# Patient Record
Sex: Male | Born: 1935 | Race: White | Hispanic: No | Smoking: Former smoker
Health system: Southern US, Community
[De-identification: ages and names within clinical notes are randomized; demographics above are authoritative.]

## PROBLEM LIST (undated history)

## (undated) DIAGNOSIS — N189 Chronic kidney disease, unspecified: Secondary | ICD-10-CM

## (undated) DIAGNOSIS — R001 Bradycardia, unspecified: Secondary | ICD-10-CM

## (undated) DIAGNOSIS — N200 Calculus of kidney: Secondary | ICD-10-CM

## (undated) DIAGNOSIS — F329 Major depressive disorder, single episode, unspecified: Secondary | ICD-10-CM

## (undated) DIAGNOSIS — N289 Disorder of kidney and ureter, unspecified: Secondary | ICD-10-CM

## (undated) DIAGNOSIS — C801 Malignant (primary) neoplasm, unspecified: Secondary | ICD-10-CM

## (undated) DIAGNOSIS — Z936 Other artificial openings of urinary tract status: Secondary | ICD-10-CM

## (undated) DIAGNOSIS — F32A Depression, unspecified: Secondary | ICD-10-CM

## (undated) DIAGNOSIS — C61 Malignant neoplasm of prostate: Secondary | ICD-10-CM

## (undated) DIAGNOSIS — I82409 Acute embolism and thrombosis of unspecified deep veins of unspecified lower extremity: Secondary | ICD-10-CM

## (undated) DIAGNOSIS — I10 Essential (primary) hypertension: Secondary | ICD-10-CM

## (undated) DIAGNOSIS — I350 Nonrheumatic aortic (valve) stenosis: Secondary | ICD-10-CM

## (undated) DIAGNOSIS — H3530 Unspecified macular degeneration: Secondary | ICD-10-CM

## (undated) HISTORY — DX: Acute embolism and thrombosis of unspecified deep veins of unspecified lower extremity: I82.409

## (undated) HISTORY — PX: CATARACT EXTRACTION, BILATERAL: SHX1313

## (undated) HISTORY — DX: Essential (primary) hypertension: I10

## (undated) HISTORY — DX: Chronic kidney disease, unspecified: N18.9

## (undated) HISTORY — PX: PROSTATE SURGERY: SHX751

## (undated) HISTORY — DX: Malignant neoplasm of prostate: C61

## (undated) HISTORY — PX: LYMPHADENECTOMY: SHX15

## (undated) HISTORY — DX: Major depressive disorder, single episode, unspecified: F32.9

## (undated) HISTORY — DX: Depression, unspecified: F32.A

## (undated) HISTORY — PX: OTHER SURGICAL HISTORY: SHX169

## (undated) HISTORY — PX: KIDNEY STONE SURGERY: SHX686

## (undated) HISTORY — DX: Nonrheumatic aortic (valve) stenosis: I35.0

## (undated) HISTORY — PX: CYSTECTOMY: SHX5119

## (undated) HISTORY — PX: APPENDECTOMY: SHX54

---

## 2004-12-25 ENCOUNTER — Encounter (INDEPENDENT_AMBULATORY_CARE_PROVIDER_SITE_OTHER): Payer: Self-pay | Admitting: Specialist

## 2004-12-27 ENCOUNTER — Inpatient Hospital Stay (HOSPITAL_COMMUNITY): Admission: RE | Admit: 2004-12-27 | Discharge: 2004-12-28 | Payer: Self-pay | Admitting: Urology

## 2005-01-30 ENCOUNTER — Inpatient Hospital Stay (HOSPITAL_COMMUNITY): Admission: RE | Admit: 2005-01-30 | Discharge: 2005-02-11 | Payer: Self-pay | Admitting: Urology

## 2005-01-30 ENCOUNTER — Encounter (INDEPENDENT_AMBULATORY_CARE_PROVIDER_SITE_OTHER): Payer: Self-pay | Admitting: Specialist

## 2005-06-04 ENCOUNTER — Ambulatory Visit (HOSPITAL_COMMUNITY): Admission: RE | Admit: 2005-06-04 | Discharge: 2005-06-04 | Payer: Self-pay | Admitting: Urology

## 2005-12-18 ENCOUNTER — Ambulatory Visit (HOSPITAL_COMMUNITY): Admission: RE | Admit: 2005-12-18 | Discharge: 2005-12-18 | Payer: Self-pay | Admitting: Urology

## 2006-06-25 ENCOUNTER — Ambulatory Visit (HOSPITAL_COMMUNITY): Admission: RE | Admit: 2006-06-25 | Discharge: 2006-06-25 | Payer: Self-pay | Admitting: Urology

## 2006-10-12 IMAGING — CR DG CHEST 2V
2 series · 2 of 2 positions shown · non-contrast
Comparison: None.

CLINICAL DATA: 83-year-old, bladder tumor, preoperative respiratory exam. 
 CHEST ? 2 VIEW:

[view not recorded (1 of 2)]
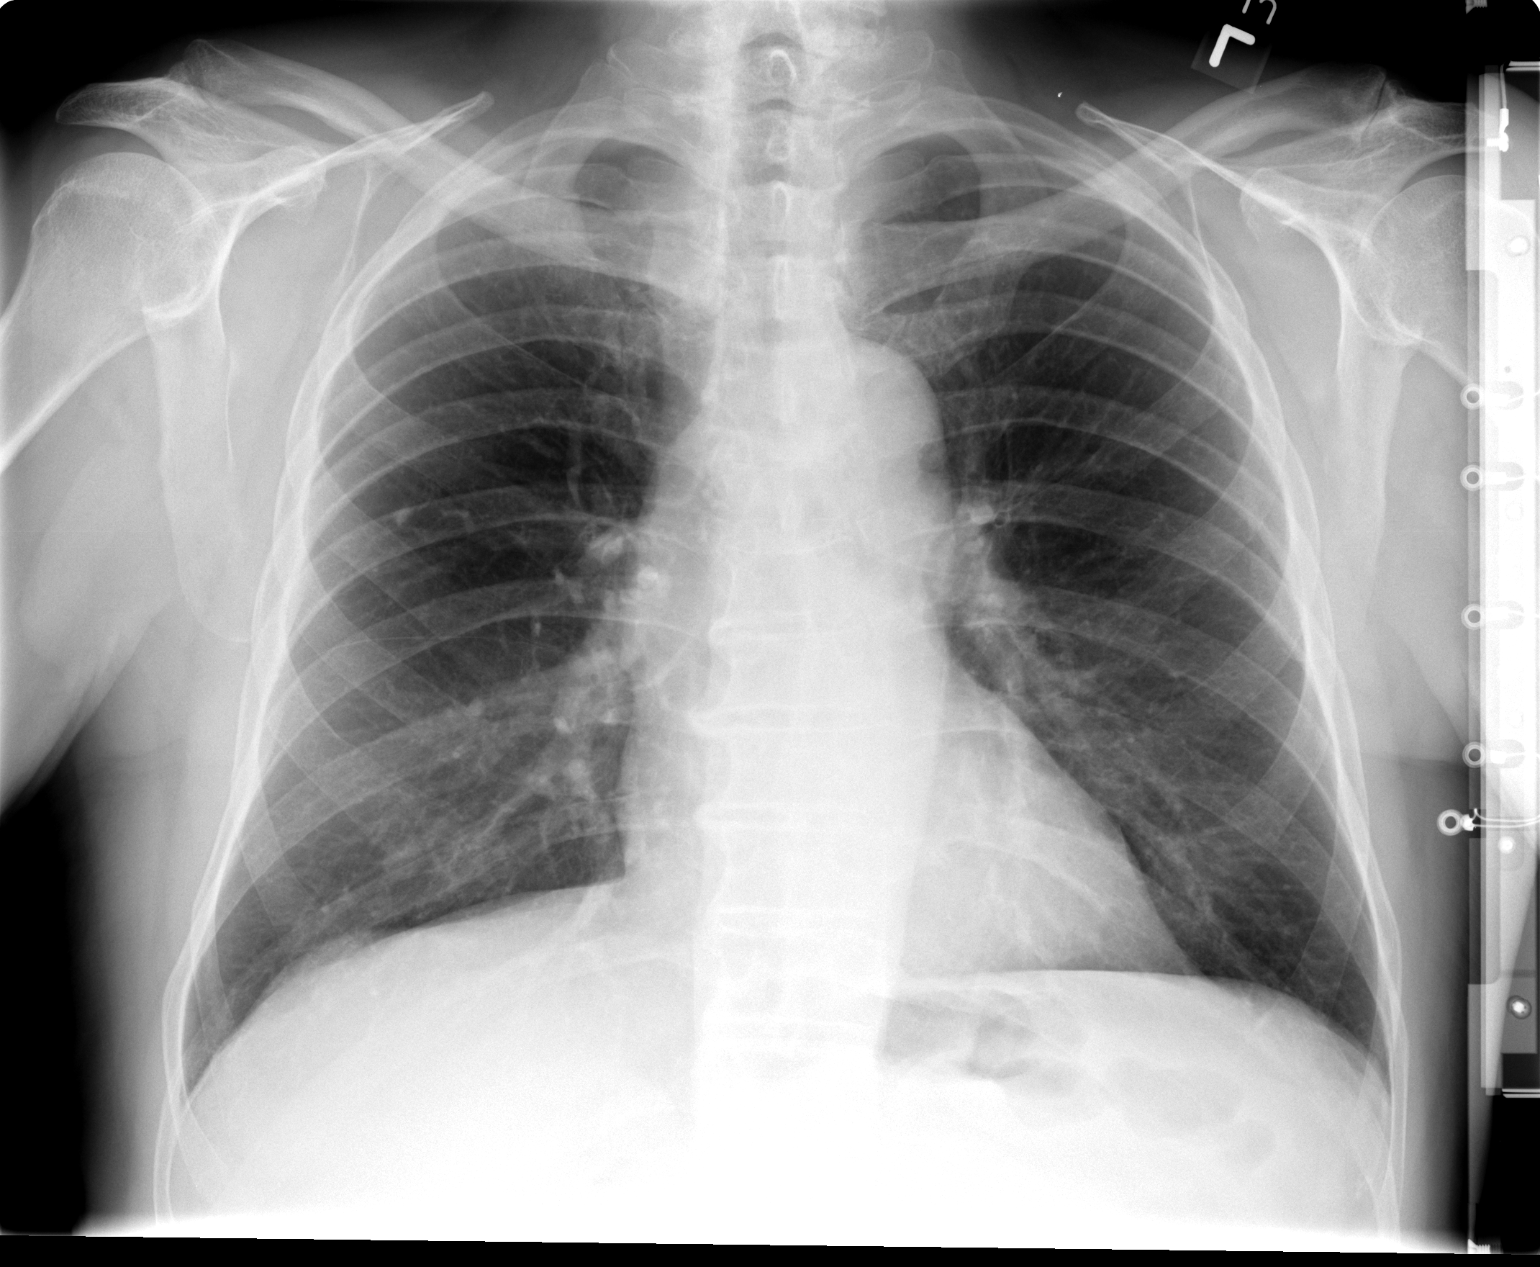

[view not recorded (2 of 2)]
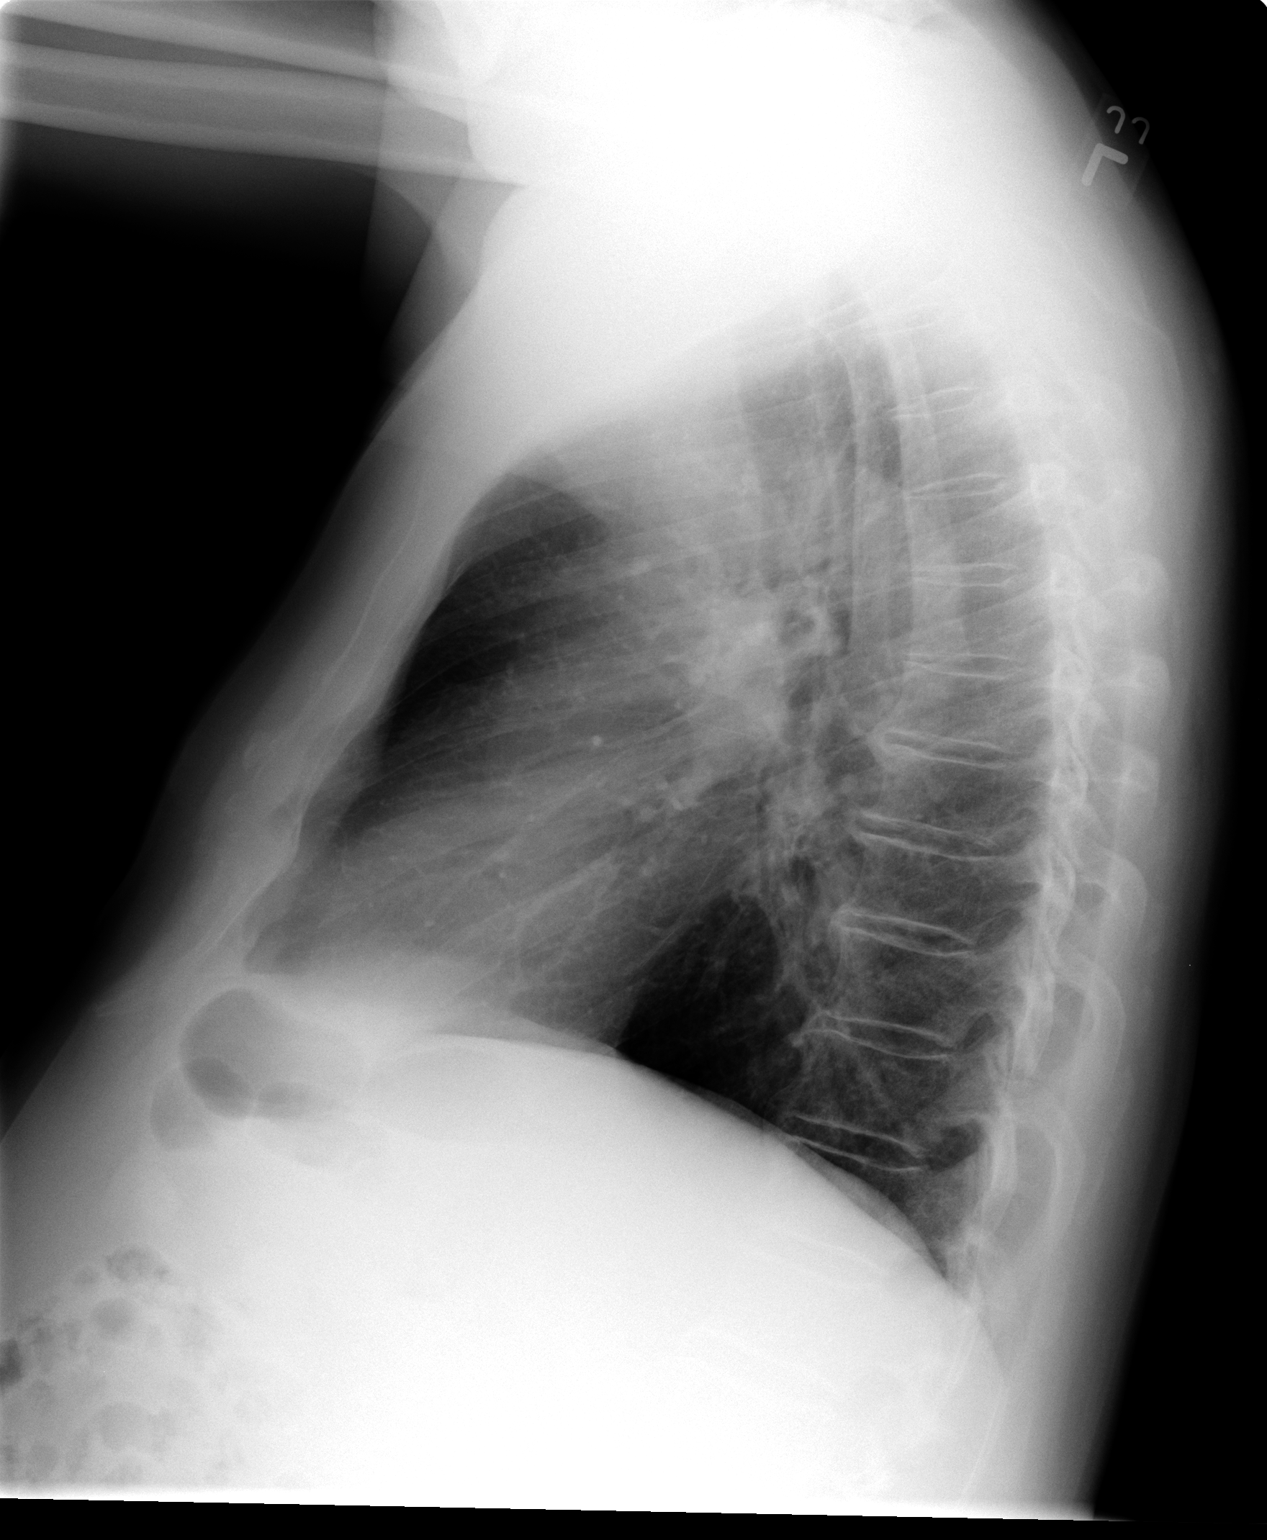

[2 of 2 positions shown; findings below may reference images not displayed]

FINDINGS: The cardiac silhouette, mediastinal contours are within normal limits.  There is mild tortuosity and ectasia of the thoracic aorta.  There are linear scarring type changes in the right mid lung along with mild right basilar scarring changes.  No infiltrates, edema or effusions.  No worrisome pulmonary masses.  Mild degenerative changes in the thoracic spine and involving the AC joints.  Rounded nodular densities in both lower lung zones are symmetric in appearance and position and are consistent with nipple shadows.
IMPRESSION: No acute cardiopulmonary findings.  No worrisome pulmonary lesions.  Right mid lung and right basilar scarring type changes.

## 2006-11-18 IMAGING — CR DG CHEST 1V PORT
1 series · 1 of 1 positions shown · non-contrast
Comparison: Chest radiograph 12/24/04.

CLINICAL DATA: Central line placement, bladder cancer.
 PORTABLE [id number] VIEW:

[view not recorded]
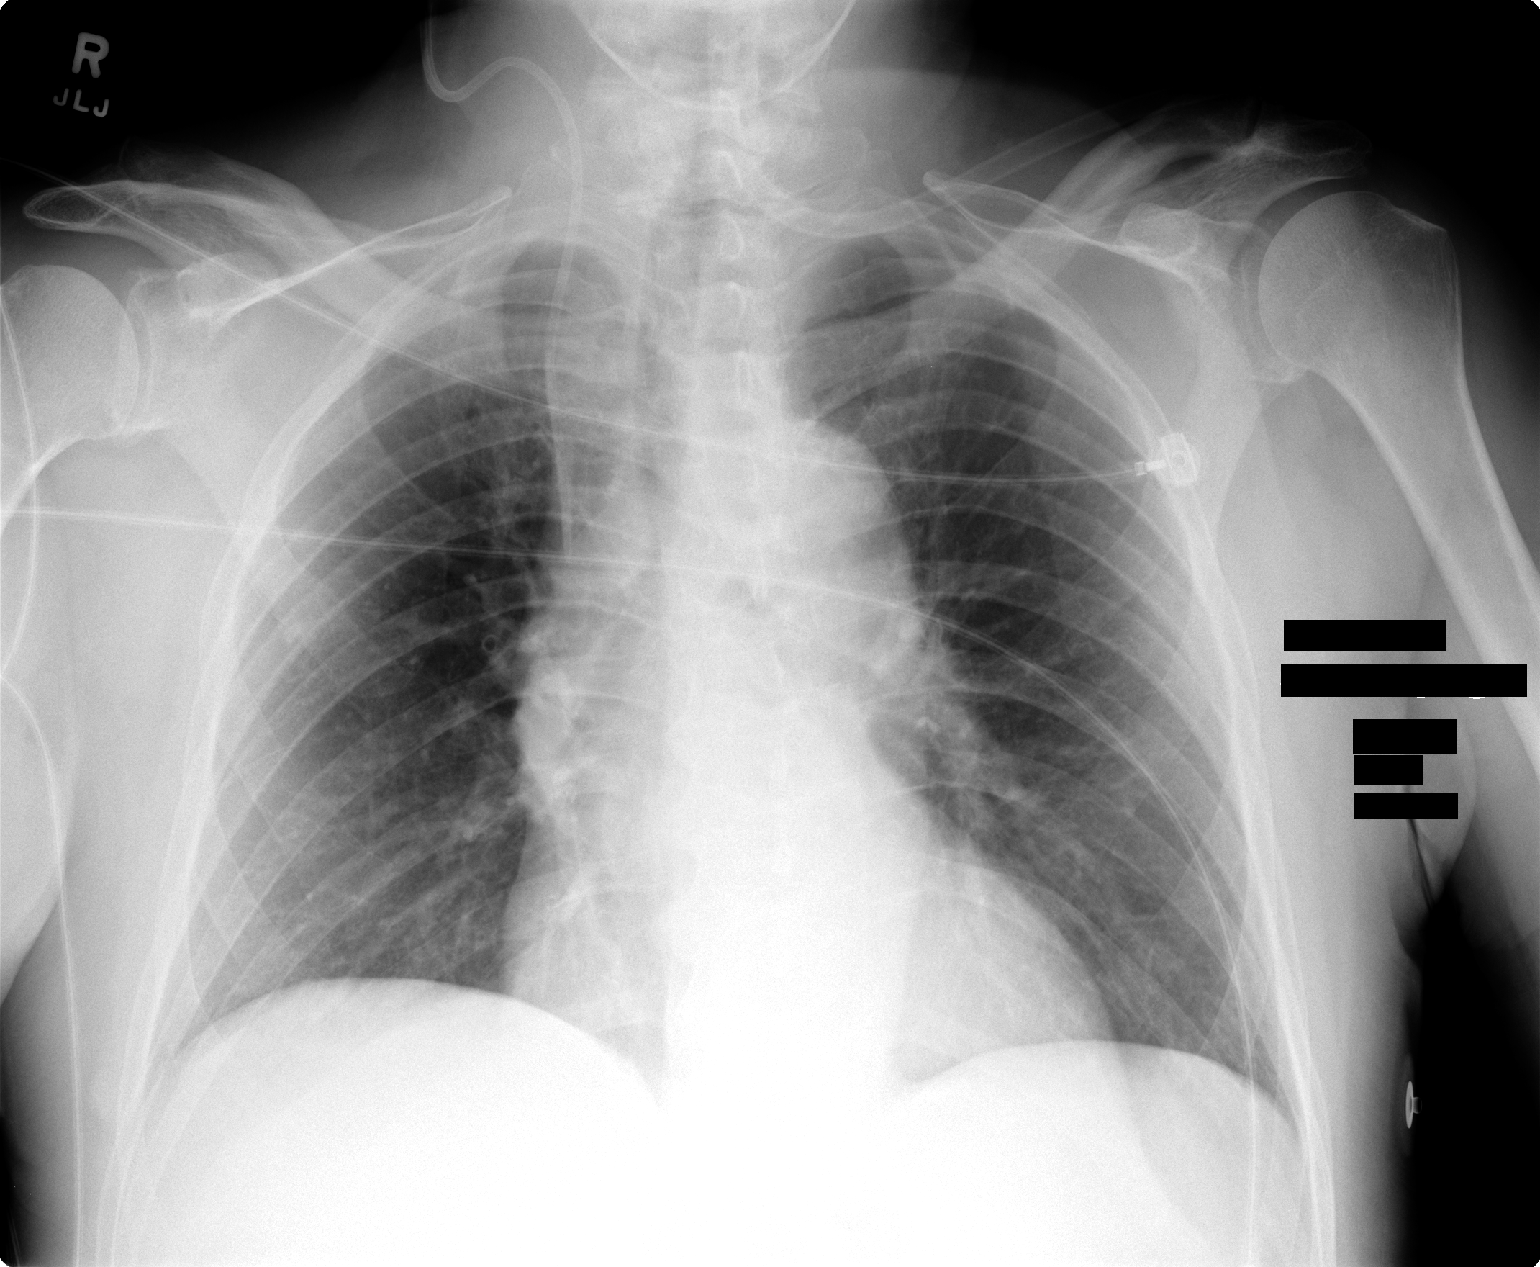

[1 of 1 positions shown; findings below may reference images not displayed]

FINDINGS: There has been interval placement of a right IJ central venous catheter terminating over the SVC.  No acute finding is otherwise identified.  No change.
IMPRESSION: No pneumothorax status post right IJ central line placement with tip over SVC.

## 2006-11-26 IMAGING — CR DG CHEST 1V PORT
1 series · 1 of 1 positions shown · non-contrast
Comparison: Portable chest x-ray 01/30/2005.

CLINICAL DATA: Bedside PICC placement. History of bladder cancer.

PORTABLE CHEST - 1 VIEW  [DATE]/[id number] hours:

[view not recorded]
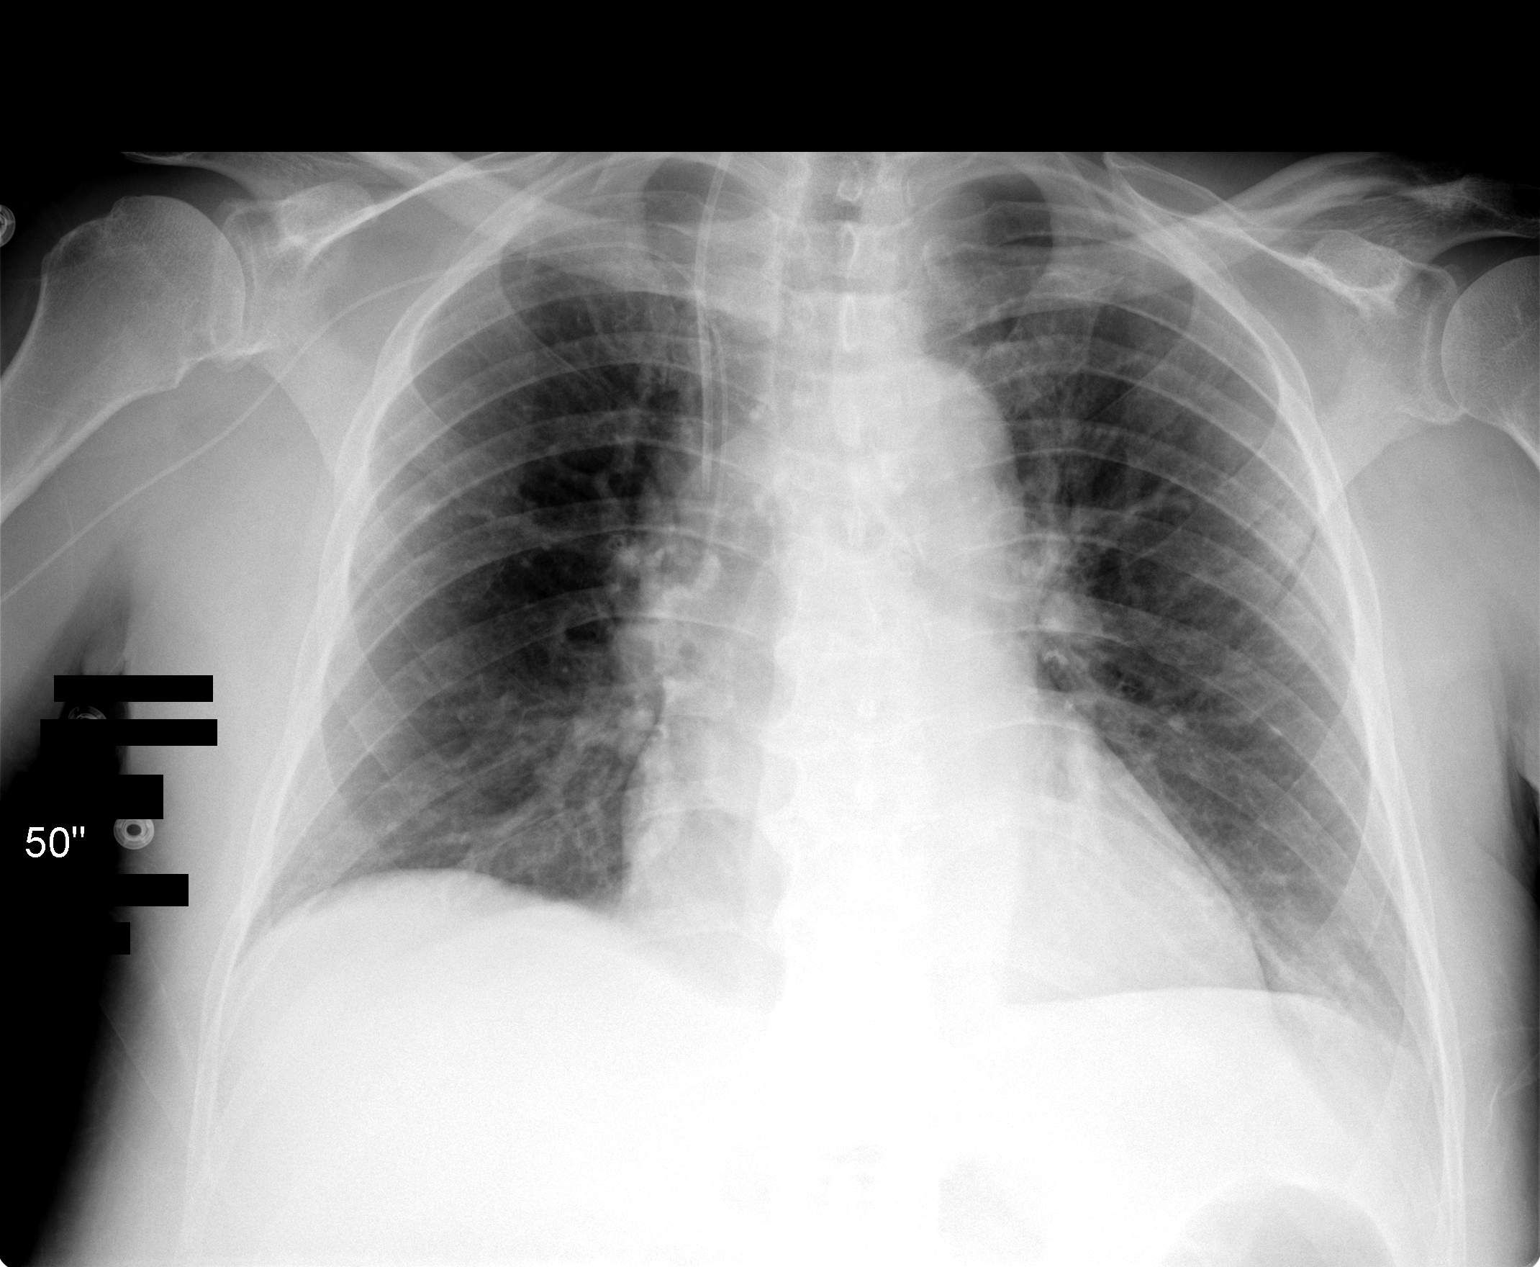

[1 of 1 positions shown; findings below may reference images not displayed]

FINDINGS: The right arm PICC has its tip in the lower SVC. The right jugular
central venous catheter tip remains in the upper SVC. Mild atelectasis is
present in the lung bases. The lungs are otherwise clear. The heart size is
upper normal to perhaps slightly enlarged but stable.
IMPRESSION: 1. Right arm PICC tip in the lower SVC.
2. Mild bibasilar atelectasis.

## 2006-12-30 ENCOUNTER — Ambulatory Visit (HOSPITAL_COMMUNITY): Admission: RE | Admit: 2006-12-30 | Discharge: 2006-12-30 | Payer: Self-pay | Admitting: Urology

## 2007-03-26 HISTORY — PX: PACEMAKER INSERTION: SHX728

## 2007-08-29 ENCOUNTER — Inpatient Hospital Stay (HOSPITAL_COMMUNITY): Admission: EM | Admit: 2007-08-29 | Discharge: 2007-09-06 | Payer: Self-pay | Admitting: Emergency Medicine

## 2007-10-06 IMAGING — CR DG CHEST 2V
2 series · 2 of 2 positions shown · non-contrast
Comparison: 06/04/05

CLINICAL DATA: History of bladder cancer.  
 NBEHG-P VIEWS:

[view not recorded (1 of 2)]
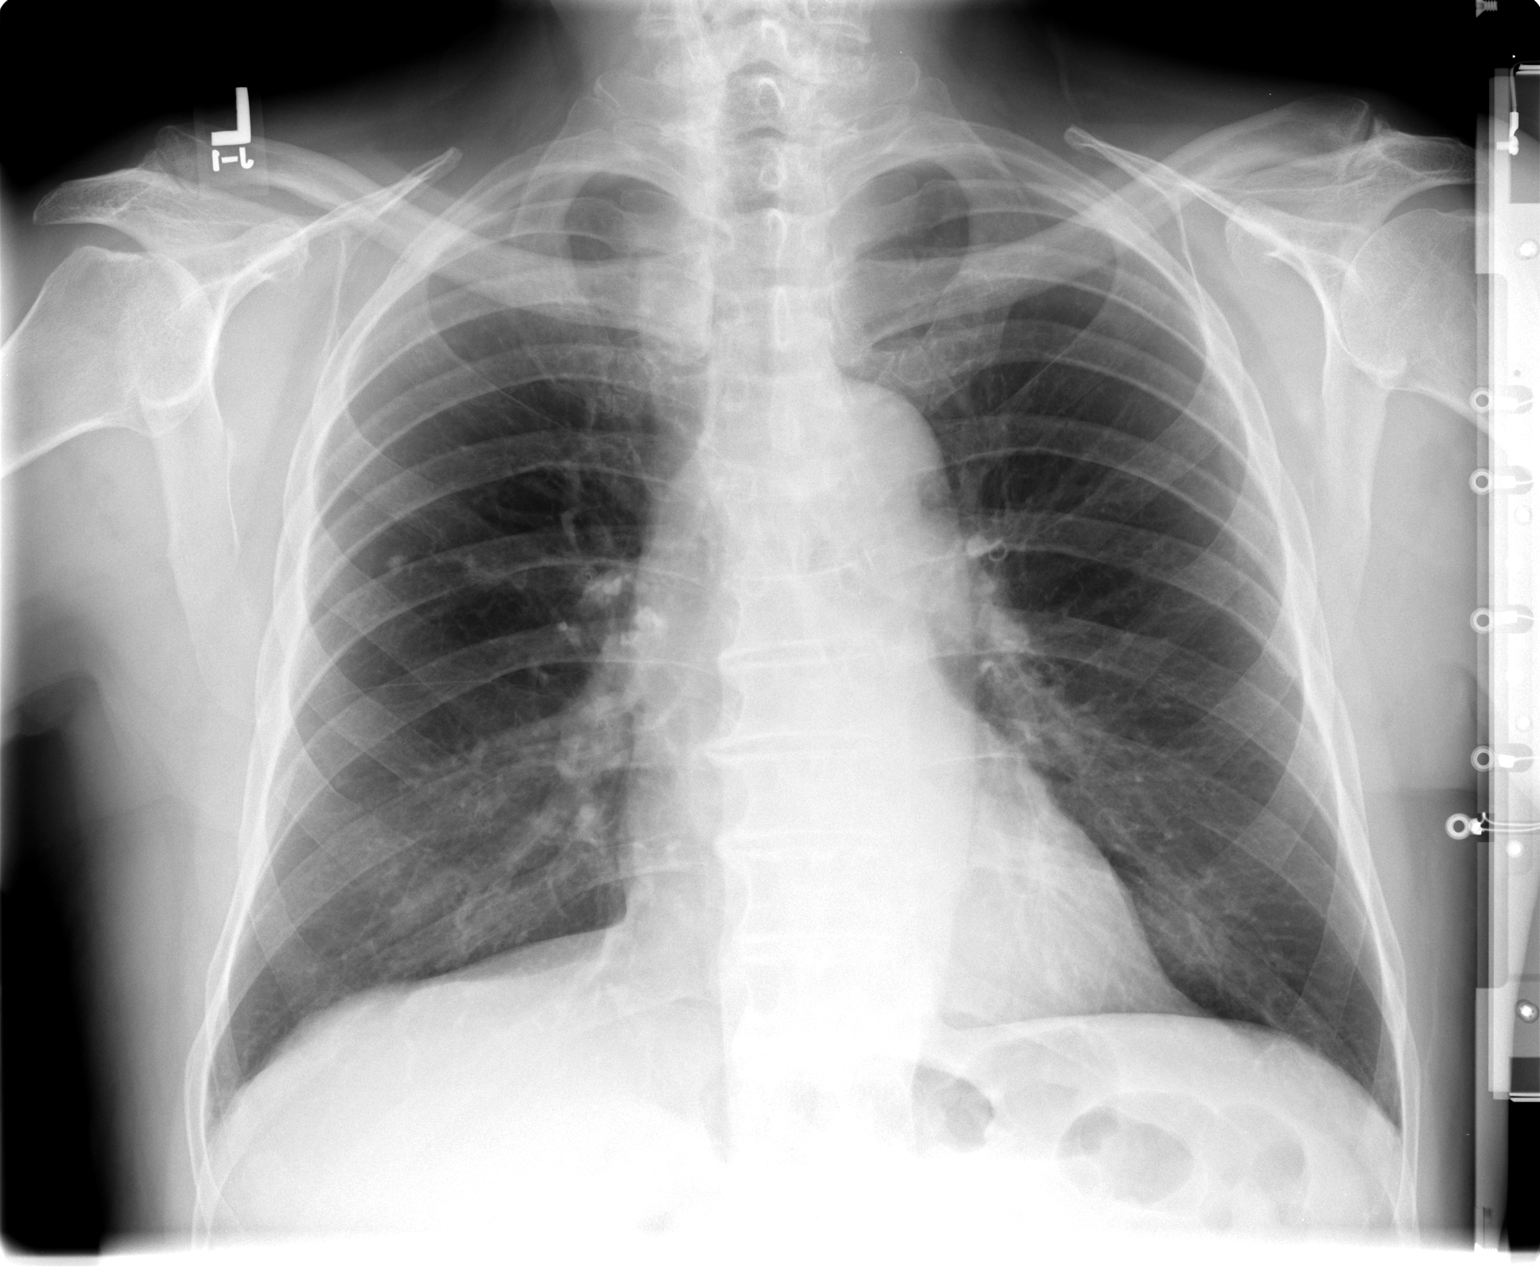

[view not recorded (2 of 2)]
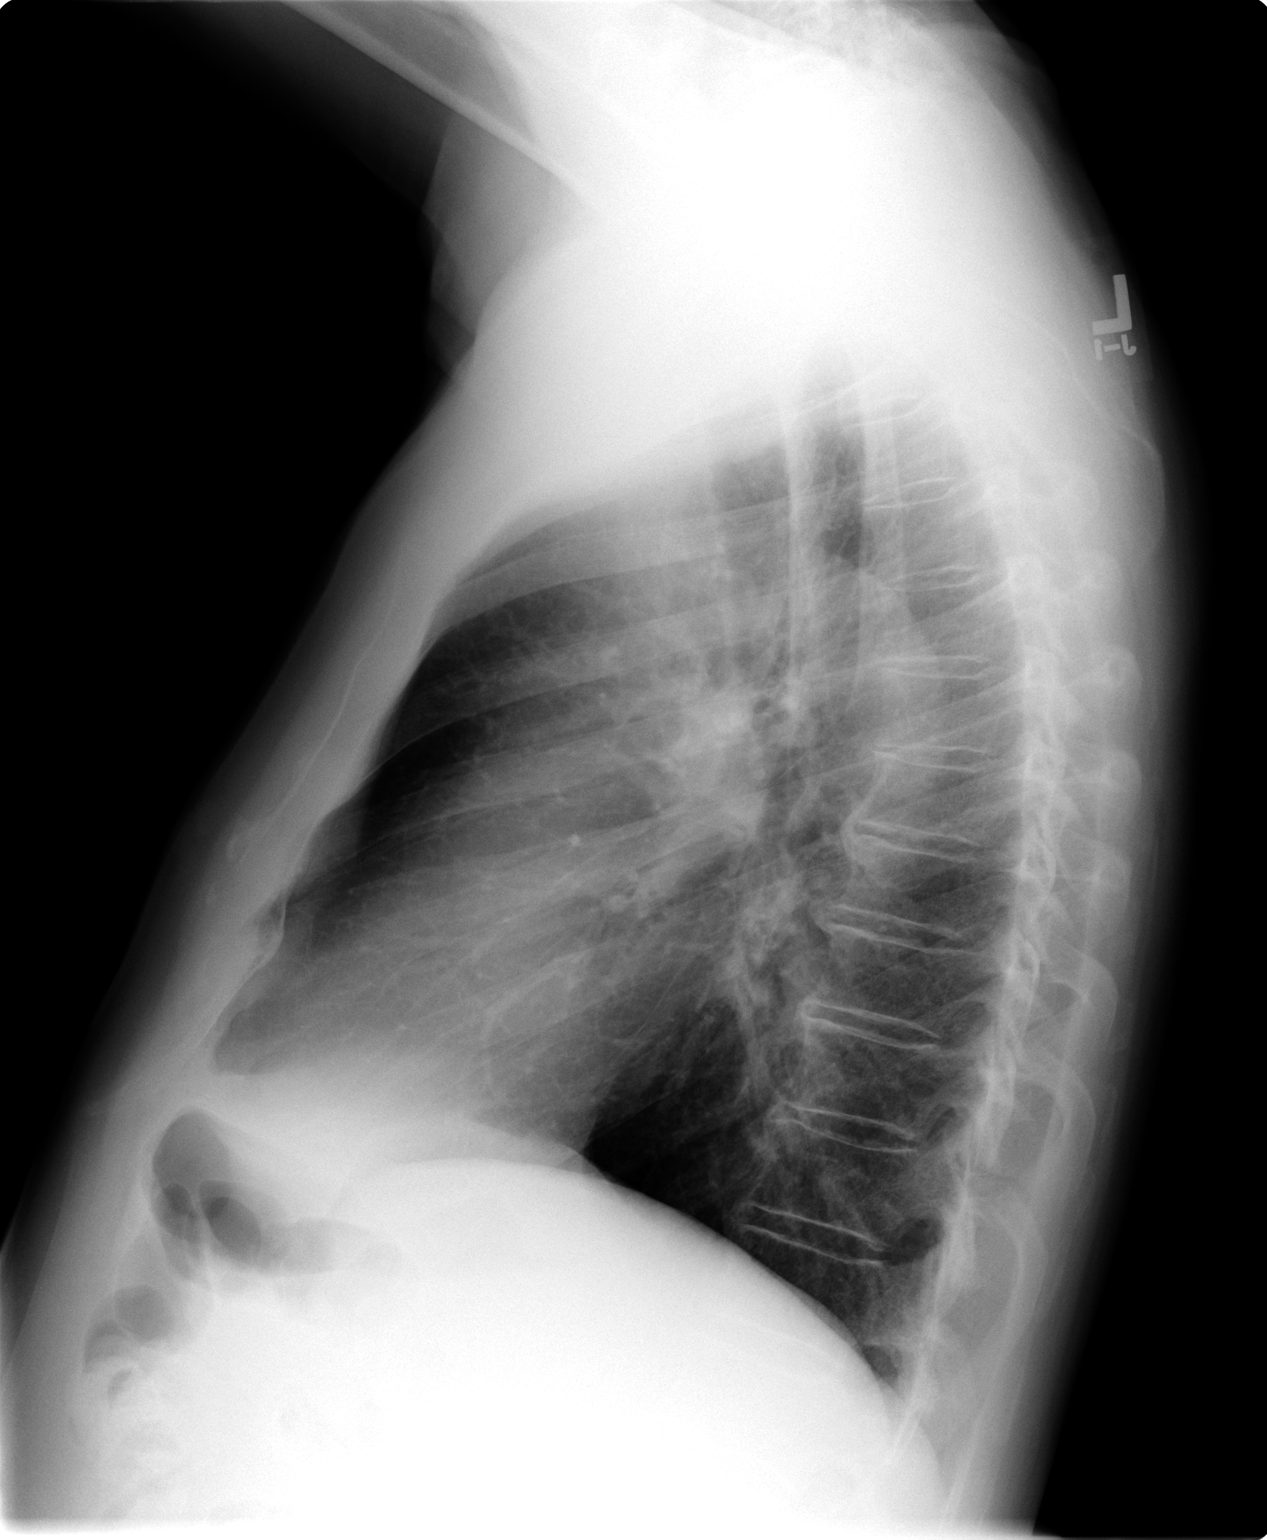

[2 of 2 positions shown; findings below may reference images not displayed]

FINDINGS: Calcified granuloma is again seen in the right midlung with calcified right hilar nodes also noted.  Somewhat prominent nipple shadows are also again noted as seen on study 12/24/04.  Lungs are otherwise unremarkable.  There is no pleural effusion.  Heart size is normal.
IMPRESSION: No acute disease with evidence of old granulomatous disease noted.

## 2007-12-10 ENCOUNTER — Ambulatory Visit (HOSPITAL_COMMUNITY): Admission: RE | Admit: 2007-12-10 | Discharge: 2007-12-11 | Payer: Self-pay | Admitting: *Deleted

## 2007-12-22 ENCOUNTER — Ambulatory Visit: Payer: Self-pay | Admitting: *Deleted

## 2008-02-24 ENCOUNTER — Ambulatory Visit: Payer: Self-pay | Admitting: Vascular Surgery

## 2008-04-04 ENCOUNTER — Ambulatory Visit (HOSPITAL_COMMUNITY): Admission: RE | Admit: 2008-04-04 | Discharge: 2008-04-04 | Payer: Self-pay | Admitting: Urology

## 2008-04-12 IMAGING — CR DG CHEST 2V
2 series · 2 of 2 positions shown · non-contrast
Comparison: 12/18/05.

CLINICAL DATA: Bladder CA. 
 CHEST ? 2 VIEW:

[w chest pa]
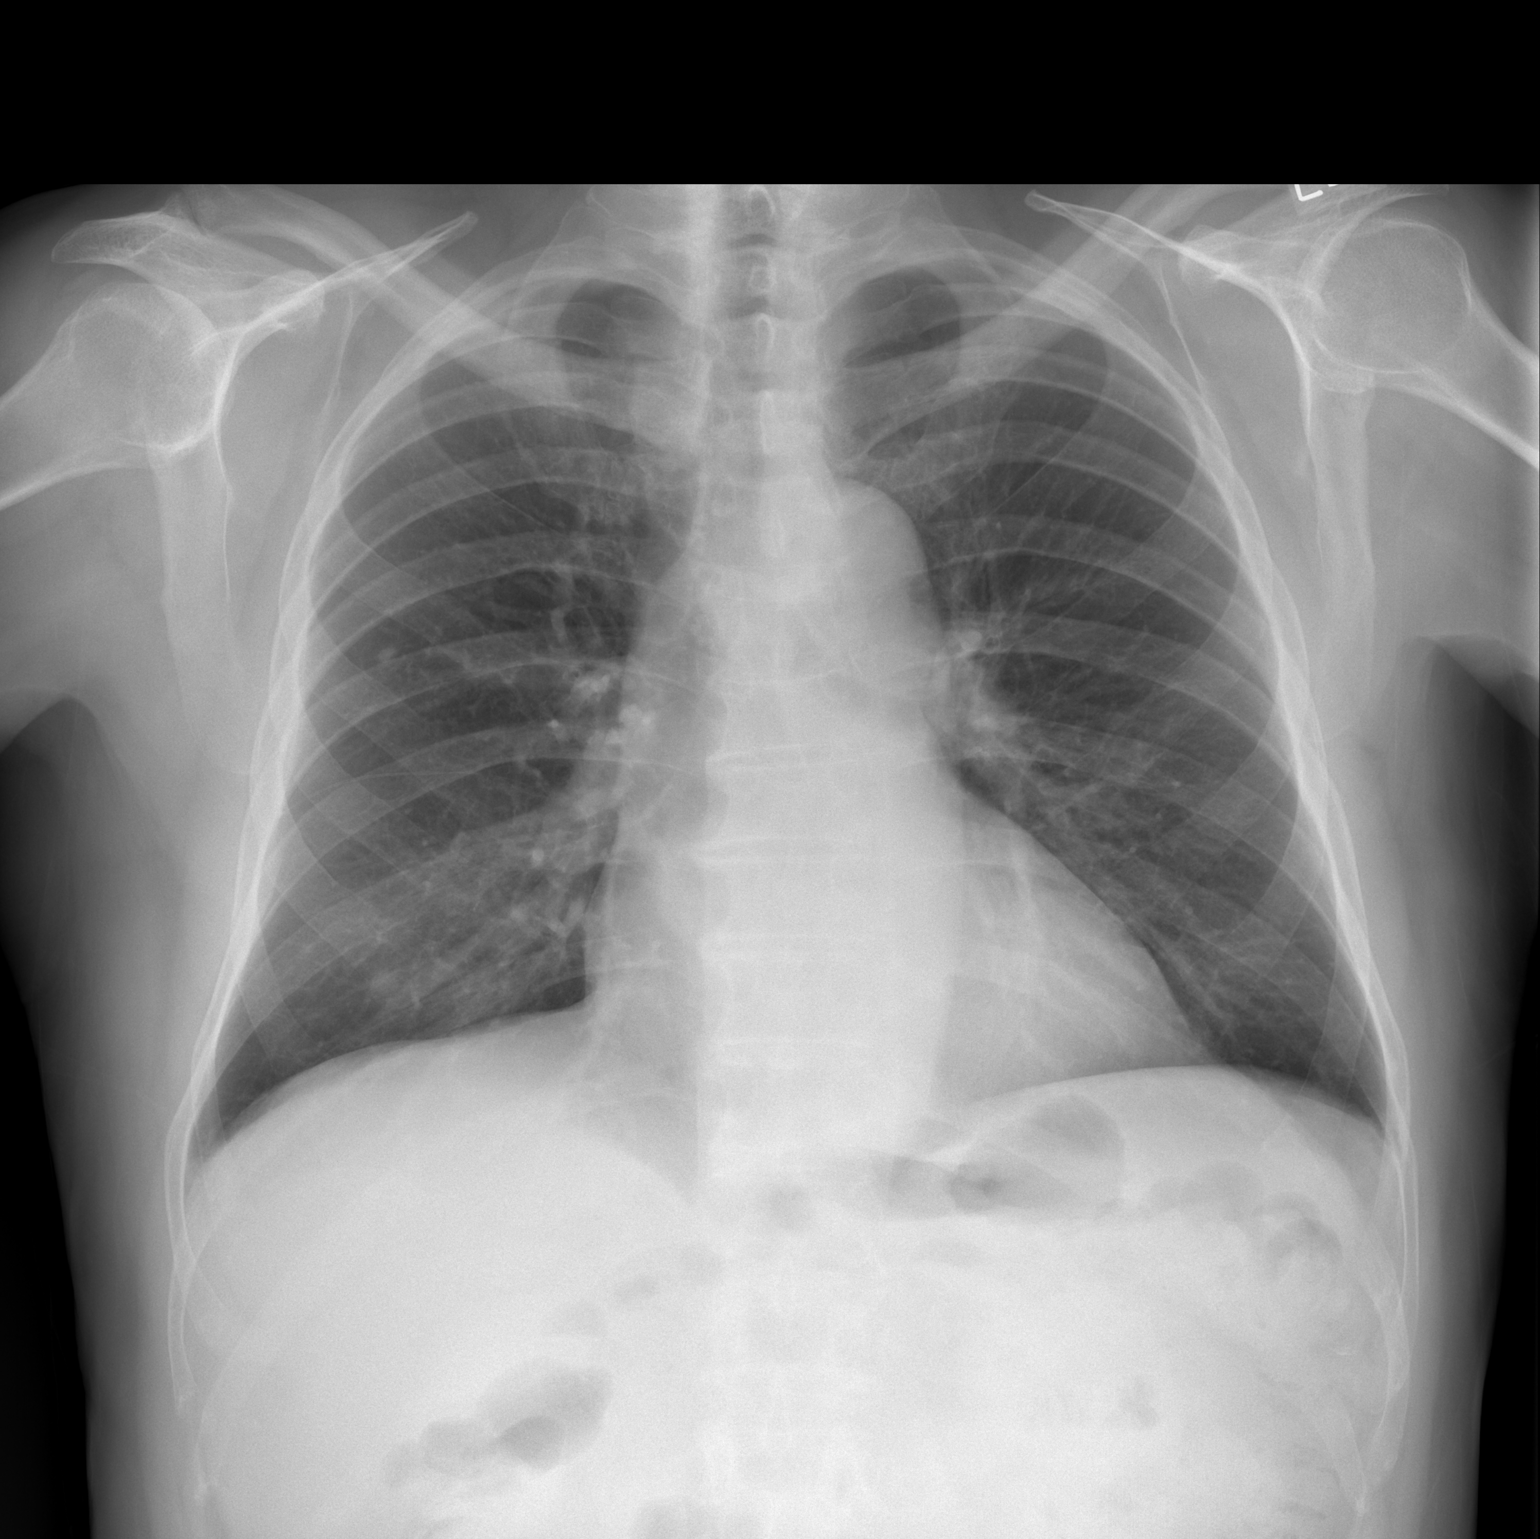

[w chest lat]
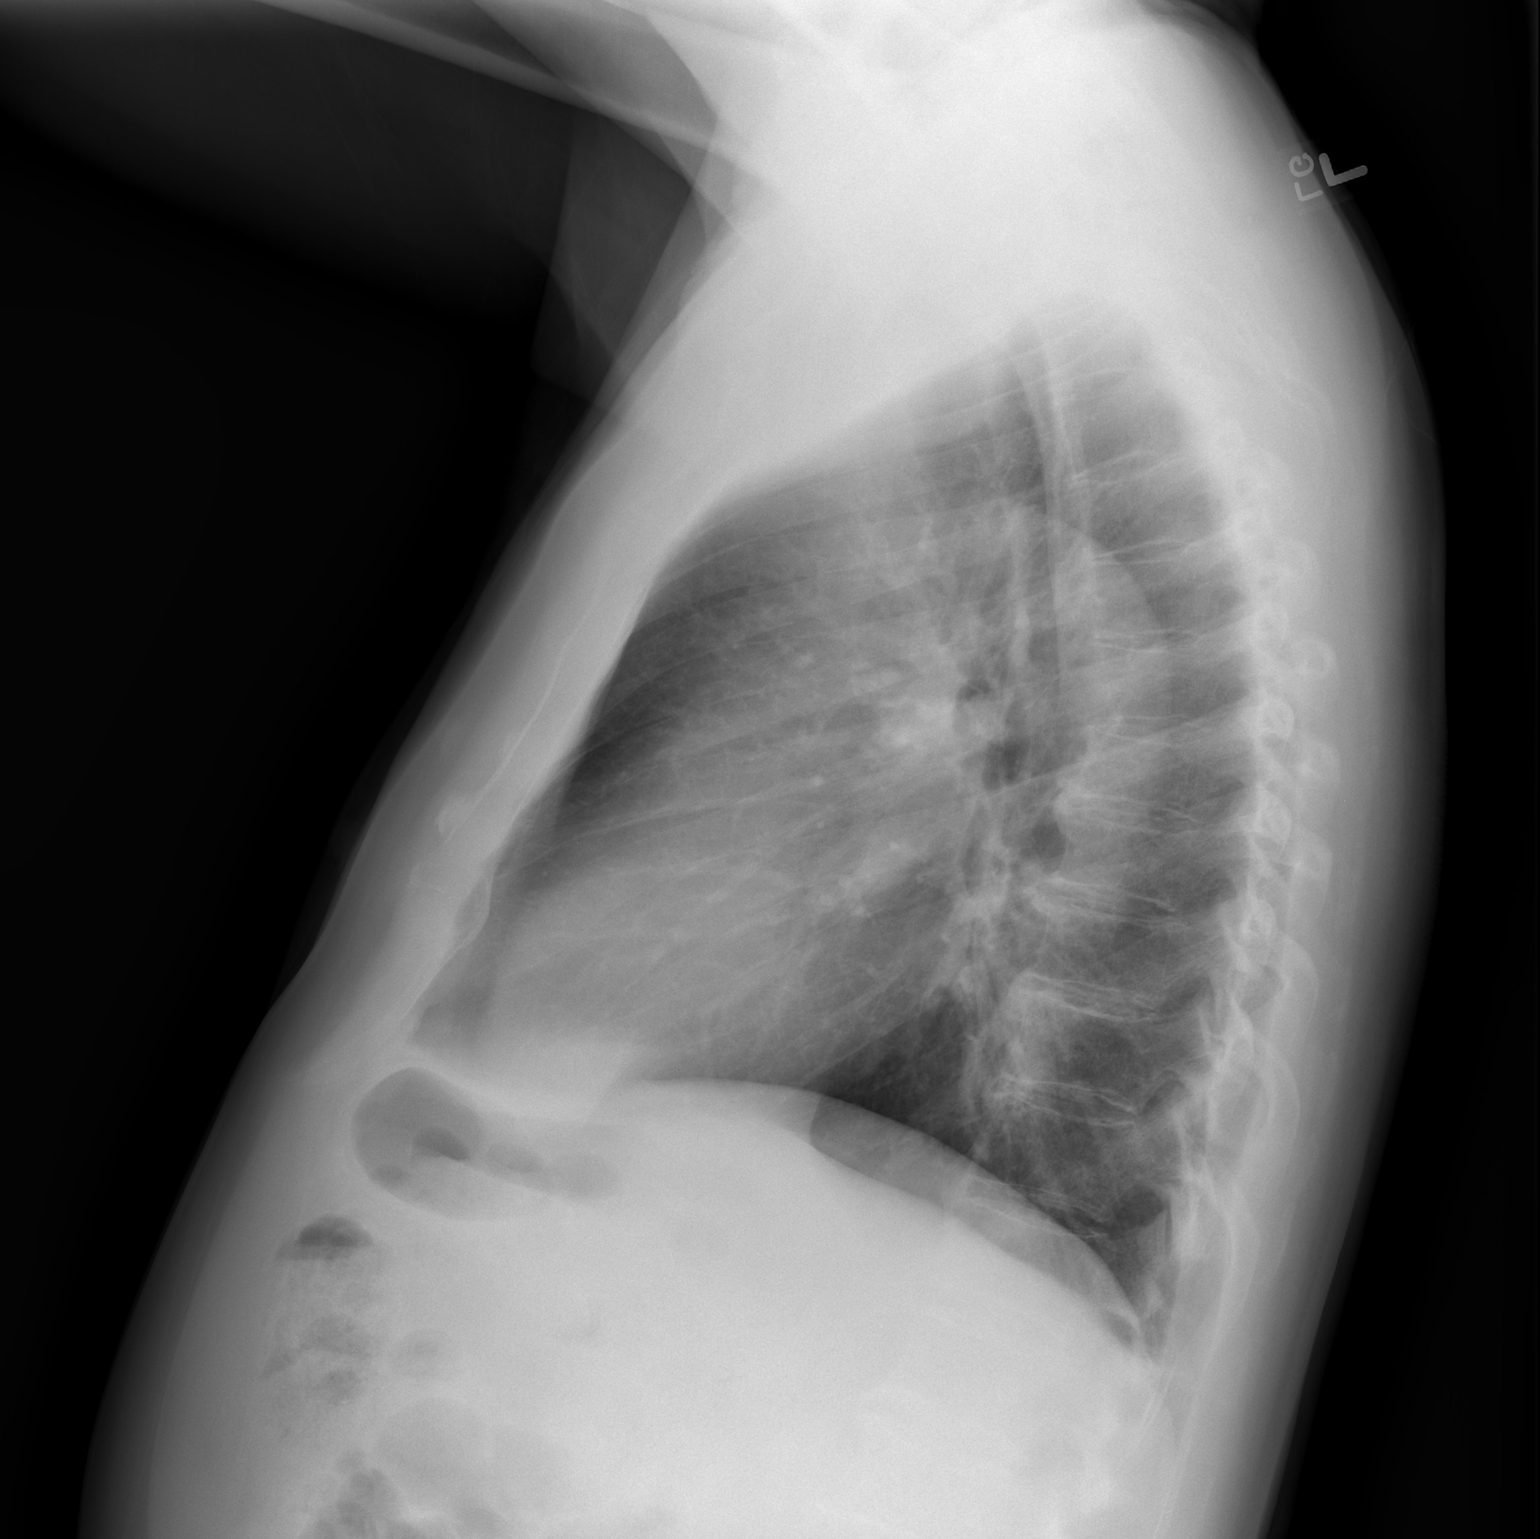

[2 of 2 positions shown; findings below may reference images not displayed]

FINDINGS: The heart size and mediastinal contours are within normal limits.  Both lungs are clear.  The visualized skeletal structures are unremarkable.  There are calcifications in the right perihilar region and right midlung zones secondary to old granulomatous disease.
IMPRESSION: No active chest disease.  Old granulomatous disease.

## 2008-06-03 ENCOUNTER — Ambulatory Visit: Payer: Self-pay | Admitting: Vascular Surgery

## 2008-10-03 ENCOUNTER — Ambulatory Visit: Payer: Self-pay | Admitting: Vascular Surgery

## 2008-10-17 IMAGING — CR DG CHEST 2V
2 series · 2 of 2 positions shown · non-contrast
Comparison: none

CLINICAL DATA: 85-year-old male with bladder cancer. Preop for surgery. 
 CHEST - 2 VIEW:

[w chest pa]
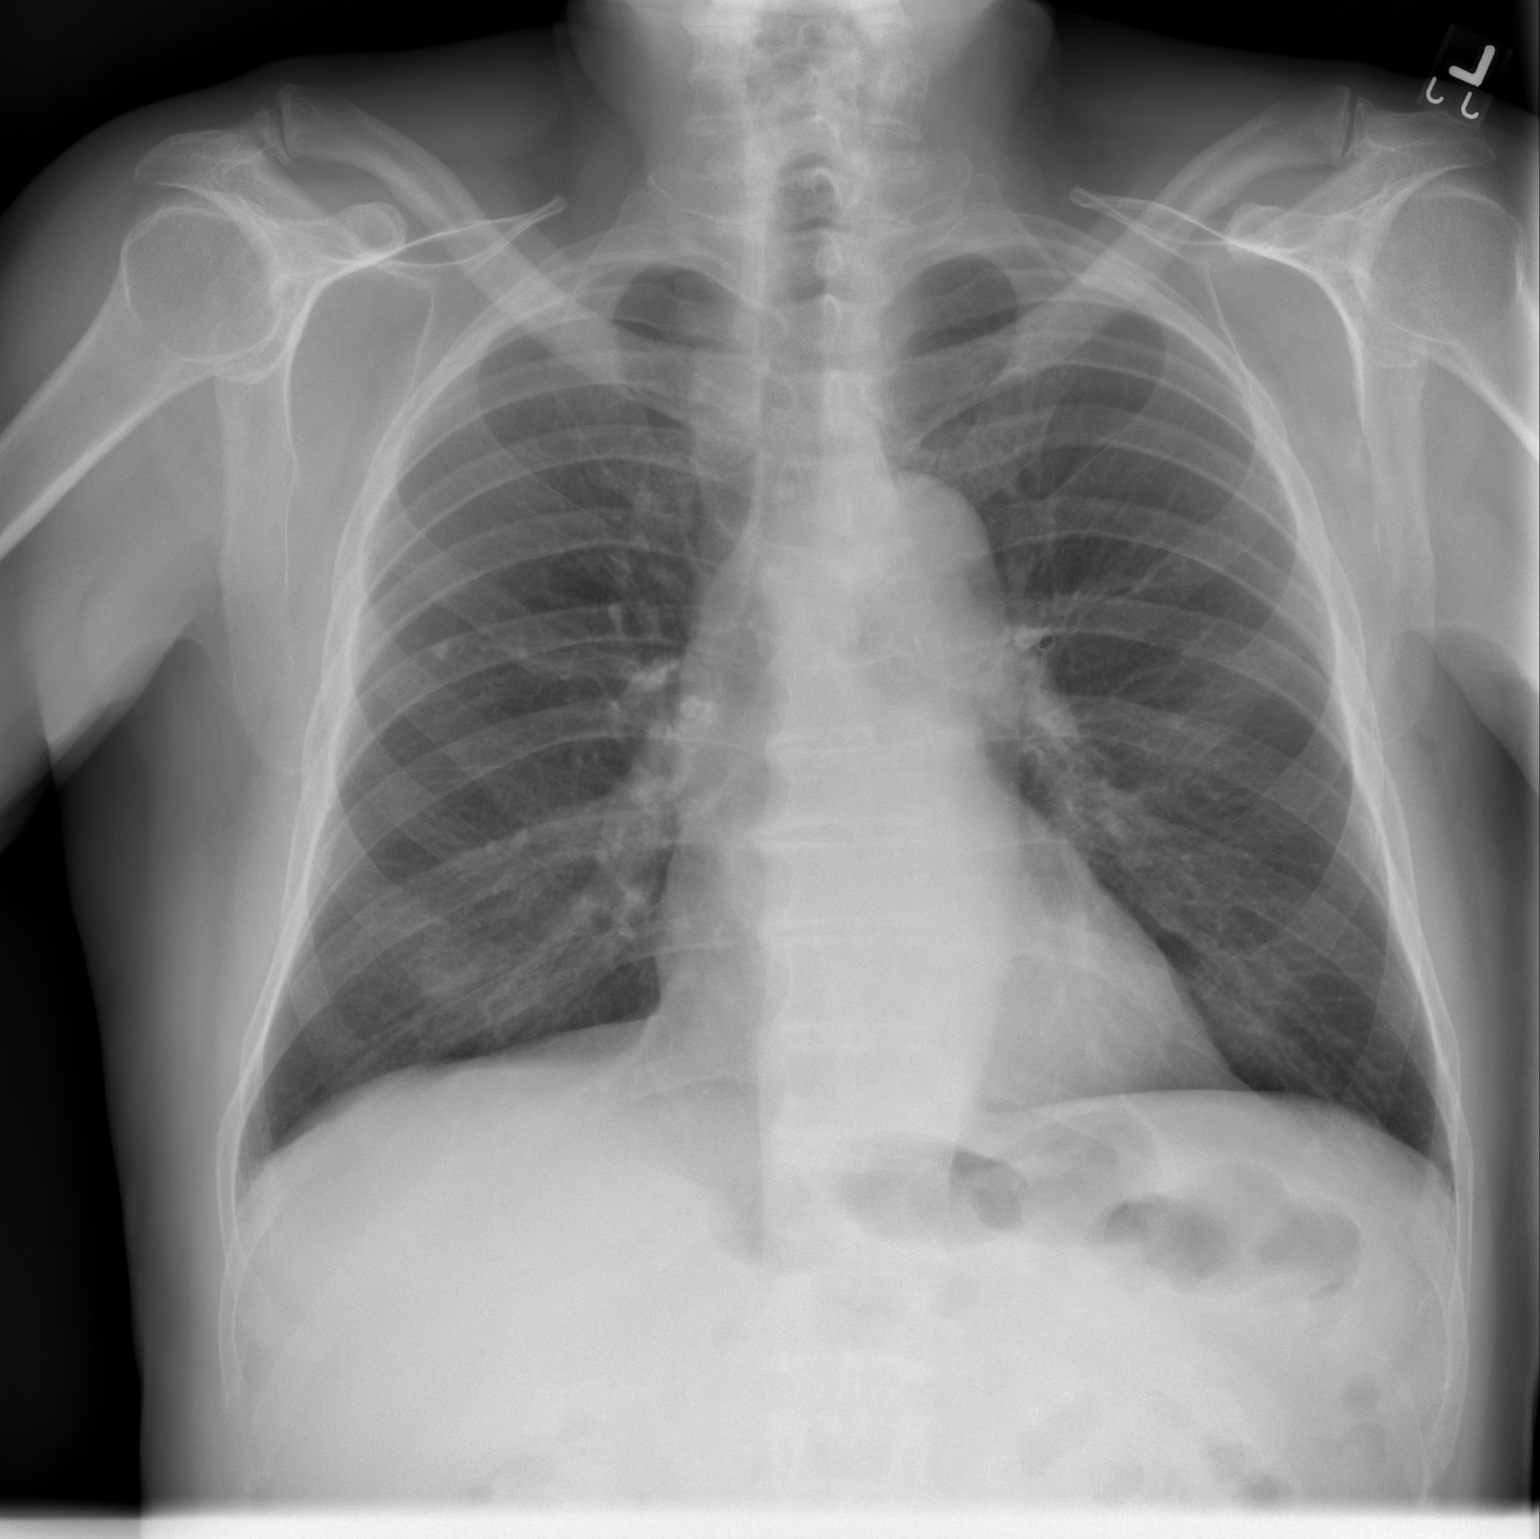

[w chest lat]
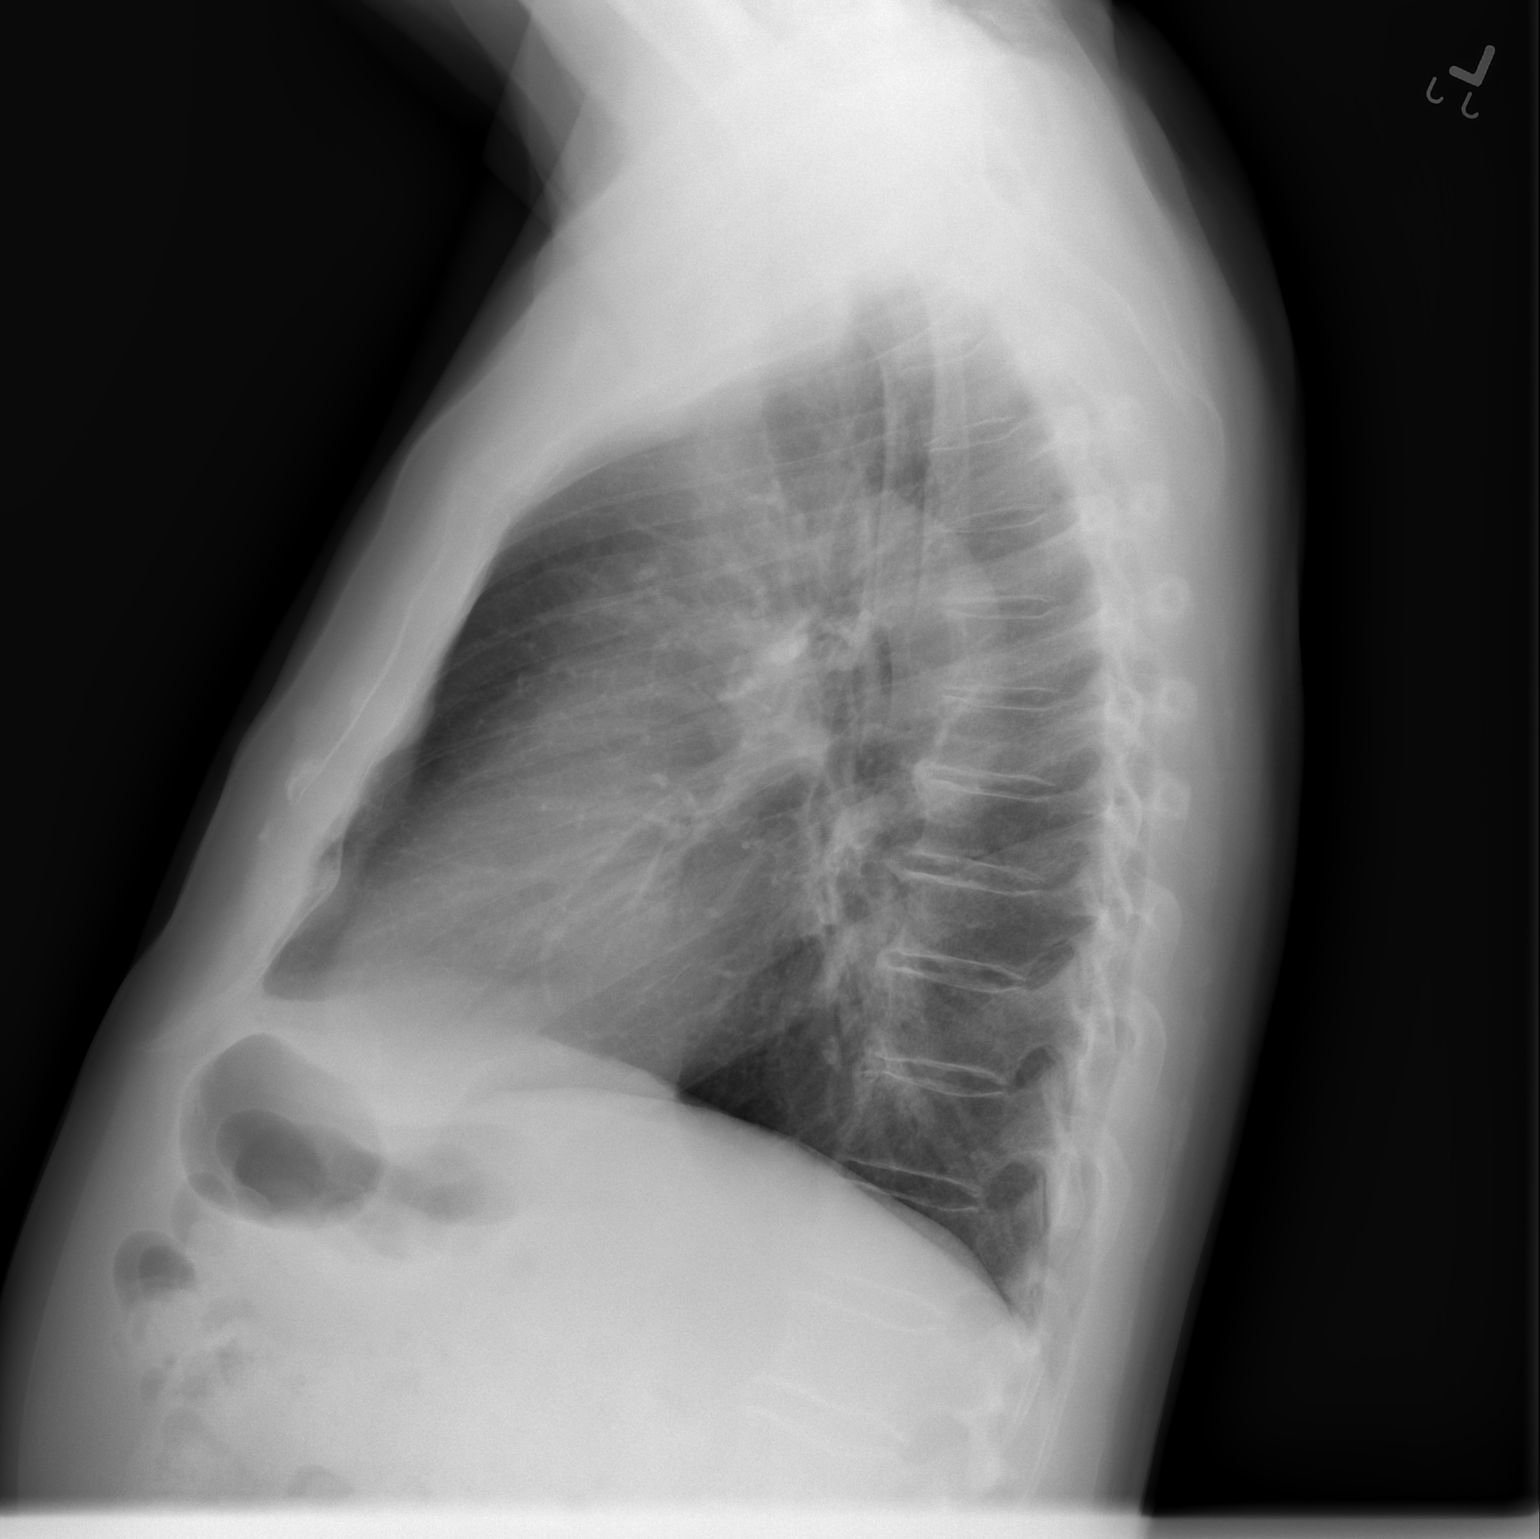

[2 of 2 positions shown; findings below may reference images not displayed]

FINDINGS: The cardiopericardial silhouette is within normal limits for size. The lungs are clear.  Granuloma of the right lung is stable. Mild degenerative changes of the AC joints are evident.
IMPRESSION: 1.   No acute cardiopulmonary disease. 
 2.  Stable granuloma right lung.

## 2009-05-15 ENCOUNTER — Ambulatory Visit (HOSPITAL_COMMUNITY): Admission: RE | Admit: 2009-05-15 | Discharge: 2009-05-15 | Payer: Self-pay | Admitting: Urology

## 2009-06-16 IMAGING — US US RENAL
1 series · 14 of 25 positions shown · non-contrast
Comparison: None.

CLINICAL DATA: Acute renal failure.  Dehydration.  Nausea and
vomiting for the past 3 weeks.  History of bladder and prostate
cancer.

RENAL/URINARY TRACT ULTRASOUND
TECHNIQUE: Complete ultrasound examination of the urinary tract
was performed including evaluation of the kidneys renal collecting
systems and urinary bladder.

[Series 1: unknown · 0.34mm/px · 14 of 51 slices shown]
[im 1/51]
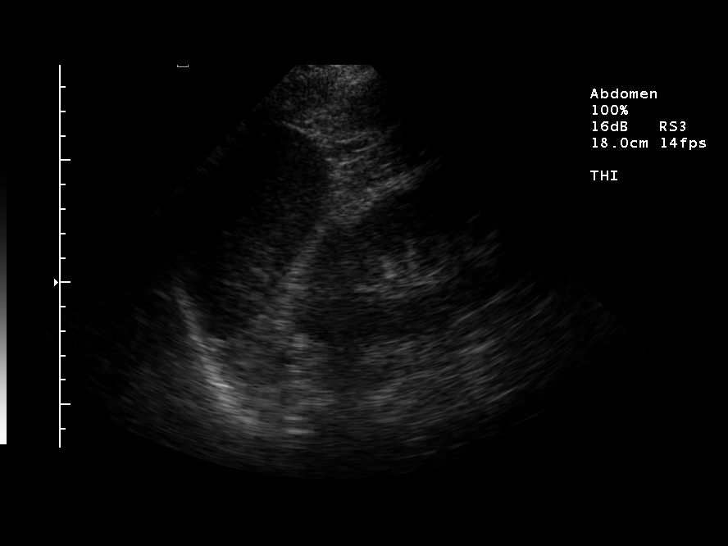
[im 5/51]
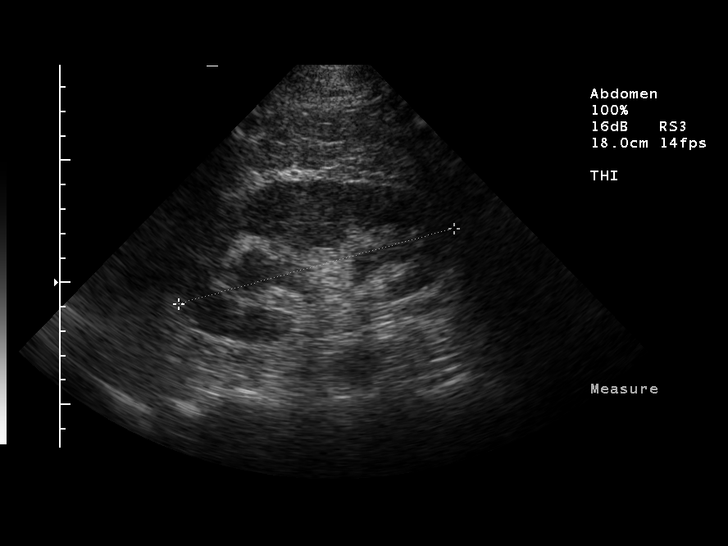
[im 9/51]
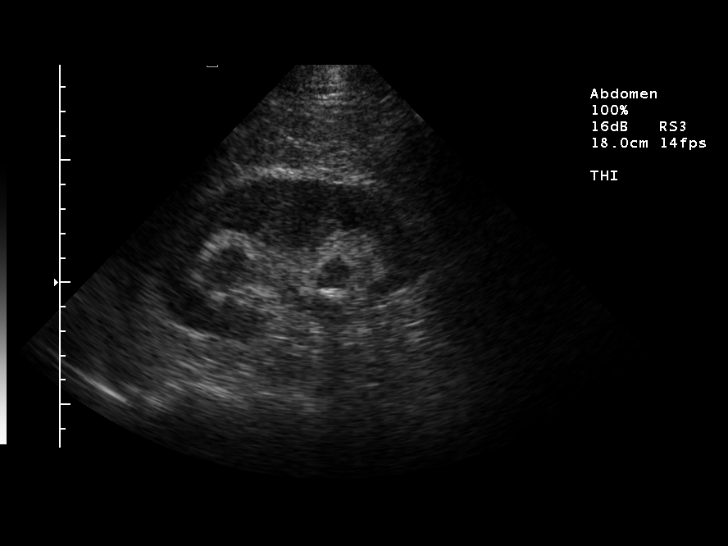
[im 13/51]
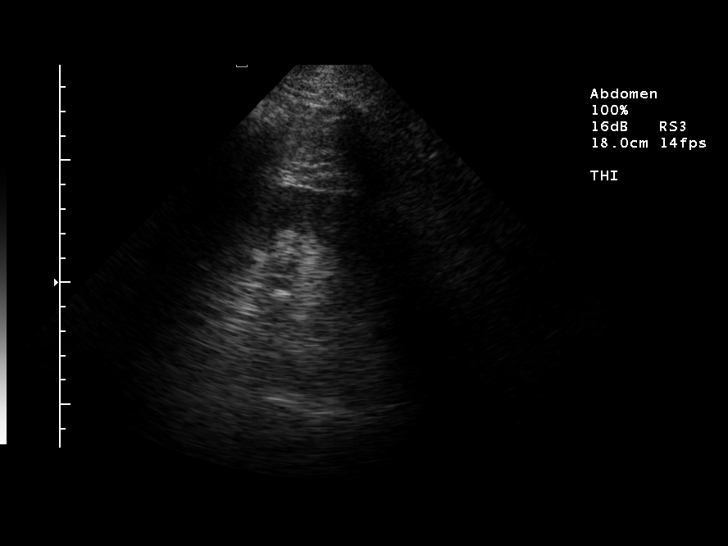
[im 17/51]
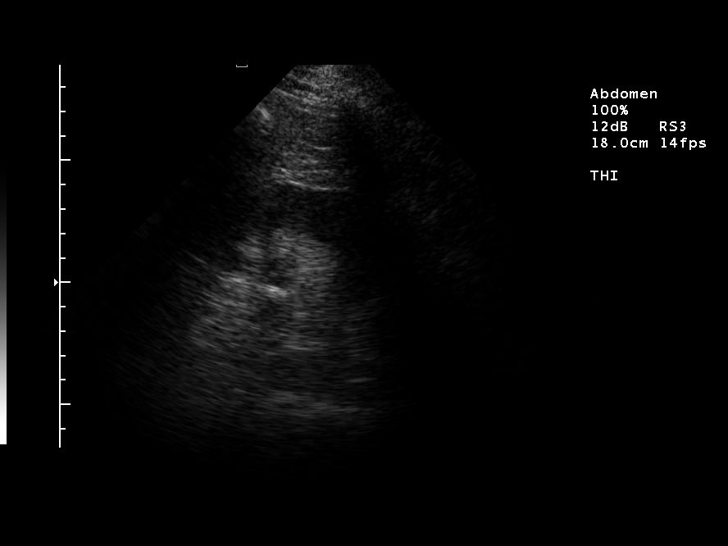
[im 19/51]
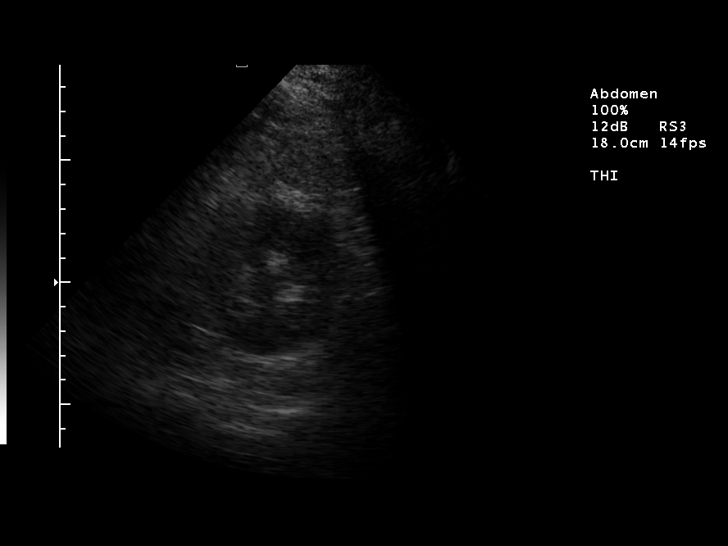
[im 23/51]
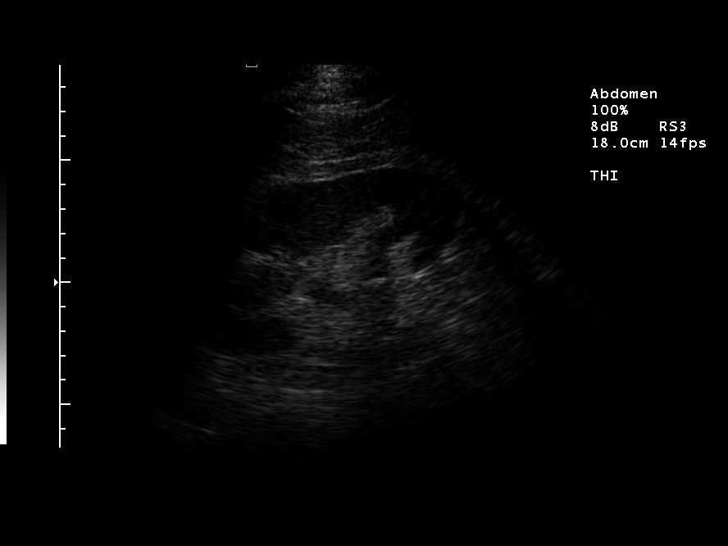
[im 28/51]
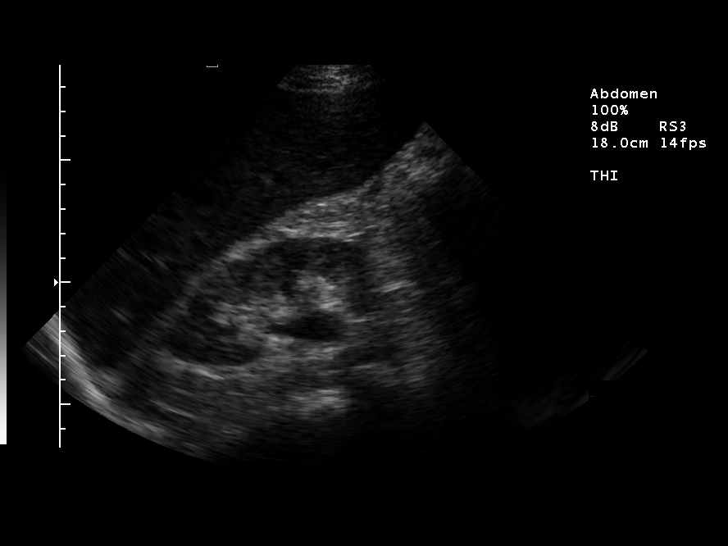
[im 32/51]
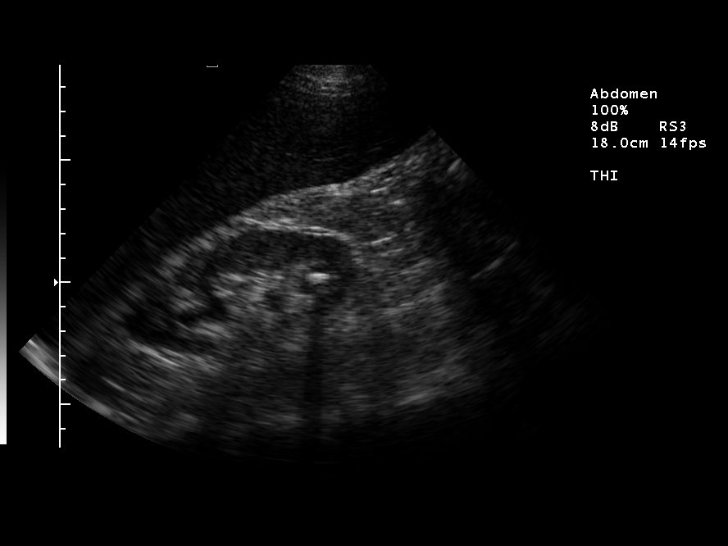
[im 34/51]
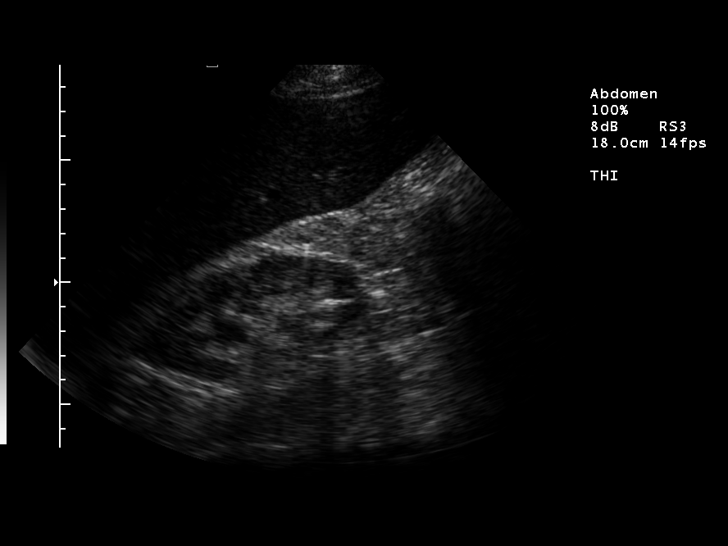
[im 38/51]
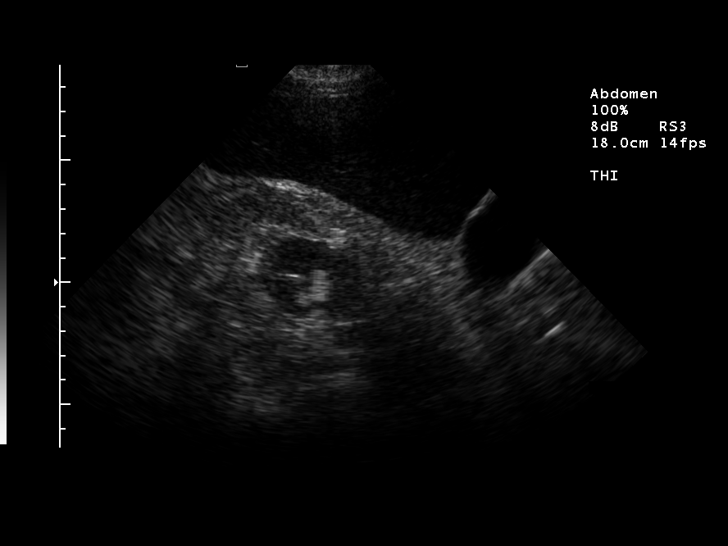
[im 42/51]
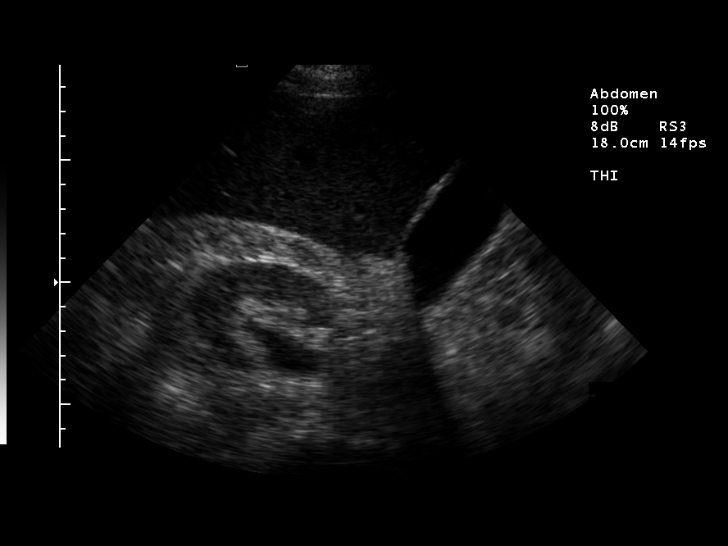
[im 46/51]
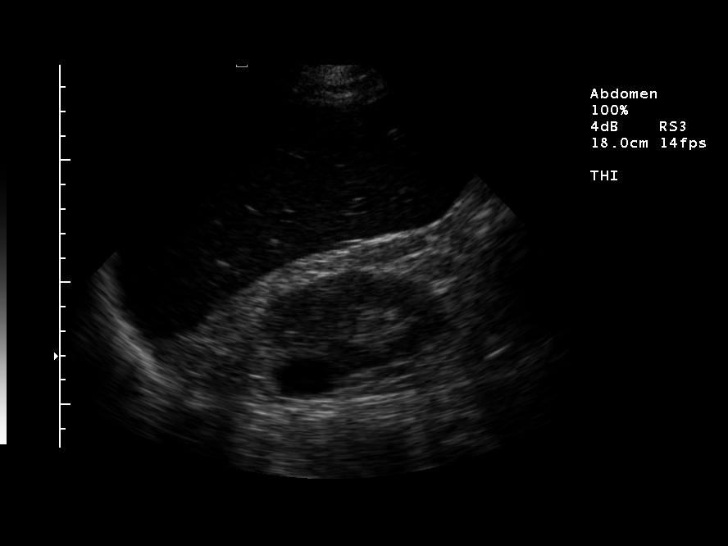
[im 51/51]
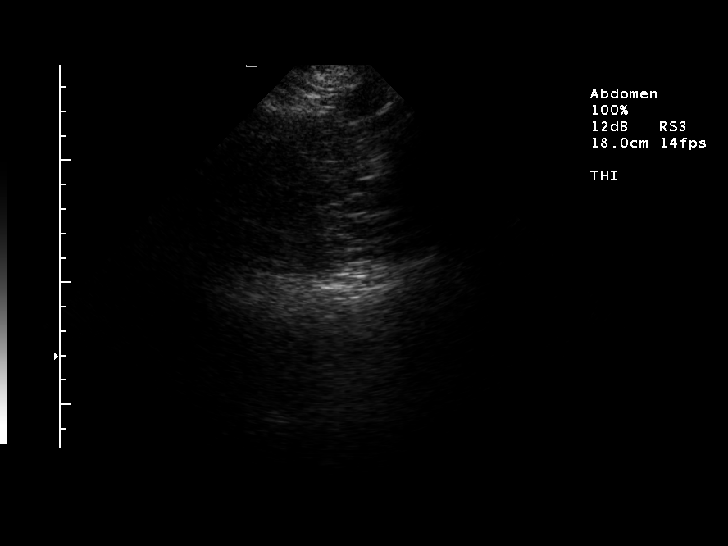

[14 of 25 positions shown; findings below may reference images not displayed]

FINDINGS: The kidneys are normal in size, shape and echotexture.
The right kidney measures 10.0 cm in length and the left kidney
measures 11.7 cm in length.  Mild dilatation of both renal
collecting systems.  Bilateral renal calculi.  3.2 cm simple cyst
arising from the upper pole of the right kidney.  Surgically absent
urinary bladder.
IMPRESSION: 1.  Mild bilateral hydronephrosis.
2.  Bilateral renal calculi.
3.  3.2 cm simple upper pole right renal cyst.

## 2009-06-16 IMAGING — CR DG CHEST 2V
2 series · 2 of 2 positions shown · non-contrast
Comparison: 12/30/2006.

CLINICAL DATA: Fever, nausea and vomiting.  History bladder and
prostate cancer.

CHEST - 2 VIEW

[w chest pa *]
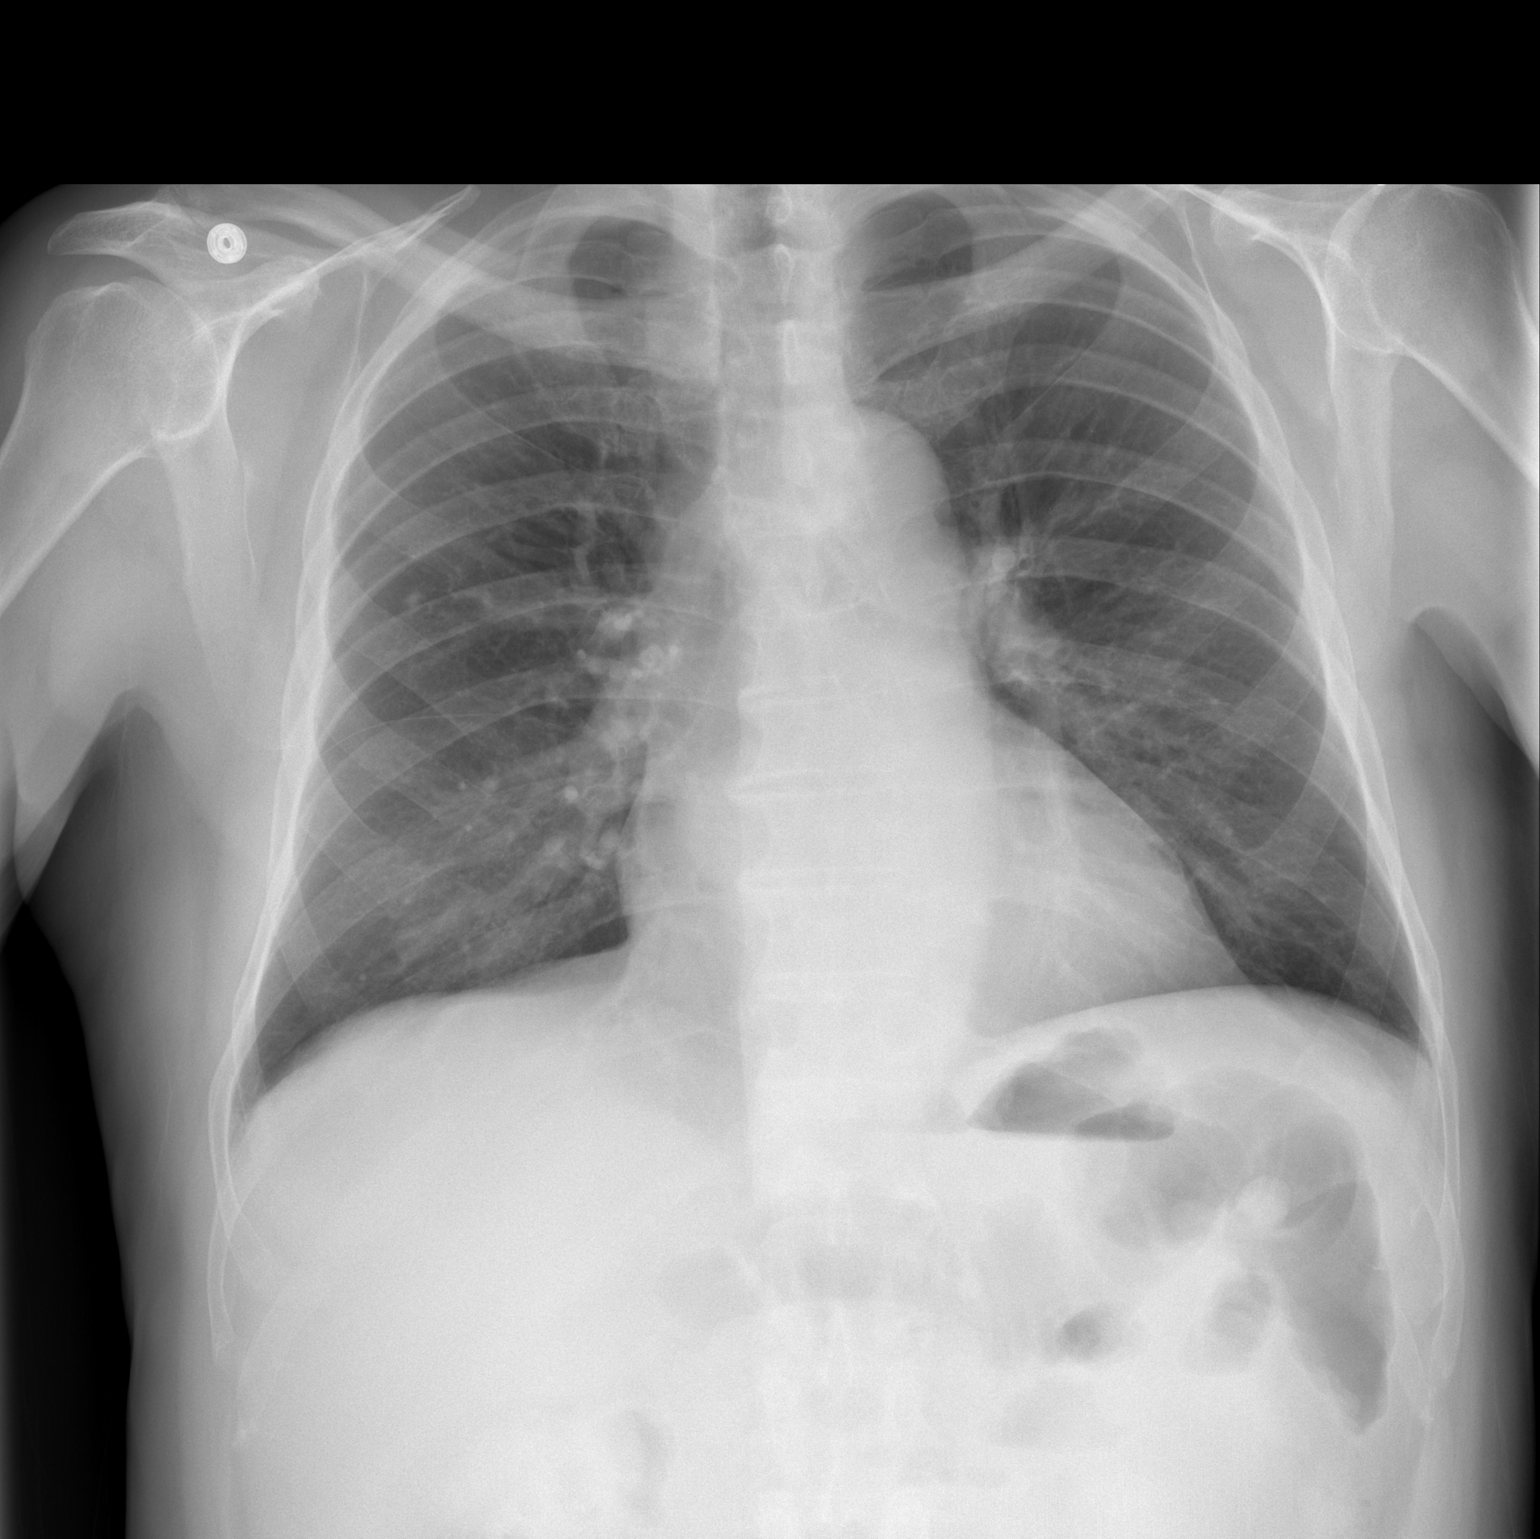

[w chest lat]
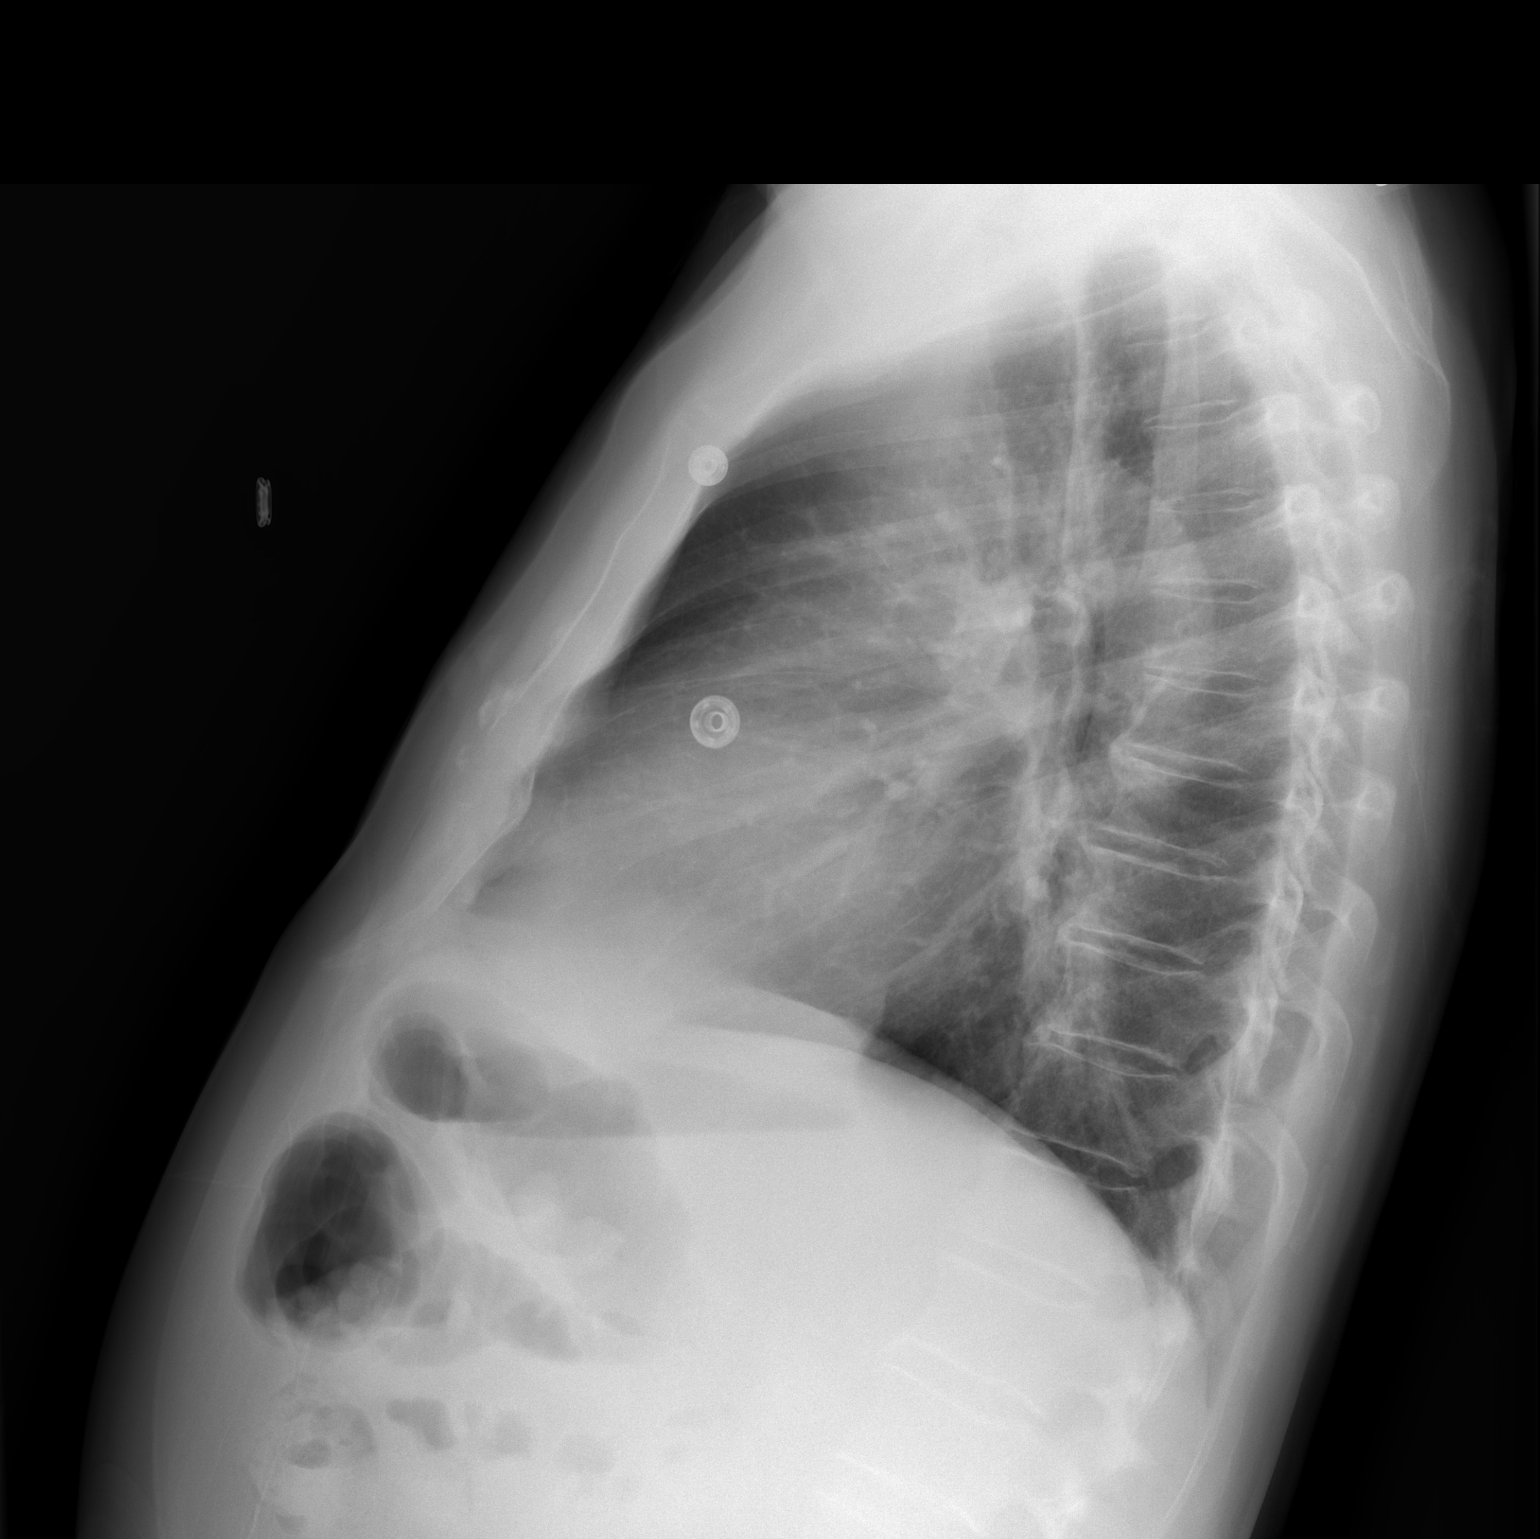

[2 of 2 positions shown; findings below may reference images not displayed]

FINDINGS: Normal sized heart.  Mildly tortuous aorta.  Clear lungs.
Stable minimal diffuse peribronchial thickening.  Mild thoracic
spine degenerative changes.  Stable small calcified granuloma in
the right mid lung zone.
IMPRESSION: Stable minimal chronic bronchitic changes and small right lung
calcified granuloma.  No acute abnormality.

## 2009-06-17 IMAGING — CT CT PELVIS W/O CM
2 of 4 series · 16 of 46 positions shown, 18 images · non-contrast
Comparison: None

CT ABDOMEN

CLINICAL DATA: Abdominal pain and distension, possible sepsis,
renal failure patient.

CT ABDOMEN AND PELVIS WITHOUT CONTRAST
TECHNIQUE: Multidetector CT imaging of the abdomen and pelvis was
performed following the standard
protocol without intravenous contrast.

[Series 2: stone_wo 5.0 b40f st · axial · 0.72mm/px · z∈[+810,+1226]mm · 13 of 114 slices shown, 15 images]
[im 5/114  soft-tissue]
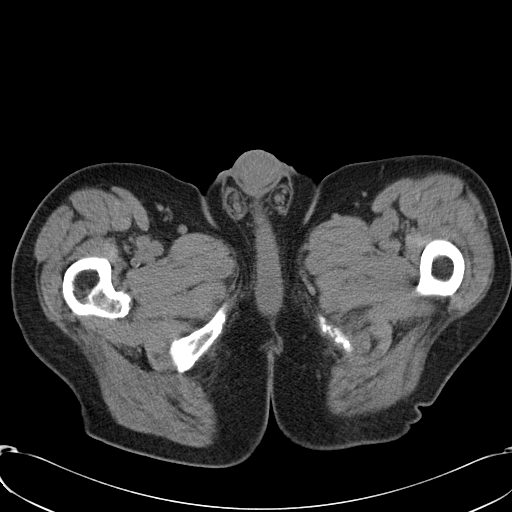
[im 5/114  bone]
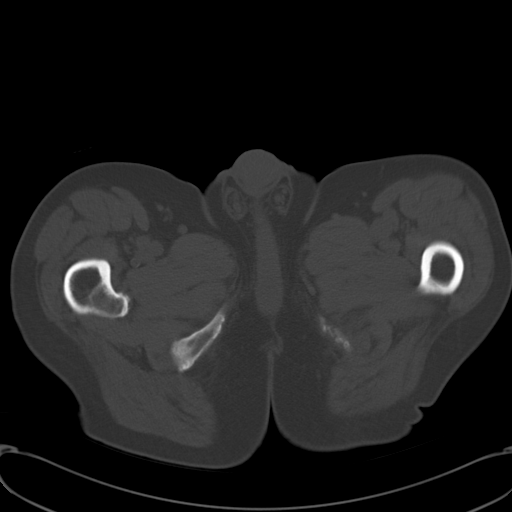
[im 14/114  soft-tissue]
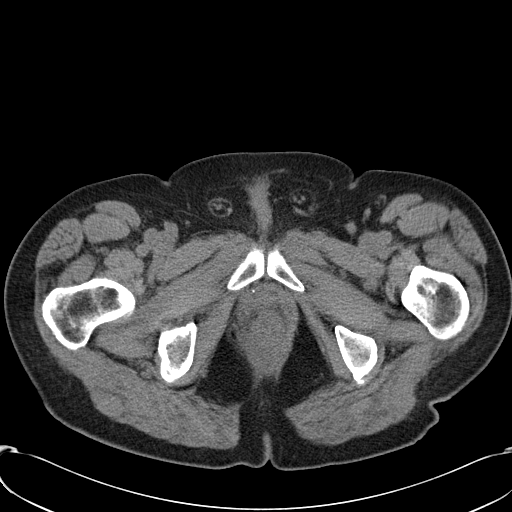
[im 23/114  soft-tissue]
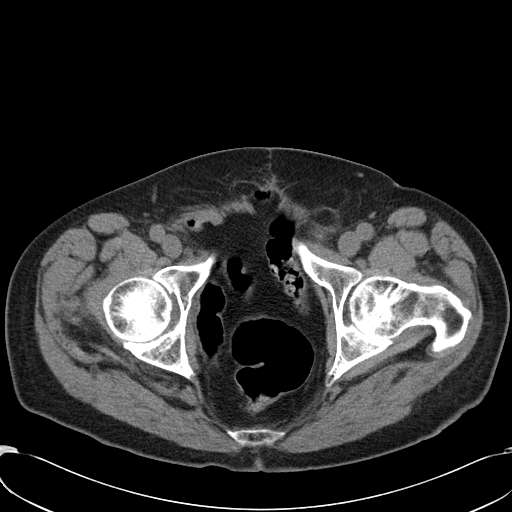
[im 32/114  soft-tissue]
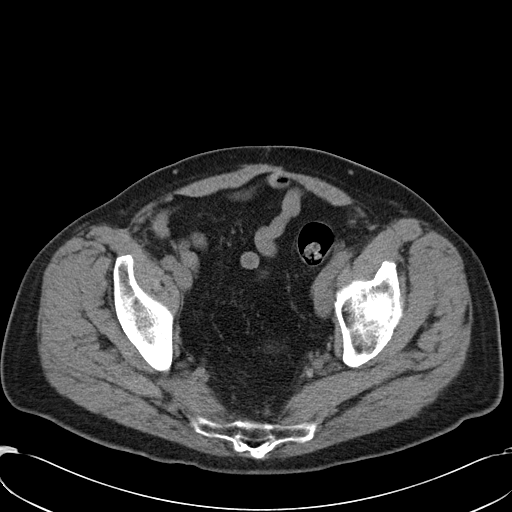
[im 41/114  soft-tissue]
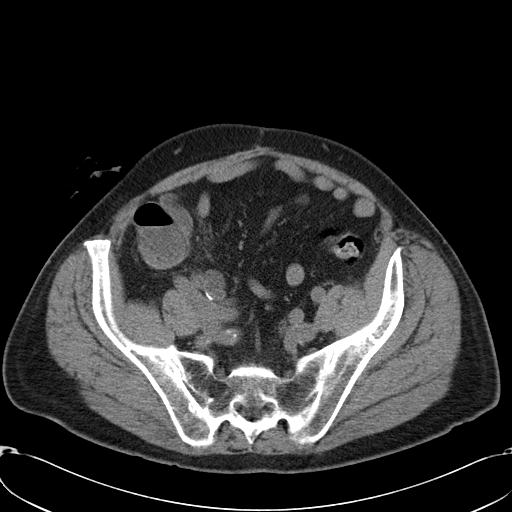
[im 50/114  soft-tissue]
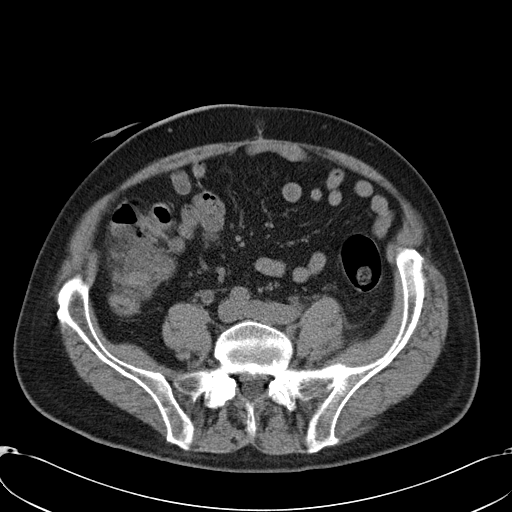
[im 59/114  soft-tissue]
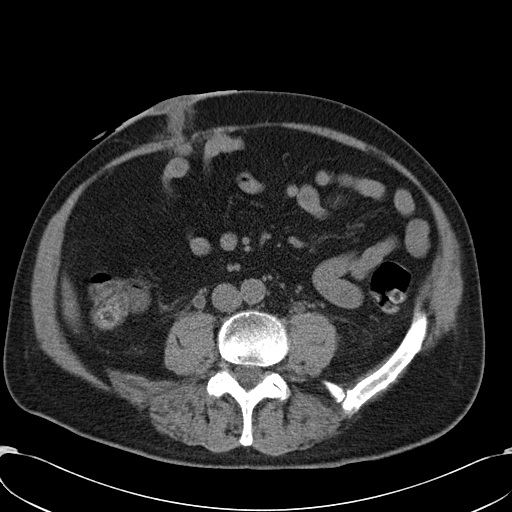
[im 64/114  soft-tissue]
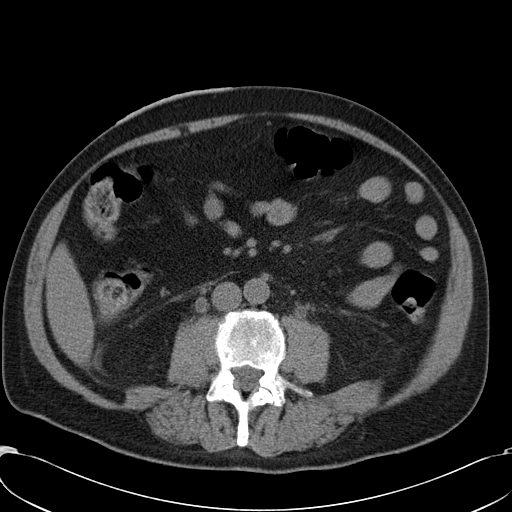
[im 73/114  soft-tissue]
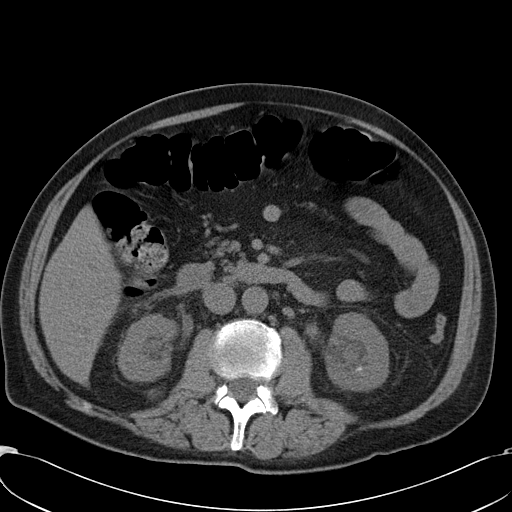
[im 73/114  bone]
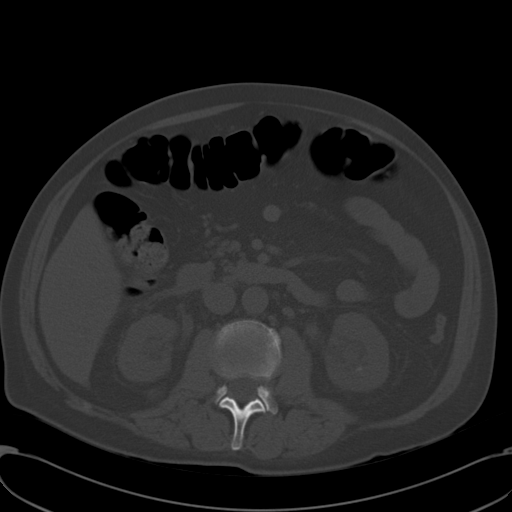
[im 82/114  soft-tissue]
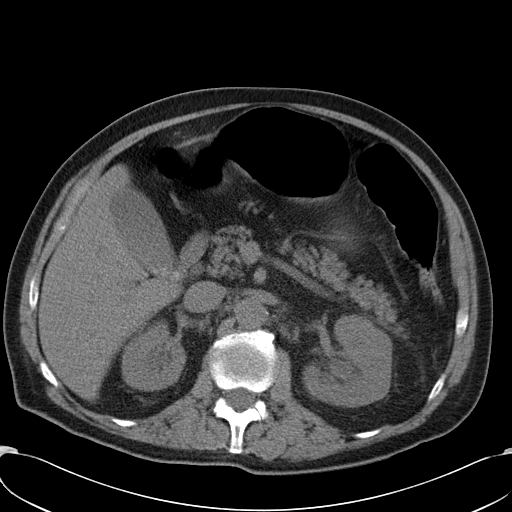
[im 91/114  soft-tissue]
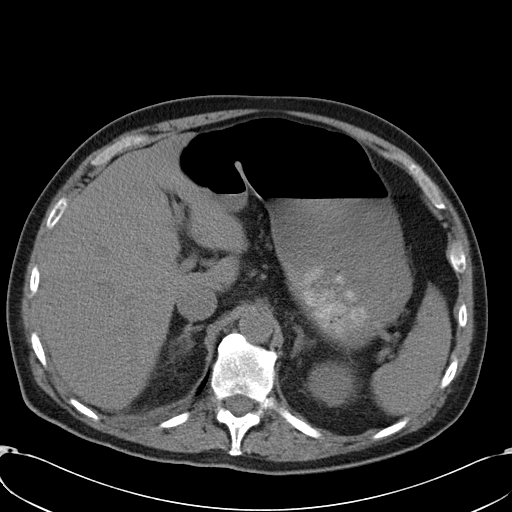
[im 100/114  soft-tissue]
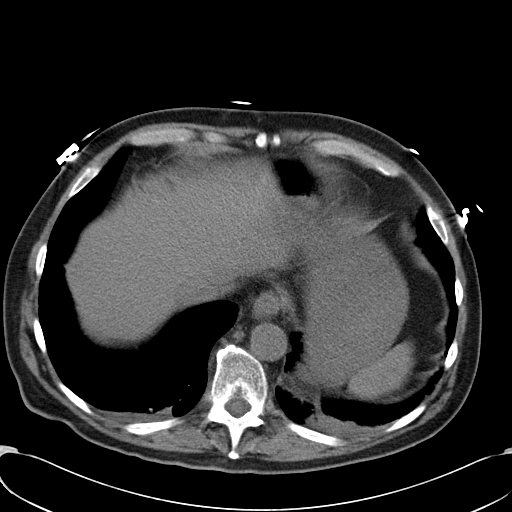
[im 109/114  soft-tissue]
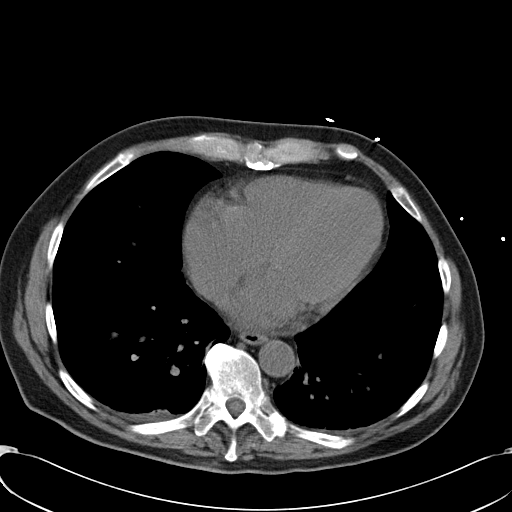

[Series 602: coronal · coronal · 0.92mm/px · 3 of 82 slices shown]
[im 28/82  soft-tissue]
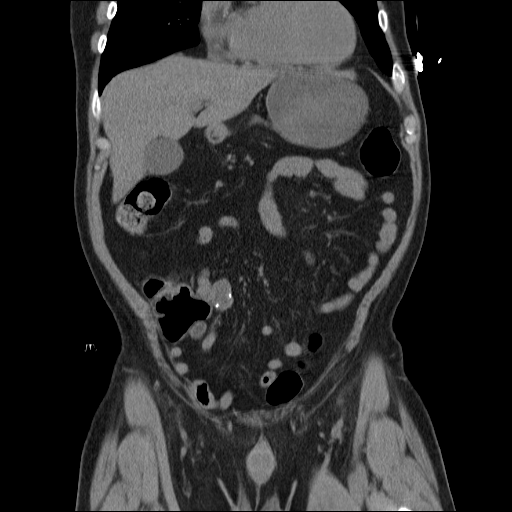
[im 37/82  soft-tissue]
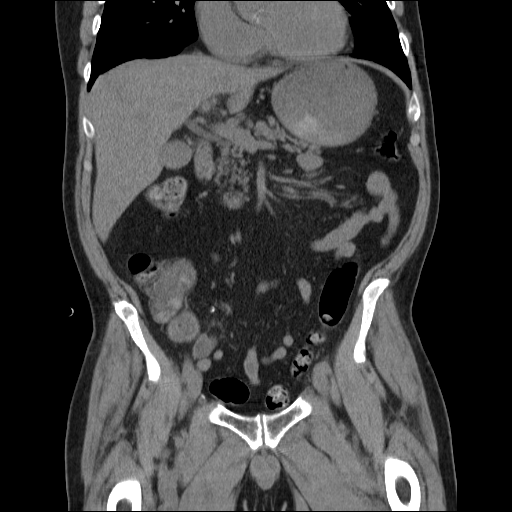
[im 46/82  soft-tissue]
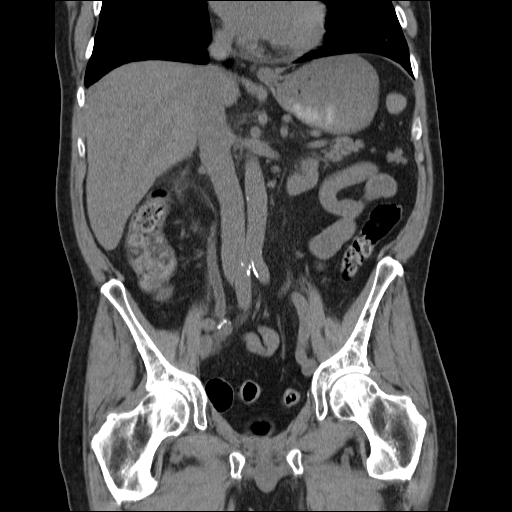

[16 of 46 positions shown; findings below may reference images not displayed]

FINDINGS: There is mild cardiomegaly present.  There are small
pleural effusions present with mild bibasilar atelectasis.  The
liver appears normal in the unenhanced state.  There do appear to
be small calcified gallstones layering in the gallbladder but no
gallbladder wall thickening is seen.  The pancreas appears
relatively atrophic.  No pancreatic ductal dilatation is noted.
The adrenal glands and spleen appear normal.  There is mild
hydronephrosis bilaterally in this patient who has undergone prior
cystectomy with urinary tract diversion.  There are bilateral renal
calculi present.  Ureters are prominent into the pelvis and CT the
pelvis is to be performed.  The abdominal aorta is normal in
caliber.  Probable cyst emanates from the upper pole of the right
kidney posteriorly.
IMPRESSION: 1.  Mild hydronephrosis bilaterally with renal calculi in this
patient who has undergone prior urinary tract diversion.
2. Small gallstones.
3.  Small pleural effusions and bibasilar atelectasis.

CT PELVIS
FINDINGS: The ureters remain prominent to a point of anastomoses
with small bowel within the right lower quadrant where surgical
sutures are present.  No ureteral calculi are seen.  Ostomy is
noted within the right abdomen and there is no evidence of
distension of bowel at that site.  There are a few sigmoid colon
diverticula present.  No fluid or mass is seen within the pelvis.
IMPRESSION: 1.  The ureters remain prominent to  point of insertion in small
bowel in this patient with urinary tract diversion.  No ureteral
calculi are seen, and the ostomy is noted in the right abdomen with
no evidence of bowel distension or edema.

## 2009-09-28 IMAGING — CR DG CHEST 2V
2 series · 2 of 2 positions shown · non-contrast
Comparison: 08/29/2007

CLINICAL DATA: Bradycardia, pacer placement

CHEST - 2 VIEW

[w chest pa]
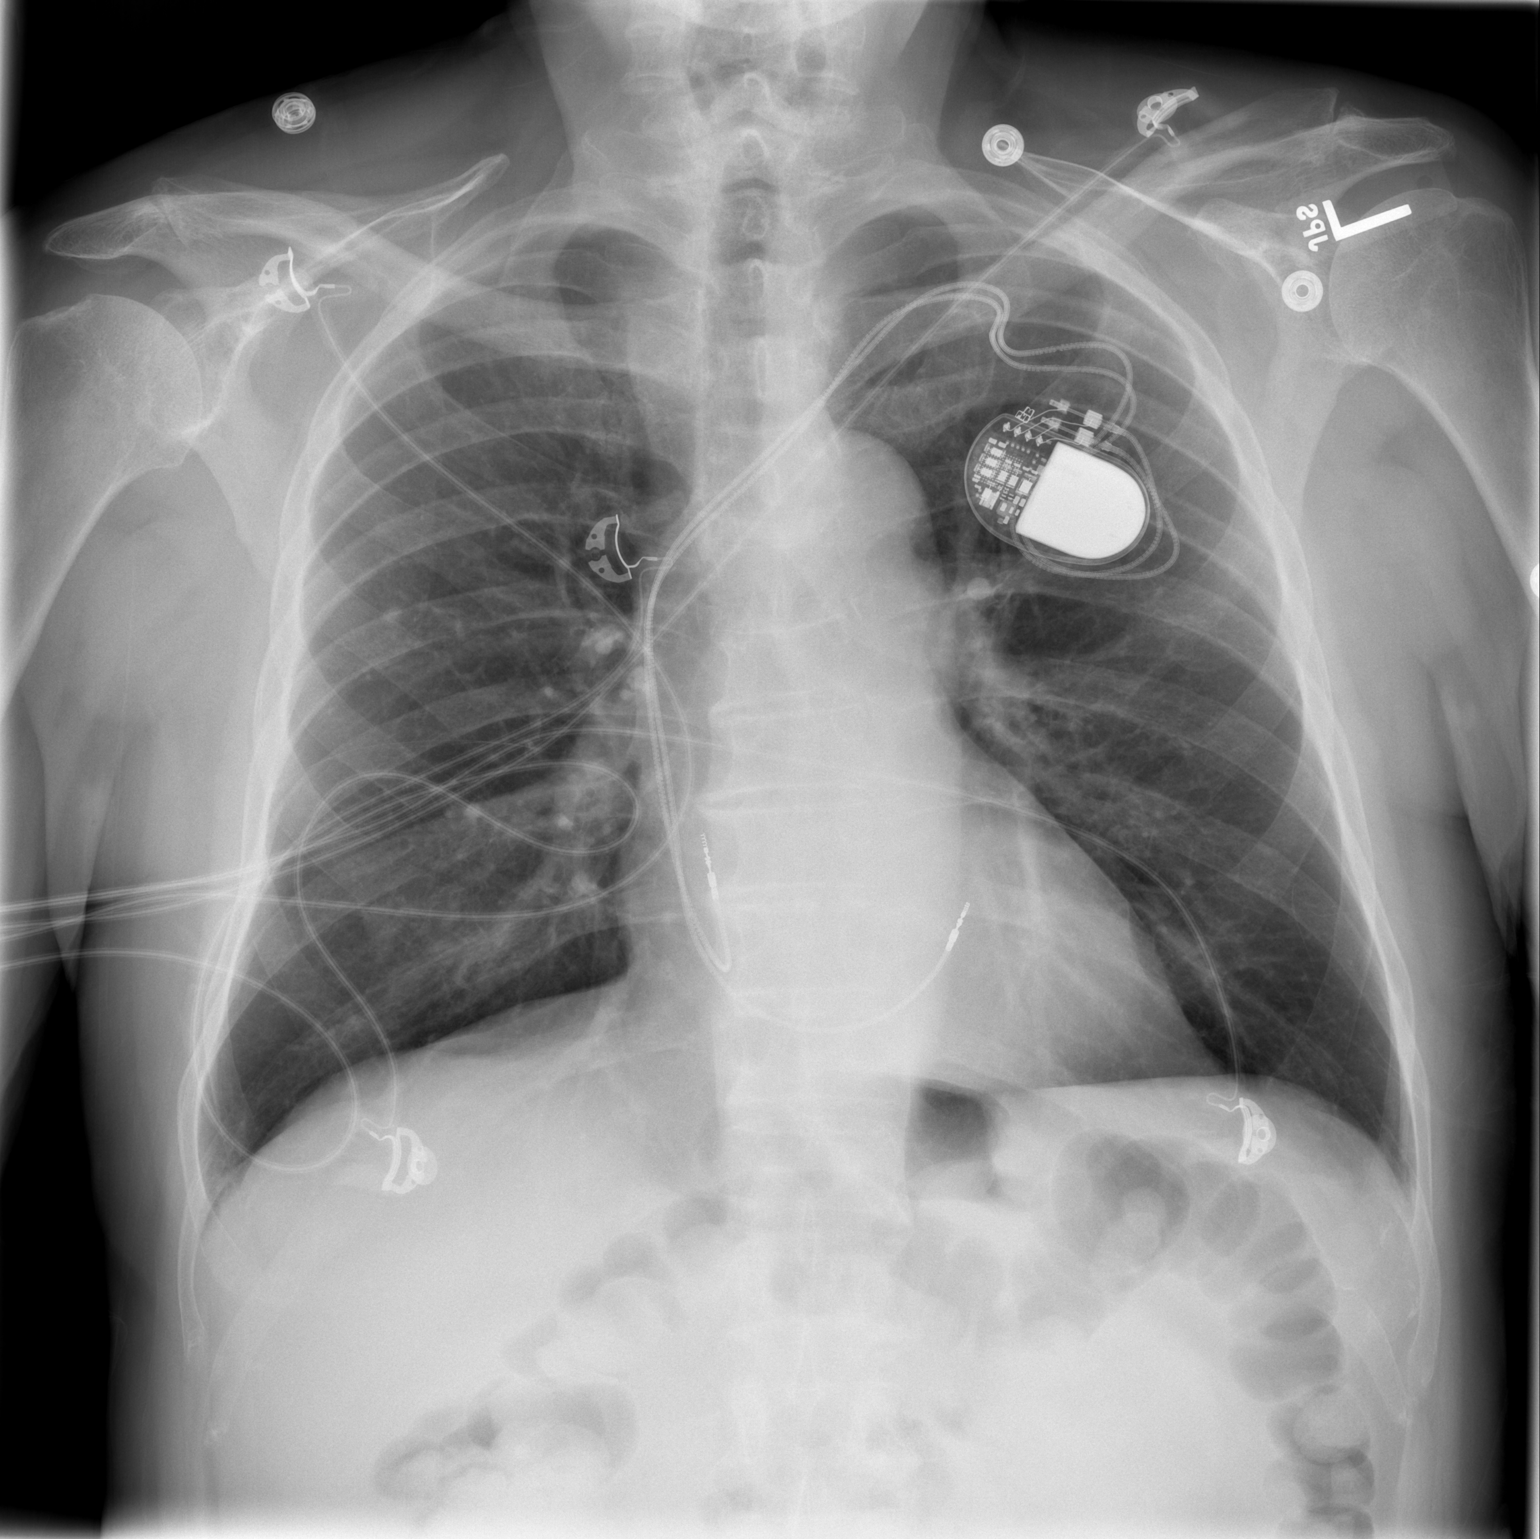

[w chest lat]
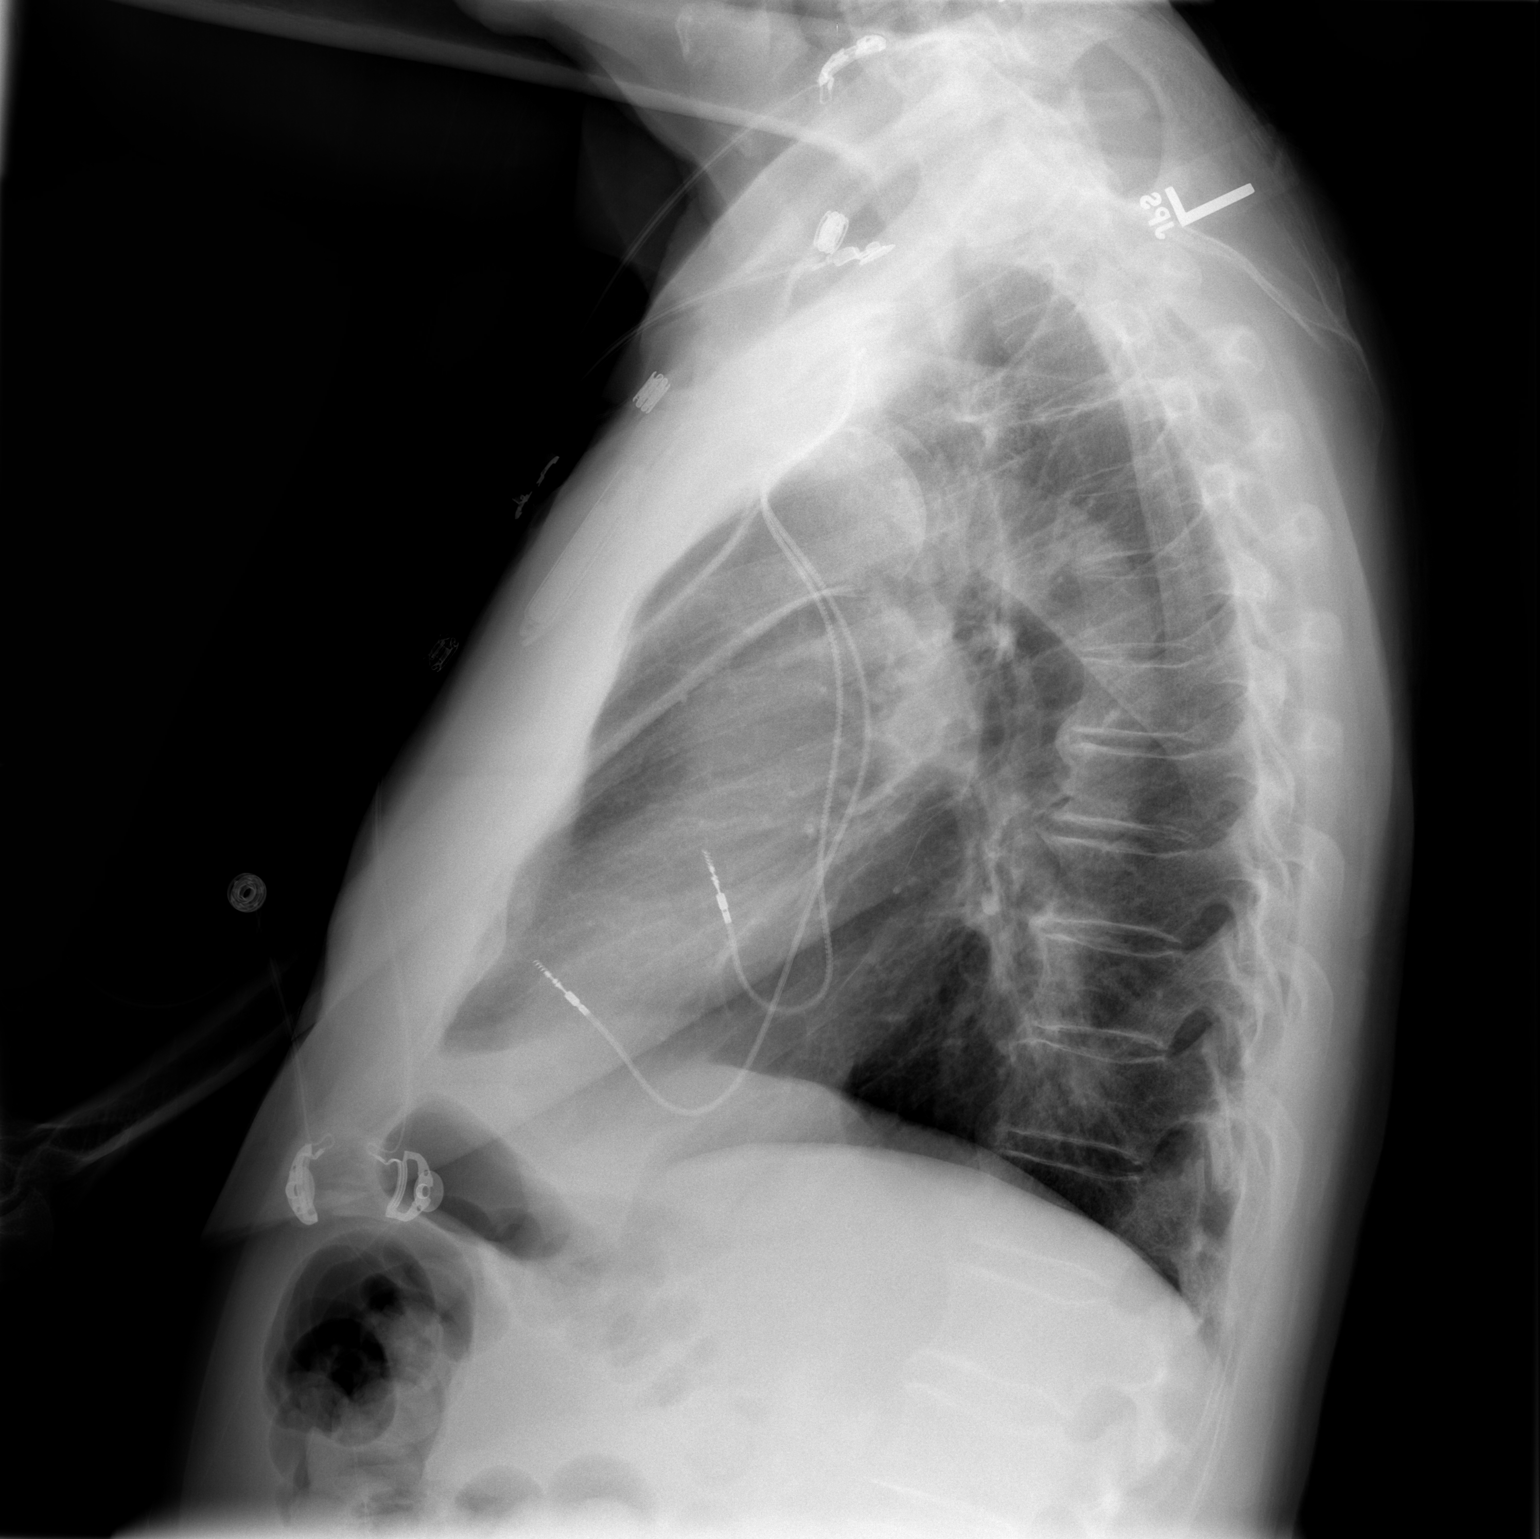

[2 of 2 positions shown; findings below may reference images not displayed]

FINDINGS: New left-sided dual lead pacemaker noted with leads over
the right atrium and ventricle, respectively.  No pneumothorax.
Heart size normal.  Right upper lobe granuloma stable.  No new
focal pulmonary opacity.  No pleural effusion.
IMPRESSION: No pneumothorax after pacemaker placement as above.

## 2010-01-01 ENCOUNTER — Ambulatory Visit: Payer: Self-pay | Admitting: Internal Medicine

## 2010-01-21 IMAGING — CR DG CHEST 2V
2 series · 2 of 2 positions shown · non-contrast
Comparison: 12/11/2007

CLINICAL DATA: Bladder cancer/history of pacer placement

DG CHEST 2 VIEW

[w chest pa]
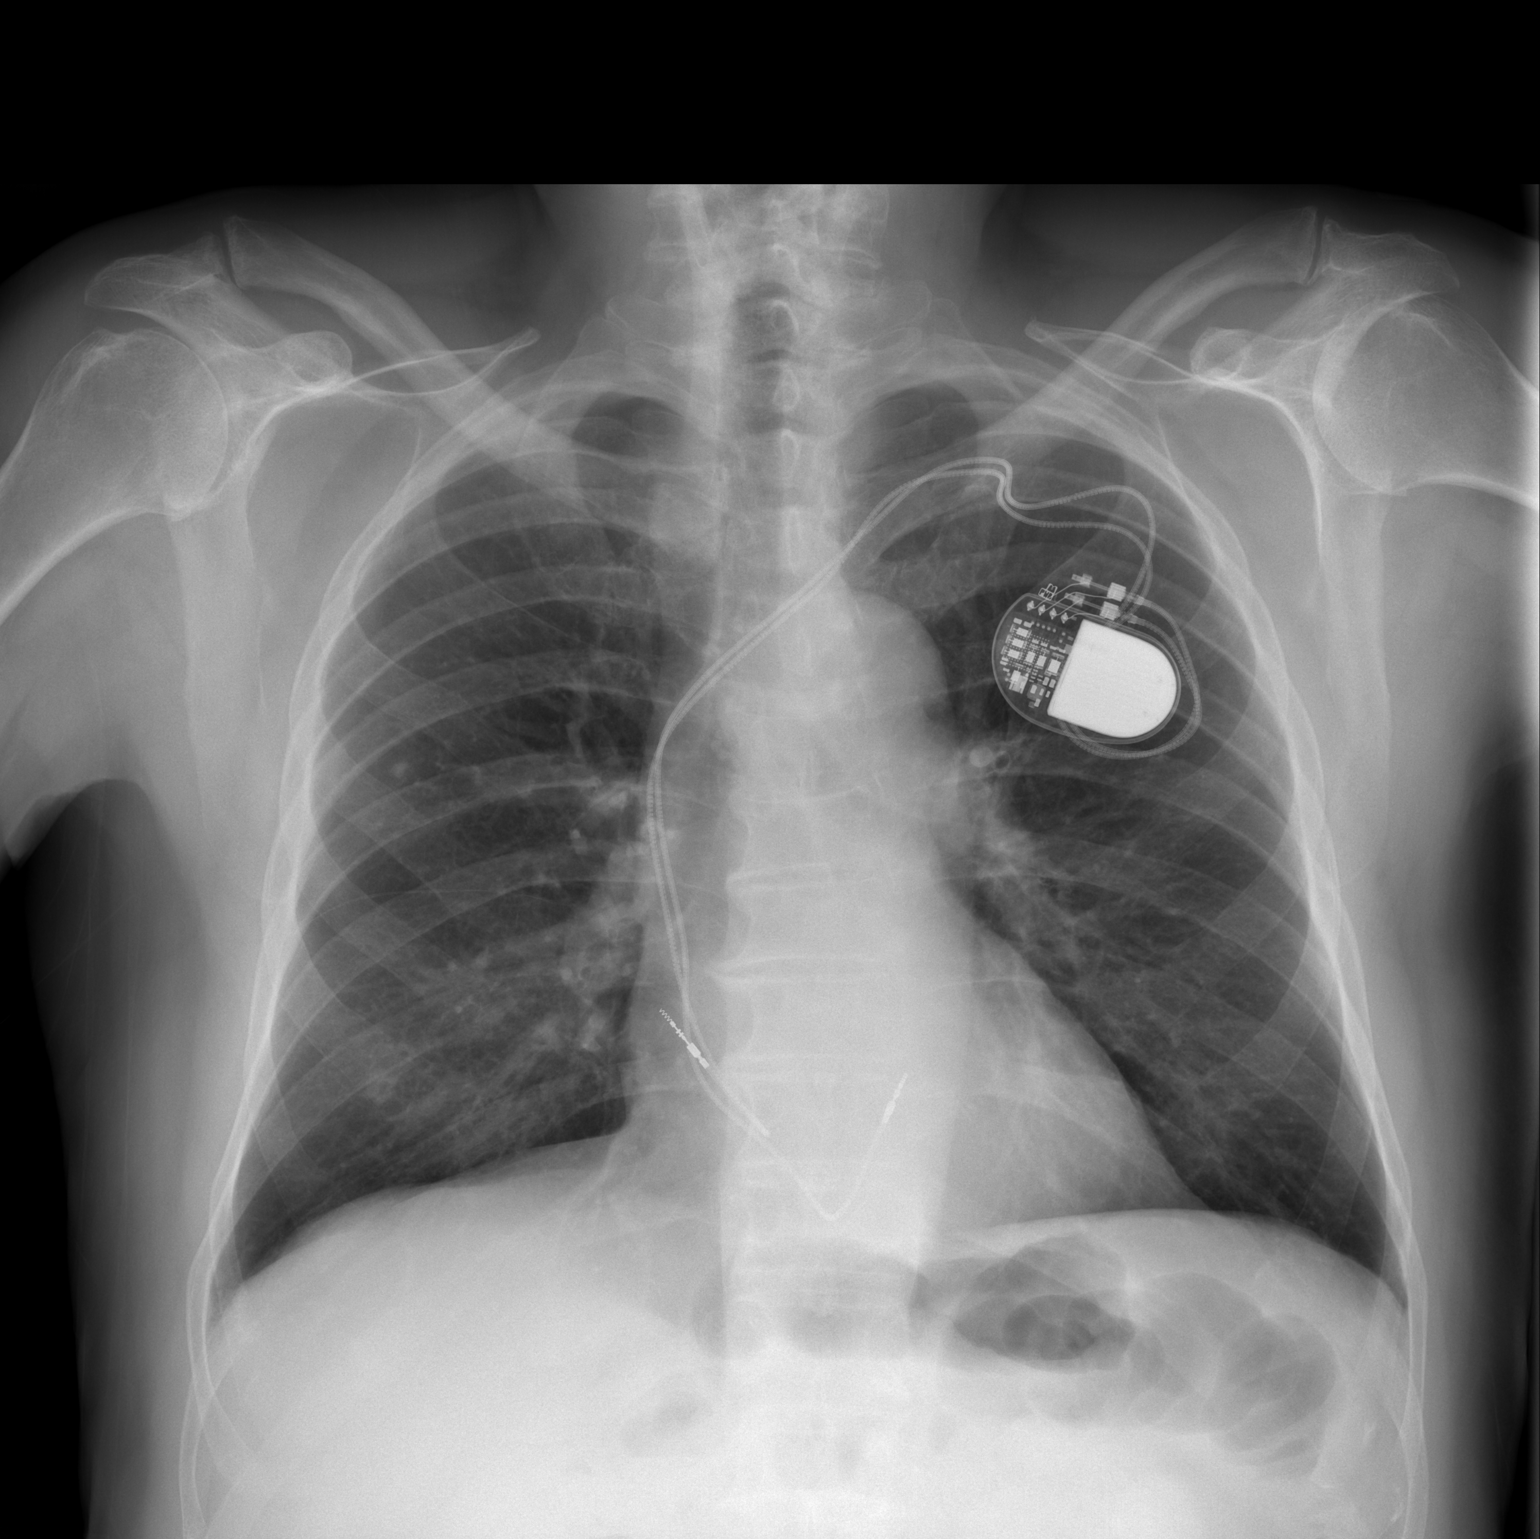

[w chest lat]
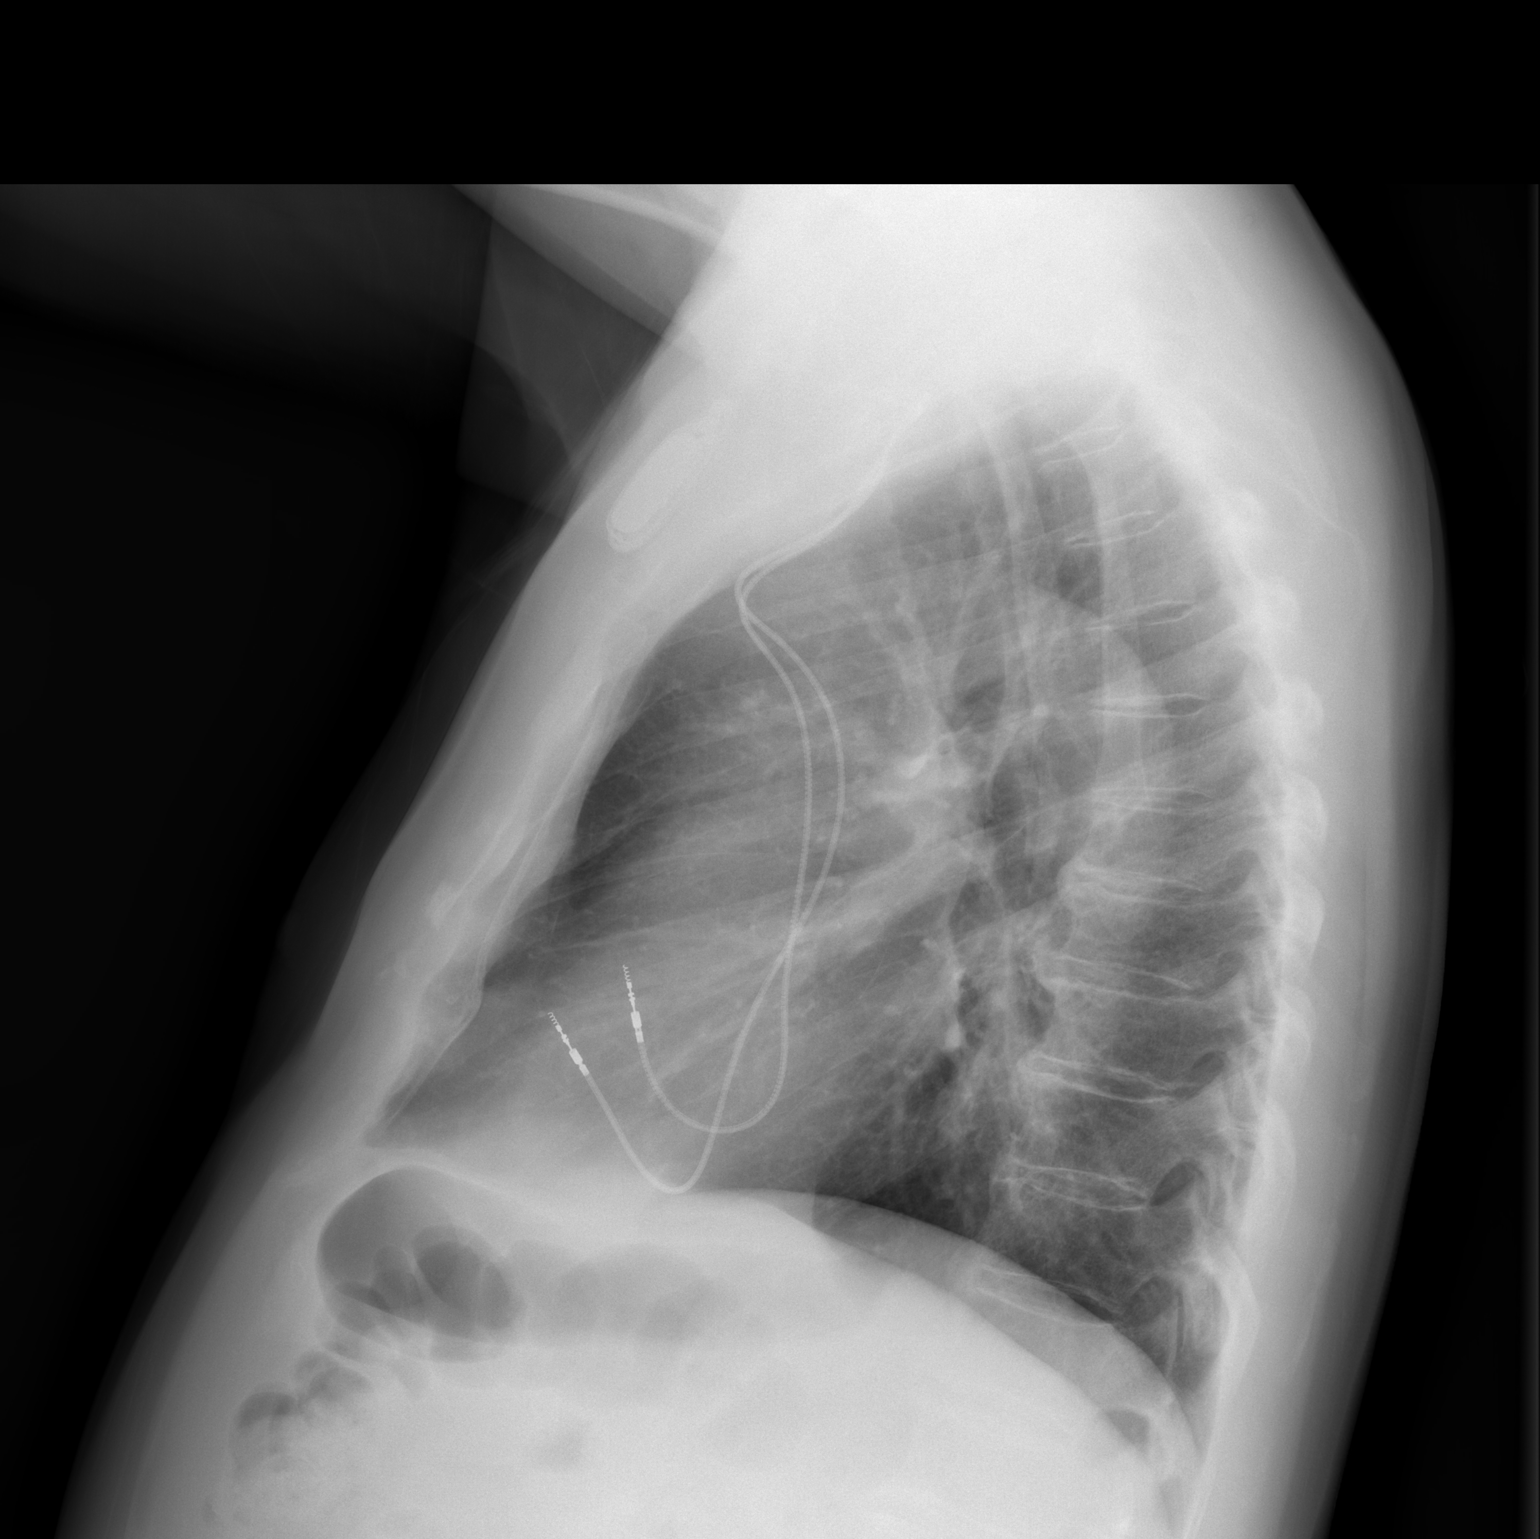

[2 of 2 positions shown; findings below may reference images not displayed]

FINDINGS: Heart and mediastinal contours normal.  Lungs clear with
several calcified granulomata noted as before.  No acute process.

Dual lead pacer unchanged.  No pneumothorax or pleural fluid.
Osseous structures intact.
IMPRESSION: Old granulomatous lung changes and stable dual lead pacer - no
active disease.

## 2010-01-25 ENCOUNTER — Encounter (INDEPENDENT_AMBULATORY_CARE_PROVIDER_SITE_OTHER): Payer: Self-pay | Admitting: *Deleted

## 2010-03-12 ENCOUNTER — Encounter: Payer: Self-pay | Admitting: Internal Medicine

## 2010-04-24 NOTE — Cardiovascular Report (Signed)
Summary: Office Visit Remote   Office Visit Remote   Imported By: Sallee Provencal 01/30/2010 15:27:05  _____________________________________________________________________  External Attachment:    Type:   Image     Comment:   External Document

## 2010-04-24 NOTE — Letter (Signed)
Summary: Remote Device Check  [facility] [place]  [id number] [place]   [place], [place] [address]   Phone: [phone]  Fax: [phone]     January 25, 2010 MRN: [id number]   [name] [name], [place]  [id number]   Dear [name],   Your remote transmission was recieved and reviewed by your physician.  All diagnostics were within normal limits for you.  _____Your next transmission is scheduled for:                       .  Please transmit at any time this day.  If you have a wireless device your transmission will be sent automatically.  ___X___Your next office visit is scheduled for:   January 2012                           . Please call our office to schedule an appointment.    Sincerely,  Alma Friendly, LPN

## 2010-04-26 NOTE — Miscellaneous (Signed)
Summary: Device preload  Clinical Lists Changes  Observations: Added new observation of PPM INDICATN: Brady (03/12/2010 11:31) Added new observation of MAGNET RTE: BOL 85 ERI 65 (03/12/2010 11:31) Added new observation of PPMLEADSTAT2: active (03/12/2010 11:31) Added new observation of PPMLEADSER2: [id number] (03/12/2010 11:31) Added new observation of PPMLEADMOD2: 5076  (03/12/2010 11:31) Added new observation of PPMLEADDOI2: 12/10/2007  (03/12/2010 11:31) Added new observation of PPMLEADLOC2: RV  (03/12/2010 11:31) Added new observation of PPMLEADSTAT1: active  (03/12/2010 11:31) Added new observation of PPMLEADSER1: [id number]  (03/12/2010 11:31) Added new observation of PPMLEADMOD1: 5076  (03/12/2010 11:31) Added new observation of PPMLEADDOI1: 12/10/2007  (03/12/2010 11:31) Added new observation of PPMLEADLOC1: RA  (03/12/2010 11:31) Added new observation of PPM DOI: 12/10/2007  (03/12/2010 11:31) Added new observation of PPM SERL#: [id number]  (03/12/2010 11:31) Added new observation of PPM MODL#: [id number]  (03/12/2010 11:31) Added new observation of PACEMAKERMFG: [employer]  (03/12/2010 11:31) Added new observation of PPM IMP MD: Sinda Du  (03/12/2010 11:31) Added new observation of PPM REFER MD: Servando Salina  (03/12/2010 11:31) Added new observation of PACEMAKER MD: Cristopher Peru, MD  (03/12/2010 11:31)      PPM Specifications Following MD:  Cristopher Peru, MD     Referring MD:  Servando Salina PPM Vendor:  [employer]onic     PPM Model Number:  [id number]     PPM Serial Number:  [id number] PPM DOI:  12/10/2007     PPM Implanting MD:  Sinda Du  Lead 1    Location: RA     DOI: 12/10/2007     Model #: [id number]     Serial #: [id number]     Status: active Lead 2    Location: RV     DOI: 12/10/2007     Model #: [id number]     Serial #: [id number]     Status: active  Magnet Response Rate:  BOL 85 ERI 65  Indications:  [name] [name]

## 2010-05-06 ENCOUNTER — Emergency Department (HOSPITAL_COMMUNITY)
Admission: EM | Admit: 2010-05-06 | Discharge: 2010-05-06 | Disposition: A | Discharge status: Home / Self Care | Payer: Medicare Other | Attending: Emergency Medicine | Admitting: Emergency Medicine

## 2010-05-06 DIAGNOSIS — I10 Essential (primary) hypertension: Secondary | ICD-10-CM | POA: Insufficient documentation

## 2010-05-06 DIAGNOSIS — Z8551 Personal history of malignant neoplasm of bladder: Secondary | ICD-10-CM | POA: Insufficient documentation

## 2010-05-06 DIAGNOSIS — R109 Unspecified abdominal pain: Secondary | ICD-10-CM | POA: Insufficient documentation

## 2010-05-06 DIAGNOSIS — N390 Urinary tract infection, site not specified: Secondary | ICD-10-CM | POA: Insufficient documentation

## 2010-05-06 LAB — URINALYSIS, ROUTINE W REFLEX MICROSCOPIC
Bilirubin Urine: NEGATIVE
Ketones, ur: NEGATIVE mg/dL
Nitrite: NEGATIVE
Protein, ur: 100 mg/dL — AB
Specific Gravity, Urine: 1.014 (ref 1.005–1.030)
Urine Glucose, Fasting: NEGATIVE mg/dL
Urobilinogen, UA: 0.2 mg/dL (ref 0.0–1.0)
pH: 7 (ref 5.0–8.0)

## 2010-05-06 LAB — CBC
HCT: 34.6 % — ABNORMAL LOW (ref 39.0–52.0)
Hemoglobin: 11.9 g/dL — ABNORMAL LOW (ref 13.0–17.0)
MCH: 31.7 pg (ref 26.0–34.0)
MCHC: 34.4 g/dL (ref 30.0–36.0)
MCV: 92.3 fL (ref 78.0–100.0)
Platelets: 225 10*3/uL (ref 150–400)
RBC: 3.75 MIL/uL — ABNORMAL LOW (ref 4.22–5.81)
RDW: 14 % (ref 11.5–15.5)
WBC: 18.6 10*3/uL — ABNORMAL HIGH (ref 4.0–10.5)

## 2010-05-06 LAB — BASIC METABOLIC PANEL
BUN: 109 mg/dL — ABNORMAL HIGH (ref 6–23)
CO2: 24 mEq/L (ref 19–32)
Calcium: 8.6 mg/dL (ref 8.4–10.5)
Chloride: 101 mEq/L (ref 96–112)
Creatinine, Ser: 6.64 mg/dL — ABNORMAL HIGH (ref 0.4–1.5)
GFR calc Af Amer: 10 mL/min — ABNORMAL LOW (ref >60)
GFR calc non Af Amer: 8 mL/min — ABNORMAL LOW (ref >60)
Glucose, Bld: 92 mg/dL (ref 70–99)
Potassium: 4.5 mEq/L (ref 3.5–5.1)
Sodium: 135 mEq/L (ref 135–145)

## 2010-05-06 LAB — DIFFERENTIAL
Basophils Absolute: 0 10*3/uL (ref 0.0–0.1)
Basophils Relative: 0 % (ref 0–1)
Eosinophils Absolute: 0.4 10*3/uL (ref 0.0–0.7)
Eosinophils Relative: 2 % (ref 0–5)
Lymphocytes Relative: 5 % — ABNORMAL LOW (ref 12–46)
Lymphs Abs: 0.9 10*3/uL (ref 0.7–4.0)
Monocytes Absolute: 1.5 10*3/uL — ABNORMAL HIGH (ref 0.1–1.0)
Monocytes Relative: 8 % (ref 3–12)
Neutro Abs: 15.8 10*3/uL — ABNORMAL HIGH (ref 1.7–7.7)
Neutrophils Relative %: 85 % — ABNORMAL HIGH (ref 43–77)

## 2010-05-06 LAB — URINE MICROSCOPIC-ADD ON

## 2010-05-09 ENCOUNTER — Inpatient Hospital Stay (HOSPITAL_COMMUNITY)
Admission: AD | Admit: 2010-05-09 | Discharge: 2010-05-12 | DRG: 683 | Disposition: A | Discharge status: Home / Self Care | Payer: Medicare Other | Source: Ambulatory Visit | Attending: Internal Medicine | Admitting: Internal Medicine

## 2010-05-09 DIAGNOSIS — N179 Acute kidney failure, unspecified: Principal | ICD-10-CM | POA: Diagnosis present

## 2010-05-09 DIAGNOSIS — I129 Hypertensive chronic kidney disease with stage 1 through stage 4 chronic kidney disease, or unspecified chronic kidney disease: Secondary | ICD-10-CM | POA: Diagnosis present

## 2010-05-09 DIAGNOSIS — F329 Major depressive disorder, single episode, unspecified: Secondary | ICD-10-CM | POA: Diagnosis present

## 2010-05-09 DIAGNOSIS — M810 Age-related osteoporosis without current pathological fracture: Secondary | ICD-10-CM | POA: Diagnosis present

## 2010-05-09 DIAGNOSIS — Z906 Acquired absence of other parts of urinary tract: Secondary | ICD-10-CM

## 2010-05-09 DIAGNOSIS — Z950 Presence of cardiac pacemaker: Secondary | ICD-10-CM

## 2010-05-09 DIAGNOSIS — M109 Gout, unspecified: Secondary | ICD-10-CM | POA: Diagnosis present

## 2010-05-09 DIAGNOSIS — Z7982 Long term (current) use of aspirin: Secondary | ICD-10-CM

## 2010-05-09 DIAGNOSIS — Z66 Do not resuscitate: Secondary | ICD-10-CM | POA: Diagnosis present

## 2010-05-09 DIAGNOSIS — F3289 Other specified depressive episodes: Secondary | ICD-10-CM | POA: Diagnosis present

## 2010-05-09 DIAGNOSIS — Z8546 Personal history of malignant neoplasm of prostate: Secondary | ICD-10-CM

## 2010-05-09 DIAGNOSIS — Z8551 Personal history of malignant neoplasm of bladder: Secondary | ICD-10-CM

## 2010-05-09 DIAGNOSIS — N189 Chronic kidney disease, unspecified: Secondary | ICD-10-CM | POA: Diagnosis present

## 2010-05-09 DIAGNOSIS — N390 Urinary tract infection, site not specified: Secondary | ICD-10-CM | POA: Diagnosis present

## 2010-05-09 DIAGNOSIS — Z79899 Other long term (current) drug therapy: Secondary | ICD-10-CM

## 2010-05-09 DIAGNOSIS — E875 Hyperkalemia: Secondary | ICD-10-CM | POA: Diagnosis present

## 2010-05-09 LAB — DIFFERENTIAL
Basophils Absolute: 0.1 10*3/uL (ref 0.0–0.1)
Basophils Relative: 1 % (ref 0–1)
Eosinophils Absolute: 0.3 10*3/uL (ref 0.0–0.7)
Eosinophils Relative: 3 % (ref 0–5)
Lymphocytes Relative: 19 % (ref 12–46)
Lymphs Abs: 1.9 10*3/uL (ref 0.7–4.0)
Monocytes Absolute: 0.8 10*3/uL (ref 0.1–1.0)
Monocytes Relative: 8 % (ref 3–12)
Neutro Abs: 7.1 10*3/uL (ref 1.7–7.7)
Neutrophils Relative %: 69 % (ref 43–77)

## 2010-05-09 LAB — CBC
HCT: 34.3 % — ABNORMAL LOW (ref 39.0–52.0)
Hemoglobin: 11.6 g/dL — ABNORMAL LOW (ref 13.0–17.0)
MCH: 31.7 pg (ref 26.0–34.0)
MCHC: 33.8 g/dL (ref 30.0–36.0)
MCV: 93.7 fL (ref 78.0–100.0)
Platelets: 316 10*3/uL (ref 150–400)
RBC: 3.66 MIL/uL — ABNORMAL LOW (ref 4.22–5.81)
RDW: 14.3 % (ref 11.5–15.5)
WBC: 10.2 10*3/uL (ref 4.0–10.5)

## 2010-05-09 LAB — URINALYSIS, ROUTINE W REFLEX MICROSCOPIC
Ketones, ur: NEGATIVE mg/dL
Nitrite: NEGATIVE
Protein, ur: 30 mg/dL — AB
Urine Glucose, Fasting: NEGATIVE mg/dL
pH: 6.5 (ref 5.0–8.0)

## 2010-05-09 LAB — URINE MICROSCOPIC-ADD ON

## 2010-05-09 LAB — PHOSPHORUS: Phosphorus: 5.3 mg/dL — ABNORMAL HIGH (ref 2.3–4.6)

## 2010-05-09 LAB — MAGNESIUM: Magnesium: 2.8 mg/dL — ABNORMAL HIGH (ref 1.5–2.5)

## 2010-05-10 LAB — BASIC METABOLIC PANEL
CO2: 26 mEq/L (ref 19–32)
CO2: 26 mEq/L (ref 19–32)
Calcium: 8.8 mg/dL (ref 8.4–10.5)
Calcium: 8.6 mg/dL (ref 8.4–10.5)
GFR calc Af Amer: 18 mL/min — ABNORMAL LOW (ref >60)
GFR calc Af Amer: 21 mL/min — ABNORMAL LOW (ref >60)
GFR calc non Af Amer: 17 mL/min — ABNORMAL LOW (ref >60)
Potassium: 6.5 mEq/L (ref 3.5–5.1)
Potassium: 5.1 mEq/L (ref 3.5–5.1)
Sodium: 141 mEq/L (ref 135–145)
Sodium: 140 mEq/L (ref 135–145)

## 2010-05-10 LAB — CBC
HCT: 34 % — ABNORMAL LOW (ref 39.0–52.0)
MCH: 31.3 pg (ref 26.0–34.0)
MCHC: 33.5 g/dL (ref 30.0–36.0)
MCV: 93.4 fL (ref 78.0–100.0)
Platelets: 332 10*3/uL (ref 150–400)
RDW: 14.3 % (ref 11.5–15.5)

## 2010-05-10 LAB — COMPREHENSIVE METABOLIC PANEL
Alkaline Phosphatase: 55 U/L (ref 39–117)
BUN: 64 mg/dL — ABNORMAL HIGH (ref 6–23)
Calcium: 8.7 mg/dL (ref 8.4–10.5)
Creatinine, Ser: 4 mg/dL — ABNORMAL HIGH (ref 0.4–1.5)
Glucose, Bld: 84 mg/dL (ref 70–99)
Potassium: 6.5 mEq/L (ref 3.5–5.1)
Total Protein: 6.9 g/dL (ref 6.0–8.3)

## 2010-05-10 LAB — DIFFERENTIAL
Basophils Relative: 1 % (ref 0–1)
Eosinophils Absolute: 0.5 10*3/uL (ref 0.0–0.7)
Eosinophils Relative: 5 % (ref 0–5)
Lymphs Abs: 1.8 10*3/uL (ref 0.7–4.0)
Monocytes Absolute: 0.7 10*3/uL (ref 0.1–1.0)

## 2010-05-10 LAB — URINE CULTURE: Special Requests: NEGATIVE

## 2010-05-10 LAB — GLUCOSE, CAPILLARY
Glucose-Capillary: 81 mg/dL (ref 70–99)
Glucose-Capillary: 149 mg/dL — ABNORMAL HIGH (ref 70–99)

## 2010-05-11 ENCOUNTER — Inpatient Hospital Stay (HOSPITAL_COMMUNITY): Payer: Medicare Other

## 2010-05-11 LAB — DIFFERENTIAL
Lymphocytes Relative: 17 % (ref 12–46)
Lymphs Abs: 1.8 10*3/uL (ref 0.7–4.0)
Neutrophils Relative %: 69 % (ref 43–77)

## 2010-05-11 LAB — CBC
HCT: 34.4 % — ABNORMAL LOW (ref 39.0–52.0)
MCV: 97.2 fL (ref 78.0–100.0)
RBC: 3.54 MIL/uL — ABNORMAL LOW (ref 4.22–5.81)
WBC: 10.9 10*3/uL — ABNORMAL HIGH (ref 4.0–10.5)

## 2010-05-11 LAB — COMPREHENSIVE METABOLIC PANEL
Alkaline Phosphatase: 52 U/L (ref 39–117)
BUN: 50 mg/dL — ABNORMAL HIGH (ref 6–23)
Chloride: 109 mEq/L (ref 96–112)
Glucose, Bld: 89 mg/dL (ref 70–99)
Potassium: 5.2 mEq/L — ABNORMAL HIGH (ref 3.5–5.1)
Total Bilirubin: 0.3 mg/dL (ref 0.3–1.2)

## 2010-05-11 LAB — GLUCOSE, CAPILLARY
Glucose-Capillary: 80 mg/dL (ref 70–99)
Glucose-Capillary: 138 mg/dL — ABNORMAL HIGH (ref 70–99)

## 2010-05-11 MED ORDER — DIATRIZOATE MEGLUMINE 18 % UR SOLN: 20.0000 mL | Freq: Once | URETHRAL | Status: AC | PRN

## 2010-05-11 NOTE — H&P (Signed)
NAME:  [name] [name], [name] [name]            ACCOUNT NO.:  [id number]  MEDICAL RECORD NO.:  [id number]           PATIENT TYPE:  I  LOCATION:  [id number]                         FACILITY:  [place]:  [facility] A. Rage Beever, M.D.   DATE OF BIRTH:  1921/08/09  DATE OF ADMISSION:  05/09/2010 DATE OF DISCHARGE:                             HISTORY & PHYSICAL   CHIEF COMPLAINT:  Acute renal failure.  HISTORY OF PRESENT ILLNESS:  The patient is a very pleasant 77 year old gentleman who was in his usual state of health until he started feeling poorly 3 to 4 days ago.  He was feeling very weak and dizzy and actually on Saturday, May 05, 2010, his son checked his blood pressure and says it was 70/40.  His Norvasc and Coreg were placed on hold.  He had a little bit of nausea feeling and a slight amount of vomiting.  Over the ensuing 24 hours, it was noted that his urine turned bloody and therefore he presented to the emergency room on May 06, 2010. Urine appeared positive for urinary tract infection with too numerous to count white cells and too numerous to count red cells, large blood on the dipstick with negative nitrite and large leukocyte on the urinalysis.  He was given Bactrim and discharged home.  His blood pressure was normal on that day and on successive days.  He followed up in the urologist's office today and it was noted looking back that on May 06, 2010, his creatinine was actually 6.6 and is compared to a baseline creatinine of 1.9 from November 2011.  In the office today, his creatinine was rechecked and found to actually be improved to a level of approximately 4.5.  However, given the significant acute renal failure, we have elected to admit him to give him hydration and IV antibiotics and follow his creatinine closely.  He denies any shortness of breath. He had no other blood noted from any other place.  He says that he has had normal oral intake the last few days.  He states  that his urine output had dropped to about one-half of normal for 3 to 4 days. However, yesterday he felt that it picked up to about normal levels.  He has had no cough or cold symptoms.  He may have had some slight chest tightness.  At that time, he presented to the emergency room, but has not had any further chest pains.  PAST MEDICAL HISTORY: 1. Prostate cancer. 2. Bladder cancer, and he had a complete cystectomy and ureteroileal     conduit. 3. Lymphadenectomy in 2006. 4. He has had previous prostate resection as well. 5. He has a history of gout. 6. Hypertension. 7. Heart murmur. 8. Pacemaker. 9. Osteoporosis. 10.Depression. 11.Chronic kidney disease. 12.Hyperkalemia. 13.Slightly abnormal TSH.  PAST SURGICAL HISTORY: 1. Appendectomy. 2. Pacemaker. 3. Bilateral cataracts, removed in 2011.  ALLERGIES: 1. PRAVACHOL, ASPIRIN, LISINOPRIL, HYDRALAZINE, METOPROLOL,     ALENDRONATE, SIMVASTATIN.  MEDICATIONS: 1. Aspirin 81 mg daily. 2. Allopurinol 100 mg daily. 3. Amlodipine 5 mg daily, was stopped 4 to 5 days ago. 4. Colchicine 0.6 mg once daily as needed. 5.  Celexa 20 mg daily. 6. ICaps 1 daily. 7. Tylenol 500 mg as needed. 8. Vitamin D 1000 units daily. 9. He stopped Pravachol a some time ago as he felt it was making him     feel sick. 10.He takes fenofibrate 54 mg daily. 11.Coreg 6.25 mg twice a day, but this was stopped in the last 3 to 4     days as well.  SOCIAL HISTORY:  The patient is married and has been married since 1946. He has 3 children and 6 grandchildren.  He has 6th grade education.  He has worked on a farm.  He quit tobacco in 1954.  His wife Apolonio Schneiders has significant dementia.  He has a very supportive family.  He has no alcohol or drug use history.  FAMILY HISTORY:  Father died at age 65 of prostate cancer.  Mother died at age 66 of pancreatic cancer.  One sibling died of suicide.  REVIEW OF SYSTEMS:  As per the history of present  illness.  PHYSICAL EXAMINATION:  VITAL SIGNS:  Temperature is 97.4, pulse 81, respiratory rate 18, blood pressure 111/61, 98% saturation on room air. GENERAL:  Height is 172 cm, weight is 80.7 kg.  He is semi-supine in no acute distress in bed.  He is alert and oriented x4 and responds to questions appropriately.  Speech is fluent. NECK:  There is no JVD. LUNGS:  Clear to auscultation bilaterally with no wheezes, rales, or rhonchi. HEART:  Regular rate and rhythm with noted 2/6 systolic ejection murmur heard at the left and right sternal border in systole.  ABDOMEN:  Soft, nontender, nondistended with no mass or hepatosplenomegaly.  His ureteroileal conduit bag has clear yellow urine in the bag. EXTREMITIES:  There is trace lower extremity edema to 1+ bilateral lower extremity edema.  He moves extremities x5 and is grossly neurologically intact.  LABORATORY DATA:  Pending at this time.  However, from May 06, 2010, white count was 18.6, hemoglobin 11.9, platelet count 225,000. There are 85% segs, 5% lymphocytes, 8% monocytes.  Sodium is 135, potassium 4.5, chloride 101, CO2 24, BUN 92, creatinine was 6.64, BUN 109, GFR with a calcium of 8.6.  Urine microscopic at that time showed too numerous to count white cells, too numerous to count red cells and a dipstick showed large blood, large leukocytes, and 100 mg/dL protein but was otherwise negative.  ASSESSMENT AND PLAN:  An 77 year old gentleman with acute on chronic renal failure.  His creatinine actually seemed somewhat improved than it was a few days ago and according to his history, his urine output is actually improved somewhat in the last 24 hours, he most likely had some prerenal azotemia in association with a urinary tract infection.  We will admit him and hydrate him gently with normal saline at 50 mL an hour overnight and then we will reduce it.  We will check serial creatinines.  We will place him on Rocephin dose per  pharmacy for the urinary tract infection.  However, there is no clear urine culture that is obtained from his May 06, 2010 labs.  We will send a urine culture now, although he has been on Bactrim up until today.  He desires no code blue status and this will be honored and we have placed this on the chart.  He is undecided if he would desire hemodialysis if his kidneys were to fail further.  We will keep his blood pressure medications on hold.  We will place him on DVT  prophylactic dose and Lovenox.  We will keep him on an aspirin daily.  We will give him as- needed Tylenol.  We will continue his Celexa.  His current status is fair.     Kathrine Rieves A. Joylene Draft, M.D.     MAP/MEDQ  D:  05/09/2010  T:  05/09/2010  Job:  [id number]  Electronically Signed by Crist Infante M.D. on 05/11/2010 08:14:01 AM

## 2010-05-12 LAB — COMPREHENSIVE METABOLIC PANEL
ALT: 10 U/L (ref 0–53)
AST: 13 U/L (ref 0–37)
Alkaline Phosphatase: 47 U/L (ref 39–117)
CO2: 24 mEq/L (ref 19–32)
Chloride: 111 mEq/L (ref 96–112)
GFR calc non Af Amer: 25 mL/min — ABNORMAL LOW (ref >60)
Glucose, Bld: 101 mg/dL — ABNORMAL HIGH (ref 70–99)
Potassium: 4.6 mEq/L (ref 3.5–5.1)
Sodium: 142 mEq/L (ref 135–145)
Total Bilirubin: 0.3 mg/dL (ref 0.3–1.2)

## 2010-05-12 LAB — CBC
HCT: 33.4 % — ABNORMAL LOW (ref 39.0–52.0)
Hemoglobin: 10.6 g/dL — ABNORMAL LOW (ref 13.0–17.0)
MCHC: 31.7 g/dL (ref 30.0–36.0)
RBC: 3.44 MIL/uL — ABNORMAL LOW (ref 4.22–5.81)
WBC: 11 10*3/uL — ABNORMAL HIGH (ref 4.0–10.5)

## 2010-05-12 LAB — DIFFERENTIAL
Basophils Absolute: 0.1 10*3/uL (ref 0.0–0.1)
Lymphocytes Relative: 17 % (ref 12–46)
Monocytes Absolute: 0.6 10*3/uL (ref 0.1–1.0)
Neutro Abs: 7.8 10*3/uL — ABNORMAL HIGH (ref 1.7–7.7)
Neutrophils Relative %: 71 % (ref 43–77)

## 2010-05-12 LAB — GLUCOSE, CAPILLARY: Glucose-Capillary: 106 mg/dL — ABNORMAL HIGH (ref 70–99)

## 2010-05-14 LAB — IGG, IGA, IGM
IgG (Immunoglobin G), Serum: 1280 mg/dL (ref 694–1618)
IgM, Serum: 138 mg/dL (ref 60–263)

## 2010-05-14 LAB — IMMUNOFIXATION ADD-ON

## 2010-05-14 LAB — PROTEIN ELECTROPH W RFLX QUANT IMMUNOGLOBULINS
Alpha-1-Globulin: 6.9 % — ABNORMAL HIGH (ref 2.9–4.9)
Alpha-2-Globulin: 14.6 % — ABNORMAL HIGH (ref 7.1–11.8)
Gamma Globulin: 19 % — ABNORMAL HIGH (ref 11.1–18.8)

## 2010-05-22 ENCOUNTER — Other Ambulatory Visit: Payer: Self-pay | Admitting: Urology

## 2010-05-22 DIAGNOSIS — N200 Calculus of kidney: Secondary | ICD-10-CM

## 2010-05-28 NOTE — Discharge Summary (Signed)
NAME:  DEVLIN, ALKIRE            ACCOUNT NO.:  [id number]  MEDICAL RECORD NO.:  [id number]           PATIENT TYPE:  I  LOCATION:  [id number]                         FACILITY:  [facility]:  Jacia Sickman A. Kody Vigil, M.D.   DATE OF BIRTH:  19-Jul-1921  DATE OF ADMISSION:  05/09/2010 DATE OF DISCHARGE:  05/12/2010                              DISCHARGE SUMMARY   DISCHARGE DIAGNOSES: 1. Urinary tract infection. 2. Sepsis syndrome due to urinary tract infection. 3. Acute renal failure in the setting of chronic kidney disease with     the peak creatinine of 6.6 with creatinine trending down towards     baseline at the time of discharge. 4. Hypotension, improved at the time of discharge. 5. Ileal conduit urostomy with no evidence of significant stricture by     loop renogram. 6. Hypertension; however, he will remain off hypertension medication     at the current time. 7. Permanent pacemaker. 8. Past history of deep venous thrombosis associated with his     pacemaker, but no recurrence and he simply receive deep venous     thrombosis prophylaxis during this admission. 9. History of prostate cancer and history of bladder cancer. 10.Osteoporosis. 11.Hyperkalemia, improved at the time of discharge.  DISCHARGE MEDICATIONS: 1. Acetaminophen 650 mg p.o. q.6h. as needed. 2. Citalopram 20 mg daily. 3. Uloric 40 mg once daily. 4. Vitamin D 1000 units daily. 5. Aspirin 81 mg daily. 6. ICAP (I vitamin) one daily.  MEDICATIONS DISCONTINUED:  He is to stop taking: 1. Allopurinol. 2. Fenofibrate. 3. Carvedilol. 4. Amlodipine. 5. Percocet. 6. Septra.  PROCEDURES:  Loop renogram showing bilateral antral renal calculi, the largest on the right measuring 2.4 cm, normal-appearing ileal conduit, bilateral ureteral reflux as expected.  HISTORY OF PRESENT ILLNESS:  Jorja Loa is a pleasant 77 year old gentleman, who is in his usual state of health until he started feeling poorly 3-4 days prior to  admission.  He was weak and dizzy and was noted to have hypotension at home on February 11, 0000000 with a systolic pressure of 70.  His Norvasc and Coreg were placed on hold and he presented to the emergency room.  At that time, he was given Bactrim for urinary tract infection and discharged home; however, he did actually have a creatinine of 6.6 at that time, was seen in the office 2 days later by the urologist and a recheck creatinine was actually improved to 4.5; however, given this finding, he was electively admitted for further care for suspected urosepsis and acute renal failure on chronic kidney disease.  The baseline creatinine is approximately 1.9.  HOSPITAL COURSE:  Jorja Loa was admitted to a telemetry bed.  He remained stable from the cardiovascular and pulmonary standpoint during his stay.  He remained normotensive while in the hospital.  His blood pressure medicines were discontinued and he was hydrated gently with normal saline.  He did have an elevated potassium of 6.5 and was treated with insulin and Kayexalate and albuterol nebs for this.  He had a normal appetite and he was able to ambulate without difficulty.  His creatinine gradually improved to 2.4 on May 12, 2010.  He also had the loop renogram as noted above.  Neurology saw the patient on May 10, 2010 as well.  On May 12, 2010, he was deemed stable for discharge home.  DISCHARGE PHYSICAL EXAMINATION:  VITAL SIGNS:  Temperature 97.8, afebrile; pulse 75, blood pressure ranged from 130-147 over 77-82; 99% oxygen saturation on room air. GENERAL:  He is in no acute distress. HEART:  Regular rate and rhythm with a 3/6 systolic ejection murmur heard at the left and right sternal border. LUNGS:  Clear to auscultation bilaterally with no wheezes, rales or rhonchi. ABDOMEN:  Soft and nontender, nontender with stoma intact with no tenderness. EXTREMITIES:  There was trace lower extremity edema.  DISCHARGE  LABORATORY DATA:  White count 11.0, hemoglobin 10.6, platelet count 331.  Sodium 142, potassium 4.6, chloride 111, CO2 of 24, BUN 37, creatinine 2.4.  S-tap showed the possibility of a faint restricted band, which cannot be completely excluded in the beta-2 region, but there was no M-spike.  GFR was estimated at 25 mL/minute.  On May 12, 2010, total protein 6.4, albumin 2.6, AST 13, ALT 10, alk phos 47, total bili 0.3.  DISCHARGE INSTRUCTIONS:  Jorja Loa is to increase his activity slowly. He is to follow a low salt, low potassium, heart-healthy diet.  He is to call for followup visit in 1 week with Dr. Joylene Draft.  He is to keep his next visit with Dr. Idamae Schuller.  He is to call or return to emergency room with any significant problems.  He is to return to the lab on Wednesday, May 17, 2010 for lab work recheck.  BUN creatinine and potassium level.  Jorja Loa desired a no code blue status while he was in the hospital.     Elta Guadeloupe A. Joylene Draft, M.D.     MAP/MEDQ  D:  05/23/2010  T:  05/23/2010  Job:  [id number]  Electronically Signed by Crist Infante M.D. on 05/28/2010 07:14:11 PM

## 2010-06-21 ENCOUNTER — Encounter (INDEPENDENT_AMBULATORY_CARE_PROVIDER_SITE_OTHER): Payer: Self-pay | Admitting: *Deleted

## 2010-06-26 NOTE — Letter (Signed)
Summary: Appointment - [facility], [place]  [address] [place]   [place], [place] [address]   Phone: [phone]  Fax: [phone]     June 21, 2010 MRN: [id number]   [place], [place]  [address]   Dear Mr. HATCHER,  [facility] records indicate that it is past time to schedule a pacemaker check.Dr. Lovena Le recommended that you follow up with [place] in January. It is very important that we reach you to schedule this appointment. We look forward to participating in your health care needs. Please contact us at the number listed above at your earliest convenience to schedule your appointment.  If you are unable to make an appointment at this time, give [place] a call so we can update our records.     Sincerely,   Public relations account executive

## 2010-06-27 ENCOUNTER — Encounter: Payer: Self-pay | Admitting: Internal Medicine

## 2010-07-03 ENCOUNTER — Telehealth: Payer: Self-pay | Admitting: Cardiovascular Disease

## 2010-07-03 NOTE — Telephone Encounter (Signed)
ASKING FOR RECORDS-LAST OV NOTE,EKG,STRESS,ECHO,CC

## 2010-07-04 ENCOUNTER — Telehealth: Payer: Self-pay | Admitting: Cardiology

## 2010-07-04 ENCOUNTER — Other Ambulatory Visit: Payer: Self-pay | Admitting: Urology

## 2010-07-04 ENCOUNTER — Ambulatory Visit (HOSPITAL_COMMUNITY)
Admission: RE | Admit: 2010-07-04 | Discharge: 2010-07-04 | Disposition: A | Discharge status: Home / Self Care | Payer: Medicare Other | Source: Ambulatory Visit | Attending: Urology | Admitting: Urology

## 2010-07-04 ENCOUNTER — Encounter (HOSPITAL_COMMUNITY): Payer: Medicare Other

## 2010-07-04 DIAGNOSIS — Z01812 Encounter for preprocedural laboratory examination: Secondary | ICD-10-CM | POA: Insufficient documentation

## 2010-07-04 DIAGNOSIS — N200 Calculus of kidney: Secondary | ICD-10-CM | POA: Insufficient documentation

## 2010-07-04 DIAGNOSIS — Z01818 Encounter for other preprocedural examination: Secondary | ICD-10-CM | POA: Insufficient documentation

## 2010-07-04 DIAGNOSIS — Z01810 Encounter for preprocedural cardiovascular examination: Secondary | ICD-10-CM | POA: Insufficient documentation

## 2010-07-04 DIAGNOSIS — I519 Heart disease, unspecified: Secondary | ICD-10-CM

## 2010-07-04 DIAGNOSIS — Z950 Presence of cardiac pacemaker: Secondary | ICD-10-CM | POA: Insufficient documentation

## 2010-07-04 LAB — CBC
HCT: 39.5 % (ref 39.0–52.0)
MCHC: 32.2 g/dL (ref 30.0–36.0)
MCV: 96.1 fL (ref 78.0–100.0)
Platelets: 291 10*3/uL (ref 150–400)
RDW: 14.2 % (ref 11.5–15.5)
WBC: 8.8 10*3/uL (ref 4.0–10.5)

## 2010-07-04 LAB — BASIC METABOLIC PANEL
BUN: 34 mg/dL — ABNORMAL HIGH (ref 6–23)
Calcium: 9.5 mg/dL (ref 8.4–10.5)
GFR calc non Af Amer: 31 mL/min — ABNORMAL LOW (ref >60)
Glucose, Bld: 107 mg/dL — ABNORMAL HIGH (ref 70–99)
Sodium: 141 mEq/L (ref 135–145)

## 2010-07-04 LAB — SURGICAL PCR SCREEN: Staphylococcus aureus: NEGATIVE

## 2010-07-04 NOTE — Telephone Encounter (Signed)
[facility] LAST OFFICE NOTE FAX [phone]

## 2010-07-10 ENCOUNTER — Ambulatory Visit (HOSPITAL_COMMUNITY): Payer: Medicare Other

## 2010-07-10 ENCOUNTER — Inpatient Hospital Stay (HOSPITAL_COMMUNITY)
Admission: RE | Admit: 2010-07-10 | Discharge: 2010-07-13 | DRG: 661 | Disposition: A | Discharge status: Home / Self Care | Payer: Medicare Other | Source: Ambulatory Visit | Attending: Urology | Admitting: Urology

## 2010-07-10 ENCOUNTER — Other Ambulatory Visit: Payer: Self-pay | Admitting: Urology

## 2010-07-10 ENCOUNTER — Ambulatory Visit (HOSPITAL_COMMUNITY)
Admission: RE | Admit: 2010-07-10 | Discharge: 2010-07-10 | Disposition: A | Discharge status: Home / Self Care | Payer: Medicare Other | Source: Ambulatory Visit | Attending: Urology | Admitting: Urology

## 2010-07-10 DIAGNOSIS — N200 Calculus of kidney: Principal | ICD-10-CM | POA: Diagnosis present

## 2010-07-10 DIAGNOSIS — Z8551 Personal history of malignant neoplasm of bladder: Secondary | ICD-10-CM

## 2010-07-10 DIAGNOSIS — Z950 Presence of cardiac pacemaker: Secondary | ICD-10-CM

## 2010-07-10 DIAGNOSIS — R5082 Postprocedural fever: Secondary | ICD-10-CM | POA: Diagnosis not present

## 2010-07-10 DIAGNOSIS — Z01812 Encounter for preprocedural laboratory examination: Secondary | ICD-10-CM

## 2010-07-10 DIAGNOSIS — I10 Essential (primary) hypertension: Secondary | ICD-10-CM | POA: Diagnosis present

## 2010-07-10 MED ORDER — IOHEXOL 300 MG/ML  SOLN
50.0000 mL | Freq: Once | INTRAMUSCULAR | Status: AC | PRN
  Administered 2010-07-10: 5 mL

## 2010-07-11 ENCOUNTER — Ambulatory Visit (HOSPITAL_COMMUNITY): Payer: Medicare Other

## 2010-07-11 LAB — CBC
HCT: 32.7 % — ABNORMAL LOW (ref 39.0–52.0)
Hemoglobin: 10.4 g/dL — ABNORMAL LOW (ref 13.0–17.0)
MCH: 30.4 pg (ref 26.0–34.0)
MCV: 95.6 fL (ref 78.0–100.0)
RBC: 3.42 MIL/uL — ABNORMAL LOW (ref 4.22–5.81)
WBC: 8.2 10*3/uL (ref 4.0–10.5)

## 2010-07-11 LAB — BASIC METABOLIC PANEL WITH GFR
BUN: 21 mg/dL (ref 6–23)
CO2: 26 meq/L (ref 19–32)
Calcium: 7.8 mg/dL — ABNORMAL LOW (ref 8.4–10.5)
Chloride: 107 meq/L (ref 96–112)
Creatinine, Ser: 1.53 mg/dL — ABNORMAL HIGH (ref 0.4–1.5)
GFR calc non Af Amer: 43 mL/min — ABNORMAL LOW
Glucose, Bld: 101 mg/dL — ABNORMAL HIGH (ref 70–99)
Potassium: 4.2 meq/L (ref 3.5–5.1)
Sodium: 136 meq/L (ref 135–145)

## 2010-07-12 NOTE — Op Note (Signed)
NAME:  [name] [name], [name] [name] NO.:  [id number]  MEDICAL RECORD NO.:  [id number]           PATIENT TYPE:  O  LOCATION:  DAYL                         FACILITY:  [facility]  PHYSICIAN:  Marshall Cork. Jeffie Pollock, M.D.    DATE OF BIRTH:  Apr 27, 1921  DATE OF PROCEDURE:  07/10/2010 DATE OF DISCHARGE:                              OPERATIVE REPORT   PROCEDURE:  Right percutaneous nephrolithotomy, first stage, for 2.3-cm stone.  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  SURGEON:  Marshall Cork. Jeffie Pollock, M.D.  ANESTHESIA:  General.  SPECIMENS:  Stone fragments.  DRAIN:  22-French nephrostomy tube and 6-French safety catheter.  COMPLICATIONS:  None.  INDICATIONS:  [name] is an 77 year old white male with a history of bladder cancer with a prior cystectomy and ileal conduit urinary diversion.  He was hospitalized last year with a septic episode probably related to passage of left renal stone.  On followup studies, he was found to have a 2.3-cm partial staghorn stone in the right renal pelvis, and it was felt that removal was indicated to help prevent future infectious complications.  His preoperative culture grew 2 organisms both sensitive to Cipro, and he was started on Cipro yesterday  FINDINGS/PROCEDURE:  He was taken to the Radiology suite this morning where he was given Cipro and a right percutaneous nephrostomy tube was placed through lower pole calix by Dr. Diona Fanti.  He was then taken to the operating room where he was given Unasyn.  He was fitted with PAS hose.  A general anesthetic was induced in the supine position while he was on the holding room stretcher.  He was then rolled prone onto chest rolls with great care being taken to pad all pressure points, and the nephrostomy tube site was prepped and draped in the usual sterile fashion.  Time-out was performed at this point.  He was also  given 3 grams of Unasyn to extend his antibiotic coverage for the procedure.  A guidewire was then placed through the nephrostomy catheter under fluoroscopic guidance.  Once the wire was in place, the nephrostomy catheter was removed, and a 10-French peel-away sheath was placed over the wire and into the proximal ureter.  The inner core of the peel-away sheath was removed and a second wire was placed to the distal ureter. The peel-away sheath was removed, one of the wires was secured and a Trackmaster balloon was then placed over the wire until the tip was in the renal pelvis and the balloon was inflated to 16 atmospheres without difficulty.  The nephrostomy sheath was then inserted over the balloon to the renal pelvis and the balloon was removed.  The wire was left through the sheath.  A 24-French rigid nephroscope was then passed.  Some clots were removed with grasping forceps.  The stone was identified in the renal pelvis and was engaged with the lithoclast.  The bulk of the stone was removed using the ultrasonic feature; however, it was broken into fragments with a pneumatic component and those fragments were removed using 3- and 2- prong grasping  forceps.  Once the bulk of the stone fragments were removed, a flexible nephroscope was used to inspect the remaining calyces.  This inspection was aided by the instillation of contrast during the procedure.  There were some small fragments in mid pole calix that were flushed back into the renal pelvis, but the upper pole calyces were clear.  At this point, the rigid scope was reinserted.  A couple of 4-5 mm fragments were removed.  The final inspection revealed no residual stone material.  A 22-French Foley catheter was placed through the nephrostomy tube sheath over the wire.  The wire was removed.  The balloon was filled with 3 cc of sterile fluid.  The catheter was then instilled with contrast confirming good position and no  evidence of residual filling defects.  Once the Foley catheter was in place and the nephrostomy tube sheath had been removed, a Compy 6-French safety catheter was placed over the safety wire and positioned in the distal ureter.  The wire was removed.  At this point, the tubes were secure to the skin with 2-0 silk suture. The nephrostomy tube was placed to straight drainage.  The safety catheter was capped.  The drapes were removed.  A dressing of 4x4s, ABDs and OpSite was placed.  The patient was then rolled back supine on the recovery room stretcher and his anesthetic was reversed.  He was moved to the recovery room in stable condition.  There were no complications. Blood loss was approximately 25 mL.     Marshall Cork. Jeffie Pollock, M.D.     JJW/MEDQ  D:  07/10/2010  T:  07/10/2010  Job:  [id number]  Electronically Signed by Irine Seal M.D. on 07/12/2010 02:16:58 PM

## 2010-07-14 LAB — STONE ANALYSIS

## 2010-08-07 NOTE — Discharge Summary (Signed)
NAME:  JAY, RADACH NO.:  [id number]   MEDICAL RECORD NO.:  [id number]          PATIENT TYPE:  INP   LOCATION:  1411                         FACILITY:  [facility]   PHYSICIAN:  Ermalene Searing. Philip Aspen, M.D.DATE OF BIRTH:  1922-02-20   DATE OF ADMISSION:  08/29/2007  DATE OF DISCHARGE:  09/06/2007                               DISCHARGE SUMMARY   PERTINENT FINDINGS:  The patient is an 77 year old white man with a  history of bladder cancer, treated approximately 3 years ago with  cystectomy and ileal conduit.  He also has a history of hypertension,  osteoporosis, and depression.  For the past 2-3 weeks, he has had  intermittent nausea and vomiting and diarrhea.  His diarrhea was felt  possibly due to vitamin D 50,000 units capsules, so this was  discontinued.  However, he developed a fever to 103 and was seen in an  urgent care center and found to be hypotensive, so he was transported to  the [facility] Emergency Room for evaluation.  In the emergency room,  his blood pressure was 90/43.  He was given 2 liters of IV fluids and  started on dopamine.  The patient noted that he had had a recent flare  of gout and had been taking Indocin.  He had also noted a decrease in  his urostomy output over the past 1-2 days and decreased food and fluid  intake.   MEDICATIONS PRIOR TO ADMISSION:  1. Lexapro 10 mg daily.  2. Fosamax 70 mg once weekly.  3. Norvasc 5 mg daily.  4. Indocin and colchicine p.r.n.   PAST MEDICAL HISTORY:  1. In 2006, bladder carcinoma with resection of the bladder.  2. Hypertension.  3. Osteoporosis.  4. Depression.  5. Gout.   INITIAL PHYSICAL EXAMINATION:  VITAL SIGNS:  Temperature 98.6 following  Tylenol, pulse 93, respirations 18, blood pressure 93/40, increased to  147/63 on 5 mcg/minute of dopamine following 2 liters of fluid boluses,  pulse oxygen saturation 97% on room air.  GENERAL:  He is an elderly white male, who is in no acute  distress.  HEENT:  Within normal limits.  NECK:  Supple, without jugular venous distention or carotid bruit.  CHEST:  Clear to auscultation.  HEART:  Had a regular rate and rhythm, with a systolic ejection murmur  of grade 2/6 at the left sternal border.  ABDOMEN:  Had normal bowel sounds and no tenderness.  The external  bladder ileostomy site had dark urine within it.  EXTREMITIES:  Without cyanosis, clubbing, or edema.  NEUROLOGIC:  Nonfocal.   INITIAL LABORATORY STUDIES:  White blood cell count 21,000, hemoglobin  11, hematocrit 32, platelets 305, BUN 94, creatinine 7.  Serum potassium  5, serum bicarbonate 15, glucose 108.  Urinalysis had triple-phosphate  crystals, with 2-6 white blood cells and many bacteria.  Chest x-ray  showed old granulomatous disease, with no acute findings.  Abdomen  ultrasound showed bilateral mild hydronephrosis, with a stone at the  left upper pole.   HOSPITAL COURSE:  The patient was admitted to an ICU bed and started on  Zosyn  and dopamine to maintain his blood pressure with IV fluids.  He  had a persistently slow heart rate when off of dopamine, ranging in the  high 30s-40s, and was seen by a cardiology consultant, who noted that  his EKG had marked sinus bradycardia, and he was completely  asymptomatic.  It was unclear what benefit a pacemaker would have for  him, other than perhaps increasing his endurance, so pacemaker placement  was not recommended at this time.  He was also seen by a nephrology  consultant, who felt that the acute renal insufficiency was likely  secondary to volume depletion from sepsis, and that the bilateral mild  hydronephrosis could be a normal finding after cystectomy with the ileal  conduit procedure.  A urology consultant also evaluated him and  recommended continued IV fluids and IV antibiotics.  He did not  recommend percutaneous nephrostomy tube placement, given that a CT scan  of the abdomen and pelvis only showed  bilateral mild hydronephrosis  without an obstructing stone.  The patient continued to improve on IV  fluids and antibiotics.  His urine culture had multiple organisms within  it, but two blood cultures grew out Morganella morganii that was  resistant to Zosyn and sensitive to ceftazidime, ciprofloxacin,  gentamicin, and Septra, so he was switched from Zosyn to ciprofloxacin  IV.  His condition continued to improve on IV ciprofloxacin.   PROCEDURES:  1. Renal ultrasound exam.  2. CT scan of the abdomen and pelvis without IV contrast.   COMPLICATIONS:  None.   CONDITION ON DISCHARGE:  He feels fine.  He has been eating nearly all  of the food presented to him.  He has no abdominal pain or diarrhea and  has not felt lightheaded with ambulating on the ward with only standby  assistance.  He also denied shortness of breath or chest discomfort.   MOST RECENT PHYSICAL EXAMINATION:  VITAL SIGNS:  Blood pressure 155/70,  pulse 40-44, respirations 20, temperature 97.7, pulse oxygen saturation  97% on room air.  GENERAL:  He is an elderly white male who is in no apparent distress.  CHEST:  Clear to auscultation.  HEART:  Had a regular rate and rhythm, with a systolic ejection murmur  grade 1/6 at the left sternal border.  ABDOMEN:  Had normal bowel sounds and no tenderness.  EXTREMITIES:  Had bilateral trace leg edema.  NEUROLOGIC:  He was alert and well oriented.  He could move all  extremities quite well and was able to change from a sitting position to  a standing position and walk in his room with only standby assistance.   MOST RECENT LABORATORY TESTS:  Serum sodium 141, potassium 4.7, chloride  110, carbon dioxide 26, BUN 40, creatinine 2.17, glucose 83, total  protein 6.6, albumin 2.4.  White blood cell count 12.5, hemoglobin 11,  hematocrit 32.7, platelets 255.   DISCHARGE DIAGNOSES:  1. Urosepsis with Morganella morganii.  2. Acute renal insufficiency, much improved.  3.  Depression.  4. Osteoporosis.  5. Gout.  6. Macular degeneration.  7. Hypertension.  8. Osteoarthritis.   DISCHARGE MEDICATIONS:  1. Lexapro 10 mg p.o. daily.  2. Fosamax 70 mg p.o. q.2 weeks.  3. Colchicine 0.6 mg p.o. daily.  4. Ocuvite 1 tablet p.o. daily.  5. Norvasc 5 mg p.o. daily.  6. Tylenol 500 mg, take 1 tablet p.o. q.i.d. p.r.n. pain.  7. Ciprofloxacin 500 mg p.o. daily for 10 days.   He was advised not to  take nonsteroidal antiinflammatory drugs such as  Indocin, Advil, or Motrin, as this could harm his kidneys.   DISPOSITION AND FOLLOWUP:  The patient was advised to have a follow-up  appointment with Dr. Crist Infante in approximately 8-10 days following  discharge.  He should call our office sooner if he has recurrent fever,  lightheadedness, nausea, vomiting, or diarrhea.           ______________________________  Ermalene Searing. Philip Aspen, M.D.     DGP/MEDQ  D:  09/06/2007  T:  09/07/2007  Job:  [id number]   cc:   Elta Guadeloupe A. Perini, M.D.  [phone]   Thayer Headings, M.D.  Fax: [phone]   Sol Blazing, M.D.  Fax: [phone]   Thana Farr. Karsten Ro, M.D.  Fax: [phone]

## 2010-08-07 NOTE — Discharge Summary (Signed)
NAME:  [name] [name], [name] [name] NO.:  [id number]   MEDICAL RECORD NO.:  [id number]          PATIENT TYPE:  OIB   LOCATION:  2038                         FACILITY:  [facility]   PHYSICIAN:  Kaylyn Lim., M.D.DATE OF BIRTH:  30-Dec-1921   DATE OF ADMISSION:  12/10/2007  DATE OF DISCHARGE:  12/11/2007                               DISCHARGE SUMMARY   DISCHARGE DIAGNOSES:  1. Sick sinus syndrome status post dual-chamber permanent pacemaker      implant.  2. Hypertension.  3. History of gouty arthritis.  4. Anxiety/depression.   HISTORY OF PRESENT ILLNESS:  Mr. Theiss is a very pleasant 77-year-  old white male who presented with fatigue and tiredness.  He was noted  to be bradycardic with heart rates in the upper 30s and low 40s.  He was  referred for pacemaker implant.   HOSPITAL COURSE:  The patient was admitted and underwent dual-chamber  permanent pacemaker implant on December 10, 2007.  He tolerated his  procedure well.  His postop course was uncomplicated.  On his  postprocedure day #1, his device checked out normal with good threshold  in the atrial and ventricular chambers of 0.5 V at 0.4 ms in both  chambers.  His sensing also was normal as well as his impedances.  He  was primarily A pacing and B sensing.  His blood pressure was stable at  135/70 with heart rate being in 60 in an A paced B sensed rhythm.  His  O2 sats were 95%.  His postprocedure chest x-ray showed appropriate  position of his atrial and ventricular leads with no pneumothorax.  He  will be discharged home today to continue the following medications.  1. Lexapro 10 mg half tablet daily.  2. Allopurinol 100 mg daily.  3. Amlodipine 5 mg daily.  4. Colchicine 0.6 mg daily.  5. Multivitamin once daily.  He is also to use Tylenol Extra Strength      as needed for pain.  His postprocedure restrictions should be      routine post pacemaker implant restrictions.  We specifically      discussed  not getting his wound wet for the next week, not lifting      anything more than 5 pounds with his left arm or lifting his left      arm above shoulder height for the next 2 weeks.  A restriction      sheet was given to the patient.  He is to increase his activity      slowly.  He is to call the office, if he has any questions or      concerns.  His followup appointment will be with Dr. Verlon Setting at      [facility] in 1 week for wound check.      Kaylyn Lim., M.D.  Electronically Signed     TWK/MEDQ  D:  12/11/2007  T:  12/11/2007  Job:  [id number]   cc:   Thayer Headings, M.D.

## 2010-08-07 NOTE — Consult Note (Signed)
NAME:  [name] [name], [name] [name] NO.:  [id number]   MEDICAL RECORD NO.:  [id number]          PATIENT TYPE:  INP   LOCATION:  1225                         FACILITY:  [place]:  Sol Blazing, M.D.DATE OF BIRTH:  14-Jan-1922   DATE OF CONSULTATION:  DATE OF DISCHARGE:                                 CONSULTATION   REASON FOR CONSULTATION:  Elevated creatinine.   HISTORY:  This is an 77 year old white male with a history of  hypertension, depression, osteoporosis and bladder cancer with bladder  resection in 2006.  He says his prostate was removed at that time also  and an ileal conduit was placed and he has had this urostomy ever since  in the right lower quadrant.  He has not any problems with functioning,  kidney stones, or kidney failure in the past.  The patient states that  for the last 2 weeks he has been troubled with nausea, vomiting, and  diarrhea.  The emergency room physician intake report says that the  vomiting was only going on for a day.  The patient was found to have a  creatinine of  7 and borderline hypotension with a blood pressure in the  90s.  He was admitted for further evaluation and treatment.  He also had  a white blood count of 20,000 and a high temperature by history that day  of 103,  associated with chills.   Overnight, the patient was hydrated with lots of crystalloid fluids and  creatinine is down to 6.0 today.  He is making good urine, at least 100  mL an hour currently.  Blood pressure is a little bit better, 100-110,  and his Norvasc has been held.   Particularly, the patient denies any recent use of over-the-counter pain  medicines, NSAIDs, Indocin or  anything other than Tylenol for pain.  He  has been taking gout medicine which is likely colchicine.   PAST MEDICAL HISTORY:  1. Radical cystectomy in 2006 with right lower quadrant ileal conduit.  2. Hypertension.  3. Osteoporosis.  4. Depression.   PAST SURGICAL HISTORY:   As above.   ALLERGIES:  None known.   HOME MEDICATIONS:  1. Lexapro.  2. Fosamax.  3. Norvasc 5 mg daily.   CURRENT MEDICATIONS:  1. IV Zosyn.  2. Lexapro.  3. Protonix.  4. Vancomycin.  5. P.r.n.  medicines.   SOCIAL HISTORY:  The patient lives in [place] with his wife.  He  has 3 children.  He worked at [employer] and has been retired. He  has not smoked since the 1950s.   FAMILY HISTORY:  Both parents died of cancer in their 68s and 80s.  No  renal failure in the family.   REVIEW OF SYSTEMS:  Denies any current fever, chills, otherwise as  above.  HEENT:  Unremarkable.  ENT:  Unremarkable.  CARDIORESPIRATORY:  No cough, shortness of breath, purulent sputum production or chest pain.  GI:  As above.  He is not having any diarrhea today and he is not  currently nauseated GU:  Denies any recent change in urine  color, any  foul odor or cloudiness in the bag.  MUSCULOSKELETAL:  Denies myalgia,  arthralgia or ankle swelling.  NEUROLOGIC:  Denies focal numbness or  weakness.   PHYSICAL EXAM:  Blood pressure currently is 120/80, O2 sat 97% on room  air, pulse 80, respirations 18.  He had a temperature of 103 on  presentation, currently 99.0.  The patient is alert and oriented, in no acute distress, elderly white  male lying in bed at 45 degrees.  SKIN:  Without rash.  HEENT:  Throat is slightly dry.  NECK:  Supple with distended neck veins.  CHEST:  Clear throughout.  No rales, rhonchi, or wheezing.  CARDIAC:  Regular rate and rhythm.  There is a loud 3/6 systolic  ejection murmur loudest at the right upper sternal border.  No S3, no S4  or rub.  ABDOMEN:  Soft, nontender, slightly distended, tympanitic,  hyperactive bowel sounds.  No CVA tenderness.  No abdominal bruits.  EXTREMITIES:  No femoral bruits, 1+ pulses in both feet.  No peripheral  edema.  NEUROLOGICAL:  Alert and oriented x3.  No asterixis.   LABS:  Random cortisol 26.  TSH 1.45 micro international  units per  milliliter, phosphorus 5.5 mg/dL, sodium 136, potassium 5.6, BUN 85,  creatinine 6.07.  Estimated GFR 9 mL per minute.  AST and ALT are  normal.  Albumin 2.3, calcium 7.7.  Bilirubin is normal.  Coags are  normal.  White blood count 33,000, hemoglobin 10, platelets 283.  Urinalysis:  100 protein, large leukocytes on dipstick.  On microscopic,  3 to 6 white blood cells, 0 to 2 red blood cells, many bacteria and  triple phosphate crystals, pH on the urine was high at 8.5   IMPRESSION:  1. Renal failure, most likely acute:  We do not have any old      creatinines in the system.  Suspect this is mostly due to volume      depletion.  He was volume depleted, febrile and hypotensive on      admission, and the hypotension may have contributed to the renal      problems.  So far, he is making good urine and creatinine has come      down with fluids.  He does have mild hydronephrosis on his      ultrasound,  however,  urology has already evaluated him and on      initial evaluation they do not necessarily think that this reflects      an obstructive situation as it can be seen with ileal conduit in      the absence of obstruction.  They have ordered further testing, in      particular, a noncontrast CT scan of the abdomen and pelvis to look      at the ureters.  2. Volume depletion:  Getting appropriate fluids.  3. Hypotension -  resolved.  4. History of radical cystectomy with ileal conduit (2006) for bladder      cancer.  5. Leukocytosis:  On empiric antibiotics.  Chest x-ray negative.      Urinalysis slightly positive.  Culture is pending, although the      Urology note also says that these cultures from ileal conduits are      not going to be very reliable in terms of acute infection.  6. Hypertension:  On Norvasc at home, currently being held.   RECOMMENDATIONS:  For the time being, I would not do anything else other  than continue volume repletion.  He has already had an  ultrasound and  urinalysis.  Would continue to hold and avoid any NSAIDs , ACE  inhibitors,  ARBs or IV contrast.  He has good urine output and I do not think he  needs dialysis at this time; hopefully,  his renal function will  continue to improve.  If it  does not, we may need to do further  testings for any underlying intrinsic renal disease, which I currently  do not think he has.      Sol Blazing, M.D.  Electronically Signed     RDS/MEDQ  D:  08/30/2007  T:  08/30/2007  Job:  [id number]

## 2010-08-07 NOTE — Procedures (Signed)
DUPLEX DEEP VENOUS EXAM - UPPER EXTREMITY   INDICATION:  Follow up left upper extremity DVT.   HISTORY:  Edema:  No.  Trauma/Surgery:  Status post pacemaker.  Pain:  No.  PE:  No.  Previous DVT:  Left subclavian and basilic vein thrombus, which was  first documented 12/22/07.  Anticoagulants:  Yes.  Other:   DUPLEX EXAM:                                             Bas/                IJV   SCV     AXV    BrachV  Ceph V                R  L  R   L   R  L   R   L   R  L  Thrombosis    o  o  o   +      o       o      +  Spontaneous   +  +  +   P      +       +      o  Phasic        +  +  +   +      +       +      o  Augmentation  +  +  +   +      +       +      o  Compressible  +  +  +   P      +       +      P  Competent  Legend:  + - yes  o - no  p - partial  D - decreased   IMPRESSION:  1. 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  2. Partially occlusive chronic thrombus noted in the left basilic vein      extending from the mid brachium to the antecubital fossa level.  3. No evidence of progression from previous study.      Preliminary report faxed to Dr. Janyth Pupa office.    ___________________________________________  Rosetta Posner, M.D.   AS/MEDQ  D:  06/03/2008  T:  06/03/2008  Job:  [id number]

## 2010-08-07 NOTE — Procedures (Signed)
DUPLEX DEEP VENOUS EXAM - UPPER EXTREMITY   INDICATION:  Left upper arm secondary to known left upper extremity DVT.   HISTORY:  Edema:  Resolved since previous study.  Trauma/Surgery:  Pacemaker, left arm.  Pain:  No.  PE:  On 06/03/08, duplex revealed minimal left subclavian vein thrombus  near the clavicle and a chronic thrombus from the left basilic vein from  the mid brachium to the antecubital fossa.  Previous DVT:  No.  Anticoagulants:  Patient is on Coumadin.  Other:   DUPLEX EXAM:                                             Bas/                IJV   SCV     AXV    BrachV  Ceph V                R  L  R   L   R  L   R   L   R  L  Thrombosis    o  o  o   o      o       o      p/o  Spontaneous      +  +   +      +       +      p/+  Phasic           +  +   +      +       +      o/+  Augmentation     +  +   +      +       +      o/+  Compressible  +     +   +      +       +      o/+  Competent  Legend:  + - yes  o - no  p - partial  D - decreased     IMPRESSION:  1. No evidence of acute left arm deep venous thrombosis or superficial      venous thrombosis.  2. Previously documented nonocclusive chronic thrombus is seen in the      left subclavian vein distal to the cephalic vein origin, extending      through the basilic veins to the antecubital fossa.  3. No significant change from previous study performed on 06/03/08.       ___________________________________________  Jessy Oto. Fields, MD   MC/MEDQ  D:  10/03/2008  T:  10/03/2008  Job:  [id number]

## 2010-08-07 NOTE — Consult Note (Signed)
NAME:  [name] [name] NO.:  [id number]   MEDICAL RECORD NO.:  [id number]          PATIENT TYPE:  INP   LOCATION:  1225                         FACILITY:  [facility]   PHYSICIAN:  Thayer Headings, M.D. DATE OF BIRTH:  [birth year]   DATE OF CONSULTATION:  09/01/2007  DATE OF DISCHARGE:                                 CONSULTATION   [name] [name] is an 77 year old gentleman who is admitted with fever,  diarrhea, hypotension and renal failure.  He was found to have  bradycardia and we are asked to see him for further evaluation.   The patient has a history of bladder cancer.  He is status post ileal  conduit.  He has been doing quite poorly and developed urosepsis.  He  required pressors and multiple antibiotics and is slowly improving.  He  has had problems with bradycardia recently.  He is basically  asymptomatic.  He denies any chest pain, shortness breath, syncope or  presyncope.  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  He has not had any episodes of chest pain, shortness of breath, syncope  or presyncope.   CURRENT MEDICATIONS:  Lexapro, Protonix, Zosyn, vancomycin.   ALLERGIES:  He has no known drug allergies.   PAST MEDICAL HISTORY:  1. Bladder cancer.  He is status post ileal conduit.  2. Hypertension.  3. Gout.  4. History of depression.   SOCIAL HISTORY:  The patient used to work at [employer].  He also used  to farm.   FAMILY HISTORY:  Noncontributory.   REVIEW OF SYSTEMS:  Negative.   On exam he is an elderly gentleman in no acute distress.  He is alert  and oriented x3 and his mood and affect are normal.  His heart rate is 50 and is sinus.  He has no JVD, no thyromegaly.  LUNGS:  Clear.  HEART:  Regular rate, S1 and S2.  ABDOMINAL EXAM:  Reveals good bowel sounds and is nontender.  EXTREMITIES:  No clubbing, cyanosis or edema.  NEURO EXAM:  Nonfocal.   His white blood cell count is 15.1.  His hematocrit is 27.3.  His  sodium  is 144, potassium is 4.5, chloride 114, CO2 is 18, BUN 54, creatinine is  3.6.   His telemetry monitor reveals sinus bradycardia.   IMPRESSION AND PLAN:  Bradycardia.  The patient is completely  asymptomatic at this point.  I would continue to follow him.  He may  ultimately benefit from having a pacemaker placed but let us get him  over this acute illness first.  I do not think that this degree of  bradycardia is slowing down his progress or keeping him from improving.  As he gets more active, he may find that he needs a pacemaker for better  endurance, but I think that he will be fine with a pacemaker for now.  He does not appear to have any high-degree block.  He also does not  appear to have any symptoms of ischemia that would be contributing to  this.  We will continue to follow him with you.  ______________________________  Thayer Headings, M.D.     PJN/MEDQ  D:  09/01/2007  T:  09/01/2007  Job:  [id number]   cc:   Elta Guadeloupe A. Perini, M.D.  Fax: [phone]

## 2010-08-07 NOTE — Procedures (Signed)
DUPLEX DEEP VENOUS EXAM - UPPER EXTREMITY   INDICATION:  Left upper extremity DVT.   HISTORY:  Edema:  No.  Trauma/Surgery:  Status post pacemaker.  Pain:  No.  PE:  No.  Previous DVT:  Left subclavian and basilic vein on Q000111Q.  Anticoagulants:  Yes.  Other:   DUPLEX EXAM:                                             Bas/                IJV   SCV     AXV    BrachV  Ceph V                R  L  R   L   R  L   R   L   R  L  Thrombosis    0  0  0   +      0       0      +  Spontaneous   +  +  +   0      0       +      0  Phasic        +  +  +   +      +       +      0  Augmentation  +  +  +   +      +       +      0  Compressible  +  +  +   P      +       +      P  Competent  Legend:  + - yes  o - no  p - partial  D - decreased    IMPRESSION:  1. Partially occlusive thrombus noted in the left subclavian vein just      proximal to the cephaloclavicular junction.  2. Partially occlusive chronic thrombus is noted in the left basilic      vein extending from the mid brachium to the antecubital fossa      level.  3. Improvement of the left upper extremity thrombus noted when      compared to the previous examination on 12/22/2007.   A preliminary report was called to Dr. Janyth Pupa office on 02/24/2008.       ___________________________________________  Rosetta Posner, M.D.   CH/MEDQ  D:  02/24/2008  T:  02/24/2008  Job:  [id number]

## 2010-08-07 NOTE — H&P (Signed)
NAME:  JAZIR, LICHTENWALNER NO.:  [id number]   MEDICAL RECORD NO.:  [id number]          PATIENT TYPE:  INP   LOCATION:  0102                         [facility]ACILITY:  [facility]   PHYSICIAN:  Haywood Pao, M.D.DATE OF BIRTH:  1921-10-06   DATE OF ADMISSION:  08/29/2007  DATE OF DISCHARGE:                              HISTORY & PHYSICAL   CHIEF COMPLAINT:  Fevers, hypotension.   HISTORY OF PRESENT ILLNESS:  The patient is an 77 year old white male  with a history of bladder cancer approximately 3 years ago treated with  surgery requiring ileal conduit, hypertension, and osteoporosis as well  as depression.  He has had a 2-3-week course of episodic nausea,  vomiting, and diarrhea.  He last saw Dr. Joylene Draft about a week ago, and it  was determined that the vitamin D 50,000 units that he was taking may be  causing some of his GI distress.  This was discontinued; however, he had  more episodes yesterday evening and had some pain for which he took  Tylenol.  The pain did not seemingly get all the way better, and he was  still feeling weak.  His family took him to Urgent Care.  He was seen,  examined, and found to have a fever of 103 and to be somewhat  hypotensive.  He was, therefore, transferred to the [facility]  Emergency Room where his blood pressure was 90/43 on admission.  He was  given 2 liters of fluid and started on dopamine.  Laboratory tests were  done, and I was called to admit the patient.   On arrival, the patient appeared to be relatively comfortable.  He was  in the process of getting an abdominal ultrasound at that time.  He  indicated that he had been having some flairs of gout as well for which  he had been taking what was thought to be Indocin, and he did not have  the bottle with him.  It is absent from his medication list which his  family carries.  He indicates that the pain that he was having on his  right side seems to be somewhat dull but deep.  He did  not have this  sort of pain before.  He has also had some decrease in his urostomy  output over the past day or so; indicates that he felt some chills and  fever earlier today.   ALLERGIES:  No known drug allergies.   MEDICATIONS:  1. Lexapro 10 mg daily.  2. Fosamax weekly.  3. Norvasc 5 mg daily.  4. Possible Indocin or other NSAID or colchicine, not currently on      list.   PAST MEDICAL HISTORY:  1. History of bladder cancer with resection in 2006.  The patient      denies any chemotherapy or radiation; indicates that he has been      cancer free since that time and has not had any renal issues      related to it.  2. Hypertension.  3. Osteoporosis.  4. History of depression.  5. Gout   PAST SURGICAL HISTORY:  Ileostomy  the above.   SOCIAL HISTORY:  The patient lives in [place] with his wife.  He  is married with three children.  He formerly worked at [employer]  and other [place] work.  He has been retired for a long time.  Nonsmoker  since the 1950s and does not drink.   FAMILY HISTORY:  Both of his parents died of cancer; his father in his  late 17s and his mother in the mid 64s.  He is not certain of their  cancer type other than it was most likely abdominal.  He has three  sisters, one who is alive and 2 who are deceased.  His older sister died  of cancer, and his younger sister of suicide.   REVIEW OF SYSTEMS:  The patient notes some fevers and chills earlier  today but is not currently having them.  Denies any headache.  He  indicates some nausea earlier but has not had any vomiting or diarrhea  today.  Indicates that he has had some gout flairs within his ankles,  but these are currently feeling better.  Denies any chest pain,  shortness of breath, dyspnea on exertion, although he feels overall weak  and he has noted his urostomy output is considerably less than usual.   PHYSICAL EXAMINATION:  VITAL SIGNS:  Temperature 98.6 following Tylenol.  He  had a temperature of 103 over at Urgent Care.  Pulse 93, respiratory  rate 18, blood pressure on arrival was 93/40.  Blood pressure currently  147/63 on 5 mics of dopamine and 2 liters of fluid boluses.  Satting 97%  on room air.  GENERAL:  No acute distress.  HEENT: Normocephalic, atraumatic.  PERRLA.  EOMI.  ENT is within normal  limits.  NECK:  Supple with no lymphadenopathy, JVD or bruit.  HEART:  Regular rate and rhythm.  The patient has a grade 2/6 systolic  ejection murmur heard best at the right upper sternal border.  The  patient indicates that he has had this murmur a long time.  LUNGS:  Clear to auscultation bilaterally.  ABDOMEN: Soft, nontender.  The patient has ileostomy site on the right  which does not appear to be erythematous.  He has a scant amount of  urine which is fairly dark and concentrated remaining within his  urostomy bag.  EXTREMITIES: No clubbing, cyanosis or edema.  SKIN: No gross lesions noted.  No petechiae or hemorrhages.   LABS:  White count 21,000, hemoglobin 11.1, hematocrit 32.3, platelets  305, BUN and creatinine are 94 and 7 respectively.  Potassium normal at  5.  Bicarb normal at 15.  Glucose is 108.  Urinalysis shows triple  phosphate crystals and 2-6 white blood cells, many bacteria; however,  this was taken from the bag and not directly obtained using a catheter  through his ileostomy.  Chest x-ray shows old granulomatous disease with  no acute findings.  Ultrasound early read performed by myself at present  showed a stone in the right mid ureter with mild hydronephrosis and a  stone at the left upper pole with mild hydronephrosis as well.   ASSESSMENT/PLAN:  1. Hypotension with renal failure.  May possibly be related to      obstructive uropathy.  It is difficult to tell at this level of      hydronephrosis as he has had an ileal conduit performed.  In fact      may not perceive the pain of a kidney stone  quite the same as      somebody  who has not had extensive resection of this area.  In fact      the nausea, vomiting, and abdominal pain may have been more kidney      stone related.  I do not know how this would necessarily account      for loose bowel movements, unless in fact the medicine he was      taking for gout was colchicine which may be causing these as well.      This could also further worsen his kidney disease.  I have spoken      with Dr. Karsten Ro with urology to see the patient and to review the      ultrasound.  He has indicated that in cases where there is a      history of resection that any sort of stenting may need to be      performed by interventional radiology.  In addition, the patient      possibly has had sepsis given his low blood pressure and high white      count as well as fever.  The patient was given Rocephin in the      emergency room.  I will cover him with Zosyn 2.25 grams q. 8, which      I believe is appropriate for his renal dysfunction. I will have the      pharmacy review this.  If he does not appear to be getting better,      I will add vancomycin to this, but I am hesitant to do it given his      acute renal failure. I do not wish to make his issues worse.  Will      aggressively hydrate him.  He has been given 2 liters of fluid      already and is on dopamine.  He will require an intensive care unit      stay because of this and will remain in the intensive care unit      until his blood pressures are stable and he is off  pressors.  2. Acute renal failure.  Renal ultrasound has been performed.  He does      not have a post renal obstruction from prostate as the prostate is      not present, and having an ileostomy he does not require a Foley      catheter.  He will most likely respond reasonably well with      hydration as well.  Urology consult pending as noted above.  3. Hypotension.  Will take him off his Norvasc as this is clearly      unnecessary at this point.  4.  Osteoporosis.  Fosamax is not indicated in somebody with his level      of renal dysfunction.  We will hold this for now.  5. Will continue the patient on Lexapro for depression.  6. Will use sequential compression devices for prevention of deep      venous thrombosis as the patient may be undergoing a percutaneous      procedure in the next 24 hours, at neurology and interventional      radiology discretion and do not wish him to be on blood thinners      for this.  In addition, I have added Protonix for gastrointestinal      prophylaxis.      Haywood Pao, M.D.  Electronically  Signed     RWT/MEDQ  D:  08/29/2007  T:  08/29/2007  Job:  [id number]   cc:   Elta Guadeloupe A. Perini, M.D.  Fax: [phone]   Thana Farr. Karsten Ro, M.D.  Fax: [phone]

## 2010-08-07 NOTE — Procedures (Signed)
DUPLEX DEEP VENOUS EXAM - UPPER EXTREMITY   INDICATION:  Left arm pain and swelling, status post pacemaker  placement.   HISTORY:  Edema:  Yes.  Trauma/Surgery:  Yes.  Pain:  Yes.  PE:  No.  Previous DVT:  No.  Anticoagulants:  Other:   DUPLEX EXAM:                                             Bas/                IJV   SCV     AXV    BrachV  Ceph V                R  L  R   L   R  L   R   L   R  L  Thrombosis    o  o      +      0       o      +  Spontaneous   +  +  +   0      +       +      0  Phasic        +  +  +   0      +       +      0  Augmentation  +  +  +   0      +       +      0  Compressible  +  +  +   0      +       +  Competent     +  +  +   0      +       +      0  Legend:  + - yes  o - no  p - partial  D - decreased   COMPRESSIBLE BAS/CEPH LEFT:  0   IMPRESSION:  1. Acute deep venous thrombosis noted in the left subclavian vein.  2. Thrombosis noted in the left basilic vein.   ___________________________________________  P. Drucie Opitz, M.D.   MG/MEDQ  D:  12/22/2007  T:  12/22/2007  Job:  [id number]

## 2010-08-07 NOTE — Consult Note (Signed)
NAME:  [name] [name], [name] [name] NO.:  [id number]   MEDICAL RECORD NO.:  [id number]          PATIENT TYPE:  INP   LOCATION:  0102                         FACILITY:  [facility]   PHYSICIAN:  Mark C. Karsten Ro, M.D.  DATE OF BIRTH:  [birth year]   DATE OF CONSULTATION:  08/30/2007  DATE OF DISCHARGE:                                 CONSULTATION   HISTORY OF PRESENT ILLNESS:  Mr. Dice is a very pleasant 77-year-  old white male patient of Dr. Jeffie Pollock seen in the hospital consultation  for further evaluation of an elevated creatinine and mild bilateral  hydronephrosis seen on ultrasound.  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  He was last  seen by Dr. Jeffie Pollock on Jul 28, 2007.  At that time, was doing well with no  problem with no specific complaints.  He had had recent blood work done  in [place] that revealed normal creatinine at that time.  He also has  history of renal cyst seen on CT scan.   He is seen at this time having come to the emergency room with elevated  temperature and not feeling well.  He reports he had an episode of  bleeding pain in his left flank region yesterday that he took Tylenol  for a and it resolved.  He has no history of kidney stones and a CT scan  done in October 2008 revealed no evidence of renal calculi at that time.  He was currently seen without flank pain or complaint.  He has no  voiding symptoms since he has no bladder, but he does have an ileal  conduit, and he has been managing this without difficulty.  He had a  creatinine done when he presented to the emergency room and it was found  to be markedly elevated to 7.02.  A renal ultrasound was performed which  revealed bilateral hydronephrosis and bilateral renal calculi, and I was  consulted.  He has no modifying factors.  This is located in both  kidneys.  The severity is mild and  was just discovered on ultrasound  today.   PAST MEDICAL HISTORY:  1. History of bladder cancer.  2. Gout.  3. Hypertension.  4. History of depression.  5. Osteoporosis.   SURGICAL HISTORY:  1. Appendectomy.  2. Radical cystectomy.  3. Ileal conduit.   CURRENT MEDICATIONS:  Zosyn, Ambien, Lexapro, Zofran, Protonix.   ALLERGIES:  NKDA.   FAMILY HISTORY:  Positive for family history of pancreatic disorder.  Otherwise, there is no family history of GU malignancy or renal disease.   SOCIAL HISTORY:  He does report caffeine use, but no alcohol use and has  smoked and chewed tobacco but quit 50 years ago.   REVIEW OF SYSTEMS:  Positive for fever and chills as well as weakness  and as noted above, otherwise, his entire review of systems his  negative.   PHYSICAL EXAMINATION:  VITAL SIGNS:  Temperature is 98.6, pulse 93,  respiration 18, blood pressure is 147/63.  HEENT:  Atraumatic, normocephalic.  Oropharynx clear.  NECK:  Supple with midline trachea.  CHEST:  Reveals normal respiratory effort.  CARDIOVASCULAR:  Regular rate and rhythm.  ABDOMEN:  Soft and nontender without mass or HSM.  He has a stoma in his  right lower quadrant and a healed scar in the midline of the lower  abdomen.  BACK:  Reveals no CVAT to percussion on either side.  GU:  Reveals normal male genitalia with a normal phallus that is  uncircumcised and reveals phimosis but no discharge or lesion.  Testicles were descended bilaterally without mass or lesion.  Epididymis  were normal bilaterally.  RECTAL:  Reveals normal sphincter tone.  No prostate is palpable.  No  masses were noted.   LABORATORY RESULTS:  White count is 21.0, hemoglobin 11.1, hematocrit  32.5, platelets 305,000, creatinine is 7.02, BUN 94 and potassium 5.0.  His urinalysis had 0-2 red blood cells and 3-6 white blood cells with  many bacteria (urine obtained from his stomal bag) the urine was nitrite  negative.   Renal ultrasound  reveals bilateral mild hydronephrosis with  nonobstructive bilateral renal calculi and a right renal cyst.   IMPRESSION:  1. History of transitional cell carcinoma of the bladder status post      radical cystoprostatectomy and ileal conduit with nothing to      suggest recurrence at this time.  2.Markedly elevated creatinine over the past month.  This appears to be  in part prerenal with a markedly elevated BUN.  His creatinine, however,  was normal one month ago.  He does have bilateral mild hydronephrosis  that could be seen without obstruction in a patient with an ileal  conduit as a chronic finding.  With bilateral renal calculi, there is  however a very remote possibility of bilateral distal ureteral calculi  in a patient who is dehydrated and not demonstrating significant  hydronephrosis.  1. Elevated white count.  His urine from his bag looks remarkably good      and culture will always be positive if it is performed on urine      obtained from his conduit collection device.   RECOMMENDATIONS:  1. I would not recommend percutaneous nephrostomy tube placement at      this time with only mild hydronephrosis being seen.  With the      conduit, stents cannot be placed in a retrograde fashion      cystoscopically, and therefore percutaneous nephrostomy tubes would      need to be placed if there was documented evidence of obstruction.      I would hold off on that for right now.  2. Hydrate overnight and recheck his creatinine in the morning.  3. I need to check his ureters for the presence of stones with a      noncontrasted CT scan of the abdomen and pelvis.  4. Broad-spectrum antibiotics as you are doing and evaluate for source      of elevated white count other than the urinary tract for      completeness sake.      Mark C. Karsten Ro, M.D.  Electronically Signed     MCO/MEDQ  D:  08/29/2007  T:  08/30/2007  Job:  [id number]   cc:   Elta Guadeloupe A. Perini, M.D.  Fax: [phone]

## 2010-08-09 ENCOUNTER — Ambulatory Visit (INDEPENDENT_AMBULATORY_CARE_PROVIDER_SITE_OTHER): Payer: Medicare Other | Admitting: *Deleted

## 2010-08-09 DIAGNOSIS — I498 Other specified cardiac arrhythmias: Secondary | ICD-10-CM

## 2010-08-10 NOTE — Consult Note (Signed)
NAME:  [name] [name]            ACCOUNT NO.:  [id number]   MEDICAL RECORD NO.:  [id number]          PATIENT TYPE:  OIB   LOCATION:  1410                         FACILITY:  [facility]   PHYSICIAN:  Mark A. Perini, M.D.   DATE OF BIRTH:  [birth year]   DATE OF CONSULTATION:  12/26/2004  DATE OF DISCHARGE:                                   CONSULTATION   We have been asked to see this patient for gout attack.   HISTORY OF PRESENT ILLNESS:  [name] [name] is an 77 year old male who is  postoperative day #1 status post transurethral resection of a bladder tumor.  He has gout in his ankles and feet.  There is a desire to avoid antiplatelet  therapy for the next 2 weeks.  We were asked to see him for this.   PAST MEDICAL HISTORY:  1.  Appendectomy.  2.  Hypertension.  3.  Gout.  4.  Decreased hearing.  5.  Chronic renal insufficiency.  6.  Moderate aortic stenosis and left ventricular hypertrophy.  7.  Hyperlipidemia.   ALLERGIES:  ASPIRIN upsets his stomach but he has been able to tolerate  three-times-a-week aspirin recently.   MEDICATIONS:  1.  Lisinopril/HCTZ 20/12.5 1 daily.  2.  Fiber daily.  3.  Aspirin three times a week.  4.  __________ daily.  5.  Indomethacin as needed.  6.  Zocor 40 mg each evening.  7.  In the hospital, he is not on indomethacin or aspirin but he is on Cipro      250 mg b.i.d. and as-needed Percocet in addition to his other home      medicines.   SOCIAL HISTORY:  He has been married since 1946.  His wife's name is Apolonio Schneiders.  He is retired.  He had a remote tobacco use history but none currently.   FAMILY HISTORY:  Mother died at age 2 of pancreatic cancer.  Father died at  age 85 of pancreatic cancer.   REVIEW OF SYSTEMS:  The patient is eating well.  He was going to possibly be  discharged home today but he was unable to pass urine; so his Foley has been  replaced.  He is in no significant pain except for in his ankle and feet,  where his gout is  present.   PHYSICAL EXAMINATION:  VITAL SIGNS:  Maximum temperature 101.1, current  temperature 99.1, pulse 66, respiratory rate 18, blood pressure 131/69, 96%  saturation on 2 L of oxygen.  GENERAL:  He is in no acute distress.  He is alert and oriented x4.  LUNGS:  Clear to auscultation bilaterally.  HEART:  Regular rate and rhythm with a 2-3/6 murmur at the right sternal  border in systole.  ABDOMEN:  Soft and nontender.  EXTREMITIES:  He does have some warmth and redness of his right first MTP  joint.  This is tender.  He also has some swelling of his right ankle joint,  which is also somewhat tender.  His left ankle joint is slightly tender as  well.   LABORATORY DATA:  No new labs noted.   ASSESSMENT  AND PLAN:  1.  Gout of right ankle, right first metatarsophalangeal joint, and possibly      the left ankle as well.  We will treat him with b.i.d. colchicine 0.6      mg.  We will give a one-time dose of 50 mg of prednisone.  We will      follow with you.  He should continue his colchicine as an outpatient.  2.  Aortic stenosis.  No signs of CHF currently.  We will saline lock his      IV, however.  3.  Bladder tumor, status post resection.  4.  Hypertension, good control currently.  We may need to consider stopping      his hydrochlorothiazide in the future; however, we will leave this on      board for now.   Thank you for the consultation and for the urologic care.           ______________________________  Jeannette How. Joylene Draft, M.D.     MAP/MEDQ  D:  12/26/2004  T:  12/26/2004  Job:  [id number]

## 2010-08-10 NOTE — Discharge Summary (Signed)
NAME:  EUAL, GUIDICE NO.:  [id number]   MEDICAL RECORD NO.:  [id number]          PATIENT TYPE:  INP   LOCATION:  1615                         FACILITY:  [facility]   PHYSICIAN:  Marshall Cork. Jeffie Pollock, M.D.    DATE OF BIRTH:  March 21, 1922   DATE OF ADMISSION:  01/30/2005  DATE OF DISCHARGE:  02/11/2005                                 DISCHARGE SUMMARY   DISCHARGE DIAGNOSIS:  Invasive carcinoma.   PROCEDURE:  Cystoprostatectomy with creation of ileal conduit and bilateral  pelvic lymph node dissection.  Surgeon Diona Foley, M.D.   CONSULTATIONS:  Internal medicine.   HOSPITAL COURSE:  The patient was admitted to [facility] and taken to the  operating room on January 30, 2005, where he underwent the above-named  procedure which he tolerated without complication.  He was taken to the PACU  and then to the ICU in stable condition where he remained for his first  three postoperative days and then transferred to the floor for the remainder  of his hospital stay.  His hospital stay was complicated by a prolonged  ileus that required prolonged nasogastric tube drainage.  Multiple acute  abdominal series were checked which showed findings consistent with an ileus  and not a small bowel obstruction.  This also necessitated the placement of  a PICC line and the administration of TPN to supplement his nutrition.  With  time, the patient did regain normal bowel function.  His nasogastric tube  was given trials of clamping and eventually removed which he tolerated.  When he began to tolerate normal diet and have normal bowel function,  his  TPN was discontinued.  His hospital course otherwise was uncomplicated  consistent with progressive toleration of pain and ambulation.  On February 11, 2005, it was determined the patient was in stable condition to be  discharged home.   DISCHARGE PHYSICAL EXAMINATION:  VITAL SIGNS:  Afebrile with stable vital  signs.  GENERAL:  This is an  77 year old gentleman in no acute distress.  HEENT:  Atraumatic, normocephalic, pupils equal round and reactive to light  and accommodation.  Extraocular movements intact. Mucous membranes are moist  and intact.  NECK:  Supple with no cervical lymphadenopathy.  No JVD.  CARDIAC:  Regular rate and rhythm without murmurs, rubs or gallops.  LUNGS:  Clear to auscultation.  ABDOMEN:  Nondistended, nontender, soft to palpation.  Incision is clean,  dry and intact.  Steri-Strips are in place.  There is no surrounding  erythema or exudate.  Ileostomy is pink, patent and productive with clear  urine.  Stents are seen exiting the stoma.   DISPOSITION:  To home.   WOUND CARE:  The patient is given routine wound care instructions.   SPECIAL INSTRUCTIONS:  The patient is instructed to call or return if he  experiences any fevers, chills, nausea, vomiting, redness or drainage from  his wound.  He is instructed to call if he has any decreased urine output  from his ileostomy.   ACTIVITY:  He is instructed not to drive while taking narcotics and not to  lift more  than 10 pounds for the next 6 weeks.  He understands and agrees  with the instructions.   DISCHARGE MEDICATIONS:  1.  Vicodin.  2.  Ferrous sulfate 325 mg p.o. b.i.d.  3.  Colace 100 mg p.o. b.i.d.  4.  Lisinopril 20 mg b.i.d.  5.  Lopressor 12.5 mg p.o. b.i.d.  6.  Colchicine.  7.  Allopurinol.  8.  Protonix.     ______________________________  Guillermina City, MD      Marshall Cork. Jeffie Pollock, M.D.  Electronically Signed    MT/MEDQ  D:  02/11/2005  T:  02/11/2005  Job:  [id number]

## 2010-08-10 NOTE — Op Note (Signed)
NAME:  [name] [name], [name] [name] NO.:  [id number]   MEDICAL RECORD NO.:  [id number]          PATIENT TYPE:  AMB   LOCATION:  DAY                          FACILITY:  [facility]   PHYSICIAN:  Marshall Cork. Jeffie Pollock, M.D.    DATE OF BIRTH:  10-02-1921   DATE OF PROCEDURE:  12/25/2004  DATE OF DISCHARGE:                                 OPERATIVE REPORT   PROCEDURE:  Cystoscopy, bilateral retrograde pyelograms with interpretation  and transurethral resection of a large bladder tumor from the right lateral  wall and dome.   PREOPERATIVE DIAGNOSIS:  Right bladder tumor.   POSTOPERATIVE DIAGNOSIS:  Right bladder tumor.   SURGEON:  Marshall Cork. Jeffie Pollock, M.D.   ANESTHESIA:  General.   SPECIMEN:  Bladder tumor and base of tumor.   DRAINS:  22-French Foley catheter.   COMPLICATIONS:  None.   INDICATIONS:  Mr. Garvis is an 77 year old white male who was found on  evaluation for hematuria to have a large bladder tumor on the right lateral  wall of the bladder.  He has mild renal insufficiency so it was felt that  system retrogrades and TURBT was indicated.   DESCRIPTION OF PROCEDURE:  The patient was given p.o. Cipro and was taken to  the operating room where general anesthetic was induced. He was placed in  lithotomy position.  His perineum and genitalia were prepped with Betadine  solution.  He was draped in the usual sterile fashion.   Cystoscopy was performed usual fashion with a 22-French scope and 12 and 70  degree lenses.  Examination revealed a normal urethra.  The external  sphincter was intact.  The prostatic urethra had bilobar hyperplasia with  moderate obstruction.  Examination of the bladder revealed mild  trabeculation.  The ureteral orifices were unremarkable.  On the right  lateral wall of the bladder near the dome was a large bladder tumor which  was papillary in appearance with some dystrophic changes more proximally  toward the bladder neck.  It appeared to measure 5+  centimeters incised with  some satellite tumors around the base.  No other tumors were noted on other  areas of the bladder wall.   Retrograde pyelography was performed bilaterally with a 5-French opening  catheter and contrast the studies revealed normal ureters and internal  collecting systems on both sides.  No films were taken.   The urethra was then calibrated to 30-French with male sounds, and a 41-  [place] continuous flow resectoscope sheath was inserted.  This was fitted  with an [place] handle, 12 degree lens and appropriate cutting loop.  The  tumor was then resected.  Once once the bulk of the tumor had been removed  some additional papillary fronds were noted around the base; these were  resected.  Once the entire tumor bulk had been resected, the bladder was  evacuated free of chips.  Some tissue was then resected from the base of the  tumor to help determine the extent of invasion.   At this point, hemostasis was secured.  Inspection of the area revealed an  area of resection in excess  of 5 cm with no active bleeding and no retained  tissue.   At this point, the resectoscope was removed.  A 22-French Foley catheter was  inserted.  The balloon was filled with 10 cc of sterile fluid.  The catheter  was irrigated with an [place] syringe, and the irrigant returned clear.  The  catheter was placed to straight drainage.   The patient was taken down from lithotomy position.  His anesthetic was  reversed easily.  He went to the recovery room in stable condition.  There  were no complications.      Marshall Cork. Jeffie Pollock, M.D.  Electronically Signed     JJW/MEDQ  D:  12/25/2004  T:  12/25/2004  Job:  [id number]

## 2010-08-10 NOTE — Op Note (Signed)
NAME:  [name] [name], [name] [name] NO.:  [id number]   MEDICAL RECORD NO.:  [id number]          PATIENT TYPE:  INP   LOCATION:  [id number]                         FACILITY:  [facility]   PHYSICIAN:  Marshall Cork. Jeffie Pollock, M.D.    DATE OF BIRTH:  10-12-1921   DATE OF PROCEDURE:  01/30/2005  DATE OF DISCHARGE:                                 OPERATIVE REPORT   PREOPERATIVE DIAGNOSIS:  Invasive transitional cell carcinoma of bladder.   POSTOPERATIVE DIAGNOSIS:  Invasive transitional cell carcinoma of bladder.   PROCEDURE:  Radical cystoprostatectomy with creation of ileal conduit and  bilateral pelvic lymph node dissection.   SURGEON:  Irine Seal, M.D.   ASSISTANT:  Guillermina City, M.D.   ANESTHESIA:  General endotracheal.   SPECIMENS:  1.  Bilateral distal ureteral margins.  2.  Bilateral obturator pelvic lymph node packets.  3.  Bladder, prostate and urethra.   PROCEDURE:  The patient was identified by his wrist bracelet and brought to  Room 10 where he received preprocedure antibiotics. He was prepped and  draped in usual sterile fashion, taking care to minimize compartment  syndrome and peripheral neuropathy. Next, we inserted an 18-French catheter  into the level of urinary bladder. Clear output was obtained, and bladder  was drained. Foley catheter was capped. Next, we made a low midline incision  from his pubic symphysis to 2 cm inferior to his umbilicus. We carried down  through the dermis, subcutaneous fat, Scarpa fascia with Bovie cautery until  the anterior fascial sheath was identified. We identified the midline by the  decussating fibers and divided this with Bovie cautery. Next, we undermined  the anterior sheath with the surgeon's first finger and opened the remainder  of the fascia on surgeon's first finger with the Bovie cautery. Next, the  rectus abdominis bellies were identified and bluntly opened along their  midline. Next, we identified the posterior sheath which was  grasped with  opposing pickups, divided sharply. We then visualized the preperitoneal fat  which was again lifted up with opposing forceps and divided. We then  visualized loops of small bowel. Because of the patient's appendectomy, we  were careful to palpate the peritoneal cavity. There were no adhesions  underlying the length of our incision, so we opened the remainder of the  posterior sheath with Bovie cautery along the length of the incision. Next,  our retractors were set. We did identify a small area of adhesion in the  right lower quadrant. There were adhesions between loops of bowel. These  were taken down sharply. The loops of bowel were examined and found to be  viable. We were able to easily identify the ileocecal junction. Next, we  identified the obliterated median umbilical ligament. There were grasped  with Kelly clamps, and we then dissected down from this level at the  umbilicus to the lateral pelvic sidewall on each side to create an inverted  V-shaped flap. This was done with Bovie cautery. At the level of the pelvic  sidewall, we were also at the level of the dome of bladder. We then scored  the posterior peritoneal  lining with Bovie cautery in a U-type shape just  proximal to the posterior aspect of the bladder. We did palpate the pelvic  brim in the overlying iliac vessels. Just medial to this, with a combination  of blunt and sharp dissection, we localized each ureter. Each ureter was  isolated with a vessel loop and with a combination of blunt and sharp  dissection, we dissected out the ureter in both proximal and distal  direction to the level of the bladder. Next, each ureter was divided just  proximal to the level of the insertion of the bladder with Weck clips and  divided sharply. Next, we sharply removed distal ureteral segment,  passed  it off the field for pathologic analysis. Both of these pathologic samples  returned negative for transitional cell  carcinoma or CIS. Following this, we  bluntly developed a plane starting at our U-shaped scored incision between  the bladder and the rectum. This was done with the surgeon's hand. We were  able to bluntly dissect to the level of prostate. Next, we addressed each  pedicle of the bladder. We dissected down through these pedicles with the  LigaSure device at the level of the vas deferens. The vas was also divided  with the LigaSure device. We continued down on each side into the lateral  prostatic fascia. Following this, we divided the puboprostatic fascia  sharply from the apex of the prostate to its base. We then isolated the  puboprostatic ligaments with Kidner sponge stick and divided them sharply.  Next, we palpated groove between the dorsal venous complex and the anterior  urethra with the surgeon's first finger. We then passed a Hohenfellner clamp  between the dorsal venous complex and the anterior urethra and brought a 0  Vicryl through on a pass which was secured. This was repeated a second time.  We then divided proximal to these sutures, dividing the dorsal venous  complex. There was a slight amount of venous ooze in the dorsal venous  complex. This was over sewed with a figure-of-eight 2-0 Vicryl. Following  this, we retracted the bladder and prostate cranial direction, exposing the  anterior urethra. The anterior two-thirds was divided with Bovie cautery  exposing the Foley catheter. The valve stem was divided, and it was  delivered into the field. It was then clamped with Kelly clamp. A  Hohenfellner retractor was then passed posterior to the urethra, and the  remaining posterior one-third was divided with Bovie cautery. This left the  attachments at the lateral prostatic fascia which were divided and ligated  with the LigaSure device and Metzenbaum scissors. At this point, the  specimen was completely freed from the pelvis. We passed it off the field for pathologic analysis. We  then inspected the entire pelvis. There was a  small amount of venous oozing just inferior to the urethra. This was over  sewn with interrupted figure-of-eight sutures using 2-0 Vicryl. The rectum  was inspected. It was noted to be without injury. Next, we inserted a second  18-French Foley catheter into the anterior urethra. Thirty cc of sterile  water were inserted into the valve stem, and a small amount of traction was  provided to aid in hemostasis at the dorsal venous complex. Next, we turned  attention to the pelvic sidewalls. We identified the obturator fossa and the  superior lying external iliac artery and vein. Using the split and roll  technique, we divided the tissue over the external iliac artery and  dissected  down, rolling the tissue as we dissected inferiorly. Once over the  external iliac vein, we dissected bluntly laterally to the pelvic sidewall.  We then bluntly dissected out the obturator nerve which was kept in plain  site throughout her dissection. The lymph tissue in the fossa was ligated  and divided with Weck clips and Metzenbaum scissors respectively. Nodes  structures were passed off the field for pathologic analysis. This process  was then repeated in the right obturator fossa using the aforementioned  technique again. The obturator nerve was kept in plain site during the  entire dissection, ensuring that it was not injured. Next, we reset our  retractors as to allow the small bowel to fall into the anatomic pelvis. We  identified the ileocecal junction and followed the ileum back proximally 10  to 15 cm. We identified a suitable 10-cm segment with arcade that would  provide excellent blood supply through a conduit. We placed a 3-0 silk pop-  off on the distal aspect of our proposed resection site. Next, we scored a  small hole in the mesentery with Bovie cautery distal to the arcade that  would supply the ileal conduit. We also dissected sharply the mesentery  off  the small bowel at the level of small bowel. These two defects were  connected, and the mesentery was ligated and divided with LigaSure device  and Metzenbaum scissors. This process was repeated at the level of the  mesentery at the proximal aspect of our conduit, again making sure our  mesentery division was proximal to our vascular arcade. Next, we divided the  small bowel at each site with a GIA stapling device. Next, we placed the  ileal conduit inferior to our bowel anastomosis. We removed the  antimesenteric corners with Mayo scissors on each end of our anastomosis. We  placed a holding suture at the base of our anastomosis and then slid each  half of the third GIA stapling device down the antimesenteric side. This was  fired across the opposing antimesenteric sides. The GIA was removed. We  inspected the interior of our anastomosis which was found to be hemostatic.  We placed three Allis clamps across the open end of our anastomosis. GIA stapling device was fired across it. The excess mucosa was removed. We then  over sewed our anastomosis with interrupted 3-0 silks. The mesentery traps  were closed with interrupted 3-0 silks, taking care not to interrupt the  mesenteric blood supply. Following this, we turned our attention back to our  free ureters. We noted we had excellent length on both ureters. We created a  subsigmoid tunnel for the left ureter which was brought through the tunnel  on a Hohenfellner pass. Next, we created small ileostomies at the proposed  sites the ureters would be reimplanted with Metzenbaum scissors. We then  divided the ureters distally to remove the Weck clips. Each ureter was then  spatulated. We started with our left ureter and placed our holding stitch in  an inside-out fashion at the apex of the spatulation. This was then brought  in an inside-out fashion to the 6 o'clock position in our ileostomy. We then  completed the back wall of the  anastomosis with interrupted 4-0 chromic.  Next, we divided the distal aspect over our ileal conduit sharply. The  conduit was copiously irrigated. Next, we placed a single J stent down  through the distal aspect of our conduit out our ileostomy, up the left  ureter. Next, a wire  was removed. We secured the single J ureteral stent to  the level of the conduit with interrupted 4-0 chromic suture. Next, finally  we completed the anterior wall of the ureteral ileal anastomosis with  interrupted 4-0 chromic. Next, this process was repeated on the right  ureter. The Weck clip was removed sharply. The ureter was spatulated.  Holding stitch at the apex of the spatulation was placed with interrupted 4-  0 chromic to the 6 o'clock position in the ileostomy. The posterior wall of  the ileal ureteral anastomosis was completed with interrupted 4-0 chromic.  Next, we placed a second stent down through the distal aspect of the ileal  conduit out the ileostomy into the ureter, up to the level of the right  kidney. The wire was removed. It was secured. The stent was secured to the  ileal conduit with interrupted 4-0 chromic, and the anterior wall of the  anastomosis was completed with interrupted 4-0 chromic sutures. Next, we  reinspected our bowel anastomosis. The tissue was noted be pink and very  viable looking. We turned attention back to our ileal conduit. The distal  proximally 1 cm was noted to be somewhat dusky in nature. Next, we turned  attention to the anterior abdominal wall at our spot that had been marked by  the stoma service. We removed a circular piece of tissue approximately 1 cm  in diameter. We dissected down to the anterior sheath. Cruciate incision was  made with Bovie cautery. We then dissected through the rectus muscle  bluntly. Surgeon's two fingers were placed through the fascial wall and to  ensure adequate patency. Next, the conduit was brought through the anterior  abdominal  wall. We noted the continued duskiness in the distal 1 cm of it. Because of this, the distal 1 cm was removed sharply back to a level of well  perfused-looking tissue. Next, we secured our conduit to the level of the  fascia with interrupted 3-0 Vicryls between connecting the fascia to the mid  conduit to this serosa, taking superficial bites of the serosa. Next, we  matured the conduit circumferentially by placing interrupted 4-0 chromics  between the level of the skin and the distal conduit serosa and the mucosa  provided an adequate rosebud appearance. The stents were trimmed to length.  Excellent urine output was noted. Next, we turned attention back to the  abdomen. All packing was removed. Sponge count and instrument counts were  noted to be correct. The surgical beds were noted be hemostatic. Next, a  0.25-cm transverse incision was placed in left lower quadrant. We pierced  through the fascia under direct visualization with a Vanderbilt clamp. Next,  a Foley fluted Blake drain was brought through on a pass. It was trimmed to  length and placed in the anatomic pelvis. We then secured to the level of  skin with a 4-0 Ethilon suture. It was attached to a drainage bulb. Next, we  closed the incision with a running #1 running PDS. Skin staples were then  placed. Next, dressing as well as ostomy bags were placed. The patient was  reversed from his anesthesia which he tolerated without complication.   Dr. Irine Seal was present and participated in all aspects of this case.     ______________________________  Guillermina City, MD      Marshall Cork. Jeffie Pollock, M.D.  Electronically Signed    MT/MEDQ  D:  01/31/2005  T:  01/31/2005  Job:  [id number]

## 2010-10-01 ENCOUNTER — Encounter: Payer: Self-pay | Admitting: Internal Medicine

## 2010-10-02 ENCOUNTER — Ambulatory Visit (INDEPENDENT_AMBULATORY_CARE_PROVIDER_SITE_OTHER): Payer: Medicare Other | Admitting: Internal Medicine

## 2010-10-02 ENCOUNTER — Encounter: Payer: Self-pay | Admitting: Internal Medicine

## 2010-10-02 DIAGNOSIS — I498 Other specified cardiac arrhythmias: Secondary | ICD-10-CM

## 2010-10-02 DIAGNOSIS — Z950 Presence of cardiac pacemaker: Secondary | ICD-10-CM | POA: Insufficient documentation

## 2010-10-02 DIAGNOSIS — R001 Bradycardia, unspecified: Secondary | ICD-10-CM

## 2010-10-02 DIAGNOSIS — I10 Essential (primary) hypertension: Secondary | ICD-10-CM | POA: Insufficient documentation

## 2010-10-02 LAB — PACEMAKER DEVICE OBSERVATION
AL IMPEDENCE PM: 507 Ohm
AL THRESHOLD: 0.5 V
RV LEAD IMPEDENCE PM: 698 Ohm
RV LEAD THRESHOLD: 0.5 V

## 2010-10-02 NOTE — Assessment & Plan Note (Signed)
His device is working normally. We'll recheck in several months.

## 2010-10-02 NOTE — Assessment & Plan Note (Signed)
His blood pressure is slightly elevated today. I've asked that he maintain a low-sodium diet.

## 2010-10-02 NOTE — Progress Notes (Signed)
HPI Mr. Rodarte returns today for followup. He is a pleasant 77 yo man with a h/o symptomatic bradycardia s/p PPM. He has has gout and borderline HTN. He denies c/p, sob, or syncope. He admits to some peripheral edema.  No Known Allergies   Current Outpatient Prescriptions  Medication Sig Dispense Refill  . acetaminophen (TYLENOL) 325 MG tablet Take 650 mg by mouth every 6 (six) hours as needed.        Marland Kitchen aspirin 81 MG tablet Take 81 mg by mouth daily.        . Cholecalciferol (VITAMIN D-3 PO) Take by mouth. 1000 iu  daily       . citalopram (CELEXA) 20 MG tablet Take 20 mg by mouth daily.        . colchicine 0.6 MG tablet Take 0.6 mg by mouth as needed.        . Multiple Vitamin (MULTIVITAMIN) tablet Take 1 tablet by mouth daily.        . Omega-3 Fatty Acids (FISH OIL) 1000 MG CAPS Take 1,000 mg by mouth daily.        Marland Kitchen ULORIC 40 MG tablet          Past Medical History  Diagnosis Date  . Hypertension   . Aortic valve stenosis   . Gout   . Hx of bladder cancer   . Deep venous thrombosis     hx of  . Prostate cancer   . Osteoporosis   . Renal failure   . Heart murmur   . Depression   . Chronic kidney disease     ROS:   All systems reviewed and negative except as noted in the HPI.   Past Surgical History  Procedure Date  . Insert / replace / remove pacemaker   . Cystectomy   . Lymphadenectomy   . Prostate surgery   . Appendectomy   . Cataract extraction, bilateral      Family History  Problem Relation Age of Onset  . Prostate cancer Father 71    died  . Pancreatic cancer Mother 42    died     History   Social History  . Marital Status: Married    Spouse Name: N/A    Number of Children: 3  . Years of Education: N/A   Occupational History  .     Social History Main Topics  . Smoking status: Former Smoker    Types: Cigarettes    Quit date: 03/25/1952  . Smokeless tobacco: Not on file  . Alcohol Use: No  . Drug Use: No  . Sexually Active: Not  on file   Other Topics Concern  . Not on file   Social History Narrative  . No narrative on file     BP 152/95  Pulse 85  Ht 5\' 8"  (1.727 m)  Wt 163 lb (73.936 kg)  BMI 24.78 kg/m2  Physical Exam:  Well appearing elderly man, NAD HEENT: Unremarkable Neck:  No JVD, no thyromegally Lymphatics:  No adenopathy Back:  No CVA tenderness Lungs:  Clear. Well-healed pacemaker incision. HEART:  Regular rate rhythm, no murmurs, no rubs, no clicks Abd:  soft, positive bowel sounds, no organomegally, no rebound, no guarding Ext:  2 plus pulses, no edema, no cyanosis, no clubbing Skin:  No rashes no nodules Neuro:  CN II through XII intact, motor grossly intact  DEVICE  Normal device function.  See PaceArt for details.   Assess/Plan:

## 2010-10-02 NOTE — Patient Instructions (Signed)
Your physician wants you to follow-up in: 12 months with Dr Knox Saliva will receive a reminder letter in the mail two months in advance. If you don't receive a letter, please call our office to schedule the follow-up appointment.  Remote monitoring is used to monitor your Pacemaker of ICD from home. This monitoring reduces the number of office visits required to check your device to one time per year. It allows [place] to keep an eye on the functioning of your device to ensure it is working properly. You are scheduled for a device check from home on 01/03/2011. You may send your transmission at any time that day. If you have a wireless device, the transmission will be sent automatically. After your physician reviews your transmission, you will receive a postcard with your next transmission date.

## 2010-12-20 LAB — DIFFERENTIAL
Basophils Absolute: 0
Basophils Absolute: 0
Basophils Absolute: 0
Basophils Absolute: 0
Basophils Relative: 0
Basophils Relative: 0
Basophils Relative: 0
Basophils Relative: 0
Eosinophils Absolute: 0
Eosinophils Absolute: 0.4
Eosinophils Absolute: 0.4
Eosinophils Relative: 0
Eosinophils Relative: 3
Eosinophils Relative: 3
Eosinophils Relative: 5
Eosinophils Relative: 6 — ABNORMAL HIGH
Lymphocytes Relative: 10 — ABNORMAL LOW
Lymphocytes Relative: 16
Lymphocytes Relative: 19
Lymphocytes Relative: 20
Lymphs Abs: 0.2 — ABNORMAL LOW
Monocytes Absolute: 0.4
Monocytes Absolute: 0.9
Monocytes Absolute: 0.9
Monocytes Absolute: 0.9
Monocytes Absolute: 1
Monocytes Absolute: 1
Monocytes Relative: 2 — ABNORMAL LOW
Monocytes Relative: 7
Monocytes Relative: 7
Monocytes Relative: 8
Neutro Abs: 10.7 — ABNORMAL HIGH
Neutro Abs: 20.4 — ABNORMAL HIGH
Neutro Abs: 7.8 — ABNORMAL HIGH
Neutro Abs: 8.4 — ABNORMAL HIGH
Neutro Abs: 8.6 — ABNORMAL HIGH
Neutrophils Relative %: 79 — ABNORMAL HIGH
WBC Morphology: INCREASED

## 2010-12-20 LAB — COMPREHENSIVE METABOLIC PANEL
ALT: 27
AST: 19
AST: 29
Albumin: 2.1 — ABNORMAL LOW
Albumin: 2.3 — ABNORMAL LOW
Albumin: 2.3 — ABNORMAL LOW
Alkaline Phosphatase: 110
Alkaline Phosphatase: 173 — ABNORMAL HIGH
BUN: 31 — ABNORMAL HIGH
BUN: 35 — ABNORMAL HIGH
BUN: 85 — ABNORMAL HIGH
Calcium: 7.7 — ABNORMAL LOW
Calcium: 8.5
Chloride: 105
Chloride: 114 — ABNORMAL HIGH
Creatinine, Ser: 2.07 — ABNORMAL HIGH
Creatinine, Ser: 6.07 — ABNORMAL HIGH
GFR calc Af Amer: 11 — ABNORMAL LOW
GFR calc Af Amer: 37 — ABNORMAL LOW
GFR calc non Af Amer: 9 — ABNORMAL LOW
Glucose, Bld: 92
Potassium: 3.4 — ABNORMAL LOW
Sodium: 144
Total Bilirubin: 0.5
Total Bilirubin: 0.8
Total Protein: 6.4

## 2010-12-20 LAB — CBC
HCT: 27.3 — ABNORMAL LOW
HCT: 27.9 — ABNORMAL LOW
HCT: 30.3 — ABNORMAL LOW
HCT: 30.5 — ABNORMAL LOW
HCT: 32.5 — ABNORMAL LOW
HCT: 32.7 — ABNORMAL LOW
Hemoglobin: 10.2 — ABNORMAL LOW
Hemoglobin: 11.1 — ABNORMAL LOW
Hemoglobin: 11.1 — ABNORMAL LOW
Hemoglobin: 9.2 — ABNORMAL LOW
Hemoglobin: 9.8 — ABNORMAL LOW
Hemoglobin: 9.9 — ABNORMAL LOW
MCHC: 33.4
MCHC: 33.6
MCHC: 33.7
MCHC: 33.7
MCHC: 33.9
MCHC: 34.1
MCV: 92.1
MCV: 92.7
MCV: 92.7
MCV: 92.8
MCV: 92.8
MCV: 93.1
Platelets: 256
Platelets: 283
Platelets: 305
RBC: 3.13 — ABNORMAL LOW
RBC: 3.2 — ABNORMAL LOW
RBC: 3.3 — ABNORMAL LOW
RBC: 3.54 — ABNORMAL LOW
RDW: 14.3
RDW: 14.4
RDW: 14.4
RDW: 14.6
RDW: 14.6
RDW: 14.9
WBC: 21 — ABNORMAL HIGH

## 2010-12-20 LAB — BASIC METABOLIC PANEL
BUN: 32 — ABNORMAL HIGH
BUN: 71 — ABNORMAL HIGH
BUN: 81 — ABNORMAL HIGH
BUN: 94 — ABNORMAL HIGH
CO2: 16 — ABNORMAL LOW
CO2: 18 — ABNORMAL LOW
CO2: 22
CO2: 25
CO2: 26
CO2: 26
Calcium: 6.9 — ABNORMAL LOW
Calcium: 8.5
Calcium: 8.7
Calcium: 8.8
Chloride: 108
Chloride: 109
Chloride: 114 — ABNORMAL HIGH
Chloride: 114 — ABNORMAL HIGH
Chloride: 114 — ABNORMAL HIGH
Creatinine, Ser: 2.22 — ABNORMAL HIGH
Creatinine, Ser: 4.54 — ABNORMAL HIGH
Creatinine, Ser: 5.3 — ABNORMAL HIGH
GFR calc Af Amer: 13 — ABNORMAL LOW
GFR calc Af Amer: 28 — ABNORMAL LOW
GFR calc Af Amer: 35 — ABNORMAL LOW
GFR calc Af Amer: 37 — ABNORMAL LOW
GFR calc non Af Amer: 16 — ABNORMAL LOW
GFR calc non Af Amer: 31 — ABNORMAL LOW
Glucose, Bld: 120 — ABNORMAL HIGH
Glucose, Bld: 83
Glucose, Bld: 91
Glucose, Bld: 94
Glucose, Bld: 97
Potassium: 3.8
Potassium: 4.5
Potassium: 4.5
Potassium: 4.7
Potassium: 5
Sodium: 137
Sodium: 141
Sodium: 144
Sodium: 144
Sodium: 146 — ABNORMAL HIGH

## 2010-12-20 LAB — APTT: aPTT: 41 — ABNORMAL HIGH

## 2010-12-20 LAB — HEPATIC FUNCTION PANEL
ALT: 18
ALT: 19
ALT: 36
ALT: 41
AST: 20
AST: 28
AST: 32
AST: 70 — ABNORMAL HIGH
Albumin: 2.4 — ABNORMAL LOW
Alkaline Phosphatase: 168 — ABNORMAL HIGH
Alkaline Phosphatase: 94
Bilirubin, Direct: 0.1
Bilirubin, Direct: 0.1
Bilirubin, Direct: 0.1
Bilirubin, Direct: <0.1
Indirect Bilirubin: 0.4
Indirect Bilirubin: 0.5
Total Bilirubin: 0.5
Total Bilirubin: 0.6
Total Protein: 6.5

## 2010-12-20 LAB — TRANSFERRIN: Transferrin: 129 — ABNORMAL LOW

## 2010-12-20 LAB — CULTURE, BLOOD (ROUTINE X 2)

## 2010-12-20 LAB — URINALYSIS, ROUTINE W REFLEX MICROSCOPIC
Bilirubin Urine: NEGATIVE
Glucose, UA: 100 — AB
Hgb urine dipstick: NEGATIVE
Ketones, ur: NEGATIVE
Protein, ur: 100 — AB

## 2010-12-20 LAB — FOLATE: Folate: 7.8

## 2010-12-20 LAB — LEGIONELLA ANTIGEN, URINE

## 2010-12-20 LAB — URINE CULTURE: Colony Count: >=100000

## 2010-12-20 LAB — URINE MICROSCOPIC-ADD ON

## 2010-12-20 LAB — RETICULOCYTES: Retic Count, Absolute: 28.6

## 2010-12-20 LAB — PROTIME-INR
INR: 1.3
Prothrombin Time: 16.8 — ABNORMAL HIGH

## 2010-12-20 LAB — MAGNESIUM: Magnesium: 1.5

## 2010-12-20 LAB — CORTISOL: Cortisol, Plasma: 26.2

## 2010-12-20 LAB — VANCOMYCIN, RANDOM: Vancomycin Rm: 7.9

## 2010-12-24 LAB — BASIC METABOLIC PANEL
BUN: 31 — ABNORMAL HIGH
Calcium: 8.8
GFR calc non Af Amer: 45 — ABNORMAL LOW
Glucose, Bld: 95

## 2011-01-03 ENCOUNTER — Encounter: Payer: Medicare Other | Admitting: *Deleted

## 2011-01-07 ENCOUNTER — Encounter: Payer: Self-pay | Admitting: *Deleted

## 2011-03-03 IMAGING — CR DG CHEST 2V
2 series · 2 of 2 positions shown · non-contrast
Comparison: 04/04/2008

CLINICAL DATA: The bladder cancer - routine follow-up

CHEST - 2 VIEW

[w chest pa]
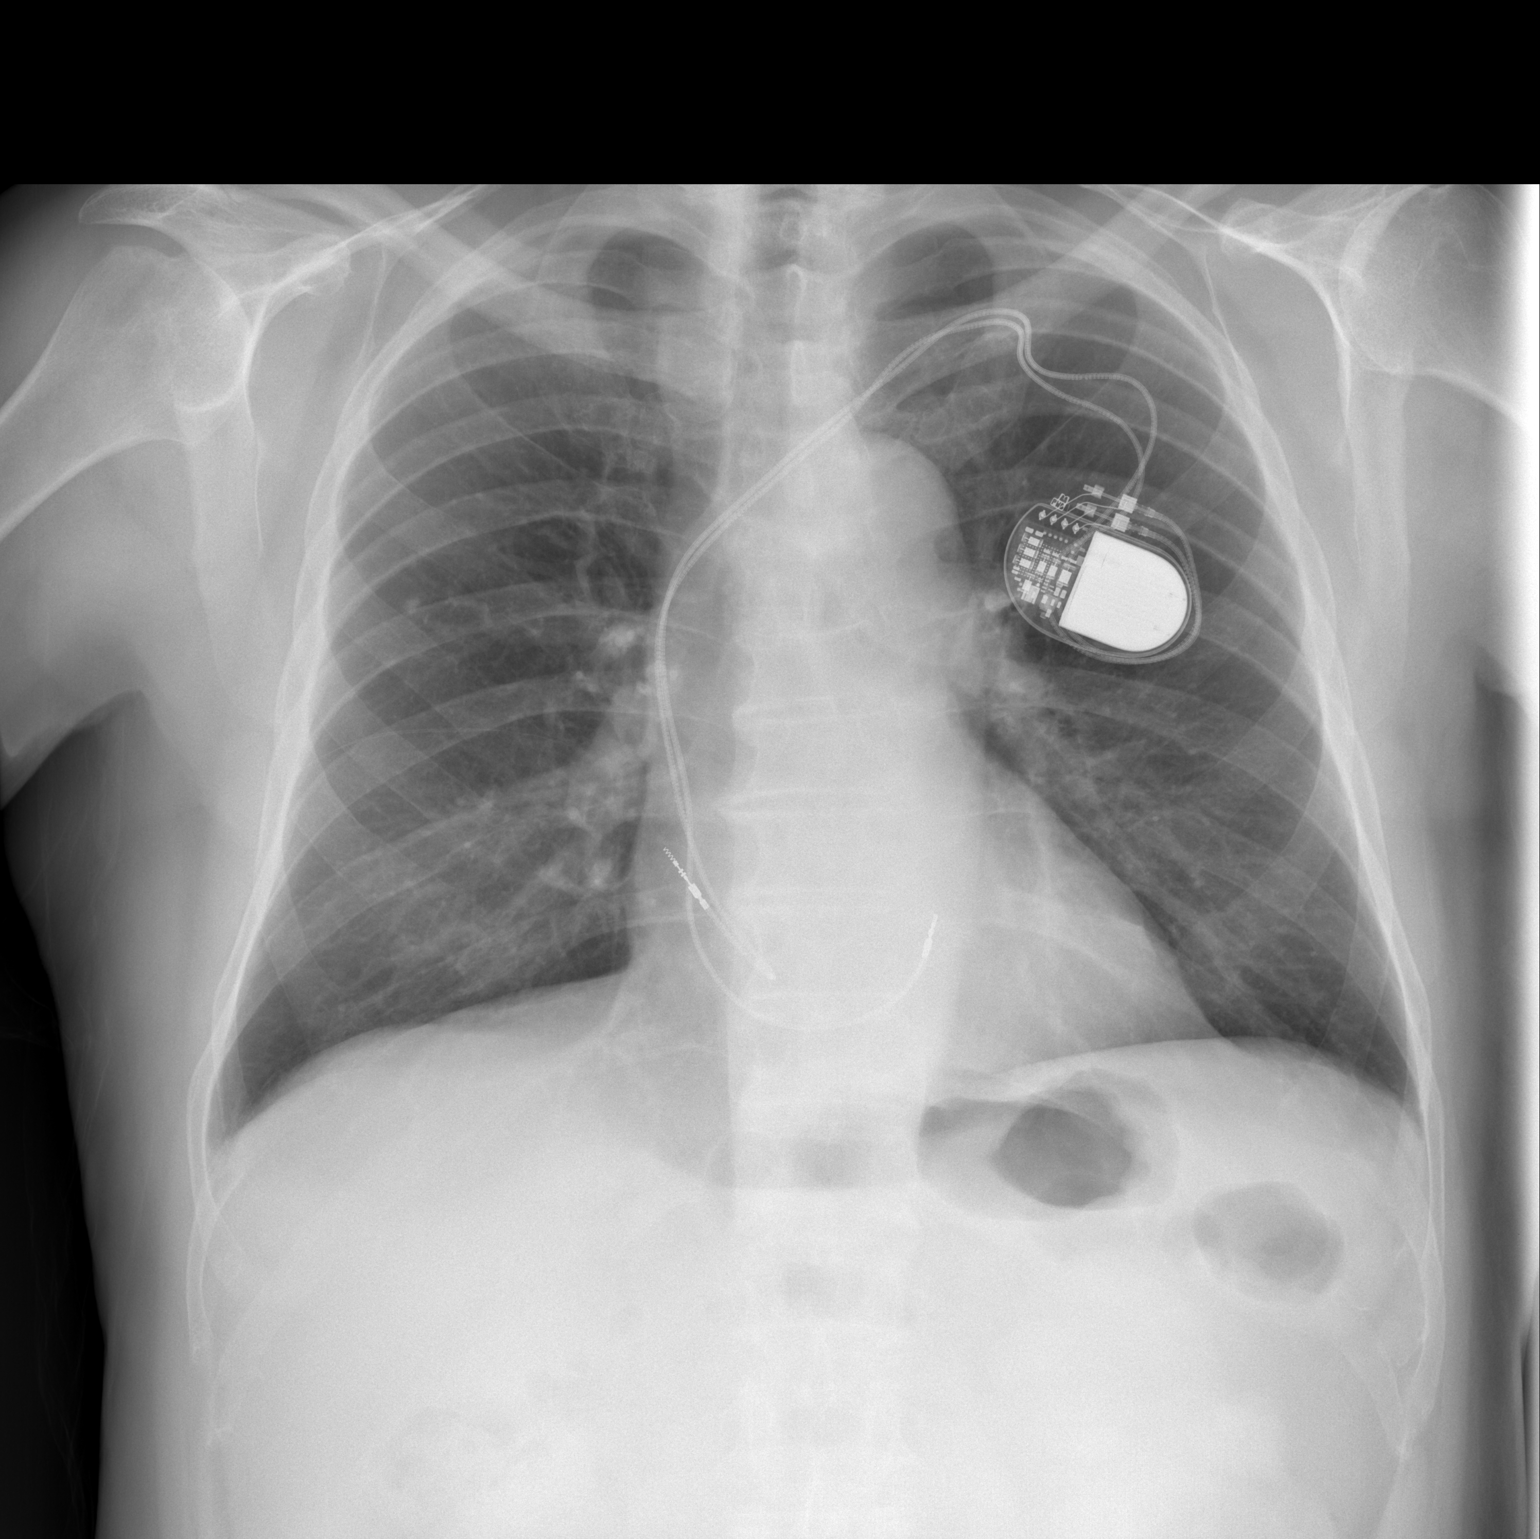

[w chest lat]
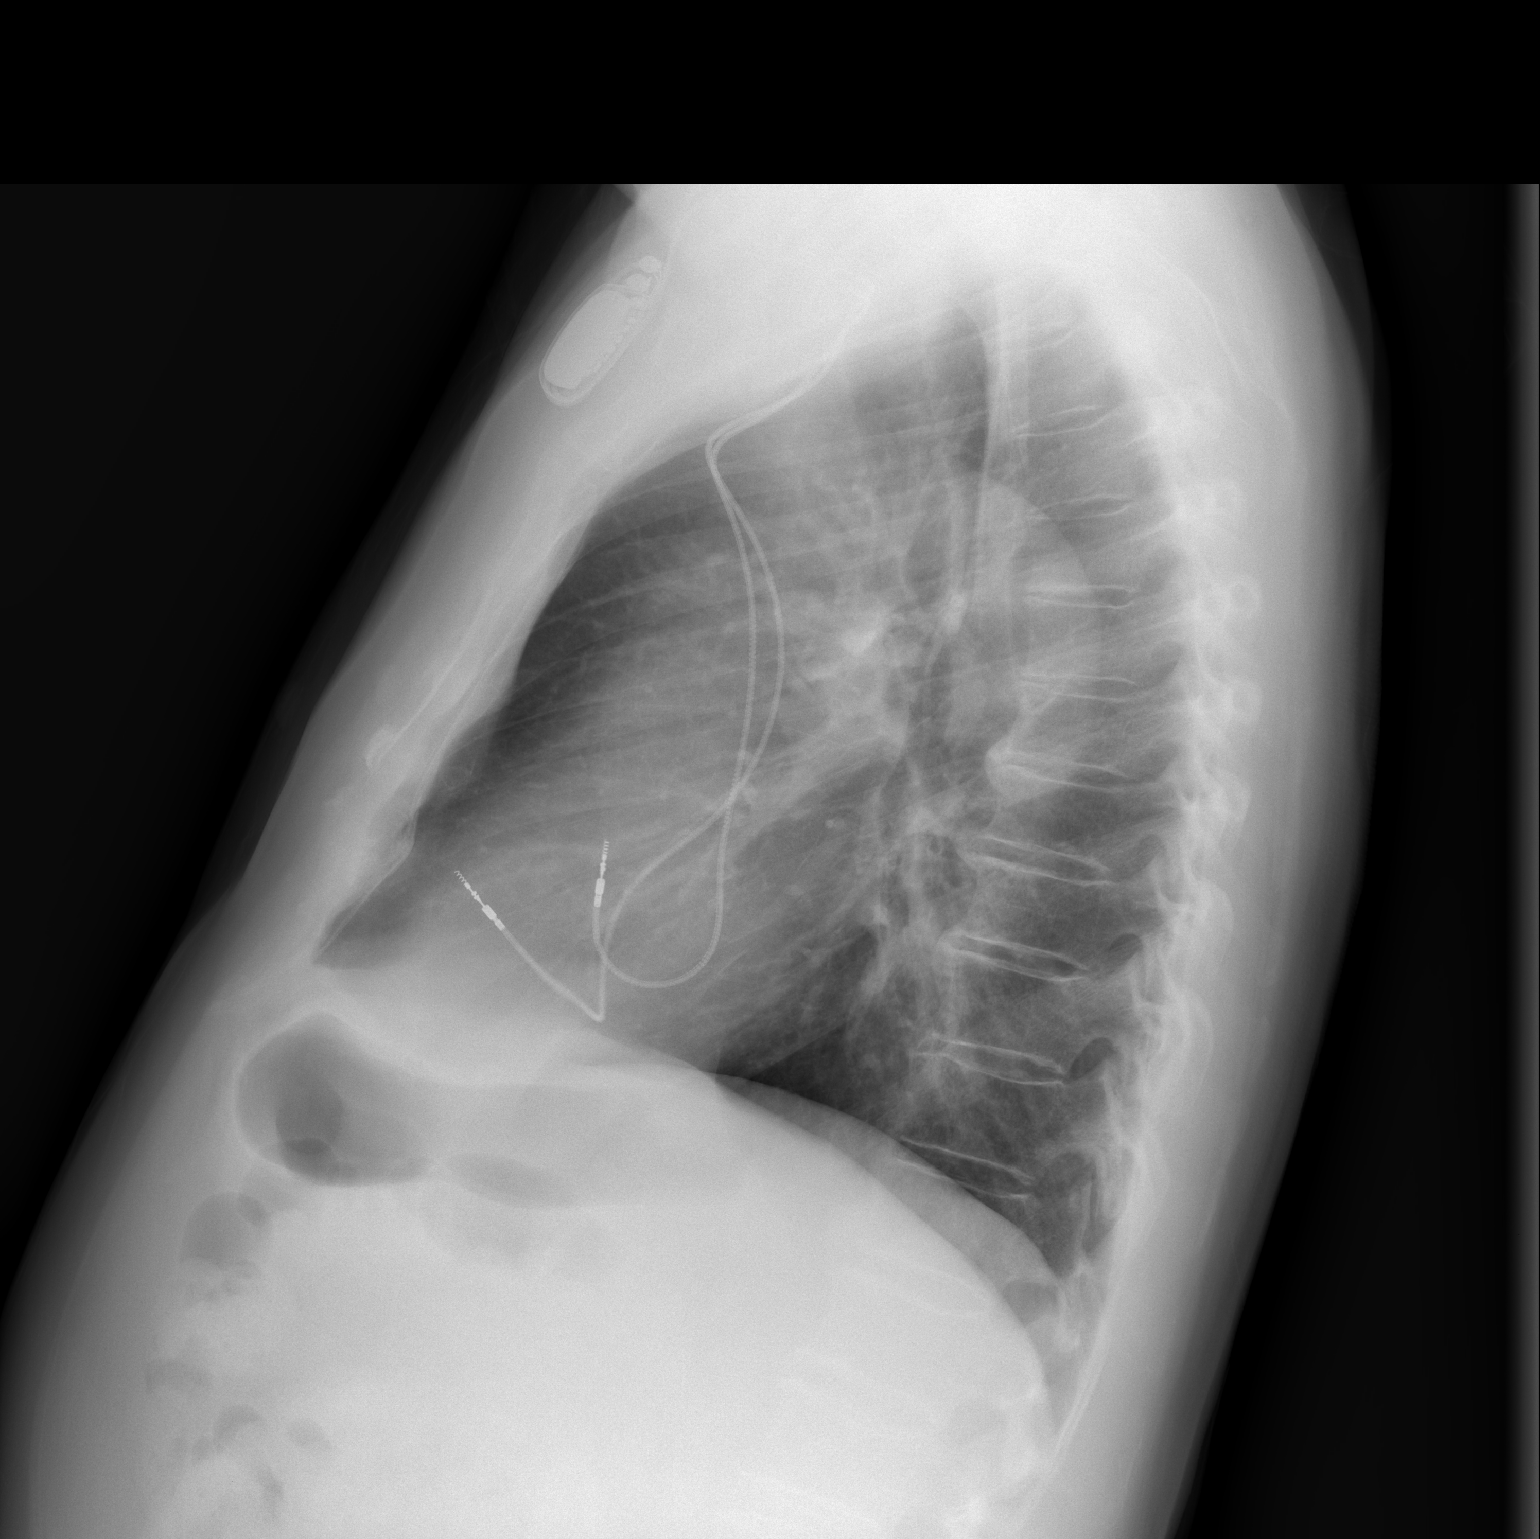

[2 of 2 positions shown; findings below may reference images not displayed]

FINDINGS: Heart and mediastinal contours remain normal.  Dual lead
pacer unchanged.  Several calcified granulomata again noted.  No
active disease.
IMPRESSION: Permanent cardiac pacer and scattered calcified granulomata - no
active disease or interval change.

## 2011-06-03 ENCOUNTER — Encounter: Payer: Self-pay | Admitting: *Deleted

## 2011-06-13 ENCOUNTER — Ambulatory Visit (INDEPENDENT_AMBULATORY_CARE_PROVIDER_SITE_OTHER): Payer: Medicare Other | Admitting: *Deleted

## 2011-06-13 ENCOUNTER — Encounter: Payer: Self-pay | Admitting: Internal Medicine

## 2011-06-13 DIAGNOSIS — R001 Bradycardia, unspecified: Secondary | ICD-10-CM

## 2011-06-13 DIAGNOSIS — I498 Other specified cardiac arrhythmias: Secondary | ICD-10-CM

## 2011-06-13 DIAGNOSIS — Z950 Presence of cardiac pacemaker: Secondary | ICD-10-CM

## 2011-06-13 LAB — PACEMAKER DEVICE OBSERVATION
AL IMPEDENCE PM: 507 Ohm
AL THRESHOLD: 0.375 V
BATTERY VOLTAGE: 2.79 V
RV LEAD AMPLITUDE: 8 mv
RV LEAD IMPEDENCE PM: 647 Ohm

## 2011-06-13 NOTE — Progress Notes (Signed)
Pacer check in clinic  

## 2011-08-16 ENCOUNTER — Inpatient Hospital Stay (HOSPITAL_COMMUNITY)
Admission: EM | Admit: 2011-08-16 | Discharge: 2011-08-20 | DRG: 659 | Disposition: A | Discharge status: Home Health | Payer: Medicare Other | Attending: Internal Medicine | Admitting: Internal Medicine

## 2011-08-16 ENCOUNTER — Emergency Department (HOSPITAL_COMMUNITY): Payer: Medicare Other

## 2011-08-16 ENCOUNTER — Inpatient Hospital Stay (HOSPITAL_COMMUNITY): Payer: Medicare Other

## 2011-08-16 ENCOUNTER — Encounter (HOSPITAL_COMMUNITY): Payer: Self-pay | Admitting: *Deleted

## 2011-08-16 DIAGNOSIS — Z8551 Personal history of malignant neoplasm of bladder: Secondary | ICD-10-CM

## 2011-08-16 DIAGNOSIS — J9600 Acute respiratory failure, unspecified whether with hypoxia or hypercapnia: Secondary | ICD-10-CM | POA: Diagnosis not present

## 2011-08-16 DIAGNOSIS — N201 Calculus of ureter: Secondary | ICD-10-CM

## 2011-08-16 DIAGNOSIS — Z906 Acquired absence of other parts of urinary tract: Secondary | ICD-10-CM

## 2011-08-16 DIAGNOSIS — N200 Calculus of kidney: Principal | ICD-10-CM | POA: Diagnosis present

## 2011-08-16 DIAGNOSIS — N3000 Acute cystitis without hematuria: Secondary | ICD-10-CM

## 2011-08-16 DIAGNOSIS — D7289 Other specified disorders of white blood cells: Secondary | ICD-10-CM

## 2011-08-16 DIAGNOSIS — R001 Bradycardia, unspecified: Secondary | ICD-10-CM

## 2011-08-16 DIAGNOSIS — D72829 Elevated white blood cell count, unspecified: Secondary | ICD-10-CM | POA: Diagnosis present

## 2011-08-16 DIAGNOSIS — I350 Nonrheumatic aortic (valve) stenosis: Secondary | ICD-10-CM | POA: Diagnosis present

## 2011-08-16 DIAGNOSIS — I359 Nonrheumatic aortic valve disorder, unspecified: Secondary | ICD-10-CM | POA: Diagnosis present

## 2011-08-16 DIAGNOSIS — I10 Essential (primary) hypertension: Secondary | ICD-10-CM | POA: Insufficient documentation

## 2011-08-16 DIAGNOSIS — N179 Acute kidney failure, unspecified: Secondary | ICD-10-CM

## 2011-08-16 DIAGNOSIS — Z938 Other artificial opening status: Secondary | ICD-10-CM

## 2011-08-16 DIAGNOSIS — F809 Developmental disorder of speech and language, unspecified: Secondary | ICD-10-CM | POA: Diagnosis present

## 2011-08-16 DIAGNOSIS — N19 Unspecified kidney failure: Secondary | ICD-10-CM

## 2011-08-16 DIAGNOSIS — N139 Obstructive and reflux uropathy, unspecified: Secondary | ICD-10-CM | POA: Diagnosis present

## 2011-08-16 DIAGNOSIS — Z66 Do not resuscitate: Secondary | ICD-10-CM | POA: Diagnosis present

## 2011-08-16 DIAGNOSIS — R112 Nausea with vomiting, unspecified: Secondary | ICD-10-CM | POA: Diagnosis present

## 2011-08-16 DIAGNOSIS — Z8546 Personal history of malignant neoplasm of prostate: Secondary | ICD-10-CM

## 2011-08-16 DIAGNOSIS — Z950 Presence of cardiac pacemaker: Secondary | ICD-10-CM | POA: Insufficient documentation

## 2011-08-16 DIAGNOSIS — M109 Gout, unspecified: Secondary | ICD-10-CM | POA: Diagnosis present

## 2011-08-16 DIAGNOSIS — N1330 Unspecified hydronephrosis: Secondary | ICD-10-CM | POA: Diagnosis present

## 2011-08-16 HISTORY — DX: Calculus of kidney: N20.0

## 2011-08-16 LAB — CBC
HCT: 41.1 % (ref 39.0–52.0)
Hemoglobin: 13.4 g/dL (ref 13.0–17.0)
MCH: 31.2 pg (ref 26.0–34.0)
MCH: 32.4 pg (ref 26.0–34.0)
MCHC: 32.6 g/dL (ref 30.0–36.0)
MCV: 95.6 fL (ref 78.0–100.0)
Platelets: 176 10*3/uL (ref 150–400)
Platelets: 145 10*3/uL — ABNORMAL LOW (ref 150–400)
RBC: 4.3 MIL/uL (ref 4.22–5.81)
RBC: 4.04 MIL/uL — ABNORMAL LOW (ref 4.22–5.81)
RDW: 14 % (ref 11.5–15.5)
RDW: 14 % (ref 11.5–15.5)
WBC: 22.9 10*3/uL — ABNORMAL HIGH (ref 4.0–10.5)
WBC: 2.7 10*3/uL — ABNORMAL LOW (ref 4.0–10.5)

## 2011-08-16 LAB — BLOOD GAS, ARTERIAL
Bicarbonate: 19.4 mEq/L — ABNORMAL LOW (ref 20.0–24.0)
Patient temperature: 98.6
TCO2: 17.3 mmol/L (ref 0–100)
pCO2 arterial: 27.8 mmHg — ABNORMAL LOW (ref 35.0–45.0)
pH, Arterial: 7.458 — ABNORMAL HIGH (ref 7.350–7.450)
pO2, Arterial: 82.8 mmHg (ref 80.0–100.0)

## 2011-08-16 LAB — PROCALCITONIN: Procalcitonin: 135.32 ng/mL

## 2011-08-16 LAB — URINALYSIS, ROUTINE W REFLEX MICROSCOPIC
Bilirubin Urine: NEGATIVE
Glucose, UA: NEGATIVE mg/dL
Ketones, ur: NEGATIVE mg/dL
Nitrite: NEGATIVE
Protein, ur: 30 mg/dL — AB
Specific Gravity, Urine: 1.015 (ref 1.005–1.030)
Urobilinogen, UA: 0.2 mg/dL (ref 0.0–1.0)
pH: 6.5 (ref 5.0–8.0)

## 2011-08-16 LAB — URINE MICROSCOPIC-ADD ON

## 2011-08-16 LAB — BASIC METABOLIC PANEL
BUN: 52 mg/dL — ABNORMAL HIGH (ref 6–23)
CO2: 25 mEq/L (ref 19–32)
Calcium: 8.8 mg/dL (ref 8.4–10.5)
Chloride: 100 mEq/L (ref 96–112)
Creatinine, Ser: 4.18 mg/dL — ABNORMAL HIGH (ref 0.50–1.35)
GFR calc Af Amer: 13 mL/min — ABNORMAL LOW (ref >90)
GFR calc non Af Amer: 12 mL/min — ABNORMAL LOW (ref >90)
Glucose, Bld: 118 mg/dL — ABNORMAL HIGH (ref 70–99)
Potassium: 4.2 mEq/L (ref 3.5–5.1)
Sodium: 140 mEq/L (ref 135–145)

## 2011-08-16 LAB — LACTIC ACID, PLASMA
Lactic Acid, Venous: 4.3 mmol/L — ABNORMAL HIGH (ref 0.5–2.2)
Lactic Acid, Venous: 1.6 mmol/L (ref 0.5–2.2)

## 2011-08-16 MED ORDER — FENTANYL CITRATE 0.05 MG/ML IJ SOLN
INTRAMUSCULAR | Status: AC | PRN
  Administered 2011-08-16: 25 ug via INTRAVENOUS

## 2011-08-16 MED ORDER — SODIUM CHLORIDE 0.9 % IV SOLN
INTRAVENOUS | Status: DC
  Administered 2011-08-16: 12:00:00 via INTRAVENOUS

## 2011-08-16 MED ORDER — IBUPROFEN 200 MG PO TABS
ORAL_TABLET | ORAL | Status: AC
  Filled 2011-08-16: qty 3

## 2011-08-16 MED ORDER — CEFTRIAXONE SODIUM 1 G IJ SOLR
1.0000 g | Freq: Once | INTRAMUSCULAR | Status: AC
  Administered 2011-08-16: 1 g via INTRAVENOUS
  Filled 2011-08-16: qty 10

## 2011-08-16 MED ORDER — HYDROMORPHONE HCL PF 1 MG/ML IJ SOLN: 0.5000 mg | INTRAMUSCULAR | Status: DC | PRN

## 2011-08-16 MED ORDER — MIDAZOLAM HCL 5 MG/5ML IJ SOLN
INTRAMUSCULAR | Status: AC | PRN
  Administered 2011-08-16: 1 mg via INTRAVENOUS

## 2011-08-16 MED ORDER — HYDROCODONE-ACETAMINOPHEN 5-325 MG PO TABS: 1.0000 | ORAL_TABLET | ORAL | Status: DC | PRN

## 2011-08-16 MED ORDER — ALBUTEROL SULFATE (5 MG/ML) 0.5% IN NEBU: 2.5000 mg | INHALATION_SOLUTION | RESPIRATORY_TRACT | Status: DC | PRN

## 2011-08-16 MED ORDER — ACETAMINOPHEN 500 MG PO TABS
1000.0000 mg | ORAL_TABLET | Freq: Once | ORAL | Status: AC
  Administered 2011-08-16: 1000 mg via ORAL

## 2011-08-16 MED ORDER — SODIUM CHLORIDE 0.9 % IV BOLUS (SEPSIS)
500.0000 mL | INTRAVENOUS | Status: AC
  Administered 2011-08-16: 500 mL via INTRAVENOUS

## 2011-08-16 MED ORDER — FEBUXOSTAT 40 MG PO TABS
40.0000 mg | ORAL_TABLET | Freq: Every day | ORAL | Status: DC
  Administered 2011-08-16 – 2011-08-20 (×5): 40 mg via ORAL
  Filled 2011-08-16 (×5): qty 1

## 2011-08-16 MED ORDER — ASPIRIN 81 MG PO CHEW
81.0000 mg | CHEWABLE_TABLET | Freq: Every day | ORAL | Status: DC
  Administered 2011-08-16 – 2011-08-20 (×5): 81 mg via ORAL
  Filled 2011-08-16 (×5): qty 1

## 2011-08-16 MED ORDER — SODIUM CHLORIDE 0.9 % IV SOLN
INTRAVENOUS | Status: DC
  Administered 2011-08-16 – 2011-08-17 (×2): via INTRAVENOUS
  Administered 2011-08-17: 50 mL/h via INTRAVENOUS

## 2011-08-16 MED ORDER — ACETAMINOPHEN 500 MG PO TABS
ORAL_TABLET | ORAL | Status: AC
  Administered 2011-08-16: 1000 mg via ORAL
  Filled 2011-08-16: qty 2

## 2011-08-16 MED ORDER — ENOXAPARIN SODIUM 30 MG/0.3ML ~~LOC~~ SOLN
30.0000 mg | SUBCUTANEOUS | Status: DC
  Administered 2011-08-16 – 2011-08-19 (×4): 30 mg via SUBCUTANEOUS
  Filled 2011-08-16 (×6): qty 0.3

## 2011-08-16 MED ORDER — COLCHICINE 0.6 MG PO TABS
0.6000 mg | ORAL_TABLET | ORAL | Status: DC | PRN
  Filled 2011-08-16: qty 1

## 2011-08-16 MED ORDER — VANCOMYCIN HCL IN DEXTROSE 1-5 GM/200ML-% IV SOLN
1000.0000 mg | INTRAVENOUS | Status: DC
  Filled 2011-08-16: qty 200

## 2011-08-16 MED ORDER — ACETAMINOPHEN 325 MG PO TABS
650.0000 mg | ORAL_TABLET | Freq: Four times a day (QID) | ORAL | Status: DC | PRN
  Administered 2011-08-18: 650 mg via ORAL
  Filled 2011-08-16: qty 2

## 2011-08-16 MED ORDER — FUROSEMIDE 10 MG/ML IJ SOLN
20.0000 mg | Freq: Once | INTRAMUSCULAR | Status: AC
  Administered 2011-08-16: 17:00:00 via INTRAVENOUS
  Filled 2011-08-16: qty 4

## 2011-08-16 MED ORDER — SODIUM CHLORIDE 0.9 % IV SOLN
INTRAVENOUS | Status: DC
  Administered 2011-08-16: 300 mL via INTRAVENOUS

## 2011-08-16 MED ORDER — CEFTRIAXONE SODIUM 1 G IJ SOLR
1.0000 g | INTRAMUSCULAR | Status: DC
  Filled 2011-08-16: qty 10

## 2011-08-16 MED ORDER — IBUPROFEN 200 MG PO TABS
ORAL_TABLET | ORAL | Status: AC
  Administered 2011-08-16: 200 mg via ORAL
  Filled 2011-08-16: qty 1

## 2011-08-16 MED ORDER — ASPIRIN 81 MG PO TABS: 81.0000 mg | ORAL_TABLET | Freq: Every day | ORAL | Status: DC

## 2011-08-16 MED ORDER — CITALOPRAM HYDROBROMIDE 20 MG PO TABS
20.0000 mg | ORAL_TABLET | Freq: Every day | ORAL | Status: DC
  Administered 2011-08-16 – 2011-08-20 (×5): 20 mg via ORAL
  Filled 2011-08-16 (×6): qty 1

## 2011-08-16 MED ORDER — VANCOMYCIN HCL IN DEXTROSE 1-5 GM/200ML-% IV SOLN
1000.0000 mg | Freq: Once | INTRAVENOUS | Status: AC
  Administered 2011-08-16: 1000 mg via INTRAVENOUS
  Filled 2011-08-16: qty 200

## 2011-08-16 MED ORDER — PIPERACILLIN-TAZOBACTAM 3.375 G IVPB
3.3750 g | Freq: Once | INTRAVENOUS | Status: AC
  Administered 2011-08-16: 3.375 g via INTRAVENOUS
  Filled 2011-08-16: qty 50

## 2011-08-16 MED ORDER — IBUPROFEN 200 MG PO TABS
600.0000 mg | ORAL_TABLET | Freq: Once | ORAL | Status: AC
  Administered 2011-08-16: 200 mg via ORAL

## 2011-08-16 MED ORDER — PIPERACILLIN-TAZOBACTAM IN DEX 2-0.25 GM/50ML IV SOLN
2.2500 g | Freq: Three times a day (TID) | INTRAVENOUS | Status: DC
  Administered 2011-08-17 – 2011-08-19 (×7): 2.25 g via INTRAVENOUS
  Filled 2011-08-16 (×10): qty 50

## 2011-08-16 NOTE — ED Notes (Signed)
MD at bedside. 

## 2011-08-16 NOTE — ED Provider Notes (Signed)
History     CSN: [id number]  Arrival date & time 08/16/11  0714   First MD Initiated Contact with Patient 08/16/11 [phone]      Chief Complaint  Patient presents with  . Flank Pain    nausea    (Consider location/radiation/quality/duration/timing/severity/associated sxs/prior treatment) HPI Patient presents emergency Dept. with left flank pain that began Wednesday night.  Patient states that he started vomiting at that time.  Patient states he has not vomited since that time.  He states that he did fall last night at midnight.  He states he developed some groin pain yesterday evening, associated with this pain.  Patient denies chest pain, shortness of breath, fever, weakness, blood in his urine, diarrhea, or nausea.  Patient, states he has had kidney and bladder problems.  More specifically for bladder cancer and dysfunction of his right kidney. Past Medical History  Diagnosis Date  . Hypertension   . Aortic valve stenosis   . Gout   . Hx of bladder cancer   . Deep venous thrombosis     hx of  . Prostate cancer   . Osteoporosis   . Renal failure   . Heart murmur   . Depression   . Chronic kidney disease     Past Surgical History  Procedure Date  . Insert / replace / remove pacemaker   . Cystectomy   . Lymphadenectomy   . Prostate surgery   . Appendectomy   . Cataract extraction, bilateral     Family History  Problem Relation Age of Onset  . Prostate cancer Father 66    died  . Pancreatic cancer Mother 75    died    History  Substance Use Topics  . Smoking status: Former Smoker    Types: Cigarettes    Quit date: 03/25/1952  . Smokeless tobacco: Not on file  . Alcohol Use: No      Review of Systems All other systems negative except as documented in the HPI. All pertinent positives and negatives as reviewed in the HPI.  Allergies  Review of patient's allergies indicates no known allergies.  Home Medications   Current Outpatient Rx  Name Route Sig  Dispense Refill  . ACETAMINOPHEN 325 MG PO TABS Oral Take 650 mg by mouth every 6 (six) hours as needed.      . ASPIRIN 81 MG PO TABS Oral Take 81 mg by mouth daily.      Marland Kitchen VITAMIN D-3 PO Oral Take by mouth. 1000 iu  daily     . CITALOPRAM HYDROBROMIDE 20 MG PO TABS Oral Take 20 mg by mouth daily.      . COLCHICINE 0.6 MG PO TABS Oral Take 0.6 mg by mouth as needed.      Marland Kitchen ONE-DAILY MULTI VITAMINS PO TABS Oral Take 1 tablet by mouth daily.      Marland Kitchen FISH OIL 1000 MG PO CAPS Oral Take 1,000 mg by mouth daily.      Marland Kitchen ULORIC 40 MG PO TABS        BP 116/58  Pulse 85  Temp(Src) 97.9 F (36.6 C) (Oral)  Resp 26  SpO2 98%  Physical Exam  Nursing note and vitals reviewed. Constitutional: He is oriented to person, place, and time. He appears well-developed and well-nourished. He is cooperative.  Non-toxic appearance. He does not have a sickly appearance. He does not appear ill. No distress.  HENT:  Head: Normocephalic and atraumatic.  Neck: Normal range of motion. Neck supple.  Cardiovascular: Normal rate and regular rhythm.  Exam reveals no gallop and no friction rub.   Murmur heard. Pulmonary/Chest: Effort normal and breath sounds normal.  Abdominal: Soft. Bowel sounds are normal. He exhibits no distension. There is no tenderness. There is no rebound and no guarding.  Neurological: He is alert and oriented to person, place, and time.  Skin: Skin is warm and dry.    ED Course  Procedures (including critical care time)  The patient will need admission to the hospital as he has a 12 mm UVJ stone.  He has got  renal impairment that is worsened.   [place], PA-C 08/16/11 1125

## 2011-08-16 NOTE — ED Notes (Addendum)
Pt changed over to 4lnc and maintaining o2sat at 100%. RR nonlabored and lungs clear. Pt denies c/o pain however pt is confused and fumbling with lead wires and o2sat monitor. Family at bedside and assisting to reorient pt. Pt now without tremors and resting without s/s distress.

## 2011-08-16 NOTE — ED Notes (Signed)
Awaiting room assignment  

## 2011-08-16 NOTE — ED Notes (Signed)
Pt reports left sided groin and back pain since last night, nausea, and vomited Wednesday. Reports falling last night at 12a and denies pain or injury.  Hx of kidney stones x1 year ago which was removed.  Took tylenol last night for flank pain.  Pt has a pacemaker and hx of bladder cancer.

## 2011-08-16 NOTE — ED Notes (Signed)
Patient transported to CT 

## 2011-08-16 NOTE — Consult Note (Signed)
Reason for Consult:Obstructing left renal stone with sepsis. Referring Physician: Dr. Jola Schmidt  [name] [name] is an 78 y.o. male.  HPI: [name] [name] is well known to me.  He has a history of bladder cancer with prior cystoprostatectomy with urethrectomy and ileal conduit several years ago.  He has a history of stones and had a previous right PCNL for an obstructing stone.   He last saw me about 4 months ago and he had a 30mm LLQ renal stone at that time.   He presents to the ER now with the onset Weds evening of nausea and LUQ pain.  He was found on CT today to have a 39mm obstructing left proximal ureteral stone with smaller renal stones.  He had an elevated WBC as well as a Cr of 4.  I spoke with Dr. Venora Maples over the phone this morning and recommended a Left perc tube which was placed without difficulty.   He now has a fever to 102 rectally with rigors post tube placement.  He is has received Rocephin and will be admitted to the hospitalist service.    Past Medical History  Diagnosis Date  . Hypertension   . Aortic valve stenosis   . Gout   . Hx of bladder cancer   . Deep venous thrombosis     hx of  . Prostate cancer   . Osteoporosis   . Renal failure   . Heart murmur   . Depression   . Chronic kidney disease   . Kidney stone     Past Surgical History  Procedure Date  . Insert / replace / remove pacemaker   . Cystectomy   . Lymphadenectomy   . Prostate surgery   . Appendectomy   . Cataract extraction, bilateral   . Kidney stone surgery   . Ilial conduit     for bladder cancer    Family History  Problem Relation Age of Onset  . Prostate cancer Father 56    died  . Pancreatic cancer Mother 69    died    Social History:  reports that he quit smoking about 59 years ago. His smoking use included Cigarettes. He has never used smokeless tobacco. He reports that he does not drink alcohol or use illicit drugs.  Allergies: No Known Allergies  Medications: I have  reviewed the patient's current medications. Prior to Admission:  (Not in a hospital admission)  Results for orders placed during the hospital encounter of 08/16/11 (from the past 48 hour(s))  CBC     Status: Abnormal   Collection Time   08/16/11  8:00 AM      Component Value Range Comment   WBC 22.9 (*) 4.0 - 10.5 (K/uL)    RBC 4.30  4.22 - 5.81 (MIL/uL)    Hemoglobin 13.4  13.0 - 17.0 (g/dL)    HCT 41.1  39.0 - 52.0 (%)    MCV 95.6  78.0 - 100.0 (fL)    MCH 31.2  26.0 - 34.0 (pg)    MCHC 32.6  30.0 - 36.0 (g/dL)    RDW 14.0  11.5 - 15.5 (%)    Platelets 176  150 - 400 (K/uL)   BASIC METABOLIC PANEL     Status: Abnormal   Collection Time   08/16/11  8:00 AM      Component Value Range Comment   Sodium 140  135 - 145 (mEq/L)    Potassium 4.2  3.5 - 5.1 (mEq/L)  Chloride 100  96 - 112 (mEq/L)    CO2 25  19 - 32 (mEq/L)    Glucose, Bld 118 (*) 70 - 99 (mg/dL)    BUN 52 (*) 6 - 23 (mg/dL)    Creatinine, Ser 4.18 (*) 0.50 - 1.35 (mg/dL)    Calcium 8.8  8.4 - 10.5 (mg/dL)    GFR calc non Af Amer 12 (*) >90 (mL/min)    GFR calc Af Amer 13 (*) >90 (mL/min)   PROTIME-INR     Status: Abnormal   Collection Time   08/16/11 10:15 AM      Component Value Range Comment   Prothrombin Time 15.7 (*) 11.6 - 15.2 (seconds)    INR 1.22  0.00 - 1.49    URINALYSIS, ROUTINE W REFLEX MICROSCOPIC     Status: Abnormal   Collection Time   08/16/11 10:20 AM      Component Value Range Comment   Color, Urine YELLOW  YELLOW     APPearance CLEAR  CLEAR     Specific Gravity, Urine 1.015  1.005 - 1.030     pH 6.5  5.0 - 8.0     Glucose, UA NEGATIVE  NEGATIVE (mg/dL)    Hgb urine dipstick LARGE (*) NEGATIVE     Bilirubin Urine NEGATIVE  NEGATIVE     Ketones, ur NEGATIVE  NEGATIVE (mg/dL)    Protein, ur 30 (*) NEGATIVE (mg/dL)    Urobilinogen, UA 0.2  0.0 - 1.0 (mg/dL)    Nitrite NEGATIVE  NEGATIVE     Leukocytes, UA MODERATE (*) NEGATIVE    URINE MICROSCOPIC-ADD ON     Status: Abnormal   Collection  Time   08/16/11 10:20 AM      Component Value Range Comment   Squamous Epithelial / LPF RARE  RARE     WBC, UA 11-20  <3 (WBC/hpf)    RBC / HPF 7-10  <3 (RBC/hpf)    Bacteria, UA FEW (*) RARE      Ct Abdomen Pelvis Wo Contrast  08/16/2011  *RADIOLOGY REPORT*  Clinical Data: Left flank pain and history of bilateral renal calculi including prior right nephrolithotomy on 07/10/2010. History of bladder carcinoma with prior cystectomy and ileal conduit formation.  CT ABDOMEN AND PELVIS WITHOUT CONTRAST  Technique:  Multidetector CT imaging of the abdomen and pelvis was performed following the standard protocol without intravenous contrast.  Comparison: 05/09/2010  Findings: There is evidence of moderately severe left-sided hydronephrosis secondary to an obstructing calculus at the ureteropelvic junction measuring approximately 5 x 9 x 12 mm. Nonobstructing lower pole calculi are present measuring approximately 3 mm and 5 mm in respective greatest diameter.  No other left-sided calculi or calcifications in the ileal conduit are identified.  The ileal conduit is nondistended and shows no gross abnormalities at the level of a right lower quadrant ileostomy. There is a tiny nonobstructing calculus in the lower pole of the right kidney.  The right kidney is relatively atrophic compared to the left.  Stable renal cyst in the posterior right kidney.  Stable gallstones.  No abnormal fluid collections.  Other unenhanced solid organs and bowel are unremarkable.  Stable left inguinal hernia containing fat.  IMPRESSION: Moderately severe left hydronephrosis secondary to an obstructing calculus at the UPJ measuring 12 mm in greatest dimensions.  Other nonobstructing calculi are present bilaterally.  Original Report Authenticated By: Azzie Roup, M.D.    Review of Systems  Constitutional: Positive for fever and chills.  Gastrointestinal: Positive  for nausea and abdominal pain.  All other systems reviewed and are  negative.   Blood pressure 155/70, pulse 75, temperature 97.9 F (36.6 C), temperature source Oral, resp. rate 24, SpO2 100.00%. Physical Exam  Constitutional: He is oriented to person, place, and time. He appears well-developed and well-nourished.  Cardiovascular:       He is tachycardic  Respiratory: Effort normal and breath sounds normal.       With tachypnea.  GI:       Soft with left upper quadrant and CVA tenderness with some guarding.  He has a stoma in the RLQ.  Musculoskeletal: He exhibits no edema.  Neurological: He is alert and oriented to person, place, and time.  Skin: Skin is warm and dry.  Psychiatric: He has a normal mood and affect.    Assessment/Plan: He has an obstructing left proximal ureteral stone with acute on chronic renal insufficiency and probable sepsis.  He will need to be treated for his infection and once he has recovered from that, he will need a left percutaneous nephrolithotomy.   I will follow while in the hospital.  Malka So 08/16/2011, 4:46 PM

## 2011-08-16 NOTE — Progress Notes (Signed)
eLink Physician-Brief Progress Note Patient Name: [name] [name] DOB: 1921-06-18 MRN: [id number]  Date of Service  08/16/2011   HPI/Events of Note   EICU sepsis alert fired. Pt with lactic acidosis and sepsis d/t renal stone dz s/p nephrostomy tube placement  eICU Interventions  Recheck lactic acid to see if needs septic shock protocol   Intervention Category Major Interventions: Shock - evaluation and management  Asencion Noble 08/16/2011, 7:28 PM

## 2011-08-16 NOTE — ED Notes (Addendum)
Per Marzetta Board from  Interventional radiology.  Nephrostomy tube was placed and 2 mcg of fentanyl and 1 mg versed given.  Pt alert and oriented and urine sample sent to lab.

## 2011-08-16 NOTE — ED Provider Notes (Addendum)
Medical screening examination/treatment/procedure(s) were conducted as a shared visit with non-physician practitioner(s) and myself.  I personally evaluated the patient during the encounter  The patient has evidence of a left ureteral stone with obstruction and new renal failure.  Given his atrophy of his right kidney on CT scan his left kidney is probably is only functional kidney.  I suspect his stones leading to obstructive uropathy.  I discussed this case with urology who agreed with my assessment and recommends left-sided nephrostomy placement by interventional radiology.  I discussed the case with interventional radiology who will schedule the patient for nephrostomy tube later today.  The patient will be admitted to the medical service overnight for ongoing care.  Given his white count 20,000 and a urine sample back I be concerned about a associated urinary tract infection/infected stone and thus the patient be dosed with Rocephin at this time.   1. Ureteral stone   2. Renal failure    Dr Jeffie Pollock- Urology Dr Kathlene Cote- IR  Hoy Morn, MD 08/16/11 1134  4:48 PM Patient just returned from interventional radiology.  He developed right ureters and has a rectal temperature 102.8.  Tylenol acetaminophen now.  He may be developing urosepsis.  Lactate for calcitonin added.  The patient will be broadened to vancomycin and Zosyn.  Repeat CBC.  The hospitalist is aware of this and is now currently at the bedside to help evaluate the patient and his change in condition here in the emergency department.  He'll be changed to a step down bed.   CRITICAL CARE Performed by: Hoy Morn Total critical care time: 32 Critical care time was exclusive of separately billable procedures and treating other patients. Critical care was necessary to treat or prevent imminent or life-threatening deterioration. Critical care was time spent personally by me on the following activities: development of treatment plan  with patient and/or surrogate as well as nursing, discussions with consultants, evaluation of patient's response to treatment, examination of patient, obtaining history from patient or surrogate, ordering and performing treatments and interventions, ordering and review of laboratory studies, ordering and review of radiographic studies, pulse oximetry and re-evaluation of patient's condition.   Hoy Morn, MD 08/16/11 [place], MD 08/16/11 [phone]

## 2011-08-16 NOTE — ED Notes (Signed)
Pt transported to IR 

## 2011-08-16 NOTE — ED Notes (Signed)
Pt returned from I.R. With profuse bleeding from L AC IV site, hub was disengaged from tubing. While IV being secured, pt began having tremors (whole body) and o2 sats dropped from 97 to 86 on room air. O24lnc placed and no increase in sat, pt began having tachypnic RR and Sats driopped to 77 on 4lnc. NRB placed at 15lnc and sats slowly up to 90's. Dr Rosana Hoes was paged and Dr. Venora Maples EDP at bedside with charge nurse. Pt temp rectally 102.8. New orders given per Dr. Venora Maples for ibuprofen 600mg  and tylenol. Dr. Rosana Hoes called back and on her way, ordered decrease in IV fluids to [facility] and lasix 20mg  IV. Pts tremors continued until 16;50. New order then given for ABG. O2sats up to 98-100% on NRB.

## 2011-08-16 NOTE — ED Notes (Signed)
155ml drainage from nephrostomy bag.  Drainage is bloody.

## 2011-08-16 NOTE — H&P (Signed)
Agree 

## 2011-08-16 NOTE — ED Notes (Signed)
Family at bedside. Son 

## 2011-08-16 NOTE — H&P (Signed)
CC: obstruction renal calculi with leukocytosis, hydronephrosis and worsening renal insufficiency.  Urology service has requested PCN placement.   HPI:  See note from urology evaluation in ED :  Brent General, PA-C [place] Signed  ED Provider Notes 08/16/2011 7:44 AM  History     CSN: [id number]  Arrival date & time 08/16/11  0714   First MD Initiated Contact with Patient 08/16/11 [phone]         Chief Complaint   Patient presents with   .  Flank Pain       nausea     (Consider location/radiation/quality/duration/timing/severity/associated sxs/prior treatment) HPI Patient presents emergency Dept. with left flank pain that began Wednesday night.  Patient states that he started vomiting at that time.  Patient states he has not vomited since that time.  He states that he did fall last night at midnight.  He states he developed some groin pain yesterday evening, associated with this pain.  Patient denies chest pain, shortness of breath, fever, weakness, blood in his urine, diarrhea, or nausea.  Patient, states he has had kidney and bladder problems.  More specifically for bladder cancer and dysfunction of his right kidney. Past Medical History   Diagnosis  Date   .  Hypertension     .  Aortic valve stenosis     .  Gout     .  Hx of bladder cancer     .  Deep venous thrombosis         hx of   .  Prostate cancer     .  Osteoporosis     .  Renal failure     .  Heart murmur     .  Depression     .  Chronic kidney disease         Past Surgical History   Procedure  Date   .  Insert / replace / remove pacemaker     .  Cystectomy     .  Lymphadenectomy     .  Prostate surgery     .  Appendectomy     .  Cataract extraction, bilateral         Family History   Problem  Relation  Age of Onset   .  Prostate cancer  Father  37       died   .  Pancreatic cancer  Mother  38       died       History   Substance Use Topics   .  Smoking status:  Former  Smoker       Types:  Cigarettes       Quit date:  03/25/1952   .  Smokeless tobacco:  Not on file   .  Alcohol Use:  No     Review of Systems All other systems negative except as documented in the HPI. All pertinent positives and negatives as reviewed in the HPI.    Allergies   Review of patient's allergies indicates no known allergies.    Home Medications      Current Outpatient Rx   Name  Route  Sig  Dispense  Refill   .  ACETAMINOPHEN 325 MG PO TABS  Oral  Take 650 mg by mouth every 6 (six) hours as needed.         .  ASPIRIN 81 MG PO TABS  Oral  Take 81 mg by mouth daily.         Marland Kitchen  VITAMIN D-3 PO  Oral  Take by mouth. 1000 iu  daily        .  CITALOPRAM HYDROBROMIDE 20 MG PO TABS  Oral  Take 20 mg by mouth daily.         .  COLCHICINE 0.6 MG PO TABS  Oral  Take 0.6 mg by mouth as needed.         Marland Kitchen  ONE-DAILY MULTI VITAMINS PO TABS  Oral  Take 1 tablet by mouth daily.         Marland Kitchen  FISH OIL 1000 MG PO CAPS  Oral  Take 1,000 mg by mouth daily.         Marland Kitchen  ULORIC 40 MG PO TABS             BP 116/58  Pulse 85  Temp(Src) 97.9 F (36.6 C) (Oral)  Resp 26  SpO2 98%  Physical Exam  Nursing note and vitals reviewed. Constitutional: He is oriented to person, place, and time. He appears well-developed and well-nourished. He is cooperative.  Non-toxic appearance. He does not have a sickly appearance. He does not appear ill. No distress.  HENT:   Head: Normocephalic and atraumatic.  Neck: Normal range of motion. Neck supple.  Cardiovascular: Normal rate and regular rhythm.  Exam reveals no gallop and no friction rub.    Murmur heard. Pulmonary/Chest: Effort normal and breath sounds normal.  Abdominal: Soft. Bowel sounds are normal. He exhibits no distension. There is no tenderness. There is no rebound and no guarding.  Neurological: He is alert and oriented to person, place, and time.  Skin: Skin is warm and dry.      ED Course   Procedures (including critical care time)  The  patient will need admission to the hospital as he has a 12 mm UVJ stone.  He has got  renal impairment that is worsened.     [place], PA-C 08/16/11 1125  Cosigned by: Hoy Morn, MD    [08/16/2011 11:34 AM]     LABS :Results for HERSHY, SMOLIK (MRN [id number]) as of 08/16/2011 11:55  Ref. Range 08/16/2011 08:00  Sodium Latest Range: 135-145 mEq/L 140  Potassium Latest Range: 3.5-5.1 mEq/L 4.2  Chloride Latest Range: 96-112 mEq/L 100  CO2 Latest Range: 19-32 mEq/L 25  BUN Latest Range: 6-23 mg/dL 52 (H)  Creat Latest Range: 0.50-1.35 mg/dL 4.18 (H)  Calcium Latest Range: 8.4-10.5 mg/dL 8.8  GFR calc non Af Amer Latest Range: >90 mL/min 12 (L)  GFR calc Af Amer Latest Range: >90 mL/min 13 (L)  Glucose Latest Range: 70-99 mg/dL 118 (H)  WBC Latest Range: 4.0-10.5 K/uL 22.9 (H)  RBC Latest Range: 4.22-5.81 MIL/uL 4.30  Hemoglobin Latest Range: 13.0-17.0 g/dL 13.4  HCT Latest Range: 39.0-52.0 % 41.1  MCV Latest Range: 78.0-100.0 fL 95.6  MCH Latest Range: 26.0-34.0 pg 31.2  MCHC Latest Range: 30.0-36.0 g/dL 32.6  RDW Latest Range: 11.5-15.5 % 14.0  Platelets Latest Range: 150-400 K/uL 176     A/P :  CT imaging evaluates stones and hydro - amendable to percutaneous nephrostomy to relieve hydronephrosis and improve renal function.  Procedure details, risks and benefits reviewed with patient and children with all their questions answered to their satisfaction. Written consent obtained.  Labs WNL to proceed.

## 2011-08-16 NOTE — H&P (Signed)
PCP:   Jerlyn Ly, MD, MD   Chief Complaint:  Nausea vomiting and right flank pain  HPI: Patient is an 78 year old white male with past medical history significant for multiple medical problems including prostate cancer and bladder cancer status post complete cystectomy with ureteroileal conduit. Patient stated that for the past 2 days he's been having nausea vomiting and flank pain. Pain radiates into the right groin region. He's been having some chills but no fevers. He took Tylenol for the pain with some relief. His symptoms were progressing so he presented to the emergency room. He had a CT of the abdomen and pelvis that shows a right  UPJ obstructing stone with moderate to severe hydronephrosis. We were asked to admit patient for the above.  Review of Systems:  The patient denies anorexia, fever, weight loss,, vision loss, decreased hearing, hoarseness, chest pain, syncope, dyspnea on exertion, peripheral edema, balance deficits, hemoptysis, abdominal pain, melena, hematochezia, severe indigestion/heartburn, hematuria, incontinence, genital sores, muscle weakness, suspicious skin lesions, transient blindness, difficulty walking, depression, unusual weight change, abnormal bleeding, enlarged lymph nodes, angioedema, and breast masses.  Past Medical History: Past Medical History  Diagnosis Date  . Hypertension   . Aortic valve stenosis   . Gout   . Hx of bladder cancer   . Deep venous thrombosis     hx of  . Prostate cancer   . Osteoporosis   . Renal failure   . Heart murmur   . Depression   . Chronic kidney disease   . Kidney stone    Past Surgical History  Procedure Date  . Insert / replace / remove pacemaker   . Cystectomy   . Lymphadenectomy   . Prostate surgery   . Appendectomy   . Cataract extraction, bilateral   . Kidney stone surgery   . Ilial conduit     for bladder cancer    Medications: Prior to Admission medications   Medication Sig Start Date End Date  Taking? Authorizing Provider  acetaminophen (TYLENOL) 325 MG tablet Take 650 mg by mouth every 6 (six) hours as needed. For pain   Yes Historical Provider, MD  aspirin 81 MG tablet Take 81 mg by mouth daily.     Yes Historical Provider, MD  Cholecalciferol (VITAMIN D-3 PO) Take by mouth. 1000 iu  daily    Yes Historical Provider, MD  citalopram (CELEXA) 20 MG tablet Take 20 mg by mouth daily.     Yes Historical Provider, MD  colchicine 0.6 MG tablet Take 0.6 mg by mouth as needed.     Yes Historical Provider, MD  ibandronate (BONIVA) 150 MG tablet Take 150 mg by mouth every 30 (thirty) days. Take in the morning with a full glass of water, on an empty stomach, and do not take anything else by mouth or lie down for the next 30 min.   Yes Historical Provider, MD  ULORIC 40 MG tablet  08/29/10  Yes Historical Provider, MD    Allergies:  No Known Allergies  Social History: Reports that he quit smoking about 59 years ago. His smoking use included Cigarettes.Marland Kitchen He reports that he does not drink alcohol or use illicit drugs.  He was married for many years got married in June 30, 1944 his wife died in 2023-01-01 of last year. He has 3 children one deceased he has 6 grandchildren. He worked on a farm prior to retiring. He quit tobacco in 06-30-48.   Family History: Family History  Problem Relation Age of Onset  .  Prostate cancer Father 33    died  . Pancreatic cancer Mother 24    died    Physical Exam: Filed Vitals:   08/16/11 0830 08/16/11 0930 08/16/11 1030 08/16/11 1130  BP:      Pulse: 76 76 74 75  Temp:      TempSrc:      Resp:      SpO2: 93% 96% 96% 97%   General: Patient appears younger than his stated age. HEENT: Head normocephalic atraumatic pupils reactive to light. Cardiovascular: Regular rate rhythm Lungs: Clear to auscultations bilaterally Abdomen: Ureteroileal conduit in place. Abdomen is soft nontender positive bowel sounds. Extremities: 1+ edema bilaterally.   Labs on Admission:    Basename 08/16/11 0800  NA 140  K 4.2  CL 100  CO2 25  GLUCOSE 118*  BUN 52*  CREATININE 4.18*  CALCIUM 8.8  MG --  PHOS --    Basename 08/16/11 0800  WBC 22.9*  NEUTROABS --  HGB 13.4  HCT 41.1  MCV 95.6  PLT 176     Radiological Exams on Admission: Ct Abdomen Pelvis Wo Contrast  08/16/2011  *RADIOLOGY REPORT*  Clinical Data: Left flank pain and history of bilateral renal calculi including prior right nephrolithotomy on 07/10/2010. History of bladder carcinoma with prior cystectomy and ileal conduit formation.  CT ABDOMEN AND PELVIS WITHOUT CONTRAST  Technique:  Multidetector CT imaging of the abdomen and pelvis was performed following the standard protocol without intravenous contrast.  Comparison: 05/09/2010  Findings: There is evidence of moderately severe left-sided hydronephrosis secondary to an obstructing calculus at the ureteropelvic junction measuring approximately 5 x 9 x 12 mm. Nonobstructing lower pole calculi are present measuring approximately 3 mm and 5 mm in respective greatest diameter.  No other left-sided calculi or calcifications in the ileal conduit are identified.  The ileal conduit is nondistended and shows no gross abnormalities at the level of a right lower quadrant ileostomy. There is a tiny nonobstructing calculus in the lower pole of the right kidney.  The right kidney is relatively atrophic compared to the left.  Stable renal cyst in the posterior right kidney.  Stable gallstones.  No abnormal fluid collections.  Other unenhanced solid organs and bowel are unremarkable.  Stable left inguinal hernia containing fat.  IMPRESSION: Moderately severe left hydronephrosis secondary to an obstructing calculus at the UPJ measuring 12 mm in greatest dimensions.  Other nonobstructing calculi are present bilaterally.  Original Report Authenticated By: Azzie Roup, M.D.    Assessment/Plan .Obstructive uropathy Patient presented with obstructive stone with  hydronephrosis and leukocytosis. Urology has been consulted as well as  interventional radiology. Patient was started on IV Rocephin. Will continue IV Rocephin. Patient will be made n.p.o. for procedure by interventional radiology. Will monitor kidney function hopefully kidney function will improve after the obstruction is removed. IV fluids.   .Acute renal failure As mentioned above acute renal failure most likely secondary to the obstructing stone will monitor kidney function.   .Leukocytosis Leukocytosis could be secondary to infection sole empirically cover with Rocephin IV.   Marland KitchenGout Patient with history of gout but no active flare at this time. Will monitor for symptoms.   .Aortic stenosis Based on records patient has a history of aortic stenosis. Will gently hydrate. No echo report available on file.   Hypertension Diet-controlled. Will monitor.  After discussion with the patient, he wishes to BE DO NOT RESUSCITATE.  We will respect these wishes.  Time spent on this patient including  examination and decision-making process: 60 minutes.  Noel Christmas [id number] 08/16/2011, 1:20 PM

## 2011-08-16 NOTE — ED Notes (Signed)
Pt returned from CT °

## 2011-08-16 NOTE — Progress Notes (Signed)
ANTIBIOTIC CONSULT NOTE - INITIAL  Pharmacy Consult for Vanco and Zosyn Indication: Sepsis (possible source: kidney stone)  No Known Allergies  Patient Measurements: Height: 5\' 8"  (172.7 cm) Weight: 163 lb 5.8 oz (74.1 kg) IBW/kg (Calculated) : 68.4   Vital Signs: Temp: 99.7 F (37.6 C) (05/24 1900) Temp src: Oral (05/24 1900) BP: 99/53 mmHg (05/24 1900) Pulse Rate: 75  (05/24 1900) Intake/Output from previous day:   Intake/Output from this shift:    Labs:  Basename 08/16/11 1613 08/16/11 0800  WBC 2.7* 22.9*  HGB 13.1 13.4  PLT 145* 176  LABCREA -- --  CREATININE -- 4.18*   Estimated Creatinine Clearance: 12 ml/min (by C-G formula based on Cr of 4.18). No results found for this basename: VANCOTROUGH:2,VANCOPEAK:2,VANCORANDOM:2,GENTTROUGH:2,GENTPEAK:2,GENTRANDOM:2,TOBRATROUGH:2,TOBRAPEAK:2,TOBRARND:2,AMIKACINPEAK:2,AMIKACINTROU:2,AMIKACIN:2, in the last 72 hours   Microbiology: No results found for this or any previous visit (from the past 720 hour(s)).  Medical History: Past Medical History  Diagnosis Date  . Hypertension   . Aortic valve stenosis   . Gout   . Hx of bladder cancer   . Deep venous thrombosis     hx of  . Prostate cancer   . Osteoporosis   . Renal failure   . Heart murmur   . Depression   . Chronic kidney disease   . Kidney stone     Medications:  Anti-infectives     Start     Dose/Rate Route Frequency Ordered Stop   08/17/11 1000   cefTRIAXone (ROCEPHIN) 1 g in dextrose 5 % 50 mL IVPB        1 g 100 mL/hr over 30 Minutes Intravenous Every 24 hours 08/16/11 1916     08/16/11 1700   vancomycin (VANCOCIN) IVPB 1000 mg/200 mL premix        1,000 mg 200 mL/hr over 60 Minutes Intravenous  Once 08/16/11 1644 08/16/11 1911   08/16/11 1700   piperacillin-tazobactam (ZOSYN) IVPB 3.375 g        3.375 g 12.5 mL/hr over 240 Minutes Intravenous  Once 08/16/11 1648     08/16/11 0945   cefTRIAXone (ROCEPHIN) 1 g in dextrose 5 % 50 mL IVPB       1 g 100 mL/hr over 30 Minutes Intravenous  Once 08/16/11 0930 08/16/11 1045         Assessment: 78 yo M admitted 08/16/11 with left UPJ obstructing stone with moderate to severe hydronephrosis, now s/p L perc tube. Pertinent PMH includes renal failure/CKD, bladder cancer, and kidney stones. Transferred to ICU with sepsis. SCr = 4.18, CrCl(n) ~ 12. Has received Vanco x1 at 18:00 and Zosyn 3.375g at 18:00 already tonight. Of note, pt does have a hx of bacteremia with Morganella morganii and Virdans strep which was resistant to Zosyn at that time (see culture results from 08/29/07).  Goal of Therapy:  Vancomycin trough level 15-20 mcg/ml  Plan:  1) D/C ceftriaxone 2) Zosyn 2.25g IV q8h - next due 5/25 at 02:00 3) Vanco 1g IV q48h - next due 5/26 at 18:00 4) Monitor renal function closely 5) F/U new cultures  Verdia Kuba, PharmD Pager: [phone] 08/16/2011,8:09 PM

## 2011-08-16 NOTE — Procedures (Signed)
Procedure:  Left percutaneous nephrostomy Findings:  Urine grossly infected, foul-smelling and bloody from left kidney.  Sample sent for culture.  10 Fr perc nephrostomy tube placed.  Draining to gravity.

## 2011-08-16 NOTE — ED Notes (Signed)
Called to give report, Delia Chimes will call back.

## 2011-08-16 NOTE — ED Notes (Signed)
Pt states "they tell me I have a cyst on my left kidney, have had kidney stones before, it hurts in my back"; pt indicates pain is left flank and into left lower abd.

## 2011-08-17 DIAGNOSIS — I359 Nonrheumatic aortic valve disorder, unspecified: Secondary | ICD-10-CM

## 2011-08-17 DIAGNOSIS — N179 Acute kidney failure, unspecified: Secondary | ICD-10-CM

## 2011-08-17 DIAGNOSIS — D7289 Other specified disorders of white blood cells: Secondary | ICD-10-CM

## 2011-08-17 DIAGNOSIS — N201 Calculus of ureter: Secondary | ICD-10-CM

## 2011-08-17 DIAGNOSIS — N3000 Acute cystitis without hematuria: Secondary | ICD-10-CM

## 2011-08-17 LAB — CBC
Hemoglobin: 11.7 g/dL — ABNORMAL LOW (ref 13.0–17.0)
MCH: 31.5 pg (ref 26.0–34.0)
MCHC: 33 g/dL (ref 30.0–36.0)
MCV: 95.7 fL (ref 78.0–100.0)

## 2011-08-17 LAB — OSMOLALITY, URINE: Osmolality, Ur: 375 mOsm/kg — ABNORMAL LOW (ref 390–1090)

## 2011-08-17 LAB — BASIC METABOLIC PANEL
BUN: 64 mg/dL — ABNORMAL HIGH (ref 6–23)
CO2: 23 mEq/L (ref 19–32)
GFR calc non Af Amer: 11 mL/min — ABNORMAL LOW (ref >90)
Glucose, Bld: 104 mg/dL — ABNORMAL HIGH (ref 70–99)
Potassium: 4.6 mEq/L (ref 3.5–5.1)

## 2011-08-17 NOTE — Progress Notes (Signed)
Patient ID: [name] [name], male   DOB: [birth year], 78 y.o.   MRN: [id number]    Subjective: [name] [name] has an obstructing left proximal ureteral stone with urosepsis and ARI.  He had a left perc tube placed yesterday and is feeling much better today.   He is afebrile and has good urine output.  His Cr remains elevated at 4.47 and his WBC jumped to 18.3, but he is afebrile and normotensive. ROS: Negative except as above.  He has minimal flank pain.  Objective: Vital signs in last 24 hours: Temp:  [97.4 F (36.3 C)-102.6 F (39.2 C)] 97.8 F (36.6 C) (05/25 0800) Pulse Rate:  [56-83] 75  (05/25 0400) Resp:  [18-32] 18  (05/25 0400) BP: (92-165)/(46-79) 106/53 mmHg (05/25 0400) SpO2:  [84 %-100 %] 98 % (05/25 0400) Weight:  [73.5 kg (162 lb 0.6 oz)-74.1 kg (163 lb 5.8 oz)] 73.5 kg (162 lb 0.6 oz) (05/25 0000)  Intake/Output from previous day: 05/24 0701 - 05/25 0700 In: 550 [I.V.:500; IV Piggyback:50] Out: [id number] [Urine:675] Intake/Output this shift:   Physical exam: Gen: WD, WN in NAD.  A/O x 3. Lungs: CTA. CV: RRR with systolic murmur. GI: Soft, flat, NT with + BS.    Lab Results:   [facility] 08/17/11 0341 08/16/11 1613  WBC 18.3* 2.7*  HGB 11.7* 13.1  HCT 35.5* 38.6*  PLT 136* 145*   BMET  Basename 08/17/11 0341 08/16/11 0800  NA 139 140  K 4.6 4.2  CL 103 100  CO2 23 25  GLUCOSE 104* 118*  BUN 64* 52*  CREATININE 4.47* 4.18*  CALCIUM 7.8* 8.8   PT/INR  Basename 08/16/11 1015  LABPROT 15.7*  INR 1.22   ABG  Basename 08/16/11 1643  PHART 7.458*  HCO3 19.4*    Studies/Results: Ct Abdomen Pelvis Wo Contrast  08/16/2011  *RADIOLOGY REPORT*  Clinical Data: Left flank pain and history of bilateral renal calculi including prior right nephrolithotomy on 07/10/2010. History of bladder carcinoma with prior cystectomy and ileal conduit formation.  CT ABDOMEN AND PELVIS WITHOUT CONTRAST  Technique:  Multidetector CT imaging of the abdomen and pelvis was  performed following the standard protocol without intravenous contrast.  Comparison: 05/09/2010  Findings: There is evidence of moderately severe left-sided hydronephrosis secondary to an obstructing calculus at the ureteropelvic junction measuring approximately 5 x 9 x 12 mm. Nonobstructing lower pole calculi are present measuring approximately 3 mm and 5 mm in respective greatest diameter.  No other left-sided calculi or calcifications in the ileal conduit are identified.  The ileal conduit is nondistended and shows no gross abnormalities at the level of a right lower quadrant ileostomy. There is a tiny nonobstructing calculus in the lower pole of the right kidney.  The right kidney is relatively atrophic compared to the left.  Stable renal cyst in the posterior right kidney.  Stable gallstones.  No abnormal fluid collections.  Other unenhanced solid organs and bowel are unremarkable.  Stable left inguinal hernia containing fat.  IMPRESSION: Moderately severe left hydronephrosis secondary to an obstructing calculus at the UPJ measuring 12 mm in greatest dimensions.  Other nonobstructing calculi are present bilaterally.  Original Report Authenticated By: Azzie Roup, M.D.   Dg Chest 1 View  08/16/2011  *RADIOLOGY REPORT*  Clinical Data: Dyspnea, shortness of breath, weakness, nephrostomy tube placed  CHEST - 1 VIEW  Comparison: 07/04/2010  Findings: Lungs are essentially clear.  Stable calcified granuloma in the lateral right upper lobe.  No pleural effusion  or pneumothorax.  The heart is top normal in size for inspiration.  Left subclavian pacemaker.  IMPRESSION: No evidence of acute cardiopulmonary disease.  Original Report Authenticated By: [name] Hy, M.D.   Ir Perc Nephrostomy Left  08/16/2011  *RADIOLOGY REPORT*  Clinical Data: Left renal obstruction due to UPJ calculus.  Status post prior cystectomy and ileal conduit formation.  Elevated white blood cell count.  1.  ULTRASOUND GUIDANCE FOR  PUNCTURE OF THE LEFT RENAL COLLECTING SYSTEM. 2.  LEFT PERCUTANEOUS NEPHROSTOMY TUBE PLACEMENT  Comparison:  CT of the abdomen pelvis performed earlier today.  Sedation: 1.0 mg IV Versed; 25 mcg IV Fentanyl.  Total Moderate Sedation Time: 81minutes.  Contrast:  10 ml Omnipaque 300  Fluoroscopy Time: 1.5 minutes.  Procedure:  The procedure, risks, benefits, and alternatives were explained to the patient.  Questions regarding the procedure were encouraged and answered.  The patient understands and consents to the procedure.  The left flank region was prepped with Betadine in a sterile fashion, and a sterile drape was applied covering the operative field.  A sterile gown and sterile gloves were used for the procedure. Local anesthesia was provided with 1% Lidocaine.  Ultrasound was used to localize the left kidney.  Under direct ultrasound guidance, a 21 gauge needle was advanced into the renal collecting system.  Ultrasound image documentation was performed. Aspiration of urine sample was performed followed by contrast injection.  A urine sample was sent for culture analysis.  A transitional dilator was advanced over a guidewire.  Percutaneous tract dilatation was then performed over the guidewire.  A 10- French percutaneous nephrostomy tube was then advanced and formed in the collecting system.  Catheter position was confirmed by fluoroscopy after contrast injection.  The catheter was secured at the skin with a Prolene retention suture and Stat-Lock device.  A gravity bag was placed.  Complications: None  Findings:  Ultrasound confirms moderately severe left hydronephrosis.  Aspiration yielded grossly purulent and bloody urine from the renal collecting system.  The 10-French nephrostomy tube was formed at the level of the renal pelvis.  Contrast injection confirms the presence of high-grade obstruction related to a calculus lodged at the UPJ and proximal ureter.  IMPRESSION:  Left percutaneous nephrostomy tube  placement as above.  Aspiration yielded grossly purulent and bloody fluid consistent with pyonephrosis.  A 10-French catheter was formed at the level of the renal pelvis.  Contrast injection confirms the presence of high- grade obstruction due to a UPJ calculus.  Original Report Authenticated By: Azzie Roup, M.D.   Ir US Guide Bx Asp/drain  08/16/2011  *RADIOLOGY REPORT*  Clinical Data: Left renal obstruction due to UPJ calculus.  Status post prior cystectomy and ileal conduit formation.  Elevated white blood cell count.  1.  ULTRASOUND GUIDANCE FOR PUNCTURE OF THE LEFT RENAL COLLECTING SYSTEM. 2.  LEFT PERCUTANEOUS NEPHROSTOMY TUBE PLACEMENT  Comparison:  CT of the abdomen pelvis performed earlier today.  Sedation: 1.0 mg IV Versed; 25 mcg IV Fentanyl.  Total Moderate Sedation Time: 48minutes.  Contrast:  10 ml Omnipaque 300  Fluoroscopy Time: 1.5 minutes.  Procedure:  The procedure, risks, benefits, and alternatives were explained to the patient.  Questions regarding the procedure were encouraged and answered.  The patient understands and consents to the procedure.  The left flank region was prepped with Betadine in a sterile fashion, and a sterile drape was applied covering the operative field.  A sterile gown and sterile gloves were used for the procedure.  Local anesthesia was provided with 1% Lidocaine.  Ultrasound was used to localize the left kidney.  Under direct ultrasound guidance, a 21 gauge needle was advanced into the renal collecting system.  Ultrasound image documentation was performed. Aspiration of urine sample was performed followed by contrast injection.  A urine sample was sent for culture analysis.  A transitional dilator was advanced over a guidewire.  Percutaneous tract dilatation was then performed over the guidewire.  A 10- French percutaneous nephrostomy tube was then advanced and formed in the collecting system.  Catheter position was confirmed by fluoroscopy after contrast  injection.  The catheter was secured at the skin with a Prolene retention suture and Stat-Lock device.  A gravity bag was placed.  Complications: None  Findings:  Ultrasound confirms moderately severe left hydronephrosis.  Aspiration yielded grossly purulent and bloody urine from the renal collecting system.  The 10-French nephrostomy tube was formed at the level of the renal pelvis.  Contrast injection confirms the presence of high-grade obstruction related to a calculus lodged at the UPJ and proximal ureter.  IMPRESSION:  Left percutaneous nephrostomy tube placement as above.  Aspiration yielded grossly purulent and bloody fluid consistent with pyonephrosis.  A 10-French catheter was formed at the level of the renal pelvis.  Contrast injection confirms the presence of high- grade obstruction due to a UPJ calculus.  Original Report Authenticated By: Azzie Roup, M.D.    Anti-infectives: Anti-infectives     Start     Dose/Rate Route Frequency Ordered Stop   08/18/11 1800   vancomycin (VANCOCIN) IVPB 1000 mg/200 mL premix        1,000 mg 200 mL/hr over 60 Minutes Intravenous Every 48 hours 08/16/11 2036     08/17/11 1000   cefTRIAXone (ROCEPHIN) 1 g in dextrose 5 % 50 mL IVPB  Status:  Discontinued        1 g 100 mL/hr over 30 Minutes Intravenous Every 24 hours 08/16/11 1916 08/16/11 2034   08/17/11 0200  piperacillin-tazobactam (ZOSYN) IVPB 2.25 g       2.25 g 100 mL/hr over 30 Minutes Intravenous Every 8 hours 08/16/11 2036     08/16/11 1700   vancomycin (VANCOCIN) IVPB 1000 mg/200 mL premix        1,000 mg 200 mL/hr over 60 Minutes Intravenous  Once 08/16/11 1644 08/16/11 1911   08/16/11 1700  piperacillin-tazobactam (ZOSYN) IVPB 3.375 g       3.375 g 12.5 mL/hr over 240 Minutes Intravenous  Once 08/16/11 1648 08/16/11 2211   08/16/11 0945   cefTRIAXone (ROCEPHIN) 1 g in dextrose 5 % 50 mL IVPB        1 g 100 mL/hr over 30 Minutes Intravenous  Once 08/16/11 0930 08/16/11 1045            Current Facility-Administered Medications  Medication Dose Route Frequency Provider Last Rate Last Dose  . 0.9 %  sodium chloride infusion   Intravenous Continuous Ernest Haber, MD 50 mL/hr at 08/17/11 0445    . 0.9 %  sodium chloride infusion   Intravenous Continuous Ernest Haber, MD 10 mL/hr at 08/16/11 1701 300 mL at 08/16/11 1701  . acetaminophen (TYLENOL) tablet 1,000 mg  1,000 mg Oral Once Hoy Morn, MD   1,000 mg at 08/16/11 1649  . acetaminophen (TYLENOL) tablet 650 mg  650 mg Oral Q6H PRN Ernest Haber, MD      . albuterol (PROVENTIL) (5 MG/ML) 0.5% nebulizer solution 2.5 mg  2.5 mg Nebulization Q2H PRN Ernest Haber, MD      . aspirin chewable tablet 81 mg  81 mg Oral Daily Ernest Haber, MD   81 mg at 08/16/11 2040  . cefTRIAXone (ROCEPHIN) 1 g in dextrose 5 % 50 mL IVPB  1 g Intravenous Once Hoy Morn, MD   1 g at 08/16/11 1015  . citalopram (CELEXA) tablet 20 mg  20 mg Oral Daily Ernest Haber, MD   20 mg at 08/16/11 2041  . colchicine tablet 0.6 mg  0.6 mg Oral PRN Ernest Haber, MD      . enoxaparin (LOVENOX) injection 30 mg  30 mg Subcutaneous Q24H Ernest Haber, MD   30 mg at 08/16/11 2043  . febuxostat (ULORIC) tablet 40 mg  40 mg Oral Daily Ernest Haber, MD   40 mg at 08/16/11 2042  . fentaNYL (SUBLIMAZE) injection   Intravenous PRN Azzie Roup, MD   25 mcg at 08/16/11 1532  . furosemide (LASIX) injection 20 mg  20 mg Intravenous Once Novlet Thea Alken, MD      . HYDROcodone-acetaminophen (NORCO) 5-325 MG per tablet 1-2 tablet  1-2 tablet Oral Q4H PRN Ernest Haber, MD      . HYDROmorphone (DILAUDID) injection 0.5 mg  0.5 mg Intravenous Q4H PRN Ernest Haber, MD      . ibuprofen (ADVIL,MOTRIN) tablet 600 mg  600 mg Oral Once Hoy Morn, MD   200 mg at 08/16/11 1648  . midazolam (VERSED) 5 MG/5ML injection   Intravenous PRN Azzie Roup, MD   1 mg at 08/16/11 1532  . piperacillin-tazobactam (ZOSYN) IVPB 2.25 g  2.25 g Intravenous Q8H  Samul Dada, PHARMD   2.25 g at 08/17/11 0130  . piperacillin-tazobactam (ZOSYN) IVPB 3.375 g  3.375 g Intravenous Once Hoy Morn, MD   3.375 g at 08/16/11 1811  . vancomycin (VANCOCIN) IVPB 1000 mg/200 mL premix  1,000 mg Intravenous Once Hoy Morn, MD   1,000 mg at 08/16/11 1811  . vancomycin (VANCOCIN) IVPB 1000 mg/200 mL premix  1,000 mg Intravenous Q48H Samul Dada, PHARMD      . DISCONTD: 0.9 %  sodium chloride infusion   Intravenous STAT Hoy Morn, MD      . DISCONTD: aspirin tablet 81 mg  81 mg Oral Daily Novlet Thea Alken, MD      . DISCONTD: cefTRIAXone (ROCEPHIN) 1 g in dextrose 5 % 50 mL IVPB  1 g Intravenous Q24H Novlet Thea Alken, MD        Assessment: Left proximal stone with urosepsis and ARI improving post percutaneous drainage.   Plan: Continue medical management. He will be scheduled for a left percutaneous nephrolithotomy when recovered from the sepsis.   LOS: 1 day    Kennice Finnie J 08/17/2011

## 2011-08-17 NOTE — Progress Notes (Signed)
Subjective: Patient is sitting up in bed with his son at the bedside. He feels much better today.  Objective: Weight change:   Intake/Output Summary (Last 24 hours) at 08/17/11 1247 Last data filed at 08/17/11 0900  Gross per 24 hour  Intake    900 ml  Output    750 ml  Net    150 ml    Filed Vitals:   08/17/11 0800  BP: 120/60  Pulse: 75  Temp: 97.8 F (36.6 C)  Resp: 13   General: Patient does not seem to be in any acute distress. Cardiovascular: Regular rate rhythm Lungs: Decreased breath sounds in the bases Abdomen: Soft nontender none the tended positive bowel sounds mild CVA tenderness on the left.  Extremities trace edema.*  Lab Results: Reviewed  Micro Results: Recent Results (from the past 240 hour(s))  MRSA PCR SCREENING     Status: Normal   Collection Time   08/16/11  7:54 PM      Component Value Range Status Comment   MRSA by PCR NEGATIVE  NEGATIVE  Final     Studies/Results: Ct Abdomen Pelvis Wo Contrast  08/16/2011  *RADIOLOGY REPORT*  Clinical Data: Left flank pain and history of bilateral renal calculi including prior right nephrolithotomy on 07/10/2010. History of bladder carcinoma with prior cystectomy and ileal conduit formation.  CT ABDOMEN AND PELVIS WITHOUT CONTRAST  Technique:  Multidetector CT imaging of the abdomen and pelvis was performed following the standard protocol without intravenous contrast.  Comparison: 05/09/2010  Findings: There is evidence of moderately severe left-sided hydronephrosis secondary to an obstructing calculus at the ureteropelvic junction measuring approximately 5 x 9 x 12 mm. Nonobstructing lower pole calculi are present measuring approximately 3 mm and 5 mm in respective greatest diameter.  No other left-sided calculi or calcifications in the ileal conduit are identified.  The ileal conduit is nondistended and shows no gross abnormalities at the level of a right lower quadrant ileostomy. There is a tiny nonobstructing  calculus in the lower pole of the right kidney.  The right kidney is relatively atrophic compared to the left.  Stable renal cyst in the posterior right kidney.  Stable gallstones.  No abnormal fluid collections.  Other unenhanced solid organs and bowel are unremarkable.  Stable left inguinal hernia containing fat.  IMPRESSION: Moderately severe left hydronephrosis secondary to an obstructing calculus at the UPJ measuring 12 mm in greatest dimensions.  Other nonobstructing calculi are present bilaterally.  Original Report Authenticated By: Azzie Roup, M.D.   Dg Chest 1 View  08/16/2011  *RADIOLOGY REPORT*  Clinical Data: Dyspnea, shortness of breath, weakness, nephrostomy tube placed  CHEST - 1 VIEW  Comparison: 07/04/2010  Findings: Lungs are essentially clear.  Stable calcified granuloma in the lateral right upper lobe.  No pleural effusion or pneumothorax.  The heart is top normal in size for inspiration.  Left subclavian pacemaker.  IMPRESSION: No evidence of acute cardiopulmonary disease.  Original Report Authenticated By: [name] Hy, M.D.   Ir Perc Nephrostomy Left  08/16/2011  *RADIOLOGY REPORT*  Clinical Data: Left renal obstruction due to UPJ calculus.  Status post prior cystectomy and ileal conduit formation.  Elevated white blood cell count.  1.  ULTRASOUND GUIDANCE FOR PUNCTURE OF THE LEFT RENAL COLLECTING SYSTEM. 2.  LEFT PERCUTANEOUS NEPHROSTOMY TUBE PLACEMENT  Comparison:  CT of the abdomen pelvis performed earlier today.  Sedation: 1.0 mg IV Versed; 25 mcg IV Fentanyl.  Total Moderate Sedation Time: 35minutes.  Contrast:  10  ml Omnipaque 300  Fluoroscopy Time: 1.5 minutes.  Procedure:  The procedure, risks, benefits, and alternatives were explained to the patient.  Questions regarding the procedure were encouraged and answered.  The patient understands and consents to the procedure.  The left flank region was prepped with Betadine in a sterile fashion, and a sterile drape was  applied covering the operative field.  A sterile gown and sterile gloves were used for the procedure. Local anesthesia was provided with 1% Lidocaine.  Ultrasound was used to localize the left kidney.  Under direct ultrasound guidance, a 21 gauge needle was advanced into the renal collecting system.  Ultrasound image documentation was performed. Aspiration of urine sample was performed followed by contrast injection.  A urine sample was sent for culture analysis.  A transitional dilator was advanced over a guidewire.  Percutaneous tract dilatation was then performed over the guidewire.  A 10- French percutaneous nephrostomy tube was then advanced and formed in the collecting system.  Catheter position was confirmed by fluoroscopy after contrast injection.  The catheter was secured at the skin with a Prolene retention suture and Stat-Lock device.  A gravity bag was placed.  Complications: None  Findings:  Ultrasound confirms moderately severe left hydronephrosis.  Aspiration yielded grossly purulent and bloody urine from the renal collecting system.  The 10-French nephrostomy tube was formed at the level of the renal pelvis.  Contrast injection confirms the presence of high-grade obstruction related to a calculus lodged at the UPJ and proximal ureter.  IMPRESSION:  Left percutaneous nephrostomy tube placement as above.  Aspiration yielded grossly purulent and bloody fluid consistent with pyonephrosis.  A 10-French catheter was formed at the level of the renal pelvis.  Contrast injection confirms the presence of high- grade obstruction due to a UPJ calculus.  Original Report Authenticated By: Azzie Roup, M.D.   Ir US Guide Bx Asp/drain  08/16/2011  *RADIOLOGY REPORT*  Clinical Data: Left renal obstruction due to UPJ calculus.  Status post prior cystectomy and ileal conduit formation.  Elevated white blood cell count.  1.  ULTRASOUND GUIDANCE FOR PUNCTURE OF THE LEFT RENAL COLLECTING SYSTEM. 2.  LEFT  PERCUTANEOUS NEPHROSTOMY TUBE PLACEMENT  Comparison:  CT of the abdomen pelvis performed earlier today.  Sedation: 1.0 mg IV Versed; 25 mcg IV Fentanyl.  Total Moderate Sedation Time: 81minutes.  Contrast:  10 ml Omnipaque 300  Fluoroscopy Time: 1.5 minutes.  Procedure:  The procedure, risks, benefits, and alternatives were explained to the patient.  Questions regarding the procedure were encouraged and answered.  The patient understands and consents to the procedure.  The left flank region was prepped with Betadine in a sterile fashion, and a sterile drape was applied covering the operative field.  A sterile gown and sterile gloves were used for the procedure. Local anesthesia was provided with 1% Lidocaine.  Ultrasound was used to localize the left kidney.  Under direct ultrasound guidance, a 21 gauge needle was advanced into the renal collecting system.  Ultrasound image documentation was performed. Aspiration of urine sample was performed followed by contrast injection.  A urine sample was sent for culture analysis.  A transitional dilator was advanced over a guidewire.  Percutaneous tract dilatation was then performed over the guidewire.  A 10- French percutaneous nephrostomy tube was then advanced and formed in the collecting system.  Catheter position was confirmed by fluoroscopy after contrast injection.  The catheter was secured at the skin with a Prolene retention suture and Stat-Lock device.  A gravity bag was placed.  Complications: None  Findings:  Ultrasound confirms moderately severe left hydronephrosis.  Aspiration yielded grossly purulent and bloody urine from the renal collecting system.  The 10-French nephrostomy tube was formed at the level of the renal pelvis.  Contrast injection confirms the presence of high-grade obstruction related to a calculus lodged at the UPJ and proximal ureter.  IMPRESSION:  Left percutaneous nephrostomy tube placement as above.  Aspiration yielded grossly purulent and  bloody fluid consistent with pyonephrosis.  A 10-French catheter was formed at the level of the renal pelvis.  Contrast injection confirms the presence of high- grade obstruction due to a UPJ calculus.  Original Report Authenticated By: Azzie Roup, M.D.   Medications: Scheduled Meds:   . acetaminophen  1,000 mg Oral Once  . aspirin  81 mg Oral Daily  . citalopram  20 mg Oral Daily  . enoxaparin  30 mg Subcutaneous Q24H  . febuxostat  40 mg Oral Daily  . furosemide  20 mg Intravenous Once  . ibuprofen  600 mg Oral Once  . piperacillin-tazobactam (ZOSYN)  IV  2.25 g Intravenous Q8H  . piperacillin-tazobactam (ZOSYN)  IV  3.375 g Intravenous Once  . vancomycin  1,000 mg Intravenous Once  . vancomycin  1,000 mg Intravenous Q48H  . DISCONTD: sodium chloride   Intravenous STAT  . DISCONTD: aspirin  81 mg Oral Daily  . DISCONTD: cefTRIAXone (ROCEPHIN)  IV  1 g Intravenous Q24H   Continuous Infusions:   . sodium chloride 50 mL/hr at 08/17/11 0445  . sodium chloride 300 mL (08/16/11 1701)   PRN Meds:.acetaminophen, albuterol, colchicine, fentaNYL, HYDROcodone-acetaminophen, HYDROmorphone (DILAUDID) injection, midazolam  Assessment/Plan: Hypertension (10/02/2010) Patient's blood pressure is stable. Will continue to monitor. Normally diet controlled.    Pacemaker (10/02/2010) Stable no events.    Obstructive uropathy (08/16/2011) Patient had nephrostomy tube placed secondary to obstructive stone per interventional radiology.  Acute renal failure (08/16/2011)/Cystitis Continue broad-spectrum antibiotics for the next 24 hours if cultures are all negative then can narrow spectrum back to Rocephin.   Leukocytosis (08/16/2011) Secondary to infection.     Gout (08/16/2011) When necessary meds ordered    ? Aortic stenosis (08/16/2011)    Discussed this with [facility] cardiology. Ordered  2-D echo.  DO NOT RESUSCITATE  LOS: 1 day   [name] [name] 08/17/2011, 12:47 PM Pager  [phone]

## 2011-08-17 NOTE — Progress Notes (Signed)
  Echocardiogram 2D Echocardiogram has been performed.  [name] [name] A 08/17/2011, 8:26 AM

## 2011-08-17 NOTE — Progress Notes (Signed)
Subjective: Pt ok. Feeling a little better today. Some soreness at (L)PCN site, but tolerable. Urology note reviewed  Objective: Physical Exam: BP 120/60  Pulse 75  Temp(Src) 97.8 F (36.6 C) (Oral)  Resp 13  Ht 5\' 8"  (1.727 m)  Wt 162 lb 0.6 oz (73.5 kg)  BMI 24.64 kg/m2  SpO2 99% (L)PCN site clean, mildly tender but no erythema.  400cc UOP yesterday. Has been emptied but not yet recorded this am.    Labs: CBC  Basename 08/17/11 0341 08/16/11 1613  WBC 18.3* 2.7*  HGB 11.7* 13.1  HCT 35.5* 38.6*  PLT 136* 145*   BMET  Basename 08/17/11 0341 08/16/11 0800  NA 139 140  K 4.6 4.2  CL 103 100  CO2 23 25  GLUCOSE 104* 118*  BUN 64* 52*  CREATININE 4.47* 4.18*  CALCIUM 7.8* 8.8   LFT No results found for this basename: PROT,ALBUMIN,AST,ALT,ALKPHOS,BILITOT,BILIDIR,IBILI,LIPASE in the last 72 hours PT/INR  Basename 08/16/11 1015  LABPROT 15.7*  INR 1.22     Studies/Results: Ct Abdomen Pelvis Wo Contrast  08/16/2011  *RADIOLOGY REPORT*  Clinical Data: Left flank pain and history of bilateral renal calculi including prior right nephrolithotomy on 07/10/2010. History of bladder carcinoma with prior cystectomy and ileal conduit formation.  CT ABDOMEN AND PELVIS WITHOUT CONTRAST  Technique:  Multidetector CT imaging of the abdomen and pelvis was performed following the standard protocol without intravenous contrast.  Comparison: 05/09/2010  Findings: There is evidence of moderately severe left-sided hydronephrosis secondary to an obstructing calculus at the ureteropelvic junction measuring approximately 5 x 9 x 12 mm. Nonobstructing lower pole calculi are present measuring approximately 3 mm and 5 mm in respective greatest diameter.  No other left-sided calculi or calcifications in the ileal conduit are identified.  The ileal conduit is nondistended and shows no gross abnormalities at the level of a right lower quadrant ileostomy. There is a tiny nonobstructing calculus in  the lower pole of the right kidney.  The right kidney is relatively atrophic compared to the left.  Stable renal cyst in the posterior right kidney.  Stable gallstones.  No abnormal fluid collections.  Other unenhanced solid organs and bowel are unremarkable.  Stable left inguinal hernia containing fat.  IMPRESSION: Moderately severe left hydronephrosis secondary to an obstructing calculus at the UPJ measuring 12 mm in greatest dimensions.  Other nonobstructing calculi are present bilaterally.  Original Report Authenticated By: Azzie Roup, M.D.   Dg Chest 1 View  08/16/2011  *RADIOLOGY REPORT*  Clinical Data: Dyspnea, shortness of breath, weakness, nephrostomy tube placed  CHEST - 1 VIEW  Comparison: 07/04/2010  Findings: Lungs are essentially clear.  Stable calcified granuloma in the lateral right upper lobe.  No pleural effusion or pneumothorax.  The heart is top normal in size for inspiration.  Left subclavian pacemaker.  IMPRESSION: No evidence of acute cardiopulmonary disease.  Original Report Authenticated By: [name] Hy, M.D.   Ir Perc Nephrostomy Left  08/16/2011  *RADIOLOGY REPORT*  Clinical Data: Left renal obstruction due to UPJ calculus.  Status post prior cystectomy and ileal conduit formation.  Elevated white blood cell count.  1.  ULTRASOUND GUIDANCE FOR PUNCTURE OF THE LEFT RENAL COLLECTING SYSTEM. 2.  LEFT PERCUTANEOUS NEPHROSTOMY TUBE PLACEMENT  Comparison:  CT of the abdomen pelvis performed earlier today.  Sedation: 1.0 mg IV Versed; 25 mcg IV Fentanyl.  Total Moderate Sedation Time: 70minutes.  Contrast:  10 ml Omnipaque 300  Fluoroscopy Time: 1.5 minutes.  Procedure:  The  procedure, risks, benefits, and alternatives were explained to the patient.  Questions regarding the procedure were encouraged and answered.  The patient understands and consents to the procedure.  The left flank region was prepped with Betadine in a sterile fashion, and a sterile drape was applied covering  the operative field.  A sterile gown and sterile gloves were used for the procedure. Local anesthesia was provided with 1% Lidocaine.  Ultrasound was used to localize the left kidney.  Under direct ultrasound guidance, a 21 gauge needle was advanced into the renal collecting system.  Ultrasound image documentation was performed. Aspiration of urine sample was performed followed by contrast injection.  A urine sample was sent for culture analysis.  A transitional dilator was advanced over a guidewire.  Percutaneous tract dilatation was then performed over the guidewire.  A 10- French percutaneous nephrostomy tube was then advanced and formed in the collecting system.  Catheter position was confirmed by fluoroscopy after contrast injection.  The catheter was secured at the skin with a Prolene retention suture and Stat-Lock device.  A gravity bag was placed.  Complications: None  Findings:  Ultrasound confirms moderately severe left hydronephrosis.  Aspiration yielded grossly purulent and bloody urine from the renal collecting system.  The 10-French nephrostomy tube was formed at the level of the renal pelvis.  Contrast injection confirms the presence of high-grade obstruction related to a calculus lodged at the UPJ and proximal ureter.  IMPRESSION:  Left percutaneous nephrostomy tube placement as above.  Aspiration yielded grossly purulent and bloody fluid consistent with pyonephrosis.  A 10-French catheter was formed at the level of the renal pelvis.  Contrast injection confirms the presence of high- grade obstruction due to a UPJ calculus.  Original Report Authenticated By: Azzie Roup, M.D.   Ir US Guide Bx Asp/drain  08/16/2011  *RADIOLOGY REPORT*  Clinical Data: Left renal obstruction due to UPJ calculus.  Status post prior cystectomy and ileal conduit formation.  Elevated white blood cell count.  1.  ULTRASOUND GUIDANCE FOR PUNCTURE OF THE LEFT RENAL COLLECTING SYSTEM. 2.  LEFT PERCUTANEOUS NEPHROSTOMY  TUBE PLACEMENT  Comparison:  CT of the abdomen pelvis performed earlier today.  Sedation: 1.0 mg IV Versed; 25 mcg IV Fentanyl.  Total Moderate Sedation Time: 102minutes.  Contrast:  10 ml Omnipaque 300  Fluoroscopy Time: 1.5 minutes.  Procedure:  The procedure, risks, benefits, and alternatives were explained to the patient.  Questions regarding the procedure were encouraged and answered.  The patient understands and consents to the procedure.  The left flank region was prepped with Betadine in a sterile fashion, and a sterile drape was applied covering the operative field.  A sterile gown and sterile gloves were used for the procedure. Local anesthesia was provided with 1% Lidocaine.  Ultrasound was used to localize the left kidney.  Under direct ultrasound guidance, a 21 gauge needle was advanced into the renal collecting system.  Ultrasound image documentation was performed. Aspiration of urine sample was performed followed by contrast injection.  A urine sample was sent for culture analysis.  A transitional dilator was advanced over a guidewire.  Percutaneous tract dilatation was then performed over the guidewire.  A 10- French percutaneous nephrostomy tube was then advanced and formed in the collecting system.  Catheter position was confirmed by fluoroscopy after contrast injection.  The catheter was secured at the skin with a Prolene retention suture and Stat-Lock device.  A gravity bag was placed.  Complications: None  Findings:  Ultrasound  confirms moderately severe left hydronephrosis.  Aspiration yielded grossly purulent and bloody urine from the renal collecting system.  The 10-French nephrostomy tube was formed at the level of the renal pelvis.  Contrast injection confirms the presence of high-grade obstruction related to a calculus lodged at the UPJ and proximal ureter.  IMPRESSION:  Left percutaneous nephrostomy tube placement as above.  Aspiration yielded grossly purulent and bloody fluid consistent  with pyonephrosis.  A 10-French catheter was formed at the level of the renal pelvis.  Contrast injection confirms the presence of high- grade obstruction due to a UPJ calculus.  Original Report Authenticated By: Azzie Roup, M.D.    Assessment/Plan: Obstructing prox stone with hydronephrosis. S/p (L)PCN 5/24 Per urology note, planning for perc nephrolithotomy when sepsis resolved. Will cont to follow.    LOS: 1 day    Ascencion Dike PA-C 08/17/2011 12:23 PM

## 2011-08-18 LAB — URINE CULTURE
Colony Count: NO GROWTH
Culture  Setup Time: 201305251733
Special Requests: NORMAL

## 2011-08-18 LAB — COMPREHENSIVE METABOLIC PANEL
Albumin: 2.4 g/dL — ABNORMAL LOW (ref 3.5–5.2)
Alkaline Phosphatase: 78 U/L (ref 39–117)
BUN: 60 mg/dL — ABNORMAL HIGH (ref 6–23)
CO2: 22 mEq/L (ref 19–32)
Chloride: 107 mEq/L (ref 96–112)
Creatinine, Ser: 3.25 mg/dL — ABNORMAL HIGH (ref 0.50–1.35)
GFR calc non Af Amer: 16 mL/min — ABNORMAL LOW (ref >90)
Glucose, Bld: 93 mg/dL (ref 70–99)
Potassium: 3.8 mEq/L (ref 3.5–5.1)
Total Bilirubin: 0.3 mg/dL (ref 0.3–1.2)

## 2011-08-18 LAB — CBC
HCT: 34.7 % — ABNORMAL LOW (ref 39.0–52.0)
Hemoglobin: 11.7 g/dL — ABNORMAL LOW (ref 13.0–17.0)
MCV: 93.8 fL (ref 78.0–100.0)
RBC: 3.7 MIL/uL — ABNORMAL LOW (ref 4.22–5.81)
WBC: 11.5 10*3/uL — ABNORMAL HIGH (ref 4.0–10.5)

## 2011-08-18 MED ORDER — HYDRALAZINE HCL 20 MG/ML IJ SOLN
10.0000 mg | INTRAMUSCULAR | Status: DC | PRN
  Administered 2011-08-18 – 2011-08-20 (×4): 10 mg via INTRAVENOUS
  Filled 2011-08-18 (×5): qty 1

## 2011-08-18 NOTE — Progress Notes (Signed)
Patient ID: [name] [name], male   DOB: March 14, 1922, 78 y.o.   MRN: [id number]   Subjective: Patient reports no significant problems overnight. He denies any pain. Nephrostomy tube is draining clear urine at this time. He has remained afebrile. Renal function is improving.  Objective: Vital signs in last 24 hours: Temp:  [97.4 F (36.3 C)-98.8 F (37.1 C)] 98.4 F (36.9 C) (05/26 0527) Pulse Rate:  [74-76] 76  (05/26 0527) Resp:  [16-20] 20  (05/26 0527) BP: (110-153)/(54-81) 153/81 mmHg (05/26 0527) SpO2:  [94 %-99 %] 94 % (05/26 0527)  Intake/Output from previous day: 05/25 0701 - 05/26 0700 In: 570 [I.V.:350] Out: 1965 [Urine:1965] Intake/Output this shift: Total I/O In: 360 [P.O.:360] Out: 200 [Urine:200]  Physical Exam:  Constitutional: Vital signs reviewed. WD WN in NAD   Pulmonary/Chest: Normal effort Abdominal: Soft. Non-tender, non-distended, bowel sounds are normal, no masses, organomegaly, or guarding present.  Genitourinary: Not examined Extremities: No cyanosis or edema   Lab Results:  Basename 08/18/11 0506 08/17/11 0341 08/16/11 1613  HGB 11.7* 11.7* 13.1  HCT 34.7* 35.5* 38.6*   BMET  Basename 08/18/11 0506 08/17/11 0341  NA 139 139  K 3.8 4.6  CL 107 103  CO2 22 23  GLUCOSE 93 104*  BUN 60* 64*  CREATININE 3.25* 4.47*  CALCIUM 7.5* 7.8*    Basename 08/16/11 1015  LABPT --  INR 1.22   No results found for this basename: LABURIN:1 in the last 72 hours Results for orders placed during the hospital encounter of 08/16/11  MRSA PCR SCREENING     Status: Normal   Collection Time   08/16/11  7:54 PM      Component Value Range Status Comment   MRSA by PCR NEGATIVE  NEGATIVE  Final     Studies/Results: Ct Abdomen Pelvis Wo Contrast  08/16/2011  *RADIOLOGY REPORT*  Clinical Data: Left flank pain and history of bilateral renal calculi including prior right nephrolithotomy on 07/10/2010. History of bladder carcinoma with prior cystectomy and ileal  conduit formation.  CT ABDOMEN AND PELVIS WITHOUT CONTRAST  Technique:  Multidetector CT imaging of the abdomen and pelvis was performed following the standard protocol without intravenous contrast.  Comparison: 05/09/2010  Findings: There is evidence of moderately severe left-sided hydronephrosis secondary to an obstructing calculus at the ureteropelvic junction measuring approximately 5 x 9 x 12 mm. Nonobstructing lower pole calculi are present measuring approximately 3 mm and 5 mm in respective greatest diameter.  No other left-sided calculi or calcifications in the ileal conduit are identified.  The ileal conduit is nondistended and shows no gross abnormalities at the level of a right lower quadrant ileostomy. There is a tiny nonobstructing calculus in the lower pole of the right kidney.  The right kidney is relatively atrophic compared to the left.  Stable renal cyst in the posterior right kidney.  Stable gallstones.  No abnormal fluid collections.  Other unenhanced solid organs and bowel are unremarkable.  Stable left inguinal hernia containing fat.  IMPRESSION: Moderately severe left hydronephrosis secondary to an obstructing calculus at the UPJ measuring 12 mm in greatest dimensions.  Other nonobstructing calculi are present bilaterally.  Original Report Authenticated By: Azzie Roup, M.D.   Dg Chest 1 View  08/16/2011  *RADIOLOGY REPORT*  Clinical Data: Dyspnea, shortness of breath, weakness, nephrostomy tube placed  CHEST - 1 VIEW  Comparison: 07/04/2010  Findings: Lungs are essentially clear.  Stable calcified granuloma in the lateral right upper lobe.  No pleural  effusion or pneumothorax.  The heart is top normal in size for inspiration.  Left subclavian pacemaker.  IMPRESSION: No evidence of acute cardiopulmonary disease.  Original Report Authenticated By: [name] Hy, M.D.   Ir Perc Nephrostomy Left  08/16/2011  *RADIOLOGY REPORT*  Clinical Data: Left renal obstruction due to UPJ  calculus.  Status post prior cystectomy and ileal conduit formation.  Elevated white blood cell count.  1.  ULTRASOUND GUIDANCE FOR PUNCTURE OF THE LEFT RENAL COLLECTING SYSTEM. 2.  LEFT PERCUTANEOUS NEPHROSTOMY TUBE PLACEMENT  Comparison:  CT of the abdomen pelvis performed earlier today.  Sedation: 1.0 mg IV Versed; 25 mcg IV Fentanyl.  Total Moderate Sedation Time: 78minutes.  Contrast:  10 ml Omnipaque 300  Fluoroscopy Time: 1.5 minutes.  Procedure:  The procedure, risks, benefits, and alternatives were explained to the patient.  Questions regarding the procedure were encouraged and answered.  The patient understands and consents to the procedure.  The left flank region was prepped with Betadine in a sterile fashion, and a sterile drape was applied covering the operative field.  A sterile gown and sterile gloves were used for the procedure. Local anesthesia was provided with 1% Lidocaine.  Ultrasound was used to localize the left kidney.  Under direct ultrasound guidance, a 21 gauge needle was advanced into the renal collecting system.  Ultrasound image documentation was performed. Aspiration of urine sample was performed followed by contrast injection.  A urine sample was sent for culture analysis.  A transitional dilator was advanced over a guidewire.  Percutaneous tract dilatation was then performed over the guidewire.  A 10- French percutaneous nephrostomy tube was then advanced and formed in the collecting system.  Catheter position was confirmed by fluoroscopy after contrast injection.  The catheter was secured at the skin with a Prolene retention suture and Stat-Lock device.  A gravity bag was placed.  Complications: None  Findings:  Ultrasound confirms moderately severe left hydronephrosis.  Aspiration yielded grossly purulent and bloody urine from the renal collecting system.  The 10-French nephrostomy tube was formed at the level of the renal pelvis.  Contrast injection confirms the presence of  high-grade obstruction related to a calculus lodged at the UPJ and proximal ureter.  IMPRESSION:  Left percutaneous nephrostomy tube placement as above.  Aspiration yielded grossly purulent and bloody fluid consistent with pyonephrosis.  A 10-French catheter was formed at the level of the renal pelvis.  Contrast injection confirms the presence of high- grade obstruction due to a UPJ calculus.  Original Report Authenticated By: Azzie Roup, M.D.   Ir US Guide Bx Asp/drain  08/16/2011  *RADIOLOGY REPORT*  Clinical Data: Left renal obstruction due to UPJ calculus.  Status post prior cystectomy and ileal conduit formation.  Elevated white blood cell count.  1.  ULTRASOUND GUIDANCE FOR PUNCTURE OF THE LEFT RENAL COLLECTING SYSTEM. 2.  LEFT PERCUTANEOUS NEPHROSTOMY TUBE PLACEMENT  Comparison:  CT of the abdomen pelvis performed earlier today.  Sedation: 1.0 mg IV Versed; 25 mcg IV Fentanyl.  Total Moderate Sedation Time: 52minutes.  Contrast:  10 ml Omnipaque 300  Fluoroscopy Time: 1.5 minutes.  Procedure:  The procedure, risks, benefits, and alternatives were explained to the patient.  Questions regarding the procedure were encouraged and answered.  The patient understands and consents to the procedure.  The left flank region was prepped with Betadine in a sterile fashion, and a sterile drape was applied covering the operative field.  A sterile gown and sterile gloves were used for the  procedure. Local anesthesia was provided with 1% Lidocaine.  Ultrasound was used to localize the left kidney.  Under direct ultrasound guidance, a 21 gauge needle was advanced into the renal collecting system.  Ultrasound image documentation was performed. Aspiration of urine sample was performed followed by contrast injection.  A urine sample was sent for culture analysis.  A transitional dilator was advanced over a guidewire.  Percutaneous tract dilatation was then performed over the guidewire.  A 10- French percutaneous  nephrostomy tube was then advanced and formed in the collecting system.  Catheter position was confirmed by fluoroscopy after contrast injection.  The catheter was secured at the skin with a Prolene retention suture and Stat-Lock device.  A gravity bag was placed.  Complications: None  Findings:  Ultrasound confirms moderately severe left hydronephrosis.  Aspiration yielded grossly purulent and bloody urine from the renal collecting system.  The 10-French nephrostomy tube was formed at the level of the renal pelvis.  Contrast injection confirms the presence of high-grade obstruction related to a calculus lodged at the UPJ and proximal ureter.  IMPRESSION:  Left percutaneous nephrostomy tube placement as above.  Aspiration yielded grossly purulent and bloody fluid consistent with pyonephrosis.  A 10-French catheter was formed at the level of the renal pelvis.  Contrast injection confirms the presence of high- grade obstruction due to a UPJ calculus.  Original Report Authenticated By: Azzie Roup, M.D.    Assessment/Plan:   Clinically improved. Patient will subsequently need a percutaneous definitive stone procedure versus internalization of the nephrostomy tube to a JJ stent and lithotripsy. That decision will be made by Dr. Jeffie Pollock.     LOS: 2 days   Vyncent Overby S 08/18/2011, 8:20 AM

## 2011-08-18 NOTE — Progress Notes (Signed)
Patient states he didn't sleep very well last night, states he wasn't sleepy.  Requesting something for dry nose.

## 2011-08-18 NOTE — Progress Notes (Signed)
Subjective: Pt ok. Urology note reviewed  Objective: Physical Exam: BP 153/81  Pulse 76  Temp(Src) 98.4 F (36.9 C) (Oral)  Resp 20  Ht 5\' 8"  (1.727 m)  Wt 162 lb 0.6 oz (73.5 kg)  BMI 24.64 kg/m2  SpO2 94% (L)PCN site clean, mildly tender but no erythema.  1100cc UOP yesterday. Clear urine in bag.    Labs: CBC  Basename 08/18/11 0506 08/17/11 0341  WBC 11.5* 18.3*  HGB 11.7* 11.7*  HCT 34.7* 35.5*  PLT 153 136*   BMET  Basename 08/18/11 0506 08/17/11 0341  NA 139 139  K 3.8 4.6  CL 107 103  CO2 22 23  GLUCOSE 93 104*  BUN 60* 64*  CREATININE 3.25* 4.47*  CALCIUM 7.5* 7.8*   LFT  Basename 08/18/11 0506  PROT 6.2  ALBUMIN 2.4*  AST 61*  ALT 33  ALKPHOS 78  BILITOT 0.3  BILIDIR --  IBILI --  LIPASE --   PT/INR  Basename 08/16/11 1015  LABPROT 15.7*  INR 1.22     Studies/Results: Dg Chest 1 View  08/16/2011  *RADIOLOGY REPORT*  Clinical Data: Dyspnea, shortness of breath, weakness, nephrostomy tube placed  CHEST - 1 VIEW  Comparison: 07/04/2010  Findings: Lungs are essentially clear.  Stable calcified granuloma in the lateral right upper lobe.  No pleural effusion or pneumothorax.  The heart is top normal in size for inspiration.  Left subclavian pacemaker.  IMPRESSION: No evidence of acute cardiopulmonary disease.  Original Report Authenticated By: [name] Hy, M.D.   Ir Perc Nephrostomy Left  08/16/2011  *RADIOLOGY REPORT*  Clinical Data: Left renal obstruction due to UPJ calculus.  Status post prior cystectomy and ileal conduit formation.  Elevated white blood cell count.  1.  ULTRASOUND GUIDANCE FOR PUNCTURE OF THE LEFT RENAL COLLECTING SYSTEM. 2.  LEFT PERCUTANEOUS NEPHROSTOMY TUBE PLACEMENT  Comparison:  CT of the abdomen pelvis performed earlier today.  Sedation: 1.0 mg IV Versed; 25 mcg IV Fentanyl.  Total Moderate Sedation Time: 30minutes.  Contrast:  10 ml Omnipaque 300  Fluoroscopy Time: 1.5 minutes.  Procedure:  The procedure, risks,  benefits, and alternatives were explained to the patient.  Questions regarding the procedure were encouraged and answered.  The patient understands and consents to the procedure.  The left flank region was prepped with Betadine in a sterile fashion, and a sterile drape was applied covering the operative field.  A sterile gown and sterile gloves were used for the procedure. Local anesthesia was provided with 1% Lidocaine.  Ultrasound was used to localize the left kidney.  Under direct ultrasound guidance, a 21 gauge needle was advanced into the renal collecting system.  Ultrasound image documentation was performed. Aspiration of urine sample was performed followed by contrast injection.  A urine sample was sent for culture analysis.  A transitional dilator was advanced over a guidewire.  Percutaneous tract dilatation was then performed over the guidewire.  A 10- French percutaneous nephrostomy tube was then advanced and formed in the collecting system.  Catheter position was confirmed by fluoroscopy after contrast injection.  The catheter was secured at the skin with a Prolene retention suture and Stat-Lock device.  A gravity bag was placed.  Complications: None  Findings:  Ultrasound confirms moderately severe left hydronephrosis.  Aspiration yielded grossly purulent and bloody urine from the renal collecting system.  The 10-French nephrostomy tube was formed at the level of the renal pelvis.  Contrast injection confirms the presence of high-grade obstruction related to a  calculus lodged at the UPJ and proximal ureter.  IMPRESSION:  Left percutaneous nephrostomy tube placement as above.  Aspiration yielded grossly purulent and bloody fluid consistent with pyonephrosis.  A 10-French catheter was formed at the level of the renal pelvis.  Contrast injection confirms the presence of high- grade obstruction due to a UPJ calculus.  Original Report Authenticated By: Azzie Roup, M.D.   Ir US Guide Bx  Asp/drain  08/16/2011  *RADIOLOGY REPORT*  Clinical Data: Left renal obstruction due to UPJ calculus.  Status post prior cystectomy and ileal conduit formation.  Elevated white blood cell count.  1.  ULTRASOUND GUIDANCE FOR PUNCTURE OF THE LEFT RENAL COLLECTING SYSTEM. 2.  LEFT PERCUTANEOUS NEPHROSTOMY TUBE PLACEMENT  Comparison:  CT of the abdomen pelvis performed earlier today.  Sedation: 1.0 mg IV Versed; 25 mcg IV Fentanyl.  Total Moderate Sedation Time: 79minutes.  Contrast:  10 ml Omnipaque 300  Fluoroscopy Time: 1.5 minutes.  Procedure:  The procedure, risks, benefits, and alternatives were explained to the patient.  Questions regarding the procedure were encouraged and answered.  The patient understands and consents to the procedure.  The left flank region was prepped with Betadine in a sterile fashion, and a sterile drape was applied covering the operative field.  A sterile gown and sterile gloves were used for the procedure. Local anesthesia was provided with 1% Lidocaine.  Ultrasound was used to localize the left kidney.  Under direct ultrasound guidance, a 21 gauge needle was advanced into the renal collecting system.  Ultrasound image documentation was performed. Aspiration of urine sample was performed followed by contrast injection.  A urine sample was sent for culture analysis.  A transitional dilator was advanced over a guidewire.  Percutaneous tract dilatation was then performed over the guidewire.  A 10- French percutaneous nephrostomy tube was then advanced and formed in the collecting system.  Catheter position was confirmed by fluoroscopy after contrast injection.  The catheter was secured at the skin with a Prolene retention suture and Stat-Lock device.  A gravity bag was placed.  Complications: None  Findings:  Ultrasound confirms moderately severe left hydronephrosis.  Aspiration yielded grossly purulent and bloody urine from the renal collecting system.  The 10-French nephrostomy tube was  formed at the level of the renal pelvis.  Contrast injection confirms the presence of high-grade obstruction related to a calculus lodged at the UPJ and proximal ureter.  IMPRESSION:  Left percutaneous nephrostomy tube placement as above.  Aspiration yielded grossly purulent and bloody fluid consistent with pyonephrosis.  A 10-French catheter was formed at the level of the renal pelvis.  Contrast injection confirms the presence of high- grade obstruction due to a UPJ calculus.  Original Report Authenticated By: Azzie Roup, M.D.    Assessment/Plan: Obstructing prox stone with hydronephrosis. S/p (L)PCN 5/24 WBC down, Cr down Will cont to follow.    LOS: 2 days    Ascencion Dike PA-C 08/18/2011 10:28 AM

## 2011-08-18 NOTE — Progress Notes (Signed)
Patient complaint of headache.  Administered tylenol.  Administered hydralazine per order parameters.

## 2011-08-18 NOTE — Progress Notes (Signed)
Subjective: Patient is sitting up in bed with his son at the bedside. He feels great today.  Objective: Weight change:   Intake/Output Summary (Last 24 hours) at 08/17/11 1247 Last data filed at 08/17/11 0900  Gross per 24 hour  Intake    900 ml  Output    750 ml  Net    150 ml    Filed Vitals:   08/17/11 0800  BP: 120/60  Pulse: 75  Temp: 97.8 F (36.6 C)  Resp: 13   General: Patient does not seem to be in any acute distress. Cardiovascular: Regular rate rhythm Lungs: Decreased breath sounds in the bases Abdomen: Soft nontender none the tended positive bowel sounds mild CVA tenderness on the left.  Extremities trace edema.  Lab Results: Reviewed  Micro Results: Recent Results (from the past 240 hour(s))  MRSA PCR SCREENING     Status: Normal   Collection Time   08/16/11  7:54 PM      Component Value Range Status Comment   MRSA by PCR NEGATIVE  NEGATIVE  Final     Studies/Results: Ct Abdomen Pelvis Wo Contrast  08/16/2011  *RADIOLOGY REPORT*  Clinical Data: Left flank pain and history of bilateral renal calculi including prior right nephrolithotomy on 07/10/2010. History of bladder carcinoma with prior cystectomy and ileal conduit formation.  CT ABDOMEN AND PELVIS WITHOUT CONTRAST  Technique:  Multidetector CT imaging of the abdomen and pelvis was performed following the standard protocol without intravenous contrast.  Comparison: 05/09/2010  Findings: There is evidence of moderately severe left-sided hydronephrosis secondary to an obstructing calculus at the ureteropelvic junction measuring approximately 5 x 9 x 12 mm. Nonobstructing lower pole calculi are present measuring approximately 3 mm and 5 mm in respective greatest diameter.  No other left-sided calculi or calcifications in the ileal conduit are identified.  The ileal conduit is nondistended and shows no gross abnormalities at the level of a right lower quadrant ileostomy. There is a tiny nonobstructing calculus in  the lower pole of the right kidney.  The right kidney is relatively atrophic compared to the left.  Stable renal cyst in the posterior right kidney.  Stable gallstones.  No abnormal fluid collections.  Other unenhanced solid organs and bowel are unremarkable.  Stable left inguinal hernia containing fat.  IMPRESSION: Moderately severe left hydronephrosis secondary to an obstructing calculus at the UPJ measuring 12 mm in greatest dimensions.  Other nonobstructing calculi are present bilaterally.  Original Report Authenticated By: Azzie Roup, M.D.   Dg Chest 1 View  08/16/2011  *RADIOLOGY REPORT*  Clinical Data: Dyspnea, shortness of breath, weakness, nephrostomy tube placed  CHEST - 1 VIEW  Comparison: 07/04/2010  Findings: Lungs are essentially clear.  Stable calcified granuloma in the lateral right upper lobe.  No pleural effusion or pneumothorax.  The heart is top normal in size for inspiration.  Left subclavian pacemaker.  IMPRESSION: No evidence of acute cardiopulmonary disease.  Original Report Authenticated By: [name] Hy, M.D.   Ir Perc Nephrostomy Left  08/16/2011  *RADIOLOGY REPORT*  Clinical Data: Left renal obstruction due to UPJ calculus.  Status post prior cystectomy and ileal conduit formation.  Elevated white blood cell count.  1.  ULTRASOUND GUIDANCE FOR PUNCTURE OF THE LEFT RENAL COLLECTING SYSTEM. 2.  LEFT PERCUTANEOUS NEPHROSTOMY TUBE PLACEMENT  Comparison:  CT of the abdomen pelvis performed earlier today.  Sedation: 1.0 mg IV Versed; 25 mcg IV Fentanyl.  Total Moderate Sedation Time: 47minutes.  Contrast:  10 ml  Omnipaque 300  Fluoroscopy Time: 1.5 minutes.  Procedure:  The procedure, risks, benefits, and alternatives were explained to the patient.  Questions regarding the procedure were encouraged and answered.  The patient understands and consents to the procedure.  The left flank region was prepped with Betadine in a sterile fashion, and a sterile drape was applied covering  the operative field.  A sterile gown and sterile gloves were used for the procedure. Local anesthesia was provided with 1% Lidocaine.  Ultrasound was used to localize the left kidney.  Under direct ultrasound guidance, a 21 gauge needle was advanced into the renal collecting system.  Ultrasound image documentation was performed. Aspiration of urine sample was performed followed by contrast injection.  A urine sample was sent for culture analysis.  A transitional dilator was advanced over a guidewire.  Percutaneous tract dilatation was then performed over the guidewire.  A 10- French percutaneous nephrostomy tube was then advanced and formed in the collecting system.  Catheter position was confirmed by fluoroscopy after contrast injection.  The catheter was secured at the skin with a Prolene retention suture and Stat-Lock device.  A gravity bag was placed.  Complications: None  Findings:  Ultrasound confirms moderately severe left hydronephrosis.  Aspiration yielded grossly purulent and bloody urine from the renal collecting system.  The 10-French nephrostomy tube was formed at the level of the renal pelvis.  Contrast injection confirms the presence of high-grade obstruction related to a calculus lodged at the UPJ and proximal ureter.  IMPRESSION:  Left percutaneous nephrostomy tube placement as above.  Aspiration yielded grossly purulent and bloody fluid consistent with pyonephrosis.  A 10-French catheter was formed at the level of the renal pelvis.  Contrast injection confirms the presence of high- grade obstruction due to a UPJ calculus.  Original Report Authenticated By: Azzie Roup, M.D.   Ir US Guide Bx Asp/drain  08/16/2011  *RADIOLOGY REPORT*  Clinical Data: Left renal obstruction due to UPJ calculus.  Status post prior cystectomy and ileal conduit formation.  Elevated white blood cell count.  1.  ULTRASOUND GUIDANCE FOR PUNCTURE OF THE LEFT RENAL COLLECTING SYSTEM. 2.  LEFT PERCUTANEOUS NEPHROSTOMY  TUBE PLACEMENT  Comparison:  CT of the abdomen pelvis performed earlier today.  Sedation: 1.0 mg IV Versed; 25 mcg IV Fentanyl.  Total Moderate Sedation Time: 24minutes.  Contrast:  10 ml Omnipaque 300  Fluoroscopy Time: 1.5 minutes.  Procedure:  The procedure, risks, benefits, and alternatives were explained to the patient.  Questions regarding the procedure were encouraged and answered.  The patient understands and consents to the procedure.  The left flank region was prepped with Betadine in a sterile fashion, and a sterile drape was applied covering the operative field.  A sterile gown and sterile gloves were used for the procedure. Local anesthesia was provided with 1% Lidocaine.  Ultrasound was used to localize the left kidney.  Under direct ultrasound guidance, a 21 gauge needle was advanced into the renal collecting system.  Ultrasound image documentation was performed. Aspiration of urine sample was performed followed by contrast injection.  A urine sample was sent for culture analysis.  A transitional dilator was advanced over a guidewire.  Percutaneous tract dilatation was then performed over the guidewire.  A 10- French percutaneous nephrostomy tube was then advanced and formed in the collecting system.  Catheter position was confirmed by fluoroscopy after contrast injection.  The catheter was secured at the skin with a Prolene retention suture and Stat-Lock device.  A  gravity bag was placed.  Complications: None  Findings:  Ultrasound confirms moderately severe left hydronephrosis.  Aspiration yielded grossly purulent and bloody urine from the renal collecting system.  The 10-French nephrostomy tube was formed at the level of the renal pelvis.  Contrast injection confirms the presence of high-grade obstruction related to a calculus lodged at the UPJ and proximal ureter.  IMPRESSION:  Left percutaneous nephrostomy tube placement as above.  Aspiration yielded grossly purulent and bloody fluid consistent  with pyonephrosis.  A 10-French catheter was formed at the level of the renal pelvis.  Contrast injection confirms the presence of high- grade obstruction due to a UPJ calculus.  Original Report Authenticated By: Azzie Roup, M.D.   Medications: Scheduled Meds:   . acetaminophen  1,000 mg Oral Once  . aspirin  81 mg Oral Daily  . citalopram  20 mg Oral Daily  . enoxaparin  30 mg Subcutaneous Q24H  . febuxostat  40 mg Oral Daily  . furosemide  20 mg Intravenous Once  . ibuprofen  600 mg Oral Once  . piperacillin-tazobactam (ZOSYN)  IV  2.25 g Intravenous Q8H  . piperacillin-tazobactam (ZOSYN)  IV  3.375 g Intravenous Once  . vancomycin  1,000 mg Intravenous Once  . vancomycin  1,000 mg Intravenous Q48H  . DISCONTD: sodium chloride   Intravenous STAT  . DISCONTD: aspirin  81 mg Oral Daily  . DISCONTD: cefTRIAXone (ROCEPHIN)  IV  1 g Intravenous Q24H   Continuous Infusions:   . sodium chloride 50 mL/hr at 08/17/11 0445  . sodium chloride 300 mL (08/16/11 1701)   PRN Meds:.acetaminophen, albuterol, colchicine, fentaNYL, HYDROcodone-acetaminophen, HYDROmorphone (DILAUDID) injection, midazolam  Assessment/Plan: Hypertension (10/02/2010) Patient's blood pressure is elevated. Start hydralazine when necessary. Decreased IV fluids.  Pacemaker (10/02/2010) Stable no events.    Obstructive uropathy (08/16/2011) Patient had nephrostomy tube placed secondary to obstructive stone per interventional radiology.  Acute renal failure (08/16/2011)/Cystitis Continue broad-spectrum antibiotics, if cultures are all negative then can narrow spectrum back to Rocephin.   Leukocytosis (08/16/2011) Secondary to infection. Improved     Gout (08/16/2011) When necessary meds ordered    Aortic stenosis (08/16/2011) Echo report reviewed patient has a mild to moderate aortic stenosis that is stable.      DO NOT RESUSCITATE  LOS: 1 day   Wynter Isaacs [name] 08/17/2011, 12:47 PM Pager [phone]

## 2011-08-19 DIAGNOSIS — D7289 Other specified disorders of white blood cells: Secondary | ICD-10-CM

## 2011-08-19 DIAGNOSIS — N179 Acute kidney failure, unspecified: Secondary | ICD-10-CM

## 2011-08-19 DIAGNOSIS — N201 Calculus of ureter: Secondary | ICD-10-CM

## 2011-08-19 DIAGNOSIS — N3000 Acute cystitis without hematuria: Secondary | ICD-10-CM

## 2011-08-19 LAB — CBC
Hemoglobin: 12.3 g/dL — ABNORMAL LOW (ref 13.0–17.0)
MCH: 31.2 pg (ref 26.0–34.0)
RBC: 3.94 MIL/uL — ABNORMAL LOW (ref 4.22–5.81)
WBC: 9.9 10*3/uL (ref 4.0–10.5)

## 2011-08-19 LAB — BASIC METABOLIC PANEL
CO2: 23 mEq/L (ref 19–32)
Chloride: 108 mEq/L (ref 96–112)
Glucose, Bld: 94 mg/dL (ref 70–99)
Potassium: 3.7 mEq/L (ref 3.5–5.1)
Sodium: 142 mEq/L (ref 135–145)

## 2011-08-19 MED ORDER — PIPERACILLIN-TAZOBACTAM 3.375 G IVPB
3.3750 g | Freq: Three times a day (TID) | INTRAVENOUS | Status: DC
  Administered 2011-08-19 – 2011-08-20 (×4): 3.375 g via INTRAVENOUS
  Filled 2011-08-19 (×5): qty 50

## 2011-08-19 NOTE — Progress Notes (Signed)
Patient ID: [name] [name], male   DOB: 1921/10/21, 78 y.o.   MRN: [id number]   Subjective: Patient reports doing quite well this morning. He has no new complaints or concerns. He has no pain. He is taking by mouth well.  Objective: Vital signs in last 24 hours: Temp:  [97.5 F (36.4 C)-98.8 F (37.1 C)] 97.5 F (36.4 C) (05/27 0709) Pulse Rate:  [74-75] 75  (05/27 0709) Resp:  [16-20] 18  (05/27 0709) BP: (158-184)/(78-91) 166/79 mmHg (05/27 0709) SpO2:  [95 %-97 %] 96 % (05/27 0709)  Intake/Output from previous day: 05/26 0701 - 05/27 0700 In: 2288 [P.O.:720; I.V.:1298; IV Piggyback:250] Out: 2802 [Urine:2800; Stool:2] Intake/Output this shift: Total I/O In: 360 [P.O.:360] Out: 275 [Urine:275]  Physical Exam:  Constitutional: Vital signs reviewed. WD WN in NAD   Pulmonary/Chest: Normal effort Abdominal: Soft. Non-tender, non-distended, bowel sounds are normal, no masses, organomegaly, or guarding present.  Genitourinary: Ileal loop draining well. Nephrostomy tube draining clear urine on the left side. Extremities: No cyanosis or edema   Lab Results:  Basename 08/19/11 0442 08/18/11 0506 08/17/11 0341  HGB 12.3* 11.7* 11.7*  HCT 36.7* 34.7* 35.5*   BMET  Basename 08/19/11 0442 08/18/11 0506  NA 142 139  K 3.7 3.8  CL 108 107  CO2 23 22  GLUCOSE 94 93  BUN 46* 60*  CREATININE 2.21* 3.25*  CALCIUM 8.1* 7.5*    Basename 08/16/11 1015  LABPT --  INR 1.22   No results found for this basename: LABURIN:1 in the last 72 hours Results for orders placed during the hospital encounter of 08/16/11  URINE CULTURE     Status: Normal   Collection Time   08/16/11  4:11 PM      Component Value Range Status Comment   Specimen Description URINE, RANDOM   Final    Special Requests rochephin Normal   Final    Culture  Setup Time [id number]   Final    Colony Count >=100,000 COLONIES/ML   Final    Culture     Final    Value: Multiple bacterial morphotypes present, none  predominant. Suggest appropriate recollection if clinically indicated.   Report Status 08/18/2011 FINAL   Final   CULTURE, BLOOD (ROUTINE X 2)     Status: Normal (Preliminary result)   Collection Time   08/16/11  7:40 PM      Component Value Range Status Comment   Specimen Description BLOOD RIGHT HAND   Final    Special Requests BOTTLES DRAWN AEROBIC AND ANAEROBIC 4CC   Final    Culture  Setup Time [id number]   Final    Culture     Final    Value:        BLOOD CULTURE RECEIVED NO GROWTH TO DATE CULTURE WILL BE HELD FOR 5 DAYS BEFORE ISSUING A FINAL NEGATIVE REPORT   Report Status PENDING   Incomplete   CULTURE, BLOOD (ROUTINE X 2)     Status: Normal (Preliminary result)   Collection Time   08/16/11  7:46 PM      Component Value Range Status Comment   Specimen Description BLOOD LEFT HAND   Final    Special Requests BOTTLES DRAWN AEROBIC AND ANAEROBIC 3CC   Final    Culture  Setup Time [id number]   Final    Culture     Final    Value:        BLOOD CULTURE RECEIVED NO GROWTH TO DATE CULTURE WILL BE HELD  FOR 5 DAYS BEFORE ISSUING A FINAL NEGATIVE REPORT   Report Status PENDING   Incomplete   MRSA PCR SCREENING     Status: Normal   Collection Time   08/16/11  7:54 PM      Component Value Range Status Comment   MRSA by PCR NEGATIVE  NEGATIVE  Final   URINE CULTURE     Status: Normal   Collection Time   08/17/11  2:50 PM      Component Value Range Status Comment   Specimen Description URINE, RANDOM   Final    Special Requests NONE   Final    Culture  Setup Time [id number]   Final    Colony Count NO GROWTH   Final    Culture NO GROWTH   Final    Report Status 08/18/2011 FINAL   Final     Studies/Results: No results found.  Assessment/Plan:  Clinically doing well. Dr. Jeffie Pollock will need to schedule him for definitive stone management in the near future.   LOS: 3 days   Twylia Oka S 08/19/2011, 9:47 AM

## 2011-08-19 NOTE — Progress Notes (Signed)
Subjective: Patient is sitting up in the chair. He has no complaints today to   Objective: Weight change:   Intake/Output Summary (Last 24 hours) at 08/17/11 1247 Last data filed at 08/17/11 0900  Gross per 24 hour  Intake    900 ml  Output    750 ml  Net    150 ml    Filed Vitals:   08/17/11 0800  BP: 120/60  Pulse: 75  Temp: 97.8 F (36.6 C)  Resp: 13   General: Patient does not seem to be in any acute distress. Cardiovascular: Regular rate rhythm Lungs: Decreased breath sounds in the bases Abdomen: Soft nontender none the tended positive bowel sounds mild CVA tenderness on the left.  Extremities trace edema.  Lab Results: Reviewed  Micro Results: Recent Results (from the past 240 hour(s))  MRSA PCR SCREENING     Status: Normal   Collection Time   08/16/11  7:54 PM      Component Value Range Status Comment   MRSA by PCR NEGATIVE  NEGATIVE  Final     Studies/Results: Ct Abdomen Pelvis Wo Contrast  08/16/2011  *RADIOLOGY REPORT*  Clinical Data: Left flank pain and history of bilateral renal calculi including prior right nephrolithotomy on 07/10/2010. History of bladder carcinoma with prior cystectomy and ileal conduit formation.  CT ABDOMEN AND PELVIS WITHOUT CONTRAST  Technique:  Multidetector CT imaging of the abdomen and pelvis was performed following the standard protocol without intravenous contrast.  Comparison: 05/09/2010  Findings: There is evidence of moderately severe left-sided hydronephrosis secondary to an obstructing calculus at the ureteropelvic junction measuring approximately 5 x 9 x 12 mm. Nonobstructing lower pole calculi are present measuring approximately 3 mm and 5 mm in respective greatest diameter.  No other left-sided calculi or calcifications in the ileal conduit are identified.  The ileal conduit is nondistended and shows no gross abnormalities at the level of a right lower quadrant ileostomy. There is a tiny nonobstructing calculus in the lower  pole of the right kidney.  The right kidney is relatively atrophic compared to the left.  Stable renal cyst in the posterior right kidney.  Stable gallstones.  No abnormal fluid collections.  Other unenhanced solid organs and bowel are unremarkable.  Stable left inguinal hernia containing fat.  IMPRESSION: Moderately severe left hydronephrosis secondary to an obstructing calculus at the UPJ measuring 12 mm in greatest dimensions.  Other nonobstructing calculi are present bilaterally.  Original Report Authenticated By: Azzie Roup, M.D.   Dg Chest 1 View  08/16/2011  *RADIOLOGY REPORT*  Clinical Data: Dyspnea, shortness of breath, weakness, nephrostomy tube placed  CHEST - 1 VIEW  Comparison: 07/04/2010  Findings: Lungs are essentially clear.  Stable calcified granuloma in the lateral right upper lobe.  No pleural effusion or pneumothorax.  The heart is top normal in size for inspiration.  Left subclavian pacemaker.  IMPRESSION: No evidence of acute cardiopulmonary disease.  Original Report Authenticated By: [name] Hy, M.D.   Ir Perc Nephrostomy Left  08/16/2011  *RADIOLOGY REPORT*  Clinical Data: Left renal obstruction due to UPJ calculus.  Status post prior cystectomy and ileal conduit formation.  Elevated white blood cell count.  1.  ULTRASOUND GUIDANCE FOR PUNCTURE OF THE LEFT RENAL COLLECTING SYSTEM. 2.  LEFT PERCUTANEOUS NEPHROSTOMY TUBE PLACEMENT  Comparison:  CT of the abdomen pelvis performed earlier today.  Sedation: 1.0 mg IV Versed; 25 mcg IV Fentanyl.  Total Moderate Sedation Time: 60minutes.  Contrast:  10 ml Omnipaque 300  Fluoroscopy Time: 1.5 minutes.  Procedure:  The procedure, risks, benefits, and alternatives were explained to the patient.  Questions regarding the procedure were encouraged and answered.  The patient understands and consents to the procedure.  The left flank region was prepped with Betadine in a sterile fashion, and a sterile drape was applied covering the  operative field.  A sterile gown and sterile gloves were used for the procedure. Local anesthesia was provided with 1% Lidocaine.  Ultrasound was used to localize the left kidney.  Under direct ultrasound guidance, a 21 gauge needle was advanced into the renal collecting system.  Ultrasound image documentation was performed. Aspiration of urine sample was performed followed by contrast injection.  A urine sample was sent for culture analysis.  A transitional dilator was advanced over a guidewire.  Percutaneous tract dilatation was then performed over the guidewire.  A 10- French percutaneous nephrostomy tube was then advanced and formed in the collecting system.  Catheter position was confirmed by fluoroscopy after contrast injection.  The catheter was secured at the skin with a Prolene retention suture and Stat-Lock device.  A gravity bag was placed.  Complications: None  Findings:  Ultrasound confirms moderately severe left hydronephrosis.  Aspiration yielded grossly purulent and bloody urine from the renal collecting system.  The 10-French nephrostomy tube was formed at the level of the renal pelvis.  Contrast injection confirms the presence of high-grade obstruction related to a calculus lodged at the UPJ and proximal ureter.  IMPRESSION:  Left percutaneous nephrostomy tube placement as above.  Aspiration yielded grossly purulent and bloody fluid consistent with pyonephrosis.  A 10-French catheter was formed at the level of the renal pelvis.  Contrast injection confirms the presence of high- grade obstruction due to a UPJ calculus.  Original Report Authenticated By: Azzie Roup, M.D.   Ir US Guide Bx Asp/drain  08/16/2011  *RADIOLOGY REPORT*  Clinical Data: Left renal obstruction due to UPJ calculus.  Status post prior cystectomy and ileal conduit formation.  Elevated white blood cell count.  1.  ULTRASOUND GUIDANCE FOR PUNCTURE OF THE LEFT RENAL COLLECTING SYSTEM. 2.  LEFT PERCUTANEOUS NEPHROSTOMY TUBE  PLACEMENT  Comparison:  CT of the abdomen pelvis performed earlier today.  Sedation: 1.0 mg IV Versed; 25 mcg IV Fentanyl.  Total Moderate Sedation Time: 2minutes.  Contrast:  10 ml Omnipaque 300  Fluoroscopy Time: 1.5 minutes.  Procedure:  The procedure, risks, benefits, and alternatives were explained to the patient.  Questions regarding the procedure were encouraged and answered.  The patient understands and consents to the procedure.  The left flank region was prepped with Betadine in a sterile fashion, and a sterile drape was applied covering the operative field.  A sterile gown and sterile gloves were used for the procedure. Local anesthesia was provided with 1% Lidocaine.  Ultrasound was used to localize the left kidney.  Under direct ultrasound guidance, a 21 gauge needle was advanced into the renal collecting system.  Ultrasound image documentation was performed. Aspiration of urine sample was performed followed by contrast injection.  A urine sample was sent for culture analysis.  A transitional dilator was advanced over a guidewire.  Percutaneous tract dilatation was then performed over the guidewire.  A 10- French percutaneous nephrostomy tube was then advanced and formed in the collecting system.  Catheter position was confirmed by fluoroscopy after contrast injection.  The catheter was secured at the skin with a Prolene retention suture and Stat-Lock device.  A gravity bag was  placed.  Complications: None  Findings:  Ultrasound confirms moderately severe left hydronephrosis.  Aspiration yielded grossly purulent and bloody urine from the renal collecting system.  The 10-French nephrostomy tube was formed at the level of the renal pelvis.  Contrast injection confirms the presence of high-grade obstruction related to a calculus lodged at the UPJ and proximal ureter.  IMPRESSION:  Left percutaneous nephrostomy tube placement as above.  Aspiration yielded grossly purulent and bloody fluid consistent with  pyonephrosis.  A 10-French catheter was formed at the level of the renal pelvis.  Contrast injection confirms the presence of high- grade obstruction due to a UPJ calculus.  Original Report Authenticated By: Azzie Roup, M.D.   Medications: Scheduled Meds:   . acetaminophen  1,000 mg Oral Once  . aspirin  81 mg Oral Daily  . citalopram  20 mg Oral Daily  . enoxaparin  30 mg Subcutaneous Q24H  . febuxostat  40 mg Oral Daily  . furosemide  20 mg Intravenous Once  . ibuprofen  600 mg Oral Once  . piperacillin-tazobactam (ZOSYN)  IV  2.25 g Intravenous Q8H  . piperacillin-tazobactam (ZOSYN)  IV  3.375 g Intravenous Once  . vancomycin  1,000 mg Intravenous Once  . vancomycin  1,000 mg Intravenous Q48H  . DISCONTD: sodium chloride   Intravenous STAT  . DISCONTD: aspirin  81 mg Oral Daily  . DISCONTD: cefTRIAXone (ROCEPHIN)  IV  1 g Intravenous Q24H   Continuous Infusions:   . sodium chloride 50 mL/hr at 08/17/11 0445  . sodium chloride 300 mL (08/16/11 1701)   PRN Meds:.acetaminophen, albuterol, colchicine, fentaNYL, HYDROcodone-acetaminophen, HYDROmorphone (DILAUDID) injection, midazolam  Assessment/Plan: Hypertension (10/02/2010) Patient's blood pressure is elevated. Start hydralazine when necessary. Patient may need to go on scheduled medications if his blood pressure continues to be elevated.  Pacemaker (10/02/2010) Stable no events.    Obstructive uropathy (08/16/2011) Patient had nephrostomy tube placed secondary to obstructive stone per interventional radiology.  Acute renal failure (08/16/2011)/Cystitis Continue broad-spectrum antibiotics, if cultures are all negative then can narrow spectrum back to Rocephin. Creatinine is improving .  Leukocytosis (08/16/2011) Secondary to infection. Improved     Gout (08/16/2011) When necessary meds ordered    Aortic stenosis (08/16/2011) Echo report reviewed patient has a mild to moderate aortic stenosis that is stable.      DO  NOT RESUSCITATE Disposition: Will await recommendation from urology.  LOS: 1 day   Ketina Mars [name] 08/17/2011, 12:47 PM Pager [phone]

## 2011-08-19 NOTE — Evaluation (Signed)
Physical Therapy Evaluation Patient Details Name: [name] [name] MRN: [id number] DOB: 1921-12-13 Today's Date: 08/19/2011 Time: [id number] PT Time Calculation (min): 11 min  PT Assessment / Plan / Recommendation Clinical Impression  Pt diagnosed with obstructive uropathy and had nephrostomy tube placed.  Pt would benefit from acute PT services in order to improve safety and independence with transfers and ambulation and increase activity tolerance to prepare pt for d/c home alone.  Pt would like to return to PLOF: walking 2 laps a day and gardening.    PT Assessment  Patient needs continued PT services    Follow Up Recommendations  Home health PT    Barriers to Discharge        lEquipment Recommendations  None recommended by PT    Recommendations for Other Services     Frequency Min 3X/week    Precautions / Restrictions Precautions Precautions: Fall   Pertinent Vitals/Pain       Mobility  Bed Mobility Bed Mobility: Supine to Sit Supine to Sit: 6: Modified independent (Device/Increase time) Transfers Transfers: Sit to Stand;Stand to Sit Sit to Stand: 4: Min guard;From bed Stand to Sit: 4: Min guard;To chair/3-in-1 Details for Transfer Assistance: verbal cues for hand placement Ambulation/Gait Ambulation/Gait Assistance: 4: Min guard Ambulation Distance (Feet): 140 Feet Assistive device: 1 person hand held assist Ambulation/Gait Assistance Details: pt with occasional unsteadiness but reports he hasn't been ambulating since admission Gait Pattern: Decreased stride length;Wide base of support;Step-through pattern Gait velocity: decreased    Exercises     PT Diagnosis: Difficulty walking  PT Problem List: Decreased activity tolerance;Decreased mobility;Decreased safety awareness PT Treatment Interventions: DME instruction;Gait training;Stair training;Functional mobility training;Therapeutic activities;Therapeutic exercise;Patient/family education;Balance training    PT Goals Acute Rehab PT Goals PT Goal Formulation: With patient Time For Goal Achievement: 08/26/11 Potential to Achieve Goals: Good Pt will go Sit to Stand: with modified independence PT Goal: Sit to Stand - Progress: Goal set today Pt will go Stand to Sit: with modified independence PT Goal: Stand to Sit - Progress: Goal set today Pt will Ambulate: >150 feet;with modified independence;with least restrictive assistive device PT Goal: Ambulate - Progress: Goal set today  Visit Information  Last PT Received On: 08/19/11 Assistance Needed: +1    Subjective Data  Subjective: "I haven't been walking since I got here."   Prior [place] Lives With: Alone Type of Home: [place]: Laundry or work area in [place]: Straight cane;Walker - rolling Prior Function Level of Independence: Independent Comments: Pt reports walking daily and gardening. Communication Communication: No difficulties    Cognition  Overall Cognitive Status: Appears within functional limits for tasks assessed/performed Arousal/Alertness: Awake/alert Orientation Level: Appears intact for tasks assessed Behavior During Session: [facility] for tasks performed    Extremity/Trunk Assessment Right Upper Extremity Assessment RUE ROM/Strength/Tone: [facility] for tasks assessed Left Upper Extremity Assessment LUE ROM/Strength/Tone: [facility] for tasks assessed Right Lower Extremity Assessment RLE ROM/Strength/Tone: [facility] for tasks assessed Left Lower Extremity Assessment LLE ROM/Strength/Tone: [facility] for tasks assessed   Balance    End of Session PT - End of Session Activity Tolerance: Patient limited by fatigue Patient left: in chair;with call bell/phone within reach   [facility] E 08/19/2011, 9:32 AM Pager: [phone]

## 2011-08-19 NOTE — Progress Notes (Signed)
ANTIBIOTIC CONSULT NOTE - FOLLOW UP  Pharmacy Consult for Zosyn Indication: Sepsis (possible source: kidney stone)  No Known Allergies  Patient Measurements: Height: 5\' 8"  (172.7 cm) Weight: 162 lb 0.6 oz (73.5 kg) IBW/kg (Calculated) : 68.4   Vital Signs: Temp: 97.5 F (36.4 C) (05/27 0709) Temp src: Axillary (05/27 0709) BP: 166/79 mmHg (05/27 0709) Pulse Rate: 75  (05/27 0709) Intake/Output from previous day: 05/26 0701 - 05/27 0700 In: 2288 [P.O.:720; I.V.:1298; IV Piggyback:250] Out: 2802 [Urine:2800; Stool:2] Intake/Output from this shift:    Labs:  Basename 08/19/11 0442 08/18/11 0506 08/17/11 0341  WBC 9.9 11.5* 18.3*  HGB 12.3* 11.7* 11.7*  PLT 172 153 136*  LABCREA -- -- --  CREATININE 2.21* 3.25* 4.47*   Estimated Creatinine Clearance: 22.8 ml/min (by C-G formula based on Cr of 2.21).  Microbiology: Recent Results (from the past 720 hour(s))  URINE CULTURE     Status: Normal   Collection Time   08/16/11  4:11 PM      Component Value Range Status Comment   Specimen Description URINE, RANDOM   Final    Special Requests rochephin Normal   Final    Culture  Setup Time [id number]   Final    Colony Count >=100,000 COLONIES/ML   Final    Culture     Final    Value: Multiple bacterial morphotypes present, none predominant. Suggest appropriate recollection if clinically indicated.   Report Status 08/18/2011 FINAL   Final   CULTURE, BLOOD (ROUTINE X 2)     Status: Normal (Preliminary result)   Collection Time   08/16/11  7:40 PM      Component Value Range Status Comment   Specimen Description BLOOD RIGHT HAND   Final    Special Requests BOTTLES DRAWN AEROBIC AND ANAEROBIC 4CC   Final    Culture  Setup Time [id number]   Final    Culture     Final    Value:        BLOOD CULTURE RECEIVED NO GROWTH TO DATE CULTURE WILL BE HELD FOR 5 DAYS BEFORE ISSUING A FINAL NEGATIVE REPORT   Report Status PENDING   Incomplete   CULTURE, BLOOD (ROUTINE X 2)     Status:  Normal (Preliminary result)   Collection Time   08/16/11  7:46 PM      Component Value Range Status Comment   Specimen Description BLOOD LEFT HAND   Final    Special Requests BOTTLES DRAWN AEROBIC AND ANAEROBIC 3CC   Final    Culture  Setup Time [id number]   Final    Culture     Final    Value:        BLOOD CULTURE RECEIVED NO GROWTH TO DATE CULTURE WILL BE HELD FOR 5 DAYS BEFORE ISSUING A FINAL NEGATIVE REPORT   Report Status PENDING   Incomplete   MRSA PCR SCREENING     Status: Normal   Collection Time   08/16/11  7:54 PM      Component Value Range Status Comment   MRSA by PCR NEGATIVE  NEGATIVE  Final   URINE CULTURE     Status: Normal   Collection Time   08/17/11  2:50 PM      Component Value Range Status Comment   Specimen Description URINE, RANDOM   Final    Special Requests NONE   Final    Culture  Setup Time [id number]   Final    Colony Count NO GROWTH  Final    Culture NO GROWTH   Final    Report Status 08/18/2011 FINAL   Final     Assessment:  78 yo M admitted 08/16/11 with left UPJ obstructing stone with moderate to severe hydronephrosis, now s/p L perc tube. Pertinent PMH includes renal failure/CKD, bladder cancer, and kidney stones.   This is day #4 antibiotics with Zosyn.  Vancomycin d/c'd yesterday.  Renal function slowly improving, now CrCl > 20 ml/min, warranting a dose adjustment to Zosyn dosing.  Cultures no growth to date.  Goal of Therapy:  dose adjusted per renal clearance  Plan:  Adjust Zosyn to 3.375g IV q8h (4 hour infusion time). F/u SCr and culture results.  Hershal Coria 08/19/2011,7:16 AM

## 2011-08-20 DIAGNOSIS — N200 Calculus of kidney: Secondary | ICD-10-CM | POA: Diagnosis present

## 2011-08-20 LAB — CBC
HCT: 36.6 % — ABNORMAL LOW (ref 39.0–52.0)
Hemoglobin: 12.1 g/dL — ABNORMAL LOW (ref 13.0–17.0)
MCH: 30.9 pg (ref 26.0–34.0)
MCV: 93.4 fL (ref 78.0–100.0)
RBC: 3.92 MIL/uL — ABNORMAL LOW (ref 4.22–5.81)
WBC: 10.3 10*3/uL (ref 4.0–10.5)

## 2011-08-20 LAB — BASIC METABOLIC PANEL
CO2: 24 mEq/L (ref 19–32)
Calcium: 8.7 mg/dL (ref 8.4–10.5)
Chloride: 108 mEq/L (ref 96–112)
Creatinine, Ser: 1.91 mg/dL — ABNORMAL HIGH (ref 0.50–1.35)
Glucose, Bld: 94 mg/dL (ref 70–99)

## 2011-08-20 MED ORDER — HYDROCODONE-ACETAMINOPHEN 5-325 MG PO TABS: 1.0000 | ORAL_TABLET | ORAL | Status: AC | PRN

## 2011-08-20 MED ORDER — CIPROFLOXACIN HCL 250 MG PO TABS: 250.0000 mg | ORAL_TABLET | Freq: Two times a day (BID) | ORAL | Status: AC

## 2011-08-20 MED ORDER — AMLODIPINE BESYLATE 5 MG PO TABS: 5.0000 mg | ORAL_TABLET | Freq: Every day | ORAL | Status: DC

## 2011-08-20 NOTE — Care Management Note (Signed)
    Page 1 of 1   08/20/2011     1:59:56 PM   CARE MANAGEMENT NOTE 08/20/2011  Patient:  [name] [name], [name] [name]   Account Number:  [id number]  Date Initiated:  08/20/2011  Documentation initiated by:  Dessa Phi  Subjective/Objective Assessment:   ADMITTED W/OBSTRUCTIVE UROPATHY     Action/Plan:   FROM HOME ALONE   Anticipated DC Date:  08/20/2011   Anticipated DC Plan:  [place]  CM consult      Choice offered to / List presented to:  C-1 Patient        [place] arranged  HH-1 RN  [place].   Status of service:  Completed, signed off Medicare Important Message given?   (If response is "NO", the following Medicare IM given date fields will be blank) Date Medicare IM given:   Date Additional Medicare IM given:    Discharge Disposition:  [place]  Per UR Regulation:  Reviewed for med. necessity/level of care/duration of stay  If discussed at Long Length of Stay Meetings, dates discussed:    Comments:  08/20/11 Dayven Linsley RN,BSN NCM [phone] [facility]AN([place])CONTACTED FOR HHRN/PT.

## 2011-08-20 NOTE — Progress Notes (Signed)
Patient ID: [name] [name], male   DOB: 27-Feb-1922, 78 y.o.   MRN: [id number]    Subjective: [name] is doing well.  He is afebrile and without complaints.  His Cr is down to 1.91.  He has a left perc and will need a PCNL. ROS: Negative except as above  Objective: Vital signs in last 24 hours: Temp:  [97.7 F (36.5 C)-98.2 F (36.8 C)] 97.8 F (36.6 C) (05/28 0534) Pulse Rate:  [73-75] 74  (05/28 0534) Resp:  [14-18] 18  (05/28 0534) BP: (136-191)/(64-89) 188/89 mmHg (05/28 0534) SpO2:  [96 %] 96 % (05/28 0534)  Intake/Output from previous day: 05/27 0701 - 05/28 0700 In: 1430 [P.O.:1320; IV Piggyback:100] Out: 1901 [Urine:1900; Stool:1] Intake/Output this shift:    General appearance: alert and no distress  Lab Results:   Basename 08/20/11 0440 08/19/11 0442  WBC 10.3 9.9  HGB 12.1* 12.3*  HCT 36.6* 36.7*  PLT 184 172   BMET  Basename 08/20/11 0440 08/19/11 0442  NA 142 142  K 3.8 3.7  CL 108 108  CO2 24 23  GLUCOSE 94 94  BUN 35* 46*  CREATININE 1.91* 2.21*  CALCIUM 8.7 8.1*   PT/INR No results found for this basename: LABPROT:2,INR:2 in the last 72 hours ABG No results found for this basename: PHART:2,PCO2:2,PO2:2,HCO3:2 in the last 72 hours  Studies/Results: No results found.  Anti-infectives: Anti-infectives     Start     Dose/Rate Route Frequency Ordered Stop   08/19/11 1000  piperacillin-tazobactam (ZOSYN) IVPB 3.375 g       3.375 g 12.5 mL/hr over 240 Minutes Intravenous Every 8 hours 08/19/11 0716     08/18/11 1800   vancomycin (VANCOCIN) IVPB 1000 mg/200 mL premix  Status:  Discontinued        1,000 mg 200 mL/hr over 60 Minutes Intravenous Every 48 hours 08/16/11 2036 08/18/11 1455   08/17/11 1000   cefTRIAXone (ROCEPHIN) 1 g in dextrose 5 % 50 mL IVPB  Status:  Discontinued        1 g 100 mL/hr over 30 Minutes Intravenous Every 24 hours 08/16/11 1916 08/16/11 2034   08/17/11 0200   piperacillin-tazobactam (ZOSYN) IVPB 2.25 g   Status:  Discontinued        2.25 g 100 mL/hr over 30 Minutes Intravenous Every 8 hours 08/16/11 2036 08/19/11 0716   08/16/11 1700   vancomycin (VANCOCIN) IVPB 1000 mg/200 mL premix        1,000 mg 200 mL/hr over 60 Minutes Intravenous  Once 08/16/11 1644 08/16/11 1911   08/16/11 1700  piperacillin-tazobactam (ZOSYN) IVPB 3.375 g       3.375 g 12.5 mL/hr over 240 Minutes Intravenous  Once 08/16/11 1648 08/16/11 2211   08/16/11 0945   cefTRIAXone (ROCEPHIN) 1 g in dextrose 5 % 50 mL IVPB        1 g 100 mL/hr over 30 Minutes Intravenous  Once 08/16/11 0930 08/16/11 1045          Current Facility-Administered Medications  Medication Dose Route Frequency Provider Last Rate Last Dose  . 0.9 %  sodium chloride infusion   Intravenous Continuous Ernest Haber, MD 10 mL/hr at 08/18/11 1820    . 0.9 %  sodium chloride infusion   Intravenous Continuous Ernest Haber, MD 10 mL/hr at 08/16/11 1701 300 mL at 08/16/11 1701  . acetaminophen (TYLENOL) tablet 650 mg  650 mg Oral Q6H PRN Ernest Haber, MD   650 mg  at 08/18/11 1518  . albuterol (PROVENTIL) (5 MG/ML) 0.5% nebulizer solution 2.5 mg  2.5 mg Nebulization Q2H PRN Ernest Haber, MD      . aspirin chewable tablet 81 mg  81 mg Oral Daily Ernest Haber, MD   81 mg at 08/19/11 1012  . citalopram (CELEXA) tablet 20 mg  20 mg Oral Daily Ernest Haber, MD   20 mg at 08/19/11 1012  . colchicine tablet 0.6 mg  0.6 mg Oral PRN Ernest Haber, MD      . enoxaparin (LOVENOX) injection 30 mg  30 mg Subcutaneous Q24H Ernest Haber, MD   30 mg at 08/19/11 2126  . febuxostat (ULORIC) tablet 40 mg  40 mg Oral Daily Ernest Haber, MD   40 mg at 08/19/11 1012  . hydrALAZINE (APRESOLINE) injection 10 mg  10 mg Intravenous Q4H PRN Ernest Haber, MD   10 mg at 08/20/11 0640  . HYDROcodone-acetaminophen (NORCO) 5-325 MG per tablet 1-2 tablet  1-2 tablet Oral Q4H PRN Ernest Haber, MD      . HYDROmorphone (DILAUDID) injection 0.5 mg  0.5 mg Intravenous  Q4H PRN Ernest Haber, MD      . piperacillin-tazobactam (ZOSYN) IVPB 3.375 g  3.375 g Intravenous Q8H Donald Prose Runyon, PHARMD   3.375 g at 08/20/11 0200    Assessment: He continues to improve following placement of the left perc tube. His culture on admission grew mx species but a f/u culture is negative and blood cultures were negative.  Plan: He will need a left percutaneous nephrolithotomy, but it will not be during this admission.  I will work on getting the procedure scheduled. We don't have a culture to guide further antibiotic therapy but could probably be discharged on cipro to complete a course of antibiotics.   LOS: 4 days    Malka So 08/20/2011

## 2011-08-20 NOTE — Discharge Summary (Signed)
DISCHARGE SUMMARY  [name] [name]  MR#: [id number]  DOB:Sep 21, 1921  Date of Admission: 08/16/2011 Date of Discharge: 08/20/2011  Attending Physician:[name] [name]  Patient's [id number] A, MD, MD  Consults: Treatment Team: [name] So, MD Interventional radiology   Discharge Diagnoses: .Obstructive uropathy .Acute renal failure .Leukocytosis .Gout .Aortic stenosis .Kidney stone Hypertension   Initial presentation: Patient is an 78 year old white male with past medical history significant for multiple medical problems including prostate cancer and bladder cancer status post complete cystectomy with ureteroileal conduit. Patient stated that for the past 2 days he's been having nausea vomiting and flank pain. Pain radiates into the right groin region. He's been having some chills but no fevers. He took Tylenol for the pain with some relief. His symptoms were progressing [name] he presented to the emergency room. He had a CT of the abdomen and pelvis that shows a right UPJ obstructing stone with moderate to severe hydronephrosis. We were asked to admit patient for the above.      Medication List  As of 08/20/2011  1:18 PM   TAKE these medications         acetaminophen 325 MG tablet   Commonly known as: TYLENOL   Take 650 mg by mouth every 6 (six) hours as needed. For pain      amLODipine 5 MG tablet   Commonly known as: NORVASC   Take 1 tablet (5 mg total) by mouth daily.      aspirin 81 MG tablet   Take 81 mg by mouth daily.      ciprofloxacin 250 MG tablet   Commonly known as: CIPRO   Take 1 tablet (250 mg total) by mouth 2 (two) times daily.      citalopram 20 MG tablet   Commonly known as: CELEXA   Take 20 mg by mouth daily.      colchicine 0.6 MG tablet   Take 0.6 mg by mouth as needed.      HYDROcodone-acetaminophen 5-325 MG per tablet   Commonly known as: NORCO   Take 1-2 tablets by mouth every 4 (four) hours as needed.      ibandronate 150  MG tablet   Commonly known as: BONIVA   Take 150 mg by mouth every 30 (thirty) days. Take in the morning with a full glass of water, on an empty stomach, and do not take anything else by mouth or lie down for the next 30 min.      ULORIC 40 MG tablet   Generic drug: febuxostat      VITAMIN D-3 PO   Take by mouth. 1000 iu  daily           Hospital Course: .Obstructive uropathy/.Kidney stone Patient came into the hospital secondary to  Kidney stones obstructing the ureter. He had bilateral nephrostomy percutaneous nephrostomy tube placed per interventional radiology. He will followup outpatient with urology Dr. and at that time it will be determined when to do stooling root removal. This was discussed with patient. His urine cultures came back negative. Will discharge him on Cipro 250 mg twice daily for 10 days. Patient feels fine. Blood cultures negative as well.   .Acute renal failure Patient was admitted with acute renal failure secondary to obstructive uropathy. Creatinine has improved, it is down to 1.91 from over 4. Patient is almost back to his baseline kidney function. Kidney function can be followed up outpatient.   .Leukocytosis Leukocytosis has resolved with treatment with antibiotic.   Marland KitchenGout Patient  can followup outpatient with his when necessary medications for gout. No events while he was inpatient.   .Aortic stenosis Patient had 2-D echo done that showed mild to moderate aortic stenosis. He had an event where he was receiving IV fluids and developed respiratory failure. His IV fluid was discontinued and patient was given Lasix. Respiratory problems has resolved and his breathing normal with good oxygenation.    Hypertension Patient has a history of hypertension but he was not on antihypertensive medications at the time of admission. He will be discharged home and Norvasc as his blood pressure is elevated. That can be followed up outpatient with his primary care  physician.     Day of Discharge BP 188/84  Pulse 80  Temp(Src) 97.9 F (36.6 C) (Oral)  Resp 16  Ht 5\' 8"  (1.727 m)  Wt 73.5 kg (162 lb 0.6 oz)  BMI 24.64 kg/m2  SpO2 98%  Physical Exam: General: Patient does not seem to be in any acute distress.  Cardiovascular: Regular rate rhythm  Lungs: Decreased breath sounds in the bases  Abdomen: Soft nontender none the tended positive bowel sounds mild CVA tenderness on the left.  Extremities trace edema.   Results for orders placed during the hospital encounter of 08/16/11 (from the past 24 hour(s))  CBC     Status: Abnormal   Collection Time   08/20/11  4:40 AM      Component Value Range   WBC 10.3  4.0 - 10.5 (K/uL)   RBC 3.92 (*) 4.22 - 5.81 (MIL/uL)   Hemoglobin 12.1 (*) 13.0 - 17.0 (g/dL)   HCT 36.6 (*) 39.0 - 52.0 (%)   MCV 93.4  78.0 - 100.0 (fL)   MCH 30.9  26.0 - 34.0 (pg)   MCHC 33.1  30.0 - 36.0 (g/dL)   RDW 14.0  11.5 - 15.5 (%)   Platelets 184  150 - 400 (K/uL)  BASIC METABOLIC PANEL     Status: Abnormal   Collection Time   08/20/11  4:40 AM      Component Value Range   Sodium 142  135 - 145 (mEq/L)   Potassium 3.8  3.5 - 5.1 (mEq/L)   Chloride 108  96 - 112 (mEq/L)   CO2 24  19 - 32 (mEq/L)   Glucose, Bld 94  70 - 99 (mg/dL)   BUN 35 (*) 6 - 23 (mg/dL)   Creatinine, Ser 1.91 (*) 0.50 - 1.35 (mg/dL)   Calcium 8.7  8.4 - 10.5 (mg/dL)   GFR calc non Af Amer 30 (*) >90 (mL/min)   GFR calc Af Amer 35 (*) >90 (mL/min)    Disposition: Home with physical therapy   Follow-up Appts: Discharge Orders    Future Appointments: Provider: Department: Dept Phone: Center:   10/03/2011 10:15 AM [name] [name], [place] [phone] [id number]     Future Orders Please Complete By Expires   Ambulatory referral to Home Health      Comments:   Please evaluate [name] [name] for admission to [facility].  Disciplines requested: RN and PT Services to provide: Physician to follow patient's care Dr  [name] [name] Requested Start of Care Date: 08/20/11 Special Instructions:  Dressing change daily around percutaneous nephrostomy tube. RN to record output. Call interventional radiology at [facility] long if problems should arise with excess bleeding.   Diet - low sodium heart healthy      Increase activity slowly         Follow-up Information  Schedule an appointment as soon as possible for a visit with [name] So, MD.   Contact information:   [place] [address] [place] [phone]       Follow up with Crist Infante A, MD in 1 week.   Contact information:   [facility] [place] [phone]          Tests Needing Follow-up: bmp  Time spent in discharge (includes decision making & examination of pt): 60 minutes  Signed: Grizelda Piscopo [name] 08/20/2011, 1:18 PM

## 2011-08-20 NOTE — Progress Notes (Signed)
Subjective: Pt feeling better; has some mild left flank soreness  Objective: Vital signs in last 24 hours: Temp:  [97.7 F (36.5 C)-98.2 F (36.8 C)] 97.8 F (36.6 C) (05/28 0534) Pulse Rate:  [73-75] 74  (05/28 0534) Resp:  [14-18] 18  (05/28 0534) BP: (136-191)/(64-89) 188/89 mmHg (05/28 0534) SpO2:  [96 %] 96 % (05/28 0534) Last BM Date: 08/18/11  Intake/Output from previous day: 05/27 0701 - 05/28 0700 In: 1430 [P.O.:1320; IV Piggyback:100] Out: 1901 [Urine:1900; Stool:1] Intake/Output this shift: Total I/O In: 120 [P.O.:120] Out: 225 [Urine:225]  Left PCN intact, insertion site ok, mildly tender, urine yellow, output last 24 hrs- 900 cc's  Lab Results:   [facility] 08/20/11 0440 08/19/11 0442  WBC 10.3 9.9  HGB 12.1* 12.3*  HCT 36.6* 36.7*  PLT 184 172   BMET  Basename 08/20/11 0440 08/19/11 0442  NA 142 142  K 3.8 3.7  CL 108 108  CO2 24 23  GLUCOSE 94 94  BUN 35* 46*  CREATININE 1.91* 2.21*  CALCIUM 8.7 8.1*   PT/INR No results found for this basename: LABPROT:2,INR:2 in the last 72 hours ABG No results found for this basename: PHART:2,PCO2:2,PO2:2,HCO3:2 in the last 72 hours  Studies/Results: No results found. Results for orders placed during the hospital encounter of 08/16/11  URINE CULTURE     Status: Normal   Collection Time   08/16/11  4:11 PM      Component Value Range Status Comment   Specimen Description URINE, RANDOM   Final    Special Requests rochephin Normal   Final    Culture  Setup Time [id number]   Final    Colony Count >=100,000 COLONIES/ML   Final    Culture     Final    Value: Multiple bacterial morphotypes present, none predominant. Suggest appropriate recollection if clinically indicated.   Report Status 08/18/2011 FINAL   Final   CULTURE, BLOOD (ROUTINE X 2)     Status: Normal (Preliminary result)   Collection Time   08/16/11  7:40 PM      Component Value Range Status Comment   Specimen Description BLOOD RIGHT HAND    Final    Special Requests BOTTLES DRAWN AEROBIC AND ANAEROBIC 4CC   Final    Culture  Setup Time [id number]   Final    Culture     Final    Value:        BLOOD CULTURE RECEIVED NO GROWTH TO DATE CULTURE WILL BE HELD FOR 5 DAYS BEFORE ISSUING A FINAL NEGATIVE REPORT   Report Status PENDING   Incomplete   CULTURE, BLOOD (ROUTINE X 2)     Status: Normal (Preliminary result)   Collection Time   08/16/11  7:46 PM      Component Value Range Status Comment   Specimen Description BLOOD LEFT HAND   Final    Special Requests BOTTLES DRAWN AEROBIC AND ANAEROBIC 3CC   Final    Culture  Setup Time [id number]   Final    Culture     Final    Value:        BLOOD CULTURE RECEIVED NO GROWTH TO DATE CULTURE WILL BE HELD FOR 5 DAYS BEFORE ISSUING A FINAL NEGATIVE REPORT   Report Status PENDING   Incomplete   MRSA PCR SCREENING     Status: Normal   Collection Time   08/16/11  7:54 PM      Component Value Range Status Comment   MRSA by PCR NEGATIVE  NEGATIVE  Final   URINE CULTURE     Status: Normal   Collection Time   08/17/11  2:50 PM      Component Value Range Status Comment   Specimen Description URINE, RANDOM   Final    Special Requests NONE   Final    Culture  Setup Time [id number]   Final    Colony Count NO GROWTH   Final    Culture NO GROWTH   Final    Report Status 08/18/2011 FINAL   Final     Anti-infectives: Anti-infectives     Start     Dose/Rate Route Frequency Ordered Stop   08/19/11 1000  piperacillin-tazobactam (ZOSYN) IVPB 3.375 g       3.375 g 12.5 mL/hr over 240 Minutes Intravenous Every 8 hours 08/19/11 0716     08/18/11 1800   vancomycin (VANCOCIN) IVPB 1000 mg/200 mL premix  Status:  Discontinued        1,000 mg 200 mL/hr over 60 Minutes Intravenous Every 48 hours 08/16/11 2036 08/18/11 1455   08/17/11 1000   cefTRIAXone (ROCEPHIN) 1 g in dextrose 5 % 50 mL IVPB  Status:  Discontinued        1 g 100 mL/hr over 30 Minutes Intravenous Every 24 hours 08/16/11 1916  08/16/11 2034   08/17/11 0200   piperacillin-tazobactam (ZOSYN) IVPB 2.25 g  Status:  Discontinued        2.25 g 100 mL/hr over 30 Minutes Intravenous Every 8 hours 08/16/11 2036 08/19/11 0716   08/16/11 1700   vancomycin (VANCOCIN) IVPB 1000 mg/200 mL premix        1,000 mg 200 mL/hr over 60 Minutes Intravenous  Once 08/16/11 1644 08/16/11 1911   08/16/11 1700  piperacillin-tazobactam (ZOSYN) IVPB 3.375 g       3.375 g 12.5 mL/hr over 240 Minutes Intravenous  Once 08/16/11 1648 08/16/11 2211   08/16/11 0945   cefTRIAXone (ROCEPHIN) 1 g in dextrose 5 % 50 mL IVPB        1 g 100 mL/hr over 30 Minutes Intravenous  Once 08/16/11 0930 08/16/11 1045          Assessment/Plan: s/p left perc nephrostomy 5/24 secondary to UPJ stone/hydro; plans as outlined by urology.   [name] [name],D [facility] 08/20/2011

## 2011-08-20 NOTE — Clinical Documentation Improvement (Signed)
GENERIC DOCUMENTATION CLARIFICATION QUERY  THIS DOCUMENT IS NOT A PERMANENT PART OF THE MEDICAL RECORD  TO RESPOND TO THE THIS QUERY, FOLLOW THE INSTRUCTIONS BELOW:  1. If needed, update documentation for the patient's encounter via the notes activity.  2. Access this query again and click edit on the In [employer].  3. After updating, or not, click F2 to complete all highlighted (required) fields concerning your review. Select "additional documentation in the medical record" OR "no additional documentation provided".  4. Click Sign note button.  5. The deficiency will fall out of your In Basket *Please let us know if you are not able to complete this workflow by phone or e-mail (listed below).  Please update your documentation within the medical record to reflect your response to this query.                                                                                        08/20/11   Dear Dr.Gl[facility],  In a better effort to capture your patient's severity of illness, reflect appropriate length of stay and utilization of resources, a review of the patient medical record has revealed the following indicators.    Based on your clinical judgment, please clarify and document in a progress note and/or discharge summary the clinical condition associated with the following supporting information:  In responding to this query please exercise your independent judgment.  The fact that a query is asked, does not imply that any particular answer is desired or expected.  Pt admitted with obstructing renal calculi with hydronephrosis.  According to H/P pt with h/o bladder cancer.   Per procedure note  On 08/16/11 pt had a left percutaneous nephrostomy for treatment of hydronephrosis     Please clarify if procedure can be linked also to pt's history of bladder cancer and document in pn or d/c summary.     Possible Clinical Conditions?   _______Other  Condition__________________ _______Cannot Clinically Determine   Supporting Information:  Risk Factors: Obstruction renal calculi, hydronephrosis, ARI, HTN, Gout, sepsis,  Signs & Symptoms:  Diagnostics: Component     Latest Ref Rng 08/17/2011 08/18/2011 08/19/2011 08/20/2011  BUN     6 - 23 mg/dL 64 (H) 60 (H) 46 (H) 35 (H)  Creat     0.50 - 1.35 mg/dL 4.47 (H) 3.25 (H) 2.21 (H) 1.91 (H)  Calcium     8.4 - 10.5 mg/dL 7.8 (L) 7.5 (L) 8.1 (L) 8.7   Component     Latest Ref Rng 08/17/2011 08/18/2011 08/19/2011 08/20/2011  GFR calc non Af Amer     >90 mL/min 11 (L) 16 (L) 25 (L) 30 (L)  GFR calc Af Amer     >90 mL/min 12 (L) 18 (L) 29 (L) 35 (L)   Component     Latest Ref Rng 08/17/2011 08/18/2011  WBC     4.0 - 10.5 K/uL 18.3 (H) 11.5 (H)    Treatment Left percutaneous nephrostomy   You may use possible, probable, or suspect with inpatient documentation. possible, probable, suspected diagnoses MUST be documented at the time of discharge  Reviewed:  no additional documentation  provided ljh  Thank You,  Heloise Beecham  RN, BSN, CCDS Clinical Documentation Specialist [facility] Dept Pager: [phone]

## 2011-08-20 NOTE — Progress Notes (Signed)
Physical Therapy Treatment Patient Details Name: [name] [name] MRN: [id number] DOB: 1921/05/13 Today's Date: 08/20/2011 Time: [id number] PT Time Calculation (min): 8 min  PT Assessment / Plan / Recommendation Comments on Treatment Session  Pt ambulated in hallway holding onto PTs arm as pt did not wish to use assistive device today.      Follow Up Recommendations  Home health PT;Supervision - Intermittent    Barriers to Discharge        Equipment Recommendations  None recommended by PT    Recommendations for Other Services    Frequency     Plan Discharge plan needs to be updated;Frequency remains appropriate    Precautions / Restrictions Precautions Precautions: Fall   Pertinent Vitals/Pain No pain    Mobility  Bed Mobility Bed Mobility: Supine to Sit Supine to Sit: 6: Modified independent (Device/Increase time) Transfers Transfers: Sit to Stand;Stand to Sit Sit to Stand: 4: Min guard;With upper extremity assist;From bed Stand to Sit: To chair/3-in-1;With upper extremity assist;4: Min guard Details for Transfer Assistance: verbal cues for hand placement and to control descent Ambulation/Gait Ambulation/Gait Assistance: 4: Min guard Ambulation Distance (Feet): 200 Feet Assistive device: Other (Comment) (held PT's arm) Ambulation/Gait Assistance Details: pt held PT's arm for steadying, encouraged pt to use cane or RW however he did not wish to use today but reports he has them at home if he needs them, encouraged pt to use RW or cane upon d/c for safety Gait Pattern: Decreased stride length;Wide base of support;Step-through pattern    Exercises     PT Diagnosis:    PT Problem List:   PT Treatment Interventions:     PT Goals Acute Rehab PT Goals PT Goal: Sit to Stand - Progress: Progressing toward goal PT Goal: Stand to Sit - Progress: Progressing toward goal PT Goal: Ambulate - Progress: Progressing toward goal  Visit Information  Last PT Received On:  08/20/11 Assistance Needed: +1    Subjective Data  Subjective: "I've got a RW and cane at home."   Cognition  Overall Cognitive Status: Appears within functional limits for tasks assessed/performed    Balance     End of Session PT - End of Session Activity Tolerance: Patient tolerated treatment well Patient left: in chair;with call bell/phone within reach    [facility] E 08/20/2011, 12:23 PM Pager: [phone]

## 2011-08-21 ENCOUNTER — Encounter (HOSPITAL_COMMUNITY): Payer: Self-pay | Admitting: *Deleted

## 2011-08-21 ENCOUNTER — Other Ambulatory Visit: Payer: Self-pay | Admitting: Urology

## 2011-08-21 NOTE — OR Nursing (Signed)
PREOP INSTRUCTIONS WERE GIVEN TO PT'S DAUGHTER SYBIL CELL # [id number]--PT JUST DISCHARGED FROM [facility] YESTERDAY 08/20/11 --HE WILL BE SAME DAY SURGERY ON 09/02/11.  PT TO [place] WITH BETASEPT SOAP THE NIGHT BEFORE SURGERY AND THE AM OF HIS SURGERY--BUT NOT TO USE IT ON HIS HEAD, FACE, PRIVATE AREAS.

## 2011-08-23 LAB — CULTURE, BLOOD (ROUTINE X 2)
Culture  Setup Time: 201305250214
Culture: NO GROWTH
Culture: NO GROWTH

## 2011-09-02 ENCOUNTER — Ambulatory Visit (HOSPITAL_COMMUNITY): Payer: Medicare Other

## 2011-09-02 ENCOUNTER — Encounter (HOSPITAL_COMMUNITY): Payer: Self-pay | Admitting: Anesthesiology

## 2011-09-02 ENCOUNTER — Encounter (HOSPITAL_COMMUNITY)
Admission: RE | Disposition: A | Discharge status: Home Health | Payer: Self-pay | Source: Ambulatory Visit | Attending: Urology

## 2011-09-02 ENCOUNTER — Observation Stay (HOSPITAL_COMMUNITY): Payer: Medicare Other

## 2011-09-02 ENCOUNTER — Inpatient Hospital Stay (HOSPITAL_COMMUNITY)
Admission: RE | Admit: 2011-09-02 | Discharge: 2011-09-05 | DRG: 660 | Disposition: A | Discharge status: Home Health | Payer: Medicare Other | Source: Ambulatory Visit | Attending: Urology | Admitting: Urology

## 2011-09-02 ENCOUNTER — Encounter (HOSPITAL_COMMUNITY): Payer: Self-pay | Admitting: *Deleted

## 2011-09-02 ENCOUNTER — Ambulatory Visit (HOSPITAL_COMMUNITY): Payer: Medicare Other | Admitting: Anesthesiology

## 2011-09-02 DIAGNOSIS — Z86718 Personal history of other venous thrombosis and embolism: Secondary | ICD-10-CM

## 2011-09-02 DIAGNOSIS — I359 Nonrheumatic aortic valve disorder, unspecified: Secondary | ICD-10-CM | POA: Diagnosis present

## 2011-09-02 DIAGNOSIS — N179 Acute kidney failure, unspecified: Secondary | ICD-10-CM | POA: Diagnosis not present

## 2011-09-02 DIAGNOSIS — R000 Tachycardia, unspecified: Secondary | ICD-10-CM | POA: Diagnosis present

## 2011-09-02 DIAGNOSIS — Z8551 Personal history of malignant neoplasm of bladder: Secondary | ICD-10-CM

## 2011-09-02 DIAGNOSIS — E875 Hyperkalemia: Secondary | ICD-10-CM | POA: Diagnosis not present

## 2011-09-02 DIAGNOSIS — N201 Calculus of ureter: Principal | ICD-10-CM | POA: Diagnosis present

## 2011-09-02 DIAGNOSIS — M109 Gout, unspecified: Secondary | ICD-10-CM | POA: Diagnosis present

## 2011-09-02 DIAGNOSIS — Z950 Presence of cardiac pacemaker: Secondary | ICD-10-CM

## 2011-09-02 DIAGNOSIS — N1330 Unspecified hydronephrosis: Secondary | ICD-10-CM | POA: Diagnosis present

## 2011-09-02 DIAGNOSIS — N200 Calculus of kidney: Secondary | ICD-10-CM | POA: Diagnosis present

## 2011-09-02 DIAGNOSIS — I129 Hypertensive chronic kidney disease with stage 1 through stage 4 chronic kidney disease, or unspecified chronic kidney disease: Secondary | ICD-10-CM | POA: Diagnosis present

## 2011-09-02 DIAGNOSIS — D72829 Elevated white blood cell count, unspecified: Secondary | ICD-10-CM | POA: Diagnosis present

## 2011-09-02 DIAGNOSIS — N183 Chronic kidney disease, stage 3 unspecified: Secondary | ICD-10-CM | POA: Diagnosis present

## 2011-09-02 DIAGNOSIS — Z79899 Other long term (current) drug therapy: Secondary | ICD-10-CM

## 2011-09-02 HISTORY — PX: NEPHROLITHOTOMY: SHX5134

## 2011-09-02 LAB — CREATININE, SERUM
Creatinine, Ser: 1.71 mg/dL — ABNORMAL HIGH (ref 0.50–1.35)
GFR calc non Af Amer: 34 mL/min — ABNORMAL LOW (ref >90)

## 2011-09-02 LAB — HEMOGLOBIN AND HEMATOCRIT, BLOOD
HCT: 35.9 % — ABNORMAL LOW (ref 39.0–52.0)
Hemoglobin: 12.1 g/dL — ABNORMAL LOW (ref 13.0–17.0)

## 2011-09-02 SURGERY — NEPHROLITHOTOMY PERCUTANEOUS
Anesthesia: General | Laterality: Left | Wound class: Clean Contaminated

## 2011-09-02 MED ORDER — CEFAZOLIN SODIUM 1-5 GM-% IV SOLN
1.0000 g | Freq: Three times a day (TID) | INTRAVENOUS | Status: DC
  Filled 2011-09-02 (×6): qty 50

## 2011-09-02 MED ORDER — ONDANSETRON HCL 4 MG/2ML IJ SOLN: 4.0000 mg | INTRAMUSCULAR | Status: DC | PRN

## 2011-09-02 MED ORDER — ACETAMINOPHEN 325 MG PO TABS: 650.0000 mg | ORAL_TABLET | ORAL | Status: DC | PRN

## 2011-09-02 MED ORDER — COLCHICINE 0.6 MG PO TABS
0.6000 mg | ORAL_TABLET | ORAL | Status: DC | PRN
  Filled 2011-09-02: qty 1

## 2011-09-02 MED ORDER — CEFAZOLIN SODIUM 1-5 GM-% IV SOLN
1.0000 g | Freq: Two times a day (BID) | INTRAVENOUS | Status: DC
  Filled 2011-09-02: qty 50

## 2011-09-02 MED ORDER — CIPROFLOXACIN IN D5W 400 MG/200ML IV SOLN
400.0000 mg | INTRAVENOUS | Status: AC
  Administered 2011-09-02: 400 mg via INTRAVENOUS

## 2011-09-02 MED ORDER — LACTATED RINGERS IV SOLN
INTRAVENOUS | Status: DC | PRN
  Administered 2011-09-02: 08:00:00 via INTRAVENOUS

## 2011-09-02 MED ORDER — AMLODIPINE BESYLATE 5 MG PO TABS
5.0000 mg | ORAL_TABLET | Freq: Every day | ORAL | Status: DC
  Administered 2011-09-02 – 2011-09-04 (×3): 5 mg via ORAL
  Filled 2011-09-02 (×5): qty 1

## 2011-09-02 MED ORDER — IOHEXOL 300 MG/ML  SOLN
INTRAMUSCULAR | Status: AC
  Filled 2011-09-02: qty 1

## 2011-09-02 MED ORDER — SUCCINYLCHOLINE CHLORIDE 20 MG/ML IJ SOLN
INTRAMUSCULAR | Status: DC | PRN
  Administered 2011-09-02: 100 mg via INTRAVENOUS

## 2011-09-02 MED ORDER — ONDANSETRON HCL 4 MG/2ML IJ SOLN
INTRAMUSCULAR | Status: DC | PRN
  Administered 2011-09-02: 4 mg via INTRAVENOUS

## 2011-09-02 MED ORDER — OCUVITE-LUTEIN PO CAPS
1.0000 | ORAL_CAPSULE | Freq: Every day | ORAL | Status: DC
  Filled 2011-09-02 (×2): qty 1

## 2011-09-02 MED ORDER — HYDROCODONE-ACETAMINOPHEN 5-325 MG PO TABS: 1.0000 | ORAL_TABLET | ORAL | Status: DC | PRN

## 2011-09-02 MED ORDER — PROPOFOL 10 MG/ML IV BOLUS
INTRAVENOUS | Status: DC | PRN
  Administered 2011-09-02: 100 mg via INTRAVENOUS

## 2011-09-02 MED ORDER — KCL IN DEXTROSE-NACL 20-5-0.45 MEQ/L-%-% IV SOLN
INTRAVENOUS | Status: DC
  Administered 2011-09-02: 75 mL/h via INTRAVENOUS
  Administered 2011-09-03: 10:00:00 via INTRAVENOUS
  Filled 2011-09-02 (×4): qty 1000

## 2011-09-02 MED ORDER — CISATRACURIUM BESYLATE (PF) 10 MG/5ML IV SOLN
INTRAVENOUS | Status: DC | PRN
  Administered 2011-09-02: 4 mg via INTRAVENOUS

## 2011-09-02 MED ORDER — CEFAZOLIN SODIUM 1-5 GM-% IV SOLN
1.0000 g | Freq: Three times a day (TID) | INTRAVENOUS | Status: DC
  Administered 2011-09-02 – 2011-09-03 (×3): 1 g via INTRAVENOUS
  Filled 2011-09-02 (×4): qty 50

## 2011-09-02 MED ORDER — FENTANYL CITRATE 0.05 MG/ML IJ SOLN
INTRAMUSCULAR | Status: DC | PRN
  Administered 2011-09-02 (×2): 50 ug via INTRAVENOUS

## 2011-09-02 MED ORDER — PROSIGHT PO TABS
1.0000 | ORAL_TABLET | Freq: Every day | ORAL | Status: DC
  Administered 2011-09-02 – 2011-09-04 (×3): 1 via ORAL
  Filled 2011-09-02 (×4): qty 1

## 2011-09-02 MED ORDER — PHENYLEPHRINE HCL 10 MG/ML IJ SOLN
INTRAMUSCULAR | Status: DC | PRN
  Administered 2011-09-02: 40 ug via INTRAVENOUS

## 2011-09-02 MED ORDER — HYDROMORPHONE HCL PF 1 MG/ML IJ SOLN
INTRAMUSCULAR | Status: AC
  Filled 2011-09-02: qty 1

## 2011-09-02 MED ORDER — HYDROMORPHONE HCL PF 1 MG/ML IJ SOLN
0.2500 mg | INTRAMUSCULAR | Status: DC | PRN
  Administered 2011-09-02 (×2): 0.25 mg via INTRAVENOUS

## 2011-09-02 MED ORDER — HYDROMORPHONE HCL PF 1 MG/ML IJ SOLN
0.5000 mg | INTRAMUSCULAR | Status: DC | PRN
  Administered 2011-09-02 – 2011-09-04 (×5): 1 mg via INTRAVENOUS
  Filled 2011-09-02 (×5): qty 1

## 2011-09-02 MED ORDER — PROMETHAZINE HCL 25 MG/ML IJ SOLN: 6.2500 mg | INTRAMUSCULAR | Status: DC | PRN

## 2011-09-02 MED ORDER — BISACODYL 10 MG RE SUPP
10.0000 mg | Freq: Every day | RECTAL | Status: DC | PRN
  Filled 2011-09-02: qty 1

## 2011-09-02 MED ORDER — IOHEXOL 300 MG/ML  SOLN
INTRAMUSCULAR | Status: DC | PRN
  Administered 2011-09-02: 50 mL

## 2011-09-02 MED ORDER — FEBUXOSTAT 40 MG PO TABS
40.0000 mg | ORAL_TABLET | Freq: Every day | ORAL | Status: DC
  Administered 2011-09-02 – 2011-09-04 (×3): 40 mg via ORAL
  Filled 2011-09-02 (×4): qty 1

## 2011-09-02 MED ORDER — ZOLPIDEM TARTRATE 5 MG PO TABS: 5.0000 mg | ORAL_TABLET | Freq: Every evening | ORAL | Status: DC | PRN

## 2011-09-02 MED ORDER — PHENYLEPHRINE HCL 10 MG/ML IJ SOLN: INTRAMUSCULAR | Status: DC | PRN

## 2011-09-02 MED ORDER — CIPROFLOXACIN IN D5W 400 MG/200ML IV SOLN
INTRAVENOUS | Status: AC
  Filled 2011-09-02: qty 200

## 2011-09-02 MED ORDER — SODIUM CHLORIDE 0.9 % IR SOLN
Status: DC | PRN
  Administered 2011-09-02: 6000 mL

## 2011-09-02 MED ORDER — CEFAZOLIN SODIUM 1-5 GM-% IV SOLN
1.0000 g | INTRAVENOUS | Status: AC
  Administered 2011-09-02: 1 g via INTRAVENOUS

## 2011-09-02 SURGICAL SUPPLY — 44 items
APL SKNCLS STERI-STRIP NONHPOA (GAUZE/BANDAGES/DRESSINGS) ×1
BAG URINE DRAINAGE (UROLOGICAL SUPPLIES) ×2 IMPLANT
BASKET ZERO TIP NITINOL 2.4FR (BASKET) ×1 IMPLANT
BENZOIN TINCTURE PRP APPL 2/3 (GAUZE/BANDAGES/DRESSINGS) ×5 IMPLANT
BLADE SURG 15 STRL LF DISP TIS (BLADE) ×1 IMPLANT
BLADE SURG 15 STRL SS (BLADE) ×2
BSKT STON RTRVL ZERO TP 2.4FR (BASKET)
CATCHER STONE W/TUBE ADAPTER (UROLOGICAL SUPPLIES) ×1 IMPLANT
CATH COUNCIL 22FR (CATHETERS) ×1 IMPLANT
CATH FOLEY 2W COUNCIL 20FR 5CC (CATHETERS) IMPLANT
CATH ROBINSON RED A/P 20FR (CATHETERS) IMPLANT
CATH X-FORCE N30 NEPHROSTOMY (TUBING) ×2 IMPLANT
CLOTH BEACON ORANGE TIMEOUT ST (SAFETY) ×2 IMPLANT
COVER SURGICAL LIGHT HANDLE (MISCELLANEOUS) ×2 IMPLANT
DRAPE C-ARM 42X72 X-RAY (DRAPES) ×2 IMPLANT
DRAPE CAMERA CLOSED 9X96 (DRAPES) ×2 IMPLANT
DRAPE LINGEMAN PERC (DRAPES) ×2 IMPLANT
DRAPE SURG IRRIG POUCH 19X23 (DRAPES) ×2 IMPLANT
DRSG TEGADERM 8X12 (GAUZE/BANDAGES/DRESSINGS) ×4 IMPLANT
GLOVE SURG SS PI 8.0 STRL IVOR (GLOVE) ×5 IMPLANT
GOWN STRL REIN XL XLG (GOWN DISPOSABLE) ×3 IMPLANT
GUIDEWIRE STR DUAL SENSOR (WIRE) ×1 IMPLANT
KIT BASIN OR (CUSTOM PROCEDURE TRAY) ×2 IMPLANT
LASER FIBER DISP (UROLOGICAL SUPPLIES) IMPLANT
LASER FIBER DISP 1000U (UROLOGICAL SUPPLIES) IMPLANT
MANIFOLD NEPTUNE II (INSTRUMENTS) ×2 IMPLANT
NS IRRIG 1000ML POUR BTL (IV SOLUTION) ×2 IMPLANT
PACK BASIC VI WITH GOWN DISP (CUSTOM PROCEDURE TRAY) ×2 IMPLANT
PAD ABD 7.5X8 STRL (GAUZE/BANDAGES/DRESSINGS) ×4 IMPLANT
PROBE LITHOCLAST ULTRA 3.8X403 (UROLOGICAL SUPPLIES) ×1 IMPLANT
PROBE PNEUMATIC 1.0MMX570MM (UROLOGICAL SUPPLIES) ×1 IMPLANT
SET IRRIG Y TYPE TUR BLADDER L (SET/KITS/TRAYS/PACK) ×1 IMPLANT
SET WARMING FLUID IRRIGATION (MISCELLANEOUS) ×2 IMPLANT
SHEATH ACCESS URETERAL 24CM (SHEATH) ×1 IMPLANT
SPONGE GAUZE 4X4 12PLY (GAUZE/BANDAGES/DRESSINGS) ×2 IMPLANT
SPONGE LAP 4X18 X RAY DECT (DISPOSABLE) ×2 IMPLANT
STONE CATCHER W/TUBE ADAPTER (UROLOGICAL SUPPLIES) IMPLANT
SUT SILK 2 0 30  PSL (SUTURE) ×1
SUT SILK 2 0 30 PSL (SUTURE) ×1 IMPLANT
SYR 20CC LL (SYRINGE) ×4 IMPLANT
SYRINGE 10CC LL (SYRINGE) ×2 IMPLANT
TOWEL OR NON WOVEN STRL DISP B (DISPOSABLE) ×2 IMPLANT
TRAY FOLEY CATH 14FRSI W/METER (CATHETERS) ×1 IMPLANT
TUBING CONNECTING 10 (TUBING) ×4 IMPLANT

## 2011-09-02 NOTE — Op Note (Signed)
NAMEMarland Kitchen  [name], [name] NO.:  [id number]  MEDICAL RECORD NO.:  [id number]  LOCATION:  [id number]                         FACILITY:  [facility]  PHYSICIAN:  [name] [name], M.D.    DATE OF BIRTH:  04/03/1921  DATE OF PROCEDURE:  09/02/2011 DATE OF DISCHARGE:                              OPERATIVE REPORT   PROCEDURE:  Left percutaneous nephroscopy with removal of clots and left renal pelvic tissue, left renal stone not found.  PREOPERATIVE DIAGNOSIS:  Less than 2 cm left UPJ stone and lower pole stone.  POSTOPERATIVE DIAGNOSIS:  Less than 2 cm left UPJ stone and lower pole stone, but the stone is no longer apparent.  SURGEON:  [name] [name], M.D.  ANESTHESIA:  General.  SPECIMEN:  Small tissue from the renal pelvis.  DRAINS:  Twenty-two-French nephrostomy tube and 6-French Kumpe catheter.  BLOOD LOSS:  100 mL.  COMPLICATIONS:  None.  INDICATIONS:  [name] [name] is an 78 year old white male with a history of bladder cancer and  prior cystectomy.  He was recently admitted with an obstructing approximately 1.3 cm left UPJ stone with sepsis and a percutaneous nephrostomy tube was placed.  On CT, he also had a stone in the lower pole of the left kidney.  Access was placed to the lower pole. The stones were not apparent on plain film.  It was felt that left percutaneous nephrolithotomy was indicated.  FINDINGS OF PROCEDURE:  The patient was taken to the operating room where he received Ancef.  A general anesthetic was induced on the recovery room stretcher.  He was then rolled prone on the operating table on chest rolls with care being taken to pad all pressure points. He was fitted with PAS hose.  His left nephrostomy tube site was prepped and draped in the usual sterile fashion.  The nephrostomy tube was removed over a wire.  The wire was left coiled in the kidney and a 6-French, Kumpe wire catheter was then passed over the wire and used to direct the wire into the  ureter where was it advanced to the ureterovesical anastomosis.  At this point, a 10-French peel-away sheath was placed over the wire and placed into the proximal ureter.  The inner sheath was removed and a 2nd wire was placed into the ureter.  At this point, the incision was extended and the Trackmaster balloon was placed over the working wire and inflated to 18 atmospheres.  The nephrostomy sheath was then placed over the balloon into the renal pelvis, and the balloon was removed.  A 24-French rigid nephroscope was then passed through the sheath and the collecting system was inspected.  There was a moderate amount of fresh clot, which was removed and there was some old clot, but I saw no evidence of stones in the renal pelvis or in the lower pole calyx where there had been a filling defect on the initial nephrostogram.  At this point, the flexible nephroscope was then passed once again without evidence of stones in the renal pelvis or lower pole collecting system.  I was unable to negotiate the flexible scope into the proximal ureter.  So at this point I placed a 2nd wire  down the ureter again with the aid of a Kumpe catheter, and then inserted a 28 cm digital access sheath into the proximal ureter.  The inner core and wire were removed, and the digital ureteroscope was then passed through the sheath into the ureter. The ureter was inspected from the ureteroileal anastomosis all the way to the renal pelvis and no evidence of stone was seen.  At this point, the ureteroscope was used to inspect the lower pole and renal pelvis once again without evidence of stone being identified.  A nephrostogram was performed.  The upper pole filled, no evidence of filling defects were noted in the upper pole, which was slightly dilated.  At this point, the rigid nephroscope was reinserted and further inspection with great effort being obtained to visualize the entire lower pole calyceal  complex by backing the sheath out to the level of the parenchyma revealed no evidence of stones.  More clots were removed, with the clots there was a small amount of renal pelvic tissue, I sent this for pathologic analysis just to make sure we are not dealing with a neoplasm as the cause of the filling defect, however, none of the nephrostograms during the procedure showed anything that suggested a filling defect consistent with the stone.  At this point, a 22-French Councill catheter was placed through the access sheath into the renal pelvis.  The access sheath was backed out and the Councill balloon was filled with 2-3 mL of fluid.  A nephrostogram was performed once again, which revealed no extravasation except along the tract, and no obvious filling defects worrisome for residual stone material.  At this point, the access sheath was backed out and the 6-French Kumpe catheter was reinserted over the safety wire to the distal ureter and the wire was removed.  Both the nephrostomy catheter and the safety catheter were then secured to the skin with a 2-0 silk suture.  The nephrostomy catheter was placed to straight drainage.  The Kumpe catheter was capped.  The drapes were removed.  A dressing was applied. The patient was rolled back prone on the recovery room stretcher and his anesthetic was reversed.  He was moved to recovery room in stable condition.  There were no complications.    [name] [name], M.D.    JJW/MEDQ  D:  09/02/2011  T:  09/02/2011  Job:  [id number]

## 2011-09-02 NOTE — Brief Op Note (Signed)
09/02/2011  10:30 AM  PATIENT:  [name]  78 y.o. male  PRE-OPERATIVE DIAGNOSIS:  left pelvic junction stone  POST-OPERATIVE DIAGNOSIS:  left ureteral pelvic junction stone no longer apparent.  PROCEDURE:  Procedure(s) (LRB): NEPHROLITHOTOMY PERCUTANEOUS (Left)  SURGEON:  Surgeon(s) and Role:    * Malka So, MD - Primary  PHYSICIAN ASSISTANT:   ASSISTANTS: none   ANESTHESIA:   general  EBL:  Total I/O In: 600 [I.V.:600] Out: 700 [Urine:600; Blood:100]  BLOOD ADMINISTERED:none  DRAINS: 25fr left nephrostomy tube and 6 fr Kompy catheter   LOCAL MEDICATIONS USED:  NONE  SPECIMEN:  Source of Specimen:  left renal pelvic mucosa.  DISPOSITION OF SPECIMEN:  PATHOLOGY  COUNTS:  YES  TOURNIQUET:  * No tourniquets in log *  DICTATION: .Other Dictation: Dictation Number [id number]  PLAN OF CARE: Admit for overnight observation  PATIENT DISPOSITION:  PACU - hemodynamically stable.   Delay start of Pharmacological VTE agent (>24hrs) due to surgical blood loss or risk of bleeding: yes

## 2011-09-02 NOTE — Anesthesia Postprocedure Evaluation (Signed)
  Anesthesia Post-op Note  Patient: [name] [name]  Procedure(s) Performed: Procedure(s) (LRB): NEPHROLITHOTOMY PERCUTANEOUS (Left)  Patient Location: PACU  Anesthesia Type: General  Level of Consciousness: awake and alert   Airway and Oxygen Therapy: Patient Spontanous Breathing  Post-op Pain: mild  Post-op Assessment: Post-op Vital signs reviewed, Patient's Cardiovascular Status Stable, Respiratory Function Stable, Patent Airway and No signs of Nausea or vomiting  Post-op Vital Signs: stable  Complications: No apparent anesthesia complications

## 2011-09-02 NOTE — Progress Notes (Signed)
Patient ID: [name] [name], male   DOB: April 17, 1921, 78 y.o.   MRN: [id number]    He is doing well post op.  There is minimal bloody urine in the nephrostomy bag.   He is to have a CT in the AM to assess for residual stones since I was unable to locate any today.

## 2011-09-02 NOTE — Transfer of Care (Signed)
Immediate Anesthesia Transfer of Care Note  Patient: [name] [name]  Procedure(s) Performed: Procedure(s) (LRB): NEPHROLITHOTOMY PERCUTANEOUS (Left)  Patient Location: PACU  Anesthesia Type: General  Level of Consciousness: awake, alert , oriented and patient cooperative  Airway & Oxygen Therapy: Patient Spontanous Breathing and Patient connected to face mask oxygen  Post-op Assessment: Report given to PACU RN and Post -op Vital signs reviewed and stable  Post vital signs: Reviewed and stable  Complications: No apparent anesthesia complications

## 2011-09-02 NOTE — Progress Notes (Signed)
PHARMACIST - PHYSICIAN COMMUNICATION REGARDING:  Pharmacy consult for renal adjustment of antibiotics  Patient with obstructing left proximal ureteral stone with CKD and probable sepsis, started on Ancef 1g IV q8h.  Pt is now s/p percutaneous nephrolithotomy (6/10) and plan is to continue Ancef for treatment of infection.  STAT SCr 1.71 (CrCl~ 31 ml/min).    Plan: CrCl is borderline for Ancef adjustment to q12h dosing.  Continue Ancef 1g IV q8h today and will continue to f/u SCr in AM for possible dose adjustment.  Hershal Coria, PharmD, BCPS Pager: [phone] 09/02/2011 2:16 PM

## 2011-09-02 NOTE — Interval H&P Note (Signed)
History and Physical Interval Note:  09/02/2011 11:03 AM  [name] [name]  has presented today for surgery, with the diagnosis of left pelvic junction stone  The various methods of treatment have been discussed with the patient and family. After consideration of risks, benefits and other options for treatment, the patient has consented to  Procedure(s) (LRB): NEPHROLITHOTOMY PERCUTANEOUS (Left) as a surgical intervention .  The patients' history has been reviewed, patient examined, no change in status, stable for surgery.  I have reviewed the patients' chart and labs.  Questions were answered to the patient's satisfaction.     Danh Bayus J

## 2011-09-02 NOTE — H&P (Signed)
Reason for Consult:Obstructing left renal stone with sepsis.  Referring Physician: Dr. Jola Schmidt  [name] [name] is an 78 y.o. male.  HPI: [name] [name] is well known to me. He has a history of bladder cancer with prior cystoprostatectomy with urethrectomy and ileal conduit several years ago. He has a history of stones and had a previous right PCNL for an obstructing stone. He last saw me about 4 months ago and he had a 55mm LLQ renal stone at that time. He presents to the ER now with the onset Weds evening of nausea and LUQ pain. He was found on CT today to have a 21mm obstructing left proximal ureteral stone with smaller renal stones. He had an elevated WBC as well as a Cr of 4. I spoke with Dr. Venora Maples over the phone this morning and recommended a Left perc tube which was placed without difficulty. He now has a fever to 102 rectally with rigors post tube placement. He is has received Rocephin and will be admitted to the hospitalist service.  Past Medical History   Diagnosis  Date   .  Hypertension    .  Aortic valve stenosis    .  Gout    .  Hx of bladder cancer    .  Deep venous thrombosis      hx of   .  Prostate cancer    .  Osteoporosis    .  Renal failure    .  Heart murmur    .  Depression    .  Chronic kidney disease    .  Kidney stone     Past Surgical History   Procedure  Date   .  Insert / replace / remove pacemaker    .  Cystectomy    .  Lymphadenectomy    .  Prostate surgery    .  Appendectomy    .  Cataract extraction, bilateral    .  Kidney stone surgery    .  Ilial conduit      for bladder cancer    Family History   Problem  Relation  Age of Onset   .  Prostate cancer  Father  52      died    .  Pancreatic cancer  Mother  37      died    Social History: reports that he quit smoking about 59 years ago. His smoking use included Cigarettes. He has never used smokeless tobacco. He reports that he does not drink alcohol or use illicit drugs.    Allergies: No Known Allergies  Medications: I have reviewed the patient's current medications.  Prior to Admission:  (Not in a hospital admission)  Results for orders placed during the hospital encounter of 08/16/11 (from the past 48 hour(s))   CBC Status: Abnormal    Collection Time    08/16/11 8:00 AM   Component  Value  Range  Comment    WBC  22.9 (*)  4.0 - 10.5 (K/uL)     RBC  4.30  4.22 - 5.81 (MIL/uL)     Hemoglobin  13.4  13.0 - 17.0 (g/dL)     HCT  41.1  39.0 - 52.0 (%)     MCV  95.6  78.0 - 100.0 (fL)     MCH  31.2  26.0 - 34.0 (pg)     MCHC  32.6  30.0 - 36.0 (g/dL)     RDW  14.0  11.5 -  15.5 (%)     Platelets  176  150 - 400 (K/uL)    BASIC METABOLIC PANEL Status: Abnormal    Collection Time    08/16/11 8:00 AM   Component  Value  Range  Comment    Sodium  140  135 - 145 (mEq/L)     Potassium  4.2  3.5 - 5.1 (mEq/L)     Chloride  100  96 - 112 (mEq/L)     CO2  25  19 - 32 (mEq/L)     Glucose, Bld  118 (*)  70 - 99 (mg/dL)     BUN  52 (*)  6 - 23 (mg/dL)     Creatinine, Ser  4.18 (*)  0.50 - 1.35 (mg/dL)     Calcium  8.8  8.4 - 10.5 (mg/dL)     GFR calc non Af Amer  12 (*)  >90 (mL/min)     GFR calc Af Amer  13 (*)  >90 (mL/min)    PROTIME-INR Status: Abnormal    Collection Time    08/16/11 10:15 AM   Component  Value  Range  Comment    Prothrombin Time  15.7 (*)  11.6 - 15.2 (seconds)     INR  1.22  0.00 - 1.49    URINALYSIS, ROUTINE W REFLEX MICROSCOPIC Status: Abnormal    Collection Time    08/16/11 10:20 AM   Component  Value  Range  Comment    Color, Urine  YELLOW  YELLOW     APPearance  CLEAR  CLEAR     Specific Gravity, Urine  1.015  1.005 - 1.030     pH  6.5  5.0 - 8.0     Glucose, UA  NEGATIVE  NEGATIVE (mg/dL)     Hgb urine dipstick  LARGE (*)  NEGATIVE     Bilirubin Urine  NEGATIVE  NEGATIVE     Ketones, ur  NEGATIVE  NEGATIVE (mg/dL)     Protein, ur  30 (*)  NEGATIVE (mg/dL)     Urobilinogen, UA  0.2  0.0 - 1.0 (mg/dL)     Nitrite  NEGATIVE   NEGATIVE     Leukocytes, UA  MODERATE (*)  NEGATIVE    URINE MICROSCOPIC-ADD ON Status: Abnormal    Collection Time    08/16/11 10:20 AM   Component  Value  Range  Comment    Squamous Epithelial / LPF  RARE  RARE     WBC, UA  11-20  <3 (WBC/hpf)     RBC / HPF  7-10  <3 (RBC/hpf)     Bacteria, UA  FEW (*)  RARE     Ct Abdomen Pelvis Wo Contrast  08/16/2011 *RADIOLOGY REPORT* Clinical Data: Left flank pain and history of bilateral renal calculi including prior right nephrolithotomy on 07/10/2010. History of bladder carcinoma with prior cystectomy and ileal conduit formation. CT ABDOMEN AND PELVIS WITHOUT CONTRAST Technique: Multidetector CT imaging of the abdomen and pelvis was performed following the standard protocol without intravenous contrast. Comparison: 05/09/2010 Findings: There is evidence of moderately severe left-sided hydronephrosis secondary to an obstructing calculus at the ureteropelvic junction measuring approximately 5 x 9 x 12 mm. Nonobstructing lower pole calculi are present measuring approximately 3 mm and 5 mm in respective greatest diameter. No other left-sided calculi or calcifications in the ileal conduit are identified. The ileal conduit is nondistended and shows no gross abnormalities at the level of a right lower quadrant ileostomy. There is a  tiny nonobstructing calculus in the lower pole of the right kidney. The right kidney is relatively atrophic compared to the left. Stable renal cyst in the posterior right kidney. Stable gallstones. No abnormal fluid collections. Other unenhanced solid organs and bowel are unremarkable. Stable left inguinal hernia containing fat. IMPRESSION: Moderately severe left hydronephrosis secondary to an obstructing calculus at the UPJ measuring 12 mm in greatest dimensions. Other nonobstructing calculi are present bilaterally. Original Report Authenticated By: Azzie Roup, M.D.   Review of Systems  Constitutional: Positive for fever and  chills.  Gastrointestinal: Positive for nausea and abdominal pain.  All other systems reviewed and are negative.   Blood pressure 155/70, pulse 75, temperature 97.9 F (36.6 C), temperature source Oral, resp. rate 24, SpO2 100.00%.  Physical Exam  Constitutional: He is oriented to person, place, and time. He appears well-developed and well-nourished.  Cardiovascular:  He is tachycardic  Respiratory: Effort normal and breath sounds normal.  With tachypnea.  GI:  Soft with left upper quadrant and CVA tenderness with some guarding. He has a stoma in the RLQ.  Musculoskeletal: He exhibits no edema.  Neurological: He is alert and oriented to person, place, and time.  Skin: Skin is warm and dry.  Psychiatric: He has a normal mood and affect.   Assessment/Plan:  He has an obstructing left proximal ureteral stone with acute on chronic renal insufficiency and probable sepsis.  He will need to be treated for his infection and once he has recovered from that, he will need a left percutaneous nephrolithotomy. I will follow while in the hospital.

## 2011-09-02 NOTE — Discharge Instructions (Signed)
Percutaneous Nephrolithotomy Kidney stones can cause a great deal of pain. They can block urine from leaving the body. And they can lead to infection. Kidney stones might pass on their own, or they can be broken up by shock waves from a special machine. But sometimes surgery is needed to get rid of kidney stones. One type of surgery is called percutaneous nephrolithotomy. "Nephro" means kidney, "litho" means stone and "tomy" means removal by surgery. "Percutaneous" means through the skin. This type of surgery needs only a small cut (incision) in the skin. This surgery may be suggested for various reasons. They include:  The kidney stones are 2 centimeters wide (about 3/4 inch) or bigger. They also might be oddly shaped.   Other treatments were tried, but all or some of the kidney stones remain.   Infection has developed.   No other treatment can be used.  LET YOUR CAREGIVER KNOW ABOUT:   Any allergies.   All medications you are taking, including:   Herbs, eyedrops, over-the-counter medications and creams.   Blood thinners (anticoagulants), aspirin or other drugs that could affect blood clotting.   Use of steroids (by mouth or as creams).   Previous problems with anesthetics, including local anesthetics.   Possibility of pregnancy, if this applies.   Any history of blood clots.   Any history of bleeding or other blood problems.   Previous surgery.   Smoking history.   Other health problems.  RISKS AND COMPLICATIONS   During surgery:   Sometimes all the kidney stones cannot be removed through the tube. Then, the percutaneous nephrolithotomy procedure would be stopped. Open surgery would be used to remove the remaining stones.   Short-term risks from the surgery could include:   Excessive bleeding.   Blood in your urine.   Holes in the kidney. These usually heal on their own.   Pain.   Redness or tenderness at the incision site.   Numbness (loss of feeling) in the  area treated. Tingling also is possible.   A pooling of blood in the wound (hematoma).   Infection.   Slow healing.   Longer-term possibilities include:   Kidney damage.   Damage to organs near the kidney.   Need for a repeat surgery.  BEFORE THE PROCEDURE  You may need to take some tests before your surgery. These might include:   Blood tests.   Urine tests.   Tests to make sure your heart is working properly.   Let your healthcare provider know if you think you might have a urinary tract infection. You will probably need to take an antibiotic to treat this before the surgery.   Two weeks before your surgery, stop using aspirin and non-steroidal anti-inflammatory drugs (NSAIDs) for pain relief. This includes prescription drugs and over-the-counter drugs such as ibuprofen and naproxen. Also stop taking vitamin E.   If you take blood-thinners, ask your healthcare provider when you should stop taking them.   Do not eat or drink for about 8 hours before your surgery.   You might be asked to shower or wash with a special antibacterial soap before the procedure.   Arrive at least an hour before the surgery, or whenever your surgeon recommends. This will give you time to check in and fill out any needed paperwork.  PROCEDURE  The preparation:   You will change into a hospital gown.   You will be given an IV. A needle will be inserted in your arm. Medication will be able  to flow directly into your body through this needle.   You might be given a sedative. This medication will help you relax.   You may be given a general anesthetic (a drug that will put you to sleep during the surgery). Or, you may get a local anesthetic (part of your body will be numb, but you will remain awake).   A catheter (tube) will be put in your bladder to drain urine during and after surgery.   The procedure:   The surgeon will make a small incision in your lower back.   A tube will be inserted  through the incision into your kidney.   Each kidney stone is removed through this tube. Larger stones may first be broken up with a laser (a high-intensity light beam) or other tools.   If a kidney stone has already left the kidney, the surgeon would use a special tool to bring it back in. Then it would be removed through the tube.   After all the stones are taken out, a catheter will be put in. Fluid can build up around the kidney as it heals. The catheter lets this fluid drain out of the body.   A dressing (medicine and bandage) will be put on the incision area.   The surgery usually takes three to four hours.  AFTER THE PROCEDURE  You will stay in a recovery area until the anesthesia has worn off. Your blood pressure and pulse will be checked often. You might be given more pain medication.   You will be moved to a hospital room for the rest of your stay.   The catheter that is taking urine out of your body will be taken out within 24 hours.   The day after your surgery, you should be able to walk around. Walking helps prevent blood clots (thick clumps that can block the flow of blood).   You may be asked to do some breathing exercises.   You will be able to have only liquids for a day or two.   Before you go home:   The catheter that is draining fluid from the kidney area is usually taken out.   You will be taught how to care for the incision. Be sure to ask how often the dressing should be changed and when it can get wet.   You also will be told what you should and should not do while your kidney and incisions heal. For instance, you may be urged to walk to prevent blood clots.  Document Released: 01/06/2009 Document Revised: 02/28/2011 Document Reviewed: 01/06/2009 [facility] Patient Information 2012 [place].

## 2011-09-02 NOTE — Anesthesia Preprocedure Evaluation (Addendum)
Anesthesia Evaluation  Patient identified by MRN, date of birth, ID band Patient awake    Reviewed: Allergy & Precautions, H&P , NPO status , Patient's Chart, lab work & pertinent test results  Airway Mallampati: II TM Distance: >3 FB Neck ROM: Full    Dental No notable dental hx.    Pulmonary neg pulmonary ROS,  CXR: NAD breath sounds clear to auscultation  Pulmonary exam normal       Cardiovascular hypertension, Pt. on medications + dysrhythmias + pacemaker + Valvular Problems/Murmurs AS Rhythm:Regular Rate:Normal  ECG: Atrial paced rhythm with prolonged AV conduction. Pacemaker for Sinus Bradycardia. Pacemaker checked 06/13/11. Reviewed Dr. Tanna Furry pacemaker prescription. Magnet will be applied.   Neuro/Psych PSYCHIATRIC DISORDERS Depression negative neurological ROS     GI/Hepatic negative GI ROS, Neg liver ROS,   Endo/Other  negative endocrine ROS  Renal/GU Renal InsufficiencyRenal diseaseCr 1.91  negative genitourinary   Musculoskeletal negative musculoskeletal ROS (+)   Abdominal   Peds negative pediatric ROS (+)  Hematology negative hematology ROS (+)   Anesthesia Other Findings   Reproductive/Obstetrics negative OB ROS                        Anesthesia Physical Anesthesia Plan  ASA: II  Anesthesia Plan: General   Post-op Pain Management:    Induction: Intravenous  Airway Management Planned: Oral ETT  Additional Equipment:   Intra-op Plan:   Post-operative Plan: Extubation in OR  Informed Consent: I have reviewed the patients History and Physical, chart, labs and discussed the procedure including the risks, benefits and alternatives for the proposed anesthesia with the patient or authorized representative who has indicated his/her understanding and acceptance.   Dental advisory given  Plan Discussed with: CRNA  Anesthesia Plan Comments:         Anesthesia Quick  Evaluation

## 2011-09-02 NOTE — Preoperative (Signed)
Beta Blockers   Reason not to administer Beta Blockers:Not Applicable 

## 2011-09-03 ENCOUNTER — Ambulatory Visit (HOSPITAL_COMMUNITY): Payer: Medicare Other

## 2011-09-03 LAB — BASIC METABOLIC PANEL
BUN: 36 mg/dL — ABNORMAL HIGH (ref 6–23)
CO2: 24 mEq/L (ref 19–32)
Chloride: 101 mEq/L (ref 96–112)
Creatinine, Ser: 2.38 mg/dL — ABNORMAL HIGH (ref 0.50–1.35)
Glucose, Bld: 113 mg/dL — ABNORMAL HIGH (ref 70–99)

## 2011-09-03 MED ORDER — CEFAZOLIN SODIUM 1-5 GM-% IV SOLN
1.0000 g | Freq: Two times a day (BID) | INTRAVENOUS | Status: DC
  Administered 2011-09-03 – 2011-09-04 (×3): 1 g via INTRAVENOUS
  Filled 2011-09-03 (×4): qty 50

## 2011-09-03 MED ORDER — SODIUM CHLORIDE 0.9 % IV SOLN
INTRAVENOUS | Status: DC
  Administered 2011-09-03: 50 mL/h via INTRAVENOUS

## 2011-09-03 MED ORDER — SODIUM POLYSTYRENE SULFONATE 15 GM/60ML PO SUSP
15.0000 g | Freq: Once | ORAL | Status: AC
  Administered 2011-09-03: 15 g via ORAL
  Filled 2011-09-03: qty 60

## 2011-09-03 NOTE — Care Management Note (Signed)
    Page 1 of 1   09/05/2011     12:50:57 PM   CARE MANAGEMENT NOTE 09/05/2011  Patient:  [name] [name], [name] [name]   Account Number:  [id number]  Date Initiated:  09/03/2011  Documentation initiated by:  Dessa Phi  Subjective/Objective Assessment:   ADMITTED W/L RENAL STONE.     Action/Plan:   FROM HOME   Anticipated DC Date:  09/05/2011   Anticipated DC Plan:  [place]  CM consult      Choice offered to / List presented to:             Status of service:  Completed, signed off Medicare Important Message given?   (If response is "NO", the following Medicare IM given date fields will be blank) Date Medicare IM given:   Date Additional Medicare IM given:    Discharge Disposition:  HOME/SELF CARE  Per UR Regulation:  Reviewed for med. necessity/level of care/duration of stay  If discussed at [place] of Stay Meetings, dates discussed:    Comments:  09/03/11 Aristotelis Vilardi RN,BSN NCM [place].

## 2011-09-03 NOTE — Progress Notes (Signed)
PHARMACIST - PHYSICIAN COMMUNICATION REGARDING:  Pharmacy consult for renal adjustment of antibiotics  Patient with obstructing left proximal ureteral stone (now s/p percutaneous nephrolithotomy) with probable sepsis, on day #2 Ancef 1g IV q8h.  SCr bumped overnight to 2.56 (6/11) < 1.71 (6/10), warranting a dose adjustment to Ancef.  Plan: Reduce Ancef to 1g q12h.  Hershal Coria, PharmD, BCPS Pager: [phone] 09/03/2011 9:57 AM

## 2011-09-03 NOTE — Progress Notes (Signed)
1 Day Post-Op  Subjective: [name] [name] is doing well with minimal pain.  He is tolerating a regular diet.  The urine in his bag and ostomy bag are clear red without clots.   A CT Abdomen without contrast this am shows no stone material in the kidney or proximal ureter.  The more distal ureter and conduit are not visualized.   I didn't see stone material in the ureter down to what I thought was the ureteroileal anastomosis on antegrade ureteroscopy yesterday.   There is perinephric fluid around the kidney which could be blood or urine and there is hypoechogenicity of the central kidney.   His Cr is up to 2.56 today and his Hgb has fallen to 9.9 from 12.1 preop.  Objective: Vital signs in last 24 hours: Temp:  [96.9 F (36.1 C)-98.6 F (37 C)] 98.6 F (37 C) (06/11 0439) Pulse Rate:  [75-76] 75  (06/11 0439) Resp:  [13-19] 16  (06/11 0439) BP: (116-167)/(57-82) 124/58 mmHg (06/11 0439) SpO2:  [92 %-100 %] 92 % (06/11 0439) Last BM Date: 09/01/11  Intake/Output from previous day: 06/10 0701 - 06/11 0700 In: 1852.5 [P.O.:480; I.V.:1272.5; IV Piggyback:100] Out: GJ:[id number]; Blood:100] Intake/Output this shift:    General appearance: alert and no distress Resp: clear to auscultation bilaterally Cardio: regular rate and rhythm and with SEM GI: Soft flat with mild left tenderness.    Lab Results:   Basename 09/03/11 0453 09/02/11 1105  WBC -- --  HGB 9.9* 12.1*  HCT 30.4* 35.9*  PLT -- --   BMET  Basename 09/03/11 0453 09/02/11 1225  NA -- --  K -- --  CL -- --  CO2 -- --  GLUCOSE -- --  BUN -- --  CREATININE 2.56* 1.71*  CALCIUM -- --   PT/INR No results found for this basename: LABPROT:2,INR:2 in the last 72 hours ABG No results found for this basename: PHART:2,PCO2:2,PO2:2,HCO3:2 in the last 72 hours  Studies/Results: Dg Abd 1 View  09/02/2011  *RADIOLOGY REPORT*  Clinical Data: Left ureteral pelvic junction stone.  ABDOMEN - 1 VIEW  Comparison: None.   Findings: Single C-arm image demonstrates a guide wire in the left renal collecting system as well as a drainage catheter.  There is no visible stone on the available image.  IMPRESSION: Left ureteral pelvic junction stone no longer apparent.  Original Report Authenticated By: Larey Seat, M.D.   Dg C-arm 1-60 Min-no Report  09/02/2011  CLINICAL DATA: kidney stone   C-ARM 1-60 MINUTES  Fluoroscopy was utilized by the requesting physician.  No radiographic  interpretation.      Anti-infectives: Anti-infectives     Start     Dose/Rate Route Frequency Ordered Stop   09/02/11 2200   ceFAZolin (ANCEF) IVPB 1 g/50 mL premix  Status:  Discontinued        1 g 100 mL/hr over 30 Minutes Intravenous Every 12 hours 09/02/11 1211 09/02/11 1417   09/02/11 1430   ceFAZolin (ANCEF) IVPB 1 g/50 mL premix        1 g 100 mL/hr over 30 Minutes Intravenous 3 times per day 09/02/11 1417     09/02/11 1400   ceFAZolin (ANCEF) IVPB 1 g/50 mL premix  Status:  Discontinued        1 g 100 mL/hr over 30 Minutes Intravenous 3 times per day 09/02/11 1144 09/02/11 1211   09/02/11 0700   ceFAZolin (ANCEF) IVPB 1 g/50 mL premix  1 g 100 mL/hr over 30 Minutes Intravenous 30 min pre-op 09/02/11 [id number] 09/02/11 0928   09/02/11 0700   ciprofloxacin (CIPRO) IVPB 400 mg        400 mg 200 mL/hr over 60 Minutes Intravenous 60 min pre-op 09/02/11 [id number] 09/02/11 0800          Assessment/Plan: s/p Procedure(s) (LRB): NEPHROLITHOTOMY PERCUTANEOUS (Left) He has no stones seen on CT scan today but the distal ureter and loop weren't visualized. He has a probable perinephric hematoma and ARI.  I am going to get a pelvic scan just to make sure there are no distal stones. I will keep him at least one more day to make sure his Hbg is stable and his ARI isn't worsening.  LOS: 1 day    Renleigh Ouellet J 09/03/2011

## 2011-09-04 ENCOUNTER — Encounter (HOSPITAL_COMMUNITY): Payer: Self-pay | Admitting: Urology

## 2011-09-04 LAB — BASIC METABOLIC PANEL
BUN: 33 mg/dL — ABNORMAL HIGH (ref 6–23)
CO2: 24 mEq/L (ref 19–32)
Calcium: 7.9 mg/dL — ABNORMAL LOW (ref 8.4–10.5)
Chloride: 105 mEq/L (ref 96–112)
Creatinine, Ser: 2.3 mg/dL — ABNORMAL HIGH (ref 0.50–1.35)
GFR calc Af Amer: 28 mL/min — ABNORMAL LOW (ref >90)
GFR calc non Af Amer: 24 mL/min — ABNORMAL LOW (ref >90)
Glucose, Bld: 101 mg/dL — ABNORMAL HIGH (ref 70–99)
Potassium: 5.4 mEq/L — ABNORMAL HIGH (ref 3.5–5.1)
Sodium: 136 mEq/L (ref 135–145)

## 2011-09-04 LAB — CBC
HCT: 28.7 % — ABNORMAL LOW (ref 39.0–52.0)
Hemoglobin: 9.2 g/dL — ABNORMAL LOW (ref 13.0–17.0)
MCH: 31 pg (ref 26.0–34.0)
MCHC: 32.1 g/dL (ref 30.0–36.0)
MCV: 96.6 fL (ref 78.0–100.0)
Platelets: 241 10*3/uL (ref 150–400)
RBC: 2.97 MIL/uL — ABNORMAL LOW (ref 4.22–5.81)
RDW: 13.9 % (ref 11.5–15.5)
WBC: 11.2 10*3/uL — ABNORMAL HIGH (ref 4.0–10.5)

## 2011-09-04 NOTE — Progress Notes (Signed)
Pt in bed with eyes closed resting quietly.No change in initial shift asses. Cont with Plan of care.

## 2011-09-04 NOTE — Progress Notes (Signed)
2 Days Post-Op Subjective: Patient without complaint. No pain, CP or SOB.   Objective: Vital signs in last 24 hours: Temp:  [98.6 F (37 C)-98.8 F (37.1 C)] 98.6 F (37 C) (06/12 0447) Pulse Rate:  [73-77] 75  (06/12 0447) Resp:  [16] 16  (06/12 0447) BP: (116-136)/(50-66) 116/50 mmHg (06/12 0447) SpO2:  [92 %-94 %] 92 % (06/12 0447)  Intake/Output from previous day: 06/11 0701 - 06/12 0700 In: 1113.3 [P.O.:480; I.V.:633.3] Out: 1900 [Urine:1900] Intake/Output this shift:    Physical Exam:  A and O x 3 Eating breakfast, NAD SCD's in place Good UOP in urostomy and left PNx. Urine in left PNx clear with old clot/sediment in tube  Lab Results:  Basename 09/04/11 0517 09/03/11 0453 09/02/11 1105  HGB 9.2* 9.9* 12.1*  HCT 28.7* 30.4* 35.9*   BMET  Basename 09/04/11 0517 09/03/11 1316  NA 136 133*  K 5.4* 5.2*  CL 105 101  CO2 24 24  GLUCOSE 101* 113*  BUN 33* 36*  CREATININE 2.30* 2.38*  CALCIUM 7.9* 8.2*    Studies/Results: Ct Abdomen Wo Contrast  09/03/2011  *RADIOLOGY REPORT*  Clinical Data:  Status post left percutaneous nephrolithotomy, evaluate for residual stones, status post cystoprostatectomy for bladder and prostate cancer  CT ABDOMEN WITHOUT CONTRAST  Technique:  Multidetector CT imaging of the abdomen was performed following the standard protocol without IV contrast.  Comparison:  08/16/2011  Findings:  Small left pleural effusion with associated left lower lobe atelectasis.  Pacemaker leads, incompletely visualized.  Unenhanced liver, pancreas, adrenal glands are within normal limits.  Spleen is notable for a calcified splenic granuloma.  Cholelithiasis, without associated inflammatory changes.  No intrahepatic or extrahepatic ductal dilatation.  Status post percutaneous left nephrostomy.  Large bore nephrostomy catheter and additional small bore nephroureteral catheter. Small amount of gas within the nondilated collecting system (series 2/image 21).  No  residual calculi are present.  Mild periureteral stranding, nonspecific.  Moderate subcapsular hematoma, measuring up to 2.4 cm in thickness (series 2/image 19), with mild compression of the underlying renal cortex.  Small amount of associated perinephric hemorrhage layering posteriorly along the left posterior pararenal space (series 2/image 23).  Small right kidney.  2.4 x 3.8 cm posterior right upper pole cyst. 1.6 x 1.6 cm anterior right lower pole cyst.  3 mm nonobstructing right lower pole calculus (series 2/image 30).  No hydronephrosis.  Right mid abdominal ostomy.  Atherosclerotic calcifications of the abdominal aorta and branch vessels.  No abdominal ascites.  Small retroperitoneal lymph nodes which do not meet pathologic CT size criteria.  Degenerative changes of the visualized thoracolumbar spine.  IMPRESSION:  Status post percutaneous left nephrostomy.  Small focus of gas within the collecting system.  No residual calculi are present.  Associated moderate subcapsular hematoma with mild compression of the underlying renal parenchyma, as described above.  These results will be called to the ordering clinician or representative by the Radiologist Assistant, and communication documented in the PACS Dashboard.  Original Report Authenticated By: [name] Hy, M.D.   Dg Abd 1 View  09/02/2011  *RADIOLOGY REPORT*  Clinical Data: Left ureteral pelvic junction stone.  ABDOMEN - 1 VIEW  Comparison: None.  Findings: Single C-arm image demonstrates a guide wire in the left renal collecting system as well as a drainage catheter.  There is no visible stone on the available image.  IMPRESSION: Left ureteral pelvic junction stone no longer apparent.  Original Report Authenticated By: Larey Seat, M.D.  Ct Pelvis Wo Contrast  09/03/2011  *RADIOLOGY REPORT*  Clinical Data:  Status post nephrolithotomy.  Evaluate for residual calculus.  History of bladder and prostate cancer.  History of renal failure.  CT  PELVIS WITHOUT CONTRAST  Technique:  Multidetector CT imaging of the pelvis was performed following the standard protocol without intravenous contrast.  Comparison:  Abdominal pelvic CT 08/16/2011.  Images from left percutaneous nephrostomy study.  Findings:  Left ureteral stent extends into the distal left ureter, just proximal to the right lower quadrant ileal conduit.  There is no residual left ureteral dilatation.  No calculi are seen within the distal left ureter.  There is stable calcification or postsurgical change along the posterior wall of the proximal ileal conduit on image 18.  No definite calculi are seen within the ileoconduit. The distal right ureter appears stable.  The kidneys and proximal ureters are not imaged on this examination.  Mild residual soft tissue stranding is present in the inferior left perinephric fat.  There is no focal fluid collection.  There are stable vascular calcifications.  There is a stable left inguinal hernia containing fat.  There is also a small right inguinal hernia containing a knuckle of bowel.  There is no evidence of bowel incarceration or obstruction.  There are no suspicious osseous findings.  Sclerotic lesion in the proximal right femur is stable from 2007 consistent with a bone island.  IMPRESSION:  1.  Interval left ureteral stent placement.  The distal aspect of the left ureter is decompressed.  No calculi are seen within the distal ureters or ileoconduit.  The kidneys and proximal ureters are not imaged on this examination of the pelvis. 2.  Probable calcifications or postsurgical changes within the wall of the ileoconduit are unchanged. 3.  Stable inguinal hernias.  Original Report Authenticated By: Vivia Ewing, M.D.   Dg C-arm 1-60 Min-no Report  09/02/2011  CLINICAL DATA: kidney stone   C-ARM 1-60 MINUTES  Fluoroscopy was utilized by the requesting physician.  No radiographic  interpretation.      Assessment/Plan: S/p LEFT PCNL Acute on  CRF Hyperkalemia  -SLIV, change to renal diet -Spoke with Dr. Justin Mend, nephrology who will see    LOS: 2 days   [name] [name] 09/04/2011, 7:58 AM

## 2011-09-04 NOTE — Consult Note (Signed)
Referring Provider: No ref. provider found Primary Care Physician:  Jerlyn Ly, MD Primary Nephrologist:  none  Reason for Consultation:  Acute renal failure  HPI: He has a history of bladder cancer with prior cystoprostatectomy with urethrectomy and ileal conduit several years ago. He has a history of stones and had a previous right PCNL for an obstructing stone.There is evidence of moderately severe left-sided hydronephrosis secondary to an obstructing calculus at the ureteropelvic junction measuring approximately 5 x 9 x 12 mm.on CT scan. Baseline CKD stage 3 Creatinine 1.9 5/13    Past Medical History  Diagnosis Date  . Hypertension   . Aortic valve stenosis   . Gout   . Hx of bladder cancer   . Deep venous thrombosis     hx of  . Osteoporosis   . Renal failure   . Heart murmur   . Depression   . Chronic kidney disease   . Kidney stone   . Prostate cancer     AND BLADDER CANCER - S/P URETEROILEAL CONDUIT  . 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 Cristopher Peru -ELECTROPHYSIOLOGIST    Past Surgical History  Procedure Date  . Insert / replace / remove pacemaker   . Cystectomy   . Lymphadenectomy   . Prostate surgery   . Appendectomy   . Cataract extraction, bilateral   . Kidney stone surgery   . Ilial conduit     for bladder cancer  . Nephrolithotomy 09/02/2011    Procedure: NEPHROLITHOTOMY PERCUTANEOUS;  Surgeon: Malka So, MD;  Location: [facility];  Service: Urology;  Laterality: Left;    Prior to Admission medications   Medication Sig Start Date End Date Taking? Authorizing Provider  acetaminophen (TYLENOL) 325 MG tablet Take 650 mg by mouth every 6 (six) hours as needed. For pain   Yes Historical Provider, MD  amLODipine (NORVASC) 5 MG tablet Take 1 tablet (5 mg total) by mouth daily. 08/20/11 08/19/12 Yes Novlet Thea Alken, MD  aspirin 81 MG tablet Take 81 mg by mouth daily.    Yes Historical Provider, MD  Cholecalciferol (VITAMIN D-3 PO) Take by  mouth. 1000 iu  daily   Yes Historical Provider, MD  colchicine 0.6 MG tablet Take 0.6 mg by mouth as needed.    Yes Historical Provider, MD  HYDROcodone-acetaminophen (NORCO) 5-325 MG per tablet Take 2 tablets by mouth every 4 (four) hours as needed. For pain   Yes Historical Provider, MD  ibandronate (BONIVA) 150 MG tablet Take 150 mg by mouth every 30 (thirty) days. Take in the morning with a full glass of water, on an empty stomach, and do not take anything else by mouth or lie down for the next 30 min.   Yes Historical Provider, MD  multivitamin-lutein (OCUVITE-LUTEIN) CAPS Take 1 capsule by mouth daily.   Yes Historical Provider, MD  ULORIC 40 MG tablet daily.  08/29/10  Yes Historical Provider, MD    Current Facility-Administered Medications  Medication Dose Route Frequency Provider Last Rate Last Dose  . acetaminophen (TYLENOL) tablet 650 mg  650 mg Oral Q4H PRN Malka So, MD      . amLODipine (NORVASC) tablet 5 mg  5 mg Oral Daily Malka So, MD   5 mg at 09/04/11 0933  . bisacodyl (DULCOLAX) suppository 10 mg  10 mg Rectal Daily PRN Malka So, MD      . ceFAZolin (ANCEF) IVPB 1 g/50 mL premix  1 g Intravenous Q12H Estill Bamberg  Renaye Rakers, PHARMD   1 g at 09/04/11 0933  . colchicine tablet 0.6 mg  0.6 mg Oral PRN Malka So, MD      . febuxostat Melburn Popper) tablet 40 mg  40 mg Oral Daily Malka So, MD   40 mg at 09/04/11 0933  . HYDROcodone-acetaminophen (NORCO) 5-325 MG per tablet 1-2 tablet  1-2 tablet Oral Q4H PRN Malka So, MD      . HYDROmorphone (DILAUDID) injection 0.5-1 mg  0.5-1 mg Intravenous Q2H PRN Malka So, MD   1 mg at 09/04/11 0330  . multivitamin (PROSIGHT) tablet 1 tablet  1 tablet Oral Daily Malka So, MD   1 tablet at 09/04/11 0933  . ondansetron (ZOFRAN) injection 4 mg  4 mg Intravenous Q4H PRN Malka So, MD      . sodium polystyrene (KAYEXALATE) 15 GM/60ML suspension 15 g  15 g Oral Once Fredricka Bonine, MD   15 g at 09/03/11 1643  . zolpidem  (AMBIEN) tablet 5 mg  5 mg Oral QHS PRN Malka So, MD      . DISCONTD: 0.9 %  sodium chloride infusion   Intravenous Continuous Fredricka Bonine, MD 50 mL/hr at 09/03/11 1320 50 mL/hr at 09/03/11 1320  . DISCONTD: dextrose 5 % and 0.45 % NaCl with KCl 20 mEq/L infusion   Intravenous Continuous Malka So, MD 75 mL/hr at 09/03/11 0942      Allergies as of 08/20/2011  . (No Known Allergies)    Family History  Problem Relation Age of Onset  . Prostate cancer Father 94    died  . Pancreatic cancer Mother 20    died    History   Social History  . Marital Status: Married    Spouse Name: N/A    Number of Children: 3  . Years of Education: N/A   Occupational History  .     Social History Main Topics  . Smoking status: Former Smoker    Types: Cigarettes    Quit date: 03/25/1952  . Smokeless tobacco: Never Used  . Alcohol Use: No  . Drug Use: No  . Sexually Active: No   Other Topics Concern  . Not on file   Social History Narrative  . No narrative on file    Review of Systems: Gen: Denies any fever, chills, sweats, anorexia, fatigue, weakness, malaise, weight loss, and sleep disorder HEENT: No visual complaints, No history of Retinopathy. Normal external appearance No Epistaxis or Sore throat. No sinusitis.   CV: Denies chest pain, angina, palpitations, syncope, orthopnea, PND, peripheral edema, and claudication. Resp: Denies dyspnea at rest, dyspnea with exercise, cough, sputum, wheezing, coughing up blood, and pleurisy. GI: Denies vomiting blood, jaundice, and fecal incontinence.   Denies dysphagia or odynophagia. GU : Denies urinary burning, blood in urine, urinary frequency, urinary hesitancy, nocturnal urination, and urinary incontinence.  No renal calculi. MS: Denies joint pain, limitation of movement, and swelling, stiffness, low back pain, extremity pain. Denies muscle weakness, cramps, atrophy.  No use of non steroidal antiinflammatory drugs. Derm:  Denies rash, itching, dry skin, hives, moles, warts, or unhealing ulcers.  Psych: Denies depression, anxiety, memory loss, suicidal ideation, hallucinations, paranoia, and confusion. Heme: Denies bruising, bleeding, and enlarged lymph nodes. Neuro: No headache.  No diplopia. No dysarthria.  No dysphasia.  No history of CVA.  No Seizures. No paresthesias.  No weakness. Endocrine No DM.  No Thyroid disease.  No Adrenal disease.  Physical Exam: Vital signs in last 24 hours: Temp:  [98.6 F (37 C)-98.8 F (37.1 C)] 98.6 F (37 C) (06/12 0447) Pulse Rate:  [73-77] 75  (06/12 0447) Resp:  [16] 16  (06/12 0447) BP: (116-136)/(50-66) 116/50 mmHg (06/12 0447) SpO2:  [92 %-94 %] 92 % (06/12 0447) Last BM Date: 09/04/11 General:   Elderly non distressed Head:  Normocephalic and atraumatic. Eyes:  Sclera clear, no icterus.   Conjunctiva pink. Ears:  Normal auditory acuity. Nose:  No deformity, discharge,  or lesions. Mouth:  No deformity or lesions, dentition normal. Neck:  Supple; no masses or thyromegaly. JVP not elevated Lungs:  Clear throughout to auscultation.   No wheezes, crackles, or rhonchi. No acute distress. Heart:  Regular rate and rhythm; no murmurs, clicks, rubs,  or gallops. Abdomen:  Soft, nontender and nondistended. No masses, hepatosplenomegaly or hernias noted. Normal bowel sounds, without guarding, and without rebound.   Msk:  Symmetrical without gross deformities. Normal posture. Pulses:  No carotid, renal, femoral bruits. DP and PT symmetrical and equal Extremities:  Without clubbing or edema. Neurologic:  Alert and  oriented x4;  grossly normal neurologically. Skin:  Intact without significant lesions or rashes. Cervical Nodes:  No significant cervical adenopathy. Psych:  Alert and cooperative. Normal mood and affect.  Intake/Output from previous day: 06/11 0701 - 06/12 0700 In: 1113.3 [P.O.:480; I.V.:633.3] Out: 1900 [Urine:1900] Intake/Output this shift: Total  I/O In: 120 [P.O.:120] Out: 501 [Urine:500; Stool:1]  Lab Results:  Basename 09/04/11 0517 09/03/11 0453 09/02/11 1105  WBC 11.2* -- --  HGB 9.2* 9.9* 12.1*  HCT 28.7* 30.4* 35.9*  PLT 241 -- --   BMET  Basename 09/04/11 0517 09/03/11 1316 09/03/11 0453  NA 136 133* --  K 5.4* 5.2* --  CL 105 101 --  CO2 24 24 --  GLUCOSE 101* 113* --  BUN 33* 36* --  CREATININE 2.30* 2.38* 2.56*  CALCIUM 7.9* 8.2* --  PHOS -- -- --   LFT No results found for this basename: PROT,ALBUMIN,AST,ALT,ALKPHOS,BILITOT,BILIDIR,IBILI in the last 72 hours PT/INR No results found for this basename: LABPROT:2,INR:2 in the last 72 hours Hepatitis Panel No results found for this basename: HEPBSAG,HCVAB,HEPAIGM,HEPBIGM in the last 72 hours  Studies/Results: Ct Abdomen Wo Contrast  09/03/2011  *RADIOLOGY REPORT*  Clinical Data:  Status post left percutaneous nephrolithotomy, evaluate for residual stones, status post cystoprostatectomy for bladder and prostate cancer  CT ABDOMEN WITHOUT CONTRAST  Technique:  Multidetector CT imaging of the abdomen was performed following the standard protocol without IV contrast.  Comparison:  08/16/2011  Findings:  Small left pleural effusion with associated left lower lobe atelectasis.  Pacemaker leads, incompletely visualized.  Unenhanced liver, pancreas, adrenal glands are within normal limits.  Spleen is notable for a calcified splenic granuloma.  Cholelithiasis, without associated inflammatory changes.  No intrahepatic or extrahepatic ductal dilatation.  Status post percutaneous left nephrostomy.  Large bore nephrostomy catheter and additional small bore nephroureteral catheter. Small amount of gas within the nondilated collecting system (series 2/image 21).  No residual calculi are present.  Mild periureteral stranding, nonspecific.  Moderate subcapsular hematoma, measuring up to 2.4 cm in thickness (series 2/image 19), with mild compression of the underlying renal cortex.   Small amount of associated perinephric hemorrhage layering posteriorly along the left posterior pararenal space (series 2/image 23).  Small right kidney.  2.4 x 3.8 cm posterior right upper pole cyst. 1.6 x 1.6 cm anterior right lower pole cyst.  3 mm nonobstructing right lower  pole calculus (series 2/image 30).  No hydronephrosis.  Right mid abdominal ostomy.  Atherosclerotic calcifications of the abdominal aorta and branch vessels.  No abdominal ascites.  Small retroperitoneal lymph nodes which do not meet pathologic CT size criteria.  Degenerative changes of the visualized thoracolumbar spine.  IMPRESSION:  Status post percutaneous left nephrostomy.  Small focus of gas within the collecting system.  No residual calculi are present.  Associated moderate subcapsular hematoma with mild compression of the underlying renal parenchyma, as described above.  These results will be called to the ordering clinician or representative by the Radiologist Assistant, and communication documented in the PACS Dashboard.  Original Report Authenticated By: [name] Hy, M.D.   Ct Pelvis Wo Contrast  09/03/2011  *RADIOLOGY REPORT*  Clinical Data:  Status post nephrolithotomy.  Evaluate for residual calculus.  History of bladder and prostate cancer.  History of renal failure.  CT PELVIS WITHOUT CONTRAST  Technique:  Multidetector CT imaging of the pelvis was performed following the standard protocol without intravenous contrast.  Comparison:  Abdominal pelvic CT 08/16/2011.  Images from left percutaneous nephrostomy study.  Findings:  Left ureteral stent extends into the distal left ureter, just proximal to the right lower quadrant ileal conduit.  There is no residual left ureteral dilatation.  No calculi are seen within the distal left ureter.  There is stable calcification or postsurgical change along the posterior wall of the proximal ileal conduit on image 18.  No definite calculi are seen within the ileoconduit. The distal  right ureter appears stable.  The kidneys and proximal ureters are not imaged on this examination.  Mild residual soft tissue stranding is present in the inferior left perinephric fat.  There is no focal fluid collection.  There are stable vascular calcifications.  There is a stable left inguinal hernia containing fat.  There is also a small right inguinal hernia containing a knuckle of bowel.  There is no evidence of bowel incarceration or obstruction.  There are no suspicious osseous findings.  Sclerotic lesion in the proximal right femur is stable from 2007 consistent with a bone island.  IMPRESSION:  1.  Interval left ureteral stent placement.  The distal aspect of the left ureter is decompressed.  No calculi are seen within the distal ureters or ileoconduit.  The kidneys and proximal ureters are not imaged on this examination of the pelvis. 2.  Probable calcifications or postsurgical changes within the wall of the ileoconduit are unchanged. 3.  Stable inguinal hernias.  Original Report Authenticated By: Vivia Ewing, M.D.    Assessment/Plan:  Acute on Chronic renal failure secondary to PUJ obstructing calculus. Now with great urine output  HTN controlled  Hyperkalemia low potassium diet will follow  Creatinine now falling to baseline will follow   LOS: 2 [name] [name] W @TODAY @12 :21 PM

## 2011-09-05 LAB — BASIC METABOLIC PANEL
BUN: 35 mg/dL — ABNORMAL HIGH (ref 6–23)
Chloride: 105 mEq/L (ref 96–112)
GFR calc Af Amer: 31 mL/min — ABNORMAL LOW (ref >90)
GFR calc non Af Amer: 27 mL/min — ABNORMAL LOW (ref >90)
Potassium: 4.7 mEq/L (ref 3.5–5.1)
Sodium: 138 mEq/L (ref 135–145)

## 2011-09-05 MED ORDER — OXYCODONE-ACETAMINOPHEN 5-325 MG PO TABS: 1.0000 | ORAL_TABLET | Freq: Four times a day (QID) | ORAL | Status: AC | PRN

## 2011-09-05 MED ORDER — ASPIRIN 81 MG PO TABS: 81.0000 mg | ORAL_TABLET | Freq: Every day | ORAL | Status: DC

## 2011-09-05 MED ORDER — SULFAMETHOXAZOLE-TMP DS 800-160 MG PO TABS: 1.0000 | ORAL_TABLET | Freq: Every day | ORAL | Status: AC

## 2011-09-05 MED ORDER — DOCUSATE SODIUM 100 MG PO CAPS: 100.0000 mg | ORAL_CAPSULE | Freq: Two times a day (BID) | ORAL | Status: AC

## 2011-09-05 NOTE — Progress Notes (Signed)
Pt given discharge instructions and reviewed. All questions answered. PT sent home with L nephrostomy per MD order. Callie Fielding RN

## 2011-09-05 NOTE — Discharge Summary (Signed)
Physician Discharge Summary  Patient ID: [name] [name] MRN: [id number] DOB/AGE: 1921-06-14 78 y.o.  Admit date: 09/02/2011 Discharge date: 09/05/2011  Admission Diagnoses: left nephrolithiasis, chronic renal failure  Discharge Diagnoses:  Left nephrolithiasis Acute renal failure Chronic renal failure  Discharged Condition: good  Hospital Course: Admit s/p PCNL, left. CT A/P revealed no obvious residual stone and fluid/hematoma around kidney after procedure. Pt H/H declined slightly but he remained stable. Cr increased slightly but has returned to baseline without intervention. Pt pain controlled and tolerating regular diet.   Consults: nephrology   Significant Diagnostic Studies: radiology: CT scan - as above  Treatments: surgery: Left PCNL  Discharge Exam: Blood pressure 135/68, pulse 75, temperature 97.6 F (36.4 C), temperature source Oral, resp. rate 18, height 5\' 7"  (1.702 m), weight 74.39 kg (164 lb), SpO2 94.00%. Well appearing NAd A&Ox3 Abd - soft, NT, ND Left PCNx - urine clear  Disposition: 06-Home-Health Care Svc  Discharge Orders    Future Appointments: Provider: Department: Dept Phone: Center:   10/03/2011 10:15 AM Evans Lance, [place] [phone] [id number]     Medication List  As of 09/05/2011  7:09 AM   TAKE these medications         acetaminophen 325 MG tablet   Commonly known as: TYLENOL   Take 650 mg by mouth every 6 (six) hours as needed. For pain      amLODipine 5 MG tablet   Commonly known as: NORVASC   Take 1 tablet (5 mg total) by mouth daily.      aspirin 81 MG tablet   Take 1 tablet (81 mg total) by mouth daily.   Start taking on: 09/08/2011      colchicine 0.6 MG tablet   Take 0.6 mg by mouth as needed.      docusate sodium 100 MG capsule   Commonly known as: COLACE   Take 1 capsule (100 mg total) by mouth 2 (two) times daily.      HYDROcodone-acetaminophen 5-325 MG per tablet   Commonly known as: NORCO   Take 2 tablets by mouth every 4 (four) hours as needed. For pain      ibandronate 150 MG tablet   Commonly known as: BONIVA   Take 150 mg by mouth every 30 (thirty) days. Take in the morning with a full glass of water, on an empty stomach, and do not take anything else by mouth or lie down for the next 30 min.      multivitamin-lutein Caps   Take 1 capsule by mouth daily.      oxyCODONE-acetaminophen 5-325 MG per tablet   Commonly known as: PERCOCET   Take 1 tablet by mouth every 6 (six) hours as needed for pain.      sulfamethoxazole-trimethoprim 800-160 MG per tablet   Commonly known as: BACTRIM DS   Take 1 tablet by mouth daily after breakfast.      ULORIC 40 MG tablet   Generic drug: febuxostat   daily.      VITAMIN D-3 PO   Take by mouth. 1000 iu  daily           Follow-up Information    Follow up with Malka So, MD. (As scheduled.)    Contact information:   [place] [address] [place] [phone]          Signed: Fredricka Bonine 09/05/2011, 7:09 AM

## 2011-09-05 NOTE — Progress Notes (Signed)
[facility] ROUNDING NOTE   Subjective:   Interval History: none.  Objective:  Vital signs in last 24 hours:  Temp:  [97.6 F (36.4 C)-99.4 F (37.4 C)] 97.6 F (36.4 C) (06/13 0448) Pulse Rate:  [75-76] 75  (06/13 0448) Resp:  [16-18] 18  (06/13 0448) BP: (115-135)/(61-68) 135/68 mmHg (06/13 0448) SpO2:  [94 %-100 %] 94 % (06/13 0448)  Weight change:  Filed Weights   09/02/11 0624  Weight: 74.39 kg (164 lb)    Intake/Output: I/O last 3 completed shifts: In: 1623.3 [P.O.:840; I.V.:633.3; IV Piggyback:150] Out: 2526 [Urine:2525; Stool:1]   Intake/Output this shift:  Total I/O In: 240 [P.O.:240] Out: 150 [Urine:150]  CVS- RRR RS- CTA ABD- BS present soft non-distended EXT- no edema   Basic Metabolic Panel:  Lab [id number] 09/04/11 0517 09/03/11 1316 09/03/11 0453 09/02/11 1225  NA 138 136 133* -- --  K 4.7 5.4* 5.2* -- --  CL 105 105 101 -- --  CO2 25 24 24  -- --  GLUCOSE 96 101* 113* -- --  BUN 35* 33* 36* -- --  CREATININE 2.06* 2.30* 2.38* 2.56* 1.71*  CALCIUM 8.4 7.9* 8.2* -- --  MG -- -- -- -- --  PHOS -- -- -- -- --    Liver Function Tests: No results found for this basename: AST:5,ALT:5,ALKPHOS:5,BILITOT:5,PROT:5,ALBUMIN:5 in the last 168 hours No results found for this basename: LIPASE:5,AMYLASE:5 in the last 168 hours No results found for this basename: AMMONIA:3 in the last 168 hours  CBC:  Lab 09/04/11 0517 09/03/11 0453 09/02/11 1105  WBC 11.2* -- --  NEUTROABS -- -- --  HGB 9.2* 9.9* 12.1*  HCT 28.7* 30.4* 35.9*  MCV 96.6 -- --  PLT 241 -- --    Cardiac Enzymes: No results found for this basename: CKTOTAL:5,CKMB:5,CKMBINDEX:5,TROPONINI:5 in the last 168 hours  BNP: No components found with this basename: POCBNP:5  CBG: No results found for this basename: GLUCAP:5 in the last 168 hours  Microbiology: Results for orders placed during the hospital encounter of 08/16/11  URINE CULTURE     Status: Normal   Collection Time   08/16/11  4:11 PM      Component Value Range Status Comment   Specimen Description URINE, RANDOM   Final    Special Requests rochephin Normal   Final    Culture  Setup Time [id number]   Final    Colony Count >=100,000 COLONIES/ML   Final    Culture     Final    Value: Multiple bacterial morphotypes present, none predominant. Suggest appropriate recollection if clinically indicated.   Report Status 08/18/2011 FINAL   Final   CULTURE, BLOOD (ROUTINE X 2)     Status: Normal   Collection Time   08/16/11  7:40 PM      Component Value Range Status Comment   Specimen Description BLOOD RIGHT HAND   Final    Special Requests BOTTLES DRAWN AEROBIC AND ANAEROBIC 4CC   Final    Culture  Setup Time [id number]   Final    Culture NO GROWTH 5 DAYS   Final    Report Status 08/23/2011 FINAL   Final   CULTURE, BLOOD (ROUTINE X 2)     Status: Normal   Collection Time   08/16/11  7:46 PM      Component Value Range Status Comment   Specimen Description BLOOD LEFT HAND   Final    Special Requests BOTTLES DRAWN AEROBIC AND ANAEROBIC 3CC   Final  Culture  Setup Time [id number]   Final    Culture NO GROWTH 5 DAYS   Final    Report Status 08/23/2011 FINAL   Final   MRSA PCR SCREENING     Status: Normal   Collection Time   08/16/11  7:54 PM      Component Value Range Status Comment   MRSA by PCR NEGATIVE  NEGATIVE Final   URINE CULTURE     Status: Normal   Collection Time   08/17/11  2:50 PM      Component Value Range Status Comment   Specimen Description URINE, RANDOM   Final    Special Requests NONE   Final    Culture  Setup Time [id number]   Final    Colony Count NO GROWTH   Final    Culture NO GROWTH   Final    Report Status 08/18/2011 FINAL   Final     Coagulation Studies: No results found for this basename: LABPROT:5,INR:5 in the last 72 hours  Urinalysis: No results found for this basename:  COLORURINE:2,APPERANCEUR:2,LABSPEC:2,PHURINE:2,GLUCOSEU:2,HGBUR:2,BILIRUBINUR:2,KETONESUR:2,PROTEINUR:2,UROBILINOGEN:2,NITRITE:2,LEUKOCYTESUR:2 in the last 72 hours    Imaging: No results found.   Medications:        . amLODipine  5 mg Oral Daily  .  ceFAZolin (ANCEF) IV  1 g Intravenous Q12H  . febuxostat  40 mg Oral Daily  . multivitamin  1 tablet Oral Daily   acetaminophen, bisacodyl, colchicine, HYDROcodone-acetaminophen, HYDROmorphone (DILAUDID) injection, ondansetron, zolpidem  Assessment/ Plan:   Acute renal failure. Patient now back to baseline 1.9. No need for nephrology followup unless felt important by PCP. Discussed with son, important to maintain flow through drainage tubes and avoid nephrotoxins and NSAIDS.   LOS: 3 [name] [name] W @TODAY @9 :49 AM

## 2011-09-09 ENCOUNTER — Other Ambulatory Visit: Payer: Self-pay | Admitting: Urology

## 2011-09-09 ENCOUNTER — Ambulatory Visit (HOSPITAL_COMMUNITY)
Admission: RE | Admit: 2011-09-09 | Discharge: 2011-09-09 | Disposition: A | Discharge status: Home / Self Care | Payer: Medicare Other | Source: Ambulatory Visit | Attending: Urology | Admitting: Urology

## 2011-09-09 DIAGNOSIS — N200 Calculus of kidney: Secondary | ICD-10-CM | POA: Insufficient documentation

## 2011-09-09 DIAGNOSIS — Z436 Encounter for attention to other artificial openings of urinary tract: Secondary | ICD-10-CM | POA: Insufficient documentation

## 2011-09-09 MED ORDER — IOHEXOL 300 MG/ML  SOLN
50.0000 mL | Freq: Once | INTRAMUSCULAR | Status: AC | PRN
  Administered 2011-09-09: 50 mL

## 2011-10-03 ENCOUNTER — Encounter: Payer: Self-pay | Admitting: Internal Medicine

## 2011-10-03 ENCOUNTER — Ambulatory Visit (INDEPENDENT_AMBULATORY_CARE_PROVIDER_SITE_OTHER): Payer: Medicare Other | Admitting: Internal Medicine

## 2011-10-03 VITALS — BP 142/80 | HR 83 | Ht 68.0 in | Wt 158.0 lb

## 2011-10-03 DIAGNOSIS — Z950 Presence of cardiac pacemaker: Secondary | ICD-10-CM

## 2011-10-03 DIAGNOSIS — I498 Other specified cardiac arrhythmias: Secondary | ICD-10-CM

## 2011-10-03 DIAGNOSIS — R001 Bradycardia, unspecified: Secondary | ICD-10-CM

## 2011-10-03 DIAGNOSIS — I10 Essential (primary) hypertension: Secondary | ICD-10-CM

## 2011-10-03 LAB — PACEMAKER DEVICE OBSERVATION
AL AMPLITUDE: 2.8 mv
AL THRESHOLD: 0.375 V
BAMS-0001: 175 {beats}/min
BATTERY VOLTAGE: 2.79 V
RV LEAD AMPLITUDE: 11.2 mv
RV LEAD THRESHOLD: 0.5 V

## 2011-10-03 NOTE — Assessment & Plan Note (Signed)
His device is working normally. We'll plan to recheck in several months. 

## 2011-10-03 NOTE — Assessment & Plan Note (Signed)
His blood pressure is reasonably well controlled. We discussed the possibility of up titration of his medications. However I have recommended he continue to keep track of his blood pressure and let us know if it remains high. Otherwise a low-sodium diet is requested.

## 2011-10-03 NOTE — Patient Instructions (Addendum)
Your physician wants you to follow-up in: 12 months with Dr Knox Saliva will receive a reminder letter in the mail two months in advance. If you don't receive a letter, please call our office to schedule the follow-up appointment.   Remote monitoring is used to monitor your Pacemaker of ICD from home. This monitoring reduces the number of office visits required to check your device to one time per year. It allows [place] to keep an eye on the functioning of your device to ensure it is working properly. You are scheduled for a device check from home on 01/06/2012. You may send your transmission at any time that day. If you have a wireless device, the transmission will be sent automatically. After your physician reviews your transmission, you will receive a postcard with your next transmission date.

## 2011-10-03 NOTE — Progress Notes (Signed)
HPI [name] returns today for followup. He is a very pleasant 78 year old man with symptomatic bradycardia, status post permanent pacemaker insertion. The patient denies chest pain or shortness of breath. In the interim, he has been stable. She remains active, working in his garden. He denies syncope or near-syncope. No Known Allergies   Current Outpatient Prescriptions  Medication Sig Dispense Refill  . acetaminophen (TYLENOL) 325 MG tablet Take 650 mg by mouth every 6 (six) hours as needed. For pain      . aspirin 81 MG tablet Take 1 tablet (81 mg total) by mouth daily.  30 tablet    . Cholecalciferol (VITAMIN D-3 PO) Take by mouth. 1000 iu  daily      . colchicine 0.6 MG tablet Take 0.6 mg by mouth as needed.       . docusate sodium (COLACE) 100 MG capsule Take 100 mg by mouth 2 (two) times daily.      Marland Kitchen ibandronate (BONIVA) 150 MG tablet Take 150 mg by mouth every 30 (thirty) days. Take in the morning with a full glass of water, on an empty stomach, and do not take anything else by mouth or lie down for the next 30 min.      . multivitamin-lutein (OCUVITE-LUTEIN) CAPS Take 1 capsule by mouth daily.      Marland Kitchen ULORIC 40 MG tablet daily.          Past Medical History  Diagnosis Date  . Hypertension   . Aortic valve stenosis   . Gout   . Hx of bladder cancer   . Deep venous thrombosis     hx of  . Osteoporosis   . Renal failure   . Heart murmur   . Depression   . Chronic kidney disease   . Kidney stone   . Prostate cancer     AND BLADDER CANCER - S/P URETEROILEAL CONDUIT  . 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 GREGG TAYLOR -ELECTROPHYSIOLOGIST    ROS:   All systems reviewed and negative except as noted in the HPI.   Past Surgical History  Procedure Date  . Insert / replace / remove pacemaker   . Cystectomy   . Lymphadenectomy   . Prostate surgery   . Appendectomy   . Cataract extraction, bilateral   . Kidney stone surgery   . Ilial conduit    for bladder cancer  . Nephrolithotomy 09/02/2011    Procedure: NEPHROLITHOTOMY PERCUTANEOUS;  Surgeon: Malka So, MD;  Location: [facility];  Service: Urology;  Laterality: Left;     Family History  Problem Relation Age of Onset  . Prostate cancer Father 103    died  . Pancreatic cancer Mother 96    died     History   Social History  . Marital Status: Married    Spouse Name: N/A    Number of Children: 3  . Years of Education: N/A   Occupational History  .     Social History Main Topics  . Smoking status: Former Smoker    Types: Cigarettes    Quit date: 03/25/1952  . Smokeless tobacco: Never Used  . Alcohol Use: No  . Drug Use: No  . Sexually Active: No   Other Topics Concern  . Not on file   Social History Narrative  . No narrative on file     BP 142/80  Pulse 83  Ht 5\' 8"  (1.727 m)  Wt 158 lb (71.668 kg)  BMI  24.02 kg/m2  Physical Exam:  Well appearing elderly man, NAD HEENT: Unremarkable Neck:  No JVD, no thyromegally Lungs:  Clear with no wheezes, rales, or rhonchi. Well-healed pacemaker incision. HEART:  Regular rate rhythm, 2/6 systolic murmur, no rubs, no clicks Abd:  soft, positive bowel sounds, no organomegally, no rebound, no guarding Ext:  2 plus pulses, no edema, no cyanosis, no clubbing Skin:  No rashes no nodules Neuro:  CN II through XII intact, motor grossly intact  DEVICE  Normal device function.  See PaceArt for details.   Assess/Plan:

## 2011-11-20 ENCOUNTER — Encounter (INDEPENDENT_AMBULATORY_CARE_PROVIDER_SITE_OTHER): Payer: Medicare Other | Admitting: Ophthalmology

## 2011-11-20 DIAGNOSIS — H3530 Unspecified macular degeneration: Secondary | ICD-10-CM

## 2011-11-20 DIAGNOSIS — H43819 Vitreous degeneration, unspecified eye: Secondary | ICD-10-CM

## 2012-01-06 ENCOUNTER — Encounter: Payer: Medicare Other | Admitting: *Deleted

## 2012-01-09 ENCOUNTER — Encounter: Payer: Self-pay | Admitting: *Deleted

## 2012-02-10 ENCOUNTER — Encounter: Payer: Self-pay | Admitting: Cardiology

## 2012-02-27 IMAGING — CR DG LOOPOGRAM
1 series · 1 of 1 positions shown · non-contrast
Comparison: CT 08/30/2007

CLINICAL DATA: Left flank pain.  Nausea.  Blood in urine.  Ileal
conduit for approximately 6 years.

LOOPOGRAM

[view not recorded]
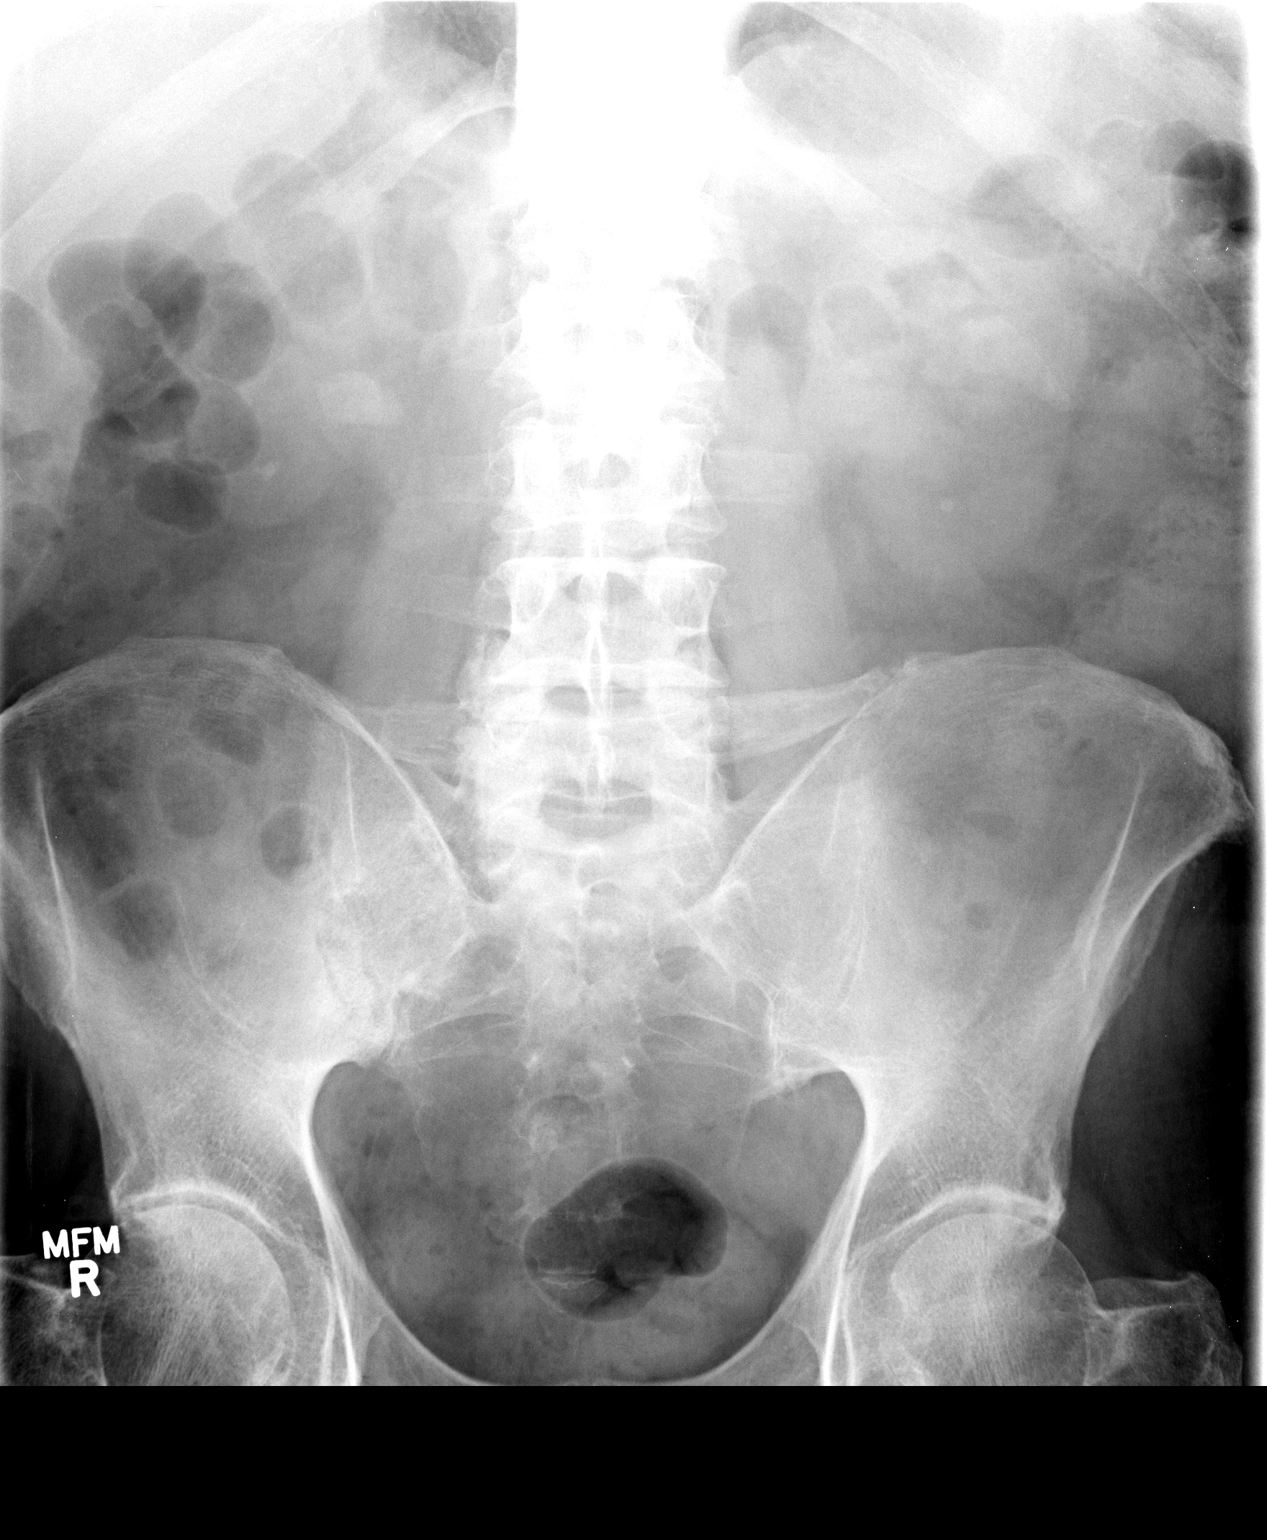

[1 of 1 positions shown; findings below may reference images not displayed]

FINDINGS: Scout:  Overlying the region of the right central renal pelvis,
there is a calcification measuring 2.4 x 1.7 cm.  Smaller right
lower pole calculus measures 6.2 mm.  Left lower pole calculus is 5
mm.

Following scout film, approximately 20 ml of cystogram was injected
during fluoroscopic evaluation via Foley catheter with 2 ml balloon
inflated in the ileal conduit.  The ileal conduit has a normal
appearance.  Both ureters reflux normally.  The collecting systems
have a normal appearance.

Fluoroscopy time:  1.2 minutes.
IMPRESSION: 1.  Bilateral intrarenal calculi, largest on the right measuring
2.4 cm.
2.  Normal-appearing ileal conduit.
3.  Bilateral ureteral reflux, as expected.

## 2012-03-13 ENCOUNTER — Encounter: Payer: Self-pay | Admitting: Internal Medicine

## 2012-03-13 ENCOUNTER — Telehealth: Payer: Self-pay | Admitting: Internal Medicine

## 2012-03-13 NOTE — Telephone Encounter (Signed)
03-13-12 called pt re missed remote for October , he said he did it and got a bill for it, so I ask him to do another in January, and I sent a reminder letter/mt

## 2012-04-07 ENCOUNTER — Encounter: Payer: Self-pay | Admitting: Cardiology

## 2012-04-07 ENCOUNTER — Ambulatory Visit (INDEPENDENT_AMBULATORY_CARE_PROVIDER_SITE_OTHER): Payer: Medicare Other | Admitting: Cardiology

## 2012-04-07 VITALS — BP 178/90 | HR 78 | Ht 68.0 in | Wt 174.0 lb

## 2012-04-07 DIAGNOSIS — I495 Sick sinus syndrome: Secondary | ICD-10-CM

## 2012-04-07 DIAGNOSIS — Z950 Presence of cardiac pacemaker: Secondary | ICD-10-CM

## 2012-04-07 DIAGNOSIS — I498 Other specified cardiac arrhythmias: Secondary | ICD-10-CM

## 2012-04-07 DIAGNOSIS — I10 Essential (primary) hypertension: Secondary | ICD-10-CM

## 2012-04-07 DIAGNOSIS — R001 Bradycardia, unspecified: Secondary | ICD-10-CM

## 2012-04-07 LAB — PACEMAKER DEVICE OBSERVATION
AL IMPEDENCE PM: 466 Ohm
AL THRESHOLD: 0.5 V
BATTERY VOLTAGE: 2.79 V
RV LEAD AMPLITUDE: 8 mv
RV LEAD IMPEDENCE PM: 619 Ohm
VENTRICULAR PACING PM: 0.8

## 2012-04-07 NOTE — Progress Notes (Signed)
ELECTROPHYSIOLOGY OFFICE NOTE  Patient ID: [name] [name] MRN: [id number], DOB/AGE: 1921-08-04   Date of Visit: 04/07/2012  Primary Physician: [name] Infante, MD Primary Cardiologist: [name] Peru, MD Reason for Visit: EP/device follow-up  History of Present Illness  [name] [name] is a pleasant 79 year old gentleman with sinus node dysfunction s/p PPM, normal LV function, mild to moderate AS by echo May 2013, HTN and CKD who presents today for routine EP follow-up. He is accompanied by his son. He denies any complaints other than intermittent postural dizziness. He describes brief dizziness when he stands too quickly from a seated position. He denies any other complaints today and feels he has been doing well. He specifically denies CP, SOB, palpitations, [name] swelling, orthopnea or PND. He denies near syncope or syncope. He walks 1-2 miles daily (weather permitting) without difficulty. Of note, he has history of HTN and was previously on amlodipine but this was discontinued ~1 year ago as he had improved BP readings consistently from home without an antihypertensive medication on board. He is followed routinely by [name] [name]. Today, he admits to salt indiscretion stating he has eaten sausage, saltine crackers and drank tomato juice before coming to his appointment. He normally does not eat this much salt/sodium rich food but has been eating more than usual due to the holidays.   Past Medical History Past Medical History  Diagnosis Date  . Hypertension   . Aortic valve stenosis   . Gout   . Hx of bladder cancer   . Deep venous thrombosis     hx of  . Osteoporosis   . Renal failure   . Heart murmur   . Depression   . Chronic kidney disease   . Kidney stone   . Prostate cancer     AND BLADDER CANCER - S/P URETEROILEAL CONDUIT  . Pacemaker     BECAUSE OF SYMPTOMATIC BRADYCARDIA-[name] [name] -ELECTROPHYSIOLOGIST    Past Surgical History Past Surgical History  Procedure Date  .  Insert / replace / remove pacemaker   . Cystectomy   . Lymphadenectomy   . Prostate surgery   . Appendectomy   . Cataract extraction, bilateral   . Kidney stone surgery   . Ilial conduit     for bladder cancer  . Nephrolithotomy 09/02/2011    Procedure: NEPHROLITHOTOMY PERCUTANEOUS;  Surgeon: [name] So, MD;  Location: [facility];  Service: Urology;  Laterality: Left;     Allergies/Intolerances No Known Allergies  Current Home Medications Current Outpatient Prescriptions  Medication Sig Dispense Refill  . acetaminophen (TYLENOL) 325 MG tablet Take 650 mg by mouth every 6 (six) hours as needed. For pain      . aspirin 81 MG tablet Take 1 tablet (81 mg total) by mouth daily.  30 tablet    . Cholecalciferol (VITAMIN D-3 PO) Take by mouth. 1000 iu  daily      . colchicine 0.6 MG tablet Take 0.6 mg by mouth as needed.       . docusate sodium (COLACE) 100 MG capsule Take 100 mg by mouth 2 (two) times daily.      Marland Kitchen ibandronate (BONIVA) 150 MG tablet Take 150 mg by mouth every 30 (thirty) days. Take in the morning with a full glass of water, on an empty stomach, and do not take anything else by mouth or lie down for the next 30 min.      . multivitamin-lutein (OCUVITE-LUTEIN) CAPS Take 1 capsule by mouth daily.      Marland Kitchen  ULORIC 40 MG tablet daily.         Social History Social History  . Marital Status: Married   Social History Main Topics  . Smoking status: Former Smoker    Types: Cigarettes    Quit date: 03/25/1952  . Smokeless tobacco: Never Used  . Alcohol Use: No  . Drug Use: No   Review of Systems General: No chills, fever, night sweats or weight changes Cardiovascular: No chest pain, dyspnea on exertion, edema, orthopnea, palpitations, paroxysmal nocturnal dyspnea Dermatological: No rash, lesions or masses Respiratory: No cough, dyspnea Urologic: No hematuria, dysuria Abdominal: No nausea, vomiting, diarrhea, bright red blood per rectum, melena, or hematemesis Neurologic: No  visual changes, weakness, changes in mental status All other systems reviewed and are otherwise negative except as noted above.  Physical Exam Blood pressure 178/90, pulse 78, height 5\' 8"  (1.727 m), weight 174 lb (78.926 kg), SpO2 98.00%.  General: Well developed, well appearing 79 year old male in no acute distress. HEENT: Normocephalic, atraumatic. EOMs intact. Sclera nonicteric. Oropharynx clear.  Neck: Supple without bruits. No JVD. Lungs: Respirations regular and unlabored, CTA bilaterally. No wheezes, rales or rhonchi. Heart: RRR. S1, S2 present. II/VI systolic murmur. No rub, S3 or S4. Abdomen: Soft, non-distended.  Extremities: No clubbing, cyanosis or edema.  Psych: Normal affect. Neuro: Alert and oriented X 3. Moves all extremities spontaneously.   Diagnostics Device interrogation performed by me in clinic shows normal PPM function with good battery status and stable lead measurements/parameters; no episodes; histograms appropriate; no programming changes made; see PaceArt report  Assessment and Plan 1. Sinus node dysfunction s/p PPM Normal device function Return to clinic for follow-up with Dr. Lovena [name] in 6 months 2. Hypertension BP elevated today As outlined in HPI, [name] [name] admits to increased salt intake Not a candidate for ACEI/ARB due to CKD Has taken amlodipine previously but BP was stable off of this medication 1 year ago [name] discontinued Also with history of orthostatic hypotension Spoke with [name] [name] via phone No changes made to medications today but will have [name] [name] keep a BP log at home x 2-3 days, then call [name] [name] with results  Signed, [name] Hutchinson, PA-C 04/07/2012, 3:03 PM

## 2012-04-07 NOTE — Patient Instructions (Addendum)
Your physician wants you to follow-up in: 6 months with Dr Knox Saliva will receive a reminder letter in the mail two months in advance. If you don't receive a letter, please call our office to schedule the follow-up appointment.   Your physician recommends that you schedule a follow-up appointment with Dr Joylene Draft

## 2012-04-21 IMAGING — CR DG CHEST 2V
2 series · 2 of 2 positions shown · non-contrast
Comparison: 05/15/2009.  04/04/2008.  12/11/2007.

CLINICAL DATA: Preoperative respiratory evaluation.

CHEST - 2 VIEW

[w chest pa]
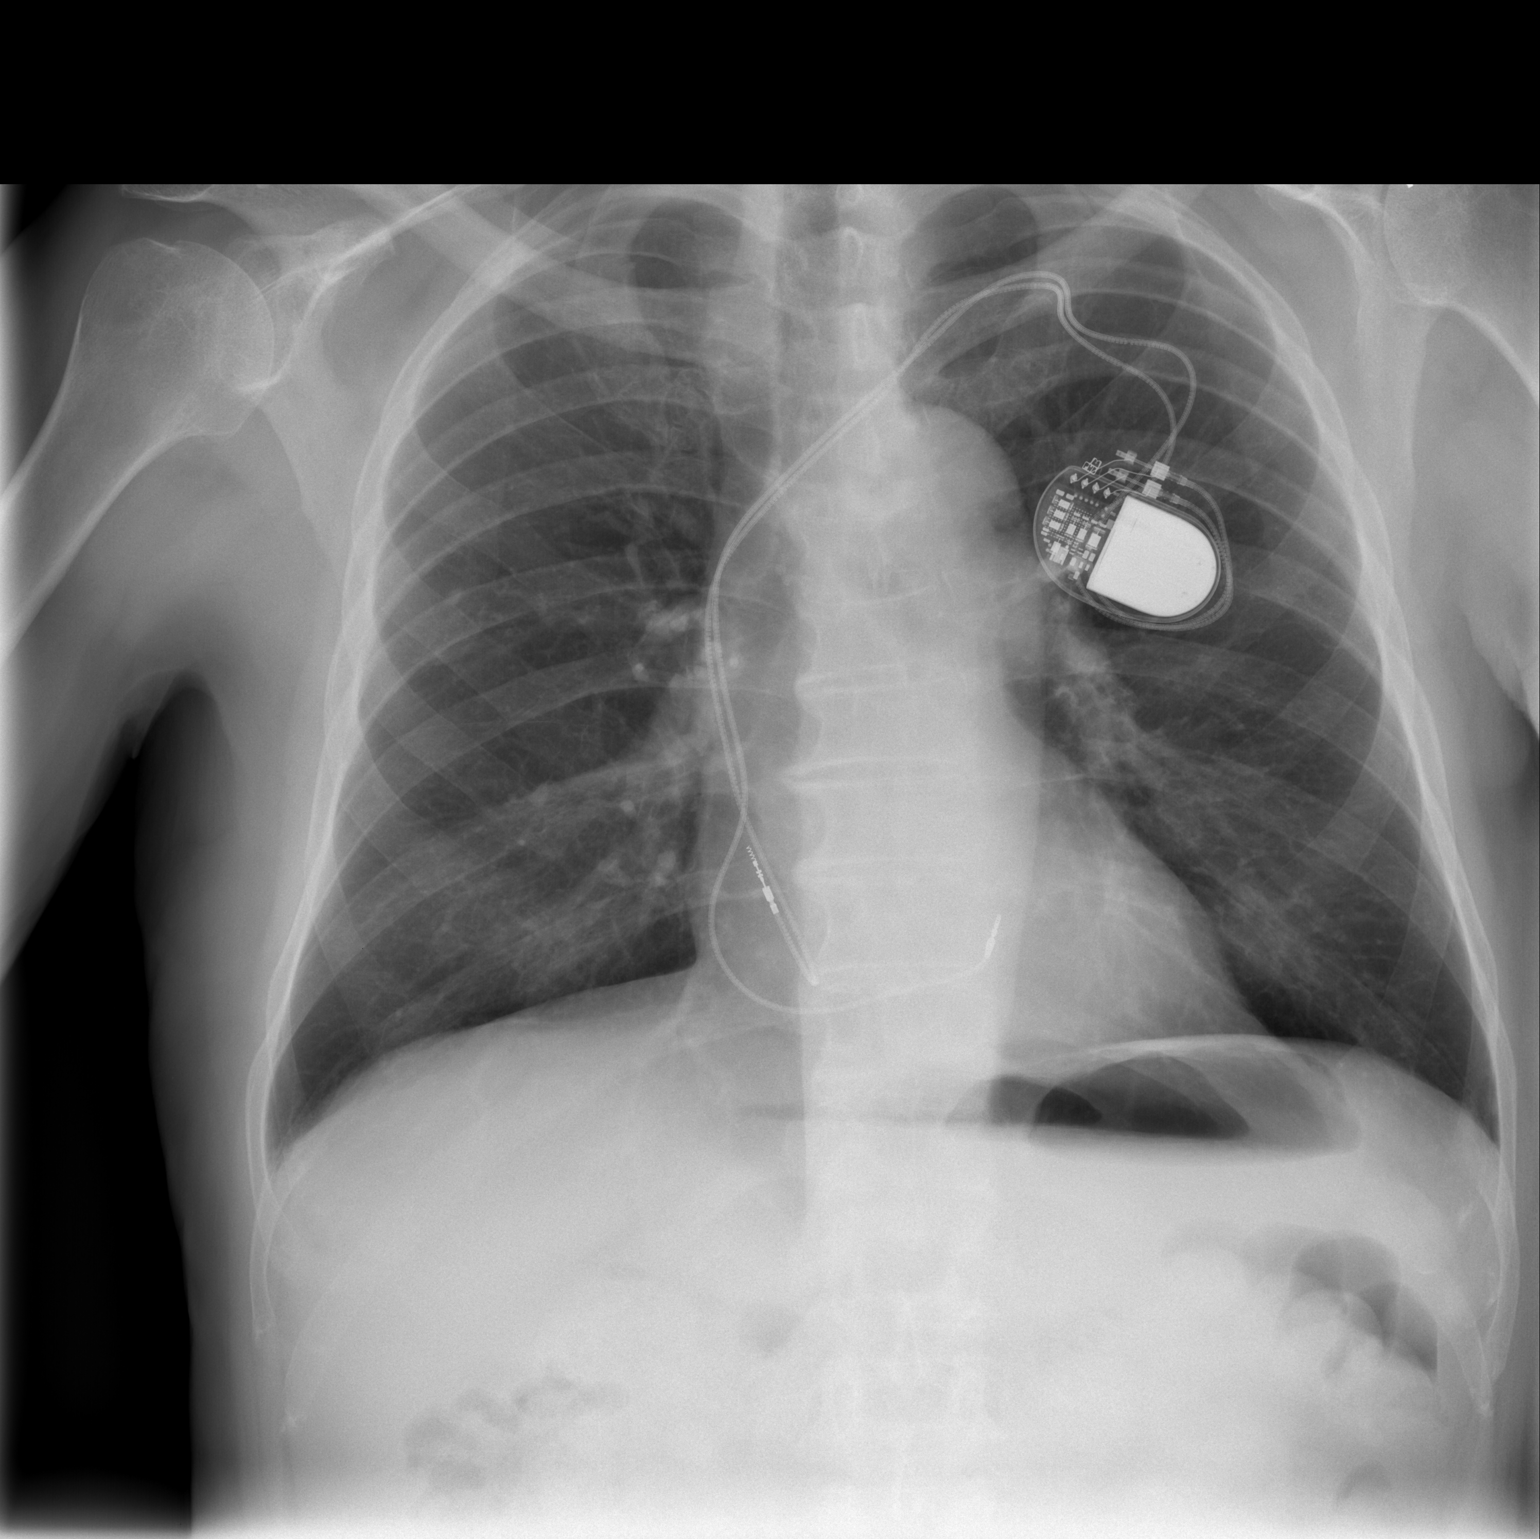

[w chest lat]
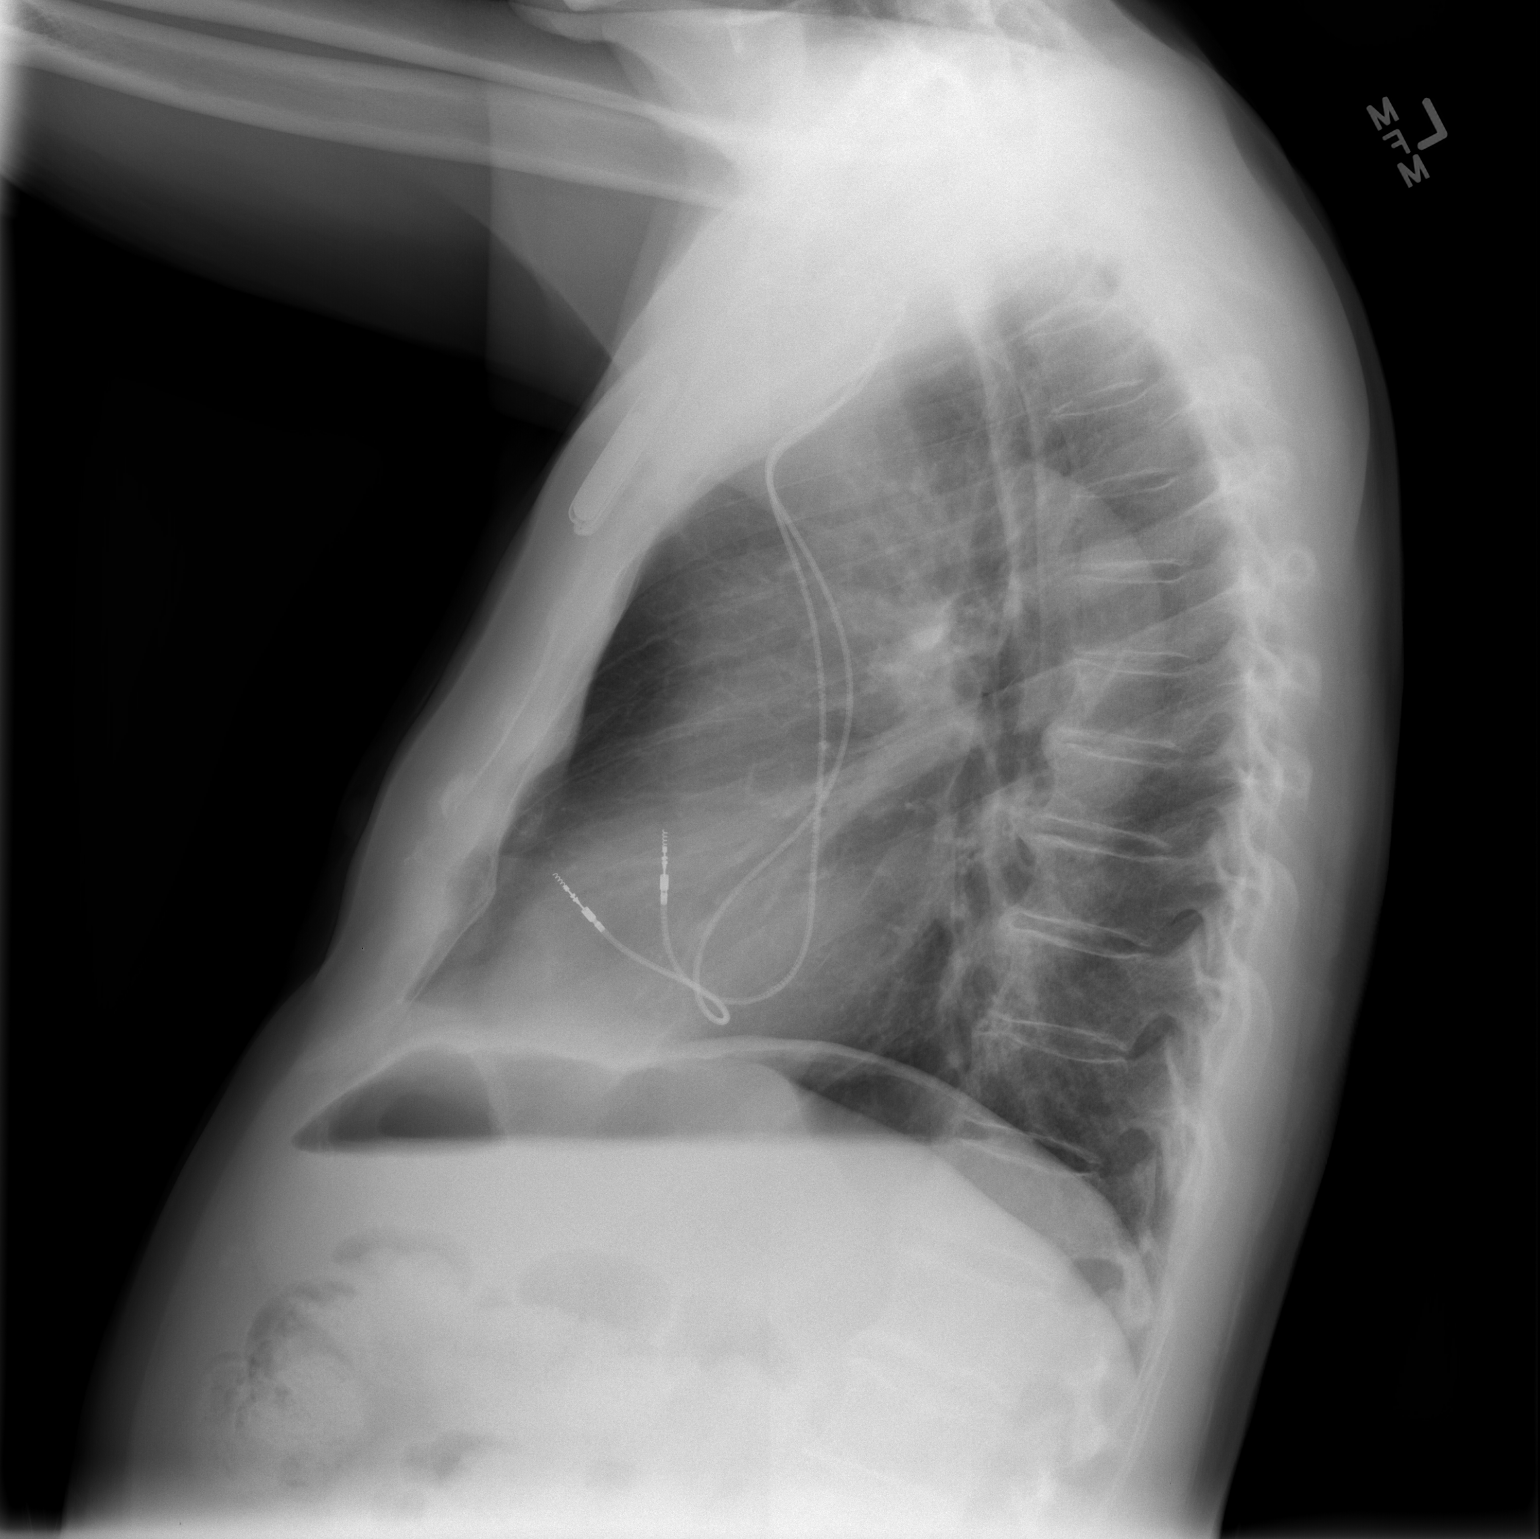

[2 of 2 positions shown; findings below may reference images not displayed]

FINDINGS: Lungs are clear.  Tiny nodule in the peripheral right
midlung is stable consistent with granuloma.  The cardiopericardial
silhouette is within normal limits for size. Left dual lead
permanent pacemaker remains in place.
IMPRESSION: Stable.  No acute cardiopulmonary process.

## 2012-04-23 ENCOUNTER — Encounter: Payer: Self-pay | Admitting: Internal Medicine

## 2012-04-28 IMAGING — CR DG ABDOMEN 1V
2 series · 2 of 2 positions shown · non-contrast
Comparison: 07/10/2010

***ADDENDUM*** CREATED: 07/11/2010 [DATE]

After further review of the images with Dr. Habibullina, density  in the
right abdomen is most likely  stool rather than  retained renal
calculi.
***END ADDENDUM*** SIGNED BY: Sudar Zeno, M.D.
CLINICAL DATA: Follow-up right partial staghorn.
ABDOMEN - 1 VIEW

[view not recorded (1 of 2)]
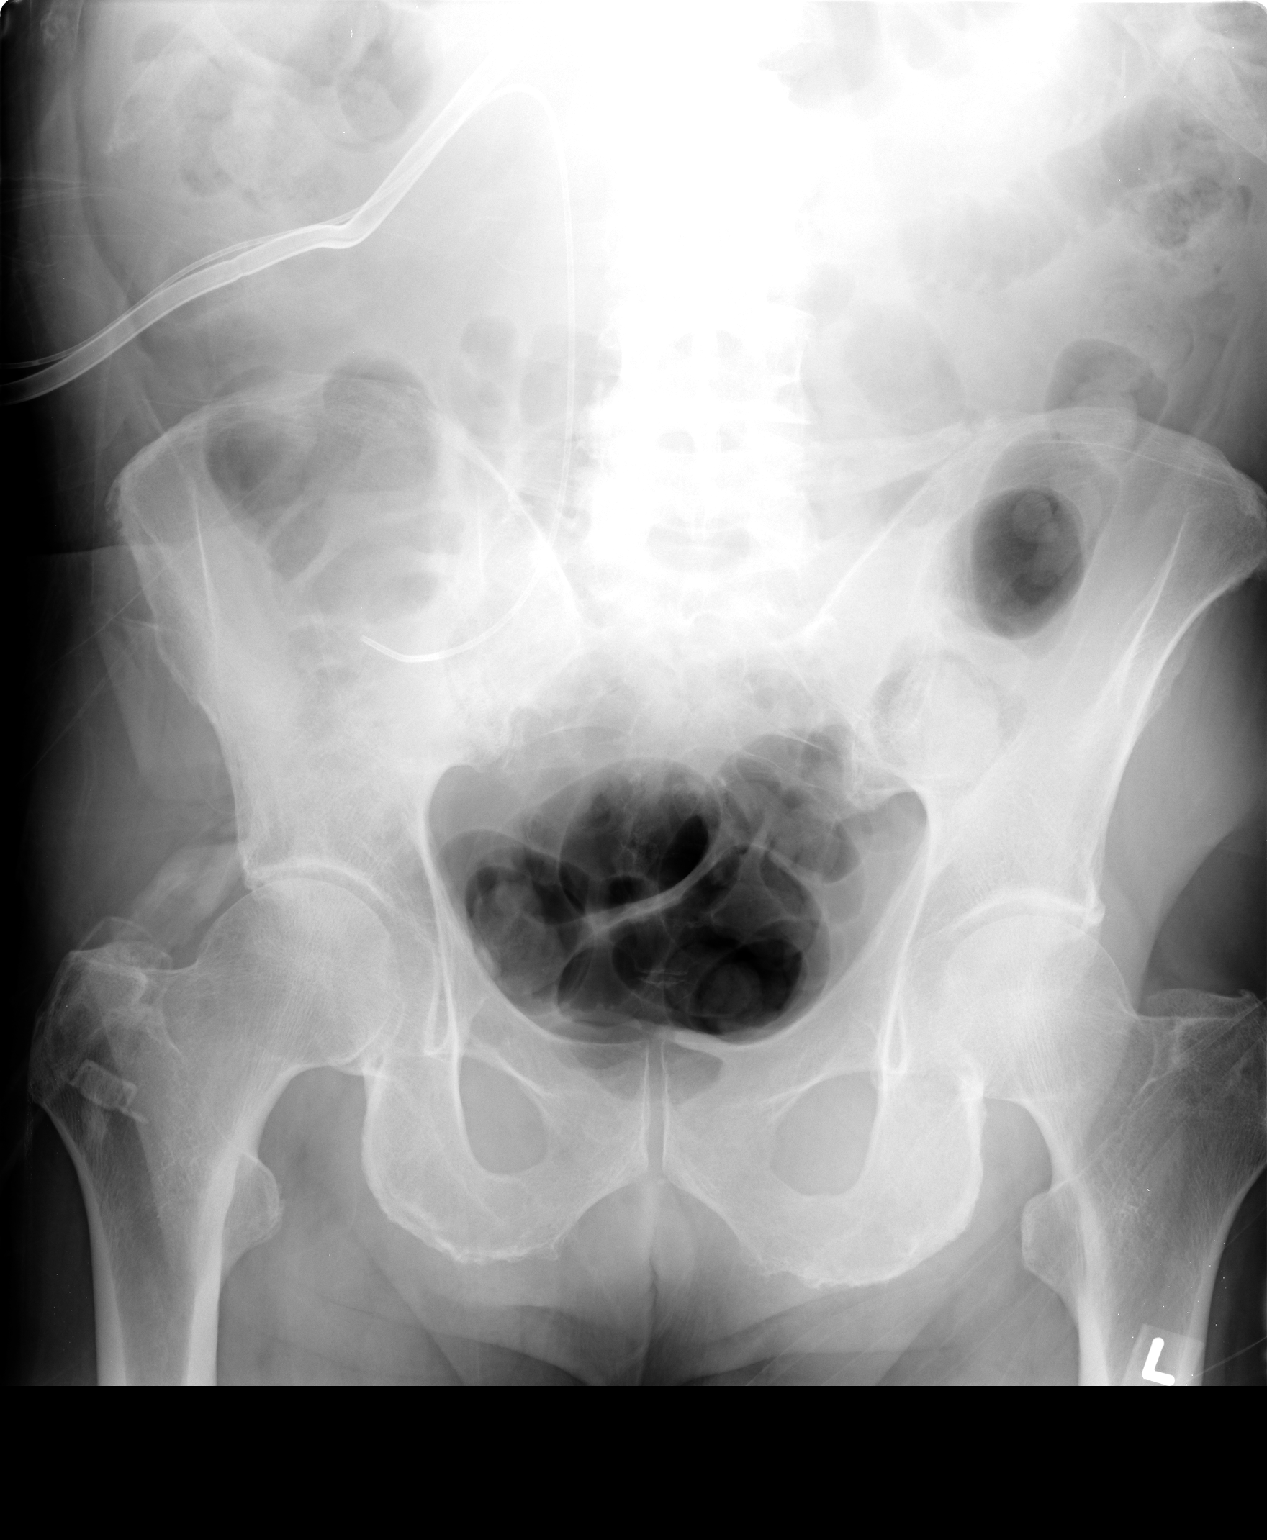

[view not recorded (2 of 2)]
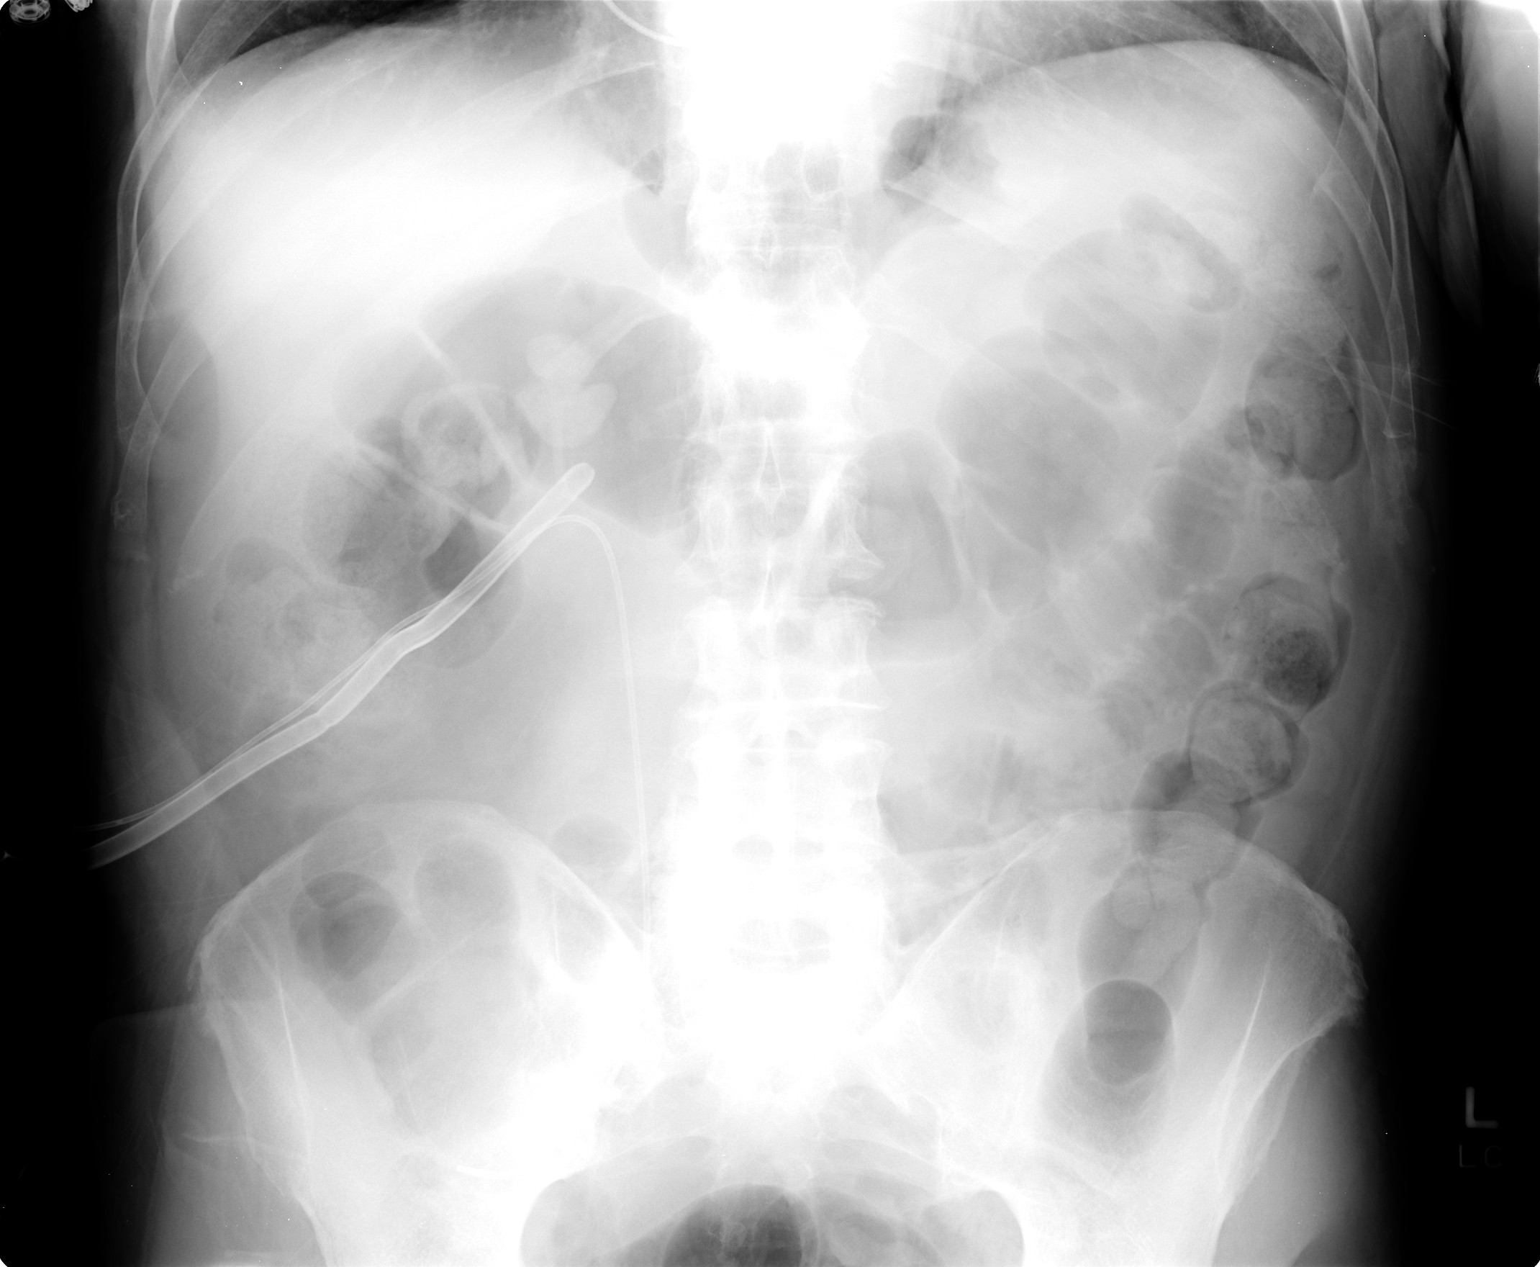

[2 of 2 positions shown; findings below may reference images not displayed]

FINDINGS: Large calcification projected over the upper pole of the
right kidney consistent with history of staghorn calculus.  The
colon overlies this and colonic stool could have a similar
appearance.  Catheters positioned on the right consistent with
large or percutaneous nephrostomy tube and right ureteral stent.
The distal portion of the stent extends over the more lateral iliac
bone. This is consistent with location and side urinary conduit.
IMPRESSION: Large right renal stones demonstrated.  Percutaneous nephrostomy
and ureteral catheters.

## 2012-10-01 ENCOUNTER — Encounter: Payer: Self-pay | Admitting: Internal Medicine

## 2012-10-01 ENCOUNTER — Ambulatory Visit (INDEPENDENT_AMBULATORY_CARE_PROVIDER_SITE_OTHER): Payer: Medicare Other | Admitting: Internal Medicine

## 2012-10-01 VITALS — BP 148/85 | HR 78 | Ht 68.0 in | Wt 179.2 lb

## 2012-10-01 DIAGNOSIS — I498 Other specified cardiac arrhythmias: Secondary | ICD-10-CM

## 2012-10-01 DIAGNOSIS — Z950 Presence of cardiac pacemaker: Secondary | ICD-10-CM

## 2012-10-01 DIAGNOSIS — I10 Essential (primary) hypertension: Secondary | ICD-10-CM

## 2012-10-01 DIAGNOSIS — R001 Bradycardia, unspecified: Secondary | ICD-10-CM

## 2012-10-01 LAB — PACEMAKER DEVICE OBSERVATION
AL IMPEDENCE PM: 486 Ohm
AL THRESHOLD: 0.5 V
ATRIAL PACING PM: 98
BATTERY VOLTAGE: 2.79 V

## 2012-10-01 NOTE — Assessment & Plan Note (Signed)
His Medtronic dual-chamber pacemaker is working normally. We'll plan to recheck in several months.

## 2012-10-01 NOTE — Progress Notes (Signed)
HPI [name] returns today for followup. He is a very pleasant 79 year old man with a history of symptomatic bradycardia, status post permanent pacemaker insertion  ,hypertension,who returns today for followup. In the interim, he has done well. He raises his garden and remains active despite his advanced age. He denies chest pain, shortness of breath, or syncope. No peripheral edema. No Known Allergies   Current Outpatient Prescriptions  Medication Sig Dispense Refill  . aspirin 81 MG tablet Take 1 tablet (81 mg total) by mouth daily.  30 tablet    . Cholecalciferol (VITAMIN D-3 PO) Take by mouth. 1000 iu  daily      . docusate sodium (COLACE) 100 MG capsule Take 100 mg by mouth 2 (two) times daily.      . multivitamin-lutein (OCUVITE-LUTEIN) CAPS Take 1 capsule by mouth daily.      Marland Kitchen ULORIC 40 MG tablet daily.       . vitamin C (ASCORBIC ACID) 500 MG tablet Take 500 mg by mouth daily.      . vitamin E (VITAMIN E) 400 UNIT capsule Take 400 Units by mouth daily.      Marland Kitchen amLODipine (NORVASC) 2.5 MG tablet Take 2.5 mg by mouth daily.       No current facility-administered medications for this visit.     Past Medical History  Diagnosis Date  . Hypertension   . Aortic valve stenosis   . Gout   . Hx of bladder cancer   . Deep venous thrombosis     hx of  . Osteoporosis   . Renal failure   . Heart murmur   . Depression   . Chronic kidney disease   . Kidney stone   . Prostate cancer     AND BLADDER CANCER - S/P URETEROILEAL CONDUIT  . 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 GREGG TAYLOR -ELECTROPHYSIOLOGIST    ROS:   All systems reviewed and negative except as noted in the HPI.   Past Surgical History  Procedure Laterality Date  . Insert / replace / remove pacemaker    . Cystectomy    . Lymphadenectomy    . Prostate surgery    . Appendectomy    . Cataract extraction, bilateral    . Kidney stone surgery    . Ilial conduit      for bladder cancer  .  Nephrolithotomy  09/02/2011    Procedure: NEPHROLITHOTOMY PERCUTANEOUS;  Surgeon: Malka So, MD;  Location: [facility];  Service: Urology;  Laterality: Left;     Family History  Problem Relation Age of Onset  . Prostate cancer Father 73    died  . Pancreatic cancer Mother 58    died     History   Social History  . Marital Status: Married    Spouse Name: N/A    Number of Children: 3  . Years of Education: N/A   Occupational History  .     Social History Main Topics  . Smoking status: Former Smoker    Types: Cigarettes    Quit date: 03/25/1952  . Smokeless tobacco: Never Used  . Alcohol Use: No  . Drug Use: No  . Sexually Active: No   Other Topics Concern  . Not on file   Social History Narrative  . No narrative on file     BP 148/85  Pulse 78  Ht 5\' 8"  (1.727 m)  Wt 179 lb 3.2 oz (81.285 kg)  BMI 27.25 kg/m2  Physical Exam:  Well appearing elderly man, NAD HEENT: Unremarkable Neck:  7 cm JVD, no thyromegally Back:  No CVA tenderness Lungs:  Clear with no wheezes, rales, or rhonchi. Well-healed pacemaker incision HEART:  Regular rate rhythm, no murmurs, no rubs, no clicks Abd:  soft, positive bowel sounds, no organomegally, no rebound, no guarding Ext:  2 plus pulses, no edema, no cyanosis, no clubbing Skin:  No rashes no nodules Neuro:  CN II through XII intact, motor grossly intact  DEVICE  Normal device function.  See PaceArt for details.   Assess/Plan:

## 2012-10-01 NOTE — Assessment & Plan Note (Signed)
His blood pressure is slightly elevated. He notes it is better control at home. He will continue his current medical therapy. He will maintain a low-sodium diet.

## 2012-10-01 NOTE — Patient Instructions (Signed)
Your physician wants you to follow-up in: [place] will receive a reminder letter in the mail two months in advance. If you don't receive a letter, please call our office to schedule the follow-up appointment.

## 2012-11-20 ENCOUNTER — Ambulatory Visit (INDEPENDENT_AMBULATORY_CARE_PROVIDER_SITE_OTHER): Payer: Medicare Other | Admitting: Ophthalmology

## 2013-03-03 ENCOUNTER — Ambulatory Visit (INDEPENDENT_AMBULATORY_CARE_PROVIDER_SITE_OTHER): Payer: Medicare Other | Admitting: *Deleted

## 2013-03-03 DIAGNOSIS — I498 Other specified cardiac arrhythmias: Secondary | ICD-10-CM

## 2013-03-03 DIAGNOSIS — R001 Bradycardia, unspecified: Secondary | ICD-10-CM

## 2013-03-03 LAB — MDC_IDC_ENUM_SESS_TYPE_INCLINIC
Battery Remaining Longevity: 97 mo
Battery Voltage: 2.79 V
Brady Statistic AP VP Percent: 2 %
Brady Statistic AP VS Percent: 97 %
Brady Statistic AS VP Percent: 0 %
Date Time Interrogation Session: 20141210122926
Lead Channel Pacing Threshold Amplitude: 0.5 V
Lead Channel Pacing Threshold Pulse Width: 0.4 ms
Lead Channel Sensing Intrinsic Amplitude: 1 mV
Lead Channel Sensing Intrinsic Amplitude: 5.6 mV
Lead Channel Setting Pacing Amplitude: 2.5 V
Lead Channel Setting Pacing Pulse Width: 0.4 ms

## 2013-03-03 NOTE — Progress Notes (Signed)
Pacemaker check in clinic. Normal device function. Thresholds, sensing, impedances consistent with previous measurements. Device programmed to maximize longevity. No mode switch or high ventricular rates noted. Device programmed at appropriate safety margins. Histogram distribution appropriate for patient activity level. Device programmed to optimize intrinsic conduction. Estimated longevity 8 years.  Patient education completed.  ROV 6 months with Dr. Lovena Le.

## 2013-03-23 ENCOUNTER — Encounter: Payer: Self-pay | Admitting: Internal Medicine

## 2013-05-07 ENCOUNTER — Ambulatory Visit (INDEPENDENT_AMBULATORY_CARE_PROVIDER_SITE_OTHER): Payer: Medicare Other | Admitting: Ophthalmology

## 2013-05-07 DIAGNOSIS — H3530 Unspecified macular degeneration: Secondary | ICD-10-CM

## 2013-05-07 DIAGNOSIS — I10 Essential (primary) hypertension: Secondary | ICD-10-CM

## 2013-05-07 DIAGNOSIS — H35039 Hypertensive retinopathy, unspecified eye: Secondary | ICD-10-CM

## 2013-05-07 DIAGNOSIS — H43819 Vitreous degeneration, unspecified eye: Secondary | ICD-10-CM

## 2013-06-03 IMAGING — XA IR PERC NEPHROSTOMY*L*
1 series · 2 of 2 positions shown · non-contrast
Comparison: CT of the abdomen pelvis performed earlier today.

CLINICAL DATA: Left renal obstruction due to UPJ calculus.  Status
post prior cystectomy and ileal conduit formation.  Elevated white
blood cell count.

1.  ULTRASOUND GUIDANCE FOR PUNCTURE OF THE LEFT RENAL COLLECTING
SYSTEM.
2.  LEFT PERCUTANEOUS NEPHROSTOMY TUBE PLACEMENT

[Series 300: ir nephrostogram*l* · 2 of 2 slices shown]
[im 1/2]
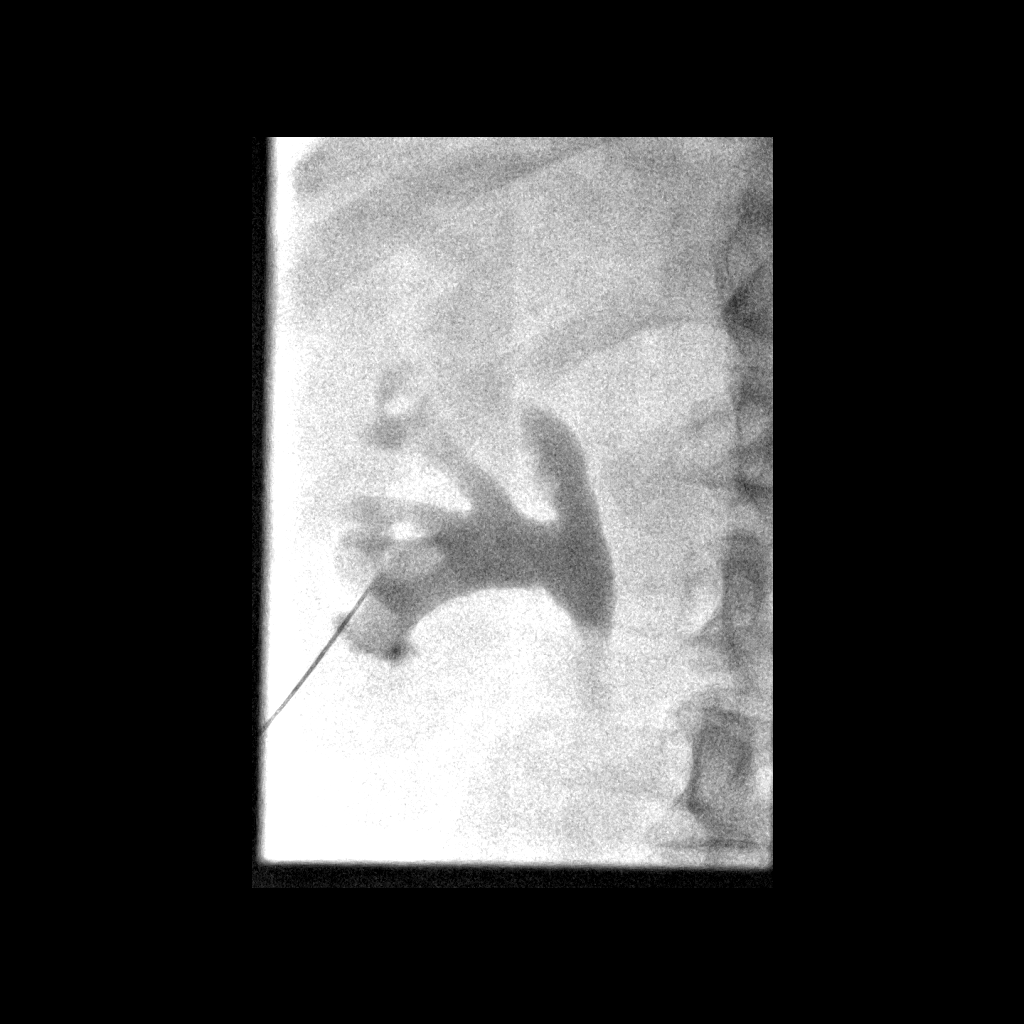
[im 2/2]
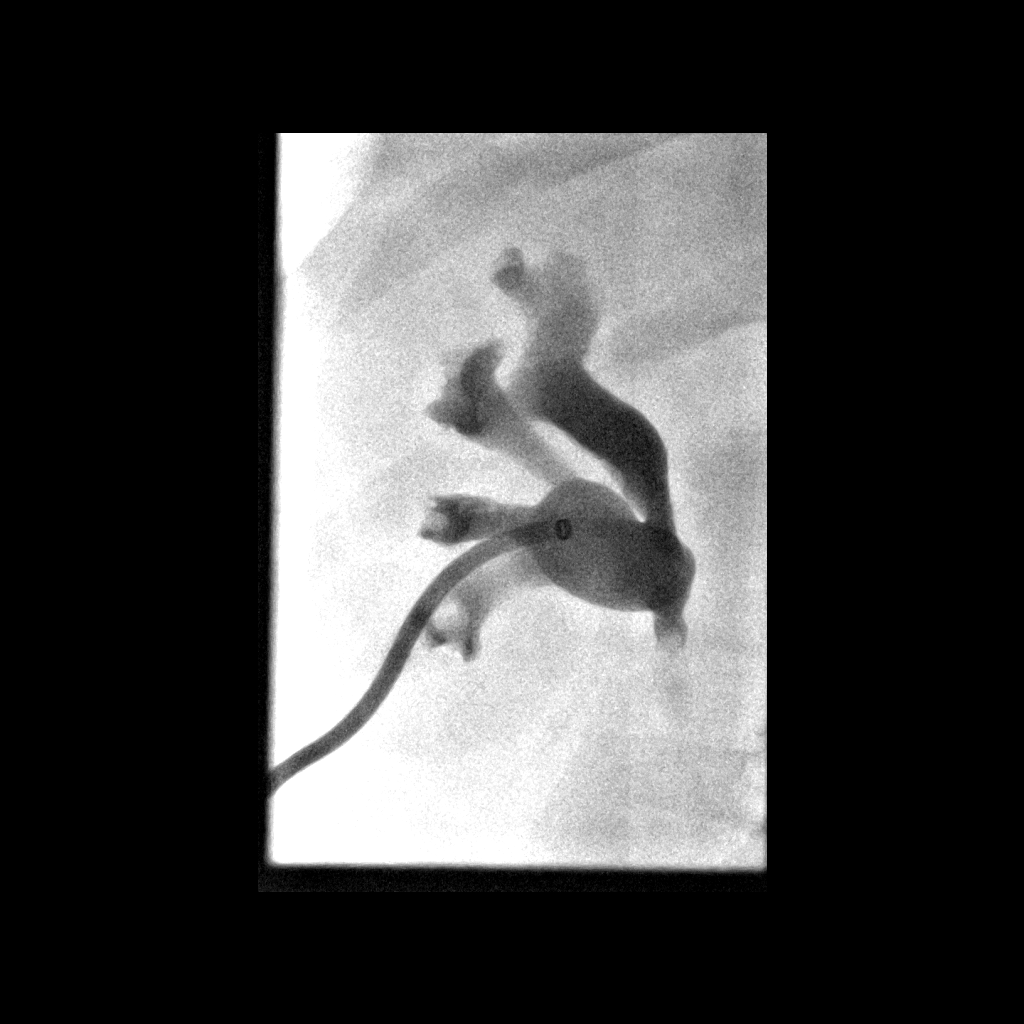

[2 of 2 positions shown; findings below may reference images not displayed]

Sedation: 1.0 mg IV Versed; 25 mcg IV Fentanyl.

Total Moderate Sedation Time: 18minutes.

Contrast:  10 ml Omnipaque 300

Fluoroscopy Time: 1.5 minutes.

Procedure:  The procedure, risks, benefits, and alternatives were
explained to the patient.  Questions regarding the procedure were
encouraged and answered.  The patient understands and consents to
the procedure.

The left flank region was prepped with Betadine in a sterile
fashion, and a sterile drape was applied covering the operative
field.  A sterile gown and sterile gloves were used for the
procedure. Local anesthesia was provided with 1% Lidocaine.

Ultrasound was used to localize the left kidney.  Under direct
ultrasound guidance, a 21 gauge needle was advanced into the renal
collecting system.  Ultrasound image documentation was performed.
Aspiration of urine sample was performed followed by contrast
injection.  A urine sample was sent for culture analysis.

A transitional dilator was advanced over a guidewire.  Percutaneous
tract dilatation was then performed over the guidewire.  A 10-
French percutaneous nephrostomy tube was then advanced and formed
in the collecting system.  Catheter position was confirmed by
fluoroscopy after contrast injection.

The catheter was secured at the skin with a Prolene retention
suture and Stat-Lock device.  A gravity bag was placed.

Complications: None
FINDINGS: Ultrasound confirms moderately severe left
hydronephrosis.  Aspiration yielded grossly purulent and bloody
urine from the renal collecting system.  The 10-French nephrostomy
tube was formed at the level of the renal pelvis.  Contrast
injection confirms the presence of high-grade obstruction related
to a calculus lodged at the UPJ and proximal ureter.
IMPRESSION: Left percutaneous nephrostomy tube placement as above.  Aspiration
yielded grossly purulent and bloody fluid consistent with
pyonephrosis.  A 10-French catheter was formed at the level of the
renal pelvis.  Contrast injection confirms the presence of high-
grade obstruction due to a UPJ calculus.

## 2013-06-03 IMAGING — CR DG CHEST 1V
1 series · 1 of 1 positions shown · non-contrast
Comparison: 07/04/2010

CLINICAL DATA: Dyspnea, shortness of breath, weakness, nephrostomy
tube placed

CHEST - 1 VIEW

[AP]
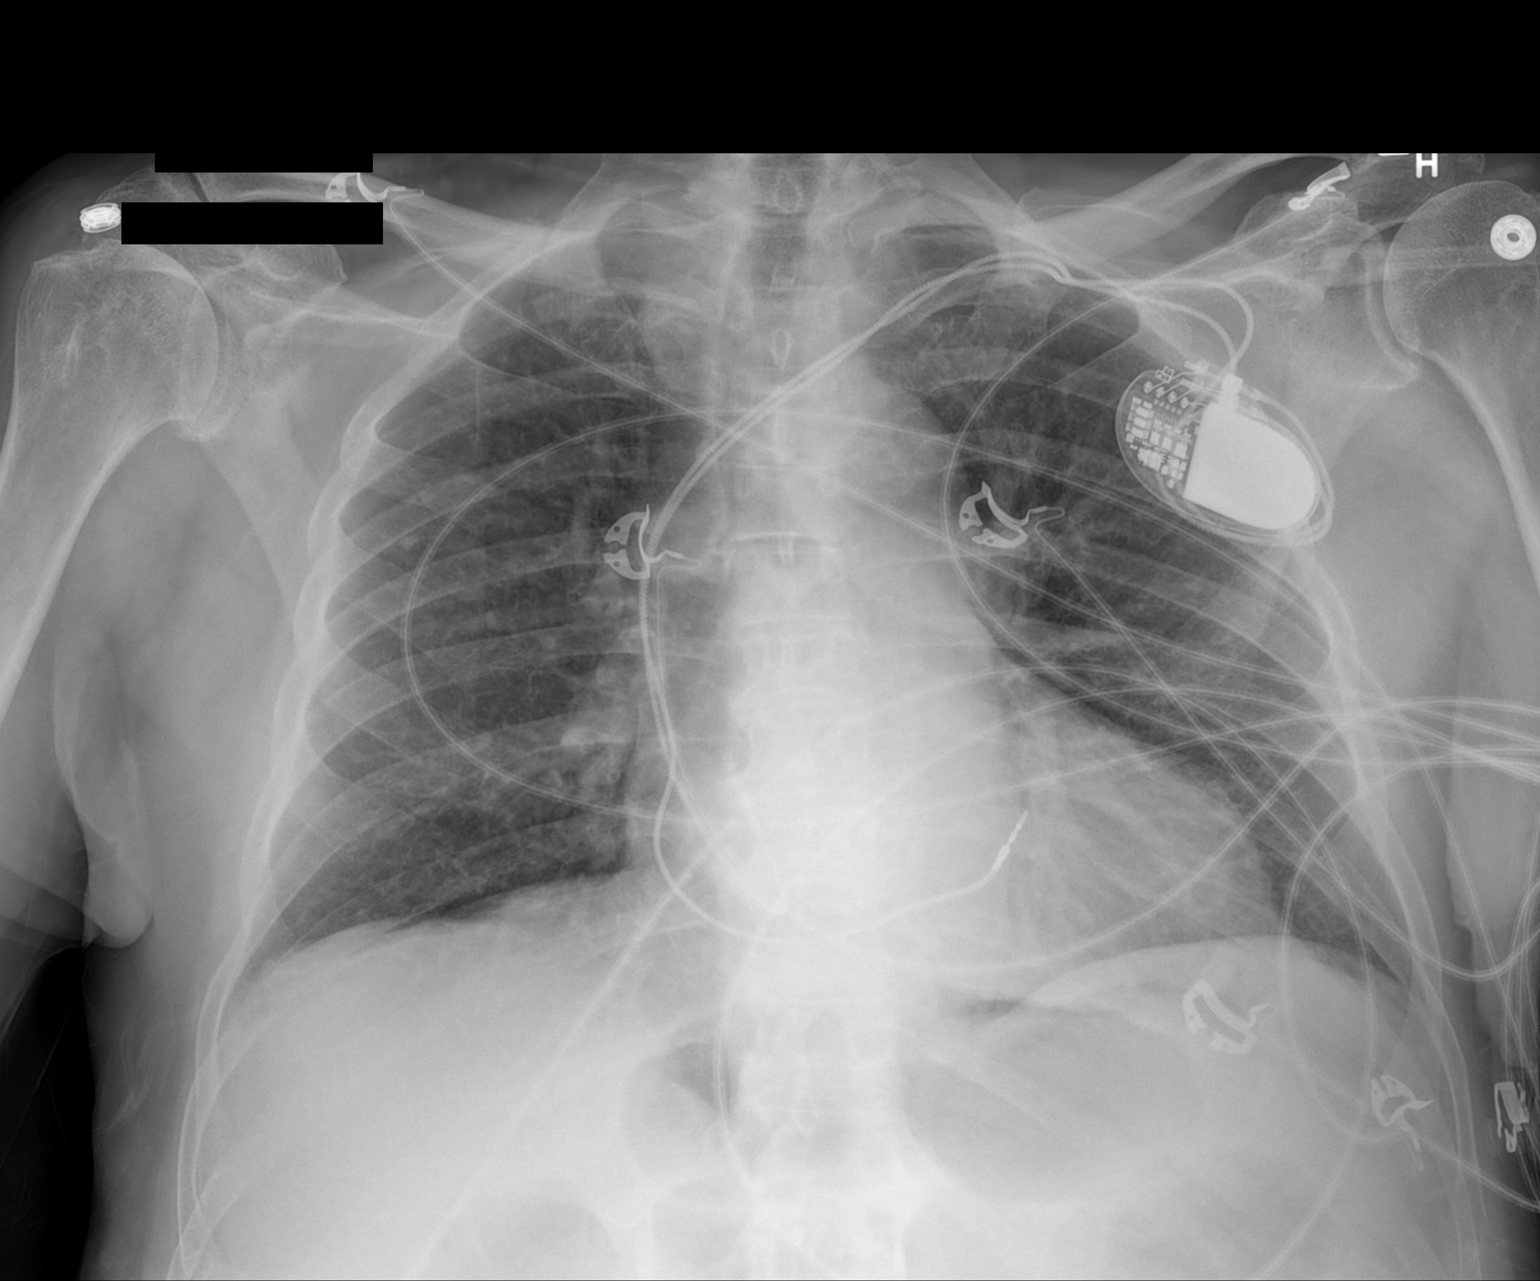

[1 of 1 positions shown; findings below may reference images not displayed]

FINDINGS: Lungs are essentially clear.  Stable calcified granuloma
in the lateral right upper lobe.  No pleural effusion or
pneumothorax.

The heart is top normal in size for inspiration.  Left subclavian
pacemaker.
IMPRESSION: No evidence of acute cardiopulmonary disease.

## 2013-06-03 IMAGING — CT CT ABD-PELV W/O CM
1 series · 13 of 32 positions shown, 16 images · non-contrast
Comparison: 05/09/2010

CLINICAL DATA: Left flank pain and history of bilateral renal
calculi including prior right nephrolithotomy on 07/10/2010.
History of bladder carcinoma with prior cystectomy and ileal
conduit formation.

CT ABDOMEN AND PELVIS WITHOUT CONTRAST
TECHNIQUE: Multidetector CT imaging of the abdomen and pelvis was
performed following the standard protocol without intravenous
contrast.

[Series 6: sagittal · sagittal · 0.68mm/px · 13 of 128 slices shown, 16 images]
[im 5/128  lung]
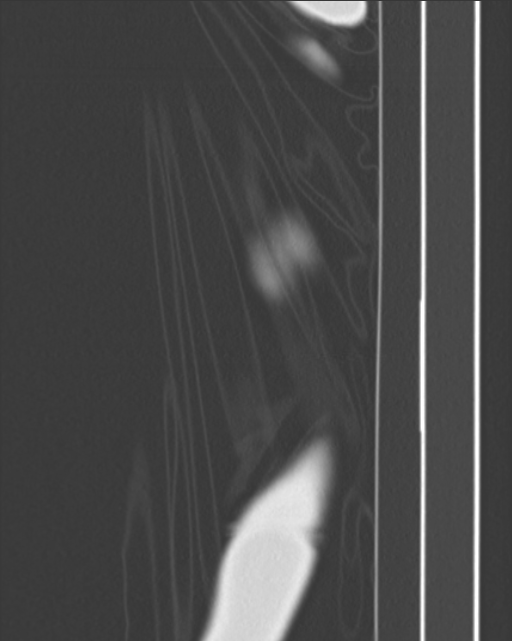
[im 9/128  soft-tissue]
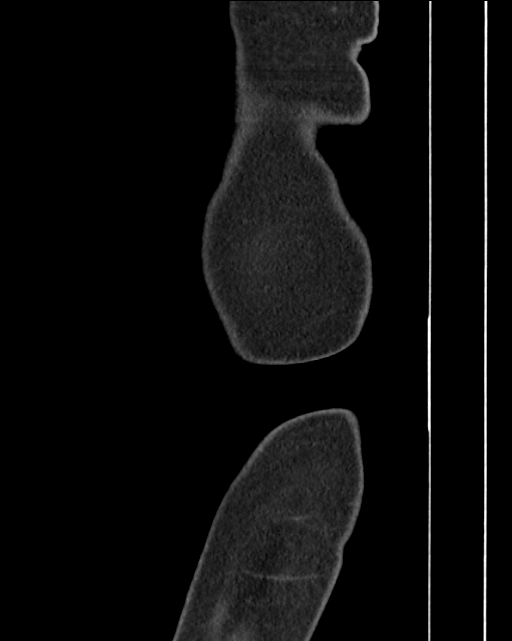
[im 9/128  lung]
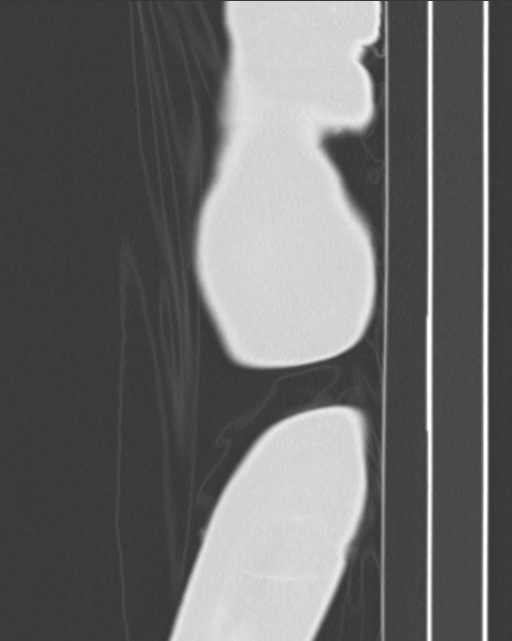
[im 9/128  bone]
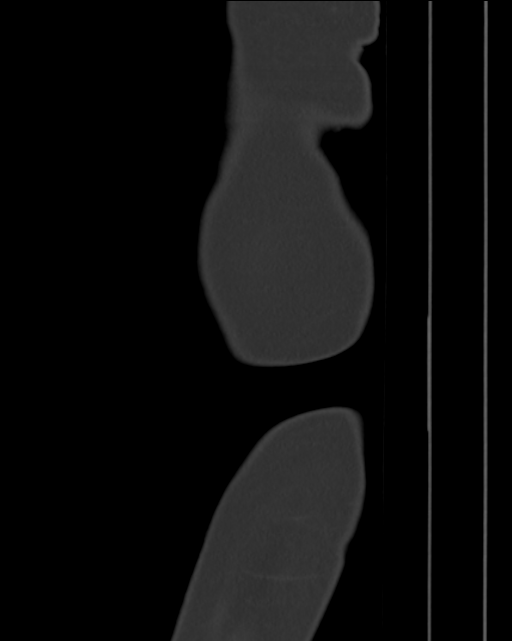
[im 13/128  lung]
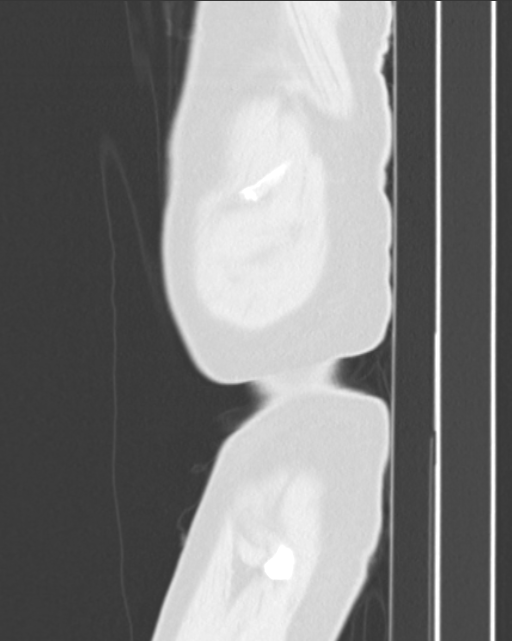
[im 17/128  lung]
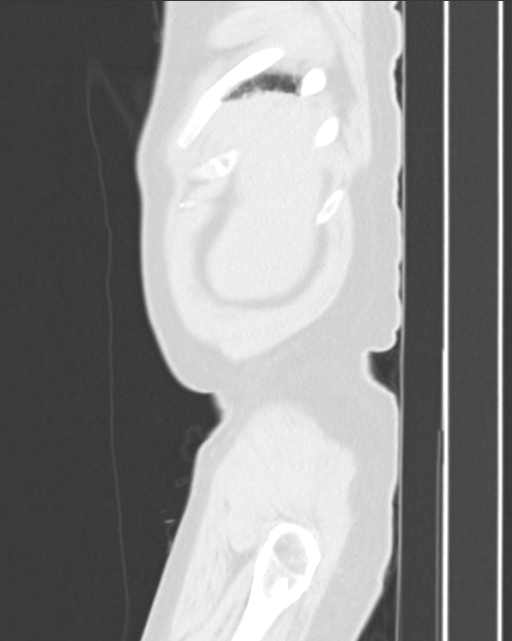
[im 21/128  soft-tissue]
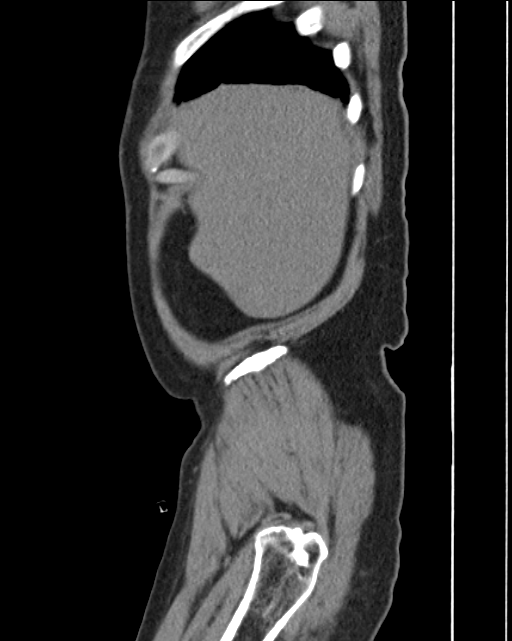
[im 33/128  soft-tissue]
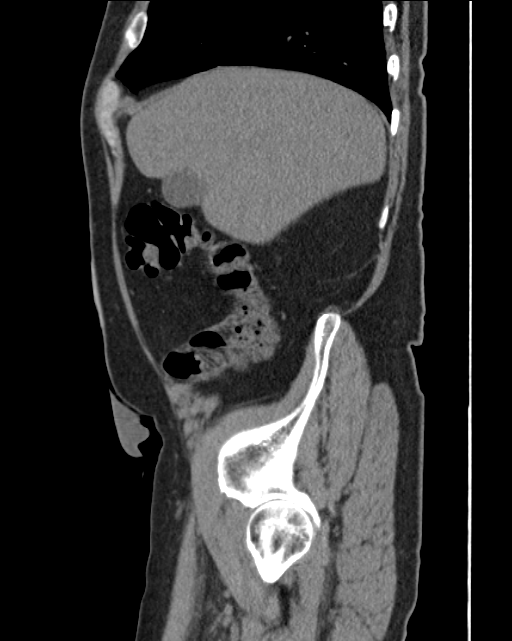
[im 46/128  soft-tissue]
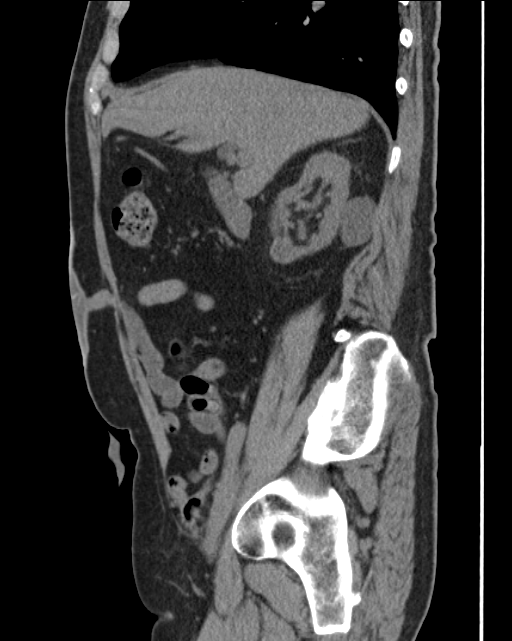
[im 58/128  soft-tissue]
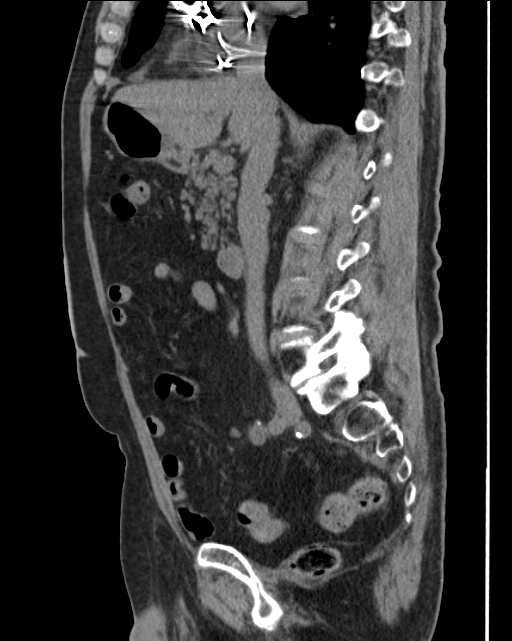
[im 70/128  soft-tissue]
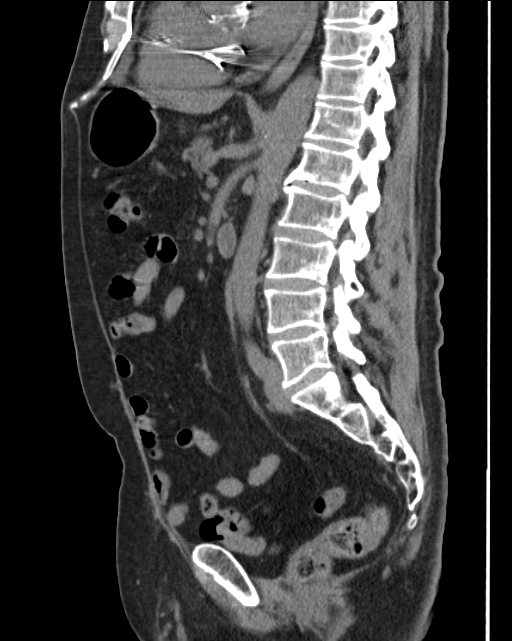
[im 82/128  soft-tissue]
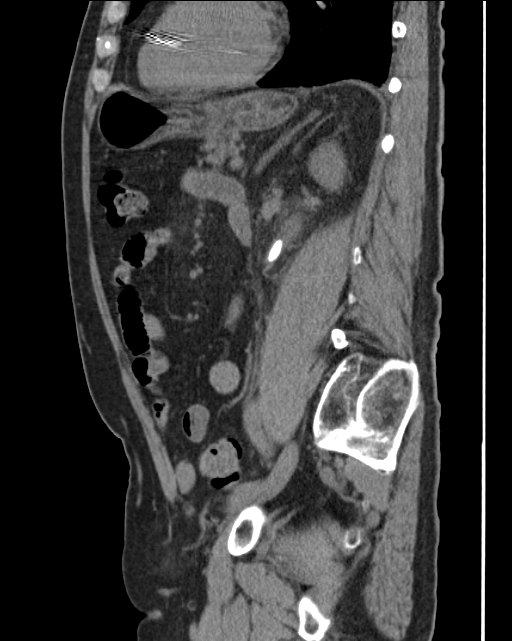
[im 95/128  soft-tissue]
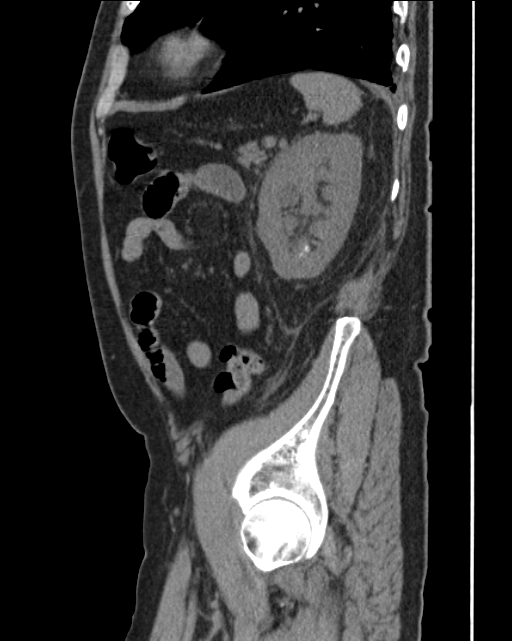
[im 107/128  soft-tissue]
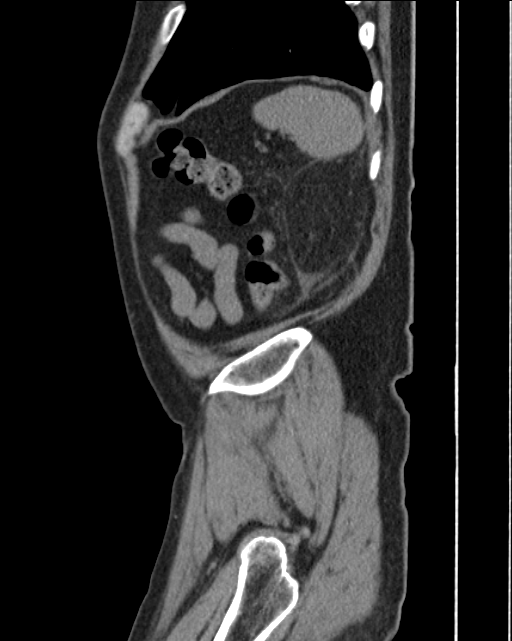
[im 107/128  bone]
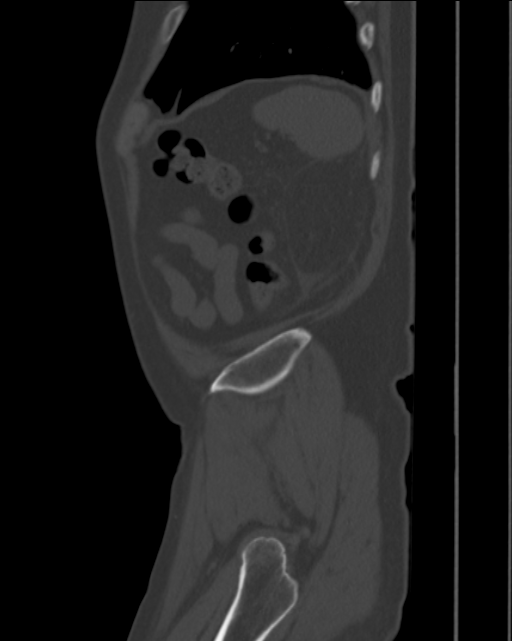
[im 119/128  soft-tissue]
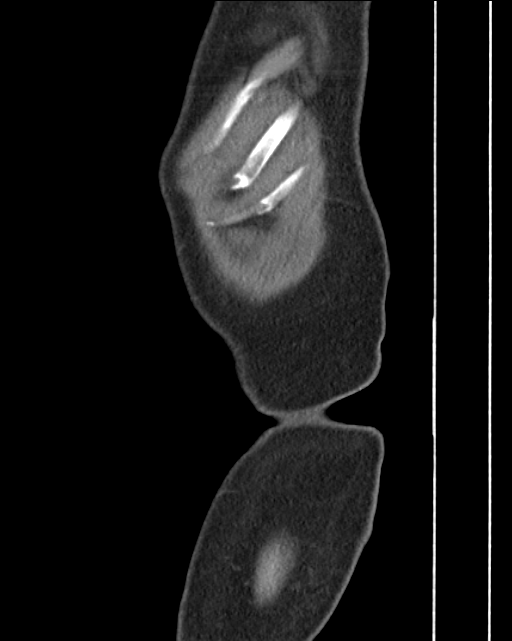

[13 of 32 positions shown; findings below may reference images not displayed]

FINDINGS: There is evidence of moderately severe left-sided
hydronephrosis secondary to an obstructing calculus at the
ureteropelvic junction measuring approximately 5 x 9 x 12 mm.
Nonobstructing lower pole calculi are present measuring
approximately 3 mm and 5 mm in respective greatest diameter.  No
other left-sided calculi or calcifications in the ileal conduit are
identified.  The ileal conduit is nondistended and shows no gross
abnormalities at the level of a right lower quadrant ileostomy.
There is a tiny nonobstructing calculus in the lower pole of the
right kidney.  The right kidney is relatively atrophic compared to
the left.  Stable renal cyst in the posterior right kidney.

Stable gallstones.  No abnormal fluid collections.  Other
unenhanced solid organs and bowel are unremarkable.  Stable left
inguinal hernia containing fat.
IMPRESSION: Moderately severe left hydronephrosis secondary to an obstructing
calculus at the UPJ measuring 12 mm in greatest dimensions.  Other
nonobstructing calculi are present bilaterally.

## 2013-06-20 IMAGING — RF DG ABDOMEN 1V
1 series · 1 of 1 positions shown · non-contrast
Comparison: None.

CLINICAL DATA: Left ureteral pelvic junction stone.

ABDOMEN - 1 VIEW

[Series 1: run · 1 of 1 slices shown]
[im 1/1]
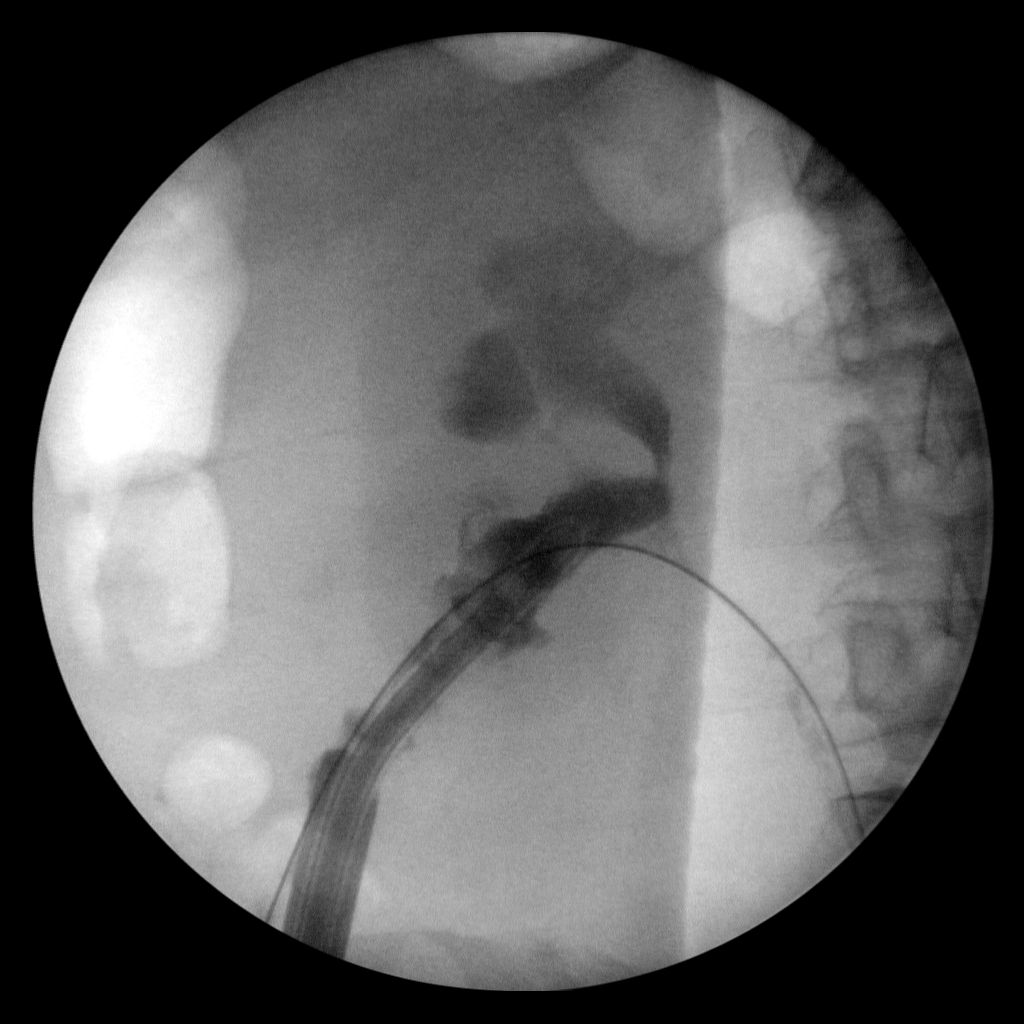

[1 of 1 positions shown; findings below may reference images not displayed]

FINDINGS: Single C-arm image demonstrates a guide wire in the left
renal collecting system as well as a drainage catheter.  There is
no visible stone on the available image.
IMPRESSION: Left ureteral pelvic junction stone no longer apparent.

## 2013-06-21 IMAGING — CT CT ABDOMEN W/O CM
2 of 4 series · 16 of 46 positions shown, 18 images · non-contrast
Comparison: 08/16/2011

CLINICAL DATA: Status post left percutaneous nephrolithotomy,
evaluate for residual stones, status post cystoprostatectomy for
bladder and prostate cancer

CT ABDOMEN WITHOUT CONTRAST
TECHNIQUE: Multidetector CT imaging of the abdomen was performed
following the standard protocol without IV contrast.

[Series 2: rtn a/p w/o · axial · non-contrast · 0.76mm/px · z∈[-370,-166]mm · 13 of 45 slices shown, 15 images]
[im 2/45  soft-tissue]
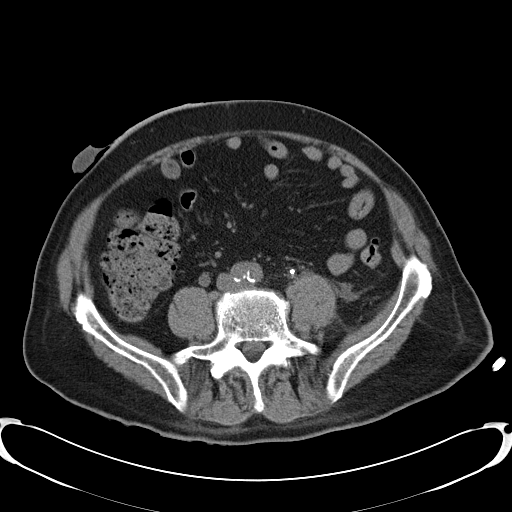
[im 2/45  bone]
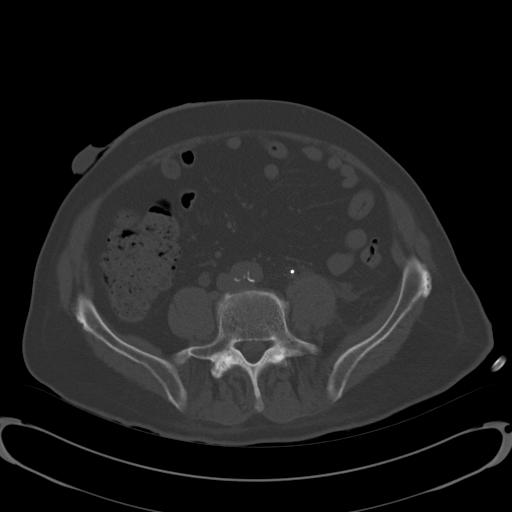
[im 6/45  soft-tissue]
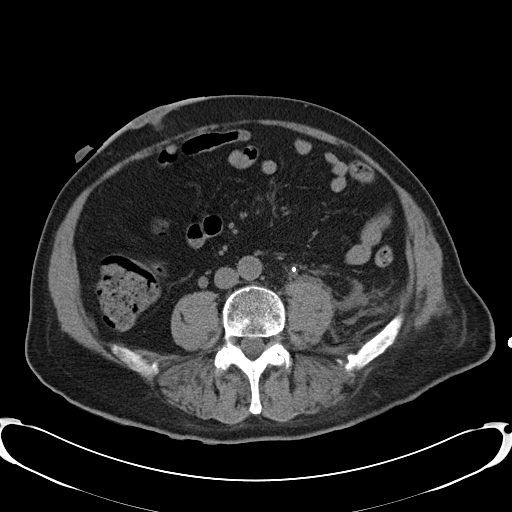
[im 10/45  soft-tissue]
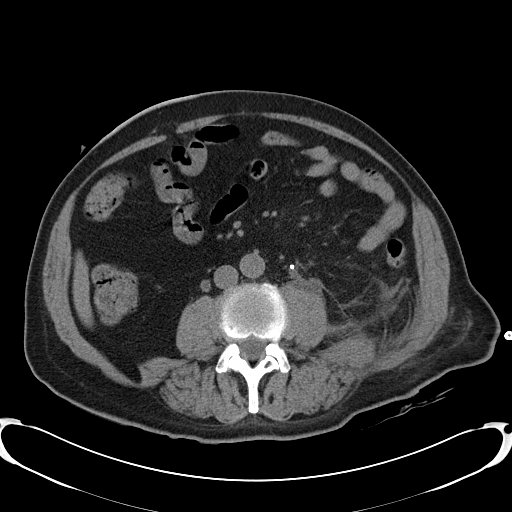
[im 12/45  soft-tissue]
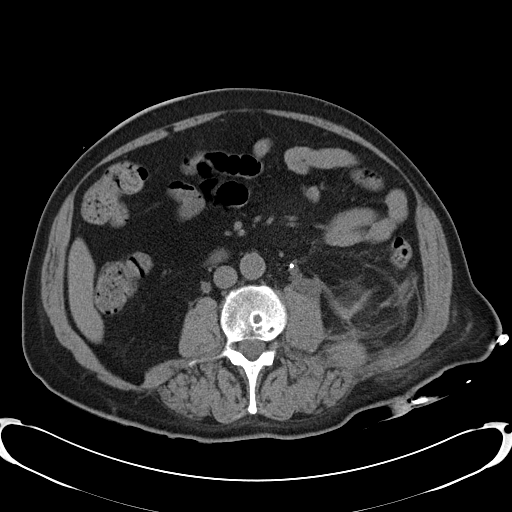
[im 16/45  soft-tissue]
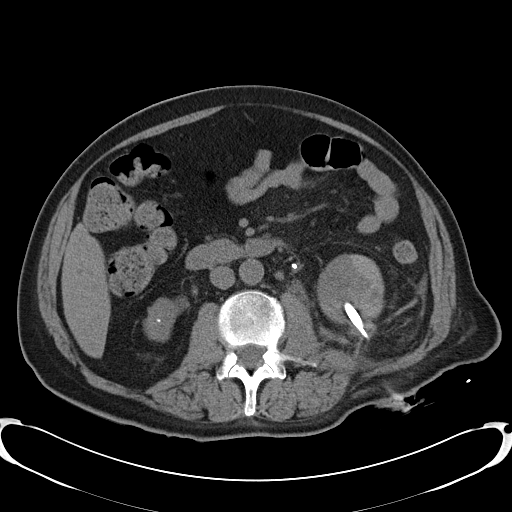
[im 20/45  soft-tissue]
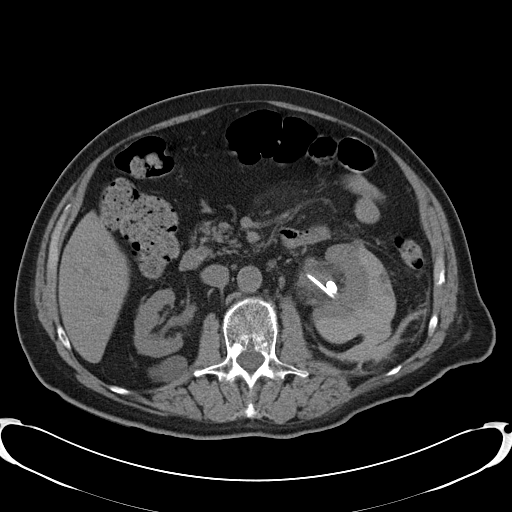
[im 23/45  soft-tissue]
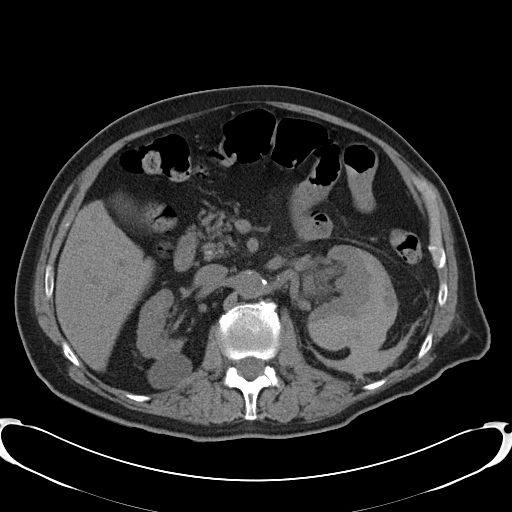
[im 25/45  soft-tissue]
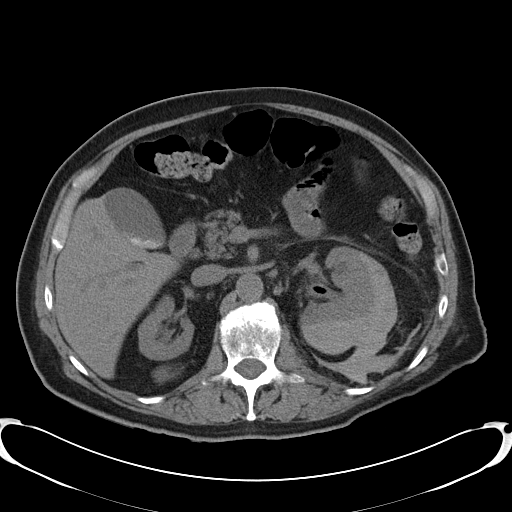
[im 29/45  soft-tissue]
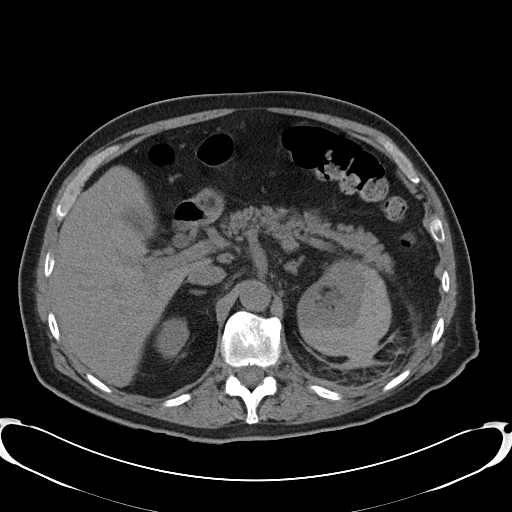
[im 29/45  bone]
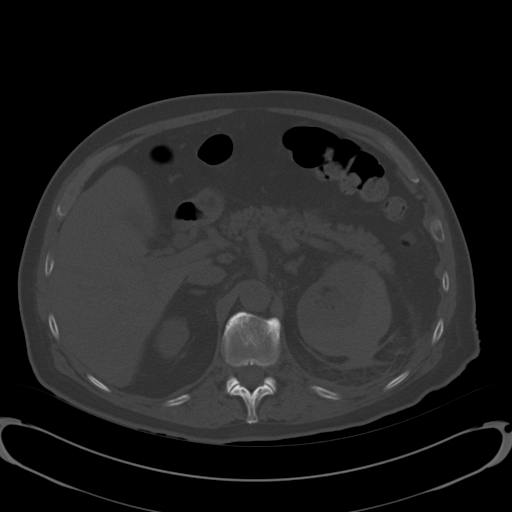
[im 33/45  soft-tissue]
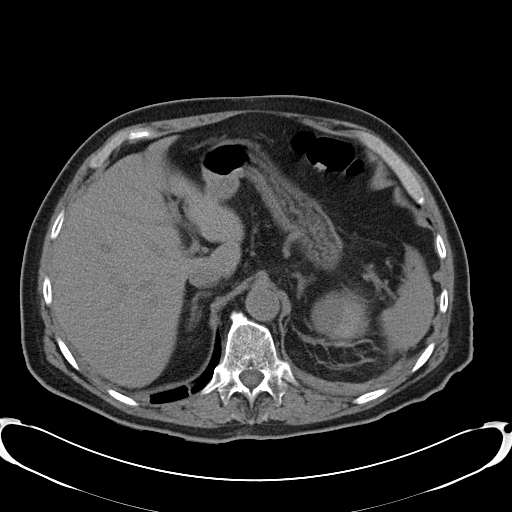
[im 35/45  soft-tissue]
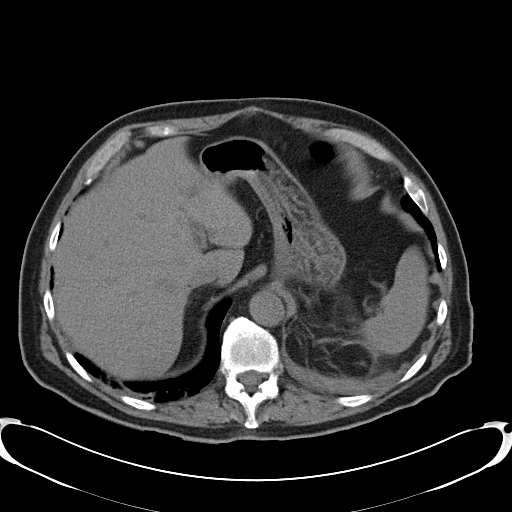
[im 39/45  soft-tissue]
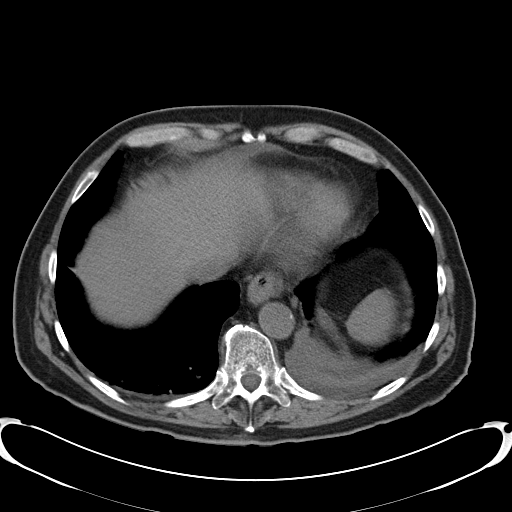
[im 43/45  soft-tissue]
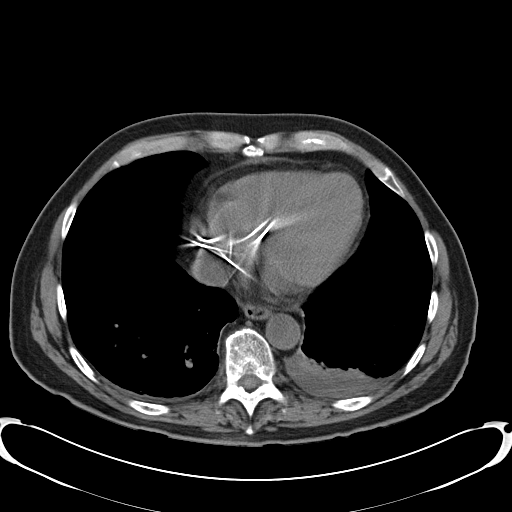

[Series 602: <mpr thick range> · coronal · 0.76mm/px · 3 of 89 slices shown]
[im 30/89  soft-tissue]
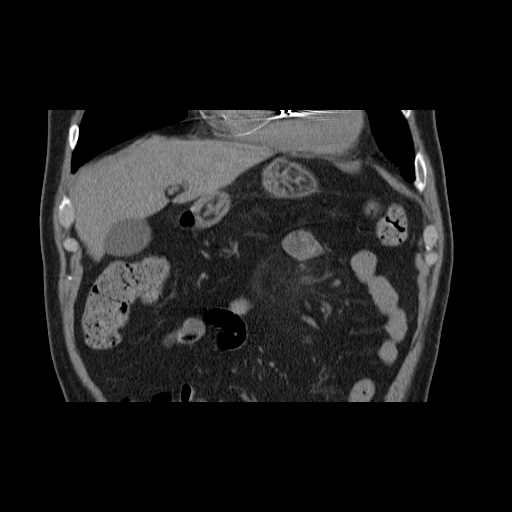
[im 40/89  soft-tissue]
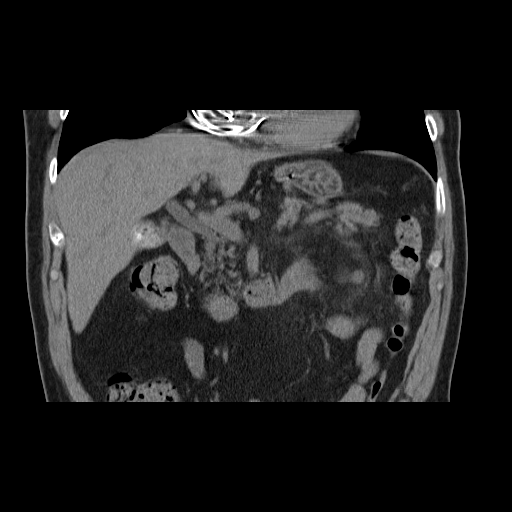
[im 49/89  soft-tissue]
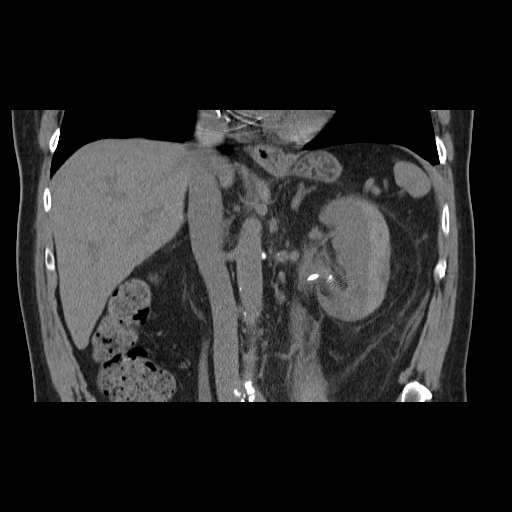

[16 of 46 positions shown; findings below may reference images not displayed]

FINDINGS: Small left pleural effusion with associated left lower
lobe atelectasis.

Pacemaker leads, incompletely visualized.

Unenhanced liver, pancreas, adrenal glands are within normal
limits.

Spleen is notable for a calcified splenic granuloma.

Cholelithiasis, without associated inflammatory changes.  No
intrahepatic or extrahepatic ductal dilatation.

Status post percutaneous left nephrostomy.  Large bore nephrostomy
catheter and additional small bore nephroureteral catheter. Small
amount of gas within the nondilated collecting system (series
2/image 21).  No residual calculi are present.  Mild periureteral
stranding, nonspecific.

Moderate subcapsular hematoma, measuring up to 2.4 cm in thickness
(series 2/image 19), with mild compression of the underlying renal
cortex.  Small amount of associated perinephric hemorrhage layering
posteriorly along the left posterior pararenal space (series
2/image 23).

Small right kidney.  2.4 x 3.8 cm posterior right upper pole cyst.
1.6 x 1.6 cm anterior right lower pole cyst.  3 mm nonobstructing
right lower pole calculus (series 2/image 30).  No hydronephrosis.

Right mid abdominal ostomy.

Atherosclerotic calcifications of the abdominal aorta and branch
vessels.

No abdominal ascites.

Small retroperitoneal lymph nodes which do not meet pathologic CT
size criteria.

Degenerative changes of the visualized thoracolumbar spine.
IMPRESSION: Status post percutaneous left nephrostomy.  Small focus of gas
within the collecting system.  No residual calculi are present.

Associated moderate subcapsular hematoma with mild compression of
the underlying renal parenchyma, as described above.

These results will be called to the ordering clinician or
representative by the Radiologist Assistant, and communication
documented in the PACS Dashboard.

## 2013-06-21 IMAGING — CT CT PELVIS W/O CM
1 series · 15 of 32 positions shown, 19 images · IV contrast (APPLIED)
Comparison: Abdominal pelvic CT 08/16/2011.  Images from left
percutaneous nephrostomy study.

CLINICAL DATA: Status post nephrolithotomy.  Evaluate for residual
calculus.  History of bladder and prostate cancer.  History of
renal failure.

CT PELVIS WITHOUT CONTRAST
TECHNIQUE: Multidetector CT imaging of the pelvis was performed
following the standard protocol without intravenous contrast.

[Series 2: rtn pelvis st · axial · 0.78mm/px · z∈[-492,-268]mm · 15 of 51 slices shown, 19 images]
[im 4/51  soft-tissue]
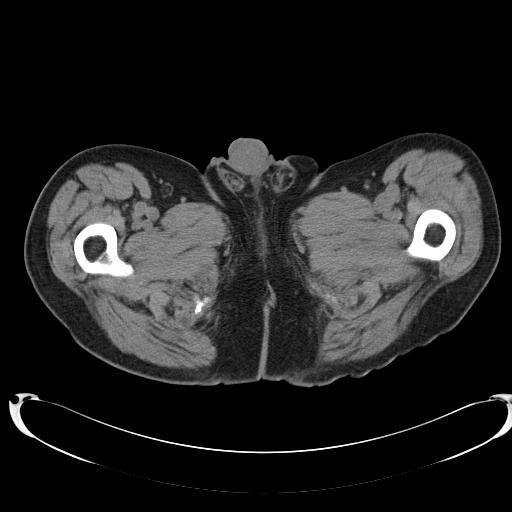
[im 4/51  bone]
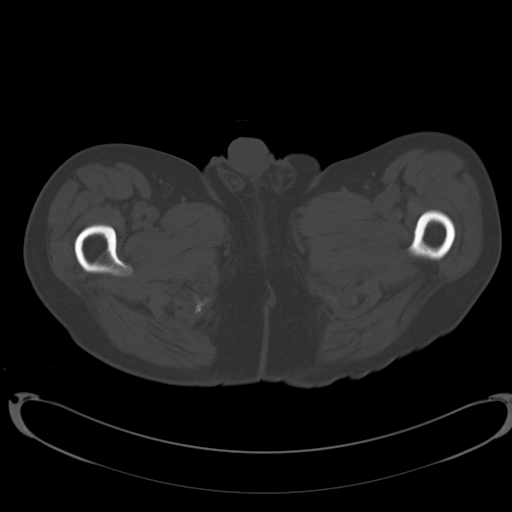
[im 7/51  soft-tissue]
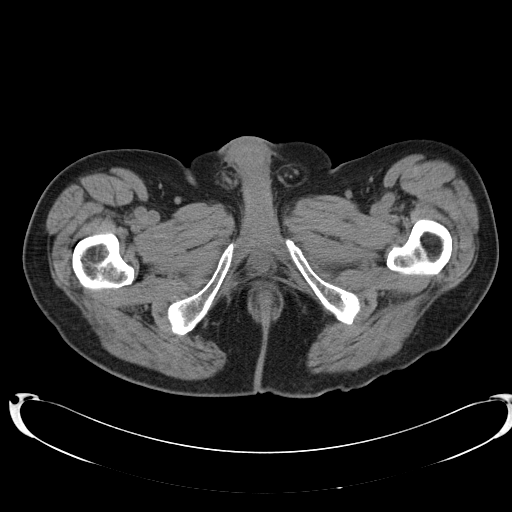
[im 10/51  soft-tissue]
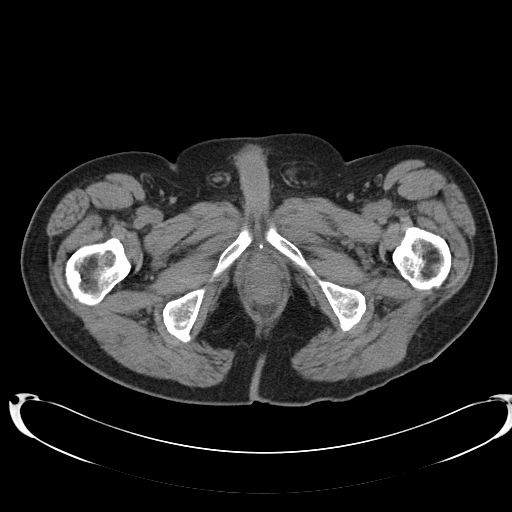
[im 15/51  soft-tissue]
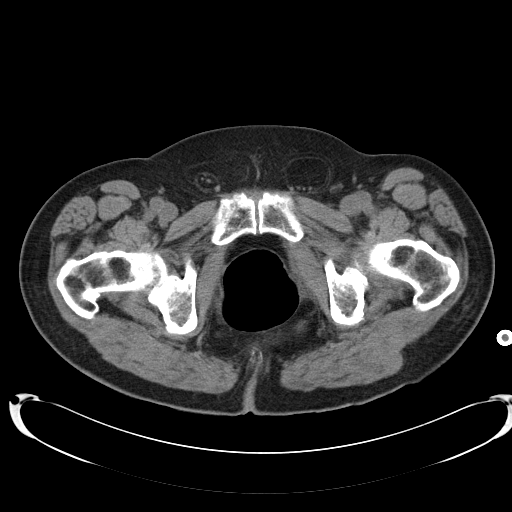
[im 18/51  soft-tissue]
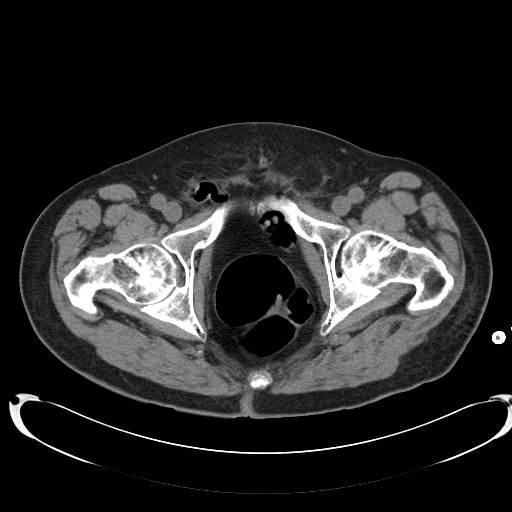
[im 21/51  soft-tissue]
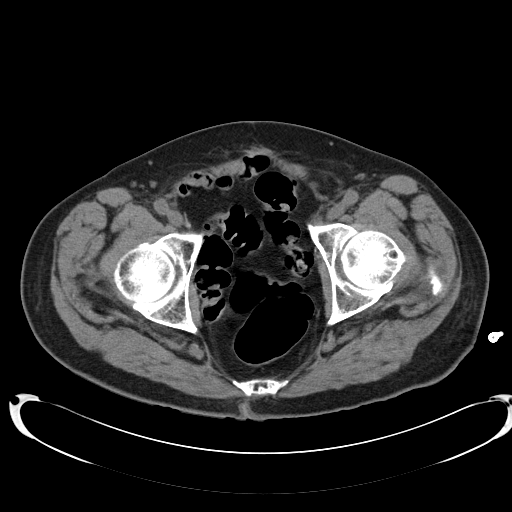
[im 26/51  soft-tissue]
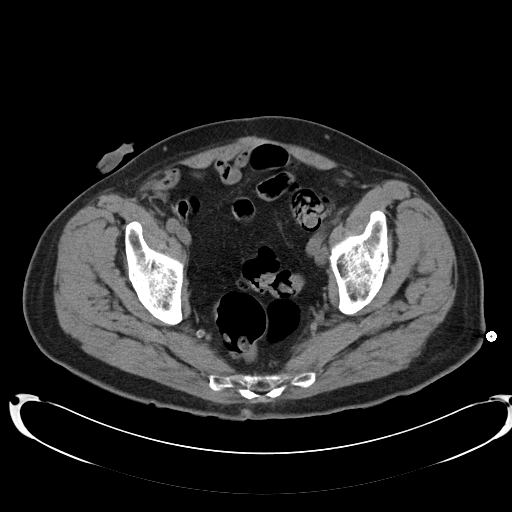
[im 30/51  soft-tissue]
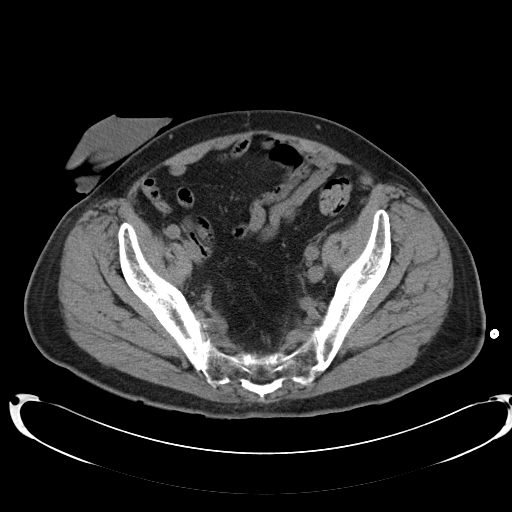
[im 33/51  soft-tissue]
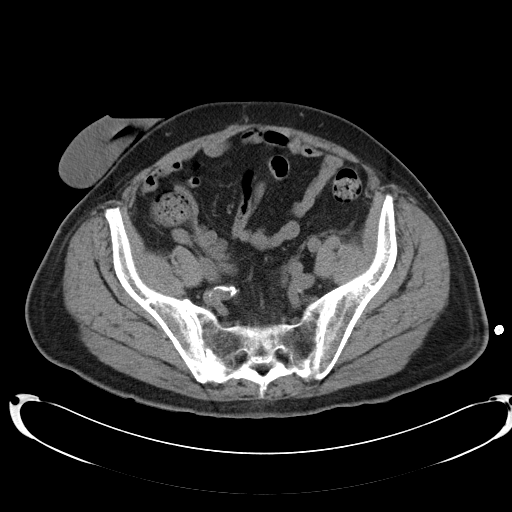
[im 33/51  bone]
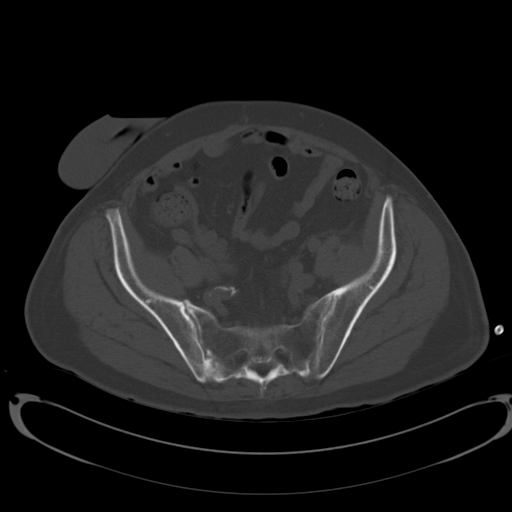
[im 36/51  soft-tissue]
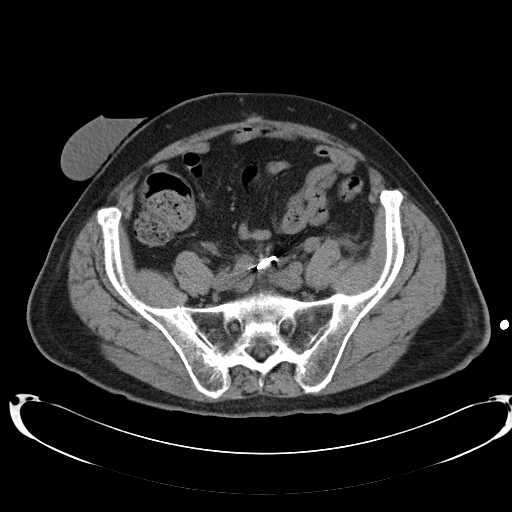
[im 41/51  soft-tissue]
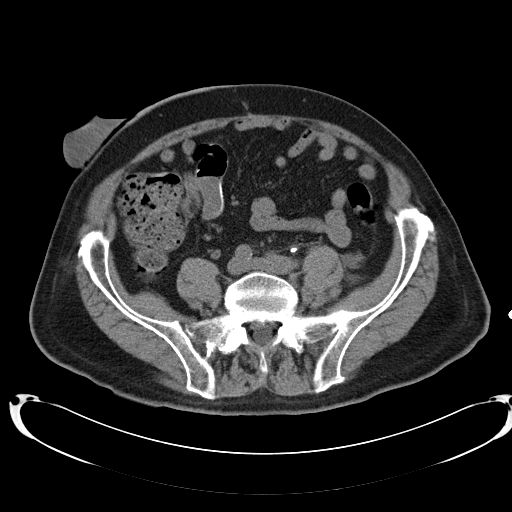
[im 44/51  soft-tissue]
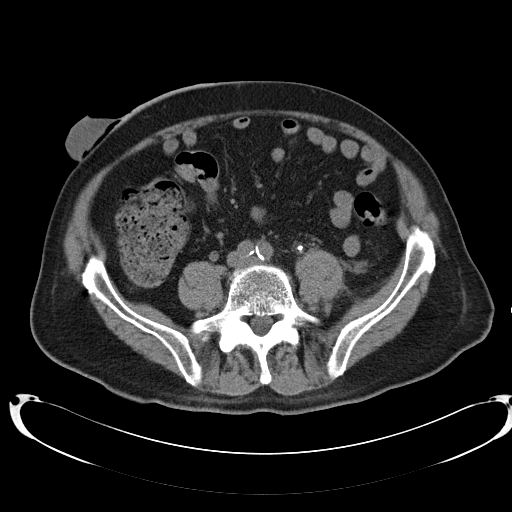
[im 44/51  lung]
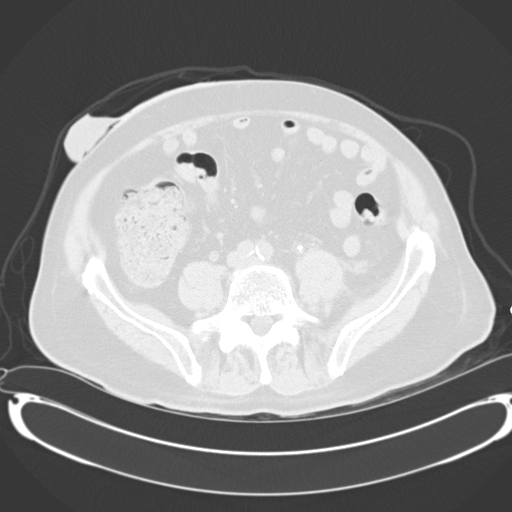
[im 46/51  lung]
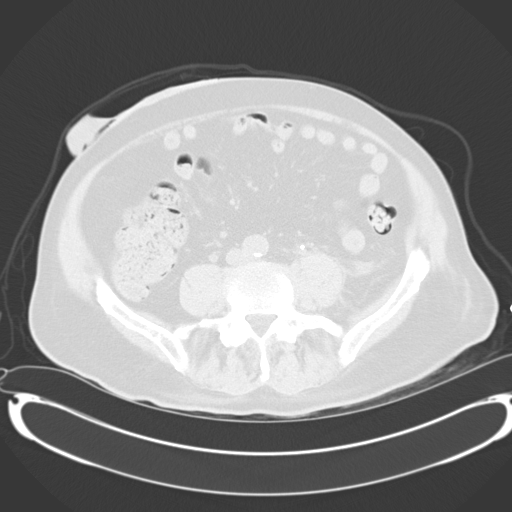
[im 47/51  soft-tissue]
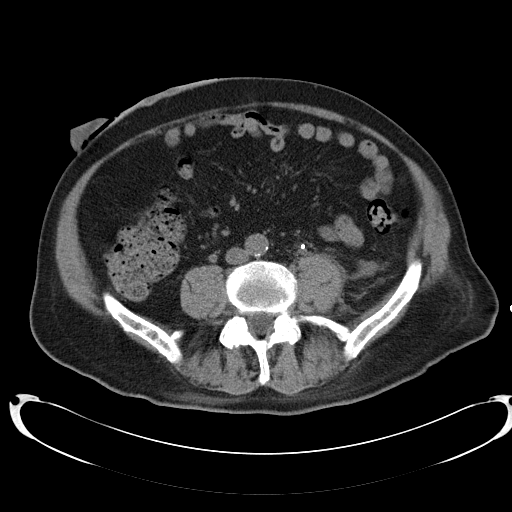
[im 47/51  lung]
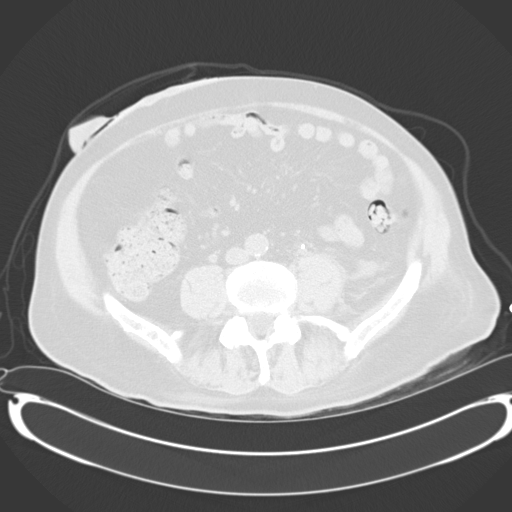
[im 49/51  lung]
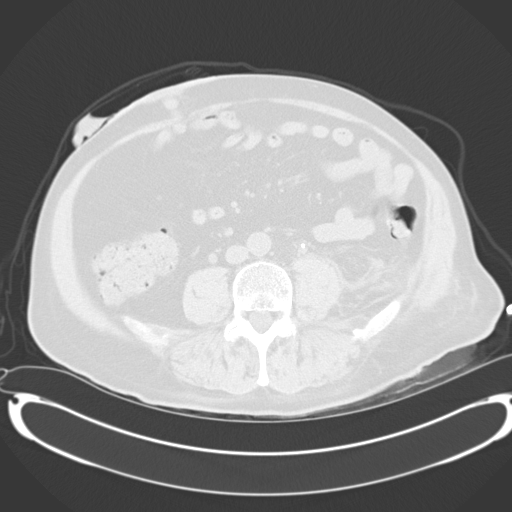

[15 of 32 positions shown; findings below may reference images not displayed]

FINDINGS: Left ureteral stent extends into the distal left ureter,
just proximal to the right lower quadrant ileal conduit.  There is
no residual left ureteral dilatation.  No calculi are seen within
the distal left ureter.  There is stable calcification or
postsurgical change along the posterior wall of the proximal ileal
conduit on image 18.  No definite calculi are seen within the
ileoconduit. The distal right ureter appears stable.

The kidneys and proximal ureters are not imaged on this
examination.

Mild residual soft tissue stranding is present in the inferior left
perinephric fat.  There is no focal fluid collection.  There are
stable vascular calcifications.  There is a stable left inguinal
hernia containing fat.  There is also a small right inguinal hernia
containing a knuckle of bowel.  There is no evidence of bowel
incarceration or obstruction.

There are no suspicious osseous findings.  Sclerotic lesion in the
proximal right femur is stable from 5222 consistent with a bone
island.
IMPRESSION: 1.  Interval left ureteral stent placement.  The distal aspect of
the left ureter is decompressed.  No calculi are seen within the
distal ureters or ileoconduit.  The kidneys and proximal ureters
are not imaged on this examination of the pelvis.
2.  Probable calcifications or postsurgical changes within the wall
of the ileoconduit are unchanged.
3.  Stable inguinal hernias.

## 2013-06-27 IMAGING — RF DG NEPHROSTOGRAM LEFT
11 series · 11 of 11 positions shown · non-contrast
Comparison: 09/03/2011 CT

INDICATION: Patient with left nephrostomy tube in place, evaluate
for ileal conduit patency prior to removal.
TECHNIQUE: A total of 50 ml Rmnipaque-DGG injected via the
patient's nephrostomy tube and Nephro ureteral safety catheter.

Total fluoroscopy time:  2 minutes

[Series 1: run · 1 of 1 slices shown (1 of 10)]
[im 1/1]
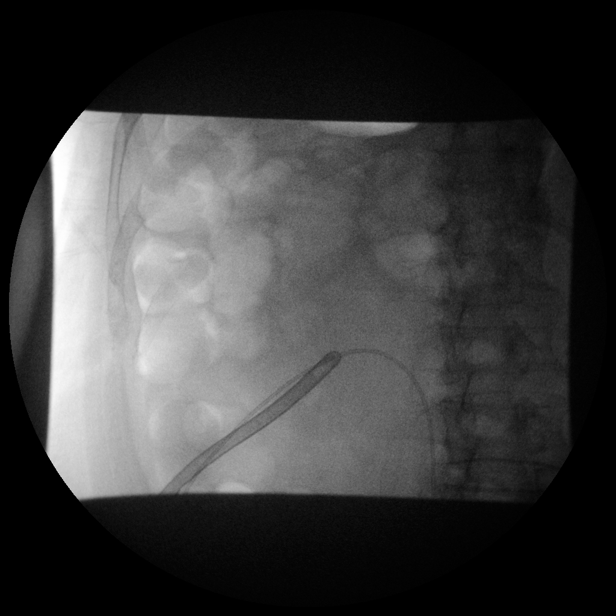

[Series 1: run · 1 of 1 slices shown (2 of 10)]
[im 1/1]
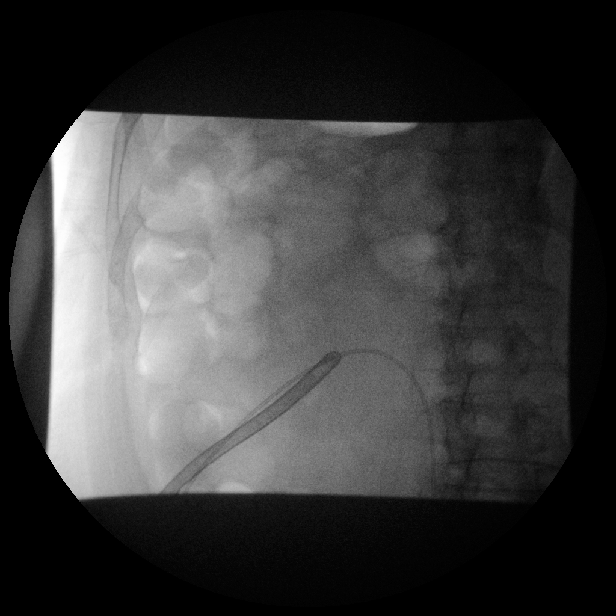

[Series 2: run · 1 of 1 slices shown (3 of 10)]
[im 1/1]
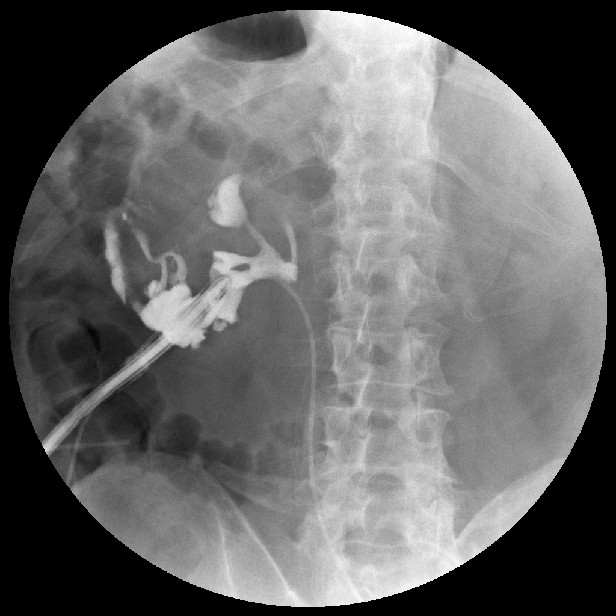

[Series 2: run · 1 of 1 slices shown (4 of 10)]
[im 1/1]
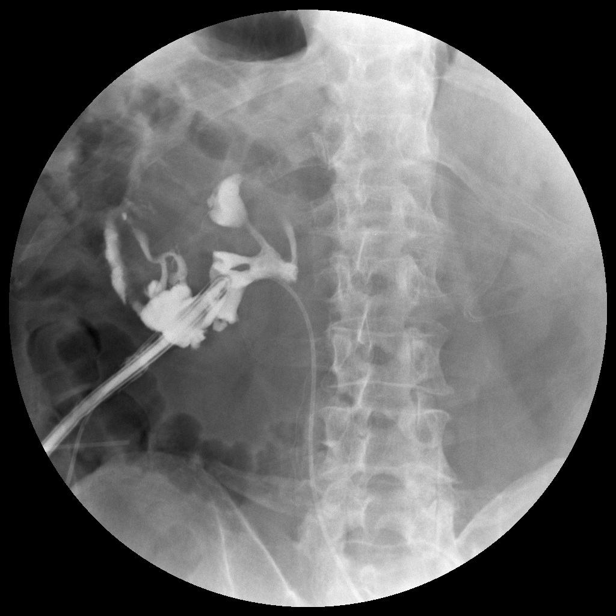

[Series 3: run · 1 of 1 slices shown (5 of 10)]
[im 1/1]
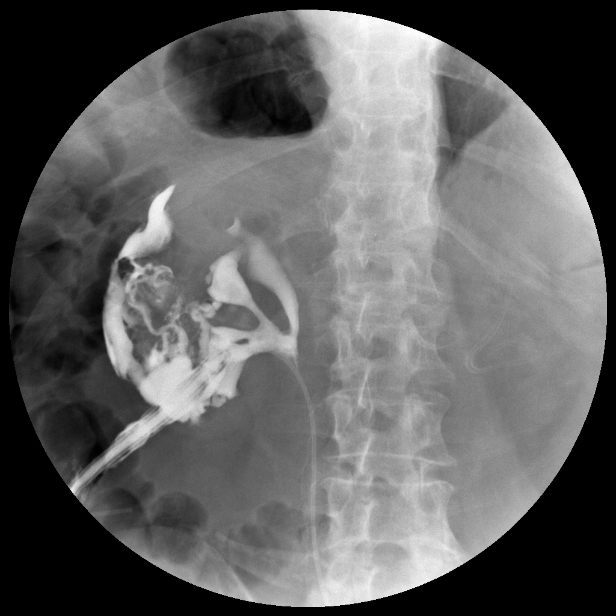

[Series 3: run · 1 of 1 slices shown (6 of 10)]
[im 1/1]
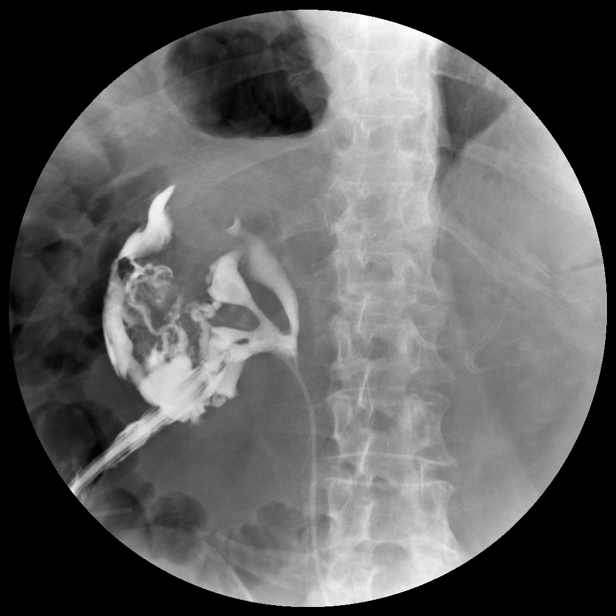

[Series 4: run · 1 of 1 slices shown (7 of 10)]
[im 1/1]
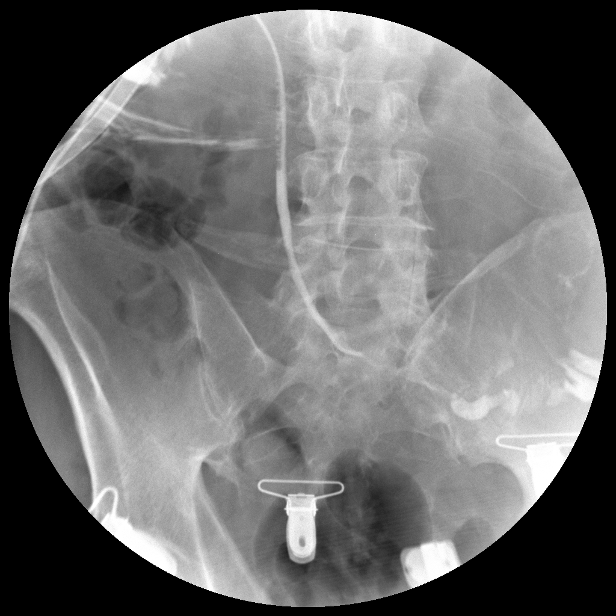

[Series 4: run · 1 of 1 slices shown (8 of 10)]
[im 1/1]
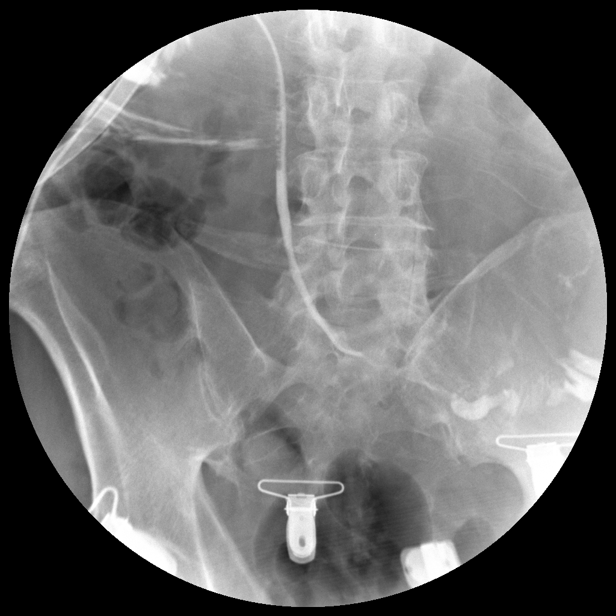

[Series 5: run · 1 of 1 slices shown (9 of 10)]
[im 1/1]
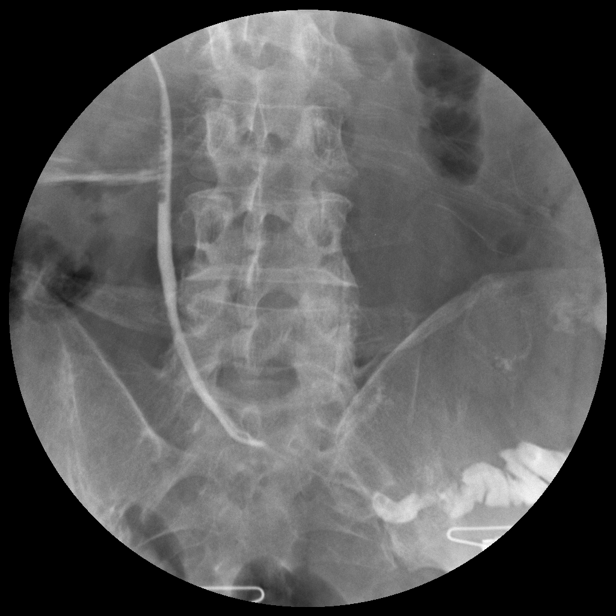

[Series 5: run · 1 of 1 slices shown (10 of 10)]
[im 1/1]
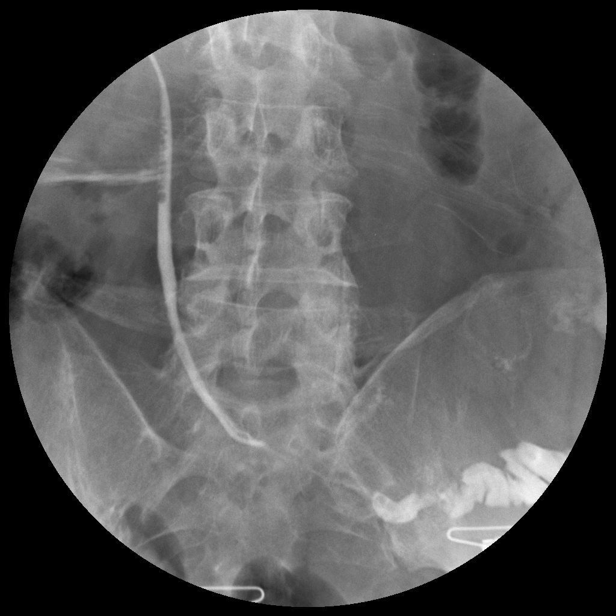

[Series 1001: view not recorded · 0.20mm/px · 1 of 1 slices shown]
[im 1/1]
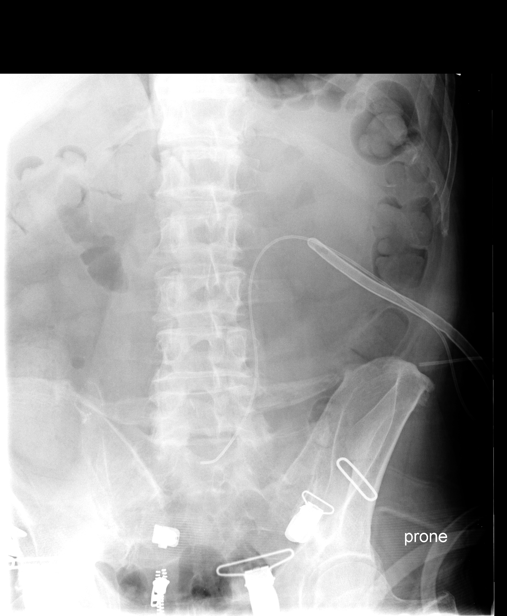

[11 of 11 positions shown; findings below may reference images not displayed]

FINDINGS: Contrast initially administered through the left
nephrostomy tube demonstrates opacification of the nondilated renal
pelvis however also demonstrates extravasation of contrast.  When
correlated with the recent CT, this corresponds to contrast within
the subcapsular and left perinephric/posterior pararenal spaces.
Presumably due to preferential emptying, contrast fails to pass
into the ureter.

Therefore, the patient's Nephro ureteral safety catheter was
utilized.  Contrast via this catheter opacifies a nondilated distal
ureter and readily passes the anastomoses into the ileal conduit.
No extravasation visualized.  There are multiple filling defects
within the proximal/mid ureter, favored to reflect air bubbles
introduced at the time of injection.

Nonobstructive bowel gas pattern.  No acute osseous finding.
IMPRESSION: 1. Left percutaneous nephrostomy tube opacifies the nondilated
renal pelvis however preferentially empties into the subcapsular,
perinephric, and posterior pararenal space.

2. Via the safety catheter, patent ileal conduit urostomy.  No
extravasation of injected contrast at the anastamosis.

Discussed via telephone with Dr. Anckar at [DATE] p.m. on 09/09/2011.

## 2013-07-14 ENCOUNTER — Inpatient Hospital Stay (HOSPITAL_COMMUNITY)
Admission: AD | Admit: 2013-07-14 | Discharge: 2013-07-16 | DRG: 660 | Disposition: A | Discharge status: Home / Self Care | Payer: Medicare Other | Source: Ambulatory Visit | Attending: Urology | Admitting: Urology

## 2013-07-14 ENCOUNTER — Encounter (HOSPITAL_COMMUNITY): Payer: Self-pay | Admitting: *Deleted

## 2013-07-14 ENCOUNTER — Inpatient Hospital Stay (HOSPITAL_COMMUNITY): Payer: Medicare Other

## 2013-07-14 DIAGNOSIS — N200 Calculus of kidney: Secondary | ICD-10-CM | POA: Diagnosis present

## 2013-07-14 DIAGNOSIS — Z936 Other artificial openings of urinary tract status: Secondary | ICD-10-CM

## 2013-07-14 DIAGNOSIS — N139 Obstructive and reflux uropathy, unspecified: Secondary | ICD-10-CM

## 2013-07-14 DIAGNOSIS — N179 Acute kidney failure, unspecified: Secondary | ICD-10-CM

## 2013-07-14 DIAGNOSIS — N2889 Other specified disorders of kidney and ureter: Secondary | ICD-10-CM | POA: Diagnosis present

## 2013-07-14 DIAGNOSIS — K8020 Calculus of gallbladder without cholecystitis without obstruction: Secondary | ICD-10-CM | POA: Diagnosis present

## 2013-07-14 DIAGNOSIS — M810 Age-related osteoporosis without current pathological fracture: Secondary | ICD-10-CM | POA: Diagnosis present

## 2013-07-14 DIAGNOSIS — E875 Hyperkalemia: Secondary | ICD-10-CM | POA: Diagnosis present

## 2013-07-14 DIAGNOSIS — N390 Urinary tract infection, site not specified: Secondary | ICD-10-CM | POA: Diagnosis present

## 2013-07-14 DIAGNOSIS — Z8546 Personal history of malignant neoplasm of prostate: Secondary | ICD-10-CM

## 2013-07-14 DIAGNOSIS — F3289 Other specified depressive episodes: Secondary | ICD-10-CM | POA: Diagnosis present

## 2013-07-14 DIAGNOSIS — F329 Major depressive disorder, single episode, unspecified: Secondary | ICD-10-CM | POA: Diagnosis present

## 2013-07-14 DIAGNOSIS — Q619 Cystic kidney disease, unspecified: Secondary | ICD-10-CM

## 2013-07-14 DIAGNOSIS — Z8551 Personal history of malignant neoplasm of bladder: Secondary | ICD-10-CM

## 2013-07-14 DIAGNOSIS — N189 Chronic kidney disease, unspecified: Secondary | ICD-10-CM | POA: Diagnosis present

## 2013-07-14 DIAGNOSIS — N1330 Unspecified hydronephrosis: Secondary | ICD-10-CM | POA: Diagnosis present

## 2013-07-14 DIAGNOSIS — M109 Gout, unspecified: Secondary | ICD-10-CM | POA: Diagnosis present

## 2013-07-14 DIAGNOSIS — Z87891 Personal history of nicotine dependence: Secondary | ICD-10-CM

## 2013-07-14 DIAGNOSIS — K4090 Unilateral inguinal hernia, without obstruction or gangrene, not specified as recurrent: Secondary | ICD-10-CM | POA: Diagnosis present

## 2013-07-14 DIAGNOSIS — I129 Hypertensive chronic kidney disease with stage 1 through stage 4 chronic kidney disease, or unspecified chronic kidney disease: Secondary | ICD-10-CM | POA: Diagnosis present

## 2013-07-14 DIAGNOSIS — Z950 Presence of cardiac pacemaker: Secondary | ICD-10-CM

## 2013-07-14 DIAGNOSIS — I359 Nonrheumatic aortic valve disorder, unspecified: Secondary | ICD-10-CM | POA: Diagnosis present

## 2013-07-14 DIAGNOSIS — Z86718 Personal history of other venous thrombosis and embolism: Secondary | ICD-10-CM

## 2013-07-14 DIAGNOSIS — R011 Cardiac murmur, unspecified: Secondary | ICD-10-CM | POA: Diagnosis present

## 2013-07-14 LAB — COMPREHENSIVE METABOLIC PANEL
ALBUMIN: 3.4 g/dL — AB (ref 3.5–5.2)
ALK PHOS: 63 U/L (ref 39–117)
ALT: 8 U/L (ref 0–53)
AST: 17 U/L (ref 0–37)
BILIRUBIN TOTAL: 0.3 mg/dL (ref 0.3–1.2)
BUN: 45 mg/dL — AB (ref 6–23)
CHLORIDE: 102 meq/L (ref 96–112)
CO2: 26 mEq/L (ref 19–32)
Calcium: 9.4 mg/dL (ref 8.4–10.5)
Creatinine, Ser: 2.49 mg/dL — ABNORMAL HIGH (ref 0.50–1.35)
GFR calc Af Amer: 25 mL/min — ABNORMAL LOW (ref >90)
GFR calc non Af Amer: 21 mL/min — ABNORMAL LOW (ref >90)
GLUCOSE: 100 mg/dL — AB (ref 70–99)
POTASSIUM: 6.2 meq/L — AB (ref 3.7–5.3)
SODIUM: 140 meq/L (ref 137–147)
Total Protein: 7.4 g/dL (ref 6.0–8.3)

## 2013-07-14 LAB — CBC
HEMATOCRIT: 36.3 % — AB (ref 39.0–52.0)
HEMOGLOBIN: 12 g/dL — AB (ref 13.0–17.0)
MCH: 31.3 pg (ref 26.0–34.0)
MCHC: 33.1 g/dL (ref 30.0–36.0)
MCV: 94.8 fL (ref 78.0–100.0)
Platelets: 219 10*3/uL (ref 150–400)
RBC: 3.83 MIL/uL — ABNORMAL LOW (ref 4.22–5.81)
RDW: 13.4 % (ref 11.5–15.5)
WBC: 14.7 10*3/uL — ABNORMAL HIGH (ref 4.0–10.5)

## 2013-07-14 LAB — APTT: aPTT: 32 seconds (ref 24–37)

## 2013-07-14 LAB — PROTIME-INR
INR: 1.08 (ref 0.00–1.49)
Prothrombin Time: 13.8 seconds (ref 11.6–15.2)

## 2013-07-14 MED ORDER — FENTANYL CITRATE 0.05 MG/ML IJ SOLN
INTRAMUSCULAR | Status: AC | PRN
  Administered 2013-07-14: 50 ug via INTRAVENOUS

## 2013-07-14 MED ORDER — POTASSIUM CHLORIDE IN NACL 20-0.45 MEQ/L-% IV SOLN
INTRAVENOUS | Status: DC
  Administered 2013-07-14: 22:00:00 via INTRAVENOUS
  Filled 2013-07-14 (×2): qty 1000

## 2013-07-14 MED ORDER — CEFAZOLIN SODIUM-DEXTROSE 2-3 GM-% IV SOLR
2.0000 g | INTRAVENOUS | Status: AC
Start: 2013-07-14 — End: 2013-07-14
  Administered 2013-07-14: 2 g via INTRAVENOUS
  Filled 2013-07-14: qty 50

## 2013-07-14 MED ORDER — IOHEXOL 300 MG/ML  SOLN: 10.0000 mL | Freq: Once | INTRAMUSCULAR | Status: AC | PRN

## 2013-07-14 MED ORDER — DIPHENHYDRAMINE HCL 12.5 MG/5ML PO ELIX: 12.5000 mg | ORAL_SOLUTION | Freq: Four times a day (QID) | ORAL | Status: DC | PRN

## 2013-07-14 MED ORDER — HYDROCODONE-ACETAMINOPHEN 5-325 MG PO TABS
1.0000 | ORAL_TABLET | ORAL | Status: DC | PRN
  Administered 2013-07-16: 2 via ORAL
  Filled 2013-07-14: qty 2

## 2013-07-14 MED ORDER — ONDANSETRON HCL 4 MG/2ML IJ SOLN: 4.0000 mg | INTRAMUSCULAR | Status: DC | PRN

## 2013-07-14 MED ORDER — HYDROMORPHONE HCL PF 1 MG/ML IJ SOLN: 0.5000 mg | INTRAMUSCULAR | Status: DC | PRN

## 2013-07-14 MED ORDER — DEXTROSE 5 % IV SOLN
1.0000 g | INTRAVENOUS | Status: DC
  Administered 2013-07-15: 1 g via INTRAVENOUS
  Filled 2013-07-14 (×3): qty 10

## 2013-07-14 MED ORDER — FENTANYL CITRATE 0.05 MG/ML IJ SOLN
INTRAMUSCULAR | Status: AC
  Filled 2013-07-14: qty 4

## 2013-07-14 MED ORDER — SODIUM CHLORIDE 0.9 % IV SOLN: INTRAVENOUS | Status: DC

## 2013-07-14 MED ORDER — DOCUSATE SODIUM 100 MG PO CAPS
100.0000 mg | ORAL_CAPSULE | Freq: Two times a day (BID) | ORAL | Status: DC
  Administered 2013-07-14 – 2013-07-16 (×4): 100 mg via ORAL
  Filled 2013-07-14 (×5): qty 1

## 2013-07-14 MED ORDER — AMLODIPINE BESYLATE 2.5 MG PO TABS
2.5000 mg | ORAL_TABLET | Freq: Every day | ORAL | Status: DC
  Administered 2013-07-14 – 2013-07-16 (×3): 2.5 mg via ORAL
  Filled 2013-07-14 (×3): qty 1

## 2013-07-14 MED ORDER — ZOLPIDEM TARTRATE 5 MG PO TABS: 5.0000 mg | ORAL_TABLET | Freq: Every evening | ORAL | Status: DC | PRN

## 2013-07-14 MED ORDER — MIDAZOLAM HCL 2 MG/2ML IJ SOLN
INTRAMUSCULAR | Status: AC
  Filled 2013-07-14: qty 4

## 2013-07-14 MED ORDER — DIPHENHYDRAMINE HCL 50 MG/ML IJ SOLN: 12.5000 mg | Freq: Four times a day (QID) | INTRAMUSCULAR | Status: DC | PRN

## 2013-07-14 MED ORDER — LIDOCAINE HCL 1 % IJ SOLN
INTRAMUSCULAR | Status: AC
  Filled 2013-07-14: qty 20

## 2013-07-14 MED ORDER — MIDAZOLAM HCL 2 MG/2ML IJ SOLN
INTRAMUSCULAR | Status: AC | PRN
  Administered 2013-07-14: 1 mg via INTRAVENOUS

## 2013-07-14 MED ORDER — ACETAMINOPHEN 325 MG PO TABS: 650.0000 mg | ORAL_TABLET | ORAL | Status: DC | PRN

## 2013-07-14 NOTE — Sedation Documentation (Signed)
MD at bedside. 

## 2013-07-14 NOTE — Sedation Documentation (Signed)
Vital signs stable. 

## 2013-07-14 NOTE — Procedures (Signed)
Interventional Radiology Procedure Note  Procedure: Placement of right 27F perc nephrostomy tube.   Urine sent for cx. Complications: None Recommendations: - Bag drainage - Follow Cr - Urine cx & sensitivity pending - Antegrade nephroureterogram in approx 1 week to assess for cause of hydro, attempt internalization   Signed,  Criselda Peaches, MD [facility]

## 2013-07-14 NOTE — H&P (Signed)
tive Problems Problems   1. Chronic renal insufficiency (585.9)  2. 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 Erectile dysfunction due to arterial insufficiency (607.84)  4. Hematoma Of The Left Kidney (866.01)  5. Hydronephrosis, right (591)  6. Hypertension (401.9)  7. Kidney stone on left side (592.0)  8. Kidney stone on right side (592.0)  9. Personal history of bladder cancer (V10.51)  10. Phimosis (605)  History of Present Illness  Mr. Illes returns today in f/u.  He had a left PCNL on 99991111 that was complicated by postop bleeding and renal insufficiency.  A renal US was done today shows a decrease in size of the left renal residual  hematoma but stable renal cysts.  There are small stones in the lower pole of both kidneys. There is mild hydronephrosis on the right but none on the left. His stoma from his ileal conduit is doing well.  He has had no hematuria but he has some pain in the right flank.   he has had no fever but he has had nausea.   He has no associated signs or symptoms.   Past Medical History Problems   1. History of Acute renal insufficiency (593.9)  2. History of Gout (274.9)  3. History of hypertension (V12.59)  4. History of Murmur (785.2)  5. Personal history of bladder cancer (V10.51)  6. History of Urinary Tract Infection (V13.02)  7. History of Venous Thrombosis (V12.51)  Surgical History Problems   1. History of Appendectomy  2. History of Complete Cystectomy, Ureteroileal Conduit & Lymphadenectomy  3. History of Pacemaker Placement (V53.31)  4. History of Percutaneous Lithotomy  5. History of Percutaneous Lithotomy For Stone Over 2cm.  6. History of Prostate Surgery  7. History of Prostate Surgery  Current Meds  1. AmLODIPine Besylate 2.5 MG Oral Tablet;  Therapy: [id number] to Recorded  2. Aspirin 81 MG Oral Tablet;  Therapy: (Recorded:28Feb2011) to Recorded  3. Lutein 20 MG Oral Capsule;  Therapy: (Recorded:17Apr2014) to Recorded  4. Stool  Softener TABS;  Therapy: (Recorded:17Apr2014) to Recorded  5. Vitamin C TABS;  Therapy: (Recorded:17Apr2014) to Recorded  6. Vitamin D3 1000 UNIT Oral Capsule;  Therapy: (Recorded:28Feb2011) to Recorded  7. Vitamin E 400 UNIT Oral Capsule;  Therapy: (Recorded:17Apr2014) to Recorded  Allergies Medication   1. Pravastatin Sodium TABS  Family History Problems   1. Family history of Blood Disorders (V18.3) : Sister  2. Family history of Pancreatic Disorders : Father  3. Family history of Pancreatic Disorders : Mother  Social History Problems   1. Denied: History of Alcohol Use  2. Caffeine Use  3. Former smoker (V15.82)  4. Marital History - Widowed  5. Occupation:   [employer]  6. History of Tobacco Use (V15.82)   SMOKED AND CHEWED TOBACCO BUT QUIT 50 YRS AGO  Review of Systems Genitourinary, constitutional, skin, eye, otolaryngeal, hematologic/lymphatic, cardiovascular, pulmonary, endocrine, musculoskeletal, gastrointestinal, neurological and psychiatric system(s) were reviewed and pertinent findings if present are noted.  Genitourinary: no hematuria.  Gastrointestinal: nausea and flank pain.  Constitutional: no fever.  Cardiovascular: no chest pain.  Respiratory: no shortness of breath.    Vitals Vital Signs [Data Includes: Last 1 Day]  Recorded: 22Apr2015 03:40PM  Temperature: 99 F Recorded: 22Apr2015 02:39PM  Weight: 168 lb  BMI Calculated: 25.17 BSA Calculated: 1.91 Blood Pressure: 139 / 83 Heart Rate: 86  Physical Exam Constitutional: Well nourished and well developed . No acute distress.  Pulmonary: No respiratory distress and normal  respiratory rhythm and effort.  Cardiovascular: Heart rate and rhythm are normal . No peripheral edema.  Abdomen: Moderate tenderness in the RLQ is present. moderate right CVA tenderness.    Results/Data Urine [Data Includes: Last 1 Day]   22Apr2015  COLOR YELLOW   APPEARANCE TURBID    SPECIFIC GRAVITY 1.015   pH 7.5   GLUCOSE NEG mg/dL  BILIRUBIN NEG   KETONE NEG mg/dL  BLOOD LARGE   PROTEIN 100 mg/dL  UROBILINOGEN 0.2 mg/dL  NITRITE NEG   LEUKOCYTE ESTERASE LARGE   SQUAMOUS EPITHELIAL/HPF RARE   WBC 21-50 WBC/hpf  RBC 3-6 RBC/hpf  BACTERIA MANY   CRYSTALS NONE SEEN   CASTS NONE SEEN    The following images/tracing/specimen were independently visualized:  Renal US today shows new right hydronephrosis. There are bilateral lower pole stones. There is no left hydro. See study sheet for details. CT reviewed and noted below.  The following clinical lab reports were reviewed:  UA reviewed. Selected Results  AU CT-STONE PROTOCOL 22Apr2015 12:00AM Irine Seal   Test Name Result Flag Reference  CT-STONE PROTOCOL (Report)    ** RADIOLOGY REPORT BY [facility] **   CLINICAL DATA: Microhematuria, right back pain. History of bladder cancer with cystectomy and prostatectomy. Ileal conduit.  EXAM: CT ABDOMEN AND PELVIS WITHOUT CONTRAST (URINARY CALCULUS PROTOCOL)  TECHNIQUE: Multidetector CT imaging was performed through the abdomen and pelvis without intravenous contrast to include the urinary tract.  COMPARISON: 1. Bilateral hydronephrosis, right greater than left. Questionable stone debris in the lower right ureter, near the junction with the ileal conduit as the cause of patient's moderate right hydronephrosis. Please correlate clinically.  FINDINGS: Liver is unremarkable. Numerous stones are seen in the gallbladder. Adrenal glands are normal. There are stones in the lower pole right kidney. Moderate right hydronephrosis with perinephric and periureteric stranding. There are calcifications in the region of the lower right ureter (image 46), near the junction with the ileal conduit. No calcifications are seen within the ileal conduit. Stones are seen in the left kidney with perinephric stranding. Haziness about the left renal pelvis may be  chronic. Mild dilatation of the left ureter without ureteral stone. There are low-attenuation lesions in the kidneys measuring up to 10 mm, difficult to definitively characterize without post-contrast imaging. Spleen, pancreas, stomach and small bowel are otherwise unremarkable. A fair amount of stool in the colon is indicative of constipation.  Postoperative changes of cystectomy and prostatectomy. Ileal conduit exits the right lower quadrant. Small left inguinal hernia contains fat. Right inguinal hernia contains unobstructed bowel. Anterior midline pelvic wall ventral hernia contains unobstructed bowel. No pathologically enlarged lymph nodes. No free fluid. Atherosclerotic calcification of the arterial vasculature without abdominal aortic aneurysm. No worrisome lytic or sclerotic lesions. Probable scattered bone islands. Degenerative changes are seen in the spine.  IMPRESSION: 1. Bilateral hydronephrosis, right greater than left. Questionable stone debris in the lower right ureter, near the junction with the ileal conduit, as the cause of the patient's moderate right hydronephrosis and right back pain. Please correlate clinically. 2. Bilateral nephrolithiasis. 3. Cholelithiasis. 4. Fair amount of stool in the colon is indicative of constipation. 5. Ventral and inguinal hernias, as described above.   Electronically Signed  By: Lorin Picket M.D.  On: 07/14/2013 16:26   Assessment Assessed   1. Hydronephrosis, right (591)  2. Kidney stone on left side (592.0)  3. Kidney stone on right side (592.0)  4. 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 Bartsch has new  onset right hydro possible secondary to stone debris in the distal ureter at the conduit.    There is mild left hydro.  He has bilateral renal stones. He has a low grade fever and malaise.   Plan  Health Maintenance   1. UA With REFLEX; [Do Not Release]; Status:Resulted - Requires Verification;   Done:  22Apr2015  01:18PM Kidney stone on right side   2. AU CT-STONE PROTOCOL; Status:Resulted - Requires Verification;   Done: 22Apr2015  12:00AM   I am going to admit him to the hospital for IV antibiotics and a right perc tube.    He will need further investigation of the source of obstruction once his fever is controlled.     URINE CULTURE; Status:In Progress - Specimen/Data Collected;   Done: 22Apr2015 Perform:[facility]; Due:24Apr2015; Marked Important; Last Updated [id number], Kirtland Bouchard; 07/14/2013 3:55:17 PM;Ordered; Today;   [id number], right, Kidney stone on right side; Ordered [id number], Shyra Emile;   Discussion/Summary   CC: Dr. Crist Infante.

## 2013-07-14 NOTE — Progress Notes (Signed)
Patient ID: [name] [name], male   DOB: [birth year], 80 y.o.   MRN: [id number] Request received for right PCN in pt with hx of CKD, nephrolithiasis, bladder/prostate cancer with prior creation of ileal conduit; now with  rt flank pain, low grade fever, nausea, and imaging study today at [facility] revealing obstructive right hydronephrosis. Additional PMH as below. Dr. Laurence Ferrari has reviewed studies. Exam; pt awake/alert; chest- CTA bilat; pacer left chest wall; heart- RRR with murmur; abd- soft,+BS, tender rt lat abd/flank region; intact ileal conduit with yellow urine RLQ; ext- trace LE edema.   Filed Vitals:   07/14/13 1655  Height: 5\' 8"  (1.727 m)  Weight: 168 lb 11.2 oz (76.522 kg)   Past Medical History  Diagnosis Date  . Hypertension   . Aortic valve stenosis   . Gout   . Hx of bladder cancer   . Deep venous thrombosis     hx of  . Osteoporosis   . Renal failure   . Heart murmur   . Depression   . Chronic kidney disease   . Kidney stone   . Prostate cancer     AND BLADDER CANCER - S/P URETEROILEAL CONDUIT  . 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 Cristopher Peru -ELECTROPHYSIOLOGIST   Past Surgical History  Procedure Laterality Date  . Insert / replace / remove pacemaker    . Cystectomy    . Lymphadenectomy    . Prostate surgery    . Appendectomy    . Cataract extraction, bilateral    . Kidney stone surgery    . Ilial conduit      for bladder cancer  . Nephrolithotomy  09/02/2011    Procedure: NEPHROLITHOTOMY PERCUTANEOUS;  Surgeon: Malka So, MD;  Location: [facility];  Service: Urology;  Laterality: Left;   07/14/13 labs pending  A/P: Pt with hx of CKD, nephrolithiasis, bladder/prostate cancer with prior creation of ileal conduit; now with  rt flank pain, low grade fever, nausea, and imaging study today at [facility] revealing obstructive right hydronephrosis. Tent plan is for placement of a right PCN tonight. Details/risks of procedure d/w pt/daughter with their understanding and consent.

## 2013-07-15 DIAGNOSIS — N1330 Unspecified hydronephrosis: Secondary | ICD-10-CM | POA: Diagnosis present

## 2013-07-15 DIAGNOSIS — N390 Urinary tract infection, site not specified: Secondary | ICD-10-CM | POA: Diagnosis present

## 2013-07-15 DIAGNOSIS — E875 Hyperkalemia: Secondary | ICD-10-CM | POA: Diagnosis present

## 2013-07-15 LAB — URINE CULTURE: Colony Count: >=100000

## 2013-07-15 LAB — COMPREHENSIVE METABOLIC PANEL
ALK PHOS: 56 U/L (ref 39–117)
ALT: 8 U/L (ref 0–53)
AST: 15 U/L (ref 0–37)
Albumin: 3.2 g/dL — ABNORMAL LOW (ref 3.5–5.2)
BUN: 46 mg/dL — AB (ref 6–23)
CALCIUM: 9.5 mg/dL (ref 8.4–10.5)
CO2: 26 mEq/L (ref 19–32)
CREATININE: 2.37 mg/dL — AB (ref 0.50–1.35)
Chloride: 102 mEq/L (ref 96–112)
GFR, EST AFRICAN AMERICAN: 26 mL/min — AB (ref >90)
GFR, EST NON AFRICAN AMERICAN: 23 mL/min — AB (ref >90)
Glucose, Bld: 133 mg/dL — ABNORMAL HIGH (ref 70–99)
POTASSIUM: 5.6 meq/L — AB (ref 3.7–5.3)
Sodium: 139 mEq/L (ref 137–147)
Total Bilirubin: 0.3 mg/dL (ref 0.3–1.2)
Total Protein: 7.3 g/dL (ref 6.0–8.3)

## 2013-07-15 LAB — CBC
HCT: 35.2 % — ABNORMAL LOW (ref 39.0–52.0)
Hemoglobin: 11.5 g/dL — ABNORMAL LOW (ref 13.0–17.0)
MCH: 31.1 pg (ref 26.0–34.0)
MCHC: 32.7 g/dL (ref 30.0–36.0)
MCV: 95.1 fL (ref 78.0–100.0)
Platelets: 205 10*3/uL (ref 150–400)
RBC: 3.7 MIL/uL — ABNORMAL LOW (ref 4.22–5.81)
RDW: 13.4 % (ref 11.5–15.5)
WBC: 14.4 10*3/uL — AB (ref 4.0–10.5)

## 2013-07-15 MED ORDER — SODIUM CHLORIDE 0.45 % IV SOLN
INTRAVENOUS | Status: DC
  Administered 2013-07-15: 11:00:00 via INTRAVENOUS

## 2013-07-15 NOTE — Progress Notes (Signed)
Patient ID: [name] [name], male   DOB: [birth year], 80 y.o.   MRN: [id number]    Subjective: [name] [name] had a right perc placed last night for his right hydro.  The final read on the CT suggested possible obstructing stone debris in the distal ureter.  There was also a question of mild left hydronephrosis.  He does have bilateral renal stones.  He is feeling better today.  He is sore from the procedure but has reduced flank and abdominal pain.   He was also noted to have hyperkalemia with an initial K of 6.2 but it has fallen to 5.6.  His Cr is down slightly at 2.37.   ROS:  Review of Systems  Constitutional: Negative for fever and chills.  Respiratory: Negative for shortness of breath.   Cardiovascular: Negative for chest pain.  Gastrointestinal: Negative for nausea.    Anti-infectives: Anti-infectives   Start     Dose/Rate Route Frequency Ordered Stop   07/14/13 1800  cefTRIAXone (ROCEPHIN) 1 g in dextrose 5 % 50 mL IVPB     1 g 100 mL/hr over 30 Minutes Intravenous Every 24 hours 07/14/13 1742     07/14/13 1715  ceFAZolin (ANCEF) IVPB 2 g/50 mL premix     2 g 100 mL/hr over 30 Minutes Intravenous On call 07/14/13 1709 07/14/13 1849      Current Facility-Administered Medications  Medication Dose Route Frequency Provider Last Rate Last Dose  . 0.45 % sodium chloride infusion   Intravenous Continuous Irine Seal, MD      . 0.9 %  sodium chloride infusion   Intravenous Continuous D Rowe Robert, PA-C      . acetaminophen (TYLENOL) tablet 650 mg  650 mg Oral Q4H PRN Irine Seal, MD      . amLODipine (NORVASC) tablet 2.5 mg  2.5 mg Oral Daily Irine Seal, MD   2.5 mg at 07/14/13 2032  . cefTRIAXone (ROCEPHIN) 1 g in dextrose 5 % 50 mL IVPB  1 g Intravenous Q24H Irine Seal, MD      . diphenhydrAMINE (BENADRYL) injection 12.5 mg  12.5 mg Intravenous Q6H PRN Irine Seal, MD       Or  . diphenhydrAMINE (BENADRYL) 12.5 MG/5ML elixir 12.5 mg  12.5 mg Oral Q6H PRN Irine Seal, MD      .  docusate sodium (COLACE) capsule 100 mg  100 mg Oral BID Irine Seal, MD   100 mg at 07/14/13 2136  . HYDROcodone-acetaminophen (NORCO/VICODIN) 5-325 MG per tablet 1-2 tablet  1-2 tablet Oral Q4H PRN Irine Seal, MD      . HYDROmorphone (DILAUDID) injection 0.5-1 mg  0.5-1 mg Intravenous Q2H PRN Irine Seal, MD      . ondansetron [facility]) injection 4 mg  4 mg Intravenous Q4H PRN Irine Seal, MD      . zolpidem (AMBIEN) tablet 5 mg  5 mg Oral QHS PRN Irine Seal, MD         Objective: Vital signs in last 24 hours: Temp:  [98.1 F (36.7 C)-98.9 F (37.2 C)] 98.1 F (36.7 C) (04/23 0540) Pulse Rate:  [73-77] 76 (04/23 0540) Resp:  [19-22] 22 (04/23 0540) BP: (133-169)/(62-85) 133/67 mmHg (04/23 0540) SpO2:  [95 %-100 %] 95 % (04/23 0540) Weight:  [76.522 kg (168 lb 11.2 oz)] 76.522 kg (168 lb 11.2 oz) (04/22 1655)  Intake/Output from previous day: 04/22 0701 - 04/23 0700 In: 628.8 [I.V.:628.8] Out: 705 [Urine:220; Stool:485] Intake/Output this shift: Total I/O In: -  Out: 475 [Urine:175; Stool:300]   Physical Exam  Constitutional: He is well-developed, well-nourished, and in no distress.  Cardiovascular: Normal rate and regular rhythm.   Pulmonary/Chest: Effort normal and breath sounds normal. No respiratory distress.  Abdominal: Soft. Bowel sounds are normal. There is no tenderness. There is no guarding.  Perc in the right flank draining clear urine.     Lab Results:   Recent Labs  07/14/13 1744 07/15/13 0353  WBC 14.7* 14.4*  HGB 12.0* 11.5*  HCT 36.3* 35.2*  PLT 219 205   BMET  Recent Labs  07/14/13 1744 07/15/13 0353  NA 140 139  K 6.2* 5.6*  CL 102 102  CO2 26 26  GLUCOSE 100* 133*  BUN 45* 46*  CREATININE 2.49* 2.37*  CALCIUM 9.4 9.5   PT/INR  Recent Labs  07/14/13 1744  LABPROT 13.8  INR 1.08   ABG No results found for this basename: PHART, PCO2, PO2, HCO3,  in the last 72 hours  Studies/Results: No results found.  Labs reviewed.   I  discussed his case with IR last night.  I have reviewed the IR films and CT report from our office.  Assessment: Right hydro possible from distal ureteral stone debris with fever and acute on chronic renal insufficiency. Bilateral renal stones. Hyperkalemia.  Plan: I have  Removed the K from his IV. Repeat labs in AM.  Since he has a branched calculus in the RLP.  I am going to consider doing a right PCNL to try to eliminate stone while we have access.     LOS: 1 day    Irine Seal 07/15/2013

## 2013-07-15 NOTE — Progress Notes (Signed)
UR completed. Patient changed to inpatient- requiring IV antibiotics and IVF

## 2013-07-15 NOTE — Progress Notes (Signed)
Subjective: Rt PCN placed 4/22 Hx Bladder Ca/ hydronephrosis No complaints  Objective: Vital signs in last 24 hours: Temp:  [98.1 F (36.7 C)-98.9 F (37.2 C)] 98.1 F (36.7 C) (04/23 0540) Pulse Rate:  [73-77] 76 (04/23 0540) Resp:  [19-22] 22 (04/23 0540) BP: (133-169)/(62-85) 133/67 mmHg (04/23 0540) SpO2:  [95 %-100 %] 95 % (04/23 0540) Weight:  [76.522 kg (168 lb 11.2 oz)] 76.522 kg (168 lb 11.2 oz) (04/22 1655) Last BM Date: 07/14/13  Intake/Output from previous day: 04/22 0701 - 04/23 0700 In: 628.8 [I.V.:628.8] Out: 705 [Urine:220; Stool:485] Intake/Output this shift: Total I/O In: 240 [P.O.:240] Out: 475 [Urine:175; Stool:300]  PE:  Afeb; vss R PCN intact NT; clean and dry Output yellow urine 220 cc yesterday 175 cc this am; 100 cc in bag Bun/Cr: 46/2.3 (45/2.49)  Lab Results:   Recent Labs  07/14/13 1744 07/15/13 0353  WBC 14.7* 14.4*  HGB 12.0* 11.5*  HCT 36.3* 35.2*  PLT 219 205   BMET  Recent Labs  07/14/13 1744 07/15/13 0353  NA 140 139  K 6.2* 5.6*  CL 102 102  CO2 26 26  GLUCOSE 100* 133*  BUN 45* 46*  CREATININE 2.49* 2.37*  CALCIUM 9.4 9.5   PT/INR  Recent Labs  07/14/13 1744  LABPROT 13.8  INR 1.08   ABG No results found for this basename: PHART, PCO2, PO2, HCO3,  in the last 72 hours  Studies/Results: Ir Perc Nephrostomy Right  07/15/2013   CLINICAL DATA:  80 year old male with right flank pain, hydronephrosis and fever concerning for obstructed pyonephrosis.  EXAM: PERC NEPHROSTOMY*R*; IR ULTRASOUND GUIDANCE  Date: 07/15/2013  PROCEDURE: 1. Ultrasound guided puncture of the right renal collecting system 2. Placement of a 10 French percutaneous nephrostomy tube with fluoroscopic guidance Interventional Radiologist:  Criselda Peaches, MD  ANESTHESIA/SEDATION: Moderate (conscious) sedation was used. 1 mg Versed, 50 mcg Fentanyl were administered intravenously. The patient's vital signs were monitored continuously by  radiology nursing throughout the procedure.  Sedation Time: 15 minutes  FLUOROSCOPY TIME:  36 seconds  CONTRAST:  5 cc Omnipaque administered into the renal collecting system  TECHNIQUE: 2 g Ancef administered IV prior to skin incision.  Informed consent was obtained from the patient following explanation of the procedure, risks, benefits and alternatives. The patient understands, agrees and consents for the procedure. All questions were addressed. A time out was performed.  Maximal barrier sterile technique utilized including caps, mask, sterile gowns, sterile gloves, large sterile drape, hand hygiene, and Betadine skin prep.  The right flank was interrogated with ultrasound. The right kidney is hydronephrotic. A suitable access site into in interpolar calyx was identified. Local anesthesia was attained by infiltration with 1% lidocaine. A small dermatotomy was made. Under real-time sonographic guidance, a 21 gauge Accustick needle was advanced through the posterolateral right flank and into the renal collecting system. An image was obtained and stored. Confirmation of needle placement was confirmed by return of turbid, foul-smelling urine.  A gentle hand injection of contrast material through the needle confirmed access in the caliectasis and moderate hydronephrosis. An 0.018 wire was coiled within the renal pelvis an Accustick sheath was advanced. The 0.018 wire was exchanged for a short Amplatz wire. The tract was dilated to 10 [place]. A Cook 10 [place] all-purpose drainage catheter was advanced over the wire and formed with the locking loop in the renal pelvis. There is immediate return of turbid foul smelling urine. The catheter was secured to the skin with 0  Prolene suture and a rubber bumper. The tube was connected to gravity bag drainage.  IMPRESSION: Technically successful placement of a 10 French right-sided percutaneous nephrostomy tube.  A sample of urine was sent for culture and sensitivities.   Following renal decompression and resolution of sepsis symptoms, more dedicated antegrade nephroureterogram can be performed to assess the etiology of the patient's distal ureteral obstruction.  Signed,  Criselda Peaches, MD  [facility]  [facility]   Electronically Signed   By: Jacqulynn Cadet M.D.   On: 07/15/2013 08:41   Ir US Guide Bx Asp/drain  07/15/2013   CLINICAL DATA:  80 year old male with right flank pain, hydronephrosis and fever concerning for obstructed pyonephrosis.  EXAM: PERC NEPHROSTOMY*R*; IR ULTRASOUND GUIDANCE  Date: 07/15/2013  PROCEDURE: 1. Ultrasound guided puncture of the right renal collecting system 2. Placement of a 10 French percutaneous nephrostomy tube with fluoroscopic guidance Interventional Radiologist:  Criselda Peaches, MD  ANESTHESIA/SEDATION: Moderate (conscious) sedation was used. 1 mg Versed, 50 mcg Fentanyl were administered intravenously. The patient's vital signs were monitored continuously by radiology nursing throughout the procedure.  Sedation Time: 15 minutes  FLUOROSCOPY TIME:  36 seconds  CONTRAST:  5 cc Omnipaque administered into the renal collecting system  TECHNIQUE: 2 g Ancef administered IV prior to skin incision.  Informed consent was obtained from the patient following explanation of the procedure, risks, benefits and alternatives. The patient understands, agrees and consents for the procedure. All questions were addressed. A time out was performed.  Maximal barrier sterile technique utilized including caps, mask, sterile gowns, sterile gloves, large sterile drape, hand hygiene, and Betadine skin prep.  The right flank was interrogated with ultrasound. The right kidney is hydronephrotic. A suitable access site into in interpolar calyx was identified. Local anesthesia was attained by infiltration with 1% lidocaine. A small dermatotomy was made. Under real-time sonographic guidance, a 21 gauge Accustick  needle was advanced through the posterolateral right flank and into the renal collecting system. An image was obtained and stored. Confirmation of needle placement was confirmed by return of turbid, foul-smelling urine.  A gentle hand injection of contrast material through the needle confirmed access in the caliectasis and moderate hydronephrosis. An 0.018 wire was coiled within the renal pelvis an Accustick sheath was advanced. The 0.018 wire was exchanged for a short Amplatz wire. The tract was dilated to 10 [place]. A Cook 10 [place] all-purpose drainage catheter was advanced over the wire and formed with the locking loop in the renal pelvis. There is immediate return of turbid foul smelling urine. The catheter was secured to the skin with 0 Prolene suture and a rubber bumper. The tube was connected to gravity bag drainage.  IMPRESSION: Technically successful placement of a 10 French right-sided percutaneous nephrostomy tube.  A sample of urine was sent for culture and sensitivities.  Following renal decompression and resolution of sepsis symptoms, more dedicated antegrade nephroureterogram can be performed to assess the etiology of the patient's distal ureteral obstruction.  Signed,  Criselda Peaches, MD  [facility]  [facility]   Electronically Signed   By: Jacqulynn Cadet M.D.   On: 07/15/2013 08:41    Anti-infectives: Anti-infectives   Start     Dose/Rate Route Frequency Ordered Stop   07/14/13 1800  cefTRIAXone (ROCEPHIN) 1 g in dextrose 5 % 50 mL IVPB     1 g 100 mL/hr over 30 Minutes Intravenous Every 24 hours 07/14/13 1742  07/14/13 1715  ceFAZolin (ANCEF) IVPB 2 g/50 mL premix     2 g 100 mL/hr over 30 Minutes Intravenous On call 07/14/13 1709 07/14/13 1849      Assessment/Plan: s/p * No surgery found *  Rt PCN placed 4/22 Good output Will follow Plan per Uro   LOS: 1 day    Lavonia Drafts 07/15/2013

## 2013-07-16 ENCOUNTER — Other Ambulatory Visit: Payer: Self-pay | Admitting: Radiology

## 2013-07-16 DIAGNOSIS — N139 Obstructive and reflux uropathy, unspecified: Secondary | ICD-10-CM

## 2013-07-16 DIAGNOSIS — N1330 Unspecified hydronephrosis: Secondary | ICD-10-CM

## 2013-07-16 LAB — BASIC METABOLIC PANEL
BUN: 40 mg/dL — AB (ref 6–23)
CO2: 23 meq/L (ref 19–32)
CREATININE: 1.97 mg/dL — AB (ref 0.50–1.35)
Calcium: 8.5 mg/dL (ref 8.4–10.5)
Chloride: 100 mEq/L (ref 96–112)
GFR calc Af Amer: 33 mL/min — ABNORMAL LOW (ref >90)
GFR calc non Af Amer: 28 mL/min — ABNORMAL LOW (ref >90)
GLUCOSE: 107 mg/dL — AB (ref 70–99)
Potassium: 4.7 mEq/L (ref 3.7–5.3)
Sodium: 134 mEq/L — ABNORMAL LOW (ref 137–147)

## 2013-07-16 LAB — CBC
HCT: 33.8 % — ABNORMAL LOW (ref 39.0–52.0)
Hemoglobin: 11.3 g/dL — ABNORMAL LOW (ref 13.0–17.0)
MCH: 31.5 pg (ref 26.0–34.0)
MCHC: 33.4 g/dL (ref 30.0–36.0)
MCV: 94.2 fL (ref 78.0–100.0)
Platelets: 202 10*3/uL (ref 150–400)
RBC: 3.59 MIL/uL — ABNORMAL LOW (ref 4.22–5.81)
RDW: 13.4 % (ref 11.5–15.5)
WBC: 10 10*3/uL (ref 4.0–10.5)

## 2013-07-16 MED ORDER — HYDROCODONE-ACETAMINOPHEN 5-325 MG PO TABS: 1.0000 | ORAL_TABLET | Freq: Four times a day (QID) | ORAL | Status: DC | PRN

## 2013-07-16 MED ORDER — CEPHALEXIN 250 MG PO CAPS: 250.0000 mg | ORAL_CAPSULE | Freq: Three times a day (TID) | ORAL | Status: DC

## 2013-07-16 NOTE — Progress Notes (Addendum)
Subjective: Rt PCN placed 4/22 Hx Bladder Ca/ hydronephrosis No complaints  Objective: Vital signs in last 24 hours: Temp:  [98.1 F (36.7 C)-99 F (37.2 C)] 98.3 F (36.8 C) (04/24 0624) Pulse Rate:  [68-78] 78 (04/24 0624) Resp:  [20] 20 (04/24 0624) BP: (143-170)/(70-84) 170/84 mmHg (04/24 0624) SpO2:  [96 %-97 %] 97 % (04/24 0624) Last BM Date: 07/14/13  PE:  Afeb; vss R PCN intact, site clean, dry. Mildly tender Output yellow urine, 725 mL yesterday. Urostomy bag also full this am.   Lab Results:   Recent Labs  07/15/13 0353 07/16/13 0340  WBC 14.4* 10.0  HGB 11.5* 11.3*  HCT 35.2* 33.8*  PLT 205 202   BMET  Recent Labs  07/15/13 0353 07/16/13 0340  NA 139 134*  K 5.6* 4.7  CL 102 100  CO2 26 23  GLUCOSE 133* 107*  BUN 46* 40*  CREATININE 2.37* 1.97*  CALCIUM 9.5 8.5   PT/INR  Recent Labs  07/14/13 1744  LABPROT 13.8  INR 1.08   ABG No results found for this basename: PHART, PCO2, PO2, HCO3,  in the last 72 hours  Studies/Results: Ir Perc Nephrostomy Right  07/15/2013   CLINICAL DATA:  80 year old male with right flank pain, hydronephrosis and fever concerning for obstructed pyonephrosis.  EXAM: PERC NEPHROSTOMY*R*; IR ULTRASOUND GUIDANCE  Date: 07/15/2013  PROCEDURE: 1. Ultrasound guided puncture of the right renal collecting system 2. Placement of a 10 French percutaneous nephrostomy tube with fluoroscopic guidance Interventional Radiologist:  Criselda Peaches, MD  ANESTHESIA/SEDATION: Moderate (conscious) sedation was used. 1 mg Versed, 50 mcg Fentanyl were administered intravenously. The patient's vital signs were monitored continuously by radiology nursing throughout the procedure.  Sedation Time: 15 minutes  FLUOROSCOPY TIME:  36 seconds  CONTRAST:  5 cc Omnipaque administered into the renal collecting system  TECHNIQUE: 2 g Ancef administered IV prior to skin incision.  Informed consent was obtained from the patient following explanation  of the procedure, risks, benefits and alternatives. The patient understands, agrees and consents for the procedure. All questions were addressed. A time out was performed.  Maximal barrier sterile technique utilized including caps, mask, sterile gowns, sterile gloves, large sterile drape, hand hygiene, and Betadine skin prep.  The right flank was interrogated with ultrasound. The right kidney is hydronephrotic. A suitable access site into in interpolar calyx was identified. Local anesthesia was attained by infiltration with 1% lidocaine. A small dermatotomy was made. Under real-time sonographic guidance, a 21 gauge Accustick needle was advanced through the posterolateral right flank and into the renal collecting system. An image was obtained and stored. Confirmation of needle placement was confirmed by return of turbid, foul-smelling urine.  A gentle hand injection of contrast material through the needle confirmed access in the caliectasis and moderate hydronephrosis. An 0.018 wire was coiled within the renal pelvis an Accustick sheath was advanced. The 0.018 wire was exchanged for a short Amplatz wire. The tract was dilated to 10 [place]. A Cook 10 [place] all-purpose drainage catheter was advanced over the wire and formed with the locking loop in the renal pelvis. There is immediate return of turbid foul smelling urine. The catheter was secured to the skin with 0 Prolene suture and a rubber bumper. The tube was connected to gravity bag drainage.  IMPRESSION: Technically successful placement of a 10 French right-sided percutaneous nephrostomy tube.  A sample of urine was sent for culture and sensitivities.  Following renal decompression and resolution of sepsis symptoms, more  dedicated antegrade nephroureterogram can be performed to assess the etiology of the patient's distal ureteral obstruction.  Signed,  Criselda Peaches, MD  [facility]  [facility]    Electronically Signed   By: Jacqulynn Cadet M.D.   On: 07/15/2013 08:41   Ir US Guide Bx Asp/drain  07/15/2013   CLINICAL DATA:  80 year old male with right flank pain, hydronephrosis and fever concerning for obstructed pyonephrosis.  EXAM: PERC NEPHROSTOMY*R*; IR ULTRASOUND GUIDANCE  Date: 07/15/2013  PROCEDURE: 1. Ultrasound guided puncture of the right renal collecting system 2. Placement of a 10 French percutaneous nephrostomy tube with fluoroscopic guidance Interventional Radiologist:  Criselda Peaches, MD  ANESTHESIA/SEDATION: Moderate (conscious) sedation was used. 1 mg Versed, 50 mcg Fentanyl were administered intravenously. The patient's vital signs were monitored continuously by radiology nursing throughout the procedure.  Sedation Time: 15 minutes  FLUOROSCOPY TIME:  36 seconds  CONTRAST:  5 cc Omnipaque administered into the renal collecting system  TECHNIQUE: 2 g Ancef administered IV prior to skin incision.  Informed consent was obtained from the patient following explanation of the procedure, risks, benefits and alternatives. The patient understands, agrees and consents for the procedure. All questions were addressed. A time out was performed.  Maximal barrier sterile technique utilized including caps, mask, sterile gowns, sterile gloves, large sterile drape, hand hygiene, and Betadine skin prep.  The right flank was interrogated with ultrasound. The right kidney is hydronephrotic. A suitable access site into in interpolar calyx was identified. Local anesthesia was attained by infiltration with 1% lidocaine. A small dermatotomy was made. Under real-time sonographic guidance, a 21 gauge Accustick needle was advanced through the posterolateral right flank and into the renal collecting system. An image was obtained and stored. Confirmation of needle placement was confirmed by return of turbid, foul-smelling urine.  A gentle hand injection of contrast material through the needle confirmed access in  the caliectasis and moderate hydronephrosis. An 0.018 wire was coiled within the renal pelvis an Accustick sheath was advanced. The 0.018 wire was exchanged for a short Amplatz wire. The tract was dilated to 10 [place]. A Cook 10 [place] all-purpose drainage catheter was advanced over the wire and formed with the locking loop in the renal pelvis. There is immediate return of turbid foul smelling urine. The catheter was secured to the skin with 0 Prolene suture and a rubber bumper. The tube was connected to gravity bag drainage.  IMPRESSION: Technically successful placement of a 10 French right-sided percutaneous nephrostomy tube.  A sample of urine was sent for culture and sensitivities.  Following renal decompression and resolution of sepsis symptoms, more dedicated antegrade nephroureterogram can be performed to assess the etiology of the patient's distal ureteral obstruction.  Signed,  Criselda Peaches, MD  [facility]  [facility]   Electronically Signed   By: Jacqulynn Cadet M.D.   On: 07/15/2013 08:41    Anti-infectives: Anti-infectives   Start     Dose/Rate Route Frequency Ordered Stop   07/16/13 0000  cephALEXin (KEFLEX) 250 MG capsule     250 mg Oral 3 times daily 07/16/13 0728     07/14/13 1800  cefTRIAXone (ROCEPHIN) 1 g in dextrose 5 % 50 mL IVPB     1 g 100 mL/hr over 30 Minutes Intravenous Every 24 hours 07/14/13 1742     07/14/13 1715  ceFAZolin (ANCEF) IVPB 2 g/50 mL premix     2 g 100 mL/hr over 30 Minutes  Intravenous On call 07/14/13 1709 07/14/13 1849      Assessment/Plan: s/p Rt PCN placed 4/22 Good output Dr. Jeffie Pollock note and plan reviewed. Will try for conversion to nephroureteral catheter today prior to discharge. Discussed procedure, risks, complications with pt.  Ascencion Dike 07/16/2013   Addendum: Unfortunately, IR dept does not have the necessary nephroureteral catheters in stock. Ordered to be in [place]. D/w Dr.  Jeffie Pollock, will leave pt PCN to gravity bag. Have scheduled pt to return as output for conversion to Nephroureteral on Tues 4/28. Instructions provided to pt and family.   Ascencion Dike PA-C Interventional Radiology 07/16/2013 11:36 AM

## 2013-07-16 NOTE — Discharge Summary (Addendum)
Physician Discharge Summary  Patient ID: [name] [name] MRN: [id number] DOB/AGE: 05-20-1921 80 y.o.  Admit date: 07/14/2013 Discharge date: 07/16/2013  Admission Diagnoses:  Hydronephrosis, right  Discharge Diagnoses:  Principal Problem:   Hydronephrosis, right Active Problems:   Infection of urinary tract   Hyperkalemia Acute on Chronic renal insufficiency  Past Medical History  Diagnosis Date  . Hypertension   . Aortic valve stenosis   . Gout   . Hx of bladder cancer   . Deep venous thrombosis     hx of  . Osteoporosis   . Renal failure   . Heart murmur   . Depression   . Chronic kidney disease   . Kidney stone   . Prostate cancer     AND BLADDER CANCER - S/P URETEROILEAL CONDUIT  . 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 Cristopher [name] -ELECTROPHYSIOLOGIST    Surgeries:  on * No surgery found *   Consultants (if any):    Discharged Condition: Improved  Hospital Course: [name] [name] is an 80 y.o. male who was admitted 07/14/2013 with a diagnosis of Hydronephrosis, right and febrile UTI.    He was taken to IR and a right perc tube was placed.  He had Acute on Chronic renal insufficiency that has improved post drainage.  His WBC has normalized.   He had hyperkalemia on admission but that has also normalize.   He was given Rocephin pending cultures.  One culture is growing Mx species from the nephrostomy placement.  The obstruction is felt to be secondary to distal stone debris at the ureteroileal anastomosis but he also has a branched calculus in the LLP that has increased in size over the last two years.   He will go to have a nephroureteral catheter placed today to aid future antegrade ureteroscopy.   He will be discharged home after the tube exchange.   He was given perioperative antibiotics:  Anti-infectives   Start     Dose/Rate Route Frequency Ordered Stop   07/16/13 0000  cephALEXin (KEFLEX) 250 MG capsule     250 mg Oral 3 times daily  07/16/13 0728     07/14/13 1800  cefTRIAXone (ROCEPHIN) 1 g in dextrose 5 % 50 mL IVPB     1 g 100 mL/hr over 30 Minutes Intravenous Every 24 hours 07/14/13 1742     07/14/13 1715  ceFAZolin (ANCEF) IVPB 2 g/50 mL premix     2 g 100 mL/hr over 30 Minutes Intravenous On call 07/14/13 1709 07/14/13 1849    .  He was given sequential compression devices and early ambulation for DVT prophylaxis.  He benefited maximally from the hospital stay and there were no complications.    Recent vital signs:  Filed Vitals:   07/16/13 0624  BP: 170/84  Pulse: 78  Temp: 98.3 F (36.8 C)  Resp: 20    Recent laboratory studies:  Lab Results  Component Value Date   HGB 11.3* 07/16/2013   HGB 11.5* 07/15/2013   HGB 12.0* 07/14/2013   Lab Results  Component Value Date   WBC 10.0 07/16/2013   PLT 202 07/16/2013   Lab Results  Component Value Date   INR 1.08 07/14/2013   Lab Results  Component Value Date   NA 134* 07/16/2013   K 4.7 07/16/2013   CL 100 07/16/2013   CO2 23 07/16/2013   BUN 40* 07/16/2013   CREATININE 1.97* 07/16/2013   GLUCOSE 107* 07/16/2013    Discharge  Medications:     Medication List         amLODipine 2.5 MG tablet  Commonly known as:  NORVASC  Take 2.5 mg by mouth daily.     aspirin 81 MG tablet  Take 1 tablet (81 mg total) by mouth daily.     cephALEXin 250 MG capsule  Commonly known as:  KEFLEX  Take 1 capsule (250 mg total) by mouth 3 (three) times daily.     docusate sodium 100 MG capsule  Commonly known as:  COLACE  Take 100 mg by mouth 2 (two) times daily.     HYDROcodone-acetaminophen 5-325 MG per tablet  Commonly known as:  NORCO/VICODIN  Take 1 tablet by mouth every 6 (six) hours as needed for moderate pain.     OVER THE COUNTER MEDICATION  Take 2 tablets by mouth daily. Takes ARES 2 (eye vitamin)     vitamin C 500 MG tablet  Commonly known as:  ASCORBIC ACID  Take 500 mg by mouth daily.     VITAMIN D-3 PO  Take by mouth. 1000 iu  daily      vitamin E 400 UNIT capsule  Take 400 Units by mouth daily.        Diagnostic Studies: Ir Perc Nephrostomy Right  07/15/2013   CLINICAL DATA:  80 year old male with right flank pain, hydronephrosis and fever concerning for obstructed pyonephrosis.  EXAM: PERC NEPHROSTOMY*R*; IR ULTRASOUND GUIDANCE  Date: 07/15/2013  PROCEDURE: 1. Ultrasound guided puncture of the right renal collecting system 2. Placement of a 10 French percutaneous nephrostomy tube with fluoroscopic guidance Interventional Radiologist:  [name] Peaches, MD  ANESTHESIA/SEDATION: Moderate (conscious) sedation was used. 1 mg Versed, 50 mcg Fentanyl were administered intravenously. The patient's vital signs were monitored continuously by radiology nursing throughout the procedure.  Sedation Time: 15 minutes  FLUOROSCOPY TIME:  36 seconds  CONTRAST:  5 cc Omnipaque administered into the renal collecting system  TECHNIQUE: 2 g Ancef administered IV prior to skin incision.  Informed consent was obtained from the patient following explanation of the procedure, risks, benefits and alternatives. The patient understands, agrees and consents for the procedure. All questions were addressed. A time out was performed.  [name] [name] barrier sterile technique utilized including caps, mask, sterile gowns, sterile gloves, large sterile drape, hand hygiene, and Betadine skin prep.  The right flank was interrogated with ultrasound. The right kidney is hydronephrotic. A suitable access site into in interpolar calyx was identified. Local anesthesia was attained by infiltration with 1% lidocaine. A small dermatotomy was made. Under real-time sonographic guidance, a 21 gauge Accustick needle was advanced through the posterolateral right flank and into the renal collecting system. An image was obtained and stored. Confirmation of needle placement was confirmed by return of turbid, foul-smelling urine.  A gentle hand injection of contrast material through the needle  confirmed access in the caliectasis and moderate hydronephrosis. An 0.018 wire was coiled within the renal pelvis an Accustick sheath was advanced. The 0.018 wire was exchanged for a short Amplatz wire. The tract was dilated to 10 [place]. A Cook 10 [place] all-purpose drainage catheter was advanced over the wire and formed with the locking loop in the renal pelvis. There is immediate return of turbid foul smelling urine. The catheter was secured to the skin with 0 Prolene suture and a rubber bumper. The tube was connected to gravity bag drainage.  IMPRESSION: Technically successful placement of a 10 French right-sided percutaneous nephrostomy tube.  A sample of  urine was sent for culture and sensitivities.  Following renal decompression and resolution of sepsis symptoms, more dedicated antegrade nephroureterogram can be performed to assess the etiology of the patient's distal ureteral obstruction.  Signed,  [name] Peaches, MD  [facility]  [facility]   Electronically Signed   By: Jacqulynn Cadet M.D.   On: 07/15/2013 08:41   Ir US Guide Bx Asp/drain  07/15/2013   CLINICAL DATA:  80 year old male with right flank pain, hydronephrosis and fever concerning for obstructed pyonephrosis.  EXAM: PERC NEPHROSTOMY*R*; IR ULTRASOUND GUIDANCE  Date: 07/15/2013  PROCEDURE: 1. Ultrasound guided puncture of the right renal collecting system 2. Placement of a 10 French percutaneous nephrostomy tube with fluoroscopic guidance Interventional Radiologist:  [name] Peaches, MD  ANESTHESIA/SEDATION: Moderate (conscious) sedation was used. 1 mg Versed, 50 mcg Fentanyl were administered intravenously. The patient's vital signs were monitored continuously by radiology nursing throughout the procedure.  Sedation Time: 15 minutes  FLUOROSCOPY TIME:  36 seconds  CONTRAST:  5 cc Omnipaque administered into the renal collecting system  TECHNIQUE: 2 g Ancef administered IV prior to  skin incision.  Informed consent was obtained from the patient following explanation of the procedure, risks, benefits and alternatives. The patient understands, agrees and consents for the procedure. All questions were addressed. A time out was performed.  [name] [name] barrier sterile technique utilized including caps, mask, sterile gowns, sterile gloves, large sterile drape, hand hygiene, and Betadine skin prep.  The right flank was interrogated with ultrasound. The right kidney is hydronephrotic. A suitable access site into in interpolar calyx was identified. Local anesthesia was attained by infiltration with 1% lidocaine. A small dermatotomy was made. Under real-time sonographic guidance, a 21 gauge Accustick needle was advanced through the posterolateral right flank and into the renal collecting system. An image was obtained and stored. Confirmation of needle placement was confirmed by return of turbid, foul-smelling urine.  A gentle hand injection of contrast material through the needle confirmed access in the caliectasis and moderate hydronephrosis. An 0.018 wire was coiled within the renal pelvis an Accustick sheath was advanced. The 0.018 wire was exchanged for a short Amplatz wire. The tract was dilated to 10 [place]. A Cook 10 [place] all-purpose drainage catheter was advanced over the wire and formed with the locking loop in the renal pelvis. There is immediate return of turbid foul smelling urine. The catheter was secured to the skin with 0 Prolene suture and a rubber bumper. The tube was connected to gravity bag drainage.  IMPRESSION: Technically successful placement of a 10 French right-sided percutaneous nephrostomy tube.  A sample of urine was sent for culture and sensitivities.  Following renal decompression and resolution of sepsis symptoms, more dedicated antegrade nephroureterogram can be performed to assess the etiology of the patient's distal ureteral obstruction.  Signed,  [name] Peaches, MD   [facility]  [facility]   Electronically Signed   By: Jacqulynn Cadet M.D.   On: 07/15/2013 08:41    Disposition: 06-Home-Health Care Svc  He will go home with his daughter.         Future Appointments Provider Department Dept Phone   05/16/2014 2:15 PM Hayden Pedro, MD [facility] [phone]      Follow-up Information   Follow up with Malka So, MD. (My office will call to schedule the next procedure. )    Specialty:  Urology   Contact information:  [facility]  [place] [address] [phone]        Signed: Irine Seal 07/16/2013, 7:29 AM  A nephroureteral catheter was not available so he will return Tuesday for the tube exchange and will have the PCNL on 5/5.

## 2013-07-16 NOTE — Discharge Instructions (Signed)
Percutaneous Nephrostomy Home Guide A nephrostomy tube allows urine to leave your body when a medical condition prevents it from leaving your kidney normally. Urine is normally carried from the kidneys to the bladder through narrow tubes called ureters. The ureter can become obstructed due to conditions such as kidney stones, tumors, infection, or blood clots. A nephrostomy tube is a hollow, flexible tube placed into the kidney to restore the flow of urine. The tube is placed on the right or left side of your lower back and is connected to an external drainage bag. PERSONAL HYGIENE  You may shower unless otherwise told by your health care provider. Prepare for a shower by placing a plastic covering over the nephrostomy tube dressing.  Change the dressing immediately after showering. Make sure your skin around the nephrostomy tub exit site is dry.  Avoid immersing your nephrostomy tube in water, such as taking a bath or swimming. NEPHROSTOMY TUBE CARE  Your nephrostomy tube is connected to a leg bag or bedside drainage bag. Always keep your tubing, leg bag, or bedside drainage bag below the level of your kidney so that your urine drains freely.  Your activity is not restricted as long as your activity does not pull or tug on your tube.  During the day, if you are connecting your nephrostomy tube to a leg bag, ensure that your tubing does not have any kinks and that your urine is draining freely. One helpful technique to prevent kinking or inadvertent dislodging of your nephrostomy tubing is to gently wrap an elsatic bandage over the tubing. This will help to secure the tubing in place. Make sure there is no tension on the tubing so it does not become dislodged.  At night, you may want to connect your nephrostomy tube to a larger bedside drainage bag. EMPTYING THE LEG BAG OR BEDSIDE DRAINAGE BAG  The leg bag or bedside drainage bag should be emptied when it becomes  full and before going to sleep.  Most leg bags and bedside drainage bags have a drain at the bottom that allows urine to be emptied. 1. Hold the leg bag or bedside drainage bag over a toilet or collection container. Use a measuring container if you are directed to measure your urine. 2. Open the drain and allow the urine to drain. 3. Once all the urine is drained from the leg bag or bedside drainage bag, close the drain fully to avoid urine leakage. 4. Flush urine down toilet. If a collection container was used, rinse the container. NEPHROSTOMY TUBE EXIT SITE CARE The exit site for your nephrostomy tube is covered with a bandage (dressing). Clean your exit site and change your dressing as directed by your health care provider or if your dressing becomes wet. Supplies Needed:  Mild soap and water.  44 inch (10x10 cm) split gauze pads.  44 inch (10x10 cm) gauze pads.  Paper tape. Exit Site Care and Dressing Change: For the first 2 weeks after having a nephrostomy tube inserted, you should change the dressing every day. After 2 weeks, you can change the dressing 2 times per week, whenever the dressing becomes wet, or as told by your health care provider. Because of the location of your nephrostomy tube, you may need help from another person to complete dressing changes. The steps to changing a dressing are: 1. Wash hands well with soap and water. 2. Gently remove the tapes and dressing from around the nephrostomy tube. Be careful not to pull on the  tube while removing the dressing. Avoid using scissors to remove the dressing since this may lead to accidental damage to the tube. 3. Wash the skin around the tube with the mild soap water, rinse well, and dry with a clean cloth. 4. Inspect the skin around the drain for redness, swelling, and foul-smelling yellow or green discharge. 5. If the drain was sutured to the skin, inspect the suture to verify that it is still anchored in the skin. 6. Place two split gauze pads in and around  the tube exit site. Do not apply ointments or alcohol to the site. 7. Place a gauze pad on top of the split gauze pad. 8. Coil the tube on top of the gauze. The tubing should rest on the gauze, not on the skin. 9. Place tape around each edge of the gauze pad. 10. Secure the nephrostomy tubing. Ensure that the nephrostomy tube does not kink or become pinched. The tubing should rest on the gauze pad and not on the skin. 11. Dispose of used supplies properly. FLUSHING YOUR NEPHROSTOMY TUBE  Flush your nephrostomy tube as directed by your health care provider. Flushing of a nephrostomy tube is easier if a three-way stopcock is placed between the tube and the leg bag or bedside drainage bag. One connection of the three-way stopcock connects to your tube, the second connects to the leg bag or bedside drainage bag, and the third connection is usually covered with a cap. The three-way stopcock lever points to the direction on the stopcock that is closed to flow. Normally, the lever points in the direction of the cap to allow urine to drain from the tube to the leg bag or bedside drainage bag. Supplies Needed:  Rubbing alcohol wipe.  10 mL 0.9% saline syringe. Flushing Your Nephrostomy Tube 1. Gather needed supplies. 2. Move the lever of the three-way stopcock so that it points towards the leg bag or bedside drainage bag. 3. Clean the cap with a rubbing alcohol wipe and then screw the tip of a 10 mL 0.9% saline syringe onto the cap. 4. Using the syringe plunger, slowly push the 10 mL 0.9% saline in the syringe over 5 10 seconds. If resistance is met or pain occurs while pushing, stop pushing the saline immediately. 5. Remove the syringe from the cap. 6. Return the stopcock lever to the usual position which is pointing in the direction of the cap. 7. Dispose of used supplies properly. REPLACING YOUR LEG BAG OR BEDSIDE DRAINAGE BAG  Replace your leg bag or bedside drainage bag, three-way stopcock, and any  extension tubing as directed by your health care provider. Make sure you always have an extra drainage bag and connecting tubing available. 1. Empty urine from your leg bag or bedside drainage bag. 2. Gather new leg bag or bedside drainage bag, three-way stopcock, and any extension tubing. 3. Remove the leg bag or bedside drainage bag, three-way stopcock, and any extension tubing from the nephrostomy tube. 4. Attach the new leg bag or bedside drainage bag, three-way stopcock, and any extension tubing to the nephrostomy tube. 5. Dispose of the used leg bag or bedside drainage bag, three-way stopcock, and any extension tubing from the nephrostomy tube. SEEK IMMEDIATE MEDICAL CARE IF  You have a fever or chills.  You have abdominal pain during the first week after nephrostomy tube insertion.  You have new appearance of blood in your urine.  You have back pain, redness, swelling or drainage at the nephrostomy tube insertion  site.  You have difficulty or pain with flushing the tube.  You notice a decrease in your urine output not explained by drinking less fluids.  Your nephrostomy tube comes out or the suture securing the tube comes free. Document Released: 12/30/2012 Document Reviewed: 10/27/2012 [facility] Patient Information 2014 [place], [place].

## 2013-07-18 LAB — URINE CULTURE: Colony Count: >=100000

## 2013-07-20 ENCOUNTER — Ambulatory Visit (HOSPITAL_COMMUNITY)
Admit: 2013-07-20 | Discharge: 2013-07-20 | Disposition: A | Discharge status: Home / Self Care | Payer: Medicare Other | Attending: Urology | Admitting: Urology

## 2013-07-20 ENCOUNTER — Ambulatory Visit (HOSPITAL_COMMUNITY)
Admit: 2013-07-20 | Discharge: 2013-07-20 | Disposition: A | Discharge status: Home / Self Care | Payer: Medicare Other | Source: Ambulatory Visit | Attending: Radiology | Admitting: Radiology

## 2013-07-20 ENCOUNTER — Other Ambulatory Visit: Payer: Self-pay | Admitting: Radiology

## 2013-07-20 ENCOUNTER — Encounter (HOSPITAL_COMMUNITY): Payer: Self-pay

## 2013-07-20 DIAGNOSIS — N189 Chronic kidney disease, unspecified: Secondary | ICD-10-CM | POA: Insufficient documentation

## 2013-07-20 DIAGNOSIS — Z9089 Acquired absence of other organs: Secondary | ICD-10-CM | POA: Insufficient documentation

## 2013-07-20 DIAGNOSIS — N139 Obstructive and reflux uropathy, unspecified: Secondary | ICD-10-CM

## 2013-07-20 DIAGNOSIS — Z436 Encounter for attention to other artificial openings of urinary tract: Secondary | ICD-10-CM | POA: Insufficient documentation

## 2013-07-20 DIAGNOSIS — N1330 Unspecified hydronephrosis: Secondary | ICD-10-CM

## 2013-07-20 DIAGNOSIS — Z8546 Personal history of malignant neoplasm of prostate: Secondary | ICD-10-CM | POA: Insufficient documentation

## 2013-07-20 DIAGNOSIS — M810 Age-related osteoporosis without current pathological fracture: Secondary | ICD-10-CM | POA: Insufficient documentation

## 2013-07-20 DIAGNOSIS — Z87891 Personal history of nicotine dependence: Secondary | ICD-10-CM | POA: Insufficient documentation

## 2013-07-20 DIAGNOSIS — N200 Calculus of kidney: Secondary | ICD-10-CM | POA: Insufficient documentation

## 2013-07-20 DIAGNOSIS — Z950 Presence of cardiac pacemaker: Secondary | ICD-10-CM | POA: Insufficient documentation

## 2013-07-20 DIAGNOSIS — I129 Hypertensive chronic kidney disease with stage 1 through stage 4 chronic kidney disease, or unspecified chronic kidney disease: Secondary | ICD-10-CM | POA: Insufficient documentation

## 2013-07-20 DIAGNOSIS — Z8551 Personal history of malignant neoplasm of bladder: Secondary | ICD-10-CM | POA: Insufficient documentation

## 2013-07-20 DIAGNOSIS — Z8042 Family history of malignant neoplasm of prostate: Secondary | ICD-10-CM | POA: Insufficient documentation

## 2013-07-20 DIAGNOSIS — M109 Gout, unspecified: Secondary | ICD-10-CM | POA: Insufficient documentation

## 2013-07-20 DIAGNOSIS — Z86718 Personal history of other venous thrombosis and embolism: Secondary | ICD-10-CM | POA: Insufficient documentation

## 2013-07-20 DIAGNOSIS — Z79899 Other long term (current) drug therapy: Secondary | ICD-10-CM | POA: Insufficient documentation

## 2013-07-20 LAB — CBC WITH DIFFERENTIAL/PLATELET
BASOS ABS: 0 10*3/uL (ref 0.0–0.1)
Basophils Relative: 0 % (ref 0–1)
EOS ABS: 0.8 10*3/uL — AB (ref 0.0–0.7)
EOS PCT: 9 % — AB (ref 0–5)
HEMATOCRIT: 38.4 % — AB (ref 39.0–52.0)
Hemoglobin: 12.7 g/dL — ABNORMAL LOW (ref 13.0–17.0)
LYMPHS PCT: 26 % (ref 12–46)
Lymphs Abs: 2.4 10*3/uL (ref 0.7–4.0)
MCH: 31.3 pg (ref 26.0–34.0)
MCHC: 33.1 g/dL (ref 30.0–36.0)
MCV: 94.6 fL (ref 78.0–100.0)
MONO ABS: 0.7 10*3/uL (ref 0.1–1.0)
Monocytes Relative: 7 % (ref 3–12)
Neutro Abs: 5.1 10*3/uL (ref 1.7–7.7)
Neutrophils Relative %: 57 % (ref 43–77)
Platelets: 269 10*3/uL (ref 150–400)
RBC: 4.06 MIL/uL — ABNORMAL LOW (ref 4.22–5.81)
RDW: 13.1 % (ref 11.5–15.5)
WBC: 9 10*3/uL (ref 4.0–10.5)

## 2013-07-20 LAB — BASIC METABOLIC PANEL
BUN: 24 mg/dL — AB (ref 6–23)
CHLORIDE: 104 meq/L (ref 96–112)
CO2: 25 mEq/L (ref 19–32)
CREATININE: 1.65 mg/dL — AB (ref 0.50–1.35)
Calcium: 9.2 mg/dL (ref 8.4–10.5)
GFR calc Af Amer: 41 mL/min — ABNORMAL LOW (ref >90)
GFR calc non Af Amer: 35 mL/min — ABNORMAL LOW (ref >90)
GLUCOSE: 90 mg/dL (ref 70–99)
Potassium: 4.7 mEq/L (ref 3.7–5.3)
Sodium: 142 mEq/L (ref 137–147)

## 2013-07-20 LAB — PROTIME-INR
INR: 0.88 (ref 0.00–1.49)
Prothrombin Time: 11.8 seconds (ref 11.6–15.2)

## 2013-07-20 MED ORDER — SODIUM CHLORIDE 0.9 % IV SOLN
INTRAVENOUS | Status: DC
  Administered 2013-07-20: 08:00:00 via INTRAVENOUS

## 2013-07-20 MED ORDER — FENTANYL CITRATE 0.05 MG/ML IJ SOLN
INTRAMUSCULAR | Status: AC | PRN
  Administered 2013-07-20 (×2): 25 ug via INTRAVENOUS

## 2013-07-20 MED ORDER — MIDAZOLAM HCL 2 MG/2ML IJ SOLN
INTRAMUSCULAR | Status: AC
  Filled 2013-07-20: qty 6

## 2013-07-20 MED ORDER — MIDAZOLAM HCL 2 MG/2ML IJ SOLN
INTRAMUSCULAR | Status: AC | PRN
  Administered 2013-07-20: 0.5 mg via INTRAVENOUS
  Administered 2013-07-20: 1 mg via INTRAVENOUS

## 2013-07-20 MED ORDER — IOHEXOL 300 MG/ML  SOLN
20.0000 mL | Freq: Once | INTRAMUSCULAR | Status: AC | PRN
  Administered 2013-07-20: 1 mL

## 2013-07-20 MED ORDER — LIDOCAINE-EPINEPHRINE (PF) 2 %-1:200000 IJ SOLN
INTRAMUSCULAR | Status: AC
  Filled 2013-07-20: qty 20

## 2013-07-20 MED ORDER — DEXTROSE 5 % IV SOLN
1.0000 g | Freq: Once | INTRAVENOUS | Status: AC
  Administered 2013-07-20: 1 g via INTRAVENOUS
  Filled 2013-07-20: qty 10

## 2013-07-20 MED ORDER — FENTANYL CITRATE 0.05 MG/ML IJ SOLN
INTRAMUSCULAR | Status: AC
  Filled 2013-07-20: qty 6

## 2013-07-20 NOTE — H&P (Signed)
Chief Complaint: "I am here for a procedure for my right kidney tube." Referring Physician: Dr. Jeffie Pollock HPI: [name] [name] is an 80 y.o. male with history of bladder cancer s/p complete cystectomy, ureteroileal conduit and lymphadenectomy. Patient with previous left PCNL 08/2011. Presented with bilateral nephrolithiasis right hydronephrosis s/p right PCN 07/14/13. He is scheduled today for image guided conversion of right nephrostomy tube to nephroureteral catheter to aid in future antegrade ureteroscopy. He denies any chest pain, shortness of breath or palpitations. He denies any active signs of bleeding or excessive bruising. He denies any recent fever or chills. The patient denies any history of sleep apnea or chronic oxygen use. He has previously tolerated sedation without complications.   Past Medical History:  Past Medical History  Diagnosis Date  . Hypertension   . Aortic valve stenosis   . Gout   . Hx of bladder cancer   . Deep venous thrombosis     hx of  . Osteoporosis   . Renal failure   . Heart murmur   . Depression   . Chronic kidney disease   . Kidney stone   . Prostate cancer     AND BLADDER CANCER - S/P URETEROILEAL CONDUIT  . 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 Cristopher Peru -ELECTROPHYSIOLOGIST    Past Surgical History:  Past Surgical History  Procedure Laterality Date  . Insert / replace / remove pacemaker    . Cystectomy    . Lymphadenectomy    . Prostate surgery    . Appendectomy    . Cataract extraction, bilateral    . Kidney stone surgery    . Ilial conduit      for bladder cancer  . Nephrolithotomy  09/02/2011    Procedure: NEPHROLITHOTOMY PERCUTANEOUS;  Surgeon: Malka So, MD;  Location: [facility]S;  Service: Urology;  Laterality: Left;    Family History:  Family History  Problem Relation Age of Onset  . Prostate cancer Father 69    died  . Pancreatic cancer Mother 81    died    Social History:  reports that he quit smoking  about 61 years ago. His smoking use included Cigarettes. He smoked 0.00 packs per day. He has never used smokeless tobacco. He reports that he does not drink alcohol or use illicit drugs.  Allergies: No Known Allergies  Medications:   Medication List    ASK your doctor about these medications       amLODipine 2.5 MG tablet  Commonly known as:  NORVASC  Take 2.5 mg by mouth daily.     aspirin 81 MG tablet  Take 1 tablet (81 mg total) by mouth daily.     cephALEXin 250 MG capsule  Commonly known as:  KEFLEX  Take 1 capsule (250 mg total) by mouth 3 (three) times daily.     docusate sodium 100 MG capsule  Commonly known as:  COLACE  Take 100 mg by mouth 2 (two) times daily.     HYDROcodone-acetaminophen 5-325 MG per tablet  Commonly known as:  NORCO/VICODIN  Take 1 tablet by mouth every 6 (six) hours as needed for moderate pain.     OVER THE COUNTER MEDICATION  Take 2 tablets by mouth daily. Takes ARES 2 (eye vitamin)     vitamin C 500 MG tablet  Commonly known as:  ASCORBIC ACID  Take 500 mg by mouth daily.     VITAMIN D-3 PO  Take by mouth. 1000 iu  daily     vitamin E 400 UNIT capsule  Take 400 Units by mouth daily.        Please HPI for pertinent positives, otherwise complete 10 system ROS negative.  Physical Exam: BP 172/80  Pulse 75  Temp(Src) 98.1 F (36.7 C) (Oral)  Resp 18  SpO2 98% There is no weight on file to calculate BMI.  General Appearance:  Alert, cooperative, no distress  Head:  Normocephalic, without obvious abnormality, atraumatic  Neck: Supple, symmetrical, trachea midline  Lungs:   Clear to auscultation bilaterally, no w/r/r, respirations unlabored without use of accessory muscles.  Chest Wall:  No tenderness or deformity  Heart:  Regular rate and rhythm, S1, S2 normal, no murmur, rub or gallop.  Abdomen:   Soft, non-tender, non distended, (+) BS, urostomy intact with clear yellow urine, right PCN intact, slight tenderness at site, clear  yellow urine, no evidence of bleeding or skin infection.   Extremities: Extremities normal, atraumatic, no cyanosis or edema  Neurologic: Normal affect, no gross deficits.   Results for orders placed during the hospital encounter of 07/20/13 (from the past 48 hour(s))  BASIC METABOLIC PANEL     Status: Abnormal   Collection Time    07/20/13  7:46 AM      Result Value Ref Range   Sodium 142  137 - 147 mEq/L   Potassium 4.7  3.7 - 5.3 mEq/L   Chloride 104  96 - 112 mEq/L   CO2 25  19 - 32 mEq/L   Glucose, Bld 90  70 - 99 mg/dL   BUN 24 (*) 6 - 23 mg/dL   Creatinine, Ser 2.06 (*) 0.50 - 1.35 mg/dL   Calcium 9.2  8.4 - 01.5 mg/dL   GFR calc non Af Amer 35 (*) >90 mL/min   GFR calc Af Amer 41 (*) >90 mL/min   Comment: (NOTE)     The eGFR has been calculated using the CKD EPI equation.     This calculation has not been validated in all clinical situations.     eGFR's persistently <90 mL/min signify possible Chronic Kidney     Disease.  CBC WITH DIFFERENTIAL     Status: Abnormal   Collection Time    07/20/13  7:46 AM      Result Value Ref Range   WBC 9.0  4.0 - 10.5 K/uL   RBC 4.06 (*) 4.22 - 5.81 MIL/uL   Hemoglobin 12.7 (*) 13.0 - 17.0 g/dL   HCT 61.5 (*) 37.9 - 43.2 %   MCV 94.6  78.0 - 100.0 fL   MCH 31.3  26.0 - 34.0 pg   MCHC 33.1  30.0 - 36.0 g/dL   RDW 76.1  47.0 - 92.9 %   Platelets 269  150 - 400 K/uL   Neutrophils Relative % 57  43 - 77 %   Neutro Abs 5.1  1.7 - 7.7 K/uL   Lymphocytes Relative 26  12 - 46 %   Lymphs Abs 2.4  0.7 - 4.0 K/uL   Monocytes Relative 7  3 - 12 %   Monocytes Absolute 0.7  0.1 - 1.0 K/uL   Eosinophils Relative 9 (*) 0 - 5 %   Eosinophils Absolute 0.8 (*) 0.0 - 0.7 K/uL   Basophils Relative 0  0 - 1 %   Basophils Absolute 0.0  0.0 - 0.1 K/uL  PROTIME-INR     Status: None   Collection Time    07/20/13  7:46 AM  Result Value Ref Range   Prothrombin Time 11.8  11.6 - 15.2 seconds   INR 0.88  0.00 - 1.49   No results  found.  Assessment/Plan History of bladder cancer s/p complete cystectomy, ureteroileal conduit and lymphadenectomy. Nephrolithiasis right hydronephrosis s/p right PCN 07/14/13, Cx revealed E. Coli, Proteus Mirabilis Scheduled today for image guided conversion of right nephrostomy tube to nephroureteral catheter. Patient has been NPO, afebrile on keflex, labs and images reviewed. Risks and Benefits discussed with the patient. All of the patient's questions were answered, patient is agreeable to proceed. Consent signed and in chart.    Hedy Jacob PA-C 07/20/2013, 8:40 AM

## 2013-07-20 NOTE — Discharge Instructions (Signed)
Percutaneous Nephrostomy, Care After Refer to this sheet in the next few weeks. These instructions provide you with information on caring for yourself after your procedure. Your health care provider may also give you more specific instructions. Your treatment has been planned according to current medical practices, but problems sometimes occur. Call your health care provider if you have any problems or questions after your procedure. WHAT TO EXPECT AFTER THE PROCEDURE  You will need to remain lying down for several hours. Some people are able to leave the hospital the same day, others stay overnight. HOME CARE INSTRUCTIONS  Your nephrostomy tube is connected to a leg bag or bedside drainage bag. Always keep the tubing, the leg bag, or the bedside drainage bags below the level of the kidney so that the urine drains freely.  During the day, if you are connecting the nephrostomy tube to a leg bag, be sure there are no kinks in the tubing and that the urine is draining freely.  At night, you may want to connect the nephrostomy tube or the leg bag to a larger bedside drainage bag.  Change the dressing as often as directed by your health care provider or if it becomes wet.  Gently remove the tapes and dressing from around the nephrostomy tube. Be careful not to pull on the tube while removing the dressing.  Wash the skin around the tube, rinse well and dry.  Place two split drain sponges in and around the tube exit site.  Place tape around edge of the dressing.  Secure the nephrostomy tubing. Remember to make certain that the nephrostomy tube does not kink or become pinched closed. It can be useful to wrap any exposed tubing going from the nephrostomy tube to any of the connecting tubes to either the leg bag or drainage bag with an elastic bandage.  Every three weeks, replace the leg bag, drainage bag, and any extension tubing connected to your nephrostomy tube. Your health care provider will  explain how to change the drainage bag and extension tubing. SEEK MEDICAL CARE IF:  You experience any problems with any of the valves or tubing.  You have persistent pain or soreness in your back. SEEK IMMEDIATE MEDICAL CARE IF:  You have a fever or chills.  You have abdominal pain during the first week.  You have a new appearance of blood in urine.  You have back pain that is not relieved by your medicine.  You have redness, swelling, or drainage at the tube insertion site.  You have decreased urine output.  Your nephrostomy tube comes out. Document Released: 11/02/2003 Document Revised: 12/30/2012 Document Reviewed: 11/05/2012 [facility] Patient Information 2014 [place], [place]. Moderate Sedation, Adult Moderate sedation is given to help you relax or even sleep through a procedure. You may remain sleepy, be clumsy, or have poor balance for several hours following this procedure. Arrange for a responsible adult, family member, or friend to take you home. A responsible adult should stay with you for at least 24 hours or until the medicines have worn off.  Do not participate in any activities where you could become injured for the next 24 hours, or until you feel normal again. Do not:  Drive.  Swim.  Ride a bicycle.  Operate heavy machinery.  Cook.  Use power tools.  Climb ladders.  Work at [employer].  Do not make important decisions or sign legal documents until you are improved.  Vomiting may occur if you eat too soon. When you can  drink without vomiting, try water, juice, or soup. Try solid foods if you feel little or no nausea.  Only take over-the-counter or prescription medications for pain, discomfort, or fever as directed by your caregiver.If pain medications have been prescribed for you, ask your caregiver how soon it is safe to take them.  Make sure you and your family fully understands everything about the medication given to you. Make sure you understand what  side effects may occur.  You should not drink alcohol, take sleeping pills, or medications that cause drowsiness for at least 24 hours.  If you smoke, do not smoke alone.  If you are feeling better, you may resume normal activities 24 hours after receiving sedation.  Keep all appointments as scheduled. Follow all instructions.  Ask questions if you do not understand. SEEK MEDICAL CARE IF:   Your skin is pale or bluish in color.  You continue to feel sick to your stomach (nauseous) or throw up (vomit).  Your pain is getting worse and not helped by medication.  You have bleeding or swelling.  You are still sleepy or feeling clumsy after 24 hours. SEEK IMMEDIATE MEDICAL CARE IF:   You develop a rash.  You have difficulty breathing.  You develop any type of allergic problem.  You have a fever. Document Released: 12/04/2000 Document Revised: 06/03/2011 Document Reviewed: 11/16/2012 [facility] Patient Information 2014 [place].

## 2013-07-20 NOTE — Progress Notes (Signed)
Scab to lt ac area noted with redness and swollen.  Pt daughter states the area occurred during his hospital stay last week.  No drainage noted but advised pt and his daughter to have the area assessed by his primary care provider.  Daughter voiced understanding and then called Dr.Perrinni's office to have the pt seen.

## 2013-07-20 NOTE — Progress Notes (Signed)
Need orders in EPIC.  Surgery on 07/27/2013.  Preop on 07/21/13 at 1130am.  Thank You.

## 2013-07-20 NOTE — Procedures (Signed)
Successful conversion of right sided PCN to [place] with superior coil in the right renal pelvis and inf coil in ileal loop.  No immediate complications.

## 2013-07-21 ENCOUNTER — Other Ambulatory Visit: Payer: Self-pay | Admitting: Urology

## 2013-07-21 ENCOUNTER — Ambulatory Visit (HOSPITAL_COMMUNITY)
Admission: RE | Admit: 2013-07-21 | Discharge: 2013-07-21 | Disposition: A | Discharge status: Home / Self Care | Payer: Medicare Other | Source: Ambulatory Visit | Attending: Urology | Admitting: Urology

## 2013-07-21 ENCOUNTER — Encounter (HOSPITAL_COMMUNITY)
Admission: RE | Admit: 2013-07-21 | Discharge: 2013-07-21 | Disposition: A | Discharge status: Home / Self Care | Payer: Medicare Other | Source: Ambulatory Visit | Attending: Urology | Admitting: Urology

## 2013-07-21 ENCOUNTER — Emergency Department (HOSPITAL_COMMUNITY)
Admission: EM | Admit: 2013-07-21 | Discharge: 2013-07-21 | Disposition: A | Discharge status: Home / Self Care | Payer: Medicare Other | Attending: Emergency Medicine | Admitting: Emergency Medicine

## 2013-07-21 ENCOUNTER — Encounter (HOSPITAL_COMMUNITY): Payer: Self-pay

## 2013-07-21 ENCOUNTER — Encounter (HOSPITAL_COMMUNITY): Payer: Self-pay | Admitting: Emergency Medicine

## 2013-07-21 ENCOUNTER — Encounter (HOSPITAL_COMMUNITY): Payer: Self-pay | Admitting: Pharmacy Technician

## 2013-07-21 DIAGNOSIS — Z8739 Personal history of other diseases of the musculoskeletal system and connective tissue: Secondary | ICD-10-CM | POA: Insufficient documentation

## 2013-07-21 DIAGNOSIS — Z87442 Personal history of urinary calculi: Secondary | ICD-10-CM | POA: Insufficient documentation

## 2013-07-21 DIAGNOSIS — Y833 Surgical operation with formation of external stoma as the cause of abnormal reaction of the patient, or of later complication, without mention of misadventure at the time of the procedure: Secondary | ICD-10-CM | POA: Insufficient documentation

## 2013-07-21 DIAGNOSIS — Z8669 Personal history of other diseases of the nervous system and sense organs: Secondary | ICD-10-CM | POA: Insufficient documentation

## 2013-07-21 DIAGNOSIS — Z8639 Personal history of other endocrine, nutritional and metabolic disease: Secondary | ICD-10-CM | POA: Insufficient documentation

## 2013-07-21 DIAGNOSIS — Z792 Long term (current) use of antibiotics: Secondary | ICD-10-CM | POA: Insufficient documentation

## 2013-07-21 DIAGNOSIS — I129 Hypertensive chronic kidney disease with stage 1 through stage 4 chronic kidney disease, or unspecified chronic kidney disease: Secondary | ICD-10-CM | POA: Insufficient documentation

## 2013-07-21 DIAGNOSIS — Z01812 Encounter for preprocedural laboratory examination: Secondary | ICD-10-CM | POA: Insufficient documentation

## 2013-07-21 DIAGNOSIS — Z87891 Personal history of nicotine dependence: Secondary | ICD-10-CM | POA: Insufficient documentation

## 2013-07-21 DIAGNOSIS — Z01818 Encounter for other preprocedural examination: Secondary | ICD-10-CM | POA: Insufficient documentation

## 2013-07-21 DIAGNOSIS — N189 Chronic kidney disease, unspecified: Secondary | ICD-10-CM | POA: Insufficient documentation

## 2013-07-21 DIAGNOSIS — Z7982 Long term (current) use of aspirin: Secondary | ICD-10-CM | POA: Insufficient documentation

## 2013-07-21 DIAGNOSIS — R011 Cardiac murmur, unspecified: Secondary | ICD-10-CM | POA: Insufficient documentation

## 2013-07-21 DIAGNOSIS — N99528 Other complication of other external stoma of urinary tract: Secondary | ICD-10-CM

## 2013-07-21 DIAGNOSIS — T8389XA Other specified complication of genitourinary prosthetic devices, implants and grafts, initial encounter: Secondary | ICD-10-CM | POA: Insufficient documentation

## 2013-07-21 DIAGNOSIS — Z950 Presence of cardiac pacemaker: Secondary | ICD-10-CM | POA: Insufficient documentation

## 2013-07-21 DIAGNOSIS — I10 Essential (primary) hypertension: Secondary | ICD-10-CM | POA: Insufficient documentation

## 2013-07-21 DIAGNOSIS — Z8546 Personal history of malignant neoplasm of prostate: Secondary | ICD-10-CM | POA: Insufficient documentation

## 2013-07-21 DIAGNOSIS — Z8659 Personal history of other mental and behavioral disorders: Secondary | ICD-10-CM | POA: Insufficient documentation

## 2013-07-21 DIAGNOSIS — Z8551 Personal history of malignant neoplasm of bladder: Secondary | ICD-10-CM | POA: Insufficient documentation

## 2013-07-21 DIAGNOSIS — Z79899 Other long term (current) drug therapy: Secondary | ICD-10-CM | POA: Insufficient documentation

## 2013-07-21 DIAGNOSIS — Z86718 Personal history of other venous thrombosis and embolism: Secondary | ICD-10-CM | POA: Insufficient documentation

## 2013-07-21 DIAGNOSIS — Z862 Personal history of diseases of the blood and blood-forming organs and certain disorders involving the immune mechanism: Secondary | ICD-10-CM | POA: Insufficient documentation

## 2013-07-21 HISTORY — DX: Bradycardia, unspecified: R00.1

## 2013-07-21 HISTORY — DX: Other artificial openings of urinary tract status: Z93.6

## 2013-07-21 HISTORY — DX: Unspecified macular degeneration: H35.30

## 2013-07-21 NOTE — Progress Notes (Signed)
07/21/13 1130  OBSTRUCTIVE SLEEP APNEA  Have you ever been diagnosed with sleep apnea through a sleep study? No  Do you snore loudly (loud enough to be heard through closed doors)?  1  Do you often feel tired, fatigued, or sleepy during the daytime? 1  Has anyone observed you stop breathing during your sleep? 0  Do you have, or are you being treated for high blood pressure? 1  BMI more than 35 kg/m2? 0  Age over 78 years old? 1  Neck circumference greater than 40 cm/16 inches? 1  Gender: 1  Obstructive Sleep Apnea Score 6  Score 4 or greater  Results sent to PCP

## 2013-07-21 NOTE — Patient Instructions (Signed)
YOUR SURGERY IS SCHEDULED AT [facility]  ON:  Tuesday  5/5  REPORT TO  SHORT STAY CENTER AT:  9:00 AM      PHONE # FOR SHORT STAY IS [phone]  DO NOT EAT OR DRINK ANYTHING AFTER MIDNIGHT THE NIGHT BEFORE YOUR SURGERY.  YOU MAY BRUSH YOUR TEETH, RINSE OUT YOUR MOUTH--BUT NO WATER, NO FOOD, NO CHEWING GUM, NO MINTS, NO CANDIES, NO CHEWING TOBACCO.  PLEASE TAKE THE FOLLOWING MEDICATIONS THE AM OF YOUR SURGERY WITH A FEW SIPS OF WATER:  AMLODIPINE, HYDROCODONE / ACETAMINOPHEN  DO NOT BRING VALUABLES, MONEY, CREDIT CARDS.  DO NOT WEAR JEWELRY, MAKE-UP, NAIL POLISH AND NO METAL PINS OR CLIPS IN YOUR HAIR. CONTACT LENS, DENTURES / PARTIALS, GLASSES SHOULD NOT BE WORN TO SURGERY AND IN MOST CASES-HEARING AIDS WILL NEED TO BE REMOVED.  BRING YOUR GLASSES CASE, ANY EQUIPMENT NEEDED FOR YOUR CONTACT LENS. FOR PATIENTS ADMITTED TO THE HOSPITAL--CHECK OUT TIME THE DAY OF DISCHARGE IS 11:00 AM.  ALL INPATIENT ROOMS ARE PRIVATE - WITH BATHROOM, TELEPHONE, TELEVISION AND WIFI INTERNET.                                               PLEASE READ OVER ANY  FACT SHEETS THAT YOU WERE GIVEN: MRSA INFORMATION, BLOOD TRANSFUSION INFORMATION, INCENTIVE SPIROMETER INFORMATION.  PLEASE BE AWARE THAT YOU MAY NEED ADDITIONAL BLOOD DRAWN DAY OF YOUR SURGERY  _______________________________________________________________________   [facility] - Preparing for Surgery Before surgery, you can play an important role.  Because skin is not sterile, your skin needs to be as free of germs as possible.  You can reduce the number of germs on your skin by washing with CHG (chlorahexidine gluconate) soap before surgery.  CHG is an antiseptic cleaner which kills germs and bonds with the skin to continue killing germs even after washing. Please DO NOT use if you have an allergy to CHG or antibacterial soaps.  If your skin becomes reddened/irritated stop using the CHG and inform your nurse when you arrive at Short  Stay. Do not shave (including legs and underarms) for at least 48 hours prior to the first CHG shower.  You may shave your face. Please follow these instructions carefully:  1.  Shower with CHG Soap the night before surgery and the  morning of Surgery.  2.  If you choose to wash your hair, wash your hair first as usual with your  normal  shampoo.  3.  After you shampoo, rinse your hair and body thoroughly to remove the  shampoo.                           4.  Use CHG as you would any other liquid soap.  You can apply chg directly  to the skin and wash                       Gently with a scrungie or clean washcloth.  5.  Apply the CHG Soap to your body ONLY FROM THE NECK DOWN.   Do not use on open                           Wound or open sores. Avoid contact with eyes, ears mouth and genitals (  private parts).                        Genitals (private parts) with your normal soap.             6.  Wash thoroughly, paying special attention to the area where your surgery  will be performed.  7.  Thoroughly rinse your body with warm water from the neck down.  8.  DO NOT shower/wash with your normal soap after using and rinsing off  the CHG Soap.                9.  Pat yourself dry with a clean towel.            10.  Wear clean pajamas.            11.  Place clean sheets on your bed the night of your first shower and do not  sleep with pets. Day of Surgery : Do not apply any lotions/deodorants the morning of surgery.  Please wear clean clothes to the hospital/surgery center.  FAILURE TO FOLLOW THESE INSTRUCTIONS MAY RESULT IN THE CANCELLATION OF YOUR SURGERY PATIENT SIGNATURE_________________________________  NURSE SIGNATURE__________________________________  ________________________________________________________________________

## 2013-07-21 NOTE — Pre-Procedure Instructions (Signed)
BMET, CBC, DIFF, PT, INR WERE DONE YESTERDAY  4/28 - REPORTS IN EPIC T/S, EKG AND CXR WERE DONE TODAY AT [facility] PREOP  PACEMAKER ORDERS HAVE BEEN REQUESTED.

## 2013-07-21 NOTE — Discharge Instructions (Signed)
Followup with your doctor as needed

## 2013-07-21 NOTE — ED Provider Notes (Signed)
CSN: [id number]     Arrival date & time 07/21/13  1834 History   First MD Initiated Contact with Patient 07/21/13 1936     Chief Complaint  Patient presents with  . Post-op Problem     (Consider location/radiation/quality/duration/timing/severity/associated sxs/prior Treatment) The history is provided by the patient.   patient here complaining of leakage from one of his nephrostomy tubes. The leakage is at the connection of the tube and bag--no drainage at the skin site--problem started today, no tx used, nothing makes the problem better  Past Medical History  Diagnosis Date  . Hypertension   . Aortic valve stenosis   . Gout   . Hx of bladder cancer   . Deep venous thrombosis     hx of  LEFT ARM AFTER PACEMAKER INSERTION ABOUT 6 YRS AGO  . Osteoporosis   . Renal failure   . Heart murmur   . Depression   . Chronic kidney disease   . Kidney stone   . Prostate cancer     AND BLADDER CANCER - S/P URETEROILEAL CONDUIT  . Bradycardia     HX OF SYMPTOMATIC BRADYCARDIA - STATUS POST PERMANENT PACEMAKER  . S/P ileal conduit     HX BLADDER AND PROSTATE CANCER  . Macular degeneration     PT STATES HIS VISION STILL OKAY FOR DRIVING  . 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 GREGG TAYLOR -ELECTROPHYSIOLOGIST  . Hydronephrosis with obstructing calculus     RIGHT SIDE - HOSPITALIZED 07-14-13 OT 07-17-13   Past Surgical History  Procedure Laterality Date  . Insert / replace / remove pacemaker    . Cystectomy    . Lymphadenectomy    . Prostate surgery    . Appendectomy    . Cataract extraction, bilateral    . Kidney stone surgery    . Ilial conduit      for bladder cancer  . Nephrolithotomy  09/02/2011    Procedure: NEPHROLITHOTOMY PERCUTANEOUS;  Surgeon: Malka So, MD;  Location: [facility];  Service: Urology;  Laterality: Left;  . 07/20/13  conversion of right sided nephrostomy catheter to right sided nephro ureteral catheter - done in interventional radiology      Family History  Problem Relation Age of Onset  . Prostate cancer Father 79    died  . Pancreatic cancer Mother 54    died   History  Substance Use Topics  . Smoking status: Former Smoker    Types: Cigarettes    Quit date: 03/25/1952  . Smokeless tobacco: Never Used  . Alcohol Use: No    Review of Systems  All other systems reviewed and are negative.     Allergies  Review of patient's allergies indicates no known allergies.  Home Medications   Prior to Admission medications   Medication Sig Start Date End Date Taking? Authorizing Provider  acidophilus (RISAQUAD) CAPS capsule Take 1 capsule by mouth daily.    Historical Provider, MD  amLODipine (NORVASC) 2.5 MG tablet Take 2.5 mg by mouth every morning.  09/14/12   Historical Provider, MD  aspirin 81 MG tablet Take 1 tablet (81 mg total) by mouth daily. 09/08/11   Fredricka Bonine, MD  cephALEXin (KEFLEX) 250 MG capsule Take 1 capsule (250 mg total) by mouth 3 (three) times daily. 07/16/13   Irine Seal, MD  cholecalciferol (VITAMIN D) 1000 UNITS tablet Take 1,000 Units by mouth daily.    Historical Provider, MD  docusate sodium (COLACE) 100 MG capsule  Take 100 mg by mouth daily as needed for mild constipation.     Historical Provider, MD  HYDROcodone-acetaminophen (NORCO/VICODIN) 5-325 MG per tablet Take 1 tablet by mouth every 6 (six) hours as needed for moderate pain. 07/16/13   Irine Seal, MD  Multiple Vitamins-Minerals (PRESERVISION AREDS 2 PO) Take 1 tablet by mouth 2 (two) times daily.    Historical Provider, MD  mupirocin ointment (BACTROBAN) 2 % Place 1 application into the nose 2 (two) times daily.    Historical Provider, MD  vitamin C (ASCORBIC ACID) 500 MG tablet Take 500 mg by mouth daily.    Historical Provider, MD  vitamin E 400 UNIT capsule Take 400 Units by mouth daily.    Historical Provider, MD   BP 136/72  Pulse 76  Temp(Src) 98.4 F (36.9 C) (Oral)  Resp 20  SpO2 100% Physical Exam  Nursing  note and vitals reviewed. Constitutional: He is oriented to person, place, and time. He appears well-developed and well-nourished.  Non-toxic appearance.  HENT:  Head: Normocephalic and atraumatic.  Eyes: Conjunctivae are normal. Pupils are equal, round, and reactive to light.  Neck: Normal range of motion.  Cardiovascular: Normal rate.   Pulmonary/Chest: Effort normal.  Genitourinary:     Neurological: He is alert and oriented to person, place, and time.  Skin: Skin is warm and dry.  Psychiatric: He has a normal mood and affect.    ED Course  Procedures (including critical care time) Labs Review Labs Reviewed - No data to display  Imaging Review Dg Chest 2 View  07/21/2013   CLINICAL DATA:  PREOP RIGHT PERCUTANEOUS NEPHROLITHOTOMY PACEMAKER -HX SYMPTOMATIC BRADYCARDIA; HYPERTENSION  EXAM: CHEST  2 VIEW  COMPARISON:  DG CHEST 1 VIEW dated 08/16/2011  FINDINGS: The heart size and mediastinal contours are within normal limits. Both lungs are clear. The visualized skeletal structures are unremarkable. A left chest wall cardiac pacing unit.  IMPRESSION: No active cardiopulmonary disease.   Electronically Signed   By: Margaree Mackintosh M.D.   On: 07/21/2013 15:15   Ir Nephrostogram Right  07/20/2013   CLINICAL DATA:  History of bladder cancer, post cystectomy and ileal conduit creation, recently admitted with right-sided obstructive uropathy, post ultrasound fluoroscopic guided percutaneous nephrostomy tube placement (07/14/2013), now presenting for conversion of nephrostomy catheter to a nephro ureteral catheter to maintain percutaneous access for potential future percutaneous nephrolithotomy.  EXAM: 1. RIGHT-SIDED NEPHROSTOGRAM. 2. FLUOROSCOPIC GUIDED CONVERSION OF RIGHT-SIDED NEPHROSTOMY CATHETER TO RIGHT-SIDED NEPHRO URETERAL CATHETER.  COMPARISON:  IR US GUIDE BX ASP/DRAIN dated 07/14/2013  MEDICATIONS: Versed 1.5 mg IV; Fentanyl 50 mcg IV; Rocephin 1 g IV; The antibiotics were administered  within 1 hour of the procedure.  ANESTHESIA/SEDATION: Total Moderate Sedation Time  15 minutes.  CONTRAST:  25 cc Omnipaque 300, administered into the right-sided renal collecting system.  FLUOROSCOPY TIME:  3 minutes.  30 seconds.  COMPLICATIONS: None immediate  PROCEDURE: The procedure, risks, benefits, and alternatives were explained to the patient. Questions regarding the procedure were encouraged and answered. The patient understands and consents to the procedure.  The nephrostomy tubes and surrounding skin were prepped with Betadine in a sterile fashion, and a sterile drape was applied covering the operative field. A sterile gown and sterile gloves were used for the procedure. Local anesthesia was provided with 1% Lidocaine.  The preexisting right-sided nephrostomy tube was injected with contrast material. Fluoroscopic spot images were obtained of the collecting system and ureter to the level of the ileal conduit. The existing  nephrostomy catheter was cut and cannulated with a Benson wire. Under intermittent fluoroscopic guidance, the existing nephrostomy catheter was exchanged for a 9-French vascular sheath which was advanced into the renal pelvis. With the use of a Bentson wire, a Kumpe catheter was advanced through the right ureter and into the right lower quadrant ileal conduit. Contrast injection confirmed appropriate positioning.  The Kumpe catheter was utilized to estimate to appropriate length of the nephro ureteral catheter. Over an Amplatz stiff glide wire and under intermittent fluoroscopic guidance, a 10 French, 22 cm nephro ureteral catheter was then advanced with distal portion formed at the level of the ileal conduit and portion was then formed at the level of the renal collecting system. Note, 22 cm is the shortest nephro ureteral catheter length available. Limited contrast injection confirmed appropriate positioning.  The nephro ureteral catheter was reconnected to a gravity bag (at the  patient's family request). The catheter was secured at the skin exit site within interrupted suture. Dressings were placed. The patient tolerated procedure well without immediate postprocedural complication.  FINDINGS: Right-sided antegrade nephrostogram demonstrates the existing right-sided percutaneous nephrostomy catheter is appropriately positioned and functioning. There has been interval resolution of previously noted moderate right-sided pelvicaliectasis.  There is mild irregularity and narrowing involving the distal aspect of the right ureter, however contrast passes through the distal ureter and into the ileal conduit. There is moderate upstream ureterectasis.  Successful fluoroscopic conversion of right-sided percutaneous nephrostomy to a new 10 French, 22 cm nephro ureteral catheter with inferior coil within the ileal conduit and superior coil within the right renal pelvis.  IMPRESSION: 1. Successful conversion of right-sided percutaneous nephrostomy catheter to a 10 French, 22 cm nephro ureteral catheter with inferior coil within the ileal conduit and superior coil within the renal pelvis. 2. Nonocclusive mild irregular narrowing of the distal aspect of the right ureter regional to the anastomosis, possibly secondary to anastomotic stricture though further evaluation could be performed with direct visualization as clinically indicated.  PLAN: The nephro ureteral catheter was reconnected to a gravity bag at the patient's family request secondary to impending nephrolithotomy procedure scheduled for early next week.   Electronically Signed   By: Sandi Mariscal M.D.   On: 07/20/2013 10:55   Ir Oris Drone Cath Perc Right  07/20/2013   CLINICAL DATA:  History of bladder cancer, post cystectomy and ileal conduit creation, recently admitted with right-sided obstructive uropathy, post ultrasound fluoroscopic guided percutaneous nephrostomy tube placement (07/14/2013), now presenting for conversion of nephrostomy  catheter to a nephro ureteral catheter to maintain percutaneous access for potential future percutaneous nephrolithotomy.  EXAM: 1. RIGHT-SIDED NEPHROSTOGRAM. 2. FLUOROSCOPIC GUIDED CONVERSION OF RIGHT-SIDED NEPHROSTOMY CATHETER TO RIGHT-SIDED NEPHRO URETERAL CATHETER.  COMPARISON:  IR US GUIDE BX ASP/DRAIN dated 07/14/2013  MEDICATIONS: Versed 1.5 mg IV; Fentanyl 50 mcg IV; Rocephin 1 g IV; The antibiotics were administered within 1 hour of the procedure.  ANESTHESIA/SEDATION: Total Moderate Sedation Time  15 minutes.  CONTRAST:  25 cc Omnipaque 300, administered into the right-sided renal collecting system.  FLUOROSCOPY TIME:  3 minutes.  30 seconds.  COMPLICATIONS: None immediate  PROCEDURE: The procedure, risks, benefits, and alternatives were explained to the patient. Questions regarding the procedure were encouraged and answered. The patient understands and consents to the procedure.  The nephrostomy tubes and surrounding skin were prepped with Betadine in a sterile fashion, and a sterile drape was applied covering the operative field. A sterile gown and sterile gloves were used for the procedure. Local  anesthesia was provided with 1% Lidocaine.  The preexisting right-sided nephrostomy tube was injected with contrast material. Fluoroscopic spot images were obtained of the collecting system and ureter to the level of the ileal conduit. The existing nephrostomy catheter was cut and cannulated with a Benson wire. Under intermittent fluoroscopic guidance, the existing nephrostomy catheter was exchanged for a 9-French vascular sheath which was advanced into the renal pelvis. With the use of a Bentson wire, a Kumpe catheter was advanced through the right ureter and into the right lower quadrant ileal conduit. Contrast injection confirmed appropriate positioning.  The Kumpe catheter was utilized to estimate to appropriate length of the nephro ureteral catheter. Over an Amplatz stiff glide wire and under intermittent  fluoroscopic guidance, a 10 French, 22 cm nephro ureteral catheter was then advanced with distal portion formed at the level of the ileal conduit and portion was then formed at the level of the renal collecting system. Note, 22 cm is the shortest nephro ureteral catheter length available. Limited contrast injection confirmed appropriate positioning.  The nephro ureteral catheter was reconnected to a gravity bag (at the patient's family request). The catheter was secured at the skin exit site within interrupted suture. Dressings were placed. The patient tolerated procedure well without immediate postprocedural complication.  FINDINGS: Right-sided antegrade nephrostogram demonstrates the existing right-sided percutaneous nephrostomy catheter is appropriately positioned and functioning. There has been interval resolution of previously noted moderate right-sided pelvicaliectasis.  There is mild irregularity and narrowing involving the distal aspect of the right ureter, however contrast passes through the distal ureter and into the ileal conduit. There is moderate upstream ureterectasis.  Successful fluoroscopic conversion of right-sided percutaneous nephrostomy to a new 10 French, 22 cm nephro ureteral catheter with inferior coil within the ileal conduit and superior coil within the right renal pelvis.  IMPRESSION: 1. Successful conversion of right-sided percutaneous nephrostomy catheter to a 10 French, 22 cm nephro ureteral catheter with inferior coil within the ileal conduit and superior coil within the renal pelvis. 2. Nonocclusive mild irregular narrowing of the distal aspect of the right ureter regional to the anastomosis, possibly secondary to anastomotic stricture though further evaluation could be performed with direct visualization as clinically indicated.  PLAN: The nephro ureteral catheter was reconnected to a gravity bag at the patient's family request secondary to impending nephrolithotomy procedure  scheduled for early next week.   Electronically Signed   By: Sandi Mariscal M.D.   On: 07/20/2013 10:55     EKG Interpretation None      MDM   Final diagnoses:  None    Nephrostomy tube that replaced by nursing. Stable for discharge    Leota Jacobsen, MD 07/21/13 2001

## 2013-07-21 NOTE — Progress Notes (Signed)
07/21/13 1130  OBSTRUCTIVE SLEEP APNEA  Have you ever been diagnosed with sleep apnea through a sleep study? No  Do you snore loudly (loud enough to be heard through closed doors)?  1  Do you often feel tired, fatigued, or sleepy during the daytime? 1  Has anyone observed you stop breathing during your sleep? 0  Do you have, or are you being treated for high blood pressure? 1  BMI more than 35 kg/m2? 0  Age over 79 years old? 1  Neck circumference greater than 40 cm/16 inches? 1  Gender: 1  Obstructive Sleep Apnea Score 6  Score 4 or greater  Results sent to PCP

## 2013-07-21 NOTE — ED Notes (Signed)
Pt has 2 nephrostomy tubes.  Pt family states tube is leaking.  Leaking is not at site but on top of bag. No other pain or complication.  Just wanting bag changed/exchanged to stop leak.

## 2013-07-23 NOTE — Pre-Procedure Instructions (Signed)
PACEMAKER ORDERS RECEIVED AND ON CHART - MAGNET SHOULD BE USED DURING PROCEDURE - NO POST OP INTERROGATION IS NEEDED.

## 2013-07-27 ENCOUNTER — Encounter (HOSPITAL_COMMUNITY): Payer: Medicare Other | Admitting: Anesthesiology

## 2013-07-27 ENCOUNTER — Ambulatory Visit (HOSPITAL_COMMUNITY): Payer: Medicare Other | Admitting: Anesthesiology

## 2013-07-27 ENCOUNTER — Observation Stay (HOSPITAL_COMMUNITY): Payer: Medicare Other

## 2013-07-27 ENCOUNTER — Encounter (HOSPITAL_COMMUNITY)
Admission: RE | Disposition: A | Discharge status: Home Health | Payer: Self-pay | Source: Ambulatory Visit | Attending: Urology

## 2013-07-27 ENCOUNTER — Inpatient Hospital Stay (HOSPITAL_COMMUNITY)
Admission: RE | Admit: 2013-07-27 | Discharge: 2013-08-04 | DRG: 660 | Disposition: A | Discharge status: Home Health | Payer: Medicare Other | Source: Ambulatory Visit | Attending: Urology | Admitting: Urology

## 2013-07-27 ENCOUNTER — Ambulatory Visit (HOSPITAL_COMMUNITY): Payer: Medicare Other

## 2013-07-27 ENCOUNTER — Encounter (HOSPITAL_COMMUNITY): Payer: Self-pay | Admitting: *Deleted

## 2013-07-27 DIAGNOSIS — Z8042 Family history of malignant neoplasm of prostate: Secondary | ICD-10-CM

## 2013-07-27 DIAGNOSIS — Z8546 Personal history of malignant neoplasm of prostate: Secondary | ICD-10-CM

## 2013-07-27 DIAGNOSIS — Z7982 Long term (current) use of aspirin: Secondary | ICD-10-CM

## 2013-07-27 DIAGNOSIS — Z87442 Personal history of urinary calculi: Secondary | ICD-10-CM

## 2013-07-27 DIAGNOSIS — IMO0002 Reserved for concepts with insufficient information to code with codable children: Secondary | ICD-10-CM | POA: Diagnosis not present

## 2013-07-27 DIAGNOSIS — N1330 Unspecified hydronephrosis: Secondary | ICD-10-CM | POA: Diagnosis present

## 2013-07-27 DIAGNOSIS — I350 Nonrheumatic aortic (valve) stenosis: Secondary | ICD-10-CM | POA: Diagnosis present

## 2013-07-27 DIAGNOSIS — Z906 Acquired absence of other parts of urinary tract: Secondary | ICD-10-CM

## 2013-07-27 DIAGNOSIS — I359 Nonrheumatic aortic valve disorder, unspecified: Secondary | ICD-10-CM | POA: Diagnosis present

## 2013-07-27 DIAGNOSIS — D638 Anemia in other chronic diseases classified elsewhere: Secondary | ICD-10-CM | POA: Diagnosis present

## 2013-07-27 DIAGNOSIS — N200 Calculus of kidney: Secondary | ICD-10-CM | POA: Diagnosis present

## 2013-07-27 DIAGNOSIS — Z79899 Other long term (current) drug therapy: Secondary | ICD-10-CM

## 2013-07-27 DIAGNOSIS — T8140XA Infection following a procedure, unspecified, initial encounter: Secondary | ICD-10-CM | POA: Diagnosis present

## 2013-07-27 DIAGNOSIS — N179 Acute kidney failure, unspecified: Secondary | ICD-10-CM | POA: Diagnosis present

## 2013-07-27 DIAGNOSIS — Z950 Presence of cardiac pacemaker: Secondary | ICD-10-CM | POA: Diagnosis present

## 2013-07-27 DIAGNOSIS — I129 Hypertensive chronic kidney disease with stage 1 through stage 4 chronic kidney disease, or unspecified chronic kidney disease: Secondary | ICD-10-CM | POA: Diagnosis present

## 2013-07-27 DIAGNOSIS — M109 Gout, unspecified: Secondary | ICD-10-CM | POA: Diagnosis present

## 2013-07-27 DIAGNOSIS — Z8551 Personal history of malignant neoplasm of bladder: Secondary | ICD-10-CM

## 2013-07-27 DIAGNOSIS — N189 Chronic kidney disease, unspecified: Secondary | ICD-10-CM | POA: Diagnosis present

## 2013-07-27 DIAGNOSIS — I10 Essential (primary) hypertension: Secondary | ICD-10-CM | POA: Diagnosis present

## 2013-07-27 DIAGNOSIS — R011 Cardiac murmur, unspecified: Secondary | ICD-10-CM | POA: Diagnosis present

## 2013-07-27 DIAGNOSIS — H3530 Unspecified macular degeneration: Secondary | ICD-10-CM | POA: Diagnosis present

## 2013-07-27 DIAGNOSIS — Y838 Other surgical procedures as the cause of abnormal reaction of the patient, or of later complication, without mention of misadventure at the time of the procedure: Secondary | ICD-10-CM | POA: Diagnosis not present

## 2013-07-27 DIAGNOSIS — D62 Acute posthemorrhagic anemia: Secondary | ICD-10-CM | POA: Diagnosis not present

## 2013-07-27 DIAGNOSIS — N139 Obstructive and reflux uropathy, unspecified: Secondary | ICD-10-CM | POA: Diagnosis present

## 2013-07-27 DIAGNOSIS — N184 Chronic kidney disease, stage 4 (severe): Secondary | ICD-10-CM | POA: Diagnosis present

## 2013-07-27 DIAGNOSIS — M810 Age-related osteoporosis without current pathological fracture: Secondary | ICD-10-CM | POA: Diagnosis present

## 2013-07-27 DIAGNOSIS — F329 Major depressive disorder, single episode, unspecified: Secondary | ICD-10-CM | POA: Diagnosis present

## 2013-07-27 DIAGNOSIS — F3289 Other specified depressive episodes: Secondary | ICD-10-CM | POA: Diagnosis present

## 2013-07-27 DIAGNOSIS — E875 Hyperkalemia: Secondary | ICD-10-CM

## 2013-07-27 DIAGNOSIS — E871 Hypo-osmolality and hyponatremia: Secondary | ICD-10-CM | POA: Diagnosis present

## 2013-07-27 DIAGNOSIS — E861 Hypovolemia: Secondary | ICD-10-CM | POA: Diagnosis not present

## 2013-07-27 DIAGNOSIS — Z86718 Personal history of other venous thrombosis and embolism: Secondary | ICD-10-CM

## 2013-07-27 DIAGNOSIS — D72829 Elevated white blood cell count, unspecified: Secondary | ICD-10-CM | POA: Diagnosis present

## 2013-07-27 DIAGNOSIS — N390 Urinary tract infection, site not specified: Secondary | ICD-10-CM | POA: Clinically undetermined

## 2013-07-27 DIAGNOSIS — N201 Calculus of ureter: Principal | ICD-10-CM | POA: Diagnosis present

## 2013-07-27 DIAGNOSIS — R509 Fever, unspecified: Secondary | ICD-10-CM | POA: Diagnosis present

## 2013-07-27 HISTORY — PX: NEPHROLITHOTOMY: SHX5134

## 2013-07-27 LAB — TYPE AND SCREEN
ABO/RH(D): A POS
Antibody Screen: NEGATIVE

## 2013-07-27 SURGERY — NEPHROLITHOTOMY PERCUTANEOUS
Anesthesia: General | Laterality: Right

## 2013-07-27 MED ORDER — LIDOCAINE HCL (CARDIAC) 20 MG/ML IV SOLN
INTRAVENOUS | Status: DC | PRN
  Administered 2013-07-27: 80 mg via INTRAVENOUS

## 2013-07-27 MED ORDER — GLYCOPYRROLATE 0.2 MG/ML IJ SOLN
INTRAMUSCULAR | Status: DC | PRN
  Administered 2013-07-27: .4 mg via INTRAVENOUS

## 2013-07-27 MED ORDER — FENTANYL CITRATE 0.05 MG/ML IJ SOLN
INTRAMUSCULAR | Status: AC
  Filled 2013-07-27: qty 2

## 2013-07-27 MED ORDER — 0.9 % SODIUM CHLORIDE (POUR BTL) OPTIME
TOPICAL | Status: DC | PRN
  Administered 2013-07-27: 1000 mL

## 2013-07-27 MED ORDER — CEFAZOLIN SODIUM-DEXTROSE 2-3 GM-% IV SOLR
2.0000 g | INTRAVENOUS | Status: AC
  Administered 2013-07-27: 2 g via INTRAVENOUS

## 2013-07-27 MED ORDER — LIDOCAINE HCL (CARDIAC) 20 MG/ML IV SOLN
INTRAVENOUS | Status: AC
  Filled 2013-07-27: qty 5

## 2013-07-27 MED ORDER — MIDAZOLAM HCL 2 MG/2ML IJ SOLN
INTRAMUSCULAR | Status: AC
  Filled 2013-07-27: qty 2

## 2013-07-27 MED ORDER — DOCUSATE SODIUM 100 MG PO CAPS
100.0000 mg | ORAL_CAPSULE | Freq: Two times a day (BID) | ORAL | Status: DC
  Administered 2013-07-27 – 2013-08-04 (×15): 100 mg via ORAL
  Filled 2013-07-27 (×17): qty 1

## 2013-07-27 MED ORDER — VITAMIN C 500 MG PO TABS
500.0000 mg | ORAL_TABLET | Freq: Every day | ORAL | Status: DC
Start: 2013-07-27 — End: 2013-07-29
  Administered 2013-07-27 – 2013-07-29 (×3): 500 mg via ORAL
  Filled 2013-07-27 (×3): qty 1

## 2013-07-27 MED ORDER — DOCUSATE SODIUM 100 MG PO CAPS
100.0000 mg | ORAL_CAPSULE | Freq: Every day | ORAL | Status: DC | PRN
  Filled 2013-07-27: qty 1

## 2013-07-27 MED ORDER — HYDROCODONE-ACETAMINOPHEN 5-325 MG PO TABS: 1.0000 | ORAL_TABLET | ORAL | Status: DC | PRN

## 2013-07-27 MED ORDER — LACTATED RINGERS IV SOLN
INTRAVENOUS | Status: DC
  Administered 2013-07-27 (×2): via INTRAVENOUS

## 2013-07-27 MED ORDER — HYDROMORPHONE HCL PF 1 MG/ML IJ SOLN
0.5000 mg | INTRAMUSCULAR | Status: DC | PRN
  Administered 2013-07-27: 0.5 mg via INTRAVENOUS
  Filled 2013-07-27: qty 1

## 2013-07-27 MED ORDER — DIPHENHYDRAMINE HCL 12.5 MG/5ML PO ELIX: 12.5000 mg | ORAL_SOLUTION | Freq: Four times a day (QID) | ORAL | Status: DC | PRN

## 2013-07-27 MED ORDER — MIDAZOLAM HCL 5 MG/5ML IJ SOLN
INTRAMUSCULAR | Status: DC | PRN
  Administered 2013-07-27: 1 mg via INTRAVENOUS

## 2013-07-27 MED ORDER — DEXTROSE-NACL 5-0.45 % IV SOLN
INTRAVENOUS | Status: DC
  Administered 2013-07-27 – 2013-07-29 (×4): via INTRAVENOUS

## 2013-07-27 MED ORDER — ROCURONIUM BROMIDE 100 MG/10ML IV SOLN
INTRAVENOUS | Status: AC
  Filled 2013-07-27: qty 1

## 2013-07-27 MED ORDER — SODIUM CHLORIDE 0.9 % IR SOLN
Status: DC | PRN
  Administered 2013-07-27: 6000 mL

## 2013-07-27 MED ORDER — PROPOFOL 10 MG/ML IV BOLUS
INTRAVENOUS | Status: DC | PRN
  Administered 2013-07-27: 100 mg via INTRAVENOUS

## 2013-07-27 MED ORDER — PROPOFOL 10 MG/ML IV BOLUS
INTRAVENOUS | Status: AC
  Filled 2013-07-27: qty 20

## 2013-07-27 MED ORDER — ONDANSETRON HCL 4 MG/2ML IJ SOLN
INTRAMUSCULAR | Status: AC
  Filled 2013-07-27: qty 2

## 2013-07-27 MED ORDER — RISAQUAD PO CAPS
1.0000 | ORAL_CAPSULE | Freq: Every day | ORAL | Status: DC
  Administered 2013-07-27 – 2013-08-04 (×9): 1 via ORAL
  Filled 2013-07-27 (×9): qty 1

## 2013-07-27 MED ORDER — NEOSTIGMINE METHYLSULFATE 10 MG/10ML IV SOLN
INTRAVENOUS | Status: DC | PRN
  Administered 2013-07-27: 3 mg via INTRAVENOUS

## 2013-07-27 MED ORDER — DIPHENHYDRAMINE HCL 50 MG/ML IJ SOLN: 12.5000 mg | Freq: Four times a day (QID) | INTRAMUSCULAR | Status: DC | PRN

## 2013-07-27 MED ORDER — HYDROCODONE-ACETAMINOPHEN 5-325 MG PO TABS
1.0000 | ORAL_TABLET | Freq: Four times a day (QID) | ORAL | Status: DC | PRN
  Administered 2013-07-27 – 2013-07-28 (×2): 1 via ORAL
  Filled 2013-07-27 (×2): qty 1

## 2013-07-27 MED ORDER — CEFAZOLIN SODIUM-DEXTROSE 2-3 GM-% IV SOLR
INTRAVENOUS | Status: AC
  Filled 2013-07-27: qty 50

## 2013-07-27 MED ORDER — VITAMIN D3 25 MCG (1000 UNIT) PO TABS
1000.0000 [IU] | ORAL_TABLET | Freq: Every day | ORAL | Status: DC
  Administered 2013-07-27 – 2013-07-29 (×3): 1000 [IU] via ORAL
  Filled 2013-07-27 (×3): qty 1

## 2013-07-27 MED ORDER — ONDANSETRON HCL 4 MG/2ML IJ SOLN
INTRAMUSCULAR | Status: DC | PRN
  Administered 2013-07-27: 4 mg via INTRAVENOUS

## 2013-07-27 MED ORDER — FENTANYL CITRATE 0.05 MG/ML IJ SOLN
25.0000 ug | INTRAMUSCULAR | Status: DC | PRN
  Administered 2013-07-27: 50 ug via INTRAVENOUS

## 2013-07-27 MED ORDER — AMLODIPINE BESYLATE 2.5 MG PO TABS
2.5000 mg | ORAL_TABLET | Freq: Every morning | ORAL | Status: DC
  Administered 2013-07-28 – 2013-08-02 (×6): 2.5 mg via ORAL
  Filled 2013-07-27 (×6): qty 1

## 2013-07-27 MED ORDER — MUPIROCIN 2 % EX OINT
1.0000 "application " | TOPICAL_OINTMENT | Freq: Two times a day (BID) | CUTANEOUS | Status: DC
  Administered 2013-07-27 – 2013-08-03 (×14): 1 via NASAL
  Filled 2013-07-27: qty 22

## 2013-07-27 MED ORDER — VITAMIN E 180 MG (400 UNIT) PO CAPS
400.0000 [IU] | ORAL_CAPSULE | Freq: Every day | ORAL | Status: DC
  Administered 2013-07-27 – 2013-07-29 (×3): 400 [IU] via ORAL
  Filled 2013-07-27 (×3): qty 1

## 2013-07-27 MED ORDER — ROCURONIUM BROMIDE 100 MG/10ML IV SOLN
INTRAVENOUS | Status: DC | PRN
  Administered 2013-07-27: 30 mg via INTRAVENOUS

## 2013-07-27 MED ORDER — SUCCINYLCHOLINE CHLORIDE 20 MG/ML IJ SOLN
INTRAMUSCULAR | Status: DC | PRN
  Administered 2013-07-27: 100 mg via INTRAVENOUS

## 2013-07-27 MED ORDER — ONDANSETRON HCL 4 MG/2ML IJ SOLN
4.0000 mg | INTRAMUSCULAR | Status: DC | PRN
  Administered 2013-07-30: 4 mg via INTRAVENOUS
  Filled 2013-07-27: qty 2

## 2013-07-27 MED ORDER — FENTANYL CITRATE 0.05 MG/ML IJ SOLN
INTRAMUSCULAR | Status: AC
  Filled 2013-07-27: qty 5

## 2013-07-27 MED ORDER — SODIUM CHLORIDE 0.9 % IR SOLN
Status: DC | PRN
  Administered 2013-07-27: 1000 mL

## 2013-07-27 MED ORDER — ACETAMINOPHEN 325 MG PO TABS
650.0000 mg | ORAL_TABLET | ORAL | Status: DC | PRN
  Administered 2013-07-28 – 2013-07-29 (×2): 650 mg via ORAL
  Filled 2013-07-27 (×2): qty 2

## 2013-07-27 MED ORDER — FENTANYL CITRATE 0.05 MG/ML IJ SOLN
INTRAMUSCULAR | Status: DC | PRN
  Administered 2013-07-27 (×2): 50 ug via INTRAVENOUS

## 2013-07-27 MED ORDER — IOHEXOL 300 MG/ML  SOLN
INTRAMUSCULAR | Status: DC | PRN
  Administered 2013-07-27: 10 mL

## 2013-07-27 MED ORDER — CEPHALEXIN 250 MG PO CAPS
250.0000 mg | ORAL_CAPSULE | Freq: Three times a day (TID) | ORAL | Status: DC
  Administered 2013-07-27 – 2013-07-29 (×6): 250 mg via ORAL
  Filled 2013-07-27 (×8): qty 1

## 2013-07-27 SURGICAL SUPPLY — 53 items
APL SKNCLS STERI-STRIP NONHPOA (GAUZE/BANDAGES/DRESSINGS) ×1
BAG URINE DRAINAGE (UROLOGICAL SUPPLIES) ×2 IMPLANT
BASKET ZERO TIP NITINOL 2.4FR (BASKET) ×2 IMPLANT
BENZOIN TINCTURE PRP APPL 2/3 (GAUZE/BANDAGES/DRESSINGS) ×5 IMPLANT
BLADE SURG 15 STRL LF DISP TIS (BLADE) ×1 IMPLANT
BLADE SURG 15 STRL SS (BLADE)
BSKT STON RTRVL ZERO TP 2.4FR (BASKET) ×1
CARTRIDGE STONEBREAK CO2 KIDNE (ELECTROSURGICAL) IMPLANT
CATCHER STONE W/TUBE ADAPTER (UROLOGICAL SUPPLIES) ×1 IMPLANT
CATH AINSWORTH 30CC 24FR (CATHETERS) ×1 IMPLANT
CATH MULTI PURPOSE 16FR DRAIN (STENTS) ×1 IMPLANT
CATH ROBINSON RED A/P 20FR (CATHETERS) IMPLANT
CATH URET 5FR 28IN OPEN ENDED (CATHETERS) IMPLANT
CATH URET DUAL LUMEN 6-10FR 50 (CATHETERS) ×1 IMPLANT
CATH X-FORCE N30 NEPHROSTOMY (TUBING) ×1 IMPLANT
COVER SURGICAL LIGHT HANDLE (MISCELLANEOUS) ×1 IMPLANT
DRAPE C-ARM 42X120 X-RAY (DRAPES) ×2 IMPLANT
DRAPE CAMERA CLOSED 9X96 (DRAPES) ×2 IMPLANT
DRAPE LINGEMAN PERC (DRAPES) ×2 IMPLANT
DRAPE SURG IRRIG POUCH 19X23 (DRAPES) ×2 IMPLANT
DRSG TEGADERM 8X12 (GAUZE/BANDAGES/DRESSINGS) ×4 IMPLANT
FIBER LASER FLEXIVA 1000 (UROLOGICAL SUPPLIES) ×1 IMPLANT
FIBER LASER FLEXIVA 200 (UROLOGICAL SUPPLIES) ×1 IMPLANT
FIBER LASER FLEXIVA 365 (UROLOGICAL SUPPLIES) ×1 IMPLANT
FIBER LASER FLEXIVA 550 (UROLOGICAL SUPPLIES) IMPLANT
FIBER LASER TRAC TIP (UROLOGICAL SUPPLIES) ×1 IMPLANT
GLOVE SURG SS PI 8.0 STRL IVOR (GLOVE) ×1 IMPLANT
GOWN STRL REUS W/TWL XL LVL3 (GOWN DISPOSABLE) ×1 IMPLANT
GUIDEWIRE AMPLATZ STIFF 0.35 (WIRE) ×2 IMPLANT
KIT BASIN OR (CUSTOM PROCEDURE TRAY) ×2 IMPLANT
MANIFOLD NEPTUNE II (INSTRUMENTS) ×2 IMPLANT
MASK EYE SHIELD (GAUZE/BANDAGES/DRESSINGS) ×1 IMPLANT
NS IRRIG 1000ML POUR BTL (IV SOLUTION) ×2 IMPLANT
PACK BASIC VI WITH GOWN DISP (CUSTOM PROCEDURE TRAY) ×2 IMPLANT
PAD ABD 7.5X8 STRL (GAUZE/BANDAGES/DRESSINGS) ×4 IMPLANT
PAD ABD 8X10 STRL (GAUZE/BANDAGES/DRESSINGS) ×1 IMPLANT
PROBE KIDNEY STONEBRKR 2.0X425 (ELECTROSURGICAL) IMPLANT
PROBE LITHOCLAST ULTRA 3.8X403 (UROLOGICAL SUPPLIES) ×1 IMPLANT
PROBE PNEUMATIC 1.0MMX570MM (UROLOGICAL SUPPLIES) ×1 IMPLANT
SET IRRIG Y TYPE TUR BLADDER L (SET/KITS/TRAYS/PACK) ×2 IMPLANT
SHEATH ACCESS URETERAL 24CM (SHEATH) ×1 IMPLANT
SPONGE GAUZE 4X4 12PLY (GAUZE/BANDAGES/DRESSINGS) ×2 IMPLANT
SPONGE LAP 4X18 X RAY DECT (DISPOSABLE) ×2 IMPLANT
STONE CATCHER W/TUBE ADAPTER (UROLOGICAL SUPPLIES) IMPLANT
SUT SILK 2 0 30  PSL (SUTURE) ×1
SUT SILK 2 0 30 PSL (SUTURE) ×1 IMPLANT
SYR 20CC LL (SYRINGE) ×4 IMPLANT
SYRINGE 10CC LL (SYRINGE) ×2 IMPLANT
TOWEL OR 17X26 10 PK STRL BLUE (TOWEL DISPOSABLE) ×2 IMPLANT
TOWEL OR NON WOVEN STRL DISP B (DISPOSABLE) ×2 IMPLANT
TRAY FOLEY CATH 16FRSI W/METER (SET/KITS/TRAYS/PACK) ×1 IMPLANT
TUBE CONNECTING VINYL 14FR 30C (MISCELLANEOUS) ×1 IMPLANT
TUBING CONNECTING 10 (TUBING) ×5 IMPLANT

## 2013-07-27 NOTE — Transfer of Care (Signed)
Immediate Anesthesia Transfer of Care Note  Patient: [name] [name]  Procedure(s) Performed: Procedure(s): RIGHT PERCUTANEOUS NEPHROLITHOTOMY  (Right)  Patient Location: PACU  Anesthesia Type:General  Level of Consciousness: sedated  Airway & Oxygen Therapy: Patient Spontanous Breathing and Patient connected to face mask oxygen  Post-op Assessment: Report given to PACU RN and Post -op Vital signs reviewed and stable  Post vital signs: Reviewed and stable  Complications: No apparent anesthesia complications

## 2013-07-27 NOTE — Brief Op Note (Signed)
07/27/2013  1:21 PM  PATIENT:  [name]  80 y.o. male  PRE-OPERATIVE DIAGNOSIS:   1.6cm right renal and ureteral stone  POST-OPERATIVE DIAGNOSIS: 1.6 right renal and ureteral stone  PROCEDURE:  Procedure(s): 1st Stage RIGHT PERCUTANEOUS NEPHROLITHOTOMY  (Right)  SURGEON:  Surgeon(s) and Role:    * Irine Seal, MD - Primary  PHYSICIAN ASSISTANT:   ASSISTANTS: none   ANESTHESIA:   general  EBL:  Total I/O In: 1000 [I.V.:1000] Out: 75 [Stool:75]  BLOOD ADMINISTERED:none  DRAINS: 75fr right loop nephrostomy   LOCAL MEDICATIONS USED:  NONE  SPECIMEN:  Source of Specimen:  stone fragments  DISPOSITION OF SPECIMEN:  discarded  COUNTS:  YES  TOURNIQUET:  * No tourniquets in log *  DICTATION: .Other Dictation: Dictation Number [id number]  PLAN OF CARE: Admit for overnight observation  PATIENT DISPOSITION:  PACU - hemodynamically stable.   Delay start of Pharmacological VTE agent (>24hrs) due to surgical blood loss or risk of bleeding: not applicable

## 2013-07-27 NOTE — Anesthesia Postprocedure Evaluation (Signed)
  Anesthesia Post-op Note  Patient: [name] [name]  Procedure(s) Performed: Procedure(s) (LRB): RIGHT PERCUTANEOUS NEPHROLITHOTOMY  (Right)  Patient Location: PACU  Anesthesia Type: General  Level of Consciousness: awake and alert   Airway and Oxygen Therapy: Patient Spontanous Breathing  Post-op Pain: mild  Post-op Assessment: Post-op Vital signs reviewed, Patient's Cardiovascular Status Stable, Respiratory Function Stable, Patent Airway and No signs of Nausea or vomiting  Last Vitals:  Filed Vitals:   07/27/13 1440  BP: 171/69  Pulse: 75  Temp: 36.5 C  Resp: 16    Post-op Vital Signs: stable   Complications: No apparent anesthesia complications

## 2013-07-27 NOTE — Anesthesia Preprocedure Evaluation (Signed)
Anesthesia Evaluation  Patient identified by MRN, date of birth, ID band Patient awake    Reviewed: Allergy & Precautions, H&P , NPO status , Patient's Chart, lab work & pertinent test results  Airway Mallampati: II TM Distance: >3 FB Neck ROM: Full    Dental no notable dental hx.    Pulmonary former smoker,  breath sounds clear to auscultation  Pulmonary exam normal       Cardiovascular hypertension, Pt. on medications + pacemaker + Valvular Problems/Murmurs AS Rhythm:Regular Rate:Normal     Neuro/Psych PSYCHIATRIC DISORDERS Depression negative neurological ROS     GI/Hepatic negative GI ROS, Neg liver ROS,   Endo/Other  negative endocrine ROS  Renal/GU Renal disease  negative genitourinary   Musculoskeletal negative musculoskeletal ROS (+)   Abdominal   Peds negative pediatric ROS (+)  Hematology negative hematology ROS (+)   Anesthesia Other Findings   Reproductive/Obstetrics negative OB ROS                           Anesthesia Physical Anesthesia Plan  ASA: III  Anesthesia Plan: General   Post-op Pain Management:    Induction: Intravenous  Airway Management Planned: Oral ETT  Additional Equipment:   Intra-op Plan:   Post-operative Plan: Extubation in OR  Informed Consent: I have reviewed the patients History and Physical, chart, labs and discussed the procedure including the risks, benefits and alternatives for the proposed anesthesia with the patient or authorized representative who has indicated his/her understanding and acceptance.   Dental advisory given  Plan Discussed with: CRNA  Anesthesia Plan Comments:         Anesthesia Quick Evaluation

## 2013-07-27 NOTE — Discharge Instructions (Addendum)
Ureteral Stent Implantation, Care After Refer to this sheet in the next few weeks. These instructions provide you with information on caring for yourself after your procedure. Your health care provider may also give you more specific instructions. Your treatment has been planned according to current medical practices, but problems sometimes occur. Call your health care provider if you have any problems or questions after your procedure. WHAT TO EXPECT AFTER THE PROCEDURE You should be back to normal activity within 48 hours after the procedure. Nausea and vomiting may occur and are commonly the result of anesthesia. It is common to experience sharp pain in the back or lower abdomen and penis with voiding. This is caused by movement of the ends of the stent with the act of urinating.It usually goes away within minutes after you have stopped urinating. HOME CARE INSTRUCTIONS Make sure to drink plenty of fluids. You may have small amounts of bleeding, causing your urine to be red. This is normal. Certain movements may trigger pain or a feeling that you need to urinate. You may be given medications to prevent infection or bladder spasms. Be sure to take all medications as directed. Only take over-the-counter or prescription medicines for pain, discomfort, or fever as directed by your health care provider. Do not take aspirin, as this can make bleeding worse. Your stent will be left in until the blockage is resolved. This may take 2 weeks or longer, depending on the reason for stent implantation. You may have an X-ray exam to make sure your ureter is open and that the stent has not moved out of position (migrated). The stent can be removed by your health care provider in the office. Medications may be given for comfort while the stent is being removed. Be sure to keep all follow-up appointments so your health care provider can check that you are healing properly. SEEK MEDICAL CARE IF:  You experience  increasing pain.  Your pain medication is not working. SEEK IMMEDIATE MEDICAL CARE IF:  Your urine is dark red or has blood clots.  You have a fever, chills, feeling sick to your stomach (nausea), or vomiting.  Your pain is not relieved by pain medication. Document Released: 11/11/2012 Document Reviewed: 11/11/2012 [facility] Patient Information 2014 [place].

## 2013-07-28 ENCOUNTER — Observation Stay (HOSPITAL_COMMUNITY): Payer: Medicare Other

## 2013-07-28 LAB — BASIC METABOLIC PANEL
BUN: 33 mg/dL — ABNORMAL HIGH (ref 6–23)
CO2: 25 mEq/L (ref 19–32)
Calcium: 8.2 mg/dL — ABNORMAL LOW (ref 8.4–10.5)
Chloride: 99 mEq/L (ref 96–112)
Creatinine, Ser: 2.54 mg/dL — ABNORMAL HIGH (ref 0.50–1.35)
GFR calc non Af Amer: 21 mL/min — ABNORMAL LOW (ref >90)
GFR, EST AFRICAN AMERICAN: 24 mL/min — AB (ref >90)
Glucose, Bld: 112 mg/dL — ABNORMAL HIGH (ref 70–99)
POTASSIUM: 5.3 meq/L (ref 3.7–5.3)
Sodium: 135 mEq/L — ABNORMAL LOW (ref 137–147)

## 2013-07-28 LAB — HEMOGLOBIN AND HEMATOCRIT, BLOOD
HEMATOCRIT: 32.9 % — AB (ref 39.0–52.0)
HEMOGLOBIN: 10.7 g/dL — AB (ref 13.0–17.0)

## 2013-07-28 MED ORDER — HYDROCODONE-ACETAMINOPHEN 5-325 MG PO TABS: 1.0000 | ORAL_TABLET | Freq: Four times a day (QID) | ORAL | Status: DC | PRN

## 2013-07-28 MED ORDER — CEPHALEXIN 250 MG PO CAPS: 250.0000 mg | ORAL_CAPSULE | Freq: Every day | ORAL | Status: DC

## 2013-07-28 NOTE — Progress Notes (Signed)
OT Cancellation Note  Patient Details Name: [name] [name] MRN: [id number] DOB: 1921/10/17   Cancelled Treatment:    Reason Eval/Treat Not Completed: Other (comment).  Spoke to PT.  Pt vomited during their session, and family would prefer we come back tomorrow.    Lesle Chris 07/28/2013, 3:39 PM Lesle Chris, OTR/L [phone] 07/28/2013

## 2013-07-28 NOTE — Progress Notes (Signed)
Patient ID: [name] [name], male   DOB: [birth year], 80 y.o.   MRN: [id number] 1 Day Post-Op  Subjective: [name] [name] is doing well post right PCNL without pain or fever.   A KUB this morning shows his tube in good position. The urine from the tube and conduit remain bloody.  His chemistries are pending but the Hgb is 10.7.  ROS:  Review of Systems  Constitutional: Negative for fever.  Gastrointestinal: Negative for nausea and abdominal pain.    Anti-infectives: Anti-infectives   Start     Dose/Rate Route Frequency Ordered Stop   07/27/13 1600  cephALEXin (KEFLEX) capsule 250 mg     250 mg Oral 3 times daily 07/27/13 1447     07/27/13 0835  ceFAZolin (ANCEF) IVPB 2 g/50 mL premix     2 g 100 mL/hr over 30 Minutes Intravenous 30 min pre-op 07/27/13 [id number] 07/27/13 1139      Current Facility-Administered Medications  Medication Dose Route Frequency Provider Last Rate Last Dose  . acetaminophen (TYLENOL) tablet 650 mg  650 mg Oral Q4H PRN Irine Seal, MD      . acidophilus (RISAQUAD) capsule 1 capsule  1 capsule Oral Daily Irine Seal, MD   1 capsule at 07/27/13 1741  . amLODipine (NORVASC) tablet 2.5 mg  2.5 mg Oral q morning - 10a Irine Seal, MD      . cephALEXin [facility]) capsule 250 mg  250 mg Oral TID Irine Seal, MD   250 mg at 07/27/13 2157  . cholecalciferol (VITAMIN D) tablet 1,000 Units  1,000 Units Oral Daily Irine Seal, MD   1,000 Units at 07/27/13 1741  . dextrose 5 %-0.45 % sodium chloride infusion   Intravenous Continuous Irine Seal, MD 75 mL/hr at 07/28/13 0317    . diphenhydrAMINE (BENADRYL) injection 12.5 mg  12.5 mg Intravenous Q6H PRN Irine Seal, MD       Or  . diphenhydrAMINE (BENADRYL) 12.5 MG/5ML elixir 12.5 mg  12.5 mg Oral Q6H PRN Irine Seal, MD      . docusate sodium (COLACE) capsule 100 mg  100 mg Oral Daily PRN Irine Seal, MD      . docusate sodium (COLACE) capsule 100 mg  100 mg Oral BID Irine Seal, MD   100 mg at 07/27/13 2157  . HYDROcodone-acetaminophen  (NORCO/VICODIN) 5-325 MG per tablet 1 tablet  1 tablet Oral Q6H PRN Irine Seal, MD   1 tablet at 07/27/13 1752  . HYDROcodone-acetaminophen (NORCO/VICODIN) 5-325 MG per tablet 1-2 tablet  1-2 tablet Oral Q4H PRN Irine Seal, MD      . HYDROmorphone (DILAUDID) injection 0.5-1 mg  0.5-1 mg Intravenous Q2H PRN Irine Seal, MD   0.5 mg at 07/27/13 1507  . mupirocin ointment (BACTROBAN) 2 % 1 application  1 application Nasal BID Irine Seal, MD   1 application at 0000000 2157  . ondansetron (ZOFRAN) injection 4 mg  4 mg Intravenous Q4H PRN Irine Seal, MD      . vitamin C (ASCORBIC ACID) tablet 500 mg  500 mg Oral Daily Irine Seal, MD   500 mg at 07/27/13 1741  . vitamin E capsule 400 Units  400 Units Oral Daily Irine Seal, MD   400 Units at 07/27/13 1741     Objective: Vital signs in last 24 hours: Temp:  [97.7 F (36.5 C)-98.7 F (37.1 C)] 98.7 F (37.1 C) (05/06 0520) Pulse Rate:  [70-84] 76 (05/06 0520) Resp:  [12-22] 20 (05/06 0520) BP: (122-171)/(63-83) 122/63 mmHg (  05/06 0520) SpO2:  [97 %-100 %] 99 % (05/06 0520) Weight:  [76.204 kg (168 lb)] 76.204 kg (168 lb) (05/05 1440)  Intake/Output from previous day: 05/05 0701 - 05/06 0700 In: 1641.3 [I.V.:1641.3] Out: 795 [Urine:720; Stool:75] Intake/Output this shift: Total I/O In: -  Out: 350 [Urine:350]   Physical Exam  Constitutional: He is well-developed, well-nourished, and in no distress.  Abdominal: Soft. There is no tenderness.    Lab Results:  No results found for this basename: WBC, HGB, HCT, PLT,  in the last 72 hours BMET No results found for this basename: NA, K, CL, CO2, GLUCOSE, BUN, CREATININE, CALCIUM,  in the last 72 hours PT/INR No results found for this basename: LABPROT, INR,  in the last 72 hours ABG No results found for this basename: PHART, PCO2, PO2, HCO3,  in the last 72 hours  Studies/Results: Dg Abd 1 View  07/27/2013   CLINICAL DATA:  Right renal and ureteral stone.  EXAM: ABDOMEN - 1 VIEW; DG  C-ARM 1-60 MIN - NRPT [facility]  COMPARISON:  None.  FINDINGS: Single C-arm image demonstrates a right nephrostomy catheter in the right renal pelvis. Contrast opacifies the proximal right ureter and the renal pelvis and calices.  IMPRESSION: Right percutaneous nephrostomy catheter in place.   Electronically Signed   By: Rozetta Nunnery M.D.   On: 07/27/2013 16:52   Dg C-arm 1-60 Min-no Report  07/27/2013   CLINICAL DATA:  Right renal and ureteral stone.  EXAM: ABDOMEN - 1 VIEW; DG C-ARM 1-60 MIN - NRPT [facility]  COMPARISON:  None.  FINDINGS: Single C-arm image demonstrates a right nephrostomy catheter in the right renal pelvis. Contrast opacifies the proximal right ureter and the renal pelvis and calices.  IMPRESSION: Right percutaneous nephrostomy catheter in place.   Electronically Signed   By: Rozetta Nunnery M.D.   On: 07/27/2013 16:52     Assessment: s/p Procedure(s): RIGHT PERCUTANEOUS NEPHROLITHOTOMY   He is  Doing well without complaints.   His urine remains bloody. There are no obvious stones on KUB but his stones are struvite and not very radioopaque.   Plan: I am going to get a CT stone study and consider discharge later today.      LOS: 1 day    Irine Seal 07/28/2013

## 2013-07-28 NOTE — Op Note (Signed)
NAMEMarland [name]  [name], [name] NO.:  [id number]  MEDICAL RECORD NO.:  [id number]  LOCATION:  13                         FACILITY:  [facility]  PHYSICIAN:  Marshall Cork. Jeffie Pollock, M.D.    DATE OF BIRTH:  [birth year]  DATE OF PROCEDURE:  07/27/2013 DATE OF DISCHARGE:                              OPERATIVE REPORT   PROCEDURE:  First stage, percutaneous nephrolithotomy for 16-mm right lower pole stone and right ureteral stone.  PREOPERATIVE DIAGNOSIS:  A 16-mm right lower pole stone and right ureteral stone.  POSTOPERATIVE DIAGNOSIS:  A 16-mm right lower pole stone and right ureteral stone.  SURGEON:  Marshall Cork. Jeffie Pollock, MD  ANESTHESIA:  General.  ESTIMATED BLOOD LOSS:  Minimal.  COMPLICATIONS:  None.  INDICATIONS:  [name] [name] is an 80 year old white male with a history of recurrent urolithiasis who recently was admitted with right hydronephrosis from distal ureteral obstruction at his ileal-ureteral anastomosis.  He was found to have some stone debris in the distal ureter.  Also, he had an enlarging 16-mm partially branched left lower pole stone.  A left lower pole tract was created to decompress the kidney at the initial encounter.  He then came back last week and had a nephroureteral catheter and returns now for percutaneous nephrolithotomy.  FINDINGS OF PROCEDURE:  He was given Ancef.  He was taken to the operating room where general anesthetic was induced on the holding room stretcher.  His urostomy bag was placed to straight drainage.  He was then rolled prone on chest rolls with great care taken to pad all pressure points.  The nephrostomy tract was then prepped with Betadine solution.  He was draped in usual sterile fashion.  Because of the location of the stones, initially felt that percutaneous nephroureterostomy tube would be appropriate through a small sheath, so the loop nephroureteral tube was wired out under fluoroscopy.  Double- lumen catheter was then passed  and a second safety wire was placed down the ureter across the ureteroileal anastomosis.  A 12/14, 25-cm ureteral access sheath was then passed over the working wire, and the working wire and inner core removed.  The digital ureteroscope was then inserted and the collecting system was inspected.  Initially, no stones were seen.  However, with considerable effort, I was able to angulate the scope into the stone containing calix which unfortunately was parallel to the nephrostomy tract.  Initially, I was unable to get the laser fiber through, but after some gentle manipulation of the stone with a basket, I was eventually able to get the laser into the lower pole calyx where the stone reside at.  The laser was then set on 0.5 watts and 5 hertz and treatment was initiated with initial fragmentation.  I then increased the frequency to 50 hertz and continued fragmenting the stone until it appeared to be fragmented in small manageable pieces.  At this point, a basket was reinserted and attempts were made to remove the fragments.  A few small fragments were removed, but because of layering of the stone in the calix, I was unable to sufficiently flex the scope to get a direct bearing on the residual fragments.  At this point, a ureteroscope was  passed down the ureter to the ureteral- ileal anastomosis.  Two stones were noted in this area, largest about 6 mm and they were removed.  I was unable to pass the scope into the butt of the loop.  There was no stenosis and no residual stones.  I then backed the ureteroscope into the renal pelvis and removed several small fragments with clots; however, I did not feel I had eliminated the bulk of the fragments from the lower pole.  I then, with great difficulty, negotiated the scope back into the lower pole of the calyx that became progressively more difficult and I essentially had to have the sheath removed to get the flexibility I needed, and I  made an additional attempts to basket some residual larger stone fragments, but was not able to engage any of them effectively due to the severe angulation of the scope.  At this point, I changed to the 7-French fiberoptic ureteral scope, but it had poor visualization and less effective angulation, so I was unable to enter the calyx.  At this point, I felt it was most appropriate to replace the nephrostomy tube, so a 16-French loop nephrostomy tube was placed in the tract and a coil was formed.  The safety wire was then removed.  The nephrostomy tube was secured with a silk suture and placed to straight drainage.  The patient was then turned back supine.  His anesthetic was reversed and he was moved to the recovery room in stable condition.  There were no complications.  This was the first stage of a possible multistaged procedure.     Marshall Cork. Jeffie Pollock, M.D.     JJW/MEDQ  D:  07/27/2013  T:  07/28/2013  Job:  [id number]

## 2013-07-28 NOTE — Discharge Summary (Signed)
Physician Discharge Summary  Patient ID: [name] [name] MRN: [id number] DOB/AGE: 09/07/1921 80 y.o.  Admit date: 07/27/2013 Discharge date: 07/28/2013  Admission Diagnoses:  Right renal and ureteral stones.   Discharge Diagnoses:  Active Problems:   Renal stone   Past Medical History  Diagnosis Date  . Hypertension   . Aortic valve stenosis   . Gout   . Hx of bladder cancer   . Deep venous thrombosis     hx of  LEFT ARM AFTER PACEMAKER INSERTION ABOUT 6 YRS AGO  . Osteoporosis   . Renal failure   . Heart murmur   . Depression   . Chronic kidney disease   . Kidney stone   . Prostate cancer     AND BLADDER CANCER - S/P URETEROILEAL CONDUIT  . Bradycardia     HX OF SYMPTOMATIC BRADYCARDIA - STATUS POST PERMANENT PACEMAKER  . S/P ileal conduit     HX BLADDER AND PROSTATE CANCER  . Macular degeneration     PT STATES HIS VISION STILL OKAY FOR DRIVING  . 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 GREGG TAYLOR -ELECTROPHYSIOLOGIST  . Hydronephrosis with obstructing calculus     RIGHT SIDE - HOSPITALIZED 07-14-13 OT 07-17-13    Surgeries: Procedure(s): RIGHT PERCUTANEOUS NEPHROLITHOTOMY  on 07/27/2013   Consultants (if any):    Discharged Condition: Improved  Hospital Course: [name] [name] is an 80 y.o. male who was admitted 07/27/2013 with a diagnosis of right renal and ureteral stones and went to the operating room on 07/27/2013 and underwent the above named procedures.  I was able to clear the ureteral stone and fragmented the renal stone but the position of the stone was such in relation to the nephrostomy tube that I was not able to retrieve all of the fragments.  A KUB today didn't show stones but a CT shows a 38mm LLP stone and a 6.52mm cluster of fragments which is a significant reduction in stone burden.  The ureter was mildly dilated but the level of the ureteroileal anastamosis wasn't seen.   He is doing well now with clearing urine and is ready for  discharge.   He was given perioperative antibiotics:  Anti-infectives   Start     Dose/Rate Route Frequency Ordered Stop   07/28/13 0000  cephALEXin (KEFLEX) 250 MG capsule     250 mg Oral Daily at bedtime 07/28/13 1159     07/27/13 1600  cephALEXin (KEFLEX) capsule 250 mg     250 mg Oral 3 times daily 07/27/13 1447     07/27/13 0835  ceFAZolin (ANCEF) IVPB 2 g/50 mL premix     2 g 100 mL/hr over 30 Minutes Intravenous 30 min pre-op 07/27/13 0835 07/27/13 1139    .  He was given sequential compression devices for DVT prophylaxis.  He benefited maximally from the hospital stay and there were no complications.    Recent vital signs:  Filed Vitals:   07/28/13 0520  BP: 122/63  Pulse: 76  Temp: 98.7 F (37.1 C)  Resp: 20    Recent laboratory studies:  Lab Results  Component Value Date   HGB 10.7* 07/28/2013   HGB 12.7* 07/20/2013   HGB 11.3* 07/16/2013   Lab Results  Component Value Date   WBC 9.0 07/20/2013   PLT 269 07/20/2013   Lab Results  Component Value Date   INR 0.88 07/20/2013   Lab Results  Component Value Date   NA 135* 07/28/2013  K 5.3 07/29/2013   CL 99 07/29/2013   CO2 25 Jul 29, 2013   BUN 33* 2013/07/29   CREATININE 2.54* 07-29-13   GLUCOSE 112* 07-29-13    Discharge Medications:     Medication List         acidophilus Caps capsule  Take 1 capsule by mouth daily.     amLODipine 2.5 MG tablet  Commonly known as:  NORVASC  Take 2.5 mg by mouth every morning.     aspirin 81 MG tablet  Take 1 tablet (81 mg total) by mouth daily.     cephALEXin 250 MG capsule  Commonly known as:  KEFLEX  Take 1 capsule (250 mg total) by mouth at bedtime.     cholecalciferol 1000 UNITS tablet  Commonly known as:  VITAMIN D  Take 1,000 Units by mouth daily.     docusate sodium 100 MG capsule  Commonly known as:  COLACE  Take 100 mg by mouth daily as needed for mild constipation.     HYDROcodone-acetaminophen 5-325 MG per tablet  Commonly known as:  NORCO/VICODIN   Take 1 tablet by mouth every 6 (six) hours as needed for moderate pain.     mupirocin ointment 2 %  Commonly known as:  BACTROBAN  Place 1 application into the nose 2 (two) times daily.     PRESERVISION AREDS 2 PO  Take 1 tablet by mouth 2 (two) times daily.     vitamin C 500 MG tablet  Commonly known as:  ASCORBIC ACID  Take 500 mg by mouth daily.     vitamin E 400 UNIT capsule  Take 400 Units by mouth daily.        Diagnostic Studies: Ct Abdomen Wo Contrast  07/29/2013   CLINICAL DATA:  Percutaneous nephrolithotomy  EXAM: CT ABDOMEN WITHOUT CONTRAST  TECHNIQUE: Multidetector CT imaging of the abdomen was performed following the standard protocol without IV contrast.  COMPARISON:  07/14/2013.  FINDINGS: Imaging of the abdomen was performed from the lung bases to the iliac crests.  There is a percutaneous nephrostomy tube in place with the distal tip coiled in the renal pelvis. There is been interval development of a large right subcapsular hematoma with substantial mass effect on the right kidney. The 4.4 x 2.6 cm posterior exophytic cyst along the interpolar right kidney has increased in attenuation in the interval, consistent with hemorrhage into the cyst.  The dominant stone seen in the lower pole the right kidney has decreased in the interval. Two fragments persists, the more inferior of the 2 measuring 4 x 7 mm and just cranial to this is a 2 x 4 mm fragment. There is persistence of the right hydroureter seen previously.  No interval change in the left kidney with a central apparent staghorn calculus, as on the recent CT scan.  There is some trace basilar atelectasis bilaterally. The liver and spleen are unremarkable. Stomach is decompressed. Duodenum and pancreas are normal. Layering tiny calcified stones are noted in the gallbladder. No adrenal nodule or mass.  .  No free fluid in the abdomen.  Bone windows reveal no worrisome lytic or sclerotic osseous lesions.  IMPRESSION: Large right  subcapsular hematoma with substantial mass effect on the parenchyma of the right kidney.  Interval decrease in the dominant lower right renal stone with 2 residual fragments identified in the lower pole collecting system.  Right percutaneous nephrostomy catheter with the tip coiled in the renal pelvis.  Persistent right hydroureter, stable.  Cholelithiasis.  These results will be called to the ordering clinician or representative by the Radiologist Assistant, and communication documented in the PACS Dashboard. The   Electronically Signed   By: Misty Stanley M.D.   On: 07/28/2013 08:32   Dg Chest 2 View  07/21/2013   CLINICAL DATA:  PREOP RIGHT PERCUTANEOUS NEPHROLITHOTOMY PACEMAKER -HX SYMPTOMATIC BRADYCARDIA; HYPERTENSION  EXAM: CHEST  2 VIEW  COMPARISON:  DG CHEST 1 VIEW dated 08/16/2011  FINDINGS: The heart size and mediastinal contours are within normal limits. Both lungs are clear. The visualized skeletal structures are unremarkable. A left chest wall cardiac pacing unit.  IMPRESSION: No active cardiopulmonary disease.   Electronically Signed   By: Margaree Mackintosh M.D.   On: 07/21/2013 15:15   Abd 1 View (kub)  07/28/2013   CLINICAL DATA:  Nephrostomy.  Ureteral stone.  EXAM: ABDOMEN - 1 VIEW  COMPARISON:  DG ABDOMEN 1V dated 07/27/2013; IR NEPHROSTOGRAM*R* dated 07/20/2013; CT UROGRAM dated 07/14/2013  FINDINGS: Gas pattern is nonspecific. Large amount of stool is present. Right nephrostomy tube in good anatomic position. Previously identified stone fragment is now within the right lower pelvis. This could be in the distal ureter or the bladder. No definite stone fragments noted over the right kidney however large amount of stool is present makes evaluation of this region difficult. No bony abnormality.  IMPRESSION: Large stone fragment noted over the right lower pelvis. This could be in the distal right ureter or bladder. Right nephrostomy tube in good anatomic position .   Electronically Signed   By: Marcello Moores   Register   On: 07/28/2013 07:12   Dg Abd 1 View  07/27/2013   CLINICAL DATA:  Right renal and ureteral stone.  EXAM: ABDOMEN - 1 VIEW; DG C-ARM 1-60 MIN - NRPT [facility]  COMPARISON:  None.  FINDINGS: Single C-arm image demonstrates a right nephrostomy catheter in the right renal pelvis. Contrast opacifies the proximal right ureter and the renal pelvis and calices.  IMPRESSION: Right percutaneous nephrostomy catheter in place.   Electronically Signed   By: Rozetta Nunnery M.D.   On: 07/27/2013 16:52   Ir Nephrostogram Right  07/20/2013   CLINICAL DATA:  History of bladder cancer, post cystectomy and ileal conduit creation, recently admitted with right-sided obstructive uropathy, post ultrasound fluoroscopic guided percutaneous nephrostomy tube placement (07/14/2013), now presenting for conversion of nephrostomy catheter to a nephro ureteral catheter to maintain percutaneous access for potential future percutaneous nephrolithotomy.  EXAM: 1. RIGHT-SIDED NEPHROSTOGRAM. 2. FLUOROSCOPIC GUIDED CONVERSION OF RIGHT-SIDED NEPHROSTOMY CATHETER TO RIGHT-SIDED NEPHRO URETERAL CATHETER.  COMPARISON:  IR US GUIDE BX ASP/DRAIN dated 07/14/2013  MEDICATIONS: Versed 1.5 mg IV; Fentanyl 50 mcg IV; Rocephin 1 g IV; The antibiotics were administered within 1 hour of the procedure.  ANESTHESIA/SEDATION: Total Moderate Sedation Time  15 minutes.  CONTRAST:  25 cc Omnipaque 300, administered into the right-sided renal collecting system.  FLUOROSCOPY TIME:  3 minutes.  30 seconds.  COMPLICATIONS: None immediate  PROCEDURE: The procedure, risks, benefits, and alternatives were explained to the patient. Questions regarding the procedure were encouraged and answered. The patient understands and consents to the procedure.  The nephrostomy tubes and surrounding skin were prepped with Betadine in a sterile fashion, and a sterile drape was applied covering the operative field. A sterile gown and sterile gloves were used for the procedure. Local  anesthesia was provided with 1% Lidocaine.  The preexisting right-sided nephrostomy tube was injected with contrast material. Fluoroscopic spot images were obtained of the collecting  system and ureter to the level of the ileal conduit. The existing nephrostomy catheter was cut and cannulated with a Benson wire. Under intermittent fluoroscopic guidance, the existing nephrostomy catheter was exchanged for a 9-French vascular sheath which was advanced into the renal pelvis. With the use of a Bentson wire, a Kumpe catheter was advanced through the right ureter and into the right lower quadrant ileal conduit. Contrast injection confirmed appropriate positioning.  The Kumpe catheter was utilized to estimate to appropriate length of the nephro ureteral catheter. Over an Amplatz stiff glide wire and under intermittent fluoroscopic guidance, a 10 French, 22 cm nephro ureteral catheter was then advanced with distal portion formed at the level of the ileal conduit and portion was then formed at the level of the renal collecting system. Note, 22 cm is the shortest nephro ureteral catheter length available. Limited contrast injection confirmed appropriate positioning.  The nephro ureteral catheter was reconnected to a gravity bag (at the patient's family request). The catheter was secured at the skin exit site within interrupted suture. Dressings were placed. The patient tolerated procedure well without immediate postprocedural complication.  FINDINGS: Right-sided antegrade nephrostogram demonstrates the existing right-sided percutaneous nephrostomy catheter is appropriately positioned and functioning. There has been interval resolution of previously noted moderate right-sided pelvicaliectasis.  There is mild irregularity and narrowing involving the distal aspect of the right ureter, however contrast passes through the distal ureter and into the ileal conduit. There is moderate upstream ureterectasis.  Successful fluoroscopic  conversion of right-sided percutaneous nephrostomy to a new 10 French, 22 cm nephro ureteral catheter with inferior coil within the ileal conduit and superior coil within the right renal pelvis.  IMPRESSION: 1. Successful conversion of right-sided percutaneous nephrostomy catheter to a 10 French, 22 cm nephro ureteral catheter with inferior coil within the ileal conduit and superior coil within the renal pelvis. 2. Nonocclusive mild irregular narrowing of the distal aspect of the right ureter regional to the anastomosis, possibly secondary to anastomotic stricture though further evaluation could be performed with direct visualization as clinically indicated.  PLAN: The nephro ureteral catheter was reconnected to a gravity bag at the patient's family request secondary to impending nephrolithotomy procedure scheduled for early next week.   Electronically Signed   By: Sandi Mariscal M.D.   On: 07/20/2013 10:55   Ir Perc Nephrostomy Right  07/15/2013   CLINICAL DATA:  80 year old male with right flank pain, hydronephrosis and fever concerning for obstructed pyonephrosis.  EXAM: PERC NEPHROSTOMY*R*; IR ULTRASOUND GUIDANCE  Date: 07/15/2013  PROCEDURE: 1. Ultrasound guided puncture of the right renal collecting system 2. Placement of a 10 French percutaneous nephrostomy tube with fluoroscopic guidance Interventional Radiologist:  Criselda Peaches, MD  ANESTHESIA/SEDATION: Moderate (conscious) sedation was used. 1 mg Versed, 50 mcg Fentanyl were administered intravenously. The patient's vital signs were monitored continuously by radiology nursing throughout the procedure.  Sedation Time: 15 minutes  FLUOROSCOPY TIME:  36 seconds  CONTRAST:  5 cc Omnipaque administered into the renal collecting system  TECHNIQUE: 2 g Ancef administered IV prior to skin incision.  Informed consent was obtained from the patient following explanation of the procedure, risks, benefits and alternatives. The patient understands, agrees and  consents for the procedure. All questions were addressed. A time out was performed.  Maximal barrier sterile technique utilized including caps, mask, sterile gowns, sterile gloves, large sterile drape, hand hygiene, and Betadine skin prep.  The right flank was interrogated with ultrasound. The right kidney is hydronephrotic. A suitable access  site into in interpolar calyx was identified. Local anesthesia was attained by infiltration with 1% lidocaine. A small dermatotomy was made. Under real-time sonographic guidance, a 21 gauge Accustick needle was advanced through the posterolateral right flank and into the renal collecting system. An image was obtained and stored. Confirmation of needle placement was confirmed by return of turbid, foul-smelling urine.  A gentle hand injection of contrast material through the needle confirmed access in the caliectasis and moderate hydronephrosis. An 0.018 wire was coiled within the renal pelvis an Accustick sheath was advanced. The 0.018 wire was exchanged for a short Amplatz wire. The tract was dilated to 10 [place]. A Cook 10 [place] all-purpose drainage catheter was advanced over the wire and formed with the locking loop in the renal pelvis. There is immediate return of turbid foul smelling urine. The catheter was secured to the skin with 0 Prolene suture and a rubber bumper. The tube was connected to gravity bag drainage.  IMPRESSION: Technically successful placement of a 10 French right-sided percutaneous nephrostomy tube.  A sample of urine was sent for culture and sensitivities.  Following renal decompression and resolution of sepsis symptoms, more dedicated antegrade nephroureterogram can be performed to assess the etiology of the patient's distal ureteral obstruction.  Signed,  Criselda Peaches, MD  [facility]  [facility]   Electronically Signed   By: Jacqulynn Cadet M.D.   On: 07/15/2013 08:41   Ir US Guide Bx  Asp/drain  07/15/2013   CLINICAL DATA:  80 year old male with right flank pain, hydronephrosis and fever concerning for obstructed pyonephrosis.  EXAM: PERC NEPHROSTOMY*R*; IR ULTRASOUND GUIDANCE  Date: 07/15/2013  PROCEDURE: 1. Ultrasound guided puncture of the right renal collecting system 2. Placement of a 10 French percutaneous nephrostomy tube with fluoroscopic guidance Interventional Radiologist:  Criselda Peaches, MD  ANESTHESIA/SEDATION: Moderate (conscious) sedation was used. 1 mg Versed, 50 mcg Fentanyl were administered intravenously. The patient's vital signs were monitored continuously by radiology nursing throughout the procedure.  Sedation Time: 15 minutes  FLUOROSCOPY TIME:  36 seconds  CONTRAST:  5 cc Omnipaque administered into the renal collecting system  TECHNIQUE: 2 g Ancef administered IV prior to skin incision.  Informed consent was obtained from the patient following explanation of the procedure, risks, benefits and alternatives. The patient understands, agrees and consents for the procedure. All questions were addressed. A time out was performed.  Maximal barrier sterile technique utilized including caps, mask, sterile gowns, sterile gloves, large sterile drape, hand hygiene, and Betadine skin prep.  The right flank was interrogated with ultrasound. The right kidney is hydronephrotic. A suitable access site into in interpolar calyx was identified. Local anesthesia was attained by infiltration with 1% lidocaine. A small dermatotomy was made. Under real-time sonographic guidance, a 21 gauge Accustick needle was advanced through the posterolateral right flank and into the renal collecting system. An image was obtained and stored. Confirmation of needle placement was confirmed by return of turbid, foul-smelling urine.  A gentle hand injection of contrast material through the needle confirmed access in the caliectasis and moderate hydronephrosis. An 0.018 wire was coiled within the renal  pelvis an Accustick sheath was advanced. The 0.018 wire was exchanged for a short Amplatz wire. The tract was dilated to 10 [place]. A Cook 10 [place] all-purpose drainage catheter was advanced over the wire and formed with the locking loop in the renal pelvis. There is immediate return of turbid foul smelling urine. The catheter was secured to  the skin with 0 Prolene suture and a rubber bumper. The tube was connected to gravity bag drainage.  IMPRESSION: Technically successful placement of a 10 French right-sided percutaneous nephrostomy tube.  A sample of urine was sent for culture and sensitivities.  Following renal decompression and resolution of sepsis symptoms, more dedicated antegrade nephroureterogram can be performed to assess the etiology of the patient's distal ureteral obstruction.  Signed,  Criselda Peaches, MD  [facility]  [facility]   Electronically Signed   By: Jacqulynn Cadet M.D.   On: 07/15/2013 08:41   Dg C-arm 1-60 Min-no Report  07/27/2013   CLINICAL DATA:  Right renal and ureteral stone.  EXAM: ABDOMEN - 1 VIEW; DG C-ARM 1-60 MIN - NRPT [facility]  COMPARISON:  None.  FINDINGS: Single C-arm image demonstrates a right nephrostomy catheter in the right renal pelvis. Contrast opacifies the proximal right ureter and the renal pelvis and calices.  IMPRESSION: Right percutaneous nephrostomy catheter in place.   Electronically Signed   By: Rozetta Nunnery M.D.   On: 07/27/2013 16:52   Ir Oris Drone Cath Perc Right  07/20/2013   CLINICAL DATA:  History of bladder cancer, post cystectomy and ileal conduit creation, recently admitted with right-sided obstructive uropathy, post ultrasound fluoroscopic guided percutaneous nephrostomy tube placement (07/14/2013), now presenting for conversion of nephrostomy catheter to a nephro ureteral catheter to maintain percutaneous access for potential future percutaneous nephrolithotomy.  EXAM: 1. RIGHT-SIDED  NEPHROSTOGRAM. 2. FLUOROSCOPIC GUIDED CONVERSION OF RIGHT-SIDED NEPHROSTOMY CATHETER TO RIGHT-SIDED NEPHRO URETERAL CATHETER.  COMPARISON:  IR US GUIDE BX ASP/DRAIN dated 07/14/2013  MEDICATIONS: Versed 1.5 mg IV; Fentanyl 50 mcg IV; Rocephin 1 g IV; The antibiotics were administered within 1 hour of the procedure.  ANESTHESIA/SEDATION: Total Moderate Sedation Time  15 minutes.  CONTRAST:  25 cc Omnipaque 300, administered into the right-sided renal collecting system.  FLUOROSCOPY TIME:  3 minutes.  30 seconds.  COMPLICATIONS: None immediate  PROCEDURE: The procedure, risks, benefits, and alternatives were explained to the patient. Questions regarding the procedure were encouraged and answered. The patient understands and consents to the procedure.  The nephrostomy tubes and surrounding skin were prepped with Betadine in a sterile fashion, and a sterile drape was applied covering the operative field. A sterile gown and sterile gloves were used for the procedure. Local anesthesia was provided with 1% Lidocaine.  The preexisting right-sided nephrostomy tube was injected with contrast material. Fluoroscopic spot images were obtained of the collecting system and ureter to the level of the ileal conduit. The existing nephrostomy catheter was cut and cannulated with a Benson wire. Under intermittent fluoroscopic guidance, the existing nephrostomy catheter was exchanged for a 9-French vascular sheath which was advanced into the renal pelvis. With the use of a Bentson wire, a Kumpe catheter was advanced through the right ureter and into the right lower quadrant ileal conduit. Contrast injection confirmed appropriate positioning.  The Kumpe catheter was utilized to estimate to appropriate length of the nephro ureteral catheter. Over an Amplatz stiff glide wire and under intermittent fluoroscopic guidance, a 10 French, 22 cm nephro ureteral catheter was then advanced with distal portion formed at the level of the ileal conduit  and portion was then formed at the level of the renal collecting system. Note, 22 cm is the shortest nephro ureteral catheter length available. Limited contrast injection confirmed appropriate positioning.  The nephro ureteral catheter was reconnected to a gravity bag (at the patient's family request). The catheter was  secured at the skin exit site within interrupted suture. Dressings were placed. The patient tolerated procedure well without immediate postprocedural complication.  FINDINGS: Right-sided antegrade nephrostogram demonstrates the existing right-sided percutaneous nephrostomy catheter is appropriately positioned and functioning. There has been interval resolution of previously noted moderate right-sided pelvicaliectasis.  There is mild irregularity and narrowing involving the distal aspect of the right ureter, however contrast passes through the distal ureter and into the ileal conduit. There is moderate upstream ureterectasis.  Successful fluoroscopic conversion of right-sided percutaneous nephrostomy to a new 10 French, 22 cm nephro ureteral catheter with inferior coil within the ileal conduit and superior coil within the right renal pelvis.  IMPRESSION: 1. Successful conversion of right-sided percutaneous nephrostomy catheter to a 10 French, 22 cm nephro ureteral catheter with inferior coil within the ileal conduit and superior coil within the renal pelvis. 2. Nonocclusive mild irregular narrowing of the distal aspect of the right ureter regional to the anastomosis, possibly secondary to anastomotic stricture though further evaluation could be performed with direct visualization as clinically indicated.  PLAN: The nephro ureteral catheter was reconnected to a gravity bag at the patient's family request secondary to impending nephrolithotomy procedure scheduled for early next week.   Electronically Signed   By: Sandi Mariscal M.D.   On: 07/20/2013 10:55    Disposition: 01-Home or Self Care       Discharge Orders   Future Appointments Provider Department Dept Phone   05/16/2014 2:15 PM Hayden Pedro, MD [place] [phone]   Future Orders Complete By Expires   Discontinue IV  As directed       Follow-up Information   Follow up with Malka So, MD On 08/03/2013. (My office will call about the x-ray study)    Specialty:  Urology   Contact information:   [facility]  [place] [address] [phone]        Signed: Irine Seal 07/28/2013, 12:01 PM

## 2013-07-28 NOTE — Progress Notes (Signed)
07/28/13 1418  PT Time Calculation  PT Start Time 1341  PT Stop Time 1406  PT Time Calculation (min) 25 min  PT G-Codes **NOT FOR INPATIENT CLASS**  Functional Assessment Tool Used clinical judgement  Functional Limitation Mobility: Walking and moving around  Mobility: Walking and Moving Around Current Status [id number]) CK  Mobility: Walking and Moving Around Goal Status [id number]) CI  PT General Charges  $$ ACUTE PT VISIT 1 Procedure  PT Evaluation  $Initial PT Evaluation Tier I 1 Procedure  PT Treatments  $Therapeutic Activity 23-37 mins  Weston Anna, PT [phone]

## 2013-07-28 NOTE — Evaluation (Signed)
Physical Therapy Evaluation Patient Details Name: [name] [name] MRN: [id number] DOB: Mar 28, 1921 Today's Date: 07/28/2013   History of Present Illness  80 yo male admitted with renal stone. s/p percutaneous nephrolithiotomy 5/5.   Clinical Impression  On eval, pt required Mod assist (+2 at times) for mobility-only able to tolerate sitting EOB for 8-10 minutes. Demonstrates general weakness,decreased activity tolerance, and impaired gait and balance. Unable to safely attempt ambulation at time of evaluation. Pt is not safe to d/c home on today-made RN aware. Will plan to check back on tomorrow to further assess mobility, if able. Recommend HHPT vs SNF ,depending on progress.     Follow Up Recommendations Home health PT;SNF;Supervision/Assistance - 24 hour (depending on progress)    Equipment Recommendations  None recommended by PT    Recommendations for Other Services OT consult     Precautions / Restrictions Precautions Precautions: Fall Precaution Comments: multiple drains Restrictions Weight Bearing Restrictions: No      Mobility  Bed Mobility Overal bed mobility: Needs Assistance Bed Mobility: Supine to Sit;Sit to Supine     Supine to sit: Mod assist;HOB elevated Sit to supine: Mod assist;+2 for physical assistance;+2 for safety/equipment   General bed mobility comments: Increased time. Assist for trunk and bil LEs. +2 assist for back to bed.   Transfers                 General transfer comment: Unable to attempt due to weakness, vomiting. Pt requested to return back to bed  Ambulation/Gait                Stairs            Wheelchair Mobility    Modified Rankin (Stroke Patients Only)       Balance Overall balance assessment: Needs assistance Sitting-balance support: Bilateral upper extremity supported;Feet supported Sitting balance-Leahy Scale: Poor Sitting balance - Comments: Sat EOB ~8-10 minutes-initially with multiple instances of  LOB posteriorly x 3. With time able to progress to Min guard assist with bil UE support. Fatigues easily.  Postural control: Posterior lean                                   Pertinent Vitals/Pain Pt denied pain    Home Living Family/patient expects to be discharged to:: Private residence Living Arrangements: Children Available Help at Discharge: Family Type of Home: House Home Access: Stairs to enter Entrance Stairs-Rails: Right Entrance Stairs-Number of Steps: 3 steps Home Layout: Two level;Able to live on main level with bedroom/bathroom Home Equipment: Walker - 2 wheels      Prior Function Level of Independence: Independent         Comments: household ambulation mostly. 2 procedures and 1 surgery in 2 weeks.      Hand Dominance        Extremity/Trunk Assessment   Upper Extremity Assessment: Generalized weakness           Lower Extremity Assessment: Generalized weakness      Cervical / Trunk Assessment: Normal  Communication   Communication: No difficulties  Cognition Arousal/Alertness: Lethargic Behavior During Therapy: WFL for tasks assessed/performed Overall Cognitive Status: Impaired/Different from baseline Area of Impairment: Following commands       Following Commands: Follows one step commands with increased time            General Comments      Exercises  Assessment/Plan    PT Assessment Patient needs continued PT services  PT Diagnosis Difficulty walking;Generalized weakness   PT Problem List Decreased strength;Decreased activity tolerance;Decreased balance;Decreased mobility;Pain;Decreased knowledge of use of DME;Decreased cognition  PT Treatment Interventions DME instruction;Gait training;Functional mobility training;Therapeutic activities;Therapeutic exercise;Patient/family education;Balance training   PT Goals (Current goals can be found in the Care Plan section) Acute Rehab PT Goals Patient Stated Goal:  return home per family member PT Goal Formulation: With family Time For Goal Achievement: 08/11/13 Potential to Achieve Goals: Good    Frequency Min 3X/week   Barriers to discharge        Co-evaluation               End of Session Equipment Utilized During Treatment: Gait belt Activity Tolerance: Patient limited by fatigue Patient left: with call bell/phone within reach;with family/visitor present Nurse Communication: Mobility status (vomiting)         Time: 1341-1406 PT Time Calculation (min): 25 min   Charges:   PT Evaluation $Initial PT Evaluation Tier I: 1 Procedure PT Treatments $Therapeutic Activity: 23-37 mins   PT G Codes:          Weston Anna, MPT Pager: [phone]

## 2013-07-29 ENCOUNTER — Encounter (HOSPITAL_COMMUNITY): Payer: Self-pay | Admitting: Urology

## 2013-07-29 ENCOUNTER — Inpatient Hospital Stay (HOSPITAL_COMMUNITY): Payer: Medicare Other

## 2013-07-29 DIAGNOSIS — E871 Hypo-osmolality and hyponatremia: Secondary | ICD-10-CM

## 2013-07-29 DIAGNOSIS — N189 Chronic kidney disease, unspecified: Secondary | ICD-10-CM | POA: Diagnosis present

## 2013-07-29 DIAGNOSIS — I359 Nonrheumatic aortic valve disorder, unspecified: Secondary | ICD-10-CM

## 2013-07-29 DIAGNOSIS — R509 Fever, unspecified: Secondary | ICD-10-CM

## 2013-07-29 DIAGNOSIS — N179 Acute kidney failure, unspecified: Secondary | ICD-10-CM

## 2013-07-29 DIAGNOSIS — N184 Chronic kidney disease, stage 4 (severe): Secondary | ICD-10-CM | POA: Diagnosis present

## 2013-07-29 LAB — URINALYSIS, ROUTINE W REFLEX MICROSCOPIC
BILIRUBIN URINE: NEGATIVE
GLUCOSE, UA: NEGATIVE mg/dL
KETONES UR: NEGATIVE mg/dL
Nitrite: NEGATIVE
Protein, ur: 100 mg/dL — AB
Specific Gravity, Urine: 1.011 (ref 1.005–1.030)
Urobilinogen, UA: 0.2 mg/dL (ref 0.0–1.0)
pH: 6 (ref 5.0–8.0)

## 2013-07-29 LAB — CBC WITH DIFFERENTIAL/PLATELET
BASOS ABS: 0 10*3/uL (ref 0.0–0.1)
Basophils Relative: 0 % (ref 0–1)
EOS PCT: 1 % (ref 0–5)
Eosinophils Absolute: 0.2 10*3/uL (ref 0.0–0.7)
HCT: 28.2 % — ABNORMAL LOW (ref 39.0–52.0)
HEMOGLOBIN: 9.2 g/dL — AB (ref 13.0–17.0)
LYMPHS ABS: 1.4 10*3/uL (ref 0.7–4.0)
LYMPHS PCT: 7 % — AB (ref 12–46)
MCH: 31.1 pg (ref 26.0–34.0)
MCHC: 32.6 g/dL (ref 30.0–36.0)
MCV: 95.3 fL (ref 78.0–100.0)
MONO ABS: 1.5 10*3/uL — AB (ref 0.1–1.0)
MONOS PCT: 8 % (ref 3–12)
NEUTROS ABS: 16.3 10*3/uL — AB (ref 1.7–7.7)
Neutrophils Relative %: 84 % — ABNORMAL HIGH (ref 43–77)
Platelets: 176 10*3/uL (ref 150–400)
RBC: 2.96 MIL/uL — AB (ref 4.22–5.81)
RDW: 13.5 % (ref 11.5–15.5)
WBC: 19.3 10*3/uL — AB (ref 4.0–10.5)

## 2013-07-29 LAB — URINE MICROSCOPIC-ADD ON

## 2013-07-29 LAB — COMPREHENSIVE METABOLIC PANEL
ALK PHOS: 85 U/L (ref 39–117)
ALT: 16 U/L (ref 0–53)
AST: 34 U/L (ref 0–37)
Albumin: 2.4 g/dL — ABNORMAL LOW (ref 3.5–5.2)
BUN: 47 mg/dL — AB (ref 6–23)
CALCIUM: 7.9 mg/dL — AB (ref 8.4–10.5)
CO2: 22 meq/L (ref 19–32)
Chloride: 95 mEq/L — ABNORMAL LOW (ref 96–112)
Creatinine, Ser: 4.67 mg/dL — ABNORMAL HIGH (ref 0.50–1.35)
GFR, EST AFRICAN AMERICAN: 12 mL/min — AB (ref >90)
GFR, EST NON AFRICAN AMERICAN: 10 mL/min — AB (ref >90)
GLUCOSE: 121 mg/dL — AB (ref 70–99)
Potassium: 4.9 mEq/L (ref 3.7–5.3)
SODIUM: 131 meq/L — AB (ref 137–147)
Total Bilirubin: 0.2 mg/dL — ABNORMAL LOW (ref 0.3–1.2)
Total Protein: 6.3 g/dL (ref 6.0–8.3)

## 2013-07-29 MED ORDER — PIPERACILLIN-TAZOBACTAM IN DEX 2-0.25 GM/50ML IV SOLN
2.2500 g | INTRAVENOUS | Status: AC
  Administered 2013-07-29: 2.25 g via INTRAVENOUS
  Filled 2013-07-29: qty 50

## 2013-07-29 MED ORDER — PIPERACILLIN-TAZOBACTAM IN DEX 2-0.25 GM/50ML IV SOLN
2.2500 g | Freq: Four times a day (QID) | INTRAVENOUS | Status: DC
  Administered 2013-07-30 – 2013-08-01 (×10): 2.25 g via INTRAVENOUS
  Filled 2013-07-29 (×12): qty 50

## 2013-07-29 MED ORDER — SODIUM CHLORIDE 0.9 % IV BOLUS (SEPSIS): 250.0000 mL | Freq: Once | INTRAVENOUS | Status: DC

## 2013-07-29 MED ORDER — SODIUM CHLORIDE 0.9 % IV SOLN
INTRAVENOUS | Status: DC
  Administered 2013-07-30 – 2013-08-01 (×6): via INTRAVENOUS

## 2013-07-29 NOTE — Care Management Note (Addendum)
    Page 1 of 2   08/04/2013     12:11:59 PM CARE MANAGEMENT NOTE 08/04/2013  Patient:  [name] [name], [name] [name]   Account Number:  [id number]  Date Initiated:  07/29/2013  Documentation initiated by:  [facility]  Subjective/Objective Assessment:   adm: bilateral nephrolithiasis; conversion of R nephrostomy tube to nephroureteral cathter to aid in future antegrade ureteroscopy     Action/Plan:   discharge planning   Anticipated DC Date:  08/04/2013   Anticipated DC Plan:  [place]  CM consult      Choice offered to / List presented to:  C-4 Adult Children        HH arranged  [place].   Status of service:  Completed, signed off Medicare Important Message given?  YES (If response is "NO", the following Medicare IM given date fields will be blank) Date Medicare IM given:  08/02/2013 Date Additional Medicare IM given:    Discharge Disposition:  [place]  Per UR Regulation:  Reviewed for med. necessity/level of care/duration of stay  If discussed at [place] of Stay Meetings, dates discussed:   08/03/2013    Comments:  08/04/13 Laurencia Roma RN,BSN NCM [phone] FOR D/C TODAY.[facility] ALREADY HAVE HHPT/OT ORDERS.NO FURTHER D/C NEEDS.  08/03/13 Cohan Stipes RN,BSN NCM [phone] [facility] CHOSEN FOR HH.D/C HOME W/[facility] HHPT/OT.[facility] REP KRISTEN AWARE OF D/C,& HHC ORDERS.  08/02/13 Tomaz Janis RN,BSN NCM [phone] PT/OT-HH,3N1.WILL PROVIDE HHC LIST.AWAIT FINAL HHPT/OT,3N1 ORDER.FOR POSSIBLE URETEROILEAL STENT,OR STENT REMOVAL.  07/30/14 12:45 Cm met with pt in room and spoke with son of pt, [name].  Pt sleeping.  Audry Pili states pt has been staying with his sister, pt's daughter, [name], [phone]. He states his father will most probably choose Home to SNF; though pt lives with daughter, daughter works and family needs to discuss the reccomended 24-hour supervision.  Will  Continue to monitor for choice.  Mariane Masters, BSN, CM [phone].

## 2013-07-29 NOTE — Progress Notes (Signed)
ANTIBIOTIC CONSULT NOTE - INITIAL  Pharmacy Consult for Zosyn  Indication: post percutaneous nephrolithotomy fever   No Known Allergies  Patient Measurements: Height: 5\' 8"  (172.7 cm) Weight: 168 lb (76.204 kg) IBW/kg (Calculated) : 68.4   Vital Signs: Temp: 102.5 F (39.2 C) (05/07 1428) Temp src: Oral (05/07 1428) BP: 126/56 mmHg (05/07 1428) Pulse Rate: 74 (05/07 1428) Intake/Output from previous day: 05/06 0701 - 05/07 0700 In: 600 [P.O.:600] Out: 100 [Urine:100] Intake/Output from this shift: Total I/O In: 2707.5 [P.O.:360; I.V.:2347.5] Out: 110 [Urine:110]  Labs:  Recent Labs  07/28/13 0635  HGB 10.7*  CREATININE 2.54*   Estimated Creatinine Clearance: 19.1 ml/min (by C-G formula based on Cr of 2.54). No results found for this basename: VANCOTROUGH, Corlis Leak, VANCORANDOM, GENTTROUGH, GENTPEAK, GENTRANDOM, TOBRATROUGH, TOBRAPEAK, TOBRARND, AMIKACINPEAK, AMIKACINTROU, AMIKACIN,  in the last 72 hours   Microbiology: Recent Results (from the past 720 hour(s))  URINE CULTURE     Status: None   Collection Time    07/14/13  7:00 PM      Result Value Ref Range Status   Specimen Description KIDNEY   Final   Special Requests Cefazolin   Final   Culture  Setup Time     Final   Value: 07/14/2013 22:43     Performed at [facility]     Final   Value: >=100,000 COLONIES/ML     Performed at [facility]   Culture     Final   Value: Multiple bacterial morphotypes present, none predominant. Suggest appropriate recollection if clinically indicated.     Performed at [facility]   Report Status 07/15/2013 FINAL   Final  URINE CULTURE     Status: None   Collection Time    07/14/13  9:42 PM      Result Value Ref Range Status   Specimen Description URINE, CATHETERIZED   Final   Special Requests NONE   Final   Culture  Setup Time     Final   Value: 07/15/2013 04:24     Performed at [place]     Final    Value: >=100,000 COLONIES/ML     Performed at [facility]   Culture     Final   Value: Shafer     Performed at [facility]   Report Status 07/18/2013 FINAL   Final   Organism ID, Bacteria ESCHERICHIA COLI   Final   Organism ID, Bacteria PROTEUS MIRABILIS   Final    Medical History: Past Medical History  Diagnosis Date  . Hypertension   . Aortic valve stenosis   . Gout   . Hx of bladder cancer   . Deep venous thrombosis     hx of  LEFT ARM AFTER PACEMAKER INSERTION ABOUT 6 YRS AGO  . Osteoporosis   . Renal failure   . Heart murmur   . Depression   . Chronic kidney disease   . Kidney stone   . Prostate cancer     AND BLADDER CANCER - S/P URETEROILEAL CONDUIT  . Bradycardia     HX OF SYMPTOMATIC BRADYCARDIA - STATUS POST PERMANENT PACEMAKER  . S/P ileal conduit     HX BLADDER AND PROSTATE CANCER  . Macular degeneration     PT STATES HIS VISION STILL OKAY FOR DRIVING  . 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 GREGG TAYLOR -ELECTROPHYSIOLOGIST  .  Hydronephrosis with obstructing calculus     RIGHT SIDE - HOSPITALIZED 07-14-13 OT 07-17-13    Medications:  Scheduled:  . acidophilus  1 capsule Oral Daily  . amLODipine  2.5 mg Oral q morning - 10a  . cholecalciferol  1,000 Units Oral Daily  . docusate sodium  100 mg Oral BID  . mupirocin ointment  1 application Nasal BID  . piperacillin-tazobactam (ZOSYN)  IV  2.25 g Intravenous NOW  . [START ON 07/30/2013] piperacillin-tazobactam (ZOSYN)  IV  2.25 g Intravenous 4 times per day  . vitamin C  500 mg Oral Daily  . vitamin E  400 Units Oral Daily   Infusions:  . dextrose 5 % and 0.45% NaCl 75 mL/hr at 07/29/13 1318   PRN: acetaminophen, diphenhydrAMINE, diphenhydrAMINE, docusate sodium, HYDROcodone-acetaminophen, HYDROcodone-acetaminophen, HYDROmorphone (DILAUDID) injection, ondansetron Assessment:  80 y.o. male who was admitted 07/27/2013 with a diagnosis of  right renal and ureteral stones and went to the operating room on 07/27/2013 for R percutaneous nephrolithotomy.  He has since developed a post op fever and will start Zosyn per pharmacy   Elevated Scr (2.54) noted, with estimated CrCl 19 ml/min   Temp 102.5  Blood cultures sent   Goal of Therapy:  Zosyn per renal function   Plan:  1.) Zosyn 2.25 grams IV q6h, first dose now 2.) Monitor renal function   Gaye Alken Alecsander Hattabaugh PharmD Pager #: [phone] 4:57 PM 07/29/2013

## 2013-07-29 NOTE — Progress Notes (Signed)
Physical Therapy Treatment Patient Details Name: [name] [name] MRN: [id number] DOB: [birth year] Today's Date: 07/29/2013    History of Present Illness 80 yo male admitted with renal stone. s/p percutaneous nephrolithiotomy 5/5.     PT Comments    Progressing slowly with mobility. Continues to require Mod-Max assist for mobility (+2 for safety at times). May need to consider ST placement if family cannot manage with pt at current level and if he will not have 24/7 supervision.   Follow Up Recommendations  Home health PT;Supervision/Assistance - 24 hour;SNF     Equipment Recommendations  None recommended by PT    Recommendations for Other Services OT consult     Precautions / Restrictions Precautions Precautions: Fall Precaution Comments: multiple drains Restrictions Weight Bearing Restrictions: No    Mobility  Bed Mobility Overal bed mobility: Needs Assistance Bed Mobility: Supine to Sit;Sit to Supine     Supine to sit: Mod assist;HOB elevated Sit to supine: HOB elevated;Max assist   General bed mobility comments: Increased time. Assist for trunk and bil LEs. Utilized bedpad for scooting, positioning  Transfers Overall transfer level: Needs assistance Equipment used: Rolling walker (2 wheeled) Transfers: Sit to/from Stand Sit to Stand: From elevated surface;Mod assist         General transfer comment: Assist to rise, stabilize, control descent. Multimodal cues for safety, technique, hand placement  Ambulation/Gait Ambulation/Gait assistance: Max assist Ambulation Distance (Feet): 100 Feet Assistive device: Rolling walker (2 wheeled) Gait Pattern/deviations: Step-through pattern;Decreased stride length;Decreased step length - right;Trunk flexed     General Gait Details: Assist to stabilize/support pt and maneuver with walker. R foot appeared to lag behind throughout most of ambulation distance.    Stairs            Wheelchair Mobility    Modified  Rankin (Stroke Patients Only)       Balance Overall balance assessment: Needs assistance Sitting-balance support: Feet supported;Bilateral upper extremity supported Sitting balance-Leahy Scale: Fair     Standing balance support: Bilateral upper extremity supported;During functional activity Standing balance-Leahy Scale: Poor                      Cognition Arousal/Alertness: Awake/alert Behavior During Therapy: WFL for tasks assessed/performed Overall Cognitive Status: Within Functional Limits for tasks assessed                      Exercises      General Comments        Pertinent Vitals/Pain Pt denied pain    Home Living                      Prior Function            PT Goals (current goals can now be found in the care plan section) Progress towards PT goals: Progressing toward goals (slowly)    Frequency  Min 3X/week    PT Plan Current plan remains appropriate    Co-evaluation             End of Session Equipment Utilized During Treatment: Gait belt Activity Tolerance: Patient limited by fatigue Patient left: in bed;with call bell/phone within reach     Time: [id number] PT Time Calculation (min): 12 min  Charges:  $Gait Training: 8-22 mins                    G Codes:      Weston Anna, MPT Pager:  [phone]   

## 2013-07-29 NOTE — Progress Notes (Addendum)
Patient ID: [name] [name], male   DOB: 1922-02-12, 80 y.o.   MRN: [id number]  He spiked a temp up to 102.5 again this afternoon.  He has no specific complaints but has some mild edema in his hands.   I am going to get cultures, labs and a CXR and change him to Zosyn pending the cultures.  He will need to be made inpatient status.    He has only had about 110cc of urine output today total.      I am going to get the hospitalists involved to help manage his fluids.

## 2013-07-29 NOTE — Evaluation (Signed)
Occupational Therapy Evaluation Patient Details Name: [name] [name] MRN: [id number] DOB: 12/19/1921 Today's Date: 07/29/2013    History of Present Illness 80 yo male admitted with renal stone. s/p percutaneous nephrolithiotomy 5/5.    Clinical Impression   Pt presents to OT with decreased I with ADL activity and will benefit from skilled OT to address problems listed below.    Follow Up Recommendations  Home health OT;Supervision/Assistance - 24 hour;SNF;Other (comment) (depending on family A)          Precautions / Restrictions Precautions Precautions: Fall Precaution Comments: multiple drains Restrictions Weight Bearing Restrictions: No      Mobility Bed Mobility Overal bed mobility: Needs Assistance Bed Mobility: Supine to Sit;Sit to Supine     Supine to sit: Mod assist;HOB elevated Sit to supine: HOB elevated;Max assist   General bed mobility comments: Increased time. Assist for trunk and bil LEs. Utilized bedpad for scooting, positioning  Transfers Overall transfer level: Needs assistance Equipment used: Rolling walker (2 wheeled) Transfers: Sit to/from Stand Sit to Stand: From elevated surface;Mod assist         General transfer comment: Assist to rise, stabilize, control descent. Multimodal cues for safety, technique, hand placement    Balance Overall balance assessment: Needs assistance Sitting-balance support: Feet supported;Bilateral upper extremity supported Sitting balance-Leahy Scale: Fair     Standing balance support: Bilateral upper extremity supported;During functional activity Standing balance-Leahy Scale: Poor                              ADL Overall ADL's : Needs assistance/impaired     Grooming: Wash/dry face;Sitting;Minimal assistance   Upper Body Bathing: Moderate assistance;Sitting   Lower Body Bathing: Maximal assistance;Sitting/lateral leans   Upper Body Dressing : Sitting;Moderate assistance   Lower Body  Dressing: Sitting/lateral leans;Maximal assistance                 General ADL Comments: Pt fatigued this afternoon - but did agree to sit EOB.  Pt needed significant A initiallty with sitting balance- but then was able to progress to  min A with sitting balance.  OT did note a leak under gown and called RN.  Pts ostomy bag leaking and RN needed to fix.  Pt returned to supine.  Pt reported fatigue but no pain.      Extremity/Trunk Assessment Upper Extremity Assessment Upper Extremity Assessment: Generalized weakness           Communication Communication Communication: No difficulties   Cognition Arousal/Alertness: Awake/alert Behavior During Therapy: WFL for tasks assessed/performed Overall Cognitive Status: Within Functional Limits for tasks assessed                                Home Living Family/patient expects to be discharged to:: Private residence Living Arrangements: Children Available Help at Discharge: Family Type of Home: House Home Access: Stairs to enter [employer] of Steps: 3 steps Entrance Stairs-Rails: Right Home Layout: Two level;Able to live on main level with bedroom/bathroom     Bathroom Shower/Tub: Walk-in shower   Bathroom Toilet: Handicapped height     Home Equipment: Environmental consultant - 2 wheels          Prior Functioning/Environment Level of Independence: Independent        Comments: household ambulation mostly. 2 procedures and 1 surgery in 2 weeks.     OT Diagnosis: Generalized weakness  OT Problem List: Decreased strength;Decreased activity tolerance;Impaired balance (sitting and/or standing)   OT Treatment/Interventions: Self-care/ADL training;DME and/or AE instruction;Patient/family education    OT Goals(Current goals can be found in the care plan section) Acute Rehab OT Goals Patient Stated Goal: i hope i am able to garden a little bit OT Goal Formulation: With patient Time For Goal Achievement:  08/12/13 Potential to Achieve Goals: Good ADL Goals Pt Will Perform Grooming: standing;with supervision Pt Will Transfer to Toilet: with min assist;bedside commode Pt Will Perform Toileting - Clothing Manipulation and hygiene: with min assist;sit to/from stand  OT Frequency: Min 2X/week   Barriers to D/C:            Co-evaluation              End of Session    Activity Tolerance: Patient limited by fatigue Patient left: in bed with RN present and call bell within reach   Time: 1330-1358 OT Time Calculation (min): 28 min Charges:  OT General Charges $OT Visit: 1 Procedure OT Evaluation $Initial OT Evaluation Tier I: 1 Procedure OT Treatments $Self Care/Home Management : 8-22 mins G-Codes: OT G-codes **NOT FOR INPATIENT CLASS** Functional Assessment Tool Used: clinical observation Functional Limitation: Self care Self Care Current Status [id number]): At least 60 percent but less than 80 percent impaired, limited or restricted Self Care Goal Status [id number]): At least 1 percent but less than 20 percent impaired, limited or restricted  Betsy Pries 07/29/2013, 2:08 PM

## 2013-07-29 NOTE — Progress Notes (Signed)
UR completed. Patient changed to inpatient- requiring IV antibiotics- fever overnight

## 2013-07-29 NOTE — Consult Note (Signed)
[facility] Medical Consultation  [name] [name] IOM:[id number] DOB: Aug 05, 1921 DOA: 07/27/2013   PCP: Jerlyn Ly, MD    Requesting physician: Dr. Irine Seal Date of consultation: 07/29/2013 Reason for consultation: Fluid management and evaluation of fever  Chief Complaint: Fever  HPI: [name] [name] is a 80 y.o. male  with a past medical history of pacemaker placement for symptomatic bradycardia, history of mild to moderate aortic stenosis by echocardiogram in 2013, hypertension, who has had urological issues for a long time. He had bladder cancer about 9-10 years ago, requiring resection of his urinary bladder with placement of ileal conduit. He's had kidney stones. He's had multiple urological procedures in the last 2-3 weeks including percutaneous nephrostomy tube placement for obstructive hydronephrosis secondary to stone. This was done by interventional radiology. Subsequently the tube was internalized. In order to definitely manage his renal calculus, which is causing obstruction, he was brought into the hospital earlier this week and underwent another urological procedure. He has been on Keflex for the past few days. Urine cultures from April 22 showed Proteus mirabilis and E coli. Proteus was not sensitive to cefazolin. Patient starting spiking fever last night. His temperature went up to 102.23F. He denies any dizziness or lightheadedness. He did have a few episodes of nausea and vomiting yesterday and reports poor appetite today. Denies any diarrhea. No cough. No headache. No facial pain. No joint pains. No skin rashes. He was having back pain at the site of his PCN tube placement. However, he no longer has that pain. Overall, patient feels weak, but otherwise denies any specific complaints.   Home Medications: Prior to Admission medications   Medication Sig Start Date End Date Taking? Authorizing Provider  acidophilus (RISAQUAD) CAPS capsule Take 1 capsule by mouth  daily.   Yes Historical Provider, MD  amLODipine (NORVASC) 2.5 MG tablet Take 2.5 mg by mouth every morning.  09/14/12  Yes Historical Provider, MD  aspirin 81 MG tablet Take 1 tablet (81 mg total) by mouth daily. 09/08/11  Yes Fredricka Bonine, MD  cholecalciferol (VITAMIN D) 1000 UNITS tablet Take 1,000 Units by mouth daily.   Yes Historical Provider, MD  docusate sodium (COLACE) 100 MG capsule Take 100 mg by mouth daily as needed for mild constipation.    Yes Historical Provider, MD  Multiple Vitamins-Minerals (PRESERVISION AREDS 2 PO) Take 1 tablet by mouth 2 (two) times daily.   Yes Historical Provider, MD  mupirocin ointment (BACTROBAN) 2 % Place 1 application into the nose 2 (two) times daily.   Yes Historical Provider, MD  vitamin C (ASCORBIC ACID) 500 MG tablet Take 500 mg by mouth daily.   Yes Historical Provider, MD  vitamin E 400 UNIT capsule Take 400 Units by mouth daily.   Yes Historical Provider, MD  cephALEXin (KEFLEX) 250 MG capsule Take 1 capsule (250 mg total) by mouth at bedtime. 07/28/13   Irine Seal, MD  HYDROcodone-acetaminophen (NORCO/VICODIN) 5-325 MG per tablet Take 1 tablet by mouth every 6 (six) hours as needed for moderate pain. 07/28/13   Irine Seal, MD   Current inpatient medication list has been reviewed  Allergies: No Known Allergies  Past Medical History: Past Medical History  Diagnosis Date  . Hypertension   . Aortic valve stenosis   . Gout   . Hx of bladder cancer   . Deep venous thrombosis     hx of  LEFT ARM AFTER PACEMAKER INSERTION ABOUT 6 YRS AGO  . Osteoporosis   .  Renal failure   . Heart murmur   . Depression   . Chronic kidney disease   . Kidney stone   . Prostate cancer     AND BLADDER CANCER - S/P URETEROILEAL CONDUIT  . Bradycardia     HX OF SYMPTOMATIC BRADYCARDIA - STATUS POST PERMANENT PACEMAKER  . S/P ileal conduit     HX BLADDER AND PROSTATE CANCER  . Macular degeneration     PT STATES HIS VISION STILL OKAY FOR DRIVING  .  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 GREGG TAYLOR -ELECTROPHYSIOLOGIST  . Hydronephrosis with obstructing calculus     RIGHT SIDE - HOSPITALIZED 07-14-13 OT 07-17-13    Past Surgical History  Procedure Laterality Date  . Insert / replace / remove pacemaker    . Cystectomy    . Lymphadenectomy    . Prostate surgery    . Appendectomy    . Cataract extraction, bilateral    . Kidney stone surgery    . Ilial conduit      for bladder cancer  . Nephrolithotomy  09/02/2011    Procedure: NEPHROLITHOTOMY PERCUTANEOUS;  Surgeon: Malka So, MD;  Location: [facility];  Service: Urology;  Laterality: Left;  . 07/20/13  conversion of right sided nephrostomy catheter to right sided nephro ureteral catheter - done in interventional radiology    . Nephrolithotomy Right 07/27/2013    Procedure: RIGHT PERCUTANEOUS NEPHROLITHOTOMY ;  Surgeon: Irine Seal, MD;  Location: [facility];  Service: Urology;  Laterality: Right;    Social History:  Patient usually lives by himself. He is quite active. His son lives close by. No history of smoking or alcohol use.  Family History:  Family History  Problem Relation Age of Onset  . Prostate cancer Father 41    died  . Pancreatic cancer Mother 42    died     Review of Systems - History obtained from the patient General ROS: positive for  - fatigue Psychological ROS: negative Ophthalmic ROS: negative ENT ROS: negative Allergy and Immunology ROS: negative Hematological and Lymphatic ROS: negative Endocrine ROS: negative Respiratory ROS: no cough, shortness of breath, or wheezing Cardiovascular ROS: no chest pain or dyspnea on exertion Gastrointestinal ROS: as in hpi Genito-Urinary ROS: as in hpi Musculoskeletal ROS: negative Neurological ROS: no TIA or stroke symptoms Dermatological ROS: negative  Physical Examination: Filed Vitals:   07/28/13 2110 07/28/13 2150 07/29/13 0545 07/29/13 1428  BP: 119/47  118/54 126/56  Pulse: 72  75 74  Temp:  102.3 F (39.1 C) 98.7 F (37.1 C) 98.2 F (36.8 C) 102.5 F (39.2 C)  TempSrc: Oral Oral Oral Oral  Resp: $Remo'20  18 18  'zAtyL$ Height:      Weight:      SpO2: 96%  99% 94%    General appearance: alert, cooperative, appears stated age, no distress and moderately obese Head: Normocephalic, without obvious abnormality, atraumatic Eyes: conjunctivae/corneas clear. PERRL, EOM's intact. Throat: lips, mucosa, and tongue normal; teeth and gums normal and slightly dy mm Neck: no adenopathy, no carotid bruit, no JVD, supple, symmetrical, trachea midline and thyroid not enlarged, symmetric, no tenderness/mass/nodules Back: symmetric, no curvature. ROM normal. No CVA tenderness., dressing over PCN tube noted draining bloody urine Resp: clear to auscultation bilaterally Cardio: regular rate and rhythm, S1, S2 normal, no S3 or S4, systolic murmur: early systolic 3/6, crescendo throughout the precordium, no click and no rub GI: soft, non-tender; bowel sounds normal; no masses,  no organomegaly Extremities: extremities  normal, atraumatic, no cyanosis or edema Pulses: 2+ and symmetric Skin: Skin color, texture, turgor normal. No rashes or lesions Lymph nodes: Cervical, supraclavicular, and axillary nodes normal. Neurologic: No focal deficits noted.  Laboratory Data: Results for orders placed during the hospital encounter of 07/27/13 (from the past 48 hour(s))  HEMOGLOBIN AND HEMATOCRIT, BLOOD     Status: Abnormal   Collection Time    07/28/13  6:35 AM      Result Value Ref Range   Hemoglobin 10.7 (*) 13.0 - 17.0 g/dL   HCT 25.5 (*) 57.8 - 30.5 %  BASIC METABOLIC PANEL     Status: Abnormal   Collection Time    07/28/13  6:35 AM      Result Value Ref Range   Sodium 135 (*) 137 - 147 mEq/L   Potassium 5.3  3.7 - 5.3 mEq/L   Chloride 99  96 - 112 mEq/L   CO2 25  19 - 32 mEq/L   Glucose, Bld 112 (*) 70 - 99 mg/dL   BUN 33 (*) 6 - 23 mg/dL   Creatinine, Ser 2.81 (*) 0.50 - 1.35 mg/dL   Calcium 8.2 (*)  8.4 - 10.5 mg/dL   GFR calc non Af Amer 21 (*) >90 mL/min   GFR calc Af Amer 24 (*) >90 mL/min   Comment: (NOTE)     The eGFR has been calculated using the CKD EPI equation.     This calculation has not been validated in all clinical situations.     eGFR's persistently <90 mL/min signify possible Chronic Kidney     Disease.  CBC WITH DIFFERENTIAL     Status: Abnormal   Collection Time    07/29/13  5:25 PM      Result Value Ref Range   WBC 19.3 (*) 4.0 - 10.5 K/uL   RBC 2.96 (*) 4.22 - 5.81 MIL/uL   Hemoglobin 9.2 (*) 13.0 - 17.0 g/dL   HCT 88.5 (*) 77.1 - 10.7 %   MCV 95.3  78.0 - 100.0 fL   MCH 31.1  26.0 - 34.0 pg   MCHC 32.6  30.0 - 36.0 g/dL   RDW 89.1  76.3 - 93.9 %   Platelets 176  150 - 400 K/uL   Neutrophils Relative % 84 (*) 43 - 77 %   Neutro Abs 16.3 (*) 1.7 - 7.7 K/uL   Lymphocytes Relative 7 (*) 12 - 46 %   Lymphs Abs 1.4  0.7 - 4.0 K/uL   Monocytes Relative 8  3 - 12 %   Monocytes Absolute 1.5 (*) 0.1 - 1.0 K/uL   Eosinophils Relative 1  0 - 5 %   Eosinophils Absolute 0.2  0.0 - 0.7 K/uL   Basophils Relative 0  0 - 1 %   Basophils Absolute 0.0  0.0 - 0.1 K/uL  COMPREHENSIVE METABOLIC PANEL     Status: Abnormal   Collection Time    07/29/13  5:25 PM      Result Value Ref Range   Sodium 131 (*) 137 - 147 mEq/L   Potassium 4.9  3.7 - 5.3 mEq/L   Chloride 95 (*) 96 - 112 mEq/L   CO2 22  19 - 32 mEq/L   Glucose, Bld 121 (*) 70 - 99 mg/dL   BUN 47 (*) 6 - 23 mg/dL   Creatinine, Ser 8.93 (*) 0.50 - 1.35 mg/dL   Comment: DELTA CHECK NOTED   Calcium 7.9 (*) 8.4 - 10.5 mg/dL   Total  Protein 6.3  6.0 - 8.3 g/dL   Albumin 2.4 (*) 3.5 - 5.2 g/dL   AST 34  0 - 37 U/L   ALT 16  0 - 53 U/L   Alkaline Phosphatase 85  39 - 117 U/L   Total Bilirubin 0.2 (*) 0.3 - 1.2 mg/dL   GFR calc non Af Amer 10 (*) >90 mL/min   GFR calc Af Amer 12 (*) >90 mL/min   Comment: (NOTE)     The eGFR has been calculated using the CKD EPI equation.     This calculation has not been  validated in all clinical situations.     eGFR's persistently <90 mL/min signify possible Chronic Kidney     Disease.    Imaging Studies: Ct Abdomen Wo Contrast  2013/08/23   CLINICAL DATA:  Percutaneous nephrolithotomy  EXAM: CT ABDOMEN WITHOUT CONTRAST  TECHNIQUE: Multidetector CT imaging of the abdomen was performed following the standard protocol without IV contrast.  COMPARISON:  07/14/2013.  FINDINGS: Imaging of the abdomen was performed from the lung bases to the iliac crests.  There is a percutaneous nephrostomy tube in place with the distal tip coiled in the renal pelvis. There is been interval development of a large right subcapsular hematoma with substantial mass effect on the right kidney. The 4.4 x 2.6 cm posterior exophytic cyst along the interpolar right kidney has increased in attenuation in the interval, consistent with hemorrhage into the cyst.  The dominant stone seen in the lower pole the right kidney has decreased in the interval. Two fragments persists, the more inferior of the 2 measuring 4 x 7 mm and just cranial to this is a 2 x 4 mm fragment. There is persistence of the right hydroureter seen previously.  No interval change in the left kidney with a central apparent staghorn calculus, as on the recent CT scan.  There is some trace basilar atelectasis bilaterally. The liver and spleen are unremarkable. Stomach is decompressed. Duodenum and pancreas are normal. Layering tiny calcified stones are noted in the gallbladder. No adrenal nodule or mass.  .  No free fluid in the abdomen.  Bone windows reveal no worrisome lytic or sclerotic osseous lesions.  IMPRESSION: Large right subcapsular hematoma with substantial mass effect on the parenchyma of the right kidney.  Interval decrease in the dominant lower right renal stone with 2 residual fragments identified in the lower pole collecting system.  Right percutaneous nephrostomy catheter with the tip coiled in the renal pelvis.  Persistent  right hydroureter, stable.  Cholelithiasis.  These results will be called to the ordering clinician or representative by the Radiologist Assistant, and communication documented in the PACS Dashboard. The   Electronically Signed   By: Misty Stanley M.D.   On: 2013/08/23 08:32   Abd 1 View (kub)  23-Aug-2013   CLINICAL DATA:  Nephrostomy.  Ureteral stone.  EXAM: ABDOMEN - 1 VIEW  COMPARISON:  DG ABDOMEN 1V dated 07/27/2013; IR NEPHROSTOGRAM*R* dated 07/20/2013; CT UROGRAM dated 07/14/2013  FINDINGS: Gas pattern is nonspecific. Large amount of stool is present. Right nephrostomy tube in good anatomic position. Previously identified stone fragment is now within the right lower pelvis. This could be in the distal ureter or the bladder. No definite stone fragments noted over the right kidney however large amount of stool is present makes evaluation of this region difficult. No bony abnormality.  IMPRESSION: Large stone fragment noted over the right lower pelvis. This could be in the distal right ureter or  bladder. Right nephrostomy tube in good anatomic position .   Electronically Signed   By: Marcello Moores  Register   On: 07/28/2013 07:12   Dg Chest Port 1 View  07/29/2013   CLINICAL DATA:  Fevers  EXAM: PORTABLE CHEST - 1 VIEW  COMPARISON:  07/21/2013  FINDINGS: Left chest wall pacer device is noted with lead in the right atrial appendage and right ventricle. The heart size and mediastinal contours are within normal limits. Granuloma is noted in the right upper lobe. Both lungs are clear. The visualized skeletal structures are unremarkable.  IMPRESSION: No active disease.   Electronically Signed   By: Kerby Moors M.D.   On: 07/29/2013 17:12    Impression/Recommendations  Principal Problem:   ARF (acute renal failure) Active Problems:   Hypertension   Pacemaker   Obstructive uropathy   Aortic stenosis   Renal stone   CKD (chronic kidney disease)   Fever, unspecified   Assessment: This is 80 year old,  Caucasian male, with nephrolithiasis, who developed obstructive uropathy secondary to renal stones requiring urological procedures, who now has fever and generalized weakness. Blood work done today shows worsening creatinine from his baseline, so, he has acute renal failure as well. Recent CT scan shows large right subcapsular hematoma with mass effect on the kidney.  Plan:  #1 acute renal failure on chronic kidney disease: His baseline creatinine, appears to be around 2.4. Renal function is significantly worsened. BUN is also elevated. Sodium is low. His urine output has been poor in the last few days unless the ins and outs have not been charted accurately. All of this could be due to hypovolemia. However injury to the kidney from mass effect of the hematoma, as well as obstructive uropathy, is also contributing. Agree with IV fluids for now. Monitor urine output closely. Will send UA. Repeat renal function in the morning. If renal function does not improve with these measures Nephrology consultation may be necessary. Discontinue all nephrotoxic agents.  #2 fever with leukocytosis: In the absence of other sources tis is most likely urological. Hematoma is probably contributing. We will send urine sample from both the ileal conduit as well as the nephrostomy tube drainage. Repeat urine cultures will also be sent. Blood cultures have already been obtained and the patient has been started on Zosyn with which we agree. He is otherwise hemodynamically stable.  #3 right subcapsular hematoma with mass effect on the right kidney: Management per urology. This is typically managed conservatively. The mass effect could be contributing to renal failure. This was discussed with Dr. Jeffie Pollock. Hemoglobin did drop some, but appears to be stable. We will check daily hemoglobins for now.  #4 history of aortic stenosis, and pacemaker: These issues appear to be stable. Caution with fluids.  #5 hyponatremia: Most likely due  to hypovolemia. Recheck tomorrow after he has been given normal saline infusion.  #6 normocytic anemia: He likely has anemia of chronic disease, but probably made worse due to acute blood loss as a result of the subcapsular hematoma. Monitor hemoglobins closely.  #7 nephrolithiasis: Management per urology.   Code Status:  Full code DVT Prophylaxis: SCDs    Family Communication: Discussed with the patient, his son and his granddaughter  Disposition Plan: Patient is severely deconditioned. PT and OT are following.  [facility] will follow daily. Please contact me if I can be of assistance in the meanwhile. Thank you for this consultation.  Bonnielee Haff  [facility] Pager [phone]  If 7PM-7AM, please contact night-coverage.  [email or web address] Password [email or web address]  07/29/2013, 6:30 PM

## 2013-07-29 NOTE — Progress Notes (Signed)
OT Cancellation Note  Patient Details Name: [name] [name] MRN: [id number] DOB: Dec 08, 1921   Cancelled Treatment:    Reason Eval/Treat Not Completed: Fatigue/lethargy limiting ability to participateWill recheck on pt later in day or next day. Thanks, Sonnie Alamo D Jonne Rote 07/29/2013, 11:20 AM

## 2013-07-30 DIAGNOSIS — N390 Urinary tract infection, site not specified: Secondary | ICD-10-CM | POA: Clinically undetermined

## 2013-07-30 LAB — COMPREHENSIVE METABOLIC PANEL
ALK PHOS: 80 U/L (ref 39–117)
ALT: 12 U/L (ref 0–53)
AST: 25 U/L (ref 0–37)
Albumin: 2.2 g/dL — ABNORMAL LOW (ref 3.5–5.2)
BUN: 49 mg/dL — AB (ref 6–23)
CO2: 20 mEq/L (ref 19–32)
Calcium: 7.6 mg/dL — ABNORMAL LOW (ref 8.4–10.5)
Chloride: 99 mEq/L (ref 96–112)
Creatinine, Ser: 4.51 mg/dL — ABNORMAL HIGH (ref 0.50–1.35)
GFR, EST AFRICAN AMERICAN: 12 mL/min — AB (ref >90)
GFR, EST NON AFRICAN AMERICAN: 10 mL/min — AB (ref >90)
GLUCOSE: 99 mg/dL (ref 70–99)
POTASSIUM: 5.3 meq/L (ref 3.7–5.3)
Sodium: 132 mEq/L — ABNORMAL LOW (ref 137–147)
Total Bilirubin: 0.3 mg/dL (ref 0.3–1.2)
Total Protein: 6.1 g/dL (ref 6.0–8.3)

## 2013-07-30 LAB — CBC
HEMATOCRIT: 27.9 % — AB (ref 39.0–52.0)
HEMOGLOBIN: 9.2 g/dL — AB (ref 13.0–17.0)
MCH: 31.1 pg (ref 26.0–34.0)
MCHC: 33 g/dL (ref 30.0–36.0)
MCV: 94.3 fL (ref 78.0–100.0)
Platelets: 184 10*3/uL (ref 150–400)
RBC: 2.96 MIL/uL — ABNORMAL LOW (ref 4.22–5.81)
RDW: 13.4 % (ref 11.5–15.5)
WBC: 16.7 10*3/uL — AB (ref 4.0–10.5)

## 2013-07-30 LAB — URINE MICROSCOPIC-ADD ON

## 2013-07-30 LAB — URINALYSIS, ROUTINE W REFLEX MICROSCOPIC
BILIRUBIN URINE: NEGATIVE
Glucose, UA: NEGATIVE mg/dL
Ketones, ur: NEGATIVE mg/dL
Nitrite: NEGATIVE
PH: 6 (ref 5.0–8.0)
PROTEIN: 100 mg/dL — AB
Specific Gravity, Urine: 1.009 (ref 1.005–1.030)
Urobilinogen, UA: 0.2 mg/dL (ref 0.0–1.0)

## 2013-07-30 NOTE — Progress Notes (Signed)
3 Days Post-Op Subjective: Patient reports that he is feeling better. He is having flatus. He is having no pain.  Objective: Vital signs in last 24 hours: Temp:  [98.1 F (36.7 C)-102.5 F (39.2 C)] 98.9 F (37.2 C) (05/08 [id number]) Pulse Rate:  [74-76] 76 (05/08 0613) Resp:  [18] 18 (05/08 0613) BP: (123-130)/(56-63) 130/63 mmHg (05/08 0613) SpO2:  [94 %-98 %] 94 % (05/08 0613)  Intake/Output from previous day: 05/07 0701 - 05/08 0700 In: 2707.5 [P.O.:360; I.V.:2347.5] Out: 675 [Urine:675] Intake/Output this shift: Total I/O In: 1040 [P.O.:240; I.V.:800] Out: -   Physical Exam:  Constitutional: Vital signs reviewed. WD WN in NAD   Eyes: PERRL, No scleral icterus.   Pulmonary/Chest: Normal effort Abdominal: Mildly tympanitic, no tenderness, rebound or guarding. Extremities: Edema noted  Lab Results:  Recent Labs  07/28/13 0635 07/29/13 1725 07/30/13 0455  HGB 10.7* 9.2* 9.2*  HCT 32.9* 28.2* 27.9*   BMET  Recent Labs  07/29/13 1725 07/30/13 0455  NA 131* 132*  K 4.9 5.3  CL 95* 99  CO2 22 20  GLUCOSE 121* 99  BUN 47* 49*  CREATININE 4.67* 4.51*  CALCIUM 7.9* 7.6*   No results found for this basename: LABPT, INR,  in the last 72 hours No results found for this basename: LABURIN,  in the last 72 hours Results for orders placed during the hospital encounter of 07/27/13  CULTURE, BLOOD (ROUTINE X 2)     Status: None   Collection Time    07/29/13  5:10 PM      Result Value Ref Range Status   Specimen Description BLOOD RIGHT HAND   Final   Special Requests BOTTLES DRAWN AEROBIC ONLY 10CC   Final   Culture  Setup Time     Final   Value: 07/29/2013 20:47     Performed at [facility]   Culture     Final   Value:        BLOOD CULTURE RECEIVED NO GROWTH TO DATE CULTURE WILL BE HELD FOR 5 DAYS BEFORE ISSUING A FINAL NEGATIVE REPORT     Performed at [facility]   Report Status PENDING   Incomplete  CULTURE, BLOOD (ROUTINE X 2)     Status: None    Collection Time    07/29/13  5:25 PM      Result Value Ref Range Status   Specimen Description BLOOD RIGHT ARM   Final   Special Requests BOTTLES DRAWN AEROBIC AND ANAEROBIC 10CC   Final   Culture  Setup Time     Final   Value: 07/29/2013 20:47     Performed at [facility]   Culture     Final   Value:        BLOOD CULTURE RECEIVED NO GROWTH TO DATE CULTURE WILL BE HELD FOR 5 DAYS BEFORE ISSUING A FINAL NEGATIVE REPORT     Performed at [facility]   Report Status PENDING   Incomplete    Studies/Results: Dg Chest Port 1 View  07/29/2013   CLINICAL DATA:  Fevers  EXAM: PORTABLE CHEST - 1 VIEW  COMPARISON:  07/21/2013  FINDINGS: Left chest wall pacer device is noted with lead in the right atrial appendage and right ventricle. The heart size and mediastinal contours are within normal limits. Granuloma is noted in the right upper lobe. Both lungs are clear. The visualized skeletal structures are unremarkable.  IMPRESSION: No active disease.   Electronically Signed   By: Lovena Le  Clovis Riley M.D.   On: 07/29/2013 17:12    Assessment/Plan:   Status post percutaneous nephrolithotomy. With postop infection and renal insufficiency. Cultures reveal no growth so far. Creatinine is improving somewhat.    Appreciate hospitalist input.    Continue to follow renal function. Medical tuneup her hospital service.   LOS: 3 days   Franchot Gallo 07/30/2013, 8:46 AM

## 2013-07-30 NOTE — Progress Notes (Signed)
[facility] PROGRESS NOTE  BOLESLAW MARKOWITZ [id number] DOB: 01-08-1922 DOA: 07/27/2013  PCP: Jerlyn Ly, MD  Brief HPI: [name] [name] is a 80 y.o. male with a past medical history of pacemaker placement for symptomatic bradycardia, history of mild to moderate aortic stenosis by echocardiogram in 2013, hypertension, who has had urological issues for a long time. He's had multiple urological procedures in the last 2-3 weeks including percutaneous nephrostomy tube placement for obstructive hydronephrosis secondary to stone. Urine cultures from April 22 showed Proteus mirabilis and E coli. Proteus was not sensitive to cefazolin. We were consulted as patient developed fever and for fluid management. He was found to new ARF on CKD as well.   Past medical history:  Past Medical History  Diagnosis Date  . Hypertension   . Aortic valve stenosis   . Gout   . Hx of bladder cancer   . Deep venous thrombosis     hx of  LEFT ARM AFTER PACEMAKER INSERTION ABOUT 6 YRS AGO  . Osteoporosis   . Renal failure   . Heart murmur   . Depression   . Chronic kidney disease   . Kidney stone   . Prostate cancer     AND BLADDER CANCER - S/P URETEROILEAL CONDUIT  . Bradycardia     HX OF SYMPTOMATIC BRADYCARDIA - STATUS POST PERMANENT PACEMAKER  . S/P ileal conduit     HX BLADDER AND PROSTATE CANCER  . Macular degeneration     PT STATES HIS VISION STILL OKAY FOR DRIVING  . 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 GREGG TAYLOR -ELECTROPHYSIOLOGIST  . Hydronephrosis with obstructing calculus     RIGHT SIDE - HOSPITALIZED 07-14-13 OT 07-17-13   Primary Service: Urology  Procedures: Multiple Urological procedures.  Antibiotics: Zosyn 5/7-->  Subjective: Patient feels well. Trying to eat breakfast. No nausea. No shortness of breath. Denies pain. Some soreness at PCN tube site.  Objective: Vital Signs  Filed Vitals:   07/29/13 0545 07/29/13 1428 07/29/13 2059 07/30/13 0613   BP: 118/54 126/56 123/60 130/63  Pulse: 75 74 76 76  Temp: 98.2 F (36.8 C) 102.5 F (39.2 C) 98.1 F (36.7 C) 98.9 F (37.2 C)  TempSrc: Oral Oral Oral Oral  Resp: 18 18 18 18   Height:      Weight:      SpO2: 99% 94% 98% 94%    Intake/Output Summary (Last 24 hours) at 07/30/13 0750 Last data filed at 07/30/13 [id number]  Gross per 24 hour  Intake 2707.5 ml  Output    675 ml  Net 2032.5 ml   Filed Weights   07/27/13 1440  Weight: 76.204 kg (168 lb)   General appearance: alert, cooperative, appears stated age and no distress Head: Normocephalic, without obvious abnormality, atraumatic Throat: lips, mucosa, and tongue normal; teeth and gums normal Neck: no adenopathy, no carotid bruit, no JVD, supple, symmetrical, trachea midline and thyroid not enlarged, symmetric, no tenderness/mass/nodules Resp: clear to auscultation bilaterally Cardio: regular rate and rhythm, S1, S2 normal, systolic murmur: early systolic 3/6, crescendo throughout the precordium, no click, no rub and no edema GI: soft, non-tender; bowel sounds normal; no masses,  no organomegaly with ileal conduit noted filled with urine Extremities: extremities normal, atraumatic, no cyanosis or edema Pulses: 2+ and symmetric Skin: Skin color, texture, turgor normal. No rashes or lesions Lymph nodes: Cervical, supraclavicular, and axillary nodes normal. Neurologic: Alert and oriented x 3. Hard of hearing. No focal deficits.  Lab  Results:  Basic Metabolic Panel:  Recent Labs Lab 07/28/13 0635 07/29/13 1725 07/30/13 0455  NA 135* 131* 132*  K 5.3 4.9 5.3  CL 99 95* 99  CO2 25 22 20   GLUCOSE 112* 121* 99  BUN 33* 47* 49*  CREATININE 2.54* 4.67* 4.51*  CALCIUM 8.2* 7.9* 7.6*   Liver Function Tests:  Recent Labs Lab 07/29/13 1725 07/30/13 0455  AST 34 25  ALT 16 12  ALKPHOS 85 80  BILITOT 0.2* 0.3  PROT 6.3 6.1  ALBUMIN 2.4* 2.2*   CBC:  Recent Labs Lab 07/28/13 0635 07/29/13 1725 07/30/13 0455    WBC  --  19.3* 16.7*  NEUTROABS  --  16.3*  --   HGB 10.7* 9.2* 9.2*  HCT 32.9* 28.2* 27.9*  MCV  --  95.3 94.3  PLT  --  176 184    Studies/Results: Ct Abdomen Wo Contrast  07/28/2013   CLINICAL DATA:  Percutaneous nephrolithotomy  EXAM: CT ABDOMEN WITHOUT CONTRAST  TECHNIQUE: Multidetector CT imaging of the abdomen was performed following the standard protocol without IV contrast.  COMPARISON:  07/14/2013.  FINDINGS: Imaging of the abdomen was performed from the lung bases to the iliac crests.  There is a percutaneous nephrostomy tube in place with the distal tip coiled in the renal pelvis. There is been interval development of a large right subcapsular hematoma with substantial mass effect on the right kidney. The 4.4 x 2.6 cm posterior exophytic cyst along the interpolar right kidney has increased in attenuation in the interval, consistent with hemorrhage into the cyst.  The dominant stone seen in the lower pole the right kidney has decreased in the interval. Two fragments persists, the more inferior of the 2 measuring 4 x 7 mm and just cranial to this is a 2 x 4 mm fragment. There is persistence of the right hydroureter seen previously.  No interval change in the left kidney with a central apparent staghorn calculus, as on the recent CT scan.  There is some trace basilar atelectasis bilaterally. The liver and spleen are unremarkable. Stomach is decompressed. Duodenum and pancreas are normal. Layering tiny calcified stones are noted in the gallbladder. No adrenal nodule or mass.  .  No free fluid in the abdomen.  Bone windows reveal no worrisome lytic or sclerotic osseous lesions.  IMPRESSION: Large right subcapsular hematoma with substantial mass effect on the parenchyma of the right kidney.  Interval decrease in the dominant lower right renal stone with 2 residual fragments identified in the lower pole collecting system.  Right percutaneous nephrostomy catheter with the tip coiled in the renal  pelvis.  Persistent right hydroureter, stable.  Cholelithiasis.  These results will be called to the ordering clinician or representative by the Radiologist Assistant, and communication documented in the PACS Dashboard. The   Electronically Signed   By: Misty Stanley M.D.   On: 07/28/2013 08:32   Dg Chest Port 1 View  07/29/2013   CLINICAL DATA:  Fevers  EXAM: PORTABLE CHEST - 1 VIEW  COMPARISON:  07/21/2013  FINDINGS: Left chest wall pacer device is noted with lead in the right atrial appendage and right ventricle. The heart size and mediastinal contours are within normal limits. Granuloma is noted in the right upper lobe. Both lungs are clear. The visualized skeletal structures are unremarkable.  IMPRESSION: No active disease.   Electronically Signed   By: Kerby Moors M.D.   On: 07/29/2013 17:12    Medications:  Scheduled: . acidophilus  1 capsule Oral Daily  . amLODipine  2.5 mg Oral q morning - 10a  . docusate sodium  100 mg Oral BID  . mupirocin ointment  1 application Nasal BID  . piperacillin-tazobactam (ZOSYN)  IV  2.25 g Intravenous 4 times per day  . sodium chloride  250 mL Intravenous Once   Continuous: . sodium chloride 100 mL/hr at 07/30/13 0251   [id number], diphenhydrAMINE, diphenhydrAMINE, docusate sodium, HYDROcodone-acetaminophen, HYDROcodone-acetaminophen, HYDROmorphone (DILAUDID) injection, ondansetron  Assessment/Plan:  Principal Problem:   ARF (acute renal failure) Active Problems:   Hypertension   Pacemaker   Obstructive uropathy   Aortic stenosis   Renal stone   CKD (chronic kidney disease)   Fever, unspecified    Acute renal failure on chronic kidney disease His baseline creatinine appears to be around 2.4. Renal function was significantly worse on 5/7. Started on IVF and has improved slightly today. Potassium is higher limits of normal. Monitor for now and repeat in AM. Continue IVF. UA reviewed. The etiology is thought to be due to hypovolemia.  However injury to the kidney from mass effect of the hematoma, as well as obstructive uropathy, is also contributing.   Fever with leukocytosis In the absence of other sources this is most likely urological. UA is noted to be abnormal suggestive of UTI. Await cultures. Continue Zosyn. No fever since last evening. WBC is better. Hematoma is probably also contributing to fever. Blood cultures are pending as well.   Right subcapsular Renal hematoma with mass effect on the right kidney Management per urology. This is typically managed conservatively. The mass effect could be contributing to renal failure. This was discussed with Dr. Jeffie Pollock. Hemoglobin did drop some, but appears to be stable. We will check daily hemoglobins for now.   History of aortic stenosis, and pacemaker These issues appear to be stable. Caution with fluids. His respiratory and cardiac status are stable.  Hyponatremia Slightly improved. Most likely due to hypovolemia. Recheck daily.   Normocytic anemia He likely has underlying anemia of chronic disease, but probably made worse due to acute blood loss as a result of the subcapsular hematoma. Monitor hemoglobins closely.   Nephrolithiasis Management per urology.   Code Status: Full code  DVT Prophylaxis: SCDs  Family Communication: Discussed with the patient and his daughter Disposition Plan: Patient is deconditioned. PT and OT are following.     LOS: 3 days   [facility] Pager [phone] 07/30/2013, 7:50 AM  If 8PM-8AM, please contact night-coverage at [email or web address], password [facility]

## 2013-07-30 NOTE — Progress Notes (Signed)
Resumed care of patient.  Pt asleep and easily aroused.  Pt has no complaints at this time.  No change in patient assessment.  Will continue to monitor.

## 2013-07-30 NOTE — Progress Notes (Signed)
I have reviewed this note and agree with all findings. Kati Colinda Barth, PT, DPT Pager: [phone]   

## 2013-07-30 NOTE — Progress Notes (Signed)
Occupational Therapy Treatment Patient Details Name: [name] [name] MRN: [id number] DOB: 1921-08-15 Today's Date: 07/30/2013    History of present illness 80 yo male admitted with renal [name] [name]. s/p percutaneous nephrolithiotomy 5/5.    OT comments  Pt making good progress with ADL today. Per daughter, plan is for pt to d/c to her house with assist. Pt motivated and will benefit from continued OT services.    Follow Up Recommendations  Home health OT;Supervision/Assistance - 24 hour    Equipment Recommendations  3 in 1 bedside comode    Recommendations for Other Services      Precautions / Restrictions Precautions Precautions: Fall Precaution Comments: multiple drains Restrictions Weight Bearing Restrictions: No       Mobility Bed Mobility Overal bed mobility: Needs Assistance Bed Mobility: Supine to Sit     Supine to sit: Min guard;HOB elevated     General bed mobility comments: pt reprots feeling stronger and is able to perform bed mobility without assist with increased time  Transfers Overall transfer level: Needs assistance Equipment used: Rolling walker (2 wheeled) Transfers: Sit to/from Stand Sit to Stand: Min guard         General transfer comment: min guard for safety and verbal cues for hand placement. increased time.    Balance                              ADL       Grooming: Wash/dry hands;Oral care;Min guard;Standing                   Toilet Transfer: Minimal assistance;Comfort height toilet;Grab bars;Ambulation;RW             General ADL Comments: Pt did well with transferring into bathroom to perform grooming at the sink and toilet transfer. Pt had BM on commode and nursing made aware. Pt with some difficulty safely descending to toilet using only one side grab bar as well as rising  to walker from  low toilet. Discussed 3in1 option with pt and daughter and recommend one for safety. They are agreeable. Daughter  reports the plan is for pt to d/c to her house with assist.       Vision                     Perception     Praxis      Cognition   Behavior During Therapy: [name] for tasks assessed/performed Overall Cognitive Status: Within Functional Limits for tasks assessed                       Extremity/Trunk Assessment               Exercises    Shoulder Instructions       General Comments      Pertinent Vitals/ Pain       No complaint of  Home Living                                          Prior Functioning/Environment              Frequency Min 2X/week     Progress Toward Goals  OT Goals(current goals can now be found in the care plan section)  Progress towards OT goals: Progressing toward goals  Plan Discharge plan needs to be updated    Co-evaluation                 End of Session Equipment Utilized During Treatment: Rolling walker   Activity Tolerance Patient tolerated treatment well   Patient Left in chair;with call bell/phone within reach;with family/visitor present   Nurse Communication          Time: [id number] OT Time Calculation (min): 24 min  Charges: OT General Charges $OT Visit: 1 Procedure OT Treatments $Self Care/Home Management : 8-22 mins $Therapeutic Activity: 8-22 mins  [place] [id number] 07/30/2013, 12:50 PM

## 2013-07-30 NOTE — Progress Notes (Signed)
Physical Therapy Treatment Patient Details Name: [name] [name] MRN: [id number] DOB: [birth year] Today's Date: 07/30/2013    History of Present Illness 80 yo male admitted with renal stone. s/p percutaneous nephrolithiotomy 5/5.     PT Comments    Pt is progressing well with mobility, reports walking in hall with nursing staff earlier in day.  Pt still requires min guard as he is unsteady at times but should continue improving.  Performed LE strengthening exercises.      Follow Up Recommendations  Home health PT;Supervision/Assistance - 24 hour     Equipment Recommendations  None recommended by PT    Recommendations for Other Services       Precautions / Restrictions Precautions Precautions: Fall Precaution Comments: R sided drain Restrictions Weight Bearing Restrictions: No    Mobility  Bed Mobility Overal bed mobility: Needs Assistance Bed Mobility: Supine to Sit     Supine to sit: Min guard;HOB elevated     General bed mobility comments: pt reports feeling stronger and is able to perform bed mobility without assist with increased time  Transfers Overall transfer level: Needs assistance Equipment used: Rolling walker (2 wheeled) Transfers: Sit to/from Stand Sit to Stand: Min guard         General transfer comment: pt has good motor control of ascent and descent, close guarding an decreased time with transfer  Ambulation/Gait Ambulation/Gait assistance: Min guard Ambulation Distance (Feet): 120 Feet Assistive device: Rolling walker (2 wheeled) Gait Pattern/deviations: Step-through pattern;Decreased stride length;Trunk flexed Gait velocity: decreased   General Gait Details: pt tripped slightly on R LE but able to self-recover, verbal cues for safe steering of RW around obstacles; ambulation improved since prior treatment   Stairs            Wheelchair Mobility    Modified Rankin (Stroke Patients Only)       Balance Overall balance  assessment: Needs assistance Sitting-balance support: No upper extremity supported;Feet supported Sitting balance-Leahy Scale: Good     Standing balance support: Bilateral upper extremity supported;During functional activity Standing balance-Leahy Scale: Fair                      Cognition Arousal/Alertness: Awake/alert Behavior During Therapy: WFL for tasks assessed/performed Overall Cognitive Status: Within Functional Limits for tasks assessed                      Exercises General Exercises - Lower Extremity Ankle Circles/Pumps: AROM;Both;20 reps;Supine Quad Sets: AROM;Both;Supine;15 reps Gluteal Sets: AROM;Both;15 reps;Supine Long Arc Quad: AROM;Both;15 reps;Seated Heel Slides: AROM;Both;15 reps;Supine Hip ABduction/ADduction: AROM;Both;15 reps;Supine Straight Leg Raises: AROM;Both;15 reps;Supine;AAROM;Other (comment) (needed assist for R LE SLR, reported muscle was tight and feeling weak) Hip Flexion/Marching: AROM;Both;15 reps;Seated Other Exercises Other Exercises: Towel Squeeze; AROM; Both; 15 reps; Supine    General Comments        Pertinent Vitals/Pain Pt complains of muscle tightness on lateral L thigh.  Activity to tolerance.    Home Living                      Prior Function            PT Goals (current goals can now be found in the care plan section) Progress towards PT goals: Progressing toward goals    Frequency  Min 3X/week    PT Plan Discharge plan needs to be updated    Co-evaluation  End of Session Equipment Utilized During Treatment: Gait belt Activity Tolerance: Patient tolerated treatment well Patient left: in chair;with call bell/phone within reach;with family/visitor present     Time: 1117-1140 PT Time Calculation (min): 23 min  Charges:  $Gait Training: 8-22 mins $Therapeutic Exercise: 8-22 mins                    G Codes:      Jacqulyn Cane 08/13/13, 1:31 PM Jacqulyn Cane SPT  08/13/13

## 2013-07-31 DIAGNOSIS — E875 Hyperkalemia: Secondary | ICD-10-CM

## 2013-07-31 LAB — CBC
HCT: 27 % — ABNORMAL LOW (ref 39.0–52.0)
HEMOGLOBIN: 9 g/dL — AB (ref 13.0–17.0)
MCH: 31.4 pg (ref 26.0–34.0)
MCHC: 33.3 g/dL (ref 30.0–36.0)
MCV: 94.1 fL (ref 78.0–100.0)
Platelets: 202 10*3/uL (ref 150–400)
RBC: 2.87 MIL/uL — ABNORMAL LOW (ref 4.22–5.81)
RDW: 13.5 % (ref 11.5–15.5)
WBC: 13.1 10*3/uL — ABNORMAL HIGH (ref 4.0–10.5)

## 2013-07-31 LAB — BASIC METABOLIC PANEL
BUN: 48 mg/dL — ABNORMAL HIGH (ref 6–23)
CALCIUM: 7.7 mg/dL — AB (ref 8.4–10.5)
CO2: 21 mEq/L (ref 19–32)
Chloride: 108 mEq/L (ref 96–112)
Creatinine, Ser: 3.99 mg/dL — ABNORMAL HIGH (ref 0.50–1.35)
GFR calc Af Amer: 14 mL/min — ABNORMAL LOW (ref >90)
GFR calc non Af Amer: 12 mL/min — ABNORMAL LOW (ref >90)
Glucose, Bld: 95 mg/dL (ref 70–99)
Potassium: 5.4 mEq/L — ABNORMAL HIGH (ref 3.7–5.3)
SODIUM: 139 meq/L (ref 137–147)

## 2013-07-31 MED ORDER — SODIUM POLYSTYRENE SULFONATE 15 GM/60ML PO SUSP
30.0000 g | Freq: Once | ORAL | Status: AC
  Administered 2013-07-31: 30 g via ORAL
  Filled 2013-07-31: qty 120

## 2013-07-31 NOTE — Progress Notes (Signed)
4 Days Post-Op  Subjective:  1 - Large Rt Renal Stone - s/p Rt percutaneous nephrostolithotom 07/28/13 by Dr. Jeffie Pollock. Rt neph tube in situ.   2 - Acute on Chronic Renal Failure - Baseline Cr 2-3 range, acute rise to 4.5 post-op. Now trending better.  3 - Complex Urinary Tract Infection  - fever and leukocytosis post-op. UCX 5/7 pending. Received Cipro peri-op, no on zosyn.  4 - Bladder Cancer - s/p cystectomy with ileal conduit previously. Most recent imaging w/o recurrence.   Today [name] [name] is feeling improved. No specific complaints. No fevers in 24 hours. No neph tube problems.   Objective: Vital signs in last 24 hours: Temp:  [97.1 F (36.2 C)-99.3 F (37.4 C)] 97.1 F (36.2 C) (05/09 0535) Pulse Rate:  [75-76] 76 (05/09 0535) Resp:  [18-19] 18 (05/09 0535) BP: (123-132)/(53-61) 123/59 mmHg (05/09 0535) SpO2:  [96 %-98 %] 97 % (05/09 0535) Last BM Date: 07/30/13  Intake/Output from previous day: 05/08 0701 - 05/09 0700 In: 4470 [P.O.:1080; I.V.:3090; IV Piggyback:300] Out: 2001 [Urine:2000; Stool:1] Intake/Output this shift: Total I/O In: 1390 [I.V.:1290; IV Piggyback:100] Out: 1075 [Urine:1075]  General appearance: alert, cooperative, appears stated age and family at bedside, pt very vigorous for age Head: Normocephalic, without obvious abnormality, atraumatic Throat: lips, mucosa, and tongue normal; teeth and gums normal Neck: supple, symmetrical, trachea midline Back: symmetric, no curvature. ROM normal. No CVA tenderness. Resp: no labored breathing Chest wall: no tenderness Cardio: regular rate and rhythm, S1, S2 normal, no murmur, click, rub or gallop GI: soft, non-tender; bowel sounds normal; no masses,  no organomegaly Male genitalia: normal Extremities: extremities normal, atraumatic, no cyanosis or edema Pulses: 2+ and symmetric Skin: Skin color, texture, turgor normal. No rashes or lesions Lymph nodes: Cervical, supraclavicular, and axillary nodes  normal. Neurologic: Grossly normal Incision/Wound: RLQ conduit with clear urine. Rt flank neph tube c/d/i with clear urine.  Lab Results:   Recent Labs  07/30/13 0455 07/31/13 0528  WBC 16.7* 13.1*  HGB 9.2* 9.0*  HCT 27.9* 27.0*  PLT 184 202   BMET  Recent Labs  07/30/13 0455 07/31/13 0528  NA 132* 139  K 5.3 5.4*  CL 99 108  CO2 20 21  GLUCOSE 99 95  BUN 49* 48*  CREATININE 4.51* 3.99*  CALCIUM 7.6* 7.7*   PT/INR No results found for this basename: LABPROT, INR,  in the last 72 hours ABG No results found for this basename: PHART, PCO2, PO2, HCO3,  in the last 72 hours  Studies/Results: Dg Chest Port 1 View  07/29/2013   CLINICAL DATA:  Fevers  EXAM: PORTABLE CHEST - 1 VIEW  COMPARISON:  07/21/2013  FINDINGS: Left chest wall pacer device is noted with lead in the right atrial appendage and right ventricle. The heart size and mediastinal contours are within normal limits. Granuloma is noted in the right upper lobe. Both lungs are clear. The visualized skeletal structures are unremarkable.  IMPRESSION: No active disease.   Electronically Signed   By: Kerby Moors M.D.   On: 07/29/2013 17:12    Anti-infectives: Anti-infectives   Start     Dose/Rate Route Frequency Ordered Stop   07/30/13 0000  piperacillin-tazobactam (ZOSYN) IVPB 2.25 g     2.25 g 100 mL/hr over 30 Minutes Intravenous 4 times per day 07/29/13 1649     07/29/13 1700  piperacillin-tazobactam (ZOSYN) IVPB 2.25 g     2.25 g 100 mL/hr over 30 Minutes Intravenous NOW 07/29/13 1648 07/29/13 1849  07/28/13 0000  cephALEXin (KEFLEX) 250 MG capsule     250 mg Oral Daily at bedtime 07/28/13 1159     07/27/13 1600  cephALEXin (KEFLEX) capsule 250 mg  Status:  Discontinued     250 mg Oral 3 times daily 07/27/13 1447 07/29/13 1642   07/27/13 0835  ceFAZolin (ANCEF) IVPB 2 g/50 mL premix     2 g 100 mL/hr over 30 Minutes Intravenous 30 min pre-op 07/27/13 0835 07/27/13 1139      Assessment/Plan:  1 -  Large Rt Renal Stone - s/p first stage procedure. Any additional treatment only after back to baseline.   2 - Acute on Chronic Renal Failure - Likely multifactorial with pre-renal on intrinsic renal. Improving some now.   3 - Complex Urinary Tract Infection  - Appreciate hospitalist input, agree with Zosyn empiric while final CX pending.  4 - Bladder Cancer - excellent control by most recent imaging.  5 - Remain in house.  [name] [name] 07/31/2013

## 2013-07-31 NOTE — Progress Notes (Signed)
[facility] PROGRESS NOTE  DAT [name] [name] DOB: 07-19-1921 DOA: 07/27/2013  PCP: Jerlyn Ly, MD  Brief HPI: [name] [name] is a 80 y.o. male with a past medical history of pacemaker placement for symptomatic bradycardia, history of mild to moderate aortic stenosis by echocardiogram in 2013, hypertension, who has had urological issues for a long time. He's had multiple urological procedures in the last 2-3 weeks including percutaneous nephrostomy tube placement for obstructive hydronephrosis secondary to stone. Urine cultures from April 22 showed Proteus mirabilis and E coli. Proteus was not sensitive to cefazolin. We were consulted as patient developed fever and for fluid management. He was found to new ARF on CKD as well.   Past medical history:  Past Medical History  Diagnosis Date  . Hypertension   . Aortic valve stenosis   . Gout   . Hx of bladder cancer   . Deep venous thrombosis     hx of  LEFT ARM AFTER PACEMAKER INSERTION ABOUT 6 YRS AGO  . Osteoporosis   . Renal failure   . Heart murmur   . Depression   . Chronic kidney disease   . Kidney stone   . Prostate cancer     AND BLADDER CANCER - S/P URETEROILEAL CONDUIT  . Bradycardia     HX OF SYMPTOMATIC BRADYCARDIA - STATUS POST PERMANENT PACEMAKER  . S/P ileal conduit     HX BLADDER AND PROSTATE CANCER  . Macular degeneration     PT STATES HIS VISION STILL OKAY FOR DRIVING  . 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 GREGG TAYLOR -ELECTROPHYSIOLOGIST  . Hydronephrosis with obstructing calculus     RIGHT SIDE - HOSPITALIZED 07-14-13 OT 07-17-13   Primary Service: Urology  Procedures: Multiple Urological procedures.  Antibiotics: Zosyn 5/7-->  Subjective: Patient continues to feel well. Appetite remains poor but better than what it was. BM today. No shortness of breath. Denies pain.   Objective: Vital Signs  Filed Vitals:   07/30/13 0613 07/30/13 1327 07/30/13 2102  07/31/13 0535  BP: 130/63 124/53 132/61 123/59  Pulse: 76 76 75 76  Temp: 98.9 F (37.2 C) 98.7 F (37.1 C) 99.3 F (37.4 C) 97.1 F (36.2 C)  TempSrc: Oral Oral Oral Oral  Resp: 18 19 18 18   Height:      Weight:      SpO2: 94% 98% 96% 97%    Intake/Output Summary (Last 24 hours) at 07/31/13 0748 Last data filed at 07/31/13 [id number]  Gross per 24 hour  Intake   4470 ml  Output   2001 ml  Net   2469 ml   Filed Weights   07/27/13 1440  Weight: 76.204 kg (168 lb)   General appearance: alert, cooperative, appears stated age and no distress Resp: clear to auscultation bilaterally Cardio: regular rate and rhythm, S1, S2 normal, systolic murmur: early systolic 3/6, crescendo throughout the precordium, no click, no rub and no edema GI: soft, non-tender; bowel sounds normal; no masses,  no organomegaly with ileal conduit noted filled with urine Extremities: extremities normal, atraumatic, no cyanosis or edema Neurologic: Alert and oriented x 3. Hard of hearing. No focal deficits.  Lab Results:  Basic Metabolic Panel:  Recent Labs Lab 07/28/13 0635 07/29/13 1725 07/30/13 0455 07/31/13 0528  NA 135* 131* 132* 139  K 5.3 4.9 5.3 5.4*  CL 99 95* 99 108  CO2 25 22 20 21   GLUCOSE 112* 121* 99 95  BUN 33* 47* 49*  48*  CREATININE 2.54* 4.67* 4.51* 3.99*  CALCIUM 8.2* 7.9* 7.6* 7.7*   Liver Function Tests:  Recent Labs Lab 07/29/13 1725 07/30/13 0455  AST 34 25  ALT 16 12  ALKPHOS 85 80  BILITOT 0.2* 0.3  PROT 6.3 6.1  ALBUMIN 2.4* 2.2*   CBC:  Recent Labs Lab 07/28/13 0635 07/29/13 1725 07/30/13 0455 07/31/13 0528  WBC  --  19.3* 16.7* 13.1*  NEUTROABS  --  16.3*  --   --   HGB 10.7* 9.2* 9.2* 9.0*  HCT 32.9* 28.2* 27.9* 27.0*  MCV  --  95.3 94.3 94.1  PLT  --  176 184 202    Studies/Results: Dg Chest Port 1 View  07/29/2013   CLINICAL DATA:  Fevers  EXAM: PORTABLE CHEST - 1 VIEW  COMPARISON:  07/21/2013  FINDINGS: Left chest wall pacer device is noted  with lead in the right atrial appendage and right ventricle. The heart size and mediastinal contours are within normal limits. Granuloma is noted in the right upper lobe. Both lungs are clear. The visualized skeletal structures are unremarkable.  IMPRESSION: No active disease.   Electronically Signed   By: Kerby Moors M.D.   On: 07/29/2013 17:12    Medications:  Scheduled: . acidophilus  1 capsule Oral Daily  . amLODipine  2.5 mg Oral q morning - 10a  . docusate sodium  100 mg Oral BID  . mupirocin ointment  1 application Nasal BID  . piperacillin-tazobactam (ZOSYN)  IV  2.25 g Intravenous 4 times per day  . sodium chloride  250 mL Intravenous Once  . sodium polystyrene  30 g Oral Once   Continuous: . sodium chloride 100 mL/hr at 07/30/13 2337   [id number], diphenhydrAMINE, diphenhydrAMINE, docusate sodium, HYDROcodone-acetaminophen, HYDROcodone-acetaminophen, HYDROmorphone (DILAUDID) injection, ondansetron  Assessment/Plan:  Principal Problem:   ARF (acute renal failure) Active Problems:   Hypertension   Pacemaker   Obstructive uropathy   Aortic stenosis   Renal stone   CKD (chronic kidney disease)   Fever, unspecified   UTI (urinary tract infection)    Acute renal failure on chronic kidney disease His baseline creatinine appears to be around 2.4. Renal function was significantly worse on 5/7. Started on IVF and creatinine is improving. Potassium is high today. Will give Kayexalate. Continue IVF but at lower rate. UA reviewed. The etiology is thought to be due to hypovolemia. However injury to the kidney from mass effect of the hematoma, as well as obstructive uropathy, is also contributing.   Fever with leukocytosis In the absence of other sources this is most likely urological. UA is noted to be abnormal suggestive of UTI. Await cultures. Continue Zosyn. No fever for last 48 hrs. WBC is better. Hematoma is probably also contributing to fever. Blood cultures are  pending as well.   Right subcapsular Renal hematoma with mass effect on the right kidney Management per urology. This is typically managed conservatively. The mass effect could be contributing to renal failure. This was discussed with Dr. Jeffie Pollock. Hemoglobin did drop some, but appears to be stable. We will check daily hemoglobins for now.   History of aortic stenosis, and pacemaker These issues appear to be stable. Caution with fluids. His respiratory and cardiac status are stable.  Hyponatremia Now normal. Was most likely due to hypovolemia.   Normocytic anemia He likely has underlying anemia of chronic disease, but probably made worse due to acute blood loss as a result of the renal subcapsular hematoma. Monitor hemoglobins closely.  Nephrolithiasis Management per urology.   Code Status: Full code  DVT Prophylaxis: SCDs  Family Communication: Discussed with the patient and his daughter Disposition Plan: Patient is deconditioned. PT and OT are following.  Patient is improving satisfactorily. [facility] will continue to follow daily. Thanks for the consult.    LOS: 4 days   [facility] Pager [phone] 07/31/2013, 7:48 AM  If 8PM-8AM, please contact night-coverage at [email or web address], password [facility]

## 2013-08-01 DIAGNOSIS — N189 Chronic kidney disease, unspecified: Secondary | ICD-10-CM

## 2013-08-01 DIAGNOSIS — N390 Urinary tract infection, site not specified: Secondary | ICD-10-CM

## 2013-08-01 LAB — CBC
HEMATOCRIT: 27.2 % — AB (ref 39.0–52.0)
HEMOGLOBIN: 9 g/dL — AB (ref 13.0–17.0)
MCH: 31 pg (ref 26.0–34.0)
MCHC: 33.1 g/dL (ref 30.0–36.0)
MCV: 93.8 fL (ref 78.0–100.0)
Platelets: 211 10*3/uL (ref 150–400)
RBC: 2.9 MIL/uL — ABNORMAL LOW (ref 4.22–5.81)
RDW: 13.7 % (ref 11.5–15.5)
WBC: 10.5 10*3/uL (ref 4.0–10.5)

## 2013-08-01 LAB — BASIC METABOLIC PANEL
BUN: 38 mg/dL — ABNORMAL HIGH (ref 6–23)
CHLORIDE: 109 meq/L (ref 96–112)
CO2: 19 mEq/L (ref 19–32)
Calcium: 7.3 mg/dL — ABNORMAL LOW (ref 8.4–10.5)
Creatinine, Ser: 3.1 mg/dL — ABNORMAL HIGH (ref 0.50–1.35)
GFR calc Af Amer: 19 mL/min — ABNORMAL LOW (ref >90)
GFR, EST NON AFRICAN AMERICAN: 16 mL/min — AB (ref >90)
GLUCOSE: 100 mg/dL — AB (ref 70–99)
Potassium: 3.9 mEq/L (ref 3.7–5.3)
Sodium: 143 mEq/L (ref 137–147)

## 2013-08-01 LAB — URINE CULTURE

## 2013-08-01 MED ORDER — CIPROFLOXACIN HCL 500 MG PO TABS
500.0000 mg | ORAL_TABLET | Freq: Every day | ORAL | Status: DC
  Administered 2013-08-01 – 2013-08-04 (×4): 500 mg via ORAL
  Filled 2013-08-01 (×5): qty 1

## 2013-08-01 NOTE — Progress Notes (Signed)
5 Days Post-Op  Subjective:  1 - Large Rt Renal Stone - s/p Rt percutaneous nephrostolithotom 07/28/13 by Dr. Jeffie Pollock. Rt neph tube in situ.   2 - Acute on Chronic Renal Failure - Baseline Cr 2-3 range, acute rise to 4.5 post-op. Now trending better.  3 - Complex Urinary Tract Infection  - fever and leukocytosis post-op. UCX 5/7 pending. Received Cipro peri-op, no on zosyn.  4 - Bladder Cancer - s/p cystectomy with ileal conduit previously. Most recent imaging w/o recurrence.   Today [name] [name] is stable. Remain afebrile and re-growth UCX still pending. Renal function continues to improve. No neph tube problems.  Objective: Vital signs in last 24 hours: Temp:  [97.6 F (36.4 C)-97.9 F (36.6 C)] 97.9 F (36.6 C) (05/10 0421) Pulse Rate:  [74-79] 79 (05/10 0421) Resp:  [18-20] 20 (05/10 0421) BP: (136-166)/(60-75) 136/60 mmHg (05/10 0421) SpO2:  [98 %-100 %] 98 % (05/10 0421) Last BM Date: 07/31/13  Intake/Output from previous day: 05/09 0701 - 05/10 0700 In: 3033 [P.O.:960; I.V.:1873; IV Piggyback:200] Out: 2375 [Urine:2375] Intake/Output this shift:    General appearance: alert, cooperative, appears stated age and family at bedside Head: Normocephalic, without obvious abnormality, atraumatic Nose: Nares normal. Septum midline. Mucosa normal. No drainage or sinus tenderness. Throat: lips, mucosa, and tongue normal; teeth and gums normal Neck: supple, symmetrical, trachea midline Back: symmetric, no curvature. ROM normal. No CVA tenderness. Resp: no labored breathing Chest wall: no tenderness Cardio: Nl rate GI: soft, non-tender; bowel sounds normal; no masses,  no organomegaly Extremities: extremities normal, atraumatic, no cyanosis or edema Pulses: 2+ and symmetric Skin: Skin color, texture, turgor normal. No rashes or lesions Lymph nodes: Cervical, supraclavicular, and axillary nodes normal. Neurologic: Grossly normal Incision/Wound: Rt neph tube c/d/i. With clear yellow  urine. RLQ Urostomy pink / patent with copious clear urine.   Lab Results:   Recent Labs  07/31/13 0528 08/01/13 0502  WBC 13.1* 10.5  HGB 9.0* 9.0*  HCT 27.0* 27.2*  PLT 202 211   BMET  Recent Labs  07/31/13 0528 08/01/13 0502  NA 139 143  K 5.4* 3.9  CL 108 109  CO2 21 19  GLUCOSE 95 100*  BUN 48* 38*  CREATININE 3.99* 3.10*  CALCIUM 7.7* 7.3*   PT/INR No results found for this basename: LABPROT, INR,  in the last 72 hours ABG No results found for this basename: PHART, PCO2, PO2, HCO3,  in the last 72 hours  Studies/Results: No results found.  Anti-infectives: Anti-infectives   Start     Dose/Rate Route Frequency Ordered Stop   07/30/13 0000  piperacillin-tazobactam (ZOSYN) IVPB 2.25 g     2.25 g 100 mL/hr over 30 Minutes Intravenous 4 times per day 07/29/13 1649     07/29/13 1700  piperacillin-tazobactam (ZOSYN) IVPB 2.25 g     2.25 g 100 mL/hr over 30 Minutes Intravenous NOW 07/29/13 1648 07/29/13 1849   07/28/13 0000  cephALEXin (KEFLEX) 250 MG capsule     250 mg Oral Daily at bedtime 07/28/13 1159     07/27/13 1600  cephALEXin (KEFLEX) capsule 250 mg  Status:  Discontinued     250 mg Oral 3 times daily 07/27/13 1447 07/29/13 1642   07/27/13 0835  ceFAZolin (ANCEF) IVPB 2 g/50 mL premix     2 g 100 mL/hr over 30 Minutes Intravenous 30 min pre-op 07/27/13 0835 07/27/13 1139      Assessment/Plan:  1 - Large Rt Renal Stone - s/p first stage procedure.  Any additional treatment only after back to baseline.   2 - Acute on Chronic Renal Failure - Likely multifactorial with pre-renal on intrinsic renal. Continues to improve with conservative measures / hydration.   3 - Complex Urinary Tract Infection  - Appreciate hospitalist input, agree with Zosyn empiric while final CX pending.  4 - Bladder Cancer - excellent control by most recent imaging.  5 - Remain in house until [facility] final.   [name] 08/01/2013

## 2013-08-01 NOTE — Progress Notes (Signed)
ANTIBIOTIC CONSULT NOTE - INITIAL  Pharmacy Consult for Cipro Indication: UTI  No Known Allergies  Patient Measurements: Height: 5\' 8"  (172.7 cm) Weight: 168 lb (76.204 kg) IBW/kg (Calculated) : 68.4   Vital Signs: Temp: 97.9 F (36.6 C) (05/10 0421) Temp src: Oral (05/10 0421) BP: 136/60 mmHg (05/10 0421) Pulse Rate: 79 (05/10 0421) Intake/Output from previous day: 05/09 0701 - 05/10 0700 In: 3033 [P.O.:960; I.V.:1873; IV Piggyback:200] Out: 2375 [Urine:2375] Intake/Output from this shift: Total I/O In: 240 [P.O.:240] Out: 375 [Urine:375]  Labs:  Recent Labs  07/30/13 0455 07/31/13 0528 08/01/13 0502  WBC 16.7* 13.1* 10.5  HGB 9.2* 9.0* 9.0*  PLT 184 202 211  CREATININE 4.51* 3.99* 3.10*     Assessment: 80 y.o. male who was admitted 07/27/2013 with a diagnosis of right renal and ureteral stones and went to the operating room on 07/27/2013 for R percutaneous nephrolithotomy. He developed a post op fever and was started on Zosyn per pharmacy.  Today, urine cultures revealing Pseudomonas UTI (culture pan-sensitive).  MD switching antibiotics to Cipro x 10 days per ID recommendation.  5/7 >> Zosyn >> 5/10 5/10 >> Cipro >>  Temp: afebrile WBC: improved to WNL Renal: SCr = 3.10 (improving), CrCl ~17 ml/min  Microbiology: 4/22 Urine: proteus (R to amp, cefazlin, ceftriaxone) and E. Coli (I to nitrofurantion) 5/7 Blood: NGTD 5/7 Urine: >100k Pseudomonas aeruginosa (pan-sensitive) 5/7 urine: 85K colonies/mL GNR  Goal of Therapy:  Eradication of infection  Plan:  1.  Cipro 500 mg PO daily x 10 days. 2.  F/u SCr.  Donald Prose Analiyah Lechuga 08/01/2013,10:45 AM

## 2013-08-01 NOTE — Progress Notes (Signed)
[facility] PROGRESS NOTE  BRAVE FORGEY [id number] DOB: 04-Apr-1921 DOA: 07/27/2013  PCP: Jerlyn Ly, MD  Brief HPI: MACEY WEARING is a 80 y.o. male with a past medical history of pacemaker placement for symptomatic bradycardia, history of mild to moderate aortic stenosis by echocardiogram in 2013, hypertension, who has had urological issues for a long time. He's had multiple urological procedures in the last 2-3 weeks including percutaneous nephrostomy tube placement for obstructive hydronephrosis secondary to stone. Urine cultures from April 22 showed Proteus mirabilis and E coli. Proteus was not sensitive to cefazolin. We were consulted as patient developed fever and for fluid management. He was found to new ARF on CKD as well.   Past medical history:  Past Medical History  Diagnosis Date  . Hypertension   . Aortic valve stenosis   . Gout   . Hx of bladder cancer   . Deep venous thrombosis     hx of  LEFT ARM AFTER PACEMAKER INSERTION ABOUT 6 YRS AGO  . Osteoporosis   . Renal failure   . Heart murmur   . Depression   . Chronic kidney disease   . Kidney stone   . Prostate cancer     AND BLADDER CANCER - S/P URETEROILEAL CONDUIT  . Bradycardia     HX OF SYMPTOMATIC BRADYCARDIA - STATUS POST PERMANENT PACEMAKER  . S/P ileal conduit     HX BLADDER AND PROSTATE CANCER  . Macular degeneration     PT STATES HIS VISION STILL OKAY FOR DRIVING  . 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 GREGG TAYLOR -ELECTROPHYSIOLOGIST  . Hydronephrosis with obstructing calculus     RIGHT SIDE - HOSPITALIZED 07-14-13 OT 07-17-13   Primary Service: Urology  Procedures: Multiple Urological procedures.  Antibiotics: Zosyn 5/7-->5/10 Cipro 5/10-->  Subjective: Patient continues to feel well. Denies shortness of breath. Ambulated today.   Objective: Vital Signs  Filed Vitals:   07/31/13 0535 07/31/13 1500 07/31/13 1933 08/01/13 0421  BP: 123/59 155/70 166/75  136/60  Pulse: 76 74 78 79  Temp: 97.1 F (36.2 C) 97.6 F (36.4 C) 97.9 F (36.6 C) 97.9 F (36.6 C)  TempSrc: Oral Oral Oral Oral  Resp: 18 18 20 20   Height:      Weight:      SpO2: 97% 98% 100% 98%    Intake/Output Summary (Last 24 hours) at 08/01/13 1023 Last data filed at 08/01/13 0900  Gross per 24 hour  Intake   3033 ml  Output   2550 ml  Net    483 ml   Filed Weights   07/27/13 1440  Weight: 76.204 kg (168 lb)   General appearance: alert, cooperative, appears stated age and no distress Resp: Few crackles at bases with some clearing with deep respirations. Cardio: regular rate and rhythm, S1, S2 normal, systolic murmur: early systolic 3/6, crescendo throughout the precordium, no click, no rub and no edema GI: soft, non-tender; bowel sounds normal; no masses,  no organomegaly with ileal conduit noted filled with urine Extremities: extremities normal, atraumatic, no cyanosis or edema Neurologic: Alert and oriented x 3. Hard of hearing. No focal deficits.  Lab Results:  Basic Metabolic Panel:  Recent Labs Lab 07/28/13 0635 07/29/13 1725 07/30/13 0455 07/31/13 0528 08/01/13 0502  NA 135* 131* 132* 139 143  K 5.3 4.9 5.3 5.4* 3.9  CL 99 95* 99 108 109  CO2 25 22 20 21 19   GLUCOSE 112* 121* 99 95 100*  BUN 33* 47* 49* 48* 38*  CREATININE 2.54* 4.67* 4.51* 3.99* 3.10*  CALCIUM 8.2* 7.9* 7.6* 7.7* 7.3*   Liver Function Tests:  Recent Labs Lab 07/29/13 1725 07/30/13 0455  AST 34 25  ALT 16 12  ALKPHOS 85 80  BILITOT 0.2* 0.3  PROT 6.3 6.1  ALBUMIN 2.4* 2.2*   CBC:  Recent Labs Lab 07/28/13 0635 07/29/13 1725 07/30/13 0455 07/31/13 0528 08/01/13 0502  WBC  --  19.3* 16.7* 13.1* 10.5  NEUTROABS  --  16.3*  --   --   --   HGB 10.7* 9.2* 9.2* 9.0* 9.0*  HCT 32.9* 28.2* 27.9* 27.0* 27.2*  MCV  --  95.3 94.3 94.1 93.8  PLT  --  176 184 202 211    Studies/Results: No results found.  Medications:  Scheduled: . acidophilus  1 capsule Oral  Daily  . amLODipine  2.5 mg Oral q morning - 10a  . docusate sodium  100 mg Oral BID  . mupirocin ointment  1 application Nasal BID  . piperacillin-tazobactam (ZOSYN)  IV  2.25 g Intravenous 4 times per day  . sodium chloride  250 mL Intravenous Once   Continuous: . sodium chloride 80 mL/hr at 07/31/13 2149   [id number], diphenhydrAMINE, diphenhydrAMINE, docusate sodium, HYDROcodone-acetaminophen, HYDROcodone-acetaminophen, HYDROmorphone (DILAUDID) injection, ondansetron  Assessment/Plan:  Principal Problem:   ARF (acute renal failure) Active Problems:   Hypertension   Pacemaker   Obstructive uropathy   Aortic stenosis   Renal stone   CKD (chronic kidney disease)   Fever, unspecified   UTI (urinary tract infection)    Acute renal failure on chronic kidney disease His baseline creatinine appears to be around 2.4. Renal function was significantly worse on 5/7. Started on IVF and creatinine is improving. Potassium was high 5/9 and was given Kayexalate. Normal today. Cut back on IVF. UA reviewed. The etiology is thought to be due to hypovolemia. However injury to the kidney from mass effect of the hematoma, as well as obstructive uropathy, is also contributing. Patient is progressing well. Anticipate renal function back to baseline in 1-2 days.  UTI/Fever with leukocytosis Urine from nephrostomy is growing Pseudomonas sensitive to Cipro. Discussed with Dr. Linus Salmons and he recommends Oral Cipro for 10 days. Will have pharmacy dose due to renal failure. Stop Zosyn. Fever has subsided. WBC is normal. Hematoma was probably also contributing to fever. Blood cultures are negative thus far.   Right subcapsular Renal hematoma with mass effect on the right kidney Management per urology. This is typically managed conservatively. The mass effect could be contributing to renal failure. This was discussed with Dr. Jeffie Pollock. Hemoglobin did drop some, but has been stable the last few days. No need  to check Hgb any more.   History of aortic stenosis, and pacemaker These issues appear to be stable. Caution with fluids. His respiratory and cardiac status are stable.  Hyponatremia Now normal. Was most likely due to hypovolemia.   Normocytic anemia He likely has underlying anemia of chronic disease, but probably made worse due to acute blood loss as a result of the renal subcapsular hematoma. Hgb stable.  Nephrolithiasis Management per urology.   Code Status: Full code  DVT Prophylaxis: SCDs  Family Communication: Discussed with the patient and his son Disposition Plan: Patient is deconditioned. PT and OT are following.  Patient is improving satisfactorily. TR[facility] will continue to follow daily for now but anticipate signing off 5/11. Thanks for the consult.    LOS: 5 days  Bonnielee Haff  [facility] Pager [phone] 08/01/2013, 10:23 AM  If 8PM-8AM, please contact night-coverage at [email or web address], password [facility]

## 2013-08-02 ENCOUNTER — Inpatient Hospital Stay (HOSPITAL_COMMUNITY): Payer: Medicare Other

## 2013-08-02 LAB — BASIC METABOLIC PANEL
BUN: 30 mg/dL — AB (ref 6–23)
CALCIUM: 7.9 mg/dL — AB (ref 8.4–10.5)
CO2: 22 mEq/L (ref 19–32)
CREATININE: 2.67 mg/dL — AB (ref 0.50–1.35)
Chloride: 111 mEq/L (ref 96–112)
GFR calc Af Amer: 23 mL/min — ABNORMAL LOW (ref >90)
GFR, EST NON AFRICAN AMERICAN: 20 mL/min — AB (ref >90)
GLUCOSE: 98 mg/dL (ref 70–99)
Potassium: 4.3 mEq/L (ref 3.7–5.3)
Sodium: 143 mEq/L (ref 137–147)

## 2013-08-02 LAB — URINE CULTURE
Colony Count: 85000
Special Requests: NORMAL

## 2013-08-02 MED ORDER — AMLODIPINE BESYLATE 2.5 MG PO TABS: 5.0000 mg | ORAL_TABLET | Freq: Every morning | ORAL | Status: DC

## 2013-08-02 MED ORDER — IOHEXOL 300 MG/ML  SOLN
25.0000 mL | Freq: Once | INTRAMUSCULAR | Status: AC | PRN
Start: 2013-08-02 — End: 2013-08-02
  Administered 2013-08-02: 10 mL

## 2013-08-02 MED ORDER — AMLODIPINE BESYLATE 5 MG PO TABS
5.0000 mg | ORAL_TABLET | Freq: Every morning | ORAL | Status: DC
  Administered 2013-08-03 – 2013-08-04 (×2): 5 mg via ORAL
  Filled 2013-08-02 (×2): qty 1

## 2013-08-02 NOTE — Progress Notes (Addendum)
[facility] PROGRESS NOTE  [name] [name] [id number] DOB: [birth year] DOA: 07/27/2013  PCP: Jerlyn Ly, MD  Brief HPI: [name] [name] is a 80 y.o. male with a past medical history of pacemaker placement for symptomatic bradycardia, history of mild to moderate aortic stenosis by echocardiogram in 2013, hypertension, who has had urological issues for a long time. He's had multiple urological procedures in the last 2-3 weeks including percutaneous nephrostomy tube placement for obstructive hydronephrosis secondary to stone. Urine cultures from April 22 showed Proteus mirabilis and E coli. Proteus was not sensitive to cefazolin. We were consulted as patient developed fever and for fluid management. He was found to new ARF on CKD as well.   Past medical history:  Past Medical History  Diagnosis Date  . Hypertension   . Aortic valve stenosis   . Gout   . Hx of bladder cancer   . Deep venous thrombosis     hx of  LEFT ARM AFTER PACEMAKER INSERTION ABOUT 6 YRS AGO  . Osteoporosis   . Renal failure   . Heart murmur   . Depression   . Chronic kidney disease   . Kidney stone   . Prostate cancer     AND BLADDER CANCER - S/P URETEROILEAL CONDUIT  . Bradycardia     HX OF SYMPTOMATIC BRADYCARDIA - STATUS POST PERMANENT PACEMAKER  . S/P ileal conduit     HX BLADDER AND PROSTATE CANCER  . Macular degeneration     PT STATES HIS VISION STILL OKAY FOR DRIVING  . 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 GREGG TAYLOR -ELECTROPHYSIOLOGIST  . Hydronephrosis with obstructing calculus     RIGHT SIDE - HOSPITALIZED 07-14-13 OT 07-17-13   Primary Service: Urology  Procedures: Multiple Urological procedures.  Antibiotics: Zosyn 5/7-->5/10 Cipro 5/10-->  Subjective: Patient continues to feel well. No complaints.   Objective: Vital Signs  Filed Vitals:   08/01/13 1326 08/01/13 2124 08/01/13 2250 08/02/13 0445  BP: 153/76 171/80 164/80 168/76  Pulse: 82 76 85 74    Temp: 98.1 F (36.7 C) 97.7 F (36.5 C)  98.4 F (36.9 C)  TempSrc: Oral Oral  Oral  Resp: 20 20  18   Height:      Weight:      SpO2: 99% 99%  98%    Intake/Output Summary (Last 24 hours) at 08/02/13 0857 Last data filed at 08/02/13 0700  Gross per 24 hour  Intake 2223.33 ml  Output   3675 ml  Net -1451.67 ml   Filed Weights   07/27/13 1440  Weight: 76.204 kg (168 lb)   General appearance: alert, cooperative, appears stated age and no distress Resp: Clear to auscultation bilaterally Cardio: regular rate and rhythm, S1, S2 normal, systolic murmur: early systolic 3/6, crescendo throughout the precordium, no click, no rub and no edema GI: soft, non-tender; bowel sounds normal; no masses,  no organomegaly with ileal conduit noted filled with urine. Dressing on back. Extremities: extremities normal, atraumatic, no cyanosis or edema Neurologic: Alert and oriented x 3. Hard of hearing. No focal deficits.  Lab Results:  Basic Metabolic Panel:  Recent Labs Lab 07/29/13 1725 07/30/13 0455 07/31/13 0528 08/01/13 0502 08/02/13 0350  NA 131* 132* 139 143 143  K 4.9 5.3 5.4* 3.9 4.3  CL 95* 99 108 109 111  CO2 22 20 21 19 22   GLUCOSE 121* 99 95 100* 98  BUN 47* 49* 48* 38* 30*  CREATININE 4.67* 4.51* 3.99* 3.10* 2.67*  CALCIUM 7.9* 7.6* 7.7* 7.3* 7.9*   Liver Function Tests:  Recent Labs Lab 07/29/13 1725 07/30/13 0455  AST 34 25  ALT 16 12  ALKPHOS 85 80  BILITOT 0.2* 0.3  PROT 6.3 6.1  ALBUMIN 2.4* 2.2*   CBC:  Recent Labs Lab 07/28/13 0635 07/29/13 1725 07/30/13 0455 07/31/13 0528 08/01/13 0502  WBC  --  19.3* 16.7* 13.1* 10.5  NEUTROABS  --  16.3*  --   --   --   HGB 10.7* 9.2* 9.2* 9.0* 9.0*  HCT 32.9* 28.2* 27.9* 27.0* 27.2*  MCV  --  95.3 94.3 94.1 93.8  PLT  --  176 184 202 211    Studies/Results: No results found.  Medications:  Scheduled: . acidophilus  1 capsule Oral Daily  . amLODipine  2.5 mg Oral q morning - 10a  . ciprofloxacin   500 mg Oral Q breakfast  . docusate sodium  100 mg Oral BID  . mupirocin ointment  1 application Nasal BID  . sodium chloride  250 mL Intravenous Once   Continuous: . sodium chloride 40 mL/hr at 08/01/13 1105   [id number], diphenhydrAMINE, diphenhydrAMINE, docusate sodium, HYDROcodone-acetaminophen, HYDROcodone-acetaminophen, HYDROmorphone (DILAUDID) injection, ondansetron  Assessment/Plan:  Principal Problem:   ARF (acute renal failure) Active Problems:   Hypertension   Pacemaker   Obstructive uropathy   Aortic stenosis   Renal stone   CKD (chronic kidney disease)   Fever, unspecified   UTI (urinary tract infection)    Acute renal failure on chronic kidney disease His baseline creatinine appears to be around 2.4. Renal function was significantly worse on 5/7. Started on IVF and creatinine is improving and almost close to baseline. Potassium was high 5/9 and was given Kayexalate with good response. Decrease IVF to 70ml/hr. UA reviewed. The etiology is thought to be due to hypovolemia. However injury to the kidney from mass effect of the hematoma, as well as obstructive uropathy, is also contributing. Patient is progressing well. Anticipate renal function back to baseline in 1-2 days.  UTI/Fever with leukocytosis Urine from nephrostomy is growing Pseudomonas sensitive to Cipro. Urine from ileal conduit also growing same organism but only 85,000 colonies which is Intermediate sens to Cipro. Discussed with Dr. Linus Salmons again today. He recommends continuing Oral Cipro for 10 days. Patient to have close follow up which Urology will be doing. Zosyn was stopped 5/10. Fever has subsided. WBC is normal. Hematoma was probably also contributing to fever. Blood cultures are negative thus far.   Right subcapsular Renal hematoma with mass effect on the right kidney Management per urology. This is typically managed conservatively. The mass effect could be contributing to renal failure. This was  discussed with Dr. Jeffie Pollock. Hemoglobin did drop some, but has been stable the last few days. No need to check Hgb any more.   History of aortic stenosis, and pacemaker These issues appear to be stable. Caution with fluids. His respiratory and cardiac status are stable. BP noted to be elevated. Will increase dose of Amlodipine.  Hyponatremia Now normal. Was most likely due to hypovolemia.   Normocytic anemia He likely has underlying anemia of chronic disease, but probably made worse due to acute blood loss as a result of the renal subcapsular hematoma. Hgb stable.  Nephrolithiasis Management per urology.   Code Status: Full code  DVT Prophylaxis: SCDs  Family Communication: Discussed with the patient and his son Disposition Plan: Patient is deconditioned. PT and OT are following.  Patient is improving satisfactorily and renal  function is now close to his baseline. Anticipate renal function will continue to improve. [facility] will sign off at this time. Attempted to reach Dr. Jeffie Pollock with no success.   Thank you for the consult.    LOS: 6 days   [facility] Pager [phone] 08/02/2013, 8:57 AM  If 8PM-8AM, please contact night-coverage at [email or web address], password [facility]

## 2013-08-02 NOTE — Progress Notes (Signed)
Physical Therapy Treatment Patient Details Name: [name] [name] MRN: [id number] DOB: 1921-04-22 Today's Date: 08/02/2013    History of Present Illness 80 yo male admitted with renal stone. s/p percutaneous nephrolithiotomy 5/5.     PT Comments    Pt improving with gait and did not use assistive device today with min/guard for safety.  Pt also performed standing LE exercises with UE support.   Follow Up Recommendations  Home health PT;Supervision/Assistance - 24 hour     Equipment Recommendations  None recommended by PT    Recommendations for Other Services       Precautions / Restrictions Precautions Precautions: Fall Precaution Comments: R sided drain    Mobility  Bed Mobility               General bed mobility comments: pt up in recliner on arrival  Transfers Overall transfer level: Needs assistance Equipment used: Rolling walker (2 wheeled) Transfers: Sit to/from Stand Sit to Stand: Supervision         General transfer comment: verbal cues for hand placement.  Ambulation/Gait Ambulation/Gait assistance: Min guard Ambulation Distance (Feet): 400 Feet   Gait Pattern/deviations: Decreased stride length Gait velocity: decreased   General Gait Details: pt used IV pole 200 feet and then no UE support for last 200 feet, improved gait since last visit   Stairs            Wheelchair Mobility    Modified Rankin (Stroke Patients Only)       Balance                                    Cognition Arousal/Alertness: Awake/alert Behavior During Therapy: WFL for tasks assessed/performed Overall Cognitive Status: Within Functional Limits for tasks assessed                      Exercises General Exercises - Lower Extremity Hip ABduction/ADduction: AROM;Standing;15 reps;Both Hip Flexion/Marching: AROM;Both;15 reps;Standing Heel Raises: AROM;Standing;Both;15 reps Mini-Sqauts: AROM;Both;10 reps Other Exercises Other  Exercises: ham curls standing bilaterally x15 Other Exercises: all standing exercises performed with UE support    General Comments        Pertinent Vitals/Pain No complaints    Home Living                      Prior Function            PT Goals (current goals can now be found in the care plan section) Progress towards PT goals: Progressing toward goals    Frequency  Min 3X/week    PT Plan Current plan remains appropriate    Co-evaluation             End of Session   Activity Tolerance: Patient tolerated treatment well Patient left: in chair;with call bell/phone within reach;with family/visitor present     Time: 1444-1500 PT Time Calculation (min): 16 min  Charges:  $Therapeutic Exercise: 8-22 mins                    G Codes:      Junius Argyle 08/02/2013, 3:44 PM Carmelia Bake, PT, DPT 08/02/2013 Pager: [phone]

## 2013-08-02 NOTE — Progress Notes (Addendum)
Occupational Therapy Treatment Patient Details Name: [name] [name] MRN: [id number] DOB: 1921/12/28 Today's Date: 08/02/2013    History of present illness 80 yo male admitted with renal Lillyen Schow. s/p percutaneous nephrolithiotomy 5/5.    OT comments  Pt is doing well and tolerated up to chair well and sponge bath. Goals added for bathing/dressing as pt and daughter with questions regarding LB self care and educated them on AE options.   Follow Up Recommendations  Home health OT;Supervision - Intermittent    Equipment Recommendations  3 in 1 bedside comode    Recommendations for Other Services      Precautions / Restrictions Precautions Precautions: Fall Precaution Comments: R sided drain Restrictions Weight Bearing Restrictions: No       Mobility Bed Mobility Overal bed mobility: Needs Assistance Bed Mobility: Supine to Sit     Supine to sit: Min guard;HOB elevated        Transfers Overall transfer level: Needs assistance Equipment used: Rolling walker (2 wheeled) Transfers: Sit to/from Stand Sit to Stand: Min guard         General transfer comment: verbal cues for hand placement.    Balance                                   ADL                           Toilet Transfer: Stand-pivot;RW;Min guard             General ADL Comments: Pt and daughter with some questions regarding bathing and tub transfers/DME. Set goals for bathing/dressing and worked on bath today. Educated pt and daughter on AE options as pt with difficulty reaching all the way down to his feet. He can reach down to calf area. Educated him that a long handled sponge woudl be helpful for washing lower legs and feet. Also demonstrated sock aid for pt and daughter and explained coverage of kit. They are considering the kit versus daugther assisting initially. Daughter asking about tub transfer bench as pt has been sponge bathing for awhile. Explained tubbench and  benefit and askede if they would like OT to request tubbench. They will think about this option and obtain one on their own if they decide to get one.  Min verbal cues with safety with transfers today and hand placement. Pt tends to pull up on walker to stand.       Vision                     Perception     Praxis      Cognition   Behavior During Therapy: WFL for tasks assessed/performed Overall Cognitive Status: Within Functional Limits for tasks assessed                       Extremity/Trunk Assessment               Exercises     Shoulder Instructions       General Comments      Pertinent Vitals/ Pain       No complaint of  Home Living  Prior Functioning/Environment              Frequency Min 2X/week     Progress Toward Goals  OT Goals(current goals can now be found in the care plan section)  Progress towards OT goals: Progressing toward goals (LB self care goals added)     Plan Discharge plan remains appropriate    Co-evaluation                 End of Session Equipment Utilized During Treatment: Rolling walker   Activity Tolerance Patient tolerated treatment well   Patient Left in chair;with call bell/phone within reach;with family/visitor present   Nurse Communication          Time: 8295-6213 OT Time Calculation (min): 36 min  Charges: OT General Charges $OT Visit: 1 Procedure OT Treatments $Self Care/Home Management : 8-22 mins $Therapeutic Activity: 8-22 mins  [place] [phone] 08/02/2013, 11:45 AM

## 2013-08-02 NOTE — Procedures (Signed)
Right nephrostogram shows patency in the R ureter with some narrowing at the anastomosis to the ileal conduit. There is also reflux up the L ureter.

## 2013-08-02 NOTE — Progress Notes (Signed)
Patient ID: DAUNTAE POEPPING, male   DOB: [birth year], 80 y.o.   MRN: [id number] 6 Days Post-Op  Subjective: Mr. Treichler is improving.  He has no pain or other complaints.  He remains afebrile and his urine is clear with good output.   He is now on oral cipro for pseudomonas.  ROS:  Review of Systems  Constitutional: Negative for fever.  Gastrointestinal: Negative for nausea and abdominal pain.    Anti-infectives: Anti-infectives   Start     Dose/Rate Route Frequency Ordered Stop   08/01/13 1200  ciprofloxacin (CIPRO) tablet 500 mg     500 mg Oral Daily with breakfast 08/01/13 1049 08/11/13 0759   07/30/13 0000  piperacillin-tazobactam (ZOSYN) IVPB 2.25 g  Status:  Discontinued     2.25 g 100 mL/hr over 30 Minutes Intravenous 4 times per day 07/29/13 1649 08/01/13 1028   07/29/13 1700  piperacillin-tazobactam (ZOSYN) IVPB 2.25 g     2.25 g 100 mL/hr over 30 Minutes Intravenous NOW 07/29/13 1648 07/29/13 1849   07/28/13 0000  cephALEXin (KEFLEX) 250 MG capsule     250 mg Oral Daily at bedtime 07/28/13 1159     07/27/13 1600  cephALEXin (KEFLEX) capsule 250 mg  Status:  Discontinued     250 mg Oral 3 times daily 07/27/13 1447 07/29/13 1642   07/27/13 0835  ceFAZolin (ANCEF) IVPB 2 g/50 mL premix     2 g 100 mL/hr over 30 Minutes Intravenous 30 min pre-op 07/27/13 0835 07/27/13 1139      Current Facility-Administered Medications  Medication Dose Route Frequency Provider Last Rate Last Dose  . 0.9 %  sodium chloride infusion   Intravenous Continuous Bonnielee Haff, MD 40 mL/hr at 08/01/13 1105    . acetaminophen (TYLENOL) tablet 650 mg  650 mg Oral Q4H PRN Irine Seal, MD   650 mg at 07/29/13 1444  . acidophilus (RISAQUAD) capsule 1 capsule  1 capsule Oral Daily Irine Seal, MD   1 capsule at 08/01/13 0930  . amLODipine (NORVASC) tablet 2.5 mg  2.5 mg Oral q morning - 10a Irine Seal, MD   2.5 mg at 08/01/13 0930  . ciprofloxacin (CIPRO) tablet 500 mg  500 mg Oral Q breakfast Dara Hoyer, RPH   500 mg at 08/01/13 1255  . diphenhydrAMINE (BENADRYL) injection 12.5 mg  12.5 mg Intravenous Q6H PRN Irine Seal, MD       Or  . diphenhydrAMINE (BENADRYL) 12.5 MG/5ML elixir 12.5 mg  12.5 mg Oral Q6H PRN Irine Seal, MD      . docusate sodium (COLACE) capsule 100 mg  100 mg Oral Daily PRN Irine Seal, MD      . docusate sodium (COLACE) capsule 100 mg  100 mg Oral BID Irine Seal, MD   100 mg at 08/01/13 2237  . HYDROcodone-acetaminophen (NORCO/VICODIN) 5-325 MG per tablet 1 tablet  1 tablet Oral Q6H PRN Irine Seal, MD   1 tablet at 07/28/13 0830  . HYDROcodone-acetaminophen (NORCO/VICODIN) 5-325 MG per tablet 1-2 tablet  1-2 tablet Oral Q4H PRN Irine Seal, MD      . HYDROmorphone (DILAUDID) injection 0.5-1 mg  0.5-1 mg Intravenous Q2H PRN Irine Seal, MD   0.5 mg at 07/27/13 1507  . mupirocin ointment (BACTROBAN) 2 % 1 application  1 application Nasal BID Irine Seal, MD   1 application at [id number]  . ondansetron (ZOFRAN) injection 4 mg  4 mg Intravenous Q4H PRN Irine Seal, MD   4 mg at  07/30/13 1403  . sodium chloride 0.9 % bolus 250 mL  250 mL Intravenous Once Irine Seal, MD         Objective: Vital signs in last 24 hours: Temp:  [97.7 F (36.5 C)-98.4 F (36.9 C)] 98.4 F (36.9 C) (05/11 0445) Pulse Rate:  [74-85] 74 (05/11 0445) Resp:  [18-20] 18 (05/11 0445) BP: (153-171)/(76-80) 168/76 mmHg (05/11 0445) SpO2:  [98 %-99 %] 98 % (05/11 0445)  Intake/Output from previous day: 05/10 0701 - 05/11 0700 In: 2223.3 [P.O.:1020; I.V.:1203.3] Out: 3675 [Urine:3675] Intake/Output this shift:     Physical Exam  Constitutional: He is well-developed, well-nourished, and in no distress. No distress.  Cardiovascular: Normal rate and regular rhythm.   Pulmonary/Chest: Effort normal and breath sounds normal. No respiratory distress.  Abdominal: Soft. He exhibits no distension. There is no tenderness.  Musculoskeletal: He exhibits edema (mild residual in hands. ).    Lab  Results:   Recent Labs  07/31/13 0528 08/01/13 0502  WBC 13.1* 10.5  HGB 9.0* 9.0*  HCT 27.0* 27.2*  PLT 202 211   BMET  Recent Labs  08/01/13 0502 08/02/13 0350  NA 143 143  K 3.9 4.3  CL 109 111  CO2 19 22  GLUCOSE 100* 98  BUN 38* 30*  CREATININE 3.10* 2.67*  CALCIUM 7.3* 7.9*   PT/INR No results found for this basename: LABPROT, INR,  in the last 72 hours ABG No results found for this basename: PHART, PCO2, PO2, HCO3,  in the last 72 hours  Studies/Results: No results found.   Assessment: s/p Procedure(s): RIGHT PERCUTANEOUS NEPHROLITHOTOMY  Sepsis with ARI ABL anemia.   He continues to improve with a declining creatinine. He is afebrile.  Plan: I am going to get a right antegrade nephrostogram today and that will determine whether I will have IR place a ureteroileal stent or just remove the NT.       LOS: 6 days    Irine Seal 08/02/2013

## 2013-08-03 ENCOUNTER — Encounter (HOSPITAL_COMMUNITY): Payer: Self-pay | Admitting: Radiology

## 2013-08-03 LAB — BASIC METABOLIC PANEL
BUN: 25 mg/dL — AB (ref 6–23)
CO2: 24 mEq/L (ref 19–32)
CREATININE: 2.23 mg/dL — AB (ref 0.50–1.35)
Calcium: 8.3 mg/dL — ABNORMAL LOW (ref 8.4–10.5)
Chloride: 110 mEq/L (ref 96–112)
GFR calc Af Amer: 28 mL/min — ABNORMAL LOW (ref >90)
GFR, EST NON AFRICAN AMERICAN: 24 mL/min — AB (ref >90)
Glucose, Bld: 105 mg/dL — ABNORMAL HIGH (ref 70–99)
Potassium: 4.4 mEq/L (ref 3.7–5.3)
Sodium: 144 mEq/L (ref 137–147)

## 2013-08-03 MED ORDER — CEFAZOLIN SODIUM-DEXTROSE 2-3 GM-% IV SOLR
2.0000 g | INTRAVENOUS | Status: AC
  Administered 2013-08-04: 2 g via INTRAVENOUS

## 2013-08-03 MED ORDER — CEFAZOLIN SODIUM-DEXTROSE 2-3 GM-% IV SOLR
2.0000 g | Freq: Once | INTRAVENOUS | Status: DC
  Filled 2013-08-03: qty 50

## 2013-08-03 NOTE — Progress Notes (Signed)
Patient ID: [name] [name], male   DOB: 31-Oct-1921, 80 y.o.   MRN: [id number] 7 Days Post-Op  Subjective: He is doing well today with no fever and further improvement in his renal function.  The right antegrade nephrostogram showed no ureteral stones.  There may be some small fragments in the lower pole calyx but it has a long narrow infundibulum which is unlikely to allow egress of the fragments.    ROS:  Review of Systems  Constitutional: Negative for fever.  Gastrointestinal: Negative for nausea and abdominal pain.    Anti-infectives: Anti-infectives   Start     Dose/Rate Route Frequency Ordered Stop   08/01/13 1200  ciprofloxacin (CIPRO) tablet 500 mg     500 mg Oral Daily with breakfast 08/01/13 1049 08/11/13 0759   07/30/13 0000  piperacillin-tazobactam (ZOSYN) IVPB 2.25 g  Status:  Discontinued     2.25 g 100 mL/hr over 30 Minutes Intravenous 4 times per day 07/29/13 1649 08/01/13 1028   07/29/13 1700  piperacillin-tazobactam (ZOSYN) IVPB 2.25 g     2.25 g 100 mL/hr over 30 Minutes Intravenous NOW 07/29/13 1648 07/29/13 1849   07/28/13 0000  cephALEXin (KEFLEX) 250 MG capsule     250 mg Oral Daily at bedtime 07/28/13 1159     07/27/13 1600  cephALEXin (KEFLEX) capsule 250 mg  Status:  Discontinued     250 mg Oral 3 times daily 07/27/13 1447 07/29/13 1642   07/27/13 0835  ceFAZolin (ANCEF) IVPB 2 g/50 mL premix     2 g 100 mL/hr over 30 Minutes Intravenous 30 min pre-op 07/27/13 0835 07/27/13 1139      Current Facility-Administered Medications  Medication Dose Route Frequency Provider Last Rate Last Dose  . 0.9 %  sodium chloride infusion   Intravenous Continuous Bonnielee Haff, MD 20 mL/hr at 08/02/13 1100    . acetaminophen (TYLENOL) tablet 650 mg  650 mg Oral Q4H PRN Irine Seal, MD   650 mg at 07/29/13 1444  . acidophilus (RISAQUAD) capsule 1 capsule  1 capsule Oral Daily Irine Seal, MD   1 capsule at 08/02/13 1035  . amLODipine (NORVASC) tablet 5 mg  5 mg Oral q  morning - 10a Bonnielee Haff, MD      . ciprofloxacin (CIPRO) tablet 500 mg  500 mg Oral Q breakfast Dara Hoyer, RPH   500 mg at 08/03/13 0816  . diphenhydrAMINE (BENADRYL) injection 12.5 mg  12.5 mg Intravenous Q6H PRN Irine Seal, MD       Or  . diphenhydrAMINE (BENADRYL) 12.5 MG/5ML elixir 12.5 mg  12.5 mg Oral Q6H PRN Irine Seal, MD      . docusate sodium (COLACE) capsule 100 mg  100 mg Oral Daily PRN Irine Seal, MD      . docusate sodium (COLACE) capsule 100 mg  100 mg Oral BID Irine Seal, MD   100 mg at 08/02/13 2122  . HYDROcodone-acetaminophen (NORCO/VICODIN) 5-325 MG per tablet 1 tablet  1 tablet Oral Q6H PRN Irine Seal, MD   1 tablet at 07/28/13 0830  . HYDROcodone-acetaminophen (NORCO/VICODIN) 5-325 MG per tablet 1-2 tablet  1-2 tablet Oral Q4H PRN Irine Seal, MD      . HYDROmorphone (DILAUDID) injection 0.5-1 mg  0.5-1 mg Intravenous Q2H PRN Irine Seal, MD   0.5 mg at 07/27/13 1507  . mupirocin ointment (BACTROBAN) 2 % 1 application  1 application Nasal BID Irine Seal, MD   1 application at [id number]  . ondansetron (ZOFRAN)  injection 4 mg  4 mg Intravenous Q4H PRN Irine Seal, MD   4 mg at 07/30/13 1403  . sodium chloride 0.9 % bolus 250 mL  250 mL Intravenous Once Irine Seal, MD         Objective: Vital signs in last 24 hours: Temp:  [98 F (36.7 C)-98.4 F (36.9 C)] 98.4 F (36.9 C) (05/12 0439) Pulse Rate:  [73-75] 73 (05/12 0439) Resp:  [18-20] 18 (05/12 0439) BP: (161-176)/(71-76) 167/73 mmHg (05/12 0439) SpO2:  [98 %-100 %] 98 % (05/12 0439)  Intake/Output from previous day: 05/11 0701 - 05/12 0700 In: 560 [P.O.:240; I.V.:320] Out: 3050 [Urine:3050] Intake/Output this shift:     Physical Exam  Constitutional: He is well-developed, well-nourished, and in no distress. No distress.  Cardiovascular: Normal rate and regular rhythm.   Murmur heard. Pulmonary/Chest: Effort normal and breath sounds normal.  Abdominal: Soft. Bowel sounds are normal. There is no  tenderness.    Lab Results:   Recent Labs  08/01/13 0502  WBC 10.5  HGB 9.0*  HCT 27.2*  PLT 211   BMET  Recent Labs  08/02/13 0350 08/03/13 0420  NA 143 144  K 4.3 4.4  CL 111 110  CO2 22 24  GLUCOSE 98 105*  BUN 30* 25*  CREATININE 2.67* 2.23*  CALCIUM 7.9* 8.3*   PT/INR No results found for this basename: LABPROT, INR,  in the last 72 hours ABG No results found for this basename: PHART, PCO2, PO2, HCO3,  in the last 72 hours  Studies/Results: I have reviewed the nephrostogram and discussed the case with Dr. Lenox Ahr.  He concurs with the present of a short right anastomotic stricture.  Assessment: s/p Procedure(s): RIGHT PERCUTANEOUS NEPHROLITHOTOMY   He continues to improve but has a short right anastomotic stricture that places him at risk for recurrent obstruction from even a small stone.   Plan: I am going to make him NPO and will have him set up for placement of an externalized single J stent through the conduit.     LOS: 7 days    Irine Seal 08/03/2013

## 2013-08-03 NOTE — H&P (Signed)
[name] [name] is an 80 y.o. male.   Chief Complaint: Dr Jeffie Pollock has asked for consult for conversion of Rt PCN:  to Nephroureteral catheter into ileal conduit placement Dr Barbie Banner has reviewed imaging and has discussed with Dr Jeffie Pollock Plan is to move ahead with procedure  Pt with Hx bladder ca with cystectomy/ ileal conduit Has existing Perc nephrostomy in place: output great with 1.4 liter yellow urine 5/11 Existing ileal conduit: output great with 1.5 liter yellow urine 5/11 Bun/Cr trending down: 25/2.23 today Rt nephrostogram 5/11 reveals patent ureter with stricture at distal anastomosis to ileal conduit.  HPI: Bladder ca; renal stones; HTN; CKD; prostate ca; mac degeneration; pacemaker  Past Medical History  Diagnosis Date  . Hypertension   . Aortic valve stenosis   . Gout   . Hx of bladder cancer   . Deep venous thrombosis     hx of  LEFT ARM AFTER PACEMAKER INSERTION ABOUT 6 YRS AGO  . Osteoporosis   . Renal failure   . Heart murmur   . Depression   . Chronic kidney disease   . Kidney stone   . Prostate cancer     AND BLADDER CANCER - S/P URETEROILEAL CONDUIT  . Bradycardia     HX OF SYMPTOMATIC BRADYCARDIA - STATUS POST PERMANENT PACEMAKER  . S/P ileal conduit     HX BLADDER AND PROSTATE CANCER  . Macular degeneration     PT STATES HIS VISION STILL OKAY FOR DRIVING  . 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 GREGG TAYLOR -ELECTROPHYSIOLOGIST  . Hydronephrosis with obstructing calculus     RIGHT SIDE - HOSPITALIZED 07-14-13 OT 07-17-13    Past Surgical History  Procedure Laterality Date  . Insert / replace / remove pacemaker    . Cystectomy    . Lymphadenectomy    . Prostate surgery    . Appendectomy    . Cataract extraction, bilateral    . Kidney stone surgery    . Ilial conduit      for bladder cancer  . Nephrolithotomy  09/02/2011    Procedure: NEPHROLITHOTOMY PERCUTANEOUS;  Surgeon: Malka So, MD;  Location: [facility];  Service: Urology;   Laterality: Left;  . 07/20/13  conversion of right sided nephrostomy catheter to right sided nephro ureteral catheter - done in interventional radiology    . Nephrolithotomy Right 07/27/2013    Procedure: RIGHT PERCUTANEOUS NEPHROLITHOTOMY ;  Surgeon: Irine Seal, MD;  Location: [facility];  Service: Urology;  Laterality: Right;    Family History  Problem Relation Age of Onset  . Prostate cancer Father 26    died  . Pancreatic cancer Mother 61    died   Social History:  reports that he quit smoking about 61 years ago. His smoking use included Cigarettes. He smoked 0.00 packs per day. He has never used smokeless tobacco. He reports that he does not drink alcohol or use illicit drugs.  Allergies: No Known Allergies  Medications Prior to Admission  Medication Sig Dispense Refill  . acidophilus (RISAQUAD) CAPS capsule Take 1 capsule by mouth daily.      Marland Kitchen aspirin 81 MG tablet Take 1 tablet (81 mg total) by mouth daily.  30 tablet    . cholecalciferol (VITAMIN D) 1000 UNITS tablet Take 1,000 Units by mouth daily.      Marland Kitchen docusate sodium (COLACE) 100 MG capsule Take 100 mg by mouth daily as needed for mild constipation.       Marland Kitchen  Multiple Vitamins-Minerals (PRESERVISION AREDS 2 PO) Take 1 tablet by mouth 2 (two) times daily.      . mupirocin ointment (BACTROBAN) 2 % Place 1 application into the nose 2 (two) times daily.      . vitamin C (ASCORBIC ACID) 500 MG tablet Take 500 mg by mouth daily.      . vitamin E 400 UNIT capsule Take 400 Units by mouth daily.      . [DISCONTINUED] amLODipine (NORVASC) 2.5 MG tablet Take 2.5 mg by mouth every morning.       . [DISCONTINUED] cephALEXin (KEFLEX) 250 MG capsule Take 1 capsule (250 mg total) by mouth 3 (three) times daily.  30 capsule  0  . [DISCONTINUED] HYDROcodone-acetaminophen (NORCO/VICODIN) 5-325 MG per tablet Take 1 tablet by mouth every 6 (six) hours as needed for moderate pain.  30 tablet  0    Results for orders placed during the hospital encounter  of 07/27/13 (from the past 48 hour(s))  BASIC METABOLIC PANEL     Status: Abnormal   Collection Time    08/02/13  3:50 AM      Result Value Ref Range   Sodium 143  137 - 147 mEq/L   Potassium 4.3  3.7 - 5.3 mEq/L   Chloride 111  96 - 112 mEq/L   CO2 22  19 - 32 mEq/L   Glucose, Bld 98  70 - 99 mg/dL   BUN 30 (*) 6 - 23 mg/dL   Creatinine, Ser 2.67 (*) 0.50 - 1.35 mg/dL   Calcium 7.9 (*) 8.4 - 10.5 mg/dL   GFR calc non Af Amer 20 (*) >90 mL/min   GFR calc Af Amer 23 (*) >90 mL/min   Comment: (NOTE)     The eGFR has been calculated using the CKD EPI equation.     This calculation has not been validated in all clinical situations.     eGFR's persistently <90 mL/min signify possible Chronic Kidney     Disease.  BASIC METABOLIC PANEL     Status: Abnormal   Collection Time    08/03/13  4:20 AM      Result Value Ref Range   Sodium 144  137 - 147 mEq/L   Potassium 4.4  3.7 - 5.3 mEq/L   Chloride 110  96 - 112 mEq/L   CO2 24  19 - 32 mEq/L   Glucose, Bld 105 (*) 70 - 99 mg/dL   BUN 25 (*) 6 - 23 mg/dL   Creatinine, Ser 2.23 (*) 0.50 - 1.35 mg/dL   Calcium 8.3 (*) 8.4 - 10.5 mg/dL   GFR calc non Af Amer 24 (*) >90 mL/min   GFR calc Af Amer 28 (*) >90 mL/min   Comment: (NOTE)     The eGFR has been calculated using the CKD EPI equation.     This calculation has not been validated in all clinical situations.     eGFR's persistently <90 mL/min signify possible Chronic Kidney     Disease.   Ir Nephrostogram Right  08/03/2013   CLINICAL DATA:  Right nephrostomy in place.  EXAM: RIGHT NEPHROSTOGRAM  FLUOROSCOPY TIME:  18 seconds.  MEDICATIONS AND MEDICAL HISTORY: None.  ANESTHESIA/SEDATION: None.  CONTRAST:  10 cc Omnipaque 300  PROCEDURE: The procedure, risks, benefits, and alternatives were explained to the patient. Questions regarding the procedure were encouraged and answered. The patient understands and consents to the procedure.  Contrast was injected into the right nephrostomy and  imaging was obtained.  FINDINGS: The right ureter is normal in course and caliber leading to the ileal conduit. There is patency of the ureter, however there is a stricture at distal anastomosis to the ileal conduit. There is also reflux of contrast into the left ureter.  COMPLICATIONS: None  IMPRESSION: Right ureter is patent, however there is a stricture at the distal anastomosis to the ileal conduit.   Electronically Signed   By: Maryclare Bean M.D.   On: 08/03/2013 09:54    Review of Systems  Constitutional: Positive for weight loss. Negative for fever.  Respiratory: Negative for shortness of breath.   Gastrointestinal: Negative for nausea and vomiting.  Genitourinary: Negative for dysuria.  Musculoskeletal: Negative for back pain.  Neurological: Positive for weakness.    Blood pressure 167/73, pulse 73, temperature 98.4 F (36.9 C), temperature source Oral, resp. rate 18, height $RemoveBe'5\' 8"'[id number]$  (1.727 m), weight 76.204 kg (168 lb), SpO2 98.00%. Physical Exam  Constitutional: He is oriented to person, place, and time. He appears well-nourished.  Cardiovascular: Normal rate and regular rhythm.   No murmur heard. Respiratory: Effort normal and breath sounds normal.  GI: Soft. Bowel sounds are normal. There is no tenderness.  Existing Rt PCN and ileal conduit; both collecting yellow urine  Musculoskeletal: Normal range of motion.  Neurological: He is alert and oriented to person, place, and time.  Skin: Skin is warm and dry.  Psychiatric: He has a normal mood and affect. His behavior is normal. Judgment and thought content normal.     Assessment/Plan Pt scheduled for Rt PCN conversion to: Rt nephroureteral catheter into ileal conduit placement Pt and family aware of procedure benefits and risks and agreeable to proceed Consent signed and in chart   Lavonia Drafts 08/03/2013, 10:25 AM

## 2013-08-04 ENCOUNTER — Inpatient Hospital Stay (HOSPITAL_COMMUNITY): Payer: Medicare Other

## 2013-08-04 LAB — CULTURE, BLOOD (ROUTINE X 2)
CULTURE: NO GROWTH
Culture: NO GROWTH

## 2013-08-04 MED ORDER — CIPROFLOXACIN HCL 250 MG PO TABS: 250.0000 mg | ORAL_TABLET | Freq: Two times a day (BID) | ORAL | Status: AC

## 2013-08-04 MED ORDER — LIDOCAINE HCL 1 % IJ SOLN
INTRAMUSCULAR | Status: AC
  Filled 2013-08-04: qty 20

## 2013-08-04 MED ORDER — MIDAZOLAM HCL 2 MG/2ML IJ SOLN
INTRAMUSCULAR | Status: AC | PRN
  Administered 2013-08-04 (×2): 1 mg via INTRAVENOUS

## 2013-08-04 MED ORDER — FENTANYL CITRATE 0.05 MG/ML IJ SOLN
INTRAMUSCULAR | Status: AC | PRN
  Administered 2013-08-04: 100 ug via INTRAVENOUS

## 2013-08-04 MED ORDER — MIDAZOLAM HCL 2 MG/2ML IJ SOLN
INTRAMUSCULAR | Status: AC
  Filled 2013-08-04: qty 6

## 2013-08-04 MED ORDER — FENTANYL CITRATE 0.05 MG/ML IJ SOLN
INTRAMUSCULAR | Status: AC
  Filled 2013-08-04: qty 6

## 2013-08-04 MED ORDER — IOHEXOL 300 MG/ML  SOLN
50.0000 mL | Freq: Once | INTRAMUSCULAR | Status: AC | PRN
  Administered 2013-08-04: 30 mL

## 2013-08-04 NOTE — Procedures (Signed)
Procedure:  Nephrostogram with conversion of nephrostomy access to retrograde nephroureteral catheter via ileal conduit Findings:  Access obtained down ureter, across ureteral stricture and out ileostomy.  Retrograde 12 Fr catheter advanced through ileal conduit and to level of upper pole collecting system.

## 2013-08-04 NOTE — Progress Notes (Signed)
ANTIBIOTIC CONSULT NOTE - Follow Up  Pharmacy Consult for Cipro Indication: UTI  No Known Allergies  Patient Measurements: Height: 5\' 8"  (172.7 cm) Weight: 168 lb (76.204 kg) IBW/kg (Calculated) : 68.4   Vital Signs: Temp: 98 F (36.7 C) (05/13 0457) Temp src: Oral (05/13 0457) BP: 173/72 mmHg (05/13 0457) Pulse Rate: 74 (05/13 0457) Intake/Output from previous day: 05/12 0701 - 05/13 0700 In: 480 [P.O.:240; I.V.:240] Out: 2500 [Urine:2500] Intake/Output from this shift:    Labs:  Recent Labs  08/02/13 0350 08/03/13 0420  CREATININE 2.67* 2.23*     Assessment: 80 y.o. male who was admitted 07/27/2013 with a diagnosis of right renal and ureteral stones and went to the operating room on 07/27/2013 for R percutaneous nephrolithotomy. He developed a post op fever and was started on Zosyn per pharmacy.  Urine cultures revealed Pseudomonas UTI.  MD switched antibiotics to Cipro x 10 days per ID recommendation.  5/7 >> Zosyn >> 5/10 5/10 >> Cipro >>  Temp: afebrile WBC: improved to WNL Renal: ARF, slowly improving.  SCr = 2.23, CrCl ~22 ml/min  Microbiology: 4/22 Urine: proteus (R to amp, cefazlin, ceftriaxone) and E. Coli (I to nitrofurantion) 5/7 Blood: NGTD 5/7 Urine: >100k Pseudomonas aeruginosa (pan-sensitive) 5/7 urine: 85K P.Aeruginosa - I to cipro, otherwise pansensitive  Day #4/10 Cipro 500 mg PO q24h for Pseudomonal UTI  Goal of Therapy:  Eradication of infection  Plan:  Continue Cipro 500 mg PO q24h while CrCl< 30 ml/min.  Patient going for stent placement today.  If/when patient's CrCl >30 ml/min, recommend adjusting Cipro to 250 mg PO BID.  Donald Prose Tashema Tiller 08/04/2013,8:26 AM

## 2013-08-04 NOTE — Progress Notes (Deleted)
ANTIBIOTIC CONSULT NOTE - Follow Up  Pharmacy Consult for Cipro Indication: UTI  No Known Allergies  Patient Measurements: Height: 5\' 8"  (172.7 cm) Weight: 168 lb (76.204 kg) IBW/kg (Calculated) : 68.4   Vital Signs: Temp: 98 F (36.7 C) (05/13 0457) Temp src: Oral (05/13 0457) BP: 173/72 mmHg (05/13 0457) Pulse Rate: 74 (05/13 0457) Intake/Output from previous day: 05/12 0701 - 05/13 0700 In: 480 [P.O.:240; I.V.:240] Out: 2500 [Urine:2500] Intake/Output from this shift:    Labs:  Recent Labs  08/02/13 0350 08/03/13 0420  CREATININE 2.67* 2.23*     Assessment: 80 y.o. male who was admitted 07/27/2013 with a diagnosis of right renal and ureteral stones and went to the operating room on 07/27/2013 for R percutaneous nephrolithotomy. He developed a post op fever and was started on Zosyn per pharmacy.  Urine cultures revealed Pseudomonas UTI.  MD switched antibiotics to Cipro x 10 days per ID recommendation.  5/7 >> Zosyn >> 5/10 5/10 >> Cipro >>  Temp: afebrile WBC: improved to WNL Renal: ARF, slowly improving.  SCr = 2.23, CrCl ~22 ml/min  Microbiology: 4/22 Urine: proteus (R to amp, cefazlin, ceftriaxone) and E. Coli (I to nitrofurantion) 5/7 Blood: NGTD 5/7 Urine: >100k Pseudomonas aeruginosa (pan-sensitive) 5/7 urine: 85K P.Aeruginosa - I to cipro, otherwise pansensitive  Day #4/10 Cipro 500 mg PO q24h for Pseudomonal UTI  Goal of Therapy:  Eradication of infection  Plan:  Continue Cipro 500 mg PO q24h while CrCl< 30 ml/min.  Patient going for stent placement today.  If/when patient's CrCl >30 ml/min, recommend adjusting Cipro to 500 mg PO BID.  Donald Prose Addalynn Kumari 08/04/2013,8:19 AM

## 2013-08-04 NOTE — Progress Notes (Signed)
Patient ID: NICHOLAI BOERBOOM, male   DOB: 01-May-1921, 80 y.o.   MRN: [id number] 8 Days Post-Op  Subjective: Mr. Anderer is doing well but he didn't get the externalized stent placed yesterday and is to go down this morning.    ROS:  Review of Systems  Constitutional: Negative for fever.  Gastrointestinal: Negative for nausea.  Genitourinary: Negative for flank pain.    Anti-infectives: Anti-infectives   Start     Dose/Rate Route Frequency Ordered Stop   08/04/13 0000  ceFAZolin (ANCEF) IVPB 2 g/50 mL premix    Comments:  Hang ON CALL to xray 5/13   2 g 100 mL/hr over 30 Minutes Intravenous 60 min pre-op 08/03/13 1606     08/03/13 1100  ceFAZolin (ANCEF) IVPB 2 g/50 mL premix  Status:  Discontinued    Comments:  Hang ON CALL to xray   2 g 100 mL/hr over 30 Minutes Intravenous  Once 08/03/13 1047 08/03/13 1606   08/01/13 1200  ciprofloxacin (CIPRO) tablet 500 mg     500 mg Oral Daily with breakfast 08/01/13 1049 08/11/13 0759   07/30/13 0000  piperacillin-tazobactam (ZOSYN) IVPB 2.25 g  Status:  Discontinued     2.25 g 100 mL/hr over 30 Minutes Intravenous 4 times per day 07/29/13 1649 08/01/13 1028   07/29/13 1700  piperacillin-tazobactam (ZOSYN) IVPB 2.25 g     2.25 g 100 mL/hr over 30 Minutes Intravenous NOW 07/29/13 1648 07/29/13 1849   07/28/13 0000  cephALEXin (KEFLEX) 250 MG capsule     250 mg Oral Daily at bedtime 07/28/13 1159     07/27/13 1600  cephALEXin (KEFLEX) capsule 250 mg  Status:  Discontinued     250 mg Oral 3 times daily 07/27/13 1447 07/29/13 1642   07/27/13 0835  ceFAZolin (ANCEF) IVPB 2 g/50 mL premix     2 g 100 mL/hr over 30 Minutes Intravenous 30 min pre-op 07/27/13 0835 07/27/13 1139      Current Facility-Administered Medications  Medication Dose Route Frequency Provider Last Rate Last Dose  . 0.9 %  sodium chloride infusion   Intravenous Continuous Bonnielee Haff, MD 20 mL/hr at 08/02/13 1100    . acetaminophen (TYLENOL) tablet 650 mg  650 mg  Oral Q4H PRN Malka So, MD   650 mg at 07/29/13 1444  . acidophilus (RISAQUAD) capsule 1 capsule  1 capsule Oral Daily Malka So, MD   1 capsule at 08/03/13 1029  . amLODipine (NORVASC) tablet 5 mg  5 mg Oral q morning - 10a Bonnielee Haff, MD   5 mg at 08/03/13 1030  . ceFAZolin (ANCEF) IVPB 2 g/50 mL premix  2 g Intravenous 60 min Pre-Op Lavonia Drafts, PA-C      . ciprofloxacin (CIPRO) tablet 500 mg  500 mg Oral Q breakfast Dara Hoyer, RPH   500 mg at 08/03/13 0816  . diphenhydrAMINE (BENADRYL) injection 12.5 mg  12.5 mg Intravenous Q6H PRN Malka So, MD       Or  . diphenhydrAMINE (BENADRYL) 12.5 MG/5ML elixir 12.5 mg  12.5 mg Oral Q6H PRN Malka So, MD      . docusate sodium (COLACE) capsule 100 mg  100 mg Oral Daily PRN Malka So, MD      . docusate sodium (COLACE) capsule 100 mg  100 mg Oral BID Malka So, MD   100 mg at 08/03/13 2101  . HYDROcodone-acetaminophen (NORCO/VICODIN) 5-325 MG per tablet 1 tablet  1 tablet  Oral Q6H PRN Malka So, MD   1 tablet at 07/28/13 0830  . HYDROcodone-acetaminophen (NORCO/VICODIN) 5-325 MG per tablet 1-2 tablet  1-2 tablet Oral Q4H PRN Malka So, MD      . HYDROmorphone (DILAUDID) injection 0.5-1 mg  0.5-1 mg Intravenous Q2H PRN Malka So, MD   0.5 mg at 07/27/13 1507  . mupirocin ointment (BACTROBAN) 2 % 1 application  1 application Nasal BID Malka So, MD   1 application at [id number]  . ondansetron (ZOFRAN) injection 4 mg  4 mg Intravenous Q4H PRN Malka So, MD   4 mg at 07/30/13 1403  . sodium chloride 0.9 % bolus 250 mL  250 mL Intravenous Once Malka So, MD         Objective: Vital signs in last 24 hours: Temp:  [98 F (36.7 C)-98.4 F (36.9 C)] 98 F (36.7 C) (05/13 0457) Pulse Rate:  [74-75] 74 (05/13 0457) Resp:  [18-19] 19 (05/13 0457) BP: (165-173)/(72-82) 173/72 mmHg (05/13 0457) SpO2:  [97 %-100 %] 97 % (05/13 0457)  Intake/Output from previous day: 05/12 0701 - 05/13 0700 In: 480  [P.O.:240; I.V.:240] Out: 2500 [Urine:2500] Intake/Output this shift:     Physical Exam  Constitutional: He is well-developed, well-nourished, and in no distress.    Lab Results:  No results found for this basename: WBC, HGB, HCT, PLT,  in the last 72 hours BMET  Recent Labs  08/02/13 0350 08/03/13 0420  NA 143 144  K 4.3 4.4  CL 111 110  CO2 22 24  GLUCOSE 98 105*  BUN 30* 25*  CREATININE 2.67* 2.23*  CALCIUM 7.9* 8.3*   PT/INR No results found for this basename: LABPROT, INR,  in the last 72 hours ABG No results found for this basename: PHART, PCO2, PO2, HCO3,  in the last 72 hours  Studies/Results: Ir Nephrostogram Right  08/03/2013   CLINICAL DATA:  Right nephrostomy in place.  EXAM: RIGHT NEPHROSTOGRAM  FLUOROSCOPY TIME:  18 seconds.  MEDICATIONS AND MEDICAL HISTORY: None.  ANESTHESIA/SEDATION: None.  CONTRAST:  10 cc Omnipaque 300  PROCEDURE: The procedure, risks, benefits, and alternatives were explained to the patient. Questions regarding the procedure were encouraged and answered. The patient understands and consents to the procedure.  Contrast was injected into the right nephrostomy and imaging was obtained.  FINDINGS: The right ureter is normal in course and caliber leading to the ileal conduit. There is patency of the ureter, however there is a stricture at distal anastomosis to the ileal conduit. There is also reflux of contrast into the left ureter.  COMPLICATIONS: None  IMPRESSION: Right ureter is patent, however there is a stricture at the distal anastomosis to the ileal conduit.   Electronically Signed   By: Maryclare Bean M.D.   On: 08/03/2013 09:54     Assessment: s/p Procedure(s): RIGHT PERCUTANEOUS NEPHROLITHOTOMY   He is doing well but is due for an externalized stent placement for the anastomotic stricture on the right.  Plan: I hope to discharge him home after the stent has been placed.      LOS: 8 days    Malka So 08/04/2013

## 2013-08-18 ENCOUNTER — Other Ambulatory Visit: Payer: Self-pay | Admitting: Urology

## 2013-08-18 DIAGNOSIS — N135 Crossing vessel and stricture of ureter without hydronephrosis: Secondary | ICD-10-CM

## 2013-09-07 ENCOUNTER — Other Ambulatory Visit (HOSPITAL_COMMUNITY): Payer: Medicare Other

## 2013-09-14 ENCOUNTER — Other Ambulatory Visit (HOSPITAL_COMMUNITY): Payer: Medicare Other

## 2013-09-17 ENCOUNTER — Ambulatory Visit (HOSPITAL_COMMUNITY)
Admission: RE | Admit: 2013-09-17 | Discharge: 2013-09-17 | Disposition: A | Discharge status: Home / Self Care | Payer: Medicare Other | Source: Ambulatory Visit | Attending: Urology | Admitting: Urology

## 2013-09-17 ENCOUNTER — Other Ambulatory Visit: Payer: Self-pay | Admitting: Urology

## 2013-09-17 DIAGNOSIS — N135 Crossing vessel and stricture of ureter without hydronephrosis: Secondary | ICD-10-CM | POA: Insufficient documentation

## 2013-09-17 DIAGNOSIS — Z436 Encounter for attention to other artificial openings of urinary tract: Secondary | ICD-10-CM | POA: Insufficient documentation

## 2013-09-17 MED ORDER — IOHEXOL 300 MG/ML  SOLN
25.0000 mL | Freq: Once | INTRAMUSCULAR | Status: AC | PRN
  Administered 2013-09-17: 15 mL via INTRAVENOUS

## 2013-09-17 NOTE — Procedures (Signed)
Successful right sided retrograde NUS exchange.  No immediate complications.

## 2013-09-28 ENCOUNTER — Other Ambulatory Visit: Payer: Self-pay

## 2013-10-05 ENCOUNTER — Encounter: Payer: Self-pay | Admitting: Internal Medicine

## 2013-10-05 ENCOUNTER — Ambulatory Visit (INDEPENDENT_AMBULATORY_CARE_PROVIDER_SITE_OTHER): Payer: Medicare Other | Admitting: Internal Medicine

## 2013-10-05 VITALS — BP 166/88 | HR 95 | Ht 68.0 in | Wt 167.0 lb

## 2013-10-05 DIAGNOSIS — I10 Essential (primary) hypertension: Secondary | ICD-10-CM

## 2013-10-05 DIAGNOSIS — R001 Bradycardia, unspecified: Secondary | ICD-10-CM

## 2013-10-05 DIAGNOSIS — Z950 Presence of cardiac pacemaker: Secondary | ICD-10-CM

## 2013-10-05 DIAGNOSIS — I498 Other specified cardiac arrhythmias: Secondary | ICD-10-CM

## 2013-10-05 NOTE — Progress Notes (Signed)
HPI [name] returns today for followup. He is a very pleasant 80 year old man with a history of symptomatic bradycardia, status post permanent pacemaker insertion, hypertension,who returns today for followup. In the interim, he has had problems with kidney stones and undergone multiple urologic procedures.  He denies chest pain, shortness of breath, or syncope. No peripheral edema. He has become more sedentary. No Known Allergies   Current Outpatient Prescriptions  Medication Sig Dispense Refill  . amLODipine (NORVASC) 5 MG tablet Take 5 mg by mouth daily.      Marland Kitchen aspirin 81 MG tablet Take 1 tablet (81 mg total) by mouth daily.  30 tablet    . docusate sodium (COLACE) 100 MG capsule Take 100 mg by mouth daily as needed for mild constipation.       . Multiple Vitamins-Minerals (PRESERVISION AREDS 2 PO) Take 1 tablet by mouth 2 (two) times daily.      . vitamin C (ASCORBIC ACID) 500 MG tablet Take 500 mg by mouth daily.      . vitamin E 400 UNIT capsule Take 400 Units by mouth daily.       No current facility-administered medications for this visit.     Past Medical History  Diagnosis Date  . Hypertension   . Aortic valve stenosis   . Gout   . Hx of bladder cancer   . Deep venous thrombosis     hx of  LEFT ARM AFTER PACEMAKER INSERTION ABOUT 6 YRS AGO  . Osteoporosis   . Renal failure   . Heart murmur   . Depression   . Chronic kidney disease   . Kidney stone   . Prostate cancer     AND BLADDER CANCER - S/P URETEROILEAL CONDUIT  . Bradycardia     HX OF SYMPTOMATIC BRADYCARDIA - STATUS POST PERMANENT PACEMAKER  . S/P ileal conduit     HX BLADDER AND PROSTATE CANCER  . Macular degeneration     PT STATES HIS VISION STILL OKAY FOR DRIVING  . 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 Johnjoseph Rolfe -ELECTROPHYSIOLOGIST  . Hydronephrosis with obstructing calculus     RIGHT SIDE - HOSPITALIZED 07-14-13 OT 07-17-13    ROS:   All systems reviewed and negative except  as noted in the HPI.   Past Surgical History  Procedure Laterality Date  . Insert / replace / remove pacemaker    . Cystectomy    . Lymphadenectomy    . Prostate surgery    . Appendectomy    . Cataract extraction, bilateral    . Kidney stone surgery    . Ilial conduit      for bladder cancer  . Nephrolithotomy  09/02/2011    Procedure: NEPHROLITHOTOMY PERCUTANEOUS;  Surgeon: Malka So, MD;  Location: [facility];  Service: Urology;  Laterality: Left;  . 07/20/13  conversion of right sided nephrostomy catheter to right sided nephro ureteral catheter - done in interventional radiology    . Nephrolithotomy Right 07/27/2013    Procedure: RIGHT PERCUTANEOUS NEPHROLITHOTOMY ;  Surgeon: Irine Seal, MD;  Location: [facility];  Service: Urology;  Laterality: Right;     Family History  Problem Relation Age of Onset  . Prostate cancer Father 23    died  . Pancreatic cancer Mother 90    died     History   Social History  . Marital Status: Widowed    Spouse Name: N/A    Number of Children: 3  . Years  of Education: N/A   Occupational History  .     Social History Main Topics  . Smoking status: Former Smoker    Types: Cigarettes    Quit date: 03/25/1952  . Smokeless tobacco: Never Used  . Alcohol Use: No  . Drug Use: No  . Sexual Activity: No   Other Topics Concern  . Not on file   Social History Narrative  . No narrative on file     BP 166/88  Pulse 95  Ht 5\' 8"  (1.727 m)  Wt 167 lb (75.751 kg)  BMI 25.40 kg/m2  Physical Exam:  Well appearing elderly man, NAD HEENT: Unremarkable Neck:  7 cm JVD, no thyromegally Back:  No CVA tenderness Lungs:  Clear with no wheezes, rales, or rhonchi. Well-healed pacemaker incision HEART:  Regular rate rhythm, no murmurs, no rubs, no clicks Abd:  soft, positive bowel sounds, no organomegally, no rebound, no guarding Ext:  2 plus pulses, no edema, no cyanosis, no clubbing Skin:  No rashes no nodules Neuro:  CN II through XII intact,  motor grossly intact  DEVICE  Normal device function.  See PaceArt for details.   Assess/Plan:

## 2013-10-05 NOTE — Patient Instructions (Signed)
Your physician wants you to follow-up in: 12 months with Dr Knox Saliva will receive a reminder letter in the mail two months in advance. If you don't receive a letter, please call our office to schedule the follow-up appointment.  Your physician wants you to follow-up in: 01/04/14 You will receive a reminder letter in the mail two months in advance. If you don't receive a letter, please call our office to schedule the follow-up appointment.

## 2013-10-06 ENCOUNTER — Encounter: Payer: Self-pay | Admitting: Internal Medicine

## 2013-10-06 NOTE — Assessment & Plan Note (Signed)
His PPM is working normally. Will recheck in several months

## 2013-10-06 NOTE — Assessment & Plan Note (Signed)
His blood pressure is elevated. He notes that it is better at home. I have encouraged the patient to reduce his sodium intake. He is encourage to call if his pressure is elevated at home.

## 2013-10-07 LAB — MDC_IDC_ENUM_SESS_TYPE_INCLINIC
Battery Impedance: 496 Ohm
Battery Voltage: 2.79 V
Brady Statistic AP VP Percent: 3 %
Brady Statistic AP VS Percent: 96 %
Brady Statistic AS VS Percent: 1 %
Date Time Interrogation Session: 20150714162030
Lead Channel Impedance Value: 479 Ohm
Lead Channel Impedance Value: 683 Ohm
Lead Channel Pacing Threshold Pulse Width: 0.4 ms
Lead Channel Setting Pacing Amplitude: 2 V
Lead Channel Setting Pacing Amplitude: 2.5 V
MDC IDC MSMT BATTERY REMAINING LONGEVITY: 85 mo
MDC IDC MSMT LEADCHNL RA PACING THRESHOLD AMPLITUDE: 0.5 V
MDC IDC MSMT LEADCHNL RV PACING THRESHOLD AMPLITUDE: 0.5 V
MDC IDC MSMT LEADCHNL RV PACING THRESHOLD PULSEWIDTH: 0.4 ms
MDC IDC MSMT LEADCHNL RV SENSING INTR AMPL: 5.6 mV
MDC IDC SET LEADCHNL RV PACING PULSEWIDTH: 0.4 ms
MDC IDC SET LEADCHNL RV SENSING SENSITIVITY: 2.8 mV
MDC IDC STAT BRADY AS VP PERCENT: 0 %

## 2013-10-28 ENCOUNTER — Emergency Department (HOSPITAL_COMMUNITY)
Admission: EM | Admit: 2013-10-28 | Discharge: 2013-10-28 | Disposition: A | Discharge status: Home / Self Care | Payer: Medicare Other | Attending: Emergency Medicine | Admitting: Emergency Medicine

## 2013-10-28 ENCOUNTER — Encounter (HOSPITAL_COMMUNITY): Payer: Self-pay | Admitting: Emergency Medicine

## 2013-10-28 DIAGNOSIS — R011 Cardiac murmur, unspecified: Secondary | ICD-10-CM | POA: Diagnosis not present

## 2013-10-28 DIAGNOSIS — Z87448 Personal history of other diseases of urinary system: Secondary | ICD-10-CM | POA: Insufficient documentation

## 2013-10-28 DIAGNOSIS — Z8659 Personal history of other mental and behavioral disorders: Secondary | ICD-10-CM | POA: Insufficient documentation

## 2013-10-28 DIAGNOSIS — Z87891 Personal history of nicotine dependence: Secondary | ICD-10-CM | POA: Diagnosis not present

## 2013-10-28 DIAGNOSIS — Z8546 Personal history of malignant neoplasm of prostate: Secondary | ICD-10-CM | POA: Insufficient documentation

## 2013-10-28 DIAGNOSIS — N189 Chronic kidney disease, unspecified: Secondary | ICD-10-CM | POA: Diagnosis not present

## 2013-10-28 DIAGNOSIS — Z8551 Personal history of malignant neoplasm of bladder: Secondary | ICD-10-CM | POA: Diagnosis not present

## 2013-10-28 DIAGNOSIS — I129 Hypertensive chronic kidney disease with stage 1 through stage 4 chronic kidney disease, or unspecified chronic kidney disease: Secondary | ICD-10-CM | POA: Insufficient documentation

## 2013-10-28 DIAGNOSIS — R109 Unspecified abdominal pain: Secondary | ICD-10-CM | POA: Insufficient documentation

## 2013-10-28 DIAGNOSIS — Z936 Other artificial openings of urinary tract status: Secondary | ICD-10-CM

## 2013-10-28 DIAGNOSIS — Z87442 Personal history of urinary calculi: Secondary | ICD-10-CM | POA: Diagnosis not present

## 2013-10-28 DIAGNOSIS — Z950 Presence of cardiac pacemaker: Secondary | ICD-10-CM | POA: Insufficient documentation

## 2013-10-28 DIAGNOSIS — Z8669 Personal history of other diseases of the nervous system and sense organs: Secondary | ICD-10-CM | POA: Diagnosis not present

## 2013-10-28 DIAGNOSIS — Z7982 Long term (current) use of aspirin: Secondary | ICD-10-CM | POA: Diagnosis not present

## 2013-10-28 DIAGNOSIS — Z86718 Personal history of other venous thrombosis and embolism: Secondary | ICD-10-CM | POA: Diagnosis not present

## 2013-10-28 DIAGNOSIS — N390 Urinary tract infection, site not specified: Secondary | ICD-10-CM | POA: Insufficient documentation

## 2013-10-28 DIAGNOSIS — Z79899 Other long term (current) drug therapy: Secondary | ICD-10-CM | POA: Diagnosis not present

## 2013-10-28 LAB — URINALYSIS, ROUTINE W REFLEX MICROSCOPIC
Bilirubin Urine: NEGATIVE
Glucose, UA: NEGATIVE mg/dL
Ketones, ur: NEGATIVE mg/dL
Nitrite: POSITIVE — AB
PH: 8.5 — AB (ref 5.0–8.0)
Protein, ur: 100 mg/dL — AB
SPECIFIC GRAVITY, URINE: 1.016 (ref 1.005–1.030)
Urobilinogen, UA: 0.2 mg/dL (ref 0.0–1.0)

## 2013-10-28 LAB — CBC WITH DIFFERENTIAL/PLATELET
BASOS ABS: 0 10*3/uL (ref 0.0–0.1)
Basophils Relative: 0 % (ref 0–1)
Eosinophils Absolute: 0.3 10*3/uL (ref 0.0–0.7)
Eosinophils Relative: 2 % (ref 0–5)
HCT: 33.2 % — ABNORMAL LOW (ref 39.0–52.0)
Hemoglobin: 11.2 g/dL — ABNORMAL LOW (ref 13.0–17.0)
LYMPHS ABS: 1.8 10*3/uL (ref 0.7–4.0)
Lymphocytes Relative: 15 % (ref 12–46)
MCH: 32.3 pg (ref 26.0–34.0)
MCHC: 33.7 g/dL (ref 30.0–36.0)
MCV: 95.7 fL (ref 78.0–100.0)
Monocytes Absolute: 1.3 10*3/uL — ABNORMAL HIGH (ref 0.1–1.0)
Monocytes Relative: 11 % (ref 3–12)
NEUTROS ABS: 8.9 10*3/uL — AB (ref 1.7–7.7)
Neutrophils Relative %: 72 % (ref 43–77)
PLATELETS: 194 10*3/uL (ref 150–400)
RBC: 3.47 MIL/uL — AB (ref 4.22–5.81)
RDW: 15 % (ref 11.5–15.5)
WBC: 12.3 10*3/uL — ABNORMAL HIGH (ref 4.0–10.5)

## 2013-10-28 LAB — BASIC METABOLIC PANEL
ANION GAP: 11 (ref 5–15)
BUN: 48 mg/dL — ABNORMAL HIGH (ref 6–23)
CO2: 25 meq/L (ref 19–32)
Calcium: 9.5 mg/dL (ref 8.4–10.5)
Chloride: 103 mEq/L (ref 96–112)
Creatinine, Ser: 2.76 mg/dL — ABNORMAL HIGH (ref 0.50–1.35)
GFR calc Af Amer: 22 mL/min — ABNORMAL LOW (ref >90)
GFR calc non Af Amer: 19 mL/min — ABNORMAL LOW (ref >90)
Glucose, Bld: 112 mg/dL — ABNORMAL HIGH (ref 70–99)
Potassium: 5.3 mEq/L (ref 3.7–5.3)
SODIUM: 139 meq/L (ref 137–147)

## 2013-10-28 LAB — URINE MICROSCOPIC-ADD ON

## 2013-10-28 MED ORDER — DEXTROSE 5 % IV SOLN
1.0000 g | Freq: Once | INTRAVENOUS | Status: AC
Start: 2013-10-28 — End: 2013-10-28
  Administered 2013-10-28: 1 g via INTRAVENOUS
  Filled 2013-10-28: qty 10

## 2013-10-28 MED ORDER — CEPHALEXIN 500 MG PO CAPS: 500.0000 mg | ORAL_CAPSULE | Freq: Three times a day (TID) | ORAL | Status: DC

## 2013-10-28 NOTE — ED Provider Notes (Signed)
CSN: [id number]     Arrival date & time 10/28/13  1823 History   First MD Initiated Contact with Patient 10/28/13 1912     Chief Complaint  Patient presents with  . Flank Pain     (Consider location/radiation/quality/duration/timing/severity/associated sxs/prior Treatment) Patient is a 80 y.o. male presenting with flank pain. The history is provided by the patient and medical records.  Flank Pain  This is a 80 y.o. M with PMH significant for HTN, CKD, prostate cancer, kidney stones, bladder cancer, presenting to the ED for right flank pain yesterday which has resolved at this time.  Pt has ileal conduit secondary to above issues, has been having normal urine output over the past few days.  Denies discoloration of urine or hematuria.  Denies fever, chills, sweats, nausea, vomiting, diarrhea.  Has been tolerating PO without difficulty today.  No changes in mental status or confusion.  VS stable on arrival.  Past Medical History  Diagnosis Date  . Hypertension   . Aortic valve stenosis   . Gout   . Hx of bladder cancer   . Deep venous thrombosis     hx of  LEFT ARM AFTER PACEMAKER INSERTION ABOUT 6 YRS AGO  . Osteoporosis   . Renal failure   . Heart murmur   . Depression   . Chronic kidney disease   . Kidney stone   . Prostate cancer     AND BLADDER CANCER - S/P URETEROILEAL CONDUIT  . Bradycardia     HX OF SYMPTOMATIC BRADYCARDIA - STATUS POST PERMANENT PACEMAKER  . S/P ileal conduit     HX BLADDER AND PROSTATE CANCER  . Macular degeneration     PT STATES HIS VISION STILL OKAY FOR DRIVING  . 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 GREGG TAYLOR -ELECTROPHYSIOLOGIST  . Hydronephrosis with obstructing calculus     RIGHT SIDE - HOSPITALIZED 07-14-13 OT 07-17-13   Past Surgical History  Procedure Laterality Date  . Insert / replace / remove pacemaker    . Cystectomy    . Lymphadenectomy    . Prostate surgery    . Appendectomy    . Cataract extraction, bilateral     . Kidney stone surgery    . Ilial conduit      for bladder cancer  . Nephrolithotomy  09/02/2011    Procedure: NEPHROLITHOTOMY PERCUTANEOUS;  Surgeon: Malka So, MD;  Location: [facility];  Service: Urology;  Laterality: Left;  . 07/20/13  conversion of right sided nephrostomy catheter to right sided nephro ureteral catheter - done in interventional radiology    . Nephrolithotomy Right 07/27/2013    Procedure: RIGHT PERCUTANEOUS NEPHROLITHOTOMY ;  Surgeon: Irine Seal, MD;  Location: [facility];  Service: Urology;  Laterality: Right;   Family History  Problem Relation Age of Onset  . Prostate cancer Father 1    died  . Pancreatic cancer Mother 40    died   History  Substance Use Topics  . Smoking status: Former Smoker    Types: Cigarettes    Quit date: 03/25/1952  . Smokeless tobacco: Never Used  . Alcohol Use: No    Review of Systems  Genitourinary: Positive for flank pain.  All other systems reviewed and are negative.     Allergies  Review of patient's allergies indicates no known allergies.  Home Medications   Prior to Admission medications   Medication Sig Start Date End Date Taking? Authorizing Provider  amLODipine (NORVASC) 5 MG tablet Take  5 mg by mouth daily.   Yes Historical Provider, MD  aspirin 81 MG tablet Take 1 tablet (81 mg total) by mouth daily. 09/08/11  Yes Festus Aloe, MD  docusate sodium (COLACE) 100 MG capsule Take 100 mg by mouth daily as needed for mild constipation.    Yes Historical Provider, MD  Multiple Vitamins-Minerals (PRESERVISION AREDS 2 PO) Take 1 tablet by mouth 2 (two) times daily.   Yes Historical Provider, MD  vitamin C (ASCORBIC ACID) 500 MG tablet Take 500 mg by mouth daily.   Yes Historical Provider, MD  vitamin E 400 UNIT capsule Take 400 Units by mouth daily.   Yes Historical Provider, MD   BP 151/66  Pulse 75  Temp(Src) 98.3 F (36.8 C) (Oral)  Resp 18  SpO2 96%  Physical Exam  Nursing note and vitals  reviewed. Constitutional: He is oriented to person, place, and time. He appears well-developed and well-nourished.  HENT:  Head: Normocephalic and atraumatic.  Mouth/Throat: Oropharynx is clear and moist.  Eyes: Conjunctivae and EOM are normal. Pupils are equal, round, and reactive to light.  Neck: Normal range of motion.  Cardiovascular: Normal rate, regular rhythm and normal heart sounds.   Pulmonary/Chest: Effort normal and breath sounds normal.  Abdominal: Soft. Bowel sounds are normal.  Ileoconduit in place, stoma appears clean without signs of infection, yellow urine present in the collection bag with sediment noted, no gross hematuria  Musculoskeletal: Normal range of motion.  Neurological: He is alert and oriented to person, place, and time.  Skin: Skin is warm and dry.  Psychiatric: He has a normal mood and affect.    ED Course  Procedures (including critical care time) Labs Review Labs Reviewed  CBC WITH DIFFERENTIAL - Abnormal; Notable for the following:    WBC 12.3 (*)    RBC 3.47 (*)    Hemoglobin 11.2 (*)    HCT 33.2 (*)    Neutro Abs 8.9 (*)    Monocytes Absolute 1.3 (*)    All other components within normal limits  BASIC METABOLIC PANEL - Abnormal; Notable for the following:    Glucose, Bld 112 (*)    BUN 48 (*)    Creatinine, Ser 2.76 (*)    GFR calc non Af Amer 19 (*)    GFR calc Af Amer 22 (*)    All other components within normal limits  URINALYSIS, ROUTINE W REFLEX MICROSCOPIC - Abnormal; Notable for the following:    APPearance TURBID (*)    pH 8.5 (*)    Hgb urine dipstick MODERATE (*)    Protein, ur 100 (*)    Nitrite POSITIVE (*)    Leukocytes, UA LARGE (*)    All other components within normal limits  URINE MICROSCOPIC-ADD ON - Abnormal; Notable for the following:    Bacteria, UA MANY (*)    Crystals TRIPLE PHOSPHATE CRYSTALS (*)    All other components within normal limits    Imaging Review No results found.   EKG Interpretation None       MDM   Final diagnoses:  UTI (lower urinary tract infection)  S/P ileal conduit   80 year old male with ileal conduit secondary to prostate cancer, bladder cancer, enlarged kidney stones, presenting to the ED with right flank pain yesterday that has since resolved. On exam, he is afebrile there are nontoxic-appearing. He has no CVA tenderness, abdominal exam is benign. Ostomy appears clean without signs of infection.  Will obtain basic labs, u/a.  Labs with  mild leukocytosis of 12.3, renal function appears baseline for him when compared with previous.  U/a nitrite +, culture pending.  Patient given 1g IV Rocephin in ED.  Case discussed with on call urology, Dr. Glo Herring-- agrees with plan thus far, consider renal u/s if current pain, otherwise fine to go ahead with tube change tomorrow.  Abx infused, pt re-checked and still without current complaints, drinking juice in room.  Pt will be discharged home with keflex.  Will FU with IR tomorrow as previously scheduled.  Discussed plan with patient, he/she acknowledged understanding and agreed with plan of care.  Return precautions given for new or worsening symptoms.  Case discussed with attending physician, Dr. Dina Rich, who personally evaluated patient and agrees with assessment and plan of care.  Larene Pickett, PA-C 10/28/13 [place], PA-C 10/28/13 [phone]

## 2013-10-28 NOTE — Discharge Instructions (Signed)
Take the prescribed medication as directed. Go ahead with tube change tomorrow. Follow-up with Dr. Jeffie Pollock. Return to the ED for new or worsening symptoms.

## 2013-10-28 NOTE — ED Notes (Signed)
Pt with Ileostomy bag reports not feeling well today with right flank pain and back pain. Denies n/v/d. Pt due to have Procedure tomorrow to change "the tube in his back" in IR. Pt in NAD.

## 2013-10-28 NOTE — ED Provider Notes (Signed)
Medical screening examination/treatment/procedure(s) were conducted as a shared visit with non-physician practitioner(s) and myself.  I personally evaluated the patient during the encounter.   EKG Interpretation None      Patient presents with right-sided flank pain. History of ileal conduit. Currently pain free. Denies fevers at home. Exam is reassuring. Nitrite-positive urine. We'll send for culture. Mild leukocytosis. Discussed with urology. No overt signs of sepsis. Patient was given IV antibiotics. He has followup tomorrow for change of his conduit.  Urology to followup closely as an outpatient.  After history, exam, and medical workup I feel the patient has been appropriately medically screened and is safe for discharge home. Pertinent diagnoses were discussed with the patient. Patient was given return precautions.   Merryl Hacker, MD 10/28/13 [phone]

## 2013-10-29 ENCOUNTER — Encounter: Payer: Self-pay | Admitting: *Deleted

## 2013-10-29 ENCOUNTER — Other Ambulatory Visit: Payer: Self-pay | Admitting: Urology

## 2013-10-29 ENCOUNTER — Ambulatory Visit (HOSPITAL_COMMUNITY)
Admission: RE | Admit: 2013-10-29 | Discharge: 2013-10-29 | Disposition: A | Discharge status: Home / Self Care | Payer: Medicare Other | Source: Ambulatory Visit | Attending: Urology | Admitting: Urology

## 2013-10-29 DIAGNOSIS — Z436 Encounter for attention to other artificial openings of urinary tract: Secondary | ICD-10-CM | POA: Diagnosis not present

## 2013-10-29 DIAGNOSIS — N135 Crossing vessel and stricture of ureter without hydronephrosis: Secondary | ICD-10-CM

## 2013-10-29 LAB — URINE CULTURE: Colony Count: >=100000

## 2013-10-29 MED ORDER — IOHEXOL 300 MG/ML  SOLN
10.0000 mL | Freq: Once | INTRAMUSCULAR | Status: AC | PRN
  Administered 2013-10-29: 15 mL

## 2013-12-02 ENCOUNTER — Ambulatory Visit (HOSPITAL_COMMUNITY)
Admission: RE | Admit: 2013-12-02 | Discharge: 2013-12-02 | Disposition: A | Discharge status: Home / Self Care | Payer: Medicare Other | Source: Ambulatory Visit | Attending: Urology | Admitting: Urology

## 2013-12-02 ENCOUNTER — Other Ambulatory Visit: Payer: Self-pay | Admitting: Urology

## 2013-12-02 DIAGNOSIS — N135 Crossing vessel and stricture of ureter without hydronephrosis: Secondary | ICD-10-CM | POA: Insufficient documentation

## 2013-12-02 MED ORDER — IOHEXOL 300 MG/ML  SOLN
10.0000 mL | Freq: Once | INTRAMUSCULAR | Status: AC | PRN
  Administered 2013-12-02: 10 mL

## 2013-12-02 NOTE — Procedures (Signed)
Exchange R nephroureteral cath under fluoro No complication No blood loss. See complete dictation in [facility].

## 2014-01-06 ENCOUNTER — Ambulatory Visit (INDEPENDENT_AMBULATORY_CARE_PROVIDER_SITE_OTHER): Payer: Medicare Other | Admitting: *Deleted

## 2014-01-06 ENCOUNTER — Other Ambulatory Visit: Payer: Self-pay | Admitting: Urology

## 2014-01-06 ENCOUNTER — Other Ambulatory Visit: Payer: Self-pay | Admitting: Radiology

## 2014-01-06 ENCOUNTER — Ambulatory Visit (HOSPITAL_COMMUNITY)
Admission: RE | Admit: 2014-01-06 | Discharge: 2014-01-06 | Disposition: A | Discharge status: Home / Self Care | Payer: Medicare Other | Source: Ambulatory Visit | Attending: Urology | Admitting: Urology

## 2014-01-06 DIAGNOSIS — Z436 Encounter for attention to other artificial openings of urinary tract: Secondary | ICD-10-CM | POA: Diagnosis not present

## 2014-01-06 DIAGNOSIS — Z932 Ileostomy status: Secondary | ICD-10-CM | POA: Insufficient documentation

## 2014-01-06 DIAGNOSIS — N135 Crossing vessel and stricture of ureter without hydronephrosis: Secondary | ICD-10-CM

## 2014-01-06 DIAGNOSIS — Z8551 Personal history of malignant neoplasm of bladder: Secondary | ICD-10-CM | POA: Diagnosis not present

## 2014-01-06 DIAGNOSIS — Z906 Acquired absence of other parts of urinary tract: Secondary | ICD-10-CM | POA: Diagnosis not present

## 2014-01-06 DIAGNOSIS — R001 Bradycardia, unspecified: Secondary | ICD-10-CM

## 2014-01-06 LAB — MDC_IDC_ENUM_SESS_TYPE_INCLINIC
Battery Impedance: 545 Ohm
Battery Voltage: 2.79 V
Brady Statistic AP VP Percent: 7 %
Brady Statistic AP VS Percent: 92 %
Brady Statistic AS VP Percent: 0 %
Brady Statistic AS VS Percent: 1 %
Date Time Interrogation Session: 20151015192406
Lead Channel Impedance Value: 479 Ohm
Lead Channel Impedance Value: 629 Ohm
Lead Channel Pacing Threshold Amplitude: 0.5 V
Lead Channel Pacing Threshold Pulse Width: 0.4 ms
Lead Channel Setting Pacing Amplitude: 2.5 V
Lead Channel Setting Pacing Pulse Width: 0.4 ms
MDC IDC MSMT BATTERY REMAINING LONGEVITY: 81 mo
MDC IDC MSMT LEADCHNL RV PACING THRESHOLD AMPLITUDE: 0.5 V
MDC IDC MSMT LEADCHNL RV PACING THRESHOLD PULSEWIDTH: 0.4 ms
MDC IDC MSMT LEADCHNL RV SENSING INTR AMPL: 5.6 mV
MDC IDC SET LEADCHNL RA PACING AMPLITUDE: 2 V
MDC IDC SET LEADCHNL RV SENSING SENSITIVITY: 2 mV

## 2014-01-06 MED ORDER — CIPROFLOXACIN HCL 500 MG PO TABS
500.0000 mg | ORAL_TABLET | Freq: Two times a day (BID) | ORAL | Status: DC
  Administered 2014-01-06: 500 mg via ORAL
  Filled 2014-01-06: qty 1

## 2014-01-06 MED ORDER — IOHEXOL 300 MG/ML  SOLN
20.0000 mL | Freq: Once | INTRAMUSCULAR | Status: AC | PRN
  Administered 2014-01-06: 10 mL

## 2014-01-06 NOTE — Progress Notes (Signed)
Pacemaker check in clinic. Normal device function. Thresholds, sensing, impedances consistent with previous measurements. Device programmed to maximize longevity. 7 mode switches all < 1 minute, - coumadin.  No high ventricular rates noted. Device programmed at appropriate safety margins. Histogram distribution appropriate for patient activity level. Device programmed to optimize intrinsic conduction. Estimated longevity 6.5 years.  Patient education completed.  ROV 6 months with Dr. Lovena Le.

## 2014-01-07 ENCOUNTER — Other Ambulatory Visit: Payer: Self-pay | Admitting: Urology

## 2014-01-07 ENCOUNTER — Ambulatory Visit (HOSPITAL_COMMUNITY)
Admission: RE | Admit: 2014-01-07 | Discharge: 2014-01-07 | Disposition: A | Discharge status: Home / Self Care | Payer: Medicare Other | Source: Ambulatory Visit | Attending: Urology | Admitting: Urology

## 2014-01-07 ENCOUNTER — Encounter (HOSPITAL_COMMUNITY): Payer: Self-pay

## 2014-01-07 DIAGNOSIS — Z7982 Long term (current) use of aspirin: Secondary | ICD-10-CM | POA: Diagnosis not present

## 2014-01-07 DIAGNOSIS — Z906 Acquired absence of other parts of urinary tract: Secondary | ICD-10-CM | POA: Diagnosis not present

## 2014-01-07 DIAGNOSIS — C61 Malignant neoplasm of prostate: Secondary | ICD-10-CM | POA: Insufficient documentation

## 2014-01-07 DIAGNOSIS — N135 Crossing vessel and stricture of ureter without hydronephrosis: Secondary | ICD-10-CM

## 2014-01-07 DIAGNOSIS — Z86718 Personal history of other venous thrombosis and embolism: Secondary | ICD-10-CM | POA: Insufficient documentation

## 2014-01-07 DIAGNOSIS — I10 Essential (primary) hypertension: Secondary | ICD-10-CM | POA: Diagnosis not present

## 2014-01-07 DIAGNOSIS — Z87891 Personal history of nicotine dependence: Secondary | ICD-10-CM | POA: Diagnosis not present

## 2014-01-07 DIAGNOSIS — Z79899 Other long term (current) drug therapy: Secondary | ICD-10-CM | POA: Diagnosis not present

## 2014-01-07 DIAGNOSIS — C679 Malignant neoplasm of bladder, unspecified: Secondary | ICD-10-CM | POA: Insufficient documentation

## 2014-01-07 LAB — CBC WITH DIFFERENTIAL/PLATELET
Basophils Absolute: 0 10*3/uL (ref 0.0–0.1)
Basophils Relative: 0 % (ref 0–1)
EOS PCT: 8 % — AB (ref 0–5)
Eosinophils Absolute: 0.8 10*3/uL — ABNORMAL HIGH (ref 0.0–0.7)
HCT: 35.5 % — ABNORMAL LOW (ref 39.0–52.0)
Hemoglobin: 11.8 g/dL — ABNORMAL LOW (ref 13.0–17.0)
LYMPHS ABS: 2.6 10*3/uL (ref 0.7–4.0)
Lymphocytes Relative: 27 % (ref 12–46)
MCH: 30.7 pg (ref 26.0–34.0)
MCHC: 33.2 g/dL (ref 30.0–36.0)
MCV: 92.4 fL (ref 78.0–100.0)
Monocytes Absolute: 0.8 10*3/uL (ref 0.1–1.0)
Monocytes Relative: 8 % (ref 3–12)
Neutro Abs: 5.4 10*3/uL (ref 1.7–7.7)
Neutrophils Relative %: 57 % (ref 43–77)
Platelets: 313 10*3/uL (ref 150–400)
RBC: 3.84 MIL/uL — AB (ref 4.22–5.81)
RDW: 14.1 % (ref 11.5–15.5)
WBC: 9.6 10*3/uL (ref 4.0–10.5)

## 2014-01-07 LAB — BASIC METABOLIC PANEL
ANION GAP: 15 (ref 5–15)
BUN: 38 mg/dL — ABNORMAL HIGH (ref 6–23)
CO2: 24 meq/L (ref 19–32)
Calcium: 9.2 mg/dL (ref 8.4–10.5)
Chloride: 105 mEq/L (ref 96–112)
Creatinine, Ser: 2.43 mg/dL — ABNORMAL HIGH (ref 0.50–1.35)
GFR calc Af Amer: 25 mL/min — ABNORMAL LOW (ref >90)
GFR, EST NON AFRICAN AMERICAN: 22 mL/min — AB (ref >90)
GLUCOSE: 89 mg/dL (ref 70–99)
POTASSIUM: 5.1 meq/L (ref 3.7–5.3)
SODIUM: 144 meq/L (ref 137–147)

## 2014-01-07 LAB — PROTIME-INR
INR: 1.1 (ref 0.00–1.49)
PROTHROMBIN TIME: 14.3 s (ref 11.6–15.2)

## 2014-01-07 MED ORDER — MIDAZOLAM HCL 2 MG/2ML IJ SOLN
INTRAMUSCULAR | Status: AC
  Filled 2014-01-07: qty 4

## 2014-01-07 MED ORDER — CIPROFLOXACIN IN D5W 400 MG/200ML IV SOLN
INTRAVENOUS | Status: AC
  Filled 2014-01-07: qty 200

## 2014-01-07 MED ORDER — MIDAZOLAM HCL 2 MG/2ML IJ SOLN
INTRAMUSCULAR | Status: AC | PRN
  Administered 2014-01-07 (×2): 1 mg via INTRAVENOUS

## 2014-01-07 MED ORDER — FENTANYL CITRATE 0.05 MG/ML IJ SOLN
INTRAMUSCULAR | Status: AC
  Filled 2014-01-07: qty 4

## 2014-01-07 MED ORDER — IOHEXOL 300 MG/ML  SOLN: 20.0000 mL | Freq: Once | INTRAMUSCULAR | Status: AC | PRN

## 2014-01-07 MED ORDER — SODIUM CHLORIDE 0.9 % IV SOLN
INTRAVENOUS | Status: DC
  Administered 2014-01-07: 07:00:00 via INTRAVENOUS

## 2014-01-07 MED ORDER — CIPROFLOXACIN IN D5W 400 MG/200ML IV SOLN
400.0000 mg | Freq: Once | INTRAVENOUS | Status: AC
  Administered 2014-01-07: 400 mg via INTRAVENOUS

## 2014-01-07 MED ORDER — LIDOCAINE HCL 1 % IJ SOLN
INTRAMUSCULAR | Status: AC
  Filled 2014-01-07: qty 20

## 2014-01-07 MED ORDER — HYDROCODONE-ACETAMINOPHEN 5-325 MG PO TABS: 1.0000 | ORAL_TABLET | ORAL | Status: DC | PRN

## 2014-01-07 MED ORDER — FENTANYL CITRATE 0.05 MG/ML IJ SOLN
INTRAMUSCULAR | Status: AC | PRN
  Administered 2014-01-07 (×2): 50 ug via INTRAVENOUS

## 2014-01-07 NOTE — Discharge Instructions (Signed)
Conscious Sedation Sedation is the use of medicines to promote relaxation and relieve discomfort and anxiety. Conscious sedation is a type of sedation. Under conscious sedation you are less alert than normal but are still able to respond to instructions or stimulation. Conscious sedation is used during short medical and dental procedures. It is milder than deep sedation or general anesthesia and allows you to return to your regular activities sooner.  LET [facility] CARE PROVIDER KNOW ABOUT:   Any allergies you have.  All medicines you are taking, including vitamins, herbs, eye drops, creams, and over-the-counter medicines.  Use of steroids (by mouth or creams).  Previous problems you or members of your family have had with the use of anesthetics.  Any blood disorders you have.  Previous surgeries you have had.  Medical conditions you have.  Possibility of pregnancy, if this applies.  Use of cigarettes, alcohol, or illegal drugs. RISKS AND COMPLICATIONS Generally, this is a safe procedure. However, as with any procedure, problems can occur. Possible problems include:  Oversedation.  Trouble breathing on your own. You may need to have a breathing tube until you are awake and breathing on your own.  Allergic reaction to any of the medicines used for the procedure. BEFORE THE PROCEDURE  You may have blood tests done. These tests can help show how well your kidneys and liver are working. They can also show how well your blood clots.  A physical exam will be done.  Only take medicines as directed by your health care provider. You may need to stop taking medicines (such as blood thinners, aspirin, or nonsteroidal anti-inflammatory drugs) before the procedure.   Do not eat or drink at least 6 hours before the procedure or as directed by your health care provider.  Arrange for a responsible adult, family member, or friend to take you home after the procedure. He or she should stay  with you for at least 24 hours after the procedure, until the medicine has worn off. PROCEDURE   An intravenous (IV) catheter will be inserted into one of your veins. Medicine will be able to flow directly into your body through this catheter. You may be given medicine through this tube to help prevent pain and help you relax.  The medical or dental procedure will be done. AFTER THE PROCEDURE  You will stay in a recovery area until the medicine has worn off. Your blood pressure and pulse will be checked.   Depending on the procedure you had, you may be allowed to go home when you can tolerate liquids and your pain is under control. Document Released: 12/04/2000 Document Revised: 03/16/2013 Document Reviewed: 11/16/2012 [facility] Patient Information 2015 [place], [place]. This information is not intended to replace advice given to you by your health care provider. Make sure you discuss any questions you have with your health care provider. Percutaneous Nephrostomy Home Guide A nephrostomy tube allows urine to leave your body when a medical condition prevents it from leaving your kidney normally. Urine is normally carried from the kidneys to the bladder through narrow tubes called ureters. The ureter can become obstructed due to conditions such as kidney stones, tumors, infection, or blood clots. A nephrostomy tube is a hollow, flexible tube placed into the kidney to restore the flow of urine. The tube is placed on the right or left side of your lower back and is connected to an external drainage bag. PERSONAL HYGIENE  You may shower unless otherwise told by your health care  provider. Prepare for a shower by placing a plastic covering over the nephrostomy tube dressing.  Change the dressing immediately after showering. Make sure your skin around the nephrostomy tube exit site is dry.  Avoid immersing your nephrostomy tube in water, such as taking a bath or swimming. NEPHROSTOMY TUBE CARE  Your  nephrostomy tube is connected to a leg bag or bedside drainage bag. Always keep your tubing, leg bag, or bedside drainage bag below the level of your kidney so that your urine drains freely.  Your activity is not restricted as long as your activity does not pull or tug on your tube.  During the day, if you are connecting your nephrostomy tube to a leg bag, ensure that your tubing does not have any kinks and that your urine is draining freely. One helpful technique to prevent kinking or inadvertent dislodging of your nephrostomy tubing is to gently wrap an elastic bandage over the tubing. This will help to secure the tubing in place. Make sure there is no tension on the tubing so it does not become dislodged.  At night, you may want to connect your nephrostomy tube to a larger bedside drainage bag. EMPTYING THE LEG BAG OR BEDSIDE DRAINAGE BAG  The leg bag or bedside drainage bag should be emptied when it becomes  full and before going to sleep. Most leg bags and bedside drainage bags have a drain at the bottom that allows urine to be emptied. 1. Hold the leg bag or bedside drainage bag over a toilet or collection container. Use a measuring container if you are directed to measure your urine. 2. Open the drain and allow the urine to drain. 3. Once all the urine is drained from the leg bag or bedside drainage bag, close the drain fully to avoid urine leakage. 4. Flush urine down toilet. If a collection container was used, rinse the container. NEPHROSTOMY TUBE EXIT SITE CARE The exit site for your nephrostomy tube is covered with a bandage (dressing). Clean your exit site and change your dressing as directed by your health care provider or if your dressing becomes wet. Supplies Needed:  Mild soap and water.  44 inch (10x10 cm) split gauze pads.  44 inch (10x10 cm) gauze pads.  Paper tape. Exit Site Care and Dressing Change:For the first 2 weeks after having a nephrostomy tube inserted, you  should change the dressing every day. After 2 weeks, you can change the dressing 2 times per week, whenever the dressing becomes wet, or as told by your health care provider. Because of the location of your nephrostomy tube, you may need help from another person to complete dressing changes. The steps to changing a dressing are: 1. Wash hands well with soap and water. 2. Gently remove the tapes and dressing from around the nephrostomy tube. Be careful not to pull on the tube while removing the dressing. Avoid using scissors to remove the dressing since this may lead to accidental damage to the tube. 3. Wash the skin around the tube with the mild soap and water, rinse well, and dry with a clean cloth. 4. Inspect the skin around the drain for redness, swelling, and foul-smelling yellow or green discharge. 5. If the drain was sutured to the skin, inspect the suture to verify that it is still anchored in the skin. 6. Place two split gauze pads in and around the tube exit site. Do not apply ointments or alcohol to the site. 7. Place a gauze pad on  top of the split gauze pad. 8. Coil the tube on top of the gauze. The tubing should rest on the gauze, not on the skin. 9. Place tape around each edge of the gauze pad. 10. Secure the nephrostomy tubing. Ensure that the nephrostomy tube does not kink or become pinched. The tubing should rest on the gauze pad and not on the skin. 11. Dispose of used supplies properly. FLUSHING YOUR NEPHROSTOMY TUBE  Flush your nephrostomy tube as directed by your health care provider. Flushing of a nephrostomy tube is easier if a three-way stopcock is placed between the tube and the leg bag or bedside drainage bag. One connection of the three-way stopcock connects to your tube, the second connects to the leg bag or bedside drainage bag, and the third connection is usually covered with a cap. The three-way stopcock lever points to the direction on the stopcock that is closed to flow.  Normally, the lever points in the direction of the cap to allow urine to drain from the tube to the leg bag or bedside drainage bag. Supplies Needed:  Rubbing alcohol wipe.  10 mL 0.9% saline syringe. Flushing Your Nephrostomy Tube 1. Gather needed supplies. 2. Move the lever of the three-way stopcock so that it points toward the leg bag or bedside drainage bag. 3. Clean the cap with a rubbing alcohol wipe and then screw the tip of a 10 mL 0.9% saline syringe onto the cap. 4. Using the syringe plunger, slowly push the 10 mL 0.9% saline in the syringe over 5-10 seconds. If resistance is met or pain occurs while pushing, stop pushing the saline immediately. 5. Remove the syringe from the cap. 6. Return the stopcock lever to the usual position which is pointing in the direction of the cap. 7. Dispose of used supplies properly. REPLACING YOUR LEG BAG OR BEDSIDE DRAINAGE BAG  Replace your leg bag or bedside drainage bag, three-way stopcock, and any extension tubing as directed by your health care provider. Make sure you always have an extra drainage bag and connecting tubing available. 1. Empty urine from your leg bag or bedside drainage bag. 2. Gather new leg bag or bedside drainage bag, three-way stopcock, and any extension tubing. 3. Remove the leg bag or bedside drainage bag, three-way stopcock, and any extension tubing from the nephrostomy tube. 4. Attach the new leg bag or bedside drainage bag, three-way stopcock, and any extension tubing to the nephrostomy tube. 5. Dispose of the used leg bag or bedside drainage bag, three-way stopcock, and any extension tubing from the nephrostomy tube. SEEK IMMEDIATE MEDICAL CARE IF:  You have a fever.  You have abdominal pain during the first week after nephrostomy tube insertion.  You have new appearance of blood in your urine.  You have drainage, redness, swelling or pain at your nephrostomy tube insertion site.  You have difficulty or pain with  flushing the tube.  You notice a decrease in your urine output not explained by drinking less fluids.  Your nephrostomy tube comes out or the suture securing the tube comes free. Document Released: 12/30/2012 Document Revised: 03/16/2013 Document Reviewed: 12/30/2012 [facility] Patient Information 2015 [place], [place]. This information is not intended to replace advice given to you by your health care provider. Make sure you discuss any questions you have with your health care provider.

## 2014-01-07 NOTE — Procedures (Signed)
R perc neph for retrograde nephroureteral [id number] No complication No blood loss. See complete dictation in [facility].

## 2014-01-07 NOTE — H&P (Signed)
Chief Complaint: "I am here for my right kidney tube placement."  Referring Physician(s): Wrenn,John J  History of Present Illness: [name] [name] is a 80 y.o. male with bladder cancer and prostate cancer s/p cystectomy/ileal conduit and with right ureteral stricture at anastomosis. Patient with previous right nephroureteral catheter placed 07/20/13 s/p unsuccessful exchange 10/15 with loss of access. He has been scheduled today for new image guided percutaneous nephroureteral catheter placement. He denies any chest pain, shortness of breath or palpitations. He denies any active signs of bleeding or excessive bruising. He denies any recent fever or chills. The patient denies any history of sleep apnea or chronic oxygen use. He has previously tolerated sedation without complications.   Past Medical History  Diagnosis Date  . Hypertension   . Aortic valve stenosis   . Gout   . Hx of bladder cancer   . Deep venous thrombosis     hx of  LEFT ARM AFTER PACEMAKER INSERTION ABOUT 6 YRS AGO  . Osteoporosis   . Renal failure   . Heart murmur   . Depression   . Chronic kidney disease   . Kidney stone   . Prostate cancer     AND BLADDER CANCER - S/P URETEROILEAL CONDUIT  . Bradycardia     HX OF SYMPTOMATIC BRADYCARDIA - STATUS POST PERMANENT PACEMAKER  . S/P ileal conduit     HX BLADDER AND PROSTATE CANCER  . Macular degeneration     PT STATES HIS VISION STILL OKAY FOR DRIVING  . 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 GREGG TAYLOR -ELECTROPHYSIOLOGIST  . Hydronephrosis with obstructing calculus     RIGHT SIDE - HOSPITALIZED 07-14-13 OT 07-17-13    Past Surgical History  Procedure Laterality Date  . Insert / replace / remove pacemaker    . Cystectomy    . Lymphadenectomy    . Prostate surgery    . Appendectomy    . Cataract extraction, bilateral    . Kidney stone surgery    . Ilial conduit      for bladder cancer  . Nephrolithotomy  09/02/2011    Procedure:  NEPHROLITHOTOMY PERCUTANEOUS;  Surgeon: Malka So, MD;  Location: [facility];  Service: Urology;  Laterality: Left;  . 07/20/13  conversion of right sided nephrostomy catheter to right sided nephro ureteral catheter - done in interventional radiology    . Nephrolithotomy Right 07/27/2013    Procedure: RIGHT PERCUTANEOUS NEPHROLITHOTOMY ;  Surgeon: Irine Seal, MD;  Location: [facility];  Service: Urology;  Laterality: Right;    Allergies: Review of patient's allergies indicates no known allergies.  Medications: Prior to Admission medications   Medication Sig Start Date End Date Taking? Authorizing Provider  acetaminophen (TYLENOL) 500 MG tablet Take 1,000 mg by mouth every 8 (eight) hours as needed for mild pain or headache.   Yes Historical Provider, MD  amLODipine (NORVASC) 5 MG tablet Take 5 mg by mouth every morning.    Yes Historical Provider, MD  aspirin EC 81 MG tablet Take 81 mg by mouth every morning.   Yes Historical Provider, MD  Cholecalciferol (VITAMIN D-3) 1000 UNITS CAPS Take 1 capsule by mouth every morning.   Yes Historical Provider, MD  docusate sodium (COLACE) 100 MG capsule Take 100 mg by mouth daily as needed for mild constipation.    Yes Historical Provider, MD  Hyprom-Naphaz-Polysorb-Zn Sulf (CLEAR EYES COMPLETE OP) Apply 1-2 drops to eye daily as needed (dry eyes).   Yes  Historical Provider, MD  Multiple Vitamins-Minerals (PRESERVISION AREDS 2 PO) Take 1 tablet by mouth 2 (two) times daily.   Yes Historical Provider, MD  OMEGA-3 KRILL OIL PO Take 1 tablet by mouth every morning.   Yes Historical Provider, MD    Family History  Problem Relation Age of Onset  . Prostate cancer Father 39    died  . Pancreatic cancer Mother 72    died    History   Social History  . Marital Status: Widowed    Spouse Name: N/A    Number of Children: 3  . Years of Education: N/A   Occupational History  .     Social History Main Topics  . Smoking status: Former Smoker    Types:  Cigarettes    Quit date: 03/25/1952  . Smokeless tobacco: Never Used  . Alcohol Use: No  . Drug Use: No  . Sexual Activity: No   Other Topics Concern  . None   Social History Narrative  . None    Review of Systems: A 12 point ROS discussed and pertinent positives are indicated in the HPI above.  All other systems are negative.  Review of Systems  Vital Signs: BP 158/67  Pulse 88  Temp(Src) 98.1 F (36.7 C) (Oral)  Resp 18  Ht 5' 8.5" (1.74 m)  Wt 178 lb (80.74 kg)  BMI 26.67 kg/m2  SpO2 100%  Physical Exam  Constitutional: He is oriented to person, place, and time. He appears well-developed and well-nourished. No distress.  HENT:  Head: Normocephalic and atraumatic.  Neck: No tracheal deviation present.  Cardiovascular: Normal rate and regular rhythm.  Exam reveals no gallop and no friction rub.   No murmur heard. Pulmonary/Chest: Effort normal and breath sounds normal. No respiratory distress. He has no wheezes. He has no rales.  Abdominal: Soft. Bowel sounds are normal. He exhibits no distension. There is no tenderness.  Ileal conduit intact  Neurological: He is alert and oriented to person, place, and time.  Skin: Skin is warm and dry. He is not diaphoretic.  Psychiatric: He has a normal mood and affect. His behavior is normal. Thought content normal.    Imaging: Ir Nephrostogram Right  01/06/2014   CLINICAL DATA:  80 year old male with history of bladder cancer status post cystectomy and bilateral ureteral diversion via right lower quadrant diverting ileostomy. History of right distal ureteral stricture at the ureteral enteric anastomosis. Patient currently has a retrograde nephro ureteral tube via the right lower quadrant ileostomy.  EXAM: RIGHT NEPHROSTOGRAM  Date: 01/06/2014  PROCEDURE: 1. Antegrade nephrostogram 2. Failed exchange of nephroureteral tube Interventional Radiologist:  Criselda Peaches, MD  ANESTHESIA/SEDATION: None required.  FLUOROSCOPY TIME:   13 minutes 18 seconds  CONTRAST:  50 mL Omnipaque 300  TECHNIQUE: Informed consent was obtained from the patient following explanation of the procedure, risks, benefits and alternatives. The patient understands, agrees and consents for the procedure. All questions were addressed. A time out was performed.  Maximal barrier sterile technique utilized including caps, mask, sterile gowns, sterile gloves, large sterile drape, hand hygiene, and Betadine skin prep.  A gentle injection of contrast material through the existing tube confirmed its location within the renal pelvis. Of note, the tube is very difficult to inject. The tube was transected and an attempt was made to pass a an Amplatz wire through the tube. However, the inner portion of the tube is encrusted. The wire could not be advanced through the tube.  Therefore, a  stiff glidewire was used to navigate through the tube. The tube was then removed over the glidewire. Unfortunately, due to 0 fraction within the tube and a tortuous course of the diverting ileostomy, the stiff glidewire back out of the renal collecting system and into the ileal loop during this maneuver. Access was lost to the renal collecting system. A copy catheter was introduced over a glidewire and attempts were made to Re cannulate the right ureteral orifice. This was unsuccessful. Contrast material was then injected into the ileal loop pressure arising the system resulting in reflux into the patent left ureter and renal collecting system. Unfortunately, the origin of the stenosed right ureter could not be identified.  Additional attempts were made with different catheters and wires to cannulate the ureteral orifice. Ultimately, this was unsuccessful.  COMPLICATIONS: SIR level C: Requires therapy, minor hospitalization (< 48 hrs).  Access to the right collecting system was lost during the procedure. This will necessitate a repeat right percutaneous nephrostomy tube placement and repeat  conversion to retrograde nephroureteral tube.  IMPRESSION: Unsuccessful attempted exchange of retrograde nephroureteral tube through the right lower quadrant diverting ileostomy. Unfortunately, the tube was partially encrusted and required a glidewire to traverse the tube. Access to the renal collecting system was lost during tube removal over the wire.  PLAN: 1. Patient will require repeat right percutaneous nephrostomy. Given the decompressed state of the kidney presently, this would be better performed in the morning after the kidney has developed some degree of hydronephrosis. I explained today's complication and the plan moving forward to both the patient and his son who understand. 2. The patient was treated with 500 mg ciprofloxacin orally today and was scheduled to return tomorrow morning to interventional Radiology. Signed,  Criselda Peaches, MD  [facility]  [facility]   Electronically Signed   By: Jacqulynn Cadet M.D.   On: 01/06/2014 16:56    Labs:  CBC:  Recent Labs  07/31/13 0528 08/01/13 0502 10/28/13 1942 01/07/14 0650  WBC 13.1* 10.5 12.3* 9.6  HGB 9.0* 9.0* 11.2* 11.8*  HCT 27.0* 27.2* 33.2* 35.5*  PLT 202 211 194 313    COAGS:  Recent Labs  07/14/13 1744 07/20/13 0746 01/07/14 0650  INR 1.08 0.88 1.10  APTT 32  --   --     BMP:  Recent Labs  08/02/13 0350 08/03/13 0420 10/28/13 1942 01/07/14 0650  NA 143 144 139 144  K 4.3 4.4 5.3 5.1  CL 111 110 103 105  CO2 22 24 25 24   GLUCOSE 98 105* 112* 89  BUN 30* 25* 48* 38*  CALCIUM 7.9* 8.3* 9.5 9.2  CREATININE 2.67* 2.23* 2.76* 2.43*  GFRNONAA 20* 24* 19* 22*  GFRAA 23* 28* 22* 25*    LIVER FUNCTION TESTS:  Recent Labs  07/14/13 1744 07/15/13 0353 07/29/13 1725 07/30/13 0455  BILITOT 0.3 0.3 0.2* 0.3  AST 17 15 34 25  ALT 8 8 16 12   ALKPHOS 63 56 85 80  PROT 7.4 7.3 6.3 6.1  ALBUMIN 3.4* 3.2* 2.4* 2.2*    TUMOR MARKERS: No results  found for this basename: AFPTM, CEA, CA199, CHROMGRNA,  in the last 8760 hours  Assessment and Plan: Bladder cancer/prostate cancer s/p cystectomy/ileal conduit with right ureteral stricture at anastomosis  Previous right nephroureteral catheter placed 07/20/13 S/p unsuccessful exchange 10/15 with loss of access Patient scheduled today for new image guided percutaneous nephroureteral catheter placement with moderate sedation Patient has been NPO, has  taken oral cipro 10/15 and will receive IV Cipro today, no blood thinners taken, labs reviewed Risks and Benefits discussed with the patient. All of the patient's questions were answered, patient is agreeable to proceed. Consent signed and in chart.     SignedHedy Jacob 01/07/2014, 8:13 AM

## 2014-01-13 ENCOUNTER — Other Ambulatory Visit: Payer: Self-pay | Admitting: Urology

## 2014-01-13 DIAGNOSIS — N135 Crossing vessel and stricture of ureter without hydronephrosis: Secondary | ICD-10-CM

## 2014-02-03 ENCOUNTER — Encounter: Payer: Self-pay | Admitting: Internal Medicine

## 2014-02-03 ENCOUNTER — Ambulatory Visit (HOSPITAL_COMMUNITY)
Admission: RE | Admit: 2014-02-03 | Discharge: 2014-02-03 | Disposition: A | Discharge status: Home / Self Care | Payer: Medicare Other | Source: Ambulatory Visit | Attending: Urology | Admitting: Urology

## 2014-02-03 ENCOUNTER — Other Ambulatory Visit: Payer: Self-pay | Admitting: Urology

## 2014-02-03 DIAGNOSIS — N135 Crossing vessel and stricture of ureter without hydronephrosis: Secondary | ICD-10-CM

## 2014-02-03 DIAGNOSIS — R509 Fever, unspecified: Secondary | ICD-10-CM | POA: Diagnosis not present

## 2014-02-03 DIAGNOSIS — A419 Sepsis, unspecified organism: Secondary | ICD-10-CM | POA: Diagnosis not present

## 2014-02-03 MED ORDER — IOHEXOL 300 MG/ML  SOLN
10.0000 mL | Freq: Once | INTRAMUSCULAR | Status: AC | PRN
  Administered 2014-02-03: 1 mL via INTRAVENOUS

## 2014-02-03 NOTE — Procedures (Signed)
Interventional Radiology Procedure Note  Procedure:  Exchange of right nephroureteral drain/catheter.  61F pigtail.   Findings: tortuous urostomy and anastamosis anatomy = challenging exchange.  Complications: No immediate Recommendations:  - Routine care - Lots of hydration to irrigate collecting system   Signed,  Dulcy Fanny. Earleen Newport, DO

## 2014-02-04 ENCOUNTER — Inpatient Hospital Stay (HOSPITAL_COMMUNITY)
Admission: EM | Admit: 2014-02-04 | Discharge: 2014-02-09 | DRG: 872 | Disposition: A | Discharge status: Home / Self Care | Payer: Medicare Other | Attending: Internal Medicine | Admitting: Internal Medicine

## 2014-02-04 ENCOUNTER — Emergency Department (HOSPITAL_COMMUNITY): Payer: Medicare Other

## 2014-02-04 ENCOUNTER — Encounter (HOSPITAL_COMMUNITY): Payer: Self-pay | Admitting: Nurse Practitioner

## 2014-02-04 DIAGNOSIS — N390 Urinary tract infection, site not specified: Secondary | ICD-10-CM | POA: Diagnosis not present

## 2014-02-04 DIAGNOSIS — Z9841 Cataract extraction status, right eye: Secondary | ICD-10-CM | POA: Diagnosis not present

## 2014-02-04 DIAGNOSIS — N184 Chronic kidney disease, stage 4 (severe): Secondary | ICD-10-CM | POA: Diagnosis not present

## 2014-02-04 DIAGNOSIS — Z906 Acquired absence of other parts of urinary tract: Secondary | ICD-10-CM | POA: Diagnosis present

## 2014-02-04 DIAGNOSIS — M109 Gout, unspecified: Secondary | ICD-10-CM | POA: Diagnosis present

## 2014-02-04 DIAGNOSIS — H3530 Unspecified macular degeneration: Secondary | ICD-10-CM | POA: Diagnosis present

## 2014-02-04 DIAGNOSIS — Z87891 Personal history of nicotine dependence: Secondary | ICD-10-CM | POA: Diagnosis not present

## 2014-02-04 DIAGNOSIS — M810 Age-related osteoporosis without current pathological fracture: Secondary | ICD-10-CM | POA: Diagnosis present

## 2014-02-04 DIAGNOSIS — Z7982 Long term (current) use of aspirin: Secondary | ICD-10-CM

## 2014-02-04 DIAGNOSIS — B951 Streptococcus, group B, as the cause of diseases classified elsewhere: Secondary | ICD-10-CM | POA: Diagnosis present

## 2014-02-04 DIAGNOSIS — Z9842 Cataract extraction status, left eye: Secondary | ICD-10-CM

## 2014-02-04 DIAGNOSIS — Z8546 Personal history of malignant neoplasm of prostate: Secondary | ICD-10-CM | POA: Diagnosis not present

## 2014-02-04 DIAGNOSIS — N189 Chronic kidney disease, unspecified: Secondary | ICD-10-CM | POA: Diagnosis present

## 2014-02-04 DIAGNOSIS — Z936 Other artificial openings of urinary tract status: Secondary | ICD-10-CM | POA: Diagnosis not present

## 2014-02-04 DIAGNOSIS — A419 Sepsis, unspecified organism: Principal | ICD-10-CM | POA: Diagnosis present

## 2014-02-04 DIAGNOSIS — Z950 Presence of cardiac pacemaker: Secondary | ICD-10-CM

## 2014-02-04 DIAGNOSIS — F329 Major depressive disorder, single episode, unspecified: Secondary | ICD-10-CM | POA: Diagnosis present

## 2014-02-04 DIAGNOSIS — R7881 Bacteremia: Secondary | ICD-10-CM

## 2014-02-04 DIAGNOSIS — I129 Hypertensive chronic kidney disease with stage 1 through stage 4 chronic kidney disease, or unspecified chronic kidney disease: Secondary | ICD-10-CM | POA: Diagnosis present

## 2014-02-04 DIAGNOSIS — Z87442 Personal history of urinary calculi: Secondary | ICD-10-CM | POA: Diagnosis not present

## 2014-02-04 DIAGNOSIS — N139 Obstructive and reflux uropathy, unspecified: Secondary | ICD-10-CM | POA: Diagnosis present

## 2014-02-04 DIAGNOSIS — I350 Nonrheumatic aortic (valve) stenosis: Secondary | ICD-10-CM | POA: Diagnosis present

## 2014-02-04 DIAGNOSIS — Z66 Do not resuscitate: Secondary | ICD-10-CM | POA: Diagnosis present

## 2014-02-04 DIAGNOSIS — I10 Essential (primary) hypertension: Secondary | ICD-10-CM | POA: Diagnosis present

## 2014-02-04 DIAGNOSIS — N200 Calculus of kidney: Secondary | ICD-10-CM

## 2014-02-04 DIAGNOSIS — Z8551 Personal history of malignant neoplasm of bladder: Secondary | ICD-10-CM | POA: Diagnosis not present

## 2014-02-04 DIAGNOSIS — T83511A Infection and inflammatory reaction due to indwelling urethral catheter, initial encounter: Secondary | ICD-10-CM

## 2014-02-04 DIAGNOSIS — R509 Fever, unspecified: Secondary | ICD-10-CM | POA: Diagnosis present

## 2014-02-04 DIAGNOSIS — IMO0001 Reserved for inherently not codable concepts without codable children: Secondary | ICD-10-CM | POA: Insufficient documentation

## 2014-02-04 DIAGNOSIS — R319 Hematuria, unspecified: Secondary | ICD-10-CM

## 2014-02-04 LAB — BASIC METABOLIC PANEL
Anion gap: 13 (ref 5–15)
BUN: 41 mg/dL — ABNORMAL HIGH (ref 6–23)
CO2: 24 meq/L (ref 19–32)
Calcium: 9.1 mg/dL (ref 8.4–10.5)
Chloride: 104 mEq/L (ref 96–112)
Creatinine, Ser: 2.41 mg/dL — ABNORMAL HIGH (ref 0.50–1.35)
GFR calc Af Amer: 26 mL/min — ABNORMAL LOW (ref >90)
GFR calc non Af Amer: 22 mL/min — ABNORMAL LOW (ref >90)
Glucose, Bld: 125 mg/dL — ABNORMAL HIGH (ref 70–99)
POTASSIUM: 5.1 meq/L (ref 3.7–5.3)
Sodium: 141 mEq/L (ref 137–147)

## 2014-02-04 LAB — CBC
HEMATOCRIT: 33 % — AB (ref 39.0–52.0)
Hemoglobin: 10.7 g/dL — ABNORMAL LOW (ref 13.0–17.0)
MCH: 30.1 pg (ref 26.0–34.0)
MCHC: 32.4 g/dL (ref 30.0–36.0)
MCV: 93 fL (ref 78.0–100.0)
Platelets: 161 10*3/uL (ref 150–400)
RBC: 3.55 MIL/uL — ABNORMAL LOW (ref 4.22–5.81)
RDW: 14.7 % (ref 11.5–15.5)
WBC: 17.2 10*3/uL — AB (ref 4.0–10.5)

## 2014-02-04 LAB — URINALYSIS, ROUTINE W REFLEX MICROSCOPIC
Bilirubin Urine: NEGATIVE
GLUCOSE, UA: NEGATIVE mg/dL
KETONES UR: NEGATIVE mg/dL
Nitrite: POSITIVE — AB
Protein, ur: 100 mg/dL — AB
Specific Gravity, Urine: 1.011 (ref 1.005–1.030)
UROBILINOGEN UA: 0.2 mg/dL (ref 0.0–1.0)
pH: 7.5 (ref 5.0–8.0)

## 2014-02-04 LAB — I-STAT TROPONIN, ED: Troponin i, poc: 0.01 ng/mL (ref 0.00–0.08)

## 2014-02-04 LAB — I-STAT CHEM 8, ED
BUN: 44 mg/dL — ABNORMAL HIGH (ref 6–23)
CHLORIDE: 105 meq/L (ref 96–112)
Calcium, Ion: 1.14 mmol/L (ref 1.13–1.30)
Creatinine, Ser: 2.5 mg/dL — ABNORMAL HIGH (ref 0.50–1.35)
GLUCOSE: 123 mg/dL — AB (ref 70–99)
HCT: 33 % — ABNORMAL LOW (ref 39.0–52.0)
Hemoglobin: 11.2 g/dL — ABNORMAL LOW (ref 13.0–17.0)
Potassium: 4.8 mEq/L (ref 3.7–5.3)
Sodium: 141 mEq/L (ref 137–147)
TCO2: 24 mmol/L (ref 0–100)

## 2014-02-04 LAB — URINE MICROSCOPIC-ADD ON

## 2014-02-04 LAB — I-STAT CG4 LACTIC ACID, ED: Lactic Acid, Venous: 1.86 mmol/L (ref 0.5–2.2)

## 2014-02-04 MED ORDER — ONDANSETRON HCL 4 MG/2ML IJ SOLN
4.0000 mg | Freq: Once | INTRAMUSCULAR | Status: AC
  Administered 2014-02-04: 4 mg via INTRAVENOUS
  Filled 2014-02-04: qty 2

## 2014-02-04 MED ORDER — SODIUM CHLORIDE 0.9 % IV BOLUS (SEPSIS)
1000.0000 mL | Freq: Once | INTRAVENOUS | Status: AC
  Administered 2014-02-04: 1000 mL via INTRAVENOUS

## 2014-02-04 MED ORDER — ACETAMINOPHEN 500 MG PO TABS
1000.0000 mg | ORAL_TABLET | Freq: Once | ORAL | Status: AC
  Administered 2014-02-04: 1000 mg via ORAL
  Filled 2014-02-04: qty 2

## 2014-02-04 MED ORDER — ASPIRIN 81 MG PO CHEW
324.0000 mg | CHEWABLE_TABLET | Freq: Once | ORAL | Status: AC
  Administered 2014-02-04: 324 mg via ORAL
  Filled 2014-02-04: qty 4

## 2014-02-04 MED ORDER — DEXTROSE 5 % IV SOLN
1.0000 g | Freq: Once | INTRAVENOUS | Status: AC
  Administered 2014-02-04: 1 g via INTRAVENOUS
  Filled 2014-02-04: qty 10

## 2014-02-04 NOTE — H&P (Signed)
PCP:  Jerlyn Ly, MD  Urology [facility]  Chief Complaint:  fever  HPI: [name] [name] is a 80 y.o. male   has a past medical history of Hypertension; Aortic valve stenosis; Gout; bladder cancer; Deep venous thrombosis; Osteoporosis; Renal failure; Heart murmur; Depression; Chronic kidney disease; Kidney stone; Prostate cancer; Bradycardia; S/P ileal conduit; Macular degeneration; Pacemaker; and Hydronephrosis with obstructing calculus.   Presented with  Patient is sp Right nephrostomy tube originally placed for uretral obstruction likely due to Kidney stone for over 6 months ago he have had urostomy tube replaced yesterday. today he developed fever and chills. Family brought him to ER and was found to have UA significant for nitrites and many bacteria. Patient was noted to be febrile up to 103. In ER patient showed evidence of sleep apnea with hypoxia which improved with 2 L nasal Canula. Post-=proceedure patient have had some hematouria that is now improving.   Hospitalist was called for admission for UTI, Sepsis  Review of Systems:    Pertinent positives include:   Fevers, chills,   Constitutional:  No weight loss, night sweats,fatigue, weight loss  HEENT:  No headaches, Difficulty swallowing,Tooth/dental problems,Sore throat,  No sneezing, itching, ear ache, nasal congestion, post nasal drip,  Cardio-vascular:  No chest pain, Orthopnea, PND, anasarca, dizziness, palpitations.no Bilateral lower extremity swelling  GI:  No heartburn, indigestion, abdominal pain, nausea, vomiting, diarrhea, change in bowel habits, loss of appetite, melena, blood in stool, hematemesis Resp:  no shortness of breath at rest. No dyspnea on exertion, No excess mucus, no productive cough, No non-productive cough, No coughing up of blood.No change in color of mucus.No wheezing. Skin:  no rash or lesions. No jaundice GU:  no dysuria, change in color of urine, no urgency or frequency. No straining to  urinate.  No flank pain.  Musculoskeletal:  No joint pain or no joint swelling. No decreased range of motion. No back pain.  Psych:  No change in mood or affect. No depression or anxiety. No memory loss.  Neuro: no localizing neurological complaints, no tingling, no weakness, no double vision, no gait abnormality, no slurred speech, no confusion  Otherwise ROS are negative except for above, 10 systems were reviewed  Past Medical History: Past Medical History  Diagnosis Date  . Hypertension   . Aortic valve stenosis   . Gout   . Hx of bladder cancer   . Deep venous thrombosis     hx of  LEFT ARM AFTER PACEMAKER INSERTION ABOUT 6 YRS AGO  . Osteoporosis   . Renal failure   . Heart murmur   . Depression   . Chronic kidney disease   . Kidney stone   . Prostate cancer     AND BLADDER CANCER - S/P URETEROILEAL CONDUIT  . Bradycardia     HX OF SYMPTOMATIC BRADYCARDIA - STATUS POST PERMANENT PACEMAKER  . S/P ileal conduit     HX BLADDER AND PROSTATE CANCER  . Macular degeneration     PT STATES HIS VISION STILL OKAY FOR DRIVING  . 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 GREGG TAYLOR -ELECTROPHYSIOLOGIST  . Hydronephrosis with obstructing calculus     RIGHT SIDE - HOSPITALIZED 07-14-13 OT 07-17-13   Past Surgical History  Procedure Laterality Date  . Insert / replace / remove pacemaker    . Cystectomy    . Lymphadenectomy    . Prostate surgery    . Appendectomy    . Cataract extraction, bilateral    .  Kidney stone surgery    . Ilial conduit      for bladder cancer  . Nephrolithotomy  09/02/2011    Procedure: NEPHROLITHOTOMY PERCUTANEOUS;  Surgeon: Malka So, MD;  Location: [facility];  Service: Urology;  Laterality: Left;  . 07/20/13  conversion of right sided nephrostomy catheter to right sided nephro ureteral catheter - done in interventional radiology    . Nephrolithotomy Right 07/27/2013    Procedure: RIGHT PERCUTANEOUS NEPHROLITHOTOMY ;  Surgeon: Irine Seal, MD;   Location: [facility];  Service: Urology;  Laterality: Right;     Medications: Prior to Admission medications   Medication Sig Start Date End Date Taking? Authorizing Provider  acetaminophen (TYLENOL) 500 MG tablet Take 1,000 mg by mouth every 8 (eight) hours as needed for mild pain or headache.   Yes Historical Provider, MD  amLODipine (NORVASC) 5 MG tablet Take 5 mg by mouth every morning.    Yes Historical Provider, MD  aspirin EC 81 MG tablet Take 81 mg by mouth every morning.   Yes Historical Provider, MD  Cholecalciferol (VITAMIN D-3) 1000 UNITS CAPS Take 1 capsule by mouth every morning.   Yes Historical Provider, MD  docusate sodium (COLACE) 100 MG capsule Take 100 mg by mouth daily.    Yes Historical Provider, MD  Hyprom-Naphaz-Polysorb-Zn Sulf (CLEAR EYES COMPLETE OP) Apply 1-2 drops to eye daily as needed (dry eyes).   Yes Historical Provider, MD  Multiple Vitamins-Minerals (PRESERVISION AREDS 2 PO) Take 1 tablet by mouth 2 (two) times daily.   Yes Historical Provider, MD  OMEGA-3 KRILL OIL PO Take 1 tablet by mouth every morning.    Historical Provider, MD    Allergies:  No Known Allergies  Social History:  Ambulatory   Independently  Lives at home alone,         reports that he quit smoking about 61 years ago. His smoking use included Cigarettes. He smoked 0.00 packs per day. He has never used smokeless tobacco. He reports that he does not drink alcohol or use illicit drugs.    Family History: family history includes Pancreatic cancer (age of onset: 41) in his mother; Prostate cancer (age of onset: 54) in his father.    Physical Exam: Patient Vitals for the past 24 hrs:  BP Temp Temp src Pulse Resp SpO2 Height Weight  02/04/14 2223 (!) 103/52 mmHg - - 81 21 92 % - -  02/04/14 2214 (!) 103/52 mmHg - - 61 21 90 % - -  02/04/14 2013 - (!) 103.5 F (39.7 C) Rectal - - - - -  02/04/14 1958 130/55 mmHg 101.2 F (38.4 C) - 75 24 97 % 5' 8.5" (1.74 m) 78.472 kg (173 lb)    02/04/14 1952 - - - - - 92 % - -    1. General:  in No Acute distress 2. Psychological: Alert and   Oriented 3. Head/ENT:     Dry Mucous Membranes                          Head Non traumatic, neck supple                           Poor Dentition 4. SKIN: normal   Skin turgor,  Skin clean Dry and intact no rash 5. Heart: Regular rate and rhythm no Murmur, Rub or gallop 6. Lungs:difficult to assess, patient is snoring 7. Abdomen: Soft,  non-tender, Non distended 8. Lower extremities: no clubbing, cyanosis, or edema 9. Neurologically Grossly intact, moving all 4 extremities equally 10. MSK: Normal range of motion  body mass index is 25.92 kg/(m^2).   Labs on Admission:   Results for orders placed or performed during the hospital encounter of 02/04/14 (from the past 24 hour(s))  Basic metabolic panel     Status: Abnormal   Collection Time: 02/04/14  9:16 PM  Result Value Ref Range   Sodium 141 137 - 147 mEq/L   Potassium 5.1 3.7 - 5.3 mEq/L   Chloride 104 96 - 112 mEq/L   CO2 24 19 - 32 mEq/L   Glucose, Bld 125 (H) 70 - 99 mg/dL   BUN 41 (H) 6 - 23 mg/dL   Creatinine, Ser 2.41 (H) 0.50 - 1.35 mg/dL   Calcium 9.1 8.4 - 10.5 mg/dL   GFR calc non Af Amer 22 (L) >90 mL/min   GFR calc Af Amer 26 (L) >90 mL/min   Anion gap 13 5 - 15  CBC     Status: Abnormal   Collection Time: 02/04/14  9:16 PM  Result Value Ref Range   WBC 17.2 (H) 4.0 - 10.5 K/uL   RBC 3.55 (L) 4.22 - 5.81 MIL/uL   Hemoglobin 10.7 (L) 13.0 - 17.0 g/dL   HCT 33.0 (L) 39.0 - 52.0 %   MCV 93.0 78.0 - 100.0 fL   MCH 30.1 26.0 - 34.0 pg   MCHC 32.4 30.0 - 36.0 g/dL   RDW 14.7 11.5 - 15.5 %   Platelets 161 150 - 400 K/uL  I-stat troponin, ED     Status: None   Collection Time: 02/04/14  9:24 PM  Result Value Ref Range   Troponin i, poc 0.01 0.00 - 0.08 ng/mL   Comment 3          I-stat Chem 8, ED     Status: Abnormal   Collection Time: 02/04/14  9:25 PM  Result Value Ref Range   Sodium 141 137 - 147 mEq/L    Potassium 4.8 3.7 - 5.3 mEq/L   Chloride 105 96 - 112 mEq/L   BUN 44 (H) 6 - 23 mg/dL   Creatinine, Ser 2.50 (H) 0.50 - 1.35 mg/dL   Glucose, Bld 123 (H) 70 - 99 mg/dL   Calcium, Ion 1.14 1.13 - 1.30 mmol/L   TCO2 24 0 - 100 mmol/L   Hemoglobin 11.2 (L) 13.0 - 17.0 g/dL   HCT 33.0 (L) 39.0 - 52.0 %  I-Stat CG4 Lactic Acid, ED     Status: None   Collection Time: 02/04/14  9:25 PM  Result Value Ref Range   Lactic Acid, Venous 1.86 0.5 - 2.2 mmol/L  Urinalysis, Routine w reflex microscopic     Status: Abnormal   Collection Time: 02/04/14  9:57 PM  Result Value Ref Range   Color, Urine YELLOW YELLOW   APPearance CLOUDY (A) CLEAR   Specific Gravity, Urine 1.011 1.005 - 1.030   pH 7.5 5.0 - 8.0   Glucose, UA NEGATIVE NEGATIVE mg/dL   Hgb urine dipstick LARGE (A) NEGATIVE   Bilirubin Urine NEGATIVE NEGATIVE   Ketones, ur NEGATIVE NEGATIVE mg/dL   Protein, ur 100 (A) NEGATIVE mg/dL   Urobilinogen, UA 0.2 0.0 - 1.0 mg/dL   Nitrite POSITIVE (A) NEGATIVE   Leukocytes, UA LARGE (A) NEGATIVE  Urine microscopic-add on     Status: Abnormal   Collection Time: 02/04/14  9:57 PM  Result Value  Ref Range   Squamous Epithelial / LPF RARE RARE   WBC, UA 11-20 <3 WBC/hpf   RBC / HPF 3-6 <3 RBC/hpf   Bacteria, UA MANY (A) RARE    UA evidence of UTI  No results found for: HGBA1C  Estimated Creatinine Clearance: 19.3 mL/min (by C-G formula based on Cr of 2.5).  BNP (last 3 results) No results for input(s): PROBNP in the last 8760 hours.  Other results:  I have pearsonaly reviewed this: ECG REPORT  Rate: 75  Rhythm: NSR ST&T Change: no ischemia   Filed Weights   02/04/14 1958  Weight: 78.472 kg (173 lb)    Cultures:    Component Value Date/Time   [facility] URINE, RANDOM 10/28/2013 2043   [place] NONE 10/28/2013 2043   CULT  10/28/2013 2043    Multiple bacterial morphotypes present, none predominant. Suggest appropriate recollection if clinically indicated. Performed at [place] 10/29/2013 FINAL 10/28/2013 2043     Radiological Exams on Admission: Ir Catheter Tube Change  02/03/2014   CLINICAL DATA:  80 year old gentleman with a history of bladder cancer. He has had cystectomy and has a urostomy. He presents for routine exchange of a right nephro ureteral tube.  A previous attempt at exchange through the urostomy failed, with loss of access. The result was required access via antegrade percutaneous nephrostomy.  EXAM: FLUOROSCOPIC GUIDED EXCHANGE OF RIGHT NEPHRO URETEROSTOMY TUBE VIA THE PATIENT'S UROSTOMY.  FLUOROSCOPY TIME:  7 min, 42 seconds  TECHNIQUE: The procedure risks, benefits, and alternatives were explained to the patient. Questions regarding the procedure were encouraged and answered. The patient understands and consents to the procedure.  Small amount of contrast confirmed position of the indwelling tube within the right collecting system.  A standard Glidewire was advanced through the indwelling tube after ligating the tube. The Glidewire was used to cannulate the encrusted tip of the indwelling tube. The Glidewire was successful, though there was some friction on removing the tube. A sterile clamp was used to maintain the Glidewire access on the pin-pull removal of the catheter.  Once the catheter was removed, a 4 French straight tip glide cath was advanced to the collecting system of the right kidney. The standard Glidewire was then removed.  An Amplatz wire was then advanced through the glide cath to the collecting system after gently looping the end of the wire for safety purposes. This also increase the length of stiff wire within the tract.  The glide cath was removed from the Amplatz wire, and the Amplatz wire was used as the rail for placement of the new 45 cm 12 French pigtail catheter.  The pigtail catheter was formed in the collecting system and contrast was used to confirm position.  No complications were encountered. Although no  significant blood loss was encountered, the patient did have some blood-tinged urine.  Patient tolerated the procedure well and remained hemodynamically stable throughout.  COMPLICATIONS: None  FINDINGS: Initial images demonstrate indwelling nephrostomy tube in the collecting system of the right kidney.  Images during the case demonstrate fluoroscopically guided exchange of 45 cm 12 French pigtail nephro ureterostomy.  The patient has tortuous surgical anatomy, with redundancy of the urostomy and a low implantation of the ureter.  IMPRESSION: Status post exchange of patient's indwelling right-sided nephro ureteral tube via the patient's urostomy.  The patient has significant tortuosity of his surgical anatomy, making the exchange difficult. Successful steps detailed above.  Signed,  Dulcy Fanny. Earleen Newport, DO  [facility]  [facility]   Electronically Signed   By: Corrie Mckusick D.O.   On: 02/03/2014 13:25   Dg Chest Port 1 View  02/04/2014   CLINICAL DATA:  c/o of near syncope pt states "got swimmy headed", lowered himself to the ground, EMS report he was mildly pale, family remark on slight slurred speech after the incidence, c/o of vomiting as well, of note patient had his Urostomy tune replaced yesterday, denies any problems with that. Pt denies any pain at this time, in NAD.  EXAM: PORTABLE CHEST - 1 VIEW  COMPARISON:  07/29/2013  FINDINGS: Pacer with leads at right atrium and right ventricle. No lead discontinuity. Midline trachea. Mild cardiomegaly. Mediastinal contours otherwise within normal limits. No pleural effusion or pneumothorax. No congestive failure. Clear lungs.  IMPRESSION: Mild cardiomegaly, without acute disease.   Electronically Signed   By: Abigail Miyamoto M.D.   On: 02/04/2014 20:39    Chart has been reviewed  Assessment/Plan  80 year old gentleman with history of obstructive uropathy status post right side nephrostomy tube, presents with urinary  infection and sepsis  Present on Admission:  . Sepsis - admit, administer IV fluids and IV antibiotics, obtaining procalcitonin and blood cultures . Hypertension - hold Norvasc given hypertension . Obstructive uropathy - urology consult in the morning . Infection of urinary tract - treatment Rocephin   Prophylaxis:  SCD hold Lovenox given recent hematuria , Protonix  CODE STATUS:   DNR/DNI as per family  Other plan as per orders.  I have spent a total of  55 min on this admission  Wilhemina Grall 02/04/2014, 11:50 PM  [facility]  Pager [phone]   after 2 AM please page floor coverage PA If 7AM-7PM, please contact the day team taking care of the patient  [email or web address]  Password [email or web address]

## 2014-02-04 NOTE — ED Notes (Signed)
Pt presented from home via EMS, c/o of near syncope pt states "got swimmy headed", lowered himself to the ground, EMS report he was mildly pale, family remark on slight slurred speech after the incidence, c/o of vomiting as well, of note patient had his Urostomy tune replaced yesterday, denies any problems with that. Pt denies any pain at this time, in NAD.

## 2014-02-04 NOTE — ED Provider Notes (Signed)
CSN: [id number]     Arrival date & time 02/04/14  1950 History   First MD Initiated Contact with Patient 02/04/14 2009     Chief Complaint  Patient presents with  . Near Syncope  . Nausea  . Emesis     (Consider location/radiation/quality/duration/timing/severity/associated sxs/prior Treatment) HPI   [name] [name] is a(n) 80 y.o. male who presents to the emergency department with acute onset vomiting and weakness. He is 2 days status post IR guided right sided nephroureteral drain change. He has a past medical history of malignant bladder cancer, aortic stenosis, chronic kidney disease, bradycardia, and hypertension. He is status post pacemaker placement. According to the patient and his son about 3 hours prior to arrival the patient's son found him lying on his bathroom floor. The patient states that he became suddenly nauseated and felt dizzy and had to lay down on the floor. He states that he vomited water several times. Which he had just had prior to the incident. He denies any chest pain, vertigo, unilateral weakness, headache, visual disturbances. He denies any fever, chills, myalgias or arthralgias. Urine in his nephrostomy bag has become more clear and yesterday was bloody after the change. Patient denies history of heart failure, peripheral edema, dyspnea.  Past Medical History  Diagnosis Date  . Hypertension   . Aortic valve stenosis   . Gout   . Hx of bladder cancer   . Deep venous thrombosis     hx of  LEFT ARM AFTER PACEMAKER INSERTION ABOUT 6 YRS AGO  . Osteoporosis   . Renal failure   . Heart murmur   . Depression   . Chronic kidney disease   . Kidney stone   . Prostate cancer     AND BLADDER CANCER - S/P URETEROILEAL CONDUIT  . Bradycardia     HX OF SYMPTOMATIC BRADYCARDIA - STATUS POST PERMANENT PACEMAKER  . S/P ileal conduit     HX BLADDER AND PROSTATE CANCER  . Macular degeneration     PT STATES HIS VISION STILL OKAY FOR DRIVING  . Pacemaker     BECAUSE  OF SYMPTOMATIC BRADYCARDIA-DR. GREGG TAYLOR -ELECTROPHYSIOLOGIST  . Hydronephrosis with obstructing calculus     RIGHT SIDE - HOSPITALIZED 07-14-13 OT 07-17-13   Past Surgical History  Procedure Laterality Date  . Insert / replace / remove pacemaker    . Cystectomy    . Lymphadenectomy    . Prostate surgery    . Appendectomy    . Cataract extraction, bilateral    . Kidney stone surgery    . Ilial conduit      for bladder cancer  . Nephrolithotomy  09/02/2011    Procedure: NEPHROLITHOTOMY PERCUTANEOUS;  Surgeon: Malka So, MD;  Location: [facility];  Service: Urology;  Laterality: Left;  . 07/20/13  conversion of right sided nephrostomy catheter to right sided nephro ureteral catheter - done in interventional radiology    . Nephrolithotomy Right 07/27/2013    Procedure: RIGHT PERCUTANEOUS NEPHROLITHOTOMY ;  Surgeon: Irine Seal, MD;  Location: [facility];  Service: Urology;  Laterality: Right;   Family History  Problem Relation Age of Onset  . Prostate cancer Father 19    died  . Pancreatic cancer Mother 39    died   History  Substance Use Topics  . Smoking status: Former Smoker    Types: Cigarettes    Quit date: 03/25/1952  . Smokeless tobacco: Never Used  . Alcohol Use: No    Review of  Systems  Ten systems reviewed and are negative for acute change, except as noted in the HPI.    Allergies  Review of patient's allergies indicates no known allergies.  Home Medications   Prior to Admission medications   Medication Sig Start Date End Date Taking? Authorizing Provider  acetaminophen (TYLENOL) 500 MG tablet Take 1,000 mg by mouth every 8 (eight) hours as needed for mild pain or headache.    Historical Provider, MD  amLODipine (NORVASC) 5 MG tablet Take 5 mg by mouth every morning.     Historical Provider, MD  aspirin EC 81 MG tablet Take 81 mg by mouth every morning.    Historical Provider, MD  Cholecalciferol (VITAMIN D-3) 1000 UNITS CAPS Take 1 capsule by mouth every morning.     Historical Provider, MD  docusate sodium (COLACE) 100 MG capsule Take 100 mg by mouth daily as needed for mild constipation.     Historical Provider, MD  Hyprom-Naphaz-Polysorb-Zn Sulf (CLEAR EYES COMPLETE OP) Apply 1-2 drops to eye daily as needed (dry eyes).    Historical Provider, MD  Multiple Vitamins-Minerals (PRESERVISION AREDS 2 PO) Take 1 tablet by mouth 2 (two) times daily.    Historical Provider, MD  OMEGA-3 KRILL OIL PO Take 1 tablet by mouth every morning.    Historical Provider, MD   BP 130/55 mmHg  Pulse 75  Temp(Src) 101.2 F (38.4 C)  Resp 24  Ht 5' 8.5" (1.74 m)  Wt 173 lb (78.472 kg)  BMI 25.92 kg/m2  SpO2 97% Physical Exam  Constitutional: He appears well-developed and well-nourished. No distress.  Heart appearing, warm to touch, flushed face.  HENT:  Head: Normocephalic and atraumatic.  Eyes: Conjunctivae are normal. No scleral icterus.  Neck: Normal range of motion. Neck supple.  Cardiovascular: Normal rate and regular rhythm.   Murmur (aortic stenosis) heard. Pulmonary/Chest: Effort normal and breath sounds normal. No respiratory distress.  Abdominal: Soft. There is no tenderness.  Musculoskeletal: He exhibits no edema.  Neurological: He is alert.  Skin: Skin is warm and dry. He is not diaphoretic.  Psychiatric: His behavior is normal.  Nursing note and vitals reviewed.   ED Course  Procedures (including critical care time) Labs Review Labs Reviewed - No data to display  Imaging Review Ir Catheter Tube Change  02/03/2014   CLINICAL DATA:  80 year old gentleman with a history of bladder cancer. He has had cystectomy and has a urostomy. He presents for routine exchange of a right nephro ureteral tube.  A previous attempt at exchange through the urostomy failed, with loss of access. The result was required access via antegrade percutaneous nephrostomy.  EXAM: FLUOROSCOPIC GUIDED EXCHANGE OF RIGHT NEPHRO URETEROSTOMY TUBE VIA THE PATIENT'S UROSTOMY.   FLUOROSCOPY TIME:  7 min, 42 seconds  TECHNIQUE: The procedure risks, benefits, and alternatives were explained to the patient. Questions regarding the procedure were encouraged and answered. The patient understands and consents to the procedure.  Small amount of contrast confirmed position of the indwelling tube within the right collecting system.  A standard Glidewire was advanced through the indwelling tube after ligating the tube. The Glidewire was used to cannulate the encrusted tip of the indwelling tube. The Glidewire was successful, though there was some friction on removing the tube. A sterile clamp was used to maintain the Glidewire access on the pin-pull removal of the catheter.  Once the catheter was removed, a 4 French straight tip glide cath was advanced to the collecting system of the right kidney. The  standard Glidewire was then removed.  An Amplatz wire was then advanced through the glide cath to the collecting system after gently looping the end of the wire for safety purposes. This also increase the length of stiff wire within the tract.  The glide cath was removed from the Amplatz wire, and the Amplatz wire was used as the rail for placement of the new 45 cm 12 French pigtail catheter.  The pigtail catheter was formed in the collecting system and contrast was used to confirm position.  No complications were encountered. Although no significant blood loss was encountered, the patient did have some blood-tinged urine.  Patient tolerated the procedure well and remained hemodynamically stable throughout.  COMPLICATIONS: None  FINDINGS: Initial images demonstrate indwelling nephrostomy tube in the collecting system of the right kidney.  Images during the case demonstrate fluoroscopically guided exchange of 45 cm 12 French pigtail nephro ureterostomy.  The patient has tortuous surgical anatomy, with redundancy of the urostomy and a low implantation of the ureter.  IMPRESSION: Status post exchange of  patient's indwelling right-sided nephro ureteral tube via the patient's urostomy.  The patient has significant tortuosity of his surgical anatomy, making the exchange difficult. Successful steps detailed above.  Signed,  Dulcy Fanny. Earleen Newport, DO  [facility]  [facility]   Electronically Signed   By: Corrie Mckusick D.O.   On: 02/03/2014 13:25     EKG Interpretation None      MDM   Final diagnoses:  None    9:56 PM Filed Vitals:   02/04/14 1952 02/04/14 1958 02/04/14 2013  BP:  130/55   Pulse:  75   Temp:  101.2 F (38.4 C) 103.5 F (39.7 C)  TempSrc:   Rectal  Resp:  24   Height:  5' 8.5" (1.74 m)   Weight:  173 lb (78.472 kg)   SpO2: 92% 97%    Patient with rectal temp of 103.5 F, suspect urine as the cause. Urine /urine culture is pending. CXR is negative for acute abnormality.    Patient with a urinary tract infection. Septic however he has not in shock. Given IV Rocephin. I have spoken with Dr. Roel Cluck who will admit the patient.  C for admission. Patient receiving IV fluids. Minor hypotension at this time.  Margarita Mail, PA-C 02/05/14 [place], MD 02/05/14 [phone]

## 2014-02-04 NOTE — ED Notes (Signed)
Bed: [id number] Expected date:  Expected time:  Means of arrival:  Comments: EMS 51 M post op n/v neph tube repl yesterday

## 2014-02-05 ENCOUNTER — Inpatient Hospital Stay (HOSPITAL_COMMUNITY): Payer: Medicare Other

## 2014-02-05 ENCOUNTER — Encounter (HOSPITAL_COMMUNITY): Payer: Self-pay | Admitting: Radiology

## 2014-02-05 DIAGNOSIS — N3001 Acute cystitis with hematuria: Secondary | ICD-10-CM

## 2014-02-05 LAB — COMPREHENSIVE METABOLIC PANEL
ALK PHOS: 49 U/L (ref 39–117)
ALT: 12 U/L (ref 0–53)
ANION GAP: 12 (ref 5–15)
AST: 25 U/L (ref 0–37)
Albumin: 3 g/dL — ABNORMAL LOW (ref 3.5–5.2)
BILIRUBIN TOTAL: 0.4 mg/dL (ref 0.3–1.2)
BUN: 43 mg/dL — ABNORMAL HIGH (ref 6–23)
CHLORIDE: 104 meq/L (ref 96–112)
CO2: 25 meq/L (ref 19–32)
Calcium: 8.4 mg/dL (ref 8.4–10.5)
Creatinine, Ser: 2.51 mg/dL — ABNORMAL HIGH (ref 0.50–1.35)
GFR, EST AFRICAN AMERICAN: 24 mL/min — AB (ref >90)
GFR, EST NON AFRICAN AMERICAN: 21 mL/min — AB (ref >90)
GLUCOSE: 145 mg/dL — AB (ref 70–99)
POTASSIUM: 4.7 meq/L (ref 3.7–5.3)
SODIUM: 141 meq/L (ref 137–147)
Total Protein: 6.9 g/dL (ref 6.0–8.3)

## 2014-02-05 LAB — CBC
HEMATOCRIT: 32 % — AB (ref 39.0–52.0)
HEMOGLOBIN: 10.3 g/dL — AB (ref 13.0–17.0)
MCH: 30.7 pg (ref 26.0–34.0)
MCHC: 32.2 g/dL (ref 30.0–36.0)
MCV: 95.2 fL (ref 78.0–100.0)
Platelets: 128 10*3/uL — ABNORMAL LOW (ref 150–400)
RBC: 3.36 MIL/uL — AB (ref 4.22–5.81)
RDW: 15.3 % (ref 11.5–15.5)
WBC: 15.3 10*3/uL — AB (ref 4.0–10.5)

## 2014-02-05 LAB — PHOSPHORUS: Phosphorus: 4.2 mg/dL (ref 2.3–4.6)

## 2014-02-05 LAB — PROCALCITONIN: PROCALCITONIN: 5.98 ng/mL

## 2014-02-05 LAB — MAGNESIUM: Magnesium: 2.1 mg/dL (ref 1.5–2.5)

## 2014-02-05 LAB — TSH: TSH: 1.37 u[IU]/mL (ref 0.350–4.500)

## 2014-02-05 MED ORDER — ONDANSETRON HCL 4 MG PO TABS: 4.0000 mg | ORAL_TABLET | Freq: Four times a day (QID) | ORAL | Status: DC | PRN

## 2014-02-05 MED ORDER — ACETAMINOPHEN 650 MG RE SUPP: 650.0000 mg | Freq: Four times a day (QID) | RECTAL | Status: DC | PRN

## 2014-02-05 MED ORDER — DOCUSATE SODIUM 100 MG PO CAPS
100.0000 mg | ORAL_CAPSULE | Freq: Two times a day (BID) | ORAL | Status: DC
  Administered 2014-02-05 – 2014-02-07 (×6): 100 mg via ORAL
  Filled 2014-02-05 (×10): qty 1

## 2014-02-05 MED ORDER — ONDANSETRON HCL 4 MG/2ML IJ SOLN: 4.0000 mg | Freq: Four times a day (QID) | INTRAMUSCULAR | Status: DC | PRN

## 2014-02-05 MED ORDER — DEXTROSE 5 % IV SOLN
1.0000 g | INTRAVENOUS | Status: DC
  Administered 2014-02-05 – 2014-02-06 (×2): 1 g via INTRAVENOUS
  Filled 2014-02-05 (×3): qty 10

## 2014-02-05 MED ORDER — ASPIRIN EC 81 MG PO TBEC
81.0000 mg | DELAYED_RELEASE_TABLET | Freq: Every day | ORAL | Status: DC
  Administered 2014-02-05 – 2014-02-09 (×5): 81 mg via ORAL
  Filled 2014-02-05 (×5): qty 1

## 2014-02-05 MED ORDER — SODIUM CHLORIDE 0.9 % IJ SOLN
3.0000 mL | Freq: Two times a day (BID) | INTRAMUSCULAR | Status: DC
  Administered 2014-02-06 – 2014-02-08 (×4): 3 mL via INTRAVENOUS

## 2014-02-05 MED ORDER — HYDROCODONE-ACETAMINOPHEN 5-325 MG PO TABS: 1.0000 | ORAL_TABLET | ORAL | Status: DC | PRN

## 2014-02-05 MED ORDER — ACETAMINOPHEN 325 MG PO TABS
650.0000 mg | ORAL_TABLET | Freq: Four times a day (QID) | ORAL | Status: DC | PRN
  Administered 2014-02-06: 650 mg via ORAL
  Filled 2014-02-05: qty 2

## 2014-02-05 MED ORDER — SODIUM CHLORIDE 0.9 % IV SOLN
INTRAVENOUS | Status: AC
  Administered 2014-02-05 (×2): via INTRAVENOUS

## 2014-02-05 NOTE — Plan of Care (Signed)
Problem: Phase I Progression Outcomes Goal: Pain controlled with appropriate interventions Outcome: Completed/Met Date Met:  02/05/14 Goal: OOB as tolerated unless otherwise ordered Outcome: Progressing Goal: Initial discharge plan identified Outcome: Completed/Met Date Met:  02/05/14 Goal: Voiding-avoid urinary catheter unless indicated Outcome: Adequate for Discharge Goal: Hemodynamically stable Outcome: Completed/Met Date Met:  02/05/14  Problem: Phase II Progression Outcomes Goal: Progress activity as tolerated unless otherwise ordered Outcome: Progressing Goal: Vital signs remain stable Outcome: Completed/Met Date Met:  02/05/14 Goal: IV changed to normal saline lock Outcome: Completed/Met Date Met:  02/05/14 Goal: Obtain order to discontinue catheter if appropriate Outcome: Not Applicable Date Met:  52/17/47  Problem: Phase III Progression Outcomes Goal: Pain controlled on oral analgesia Outcome: Completed/Met Date Met:  02/05/14

## 2014-02-05 NOTE — Progress Notes (Signed)
Patient seen and evaluated earlier this am by my associate. In lieu of urological history agree with consulting Urology. I have discussed case with Urologist who will evaluate patient.    Will reassess next am. VSS and agree with continuing with IV antibiotics rocephin. Will f/u with urine culture.  Will reassess next am unless there is an acute change in condition requiring further evaluation.  Janielle Mittelstadt, [employer]

## 2014-02-05 NOTE — Consult Note (Addendum)
Urology Consult   Physician requesting consult: Velvet Bathe, MD  Reason for consult: Sepsis  History of Present Illness: [name] [name] is a 80 y.o. male with a history of multiple medical problems (Hypertension; Aortic valve stenosis; Gout; bladder cancer; Deep venous thrombosis; Osteoporosis; Renal failure; Heart murmur; Depression; Chronic kidney disease; Kidney stone; Prostate cancer; Bradycardia; S/P ileal conduit; Macular degeneration; Pacemaker; and Hydronephrosis with obstructing calculus).  He presented to the ER yesterday with a working diagnosis of urosepsis.  He has a chronic indwelling right ureteral stent for management of a ureteral stricture between his right kidney and ileal conduit.  The ileal conduit is 78 years old and was created during cystectomy for bladder cancer.  He also has a history of bilateral kidney stones and most recently underwent a stone treatment with Dr. Jeffie Pollock this past May.  His right sided ureteral stent is exchanged in a retrograde fashion by VIR every 6 weeks.  It was most recently changed two days ago.     He presented yesterday with fever and chills. Family brought him to ER and was found to have UA significant for nitrites and many bacteria. Patient was noted to be febrile up to 103.  WBC was elevated to 17.2 and has improved to 15.3  His creatinine is now 2.5 which is near his baseline of 2.2 to 2.4.  Blood and urine cultures were sent from the ER and he was started on Ceftriaxone.  His last fever was 103.5 at 813pm last night.  He has been afebrile since then.  HR has been in the 70s and BP 0000000 systolic.  He is overall feeling better this morning.  No flank pain.  No there complaints at this time.  Past Medical History  Diagnosis Date  . Hypertension   . Aortic valve stenosis   . Gout   . Hx of bladder cancer   . Deep venous thrombosis     hx of  LEFT ARM AFTER PACEMAKER INSERTION ABOUT 6 YRS AGO  . Osteoporosis   . Renal failure   . Heart  murmur   . Depression   . Chronic kidney disease   . Kidney stone   . Prostate cancer     AND BLADDER CANCER - S/P URETEROILEAL CONDUIT  . Bradycardia     HX OF SYMPTOMATIC BRADYCARDIA - STATUS POST PERMANENT PACEMAKER  . S/P ileal conduit     HX BLADDER AND PROSTATE CANCER  . Macular degeneration     PT STATES HIS VISION STILL OKAY FOR DRIVING  . 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 GREGG TAYLOR -ELECTROPHYSIOLOGIST  . Hydronephrosis with obstructing calculus     RIGHT SIDE - HOSPITALIZED 07-14-13 OT 07-17-13    Past Surgical History  Procedure Laterality Date  . Insert / replace / remove pacemaker    . Cystectomy    . Lymphadenectomy    . Prostate surgery    . Appendectomy    . Cataract extraction, bilateral    . Kidney stone surgery    . Ilial conduit      for bladder cancer  . Nephrolithotomy  09/02/2011    Procedure: NEPHROLITHOTOMY PERCUTANEOUS;  Surgeon: Malka So, MD;  Location: [facility];  Service: Urology;  Laterality: Left;  . 07/20/13  conversion of right sided nephrostomy catheter to right sided nephro ureteral catheter - done in interventional radiology    . Nephrolithotomy Right 07/27/2013    Procedure: RIGHT PERCUTANEOUS NEPHROLITHOTOMY ;  Surgeon: Jenny Reichmann  Jeffie Pollock, MD;  Location: [facility];  Service: Urology;  Laterality: Right;    Medications:  Home meds:    Medication List    ASK your doctor about these medications        acetaminophen 500 MG tablet  Commonly known as:  TYLENOL  Take 1,000 mg by mouth every 8 (eight) hours as needed for mild pain or headache.     amLODipine 5 MG tablet  Commonly known as:  NORVASC  Take 5 mg by mouth every morning.     aspirin EC 81 MG tablet  Take 81 mg by mouth every morning.     CLEAR EYES COMPLETE OP  Apply 1-2 drops to eye daily as needed (dry eyes).     docusate sodium 100 MG capsule  Commonly known as:  COLACE  Take 100 mg by mouth daily.     OMEGA-3 KRILL OIL PO  Take 1 tablet by mouth every  morning.     PRESERVISION AREDS 2 PO  Take 1 tablet by mouth 2 (two) times daily.     Vitamin D-3 1000 UNITS Caps  Take 1 capsule by mouth every morning.        Scheduled Meds: . aspirin EC  81 mg Oral Daily  . cefTRIAXone (ROCEPHIN) IVPB 1 gram/50 mL D5W  1 g Intravenous Q24H  . docusate sodium  100 mg Oral BID  . sodium chloride  3 mL Intravenous Q12H   Continuous Infusions:  PRN Meds:.acetaminophen **OR** acetaminophen, HYDROcodone-acetaminophen, ondansetron **OR** ondansetron (ZOFRAN) IV  Allergies: No Known Allergies  Family History  Problem Relation Age of Onset  . Prostate cancer Father 49    died  . Pancreatic cancer Mother 46    died    Social History:  reports that he quit smoking about 61 years ago. His smoking use included Cigarettes. He smoked 0.00 packs per day. He has never used smokeless tobacco. He reports that he does not drink alcohol or use illicit drugs.  ROS: A complete review of systems was performed.  All systems are negative except for pertinent findings as noted.  Physical Exam:  Vital signs in last 24 hours: Temp:  [97.6 F (36.4 C)-103.5 F (39.7 C)] 97.9 F (36.6 C) (11/14 0647) Pulse Rate:  [61-86] 74 (11/14 0647) Resp:  [17-24] 20 (11/14 0647) BP: (99-138)/(42-88) 116/59 mmHg (11/14 0647) SpO2:  [90 %-100 %] 99 % (11/14 0647) Weight:  [76.567 kg (168 lb 12.8 oz)-78.472 kg (173 lb)] 76.567 kg (168 lb 12.8 oz) (11/14 0302) Constitutional:  Alert and oriented, No acute distress Cardiovascular: Regular rate and rhythm, No JVD Respiratory: Normal respiratory effort, Lungs clear bilaterally GI: Abdomen is soft, nontender, nondistended, no abdominal masses Genitourinary: No CVAT.  Urostomy is in right lower quadrant, right stent appears to be in place, exiting stoma into the bag. Lymphatic: No lymphadenopathy Neurologic: Grossly intact, no focal deficits Psychiatric: Normal mood and affect  Laboratory Data:   Recent Labs   02/04/14 2116 02/04/14 2125 02/05/14 0454  WBC 17.2*  --  15.3*  HGB 10.7* 11.2* 10.3*  HCT 33.0* 33.0* 32.0*  PLT 161  --  128*     Recent Labs  02/04/14 2116 02/04/14 2125 02/05/14 0454  NA 141 141 141  K 5.1 4.8 4.7  CL 104 105 104  GLUCOSE 125* 123* 145*  BUN 41* 44* 43*  CALCIUM 9.1  --  8.4  CREATININE 2.41* 2.50* 2.51*     Results for orders placed or performed during  the hospital encounter of 02/04/14 (from the past 24 hour(s))  Basic metabolic panel     Status: Abnormal   Collection Time: 02/04/14  9:16 PM  Result Value Ref Range   Sodium 141 137 - 147 mEq/L   Potassium 5.1 3.7 - 5.3 mEq/L   Chloride 104 96 - 112 mEq/L   CO2 24 19 - 32 mEq/L   Glucose, Bld 125 (H) 70 - 99 mg/dL   BUN 41 (H) 6 - 23 mg/dL   Creatinine, Ser 2.41 (H) 0.50 - 1.35 mg/dL   Calcium 9.1 8.4 - 10.5 mg/dL   GFR calc non Af Amer 22 (L) >90 mL/min   GFR calc Af Amer 26 (L) >90 mL/min   Anion gap 13 5 - 15  CBC     Status: Abnormal   Collection Time: 02/04/14  9:16 PM  Result Value Ref Range   WBC 17.2 (H) 4.0 - 10.5 K/uL   RBC 3.55 (L) 4.22 - 5.81 MIL/uL   Hemoglobin 10.7 (L) 13.0 - 17.0 g/dL   HCT 33.0 (L) 39.0 - 52.0 %   MCV 93.0 78.0 - 100.0 fL   MCH 30.1 26.0 - 34.0 pg   MCHC 32.4 30.0 - 36.0 g/dL   RDW 14.7 11.5 - 15.5 %   Platelets 161 150 - 400 K/uL  I-stat troponin, ED     Status: None   Collection Time: 02/04/14  9:24 PM  Result Value Ref Range   Troponin i, poc 0.01 0.00 - 0.08 ng/mL   Comment 3          I-stat Chem 8, ED     Status: Abnormal   Collection Time: 02/04/14  9:25 PM  Result Value Ref Range   Sodium 141 137 - 147 mEq/L   Potassium 4.8 3.7 - 5.3 mEq/L   Chloride 105 96 - 112 mEq/L   BUN 44 (H) 6 - 23 mg/dL   Creatinine, Ser 2.50 (H) 0.50 - 1.35 mg/dL   Glucose, Bld 123 (H) 70 - 99 mg/dL   Calcium, Ion 1.14 1.13 - 1.30 mmol/L   TCO2 24 0 - 100 mmol/L   Hemoglobin 11.2 (L) 13.0 - 17.0 g/dL   HCT 33.0 (L) 39.0 - 52.0 %  I-Stat CG4 Lactic Acid, ED      Status: None   Collection Time: 02/04/14  9:25 PM  Result Value Ref Range   Lactic Acid, Venous 1.86 0.5 - 2.2 mmol/L  Urinalysis, Routine w reflex microscopic     Status: Abnormal   Collection Time: 02/04/14  9:57 PM  Result Value Ref Range   Color, Urine YELLOW YELLOW   APPearance CLOUDY (A) CLEAR   Specific Gravity, Urine 1.011 1.005 - 1.030   pH 7.5 5.0 - 8.0   Glucose, UA NEGATIVE NEGATIVE mg/dL   Hgb urine dipstick LARGE (A) NEGATIVE   Bilirubin Urine NEGATIVE NEGATIVE   Ketones, ur NEGATIVE NEGATIVE mg/dL   Protein, ur 100 (A) NEGATIVE mg/dL   Urobilinogen, UA 0.2 0.0 - 1.0 mg/dL   Nitrite POSITIVE (A) NEGATIVE   Leukocytes, UA LARGE (A) NEGATIVE  Urine microscopic-add on     Status: Abnormal   Collection Time: 02/04/14  9:57 PM  Result Value Ref Range   Squamous Epithelial / LPF RARE RARE   WBC, UA 11-20 <3 WBC/hpf   RBC / HPF 3-6 <3 RBC/hpf   Bacteria, UA MANY (A) RARE  Procalcitonin - Baseline     Status: None   Collection Time: 02/05/14  1:38 AM  Result Value Ref Range   Procalcitonin 5.98 ng/mL  Magnesium     Status: None   Collection Time: 02/05/14  4:54 AM  Result Value Ref Range   Magnesium 2.1 1.5 - 2.5 mg/dL  Phosphorus     Status: None   Collection Time: 02/05/14  4:54 AM  Result Value Ref Range   Phosphorus 4.2 2.3 - 4.6 mg/dL  TSH     Status: None   Collection Time: 02/05/14  4:54 AM  Result Value Ref Range   TSH 1.370 0.350 - 4.500 uIU/mL  Comprehensive metabolic panel     Status: Abnormal   Collection Time: 02/05/14  4:54 AM  Result Value Ref Range   Sodium 141 137 - 147 mEq/L   Potassium 4.7 3.7 - 5.3 mEq/L   Chloride 104 96 - 112 mEq/L   CO2 25 19 - 32 mEq/L   Glucose, Bld 145 (H) 70 - 99 mg/dL   BUN 43 (H) 6 - 23 mg/dL   Creatinine, Ser 2.51 (H) 0.50 - 1.35 mg/dL   Calcium 8.4 8.4 - 10.5 mg/dL   Total Protein 6.9 6.0 - 8.3 g/dL   Albumin 3.0 (L) 3.5 - 5.2 g/dL   AST 25 0 - 37 U/L   ALT 12 0 - 53 U/L   Alkaline Phosphatase 49 39 - 117  U/L   Total Bilirubin 0.4 0.3 - 1.2 mg/dL   GFR calc non Af Amer 21 (L) >90 mL/min   GFR calc Af Amer 24 (L) >90 mL/min   Anion gap 12 5 - 15  CBC     Status: Abnormal   Collection Time: 02/05/14  4:54 AM  Result Value Ref Range   WBC 15.3 (H) 4.0 - 10.5 K/uL   RBC 3.36 (L) 4.22 - 5.81 MIL/uL   Hemoglobin 10.3 (L) 13.0 - 17.0 g/dL   HCT 32.0 (L) 39.0 - 52.0 %   MCV 95.2 78.0 - 100.0 fL   MCH 30.7 26.0 - 34.0 pg   MCHC 32.2 30.0 - 36.0 g/dL   RDW 15.3 11.5 - 15.5 %   Platelets 128 (L) 150 - 400 K/uL   No results found for this or any previous visit (from the past 240 hour(s)).  Renal Function:  Recent Labs  02/04/14 2116 02/04/14 2125 02/05/14 0454  CREATININE 2.41* 2.50* 2.51*   Estimated Creatinine Clearance: 18.9 mL/min (by C-G formula based on Cr of 2.51).  Radiologic Imaging: Dg Chest Port 1 View  02/04/2014   CLINICAL DATA:  c/o of near syncope pt states "got swimmy headed", lowered himself to the ground, EMS report he was mildly pale, family remark on slight slurred speech after the incidence, c/o of vomiting as well, of note patient had his Urostomy tune replaced yesterday, denies any problems with that. Pt denies any pain at this time, in NAD.  EXAM: PORTABLE CHEST - 1 VIEW  COMPARISON:  07/29/2013  FINDINGS: Pacer with leads at right atrium and right ventricle. No lead discontinuity. Midline trachea. Mild cardiomegaly. Mediastinal contours otherwise within normal limits. No pleural effusion or pneumothorax. No congestive failure. Clear lungs.  IMPRESSION: Mild cardiomegaly, without acute disease.   Electronically Signed   By: Abigail Miyamoto M.D.   On: 02/04/2014 20:39    I independently reviewed the above imaging studies.  Impression/Recommendation 80 yo with bladder cancer s/p cystectomy and ileal conduit now with likely urosepsis in the setting of recent right stent change for management of right ureteral stricture.  His current condition is likely related to recent  manipulation for exchange of right ureteral stent.  He appears to be improving clinically with rocephin.  1) Recommend CT Abd/Pelvis non-contrasted to evaluate stone burden and confirm no left sided obstructing stone (I have ordered study) 2) Agree with current medical management with antibiotics and following cultures 3) Will continue to follow      Seen, examined and discussed with Dr. Baltazar Najjar and agree with assessment and plan.  CC: Dr. Velvet Bathe.

## 2014-02-05 NOTE — Progress Notes (Signed)
Patient ID: [name] [name], male   DOB: Nov 25, 1921, 80 y.o.   MRN: [id number] CT films reviewed.  There is a LLP stone but no ureteral stones or hydro.   The right NU stent is in good position.  There is a decrease in size in the right renal cyst.   He has stable gallstones.  He reports that he is feeling better and is now afebrile.    Imp: Urosepsis post tube change.  Rec: Continue IV antibiotics per Hospital service.

## 2014-02-06 LAB — URINE CULTURE: Colony Count: >=100000

## 2014-02-06 LAB — PROCALCITONIN: Procalcitonin: 7.35 ng/mL

## 2014-02-06 NOTE — Progress Notes (Signed)
Patient ID: [name] [name], male   DOB: 1921-10-17, 80 y.o.   MRN: [id number]    Subjective: [name] [name] is doing well today with no pain and his last fever was last night.   He had probable staph in 1 of 2 blood cultures and Mx species in the urine.   ROS:  Review of Systems  Constitutional: Negative for fever and chills.  Gastrointestinal: Negative for abdominal pain.  Genitourinary: Negative for hematuria and flank pain.    Anti-infectives: Anti-infectives    Start     Dose/Rate Route Frequency Ordered Stop   02/05/14 2200  cefTRIAXone (ROCEPHIN) 1 g in dextrose 5 % 50 mL IVPB     1 g100 mL/hr over 30 Minutes Intravenous Every 24 hours 02/05/14 0302     02/04/14 2130  cefTRIAXone (ROCEPHIN) 1 g in dextrose 5 % 50 mL IVPB     1 g100 mL/hr over 30 Minutes Intravenous  Once 02/04/14 2117 02/04/14 2221      Current Facility-Administered Medications  Medication Dose Route Frequency Provider Last Rate Last Dose  . acetaminophen (TYLENOL) tablet 650 mg  650 mg Oral Q6H PRN Toy Baker, MD   650 mg at 02/06/14 0000   Or  . acetaminophen (TYLENOL) suppository 650 mg  650 mg Rectal Q6H PRN Toy Baker, MD      . aspirin EC tablet 81 mg  81 mg Oral Daily Toy Baker, MD   81 mg at 02/05/14 0944  . cefTRIAXone (ROCEPHIN) 1 g in dextrose 5 % 50 mL IVPB  1 g Intravenous Q24H Toy Baker, MD   1 g at 02/05/14 2029  . docusate sodium (COLACE) capsule 100 mg  100 mg Oral BID Toy Baker, MD   100 mg at 02/05/14 2030  . HYDROcodone-acetaminophen (NORCO/VICODIN) 5-325 MG per tablet 1-2 tablet  1-2 tablet Oral Q4H PRN Toy Baker, MD      . ondansetron (ZOFRAN) tablet 4 mg  4 mg Oral Q6H PRN Toy Baker, MD       Or  . ondansetron (ZOFRAN) injection 4 mg  4 mg Intravenous Q6H PRN Toy Baker, MD      . sodium chloride 0.9 % injection 3 mL  3 mL Intravenous Q12H Toy Baker, MD   3 mL at 02/05/14 0315     Objective: Vital signs in last 24  hours: Temp:  [97.6 F (36.4 C)-101.1 F (38.4 C)] 97.6 F (36.4 C) (11/15 0652) Pulse Rate:  [75-80] 75 (11/15 0652) Resp:  [16-20] 16 (11/15 0652) BP: (102-129)/(37-68) 129/68 mmHg (11/15 0652) SpO2:  [92 %-96 %] 96 % (11/15 0652)  Intake/Output from previous day: 11/14 0701 - 11/15 0700 In: 1070 [P.O.:1020; IV Piggyback:50] Out: 1000 [Urine:1000] Intake/Output this shift:     Physical Exam  Constitutional: He is well-developed, well-nourished, and in no distress.  Abdominal: Soft. He exhibits no distension. There is no tenderness.    Lab Results:   Recent Labs  02/04/14 2116 02/04/14 2125 02/05/14 0454  WBC 17.2*  --  15.3*  HGB 10.7* 11.2* 10.3*  HCT 33.0* 33.0* 32.0*  PLT 161  --  128*   BMET  Recent Labs  02/04/14 2116 02/04/14 2125 02/05/14 0454  NA 141 141 141  K 5.1 4.8 4.7  CL 104 105 104  CO2 24  --  25  GLUCOSE 125* 123* 145*  BUN 41* 44* 43*  CREATININE 2.41* 2.50* 2.51*  CALCIUM 9.1  --  8.4   PT/INR No results for input(s):  LABPROT, INR in the last 72 hours. ABG No results for input(s): PHART, HCO3 in the last 72 hours.  Invalid input(s): PCO2, PO2  Studies/Results: Dg Chest Port 1 View  02/04/2014   CLINICAL DATA:  c/o of near syncope pt states "got swimmy headed", lowered himself to the ground, EMS report he was mildly pale, family remark on slight slurred speech after the incidence, c/o of vomiting as well, of note patient had his Urostomy tune replaced yesterday, denies any problems with that. Pt denies any pain at this time, in NAD.  EXAM: PORTABLE CHEST - 1 VIEW  COMPARISON:  07/29/2013  FINDINGS: Pacer with leads at right atrium and right ventricle. No lead discontinuity. Midline trachea. Mild cardiomegaly. Mediastinal contours otherwise within normal limits. No pleural effusion or pneumothorax. No congestive failure. Clear lungs.  IMPRESSION: Mild cardiomegaly, without acute disease.   Electronically Signed   By: Abigail Miyamoto M.D.    On: 02/04/2014 20:39   Ct Renal Stone Study  02/05/2014   CLINICAL DATA:  Fever, elevated WBC, status post ureteral stent exchange, evaluate for left side kidney stones/obstruction, status post cystectomy and ileal conduit, bladder cancer, appendix  EXAM: CT ABDOMEN AND PELVIS WITHOUT CONTRAST  TECHNIQUE: Multidetector CT imaging of the abdomen and pelvis was performed following the standard protocol without IV contrast.  COMPARISON:  07/28/2013  FINDINGS: Lung bases are unremarkable. Sagittal images of the spine shows osteopenia and degenerative changes thoracolumbar spine.  Partially visualized cardiac pacemaker leads. Unenhanced liver is unremarkable. Multiple calcified gallstones are noted in dependent gallbladder largest measures 9 mm. Unenhanced pancreas, spleen and adrenal glands are unremarkable. Again noted status post cystectomy and prostatectomy with ileal conduit in right lower quadrant. There is a right ureteral stent exiting ileal conduit in right lower quadrant. No right hydronephrosis. Exophytic cyst in posterior aspect midpole of the right kidney measures 3.5 cm on the prior exam measures 4.3 cm.  No left hydronephrosis is noted. There is a cyst in midpole of the left kidney medial aspect measures 1.5 cm. Nonobstructive calculus in lower pole of the left kidney measures 6.4 mm. No ureteral calculi are noted bilaterally.  No small bowel obstruction. There is a small lower midline pelvic wall ventral hernia containing fat and small bowel loop without evidence of acute complication. A left inguinal hernia containing fat measures 3.9 cm. No evidence of acute complication. Moderate distension of the rectum with gas. Mild gaseous distension of sigmoid colon.  IMPRESSION: 1. There is a right ureteral stent in place. The stent is exiting in right lower quadrant through the ileal conduit. No hydronephrosis. Again noted postsurgical changes post cystectomy and prostatectomy. 2. There is a cyst in midpole  of the right kidney posteriorly measures 3.5 cm decreased in size from prior exam. Left nonobstructive nephrolithiasis. No left ureteral calculi are noted. No left hydronephrosis. 3. Calcified gallstones are noted within gallbladder the largest measures 9 mm. 4. No small bowel obstruction. Moderate gaseous distension of sigmoid colon. There is gas in the rectum. There is left inguinal hernia containing fat without evidence of acute complication.   Electronically Signed   By: Lahoma Crocker M.D.   On: 02/05/2014 15:30   Labs and CT reviewed.   Assessment: He has probable staph in a blood culture but is doing well on current therapy.  Plan: Continue current care with switch to oral based on sensitivities.     LOS: 2 days    Irine Seal J 02/06/2014

## 2014-02-06 NOTE — Plan of Care (Signed)
Problem: Phase I Progression Outcomes Goal: OOB as tolerated unless otherwise ordered Outcome: Completed/Met Date Met:  02/06/14 Goal: Voiding-avoid urinary catheter unless indicated Outcome: Completed/Met Date Met:  02/06/14 Goal: Other Phase I Outcomes/Goals Outcome: Completed/Met Date Met:  02/06/14  Problem: Phase II Progression Outcomes Goal: Progress activity as tolerated unless otherwise ordered Outcome: Completed/Met Date Met:  02/06/14 Goal: Discharge plan established Outcome: Completed/Met Date Met:  02/06/14 Goal: Other Phase II Outcomes/Goals Outcome: Completed/Met Date Met:  02/06/14  Problem: Phase III Progression Outcomes Goal: Voiding independently Outcome: Completed/Met Date Met:  02/06/14 Goal: Foley discontinued Outcome: Not Applicable Date Met:  73/57/89

## 2014-02-06 NOTE — Progress Notes (Signed)
[facility] PROGRESS NOTE  RANGEL CANNADA [id number] DOB: September 26, 1921 DOA: 02/04/2014 PCP: Jerlyn Ly, MD  Assessment/Plan: Infection of urinary tract - Will continue current antibiotic regimen - sensitivities reporting makes bacterial morphologies. - Group B strep agalactiae growing from one out of 2 blood cultures. Could be contaminant - WBC trending down on cephalosporin   Active Problems:   Obstructive uropathy -urology on board, CT reports no hydronephrosis     Code Status: full Family Communication: discussed with son at bedside Disposition Plan: Pending culture results and clinical condition   Consultants: - Urology  Procedures:  none  Antibiotics:  ceftriaxone  HPI/Subjective: The patient has no new complaints. No acute issues overnight  Objective: Filed Vitals:   02/06/14 1438  BP: 139/65  Pulse: 74  Temp: 97.9 F (36.6 C)  Resp: 16    Intake/Output Summary (Last 24 hours) at 02/06/14 1441 Last data filed at 02/06/14 1440  Gross per 24 hour  Intake    590 ml  Output   1625 ml  Net  -1035 ml   Filed Weights   02/04/14 1958 02/05/14 0302  Weight: 78.472 kg (173 lb) 76.567 kg (168 lb 12.8 oz)    Exam:   General:  Patient in no acute distress alert and awake  Cardiovascular: warm extremities, no cyanosis  Respiratory: no increased work of breathing, no audible wheezes, equal chest rise  Abdomen: nondistended, no guarding  Musculoskeletal: no cyanosis on limited exam   Data Reviewed: Basic Metabolic Panel:  Recent Labs Lab 02/04/14 2116 02/04/14 2125 02/05/14 0454  NA 141 141 141  K 5.1 4.8 4.7  CL 104 105 104  CO2 24  --  25  GLUCOSE 125* 123* 145*  BUN 41* 44* 43*  CREATININE 2.41* 2.50* 2.51*  CALCIUM 9.1  --  8.4  MG  --   --  2.1  PHOS  --   --  4.2   Liver Function Tests:  Recent Labs Lab 02/05/14 0454  AST 25  ALT 12  ALKPHOS 49  BILITOT 0.4  PROT 6.9  ALBUMIN 3.0*   No results for  input(s): LIPASE, AMYLASE in the last 168 hours. No results for input(s): AMMONIA in the last 168 hours. CBC:  Recent Labs Lab 02/04/14 2116 02/04/14 2125 02/05/14 0454  WBC 17.2*  --  15.3*  HGB 10.7* 11.2* 10.3*  HCT 33.0* 33.0* 32.0*  MCV 93.0  --  95.2  PLT 161  --  128*   Cardiac Enzymes: No results for input(s): CKTOTAL, CKMB, CKMBINDEX, TROPONINI in the last 168 hours. BNP (last 3 results) No results for input(s): PROBNP in the last 8760 hours. CBG: No results for input(s): GLUCAP in the last 168 hours.  Recent Results (from the past 240 hour(s))  Urine culture     Status: None   Collection Time: 02/04/14  9:17 PM  Result Value Ref Range Status   Specimen Description URINE, RANDOM  Final   Special Requests NONE  Final   Culture  Setup Time   Final    02/05/2014 04:22 Performed at [facility]   Final    >=100,000 COLONIES/ML Performed at [facility]    Culture   Final    Multiple bacterial morphotypes present, none predominant. Suggest appropriate recollection if clinically indicated. Performed at [facility]    Report Status 02/06/2014 FINAL  Final  Culture, blood (routine x 2)     Status: None (Preliminary result)  Collection Time: 02/05/14  1:16 AM  Result Value Ref Range Status   Specimen Description BLOOD LEFT ANTECUBITAL  Final   Special Requests BOTTLES DRAWN AEROBIC AND ANAEROBIC 4CC  Final   Culture  Setup Time   Final    02/05/2014 11:01 Performed at [facility]    Culture   Final           BLOOD CULTURE RECEIVED NO GROWTH TO DATE CULTURE WILL BE HELD FOR 5 DAYS BEFORE ISSUING A FINAL NEGATIVE REPORT Performed at [facility]    Report Status PENDING  Incomplete  Culture, blood (routine x 2)     Status: None (Preliminary result)   Collection Time: 02/05/14  1:40 AM  Result Value Ref Range Status   Specimen Description BLOOD BLOOD RIGHT FOREARM  Final   Special Requests BOTTLES DRAWN  AEROBIC AND ANAEROBIC 5CC  Final   Culture  Setup Time   Final    02/05/2014 11:01 Performed at [facility]    Culture   Final    GROUP B STREP(S.AGALACTIAE)ISOLATED Note: Gram Stain Report Called to,Read Back By and Verified With: JENNY BURNS 02/05/14 @ 7:18PM BY RUSCOE A. Performed at [facility]    Report Status PENDING  Incomplete     Studies: Dg Chest [place] 1 View  02/04/2014   CLINICAL DATA:  c/o of near syncope pt states "got swimmy headed", lowered himself to the ground, EMS report he was mildly pale, family remark on slight slurred speech after the incidence, c/o of vomiting as well, of note patient had his Urostomy tune replaced yesterday, denies any problems with that. Pt denies any pain at this time, in NAD.  EXAM: PORTABLE CHEST - 1 VIEW  COMPARISON:  07/29/2013  FINDINGS: Pacer with leads at right atrium and right ventricle. No lead discontinuity. Midline trachea. Mild cardiomegaly. Mediastinal contours otherwise within normal limits. No pleural effusion or pneumothorax. No congestive failure. Clear lungs.  IMPRESSION: Mild cardiomegaly, without acute disease.   Electronically Signed   By: Abigail Miyamoto M.D.   On: 02/04/2014 20:39   Ct Renal Stone Study  02/05/2014   CLINICAL DATA:  Fever, elevated WBC, status post ureteral stent exchange, evaluate for left side kidney stones/obstruction, status post cystectomy and ileal conduit, bladder cancer, appendix  EXAM: CT ABDOMEN AND PELVIS WITHOUT CONTRAST  TECHNIQUE: Multidetector CT imaging of the abdomen and pelvis was performed following the standard protocol without IV contrast.  COMPARISON:  07/28/2013  FINDINGS: Lung bases are unremarkable. Sagittal images of the spine shows osteopenia and degenerative changes thoracolumbar spine.  Partially visualized cardiac pacemaker leads. Unenhanced liver is unremarkable. Multiple calcified gallstones are noted in dependent gallbladder largest measures 9 mm. Unenhanced pancreas,  spleen and adrenal glands are unremarkable. Again noted status post cystectomy and prostatectomy with ileal conduit in right lower quadrant. There is a right ureteral stent exiting ileal conduit in right lower quadrant. No right hydronephrosis. Exophytic cyst in posterior aspect midpole of the right kidney measures 3.5 cm on the prior exam measures 4.3 cm.  No left hydronephrosis is noted. There is a cyst in midpole of the left kidney medial aspect measures 1.5 cm. Nonobstructive calculus in lower pole of the left kidney measures 6.4 mm. No ureteral calculi are noted bilaterally.  No small bowel obstruction. There is a small lower midline pelvic wall ventral hernia containing fat and small bowel loop without evidence of acute complication. A left inguinal hernia containing fat measures 3.9 cm. No  evidence of acute complication. Moderate distension of the rectum with gas. Mild gaseous distension of sigmoid colon.  IMPRESSION: 1. There is a right ureteral stent in place. The stent is exiting in right lower quadrant through the ileal conduit. No hydronephrosis. Again noted postsurgical changes post cystectomy and prostatectomy. 2. There is a cyst in midpole of the right kidney posteriorly measures 3.5 cm decreased in size from prior exam. Left nonobstructive nephrolithiasis. No left ureteral calculi are noted. No left hydronephrosis. 3. Calcified gallstones are noted within gallbladder the largest measures 9 mm. 4. No small bowel obstruction. Moderate gaseous distension of sigmoid colon. There is gas in the rectum. There is left inguinal hernia containing fat without evidence of acute complication.   Electronically Signed   By: Lahoma Crocker M.D.   On: 02/05/2014 15:30    Scheduled Meds: . aspirin EC  81 mg Oral Daily  . cefTRIAXone (ROCEPHIN) IVPB 1 gram/50 mL D5W  1 g Intravenous Q24H  . docusate sodium  100 mg Oral BID  . sodium chloride  3 mL Intravenous Q12H   Continuous Infusions:    Time spent: > 35  minutes    Velvet Bathe  [facility] Pager [phone] If 7PM-7AM, please contact night-coverage at [email or web address], password [facility] 02/06/2014, 2:41 PM  LOS: 2 days

## 2014-02-06 NOTE — Plan of Care (Signed)
Problem: Phase III Progression Outcomes Goal: Activity at appropriate level-compared to baseline (UP IN CHAIR FOR HEMODIALYSIS)  Outcome: Completed/Met Date Met:  02/06/14 Goal: IV/normal saline lock discontinued Outcome: Completed/Met Date Met:  02/06/14 Goal: Discharge plan remains appropriate-arrangements made Outcome: Completed/Met Date Met:  02/06/14 Goal: Other Phase III Outcomes/Goals Outcome: Completed/Met Date Met:  02/06/14

## 2014-02-07 DIAGNOSIS — R7881 Bacteremia: Secondary | ICD-10-CM

## 2014-02-07 DIAGNOSIS — B951 Streptococcus, group B, as the cause of diseases classified elsewhere: Secondary | ICD-10-CM

## 2014-02-07 DIAGNOSIS — N3000 Acute cystitis without hematuria: Secondary | ICD-10-CM

## 2014-02-07 LAB — CULTURE, BLOOD (ROUTINE X 2)

## 2014-02-07 LAB — CBC
HEMATOCRIT: 32.8 % — AB (ref 39.0–52.0)
Hemoglobin: 10.5 g/dL — ABNORMAL LOW (ref 13.0–17.0)
MCH: 30.3 pg (ref 26.0–34.0)
MCHC: 32 g/dL (ref 30.0–36.0)
MCV: 94.8 fL (ref 78.0–100.0)
PLATELETS: 163 10*3/uL (ref 150–400)
RBC: 3.46 MIL/uL — AB (ref 4.22–5.81)
RDW: 15 % (ref 11.5–15.5)
WBC: 9.6 10*3/uL (ref 4.0–10.5)

## 2014-02-07 MED ORDER — CEFUROXIME AXETIL 250 MG PO TABS
250.0000 mg | ORAL_TABLET | Freq: Two times a day (BID) | ORAL | Status: DC
  Administered 2014-02-07 – 2014-02-09 (×5): 250 mg via ORAL
  Filled 2014-02-07 (×6): qty 1

## 2014-02-07 NOTE — Progress Notes (Signed)
[facility] PROGRESS NOTE  ROHEN PLATTNER [id number] DOB: [birth year] DOA: 02/04/2014 PCP: Jerlyn Ly, MD  Assessment/Plan: Infection of urinary tract - sensitivities reporting makes bacterial morphologies. - Group B strep agalactiae growing from one out of 2 blood cultures. Most likely not a contaminant per my discussion with infectious disease. Patient has been improving on Rocephin and given his creatinine of more than 2.0 as well as his age would like to avoid PICC placement. As such we will try placing patient on oral cephalosporin  Bacteremia - Group B strep agalactiae, please see plan listed above - discuss with infectious disease  Active Problems:   Obstructive uropathy -urology on board, CT reports no hydronephrosis     Code Status: full Family Communication: discussed with son at bedside Disposition Plan: Pending culture results and clinical condition   Consultants: - Urology  Procedures:  none  Antibiotics:  Will place on cefuroxime  HPI/Subjective: The patient has no new complaints. No acute issues overnight  Objective: Filed Vitals:   02/07/14 1333  BP: 160/81  Pulse: 74  Temp: 98.3 F (36.8 C)  Resp: 18    Intake/Output Summary (Last 24 hours) at 02/07/14 1809 Last data filed at 02/07/14 1337  Gross per 24 hour  Intake    360 ml  Output   1825 ml  Net  -1465 ml   Filed Weights   02/04/14 1958 02/05/14 0302  Weight: 78.472 kg (173 lb) 76.567 kg (168 lb 12.8 oz)    Exam:   General:  Patient in no acute distress alert and awake  Cardiovascular: warm extremities, no cyanosis  Respiratory: no increased work of breathing, no audible wheezes, equal chest rise  Abdomen: nondistended, no guarding  Musculoskeletal: no cyanosis on limited exam   Data Reviewed: Basic Metabolic Panel:  Recent Labs Lab 02/04/14 2116 02/04/14 2125 02/05/14 0454  NA 141 141 141  K 5.1 4.8 4.7  CL 104 105 104  CO2 24  --  25  GLUCOSE  125* 123* 145*  BUN 41* 44* 43*  CREATININE 2.41* 2.50* 2.51*  CALCIUM 9.1  --  8.4  MG  --   --  2.1  PHOS  --   --  4.2   Liver Function Tests:  Recent Labs Lab 02/05/14 0454  AST 25  ALT 12  ALKPHOS 49  BILITOT 0.4  PROT 6.9  ALBUMIN 3.0*   No results for input(s): LIPASE, AMYLASE in the last 168 hours. No results for input(s): AMMONIA in the last 168 hours. CBC:  Recent Labs Lab 02/04/14 2116 02/04/14 2125 02/05/14 0454 02/07/14 0500  WBC 17.2*  --  15.3* 9.6  HGB 10.7* 11.2* 10.3* 10.5*  HCT 33.0* 33.0* 32.0* 32.8*  MCV 93.0  --  95.2 94.8  PLT 161  --  128* 163   Cardiac Enzymes: No results for input(s): CKTOTAL, CKMB, CKMBINDEX, TROPONINI in the last 168 hours. BNP (last 3 results) No results for input(s): PROBNP in the last 8760 hours. CBG: No results for input(s): GLUCAP in the last 168 hours.  Recent Results (from the past 240 hour(s))  Urine culture     Status: None   Collection Time: 02/04/14  9:17 PM  Result Value Ref Range Status   Specimen Description URINE, RANDOM  Final   Special Requests NONE  Final   Culture  Setup Time   Final    02/05/2014 04:22 Performed at [facility]   Final    >=  100,000 COLONIES/ML Performed at [facility]   Final    Multiple bacterial morphotypes present, none predominant. Suggest appropriate recollection if clinically indicated. Performed at [facility]    Report Status 02/06/2014 FINAL  Final  Culture, blood (routine x 2)     Status: None (Preliminary result)   Collection Time: 02/05/14  1:16 AM  Result Value Ref Range Status   Specimen Description BLOOD LEFT ANTECUBITAL  Final   Special Requests BOTTLES DRAWN AEROBIC AND ANAEROBIC 4CC  Final   Culture  Setup Time   Final    02/05/2014 11:01 Performed at [facility]    Culture   Final           BLOOD CULTURE RECEIVED NO GROWTH TO DATE CULTURE WILL BE HELD FOR 5 DAYS BEFORE ISSUING A FINAL  NEGATIVE REPORT Performed at [facility]    Report Status PENDING  Incomplete  Culture, blood (routine x 2)     Status: None   Collection Time: 02/05/14  1:40 AM  Result Value Ref Range Status   Specimen Description BLOOD BLOOD RIGHT FOREARM  Final   Special Requests BOTTLES DRAWN AEROBIC AND ANAEROBIC 5CC  Final   Culture  Setup Time   Final    02/05/2014 11:01 Performed at [facility]    Culture   Final    GROUP B STREP(S.AGALACTIAE)ISOLATED Note: Gram Stain Report Called to,Read Back By and Verified With: JENNY BURNS 02/05/14 @ 7:18PM BY RUSCOE A. Performed at [facility]    Report Status 02/07/2014 FINAL  Final   Organism ID, Bacteria GROUP B STREP(S.AGALACTIAE)ISOLATED  Final      Susceptibility   Group b strep(s.agalactiae)isolated - MIC*    AMPICILLIN <=0.25 SENSITIVE Sensitive     PENICILLIN <=0.06 SENSITIVE Sensitive     CLINDAMYCIN <=0.25 SENSITIVE Sensitive     ERYTHROMYCIN <=0.12 SENSITIVE Sensitive     VANCOMYCIN 0.5 SENSITIVE Sensitive     LEVOFLOXACIN 1 SENSITIVE Sensitive     * GROUP B STREP(S.AGALACTIAE)ISOLATED     Studies: No results found.  Scheduled Meds: . aspirin EC  81 mg Oral Daily  . cefUROXime  250 mg Oral BID WC  . docusate sodium  100 mg Oral BID  . sodium chloride  3 mL Intravenous Q12H   Continuous Infusions:    Time spent: > 35 minutes    Velvet Bathe  [facility] Pager [phone] If 7PM-7AM, please contact night-coverage at [email or web address], password [facility] 02/07/2014, 6:09 PM  LOS: 3 days

## 2014-02-07 NOTE — Progress Notes (Addendum)
Pt hypertensive this AM. Paged hospitalist for dose of antihypertensive. Pt not complaining of a s/s of HTN. Will continue to monitor.  BP completed manually 20 min later and was lower at 142/70 on the R arm while lying. No need for med.

## 2014-02-08 DIAGNOSIS — B951 Streptococcus, group B, as the cause of diseases classified elsewhere: Secondary | ICD-10-CM

## 2014-02-08 DIAGNOSIS — R7881 Bacteremia: Secondary | ICD-10-CM

## 2014-02-08 DIAGNOSIS — N184 Chronic kidney disease, stage 4 (severe): Secondary | ICD-10-CM

## 2014-02-08 LAB — PROCALCITONIN: Procalcitonin: 2.1 ng/mL

## 2014-02-08 MED ORDER — AMLODIPINE BESYLATE 5 MG PO TABS
5.0000 mg | ORAL_TABLET | Freq: Every day | ORAL | Status: DC
  Administered 2014-02-08 – 2014-02-09 (×2): 5 mg via ORAL
  Filled 2014-02-08 (×2): qty 1

## 2014-02-08 NOTE — Clinical Documentation Improvement (Signed)
Please clarify renal status. Thank you.  . Document the stage of CKD --Chronic kidney disease, stage 1- GFR > OR = 90 --Chronic kidney disease, stage 2 (mild) - GFR 60-89 --Chronic kidney disease, stage 3 (moderate) - GFR 30-59 --Chronic kidney disease, stage 4 (severe) - GFR 15-29 --Chronic kidney disease, stage 5- GFR < 15 --End-stage renal disease (ESRD)  . Document any underlying cause of CKD such as Diabetes or Hypertension  . Chronic renal failure without a documented stage will be assigned to Chronic kidney disease, unspecified  . Document any associated diagnoses/conditions  Supporting Information:  History of CKD and hypertension,   Component     Latest Ref Rng 02/04/2014 02/04/2014 02/05/2014         9:16 PM  9:25 PM   BUN     6 - 23 mg/dL 41 (H) 44 (H) 43 (H)  Creatinine     0.50 - 1.35 mg/dL 2.41 (H) 2.50 (H) 2.51 (H)  Calcium     8.4 - 10.5 mg/dL 9.1  8.4  Total Protein     6.0 - 8.3 g/dL   6.9  Albumin     3.5 - 5.2 g/dL   3.0 (L)  AST     0 - 37 U/L   25  ALT     0 - 53 U/L   12  Alkaline Phosphatase     39 - 117 U/L   49  Total Bilirubin     0.3 - 1.2 mg/dL   0.4  GFR calc non Af Amer     >90 mL/min 22 (L)  21 (L)   Treatments Saline locked IV access Monitor BUN/CR/GFR  Thank You,  Mariska Daffin T. Pricilla Handler, MSN, MBA/MHA Clinical Documentation Specialist [email or web address] Office # [phone]

## 2014-02-08 NOTE — Progress Notes (Addendum)
[facility] PROGRESS NOTE  [name] [name] [id number] DOB: Nov 13, 1921 DOA: 02/04/2014 PCP: Jerlyn Ly, MD  The patient is a 80 y/o with history of Hypertension; Aortic valve stenosis; Gout; bladder cancer; Deep venous thrombosis; Osteoporosis; Renal failure; Heart murmur; Depression; Chronic kidney disease; Kidney stone; Prostate cancer; Bradycardia; S/P ileal conduit; Macular degeneration; Pacemaker; and Hydronephrosis with obstructing calculus. Presented with UTI. Urology evaluated and recommended no surgery or planned procedure at this time.  Assessment/Plan: Infection of urinary tract - sensitivities reporting makes bacterial morphologies. - Group B strep agalactiae growing from one out of 2 blood cultures. Most likely not a contaminant per my discussion with infectious disease. Patient has been improving on Rocephin and given his creatinine of more than 2.0 as well as his age would like to avoid PICC placement. As such we will try placing patient on oral cephalosporin (discussed with ID)  Bacteremia - Group B strep agalactiae, please see plan listed above - discuss with infectious disease - Improving on ceftin  HTN - Not well controlled today - Will restart amlodipine  Active Problems:   Obstructive uropathy -urology on board, CT reports no hydronephrosis  Addendum Pt has CKD stage IV     Code Status: full Family Communication: discussed with son at bedside Disposition Plan: Should blood pressures be better controlled may consider d/c on oral cephalosporin to complete a 14 days total antibiotic course.   Consultants: - Urology  Procedures:  none  Antibiotics:  cefuroxime  HPI/Subjective: The patient has no new complaints. No acute issues overnight  Objective: Filed Vitals:   02/08/14 1542  BP: 171/91  Pulse: 81  Temp: 98.1 F (36.7 C)  Resp: 18    Intake/Output Summary (Last 24 hours) at 02/08/14 1641 Last data filed at 02/08/14 0929  Gross per 24 hour  Intake    240 ml  Output   2350 ml  Net  -2110 ml   Filed Weights   02/04/14 1958 02/05/14 0302  Weight: 78.472 kg (173 lb) 76.567 kg (168 lb 12.8 oz)    Exam:   General:  Patient in no acute distress alert and awake  Cardiovascular: warm extremities, no cyanosis  Respiratory: no increased work of breathing, no audible wheezes, equal chest rise  Abdomen: nondistended, no guarding  Musculoskeletal: no cyanosis on limited exam   Data Reviewed: Basic Metabolic Panel:  Recent Labs Lab 02/04/14 2116 02/04/14 2125 02/05/14 0454  NA 141 141 141  K 5.1 4.8 4.7  CL 104 105 104  CO2 24  --  25  GLUCOSE 125* 123* 145*  BUN 41* 44* 43*  CREATININE 2.41* 2.50* 2.51*  CALCIUM 9.1  --  8.4  MG  --   --  2.1  PHOS  --   --  4.2   Liver Function Tests:  Recent Labs Lab 02/05/14 0454  AST 25  ALT 12  ALKPHOS 49  BILITOT 0.4  PROT 6.9  ALBUMIN 3.0*   No results for input(s): LIPASE, AMYLASE in the last 168 hours. No results for input(s): AMMONIA in the last 168 hours. CBC:  Recent Labs Lab 02/04/14 2116 02/04/14 2125 02/05/14 0454 02/07/14 0500  WBC 17.2*  --  15.3* 9.6  HGB 10.7* 11.2* 10.3* 10.5*  HCT 33.0* 33.0* 32.0* 32.8*  MCV 93.0  --  95.2 94.8  PLT 161  --  128* 163   Cardiac Enzymes: No results for input(s): CKTOTAL, CKMB, CKMBINDEX, TROPONINI in the last 168 hours. BNP (last 3 results) No results  for input(s): PROBNP in the last 8760 hours. CBG: No results for input(s): GLUCAP in the last 168 hours.  Recent Results (from the past 240 hour(s))  Urine culture     Status: None   Collection Time: 02/04/14  9:17 PM  Result Value Ref Range Status   Specimen Description URINE, RANDOM  Final   Special Requests NONE  Final   Culture  Setup Time   Final    02/05/2014 04:22 Performed at [facility]   Final    >=100,000 COLONIES/ML Performed at [facility]    Culture   Final    Multiple  bacterial morphotypes present, none predominant. Suggest appropriate recollection if clinically indicated. Performed at [facility]    Report Status 02/06/2014 FINAL  Final  Culture, blood (routine x 2)     Status: None (Preliminary result)   Collection Time: 02/05/14  1:16 AM  Result Value Ref Range Status   Specimen Description BLOOD LEFT ANTECUBITAL  Final   Special Requests BOTTLES DRAWN AEROBIC AND ANAEROBIC 4CC  Final   Culture  Setup Time   Final    02/05/2014 11:01 Performed at [facility]    Culture   Final           BLOOD CULTURE RECEIVED NO GROWTH TO DATE CULTURE WILL BE HELD FOR 5 DAYS BEFORE ISSUING A FINAL NEGATIVE REPORT Performed at [facility]    Report Status PENDING  Incomplete  Culture, blood (routine x 2)     Status: None   Collection Time: 02/05/14  1:40 AM  Result Value Ref Range Status   Specimen Description BLOOD BLOOD RIGHT FOREARM  Final   Special Requests BOTTLES DRAWN AEROBIC AND ANAEROBIC 5CC  Final   Culture  Setup Time   Final    02/05/2014 11:01 Performed at [facility]    Culture   Final    GROUP B STREP(S.AGALACTIAE)ISOLATED Note: Gram Stain Report Called to,Read Back By and Verified With: JENNY BURNS 02/05/14 @ 7:18PM BY RUSCOE A. Performed at [facility]    Report Status 02/07/2014 FINAL  Final   Organism ID, Bacteria GROUP B STREP(S.AGALACTIAE)ISOLATED  Final      Susceptibility   Group b strep(s.agalactiae)isolated - MIC*    AMPICILLIN <=0.25 SENSITIVE Sensitive     PENICILLIN <=0.06 SENSITIVE Sensitive     CLINDAMYCIN <=0.25 SENSITIVE Sensitive     ERYTHROMYCIN <=0.12 SENSITIVE Sensitive     VANCOMYCIN 0.5 SENSITIVE Sensitive     LEVOFLOXACIN 1 SENSITIVE Sensitive     * GROUP B STREP(S.AGALACTIAE)ISOLATED     Studies: No results found.  Scheduled Meds: . amLODipine  5 mg Oral Daily  . aspirin EC  81 mg Oral Daily  . cefUROXime  250 mg Oral BID WC  . docusate sodium  100 mg Oral BID   . sodium chloride  3 mL Intravenous Q12H   Continuous Infusions:    Time spent: > 35 minutes    Velvet Bathe  [facility] Pager [phone] If 7PM-7AM, please contact night-coverage at [email or web address], password [facility] 02/08/2014, 4:41 PM  LOS: 4 days

## 2014-02-08 NOTE — Plan of Care (Signed)
Problem: Discharge Progression Outcomes Goal: Pain controlled with appropriate interventions Outcome: Completed/Met Date Met:  02/08/14 Goal: Hemodynamically stable Outcome: Progressing High BP today Goal: Complications resolved/controlled Outcome: Progressing Goal: Tolerating diet Outcome: Completed/Met Date Met:  02/08/14 Goal: Activity appropriate for discharge plan Outcome: Completed/Met Date Met:  02/08/14

## 2014-02-08 NOTE — Progress Notes (Addendum)
Pharmacy - Brief Note  Cefuroxime dose for CKD  A/P: - Based on CrCl of 21ml/min, OK to continue cefuroxime 250mg  PO BID as currently ordered - No further adjustment needed  Doreene Eland, PharmD, BCPS.   Pager: [phone] 02/08/2014 1:16 PM

## 2014-02-09 DIAGNOSIS — IMO0001 Reserved for inherently not codable concepts without codable children: Secondary | ICD-10-CM | POA: Insufficient documentation

## 2014-02-09 MED ORDER — CEFUROXIME AXETIL 250 MG PO TABS: 250.0000 mg | ORAL_TABLET | Freq: Two times a day (BID) | ORAL | Status: DC

## 2014-02-09 NOTE — Evaluation (Signed)
Physical Therapy Evaluation Patient Details Name: [name] [name] MRN: [id number] DOB: 1921/07/24 Today's Date: 02/09/2014   History of Present Illness  [name] [name] is a 80 y.o. male has a past medical history of Hypertension; Aortic valve stenosis; Gout; bladder cancer; Deep venous thrombosis; Osteoporosis; Renal failure; Heart murmur; Depression; Chronic kidney disease; Kidney stone; Prostate cancer; Bradycardia; S/P ileal conduit; Macular degeneration; Pacemaker; and Hydronephrosis with obstructing calculus. Patient is sp Right nephrostomy tube originally placed for uretral obstruction likely due to Kidney stone for over 6 months ago he have had urostomy tube replaced 02/03/14.  02/04/14 he developed fever and chills. Family brought him to ER and was found to have UA significant for nitrites and many bacteria.    Clinical Impression  *Pt ambulated 600' without assistive device and no loss of balance. Recommended use of cane at home until he feels he's regained his strength since he felt unsteady with balance challenges. Pt normally is independent with mobility, he is safe to return home from PT standpoint. **    Follow Up Recommendations No PT follow up    Equipment Recommendations  None recommended by PT    Recommendations for Other Services       Precautions / Restrictions Precautions Precautions: Fall Precaution Comments: h/o 1 fall just prior to admission (likely due to illness), otherwise no falls in past year Restrictions Weight Bearing Restrictions: No      Mobility  Bed Mobility Overal bed mobility: Independent                Transfers Overall transfer level: Modified independent                  Ambulation/Gait Ambulation/Gait assistance: Supervision Ambulation Distance (Feet): 600 Feet Assistive device: None Gait Pattern/deviations: Decreased stride length   Gait velocity interpretation: at or above normal speed for age/gender General  Gait Details: pt reports feeling unsteady with head turns while walking, recommended SPC when ambulating at home, at baseline doesn't use AD, no LOB while walking  Stairs            Wheelchair Mobility    Modified Rankin (Stroke Patients Only)       Balance Overall balance assessment: Modified Independent                                           Pertinent Vitals/Pain Pain Assessment: No/denies pain    Home Living Family/patient expects to be discharged to:: Private residence Living Arrangements: Alone Available Help at Discharge: Family;Available PRN/intermittently Type of Home: House Home Access: Stairs to enter Entrance Stairs-Rails: None Entrance Stairs-Number of Steps: 1 Home Layout: Two level;Able to live on main level with bedroom/bathroom Home Equipment: Gilford Rile - 2 wheels;Cane - single point      Prior Function Level of Independence: Independent         Comments: walked 1 mile every day     Hand Dominance        Extremity/Trunk Assessment   Upper Extremity Assessment: Overall WFL for tasks assessed           Lower Extremity Assessment: Overall WFL for tasks assessed      Cervical / Trunk Assessment: Normal  Communication   Communication: No difficulties  Cognition Arousal/Alertness: Awake/alert Behavior During Therapy: WFL for tasks assessed/performed Overall Cognitive Status: Within Functional Limits for tasks assessed  General Comments      Exercises        Assessment/Plan    PT Assessment Patent does not need any further PT services  PT Diagnosis     PT Problem List    PT Treatment Interventions     PT Goals (Current goals can be found in the Care Plan section) Acute Rehab PT Goals Patient Stated Goal: to go home PT Goal Formulation: All assessment and education complete, DC therapy    Frequency     Barriers to discharge        Co-evaluation                End of Session Equipment Utilized During Treatment: Gait belt Activity Tolerance: Patient tolerated treatment well Patient left: in chair;with call bell/phone within reach Nurse Communication: Mobility status         Time: [id number] PT Time Calculation (min) (ACUTE ONLY): 27 min   Charges:   PT Evaluation $Initial PT Evaluation Tier I: 1 Procedure PT Treatments $Gait Training: 8-22 mins   PT G Codes:          Philomena Doheny 02/09/2014, 2:36 PM [phone]

## 2014-02-09 NOTE — Discharge Instructions (Signed)
Bacteremia Bacteremia occurs when bacteria get in your blood. Normal blood does not usually have bacteria. Bacteremia is one way infections can spread from one part of the body to another. CAUSES   Causes may include anything that allows bacteria to get into the body. Examples are:  Catheters.  Intravenous (IV) access tubes.  Cuts or scrapes of the skin.  Temporary bacteremia may occur during dental procedures, while brushing your teeth, or during a bowel movement. This rarely causes any symptoms or medical problems.  Bacteria may also get in the bloodstream as a complication of a bacterial infection elsewhere. This includes infected wounds and bacterial infections of the:  Lungs (pneumonia).  Kidneys (pyelonephritis).  Intestines (enteritis, colitis).  Organs in the abdomen (appendicitis, cholecystitis, diverticulitis). SYMPTOMS  The body is usually able to clear small numbers of bacteria out of the blood quickly. Brief bacteremia usually does not cause problems.   Problems can occur if the bacteria start to grow in number or spread to other parts of the body. If the bacteria start growing, you may develop:  Chills.  Fever.  Nausea.  Vomiting.  Sweating.  Lightheadedness and low blood pressure.  Pain.  If bacteria start to grow in the linings around the brain, it is called meningitis. This can cause severe headaches, many other problems, and even death.  If bacteria start to grow in a joint, it causes arthritis with painful joints. If bacteria start to grow in a bone, it is called osteomyelitis.  Bacteria from the blood can also cause sores (abscesses) in many organs, such as the muscle, liver, spleen, lungs, brain, and kidneys. DIAGNOSIS   This condition is diagnosed by cultures of the blood.  Cultures may also be taken from other parts of the body that are thought to be causing the bacteremia. A small piece of tissue, fluid, or other product of the body is  sampled. The sample is then put on a growth plate to see if any bacteria grows.  Other lab tests may be done and the results may be abnormal. TREATMENT  Treatment requires a stay in the hospital. You will be given antibiotic medicine through an IV access tube. PREVENTION  People with an increased risk of developing bacteremia or complications may be given antibiotics before certain procedures. Examples are:  A person with a heart murmur or artificial heart valve, before having his or her teeth cleaned.  Before having a surgical or other invasive procedure.  Before having a bowel procedure. Document Released: 12/23/2005 Document Revised: 06/03/2011 Document Reviewed: 10/04/2010 [facility] Patient Information 2015 [place], [place]. This information is not intended to replace advice given to you by your health care provider. Make sure you discuss any questions you have with your health care provider. Urinary Tract Infection Urinary tract infections (UTIs) can develop anywhere along your urinary tract. Your urinary tract is your body's drainage system for removing wastes and extra water. Your urinary tract includes two kidneys, two ureters, a bladder, and a urethra. Your kidneys are a pair of bean-shaped organs. Each kidney is about the size of your fist. They are located below your ribs, one on each side of your spine. CAUSES Infections are caused by microbes, which are microscopic organisms, including fungi, viruses, and bacteria. These organisms are so small that they can only be seen through a microscope. Bacteria are the microbes that most commonly cause UTIs. SYMPTOMS  Symptoms of UTIs may vary by age and gender of the patient and by the location of  the infection. Symptoms in young women typically include a frequent and intense urge to urinate and a painful, burning feeling in the bladder or urethra during urination. Older women and men are more likely to be tired, shaky, and weak and have muscle  aches and abdominal pain. A fever may mean the infection is in your kidneys. Other symptoms of a kidney infection include pain in your back or sides below the ribs, nausea, and vomiting. DIAGNOSIS To diagnose a UTI, your caregiver will ask you about your symptoms. Your caregiver also will ask to provide a urine sample. The urine sample will be tested for bacteria and white blood cells. White blood cells are made by your body to help fight infection. TREATMENT  Typically, UTIs can be treated with medication. Because most UTIs are caused by a bacterial infection, they usually can be treated with the use of antibiotics. The choice of antibiotic and length of treatment depend on your symptoms and the type of bacteria causing your infection. HOME CARE INSTRUCTIONS  If you were prescribed antibiotics, take them exactly as your caregiver instructs you. Finish the medication even if you feel better after you have only taken some of the medication.  Drink enough water and fluids to keep your urine clear or pale yellow.  Avoid caffeine, tea, and carbonated beverages. They tend to irritate your bladder.  Empty your bladder often. Avoid holding urine for long periods of time.  Empty your bladder before and after sexual intercourse.  After a bowel movement, women should cleanse from front to back. Use each tissue only once. SEEK MEDICAL CARE IF:   You have back pain.  You develop a fever.  Your symptoms do not begin to resolve within 3 days. SEEK IMMEDIATE MEDICAL CARE IF:   You have severe back pain or lower abdominal pain.  You develop chills.  You have nausea or vomiting.  You have continued burning or discomfort with urination. MAKE SURE YOU:   Understand these instructions.  Will watch your condition.  Will get help right away if you are not doing well or get worse. Document Released: 12/19/2004 Document Revised: 09/10/2011 Document Reviewed: 04/19/2011 [facility] Patient Information  2015 [place], [place]. This information is not intended to replace advice given to you by your health care provider. Make sure you discuss any questions you have with your health care provider.

## 2014-02-09 NOTE — Discharge Summary (Signed)
Physician Discharge Summary  [name] [name] [id number] DOB: 1922/01/01 DOA: 02/04/2014  PCP: Jerlyn Ly, MD  Admit date: 02/04/2014 Discharge date: 02/09/2014  Time spent: 45 minutes  Recommendations for Outpatient Follow-up:  Patient will be discharged to home. He is to followup with his primary care physician within one week of discharge.  Patient is to continue his medications as prescribed.patient should follow a heart healthy diet. Patient may resume normal activity as tolerated.  Discharge Diagnoses:  Sepsis secondary to UTI/bacteremia Hypertension Obstructive uropathy CKD, stage IV  Discharge Condition: stable  Diet recommendation: heart healthy  Filed Weights   02/04/14 1958 02/05/14 0302  Weight: 78.472 kg (173 lb) 76.567 kg (168 lb 12.8 oz)    History of present illness:  On 02/04/2014 This is a 80 year old male with a history of hypertension, aortic valve stenosis, gout, bladder cancer, renal failure, that presented to the emergency department with complaints of fever. Patient is status post right nephrostomy tube which was originally placed for uretral obstruction secondary to kidney stone for over 6 months. After ostomy tube was placed, patient did develop fever and chills, and was brought to the emergency department by his family. His UA was significant for nitrates as well as many bacteria. Patient was noted to be febrile with temperature 103.  In the emergency department, patient did have evidence of sleep apnea with hypoxia which improved with 2 L nasal cannula. Hospitalist was called for admission.  Hospital Course:  Sepsis secondary to UTI/bacteremia -Upon admission, patient was noted to have leukocytosis and was febrile. -UA was positive for infection, urine culture showed greater than 100,000 colonies with multiple bacterial morphotypes present -Blood cultures: 1/2 was positive for group B strep/agalactiae -previous hospitalist spoke with ID, who  recommended oral cephalosporin for a total of 14 days. -Will continue patient on Ceftin.  Hypertension -Appears stable. Continue amlodipine  Obstructive uropathy -Patient will need to follow up with urology, Dr. Jeffie Pollock  CKD, stage IV -Creatinine appears to be stable.    Procedures: none  Consultations: Urology Infectious disease, via phone, by previous hospitalist  Discharge Exam: Filed Vitals:   02/09/14 1354  BP: 133/71  Pulse: 75  Temp: 97.5 F (36.4 C)  Resp: 16     General: Well developed, well nourished, NAD, appears younger than stated age  HEENT: NCAT,  mucous membranes moist.  Cardiovascular: S1 S2 auscultated, no rubs, murmurs or gallops. Regular rate and rhythm.  Respiratory: Clear to auscultation bilaterally with equal chest rise  Abdomen: Soft, nontender, nondistended, + bowel sounds  Extremities: warm dry without cyanosis clubbing or edema  Neuro: AAOx3, cranial nerves grossly intact. Strength 5/5 in patient's upper and lower extremities bilaterally  Psych: Normal affect and demeanor with intact judgement and insight  Discharge Instructions      Discharge Instructions    Discharge instructions    Complete by:  As directed   Patient will be discharged to home. He is to followup with his primary care physician within one week of discharge.  Patient is to continue his medications as prescribed.patient should follow a heart healthy diet. Patient may resume normal activity as tolerated.            Medication List    TAKE these medications        acetaminophen 500 MG tablet  Commonly known as:  TYLENOL  Take 1,000 mg by mouth every 8 (eight) hours as needed for mild pain or headache.     amLODipine 5 MG tablet  Commonly known as:  NORVASC  Take 5 mg by mouth every morning.     aspirin EC 81 MG tablet  Take 81 mg by mouth every morning.     cefUROXime 250 MG tablet  Commonly known as:  CEFTIN  Take 1 tablet (250 mg total) by mouth 2  (two) times daily with a meal.     CLEAR EYES COMPLETE OP  Apply 1-2 drops to eye daily as needed (dry eyes).     docusate sodium 100 MG capsule  Commonly known as:  COLACE  Take 100 mg by mouth daily.     OMEGA-3 KRILL OIL PO  Take 1 tablet by mouth every morning.     PRESERVISION AREDS 2 PO  Take 1 tablet by mouth 2 (two) times daily.     Vitamin D-3 1000 UNITS Caps  Take 1 capsule by mouth every morning.       No Known Allergies Follow-up Information    Follow up with PERINI,MARK A, MD. Schedule an appointment as soon as possible for a visit in 1 week.   Specialty:  Internal Medicine   Why:  hospital follow-up   Contact information:   [place] [address] [phone]       Follow up with Malka So, MD. Schedule an appointment as soon as possible for a visit in 1 week.   Specialty:  Urology   Why:  hospital follow-up   Contact information:   [address] [place] [address] [phone]        The results of significant diagnostics from this hospitalization (including imaging, microbiology, ancillary and laboratory) are listed below for reference.    Significant Diagnostic Studies: Ir Catheter Tube Change  02/03/2014   CLINICAL DATA:  80 year old gentleman with a history of bladder cancer. He has had cystectomy and has a urostomy. He presents for routine exchange of a right nephro ureteral tube.  A previous attempt at exchange through the urostomy failed, with loss of access. The result was required access via antegrade percutaneous nephrostomy.  EXAM: FLUOROSCOPIC GUIDED EXCHANGE OF RIGHT NEPHRO URETEROSTOMY TUBE VIA THE PATIENT'S UROSTOMY.  FLUOROSCOPY TIME:  7 min, 42 seconds  TECHNIQUE: The procedure risks, benefits, and alternatives were explained to the patient. Questions regarding the procedure were encouraged and answered. The patient understands and consents to the procedure.  Small amount of contrast confirmed position of the  indwelling tube within the right collecting system.  A standard Glidewire was advanced through the indwelling tube after ligating the tube. The Glidewire was used to cannulate the encrusted tip of the indwelling tube. The Glidewire was successful, though there was some friction on removing the tube. A sterile clamp was used to maintain the Glidewire access on the pin-pull removal of the catheter.  Once the catheter was removed, a 4 French straight tip glide cath was advanced to the collecting system of the right kidney. The standard Glidewire was then removed.  An Amplatz wire was then advanced through the glide cath to the collecting system after gently looping the end of the wire for safety purposes. This also increase the length of stiff wire within the tract.  The glide cath was removed from the Amplatz wire, and the Amplatz wire was used as the rail for placement of the new 45 cm 12 French pigtail catheter.  The pigtail catheter was formed in the collecting system and contrast was used to confirm position.  No complications were encountered. Although no significant  blood loss was encountered, the patient did have some blood-tinged urine.  Patient tolerated the procedure well and remained hemodynamically stable throughout.  COMPLICATIONS: None  FINDINGS: Initial images demonstrate indwelling nephrostomy tube in the collecting system of the right kidney.  Images during the case demonstrate fluoroscopically guided exchange of 45 cm 12 French pigtail nephro ureterostomy.  The patient has tortuous surgical anatomy, with redundancy of the urostomy and a low implantation of the ureter.  IMPRESSION: Status post exchange of patient's indwelling right-sided nephro ureteral tube via the patient's urostomy.  The patient has significant tortuosity of his surgical anatomy, making the exchange difficult. Successful steps detailed above.  Signed,  Dulcy Fanny. Earleen Newport, DO  [facility]   [facility]   Electronically Signed   By: Corrie Mckusick D.O.   On: 02/03/2014 13:25   Dg Chest Port 1 View  02/04/2014   CLINICAL DATA:  c/o of near syncope pt states "got swimmy headed", lowered himself to the ground, EMS report he was mildly pale, family remark on slight slurred speech after the incidence, c/o of vomiting as well, of note patient had his Urostomy tune replaced yesterday, denies any problems with that. Pt denies any pain at this time, in NAD.  EXAM: PORTABLE CHEST - 1 VIEW  COMPARISON:  07/29/2013  FINDINGS: Pacer with leads at right atrium and right ventricle. No lead discontinuity. Midline trachea. Mild cardiomegaly. Mediastinal contours otherwise within normal limits. No pleural effusion or pneumothorax. No congestive failure. Clear lungs.  IMPRESSION: Mild cardiomegaly, without acute disease.   Electronically Signed   By: Abigail Miyamoto M.D.   On: 02/04/2014 20:39   Ct Renal Stone Study  02/05/2014   CLINICAL DATA:  Fever, elevated WBC, status post ureteral stent exchange, evaluate for left side kidney stones/obstruction, status post cystectomy and ileal conduit, bladder cancer, appendix  EXAM: CT ABDOMEN AND PELVIS WITHOUT CONTRAST  TECHNIQUE: Multidetector CT imaging of the abdomen and pelvis was performed following the standard protocol without IV contrast.  COMPARISON:  07/28/2013  FINDINGS: Lung bases are unremarkable. Sagittal images of the spine shows osteopenia and degenerative changes thoracolumbar spine.  Partially visualized cardiac pacemaker leads. Unenhanced liver is unremarkable. Multiple calcified gallstones are noted in dependent gallbladder largest measures 9 mm. Unenhanced pancreas, spleen and adrenal glands are unremarkable. Again noted status post cystectomy and prostatectomy with ileal conduit in right lower quadrant. There is a right ureteral stent exiting ileal conduit in right lower quadrant. No right hydronephrosis. Exophytic cyst in posterior aspect  midpole of the right kidney measures 3.5 cm on the prior exam measures 4.3 cm.  No left hydronephrosis is noted. There is a cyst in midpole of the left kidney medial aspect measures 1.5 cm. Nonobstructive calculus in lower pole of the left kidney measures 6.4 mm. No ureteral calculi are noted bilaterally.  No small bowel obstruction. There is a small lower midline pelvic wall ventral hernia containing fat and small bowel loop without evidence of acute complication. A left inguinal hernia containing fat measures 3.9 cm. No evidence of acute complication. Moderate distension of the rectum with gas. Mild gaseous distension of sigmoid colon.  IMPRESSION: 1. There is a right ureteral stent in place. The stent is exiting in right lower quadrant through the ileal conduit. No hydronephrosis. Again noted postsurgical changes post cystectomy and prostatectomy. 2. There is a cyst in midpole of the right kidney posteriorly measures 3.5 cm decreased in size from prior exam. Left nonobstructive nephrolithiasis. No left  ureteral calculi are noted. No left hydronephrosis. 3. Calcified gallstones are noted within gallbladder the largest measures 9 mm. 4. No small bowel obstruction. Moderate gaseous distension of sigmoid colon. There is gas in the rectum. There is left inguinal hernia containing fat without evidence of acute complication.   Electronically Signed   By: Lahoma Crocker M.D.   On: 02/05/2014 15:30    Microbiology: Recent Results (from the past 240 hour(s))  Urine culture     Status: None   Collection Time: 02/04/14  9:17 PM  Result Value Ref Range Status   Specimen Description URINE, RANDOM  Final   Special Requests NONE  Final   Culture  Setup Time   Final    02/05/2014 04:22 Performed at [facility]   Final    >=100,000 COLONIES/ML Performed at [facility]    Culture   Final    Multiple bacterial morphotypes present, none predominant. Suggest appropriate recollection if  clinically indicated. Performed at [facility]    Report Status 02/06/2014 FINAL  Final  Culture, blood (routine x 2)     Status: None (Preliminary result)   Collection Time: 02/05/14  1:16 AM  Result Value Ref Range Status   Specimen Description BLOOD LEFT ANTECUBITAL  Final   Special Requests BOTTLES DRAWN AEROBIC AND ANAEROBIC 4CC  Final   Culture  Setup Time   Final    02/05/2014 11:01 Performed at [facility]    Culture   Final           BLOOD CULTURE RECEIVED NO GROWTH TO DATE CULTURE WILL BE HELD FOR 5 DAYS BEFORE ISSUING A FINAL NEGATIVE REPORT Performed at [facility]    Report Status PENDING  Incomplete  Culture, blood (routine x 2)     Status: None   Collection Time: 02/05/14  1:40 AM  Result Value Ref Range Status   Specimen Description BLOOD BLOOD RIGHT FOREARM  Final   Special Requests BOTTLES DRAWN AEROBIC AND ANAEROBIC 5CC  Final   Culture  Setup Time   Final    02/05/2014 11:01 Performed at [facility]    Culture   Final    GROUP B STREP(S.AGALACTIAE)ISOLATED Note: Gram Stain Report Called to,Read Back By and Verified With: JENNY BURNS 02/05/14 @ 7:18PM BY RUSCOE A. Performed at [facility]    Report Status 02/07/2014 FINAL  Final   Organism ID, Bacteria GROUP B STREP(S.AGALACTIAE)ISOLATED  Final      Susceptibility   Group b strep(s.agalactiae)isolated - MIC*    AMPICILLIN <=0.25 SENSITIVE Sensitive     PENICILLIN <=0.06 SENSITIVE Sensitive     CLINDAMYCIN <=0.25 SENSITIVE Sensitive     ERYTHROMYCIN <=0.12 SENSITIVE Sensitive     VANCOMYCIN 0.5 SENSITIVE Sensitive     LEVOFLOXACIN 1 SENSITIVE Sensitive     * GROUP B STREP(S.AGALACTIAE)ISOLATED     Labs: Basic Metabolic Panel:  Recent Labs Lab 02/04/14 2116 02/04/14 2125 02/05/14 0454  NA 141 141 141  K 5.1 4.8 4.7  CL 104 105 104  CO2 24  --  25  GLUCOSE 125* 123* 145*  BUN 41* 44* 43*  CREATININE 2.41* 2.50* 2.51*  CALCIUM 9.1  --  8.4  MG  --    --  2.1  PHOS  --   --  4.2   Liver Function Tests:  Recent Labs Lab 02/05/14 0454  AST 25  ALT 12  ALKPHOS 49  BILITOT 0.4  PROT 6.9  ALBUMIN 3.0*   No results for input(s): LIPASE, AMYLASE in the last 168 hours. No results for input(s): AMMONIA in the last 168 hours. CBC:  Recent Labs Lab 02/04/14 2116 02/04/14 2125 02/05/14 0454 02/07/14 0500  WBC 17.2*  --  15.3* 9.6  HGB 10.7* 11.2* 10.3* 10.5*  HCT 33.0* 33.0* 32.0* 32.8*  MCV 93.0  --  95.2 94.8  PLT 161  --  128* 163   Cardiac Enzymes: No results for input(s): CKTOTAL, CKMB, CKMBINDEX, TROPONINI in the last 168 hours. BNP: BNP (last 3 results) No results for input(s): PROBNP in the last 8760 hours. CBG: No results for input(s): GLUCAP in the last 168 hours.     SignedCristal Ford  [facility] 02/09/2014, 3:37 PM

## 2014-02-10 ENCOUNTER — Other Ambulatory Visit (HOSPITAL_COMMUNITY): Payer: Medicare Other

## 2014-02-11 LAB — CULTURE, BLOOD (ROUTINE X 2): Culture: NO GROWTH

## 2014-03-03 ENCOUNTER — Other Ambulatory Visit (HOSPITAL_COMMUNITY): Payer: Medicare Other

## 2014-03-09 ENCOUNTER — Ambulatory Visit (HOSPITAL_COMMUNITY)
Admission: RE | Admit: 2014-03-09 | Discharge: 2014-03-09 | Disposition: A | Discharge status: Home / Self Care | Payer: Medicare Other | Source: Ambulatory Visit | Attending: Urology | Admitting: Urology

## 2014-03-09 ENCOUNTER — Other Ambulatory Visit: Payer: Self-pay | Admitting: Urology

## 2014-03-09 DIAGNOSIS — N135 Crossing vessel and stricture of ureter without hydronephrosis: Secondary | ICD-10-CM

## 2014-03-09 DIAGNOSIS — Z436 Encounter for attention to other artificial openings of urinary tract: Secondary | ICD-10-CM | POA: Insufficient documentation

## 2014-03-09 MED ORDER — CIPROFLOXACIN IN D5W 400 MG/200ML IV SOLN
400.0000 mg | Freq: Once | INTRAVENOUS | Status: AC
  Administered 2014-03-09: 400 mg via INTRAVENOUS

## 2014-03-09 MED ORDER — CIPROFLOXACIN IN D5W 400 MG/200ML IV SOLN
INTRAVENOUS | Status: AC
  Filled 2014-03-09: qty 200

## 2014-03-09 MED ORDER — IOHEXOL 300 MG/ML  SOLN
10.0000 mL | Freq: Once | INTRAMUSCULAR | Status: AC | PRN
  Administered 2014-03-09: 15 mL

## 2014-04-14 ENCOUNTER — Other Ambulatory Visit: Payer: Self-pay | Admitting: Urology

## 2014-04-14 ENCOUNTER — Ambulatory Visit (HOSPITAL_COMMUNITY)
Admission: RE | Admit: 2014-04-14 | Discharge: 2014-04-14 | Disposition: A | Discharge status: Home / Self Care | Payer: Medicare Other | Source: Ambulatory Visit | Attending: Urology | Admitting: Urology

## 2014-04-14 ENCOUNTER — Encounter (HOSPITAL_COMMUNITY): Payer: Self-pay | Admitting: Anesthesiology

## 2014-04-14 DIAGNOSIS — N135 Crossing vessel and stricture of ureter without hydronephrosis: Secondary | ICD-10-CM

## 2014-04-14 DIAGNOSIS — C679 Malignant neoplasm of bladder, unspecified: Secondary | ICD-10-CM | POA: Insufficient documentation

## 2014-04-14 MED ORDER — IOHEXOL 300 MG/ML  SOLN
10.0000 mL | Freq: Once | INTRAMUSCULAR | Status: AC | PRN
  Administered 2014-04-14: 10 mL

## 2014-04-14 NOTE — Procedures (Signed)
Procedure:  Exchange of right nephroureteral catheter via ostomy New 12 Fr catheter advanced over wire and formed in upper pole collecting system.

## 2014-05-12 ENCOUNTER — Other Ambulatory Visit: Payer: Self-pay | Admitting: Urology

## 2014-05-12 ENCOUNTER — Ambulatory Visit (HOSPITAL_COMMUNITY)
Admission: RE | Admit: 2014-05-12 | Discharge: 2014-05-12 | Disposition: A | Discharge status: Home / Self Care | Payer: Medicare Other | Source: Ambulatory Visit | Attending: Urology | Admitting: Urology

## 2014-05-12 DIAGNOSIS — N135 Crossing vessel and stricture of ureter without hydronephrosis: Secondary | ICD-10-CM

## 2014-05-12 DIAGNOSIS — Z7689 Persons encountering health services in other specified circumstances: Secondary | ICD-10-CM | POA: Insufficient documentation

## 2014-05-12 MED ORDER — IOHEXOL 300 MG/ML  SOLN
10.0000 mL | Freq: Once | INTRAMUSCULAR | Status: AC | PRN
  Administered 2014-05-12: 10 mL

## 2014-05-16 ENCOUNTER — Ambulatory Visit (INDEPENDENT_AMBULATORY_CARE_PROVIDER_SITE_OTHER): Payer: Medicare Other | Admitting: Ophthalmology

## 2014-05-16 DIAGNOSIS — H35033 Hypertensive retinopathy, bilateral: Secondary | ICD-10-CM | POA: Diagnosis not present

## 2014-05-16 DIAGNOSIS — H3531 Nonexudative age-related macular degeneration: Secondary | ICD-10-CM | POA: Diagnosis not present

## 2014-05-16 DIAGNOSIS — H43813 Vitreous degeneration, bilateral: Secondary | ICD-10-CM

## 2014-05-16 DIAGNOSIS — I10 Essential (primary) hypertension: Secondary | ICD-10-CM

## 2014-06-09 ENCOUNTER — Other Ambulatory Visit: Payer: Self-pay | Admitting: Urology

## 2014-06-09 ENCOUNTER — Ambulatory Visit (HOSPITAL_COMMUNITY)
Admission: RE | Admit: 2014-06-09 | Discharge: 2014-06-09 | Disposition: A | Discharge status: Home / Self Care | Payer: Medicare Other | Source: Ambulatory Visit | Attending: Interventional Radiology | Admitting: Interventional Radiology

## 2014-06-09 DIAGNOSIS — Z436 Encounter for attention to other artificial openings of urinary tract: Secondary | ICD-10-CM | POA: Diagnosis present

## 2014-06-09 DIAGNOSIS — C679 Malignant neoplasm of bladder, unspecified: Secondary | ICD-10-CM | POA: Insufficient documentation

## 2014-06-09 DIAGNOSIS — Z906 Acquired absence of other parts of urinary tract: Secondary | ICD-10-CM | POA: Insufficient documentation

## 2014-06-09 DIAGNOSIS — Z9079 Acquired absence of other genital organ(s): Secondary | ICD-10-CM | POA: Insufficient documentation

## 2014-06-09 DIAGNOSIS — N135 Crossing vessel and stricture of ureter without hydronephrosis: Secondary | ICD-10-CM

## 2014-06-09 MED ORDER — IOHEXOL 300 MG/ML  SOLN
50.0000 mL | Freq: Once | INTRAMUSCULAR | Status: AC | PRN
Start: 2014-06-09 — End: 2014-06-09
  Administered 2014-06-09: 10 mL

## 2014-07-07 ENCOUNTER — Ambulatory Visit (HOSPITAL_COMMUNITY)
Admission: RE | Admit: 2014-07-07 | Discharge: 2014-07-07 | Disposition: A | Discharge status: Home / Self Care | Payer: Medicare Other | Source: Ambulatory Visit | Attending: Interventional Radiology | Admitting: Interventional Radiology

## 2014-07-07 ENCOUNTER — Other Ambulatory Visit: Payer: Self-pay | Admitting: Urology

## 2014-07-07 DIAGNOSIS — N135 Crossing vessel and stricture of ureter without hydronephrosis: Secondary | ICD-10-CM

## 2014-07-07 DIAGNOSIS — Z906 Acquired absence of other parts of urinary tract: Secondary | ICD-10-CM | POA: Diagnosis not present

## 2014-07-07 DIAGNOSIS — Z9079 Acquired absence of other genital organ(s): Secondary | ICD-10-CM | POA: Insufficient documentation

## 2014-07-07 DIAGNOSIS — Z436 Encounter for attention to other artificial openings of urinary tract: Secondary | ICD-10-CM | POA: Insufficient documentation

## 2014-07-07 DIAGNOSIS — Z8551 Personal history of malignant neoplasm of bladder: Secondary | ICD-10-CM | POA: Insufficient documentation

## 2014-07-07 MED ORDER — IOHEXOL 300 MG/ML  SOLN
10.0000 mL | Freq: Once | INTRAMUSCULAR | Status: AC | PRN
  Administered 2014-07-07: 10 mL

## 2014-07-07 NOTE — Procedures (Signed)
Successful right sided retrograde PCN exchange via ileal conduit.  No immediate complications.

## 2014-08-04 ENCOUNTER — Ambulatory Visit (HOSPITAL_COMMUNITY)
Admission: RE | Admit: 2014-08-04 | Discharge: 2014-08-04 | Disposition: A | Discharge status: Home / Self Care | Payer: Medicare Other | Source: Ambulatory Visit | Attending: Urology | Admitting: Urology

## 2014-08-04 ENCOUNTER — Other Ambulatory Visit: Payer: Self-pay | Admitting: Urology

## 2014-08-04 DIAGNOSIS — N135 Crossing vessel and stricture of ureter without hydronephrosis: Secondary | ICD-10-CM

## 2014-08-04 DIAGNOSIS — Z436 Encounter for attention to other artificial openings of urinary tract: Secondary | ICD-10-CM | POA: Insufficient documentation

## 2014-08-04 MED ORDER — IOHEXOL 300 MG/ML  SOLN
50.0000 mL | Freq: Once | INTRAMUSCULAR | Status: AC | PRN
  Administered 2014-08-04: 10 mL

## 2014-08-05 ENCOUNTER — Telehealth (HOSPITAL_COMMUNITY): Payer: Self-pay | Admitting: Radiology

## 2014-08-05 NOTE — Telephone Encounter (Signed)
Rec'd call from Audry Pili (pt son) to reschedule patient's next pcn exchange from Wednesday June 8 to Thursday June 9.  Patient's family prefers Thursday appointments.  I explained to Audry Pili that we would not have Dr. Barbie Banner on Thursday.  Appointment has been rescheduled to Thursday June 9, arrive at 1:00 for 1:30 appointment.  [name] aware.

## 2014-08-31 ENCOUNTER — Other Ambulatory Visit (HOSPITAL_COMMUNITY): Payer: Medicare Other

## 2014-09-01 ENCOUNTER — Ambulatory Visit (HOSPITAL_COMMUNITY)
Admission: RE | Admit: 2014-09-01 | Discharge: 2014-09-01 | Disposition: A | Discharge status: Home / Self Care | Payer: Medicare Other | Source: Ambulatory Visit | Attending: Urology | Admitting: Urology

## 2014-09-01 ENCOUNTER — Other Ambulatory Visit: Payer: Self-pay | Admitting: Urology

## 2014-09-01 ENCOUNTER — Other Ambulatory Visit (HOSPITAL_COMMUNITY): Payer: Medicare Other

## 2014-09-01 DIAGNOSIS — C679 Malignant neoplasm of bladder, unspecified: Secondary | ICD-10-CM | POA: Diagnosis not present

## 2014-09-01 DIAGNOSIS — N135 Crossing vessel and stricture of ureter without hydronephrosis: Secondary | ICD-10-CM

## 2014-09-01 DIAGNOSIS — Z466 Encounter for fitting and adjustment of urinary device: Secondary | ICD-10-CM | POA: Diagnosis present

## 2014-09-01 MED ORDER — IOHEXOL 300 MG/ML  SOLN
10.0000 mL | Freq: Once | INTRAMUSCULAR | Status: AC | PRN
  Administered 2014-09-01: 10 mL

## 2014-09-01 NOTE — Procedures (Signed)
Exchange 43F nephroureteral No complication No blood loss. See complete dictation in [facility].

## 2014-09-29 ENCOUNTER — Other Ambulatory Visit: Payer: Self-pay | Admitting: Urology

## 2014-09-29 ENCOUNTER — Ambulatory Visit (HOSPITAL_COMMUNITY)
Admission: RE | Admit: 2014-09-29 | Discharge: 2014-09-29 | Disposition: A | Discharge status: Home / Self Care | Payer: Medicare Other | Source: Ambulatory Visit | Attending: Urology | Admitting: Urology

## 2014-09-29 DIAGNOSIS — Z466 Encounter for fitting and adjustment of urinary device: Secondary | ICD-10-CM | POA: Insufficient documentation

## 2014-09-29 DIAGNOSIS — N135 Crossing vessel and stricture of ureter without hydronephrosis: Secondary | ICD-10-CM

## 2014-09-29 MED ORDER — IOHEXOL 300 MG/ML  SOLN
5.0000 mL | Freq: Once | INTRAMUSCULAR | Status: AC | PRN
  Administered 2014-09-29: 5 mL via INTRAVENOUS

## 2014-09-29 NOTE — Procedures (Signed)
Successful RT nephroureteral cath exchg 12 fr No comp Stable Full report in pacs

## 2014-10-05 ENCOUNTER — Encounter: Payer: Self-pay | Admitting: Internal Medicine

## 2014-10-05 ENCOUNTER — Encounter: Payer: Self-pay | Admitting: Nurse Practitioner

## 2014-10-05 ENCOUNTER — Ambulatory Visit (INDEPENDENT_AMBULATORY_CARE_PROVIDER_SITE_OTHER): Payer: Medicare Other | Admitting: Nurse Practitioner

## 2014-10-05 VITALS — BP 136/72 | HR 88 | Ht 68.0 in | Wt 168.0 lb

## 2014-10-05 DIAGNOSIS — I10 Essential (primary) hypertension: Secondary | ICD-10-CM | POA: Diagnosis not present

## 2014-10-05 DIAGNOSIS — R001 Bradycardia, unspecified: Secondary | ICD-10-CM | POA: Diagnosis not present

## 2014-10-05 LAB — CUP PACEART INCLINIC DEVICE CHECK
Date Time Interrogation Session: 20160713154947
Lead Channel Setting Pacing Amplitude: 2.5 V
Lead Channel Setting Pacing Pulse Width: 0.4 ms
Lead Channel Setting Sensing Sensitivity: 2 mV
MDC IDC SET LEADCHNL RA PACING AMPLITUDE: 2 V

## 2014-10-05 NOTE — Patient Instructions (Signed)
Medication Instructions:   Your physician recommends that you continue on your current medications as directed. Please refer to the Current Medication list given to you today.    Labwork:   Testing/Procedures:   Follow-Up:  [place] will receive a reminder letter in the mail two months in advance. If you don't receive a letter, please call our office to schedule the follow-up appointment.    Your physician wants you to follow-up in:  [place] will receive a reminder letter in the mail two months in advance. If you don't receive a letter, please call our office to schedule the follow-up appointment.   Any Other Special Instructions Will Be Listed Below (If Applicable).

## 2014-10-05 NOTE — Progress Notes (Signed)
Electrophysiology Office Note Date: 10/05/2014  ID:  [name] [name], [name] [name] [birth year], MRN [id number]  PCP: Jerlyn Ly, MD Electrophysiologist: Lovena Le  CC: Pacemaker follow-up  [name] [name] is a 81 y.o. male seen today for Dr Lovena Le.  Since being seen in the office last year, he was admitted 01/2014 with urosepsis.  He presents today for routine electrophysiology followup.  Since last being seen in our clinic, the patient reports doing very well.  He denies chest pain, palpitations, dyspnea, PND, orthopnea, nausea, vomiting, dizziness, syncope, edema, weight gain, or early satiety.  He is relatively inactive at home, but has no functional limitations around the house.   Device History: MDT dual chamber PPM implanted 2009 for symptomatic bradycardia   Past Medical History  Diagnosis Date  . Hypertension   . Aortic valve stenosis     a. mild to moderate by echo 2013  . Gout   . Deep venous thrombosis     hx of  LEFT ARM AFTER PACEMAKER INSERTION ABOUT 6 YRS AGO  . Osteoporosis   . Depression   . Chronic kidney disease   . Kidney stone   . Prostate cancer     AND BLADDER CANCER - S/P URETEROILEAL CONDUIT  . Bradycardia     MDT dual chamber pacemaker  . S/P ileal conduit        . Macular degeneration     PT STATES HIS VISION STILL OKAY FOR DRIVING   Past Surgical History  Procedure Laterality Date  . Pacemaker insertion  2009    MDT dual chamber pacemaker implanted by Dr Verlon Setting  . Cystectomy    . Lymphadenectomy    . Prostate surgery    . Appendectomy    . Cataract extraction, bilateral    . Kidney stone surgery    . Ilial conduit      for bladder cancer  . Nephrolithotomy  09/02/2011    Procedure: NEPHROLITHOTOMY PERCUTANEOUS;  Surgeon: Malka So, MD;  Location: [facility];  Service: Urology;  Laterality: Left;  . 07/20/13  conversion of right sided nephrostomy catheter to right sided nephro ureteral catheter - done in interventional radiology    .  Nephrolithotomy Right 07/27/2013    Procedure: RIGHT PERCUTANEOUS NEPHROLITHOTOMY ;  Surgeon: Irine Seal, MD;  Location: [facility];  Service: Urology;  Laterality: Right;    Current Outpatient Prescriptions  Medication Sig Dispense Refill  . acetaminophen (TYLENOL) 500 MG tablet Take 1,000 mg by mouth every 8 (eight) hours as needed for mild pain or headache.    Marland Kitchen amLODipine (NORVASC) 5 MG tablet Take 5 mg by mouth every morning.     Marland Kitchen aspirin EC 81 MG tablet Take 81 mg by mouth every morning.    . cefUROXime (CEFTIN) 250 MG tablet Take 1 tablet (250 mg total) by mouth 2 (two) times daily with a meal. 22 tablet 0  . Cholecalciferol (VITAMIN D-3) 1000 UNITS CAPS Take 1 capsule by mouth every morning.    . docusate sodium (COLACE) 100 MG capsule Take 100 mg by mouth daily.     . Hyprom-Naphaz-Polysorb-Zn Sulf (CLEAR EYES COMPLETE OP) Apply 1-2 drops to eye daily as needed (dry eyes).    . Multiple Vitamins-Minerals (PRESERVISION AREDS 2 PO) Take 1 tablet by mouth 2 (two) times daily.    . OMEGA-3 KRILL OIL PO Take 1 tablet by mouth every morning.     No current facility-administered medications for this visit.    Allergies:  Review of patient's allergies indicates no known allergies.   Social History: History   Social History  . Marital Status: Widowed    Spouse Name: N/A  . Number of Children: 3  . Years of Education: N/A   Occupational History  .     Social History Main Topics  . Smoking status: Former Smoker    Types: Cigarettes    Quit date: 03/25/1952  . Smokeless tobacco: Never Used  . Alcohol Use: No  . Drug Use: No  . Sexual Activity: No   Other Topics Concern  . Not on file   Social History Narrative    Family History: Family History  Problem Relation Age of Onset  . Prostate cancer Father 69    died  . Pancreatic cancer Mother 56    died     Review of Systems: All other systems reviewed and are otherwise negative except as noted above.   Physical  Exam: VS:  BP 136/72 mmHg  Pulse 88  Ht 5\' 8"  (1.727 m)  Wt 168 lb (76.204 kg)  BMI 25.55 kg/m2  SpO2 96% , BMI Body mass index is 25.55 kg/(m^2).  GEN- The patient is elderly appearing, alert and oriented x 3 today.   HEENT: normocephalic, atraumatic; sclera clear, conjunctiva pink; hearing intact; oropharynx clear; neck supple  Lungs- Clear to ausculation bilaterally, normal work of breathing.  No wheezes, rales, rhonchi Heart- Regular rate and rhythm, 2/6 SEM GI- soft, non-tender, non-distended, bowel sounds present  Extremities- no clubbing, cyanosis, or edema; DP/PT/radial pulses 2+ bilaterally MS- no significant deformity or atrophy Skin- warm and dry, no rash or lesion; PPM pocket well healed Psych- euthymic mood, full affect Neuro- strength and sensation are intact  PPM Interrogation- reviewed in detail today,  See PACEART report  EKG:  EKG is not ordered today.  Recent Labs: 02/05/2014: ALT 12; BUN 43*; Creatinine, Ser 2.51*; Magnesium 2.1; Potassium 4.7; Sodium 141; TSH 1.370 02/07/2014: Hemoglobin 10.5*; Platelets 163   Wt Readings from Last 3 Encounters:  10/05/14 168 lb (76.204 kg)  02/05/14 168 lb 12.8 oz (76.567 kg)  01/07/14 178 lb (80.74 kg)     Other studies Reviewed: Additional studies/ records that were reviewed today include: office notes, hospital records  Assessment and Plan:  1.  Symptomatic bradycardia Normal PPM function See Pace Art report No changes today  2.  HTN Stable No change required today  3.  CKD BMET followed by Dr Forde Dandy per patient's report   Current medicines are reviewed at length with the patient today.   The patient does not have concerns regarding his medicines.  The following changes were made today:  none  Labs/ tests ordered today include: none   Disposition:   Follow up with device clinic in 6 months, Dr Lovena Le in 1 year.  He is disinclined to do remote monitoring which I think is ok with advanced age.     Signed, Chanetta Marshall, NP 10/05/2014 3:35 PM  [address] [address] [phone] (office) [phone] (fax)

## 2014-10-27 ENCOUNTER — Other Ambulatory Visit: Payer: Self-pay | Admitting: Urology

## 2014-10-27 ENCOUNTER — Ambulatory Visit (HOSPITAL_COMMUNITY)
Admission: RE | Admit: 2014-10-27 | Discharge: 2014-10-27 | Disposition: A | Discharge status: Home / Self Care | Payer: Medicare Other | Source: Ambulatory Visit | Attending: Urology | Admitting: Urology

## 2014-10-27 DIAGNOSIS — N135 Crossing vessel and stricture of ureter without hydronephrosis: Secondary | ICD-10-CM

## 2014-10-27 DIAGNOSIS — Z436 Encounter for attention to other artificial openings of urinary tract: Secondary | ICD-10-CM | POA: Insufficient documentation

## 2014-10-27 MED ORDER — IOHEXOL 300 MG/ML  SOLN
10.0000 mL | Freq: Once | INTRAMUSCULAR | Status: AC | PRN
  Administered 2014-10-27: 10 mL

## 2014-10-27 NOTE — Procedures (Signed)
Successful RT NEPHROURETERAL CATH EXCHG THRU OSTOMY No comp Stable Full report in PACS

## 2014-11-24 ENCOUNTER — Ambulatory Visit (HOSPITAL_COMMUNITY)
Admission: RE | Admit: 2014-11-24 | Discharge: 2014-11-24 | Disposition: A | Discharge status: Home / Self Care | Payer: Medicare Other | Source: Ambulatory Visit | Attending: Urology | Admitting: Urology

## 2014-11-24 ENCOUNTER — Other Ambulatory Visit: Payer: Self-pay | Admitting: Urology

## 2014-11-24 DIAGNOSIS — N135 Crossing vessel and stricture of ureter without hydronephrosis: Secondary | ICD-10-CM

## 2014-11-24 DIAGNOSIS — Z436 Encounter for attention to other artificial openings of urinary tract: Secondary | ICD-10-CM | POA: Diagnosis present

## 2014-11-24 MED ORDER — IOHEXOL 300 MG/ML  SOLN
5.0000 mL | Freq: Once | INTRAMUSCULAR | Status: DC | PRN
  Administered 2014-11-24: 5 mL
  Filled 2014-11-24: qty 10

## 2014-12-22 ENCOUNTER — Ambulatory Visit (HOSPITAL_COMMUNITY)
Admission: RE | Admit: 2014-12-22 | Discharge: 2014-12-22 | Disposition: A | Discharge status: Home / Self Care | Payer: Medicare Other | Source: Ambulatory Visit | Attending: Urology | Admitting: Urology

## 2014-12-22 ENCOUNTER — Other Ambulatory Visit: Payer: Self-pay | Admitting: Urology

## 2014-12-22 DIAGNOSIS — Z436 Encounter for attention to other artificial openings of urinary tract: Secondary | ICD-10-CM | POA: Diagnosis present

## 2014-12-22 DIAGNOSIS — N135 Crossing vessel and stricture of ureter without hydronephrosis: Secondary | ICD-10-CM

## 2014-12-22 MED ORDER — IOHEXOL 300 MG/ML  SOLN
5.0000 mL | Freq: Once | INTRAMUSCULAR | Status: DC | PRN
  Administered 2014-12-22: 5 mL
  Filled 2014-12-22: qty 10

## 2015-01-19 ENCOUNTER — Ambulatory Visit (HOSPITAL_COMMUNITY)
Admission: RE | Admit: 2015-01-19 | Discharge: 2015-01-19 | Disposition: A | Discharge status: Home / Self Care | Payer: Medicare Other | Source: Ambulatory Visit | Attending: Urology | Admitting: Urology

## 2015-01-19 ENCOUNTER — Other Ambulatory Visit: Payer: Self-pay | Admitting: Urology

## 2015-01-19 DIAGNOSIS — N135 Crossing vessel and stricture of ureter without hydronephrosis: Secondary | ICD-10-CM

## 2015-01-19 DIAGNOSIS — Z436 Encounter for attention to other artificial openings of urinary tract: Secondary | ICD-10-CM | POA: Diagnosis present

## 2015-01-19 MED ORDER — IOHEXOL 300 MG/ML  SOLN
10.0000 mL | Freq: Once | INTRAMUSCULAR | Status: DC | PRN
  Administered 2015-01-19: 10 mL
  Filled 2015-01-19: qty 10

## 2015-01-19 NOTE — Procedures (Signed)
Successful but difficult 12 fr nephroureteral cath exchg secondary to sediment and tube occlusion No comp Stable Full report in pacs Plan for exchg every 3 weeks now

## 2015-02-09 ENCOUNTER — Ambulatory Visit (HOSPITAL_COMMUNITY)
Admission: RE | Admit: 2015-02-09 | Discharge: 2015-02-09 | Disposition: A | Discharge status: Home / Self Care | Payer: Medicare Other | Source: Ambulatory Visit | Attending: Urology | Admitting: Urology

## 2015-02-09 ENCOUNTER — Other Ambulatory Visit: Payer: Self-pay | Admitting: Urology

## 2015-02-09 DIAGNOSIS — Z466 Encounter for fitting and adjustment of urinary device: Secondary | ICD-10-CM | POA: Insufficient documentation

## 2015-02-09 DIAGNOSIS — N135 Crossing vessel and stricture of ureter without hydronephrosis: Secondary | ICD-10-CM

## 2015-02-09 MED ORDER — IOHEXOL 300 MG/ML  SOLN
10.0000 mL | Freq: Once | INTRAMUSCULAR | Status: DC | PRN
  Administered 2015-02-09: 10 mL
  Filled 2015-02-09: qty 10

## 2015-02-17 ENCOUNTER — Other Ambulatory Visit (HOSPITAL_COMMUNITY): Payer: Medicare Other

## 2015-03-09 ENCOUNTER — Ambulatory Visit (HOSPITAL_COMMUNITY)
Admission: RE | Admit: 2015-03-09 | Discharge: 2015-03-09 | Disposition: A | Discharge status: Home / Self Care | Payer: Medicare Other | Source: Ambulatory Visit | Attending: Urology | Admitting: Urology

## 2015-03-09 ENCOUNTER — Other Ambulatory Visit: Payer: Self-pay | Admitting: Urology

## 2015-03-09 DIAGNOSIS — Z932 Ileostomy status: Secondary | ICD-10-CM | POA: Diagnosis not present

## 2015-03-09 DIAGNOSIS — N135 Crossing vessel and stricture of ureter without hydronephrosis: Secondary | ICD-10-CM

## 2015-03-09 DIAGNOSIS — Z466 Encounter for fitting and adjustment of urinary device: Secondary | ICD-10-CM | POA: Insufficient documentation

## 2015-03-09 MED ORDER — IOHEXOL 300 MG/ML  SOLN
10.0000 mL | Freq: Once | INTRAMUSCULAR | Status: AC | PRN
Start: 2015-03-09 — End: 2015-03-09
  Administered 2015-03-09: 10 mL

## 2015-03-10 ENCOUNTER — Other Ambulatory Visit: Payer: Self-pay | Admitting: Urology

## 2015-03-10 DIAGNOSIS — N135 Crossing vessel and stricture of ureter without hydronephrosis: Secondary | ICD-10-CM

## 2015-03-16 ENCOUNTER — Encounter: Payer: Self-pay | Admitting: *Deleted

## 2015-04-13 ENCOUNTER — Inpatient Hospital Stay (HOSPITAL_COMMUNITY): Admission: RE | Admit: 2015-04-13 | Payer: Medicare Other | Source: Ambulatory Visit

## 2015-04-20 ENCOUNTER — Other Ambulatory Visit: Payer: Self-pay | Admitting: Urology

## 2015-04-20 ENCOUNTER — Ambulatory Visit (HOSPITAL_COMMUNITY)
Admission: RE | Admit: 2015-04-20 | Discharge: 2015-04-20 | Disposition: A | Discharge status: Home / Self Care | Payer: Medicare Other | Source: Ambulatory Visit | Attending: Urology | Admitting: Urology

## 2015-04-20 DIAGNOSIS — N135 Crossing vessel and stricture of ureter without hydronephrosis: Secondary | ICD-10-CM

## 2015-04-20 DIAGNOSIS — Z436 Encounter for attention to other artificial openings of urinary tract: Secondary | ICD-10-CM | POA: Insufficient documentation

## 2015-04-20 MED ORDER — IOHEXOL 300 MG/ML  SOLN
10.0000 mL | Freq: Once | INTRAMUSCULAR | Status: AC | PRN
  Administered 2015-04-20: 10 mL

## 2015-05-02 IMAGING — XA IR PERC NEPHROSTOMY*R*
1 series · 2 of 2 positions shown · non-contrast
Comparison: none

CLINICAL DATA: 89-year-old male with right flank pain,
hydronephrosis and fever concerning for obstructed pyonephrosis.
TECHNIQUE: 2 g Ancef administered IV prior to skin incision.

[Series 300: tube placements · 2 of 2 slices shown]
[im 1/2]
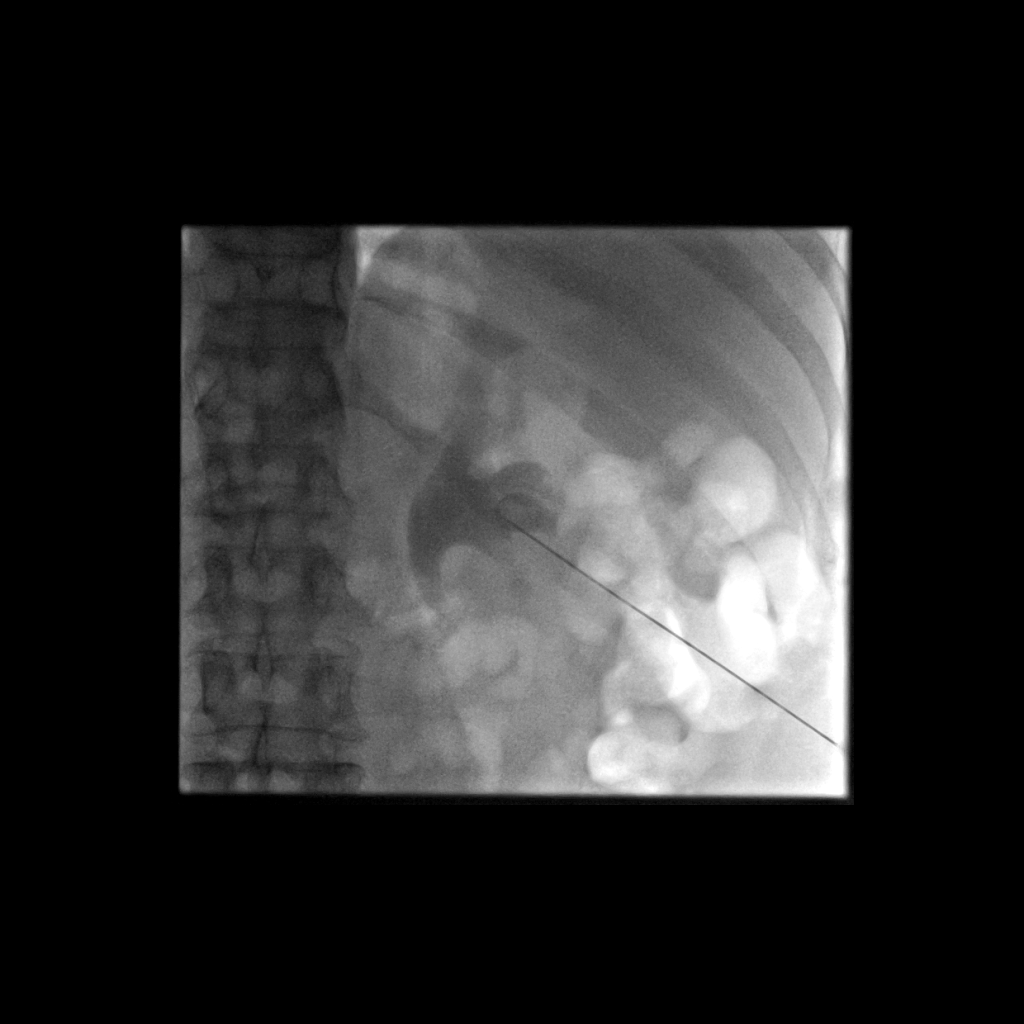
[im 2/2]
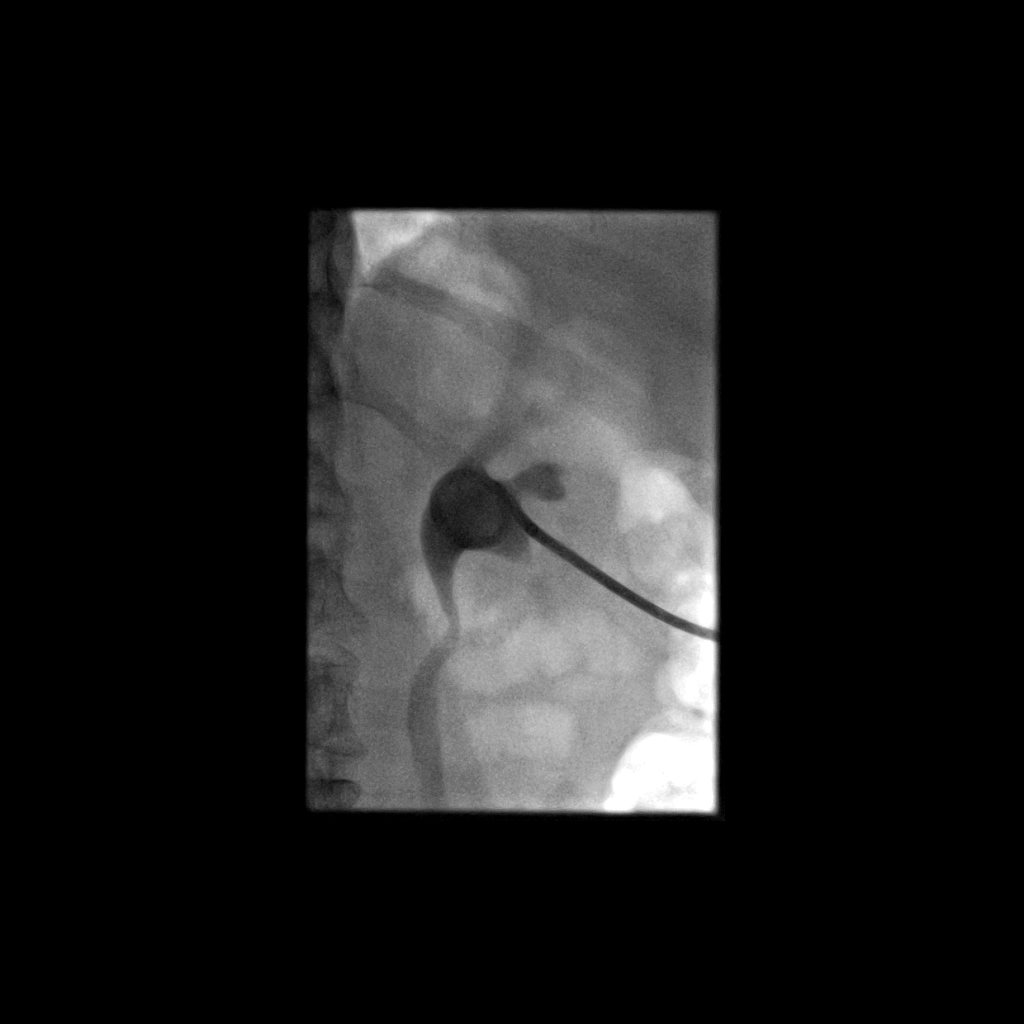

[2 of 2 positions shown; findings below may reference images not displayed]

EXAM:
PERC NEPHROSTOMY*R*; IR ULTRASOUND GUIDANCE

Date: 07/15/2013

PROCEDURE:
1. Ultrasound guided puncture of the right renal collecting system
2. Placement of a 10 French percutaneous nephrostomy tube with
fluoroscopic guidance

ANESTHESIA/SEDATION:
Moderate (conscious) sedation was used. 1 mg Versed, 50 mcg Fentanyl
were administered intravenously. The patient's vital signs were
monitored continuously by radiology nursing throughout the
procedure.

Sedation Time: 15 minutes

FLUOROSCOPY TIME:  36 seconds

CONTRAST:  5 cc Omnipaque administered into the renal collecting
system
Informed consent was obtained from the patient following explanation
of the procedure, risks, benefits and alternatives. The patient
understands, agrees and consents for the procedure. All questions
were addressed. A time out was performed.

Maximal barrier sterile technique utilized including caps, mask,
sterile gowns, sterile gloves, large sterile drape, hand hygiene,
and Betadine skin prep..

The right flank was interrogated with ultrasound. The right kidney
is hydronephrotic. A suitable access site into in interpolar calyx
was identified. Local anesthesia was attained by infiltration with
1% lidocaine. A small dermatotomy was made. Under real-time
sonographic guidance, a 21 gauge Accustick needle was advanced
through the posterolateral right flank and into the renal collecting
system. An image was obtained and stored. Confirmation of needle
placement was confirmed by return of turbid, foul-smelling urine.

A gentle hand injection of contrast material through the needle
confirmed access in the caliectasis and moderate hydronephrosis. An
0.018 wire was coiled within the renal pelvis an Accustick sheath
was advanced. The 0.018 wire was exchanged for a short Amplatz wire.
The tract was dilated to 10 French. Savio Locklear 10 French all-purpose
drainage catheter was advanced over the wire and formed with the
locking loop in the renal pelvis. There is immediate return of
turbid foul smelling urine. The catheter was secured to the skin
with 0 Prolene suture and a rubber bumper. The tube was connected to
gravity bag drainage.
IMPRESSION: Technically successful placement of a 10 French right-sided
percutaneous nephrostomy tube.

A sample of urine was sent for culture and sensitivities.

Following renal decompression and resolution of sepsis symptoms,
more dedicated antegrade nephroureterogram can be performed to
assess the etiology of the patient's distal ureteral obstruction.

## 2015-05-08 IMAGING — XA IR PERC INTRO URET CATH*R*
6 series · 6 of 6 positions shown · non-contrast
Comparison: IR US GUIDE BX ASP/DRAIN dated 07/14/2013

CLINICAL DATA: History of bladder cancer, post cystectomy and ileal
conduit creation, recently admitted with right-sided obstructive
uropathy, post ultrasound fluoroscopic guided percutaneous
nephrostomy tube placement (07/14/2013), now presenting for
conversion of nephrostomy catheter to a nephro ureteral catheter to
maintain percutaneous access for potential future percutaneous
nephrolithotomy.

EXAM:
1. RIGHT-SIDED NEPHROSTOGRAM.
2. FLUOROSCOPIC GUIDED CONVERSION OF RIGHT-SIDED NEPHROSTOMY
CATHETER TO RIGHT-SIDED NEPHRO URETERAL CATHETER.

[Series 2: care single · 1 of 1 slices shown (1 of 6)]
[im 1/1]
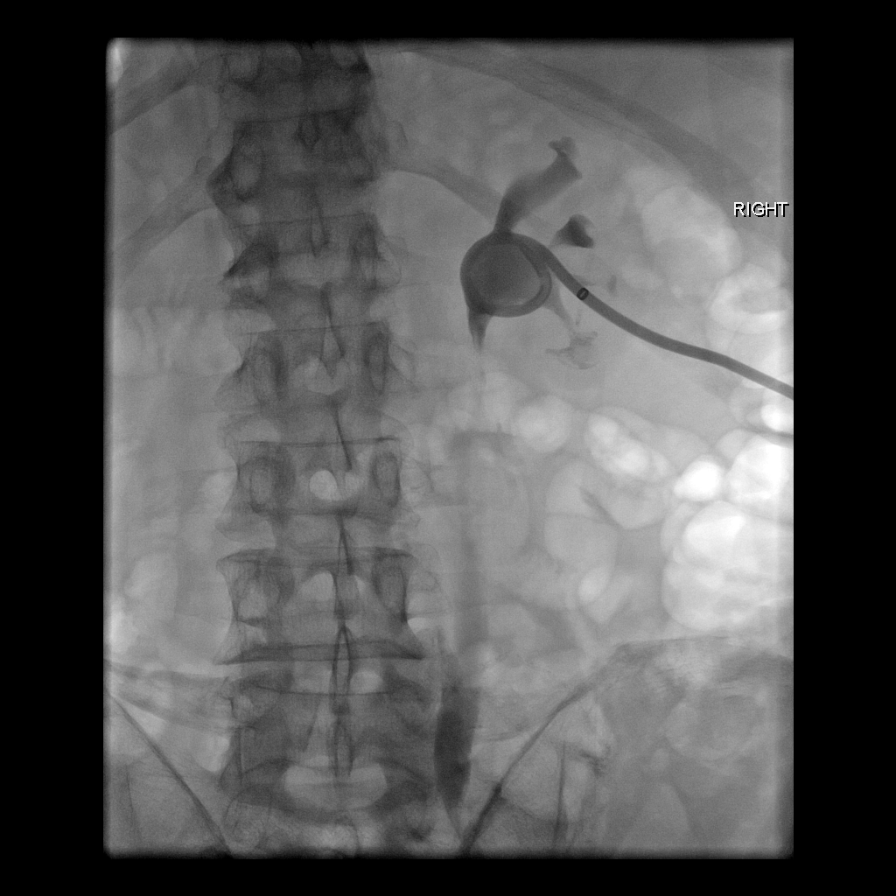

[Series 3: care single · 1 of 1 slices shown (2 of 6)]
[im 1/1]
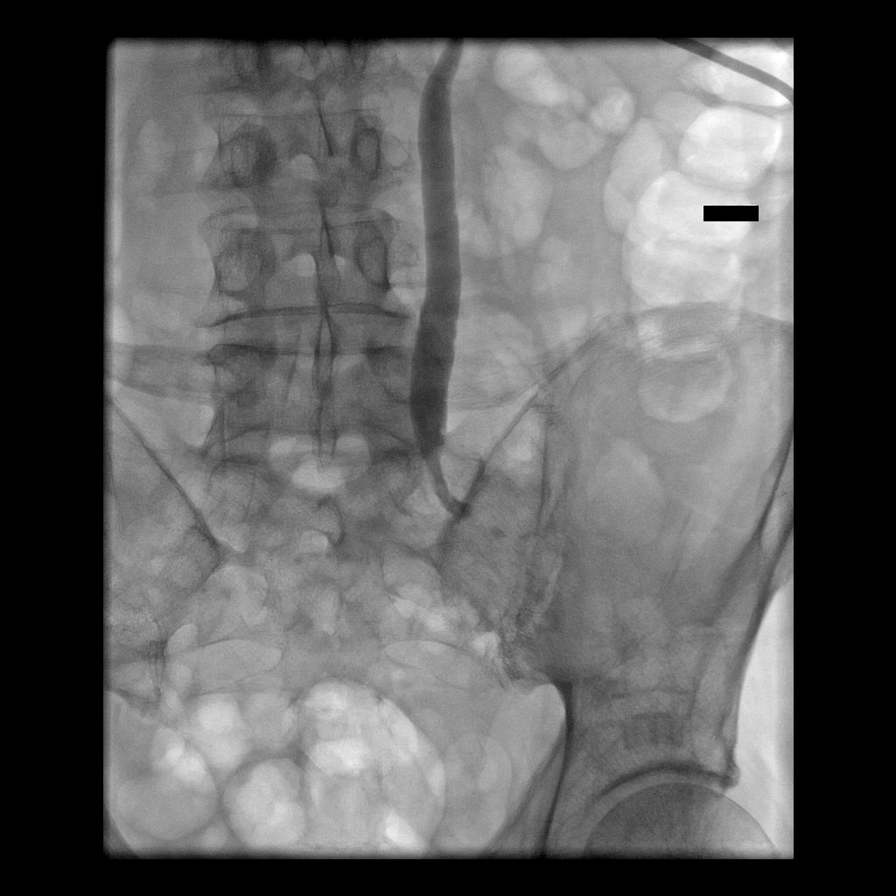

[Series 4: care single · 1 of 1 slices shown (3 of 6)]
[im 1/1]
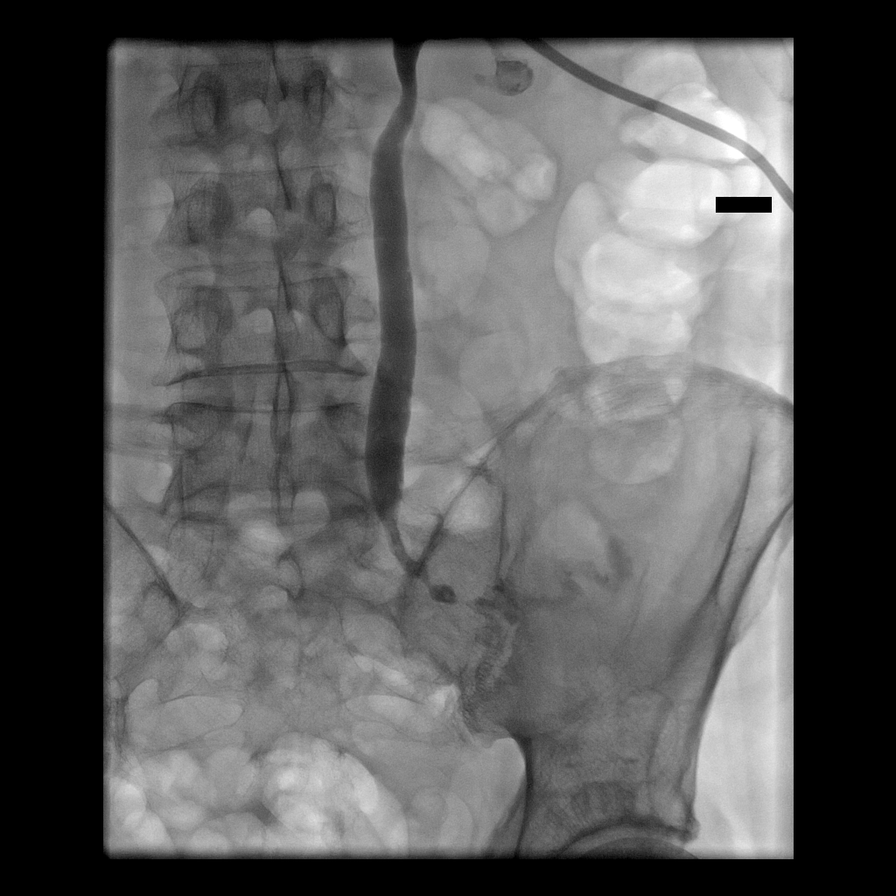

[Series 5: care single · 1 of 1 slices shown (4 of 6)]
[im 1/1]
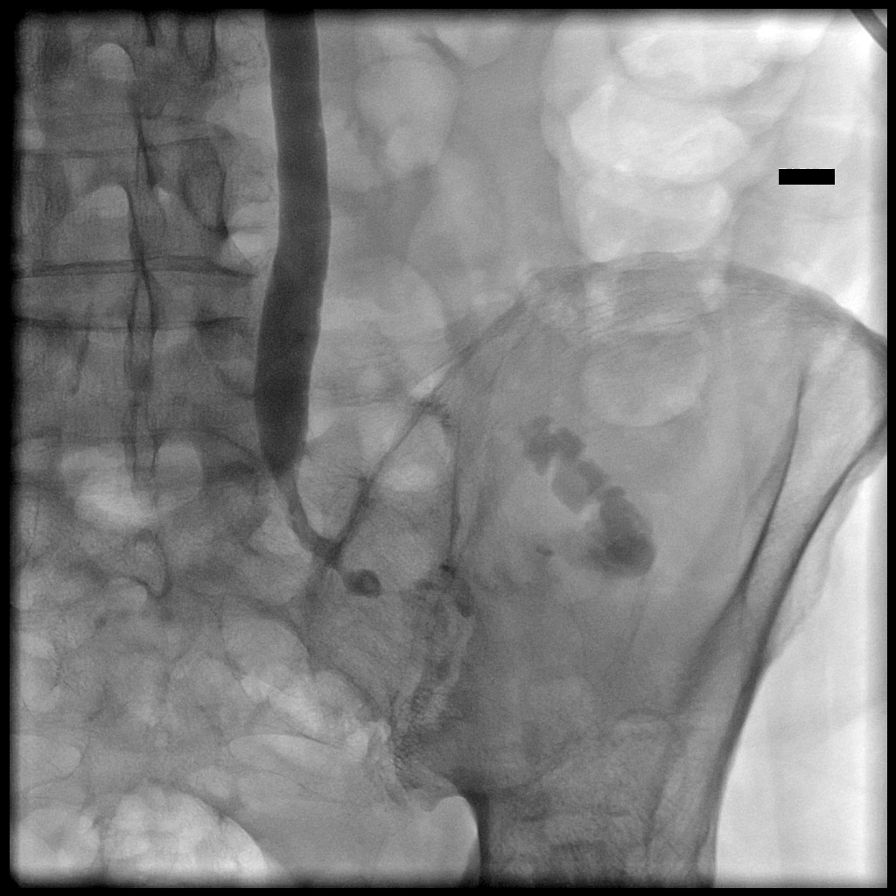

[Series 6: care single · 1 of 1 slices shown (5 of 6)]
[im 1/1]
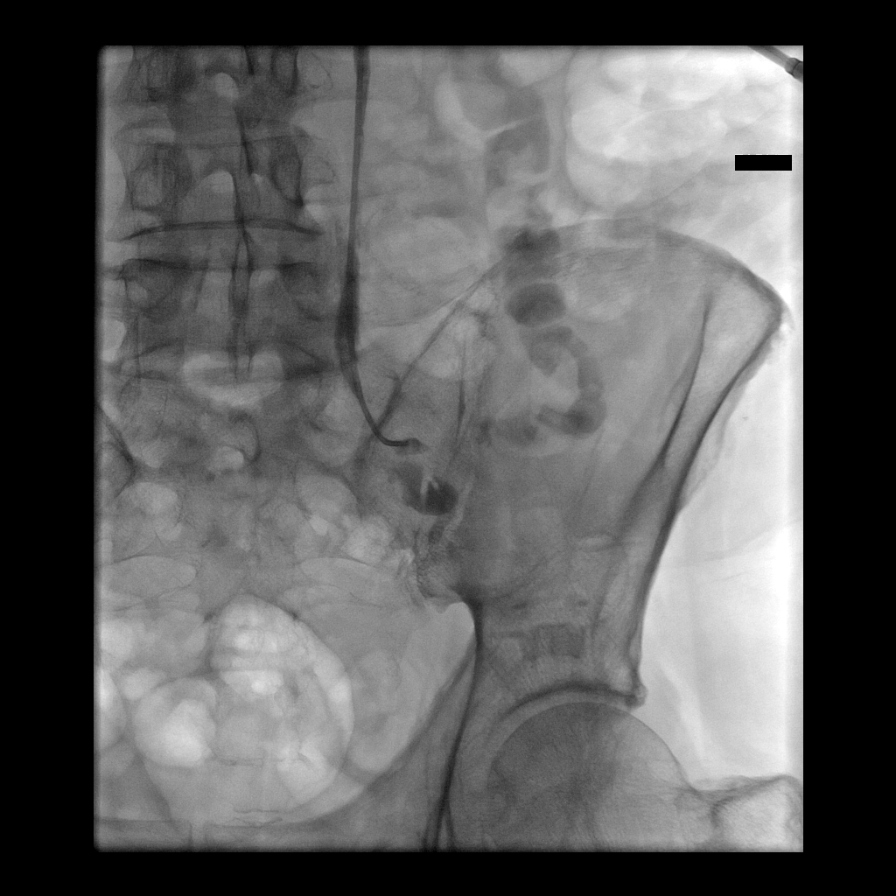

[Series 8: care single · 1 of 1 slices shown (6 of 6)]
[im 1/1]
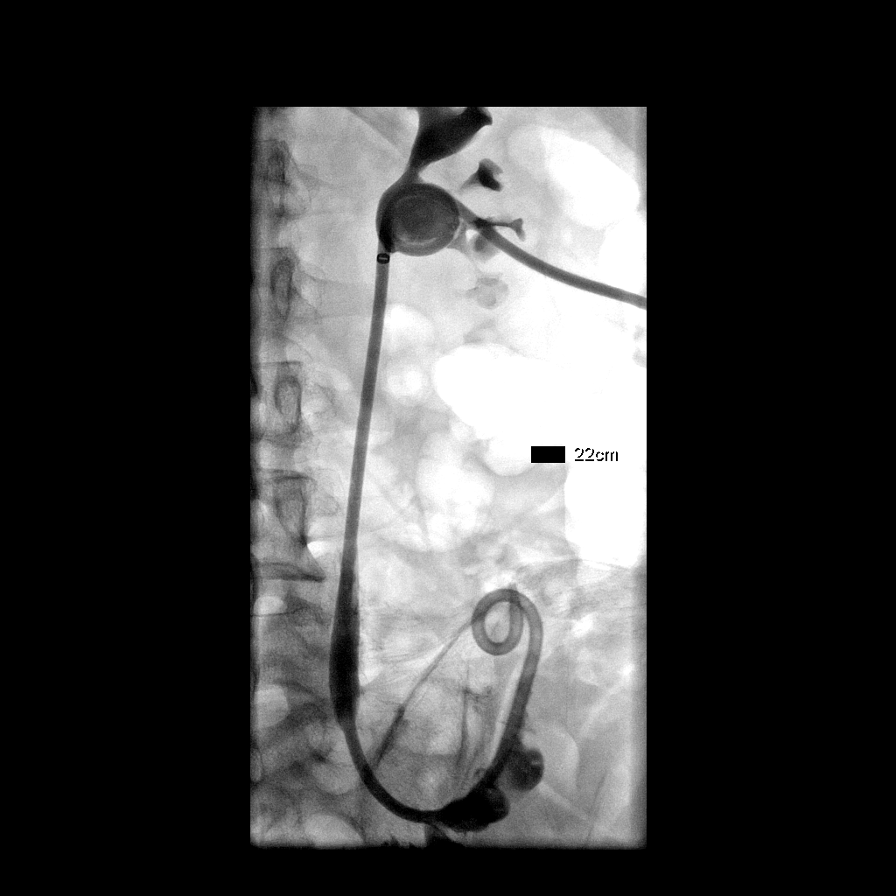

[6 of 6 positions shown; findings below may reference images not displayed]

MEDICATIONS:
Versed 1.5 mg IV; Fentanyl 50 mcg IV; Rocephin 1 g IV; The
antibiotics were administered within 1 hour of the procedure.

ANESTHESIA/SEDATION:
Total Moderate Sedation Time

15 minutes.

CONTRAST:  25 cc Omnipaque 300, administered into the right-sided
renal collecting system.

FLUOROSCOPY TIME:  3 minutes.  30 seconds.

COMPLICATIONS:
None immediate

PROCEDURE:
The procedure, risks, benefits, and alternatives were explained to
the patient. Questions regarding the procedure were encouraged and
answered. The patient understands and consents to the procedure.

The nephrostomy tubes and surrounding skin were prepped with
Betadine in a sterile fashion, and a sterile drape was applied
covering the operative field. A sterile gown and sterile gloves were
used for the procedure. Local anesthesia was provided with 1%
Lidocaine.

The preexisting right-sided nephrostomy tube was injected with
contrast material. Fluoroscopic spot images were obtained of the
collecting system and ureter to the level of the ileal conduit. The
existing nephrostomy catheter was cut and cannulated with a Benson
wire. Under intermittent fluoroscopic guidance, the existing
nephrostomy catheter was exchanged for a 9-French vascular sheath
which was advanced into the renal pelvis. With the use of a Bentson
wire, a Kumpe catheter was advanced through the right ureter and
into the right lower quadrant ileal conduit. Contrast injection
confirmed appropriate positioning.

The Kumpe catheter was utilized to estimate to appropriate length of
the nephro ureteral catheter. Over an Amplatz stiff glide wire and
under intermittent fluoroscopic guidance, a 10 French, 22 cm nephro
ureteral catheter was then advanced with distal portion formed at
the level of the ileal conduit and portion was then formed at the
level of the renal collecting system. Note, 22 cm is the shortest
nephro ureteral catheter length available. Limited contrast
injection confirmed appropriate positioning.

The nephro ureteral catheter was reconnected to a gravity bag (at
the patient's family request). The catheter was secured at the skin
exit site within interrupted suture. Dressings were placed. The
patient tolerated procedure well without immediate postprocedural
complication.
FINDINGS: Right-sided antegrade nephrostogram demonstrates the existing
right-sided percutaneous nephrostomy catheter is appropriately
positioned and functioning. There has been interval resolution of
previously noted moderate right-sided pelvicaliectasis.

There is mild irregularity and narrowing involving the distal aspect
of the right ureter, however contrast passes through the distal
ureter and into the ileal conduit. There is moderate upstream
ureterectasis.

Successful fluoroscopic conversion of right-sided percutaneous
nephrostomy to a new 10 French, 22 cm nephro ureteral catheter with
inferior coil within the ileal conduit and superior coil within the
right renal pelvis.
IMPRESSION: 1. Successful conversion of right-sided percutaneous nephrostomy
catheter to a 10 French, 22 cm nephro ureteral catheter with
inferior coil within the ileal conduit and superior coil within the
renal pelvis.
2. Nonocclusive mild irregular narrowing of the distal aspect of the
right ureter regional to the anastomosis, possibly secondary to
anastomotic stricture though further evaluation could be performed
with direct visualization as clinically indicated.

PLAN:
The nephro ureteral catheter was reconnected to a gravity bag at the
patient's family request secondary to impending nephrolithotomy
procedure scheduled for early next week.

## 2015-05-15 IMAGING — RF DG ABDOMEN 1V
1 series · 1 of 1 positions shown · non-contrast
Comparison: None.

CLINICAL DATA: Right renal and ureteral stone.

EXAM:
ABDOMEN - 1 VIEW; DG C-ARM 1-60 MIN - NRPT [facility]

[Series 1: run · 1 of 1 slices shown]
[im 1/1]
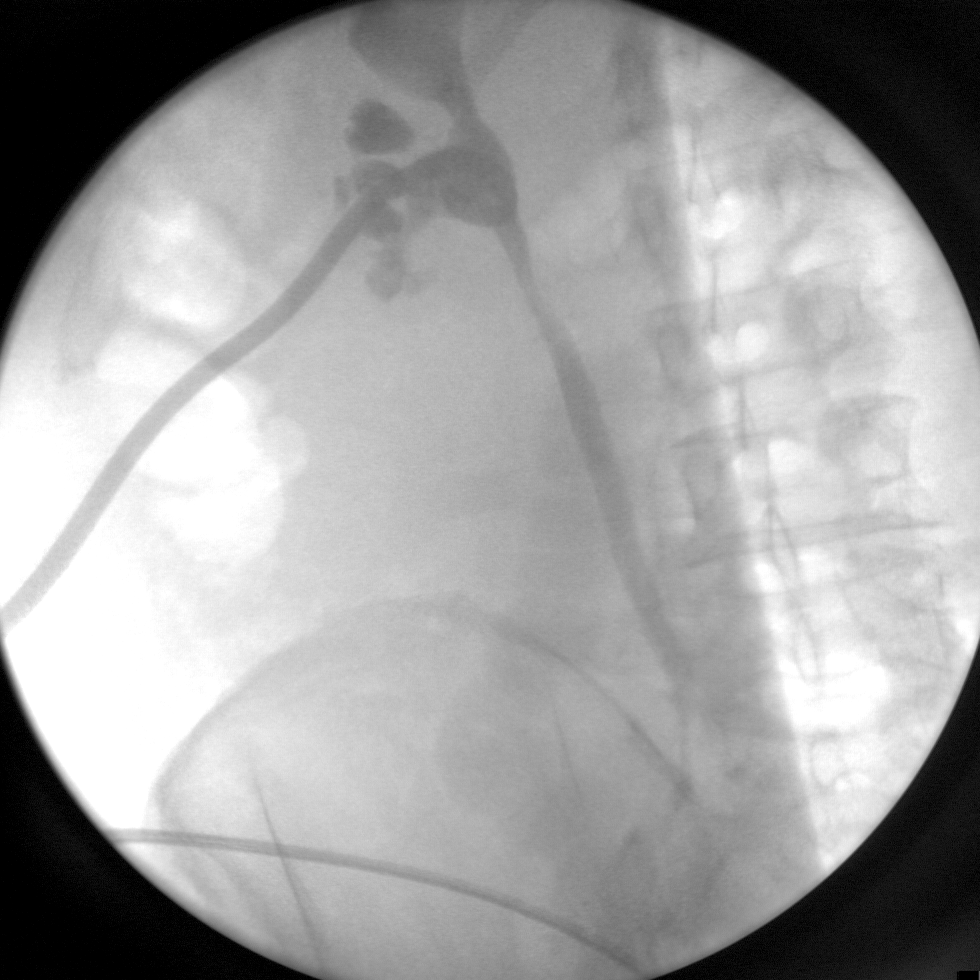

[1 of 1 positions shown; findings below may reference images not displayed]

FINDINGS: Single C-arm image demonstrates a right nephrostomy catheter in the
right renal pelvis. Contrast opacifies the proximal right ureter and
the renal pelvis and calices.
IMPRESSION: Right percutaneous nephrostomy catheter in place.

## 2015-05-17 ENCOUNTER — Ambulatory Visit (INDEPENDENT_AMBULATORY_CARE_PROVIDER_SITE_OTHER): Payer: Medicare Other | Admitting: Ophthalmology

## 2015-05-17 DIAGNOSIS — I10 Essential (primary) hypertension: Secondary | ICD-10-CM

## 2015-05-17 DIAGNOSIS — H353114 Nonexudative age-related macular degeneration, right eye, advanced atrophic with subfoveal involvement: Secondary | ICD-10-CM

## 2015-05-17 DIAGNOSIS — H43813 Vitreous degeneration, bilateral: Secondary | ICD-10-CM

## 2015-05-17 DIAGNOSIS — H35033 Hypertensive retinopathy, bilateral: Secondary | ICD-10-CM

## 2015-05-17 DIAGNOSIS — H353221 Exudative age-related macular degeneration, left eye, with active choroidal neovascularization: Secondary | ICD-10-CM

## 2015-05-17 IMAGING — CR DG CHEST 1V PORT
1 series · 1 of 1 positions shown · non-contrast
Comparison: 07/21/2013

CLINICAL DATA: Fevers

EXAM:
PORTABLE CHEST - 1 VIEW

[AP]
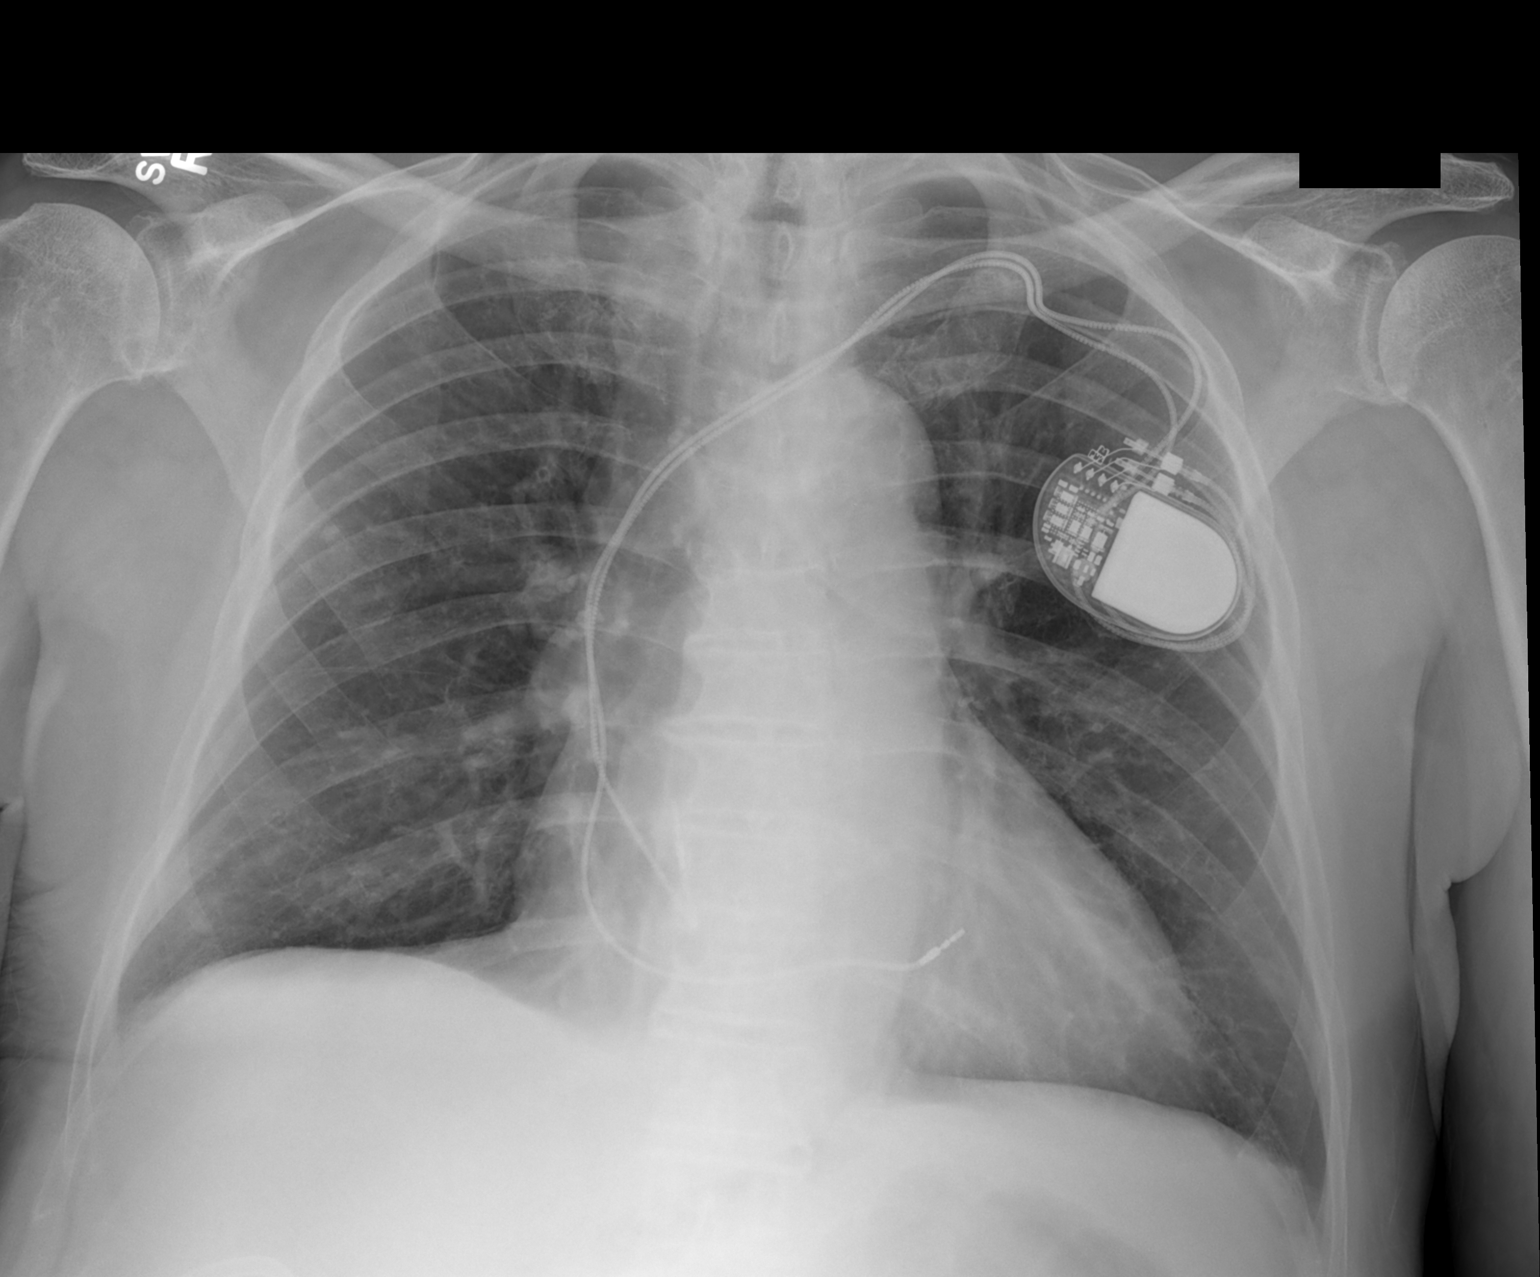

[1 of 1 positions shown; findings below may reference images not displayed]

FINDINGS: Left chest wall pacer device is noted with lead in the right atrial
appendage and right ventricle. The heart size and mediastinal
contours are within normal limits. Granuloma is noted in the right
upper lobe. Both lungs are clear. The visualized skeletal structures
are unremarkable.
IMPRESSION: No active disease.

## 2015-05-21 IMAGING — XA IR NEPHROSTOGRAM*R*
3 series · 7 of 7 positions shown · non-contrast
Comparison: none

CLINICAL DATA: Right nephrostomy in place.

[Series 1: fl - angio · 4 of 100 frames shown]
[frame 2/100]
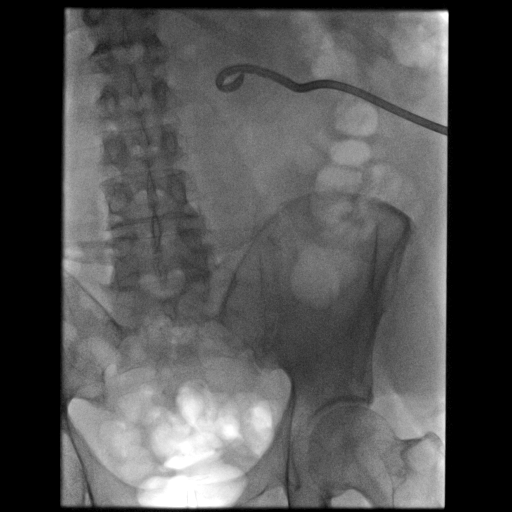
[frame 16/100]
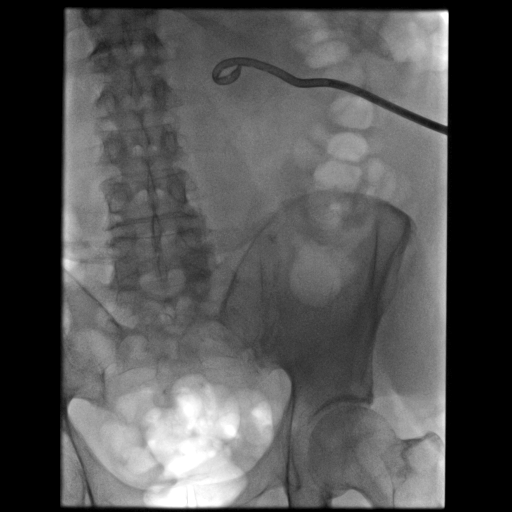
[frame 51/100]
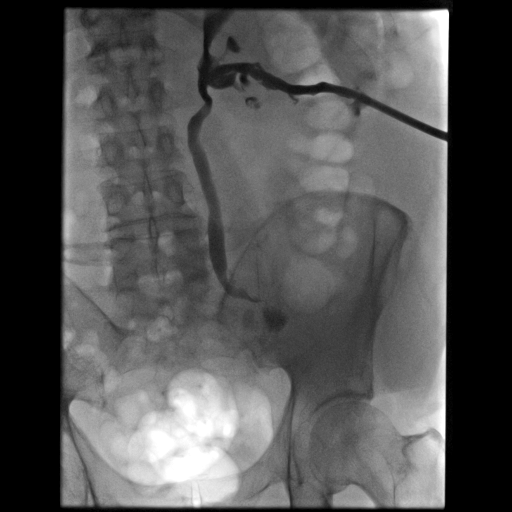
[frame 86/100]
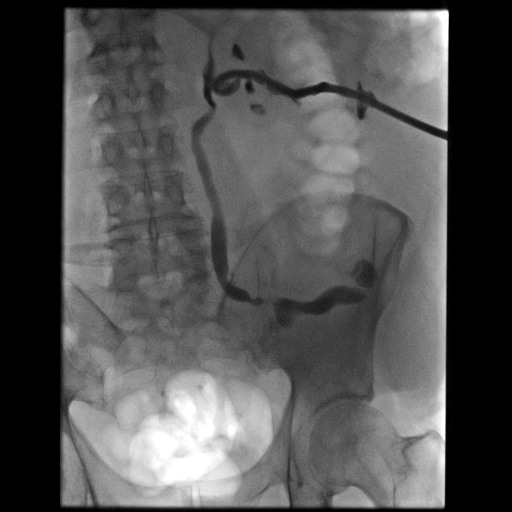

[Series 2: care single · 1 of 1 slices shown]
[im 1/1]
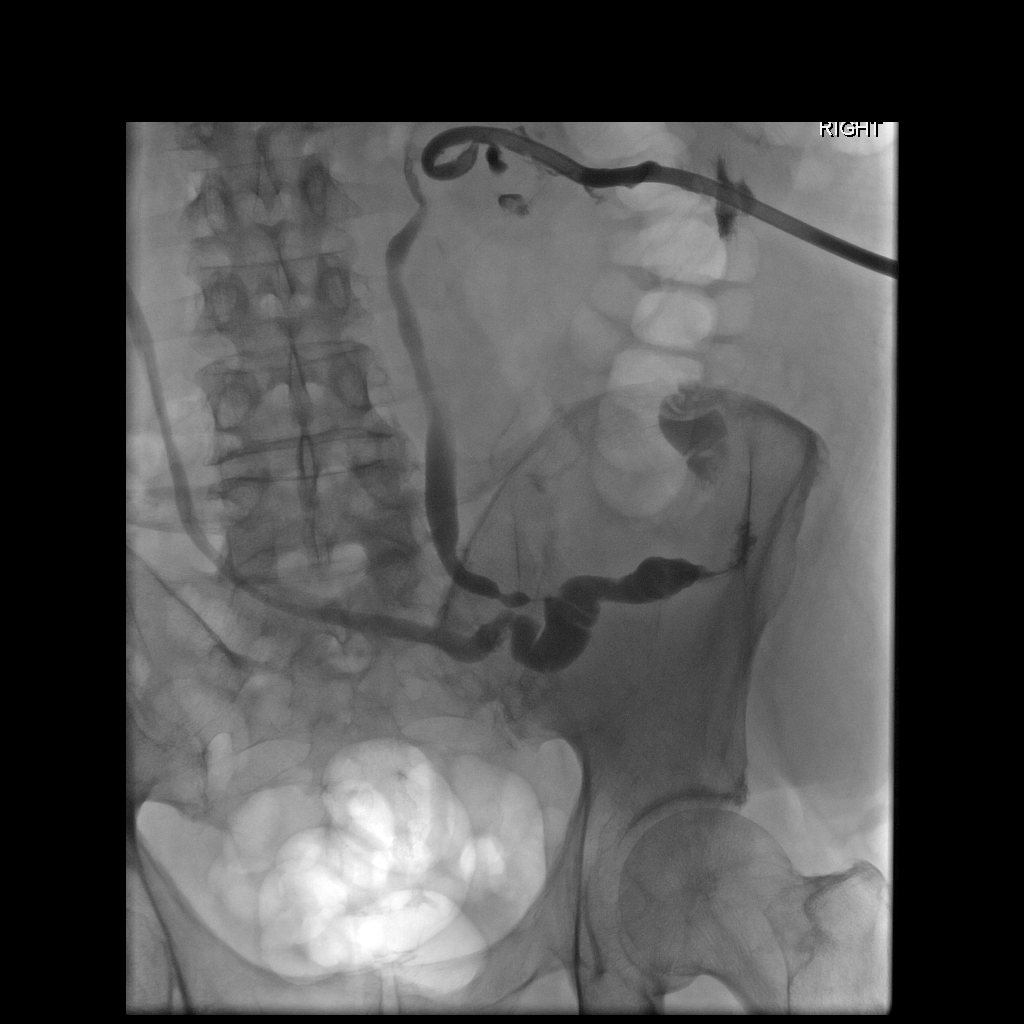

[Series 300: tube placements · 2 of 2 slices shown]
[im 1/2]
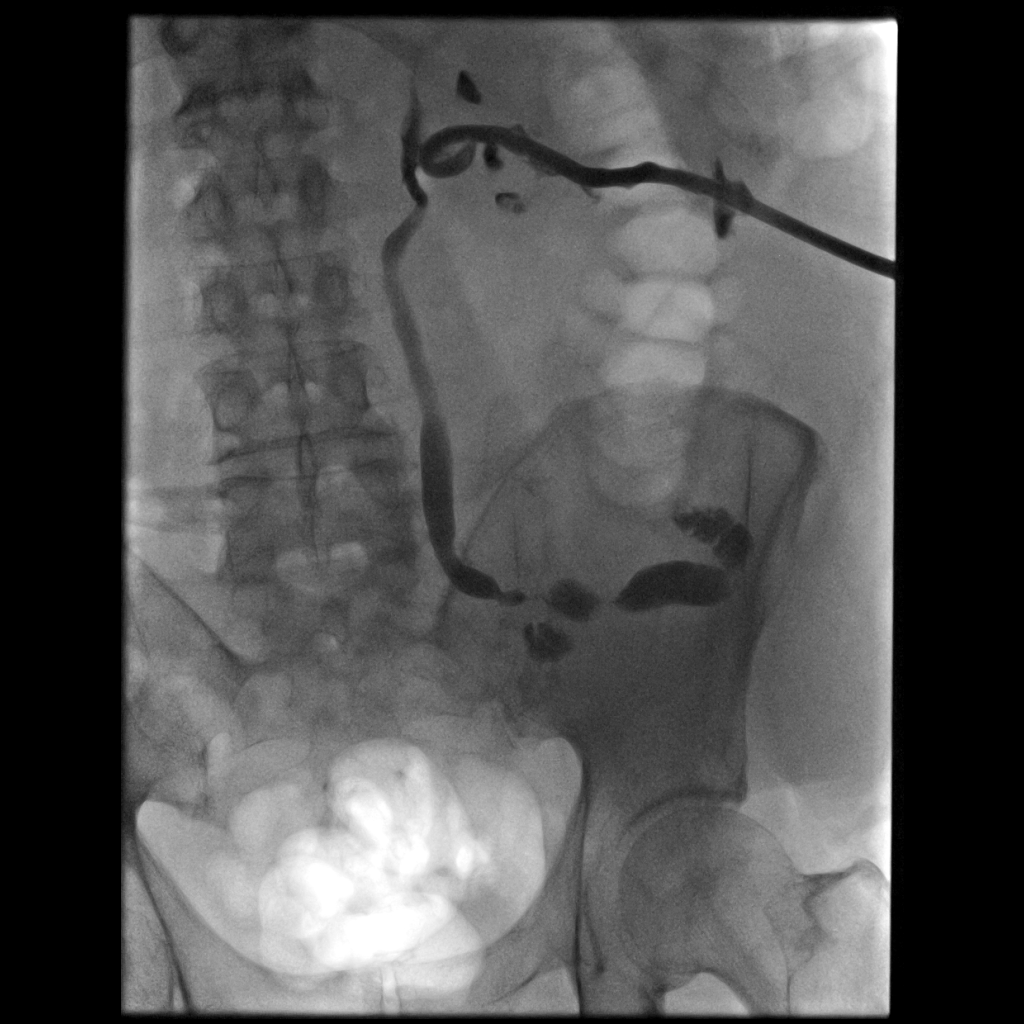
[im 2/2]
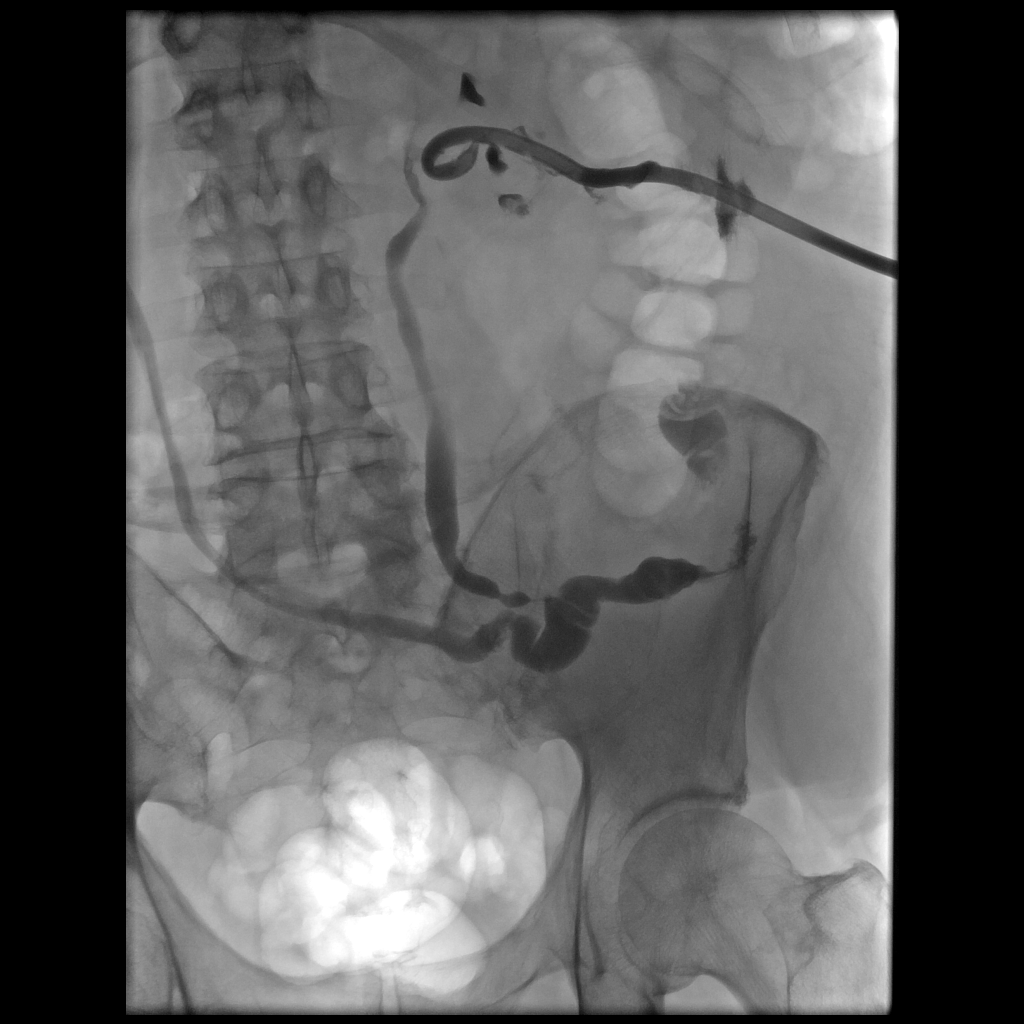

[7 of 7 positions shown; findings below may reference images not displayed]

EXAM:
RIGHT NEPHROSTOGRAM

FLUOROSCOPY TIME:  18 seconds.

MEDICATIONS AND MEDICAL HISTORY:
None.

ANESTHESIA/SEDATION:
None.

CONTRAST:  10 cc Omnipaque 300

PROCEDURE:
The procedure, risks, benefits, and alternatives were explained to
the patient. Questions regarding the procedure were encouraged and
answered. The patient understands and consents to the procedure.

Contrast was injected into the right nephrostomy and imaging was
obtained.
FINDINGS: The right ureter is normal in course and caliber leading to the
ileal conduit. There is patency of the ureter, however there is a
stricture at distal anastomosis to the ileal conduit. There is also
reflux of contrast into the left ureter.

COMPLICATIONS:
None
IMPRESSION: Right ureter is patent, however there is a stricture at the distal
anastomosis to the ileal conduit.

## 2015-05-23 IMAGING — XA IR EXT NEPHROURETERAL CATH EXCHANGE
1 series · 7 of 7 positions shown · non-contrast
Comparison: none

CLINICAL DATA: History of cystectomy and ureteral stricture at
anastomosis of the distal right ureter and ileal conduit. Status
post percutaneous nephrolithotomy with placement of nephrostomy
tube. Nephroureteral access has been requested across the ureteral
stricture.

[Series 300: ir ext nephroureteral cath exchange · 7 of 7 slices shown]
[im 1/7]
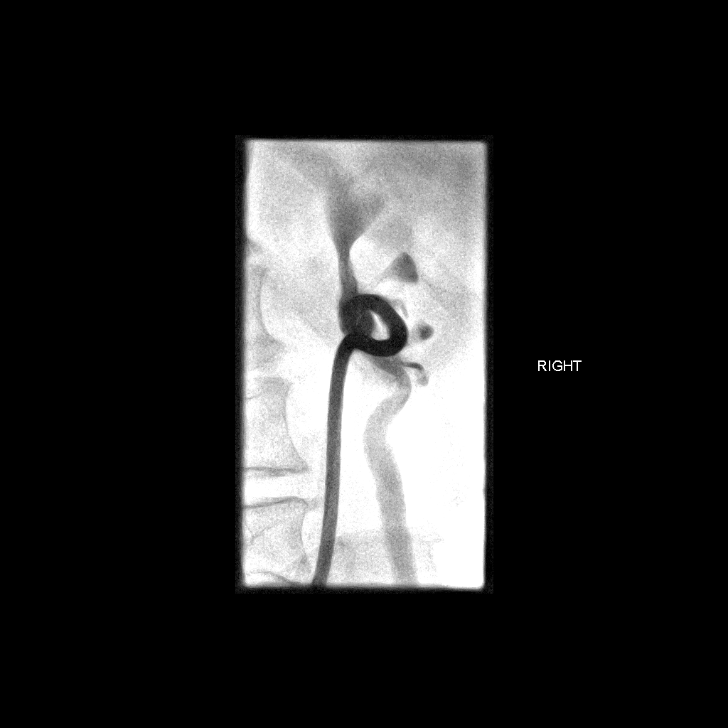
[im 2/7]
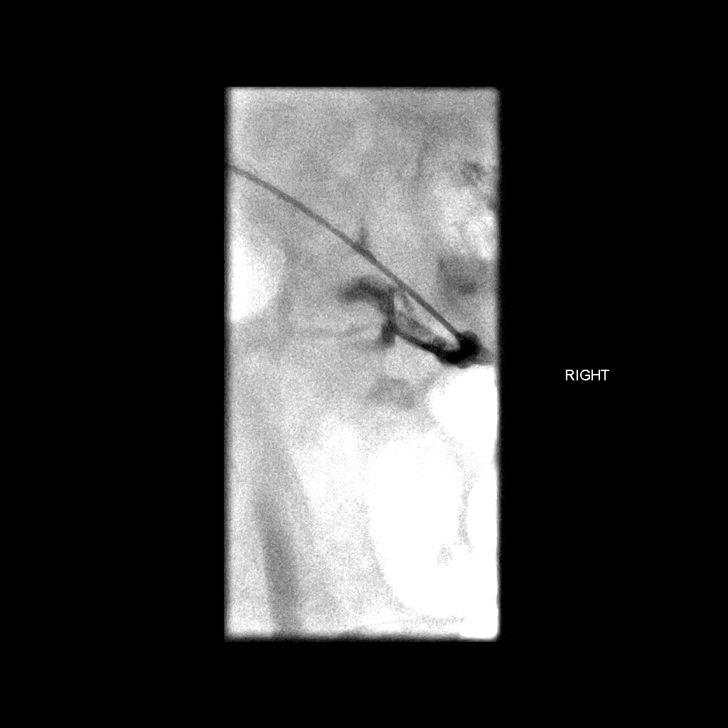
[im 3/7]
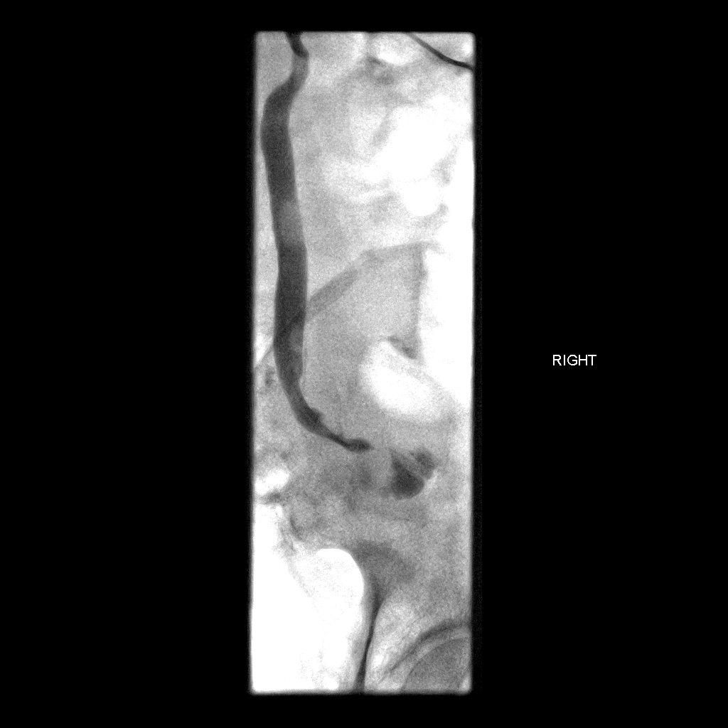
[im 4/7]
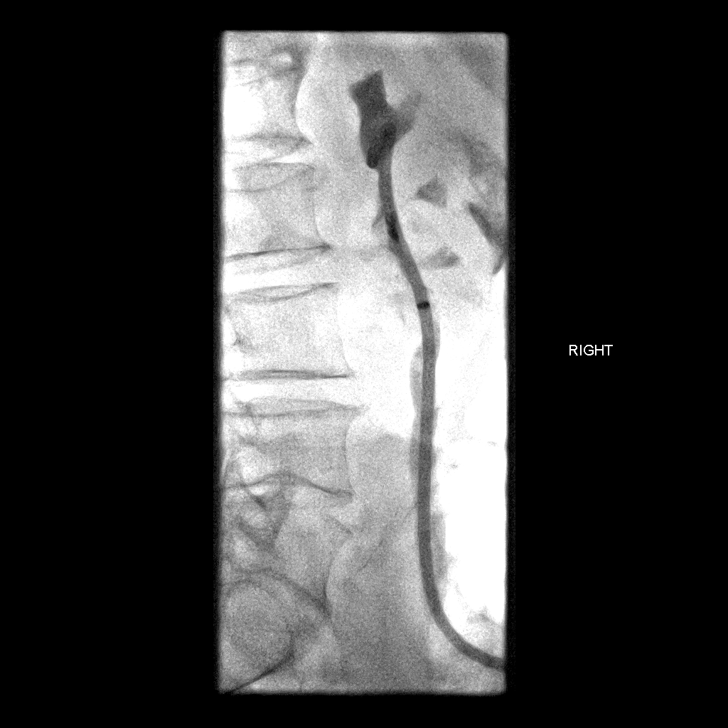
[im 5/7]
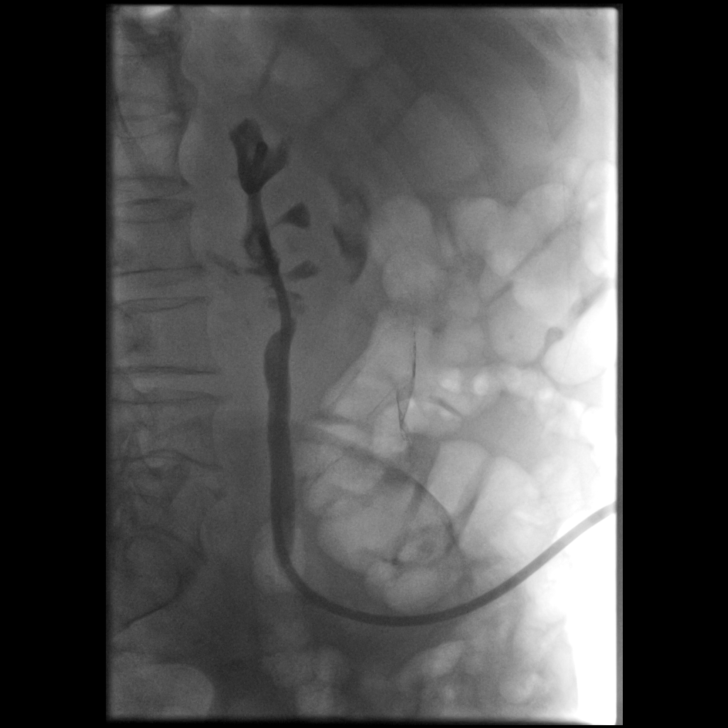
[im 6/7]
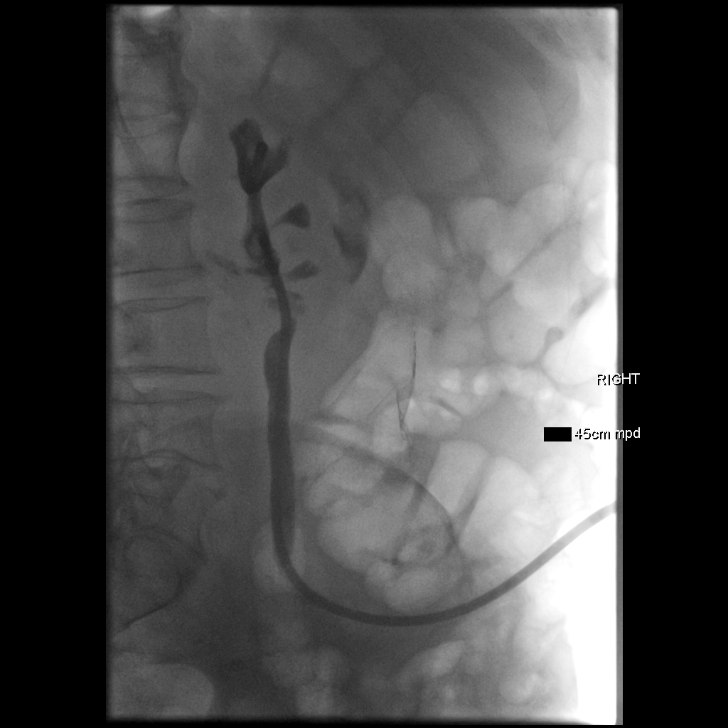
[im 7/7]
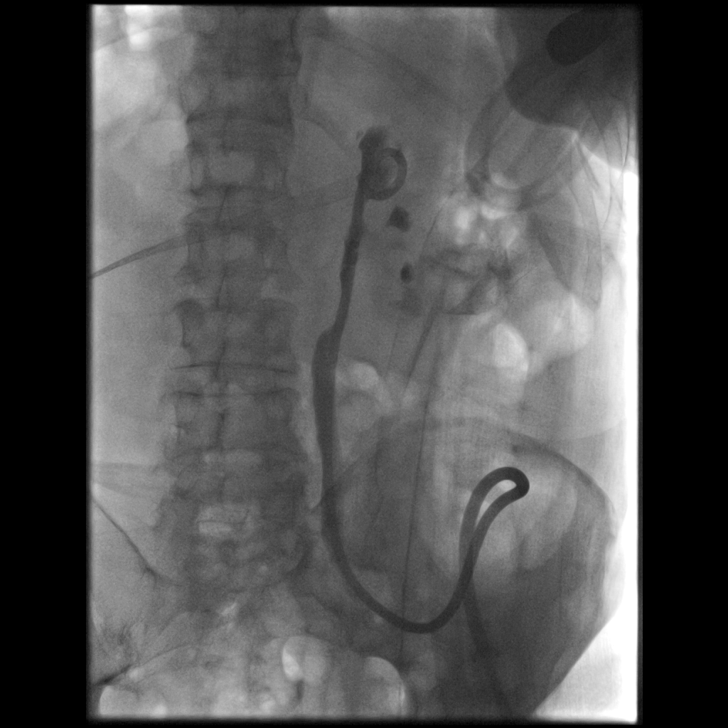

[7 of 7 positions shown; findings below may reference images not displayed]

EXAM:
RIGHT NEPHROURETERAL CATHETER EXCHANGE

CONTRAST:  30 ml Qmnipaque-KFF

FLUOROSCOPY TIME:  7 min and 42 seconds.

MEDICATIONS:
Sedation:  2 mg IV Versed a, 100 mcg IV fentanyl.

Total Moderate Sedation Time:  25 minutes.

2 g IV Ancef. As antibiotic prophylaxis, Ancef was ordered
pre-procedure and administered intravenously within one hour of
incision.

PROCEDURE:
The procedure, risks, benefits, and alternatives were explained to
the patient. Questions regarding the procedure were encouraged and
answered. The patient understands and consents to the procedure.

The right flank region, pre-existing nephrostomy tube and ileostomy
site were prepped with Betadine in a sterile fashion, and a sterile
drape was applied covering the operative field. sterile gown and
sterile gloves were used for the procedure.

The preexisting right nephrostomy tube was injected with contrast
material under fluoroscopy. It was then removed over a guidewire. A
5 French diagnostic catheter was then advanced into the ureter over
a guidewire. The catheter was further advanced into the ileal
conduit. Contrast injection was performed. The catheter was then
advanced out of the ileostomy. Through and through guidewire access
was established via the right percutaneous nephrostomy tract and
exiting out of the ileostomy.

A 12 French by 45 cm nephroureteral catheter was then advanced in a
retrograde direction through the ileostomy and to the level of the
right renal collecting system. The catheter was formed.

Final catheter position was confirmed with a fluoroscopic spot image
obtained after injection of contrast. The exiting end of the
catheter was placed in the ileostomy bag.

COMPLICATIONS:
None.
FINDINGS: Initial nephrostomy tube injection shows a decompressed collecting
system. After gaining ureteral access, contrast injection confirms
the presence of a known distal ureteral stricture at the level of
right ureteral anastomosis with the ileal conduit. After crossing
the stricture, guidewire access was obtained outside of the
ileostomy and a 12 French retrograde catheter advanced. This was
formed in the upper pole collecting system.
IMPRESSION: Conversion of right percutaneous nephrostomy tube to a a retrograde
12 French nephroureteral catheter through the ileostomy to the level
of the renal collecting system.

## 2015-06-08 ENCOUNTER — Other Ambulatory Visit: Payer: Self-pay | Admitting: Urology

## 2015-06-08 ENCOUNTER — Ambulatory Visit (HOSPITAL_COMMUNITY)
Admission: RE | Admit: 2015-06-08 | Discharge: 2015-06-08 | Disposition: A | Discharge status: Home / Self Care | Payer: Medicare Other | Source: Ambulatory Visit | Attending: Urology | Admitting: Urology

## 2015-06-08 DIAGNOSIS — Z466 Encounter for fitting and adjustment of urinary device: Secondary | ICD-10-CM | POA: Diagnosis present

## 2015-06-08 DIAGNOSIS — Y831 Surgical operation with implant of artificial internal device as the cause of abnormal reaction of the patient, or of later complication, without mention of misadventure at the time of the procedure: Secondary | ICD-10-CM | POA: Insufficient documentation

## 2015-06-08 DIAGNOSIS — N135 Crossing vessel and stricture of ureter without hydronephrosis: Secondary | ICD-10-CM

## 2015-06-08 DIAGNOSIS — T8389XA Other specified complication of genitourinary prosthetic devices, implants and grafts, initial encounter: Secondary | ICD-10-CM | POA: Insufficient documentation

## 2015-06-08 DIAGNOSIS — Z906 Acquired absence of other parts of urinary tract: Secondary | ICD-10-CM | POA: Insufficient documentation

## 2015-06-08 MED ORDER — IOHEXOL 300 MG/ML  SOLN
10.0000 mL | Freq: Once | INTRAMUSCULAR | Status: AC | PRN
  Administered 2015-06-08: 10 mL

## 2015-06-08 NOTE — Procedures (Signed)
Interventional Radiology Procedure Note  Procedure:  Image guided right percutaneous nephroureteral tube exchange.  102F, 45cm  Findings:  Completely obstructed tube.  Difficult exchange.   Complications: None Recommendations:  - To drainage -  Routine exchange in 5-6 weeks/  Signed,  Dulcy Fanny. Earleen Newport, DO

## 2015-06-12 ENCOUNTER — Encounter (INDEPENDENT_AMBULATORY_CARE_PROVIDER_SITE_OTHER): Payer: Medicare Other | Admitting: Ophthalmology

## 2015-06-12 DIAGNOSIS — H43813 Vitreous degeneration, bilateral: Secondary | ICD-10-CM

## 2015-06-12 DIAGNOSIS — H35033 Hypertensive retinopathy, bilateral: Secondary | ICD-10-CM

## 2015-06-12 DIAGNOSIS — I10 Essential (primary) hypertension: Secondary | ICD-10-CM | POA: Diagnosis not present

## 2015-06-12 DIAGNOSIS — H353221 Exudative age-related macular degeneration, left eye, with active choroidal neovascularization: Secondary | ICD-10-CM

## 2015-06-12 DIAGNOSIS — H353114 Nonexudative age-related macular degeneration, right eye, advanced atrophic with subfoveal involvement: Secondary | ICD-10-CM

## 2015-07-06 IMAGING — XA IR CATHETER TUBE CHANGE
1 series · 5 of 5 positions shown · non-contrast
Comparison: Fluoroscopic guided guided conversion right-sided
nephro ureteral catheter to a retrograde nephro ureteral catheter -
08/04/2013

INDICATION: History of cystectomy and ureteral stricture at the anastomosis of
the distal aspect of the right ureter an ileal conduit. Post
percutaneous percutaneous nephrolithotomy and placement of a
nephrostomy catheter with ultimate conversion of the nephrostomy
catheter to a retrograde nephro ureteral catheter. Patient presents
today for routine exchange.

EXAM:
FLUOROSCOPIC GUIDED RIGHT SIDED RETROGRADE NEPHRO URETERAL CATHETER
EXCHANGE
TECHNIQUE: Informed written consent was obtained from the patient after a
discussion of the risks, benefits and alternatives to treatment.
Questions regarding the procedure were encouraged and answered. A
timeout was performed prior to the initiation of the procedure.

[Series 300: tube placements · 5 of 5 slices shown]
[im 1/5]
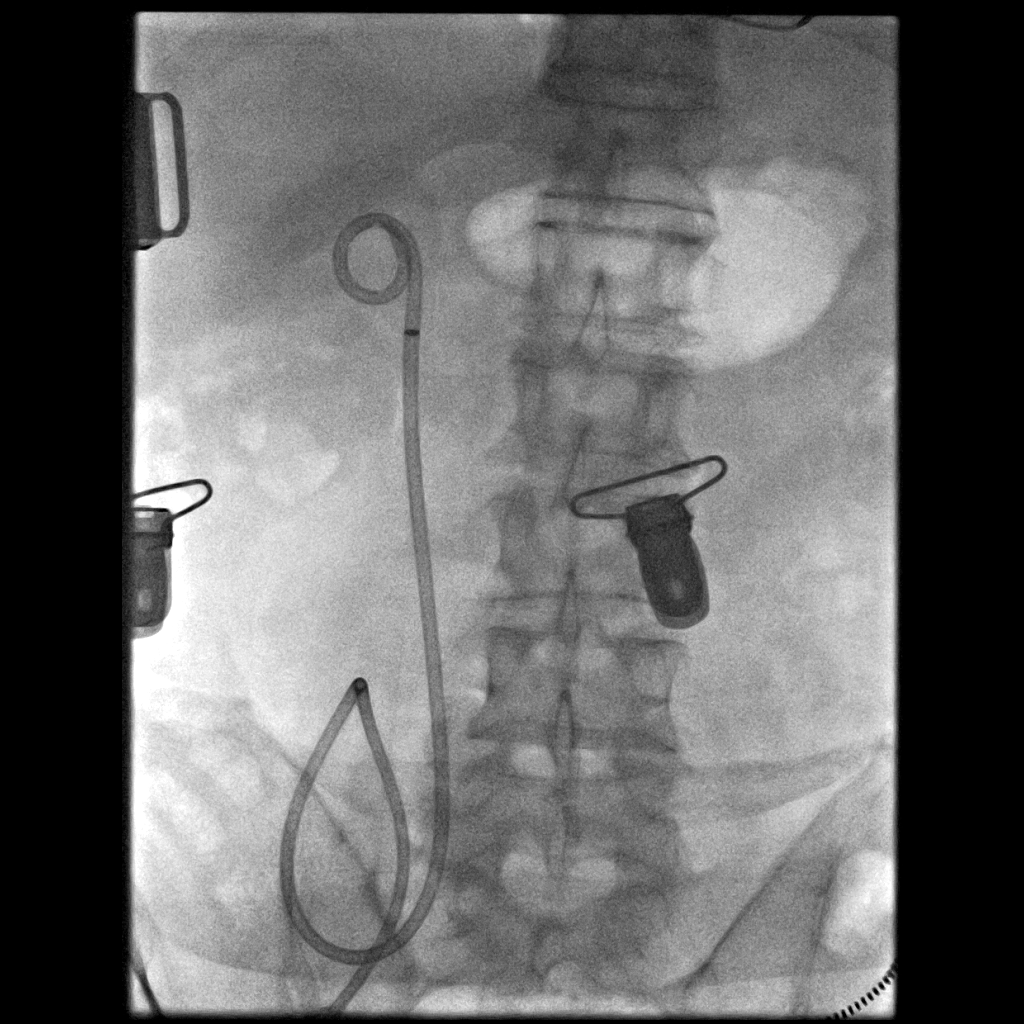
[im 2/5]
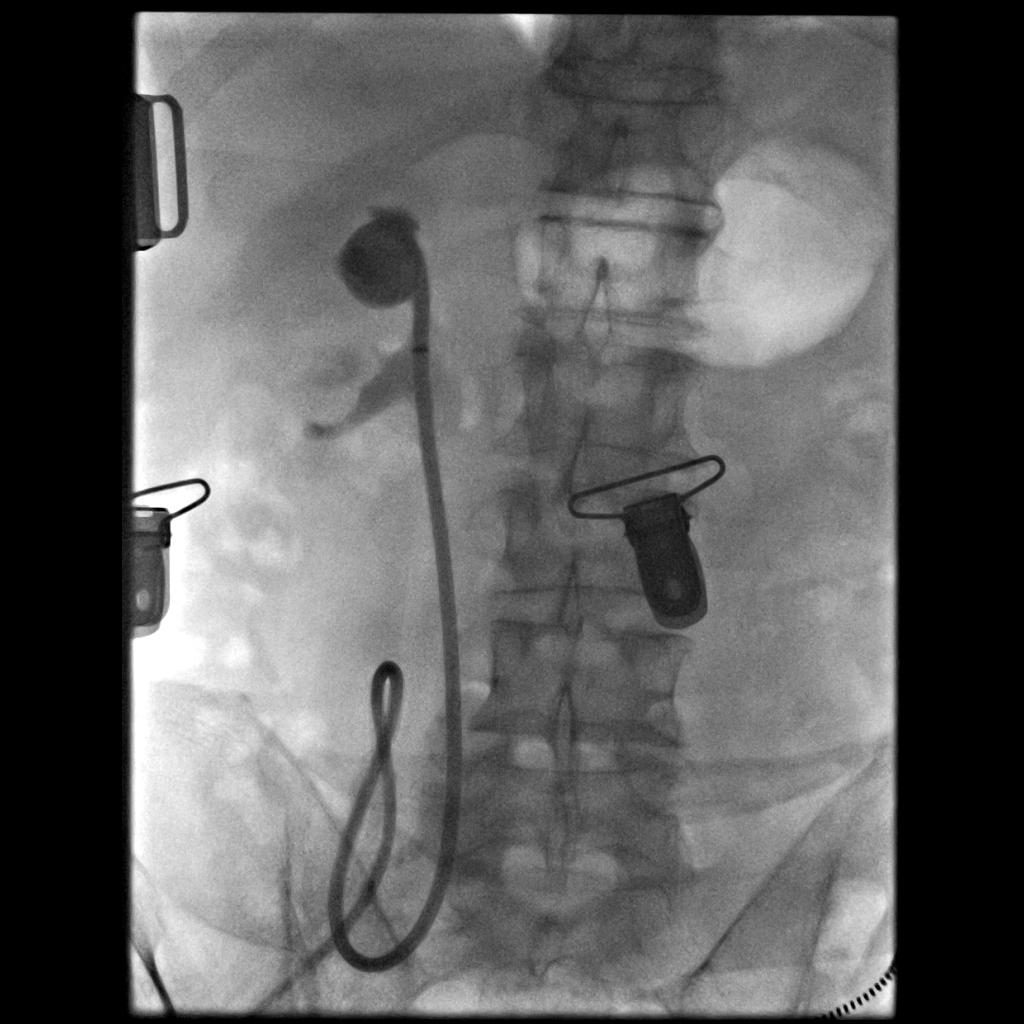
[im 3/5]
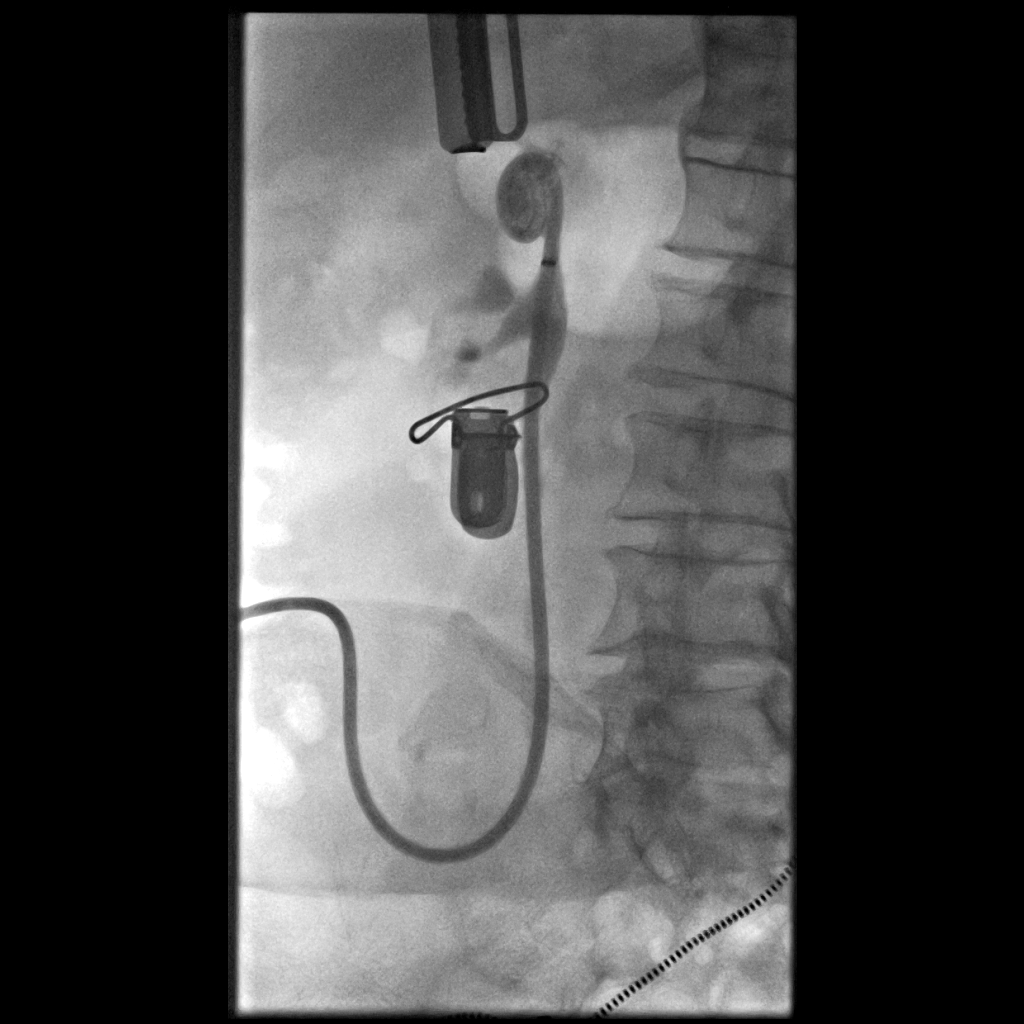
[im 4/5]
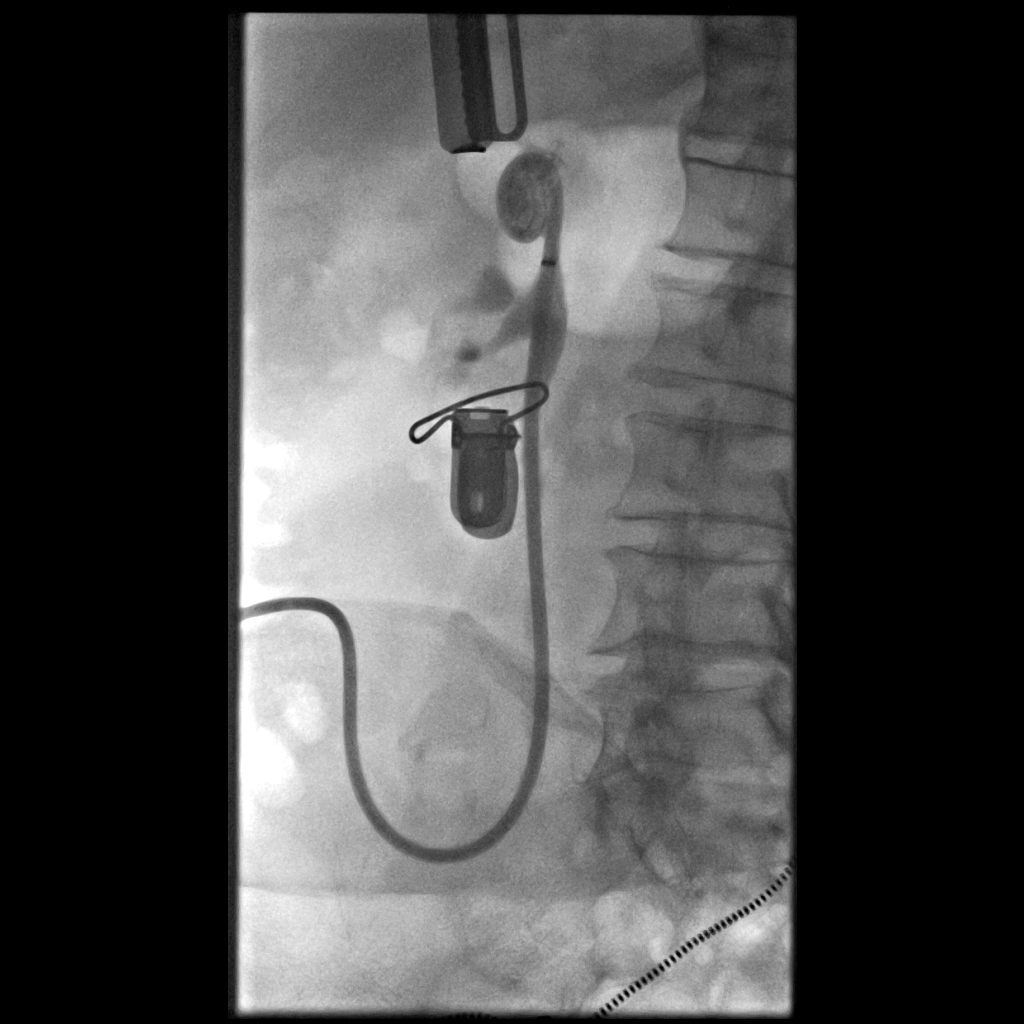
[im 5/5]
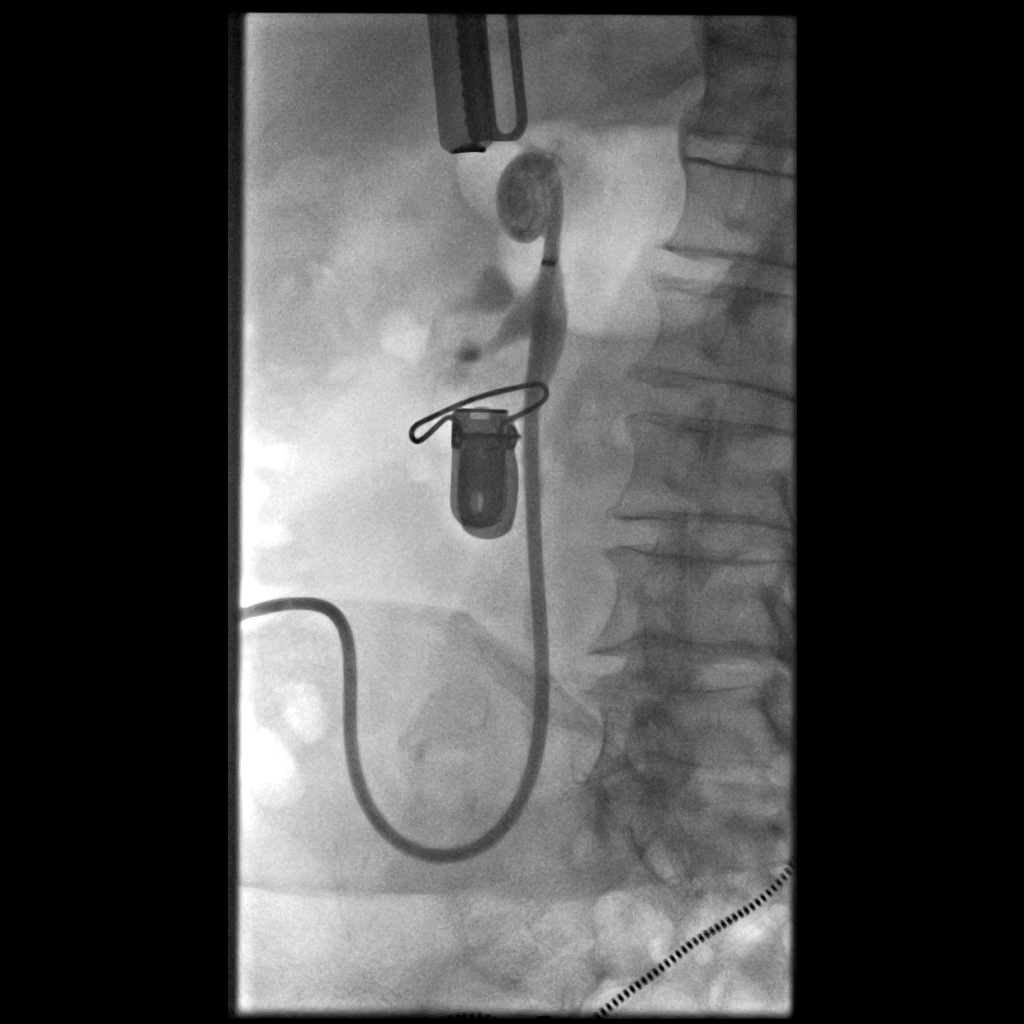

[5 of 5 positions shown; findings below may reference images not displayed]

CONTRAST:  20 mL Isovue-RVV administered into the collecting system

FLUOROSCOPY TIME:  3 minutes 30 seconds

COMPLICATIONS:
None immediate
The external portion of the existing retrograde nephro ureteral
catheter was prepped and draped in the usual sterile fashion. A
sterile drape was applied covering the operative field. Maximum
barrier sterile technique with sterile gowns and gloves were used
for the procedure. A timeout was performed prior to the initiation
of the procedure.

A pre procedural spot fluoroscopic image was obtained after contrast
was injected via the existing nephrostomy catheter demonstrating
appropriate positioning within the renal pelvis. The existing
catheter was cut and cannulated with an Amplatz wire which was
coiled within the renal pelvis. Under intermittent fluoroscopic
guidance, the existing catheter was exchanged for a new 10.2 French
45 cm all-purpose drainage catheter. Contrast injection confirmed
appropriate positioning within the renal pelvis and a post exchange
fluoroscopic image was obtained. The catheter was locked. The
patient tolerated the procedure well without immediate
postprocedural complication.
FINDINGS: The existing right-sided retrograde nephroureteral catheter is
appropriately positioned and functioning.

After successful fluoroscopic guided exchange, the new nephro
ureteral catheter is coiled and locked within the right renal
pelvis.
IMPRESSION: Successful fluoroscopic guided exchange of right sided 10.2 French
45 cm retrograde nephro ureteral catheter.

## 2015-07-13 ENCOUNTER — Other Ambulatory Visit: Payer: Self-pay | Admitting: Urology

## 2015-07-13 ENCOUNTER — Ambulatory Visit (HOSPITAL_COMMUNITY)
Admission: RE | Admit: 2015-07-13 | Discharge: 2015-07-13 | Disposition: A | Discharge status: Home / Self Care | Payer: Medicare Other | Source: Ambulatory Visit | Attending: Urology | Admitting: Urology

## 2015-07-13 DIAGNOSIS — N135 Crossing vessel and stricture of ureter without hydronephrosis: Secondary | ICD-10-CM | POA: Insufficient documentation

## 2015-07-13 DIAGNOSIS — Z466 Encounter for fitting and adjustment of urinary device: Secondary | ICD-10-CM | POA: Insufficient documentation

## 2015-07-13 MED ORDER — IOPAMIDOL (ISOVUE-300) INJECTION 61%
50.0000 mL | Freq: Once | INTRAVENOUS | Status: AC | PRN
Start: 2015-07-13 — End: 2015-07-13
  Administered 2015-07-13: 5 mL via INTRAVENOUS

## 2015-07-19 ENCOUNTER — Encounter (INDEPENDENT_AMBULATORY_CARE_PROVIDER_SITE_OTHER): Payer: Medicare Other | Admitting: Ophthalmology

## 2015-07-19 DIAGNOSIS — H353114 Nonexudative age-related macular degeneration, right eye, advanced atrophic with subfoveal involvement: Secondary | ICD-10-CM | POA: Diagnosis not present

## 2015-07-19 DIAGNOSIS — H35033 Hypertensive retinopathy, bilateral: Secondary | ICD-10-CM | POA: Diagnosis not present

## 2015-07-19 DIAGNOSIS — I10 Essential (primary) hypertension: Secondary | ICD-10-CM | POA: Diagnosis not present

## 2015-07-19 DIAGNOSIS — H353221 Exudative age-related macular degeneration, left eye, with active choroidal neovascularization: Secondary | ICD-10-CM | POA: Diagnosis not present

## 2015-07-19 DIAGNOSIS — H43813 Vitreous degeneration, bilateral: Secondary | ICD-10-CM

## 2015-08-17 ENCOUNTER — Ambulatory Visit (HOSPITAL_COMMUNITY)
Admission: RE | Admit: 2015-08-17 | Discharge: 2015-08-17 | Disposition: A | Discharge status: Home / Self Care | Payer: Medicare Other | Source: Ambulatory Visit | Attending: Urology | Admitting: Urology

## 2015-08-17 ENCOUNTER — Other Ambulatory Visit: Payer: Self-pay | Admitting: Urology

## 2015-08-17 DIAGNOSIS — N135 Crossing vessel and stricture of ureter without hydronephrosis: Secondary | ICD-10-CM | POA: Diagnosis present

## 2015-08-17 IMAGING — XA IR CATHETER TUBE CHANGE
1 series · 1 of 1 positions shown · non-contrast
Comparison: 09/17/2013

:
INDICATION: History of cystectomy and ureteral stricture at the anastomosis of
the distal aspect of the right ureter an ileal conduit. Post
percutaneous percutaneous nephrolithotomy and placement of a
nephrostomy catheter with ultimate conversion of the nephrostomy
catheter to a retrograde nephro ureteral catheter. He presented
yesterday to the ED with flank pain and malaise, and was given
antibiotics.

EXAM:
FLUOROSCOPIC GUIDED RIGHT SIDED RETROGRADE NEPHROURETERAL CATHETER
EXCHANGE
TECHNIQUE: Informed written consent was obtained from the patient after a
discussion of the risks, benefits and alternatives to treatment.
Questions regarding the procedure were encouraged and answered. A
timeout was performed prior to the initiation of the procedure.

[Series 300: tube placements · 1 of 1 slices shown]
[im 1/1]
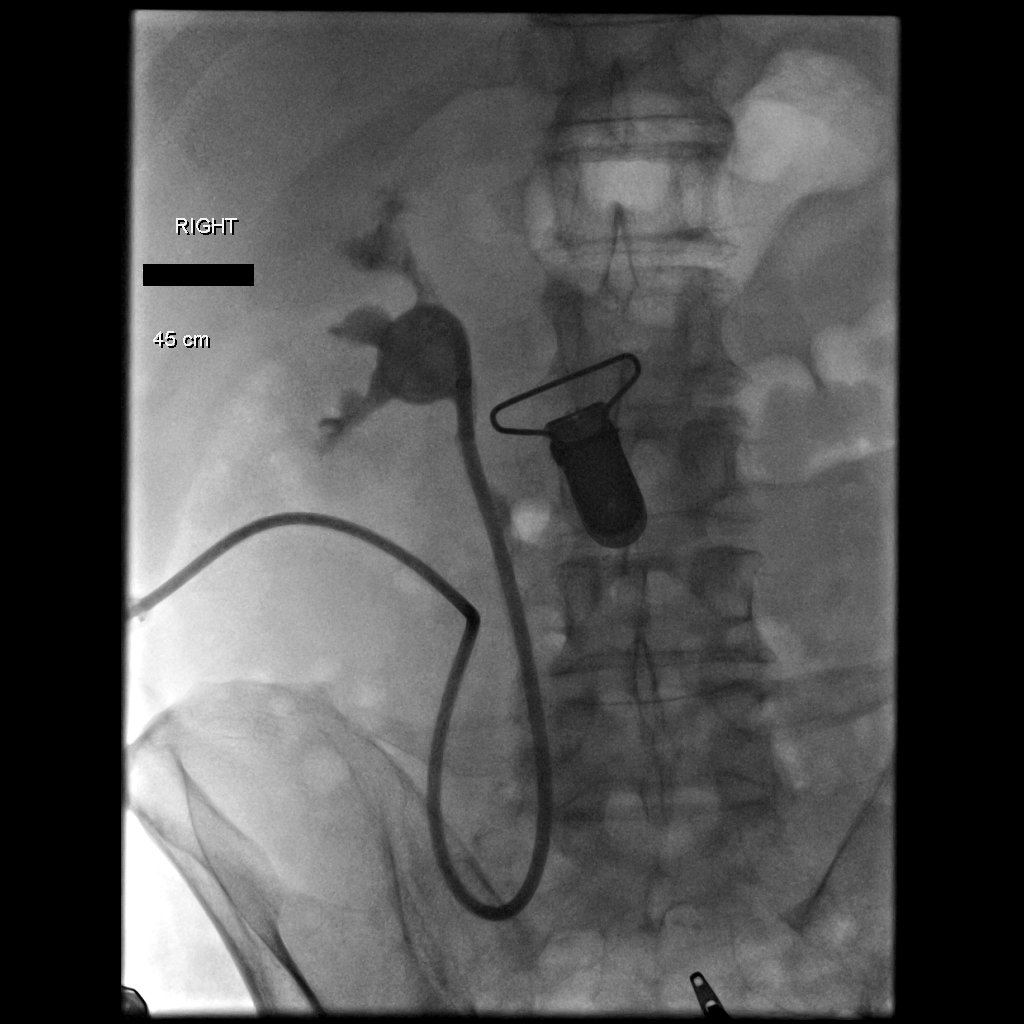

[1 of 1 positions shown; findings below may reference images not displayed]

CONTRAST:  15 mL Omnipaque 300
FLUOROSCOPY TIME:  1 min 54 seconds

COMPLICATIONS:
None immediate
The external portion of the existing retrograde nephro ureteral
catheter was prepped and draped in the usual sterile fashion. A
sterile drape was applied covering the operative field. Maximum
barrier sterile technique with sterile gowns and gloves were used
for the procedure. A timeout was performed prior to the initiation
of the procedure.

A pre procedural spot fluoroscopic image was obtained after contrast
was injected via the existing nephrostomy catheter demonstrating
appropriate positioning within the renal pelvis. There is initially
resistance to injection but this cleared. The patient had discomfort
with injection and the contrast did not flow freely. A small amount
purulent material returned through the catheter. The existing
catheter was cut and cannulated with an Amplatz wire which was
coiled within the renal pelvis. Under intermittent fluoroscopic
guidance, the existing catheter was exchanged for a new 10.2 French
45 cm all-purpose drainage catheter. Initially I tried to downsize
to a 25 cm catheter but this was to short. Contrast injection
confirmed
appropriate positioning within the renal pelvis and a post exchange
fluoroscopic image was obtained. The catheter was locked. The
patient tolerated the procedure well without immediate
postprocedural complication.
FINDINGS: There is obstruction of the previously placed nephro ureteral
catheter with pyonephrosis. After successful fluoroscopic guided
exchange, the new nephroureteral catheter is coiled and locked
within the right renal
pelvis.
IMPRESSION: Successful fluoroscopic guided exchange of right sided 10.2 French
45 cm retrograde nephroureteral catheter. Recommend Q 5 week
exchanges in order to avoid future occlusions.

## 2015-08-17 MED ORDER — IOPAMIDOL (ISOVUE-300) INJECTION 61%
50.0000 mL | Freq: Once | INTRAVENOUS | Status: AC | PRN
  Administered 2015-08-17: 5 mL

## 2015-08-17 NOTE — Procedures (Signed)
Successful right sided NUS exchange.   No immediate complications.   Ronny Bacon, MD Pager #: [phone]

## 2015-09-06 ENCOUNTER — Encounter (INDEPENDENT_AMBULATORY_CARE_PROVIDER_SITE_OTHER): Payer: Medicare Other | Admitting: Ophthalmology

## 2015-09-06 DIAGNOSIS — H353114 Nonexudative age-related macular degeneration, right eye, advanced atrophic with subfoveal involvement: Secondary | ICD-10-CM

## 2015-09-06 DIAGNOSIS — I10 Essential (primary) hypertension: Secondary | ICD-10-CM

## 2015-09-06 DIAGNOSIS — H353221 Exudative age-related macular degeneration, left eye, with active choroidal neovascularization: Secondary | ICD-10-CM | POA: Diagnosis not present

## 2015-09-06 DIAGNOSIS — H43813 Vitreous degeneration, bilateral: Secondary | ICD-10-CM

## 2015-09-06 DIAGNOSIS — H35033 Hypertensive retinopathy, bilateral: Secondary | ICD-10-CM | POA: Diagnosis not present

## 2015-09-20 IMAGING — XA IR CATHETER TUBE CHANGE
1 series · 2 of 2 positions shown · non-contrast
Comparison: 10/29/2013

CLINICAL DATA: History of cystectomy and ureteral stricture at the
anastomosis of
the distal aspect of the right ureter an ileal conduit. Post
percutaneous percutaneous nephrolithotomy and placement of a
nephrostomy catheter with ultimate conversion of the nephrostomy
catheter to a retrograde nephro ureteral catheter.

EXAM:
FLUOROSCOPIC GUIDED RIGHT SIDED RETROGRADE NEPHROURETERAL CATHETER
EXCHANGE
TECHNIQUE: Informed written consent was obtained from the patient after a
discussion of the risks, benefits and alternatives to treatment.
Questions regarding the procedure were encouraged and answered. A
timeout was performed prior to the initiation of the procedure.

[Series 300: tube placements · 2 of 2 slices shown]
[im 1/2]
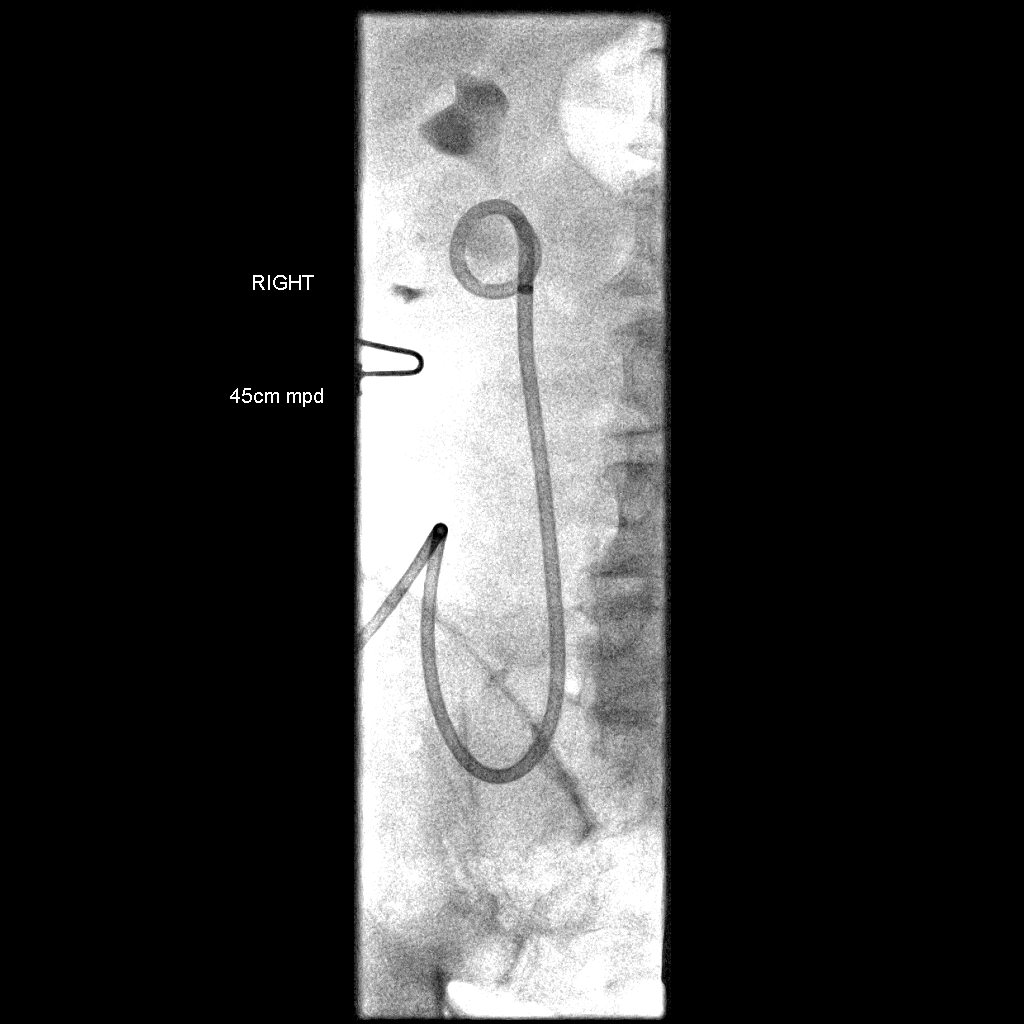
[im 2/2]
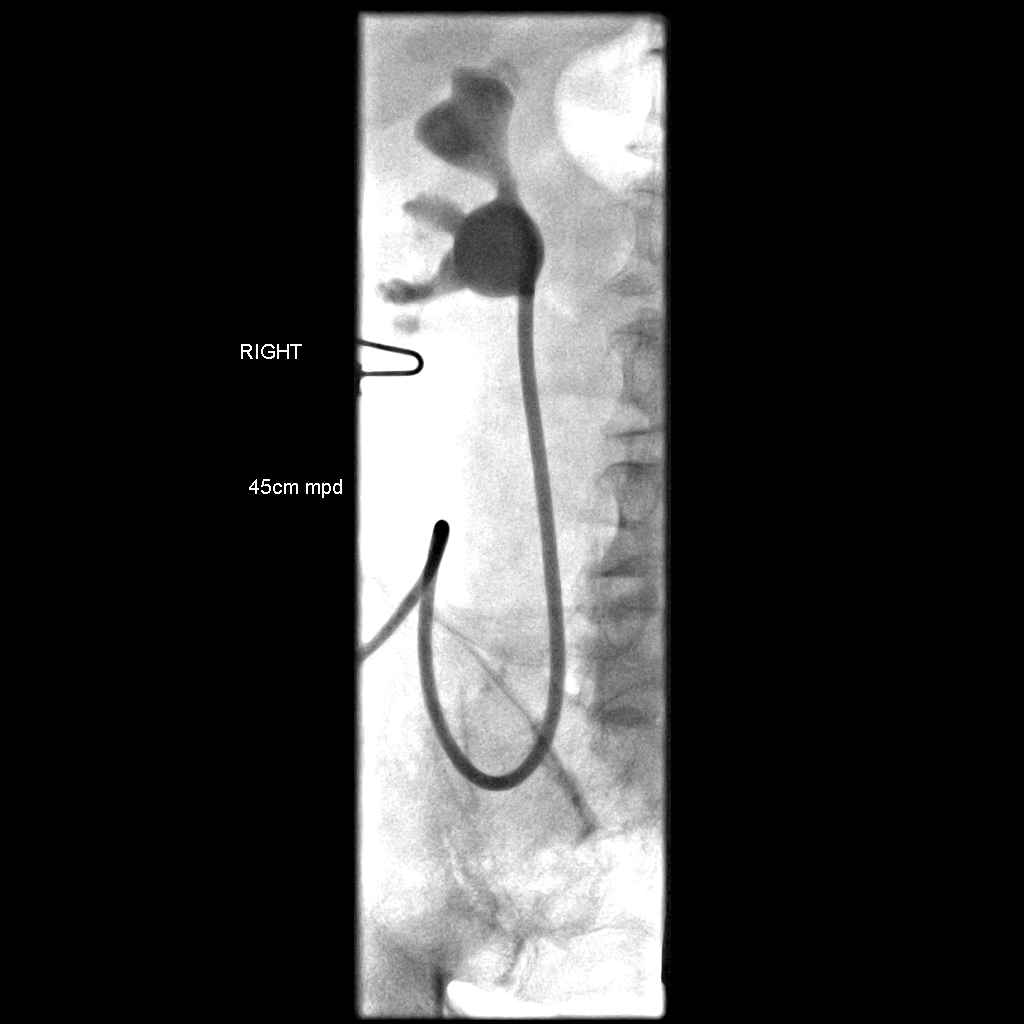

[2 of 2 positions shown; findings below may reference images not displayed]

CONTRAST: 10mL Omnipaque 300
FLUOROSCOPY TIME: 30seconds

COMPLICATIONS:
None immediate
The external portion of the existing retrograde nephroureteral
catheter was prepped and draped in the usual sterile fashion. A
sterile drape was applied covering the operative field. Maximum
barrier sterile technique with sterile gowns and gloves were used
for the procedure. A timeout was performed prior to the initiation
of the procedure.

A pre procedural spot fluoroscopic image was obtained after contrast
was injected via the existing nephrostomy catheter demonstrating
appropriate positioning within the renal pelvis. There is initially
resistance to injection but this cleared.   The existing
catheter was cut and cannulated with an Amplatz wire which was
coiled within the renal pelvis. Under intermittent fluoroscopic
guidance, the existing catheter was exchanged for a new 10.2 French
45 cm all-purpose drainage catheter.  Contrast injection confirmed
appropriate positioning within the renal pelvis and a post exchange
fluoroscopic image was obtained. The catheter was locked. The
patient tolerated the procedure well without immediate
postprocedural complication.
FINDINGS: Technically successful fluoroscopic guided
exchange.  A new nephroureteral catheter is coiled and locked
within the right renal
pelvis.
IMPRESSION: Technically successful fluoroscopic guided exchange of right sided
10.2 French
45 cm retrograde nephroureteral catheter.

## 2015-09-21 ENCOUNTER — Other Ambulatory Visit: Payer: Self-pay | Admitting: Urology

## 2015-09-21 ENCOUNTER — Ambulatory Visit (HOSPITAL_COMMUNITY)
Admission: RE | Admit: 2015-09-21 | Discharge: 2015-09-21 | Disposition: A | Discharge status: Home / Self Care | Payer: Medicare Other | Source: Ambulatory Visit | Attending: Urology | Admitting: Urology

## 2015-09-21 DIAGNOSIS — N135 Crossing vessel and stricture of ureter without hydronephrosis: Secondary | ICD-10-CM

## 2015-09-21 DIAGNOSIS — Z436 Encounter for attention to other artificial openings of urinary tract: Secondary | ICD-10-CM | POA: Insufficient documentation

## 2015-09-21 NOTE — Procedures (Signed)
Successful right sided NUS exchange.   No immediate complications.   Ronny Bacon, MD Pager #: [phone]

## 2015-10-25 IMAGING — XA IR NEPHROSTOGRAM*R*
1 series · 2 of 2 positions shown · IV contrast (omnipaque)
Comparison: none

CLINICAL DATA: [AGE] male with history of bladder cancer
status post cystectomy and bilateral ureteral diversion via right
lower quadrant diverting ileostomy. History of right distal ureteral
stricture at the ureteral enteric anastomosis. Patient currently has
a retrograde nephro ureteral tube via the right lower quadrant
ileostomy.

EXAM:
RIGHT NEPHROSTOGRAM
Date: 01/06/2014
PROCEDURE:
1. Antegrade nephrostogram
2. Failed exchange of nephroureteral tube
ANESTHESIA/SEDATION:
None required.
FLUOROSCOPY TIME:  13 minutes 18 seconds
CONTRAST:  50 mL Omnipaque 300
TECHNIQUE: Informed consent was obtained from the patient following explanation
of the procedure, risks, benefits and alternatives. The patient
understands, agrees and consents for the procedure. All questions
were addressed. A time out was performed.

[Series 300: tube placements · 2 of 2 slices shown]
[im 1/2]
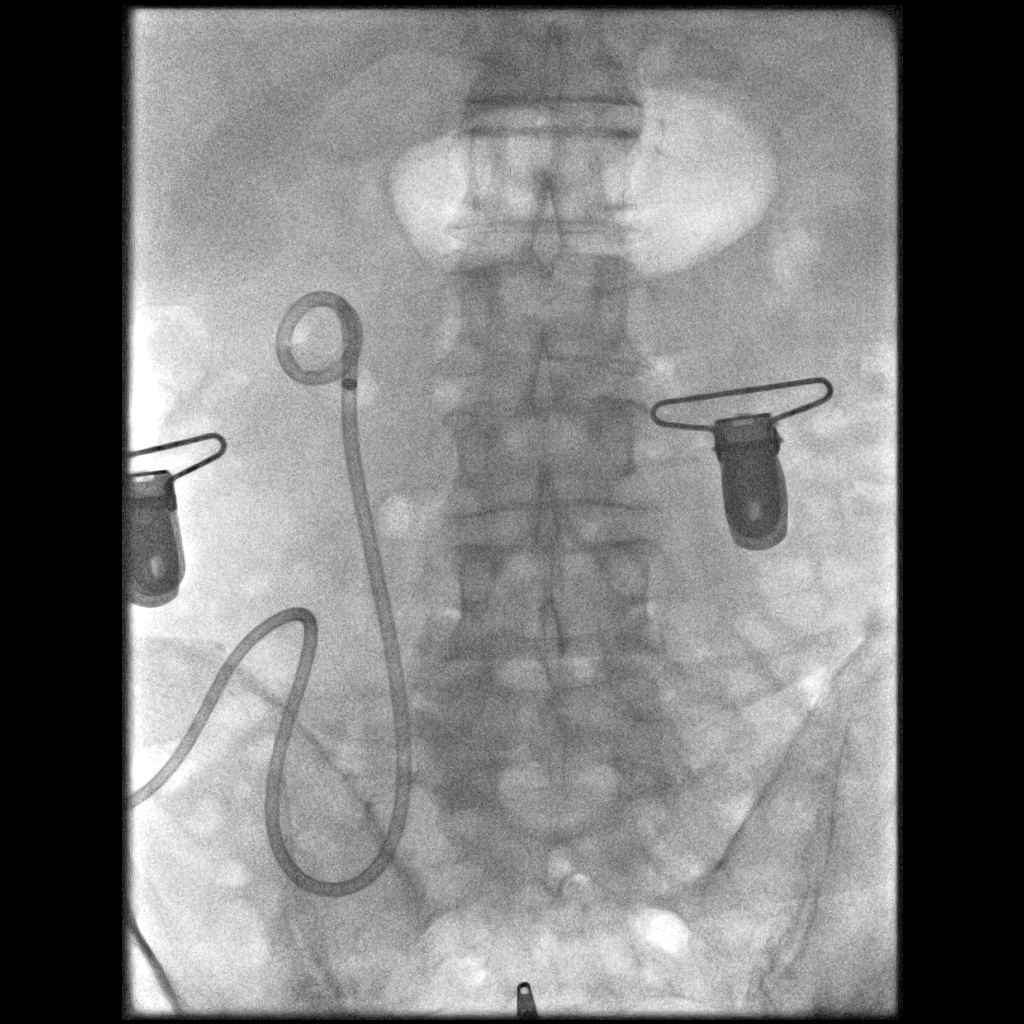
[im 2/2]
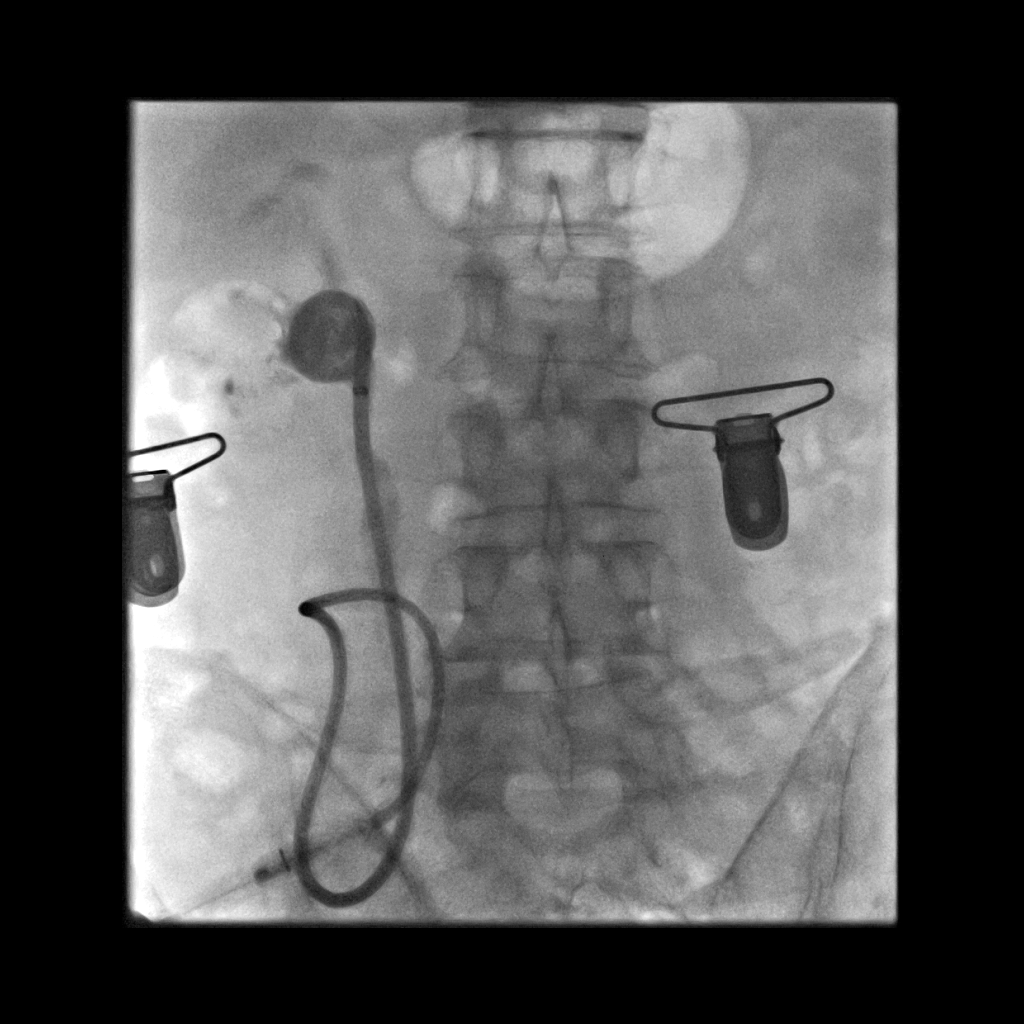

[2 of 2 positions shown; findings below may reference images not displayed]

Maximal barrier sterile technique utilized including caps, mask,
sterile gowns, sterile gloves, large sterile drape, hand hygiene,
and Betadine skin prep.

A gentle injection of contrast material through the existing tube
confirmed its location within the renal pelvis. Of note, the tube is
very difficult to inject. The tube was transected and an attempt was
made to pass a an Amplatz wire through the tube. However, the inner
portion of the tube is encrusted. The wire could not be advanced
through the tube.

Therefore, a stiff glidewire was used to navigate through the tube.
The tube was then removed over the glidewire. Unfortunately, due to
0 fraction within the tube and a tortuous course of the diverting
ileostomy, the stiff glidewire back out of the renal collecting
system and into the ileal loop during this maneuver. Access was lost
to the renal collecting system. A copy catheter was introduced over
a glidewire and attempts were made to Re cannulate the right
ureteral orifice. This was unsuccessful. Contrast material was then
injected into the ileal loop pressure arising the system resulting
in reflux into the patent left ureter and renal collecting system.
Unfortunately, the origin of the stenosed right ureter could not be
identified.

Additional attempts were made with different catheters and wires to
cannulate the ureteral orifice. Ultimately, this was unsuccessful.

COMPLICATIONS:
SIR level C: Requires therapy, minor hospitalization (< 48 hrs).

Access to the right collecting system was lost during the procedure.
This will necessitate a repeat right percutaneous nephrostomy tube
placement and repeat conversion to retrograde nephroureteral tube.
IMPRESSION: Unsuccessful attempted exchange of retrograde nephroureteral tube
through the right lower quadrant diverting ileostomy. Unfortunately,
the tube was partially encrusted and required a glidewire to
traverse the tube. Access to the renal collecting system was lost
during tube removal over the wire.

PLAN:
1. Patient will require repeat right percutaneous nephrostomy. Given
the decompressed state of the kidney presently, this would be better
performed in the morning after the kidney has developed some degree
of hydronephrosis. I explained today's complication and the plan
moving forward to both the patient and his son who understand.
2. The patient was treated with 500 mg ciprofloxacin orally today
and was scheduled to return tomorrow morning to interventional
Radiology.

## 2015-10-26 ENCOUNTER — Ambulatory Visit (HOSPITAL_COMMUNITY)
Admission: RE | Admit: 2015-10-26 | Discharge: 2015-10-26 | Disposition: A | Discharge status: Home / Self Care | Payer: Medicare Other | Source: Ambulatory Visit | Attending: Urology | Admitting: Urology

## 2015-10-26 ENCOUNTER — Encounter (HOSPITAL_COMMUNITY): Payer: Self-pay | Admitting: Interventional Radiology

## 2015-10-26 ENCOUNTER — Other Ambulatory Visit: Payer: Self-pay | Admitting: Urology

## 2015-10-26 DIAGNOSIS — N135 Crossing vessel and stricture of ureter without hydronephrosis: Secondary | ICD-10-CM

## 2015-10-26 DIAGNOSIS — Z466 Encounter for fitting and adjustment of urinary device: Secondary | ICD-10-CM | POA: Insufficient documentation

## 2015-10-26 HISTORY — PX: IR GENERIC HISTORICAL: IMG1180011

## 2015-10-26 IMAGING — XA IR US GUIDANCE
2 series · 9 of 9 positions shown · non-contrast
Comparison: none

CLINICAL DATA: Prostate carcinoma, cystectomy. Ureteral obstruction
requiring long-term catheter drainage to prevent hydronephrosis.

EXAM:
RIGHT PERCUTANEOUS NEPHROSTOMY UNDER ULTRASOUND AND FLUOROSCOPIC
GUIDANCE
RIGHT NEPHROURETERAL STENT PLACEMENT
FLUOROSCOPY TIME:  5 MIN 12 seconds
TECHNIQUE: The procedure, risks (including but not limited to bleeding,
infection, organ damage ), benefits, and alternatives were explained
to the patient. Questions regarding the procedure were encouraged
and answered. The patient understands and consents to the procedure.
Right flank region prepped with Betadine, draped in usual sterile
fashion, infiltrated locally with 1% lidocaine.As antibiotic
prophylaxis, Cipro 400 mg was ordered pre-procedure and administered
intravenously within one hour of incision.

[Series 1: fl - angio · 4 of 74 frames shown]
[frame 12/74]
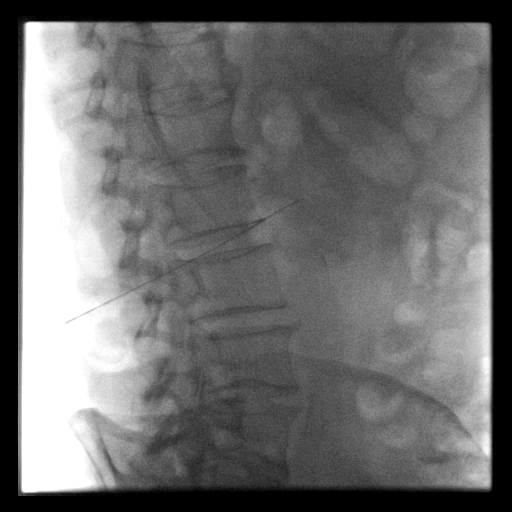
[frame 38/74]
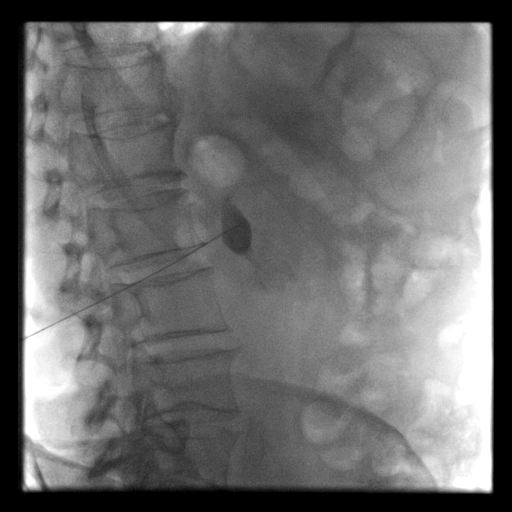
[frame 63/74]
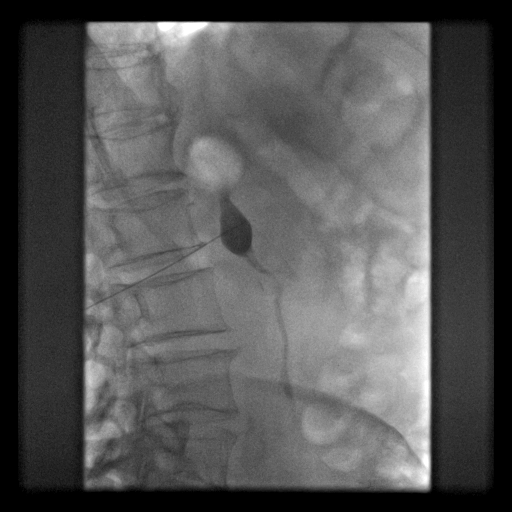
[frame 74/74]
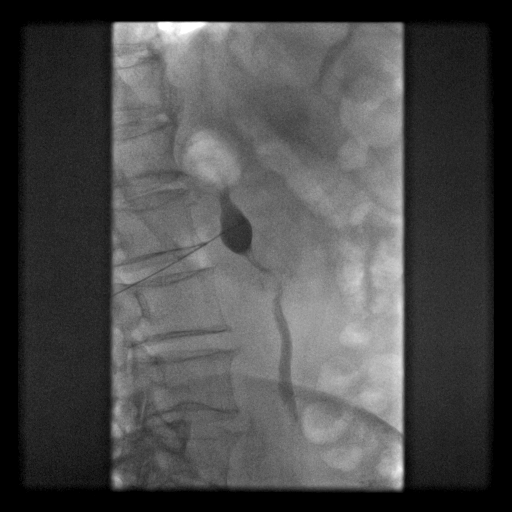

[Series 300: ir ext nephroureteral cath exchange · 5 of 5 slices shown]
[im 1/5]
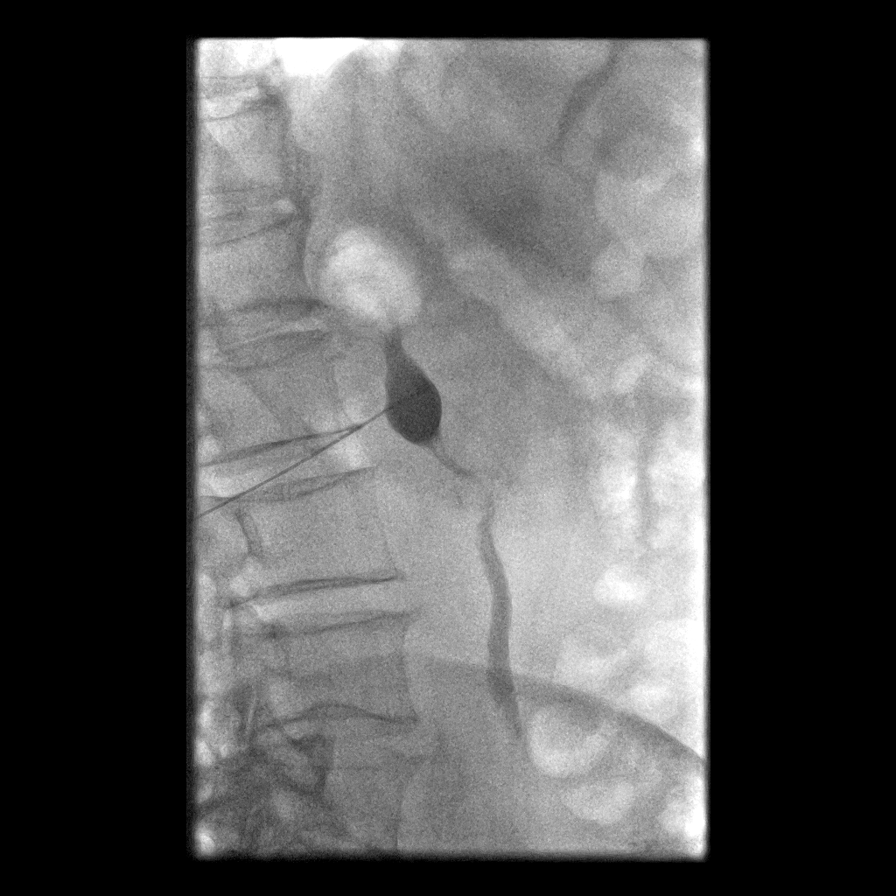
[im 2/5]
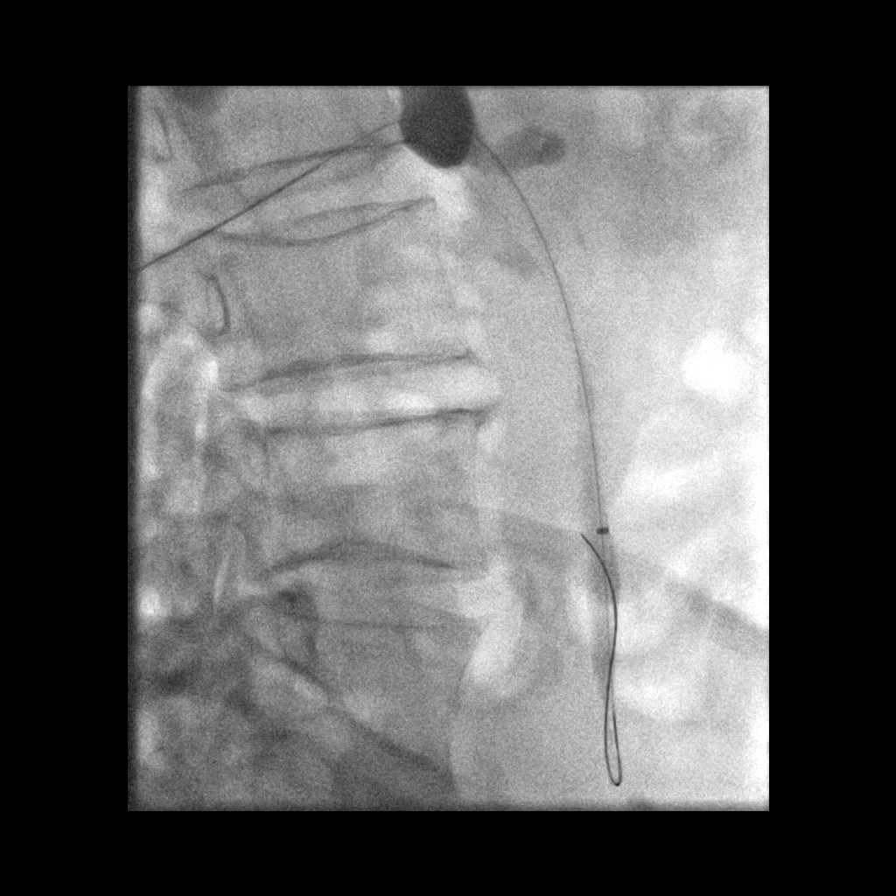
[im 3/5]
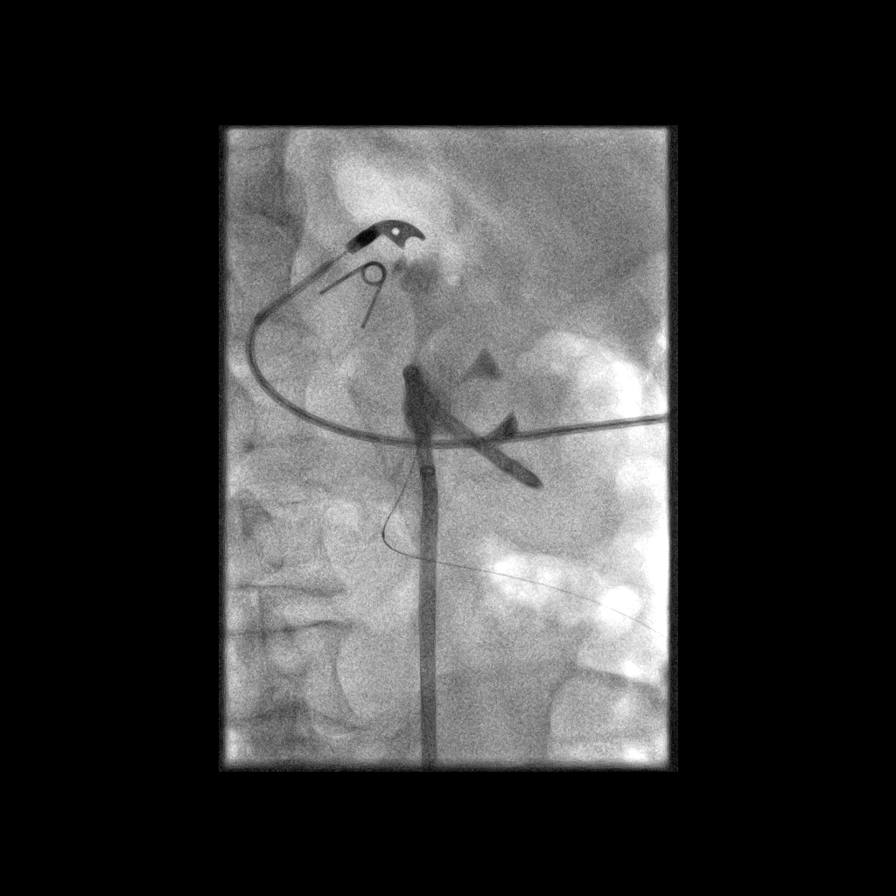
[im 4/5]
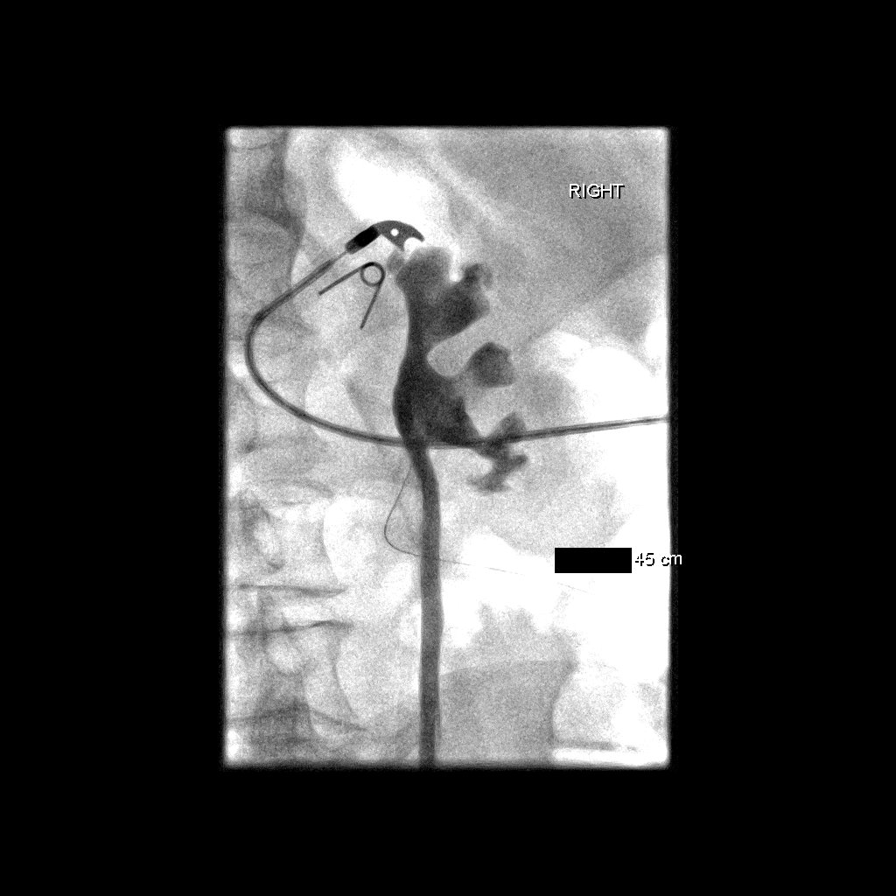
[im 5/5]
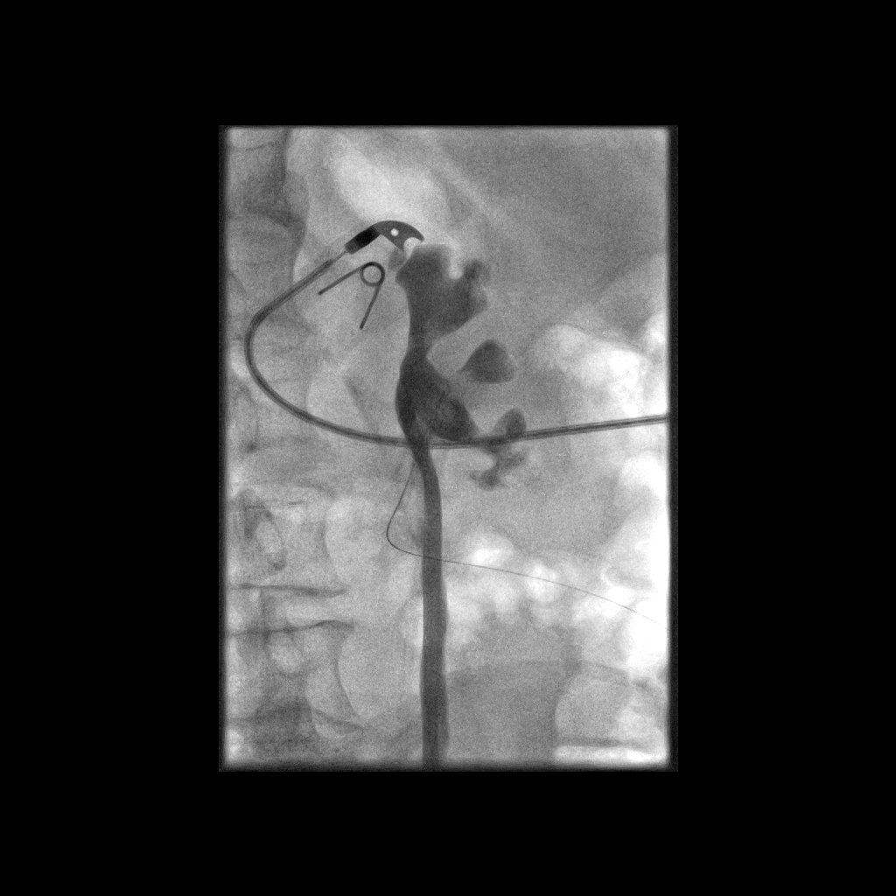

[9 of 9 positions shown; findings below may reference images not displayed]

Intravenous Fentanyl and Versed were administered as conscious
sedation during continuous cardiorespiratory monitoring by the
radiology RN, with a total moderate sedation time of 25 of minutes.

Under real-time ultrasound guidance, a 21-gauge trocar needle was
advanced into a posterior lower pole calyx. Ultrasound image
documentation was saved. Urine could be aspirated through the
needle. Needle was exchanged over a 018 guidewire for transitional
dilator. Contrast injection confirmed appropriate positioning.
Catheter was exchanged over a guidewire for a 5 French glide
catheter, advanced across the distal ureteral obstruction, through
the ileal conduit, out the ostomy. The 018 wire was left as a safety
wire. The angiographic catheter was exchanged for a 12 French 45 cm
pigtail catheter, advanced retrograde as 88 nephro ureteral drain
out the ostomy, f If ormed centrally within the right renal
collecting system. Contrast injection confirms appropriate
positioning and patency. Catheter secured externally and placed into
external drain bag. The percutaneous safety wire was removed. No
immediate complication.
IMPRESSION: 1. Technically successful right percutaneous nephrostomy and
retrograde 12 French nephroureteral catheter placement.

## 2015-10-26 MED ORDER — IOPAMIDOL (ISOVUE-300) INJECTION 61%
10.0000 mL | Freq: Once | INTRAVENOUS | Status: AC | PRN
  Administered 2015-10-26: 10 mL

## 2015-11-07 ENCOUNTER — Encounter: Payer: Self-pay | Admitting: Nurse Practitioner

## 2015-11-08 ENCOUNTER — Encounter (INDEPENDENT_AMBULATORY_CARE_PROVIDER_SITE_OTHER): Payer: Medicare Other | Admitting: Ophthalmology

## 2015-11-08 DIAGNOSIS — I10 Essential (primary) hypertension: Secondary | ICD-10-CM

## 2015-11-08 DIAGNOSIS — H353114 Nonexudative age-related macular degeneration, right eye, advanced atrophic with subfoveal involvement: Secondary | ICD-10-CM

## 2015-11-08 DIAGNOSIS — H43813 Vitreous degeneration, bilateral: Secondary | ICD-10-CM | POA: Diagnosis not present

## 2015-11-08 DIAGNOSIS — H35033 Hypertensive retinopathy, bilateral: Secondary | ICD-10-CM

## 2015-11-08 DIAGNOSIS — H353221 Exudative age-related macular degeneration, left eye, with active choroidal neovascularization: Secondary | ICD-10-CM | POA: Diagnosis not present

## 2015-11-22 IMAGING — XA IR CATHETER TUBE CHANGE
1 series · 10 of 10 positions shown · non-contrast
Comparison: none

CLINICAL DATA: [AGE] gentleman with a history of bladder
cancer. He has had cystectomy and has a urostomy. He presents for
routine exchange of a right nephro ureteral tube.

A previous attempt at exchange through the urostomy failed, with
loss of access. The result was required access via antegrade
percutaneous nephrostomy.
EXAM:
FLUOROSCOPIC GUIDED EXCHANGE OF RIGHT NEPHRO URETEROSTOMY TUBE VIA
THE PATIENT'S UROSTOMY.
FLUOROSCOPY TIME:  7 min, 42 seconds
TECHNIQUE: The procedure risks, benefits, and alternatives were explained to
the patient. Questions regarding the procedure were encouraged and
answered. The patient understands and consents to the procedure.

[Series 300: tube placements · 10 of 10 slices shown]
[im 1/10]
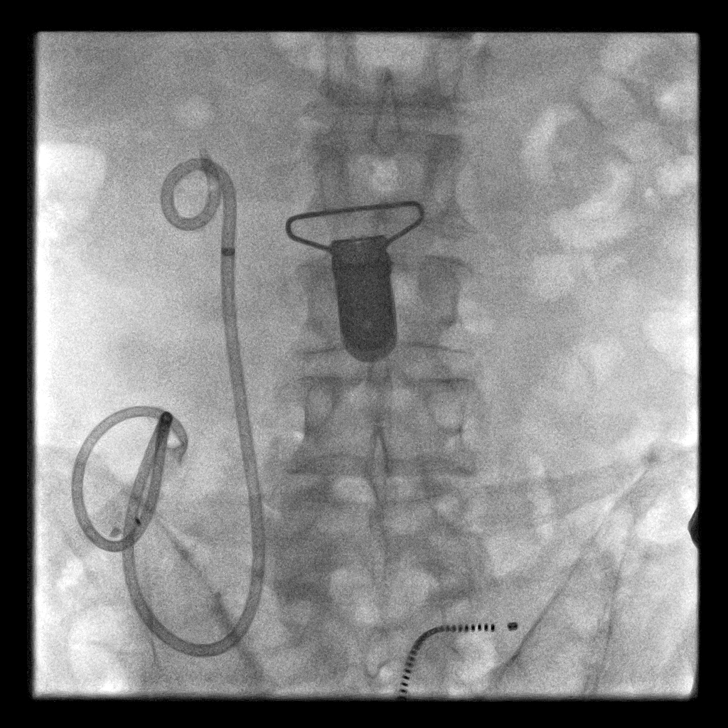
[im 2/10]
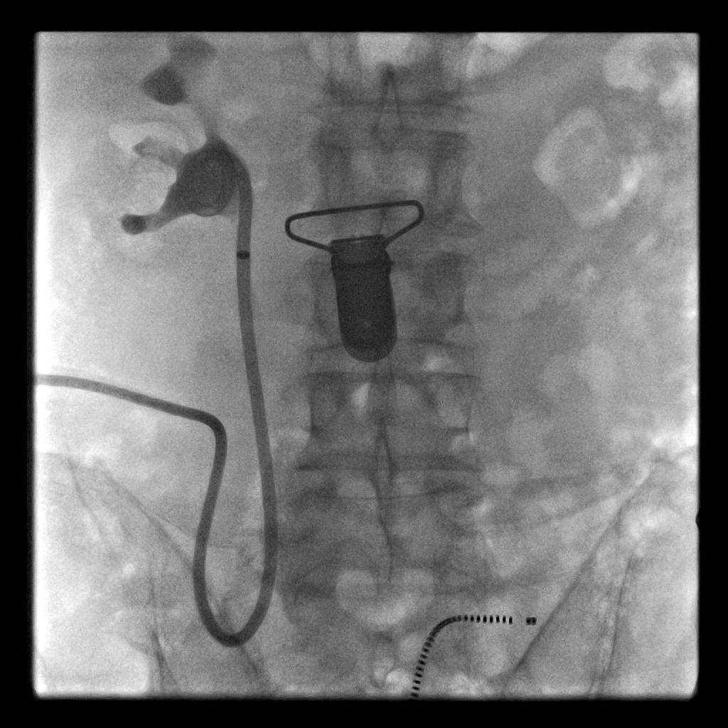
[im 3/10]
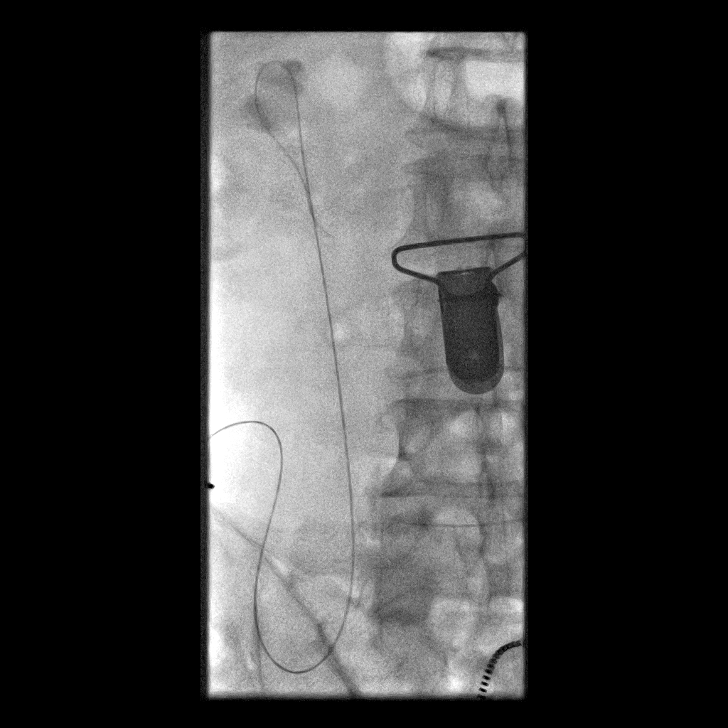
[im 4/10]
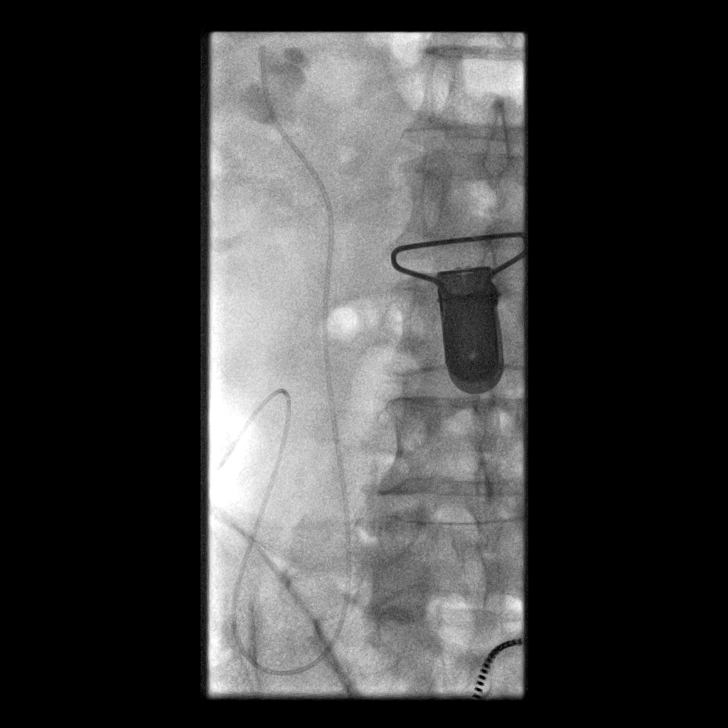
[im 5/10]
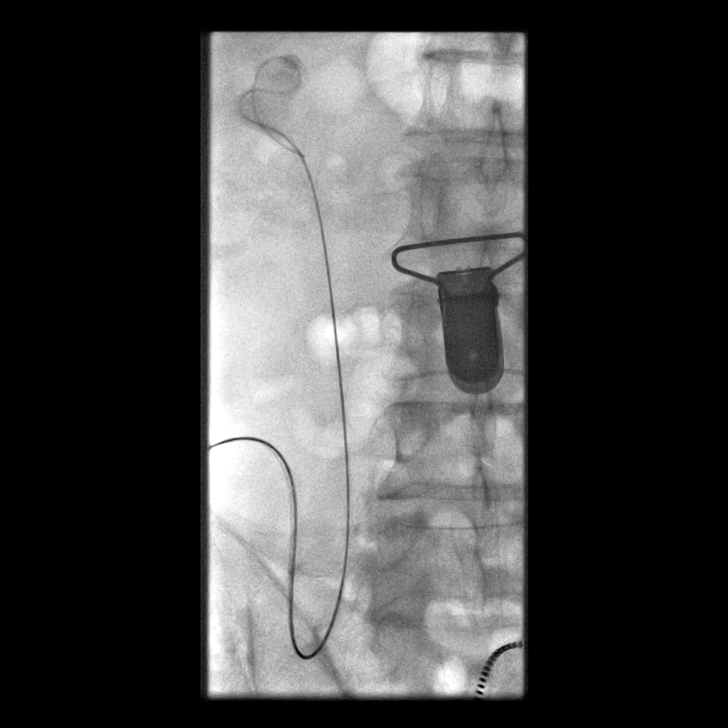
[im 6/10]
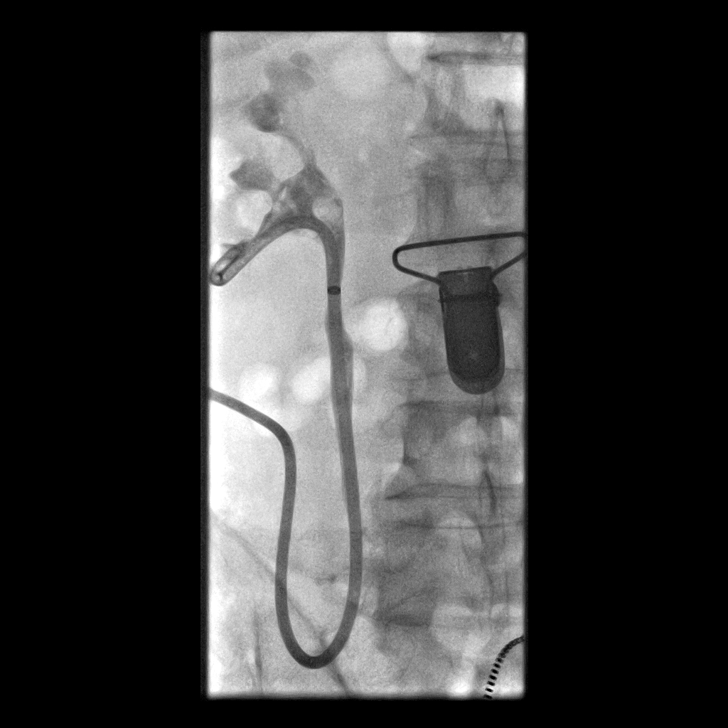
[im 7/10]
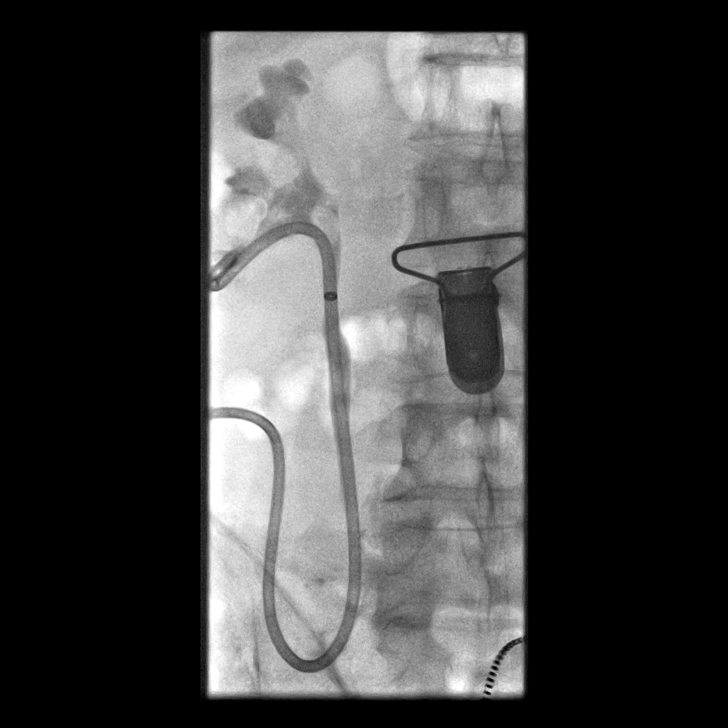
[im 8/10]
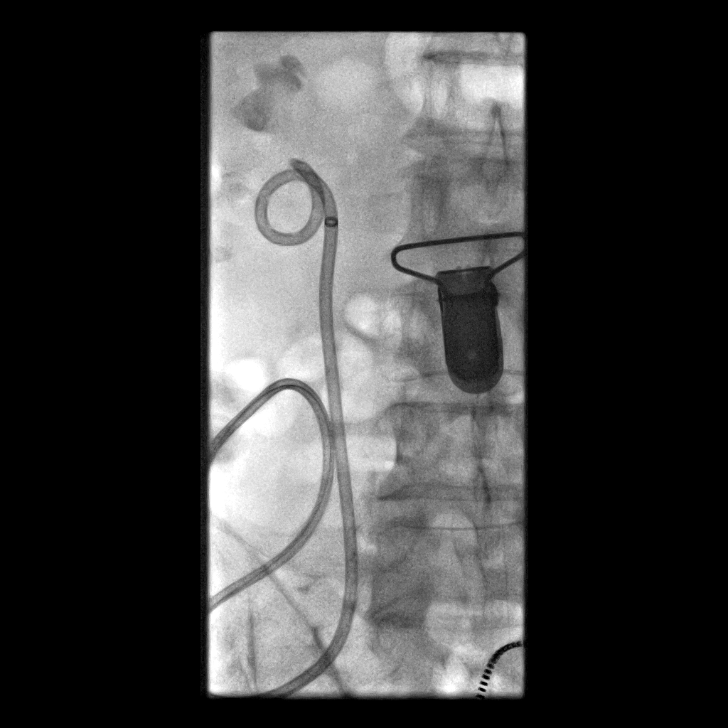
[im 9/10]
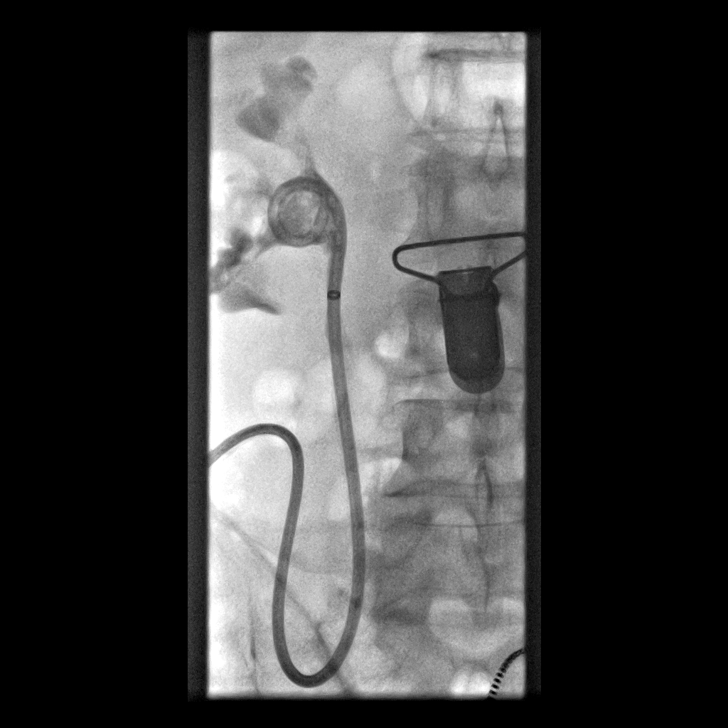
[im 10/10]
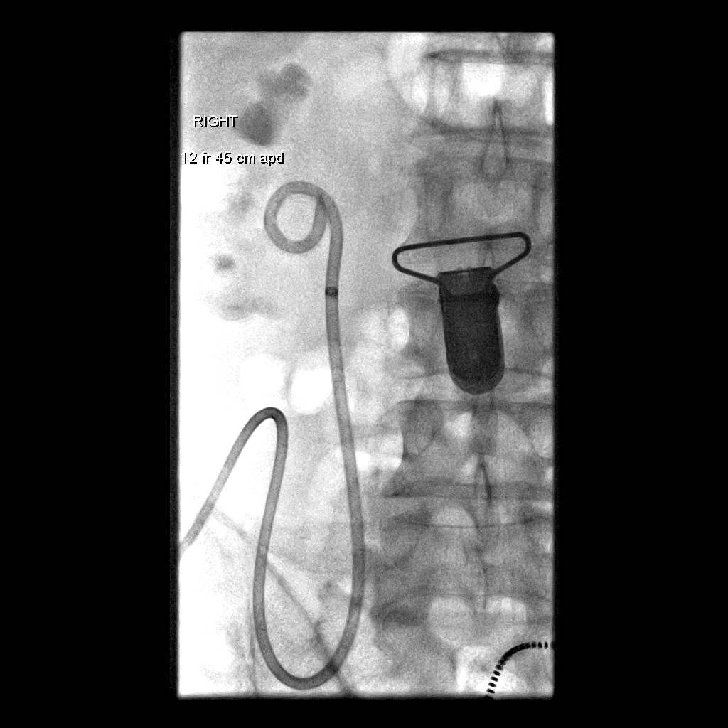

[10 of 10 positions shown; findings below may reference images not displayed]

Small amount of contrast confirmed position of the indwelling tube
within the right collecting system.

A standard Glidewire was advanced through the indwelling tube after
ligating the tube. The Glidewire was used to cannulate the encrusted
tip of the indwelling tube. The Glidewire was successful, though
there was some friction on removing the tube. A sterile clamp was
used to maintain the Glidewire access on the pin-pull removal of the
catheter.

Once the catheter was removed, a 4 French straight tip glide cath
was advanced to the collecting system of the right kidney. The
standard Glidewire was then removed.

An Amplatz wire was then advanced through the glide cath to the
collecting system after gently looping the end of the wire for
safety purposes. This also increase the length of stiff wire within
the tract.

The glide cath was removed from the Amplatz wire, and the Amplatz
wire was used as the rail for placement of the new 45 cm 12 French
pigtail catheter.

The pigtail catheter was formed in the collecting system and
contrast was used to confirm position.

No complications were encountered. Although no significant blood
loss was encountered, the patient did have some blood-tinged urine.

Patient tolerated the procedure well and remained hemodynamically
stable throughout.

COMPLICATIONS:
None
FINDINGS: Initial images demonstrate indwelling nephrostomy tube in the
collecting system of the right kidney.

Images during the case demonstrate fluoroscopically guided exchange
of 45 cm 12 French pigtail nephro ureterostomy.

The patient has tortuous surgical anatomy, with redundancy of the
urostomy and a low implantation of the ureter.
IMPRESSION: Status post exchange of patient's indwelling right-sided nephro
ureteral tube via the patient's urostomy.

The patient has significant tortuosity of his surgical anatomy,
making the exchange difficult. Successful steps detailed above.

## 2015-11-23 ENCOUNTER — Encounter: Payer: Medicare Other | Admitting: Nurse Practitioner

## 2015-11-23 IMAGING — DX DG CHEST 1V PORT
1 series · 1 of 1 positions shown · non-contrast
Comparison: 07/29/2013

CLINICAL DATA: c/o of near syncope pt states "got swimmy headed",
lowered himself to the ground, EMS report he was mildly pale, family
remark on slight slurred speech after the incidence, c/o of vomiting
as well, of note patient had his Urostomy Schertz replaced yesterday,
denies any problems with that. Pt denies any pain at this time, in
NAD.

EXAM:
PORTABLE CHEST - 1 VIEW

[chest ap]
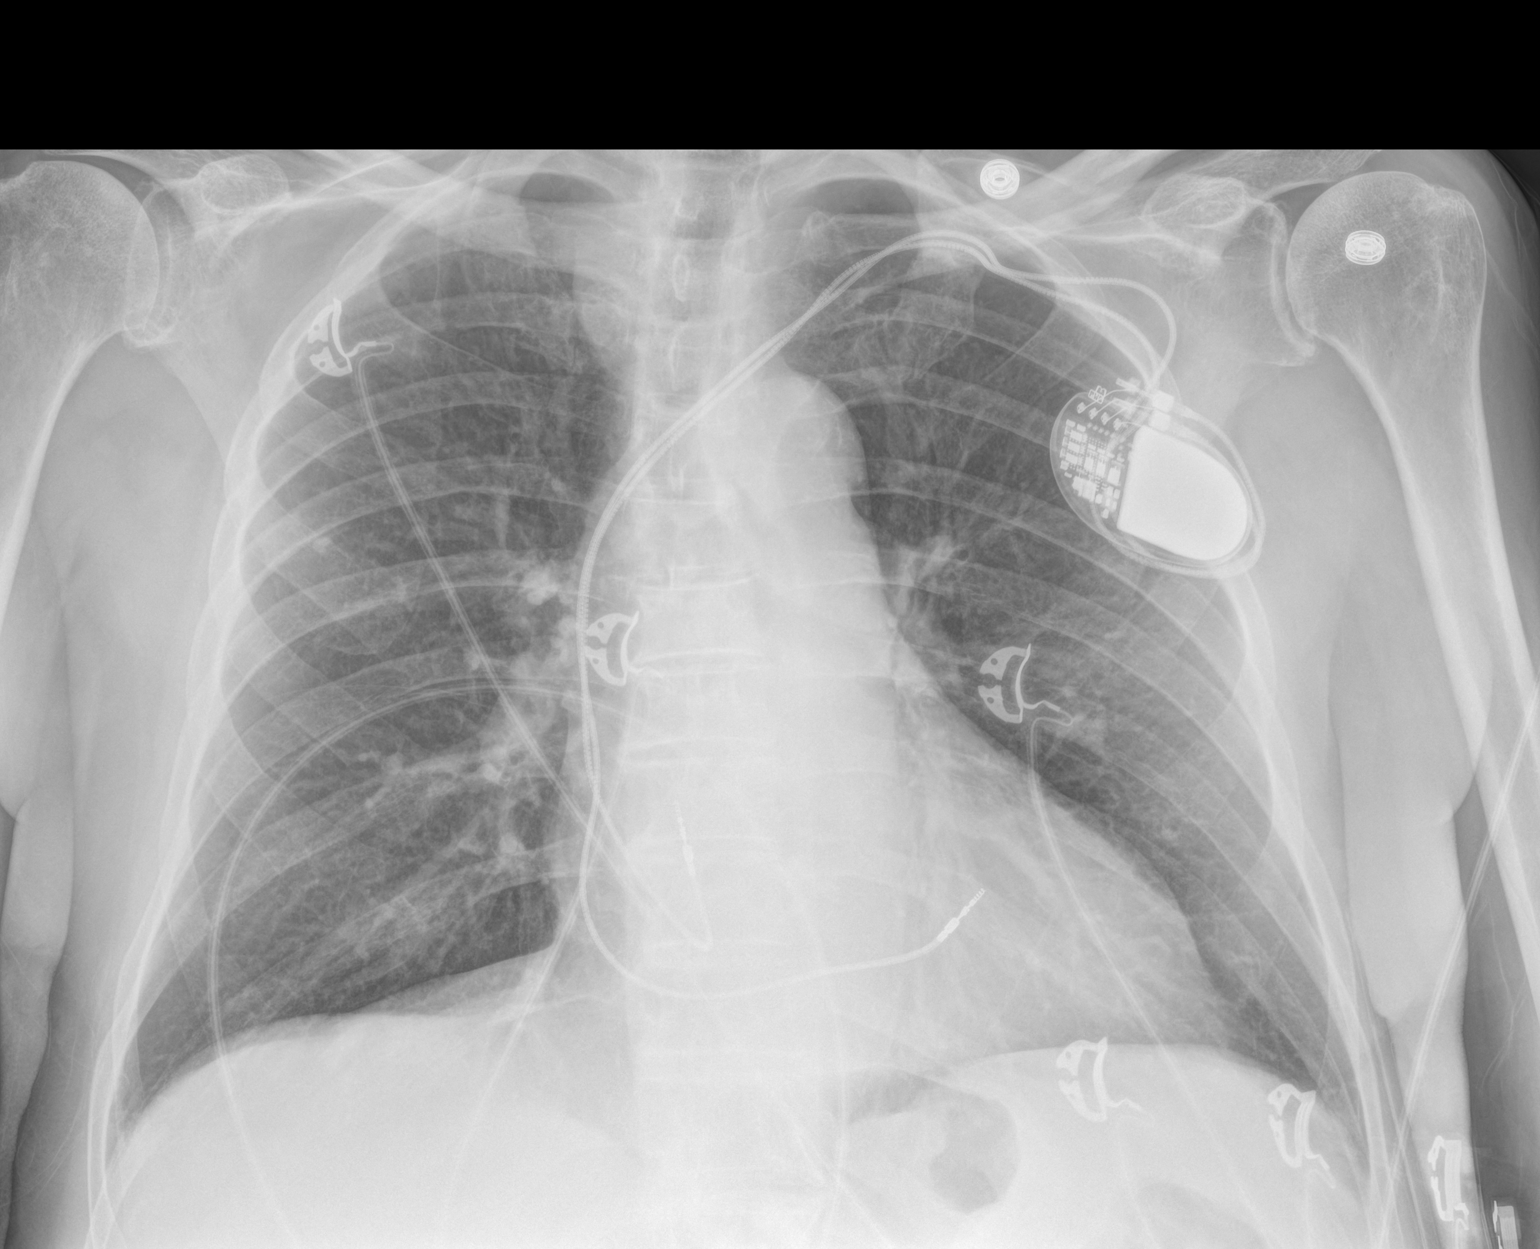

[1 of 1 positions shown; findings below may reference images not displayed]

FINDINGS: Pacer with leads at right atrium and right ventricle. No lead
discontinuity. Midline trachea. Mild cardiomegaly. Mediastinal
contours otherwise within normal limits. No pleural effusion or
pneumothorax. No congestive failure. Clear lungs.
IMPRESSION: Mild cardiomegaly, without acute disease.

## 2015-11-24 IMAGING — CT CT RENAL STONE PROTOCOL
2 of 3 series · 16 of 34 positions shown, 18 images · non-contrast
Comparison: 07/28/2013

CLINICAL DATA: Fever, elevated WBC, status post ureteral stent
exchange, evaluate for left side kidney stones/obstruction, status
post cystectomy and ileal conduit, bladder cancer, appendix

EXAM:
CT ABDOMEN AND PELVIS WITHOUT CONTRAST
TECHNIQUE: Multidetector CT imaging of the abdomen and pelvis was performed
following the standard protocol without IV contrast.

[Series 4: lung windows · axial · 0.80mm/px · z∈[-160,-76]mm · 13 of 20 slices shown, 15 images]
[im 2/20  soft-tissue]
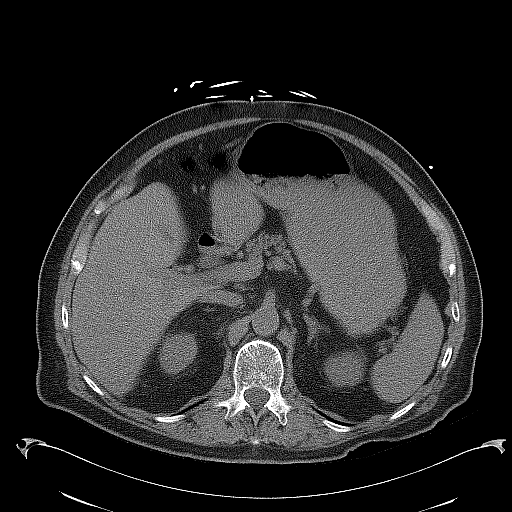
[im 2/20  bone]
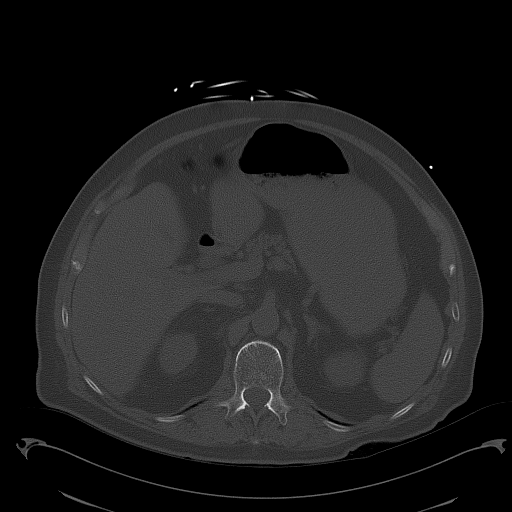
[im 4/20  soft-tissue]
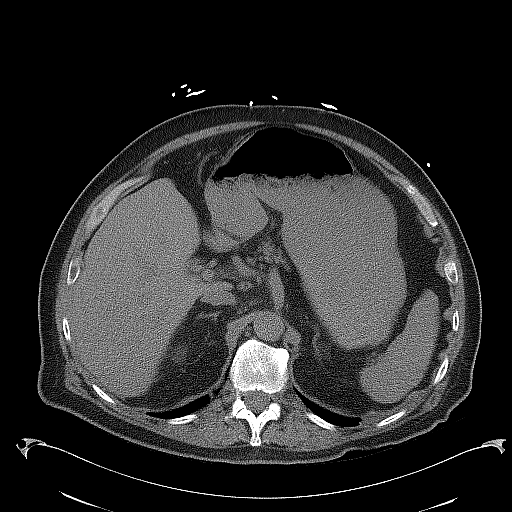
[im 5/20  soft-tissue]
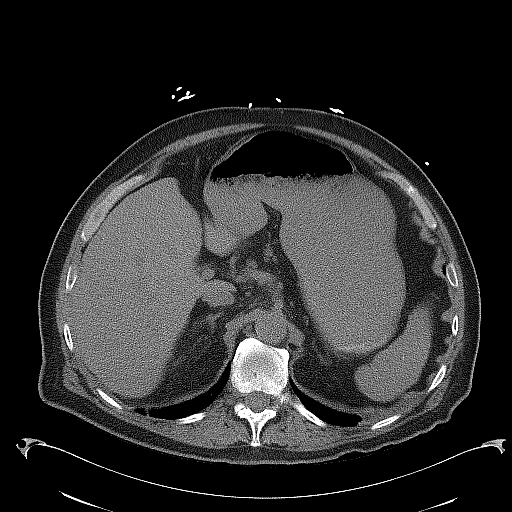
[im 6/20  soft-tissue]
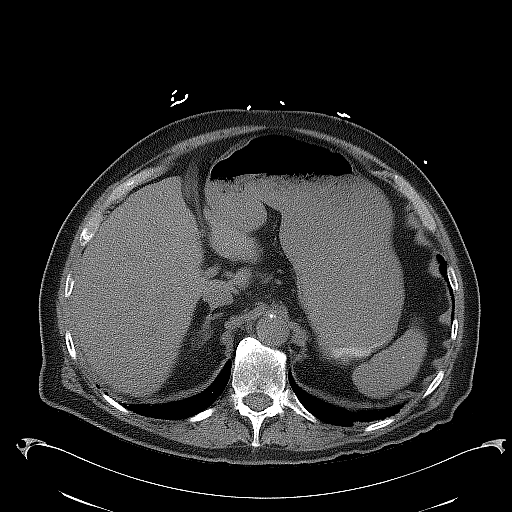
[im 8/20  soft-tissue]
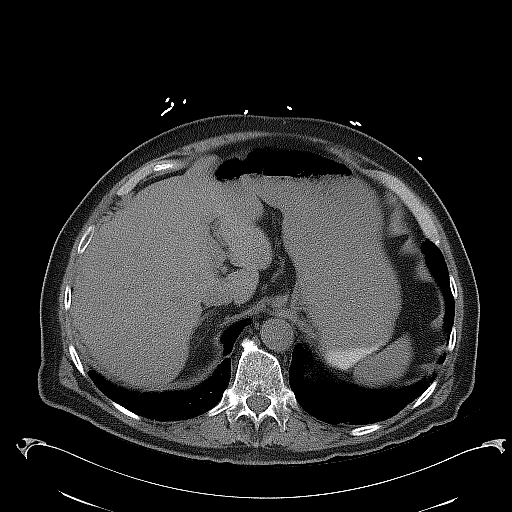
[im 9/20  soft-tissue]
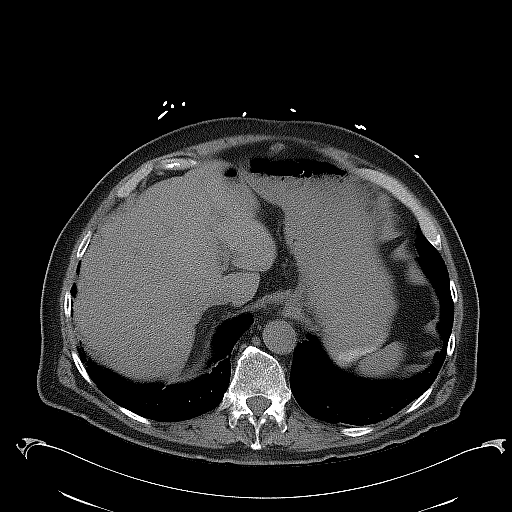
[im 11/20  soft-tissue]
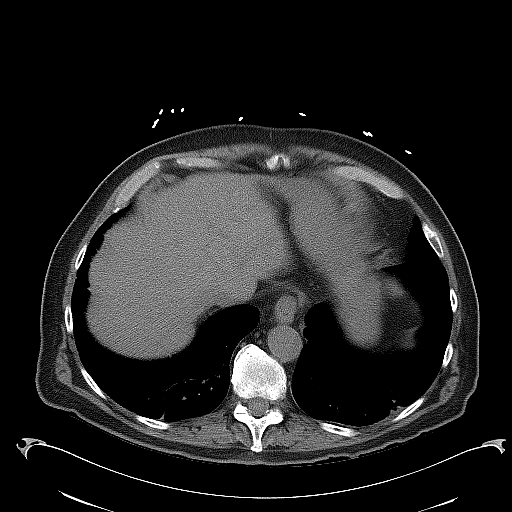
[im 12/20  soft-tissue]
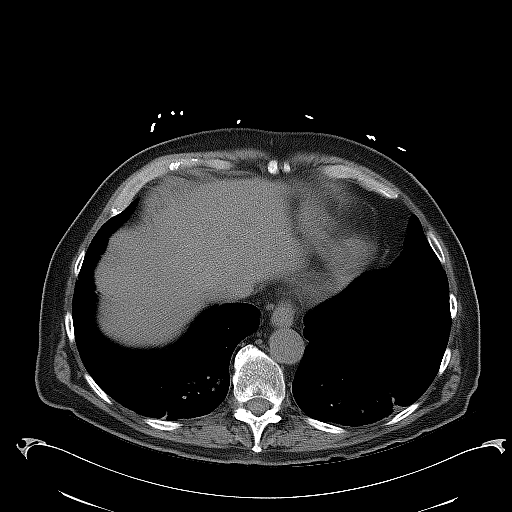
[im 13/20  soft-tissue]
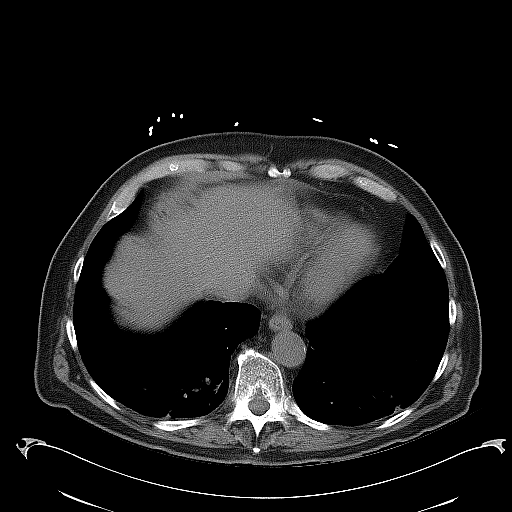
[im 13/20  bone]
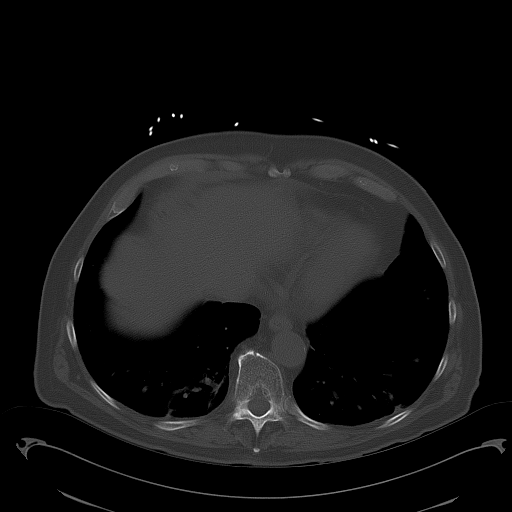
[im 15/20  soft-tissue]
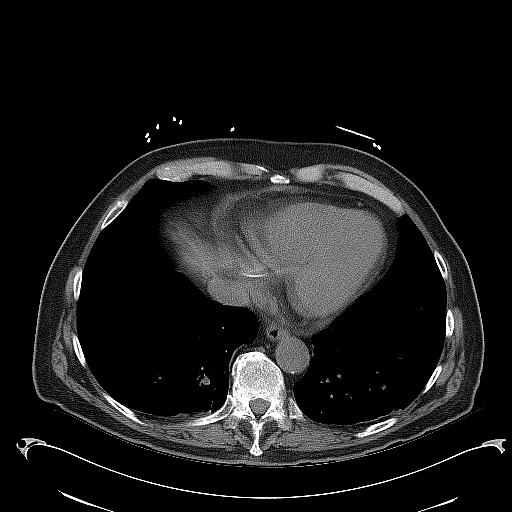
[im 16/20  soft-tissue]
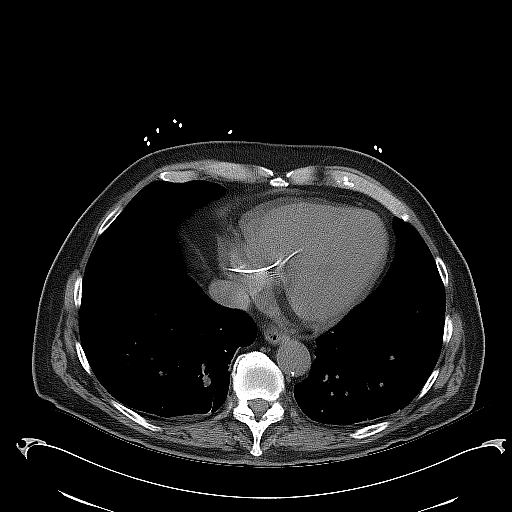
[im 17/20  soft-tissue]
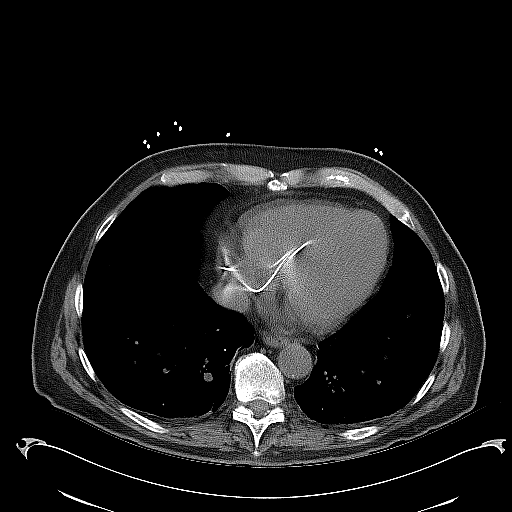
[im 19/20  soft-tissue]
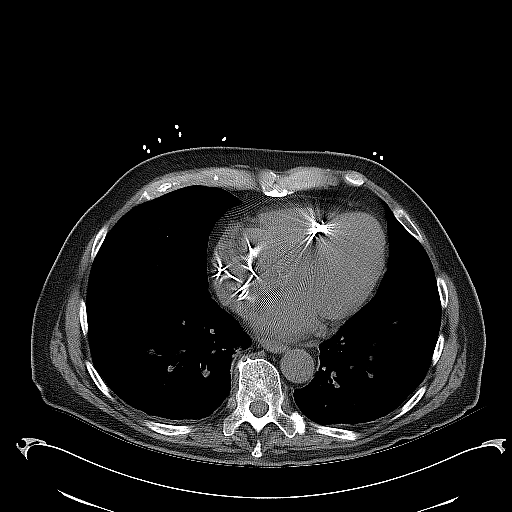

[Series 602: <mpr thick range> · coronal · 0.83mm/px · 3 of 151 slices shown]
[im 51/151  soft-tissue]
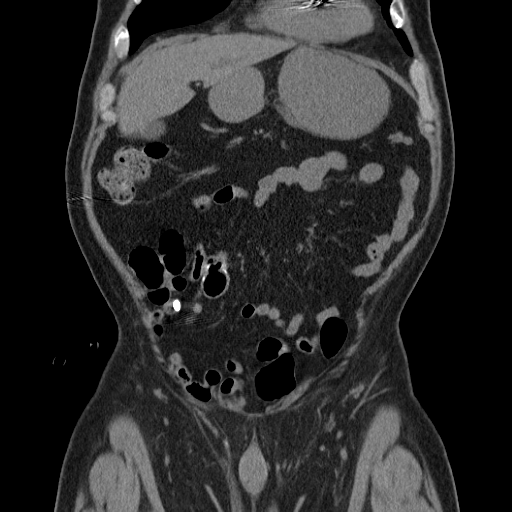
[im 67/151  soft-tissue]
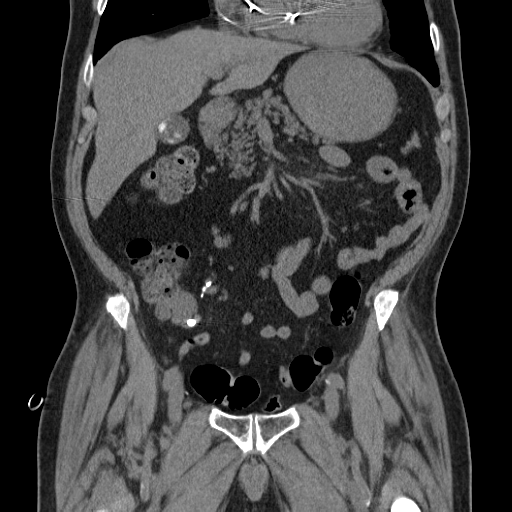
[im 84/151  soft-tissue]
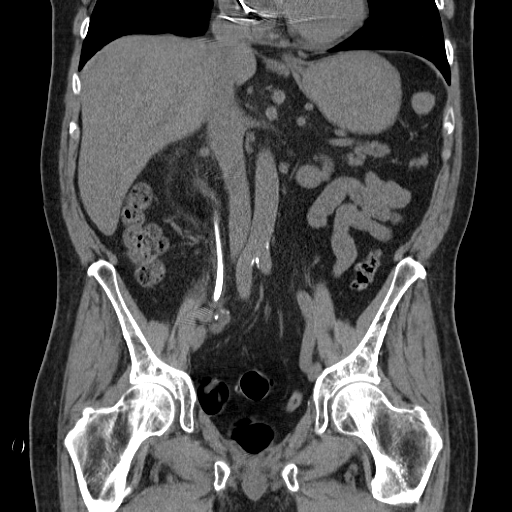

[16 of 34 positions shown; findings below may reference images not displayed]

FINDINGS: Lung bases are unremarkable. Sagittal images of the spine shows
osteopenia and degenerative changes thoracolumbar spine.

Partially visualized cardiac pacemaker leads. Unenhanced liver is
unremarkable. Multiple calcified gallstones are noted in dependent
gallbladder largest measures 9 mm. Unenhanced pancreas, spleen and
adrenal glands are unremarkable. Again noted status post cystectomy
and prostatectomy with ileal conduit in right lower quadrant. There
is a right ureteral stent exiting ileal conduit in right lower
quadrant. No right hydronephrosis. Exophytic cyst in posterior
aspect midpole of the right kidney measures 3.5 cm on the prior exam
measures 4.3 cm.

No left hydronephrosis is noted. There is a cyst in midpole of the
left kidney medial aspect measures 1.5 cm. Nonobstructive calculus
in lower pole of the left kidney measures 6.4 mm. No ureteral
calculi are noted bilaterally.

No small bowel obstruction. There is a small lower midline pelvic
wall ventral hernia containing fat and small bowel loop without
evidence of acute complication. A left inguinal hernia containing
fat measures 3.9 cm. No evidence of acute complication. Moderate
distension of the rectum with gas. Mild gaseous distension of
sigmoid colon.
IMPRESSION: 1. There is a right ureteral stent in place. The stent is exiting in
right lower quadrant through the ileal conduit. No hydronephrosis.
Again noted postsurgical changes post cystectomy and prostatectomy.
2. There is a cyst in midpole of the right kidney posteriorly
measures 3.5 cm decreased in size from prior exam. Left
nonobstructive nephrolithiasis. No left ureteral calculi are noted.
No left hydronephrosis.
3. Calcified gallstones are noted within gallbladder the largest
measures 9 mm.
4. No small bowel obstruction. Moderate gaseous distension of
sigmoid colon. There is gas in the rectum. There is left inguinal
hernia containing fat without evidence of acute complication.

## 2015-11-30 ENCOUNTER — Other Ambulatory Visit (HOSPITAL_COMMUNITY): Payer: Medicare Other

## 2015-12-04 NOTE — Progress Notes (Signed)
Electrophysiology Office Note Date: 12/06/2015  ID:  Kendale, Rembold 1921/04/21, MRN [id number]  PCP: Jerlyn Ly, MD Electrophysiologist: Lovena Le  CC: Pacemaker follow-up  [name] [name] is a 82 y.o. male seen today for Dr Lovena Le.   He presents today for routine electrophysiology followup.  Since last being seen in our clinic, the patient reports doing very well.  He denies chest pain, palpitations, dyspnea, PND, orthopnea, nausea, vomiting, dizziness, syncope, edema, weight gain, or early satiety.  He is relatively inactive at home, but has no functional limitations around the house.  He has stopped driving 2/2 macular degeneration.  Device History: MDT dual chamber PPM implanted 2009 for symptomatic bradycardia   Past Medical History:  Diagnosis Date  . Aortic valve stenosis    a. mild to moderate by echo 2013  . Bradycardia    MDT dual chamber pacemaker  . Chronic kidney disease   . Deep venous thrombosis (H[facility])    hx of  LEFT ARM AFTER PACEMAKER INSERTION ABOUT 6 YRS AGO  . Depression   . Gout   . Hypertension   . Kidney stone   . Macular degeneration    PT STATES HIS VISION STILL OKAY FOR DRIVING  . Osteoporosis   . Prostate cancer ([place])    AND BLADDER CANCER - S/P URETEROILEAL CONDUIT  . S/P ileal conduit [facility])        Past Surgical History:  Procedure Laterality Date  . 07/20/13  CONVERSION OF RIGHT SIDED NEPHROSTOMY CATHETER TO RIGHT SIDED NEPHRO URETERAL CATHETER - DONE IN INTERVENTIONAL RADIOLOGY    . APPENDECTOMY    . CATARACT EXTRACTION, BILATERAL    . CYSTECTOMY    . ilial conduit     for bladder cancer  . IR GENERIC HISTORICAL  10/26/2015   IR NEPHROSTOMY TUBE CHANGE 10/26/2015 Jacqulynn Cadet, MD [facility]-INTERV RAD  . KIDNEY STONE SURGERY    . LYMPHADENECTOMY    . NEPHROLITHOTOMY  09/02/2011   Procedure: NEPHROLITHOTOMY PERCUTANEOUS;  Surgeon: Malka So, MD;  Location: [facility] ORS;  Service: Urology;  Laterality: Left;  . NEPHROLITHOTOMY Right  07/27/2013   Procedure: RIGHT PERCUTANEOUS NEPHROLITHOTOMY ;  Surgeon: Irine Seal, MD;  Location: [facility] ORS;  Service: Urology;  Laterality: Right;  . PACEMAKER INSERTION  2009   MDT dual chamber pacemaker implanted by Dr Verlon Setting  . PROSTATE SURGERY      Current Outpatient Prescriptions  Medication Sig Dispense Refill  . acetaminophen (TYLENOL) 500 MG tablet Take 1,000 mg by mouth every 8 (eight) hours as needed for mild pain or headache.    Marland Kitchen amLODipine (NORVASC) 5 MG tablet Take 5 mg by mouth every morning.     Marland Kitchen aspirin EC 81 MG tablet Take 81 mg by mouth every morning.    . Cholecalciferol (VITAMIN D-3) 1000 UNITS CAPS Take 1 capsule by mouth every morning.    . ciprofloxacin (CIPRO) 250 MG tablet As directed    . Colchicine 0.6 MG CAPS Take 1 tablet by mouth daily.    Marland Kitchen docusate sodium (COLACE) 100 MG capsule Take 100 mg by mouth daily.     . Hyprom-Naphaz-Polysorb-Zn Sulf (CLEAR EYES COMPLETE OP) Apply 1-2 drops to eye daily as needed (dry eyes).    . Multiple Vitamins-Minerals (PRESERVISION AREDS 2 PO) Take 1 tablet by mouth 2 (two) times daily.    . OMEGA-3 KRILL OIL PO Take 1 tablet by mouth every morning.    Marland Kitchen ULORIC 40 MG tablet Take 1 tablet by  mouth daily.     No current facility-administered medications for this visit.     Allergies:   Review of patient's allergies indicates no known allergies.   Social History: Social History   Social History  . Marital status: Widowed    Spouse name: N/A  . Number of children: 3  . Years of education: N/A   Occupational History  .  Retired   Social History Main Topics  . Smoking status: Former Smoker    Types: Cigarettes    Quit date: 03/25/1952  . Smokeless tobacco: Never Used  . Alcohol use No  . Drug use: No  . Sexual activity: No   Other Topics Concern  . Not on file   Social History Narrative  . No narrative on file    Family History: Family History  Problem Relation Age of Onset  . Prostate cancer Father 21     died  . Pancreatic cancer Mother 43    died     Review of Systems: All other systems reviewed and are otherwise negative except as noted above.   Physical Exam: VS:  BP 138/70   Pulse 90   Ht 5\' 6"  (1.676 m)   Wt 163 lb 3.2 oz (74 kg)   SpO2 96%   BMI 26.34 kg/m  , BMI Body mass index is 26.34 kg/m.  GEN- The patient is elderly appearing, alert and oriented x 3 today.   HEENT: normocephalic, atraumatic; sclera clear, conjunctiva pink; hearing intact; oropharynx clear; neck supple  Lungs- Clear to ausculation bilaterally, normal work of breathing.  No wheezes, rales, rhonchi Heart- Regular rate and rhythm, 2/6 SEM GI- soft, non-tender, non-distended, bowel sounds present  Extremities- no clubbing, cyanosis, or edema; DP/PT/radial pulses 2+ bilaterally MS- no significant deformity or atrophy Skin- warm and dry, no rash or lesion; PPM pocket well healed Psych- euthymic mood, full affect Neuro- strength and sensation are intact  PPM Interrogation- reviewed in detail today,  See PACEART report  EKG:  EKG is not ordered today.  Recent Labs: No results found for requested labs within last 8760 hours.   Wt Readings from Last 3 Encounters:  12/06/15 163 lb 3.2 oz (74 kg)  10/05/14 168 lb (76.2 kg)  02/05/14 168 lb 12.8 oz (76.6 kg)     Other studies Reviewed: Additional studies/ records that were reviewed today include: office notes, hospital records  Assessment and Plan:  1.  Symptomatic bradycardia Normal PPM function See Pace Art report No changes today  2.  HTN Stable No change required today   Current medicines are reviewed at length with the patient today.   The patient does not have concerns regarding his medicines.  The following changes were made today:  none  Labs/ tests ordered today include: none   Disposition:   Follow up with device clinic in 6 months, Dr Lovena Le in 1 year.  He is disinclined to do remote monitoring which I think is ok with advanced  age.    Signed, Chanetta Marshall, NP 12/06/2015 12:15 PM  [place] [phone] (office) [phone] (fax)

## 2015-12-06 ENCOUNTER — Ambulatory Visit (INDEPENDENT_AMBULATORY_CARE_PROVIDER_SITE_OTHER): Payer: Medicare Other | Admitting: Nurse Practitioner

## 2015-12-06 ENCOUNTER — Encounter (INDEPENDENT_AMBULATORY_CARE_PROVIDER_SITE_OTHER): Payer: Self-pay

## 2015-12-06 ENCOUNTER — Encounter: Payer: Self-pay | Admitting: Nurse Practitioner

## 2015-12-06 VITALS — BP 138/70 | HR 90 | Ht 66.0 in | Wt 163.2 lb

## 2015-12-06 DIAGNOSIS — I10 Essential (primary) hypertension: Secondary | ICD-10-CM

## 2015-12-06 DIAGNOSIS — R001 Bradycardia, unspecified: Secondary | ICD-10-CM

## 2015-12-06 NOTE — Patient Instructions (Addendum)
Medication Instructions:   Your physician recommends that you continue on your current medications as directed. Please refer to the Current Medication list given to you today.   If you need a refill on your cardiac medications before your next appointment, please call your pharmacy.  Labwork: NONE ORDER TODAY    Testing/Procedures: NONE ORDER TODAY    Follow-Up:  DEVICE CLINIC IN 6 MONTHS   Your physician wants you to follow-up in: [place] will receive a reminder letter in the mail two months in advance. If you don't receive a letter, please call our office to schedule the follow-up appointment.     Any Other Special Instructions Will Be Listed Below (If Applicable).

## 2015-12-07 ENCOUNTER — Other Ambulatory Visit: Payer: Self-pay | Admitting: Urology

## 2015-12-07 ENCOUNTER — Encounter (HOSPITAL_COMMUNITY): Payer: Self-pay | Admitting: Interventional Radiology

## 2015-12-07 ENCOUNTER — Ambulatory Visit (HOSPITAL_COMMUNITY)
Admission: RE | Admit: 2015-12-07 | Discharge: 2015-12-07 | Disposition: A | Discharge status: Home / Self Care | Payer: Medicare Other | Source: Ambulatory Visit | Attending: Urology | Admitting: Urology

## 2015-12-07 DIAGNOSIS — N135 Crossing vessel and stricture of ureter without hydronephrosis: Secondary | ICD-10-CM | POA: Insufficient documentation

## 2015-12-07 DIAGNOSIS — Z436 Encounter for attention to other artificial openings of urinary tract: Secondary | ICD-10-CM | POA: Insufficient documentation

## 2015-12-07 HISTORY — PX: IR GENERIC HISTORICAL: IMG1180011

## 2015-12-07 MED ORDER — IOPAMIDOL (ISOVUE-300) INJECTION 61%
50.0000 mL | Freq: Once | INTRAVENOUS | Status: AC | PRN
  Administered 2015-12-07: 25 mL

## 2015-12-07 NOTE — Procedures (Signed)
S/p rt nephroureteral cath exchg thru ostomy No comp Stable upsized to 34fr because of occlusion  Full report in PACS

## 2015-12-26 IMAGING — XA IR CATHETER TUBE CHANGE
1 series · 5 of 5 positions shown · non-contrast
Comparison: Multiple prior fluoroscopic guided right-sided nephro
ureteral catheter exchanges

INDICATION: Routine exchange

EXAM:
FLUOROSCOPIC GUIDED RIGHT SIDED RETROGRADE NEPHRO URETERAL CATHETER
EXCHANGE
TECHNIQUE: Informed written consent was obtained from the patient after a
discussion of the risks, benefits and alternatives to treatment.
Questions regarding the procedure were encouraged and answered. A
timeout was performed prior to the initiation of the procedure.

[Series 300: tube placements · 5 of 5 slices shown]
[im 1/5]
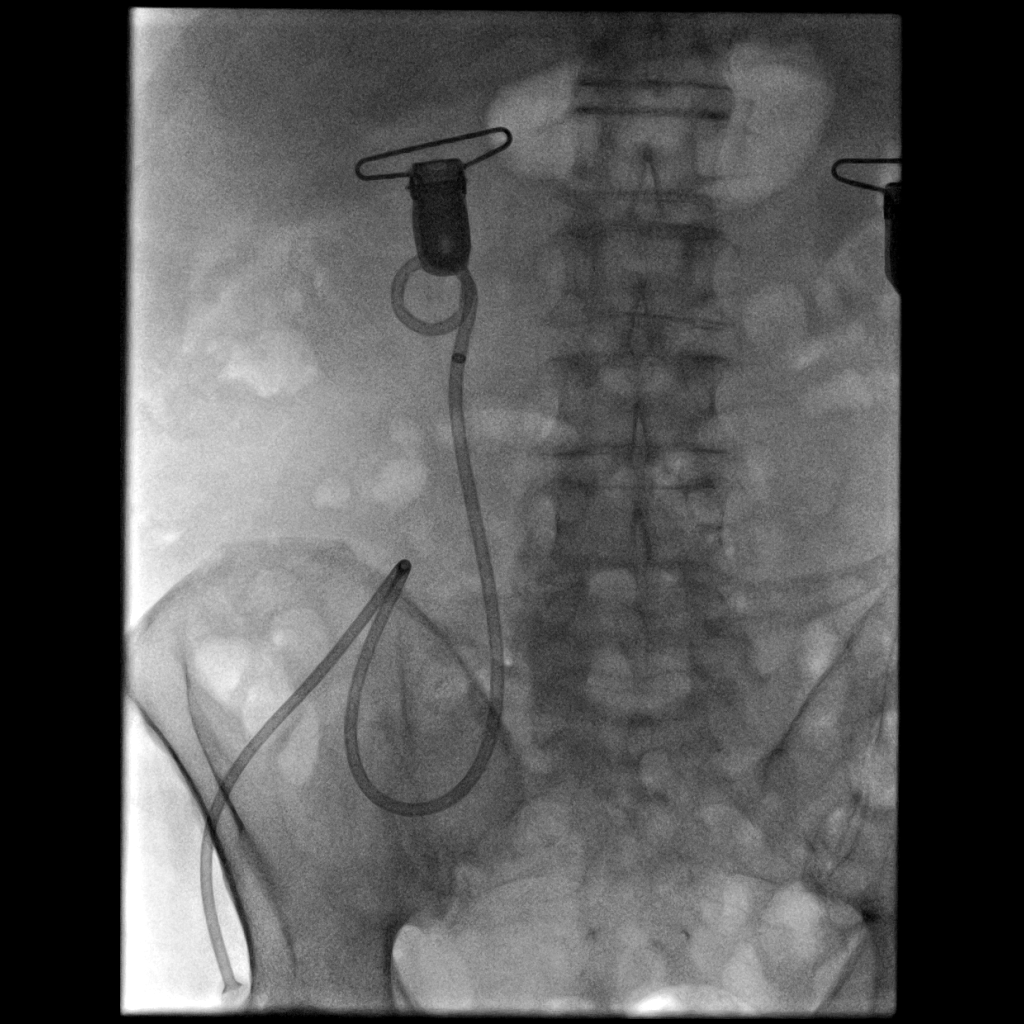
[im 2/5]
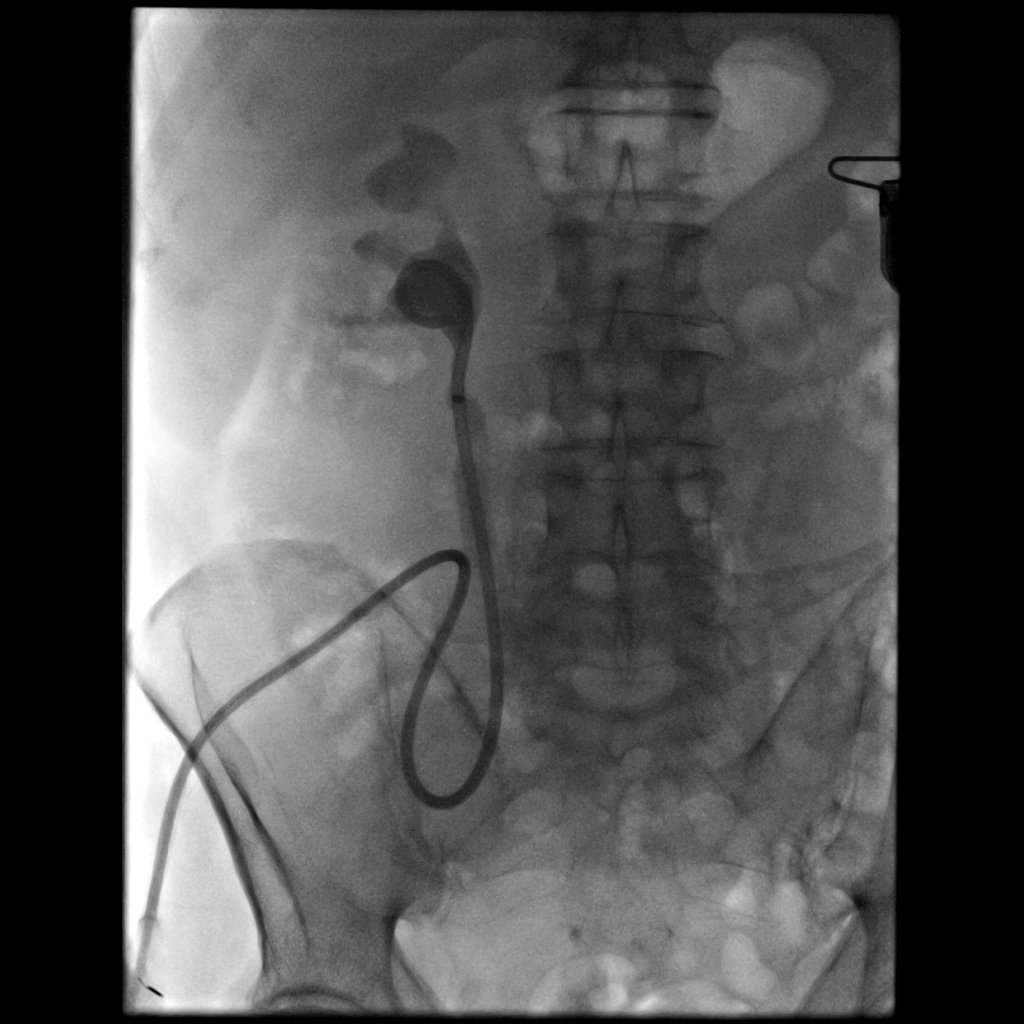
[im 3/5]
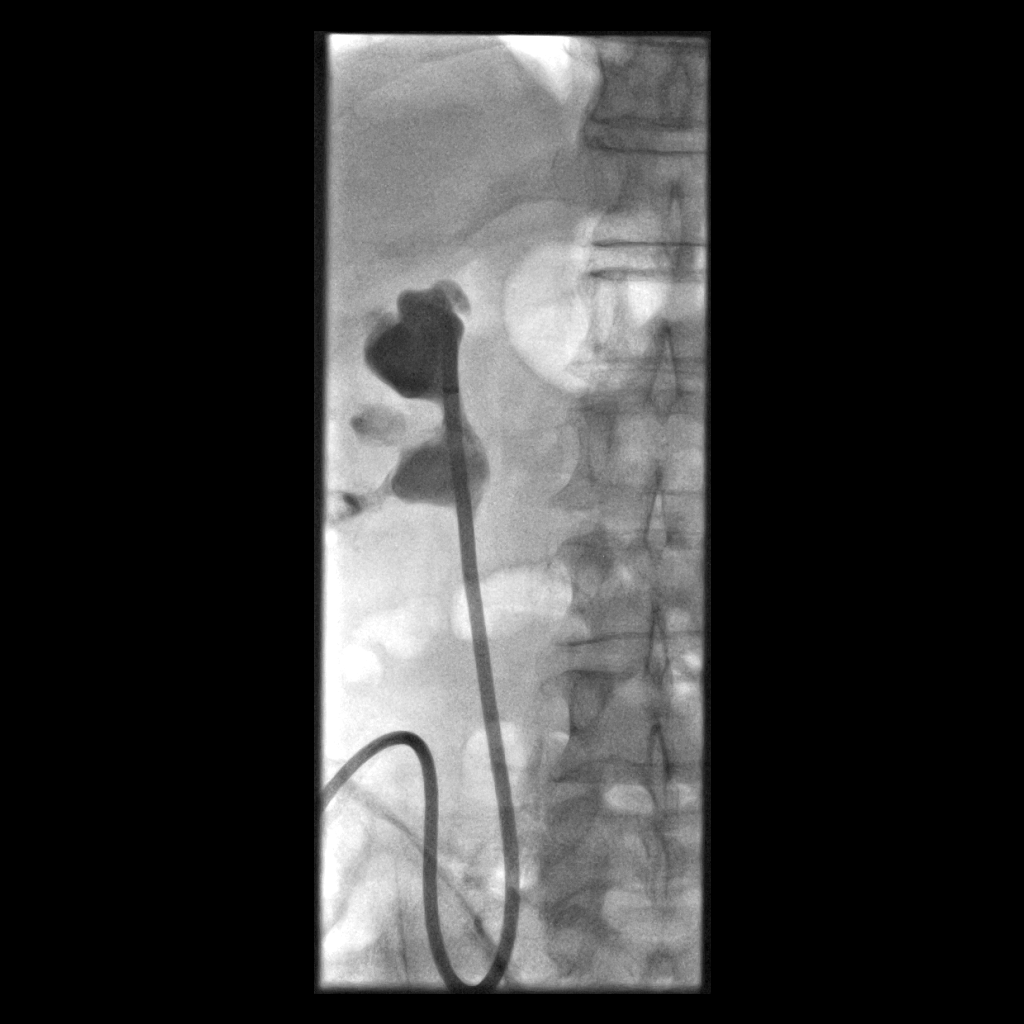
[im 4/5]
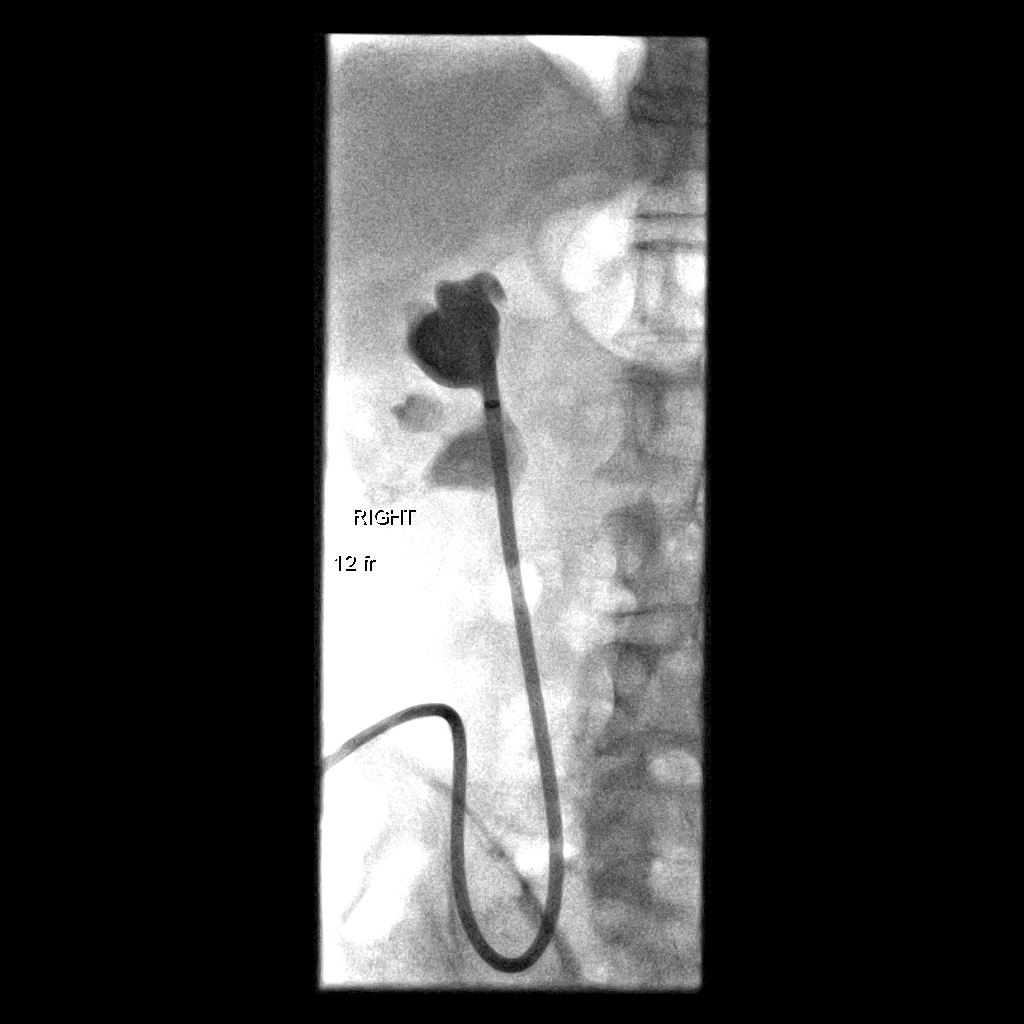
[im 5/5]
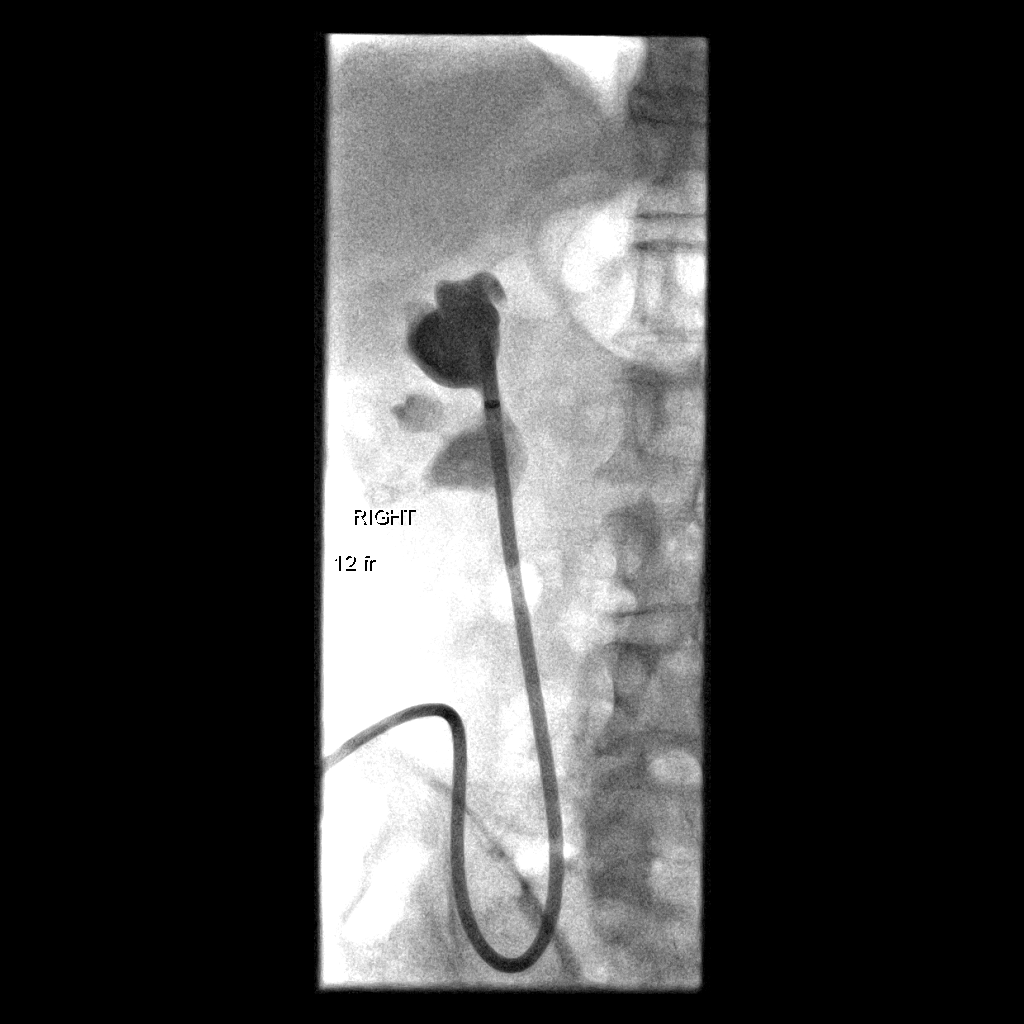

[5 of 5 positions shown; findings below may reference images not displayed]

CONTRAST:  15 mL Rsovue-RKK administered into the collecting system

FLUOROSCOPY TIME:  4 minutes 24 seconds (72 mGy)

COMPLICATIONS:
None immediate
The skin surrounding the patient's urostomy within the right lower
abdominal quadrant was prepped and draped in usual sterile fashion.
A sterile drape was applied covering the operative field. Maximum
barrier sterile technique with sterile gowns and gloves were used
for the procedure. A timeout was performed prior to the initiation
of the procedure.

A pre procedural spot fluoroscopic image was obtained after contrast
was injected via the existing retrograde nephro ureteral catheter
demonstrating appropriate positioning within the renal pelvis. The
existing catheter was cut and cannulated with an Amplatz wire which
was coiled within the renal pelvis. Under intermittent fluoroscopic
guidance, the existing nephrostomy catheter was exchanged for a new
12 French, 45 cm all-purpose drainage catheter. Contrast injection
confirmed appropriate positioning within the renal pelvis and a post
exchange fluoroscopic image was obtained. The catheter was locked
and secured to the skin with a suture. A dressing was placed. The
patient tolerated the procedure well without immediate
postprocedural complication.
FINDINGS: The existing right-sided retrograde nephro ureteral catheter is
appropriately positioned and functioning.

After successful fluoroscopic guided exchange, the new nephro
ureteral catheter is coiled and locked within the superior moiety of
the right renal collecting system.

Note, again the patient's anatomy is noted to be tortuous and is
markedly tortuous and again it is recommended that future exchanges
be performed over a stiff Amplatz wire.
IMPRESSION: Successful fluoroscopic guided exchange of right sided 12 French, 45
cm retrograde nephro ureteral catheter.

## 2016-01-11 ENCOUNTER — Other Ambulatory Visit: Payer: Self-pay | Admitting: Urology

## 2016-01-11 ENCOUNTER — Encounter (HOSPITAL_COMMUNITY): Payer: Self-pay | Admitting: Interventional Radiology

## 2016-01-11 ENCOUNTER — Ambulatory Visit (HOSPITAL_COMMUNITY)
Admission: RE | Admit: 2016-01-11 | Discharge: 2016-01-11 | Disposition: A | Discharge status: Home / Self Care | Payer: Medicare Other | Source: Ambulatory Visit | Attending: Urology | Admitting: Urology

## 2016-01-11 DIAGNOSIS — N135 Crossing vessel and stricture of ureter without hydronephrosis: Secondary | ICD-10-CM

## 2016-01-11 DIAGNOSIS — C61 Malignant neoplasm of prostate: Secondary | ICD-10-CM | POA: Insufficient documentation

## 2016-01-11 DIAGNOSIS — C679 Malignant neoplasm of bladder, unspecified: Secondary | ICD-10-CM | POA: Diagnosis present

## 2016-01-11 HISTORY — PX: IR GENERIC HISTORICAL: IMG1180011

## 2016-01-11 MED ORDER — IOPAMIDOL (ISOVUE-300) INJECTION 61%
50.0000 mL | Freq: Once | INTRAVENOUS | Status: AC | PRN
  Administered 2016-01-11: 7 mL via INTRAVENOUS

## 2016-01-11 NOTE — Procedures (Signed)
S/p 14 fr right nephroureteral cath exchg  No comp Stable Full report in PACS

## 2016-01-17 ENCOUNTER — Encounter (INDEPENDENT_AMBULATORY_CARE_PROVIDER_SITE_OTHER): Payer: Medicare Other | Admitting: Ophthalmology

## 2016-01-17 DIAGNOSIS — H353114 Nonexudative age-related macular degeneration, right eye, advanced atrophic with subfoveal involvement: Secondary | ICD-10-CM | POA: Diagnosis not present

## 2016-01-17 DIAGNOSIS — H353221 Exudative age-related macular degeneration, left eye, with active choroidal neovascularization: Secondary | ICD-10-CM | POA: Diagnosis not present

## 2016-01-17 DIAGNOSIS — H43813 Vitreous degeneration, bilateral: Secondary | ICD-10-CM

## 2016-01-17 DIAGNOSIS — I10 Essential (primary) hypertension: Secondary | ICD-10-CM | POA: Diagnosis not present

## 2016-01-17 DIAGNOSIS — H35033 Hypertensive retinopathy, bilateral: Secondary | ICD-10-CM | POA: Diagnosis not present

## 2016-01-31 IMAGING — XA IR BILIARY CATHETER EXCHANGE
1 series · 2 of 2 positions shown · non-contrast
Comparison: none

CLINICAL DATA: History of bladder and prostate carcinoma with
indwelling chronic right-sided nephroureteral catheter via ileostomy
to treat ureteral stricture. The patient requires routine exchange
of the catheter.

[Series 300: ir catheter tube change · 2 of 2 slices shown]
[im 1/2]
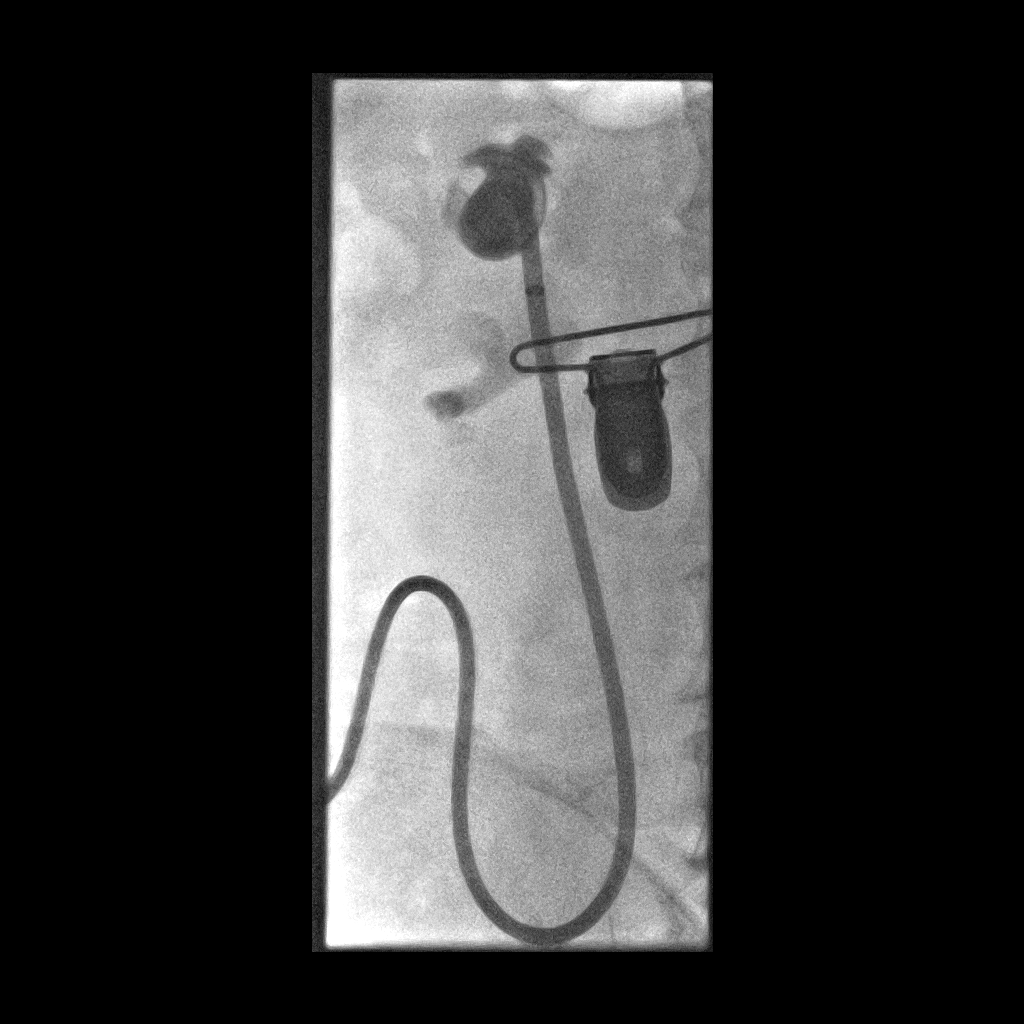
[im 2/2]
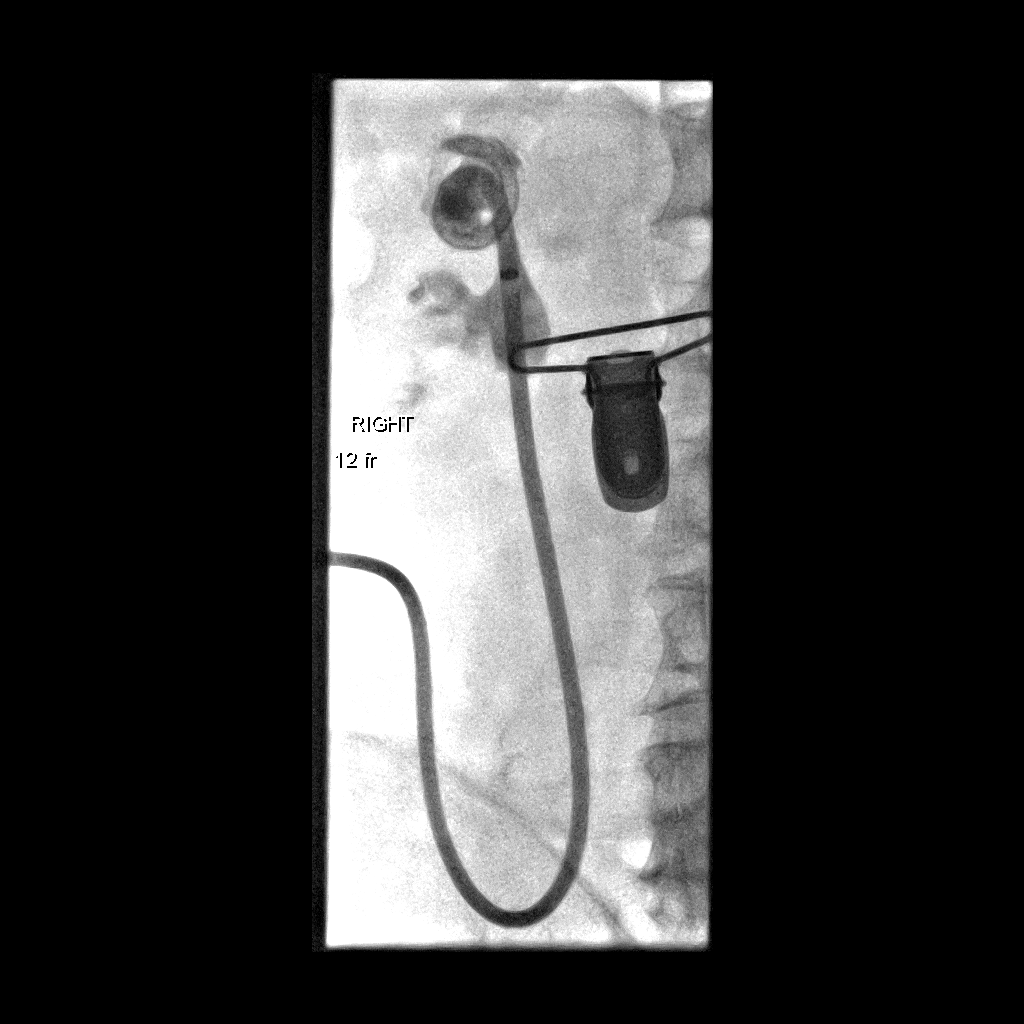

[2 of 2 positions shown; findings below may reference images not displayed]

EXAM:
RIGHT NEPHROURETERAL CATHETER EXCHANGE

CONTRAST:  10 ml [id number]

FLUOROSCOPY TIME:  18 seconds.

PROCEDURE:
The procedure, risks, benefits, and alternatives were explained to
the patient. Questions regarding the procedure were encouraged and
answered. The patient understands and consents to the procedure.

The pre-existing catheter was prepped with Betadine in a sterile
fashion, and a sterile drape was applied covering the operative
field. sterile gown and sterile gloves were used for the procedure.

The preexisting catheter was injected with contrast material under
fluoroscopy. It was then removed over a guidewire. A new 12 French
nephroureteral catheter was advanced over the wire. The catheter was
formed.

Final catheter position was confirmed with a fluoroscopic spot image
obtained after injection of contrast.

COMPLICATIONS:
None.
FINDINGS: A new catheter was formed in the upper pole collecting system.
IMPRESSION: Exchange of chronic indwelling right-sided nephroureteral catheter
via ileostomy. A new 12 French catheter was placed and formed in the
upper pole collecting system.

## 2016-02-08 ENCOUNTER — Other Ambulatory Visit: Payer: Self-pay | Admitting: Urology

## 2016-02-08 ENCOUNTER — Encounter (HOSPITAL_COMMUNITY): Payer: Self-pay | Admitting: Interventional Radiology

## 2016-02-08 ENCOUNTER — Ambulatory Visit (HOSPITAL_COMMUNITY)
Admission: RE | Admit: 2016-02-08 | Discharge: 2016-02-08 | Disposition: A | Discharge status: Home / Self Care | Payer: Medicare Other | Source: Ambulatory Visit | Attending: Interventional Radiology | Admitting: Interventional Radiology

## 2016-02-08 DIAGNOSIS — N135 Crossing vessel and stricture of ureter without hydronephrosis: Secondary | ICD-10-CM | POA: Diagnosis not present

## 2016-02-08 DIAGNOSIS — Z466 Encounter for fitting and adjustment of urinary device: Secondary | ICD-10-CM | POA: Diagnosis present

## 2016-02-08 HISTORY — PX: IR GENERIC HISTORICAL: IMG1180011

## 2016-02-08 MED ORDER — IOPAMIDOL (ISOVUE-300) INJECTION 61%
50.0000 mL | Freq: Once | INTRAVENOUS | Status: AC | PRN
  Administered 2016-02-08: 10 mL

## 2016-02-08 MED ORDER — IOPAMIDOL (ISOVUE-300) INJECTION 61%
INTRAVENOUS | Status: AC
Start: 2016-02-08 — End: 2016-02-08
  Administered 2016-02-08: 10 mL
  Filled 2016-02-08: qty 50

## 2016-02-28 IMAGING — XA IR BILIARY CATHETER EXCHANGE
1 series · 2 of 2 positions shown · IV contrast (omnipaque)
Comparison: none

CLINICAL DATA: [AGE] male with chronic right-sided retrograde
percutaneous nephro ureteral stent through an existing right lower
quadrant ileostomy. He presents for routine tube exchange.

EXAM:
PERC TUBE CHG W/CM
Date: 05/12/2014
PROCEDURE:
1. Exchange of existing percutaneous nephro ureteral stent under
fluoroscopy
ANESTHESIA/SEDATION:
None required
MEDICATIONS:
FLUOROSCOPY TIME:  1 minutes 24 seconds
21 mGy
CONTRAST:  10mL OMNIPAQUE IOHEXOL 300 MG/ML  SOLN
TECHNIQUE: Informed consent was obtained from the patient following explanation
of the procedure, risks, benefits and alternatives. The patient
understands, agrees and consents for the procedure. All questions
were addressed. A time out was performed.

[Series 300: sp perc tube change w/cm · 2 of 2 slices shown]
[im 1/2]
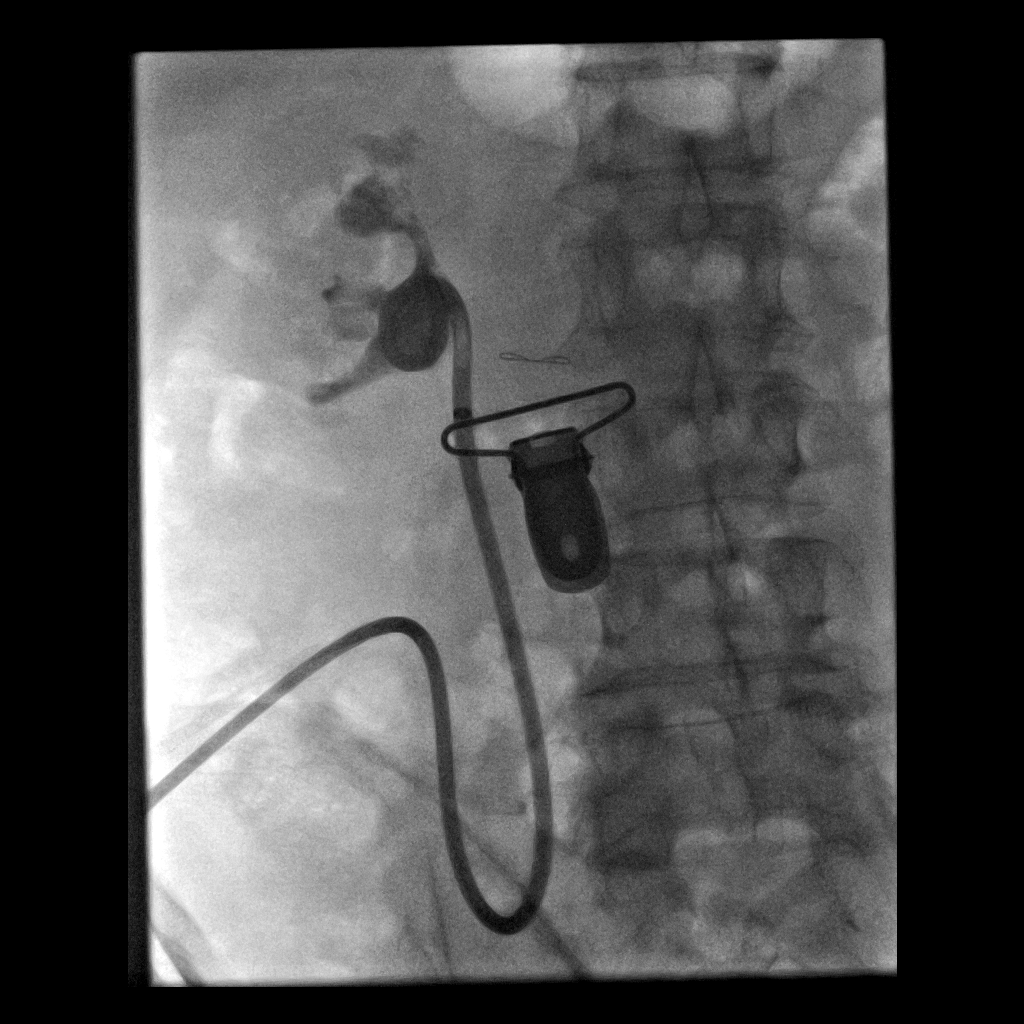
[im 2/2]
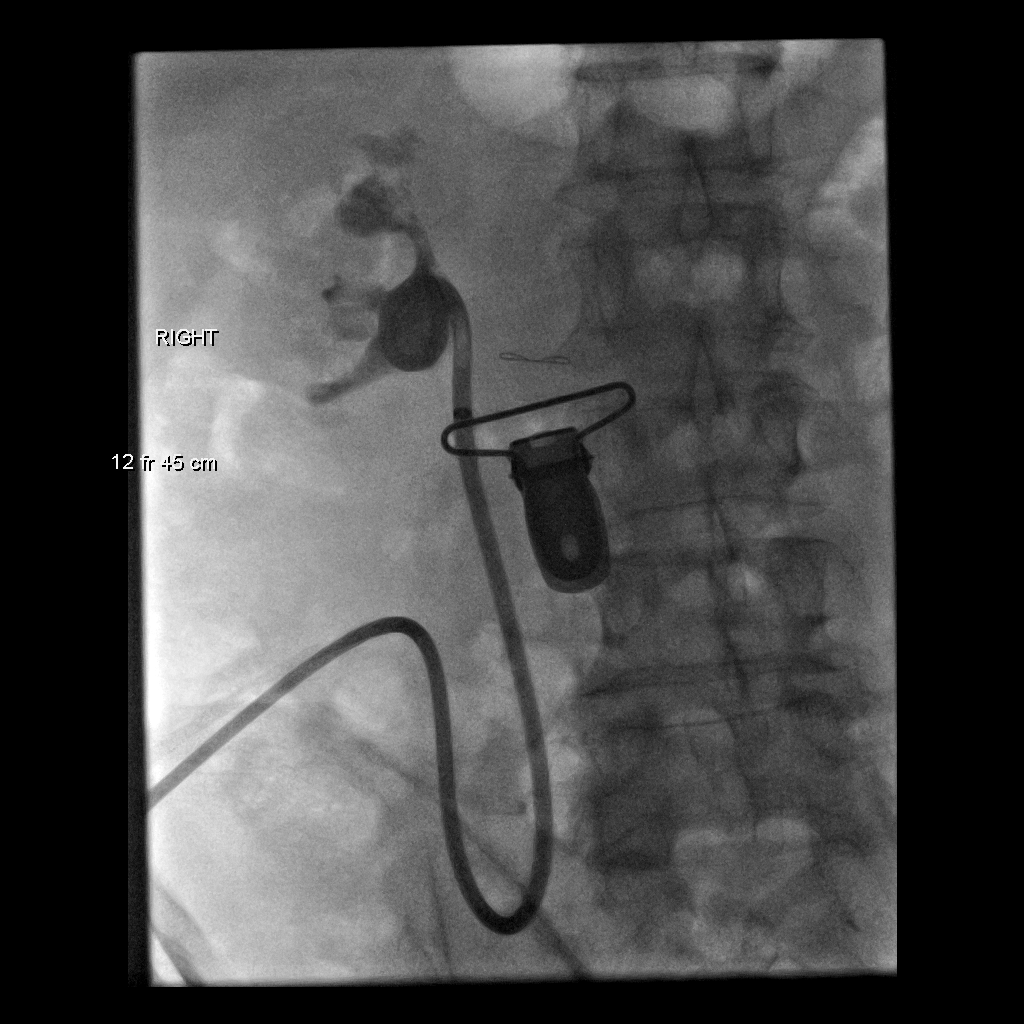

[2 of 2 positions shown; findings below may reference images not displayed]

Maximal barrier sterile technique utilized including caps, mask,
sterile gowns, sterile gloves, large sterile drape, hand hygiene,
and Betadine skin prep.

A gentle hand injection of contrast material confirms the existing
tube is in the upper pole infundibulum. The tube was cut and removed
over an Amplatz wire. Tuck Seng Maen 45 cm along a 10.2 French
all-purpose drainage catheter was then advanced over the wire and
formed with the locking loop in the renal pelvis. A gentle hand
injection of contrast material confirmed the location of the tube
fluoroscopically. An image was saved.

The patient tolerated the procedure well..

COMPLICATIONS:
None
IMPRESSION: Successful routine exchange of right-sided retrograde nephro
ureteral to via right lower quadrant ileostomy.

## 2016-03-07 ENCOUNTER — Other Ambulatory Visit (HOSPITAL_COMMUNITY): Payer: Medicare Other

## 2016-03-14 ENCOUNTER — Other Ambulatory Visit: Payer: Self-pay | Admitting: Urology

## 2016-03-14 ENCOUNTER — Ambulatory Visit (HOSPITAL_COMMUNITY)
Admission: RE | Admit: 2016-03-14 | Discharge: 2016-03-14 | Disposition: A | Discharge status: Home / Self Care | Payer: Medicare Other | Source: Ambulatory Visit | Attending: Urology | Admitting: Urology

## 2016-03-14 ENCOUNTER — Encounter (HOSPITAL_COMMUNITY): Payer: Self-pay | Admitting: Interventional Radiology

## 2016-03-14 ENCOUNTER — Telehealth: Payer: Self-pay | Admitting: Nurse Practitioner

## 2016-03-14 DIAGNOSIS — Z436 Encounter for attention to other artificial openings of urinary tract: Secondary | ICD-10-CM | POA: Insufficient documentation

## 2016-03-14 DIAGNOSIS — N135 Crossing vessel and stricture of ureter without hydronephrosis: Secondary | ICD-10-CM

## 2016-03-14 HISTORY — PX: IR GENERIC HISTORICAL: IMG1180011

## 2016-03-14 MED ORDER — IOPAMIDOL (ISOVUE-300) INJECTION 61%
INTRAVENOUS | Status: AC
  Administered 2016-03-14: 10 mL
  Filled 2016-03-14: qty 50

## 2016-03-14 MED ORDER — IOPAMIDOL (ISOVUE-300) INJECTION 61%
50.0000 mL | Freq: Once | INTRAVENOUS | Status: AC | PRN
  Administered 2016-03-14: 10 mL

## 2016-03-14 NOTE — Telephone Encounter (Signed)
New message  [employer] is calling inquiring if a provider query has been received for this pt  Please call back

## 2016-03-27 IMAGING — XA IR BILIARY CATHETER EXCHANGE
1 series · 1 of 1 positions shown · IV contrast (omnipaque)
Comparison: 05/12/2014

CLINICAL DATA: Bladder carcinoma post cystoprostatectomy and ileal
conduit creation. Long-term indwelling retrograde nephro ureteral
catheter.

EXAM:
RIGHT NEPHROURETERAL CATHETER EXCHANGE VIA ILEOSTOMY
FLUOROSCOPY TIME:  1 minutes 24 seconds, 20 mgy
CONTRAST:  10mL OMNIPAQUE IOHEXOL 300 MG/ML  SOLN

[Series 300: tube placements · 1 of 1 slices shown]
[im 1/1]
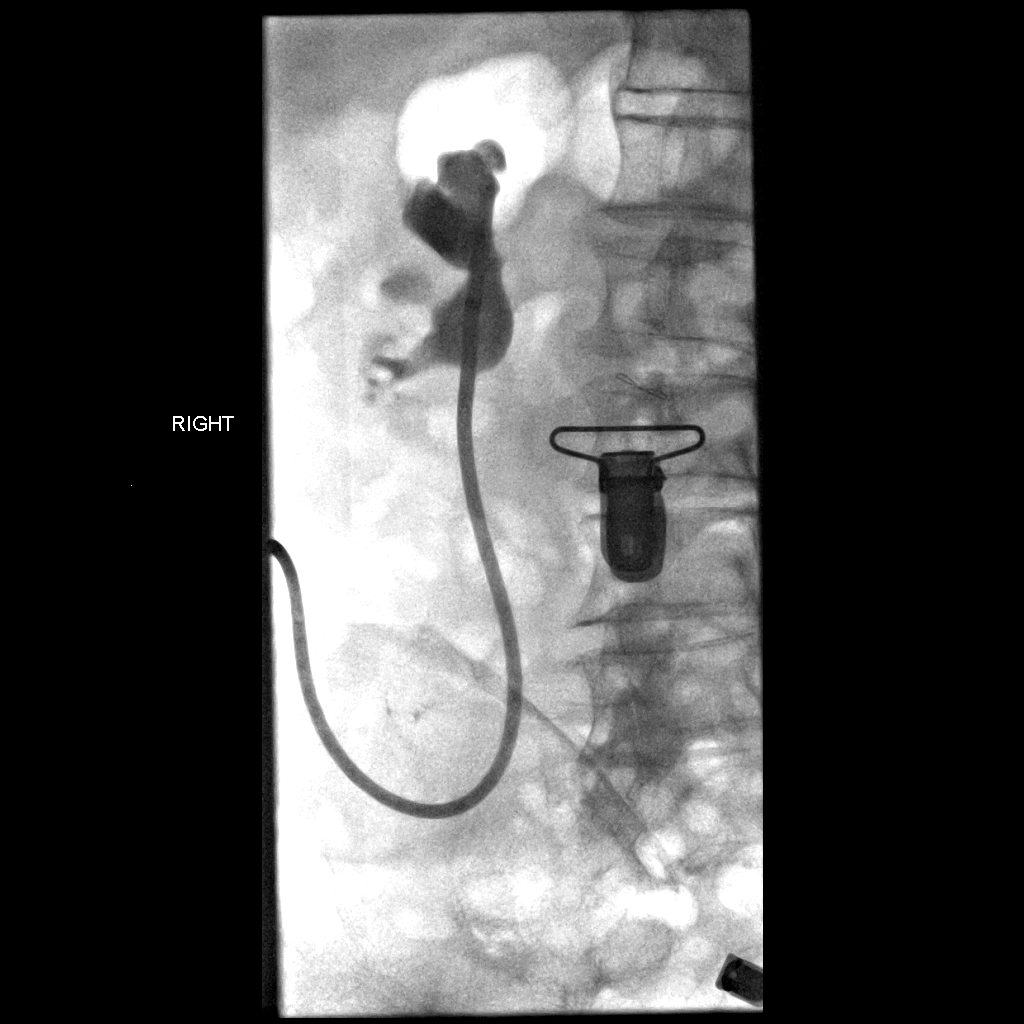

[1 of 1 positions shown; findings below may reference images not displayed]

PROCEDURE:
The procedure, risks (including but not limited to bleeding,
infection, organ damage ), benefits, and alternatives were explained
to the patient. Questions regarding the procedure were encouraged
and answered. The patient understands and consents to the procedure.
Preexisting catheter was prepped. It was removed over a stiff
glidewire. A new 12-French 45 cm nephroureteral catheter was
advanced over a wire. The catheter was formed at the level of the
renal pelvis. Contrast injection was performed and a fluoroscopic
spot image obtained to confirm position. Exiting catheter was then
placed in an ileostomy bag.

COMPLICATIONS:
None immediate
IMPRESSION: Exchange of indwelling right 12-French nephroureteral catheter via
ileostomy.

## 2016-03-28 ENCOUNTER — Telehealth: Payer: Self-pay | Admitting: Nurse Practitioner

## 2016-03-28 NOTE — Telephone Encounter (Signed)
It has to do with documentation.  I have asked they refax This is coming for [facility].

## 2016-03-28 NOTE — Telephone Encounter (Signed)
I haven't received any faxes and am not sure what he is referring to.  Please call patient and see if we need to do anything on our end.  Chanetta Marshall, NP 03/28/2016 1:42 PM

## 2016-03-28 NOTE — Telephone Encounter (Signed)
Calling again to see if you received fax concerning a Provider Query packet?

## 2016-04-10 ENCOUNTER — Encounter (INDEPENDENT_AMBULATORY_CARE_PROVIDER_SITE_OTHER): Payer: Medicare Other | Admitting: Ophthalmology

## 2016-04-15 ENCOUNTER — Encounter (INDEPENDENT_AMBULATORY_CARE_PROVIDER_SITE_OTHER): Payer: Medicare HMO | Admitting: Ophthalmology

## 2016-04-15 DIAGNOSIS — H353114 Nonexudative age-related macular degeneration, right eye, advanced atrophic with subfoveal involvement: Secondary | ICD-10-CM | POA: Diagnosis not present

## 2016-04-15 DIAGNOSIS — H35033 Hypertensive retinopathy, bilateral: Secondary | ICD-10-CM

## 2016-04-15 DIAGNOSIS — I10 Essential (primary) hypertension: Secondary | ICD-10-CM | POA: Diagnosis not present

## 2016-04-15 DIAGNOSIS — H43813 Vitreous degeneration, bilateral: Secondary | ICD-10-CM

## 2016-04-15 DIAGNOSIS — H353221 Exudative age-related macular degeneration, left eye, with active choroidal neovascularization: Secondary | ICD-10-CM

## 2016-04-17 ENCOUNTER — Other Ambulatory Visit (HOSPITAL_COMMUNITY): Payer: Medicare Other

## 2016-04-22 ENCOUNTER — Other Ambulatory Visit: Payer: Self-pay | Admitting: Urology

## 2016-04-22 ENCOUNTER — Encounter (HOSPITAL_COMMUNITY): Payer: Self-pay | Admitting: Interventional Radiology

## 2016-04-22 ENCOUNTER — Ambulatory Visit (HOSPITAL_COMMUNITY)
Admission: RE | Admit: 2016-04-22 | Discharge: 2016-04-22 | Disposition: A | Discharge status: Home / Self Care | Payer: Medicare HMO | Source: Ambulatory Visit | Attending: Urology | Admitting: Urology

## 2016-04-22 DIAGNOSIS — Z436 Encounter for attention to other artificial openings of urinary tract: Secondary | ICD-10-CM | POA: Insufficient documentation

## 2016-04-22 DIAGNOSIS — N135 Crossing vessel and stricture of ureter without hydronephrosis: Secondary | ICD-10-CM

## 2016-04-22 HISTORY — PX: IR GENERIC HISTORICAL: IMG1180011

## 2016-04-22 MED ORDER — IOPAMIDOL (ISOVUE-300) INJECTION 61%
10.0000 mL | Freq: Once | INTRAVENOUS | Status: AC | PRN
  Administered 2016-04-22: 10 mL

## 2016-04-22 MED ORDER — IOPAMIDOL (ISOVUE-300) INJECTION 61%
INTRAVENOUS | Status: AC
Start: 2016-04-22 — End: 2016-04-22
  Administered 2016-04-22: 10 mL
  Filled 2016-04-22: qty 50

## 2016-04-24 IMAGING — XA IR BILIARY CATHETER EXCHANGE
1 series · 3 of 3 positions shown · non-contrast
Comparison: Multiple previous fluoroscopic guided nephro ureteral
catheter exchanges, most recently, 06/09/2014.

INDICATION: History of bladder cancer post cystoprostatectomy and ileal conduit
creation. Long-term indwelling retrograde right-sided nephro
ureteral catheter. Patient presents today for routine exchange.

EXAM:
FLUOROSCOPIC GUIDED RIGHT SIDED NEPHRO URETERAL CATHETER EXCHANGE
VIA ILEOSTOMY
TECHNIQUE: Informed written consent was obtained from the patient after a
discussion of the risks, benefits and alternatives to treatment.
Questions regarding the procedure were encouraged and answered. A
timeout was performed prior to the initiation of the procedure.

[Series 300: fl - angio · 3 of 3 slices shown]
[im 1/3]
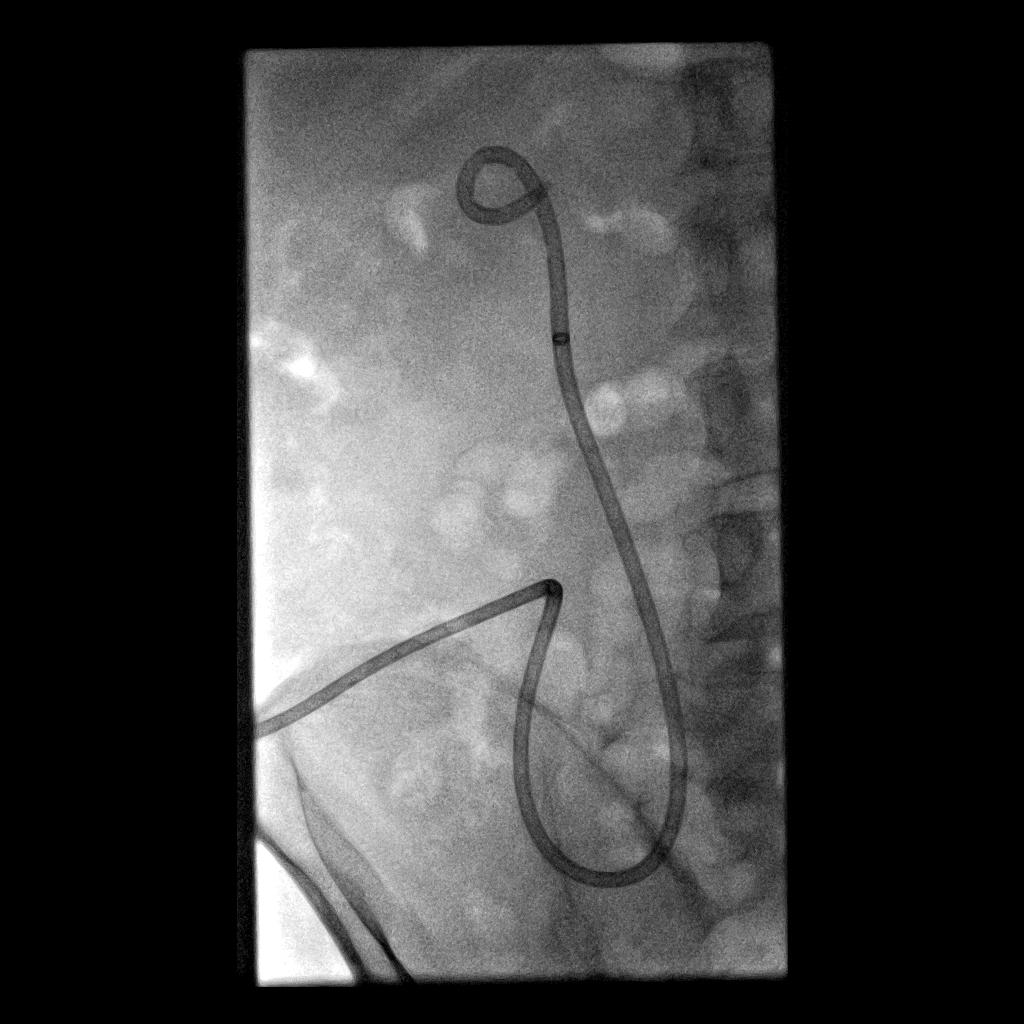
[im 2/3]
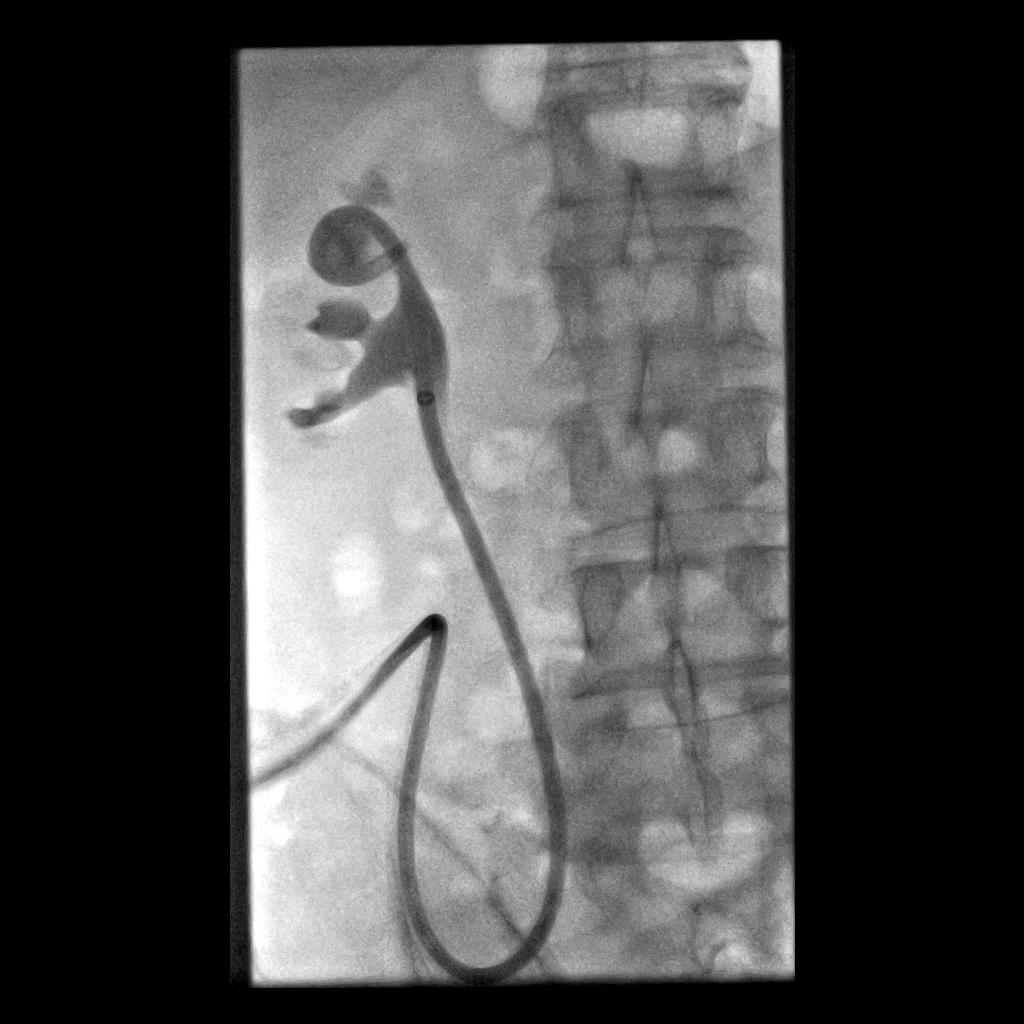
[im 3/3]
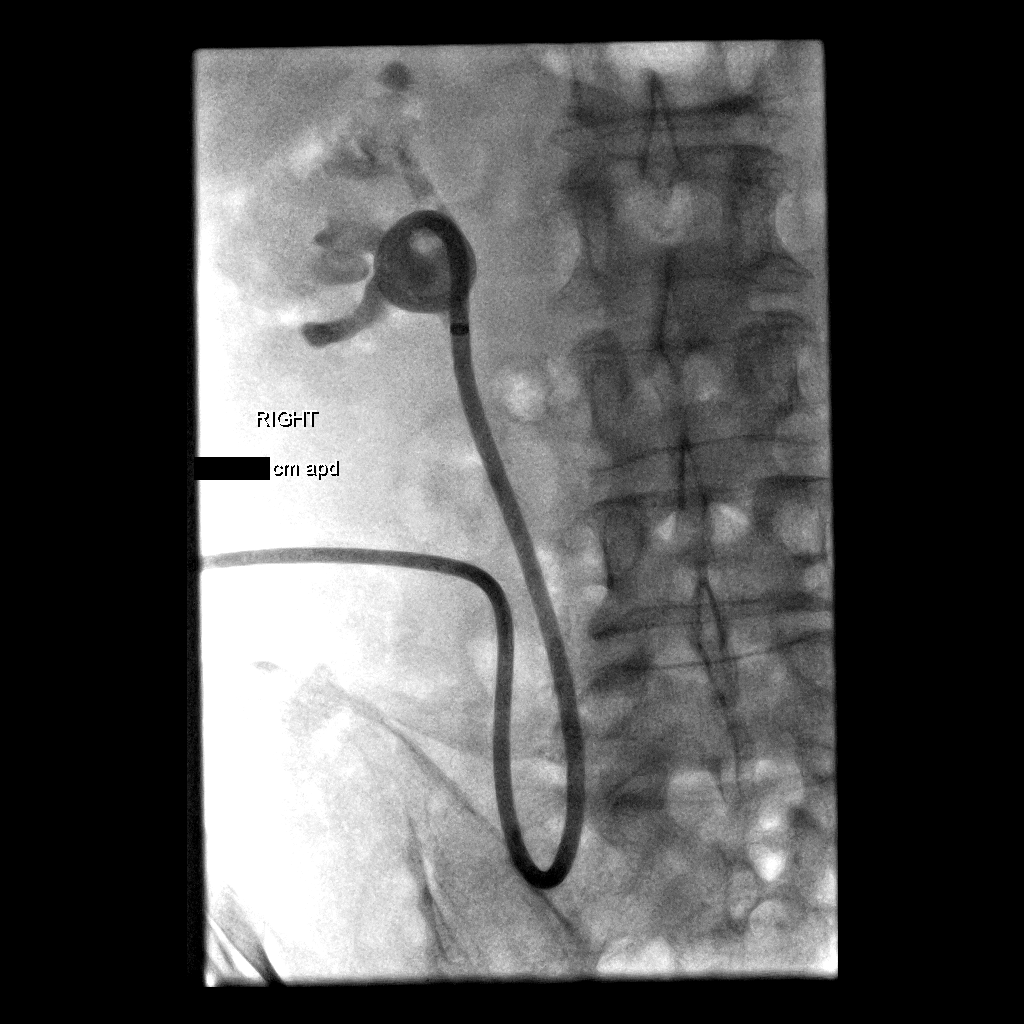

[3 of 3 positions shown; findings below may reference images not displayed]

CONTRAST:  10 mL Omnipaque 300 administered into the collecting
system

FLUOROSCOPY TIME:  5 minutes 30 seconds (76 mGy).

COMPLICATIONS:
None immediate
The external portion of the retrograde nephro ureteral catheter,
urostomy and surrounding skin were prepped and draped in the usual
sterile fashion. A sterile drape was applied covering the operative
field. Maximum barrier sterile technique with sterile gowns and
gloves were used for the procedure. A timeout was performed prior to
the initiation of the procedure.

A pre procedural spot fluoroscopic image was obtained after contrast
was injected via the existing retrograde nephro ureteral catheter
demonstrating appropriate positioning within the renal pelvis. The
existing nephrostomy catheter was cut and cannulated with a stiff
Amplatz.

As there was difficulty advancing the Amplatz wire through the
distal end hole of the nephro ureteral catheter, a Kumpe the
catheter was cannulated within the nephro ureteral catheter to allow
for advancement of the Amplatz wire out the distal end hole. Under
intermittent fluoroscopic guidance, the existing nephrostomy
catheter was exchanged for a new 12 French, 45 cm all-purpose
drainage catheter. Contrast injection confirmed appropriate
positioning within the renal pelvis and a post exchange fluoroscopic
image was obtained. The catheter was locked and secured to the skin
with a suture. A dressing was placed. The patient tolerated the
procedure well without immediate postprocedural complication.
FINDINGS: The existing nephro ureteral catheter is appropriately positioned
and functioning.

The patient's urostomy is again noted to be tortuous.

After successful fluoroscopic guided exchange, the new nephro
ureteral catheter is coiled and locked within the right renal
pelvis.
IMPRESSION: Successful fluoroscopic guided exchange of right sided 12 French, 45
cm nephro ureteral catheter.

As detailed above, there was difficulty advancing the Amplatz wire
through the distal end of the nephro ureteral catheter - as such, a
Kumpe catheter was utilized to support the passage of the stiff
Amplatz through the distal end of the catheter and allowing passage
of the stiff segment of the Amplatz wire within the renal pelvis.

## 2016-05-22 IMAGING — XA IR BILIARY CATHETER EXCHANGE
1 series · 1 of 1 positions shown · non-contrast
Comparison: none

CLINICAL DATA: Right antegrade nephro ureteral routine catheter
exchange.

[Series 300: ir nephrostomy tube change · 1 of 1 slices shown]
[im 1/1]
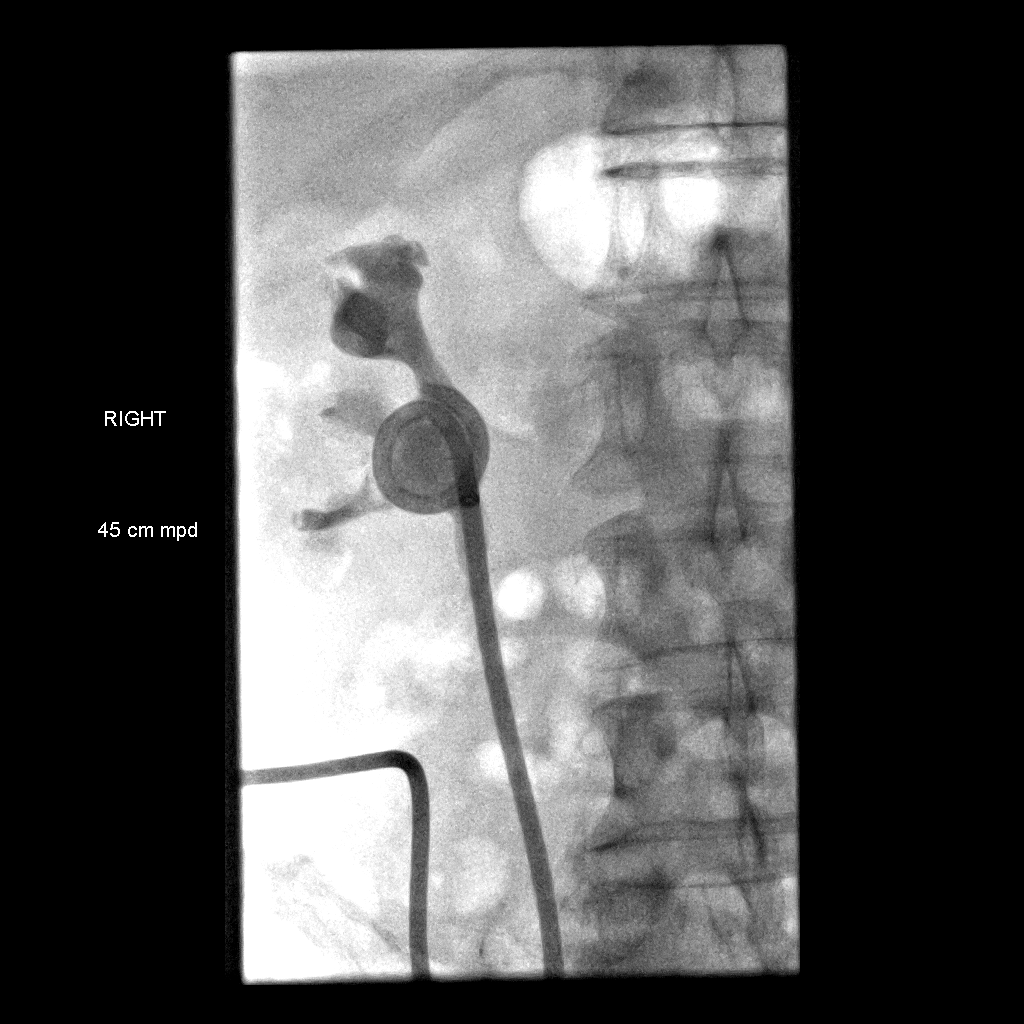

[1 of 1 positions shown; findings below may reference images not displayed]

EXAM:
PERC TUBE CHG W/CM

FLUOROSCOPY TIME:  1 minutes and 18 seconds.

MEDICATIONS AND MEDICAL HISTORY:
None

ANESTHESIA/SEDATION:
None

CONTRAST:  10 cc Omnipaque 300

PROCEDURE:
The procedure, risks, benefits, and alternatives were explained to
the patient. Questions regarding the procedure were encouraged and
answered. The patient understands and consents to the procedure.

The right lower quadrant was prepped with Betadine in a sterile
fashion, and a sterile drape was applied covering the operative
field. A sterile gown and sterile gloves were used for the
procedure.

The existing 12 French catheter was cut and exchanged over a Coons
wire for a new 12 French nephro ureteral catheter. It was looped and
string fixed in the renal pelvis. Contrast was injected.
FINDINGS: The image confirms exchange of the right nephro ureteral catheter.
The tip is coiled in the renal pelvis.

COMPLICATIONS:
None
IMPRESSION: Successful right 12 French nephro ureteral catheter exchange.

## 2016-05-27 ENCOUNTER — Other Ambulatory Visit: Payer: Self-pay | Admitting: Urology

## 2016-05-27 ENCOUNTER — Encounter (HOSPITAL_COMMUNITY): Payer: Self-pay | Admitting: Interventional Radiology

## 2016-05-27 ENCOUNTER — Ambulatory Visit (HOSPITAL_COMMUNITY)
Admission: RE | Admit: 2016-05-27 | Discharge: 2016-05-27 | Disposition: A | Discharge status: Home / Self Care | Payer: Medicare HMO | Source: Ambulatory Visit | Attending: Urology | Admitting: Urology

## 2016-05-27 DIAGNOSIS — C679 Malignant neoplasm of bladder, unspecified: Secondary | ICD-10-CM | POA: Insufficient documentation

## 2016-05-27 DIAGNOSIS — Z436 Encounter for attention to other artificial openings of urinary tract: Secondary | ICD-10-CM | POA: Diagnosis present

## 2016-05-27 DIAGNOSIS — N135 Crossing vessel and stricture of ureter without hydronephrosis: Secondary | ICD-10-CM | POA: Diagnosis not present

## 2016-05-27 HISTORY — PX: IR GENERIC HISTORICAL: IMG1180011

## 2016-05-27 MED ORDER — IOPAMIDOL (ISOVUE-300) INJECTION 61%
INTRAVENOUS | Status: AC
  Administered 2016-05-27: 25 mL via INTRAVENOUS
  Filled 2016-05-27: qty 50

## 2016-05-27 MED ORDER — IOPAMIDOL (ISOVUE-300) INJECTION 61%
50.0000 mL | Freq: Once | INTRAVENOUS | Status: AC | PRN
  Administered 2016-05-27: 25 mL via INTRAVENOUS

## 2016-06-05 ENCOUNTER — Ambulatory Visit (INDEPENDENT_AMBULATORY_CARE_PROVIDER_SITE_OTHER): Payer: Medicare HMO | Admitting: *Deleted

## 2016-06-05 DIAGNOSIS — R001 Bradycardia, unspecified: Secondary | ICD-10-CM

## 2016-06-05 LAB — CUP PACEART INCLINIC DEVICE CHECK
Battery Remaining Longevity: 43 mo
Brady Statistic AP VS Percent: 71 %
Brady Statistic AS VP Percent: 0 %
Brady Statistic AS VS Percent: 1 %
Implantable Lead Implant Date: 20090917
Implantable Lead Location: 753859
Implantable Lead Location: 753860
Implantable Lead Model: 5076
Lead Channel Impedance Value: 584 Ohm
Lead Channel Pacing Threshold Amplitude: 0.5 V
Lead Channel Pacing Threshold Pulse Width: 0.4 ms
Lead Channel Pacing Threshold Pulse Width: 0.4 ms
Lead Channel Sensing Intrinsic Amplitude: 5.6 mV
Lead Channel Setting Pacing Pulse Width: 0.4 ms
MDC IDC LEAD IMPLANT DT: 20090917
MDC IDC MSMT BATTERY IMPEDANCE: 1458 Ohm
MDC IDC MSMT BATTERY VOLTAGE: 2.76 V
MDC IDC MSMT LEADCHNL RA IMPEDANCE VALUE: 460 Ohm
MDC IDC MSMT LEADCHNL RA PACING THRESHOLD AMPLITUDE: 0.5 V
MDC IDC PG IMPLANT DT: 20090917
MDC IDC SESS DTM: 20180314150640
MDC IDC SET LEADCHNL RA PACING AMPLITUDE: 2 V
MDC IDC SET LEADCHNL RV PACING AMPLITUDE: 2.5 V
MDC IDC SET LEADCHNL RV SENSING SENSITIVITY: 2 mV
MDC IDC STAT BRADY AP VP PERCENT: 28 %

## 2016-06-05 NOTE — Progress Notes (Signed)
Pacemaker check in clinic. Normal device function. Thresholds, sensing, impedances consistent with previous measurements. Device programmed to maximize longevity. 7 mode switches (<0.1% burden)+ASA, no EGMs. No high ventricular rates noted. Device programmed at appropriate safety margins. Histogram distribution appropriate for patient activity level. Device programmed to optimize intrinsic conduction. Estimated longevity 3.5 years. Patient education completed. ROV with GT 11/2016.

## 2016-06-17 ENCOUNTER — Encounter (INDEPENDENT_AMBULATORY_CARE_PROVIDER_SITE_OTHER): Payer: Medicare HMO | Admitting: Ophthalmology

## 2016-06-17 DIAGNOSIS — H43813 Vitreous degeneration, bilateral: Secondary | ICD-10-CM

## 2016-06-17 DIAGNOSIS — H353114 Nonexudative age-related macular degeneration, right eye, advanced atrophic with subfoveal involvement: Secondary | ICD-10-CM

## 2016-06-17 DIAGNOSIS — H35033 Hypertensive retinopathy, bilateral: Secondary | ICD-10-CM

## 2016-06-17 DIAGNOSIS — H353231 Exudative age-related macular degeneration, bilateral, with active choroidal neovascularization: Secondary | ICD-10-CM

## 2016-06-17 DIAGNOSIS — I10 Essential (primary) hypertension: Secondary | ICD-10-CM

## 2016-06-19 IMAGING — XA IR BILIARY CATHETER EXCHANGE
1 series · 1 of 1 positions shown · IV contrast (omnipaque)
Comparison: 08/04/2014

EXAM:
RIGHT NEPHROURETERAL CATHETER EXCHANGE VIA ILEOSTOMY

FLUOROSCOPY TIME:  60 SECONDS, 16.9 MGY
CONTRAST:  10mL OMNIPAQUE IOHEXOL 300 MG/ML  SOLN

[Series 300: ir nephrostomy tube change · 1 of 1 slices shown]
[im 1/1]
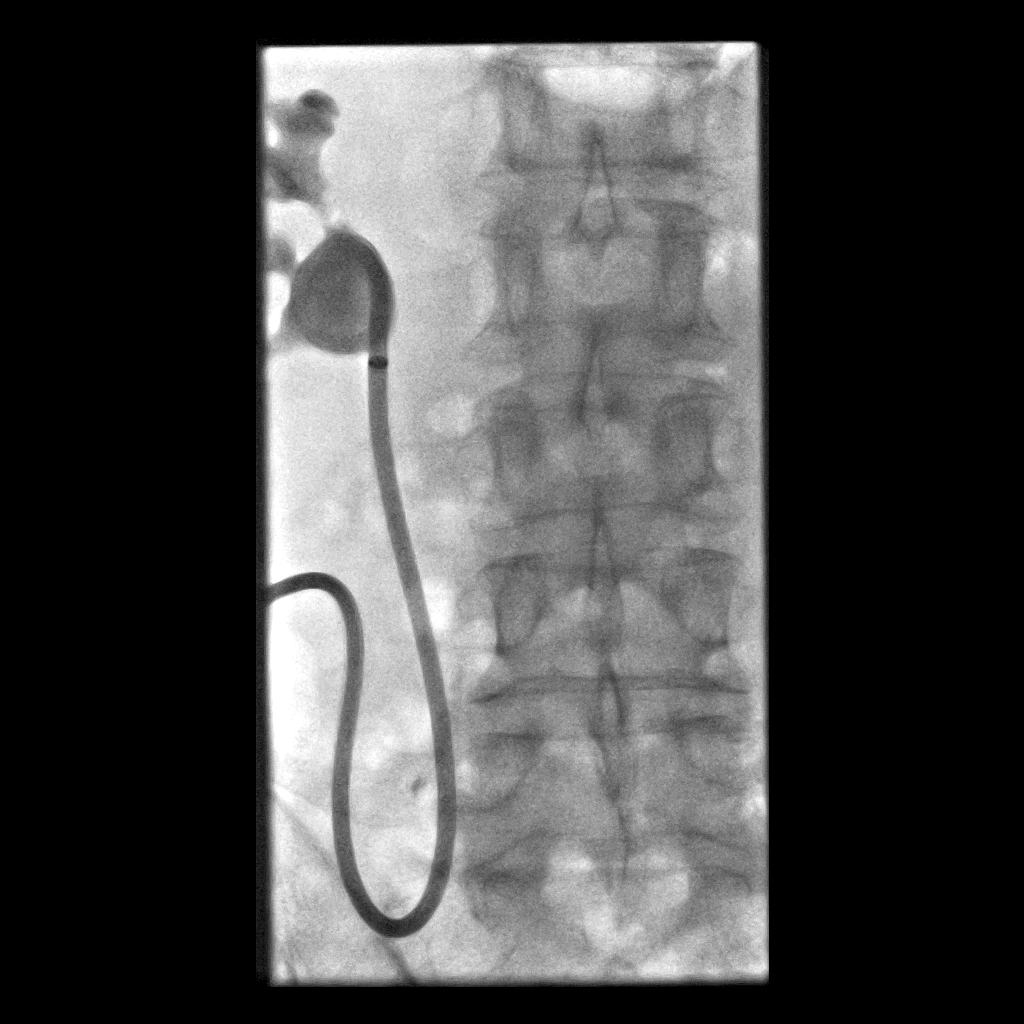

[1 of 1 positions shown; findings below may reference images not displayed]

PROCEDURE:
The procedure, risks (including but not limited to bleeding,
infection, organ damage ), benefits, and alternatives were explained
to the patient. Questions regarding the procedure were encouraged
and answered. The patient understands and consents to the procedure.
Preexisting catheter was prepped. It was removed over a guidewire. A
new 12-Frenchnephroureteral catheter was advanced over a wire. The
catheter was formed at the level of the renal pelvis. Contrast
injection was performed and a fluoroscopic spot image obtained to
confirm position. Exiting catheter was then placed in an ileostomy
bag.
IMPRESSION: Exchange of indwelling right 12-French nephroureteral catheter via
ileostomy.

## 2016-06-25 DIAGNOSIS — C679 Malignant neoplasm of bladder, unspecified: Secondary | ICD-10-CM | POA: Diagnosis not present

## 2016-06-25 DIAGNOSIS — Z936 Other artificial openings of urinary tract status: Secondary | ICD-10-CM | POA: Diagnosis not present

## 2016-07-01 ENCOUNTER — Other Ambulatory Visit: Payer: Self-pay | Admitting: Urology

## 2016-07-01 ENCOUNTER — Ambulatory Visit (HOSPITAL_COMMUNITY)
Admission: RE | Admit: 2016-07-01 | Discharge: 2016-07-01 | Disposition: A | Discharge status: Home / Self Care | Payer: Medicare HMO | Source: Ambulatory Visit | Attending: Urology | Admitting: Urology

## 2016-07-01 ENCOUNTER — Other Ambulatory Visit (HOSPITAL_COMMUNITY): Payer: Self-pay | Admitting: Diagnostic Radiology

## 2016-07-01 ENCOUNTER — Encounter (HOSPITAL_COMMUNITY): Payer: Self-pay | Admitting: Diagnostic Radiology

## 2016-07-01 DIAGNOSIS — C61 Malignant neoplasm of prostate: Secondary | ICD-10-CM | POA: Insufficient documentation

## 2016-07-01 DIAGNOSIS — N135 Crossing vessel and stricture of ureter without hydronephrosis: Secondary | ICD-10-CM

## 2016-07-01 DIAGNOSIS — Z436 Encounter for attention to other artificial openings of urinary tract: Secondary | ICD-10-CM | POA: Diagnosis not present

## 2016-07-01 DIAGNOSIS — Z452 Encounter for adjustment and management of vascular access device: Secondary | ICD-10-CM | POA: Insufficient documentation

## 2016-07-01 DIAGNOSIS — C679 Malignant neoplasm of bladder, unspecified: Secondary | ICD-10-CM | POA: Insufficient documentation

## 2016-07-01 HISTORY — PX: IR NEPHROSTOMY TUBE CHANGE: IMG1442

## 2016-07-01 MED ORDER — IOPAMIDOL (ISOVUE-300) INJECTION 61%
INTRAVENOUS | Status: AC
Start: 2016-07-01 — End: 2016-07-01
  Administered 2016-07-01: 10 mL
  Filled 2016-07-01: qty 50

## 2016-07-01 NOTE — Procedures (Signed)
Successful exchange of right nephroureteral catheter.  14 Fr tube in right renal pelvis.  Minimal blood loss.  No immediate complication.

## 2016-07-17 IMAGING — XA IR BILIARY CATHETER EXCHANGE
2 series · 6 of 6 positions shown · non-contrast
Comparison: none

CLINICAL DATA: Chronic ureteral anastomotic stricture, chronic
indwelling nephro ureteral catheter through the existing ileal
conduit

[Series 2: fl - angio · 4 of 33 frames shown]
[frame 5/33]
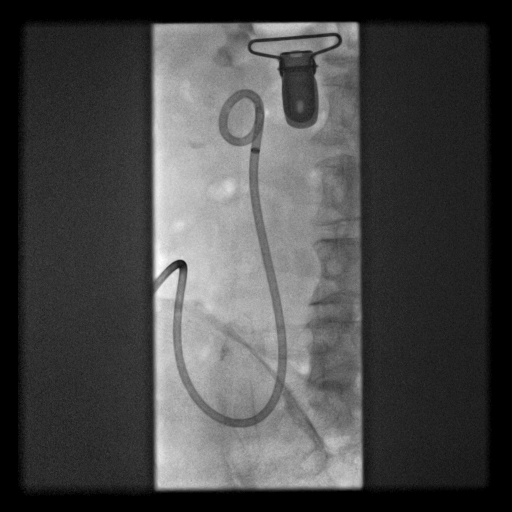
[frame 7/33]
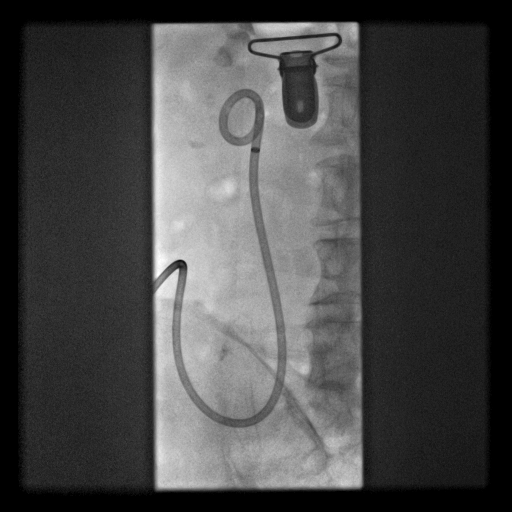
[frame 17/33]
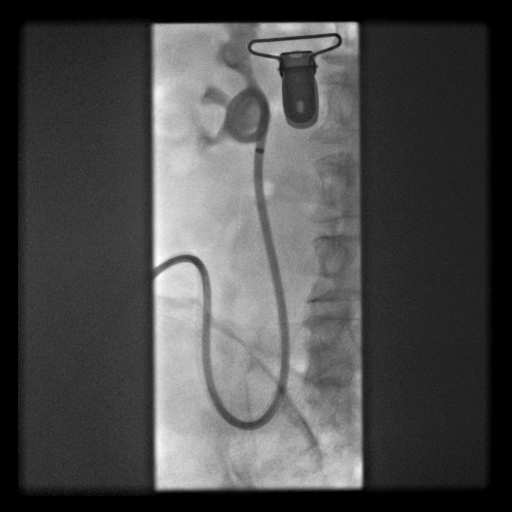
[frame 29/33]
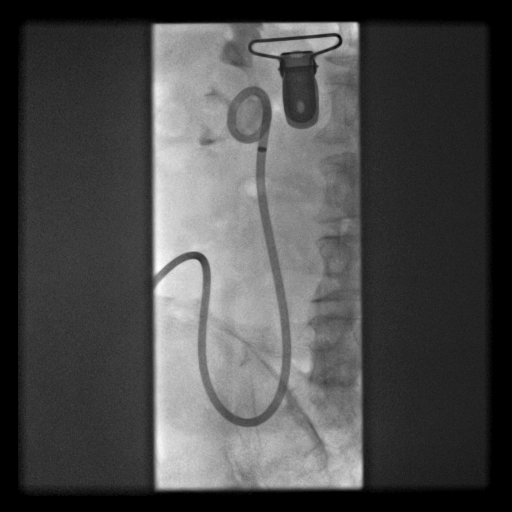

[Series 300: ir nephrostomy tube change · 2 of 2 slices shown]
[im 1/2]
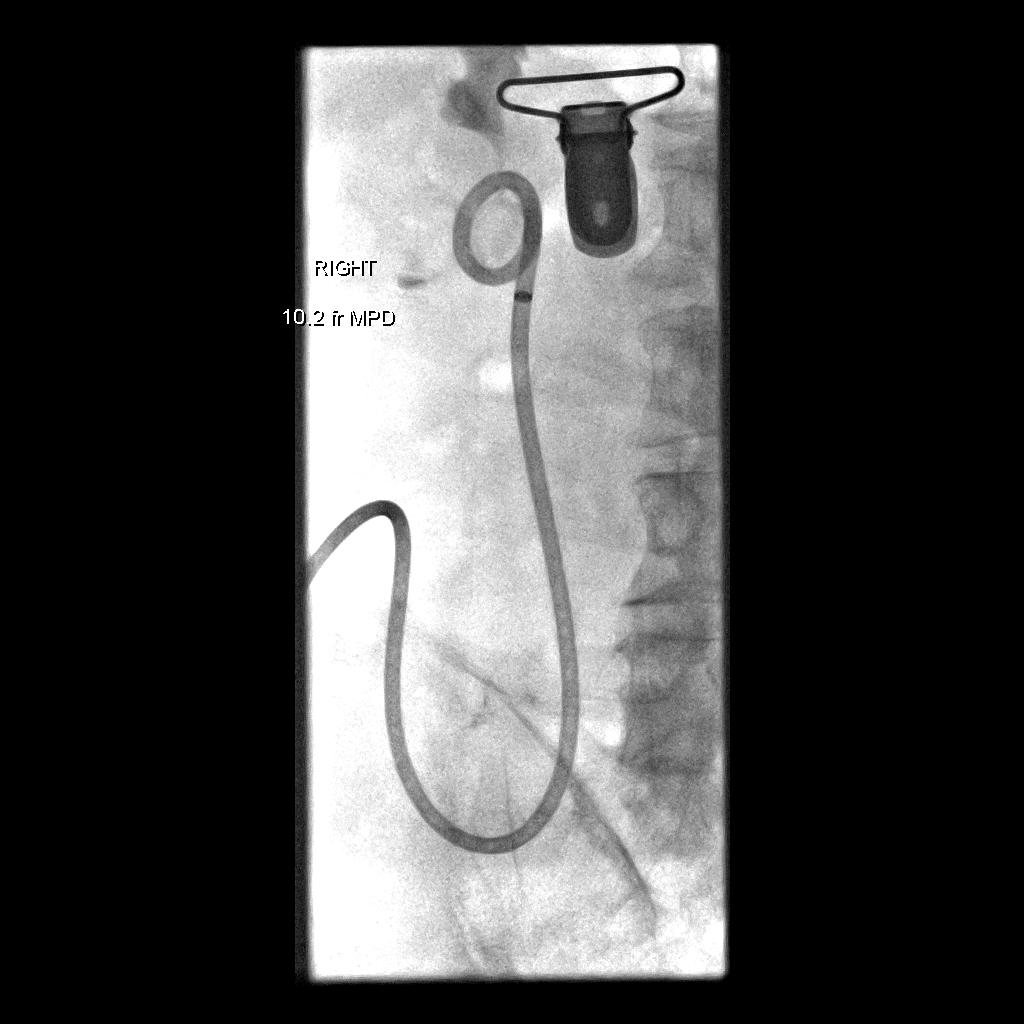
[im 2/2]
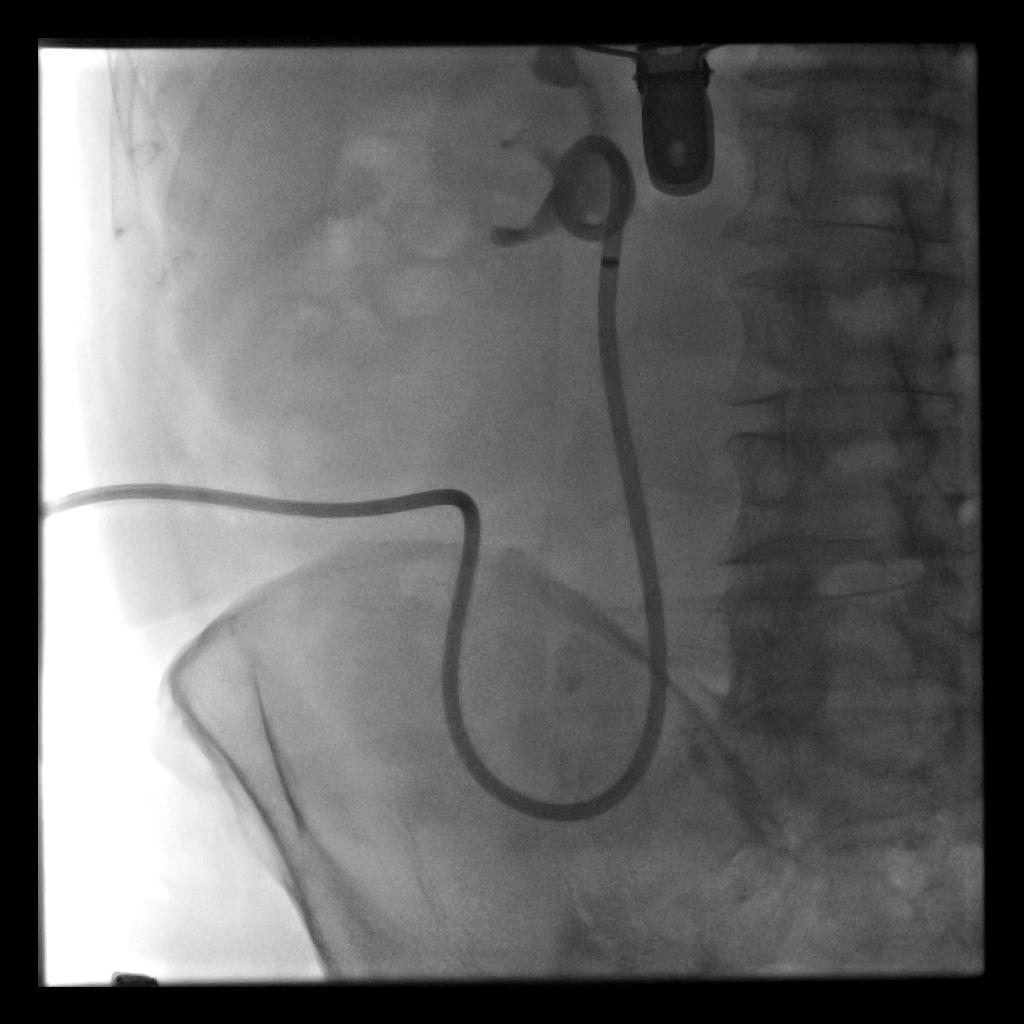

[6 of 6 positions shown; findings below may reference images not displayed]

EXAM:
RIGHT NEPHRO URETERAL CATHETER EXCHANGE

Physician: Adalbertus Fedor

FLUOROSCOPY TIME:  2 minutes 36 seconds, 38 mGy

PROCEDURE:
Under sterile conditions, the existing 12 French nephro ureteral
catheter was injected to confirm position. Catheter was cut and
removed over a stiff Glidewire. A new 12 French nephro ureteral
catheter was advanced with the retention loop formed in the renal
pelvis. Position confirmed with fluoroscopy. Images obtained for
documentation.
FINDINGS: Imaging confirms exchange of the 12 French right nephro ureteral
catheter
IMPRESSION: Successful fluoroscopic exchange of the nephro ureteral catheter

## 2016-08-05 ENCOUNTER — Other Ambulatory Visit (HOSPITAL_COMMUNITY): Payer: Self-pay | Admitting: Interventional Radiology

## 2016-08-05 ENCOUNTER — Other Ambulatory Visit (HOSPITAL_COMMUNITY): Payer: Self-pay | Admitting: Diagnostic Radiology

## 2016-08-05 ENCOUNTER — Encounter (HOSPITAL_COMMUNITY): Payer: Self-pay | Admitting: Interventional Radiology

## 2016-08-05 ENCOUNTER — Ambulatory Visit (HOSPITAL_COMMUNITY)
Admission: RE | Admit: 2016-08-05 | Discharge: 2016-08-05 | Disposition: A | Discharge status: Home / Self Care | Payer: Medicare HMO | Source: Ambulatory Visit | Attending: Diagnostic Radiology | Admitting: Diagnostic Radiology

## 2016-08-05 DIAGNOSIS — N135 Crossing vessel and stricture of ureter without hydronephrosis: Secondary | ICD-10-CM

## 2016-08-05 DIAGNOSIS — Z436 Encounter for attention to other artificial openings of urinary tract: Secondary | ICD-10-CM | POA: Insufficient documentation

## 2016-08-05 HISTORY — PX: IR NEPHROSTOMY TUBE CHANGE: IMG1442

## 2016-08-05 MED ORDER — IOPAMIDOL (ISOVUE-300) INJECTION 61%
INTRAVENOUS | Status: AC
  Administered 2016-08-05: 20 mL
  Filled 2016-08-05: qty 50

## 2016-08-05 MED ORDER — IOPAMIDOL (ISOVUE-300) INJECTION 61%
50.0000 mL | Freq: Once | INTRAVENOUS | Status: AC | PRN
  Administered 2016-08-05: 20 mL

## 2016-08-14 IMAGING — XA IR BILIARY CATHETER EXCHANGE
1 series · 3 of 3 positions shown · non-contrast
Comparison: none

CLINICAL DATA: Chronic ureteral stricture, chronic indwelling
nephro ureteral catheter through the existing ostomy

[Series 300: ir nephrostomy tube change · 3 of 3 slices shown]
[im 1/3]
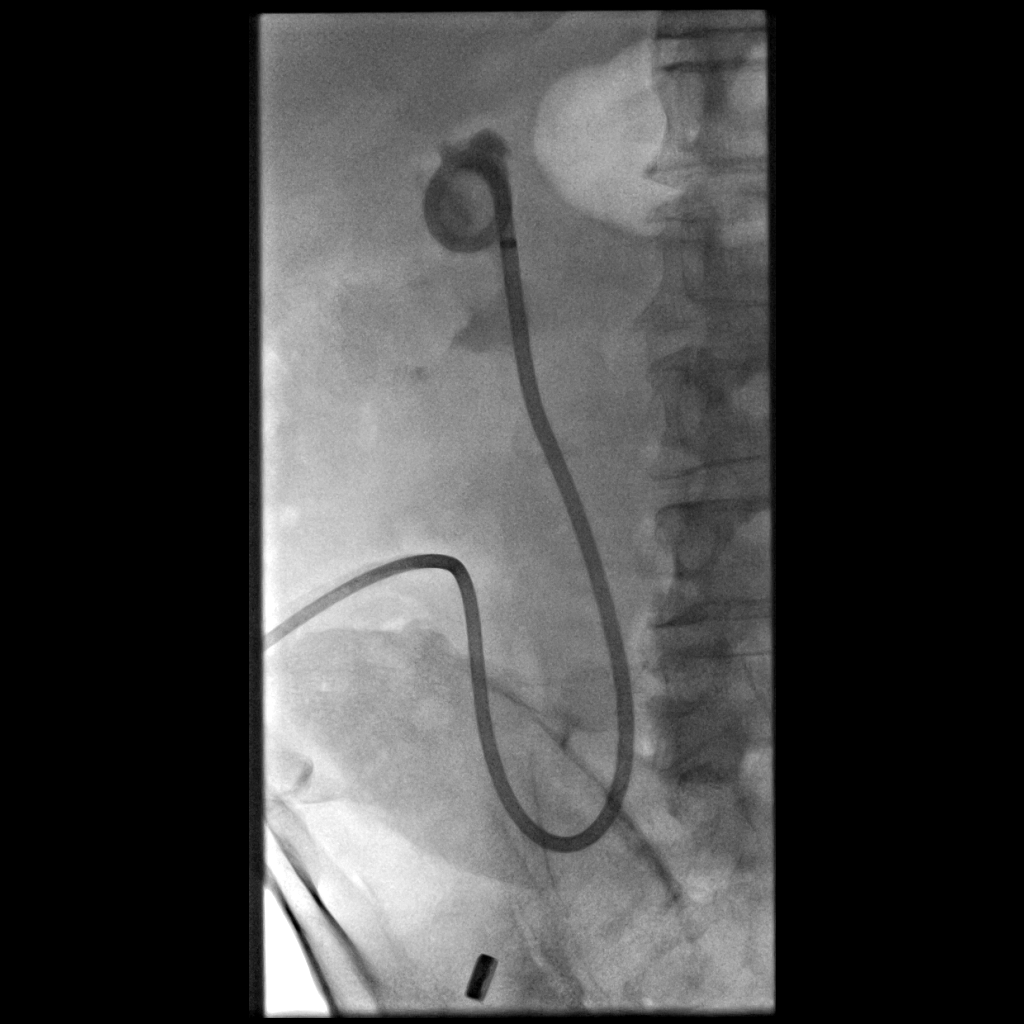
[im 2/3]
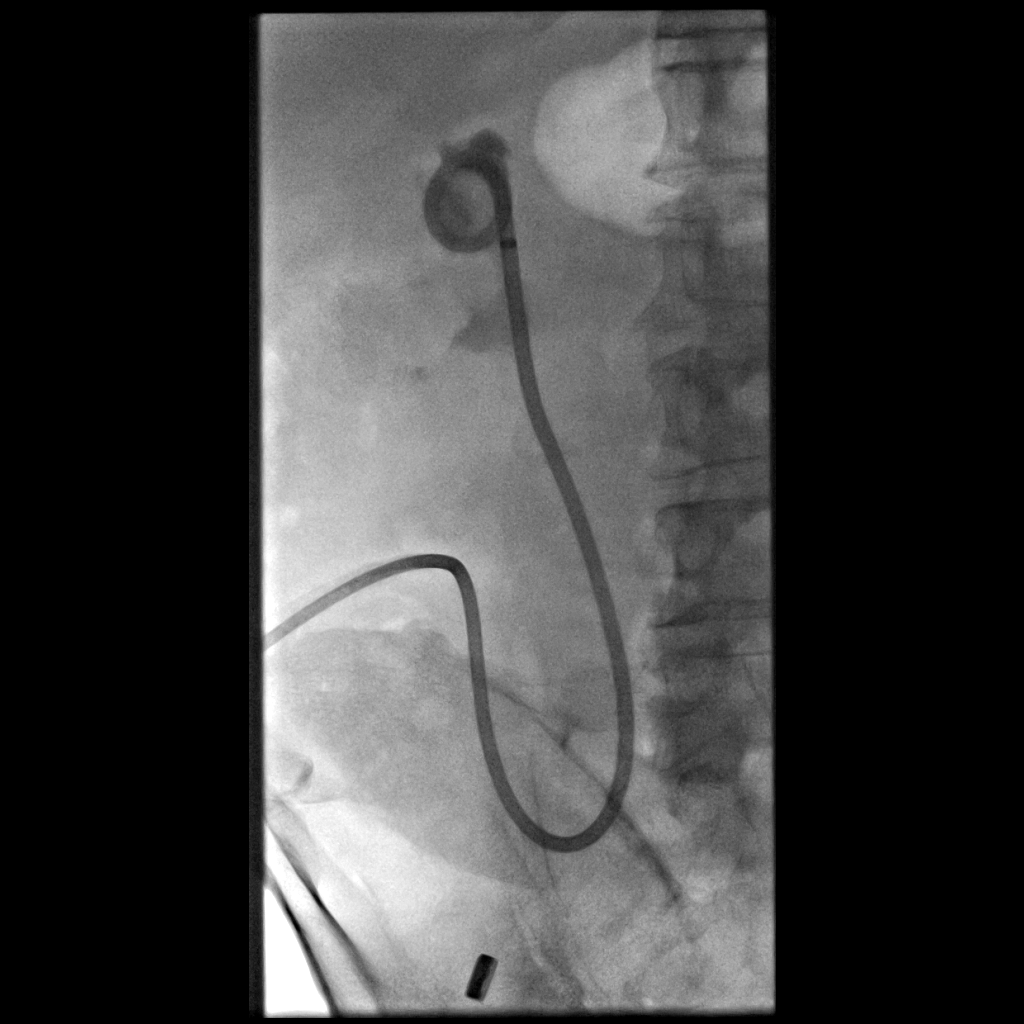
[im 3/3]
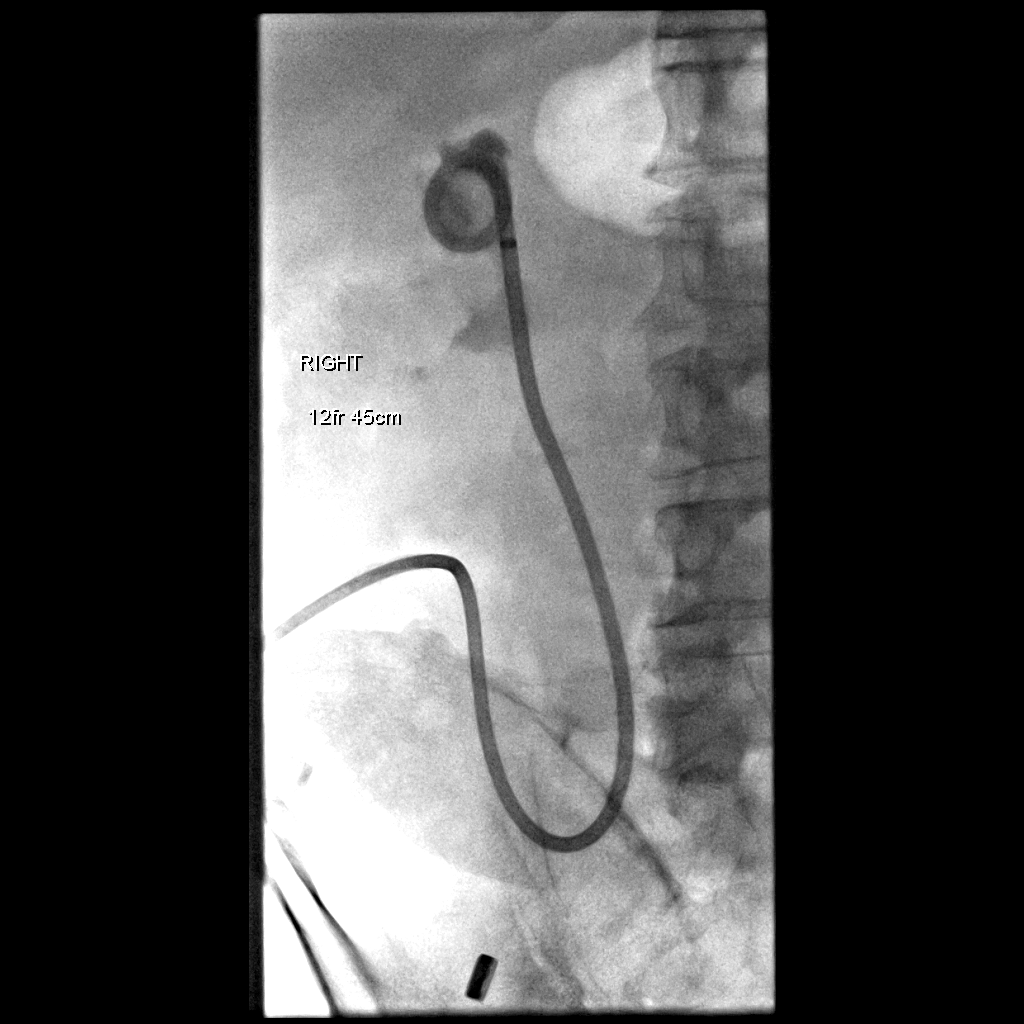

[3 of 3 positions shown; findings below may reference images not displayed]

EXAM:
FLUOROSCOPIC EXCHANGE OF THE 12 FRENCH NEPHRO URETERAL CATHETER

Date:  [DATE] [DATE]

Radiologist:  Nosbusch, Nelson-Mila

Guidance:  FLUOROSCOPIC

FLUOROSCOPY TIME:  2 minutes 6 seconds, 23.9 mGy

MEDICATIONS AND MEDICAL HISTORY:
None.

ANESTHESIA/SEDATION:
None.

CONTRAST:  10 cc Omnipaque 300

COMPLICATIONS:
None immediate

PROCEDURE:
Informed consent was obtained from the patient following explanation
of the procedure, risks, benefits and alternatives. The patient
understands, agrees and consents for the procedure. All questions
were addressed. A time out was performed.

Maximal barrier sterile technique utilized including caps, mask,
sterile gowns, sterile gloves, large sterile drape, hand hygiene,
and Betadine.

Existing nephro ureteral catheter was injected with contrast to
confirm position. Catheter was cut and removed over a stiff
Glidewire. A new 45 cm 12 French catheter was advanced with the
retention loop formed in a minimally dilated upper pole calyx.
Position confirmed with fluoroscopy. Images obtained for
documentation.
IMPRESSION: Successful fluoroscopic exchange of the 12 French right nephro
ureteral catheter through the existing ostomy.

## 2016-08-26 ENCOUNTER — Encounter (INDEPENDENT_AMBULATORY_CARE_PROVIDER_SITE_OTHER): Payer: Medicare HMO | Admitting: Ophthalmology

## 2016-08-26 DIAGNOSIS — H43813 Vitreous degeneration, bilateral: Secondary | ICD-10-CM

## 2016-08-26 DIAGNOSIS — H353221 Exudative age-related macular degeneration, left eye, with active choroidal neovascularization: Secondary | ICD-10-CM

## 2016-08-26 DIAGNOSIS — H353114 Nonexudative age-related macular degeneration, right eye, advanced atrophic with subfoveal involvement: Secondary | ICD-10-CM | POA: Diagnosis not present

## 2016-08-26 DIAGNOSIS — I10 Essential (primary) hypertension: Secondary | ICD-10-CM | POA: Diagnosis not present

## 2016-08-26 DIAGNOSIS — H35033 Hypertensive retinopathy, bilateral: Secondary | ICD-10-CM

## 2016-09-03 DIAGNOSIS — H3530 Unspecified macular degeneration: Secondary | ICD-10-CM | POA: Diagnosis not present

## 2016-09-03 DIAGNOSIS — R011 Cardiac murmur, unspecified: Secondary | ICD-10-CM | POA: Diagnosis not present

## 2016-09-03 DIAGNOSIS — M1A9XX0 Chronic gout, unspecified, without tophus (tophi): Secondary | ICD-10-CM | POA: Diagnosis not present

## 2016-09-03 DIAGNOSIS — Z6824 Body mass index (BMI) 24.0-24.9, adult: Secondary | ICD-10-CM | POA: Diagnosis not present

## 2016-09-03 DIAGNOSIS — I10 Essential (primary) hypertension: Secondary | ICD-10-CM | POA: Diagnosis not present

## 2016-09-03 DIAGNOSIS — Z87891 Personal history of nicotine dependence: Secondary | ICD-10-CM | POA: Diagnosis not present

## 2016-09-03 DIAGNOSIS — Z950 Presence of cardiac pacemaker: Secondary | ICD-10-CM | POA: Diagnosis not present

## 2016-09-03 DIAGNOSIS — M130 Polyarthritis, unspecified: Secondary | ICD-10-CM | POA: Diagnosis not present

## 2016-09-03 DIAGNOSIS — Z936 Other artificial openings of urinary tract status: Secondary | ICD-10-CM | POA: Diagnosis not present

## 2016-09-03 DIAGNOSIS — Z792 Long term (current) use of antibiotics: Secondary | ICD-10-CM | POA: Diagnosis not present

## 2016-09-03 DIAGNOSIS — Z974 Presence of external hearing-aid: Secondary | ICD-10-CM | POA: Diagnosis not present

## 2016-09-03 DIAGNOSIS — H9113 Presbycusis, bilateral: Secondary | ICD-10-CM | POA: Diagnosis not present

## 2016-09-03 DIAGNOSIS — H543 Unqualified visual loss, both eyes: Secondary | ICD-10-CM | POA: Diagnosis not present

## 2016-09-03 DIAGNOSIS — Z0000 Encounter for general adult medical examination without abnormal findings: Secondary | ICD-10-CM | POA: Diagnosis not present

## 2016-09-10 ENCOUNTER — Other Ambulatory Visit (HOSPITAL_COMMUNITY): Payer: Medicare HMO

## 2016-09-11 IMAGING — XA IR BILIARY CATHETER EXCHANGE
1 series · 2 of 2 positions shown · IV contrast (omnipaque)
Comparison: 08/27/2014 and earlier studies

:
CLINICAL DATA: Bladder carcinoma post cystoprostatectomy and ileal
conduit creation. Long-term indwelling retrograde nephro ureteral
catheter.

EXAM:
RIGHT NEPHROURETERAL CATHETER EXCHANGE VIA ILEOSTOMY
FLUOROSCOPY TIME: 54 seconds, 14 mGy seconds
CONTRAST: 5mL OMNIPAQUE IOHEXOL 300 MG/ML SOLN
TECHNIQUE: The procedure, risks (including but not limited to bleeding,
infection, organ damage ), benefits, and alternatives were explained
to the patient. Questions regarding the procedure were encouraged
and answered. The patient understands and consents to the procedure.
Preexisting catheter was prepped. It was removed over a Bentson
wire. A new 12-French 45 cm nephroureteral catheter was advanced
over a wire. The catheter was formed at the level of the
renal pelvis. Contrast injection was performed and a fluoroscopic
spot image obtained to confirm position. Exiting catheter was then
placed in an ileostomy bag.
COMPLICATIONS:
None immediate

[Series 300: ir nephrostomy tube change · 2 of 2 slices shown]
[im 1/2]
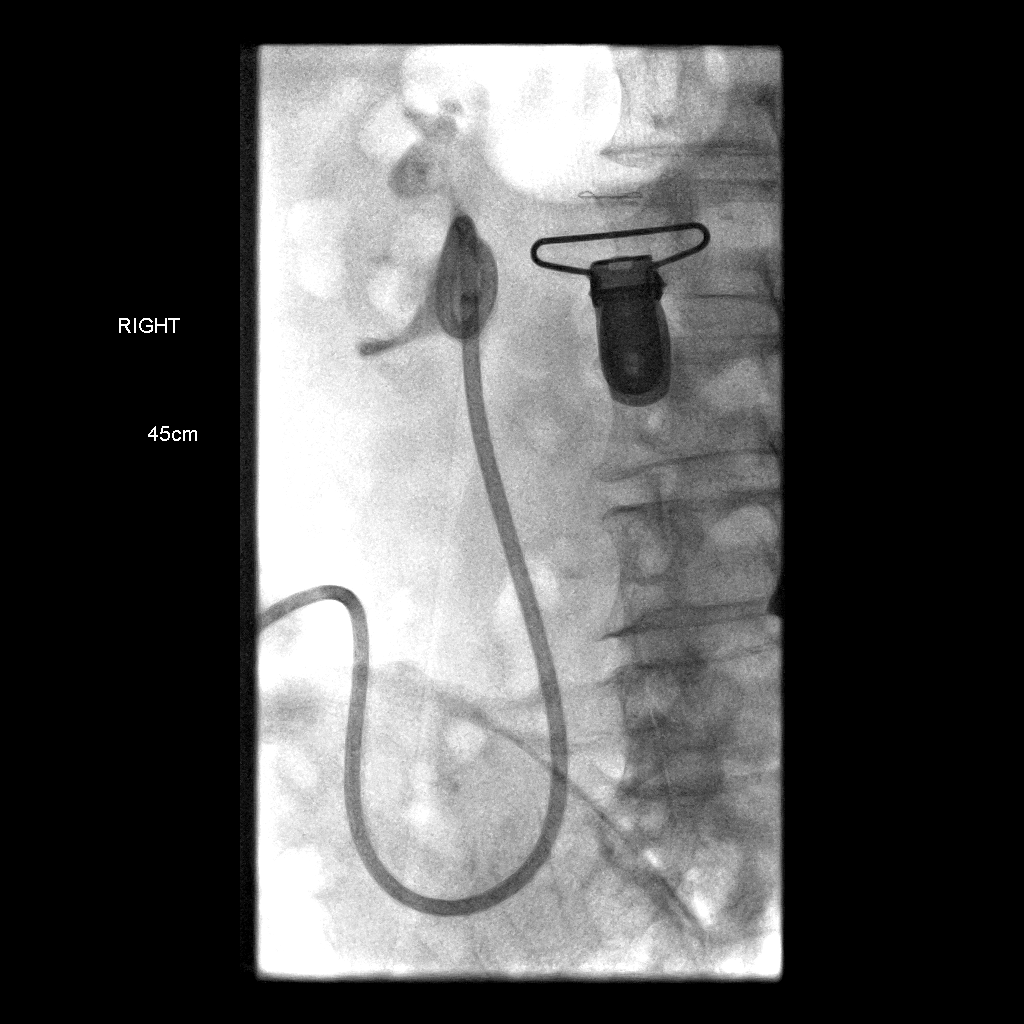
[im 2/2]
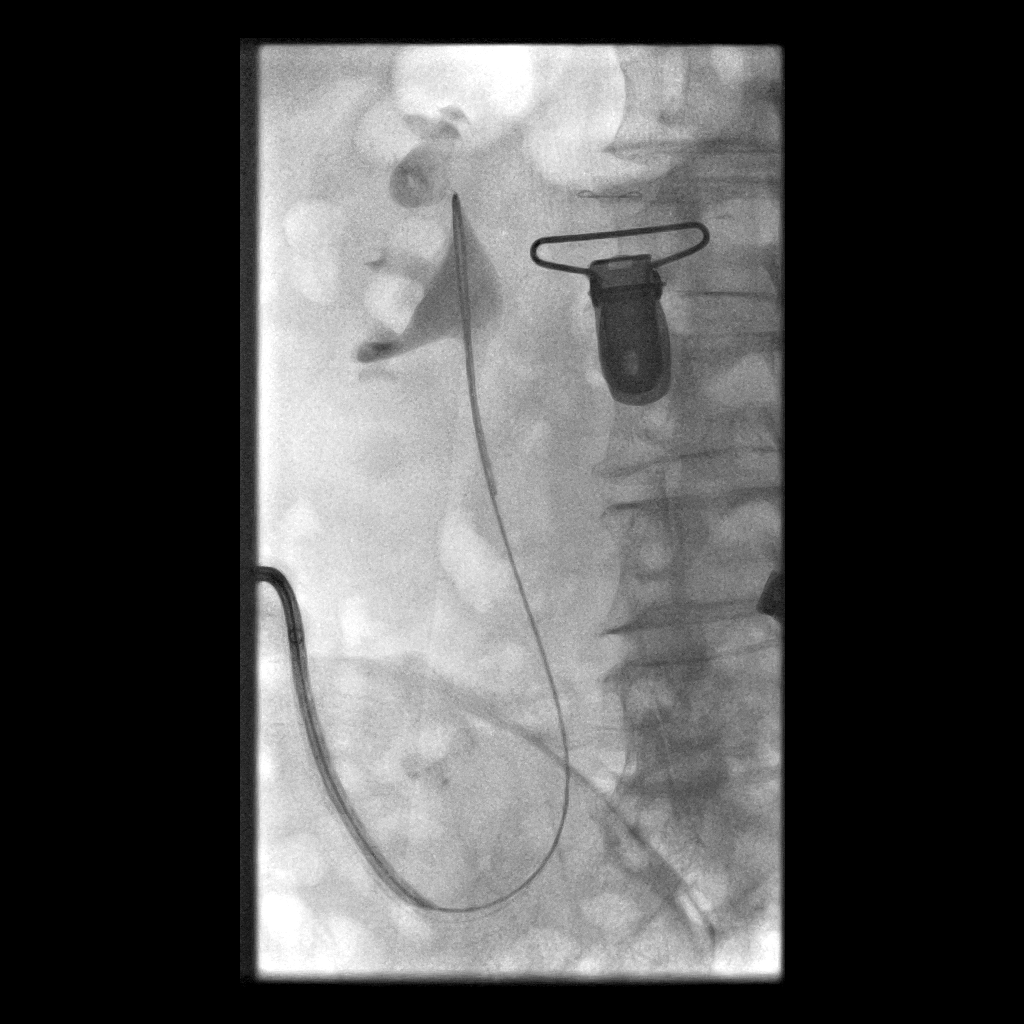

[2 of 2 positions shown; findings below may reference images not displayed]

IMPRESSION: Exchange of indwelling right 12-French nephroureteral catheter via
ileostomy.

## 2016-09-12 ENCOUNTER — Other Ambulatory Visit (HOSPITAL_COMMUNITY): Payer: Medicare HMO

## 2016-09-18 ENCOUNTER — Encounter (HOSPITAL_COMMUNITY): Payer: Self-pay | Admitting: Interventional Radiology

## 2016-09-18 ENCOUNTER — Other Ambulatory Visit (HOSPITAL_COMMUNITY): Payer: Self-pay | Admitting: Interventional Radiology

## 2016-09-18 ENCOUNTER — Ambulatory Visit (HOSPITAL_COMMUNITY)
Admission: RE | Admit: 2016-09-18 | Discharge: 2016-09-18 | Disposition: A | Discharge status: Home / Self Care | Payer: Medicare HMO | Source: Ambulatory Visit | Attending: Interventional Radiology | Admitting: Interventional Radiology

## 2016-09-18 DIAGNOSIS — C679 Malignant neoplasm of bladder, unspecified: Secondary | ICD-10-CM | POA: Diagnosis not present

## 2016-09-18 DIAGNOSIS — Z436 Encounter for attention to other artificial openings of urinary tract: Secondary | ICD-10-CM | POA: Diagnosis not present

## 2016-09-18 DIAGNOSIS — N135 Crossing vessel and stricture of ureter without hydronephrosis: Secondary | ICD-10-CM

## 2016-09-18 HISTORY — PX: IR NEPHROSTOMY TUBE CHANGE: IMG1442

## 2016-09-18 MED ORDER — IOPAMIDOL (ISOVUE-300) INJECTION 61%
10.0000 mL | Freq: Once | INTRAVENOUS | Status: AC | PRN
  Administered 2016-09-18: 10 mL

## 2016-09-18 MED ORDER — IOPAMIDOL (ISOVUE-300) INJECTION 61%
INTRAVENOUS | Status: AC
Start: 2016-09-18 — End: 2016-09-18
  Administered 2016-09-18: 10 mL
  Filled 2016-09-18: qty 50

## 2016-09-19 ENCOUNTER — Other Ambulatory Visit (HOSPITAL_COMMUNITY): Payer: Medicare HMO

## 2016-10-09 DIAGNOSIS — R8299 Other abnormal findings in urine: Secondary | ICD-10-CM | POA: Diagnosis not present

## 2016-10-09 DIAGNOSIS — M109 Gout, unspecified: Secondary | ICD-10-CM | POA: Diagnosis not present

## 2016-10-09 DIAGNOSIS — R946 Abnormal results of thyroid function studies: Secondary | ICD-10-CM | POA: Diagnosis not present

## 2016-10-09 DIAGNOSIS — Z125 Encounter for screening for malignant neoplasm of prostate: Secondary | ICD-10-CM | POA: Diagnosis not present

## 2016-10-09 DIAGNOSIS — N184 Chronic kidney disease, stage 4 (severe): Secondary | ICD-10-CM | POA: Diagnosis not present

## 2016-10-09 DIAGNOSIS — R358 Other polyuria: Secondary | ICD-10-CM | POA: Diagnosis not present

## 2016-10-09 DIAGNOSIS — M859 Disorder of bone density and structure, unspecified: Secondary | ICD-10-CM | POA: Diagnosis not present

## 2016-10-09 DIAGNOSIS — E784 Other hyperlipidemia: Secondary | ICD-10-CM | POA: Diagnosis not present

## 2016-10-09 IMAGING — XA IR BILIARY CATHETER EXCHANGE
1 series · 1 of 1 positions shown · IV contrast (omnipaque)
Comparison: none

CLINICAL DATA: [AGE] male with chronic indwelling nephro
ureteral stent retrograde through right lower quadrant ileostomy

EXAM:
PERC TUBE CHG W/CM
Date: 12/22/2014
PROCEDURE:
1. Exchange of nephro ureteral tube
ANESTHESIA/SEDATION:
None
MEDICATIONS:
FLUOROSCOPY TIME:  54 seconds
16 mGy
CONTRAST:  5 mL Omnipaque 350
TECHNIQUE: Informed consent was obtained from the patient following explanation
of the procedure, risks, benefits and alternatives. The patient
understands, agrees and consents for the procedure. All questions
were addressed. A time out was performed.

[Series 300: ir nephrostomy tube change · 1 of 1 slices shown]
[im 1/1]
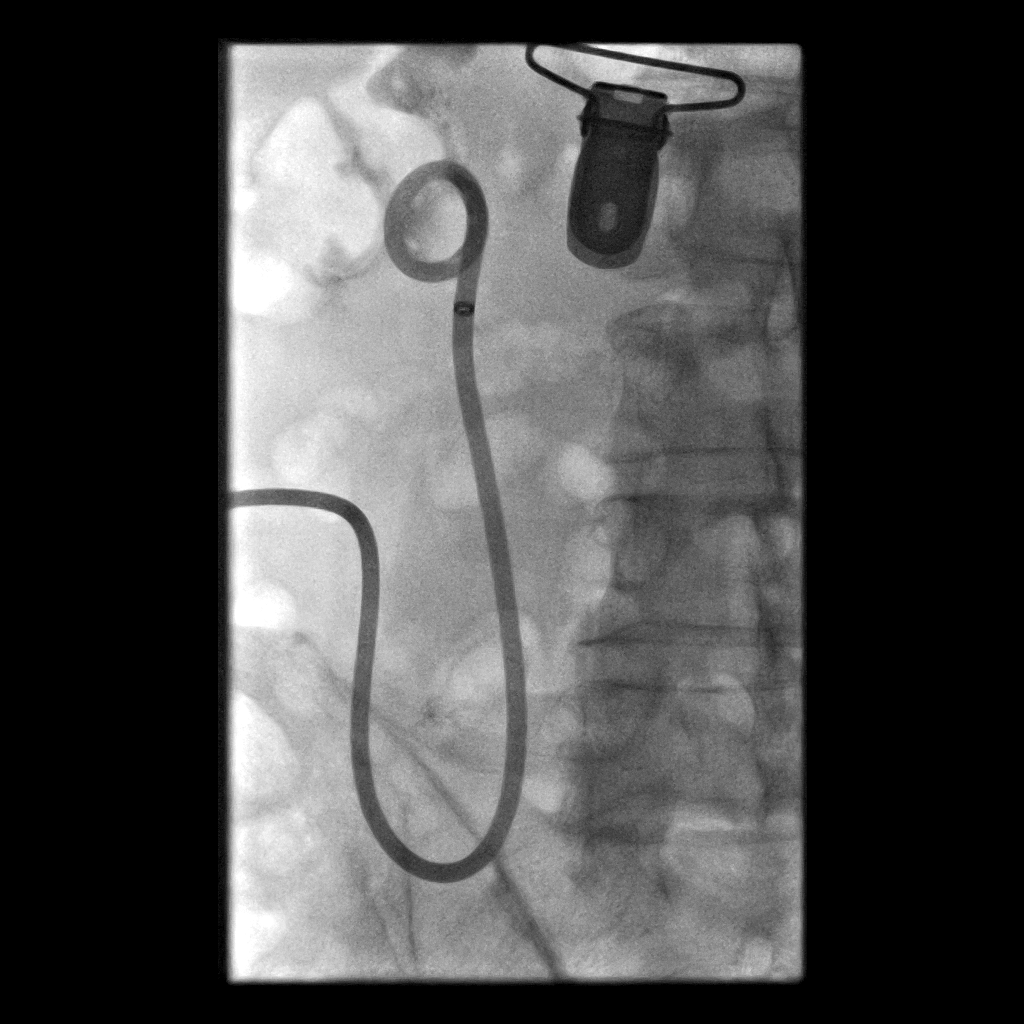

[1 of 1 positions shown; findings below may reference images not displayed]

Maximal barrier sterile technique utilized including caps, mask,
sterile gowns, sterile gloves, large sterile drape, hand hygiene,
and Betadine skin prep.

The existing tube was opacified by a gentle injection of contrast
material. The tube is well positioned within the renal pelvis. The
tube was transected and removed over a stiff Amplatz wire. A new 12
Sakeus Zocks Smolly drainage catheter was advanced over the wire
and formed with the locking loop in the renal pelvis. This was
confirmed with a fluoroscopic spot image. The patient tolerated the
procedure well. The external portion of the tube was placed back in
the ostomy bag.

COMPLICATIONS:
None
IMPRESSION: Successful routine exchange of right lower quadrant retrograde
nephro ureteral stent via existing ileostomy.

## 2016-10-16 DIAGNOSIS — C61 Malignant neoplasm of prostate: Secondary | ICD-10-CM | POA: Diagnosis not present

## 2016-10-16 DIAGNOSIS — Z936 Other artificial openings of urinary tract status: Secondary | ICD-10-CM | POA: Diagnosis not present

## 2016-10-16 DIAGNOSIS — C679 Malignant neoplasm of bladder, unspecified: Secondary | ICD-10-CM | POA: Diagnosis not present

## 2016-10-16 DIAGNOSIS — D7589 Other specified diseases of blood and blood-forming organs: Secondary | ICD-10-CM | POA: Diagnosis not present

## 2016-10-16 DIAGNOSIS — I350 Nonrheumatic aortic (valve) stenosis: Secondary | ICD-10-CM | POA: Diagnosis not present

## 2016-10-16 DIAGNOSIS — Z0000 Encounter for general adult medical examination without abnormal findings: Secondary | ICD-10-CM | POA: Diagnosis not present

## 2016-10-16 DIAGNOSIS — I8290 Acute embolism and thrombosis of unspecified vein: Secondary | ICD-10-CM | POA: Diagnosis not present

## 2016-10-16 DIAGNOSIS — R69 Illness, unspecified: Secondary | ICD-10-CM | POA: Diagnosis not present

## 2016-10-16 DIAGNOSIS — M25561 Pain in right knee: Secondary | ICD-10-CM | POA: Diagnosis not present

## 2016-10-16 DIAGNOSIS — N184 Chronic kidney disease, stage 4 (severe): Secondary | ICD-10-CM | POA: Diagnosis not present

## 2016-10-18 DIAGNOSIS — Z936 Other artificial openings of urinary tract status: Secondary | ICD-10-CM | POA: Diagnosis not present

## 2016-10-18 DIAGNOSIS — Z1212 Encounter for screening for malignant neoplasm of rectum: Secondary | ICD-10-CM | POA: Diagnosis not present

## 2016-10-18 DIAGNOSIS — C679 Malignant neoplasm of bladder, unspecified: Secondary | ICD-10-CM | POA: Diagnosis not present

## 2016-10-24 ENCOUNTER — Encounter (HOSPITAL_COMMUNITY): Payer: Self-pay | Admitting: Interventional Radiology

## 2016-10-24 ENCOUNTER — Ambulatory Visit (HOSPITAL_COMMUNITY)
Admission: RE | Admit: 2016-10-24 | Discharge: 2016-10-24 | Disposition: A | Discharge status: Home / Self Care | Payer: Medicare HMO | Source: Ambulatory Visit | Attending: Interventional Radiology | Admitting: Interventional Radiology

## 2016-10-24 ENCOUNTER — Other Ambulatory Visit (HOSPITAL_COMMUNITY): Payer: Self-pay | Admitting: Interventional Radiology

## 2016-10-24 DIAGNOSIS — N135 Crossing vessel and stricture of ureter without hydronephrosis: Secondary | ICD-10-CM

## 2016-10-24 DIAGNOSIS — Y832 Surgical operation with anastomosis, bypass or graft as the cause of abnormal reaction of the patient, or of later complication, without mention of misadventure at the time of the procedure: Secondary | ICD-10-CM | POA: Insufficient documentation

## 2016-10-24 DIAGNOSIS — Z466 Encounter for fitting and adjustment of urinary device: Secondary | ICD-10-CM | POA: Insufficient documentation

## 2016-10-24 DIAGNOSIS — T8385XA Stenosis of genitourinary prosthetic devices, implants and grafts, initial encounter: Secondary | ICD-10-CM | POA: Insufficient documentation

## 2016-10-24 DIAGNOSIS — T83091A Other mechanical complication of indwelling urethral catheter, initial encounter: Secondary | ICD-10-CM | POA: Diagnosis not present

## 2016-10-24 HISTORY — PX: IR NEPHROSTOMY TUBE CHANGE: IMG1442

## 2016-10-24 MED ORDER — IOPAMIDOL (ISOVUE-300) INJECTION 61%
100.0000 mL | Freq: Once | INTRAVENOUS | Status: AC | PRN
  Administered 2016-10-24: 25 mL via INTRAVENOUS

## 2016-10-24 NOTE — Procedures (Signed)
Interventional Radiology Procedure Note  Procedure: Exchange of left retrograde nephro-ureteral drain via urostomy.  New 59F drain placed (45cm), down-sized from 69F drain.   Balloon angioplasty of anastamotic stricture at the ureteral in-flow to the conduit with 45mm and 19mm diameter to address significant resistance to drain advanced.     Findings: tight stricture at the ureteral anastamosis, precluding advancement of the 69F drain off the stiffener.  59F Drain was elected.   Complications: None  Recommendations:  - to drainage - pink urine expected.  Should resolve in a few days. - Schedule tube change in about 4 weeks.  May consider 59F or 69F biliary drain to add side-holes to the configuration (40cm length).  - Do not submerge - Routine care   Signed,  Dulcy Fanny. Earleen Newport, DO

## 2016-11-04 ENCOUNTER — Encounter (INDEPENDENT_AMBULATORY_CARE_PROVIDER_SITE_OTHER): Payer: Medicare HMO | Admitting: Ophthalmology

## 2016-11-04 DIAGNOSIS — H353114 Nonexudative age-related macular degeneration, right eye, advanced atrophic with subfoveal involvement: Secondary | ICD-10-CM

## 2016-11-04 DIAGNOSIS — H35033 Hypertensive retinopathy, bilateral: Secondary | ICD-10-CM | POA: Diagnosis not present

## 2016-11-04 DIAGNOSIS — H43813 Vitreous degeneration, bilateral: Secondary | ICD-10-CM | POA: Diagnosis not present

## 2016-11-04 DIAGNOSIS — H353221 Exudative age-related macular degeneration, left eye, with active choroidal neovascularization: Secondary | ICD-10-CM

## 2016-11-04 DIAGNOSIS — I10 Essential (primary) hypertension: Secondary | ICD-10-CM

## 2016-11-06 IMAGING — XA IR BILIARY CATHETER EXCHANGE
1 series · 1 of 1 positions shown · non-contrast
Comparison: none

CLINICAL DATA: Chronic distal ureteral anastomotic stricture,
chronic indwelling 12 French right nephro ureteral catheter

[Series 300: ir nephrostomy tube change · 1 of 1 slices shown]
[im 1/1]
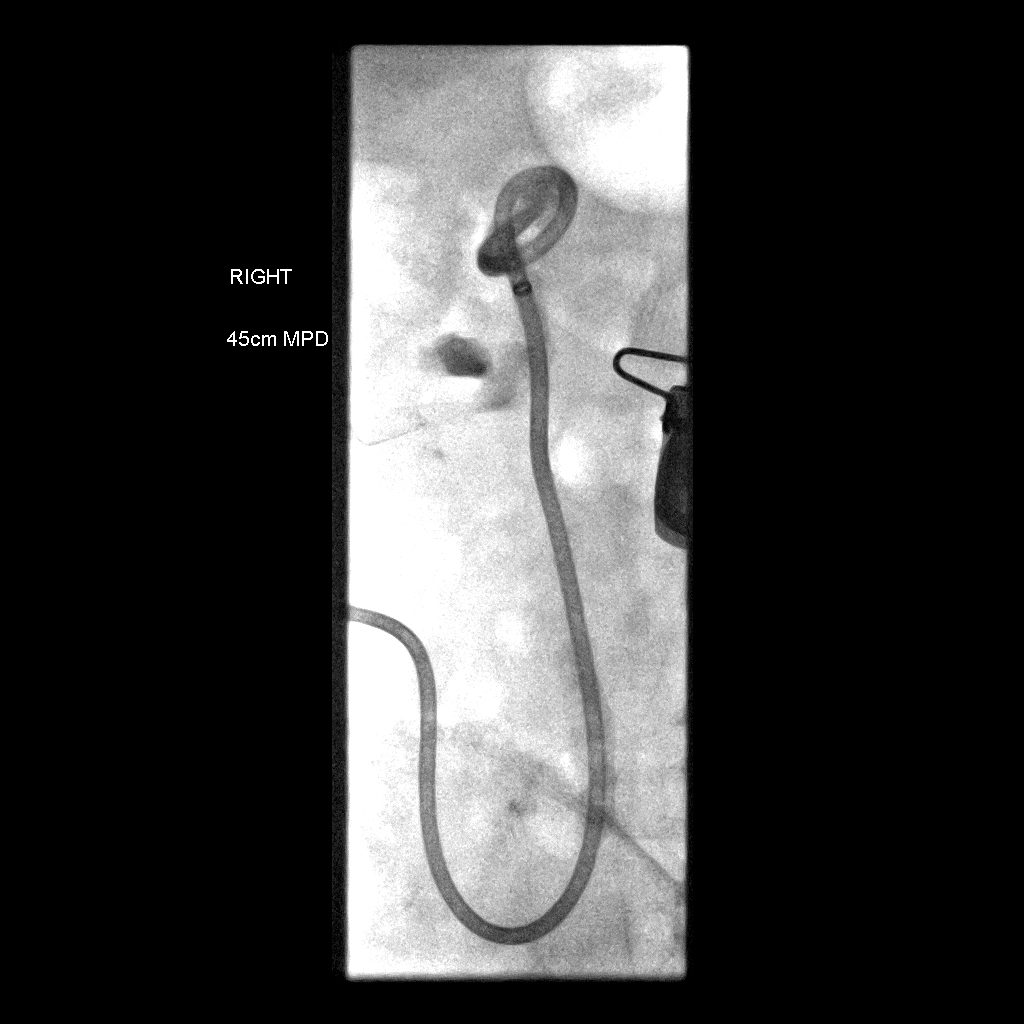

[1 of 1 positions shown; findings below may reference images not displayed]

EXAM:
FLUOROSCOPIC EXCHANGE OF THE RIGHT NEPHRO URETERAL CATHETER THROUGH
THE ILEOSTOMY

Date:  [DATE] [DATE]

Radiologist:  Nagornaja, Alfafort

Guidance:  Fluoroscopic

FLUOROSCOPY TIME:  9.8 minutes, 193

MEDICATIONS AND MEDICAL HISTORY:
None.

ANESTHESIA/SEDATION:
None.

CONTRAST:  10mL OMNIPAQUE IOHEXOL 300 MG/ML  SOLN

COMPLICATIONS:
None immediate

PROCEDURE:
Informed consent was obtained from the patient following explanation
of the procedure, risks, benefits and alternatives. The patient
understands, agrees and consents for the procedure. All questions
were addressed. A time out was performed.

Maximal barrier sterile technique utilized including caps, mask,
sterile gowns, sterile gloves, large sterile drape, hand hygiene,
and Betadine.

Injection of the right 12 French nephro ureteral catheter
demonstrates catheter occlusion. Several attempts were made to
remove the catheter over hydrophilic and stiff guidewires. This was
unsuccessful. The catheter eventually had to be retracted into the
distal ureter and cut a second time. This allowed advancement of a
stiff Glidewire into the distal ureter to save the ureteral access.
Catheter was advanced over the Glidewire to advance the access into
the collecting system. Guidewire exchanged for an Amplatz guidewire.
Over the Amplatz guidewire, a new 12 French 45 cm nephro ureteral
catheter was advanced with the retention loop formed in the upper
pole dilated collecting system. Position confirmed with fluoroscopy
and contrast injection. Overall he tolerated the procedure well.
External portion of the catheter remains in the ostomy bag.
IMPRESSION: Difficult but successful exchange of the right nephro ureteral 12
French catheter through the existing ostomy.

PLAN:
Continue routine exchanges every 3 weeks rather than 4 because of
the difficulty encounter today because of tube occlusion.

## 2016-11-18 ENCOUNTER — Encounter (HOSPITAL_COMMUNITY): Payer: Self-pay | Admitting: Interventional Radiology

## 2016-11-18 ENCOUNTER — Other Ambulatory Visit (HOSPITAL_COMMUNITY): Payer: Self-pay | Admitting: Interventional Radiology

## 2016-11-18 ENCOUNTER — Ambulatory Visit (HOSPITAL_COMMUNITY)
Admission: RE | Admit: 2016-11-18 | Discharge: 2016-11-18 | Disposition: A | Discharge status: Home / Self Care | Payer: Medicare HMO | Source: Ambulatory Visit | Attending: Interventional Radiology | Admitting: Interventional Radiology

## 2016-11-18 DIAGNOSIS — Z466 Encounter for fitting and adjustment of urinary device: Secondary | ICD-10-CM | POA: Insufficient documentation

## 2016-11-18 DIAGNOSIS — N135 Crossing vessel and stricture of ureter without hydronephrosis: Secondary | ICD-10-CM | POA: Insufficient documentation

## 2016-11-18 DIAGNOSIS — Z436 Encounter for attention to other artificial openings of urinary tract: Secondary | ICD-10-CM | POA: Diagnosis not present

## 2016-11-18 HISTORY — PX: IR NEPHROSTOMY TUBE CHANGE: IMG1442

## 2016-11-18 MED ORDER — IOPAMIDOL (ISOVUE-300) INJECTION 61%
INTRAVENOUS | Status: AC
  Administered 2016-11-18: 10 mL via INTRAVENOUS
  Filled 2016-11-18: qty 50

## 2016-11-18 MED ORDER — IOPAMIDOL (ISOVUE-300) INJECTION 61%
50.0000 mL | Freq: Once | INTRAVENOUS | Status: AC | PRN
  Administered 2016-11-18: 10 mL via INTRAVENOUS

## 2016-11-18 NOTE — Procedures (Signed)
Pre Procedure Dx: Ureteral Stricture Post Procedure Dx: Same  Successful right sided retrograde NUS exchange.    EBL: None   No immediate complications.   Ronny Bacon, MD Pager #: [phone]

## 2016-11-27 IMAGING — XA IR BILIARY CATHETER EXCHANGE
1 series · 2 of 2 positions shown · non-contrast
Comparison: none

CLINICAL DATA: Routine right nephro ureteral catheter exchange.

[Series 300: tube placements · 2 of 2 slices shown]
[im 1/2]
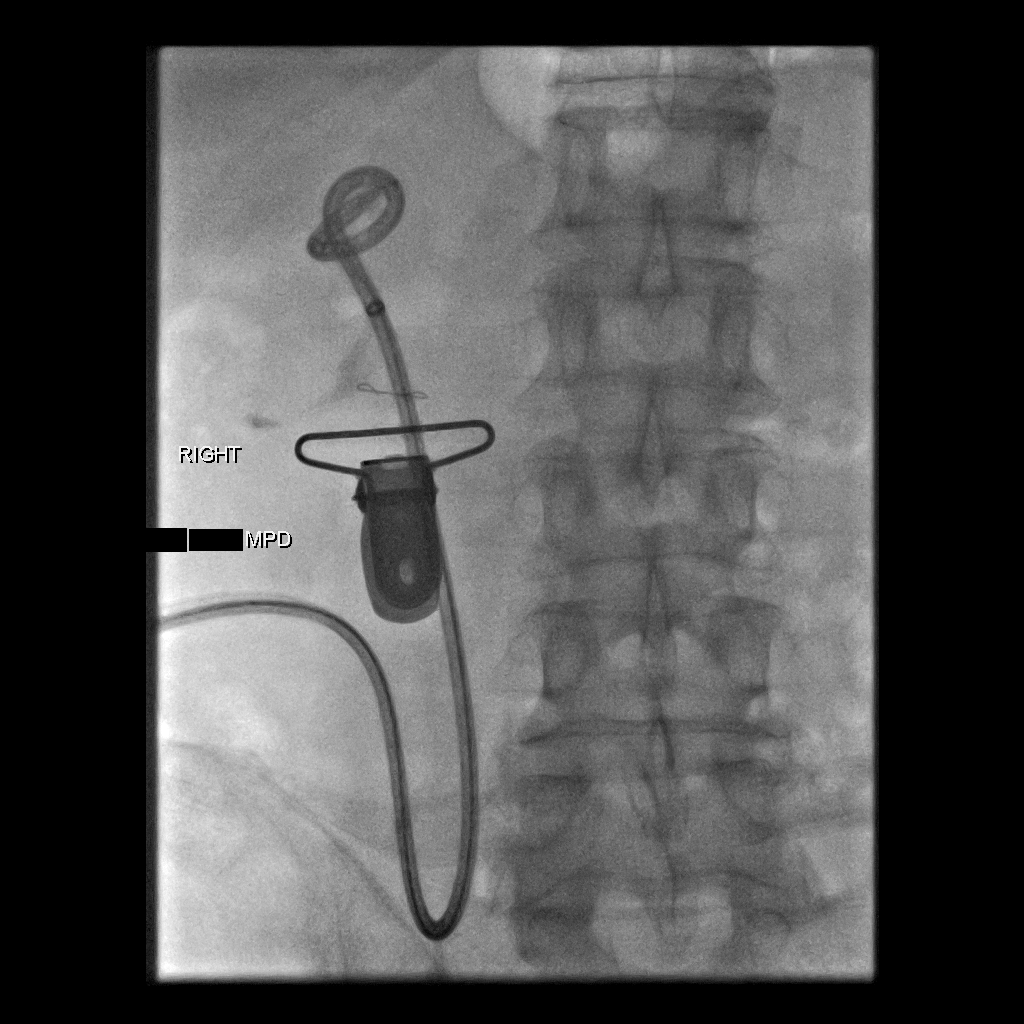
[im 2/2]
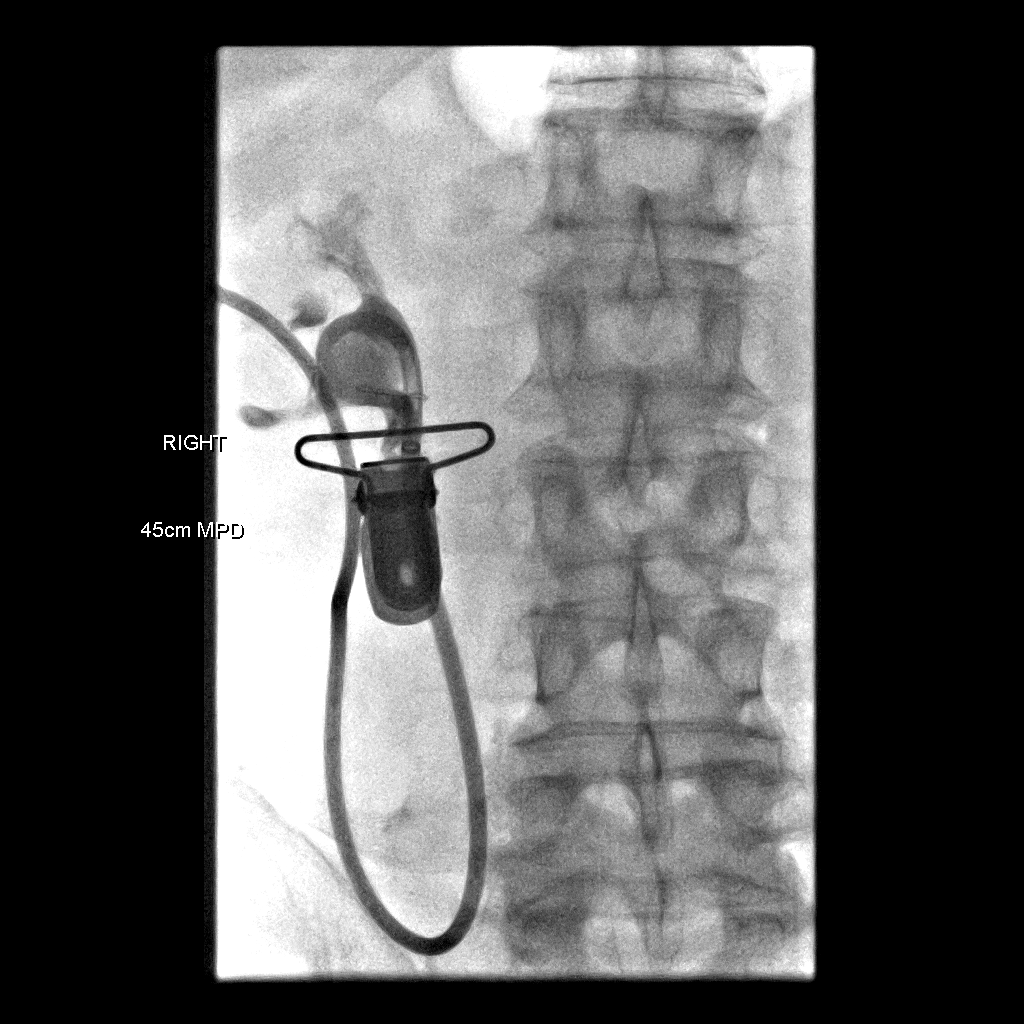

[2 of 2 positions shown; findings below may reference images not displayed]

EXAM:
PERC TUBE CHG W/CM

FLUOROSCOPY TIME:  2 minutes and 48 seconds

MEDICATIONS AND MEDICAL HISTORY:
None

ANESTHESIA/SEDATION:
None

CONTRAST:  10 cc Omnipaque 300

PROCEDURE:
The procedure, risks, benefits, and alternatives were explained to
the patient. Questions regarding the procedure were encouraged and
answered. The patient understands and consents to the procedure.

The right lower quadrant ileal conduit site was prepped with
ChloraPrep in a sterile fashion, and a sterile drape was applied
covering the operative field. A sterile gown and sterile gloves were
used for the procedure.

Under fluoroscopic guidance, the existing catheter was exchanged
over an Amplatz wire for a new 12 French 45 cm nephro ureteral
catheter. It was looped and string fixed in the renal pelvis.
Contrast was injected.
FINDINGS: Images document exchange of the right lower quadrant right-sided
nephro ureteral catheter. The tip is coiled in the renal pelvis.

COMPLICATIONS:
None
IMPRESSION: Successful right nephro ureteral catheter exchange.

## 2016-11-28 ENCOUNTER — Other Ambulatory Visit (HOSPITAL_COMMUNITY): Payer: Medicare HMO

## 2016-12-02 ENCOUNTER — Other Ambulatory Visit (HOSPITAL_COMMUNITY): Payer: Medicare HMO

## 2016-12-10 ENCOUNTER — Encounter: Payer: Self-pay | Admitting: Internal Medicine

## 2016-12-10 ENCOUNTER — Ambulatory Visit (INDEPENDENT_AMBULATORY_CARE_PROVIDER_SITE_OTHER): Payer: Medicare HMO | Admitting: Internal Medicine

## 2016-12-10 VITALS — BP 148/78 | HR 82 | Ht 67.5 in | Wt 159.4 lb

## 2016-12-10 DIAGNOSIS — R001 Bradycardia, unspecified: Secondary | ICD-10-CM | POA: Diagnosis not present

## 2016-12-10 DIAGNOSIS — I10 Essential (primary) hypertension: Secondary | ICD-10-CM | POA: Diagnosis not present

## 2016-12-10 LAB — CUP PACEART INCLINIC DEVICE CHECK
Brady Statistic AP VS Percent: 37 %
Brady Statistic AS VS Percent: 1 %
Date Time Interrogation Session: 20180918152727
Implantable Lead Implant Date: 20090917
Implantable Lead Location: 753859
Implantable Lead Model: 5076
Lead Channel Impedance Value: 488 Ohm
Lead Channel Pacing Threshold Amplitude: 0.5 V
Lead Channel Pacing Threshold Amplitude: 0.5 V
Lead Channel Pacing Threshold Amplitude: 0.5 V
Lead Channel Pacing Threshold Pulse Width: 0.4 ms
Lead Channel Pacing Threshold Pulse Width: 0.4 ms
Lead Channel Pacing Threshold Pulse Width: 0.4 ms
Lead Channel Sensing Intrinsic Amplitude: 4 mV
Lead Channel Setting Sensing Sensitivity: 2 mV
MDC IDC LEAD IMPLANT DT: 20090917
MDC IDC LEAD LOCATION: 753860
MDC IDC MSMT BATTERY IMPEDANCE: 1654 Ohm
MDC IDC MSMT BATTERY REMAINING LONGEVITY: 36 mo
MDC IDC MSMT BATTERY VOLTAGE: 2.76 V
MDC IDC MSMT LEADCHNL RA IMPEDANCE VALUE: 441 Ohm
MDC IDC MSMT LEADCHNL RA PACING THRESHOLD AMPLITUDE: 0.5 V
MDC IDC MSMT LEADCHNL RA PACING THRESHOLD PULSEWIDTH: 0.4 ms
MDC IDC PG IMPLANT DT: 20090917
MDC IDC SET LEADCHNL RA PACING AMPLITUDE: 2 V
MDC IDC SET LEADCHNL RV PACING AMPLITUDE: 2.5 V
MDC IDC SET LEADCHNL RV PACING PULSEWIDTH: 0.4 ms
MDC IDC STAT BRADY AP VP PERCENT: 61 %
MDC IDC STAT BRADY AS VP PERCENT: 1 %

## 2016-12-10 NOTE — Progress Notes (Signed)
HPI [name] returns today for ongoing evaluation and management of symptomatic bradycardia secondary to sinus node dysfunction, status post pacemaker insertion and hypertension. In the interim, he continues to do well. He denies chest pain, shortness of breath, or syncope. He has very minimal peripheral edema. He is fairly sedentary. No Known Allergies   Current Outpatient Prescriptions  Medication Sig Dispense Refill  . acetaminophen (TYLENOL) 500 MG tablet Take 1,000 mg by mouth every 8 (eight) hours as needed for mild pain or headache.    Marland Kitchen amLODipine (NORVASC) 2.5 MG tablet Take 2.5 mg by mouth daily.    Marland Kitchen aspirin EC 81 MG tablet Take 81 mg by mouth every morning.    . Cholecalciferol (VITAMIN D-3) 1000 UNITS CAPS Take 1 capsule by mouth every morning.    . ciprofloxacin (CIPRO) 250 MG tablet As directed    . docusate sodium (COLACE) 100 MG capsule Take 300 mg by mouth daily.     . Hyprom-Naphaz-Polysorb-Zn Sulf (CLEAR EYES COMPLETE OP) Apply 1-2 drops to eye daily as needed (dry eyes).    . Multiple Vitamins-Minerals (PRESERVISION AREDS 2 PO) Take 1 tablet by mouth 2 (two) times daily.    . OMEGA-3 KRILL OIL PO Take 1 tablet by mouth every morning.    Marland Kitchen ULORIC 40 MG tablet Take 1 tablet by mouth daily.     No current facility-administered medications for this visit.      Past Medical History:  Diagnosis Date  . Aortic valve stenosis    a. mild to moderate by echo 2013  . Bradycardia    MDT dual chamber pacemaker  . Chronic kidney disease   . Deep venous thrombosis (HCC)    hx of  LEFT ARM AFTER PACEMAKER INSERTION ABOUT 6 YRS AGO  . Depression   . Gout   . Hypertension   . Kidney stone   . Macular degeneration    PT STATES HIS VISION STILL OKAY FOR DRIVING  . Osteoporosis   . Prostate cancer ([place])    AND BLADDER CANCER - S/P URETEROILEAL CONDUIT  . S/P ileal conduit (HCC)         ROS:   All systems reviewed and negative except as noted in the  HPI.   Past Surgical History:  Procedure Laterality Date  . 07/20/13  CONVERSION OF RIGHT SIDED NEPHROSTOMY CATHETER TO RIGHT SIDED NEPHRO URETERAL CATHETER - DONE IN INTERVENTIONAL RADIOLOGY    . APPENDECTOMY    . CATARACT EXTRACTION, BILATERAL    . CYSTECTOMY    . ilial conduit     for bladder cancer  . IR GENERIC HISTORICAL  10/26/2015   IR NEPHROSTOMY TUBE CHANGE 10/26/2015 Jacqulynn Cadet, MD [facility]  . IR GENERIC HISTORICAL  12/07/2015   IR NEPHROSTOMY TUBE CHANGE 12/07/2015 Greggory Keen, MD [facility]  . IR GENERIC HISTORICAL  01/11/2016   IR NEPHROSTOMY TUBE CHANGE 01/11/2016 Greggory Keen, MD [facility]  . IR GENERIC HISTORICAL  02/08/2016   IR NEPHROSTOMY TUBE CHANGE 02/08/2016 [facility]  . IR GENERIC HISTORICAL  03/14/2016   IR NEPHROSTOMY TUBE CHANGE 03/14/2016 Jacqulynn Cadet, MD [facility]  . IR GENERIC HISTORICAL  04/22/2016   IR NEPHROSTOMY TUBE CHANGE 04/22/2016 Aletta Edouard, MD [facility]  . IR GENERIC HISTORICAL  05/27/2016   IR NEPHROSTOMY TUBE CHANGE 05/27/2016 Arne Cleveland, MD [facility]  . IR NEPHROSTOMY TUBE CHANGE  07/01/2016  . IR NEPHROSTOMY TUBE CHANGE  08/05/2016  . IR NEPHROSTOMY TUBE  CHANGE  09/18/2016  . IR NEPHROSTOMY TUBE CHANGE  10/24/2016  . IR NEPHROSTOMY TUBE CHANGE  11/18/2016  . KIDNEY STONE SURGERY    . LYMPHADENECTOMY    . NEPHROLITHOTOMY  09/02/2011   Procedure: NEPHROLITHOTOMY PERCUTANEOUS;  Surgeon: Malka So, MD;  Location: [facility];  Service: Urology;  Laterality: Left;  . NEPHROLITHOTOMY Right 07/27/2013   Procedure: RIGHT PERCUTANEOUS NEPHROLITHOTOMY ;  Surgeon: Irine Seal, MD;  Location: [facility];  Service: Urology;  Laterality: Right;  . PACEMAKER INSERTION  2009   MDT dual chamber pacemaker implanted by Dr Verlon Setting  . PROSTATE SURGERY       Family History  Problem Relation Age of Onset  . Prostate cancer Father 73       died  . Pancreatic cancer Mother 22       died     Social History   Social History  .  Marital status: Widowed    Spouse name: N/A  . Number of children: 3  . Years of education: N/A   Occupational History  .  Retired   Social History Main Topics  . Smoking status: Former Smoker    Types: Cigarettes    Quit date: 03/25/1952  . Smokeless tobacco: Never Used  . Alcohol use No  . Drug use: No  . Sexual activity: No   Other Topics Concern  . Not on file   Social History Narrative  . No narrative on file     BP (!) 148/78   Pulse 82   Ht 5' 7.5" (1.715 m)   Wt 159 lb 6.4 oz (72.3 kg)   SpO2 97%   BMI 24.60 kg/m   Physical Exam:  Well appearing elderly man, NAD HEENT: Unremarkable Neck:  7 cm JVD, no thyromegally Lymphatics:  No adenopathy Back:  No CVA tenderness Lungs:  Clear, with no wheezes, rales, or rhonchi. HEART:  Regular rate rhythm, grade 2/6 systolic murmurs, no rubs, no clicks, a 2 is markedly reduced Abd:  soft, positive bowel sounds, no organomegally, no rebound, no guarding Ext:  2 plus pulses, no edema, no cyanosis, no clubbing Skin:  No rashes no nodules Neuro:  CN II through XII intact, motor grossly intact   DEVICE  Normal device function.  See PaceArt for details.   Assess/Plan: 1. Sinus node dysfunction - he is asymptomatic status post pacemaker insertion. 2. Hypertension- his systolic blood pressure is a little high today. He admits to some dietary indiscretion and I have asked the patient to reduce his salt intake. He will continue his medical therapy. 3. Dual-chamber pacemaker - his Medtronic dual-chamber pacemaker is working normally. We'll plan to recheck in several months. 4. Aortic stenosis - today I consider having the patient undergo a 2-D echo. Because he is completely asymptomatic despite likely severe aortic stenosis, I recommended a period of watchful waiting. With regard to his symptoms, he has none. I've warned him about chest pain, shortness of breath, or syncope and how they pertained aortic stenosis.  Cristopher Peru,  M.D.

## 2016-12-10 NOTE — Patient Instructions (Signed)
Medication Instructions:  Your physician recommends that you continue on your current medications as directed. Please refer to the Current Medication list given to you today.  Labwork: None ordered.  Testing/Procedures: None ordered.  Follow-Up: Device clinic will have you follow-up in: 6 months.  You will receive a reminder letter in the mail two months in advance. If you don't receive a letter, please call our office to schedule the follow-up appointment.  Your physician wants you to follow-up in: one year with Dr. Lovena Le.   You will receive a reminder letter in the mail two months in advance. If you don't receive a letter, please call our office to schedule the follow-up appointment.   Any Other Special Instructions Will Be Listed Below (If Applicable).     If you need a refill on your cardiac medications before your next appointment, please call your pharmacy.

## 2016-12-16 ENCOUNTER — Other Ambulatory Visit (HOSPITAL_COMMUNITY): Payer: Medicare HMO

## 2016-12-16 DIAGNOSIS — R69 Illness, unspecified: Secondary | ICD-10-CM | POA: Diagnosis not present

## 2016-12-16 DIAGNOSIS — R946 Abnormal results of thyroid function studies: Secondary | ICD-10-CM | POA: Diagnosis not present

## 2016-12-23 DIAGNOSIS — N135 Crossing vessel and stricture of ureter without hydronephrosis: Secondary | ICD-10-CM | POA: Diagnosis not present

## 2016-12-23 DIAGNOSIS — Z8551 Personal history of malignant neoplasm of bladder: Secondary | ICD-10-CM | POA: Diagnosis not present

## 2016-12-23 DIAGNOSIS — N200 Calculus of kidney: Secondary | ICD-10-CM | POA: Diagnosis not present

## 2016-12-25 IMAGING — XA IR EXCHANGE NEPHROSTOMY RIGHT
1 series · 5 of 5 positions shown · non-contrast
Comparison: none

CLINICAL DATA: Routine exchange of right-sided nephroureteral
catheter requires through ileostomy access.

[Series 300: ir nephrostomy tube change · 5 of 5 slices shown]
[im 1/5]
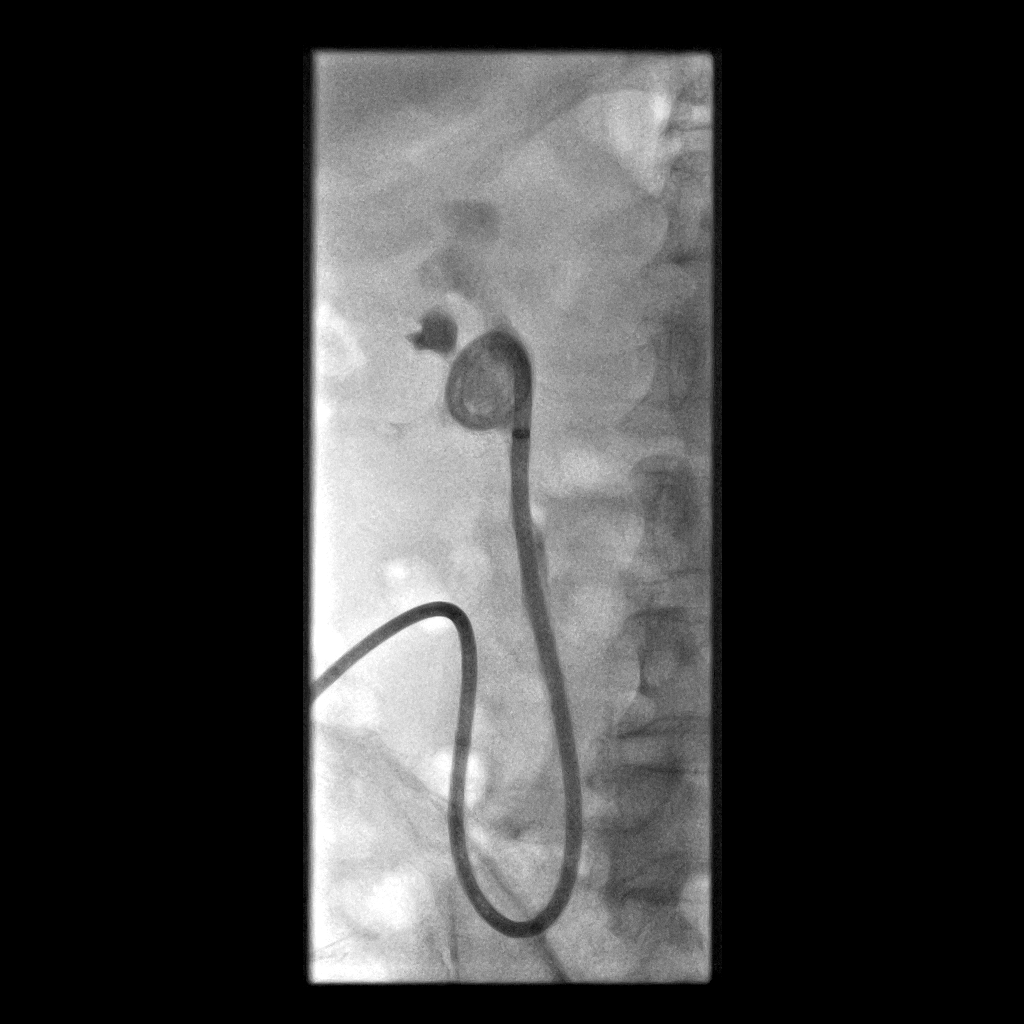
[im 2/5]
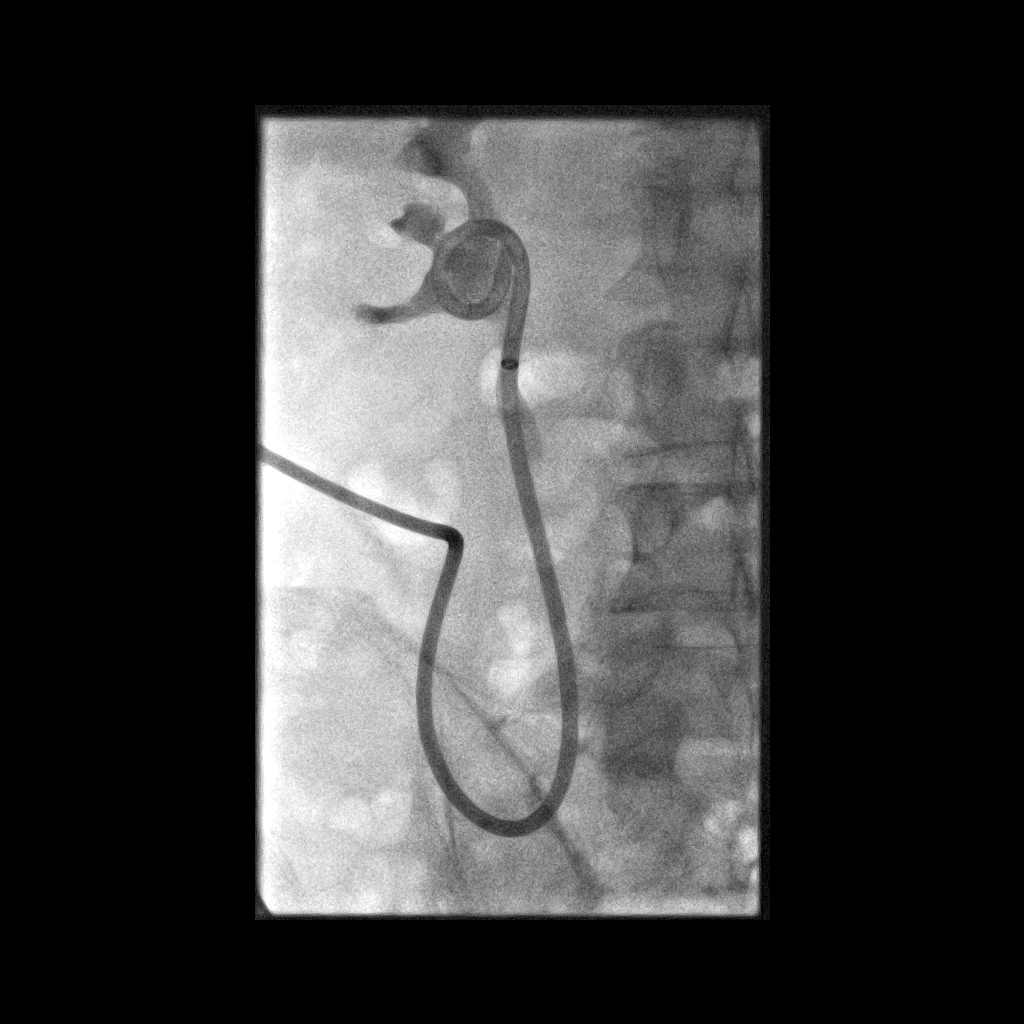
[im 3/5]
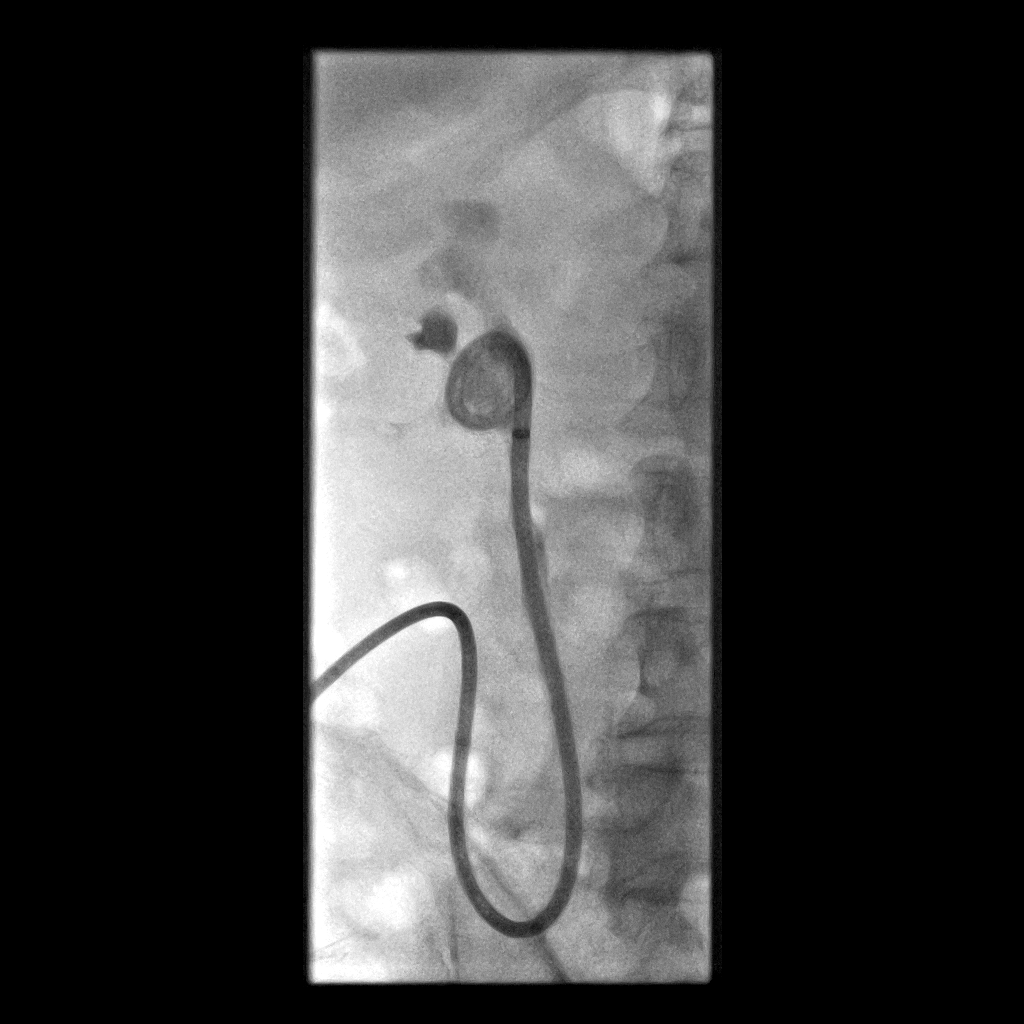
[im 4/5]
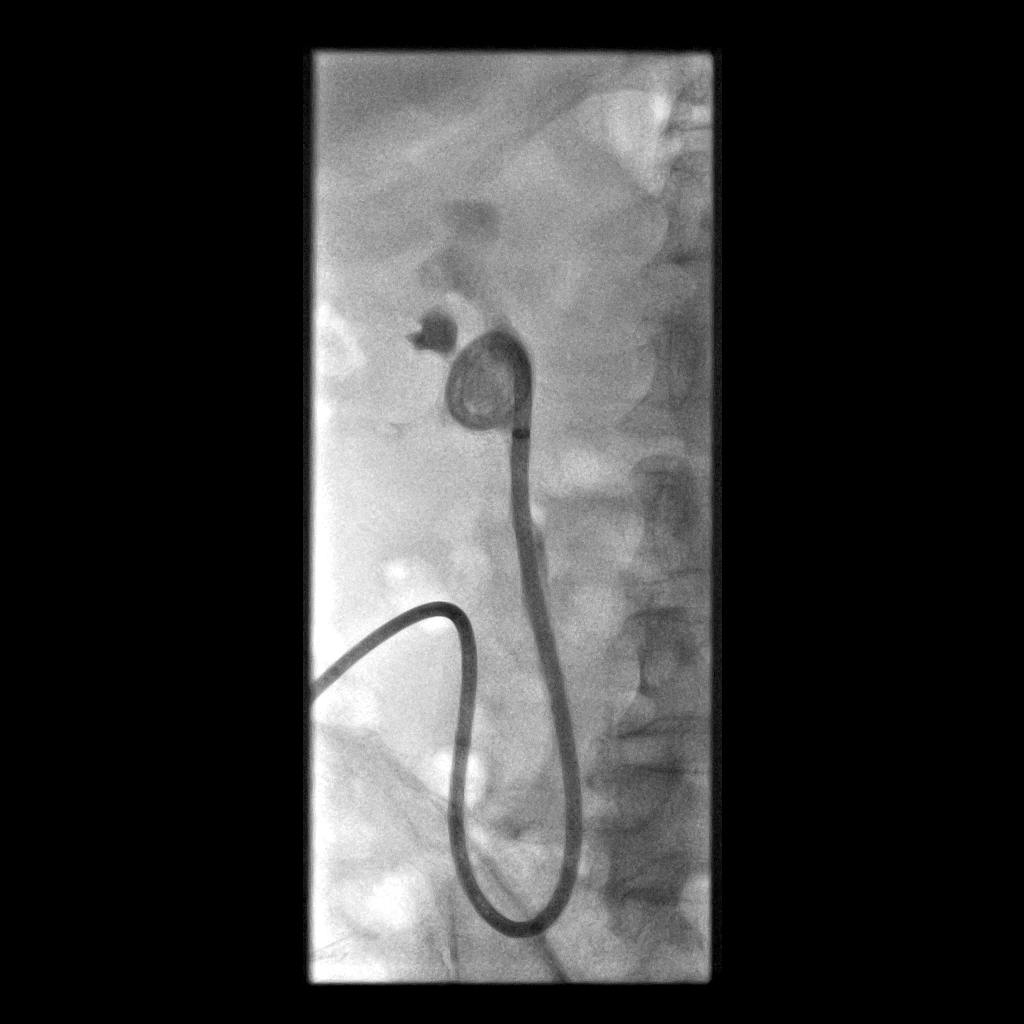
[im 5/5]
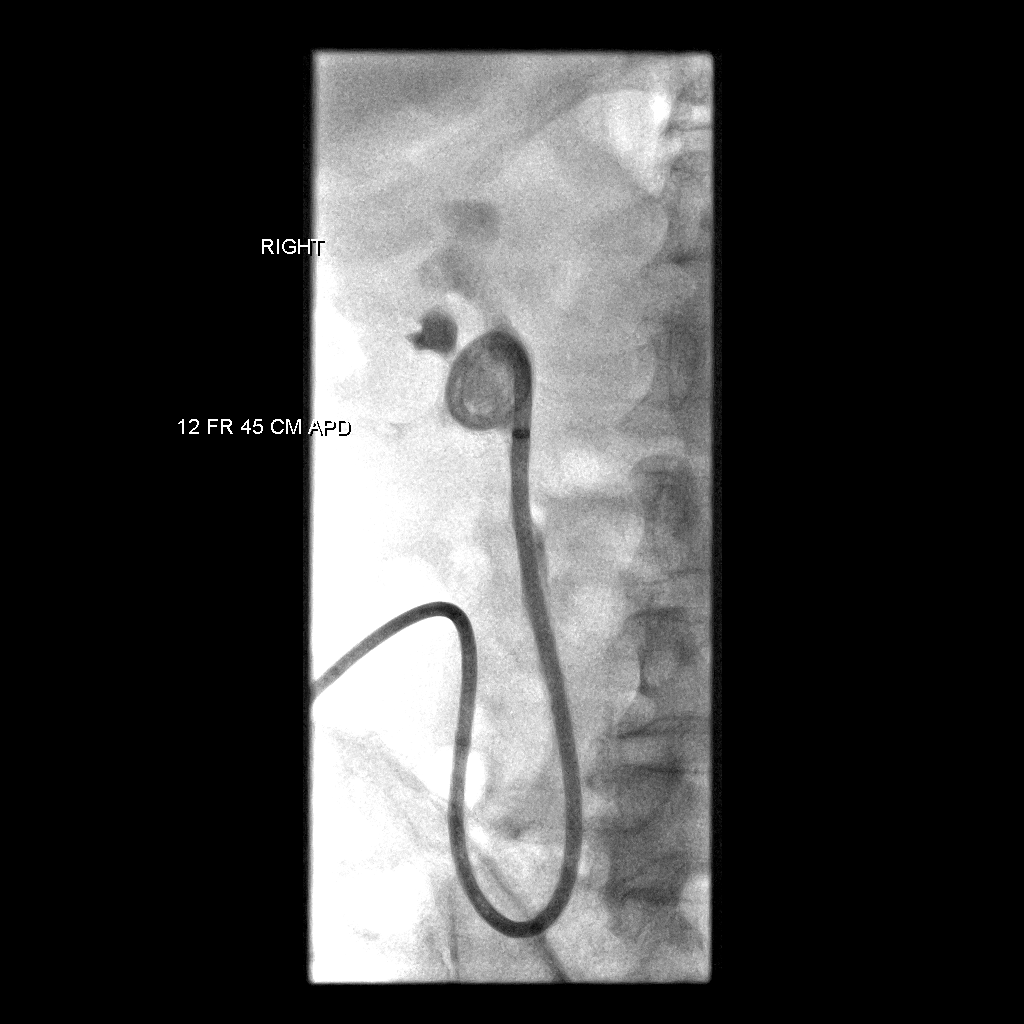

[5 of 5 positions shown; findings below may reference images not displayed]

EXAM:
NEPHROURETERAL CATHETER EXCHANGE

CONTRAST:  10 ml [id number]

FLUOROSCOPY TIME:  5 minutes and 18 seconds.

PROCEDURE:
The procedure, risks, benefits, and alternatives were explained to
the patient. Questions regarding the procedure were encouraged and
answered. The patient understands and consents to the procedure.

The pre-existing nephroureteral catheter and ileostomy site was
prepped with Betadine in a sterile fashion, and a sterile drape was
applied covering the operative field. sterile gown and sterile
gloves were used for the procedure.

The preexisting catheter was injected with contrast material under
fluoroscopy. It was then removed over a guidewire. A new 12 French
nephroureteral catheter was advanced over the guidewire and through
the ileostomy. The catheter was formed at the level of the renal
pelvis.

Final catheter position was confirmed with a fluoroscopic spot image
obtained after injection of contrast.

COMPLICATIONS:
None.
FINDINGS: Exchange of the catheter was uncomplicated with the tube new tube
formed at the level of the renal pelvis.
IMPRESSION: Exchange of chronic indwelling right-sided nephroureteral catheter
via ileostomy.

## 2016-12-30 ENCOUNTER — Ambulatory Visit (HOSPITAL_COMMUNITY)
Admission: RE | Admit: 2016-12-30 | Discharge: 2016-12-30 | Disposition: A | Discharge status: Home / Self Care | Payer: Medicare HMO | Source: Ambulatory Visit | Attending: Interventional Radiology | Admitting: Interventional Radiology

## 2016-12-30 ENCOUNTER — Other Ambulatory Visit (HOSPITAL_COMMUNITY): Payer: Self-pay | Admitting: Interventional Radiology

## 2016-12-30 DIAGNOSIS — N135 Crossing vessel and stricture of ureter without hydronephrosis: Secondary | ICD-10-CM

## 2016-12-30 DIAGNOSIS — N99528 Other complication of other external stoma of urinary tract: Secondary | ICD-10-CM | POA: Diagnosis not present

## 2016-12-30 DIAGNOSIS — Z466 Encounter for fitting and adjustment of urinary device: Secondary | ICD-10-CM | POA: Insufficient documentation

## 2016-12-30 DIAGNOSIS — C679 Malignant neoplasm of bladder, unspecified: Secondary | ICD-10-CM | POA: Diagnosis present

## 2016-12-30 HISTORY — PX: IR NEPHROSTOMY TUBE CHANGE: IMG1442

## 2016-12-30 MED ORDER — IOPAMIDOL (ISOVUE-300) INJECTION 61%
INTRAVENOUS | Status: AC
  Administered 2016-12-30: 10 mL
  Filled 2016-12-30: qty 50

## 2016-12-30 MED ORDER — IOPAMIDOL (ISOVUE-300) INJECTION 61%
10.0000 mL | Freq: Once | INTRAVENOUS | Status: AC | PRN
  Administered 2016-12-30: 10 mL

## 2016-12-31 ENCOUNTER — Encounter (HOSPITAL_COMMUNITY): Payer: Self-pay | Admitting: Interventional Radiology

## 2017-01-20 ENCOUNTER — Encounter (INDEPENDENT_AMBULATORY_CARE_PROVIDER_SITE_OTHER): Payer: Medicare HMO | Admitting: Ophthalmology

## 2017-01-20 DIAGNOSIS — I10 Essential (primary) hypertension: Secondary | ICD-10-CM

## 2017-01-20 DIAGNOSIS — H35033 Hypertensive retinopathy, bilateral: Secondary | ICD-10-CM | POA: Diagnosis not present

## 2017-01-20 DIAGNOSIS — H353221 Exudative age-related macular degeneration, left eye, with active choroidal neovascularization: Secondary | ICD-10-CM

## 2017-01-20 DIAGNOSIS — H43813 Vitreous degeneration, bilateral: Secondary | ICD-10-CM

## 2017-01-20 DIAGNOSIS — H353114 Nonexudative age-related macular degeneration, right eye, advanced atrophic with subfoveal involvement: Secondary | ICD-10-CM | POA: Diagnosis not present

## 2017-01-21 DIAGNOSIS — Z936 Other artificial openings of urinary tract status: Secondary | ICD-10-CM | POA: Diagnosis not present

## 2017-01-21 DIAGNOSIS — C679 Malignant neoplasm of bladder, unspecified: Secondary | ICD-10-CM | POA: Diagnosis not present

## 2017-02-03 ENCOUNTER — Other Ambulatory Visit (HOSPITAL_COMMUNITY): Payer: Self-pay | Admitting: Diagnostic Radiology

## 2017-02-03 ENCOUNTER — Encounter (HOSPITAL_COMMUNITY): Payer: Self-pay | Admitting: Diagnostic Radiology

## 2017-02-03 ENCOUNTER — Ambulatory Visit (HOSPITAL_COMMUNITY)
Admission: RE | Admit: 2017-02-03 | Discharge: 2017-02-03 | Disposition: A | Discharge status: Home / Self Care | Payer: Medicare HMO | Source: Ambulatory Visit | Attending: Interventional Radiology | Admitting: Interventional Radiology

## 2017-02-03 DIAGNOSIS — N135 Crossing vessel and stricture of ureter without hydronephrosis: Secondary | ICD-10-CM

## 2017-02-03 DIAGNOSIS — Z436 Encounter for attention to other artificial openings of urinary tract: Secondary | ICD-10-CM | POA: Insufficient documentation

## 2017-02-03 DIAGNOSIS — Z466 Encounter for fitting and adjustment of urinary device: Secondary | ICD-10-CM | POA: Diagnosis not present

## 2017-02-03 HISTORY — PX: IR NEPHROSTOMY TUBE CHANGE: IMG1442

## 2017-02-03 MED ORDER — LIDOCAINE HCL 1 % IJ SOLN
INTRAMUSCULAR | Status: AC
  Filled 2017-02-03: qty 20

## 2017-02-03 MED ORDER — IOPAMIDOL (ISOVUE-300) INJECTION 61%
INTRAVENOUS | Status: AC
  Administered 2017-02-03: 10 mL
  Filled 2017-02-03: qty 50

## 2017-02-03 MED ORDER — IOPAMIDOL (ISOVUE-300) INJECTION 61%
50.0000 mL | Freq: Once | INTRAVENOUS | Status: AC | PRN
  Administered 2017-02-03: 10 mL

## 2017-02-03 NOTE — Procedures (Signed)
Successful exchange of the right nephroureteral catheter.  Tip in right renal pelvis.  No immediate complication. No blood loss.

## 2017-02-05 IMAGING — XA IR BILIARY CATHETER EXCHANGE
1 series · 3 of 3 positions shown · non-contrast
Comparison: Most recent prior exchange 03/09/2015

INDICATION: [AGE] male with chronic indwelling retrograde right nephro
ureteral stent via right lower quadrant ileostomy. He presents for
routine exchange.

EXAM:
PERC TUBE CHG W/CM

[Series 300: tube placements · 3 of 3 slices shown]
[im 1/3]
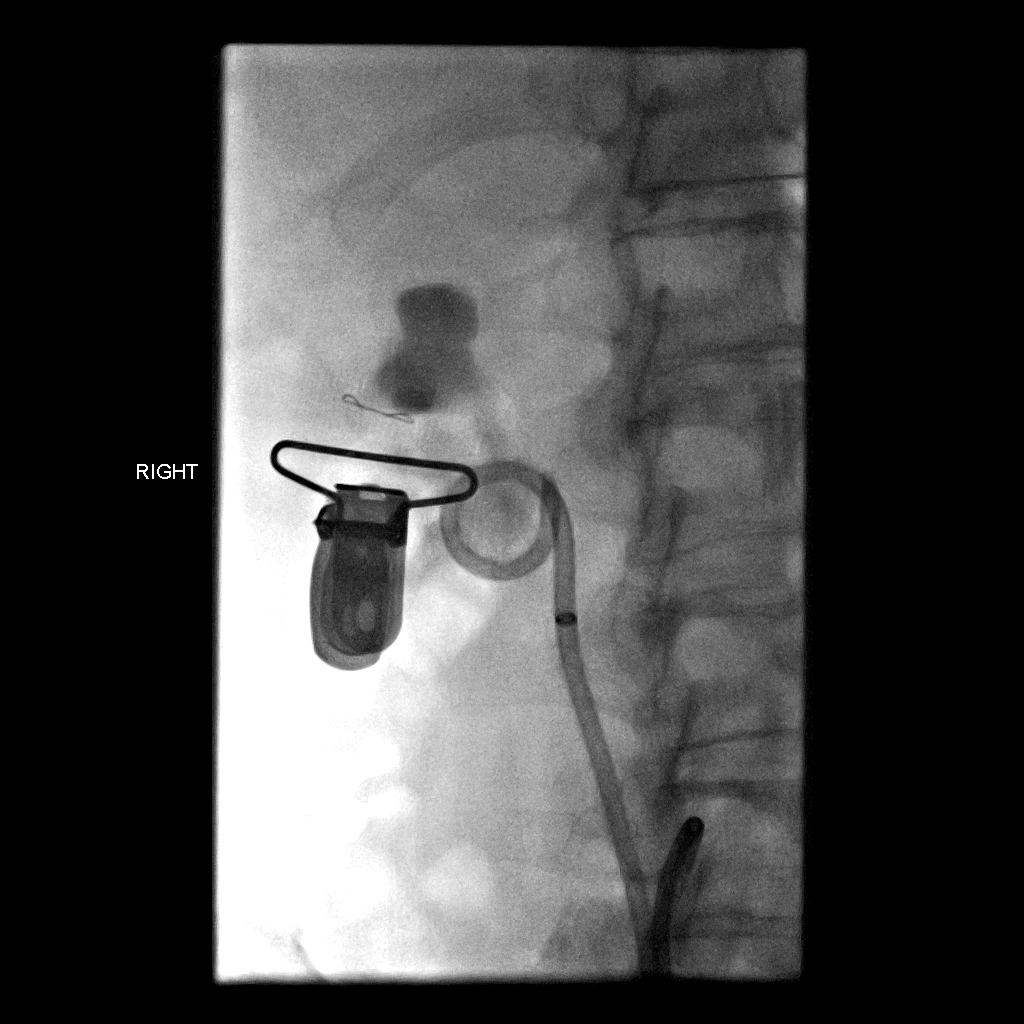
[im 2/3]
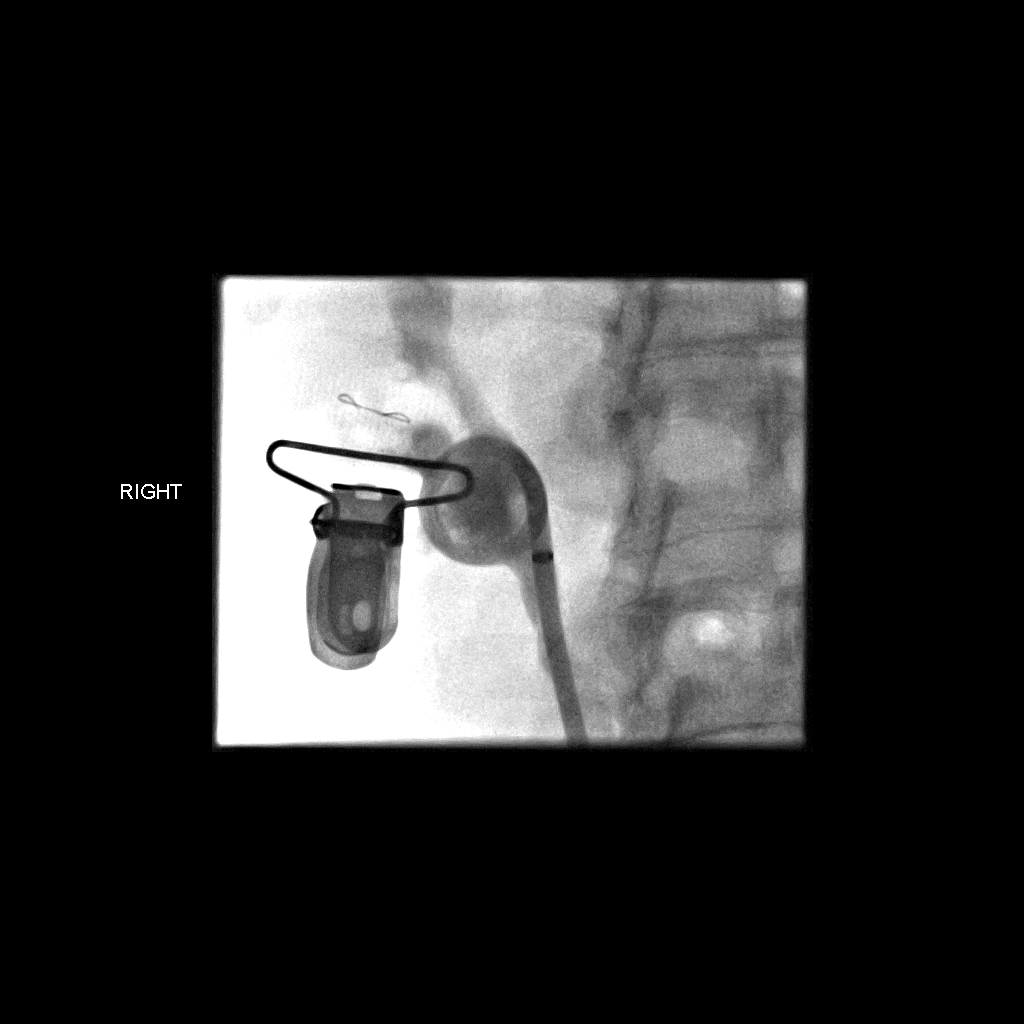
[im 3/3]
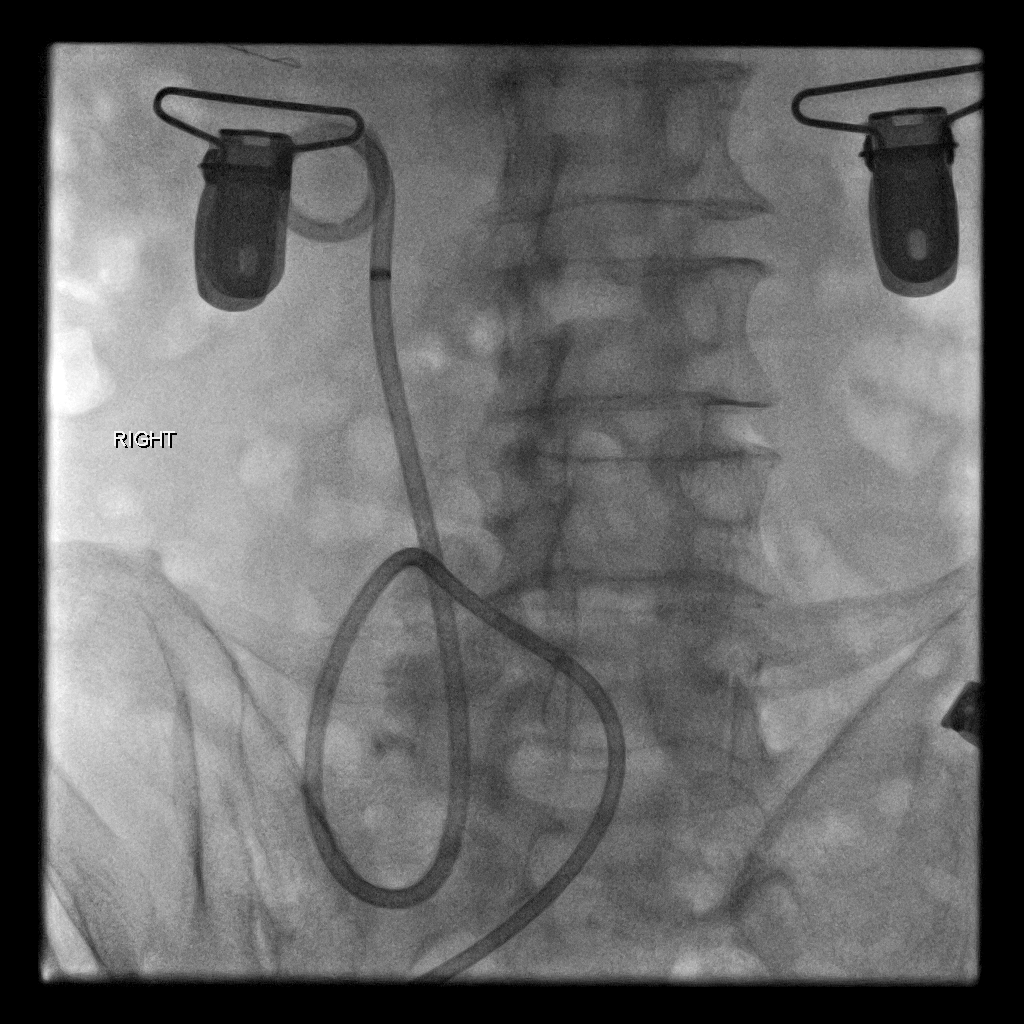

[3 of 3 positions shown; findings below may reference images not displayed]

MEDICATIONS:
None

ANESTHESIA/SEDATION:
None

CONTRAST:  10mL OMNIPAQUE IOHEXOL 300 MG/ML SOLN - administered into
the collecting system(s)

FLUOROSCOPY TIME:  Fluoroscopy Time: 1 minutes 54 seconds (53.6
mGy).

COMPLICATIONS:
None immediate.

PROCEDURE:
Informed written consent was obtained from the patient after a
thorough discussion of the procedural risks, benefits and
alternatives. All questions were addressed. Maximal Sterile Barrier
Technique was utilized including caps, mask, sterile gowns, sterile
gloves, sterile drape, hand hygiene and skin antiseptic. A timeout
was performed prior to the initiation of the procedure.

The existing nephrostomy tube was injected with contrast material.
The tube is well positioned in the renal pelvis. The tube was cut
and removed over an Amplatz wire. [REDACTED] French 45 cm nephro
ureteral tube was advanced over the Amplatz wire and formed with the
locking loop in the renal pelvis. This was confirmed under
fluoroscopy with a gentle hand injection of contrast.
IMPRESSION: Successful exchange of routine indwelling 12 French 45 cm retrograde
nephro ureteral tube via the right lower quadrant ostomy.

## 2017-03-13 ENCOUNTER — Encounter (HOSPITAL_COMMUNITY): Payer: Self-pay | Admitting: Interventional Radiology

## 2017-03-13 ENCOUNTER — Other Ambulatory Visit (HOSPITAL_COMMUNITY): Payer: Self-pay | Admitting: Diagnostic Radiology

## 2017-03-13 ENCOUNTER — Ambulatory Visit (HOSPITAL_COMMUNITY)
Admission: RE | Admit: 2017-03-13 | Discharge: 2017-03-13 | Disposition: A | Discharge status: Home / Self Care | Payer: Medicare HMO | Source: Ambulatory Visit | Attending: Diagnostic Radiology | Admitting: Diagnostic Radiology

## 2017-03-13 ENCOUNTER — Other Ambulatory Visit (HOSPITAL_COMMUNITY): Payer: Self-pay | Admitting: Interventional Radiology

## 2017-03-13 DIAGNOSIS — N135 Crossing vessel and stricture of ureter without hydronephrosis: Secondary | ICD-10-CM

## 2017-03-13 DIAGNOSIS — Z436 Encounter for attention to other artificial openings of urinary tract: Secondary | ICD-10-CM | POA: Diagnosis present

## 2017-03-13 HISTORY — PX: IR NEPHROSTOMY TUBE CHANGE: IMG1442

## 2017-03-13 MED ORDER — IOPAMIDOL (ISOVUE-300) INJECTION 61%
INTRAVENOUS | Status: AC
  Administered 2017-03-13: 10 mL
  Filled 2017-03-13: qty 50

## 2017-03-13 MED ORDER — IOPAMIDOL (ISOVUE-300) INJECTION 61%
10.0000 mL | Freq: Once | INTRAVENOUS | Status: AC | PRN
  Administered 2017-03-13: 10 mL

## 2017-03-13 NOTE — Procedures (Signed)
Pre Procedure Dx: Ureteral Stricture Post Procedure Dx: Same  Successful right sided NUS exchange.    EBL: None   No immediate complications.   Ronny Bacon, MD Pager #: [phone]

## 2017-03-26 IMAGING — XA IR BILIARY CATHETER EXCHANGE
2 series · 7 of 7 positions shown · non-contrast
Comparison: Multiple prior, most recent 04/20/2015

CLINICAL DATA: [AGE] gentleman with a history of radical
cystectomy and reconstruction with diversion. He has had routine
exchanges of nephro ureteral tube through the ostomy. The redundant
anatomy makes this a difficult exchange, and the patient also has
significant debris build up within the tube at a 7 week interval
routine exchange time frame.

EXAM:
PERC TUBE CHG W/CM
TECHNIQUE: The procedure, risks, benefits, and alternatives were explained to
the patient. Questions regarding the procedure were encouraged and
answered. The patient understands and consents to the procedure.

[Series 2: fl - angio · 4 of 11 frames shown]
[frame 2/11]
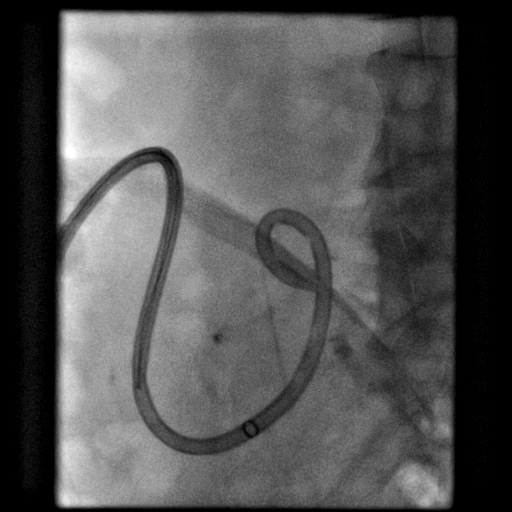
[frame 5/11]
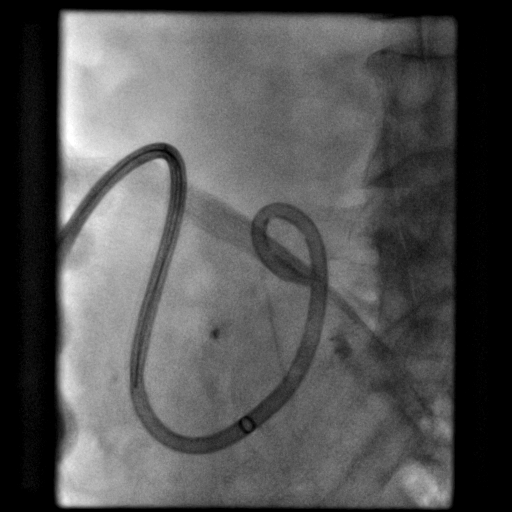
[frame 6/11]
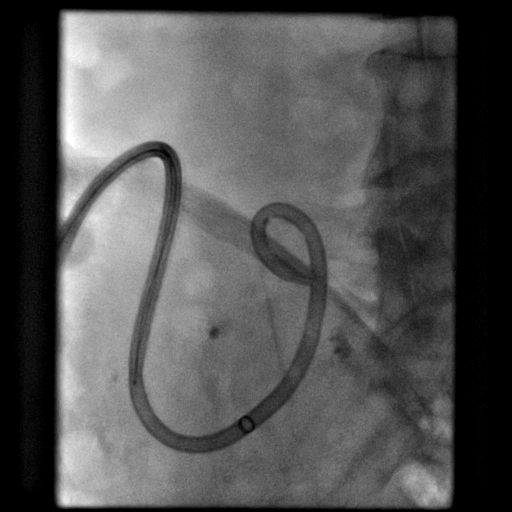
[frame 10/11]
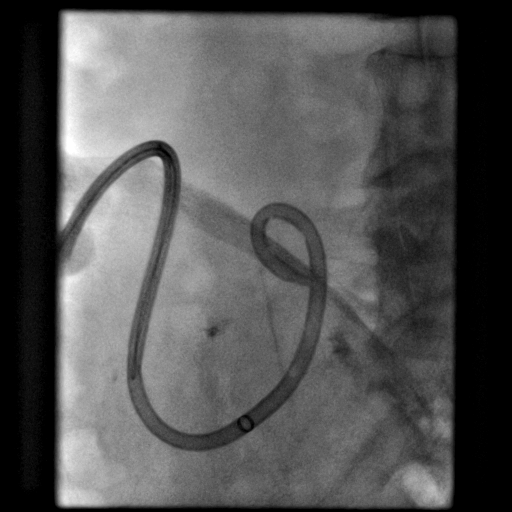

[Series 300: ir nephrostomy tube change · 3 of 3 slices shown]
[im 1/3]
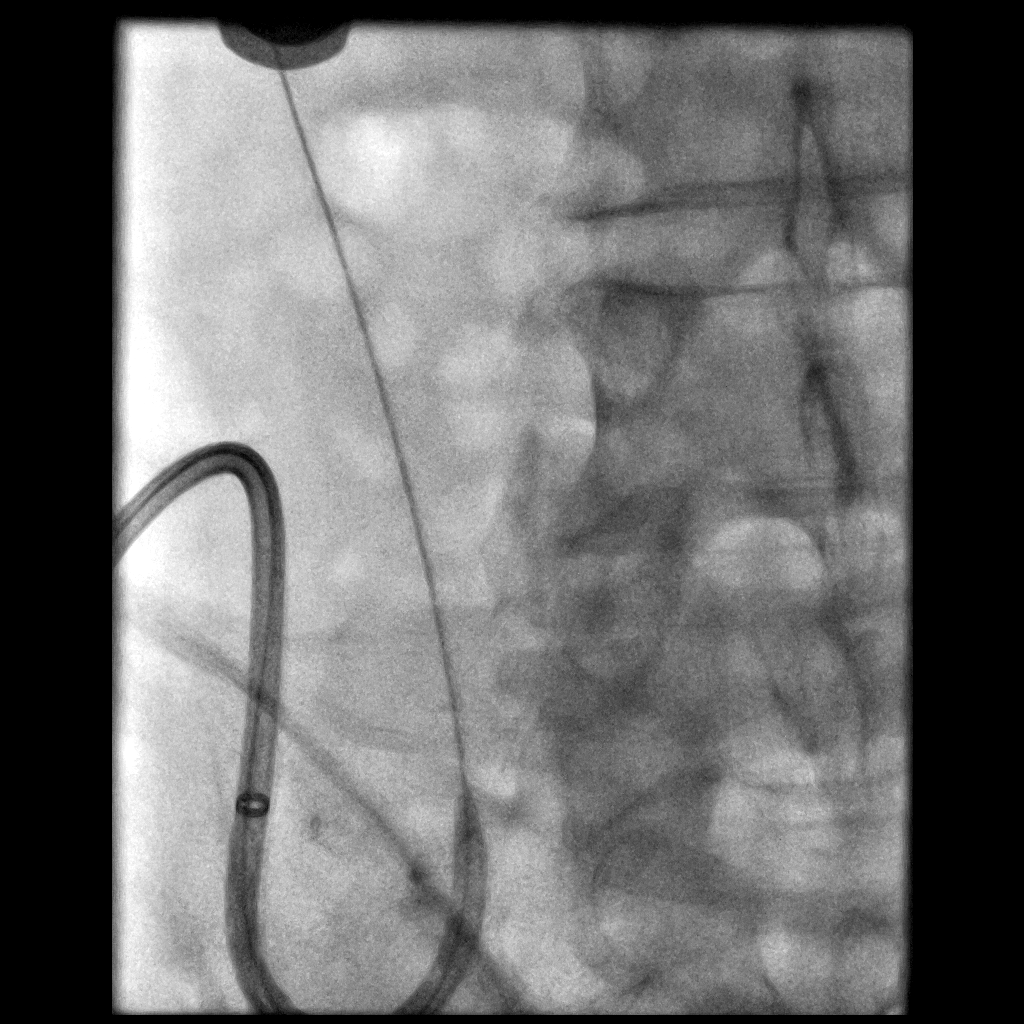
[im 2/3]
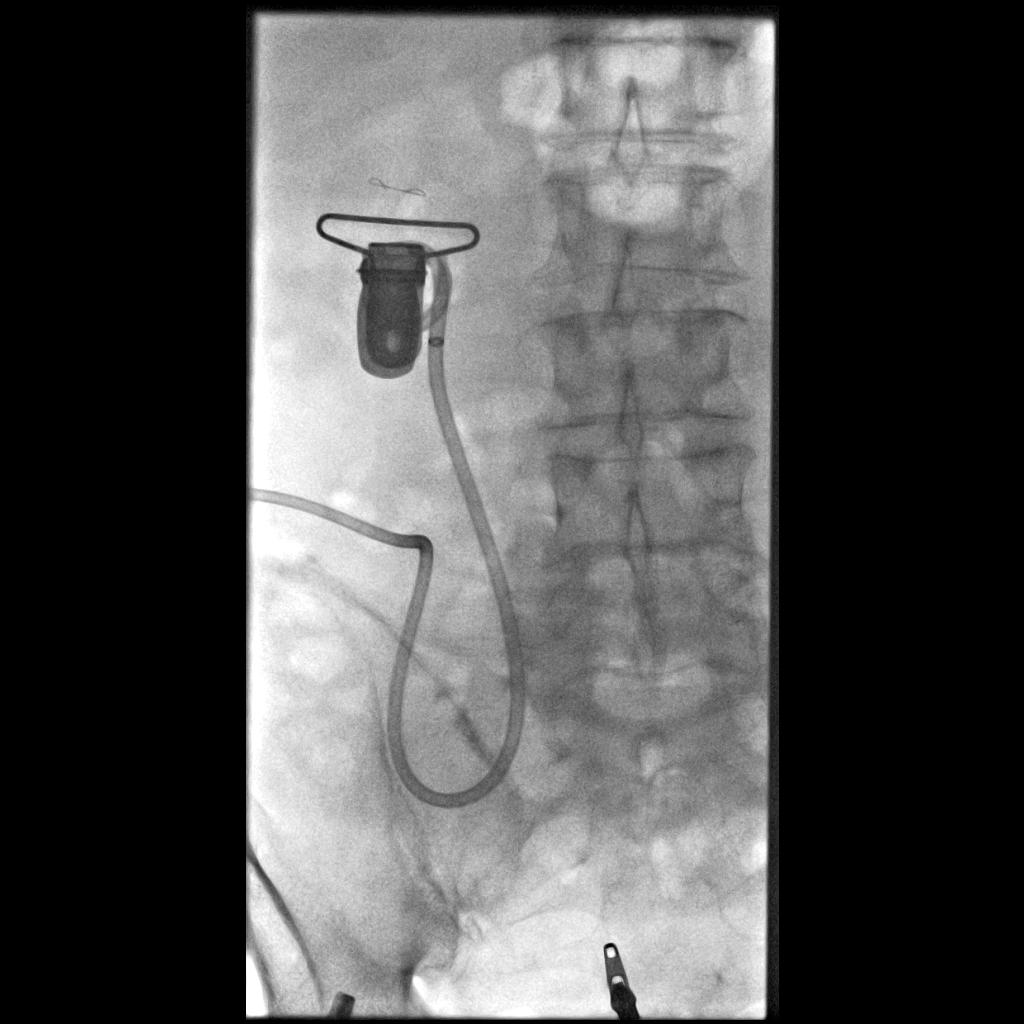
[im 3/3]
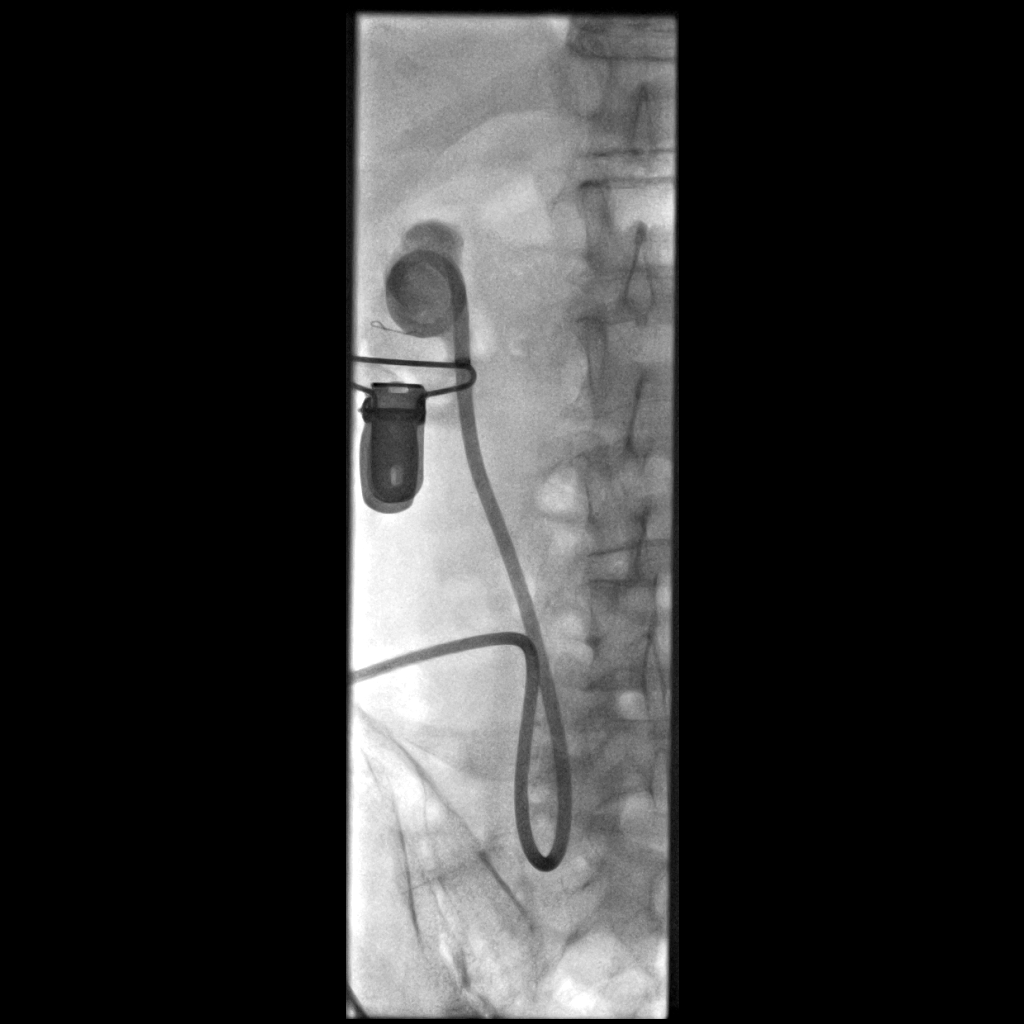

[7 of 7 positions shown; findings below may reference images not displayed]

ANESTHESIA/SEDATION:
None

CONTRAST:  10mL OMNIPAQUE IOHEXOL 300 MG/ML  SOLN

MEDICATIONS:
None

FLUOROSCOPY TIME:  11 minutes 24 seconds
The patient is anterior abdomen, the ostomy, and the indwelling tube
were prepped and draped in the usual sterile fashion.

Attempted contrast injection through the indwelling tube was
unsuccessful given that the tube is obstructed with debris.

Catheter was ligated beyond the ostomy, and initial attempt at
passing a stiff Amplatz wire was unsuccessful, with a blockage of
the tube.

A stiff Glidewire was attempted, which was unsuccessful.

A Glidewire was attempted which was unsuccessful.

At this time, the catheter was partially withdrawn through the
ostomy, in an attempt to externalized the obstructed portion of the
tube and amputate this segment. The plastic stiffener from a 12
French catheter was also used to trans straighten the tube an give
more lab ridge for pushing a stiff Glidewire through the
obstruction. This was unsuccessful.

A significant portion of the tube was required to be withdrawn in
order to amputate the obstructed segment. Manipulation of the tube
was at the skin site, with the ligated segment externalized only 1
cm -2 cm.

Finally a standard Glidewire was advanced through the obstructed
tube, which un curled the pigtail catheter. This Glidewire was then
exchanged for a fresh guidewire which was then passed into the
collecting system.

A 4 French glide cath was then passed over the [place] where into the
collecting system. The Glidewire was removed and a small amount of
contrast confirmed location in the collecting system.

A stiff Amplatz wire was then placed through the glide cath, and a
new 12 French catheter was placed over the Amplatz wire.

Contrast confirmed location of the new catheter.

Patient tolerated the procedure well and remained hemodynamically
stable throughout.

No complications were encountered and no significant blood loss
encountered.

COMPLICATIONS:
None.
FINDINGS: Significant debris was discovered, obstructing the tube. Various
catheters were unsuccessful in passing through the obstructed
segment.

The obstructed segment needed to be externalized and cut in order to
successfully pass a Glidewire.
IMPRESSION: Status post exchange of right-sided nephro ureteral tube, with
placement of a new 45 cm pigtail catheter.

PLAN:
Given that this interval resulted in an obstruction of the tube, I
would encourage this exchange to occur at 5 weeks to 6 weeks so that
obstruction does not occur again. This puts the patient at risk for
hydronephrosis and urosepsis.

## 2017-04-14 ENCOUNTER — Encounter (INDEPENDENT_AMBULATORY_CARE_PROVIDER_SITE_OTHER): Payer: Medicare HMO | Admitting: Ophthalmology

## 2017-04-14 DIAGNOSIS — H353114 Nonexudative age-related macular degeneration, right eye, advanced atrophic with subfoveal involvement: Secondary | ICD-10-CM

## 2017-04-14 DIAGNOSIS — H35033 Hypertensive retinopathy, bilateral: Secondary | ICD-10-CM | POA: Diagnosis not present

## 2017-04-14 DIAGNOSIS — H43813 Vitreous degeneration, bilateral: Secondary | ICD-10-CM

## 2017-04-14 DIAGNOSIS — H353221 Exudative age-related macular degeneration, left eye, with active choroidal neovascularization: Secondary | ICD-10-CM

## 2017-04-14 DIAGNOSIS — I10 Essential (primary) hypertension: Secondary | ICD-10-CM

## 2017-04-17 ENCOUNTER — Ambulatory Visit (HOSPITAL_COMMUNITY)
Admission: RE | Admit: 2017-04-17 | Discharge: 2017-04-17 | Disposition: A | Discharge status: Home / Self Care | Payer: Medicare HMO | Source: Ambulatory Visit | Attending: Interventional Radiology | Admitting: Interventional Radiology

## 2017-04-17 ENCOUNTER — Other Ambulatory Visit (HOSPITAL_COMMUNITY): Payer: Self-pay | Admitting: Interventional Radiology

## 2017-04-17 ENCOUNTER — Encounter (HOSPITAL_COMMUNITY): Payer: Self-pay | Admitting: Interventional Radiology

## 2017-04-17 DIAGNOSIS — C679 Malignant neoplasm of bladder, unspecified: Secondary | ICD-10-CM | POA: Diagnosis not present

## 2017-04-17 DIAGNOSIS — Z466 Encounter for fitting and adjustment of urinary device: Secondary | ICD-10-CM | POA: Diagnosis present

## 2017-04-17 DIAGNOSIS — N135 Crossing vessel and stricture of ureter without hydronephrosis: Secondary | ICD-10-CM | POA: Insufficient documentation

## 2017-04-17 HISTORY — PX: IR NEPHROSTOMY TUBE CHANGE: IMG1442

## 2017-04-17 MED ORDER — IOPAMIDOL (ISOVUE-300) INJECTION 61%
INTRAVENOUS | Status: AC
  Administered 2017-04-17: 15 mL
  Filled 2017-04-17: qty 50

## 2017-04-17 MED ORDER — IOPAMIDOL (ISOVUE-300) INJECTION 61%
50.0000 mL | Freq: Once | INTRAVENOUS | Status: AC | PRN
  Administered 2017-04-17: 15 mL

## 2017-04-18 DIAGNOSIS — M109 Gout, unspecified: Secondary | ICD-10-CM | POA: Diagnosis not present

## 2017-04-18 DIAGNOSIS — Z950 Presence of cardiac pacemaker: Secondary | ICD-10-CM | POA: Diagnosis not present

## 2017-04-18 DIAGNOSIS — N184 Chronic kidney disease, stage 4 (severe): Secondary | ICD-10-CM | POA: Diagnosis not present

## 2017-04-18 DIAGNOSIS — Z936 Other artificial openings of urinary tract status: Secondary | ICD-10-CM | POA: Diagnosis not present

## 2017-04-18 DIAGNOSIS — C679 Malignant neoplasm of bladder, unspecified: Secondary | ICD-10-CM | POA: Diagnosis not present

## 2017-04-18 DIAGNOSIS — Z6825 Body mass index (BMI) 25.0-25.9, adult: Secondary | ICD-10-CM | POA: Diagnosis not present

## 2017-04-18 DIAGNOSIS — I10 Essential (primary) hypertension: Secondary | ICD-10-CM | POA: Diagnosis not present

## 2017-04-21 ENCOUNTER — Encounter (HOSPITAL_COMMUNITY): Payer: Self-pay | Admitting: Interventional Radiology

## 2017-04-21 DIAGNOSIS — C679 Malignant neoplasm of bladder, unspecified: Secondary | ICD-10-CM | POA: Diagnosis not present

## 2017-04-21 DIAGNOSIS — Z936 Other artificial openings of urinary tract status: Secondary | ICD-10-CM | POA: Diagnosis not present

## 2017-04-30 IMAGING — XA IR BILIARY CATHETER EXCHANGE
1 series · 4 of 4 positions shown · non-contrast
Comparison: Prior exchange 06/08/2015

INDICATION: [AGE] male with chronic ureteral stricture. He has a chronic
indwelling retrograde nephro ureteral stent via his right lower
quadrant ileostomy. He presents for routine exchange.

EXAM:
PERC TUBE CHG W/CM

[Series 300: ir nephrostomy tube change · 4 of 4 slices shown]
[im 1/4]
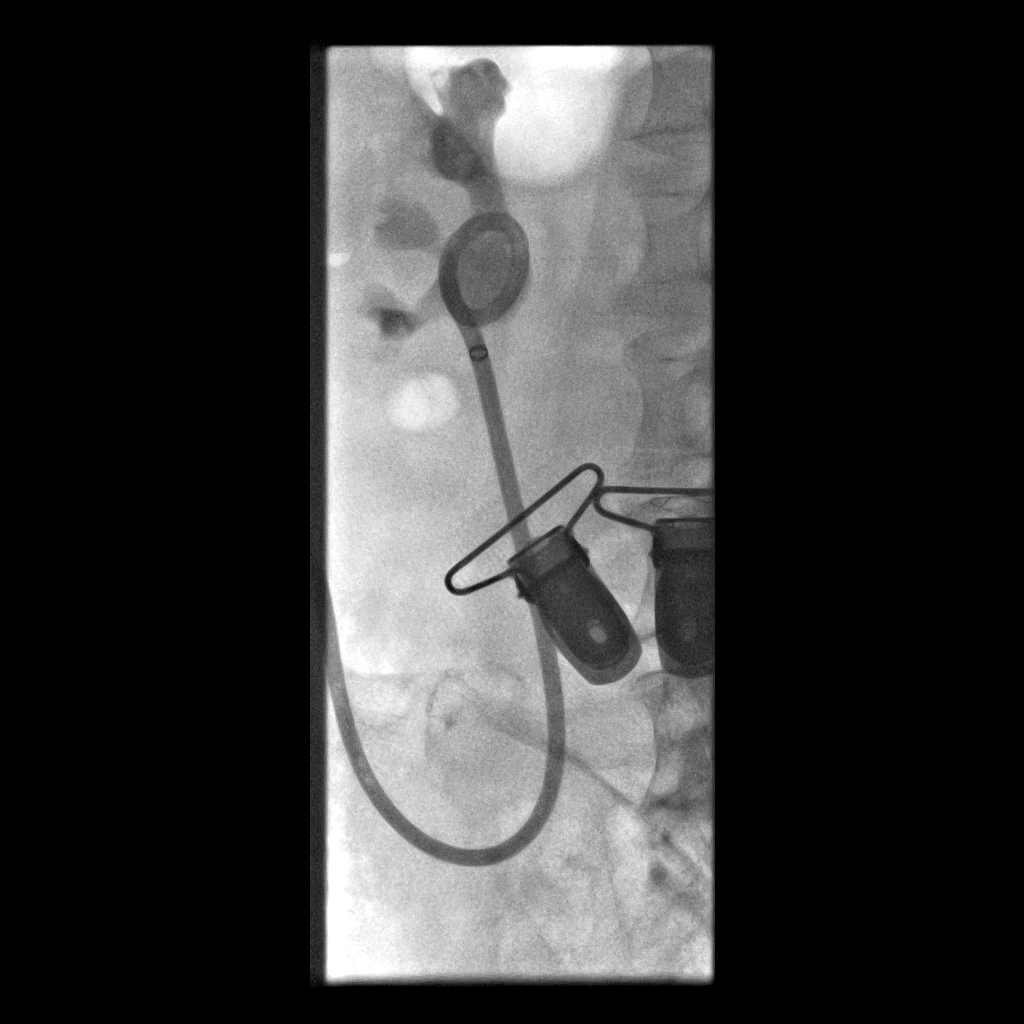
[im 2/4]
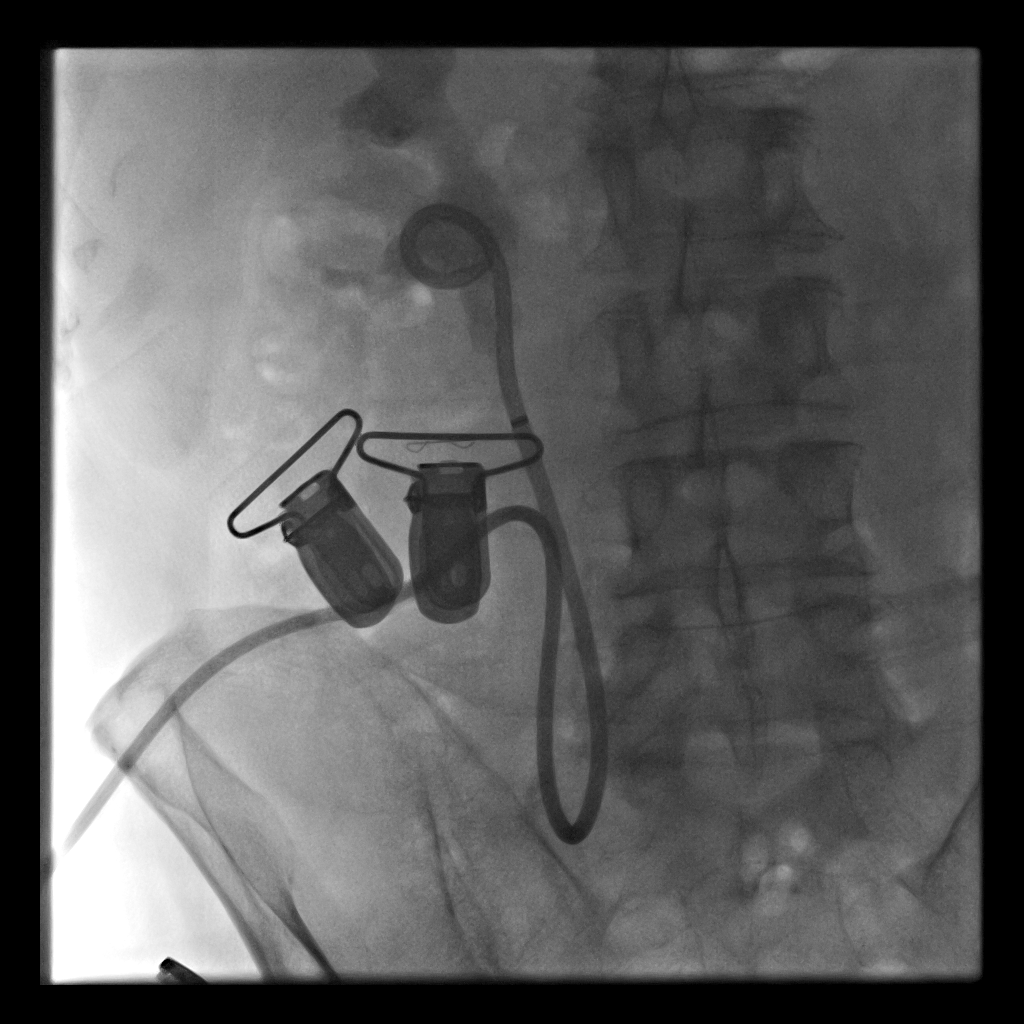
[im 3/4]
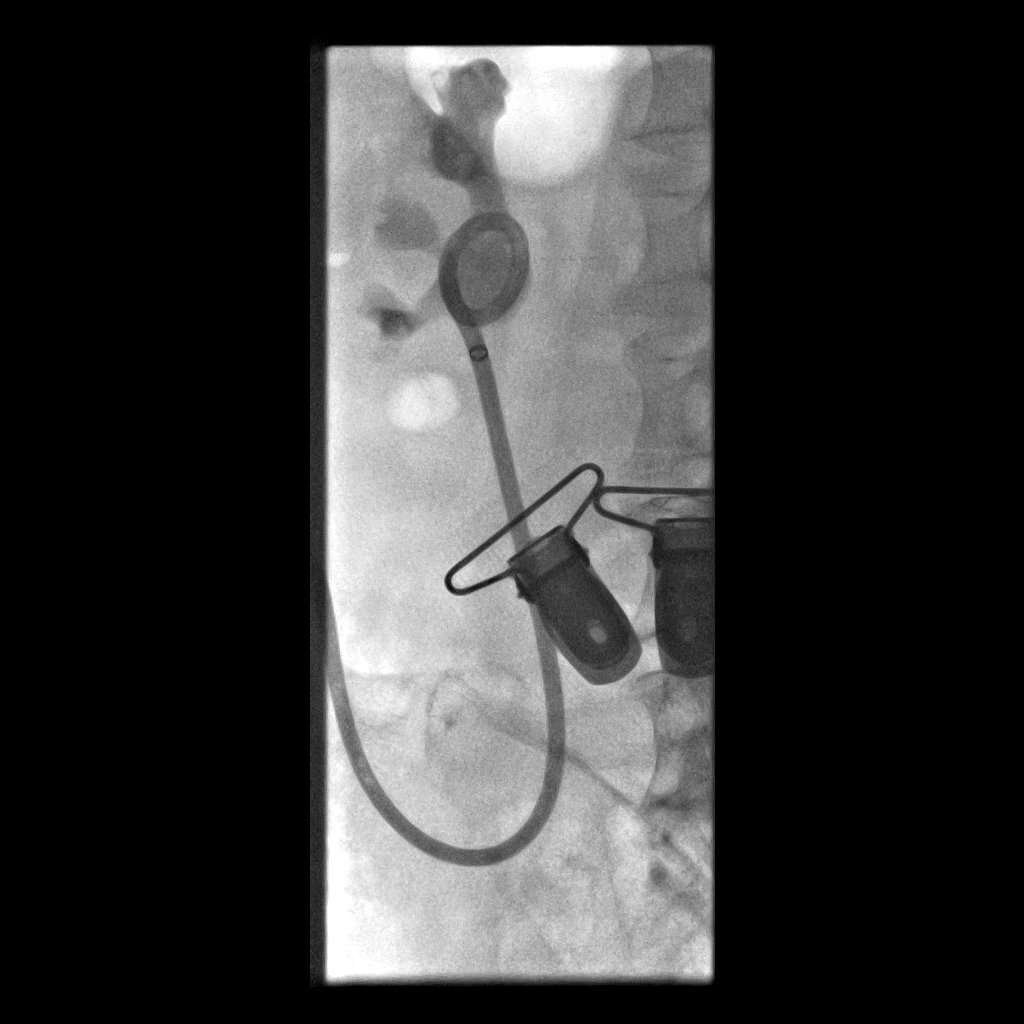
[im 4/4]
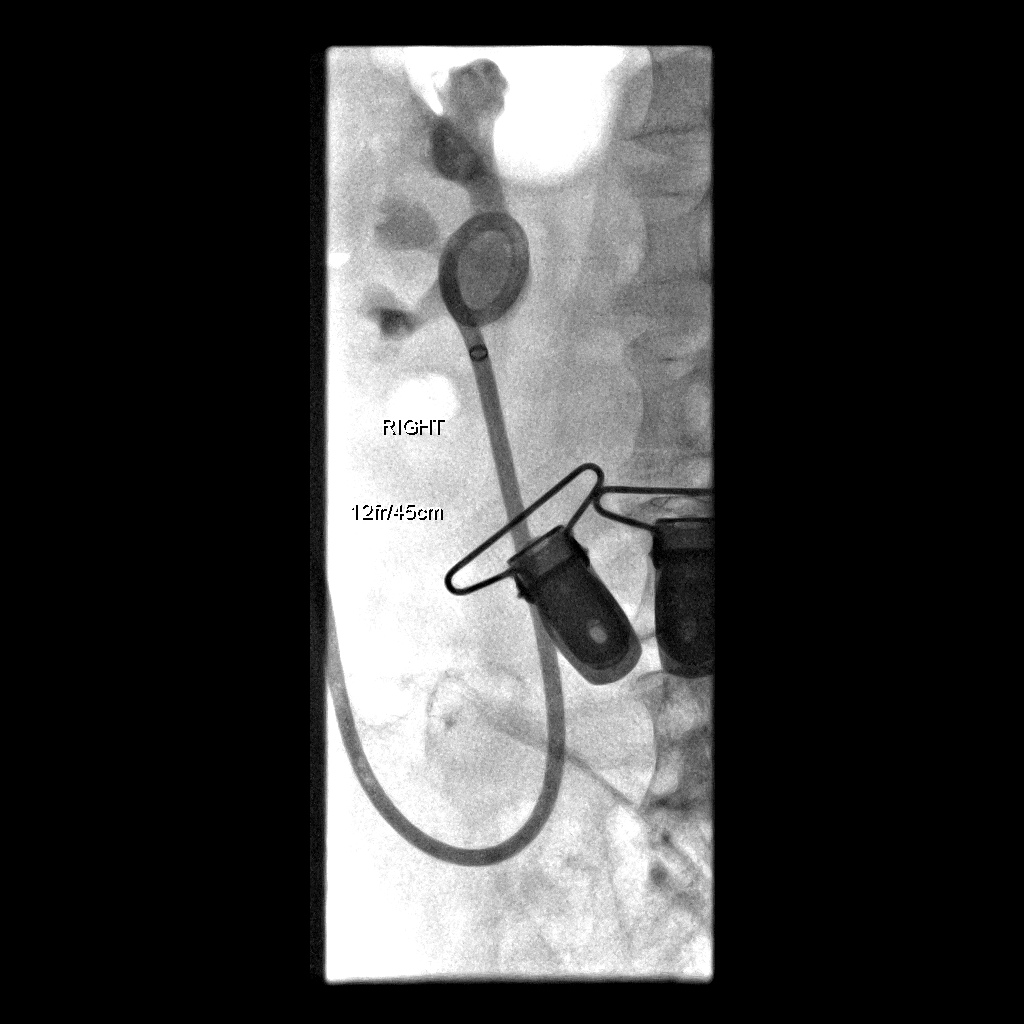

[4 of 4 positions shown; findings below may reference images not displayed]

MEDICATIONS:
None

ANESTHESIA/SEDATION:
None

CONTRAST:  5 mL - administered into the collecting system(s)

FLUOROSCOPY TIME:  Fluoroscopy Time: 1 minutes 36 seconds (29.7
mGy).

COMPLICATIONS:
None immediate.

PROCEDURE:
Informed written consent was obtained from the patient after a
thorough discussion of the procedural risks, benefits and
alternatives. All questions were addressed. Maximal Sterile Barrier
Technique was utilized including caps, mask, sterile gowns, sterile
gloves, sterile drape, hand hygiene and skin antiseptic. A timeout
was performed prior to the initiation of the procedure.

A gentle injection of contrast material through the existing tube
confirmed its location within the renal pelvis. The tube was cut and
removed over a stiff Glidewire. [REDACTED] French 45 cm
multipurpose drainage catheter was advanced over the wire and formed
with the locking loop in the renal pelvis. The hub of the tube was
reinserted into the ostomy bag. The patient tolerated the procedure
well.
IMPRESSION: Successful exchange of chronic retrograde nephro ureteral stent via
right lower quadrant ileostomy.

## 2017-05-22 ENCOUNTER — Ambulatory Visit (HOSPITAL_COMMUNITY)
Admission: RE | Admit: 2017-05-22 | Discharge: 2017-05-22 | Disposition: A | Discharge status: Home / Self Care | Payer: Medicare HMO | Source: Ambulatory Visit | Attending: Interventional Radiology | Admitting: Interventional Radiology

## 2017-05-22 DIAGNOSIS — N135 Crossing vessel and stricture of ureter without hydronephrosis: Secondary | ICD-10-CM

## 2017-05-28 ENCOUNTER — Ambulatory Visit (HOSPITAL_COMMUNITY)
Admission: RE | Admit: 2017-05-28 | Discharge: 2017-05-28 | Disposition: A | Discharge status: Home / Self Care | Payer: Medicare HMO | Source: Ambulatory Visit | Attending: Interventional Radiology | Admitting: Interventional Radiology

## 2017-05-28 ENCOUNTER — Encounter (HOSPITAL_COMMUNITY): Payer: Self-pay | Admitting: Radiology

## 2017-05-28 ENCOUNTER — Other Ambulatory Visit (HOSPITAL_COMMUNITY): Payer: Self-pay | Admitting: Interventional Radiology

## 2017-05-28 DIAGNOSIS — N135 Crossing vessel and stricture of ureter without hydronephrosis: Secondary | ICD-10-CM | POA: Insufficient documentation

## 2017-05-28 DIAGNOSIS — N99522 Malfunction of other external stoma of urinary tract: Secondary | ICD-10-CM | POA: Diagnosis not present

## 2017-05-28 DIAGNOSIS — Z436 Encounter for attention to other artificial openings of urinary tract: Secondary | ICD-10-CM | POA: Insufficient documentation

## 2017-05-28 DIAGNOSIS — C679 Malignant neoplasm of bladder, unspecified: Secondary | ICD-10-CM | POA: Diagnosis not present

## 2017-05-28 HISTORY — PX: IR NEPHROSTOMY TUBE CHANGE: IMG1442

## 2017-05-28 MED ORDER — IOPAMIDOL (ISOVUE-300) INJECTION 61%
10.0000 mL | Freq: Once | INTRAVENOUS | Status: AC | PRN
  Administered 2017-05-28: 10 mL

## 2017-05-28 MED ORDER — IOPAMIDOL (ISOVUE-300) INJECTION 61%
INTRAVENOUS | Status: AC
  Administered 2017-05-28: 10 mL
  Filled 2017-05-28: qty 50

## 2017-06-03 ENCOUNTER — Ambulatory Visit (INDEPENDENT_AMBULATORY_CARE_PROVIDER_SITE_OTHER): Payer: Medicare HMO | Admitting: *Deleted

## 2017-06-03 ENCOUNTER — Encounter (INDEPENDENT_AMBULATORY_CARE_PROVIDER_SITE_OTHER): Payer: Self-pay

## 2017-06-03 DIAGNOSIS — R001 Bradycardia, unspecified: Secondary | ICD-10-CM

## 2017-06-03 LAB — CUP PACEART INCLINIC DEVICE CHECK
Battery Impedance: 1771 Ohm
Battery Voltage: 2.75 V
Brady Statistic AP VP Percent: 60 %
Brady Statistic AP VS Percent: 39 %
Date Time Interrogation Session: 20190312144142
Implantable Lead Implant Date: 20090917
Implantable Lead Implant Date: 20090917
Implantable Lead Location: 753860
Implantable Lead Model: 5076
Lead Channel Impedance Value: 461 Ohm
Lead Channel Impedance Value: 520 Ohm
Lead Channel Pacing Threshold Amplitude: 0.75 V
Lead Channel Pacing Threshold Pulse Width: 0.4 ms
Lead Channel Pacing Threshold Pulse Width: 0.4 ms
Lead Channel Setting Pacing Amplitude: 2 V
Lead Channel Setting Pacing Pulse Width: 0.4 ms
MDC IDC LEAD LOCATION: 753859
MDC IDC MSMT BATTERY REMAINING LONGEVITY: 34 mo
MDC IDC MSMT LEADCHNL RA PACING THRESHOLD AMPLITUDE: 0.5 V
MDC IDC MSMT LEADCHNL RA PACING THRESHOLD AMPLITUDE: 0.75 V
MDC IDC MSMT LEADCHNL RA PACING THRESHOLD PULSEWIDTH: 0.4 ms
MDC IDC MSMT LEADCHNL RV PACING THRESHOLD AMPLITUDE: 0.5 V
MDC IDC MSMT LEADCHNL RV PACING THRESHOLD PULSEWIDTH: 0.4 ms
MDC IDC MSMT LEADCHNL RV SENSING INTR AMPL: 4 mV
MDC IDC PG IMPLANT DT: 20090917
MDC IDC SET LEADCHNL RV PACING AMPLITUDE: 2.5 V
MDC IDC SET LEADCHNL RV SENSING SENSITIVITY: 2 mV
MDC IDC STAT BRADY AS VP PERCENT: 0 %
MDC IDC STAT BRADY AS VS PERCENT: 1 %

## 2017-06-03 NOTE — Progress Notes (Signed)
Pacemaker check in clinic. Normal device function. Thresholds, sensing, impedances consistent with previous measurements. Device programmed to maximize longevity. 7 mode switches (<0.1%). No high ventricular rates noted. Device programmed at appropriate safety margins. Histogram distribution appropriate for patient activity level. Device programmed to optimize intrinsic conduction. Estimated longevity 1-4.4yrs. ROV with SK in 69mo.

## 2017-06-04 IMAGING — XA IR BILIARY CATHETER EXCHANGE
1 series · 5 of 5 positions shown · non-contrast
Comparison: None.

INDICATION: Chronic indwelling retrograde nephro ureteral stent via right lower
quadrant ileostomy. Routine exchange.

EXAM:
FLUOROSCOPIC GUIDED RIGHT SIDED NEPHRO URETERAL CATHETER EXCHANGE
TECHNIQUE: Informed written consent was obtained from the patient after a
discussion of the risks, benefits and alternatives to treatment.
Questions regarding the procedure were encouraged and answered. A
timeout was performed prior to the initiation of the procedure.

[Series 300: tube placements · 5 of 5 slices shown]
[im 1/5]
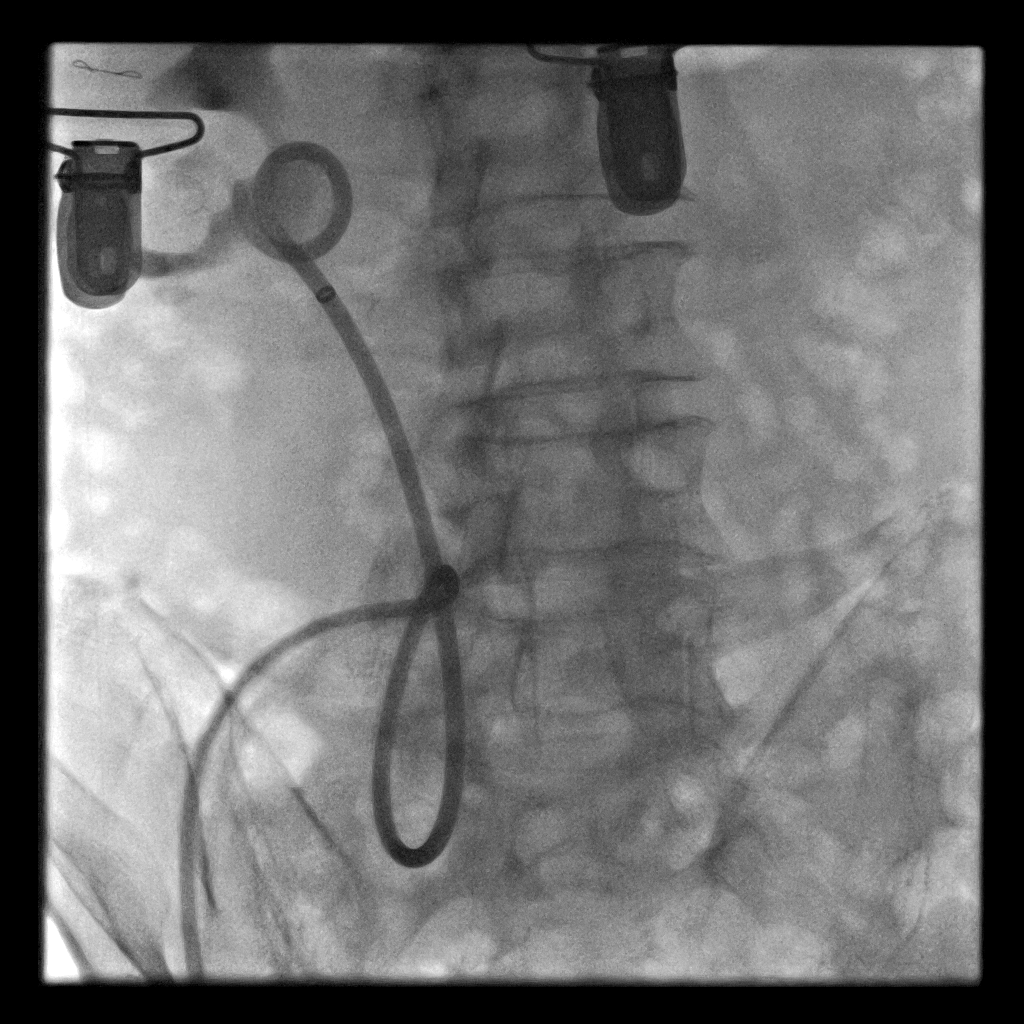
[im 2/5]
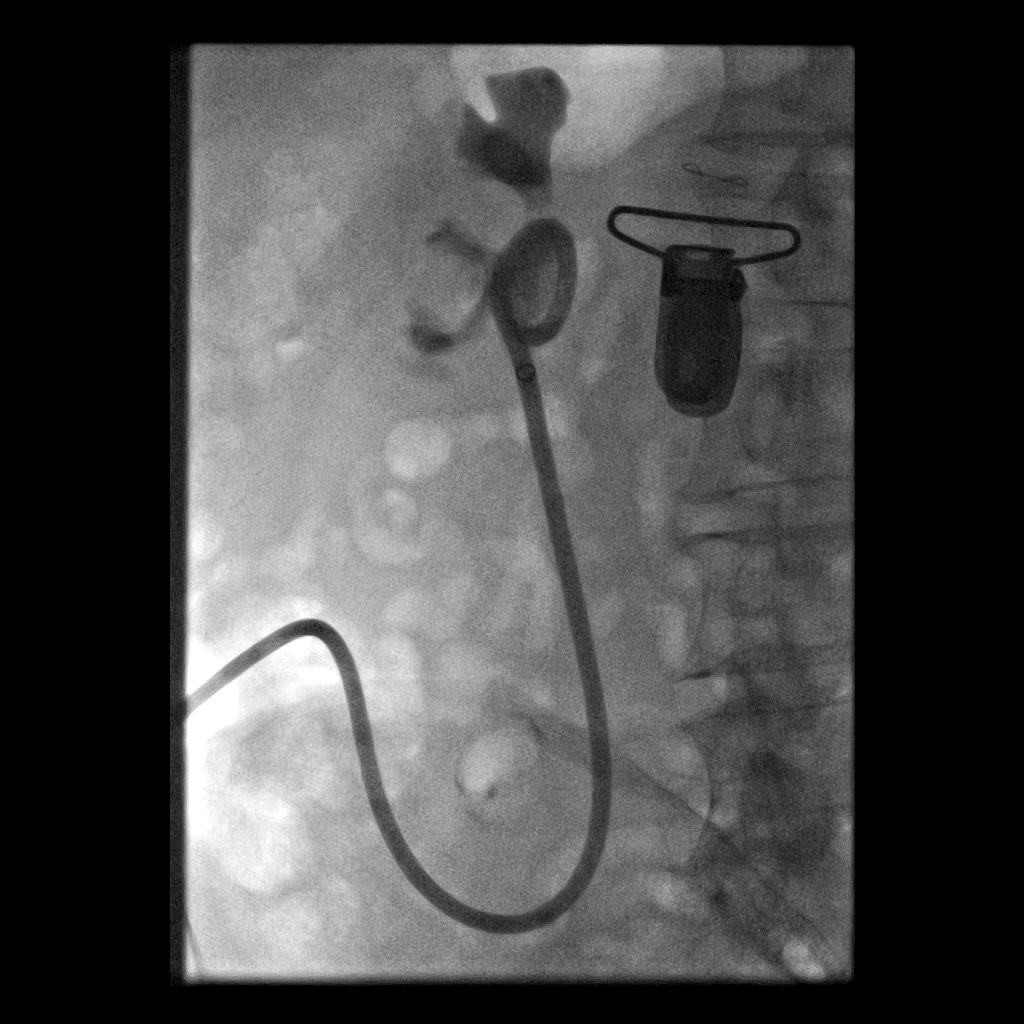
[im 3/5]
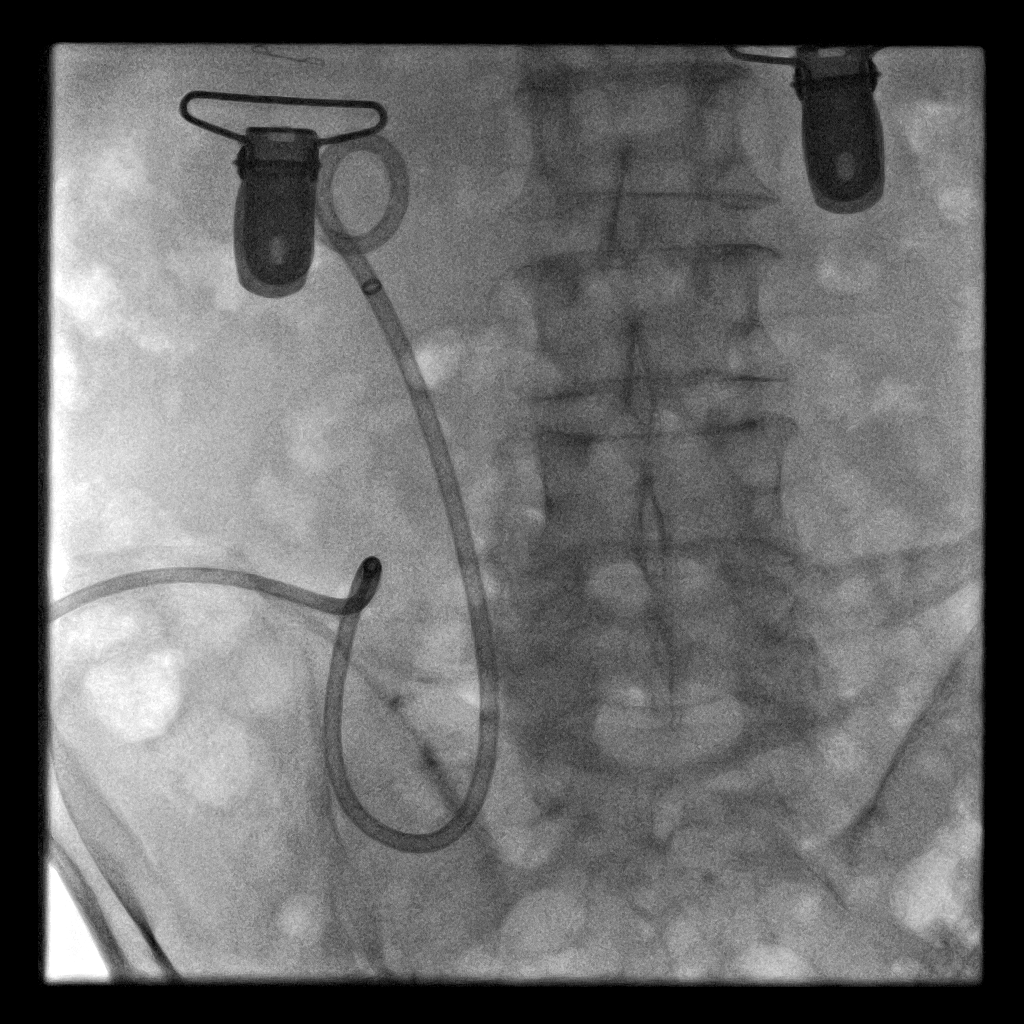
[im 4/5]
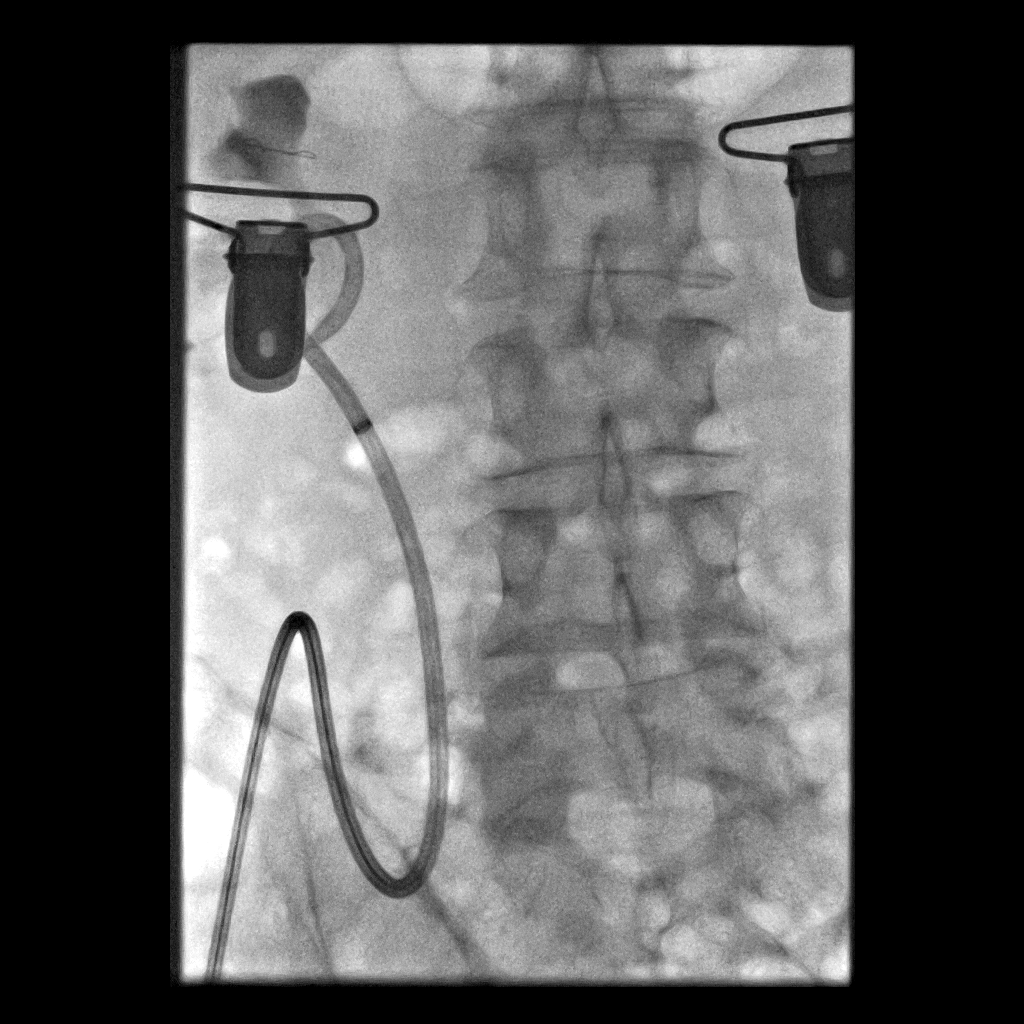
[im 5/5]
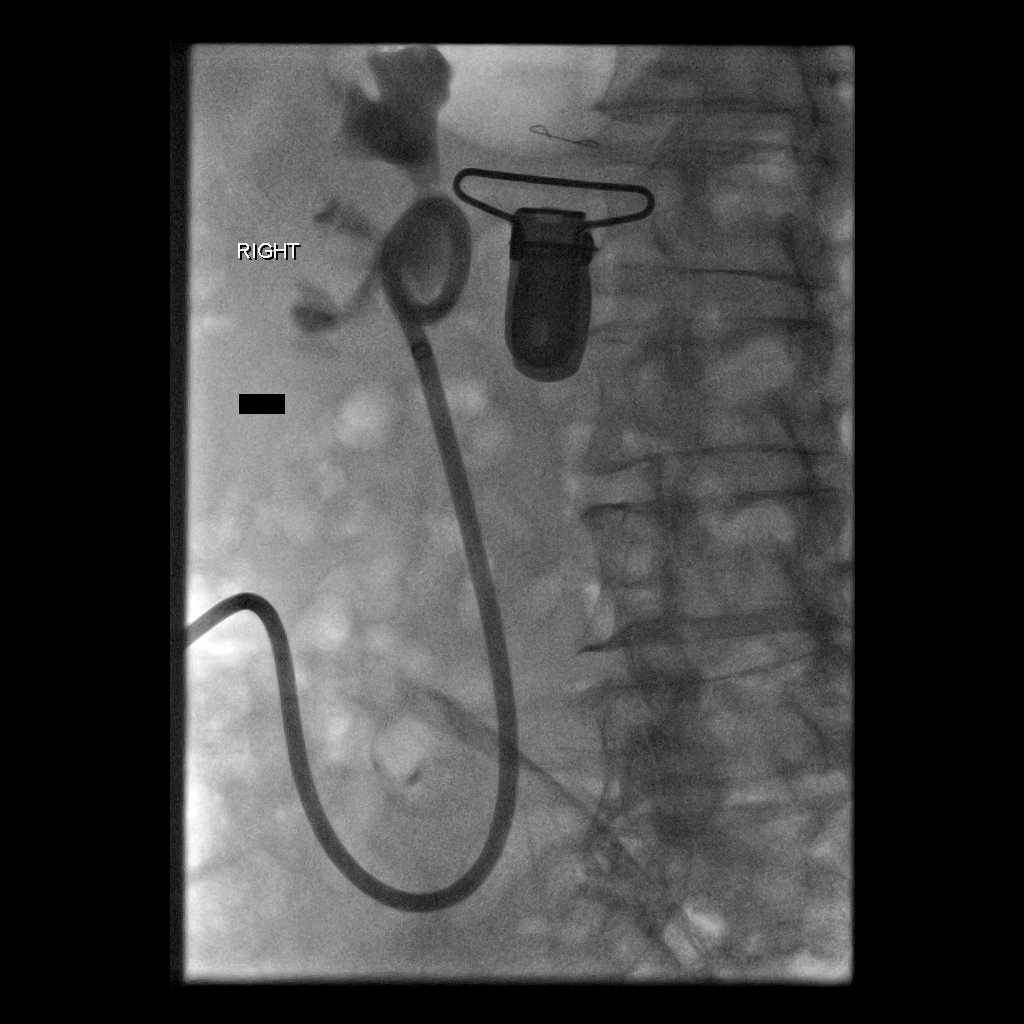

[5 of 5 positions shown; findings below may reference images not displayed]

CONTRAST:  5 mL Fsovue-033 administered into the collecting system

FLUOROSCOPY TIME:  1 minutes 24 seconds (23 mGy)

COMPLICATIONS:
None immediate.
The external portion of the right-sided retrograde nephro ureteral
catheter as well as the surrounding skin and urostomy were prepped
and draped in the usual sterile fashion. A sterile drape was applied
covering the operative field. Maximum barrier sterile technique with
sterile gowns and gloves were used for the procedure. A timeout was
performed prior to the initiation of the procedure.

A pre procedural spot fluoroscopic image was obtained after contrast
was injected via the existing nephro ureteral catheter demonstrating
appropriate positioning within the renal pelvis. The existing
nephrostomy catheter was cut and cannulated with an Amplatz wire
which was coiled within the renal pelvis. Under intermittent
fluoroscopic guidance, the existing nephrostomy catheter was
exchanged for a new 12 French 45 cm all-purpose drainage catheter.
Contrast injection confirmed appropriate positioning within the
renal pelvis and a post exchange fluoroscopic image was obtained.
The catheter was locked and secured to the skin with a suture. A
dressing was placed. The patient tolerated the procedure well
without immediate postprocedural complication.
FINDINGS: The existing nephrostomy catheter is appropriately positioned and
functioning.

After successful fluoroscopic guided exchange, the new nephro
ureteral catheter is coiled and locked within the right renal
pelvis.
IMPRESSION: Successful fluoroscopic guided exchange of right sided 12 French
nephro ureteral catheter via right lower quadrant urostomy.

## 2017-06-13 ENCOUNTER — Encounter (HOSPITAL_COMMUNITY): Payer: Self-pay | Admitting: Emergency Medicine

## 2017-06-13 ENCOUNTER — Other Ambulatory Visit: Payer: Self-pay

## 2017-06-13 ENCOUNTER — Emergency Department (HOSPITAL_COMMUNITY): Payer: MEDICARE

## 2017-06-13 ENCOUNTER — Inpatient Hospital Stay (HOSPITAL_COMMUNITY): Payer: MEDICARE

## 2017-06-13 ENCOUNTER — Inpatient Hospital Stay (HOSPITAL_COMMUNITY)
Admission: EM | Admit: 2017-06-13 | Discharge: 2017-06-15 | DRG: 699 | Disposition: A | Discharge status: Home / Self Care | Payer: MEDICARE | Attending: Internal Medicine | Admitting: Internal Medicine

## 2017-06-13 DIAGNOSIS — Z66 Do not resuscitate: Secondary | ICD-10-CM | POA: Diagnosis not present

## 2017-06-13 DIAGNOSIS — I129 Hypertensive chronic kidney disease with stage 1 through stage 4 chronic kidney disease, or unspecified chronic kidney disease: Secondary | ICD-10-CM | POA: Diagnosis not present

## 2017-06-13 DIAGNOSIS — N179 Acute kidney failure, unspecified: Secondary | ICD-10-CM | POA: Diagnosis present

## 2017-06-13 DIAGNOSIS — H548 Legal blindness, as defined in USA: Secondary | ICD-10-CM | POA: Diagnosis present

## 2017-06-13 DIAGNOSIS — M109 Gout, unspecified: Secondary | ICD-10-CM | POA: Diagnosis not present

## 2017-06-13 DIAGNOSIS — N131 Hydronephrosis with ureteral stricture, not elsewhere classified: Secondary | ICD-10-CM

## 2017-06-13 DIAGNOSIS — N1330 Unspecified hydronephrosis: Secondary | ICD-10-CM | POA: Diagnosis not present

## 2017-06-13 DIAGNOSIS — I10 Essential (primary) hypertension: Secondary | ICD-10-CM | POA: Diagnosis not present

## 2017-06-13 DIAGNOSIS — T83192A Other mechanical complication of urinary stent, initial encounter: Secondary | ICD-10-CM | POA: Diagnosis not present

## 2017-06-13 DIAGNOSIS — N189 Chronic kidney disease, unspecified: Secondary | ICD-10-CM | POA: Diagnosis present

## 2017-06-13 DIAGNOSIS — C679 Malignant neoplasm of bladder, unspecified: Secondary | ICD-10-CM

## 2017-06-13 DIAGNOSIS — Z8551 Personal history of malignant neoplasm of bladder: Secondary | ICD-10-CM

## 2017-06-13 DIAGNOSIS — T83012A Breakdown (mechanical) of nephrostomy catheter, initial encounter: Secondary | ICD-10-CM | POA: Diagnosis present

## 2017-06-13 DIAGNOSIS — T83098A Other mechanical complication of other indwelling urethral catheter, initial encounter: Secondary | ICD-10-CM

## 2017-06-13 DIAGNOSIS — Z8546 Personal history of malignant neoplasm of prostate: Secondary | ICD-10-CM | POA: Diagnosis not present

## 2017-06-13 DIAGNOSIS — T83011A Breakdown (mechanical) of indwelling urethral catheter, initial encounter: Secondary | ICD-10-CM

## 2017-06-13 DIAGNOSIS — Z79899 Other long term (current) drug therapy: Secondary | ICD-10-CM | POA: Diagnosis not present

## 2017-06-13 DIAGNOSIS — Z7982 Long term (current) use of aspirin: Secondary | ICD-10-CM

## 2017-06-13 DIAGNOSIS — N200 Calculus of kidney: Secondary | ICD-10-CM | POA: Diagnosis not present

## 2017-06-13 DIAGNOSIS — Q6210 Congenital occlusion of ureter, unspecified: Secondary | ICD-10-CM | POA: Diagnosis not present

## 2017-06-13 DIAGNOSIS — Z936 Other artificial openings of urinary tract status: Secondary | ICD-10-CM | POA: Diagnosis not present

## 2017-06-13 DIAGNOSIS — N132 Hydronephrosis with renal and ureteral calculous obstruction: Secondary | ICD-10-CM | POA: Diagnosis not present

## 2017-06-13 HISTORY — DX: Disorder of kidney and ureter, unspecified: N28.9

## 2017-06-13 HISTORY — DX: Malignant (primary) neoplasm, unspecified: C80.1

## 2017-06-13 HISTORY — PX: IR NEPHROSTOMY PLACEMENT RIGHT: IMG6064

## 2017-06-13 LAB — CBC WITH DIFFERENTIAL/PLATELET
BASOS ABS: 0 10*3/uL (ref 0.0–0.1)
Basophils Relative: 1 %
EOS ABS: 0.5 10*3/uL (ref 0.0–0.7)
EOS PCT: 6 %
HCT: 34.4 % — ABNORMAL LOW (ref 39.0–52.0)
Hemoglobin: 11.5 g/dL — ABNORMAL LOW (ref 13.0–17.0)
LYMPHS ABS: 2.1 10*3/uL (ref 0.7–4.0)
LYMPHS PCT: 26 %
MCH: 31.9 pg (ref 26.0–34.0)
MCHC: 33.4 g/dL (ref 30.0–36.0)
MCV: 95.3 fL (ref 78.0–100.0)
MONO ABS: 0.5 10*3/uL (ref 0.1–1.0)
Monocytes Relative: 6 %
Neutro Abs: 5.1 10*3/uL (ref 1.7–7.7)
Neutrophils Relative %: 61 %
PLATELETS: 200 10*3/uL (ref 150–400)
RBC: 3.61 MIL/uL — AB (ref 4.22–5.81)
RDW: 13.5 % (ref 11.5–15.5)
WBC: 8.2 10*3/uL (ref 4.0–10.5)

## 2017-06-13 LAB — COMPREHENSIVE METABOLIC PANEL
ALT: 14 U/L — AB (ref 17–63)
AST: 25 U/L (ref 15–41)
Albumin: 3.5 g/dL (ref 3.5–5.0)
Alkaline Phosphatase: 53 U/L (ref 38–126)
Anion gap: 7 (ref 5–15)
BUN: 43 mg/dL — ABNORMAL HIGH (ref 6–20)
CHLORIDE: 109 mmol/L (ref 101–111)
CO2: 25 mmol/L (ref 22–32)
Calcium: 8.9 mg/dL (ref 8.9–10.3)
Creatinine, Ser: 1.8 mg/dL — ABNORMAL HIGH (ref 0.61–1.24)
GFR, EST AFRICAN AMERICAN: 36 mL/min — AB (ref >60)
GFR, EST NON AFRICAN AMERICAN: 31 mL/min — AB (ref >60)
Glucose, Bld: 89 mg/dL (ref 65–99)
POTASSIUM: 4.6 mmol/L (ref 3.5–5.1)
Sodium: 141 mmol/L (ref 135–145)
Total Bilirubin: 0.5 mg/dL (ref 0.3–1.2)
Total Protein: 7 g/dL (ref 6.5–8.1)

## 2017-06-13 LAB — PROTIME-INR
INR: 1.02
PROTHROMBIN TIME: 13.3 s (ref 11.4–15.2)

## 2017-06-13 MED ORDER — DOCUSATE SODIUM 100 MG PO CAPS
200.0000 mg | ORAL_CAPSULE | Freq: Every day | ORAL | Status: DC
  Administered 2017-06-13 – 2017-06-15 (×3): 200 mg via ORAL
  Filled 2017-06-13 (×4): qty 2

## 2017-06-13 MED ORDER — TRAZODONE HCL 50 MG PO TABS: 25.0000 mg | ORAL_TABLET | Freq: Every evening | ORAL | Status: DC | PRN

## 2017-06-13 MED ORDER — CEFAZOLIN SODIUM-DEXTROSE 2-4 GM/100ML-% IV SOLN
INTRAVENOUS | Status: AC
  Administered 2017-06-13: 2 g via INTRAVENOUS
  Filled 2017-06-13: qty 100

## 2017-06-13 MED ORDER — FENTANYL CITRATE (PF) 100 MCG/2ML IJ SOLN
INTRAMUSCULAR | Status: AC | PRN
  Administered 2017-06-13: 50 ug via INTRAVENOUS

## 2017-06-13 MED ORDER — FEBUXOSTAT 40 MG PO TABS
40.0000 mg | ORAL_TABLET | Freq: Every day | ORAL | Status: DC
  Administered 2017-06-13 – 2017-06-15 (×3): 40 mg via ORAL
  Filled 2017-06-13 (×3): qty 1

## 2017-06-13 MED ORDER — CALCIUM POLYCARBOPHIL 625 MG PO TABS
1250.0000 mg | ORAL_TABLET | Freq: Every day | ORAL | Status: DC
  Administered 2017-06-14: 1250 mg via ORAL
  Filled 2017-06-13 (×3): qty 2

## 2017-06-13 MED ORDER — AMLODIPINE BESYLATE 5 MG PO TABS
2.5000 mg | ORAL_TABLET | Freq: Every day | ORAL | Status: DC
  Administered 2017-06-13 – 2017-06-15 (×3): 2.5 mg via ORAL
  Filled 2017-06-13 (×3): qty 1

## 2017-06-13 MED ORDER — LIDOCAINE HCL 1 % IJ SOLN
INTRAMUSCULAR | Status: AC | PRN
  Administered 2017-06-13: 10 mL

## 2017-06-13 MED ORDER — VITAMIN D3 25 MCG (1000 UNIT) PO TABS
1000.0000 [IU] | ORAL_TABLET | Freq: Every day | ORAL | Status: DC
  Administered 2017-06-13 – 2017-06-15 (×3): 1000 [IU] via ORAL
  Filled 2017-06-13 (×3): qty 1

## 2017-06-13 MED ORDER — ACETAMINOPHEN 650 MG RE SUPP: 650.0000 mg | Freq: Four times a day (QID) | RECTAL | Status: DC | PRN

## 2017-06-13 MED ORDER — TRAMADOL HCL 50 MG PO TABS: 50.0000 mg | ORAL_TABLET | Freq: Four times a day (QID) | ORAL | Status: DC | PRN

## 2017-06-13 MED ORDER — KRILL OIL 1000 MG PO CAPS: 2000.0000 mg | ORAL_CAPSULE | Freq: Every day | ORAL | Status: DC

## 2017-06-13 MED ORDER — SODIUM CHLORIDE 0.9 % IV SOLN
INTRAVENOUS | Status: AC
  Administered 2017-06-13 – 2017-06-14 (×2): via INTRAVENOUS

## 2017-06-13 MED ORDER — SENNOSIDES-DOCUSATE SODIUM 8.6-50 MG PO TABS: 1.0000 | ORAL_TABLET | Freq: Every evening | ORAL | Status: DC | PRN

## 2017-06-13 MED ORDER — MIDAZOLAM HCL 2 MG/2ML IJ SOLN
INTRAMUSCULAR | Status: AC | PRN
  Administered 2017-06-13 (×2): 0.5 mg via INTRAVENOUS

## 2017-06-13 MED ORDER — VITEYES AREDS ADVANCED PO CAPS: ORAL_CAPSULE | Freq: Every day | ORAL | Status: DC

## 2017-06-13 MED ORDER — ASPIRIN EC 81 MG PO TBEC
81.0000 mg | DELAYED_RELEASE_TABLET | Freq: Every day | ORAL | Status: DC
  Administered 2017-06-14 – 2017-06-15 (×2): 81 mg via ORAL
  Filled 2017-06-13 (×3): qty 1

## 2017-06-13 MED ORDER — CEFAZOLIN SODIUM-DEXTROSE 2-4 GM/100ML-% IV SOLN
2.0000 g | INTRAVENOUS | Status: AC
  Administered 2017-06-13: 2 g via INTRAVENOUS
  Filled 2017-06-13: qty 100

## 2017-06-13 MED ORDER — MIDAZOLAM HCL 2 MG/2ML IJ SOLN
INTRAMUSCULAR | Status: AC
  Filled 2017-06-13: qty 4

## 2017-06-13 MED ORDER — PROSIGHT PO TABS
1.0000 | ORAL_TABLET | Freq: Every day | ORAL | Status: DC
  Administered 2017-06-14: 1 via ORAL
  Filled 2017-06-13 (×3): qty 1

## 2017-06-13 MED ORDER — IOPAMIDOL (ISOVUE-300) INJECTION 61%
50.0000 mL | Freq: Once | INTRAVENOUS | Status: AC | PRN
  Administered 2017-06-13: 10 mL

## 2017-06-13 MED ORDER — LIDOCAINE HCL 1 % IJ SOLN
INTRAMUSCULAR | Status: AC
  Filled 2017-06-13: qty 20

## 2017-06-13 MED ORDER — COLCHICINE 0.6 MG PO TABS
0.6000 mg | ORAL_TABLET | Freq: Every day | ORAL | Status: DC
  Administered 2017-06-14: 0.6 mg via ORAL
  Filled 2017-06-13 (×3): qty 1

## 2017-06-13 MED ORDER — IOPAMIDOL (ISOVUE-300) INJECTION 61%
INTRAVENOUS | Status: AC
  Filled 2017-06-13: qty 50

## 2017-06-13 MED ORDER — OMEGA-3-ACID ETHYL ESTERS 1 G PO CAPS
1.0000 g | ORAL_CAPSULE | Freq: Every day | ORAL | Status: DC
  Administered 2017-06-14: 1 g via ORAL
  Filled 2017-06-13 (×3): qty 1

## 2017-06-13 MED ORDER — FENTANYL CITRATE (PF) 100 MCG/2ML IJ SOLN
INTRAMUSCULAR | Status: AC
  Filled 2017-06-13: qty 4

## 2017-06-13 MED ORDER — ACETAMINOPHEN 325 MG PO TABS: 650.0000 mg | ORAL_TABLET | Freq: Four times a day (QID) | ORAL | Status: DC | PRN

## 2017-06-13 MED ORDER — BISACODYL 5 MG PO TBEC: 5.0000 mg | DELAYED_RELEASE_TABLET | Freq: Every day | ORAL | Status: DC | PRN

## 2017-06-13 NOTE — ED Notes (Signed)
Pt stated that his ileostomy fell out at around 1430 today. It is still draining some. Other then slight pain in his R flank he has no complaints.

## 2017-06-13 NOTE — Progress Notes (Signed)
Referring Physician(s): Keene Breath  Supervising Physician: Jacqulynn Cadet  Patient Status:  [facility] ED TBA  Chief Complaint:  Dislodged nephroureteral catheter  Subjective: Patient familiar to IR service from multiple nephroureteral catheter exchanges.  He has a history of prostate/bladder carcinoma, status post ileal conduit with ureteral stricture and requiring long-term indwelling nephroureteral catheter.  He presented to the ED today with displacement of nephroureteral catheter and request received for replacement.  He currently denies fever, headache, chest pain, dyspnea, cough, nausea, vomiting or bleeding.  He does have some intermittent low back pain.  Additional history as below. Past Medical History:  Diagnosis Date  . Cancer [facility])    bladder cancer  . Hypertension   . Renal disorder      Allergies: Patient has no known allergies.  Medications: Prior to Admission medications   Medication Sig Start Date End Date Taking? Authorizing Provider  amLODipine (NORVASC) 2.5 MG tablet Take 2.5 mg by mouth daily.   Yes [provider]  aspirin EC 81 MG tablet Take 81 mg by mouth daily.   Yes [provider]  Calcium Polycarbophil (FIBER-CAPS PO) Take 2 capsules by mouth daily.   Yes [provider]  cholecalciferol (VITAMIN D) 1000 units tablet Take 1,000 Units by mouth daily.   Yes [provider]  colchicine 0.6 MG tablet Take 0.6 mg by mouth daily.   Yes [provider]  docusate sodium (COLACE) 100 MG capsule Take 200 mg by mouth daily.   Yes [provider]  febuxostat (ULORIC) 40 MG tablet Take 40 mg by mouth daily.   Yes [provider]  KRILL OIL PO Take 2,000 mg by mouth daily.    Yes [provider]  Multiple Vitamins-Minerals (VITEYES AREDS ADVANCED PO) Take 2 capsules by mouth daily.   Yes [provider]     Vital Signs: BP (!) 153/64 (BP Location: Left Arm)   Pulse 76   Temp 97.6  F (36.4 C) (Oral)   Resp 16   SpO2 97%   Physical Exam patient awake, hard of hearing, chest clear to auscultation bilaterally.  Heart with regular rate and rhythm, positive murmur.  Left chest wall pacer intact.  Abdomen soft, positive bowel sounds, nontender, right lower quadrant urostomy intact with yellow urine.  No significant lower extremity edema.  Imaging: Dg Abdomen 1 View  Result Date: 06/13/2017 CLINICAL DATA:  Initial evaluation for retained 2 EXAM: ABDOMEN - 1 VIEW COMPARISON:  None. FINDINGS: Bowel gas pattern within normal limits. 14 mm left renal calculus noted. No other radiopaque calcification. No retained catheter or other tube within the visualized abdomen. Partially visualized hardware overlying the proximal right thigh likely lies external to the patient. Degenerate spondylolysis noted within lower lumbar spine. IMPRESSION: 1. No retained tube or catheter within the visualized abdomen. Hardware overlying the proximal right thigh favored to lie external to the patient. 2. Left renal nephrolithiasis. 3. Nonobstructive bowel gas pattern. Electronically Signed   By: Jeannine Boga M.D.   On: 06/13/2017 05:38   US Renal  Result Date: 06/13/2017 CLINICAL DATA:  Hydronephrosis due to ureteral stricture. History of bladder cancer. Right nephrostomy fell out. EXAM: RENAL / URINARY TRACT ULTRASOUND COMPLETE COMPARISON:  None. FINDINGS: Right Kidney: Length: 10.2 cm. Mild-to-moderate hydronephrosis. Suboptimal imaging of the right kidney. Left Kidney: Length: 9.7 cm. Left upper pole cyst 2.4 x 1.8 cm. Left midpole cyst 1.4 x 1.3 cm. Complex cyst left lower pole 1.6 x 1.0 cm. Shadowing stone left  lower pole 9 mm. Negative for hydronephrosis on the left. Bladder: Surgically absent bladder IMPRESSION: Mild to moderate right hydronephrosis 9 mm nonobstructing stone left lower pole. Electronically Signed   By: Franchot Gallo M.D.   On: 06/13/2017 09:59    Labs:  CBC: Recent Labs     06/13/17 1009  WBC 8.2  HGB 11.5*  HCT 34.4*  PLT 200    COAGS: Recent Labs    06/13/17 1009  INR 1.02    BMP: Recent Labs    06/13/17 1009  NA 141  K 4.6  CL 109  CO2 25  GLUCOSE 89  BUN 43*  CALCIUM 8.9  CREATININE 1.80*  GFRNONAA 31*  GFRAA 36*    LIVER FUNCTION TESTS: Recent Labs    06/13/17 1009  BILITOT 0.5  AST 25  ALT 14*  ALKPHOS 53  PROT 7.0  ALBUMIN 3.5    Assessment and Plan: Patient with history of prostate/bladder CA, status post ileal conduit with ureteral stricture requiring long-term indwelling nephroureteral catheter.  Catheter has recently become displaced and request received for replacement.  Case has been reviewed by Dr. Laurence Ferrari and discussed with Dr. Jeffie Pollock.  Renal ultrasound today reveals mild to moderate right hydronephrosis with 9 mm nonobstructing left lower pole stone as well.  Latest creatinine 1.8. At this time we feel most beneficial procedure would be a chronic right percutaneous nephrostomy to hopefully alleviate multiple patient visits to ED for nephroureteral catheter exchanges/replacements. Risks and benefits of procedure were discussed with the patient/family including, but not limited to, infection, bleeding, significant bleeding causing loss or decrease in renal function or damage to adjacent structures.   All of the patient's questions were answered, patient is agreeable to proceed.  Consent signed and in chart.  Procedure tentatively scheduled for today; would recommend admission to hospital afterwards by hospitalist service to monitor for any signs of sepsis/bleeding and reassess renal function.    Electronically Signed: D. Rowe Robert, PA-C 06/13/2017, 11:08 AM   I spent a total of 25 minutes at the the patient's bedside AND on the patient's hospital floor or unit, greater than 50% of which was counseling/coordinating care for right percutaneous nephrostomy    Patient ID: [name] [name], male   DOB:  [birth year], 84 y.o.   MRN: [id number]

## 2017-06-13 NOTE — ED Provider Notes (Signed)
[facility] DEPT Provider Note: Georgena Spurling, MD, FACEP  CSN: [id number] MRN: [id number] ARRIVAL: 06/13/17 at [place]: [place]   HISTORY OF PRESENT ILLNESS  06/13/17 5:01 AM [name] [name] is a 84 y.o. male who has had a long-standing indwelling right nephrostomy tube.  These have been placed by interventional radiology.  The tube fell out earlier this morning and he has placed a urostomy bag over the ostomy site in the right lower quadrant of his abdomen.  He is having some discomfort with it which he rates as a 5 out of 10.  He denies fever, chills, nausea, vomiting, diarrhea, chest pain or shortness of breath.   Past Medical History:  Diagnosis Date  . Cancer [facility])    bladder cancer  . Hypertension   . Renal disorder    This chart is marked for merge with the patient's usual chart.  I have reviewed his usual, more complete, chart under patient name [name] [name], which contains past medical history, surgical history and social history.   No family history on file.  Social History   Tobacco Use  . Smoking status: Not on file  Substance Use Topics  . Alcohol use: Not on file  . Drug use: Not on file    Prior to Admission medications   Medication Sig Start Date End Date Taking? Authorizing Provider  amLODipine (NORVASC) 2.5 MG tablet Take 2.5 mg by mouth daily.   Yes [provider]  aspirin EC 81 MG tablet Take 81 mg by mouth daily.   Yes [provider]  Calcium Polycarbophil (FIBER-CAPS PO) Take 2 capsules by mouth daily.   Yes [provider]  cholecalciferol (VITAMIN D) 1000 units tablet Take 1,000 Units by mouth daily.   Yes [provider]  colchicine 0.6 MG tablet Take 0.6 mg by mouth daily.   Yes [provider]  docusate sodium (COLACE) 100 MG capsule Take 200 mg by mouth daily.   Yes [provider]  febuxostat (ULORIC) 40 MG tablet Take 40 mg by  mouth daily.   Yes [provider]  KRILL OIL PO Take 2,000 mg by mouth daily.    Yes [provider]  Multiple Vitamins-Minerals (VITEYES AREDS ADVANCED PO) Take 2 capsules by mouth daily.   Yes [provider]    Allergies Patient has no known allergies.   REVIEW OF SYSTEMS  Negative except as noted here or in the History of Present Illness.   PHYSICAL EXAMINATION  Initial Vital Signs Blood pressure (!) 160/76, pulse 77, temperature 97.6 F (36.4 C), temperature source Oral, resp. rate 16, SpO2 100 %.  Examination General: Well-developed, well-nourished male in no acute distress; appearance consistent with age of record HENT: normocephalic; atraumatic; hard of hearing Eyes: pupils equal, round and reactive to light; extraocular muscles intact Neck: supple Heart: regular rate and rhythm Lungs: clear to auscultation bilaterally Abdomen: soft; nondistended; nontender; bowel sounds present; right-sided urostomy with bag draining clear yellow urine Extremities: No deformity; full range of motion; pulses normal Neurologic: Awake, alert; motor function intact in all extremities and symmetric; no facial droop Skin: Warm and dry Psychiatric: Normal mood and affect   RESULTS  Summary of this visit's results, reviewed by myself:   EKG Interpretation  Date/Time:    Ventricular Rate:    PR Interval:    QRS Duration:   QT Interval:    QTC Calculation:   R Axis:  Text Interpretation:        Laboratory Studies: No results found for this or any previous visit (from the past 24 hour(s)). Imaging Studies: Dg Abdomen 1 View  Result Date: 06/13/2017 CLINICAL DATA:  Initial evaluation for retained 2 EXAM: ABDOMEN - 1 VIEW COMPARISON:  None. FINDINGS: Bowel gas pattern within normal limits. 14 mm left renal calculus noted. No other radiopaque calcification. No retained catheter or other tube within the visualized abdomen. Partially visualized hardware  overlying the proximal right thigh likely lies external to the patient. Degenerate spondylolysis noted within lower lumbar spine. IMPRESSION: 1. No retained tube or catheter within the visualized abdomen. Hardware overlying the proximal right thigh favored to lie external to the patient. 2. Left renal nephrolithiasis. 3. Nonobstructive bowel gas pattern. Electronically Signed   By: Jeannine Boga M.D.   On: 06/13/2017 05:38    ED COURSE  Nursing notes and initial vitals signs, including pulse oximetry, reviewed.  Vitals:   06/13/17 0457  BP: (!) 160/76  Pulse: 77  Resp: 16  Temp: 97.6 F (36.4 C)  TempSrc: Oral  SpO2: 100%   Patient awaiting interventional radiology nephrostomy tube replacement.   PROCEDURES    ED DIAGNOSES     ICD-10-CM   1. Malfunction of indwelling urinary catheter ([place]) T83.011A IR Nephrostomy Tube Change    IR Nephrostomy Tube Change       Ava Tangney, MD 06/13/17 [phone]

## 2017-06-13 NOTE — H&P (Signed)
History and Physical    [name] [name] RWE:[id number] DOB: [birth year] DOA: 06/13/2017  PCP: Crist Infante, MD Patient coming from: Home  Chief Complaint: Displacement of his nephroureteral tube  HPI: [name] [name] is a 84 y.o. male with medical history significant of prostate and bladder cancer around 2008 status post ileal conduit with ureteral stricture requiring indwelling nephroureteral catheter,   Essential hypertension, gout came to the hospital for evaluation of displacement of his tube.  Patient states he has had his tube exchange multiple times but this morning when he woke up he noted his tube  fell out therefore came to the ER.  Denies any fevers, chills and any other complaints. Patient is followed by Dr. Jeffie Pollock from Urology and has  seen interventional radiology multiple times for exhange of his catheter.  Today in the ED  Per IR his US renal showed right sided hydro with nonobs stone. After discussion between IR and Urology it was decided to place right-sided nephrostomy tube and stent.  IR requested medicine admission after the procedure and watch him for a couple of days.  No complaints when I saw the patient.   Review of Systems: As per HPI otherwise 10 point review of systems negative.  Review of Systems Otherwise negative except as per HPI, including: General: Denies fever, chills, night sweats or unintended weight loss. Resp: Denies cough, wheezing, shortness of breath. Cardiac: Denies chest pain, palpitations, orthopnea, paroxysmal nocturnal dyspnea. GI: Denies abdominal pain, nausea, vomiting, diarrhea or constipation GU: Denies dysuria, frequency, hesitancy or incontinence MS: Denies muscle aches, joint pain or swelling Neuro: Denies headache, neurologic deficits (focal weakness, numbness, tingling), abnormal gait Psych: Denies anxiety, depression, SI/HI/AVH Skin: Denies new rashes or lesions ID: Denies sick contacts, exotic exposures, travel  Past Medical  History:  Diagnosis Date  . Cancer [facility])    bladder cancer  . Hypertension   . Renal disorder       has no tobacco, alcohol, and drug history on file.  No Known Allergies  Family history-history of hypertension in father  Prior to Admission medications   Medication Sig Start Date End Date Taking? Authorizing Provider  amLODipine (NORVASC) 2.5 MG tablet Take 2.5 mg by mouth daily.   Yes [provider]  aspirin EC 81 MG tablet Take 81 mg by mouth daily.   Yes [provider]  Calcium Polycarbophil (FIBER-CAPS PO) Take 2 capsules by mouth daily.   Yes [provider]  cholecalciferol (VITAMIN D) 1000 units tablet Take 1,000 Units by mouth daily.   Yes [provider]  colchicine 0.6 MG tablet Take 0.6 mg by mouth daily.   Yes [provider]  docusate sodium (COLACE) 100 MG capsule Take 200 mg by mouth daily.   Yes [provider]  febuxostat (ULORIC) 40 MG tablet Take 40 mg by mouth daily.   Yes [provider]  KRILL OIL PO Take 2,000 mg by mouth daily.    Yes [provider]  Multiple Vitamins-Minerals (VITEYES AREDS ADVANCED PO) Take 2 capsules by mouth daily.   Yes [provider]    Physical Exam: Vitals:   06/13/17 0457 06/13/17 0937  BP: (!) 160/76 (!) 153/64  Pulse: 77 76  Resp: 16 16  Temp: 97.6 F (36.4 C)   TempSrc: Oral   SpO2: 100% 97%      Constitutional: NAD, calm, comfortable, elderly frail-appearing Vitals:   06/13/17 0457 06/13/17 0937  BP: (!) 160/76 (!) 153/64  Pulse: 77 76  Resp:  16 16  Temp: 97.6 F (36.4 C)   TempSrc: Oral   SpO2: 100% 97%   Eyes: PERRL, lids and conjunctivae normal, patient is legally blind ENMT: Mucous membranes are moist. Posterior pharynx clear of any exudate or lesions.Normal dentition.  Neck: normal, supple, no masses, no thyromegaly Respiratory: clear to auscultation bilaterally, no wheezing, no crackles. Normal respiratory effort. No  accessory muscle use.  Cardiovascular: Regular rate and rhythm, no murmurs / rubs / gallops. No extremity edema. 2+ pedal pulses. No carotid bruits.  Abdomen: no tenderness, no masses palpated. No hepatosplenomegaly. Bowel sounds positive.  Right lower quadrant urostomy bag noted which is intact Musculoskeletal: no clubbing / cyanosis. No joint deformity upper and lower extremities. Good ROM, no contractures. Normal muscle tone.  Skin: no rashes, lesions, ulcers. No induration Neurologic: CN 2-12 grossly intact. Sensation intact, DTR normal. Strength 5/5 in all 4.  Psychiatric: Normal judgment and insight. Alert and oriented x 3. Normal mood.     Labs on Admission: I have personally reviewed following labs and imaging studies  CBC: Recent Labs  Lab 06/13/17 1009  WBC 8.2  NEUTROABS 5.1  HGB 11.5*  HCT 34.4*  MCV 95.3  PLT 160   Basic Metabolic Panel: Recent Labs  Lab 06/13/17 1009  NA 141  K 4.6  CL 109  CO2 25  GLUCOSE 89  BUN 43*  CREATININE 1.80*  CALCIUM 8.9   GFR: CrCl cannot be calculated (Unknown ideal weight.). Liver Function Tests: Recent Labs  Lab 06/13/17 1009  AST 25  ALT 14*  ALKPHOS 53  BILITOT 0.5  PROT 7.0  ALBUMIN 3.5   No results for input(s): LIPASE, AMYLASE in the last 168 hours. No results for input(s): AMMONIA in the last 168 hours. Coagulation Profile: Recent Labs  Lab 06/13/17 1009  INR 1.02   Cardiac Enzymes: No results for input(s): CKTOTAL, CKMB, CKMBINDEX, TROPONINI in the last 168 hours. BNP (last 3 results) No results for input(s): PROBNP in the last 8760 hours. HbA1C: No results for input(s): HGBA1C in the last 72 hours. CBG: No results for input(s): GLUCAP in the last 168 hours. Lipid Profile: No results for input(s): CHOL, HDL, LDLCALC, TRIG, CHOLHDL, LDLDIRECT in the last 72 hours. Thyroid Function Tests: No results for input(s): TSH, T4TOTAL, FREET4, T3FREE, THYROIDAB in the last 72 hours. Anemia Panel: No  results for input(s): VITAMINB12, FOLATE, FERRITIN, TIBC, IRON, RETICCTPCT in the last 72 hours. Urine analysis: No results found for: COLORURINE, APPEARANCEUR, LABSPEC, PHURINE, GLUCOSEU, HGBUR, BILIRUBINUR, KETONESUR, PROTEINUR, UROBILINOGEN, NITRITE, LEUKOCYTESUR Sepsis Labs: !!!!!!!!!!!!!!!!!!!!!!!!!!!!!!!!!!!!!!!!!!!! @LABRCNTIP (procalcitonin:4,lacticidven:4) )No results found for this or any previous visit (from the past 240 hour(s)).   Radiological Exams on Admission: Dg Abdomen 1 View  Result Date: 06/13/2017 CLINICAL DATA:  Initial evaluation for retained 2 EXAM: ABDOMEN - 1 VIEW COMPARISON:  None. FINDINGS: Bowel gas pattern within normal limits. 14 mm left renal calculus noted. No other radiopaque calcification. No retained catheter or other tube within the visualized abdomen. Partially visualized hardware overlying the proximal right thigh likely lies external to the patient. Degenerate spondylolysis noted within lower lumbar spine. IMPRESSION: 1. No retained tube or catheter within the visualized abdomen. Hardware overlying the proximal right thigh favored to lie external to the patient. 2. Left renal nephrolithiasis. 3. Nonobstructive bowel gas pattern. Electronically Signed   By: Jeannine Boga M.D.   On: 06/13/2017 05:38   US Renal  Result Date: 06/13/2017 CLINICAL DATA:  Hydronephrosis due to ureteral stricture. History of bladder cancer. Right  nephrostomy fell out. EXAM: RENAL / URINARY TRACT ULTRASOUND COMPLETE COMPARISON:  None. FINDINGS: Right Kidney: Length: 10.2 cm. Mild-to-moderate hydronephrosis. Suboptimal imaging of the right kidney. Left Kidney: Length: 9.7 cm. Left upper pole cyst 2.4 x 1.8 cm. Left midpole cyst 1.4 x 1.3 cm. Complex cyst left lower pole 1.6 x 1.0 cm. Shadowing stone left lower pole 9 mm. Negative for hydronephrosis on the left. Bladder: Surgically absent bladder IMPRESSION: Mild to moderate right hydronephrosis 9 mm nonobstructing stone left lower  pole. Electronically Signed   By: Franchot Gallo M.D.   On: 06/13/2017 09:59      Assessment/Plan Principal Problem:   Malfunction of nephrostomy tube [facility]) Active Problems:   HTN (hypertension)   Gout   Legally blind   Bladder cancer ([place])    Displacement/malfunction of nephroureteral catheter History of  prostate/bladder cancer status post ileal conduit with urethral stricture -Admit the patient to the medical service -Interventional radiology plans on placing right percutaneous nephrostomy tube today as the ultrasound revealed moderate right hydronephrosis with 9 mm nonobstructing renal stone -We will keep him n.p.o. until the procedure and then he can have 2 g diet -Provide supportive care, monitor urine output - Postop routine care per interventional radiology -Antiemetics, pain control -Gentle hydration.  Creatinine is 1.8 but I do not have his baseline.  Essential hypertension -Continue home medications Norvasc  History of gout -Not an acute flare.    DVT prophylaxis: SCDs Code Status: DO NOT RESUSCITATE Family Communication: Son and daughter at bedside Disposition Plan: Discharge once medically stable and cleared by IR Consults called: Radiology Admission status: Inpatient admission   Ankit Arsenio Loader MD [facility] Pager [phone]  If 7PM-7AM, please contact night-coverage [email or web address] Password [facility]  06/13/2017, 11:25 AM

## 2017-06-13 NOTE — ED Triage Notes (Signed)
Urostomy tubing has come out

## 2017-06-13 NOTE — ED Notes (Signed)
ED TO INPATIENT HANDOFF REPORT  Name/Age/Gender [name] [name] 84 y.o. male  Code Status    Code Status Orders  (From admission, onward)        Start     Ordered   06/13/17 1117  Do not attempt resuscitation (DNR)  Continuous    Question Answer Comment  In the event of cardiac or respiratory ARREST Do not call a "code blue"   In the event of cardiac or respiratory ARREST Do not perform Intubation, CPR, defibrillation or ACLS   In the event of cardiac or respiratory ARREST Use medication by any route, position, wound care, and other measures to relive pain and suffering. May use oxygen, suction and manual treatment of airway obstruction as needed for comfort.      06/13/17 1119    Code Status History    This patient has a current code status but no historical code status.    Advance Directive Documentation     Most Recent Value  Type of Advance Directive  Healthcare Power of Attorney  Pre-existing out of facility DNR order (yellow form or pink MOST form)  -  "MOST" Form in Place?  -      Home/SNF/Other Home  Chief Complaint colostomy bag problem   Level of Care/Admitting Diagnosis ED Disposition    ED Disposition Condition [place] [place] [100102]  Level of Care: Med-Surg [16]  Diagnosis: Malfunction of nephrostomy tube [facility]) [[id number]]  Admitting Physician: Gerlean Ren [facility] [[id number]]  Attending Physician: Gerlean Ren [facility] [[id number]]  Estimated length of stay: past midnight tomorrow  Certification:: I certify this patient will need inpatient services for at least 2 midnights  PT Class (Do Not Modify): Inpatient [101]  PT Acc Code (Do Not Modify): Private [1]       Medical History Past Medical History:  Diagnosis Date  . Cancer [facility])    bladder cancer  . Hypertension   . Renal disorder     Allergies No Known Allergies  IV Location/Drains/Wounds Patient Lines/Drains/Airways Status   Active  Line/Drains/Airways    Name:   Placement date:   Placement time:   Site:   Days:   Peripheral IV 06/13/17 Left Antecubital   06/13/17    1057    Antecubital   less than 1          Labs/Imaging Results for orders placed or performed during the hospital encounter of 06/13/17 (from the past 48 hour(s))  CBC with Differential/Platelet     Status: Abnormal   Collection Time: 06/13/17 10:09 AM  Result Value Ref Range   WBC 8.2 4.0 - 10.5 K/uL   RBC 3.61 (L) 4.22 - 5.81 MIL/uL   Hemoglobin 11.5 (L) 13.0 - 17.0 g/dL   HCT 34.4 (L) 39.0 - 52.0 %   MCV 95.3 78.0 - 100.0 fL   MCH 31.9 26.0 - 34.0 pg   MCHC 33.4 30.0 - 36.0 g/dL   RDW 13.5 11.5 - 15.5 %   Platelets 200 150 - 400 K/uL   Neutrophils Relative % 61 %   Neutro Abs 5.1 1.7 - 7.7 K/uL   Lymphocytes Relative 26 %   Lymphs Abs 2.1 0.7 - 4.0 K/uL   Monocytes Relative 6 %   Monocytes Absolute 0.5 0.1 - 1.0 K/uL   Eosinophils Relative 6 %   Eosinophils Absolute 0.5 0.0 - 0.7 K/uL   Basophils Relative 1 %   Basophils Absolute 0.0 0.0 - 0.1 K/uL  Comment: Performed at [facility], [place] [address], [place], [place] [address]  Comprehensive metabolic panel     Status: Abnormal   Collection Time: 06/13/17 10:09 AM  Result Value Ref Range   Sodium 141 135 - 145 mmol/L   Potassium 4.6 3.5 - 5.1 mmol/L   Chloride 109 101 - 111 mmol/L   CO2 25 22 - 32 mmol/L   Glucose, Bld 89 65 - 99 mg/dL   BUN 43 (H) 6 - 20 mg/dL   Creatinine, Ser 1.80 (H) 0.61 - 1.24 mg/dL   Calcium 8.9 8.9 - 10.3 mg/dL   Total Protein 7.0 6.5 - 8.1 g/dL   Albumin 3.5 3.5 - 5.0 g/dL   AST 25 15 - 41 U/L    Comment: RESULTS CONFIRMED BY MANUAL DILUTION   ALT 14 (L) 17 - 63 U/L   Alkaline Phosphatase 53 38 - 126 U/L   Total Bilirubin 0.5 0.3 - 1.2 mg/dL   GFR calc non Af Amer 31 (L) >60 mL/min   GFR calc Af Amer 36 (L) >60 mL/min    Comment: (NOTE) The eGFR has been calculated using the CKD EPI equation. This calculation has not been  validated in all clinical situations. eGFR's persistently <60 mL/min signify possible Chronic Kidney Disease.    Anion gap 7 5 - 15    Comment: Performed at [facility], [place] [address] [place], [place] [address]  Protime-INR     Status: None   Collection Time: 06/13/17 10:09 AM  Result Value Ref Range   Prothrombin Time 13.3 11.4 - 15.2 seconds   INR 1.02     Comment: Performed at [facility], [place] [address], [place], [place] [address]   Dg Abdomen 1 View  Result Date: 06/13/2017 CLINICAL DATA:  Initial evaluation for retained 2 EXAM: ABDOMEN - 1 VIEW COMPARISON:  None. FINDINGS: Bowel gas pattern within normal limits. 14 mm left renal calculus noted. No other radiopaque calcification. No retained catheter or other tube within the visualized abdomen. Partially visualized hardware overlying the proximal right thigh likely lies external to the patient. Degenerate spondylolysis noted within lower lumbar spine. IMPRESSION: 1. No retained tube or catheter within the visualized abdomen. Hardware overlying the proximal right thigh favored to lie external to the patient. 2. Left renal nephrolithiasis. 3. Nonobstructive bowel gas pattern. Electronically Signed   By: Jeannine Boga M.D.   On: 06/13/2017 05:38   US Renal  Result Date: 06/13/2017 CLINICAL DATA:  Hydronephrosis due to ureteral stricture. History of bladder cancer. Right nephrostomy fell out. EXAM: RENAL / URINARY TRACT ULTRASOUND COMPLETE COMPARISON:  None. FINDINGS: Right Kidney: Length: 10.2 cm. Mild-to-moderate hydronephrosis. Suboptimal imaging of the right kidney. Left Kidney: Length: 9.7 cm. Left upper pole cyst 2.4 x 1.8 cm. Left midpole cyst 1.4 x 1.3 cm. Complex cyst left lower pole 1.6 x 1.0 cm. Shadowing stone left lower pole 9 mm. Negative for hydronephrosis on the left. Bladder: Surgically absent bladder IMPRESSION: Mild to moderate right hydronephrosis 9 mm nonobstructing stone left  lower pole. Electronically Signed   By: Franchot Gallo M.D.   On: 06/13/2017 09:59    Pending Labs Unresulted Labs (From admission, onward)   Start     Ordered   06/14/17 9373  Basic metabolic panel  Tomorrow morning,   R     06/13/17 1119   06/14/17 0500  Protime-INR  Tomorrow morning,   R     06/13/17 1119   06/14/17  0500  CBC  Tomorrow morning,   R     06/13/17 1119   06/14/17 0500  APTT  Tomorrow morning,   R     06/13/17 1119      Vitals/Pain Today's Vitals   06/13/17 1050 06/13/17 1138 06/13/17 1200 06/13/17 1300  BP:  (!) 163/80 (!) 164/77 (!) 159/83  Pulse:  75 74 77  Resp:  '18 18 18  '$ Temp:      TempSrc:      SpO2:  96% 98% 97%  PainSc: 2        Isolation Precautions No active isolations  Medications Medications  ceFAZolin (ANCEF) IVPB 2g/100 mL premix (has no administration in time range)  febuxostat (ULORIC) tablet 40 mg (has no administration in time range)  amLODipine (NORVASC) tablet 2.5 mg (has no administration in time range)  colchicine tablet 0.6 mg (has no administration in time range)  aspirin EC tablet 81 mg (has no administration in time range)  docusate sodium (COLACE) capsule 200 mg (has no administration in time range)  Krill Oil CAPS 2,000 mg (has no administration in time range)  VITEYES AREDS ADVANCED CAPS (has no administration in time range)  polycarbophil (FIBERCON) tablet 1,250 mg (has no administration in time range)  cholecalciferol (VITAMIN D) tablet 1,000 Units (has no administration in time range)  0.9 %  sodium chloride infusion (has no administration in time range)  acetaminophen (TYLENOL) tablet 650 mg (has no administration in time range)    Or  acetaminophen (TYLENOL) suppository 650 mg (has no administration in time range)  traMADol (ULTRAM) tablet 50 mg (has no administration in time range)  senna-docusate (Senokot-S) tablet 1 tablet (has no administration in time range)  traZODone (DESYREL) tablet 25 mg (has no  administration in time range)  bisacodyl (DULCOLAX) EC tablet 5 mg (has no administration in time range)    Mobility Walks

## 2017-06-13 NOTE — ED Notes (Signed)
Per Lennette Bihari PA working with IR no PO or medication to be given until after IR. IR to be "in several hours;" per IR staff ancef will be given there.

## 2017-06-13 NOTE — ED Provider Notes (Signed)
  Physical Exam  BP (!) 153/64 (BP Location: Left Arm)   Pulse 76   Temp 97.6 F (36.4 C) (Oral)   Resp 16   SpO2 97%   Physical Exam  ED Course/Procedures     Procedures  MDM  I discussed with Daryl Allred from interventional radiology.  Patient will need a percutaneous nephrostomy tube as opposed to the lower abdominal tube that he has now.  He has discussed with Dr. Jeffie Pollock from urology also.  They believe patient would benefit from admission to internal medicine for monitoring during this time.       Davonna Belling, MD 06/13/17 1016

## 2017-06-14 ENCOUNTER — Encounter (HOSPITAL_COMMUNITY): Payer: Self-pay | Admitting: *Deleted

## 2017-06-14 DIAGNOSIS — T83192A Other mechanical complication of urinary stent, initial encounter: Secondary | ICD-10-CM | POA: Diagnosis not present

## 2017-06-14 DIAGNOSIS — N179 Acute kidney failure, unspecified: Secondary | ICD-10-CM | POA: Diagnosis not present

## 2017-06-14 DIAGNOSIS — I10 Essential (primary) hypertension: Secondary | ICD-10-CM | POA: Diagnosis not present

## 2017-06-14 DIAGNOSIS — T83098A Other mechanical complication of other indwelling urethral catheter, initial encounter: Secondary | ICD-10-CM | POA: Diagnosis not present

## 2017-06-14 LAB — PROTIME-INR
INR: 1.16
PROTHROMBIN TIME: 14.7 s (ref 11.4–15.2)

## 2017-06-14 LAB — CBC
HCT: 35.1 % — ABNORMAL LOW (ref 39.0–52.0)
HEMOGLOBIN: 11.1 g/dL — AB (ref 13.0–17.0)
MCH: 30.8 pg (ref 26.0–34.0)
MCHC: 31.6 g/dL (ref 30.0–36.0)
MCV: 97.5 fL (ref 78.0–100.0)
Platelets: 202 10*3/uL (ref 150–400)
RBC: 3.6 MIL/uL — AB (ref 4.22–5.81)
RDW: 13.9 % (ref 11.5–15.5)
WBC: 9 10*3/uL (ref 4.0–10.5)

## 2017-06-14 LAB — BASIC METABOLIC PANEL
Anion gap: 8 (ref 5–15)
BUN: 41 mg/dL — ABNORMAL HIGH (ref 6–20)
CHLORIDE: 112 mmol/L — AB (ref 101–111)
CO2: 23 mmol/L (ref 22–32)
CREATININE: 2.01 mg/dL — AB (ref 0.61–1.24)
Calcium: 8.5 mg/dL — ABNORMAL LOW (ref 8.9–10.3)
GFR calc non Af Amer: 27 mL/min — ABNORMAL LOW (ref >60)
GFR, EST AFRICAN AMERICAN: 31 mL/min — AB (ref >60)
Glucose, Bld: 88 mg/dL (ref 65–99)
POTASSIUM: 5.1 mmol/L (ref 3.5–5.1)
SODIUM: 143 mmol/L (ref 135–145)

## 2017-06-14 LAB — APTT: APTT: 29 s (ref 24–36)

## 2017-06-14 MED ORDER — SALINE SPRAY 0.65 % NA SOLN
1.0000 | NASAL | Status: DC | PRN
  Filled 2017-06-14: qty 44

## 2017-06-14 MED ORDER — SODIUM CHLORIDE 0.45 % IV BOLUS
500.0000 mL | Freq: Once | INTRAVENOUS | Status: AC
  Administered 2017-06-14: 500 mL via INTRAVENOUS

## 2017-06-14 NOTE — Progress Notes (Signed)
Referring Physician(s): Keene Breath  Supervising Physician: Markus Daft  Patient Status:  [facility] - In-pt  Chief Complaint:  Dislodged nephroureteral catheter  Subjective:  Mr [name] [name] is doing well today. He is standing up at the side of the bed. He only complained of his IV in his left AC fossa bleeding. He denies pain.  Allergies: Patient has no known allergies.  Medications: Prior to Admission medications   Medication Sig Start Date End Date Taking? Authorizing Provider  amLODipine (NORVASC) 2.5 MG tablet Take 2.5 mg by mouth daily.   Yes [provider]  aspirin EC 81 MG tablet Take 81 mg by mouth daily.   Yes [provider]  Calcium Polycarbophil (FIBER-CAPS PO) Take 2 capsules by mouth daily.   Yes [provider]  cholecalciferol (VITAMIN D) 1000 units tablet Take 1,000 Units by mouth daily.   Yes [provider]  colchicine 0.6 MG tablet Take 0.6 mg by mouth daily.   Yes [provider]  docusate sodium (COLACE) 100 MG capsule Take 200 mg by mouth daily.   Yes [provider]  febuxostat (ULORIC) 40 MG tablet Take 40 mg by mouth daily.   Yes [provider]  KRILL OIL PO Take 2,000 mg by mouth daily.    Yes [provider]  Multiple Vitamins-Minerals (VITEYES AREDS ADVANCED PO) Take 2 capsules by mouth daily.   Yes [provider]     Vital Signs: BP (!) 150/77 (BP Location: Right Arm)   Pulse 78   Temp 98.5 F (36.9 C) (Oral)   Resp 20   Ht 5\' 8"  (1.727 m)   Wt 153 lb 14.1 oz (69.8 kg)   SpO2 98%   BMI 23.40 kg/m   Physical Exam Awake and alert Standing at bedside Right flank dressing clean PCN in place Some blood tinged urine, but appears to be clearing Clear urine in urostomy bag  Imaging: Dg Abdomen 1 View  Result Date: 06/13/2017 CLINICAL DATA:  Initial evaluation for retained 2 EXAM: ABDOMEN - 1 VIEW COMPARISON:  None. FINDINGS: Bowel gas pattern within normal  limits. 14 mm left renal calculus noted. No other radiopaque calcification. No retained catheter or other tube within the visualized abdomen. Partially visualized hardware overlying the proximal right thigh likely lies external to the patient. Degenerate spondylolysis noted within lower lumbar spine. IMPRESSION: 1. No retained tube or catheter within the visualized abdomen. Hardware overlying the proximal right thigh favored to lie external to the patient. 2. Left renal nephrolithiasis. 3. Nonobstructive bowel gas pattern. Electronically Signed   By: Jeannine Boga M.D.   On: 06/13/2017 05:38   US Renal  Result Date: 06/13/2017 CLINICAL DATA:  Hydronephrosis due to ureteral stricture. History of bladder cancer. Right nephrostomy fell out. EXAM: RENAL / URINARY TRACT ULTRASOUND COMPLETE COMPARISON:  None. FINDINGS: Right Kidney: Length: 10.2 cm. Mild-to-moderate hydronephrosis. Suboptimal imaging of the right kidney. Left Kidney: Length: 9.7 cm. Left upper pole cyst 2.4 x 1.8 cm. Left midpole cyst 1.4 x 1.3 cm. Complex cyst left lower pole 1.6 x 1.0 cm. Shadowing stone left lower pole 9 mm. Negative for hydronephrosis on the left. Bladder: Surgically absent bladder IMPRESSION: Mild to moderate right hydronephrosis 9 mm nonobstructing stone left lower pole. Electronically Signed   By: Franchot Gallo M.D.   On: 06/13/2017 09:59   Ir Nephrostomy Placement Right  Result Date: 06/13/2017 INDICATION: 84 year old male with a history of bladder cancer status post cystectomy with ureteral ileal diversion. 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 The stent spontaneously became displace last night. It is not possible to regain access from the retrograde approach. Therefore, he presents for placement of a new percutaneous nephrostomy tube. EXAM: IR NEPHROSTOMY PLACEMENT RIGHT COMPARISON:  None. MEDICATIONS: 2 g Ancef;  The antibiotic was administered in an appropriate time frame prior to skin puncture. ANESTHESIA/SEDATION: Fentanyl 50 mcg IV; Versed 1 mg IV Moderate Sedation Time:  14 minutes The patient was continuously monitored during the procedure by the interventional radiology nurse under my direct supervision. CONTRAST:  32mL ISOVUE-300 IOPAMIDOL (ISOVUE-300) INJECTION 61% - administered into the collecting system(s) FLUOROSCOPY TIME:  Fluoroscopy Time: 1 minutes 54 seconds (10 mGy). COMPLICATIONS: None immediate. TECHNIQUE: The procedure, risks, benefits, and alternatives were explained to the patient. Questions regarding the procedure were encouraged and answered. The patient understands and consents to the procedure. The right flank was prepped with chlorhexidine in a sterile fashion, and a sterile drape was applied covering the operative field. A sterile gown and sterile gloves were used for the procedure. Local anesthesia was provided with 1% Lidocaine. The right flank was interrogated with ultrasound and the left kidney identified. The kidney is hydronephrotic. A suitable access site on the skin overlying the lower pole, posterior calix was identified. After local mg anesthesia was achieved, a small skin nick was made with an 11 blade scalpel. A 21 gauge Accustick needle was then advanced under direct sonographic guidance into the lower pole of the right kidney. A 0.018 inch wire was advanced under fluoroscopic guidance into the left renal collecting system. The Accustick sheath was then advanced over the wire and a 0.018 system exchanged for a 0.035 system. Gentle hand injection of contrast material confirms placement of the sheath within the renal collecting system. There is mild hydronephrosis. Persistent distal ureteral occlusion. The tract from the scan into the renal collecting system was then dilated serially to 10-French. A 10-French Cook all-purpose drain was then placed and positioned under fluoroscopic  guidance. The locking loop is well formed within the left renal pelvis. The catheter was secured to the skin with 2-0 Prolene and a sterile bandage was placed. Catheter was left to gravity bag drainage. IMPRESSION: Successful placement of a right 10 percutaneous nephrostomy tube. PLAN: 1. Return Interventional Radiology in 6 weeks for nephrostomy tube check and exchange. Family is considering whether or not to keep this as a chronic nephrostomy tube, or to undergo repeat conversion to a retrograde nephroureteral tube. Signed, Criselda Peaches, MD [facility] Radiology Electronically Signed   By: Jacqulynn Cadet M.D.   On: 06/13/2017 16:03    Labs:  CBC: Recent Labs    06/13/17 1009 06/14/17 0359  WBC 8.2 9.0  HGB 11.5* 11.1*  HCT 34.4* 35.1*  PLT 200 202    COAGS: Recent Labs    06/13/17 1009 06/14/17 0359  INR 1.02 1.16  APTT  --  29    BMP: Recent Labs    06/13/17 1009 06/14/17 0359  NA 141 143  K 4.6 5.1  CL 109 112*  CO2 25 23  GLUCOSE 89 88  BUN 43* 41*  CALCIUM 8.9 8.5*  CREATININE 1.80* 2.01*  GFRNONAA 31* 27*  GFRAA 36* 31*    LIVER FUNCTION TESTS: Recent Labs    06/13/17 1009  BILITOT 0.5  AST 25  ALT 14*  ALKPHOS 53  PROT 7.0  ALBUMIN 3.5    Assessment and Plan:  History of prostate/bladder CA, status post ileal conduit with ureteral stricture requiring long-term indwelling nephroureteral catheter.    Catheter had become displaced.  Renal ultrasound today revealed mild to moderate right hydronephrosis with 9 mm nonobstructing left lower pole stone as well.   He underwent a right percutaneous nephrostomy to hopefully alleviate multiple patient visits to ED for nephroureteral catheter exchanges/replacements  Doing well today. Urine should clear in a day or so.  Care per Urology.  Electronically Signed: Murrell Redden, PA-C 06/14/2017, 9:11 AM   I spent a total of 15 Minutes at the the  patient's bedside AND on the patient's hospital floor or unit, greater than 50% of which was counseling/coordinating care for

## 2017-06-14 NOTE — Progress Notes (Signed)
[facility] PROGRESS NOTE  Arwin Bisceglia YFV:[id number] DOB: [birth year] DOA: 06/13/2017  PCP: Crist Infante, MD  Brief History/Interval Summary: 84 y.o. male with medical history significant of prostate and bladder cancer around 2008 status post ileal conduit with ureteral stricture requiring indwelling nephroureteral catheter.  He presented to the hospital for evaluation of displacement of his tube.  Patient is followed by Dr. Jeffie Pollock from Urology and has  seen interventional radiology multiple times for exhange of his catheter.  Patient was seen by interventional radiology and underwent placement of percutaneous nephrostomy tube.  It was recommended that he be monitored in the hospital.  Reason for Visit: Mild acute renal failure  Consultants: Interventional radiology.  Phone discussion with urology.  Procedures: Percutaneous nephrostomy tube placement  Antibiotics: None  Subjective/Interval History: Patient states that he feels well.  Denies any pain around the tube site.  No dizziness or lightheadedness.  His daughter is at the bedside.  ROS: Denies any nausea or vomiting  Objective:  Vital Signs  Vitals:   06/13/17 1619 06/13/17 2247 06/14/17 0625 06/14/17 1255  BP: (!) 162/83 (!) 138/51 (!) 150/77 (!) 153/74  Pulse: 60 78 78 77  Resp: 16 20 20 20   Temp: 97.9 F (36.6 C) 98.2 F (36.8 C) 98.5 F (36.9 C) 98 F (36.7 C)  TempSrc: Oral Oral Oral Oral  SpO2: 98% 97% 98% 100%  Weight: 69.8 kg (153 lb 14.1 oz)     Height: 5\' 8"  (1.727 m)       Intake/Output Summary (Last 24 hours) at 06/14/2017 1315 Last data filed at 06/14/2017 0600 Gross per 24 hour  Intake 1232.5 ml  Output 750 ml  Net 482.5 ml   Filed Weights   06/13/17 1619  Weight: 69.8 kg (153 lb 14.1 oz)    General appearance: alert, cooperative, appears stated age and no distress Resp: clear to auscultation bilaterally Cardio: regular rate and rhythm, S1, S2 normal, no murmur, click, rub or  gallop GI: soft, non-tender; bowel sounds normal; no masses,  no organomegaly Extremities: extremities normal, atraumatic, no cyanosis or edema Neurologic: No obvious focal neurological deficits.  Lab Results:  Data Reviewed: I have personally reviewed following labs and imaging studies  CBC: Recent Labs  Lab 06/13/17 1009 06/14/17 0359  WBC 8.2 9.0  NEUTROABS 5.1  --   HGB 11.5* 11.1*  HCT 34.4* 35.1*  MCV 95.3 97.5  PLT 200 163    Basic Metabolic Panel: Recent Labs  Lab 06/13/17 1009 06/14/17 0359  NA 141 143  K 4.6 5.1  CL 109 112*  CO2 25 23  GLUCOSE 89 88  BUN 43* 41*  CREATININE 1.80* 2.01*  CALCIUM 8.9 8.5*    GFR: Estimated Creatinine Clearance: 22.2 mL/min (A) (by C-G formula based on SCr of 2.01 mg/dL (H)).  Liver Function Tests: Recent Labs  Lab 06/13/17 1009  AST 25  ALT 14*  ALKPHOS 53  BILITOT 0.5  PROT 7.0  ALBUMIN 3.5    Coagulation Profile: Recent Labs  Lab 06/13/17 1009 06/14/17 0359  INR 1.02 1.16     Radiology Studies: Dg Abdomen 1 View  Result Date: 06/13/2017 CLINICAL DATA:  Initial evaluation for retained 2 EXAM: ABDOMEN - 1 VIEW COMPARISON:  None. FINDINGS: Bowel gas pattern within normal limits. 14 mm left renal calculus noted. No other radiopaque calcification. No retained catheter or other tube within the visualized abdomen. Partially visualized hardware overlying the proximal right thigh likely lies external to the patient. Degenerate spondylolysis  noted within lower lumbar spine. IMPRESSION: 1. No retained tube or catheter within the visualized abdomen. Hardware overlying the proximal right thigh favored to lie external to the patient. 2. Left renal nephrolithiasis. 3. Nonobstructive bowel gas pattern. Electronically Signed   By: Jeannine Boga M.D.   On: 06/13/2017 05:38   US Renal  Result Date: 06/13/2017 CLINICAL DATA:  Hydronephrosis due to ureteral stricture. History of bladder cancer. Right nephrostomy fell  out. EXAM: RENAL / URINARY TRACT ULTRASOUND COMPLETE COMPARISON:  None. FINDINGS: Right Kidney: Length: 10.2 cm. Mild-to-moderate hydronephrosis. Suboptimal imaging of the right kidney. Left Kidney: Length: 9.7 cm. Left upper pole cyst 2.4 x 1.8 cm. Left midpole cyst 1.4 x 1.3 cm. Complex cyst left lower pole 1.6 x 1.0 cm. Shadowing stone left lower pole 9 mm. Negative for hydronephrosis on the left. Bladder: Surgically absent bladder IMPRESSION: Mild to moderate right hydronephrosis 9 mm nonobstructing stone left lower pole. Electronically Signed   By: Franchot Gallo M.D.   On: 06/13/2017 09:59   Ir Nephrostomy Placement Right  Result Date: 06/13/2017 INDICATION: 84 year old male with a history of bladder cancer status post cystectomy with ureteral ileal diversion. 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 The stent spontaneously became displace last night. It is not possible to regain access from the retrograde approach. Therefore, he presents for placement of a new percutaneous nephrostomy tube. EXAM: IR NEPHROSTOMY PLACEMENT RIGHT COMPARISON:  None. MEDICATIONS: 2 g Ancef; The antibiotic was administered in an appropriate time frame prior to skin puncture. ANESTHESIA/SEDATION: Fentanyl 50 mcg IV; Versed 1 mg IV Moderate Sedation Time:  14 minutes The patient was continuously monitored during the procedure by the interventional radiology nurse under my direct supervision. CONTRAST:  78mL ISOVUE-300 IOPAMIDOL (ISOVUE-300) INJECTION 61% - administered into the collecting system(s) FLUOROSCOPY TIME:  Fluoroscopy Time: 1 minutes 54 seconds (10 mGy). COMPLICATIONS: None immediate. TECHNIQUE: The procedure, risks, benefits, and alternatives were explained to the patient. Questions regarding the procedure were encouraged and answered. The patient understands and consents to the procedure. The right flank was prepped  with chlorhexidine in a sterile fashion, and a sterile drape was applied covering the operative field. A sterile gown and sterile gloves were used for the procedure. Local anesthesia was provided with 1% Lidocaine. The right flank was interrogated with ultrasound and the left kidney identified. The kidney is hydronephrotic. A suitable access site on the skin overlying the lower pole, posterior calix was identified. After local mg anesthesia was achieved, a small skin nick was made with an 11 blade scalpel. A 21 gauge Accustick needle was then advanced under direct sonographic guidance into the lower pole of the right kidney. A 0.018 inch wire was advanced under fluoroscopic guidance into the left renal collecting system. The Accustick sheath was then advanced over the wire and a 0.018 system exchanged for a 0.035 system. Gentle hand injection of contrast material confirms placement of the sheath within the renal collecting system. There is mild hydronephrosis. Persistent distal ureteral occlusion. The tract from the scan into the renal collecting system was then dilated serially to 10-French. A 10-French Cook all-purpose drain was then placed and positioned under fluoroscopic guidance. The locking loop is well formed within the left renal pelvis. The catheter was secured to the skin with 2-0 Prolene and a sterile bandage was placed. Catheter was left to gravity bag drainage. IMPRESSION: Successful placement of a right  10 percutaneous nephrostomy tube. PLAN: 1. Return Interventional Radiology in 6 weeks for nephrostomy tube check and exchange. Family is considering whether or not to keep this as a chronic nephrostomy tube, or to undergo repeat conversion to a retrograde nephroureteral tube. Signed, Criselda Peaches, MD [facility] Electronically Signed   By: Jacqulynn Cadet M.D.   On: 06/13/2017 16:03     Medications:  Scheduled: . amLODipine  2.5  mg Oral Daily  . aspirin EC  81 mg Oral Daily  . cholecalciferol  1,000 Units Oral Daily  . colchicine  0.6 mg Oral Daily  . docusate sodium  200 mg Oral Daily  . febuxostat  40 mg Oral Daily  . multivitamin  1 tablet Oral Daily  . omega-3 acid ethyl esters  1 g Oral Daily  . polycarbophil  1,250 mg Oral Daily   Continuous: . sodium chloride 100 mL/hr at 06/14/17 1208   ZHY:[id number] **OR** acetaminophen, bisacodyl, senna-docusate, sodium chloride, traMADol, traZODone  Assessment/Plan:  Principal Problem:   Malfunction of nephrostomy tube (HCC) Active Problems:   HTN (hypertension)   Gout   Legally blind   Bladder cancer ([place])    Displacement of nephroureteral catheter After discussions with urology interventional radiology placed a percutaneous nephrostomy tube.  Ultrasound did reveal moderate right hydronephrosis with a nonobstructing stone.  Discussed with Dr. Lovena Neighbours who is the urologist on call who recommends follow-up with primary urologist.  Recommends hydration today and if renal function remains stable then he could be discharged tomorrow.  Mild acute renal failure Patient likely has chronic kidney disease although we are unable to determine his baseline creatinine.  Creatinine noted to be 2 this morning.  It was 1.8 yesterday.  He will be given IV fluids.  Monitor urine output.  Recheck labs tomorrow.  Renal diet.  Essential hypertension Continue with home medications.  History of gout Stable.  Continue home medications  DVT Prophylaxis: SCDs    Code Status: DNR Family Communication: Discussed with the patient and his daughter Disposition Plan: Hydration today.  Recheck labs tomorrow.    LOS: 1 day   [facility] Pager [phone] 06/14/2017, 1:15 PM  If 7PM-7AM, please contact night-coverage at [email or web address], password [facility]

## 2017-06-15 DIAGNOSIS — I10 Essential (primary) hypertension: Secondary | ICD-10-CM | POA: Diagnosis not present

## 2017-06-15 DIAGNOSIS — T83098A Other mechanical complication of other indwelling urethral catheter, initial encounter: Secondary | ICD-10-CM | POA: Diagnosis not present

## 2017-06-15 DIAGNOSIS — N179 Acute kidney failure, unspecified: Secondary | ICD-10-CM | POA: Diagnosis not present

## 2017-06-15 LAB — CBC
HEMATOCRIT: 32.2 % — AB (ref 39.0–52.0)
Hemoglobin: 10.3 g/dL — ABNORMAL LOW (ref 13.0–17.0)
MCH: 31.4 pg (ref 26.0–34.0)
MCHC: 32 g/dL (ref 30.0–36.0)
MCV: 98.2 fL (ref 78.0–100.0)
PLATELETS: 180 10*3/uL (ref 150–400)
RBC: 3.28 MIL/uL — ABNORMAL LOW (ref 4.22–5.81)
RDW: 14 % (ref 11.5–15.5)
WBC: 7.9 10*3/uL (ref 4.0–10.5)

## 2017-06-15 LAB — BASIC METABOLIC PANEL
Anion gap: 7 (ref 5–15)
BUN: 37 mg/dL — AB (ref 6–20)
CALCIUM: 8 mg/dL — AB (ref 8.9–10.3)
CO2: 22 mmol/L (ref 22–32)
Chloride: 111 mmol/L (ref 101–111)
Creatinine, Ser: 1.9 mg/dL — ABNORMAL HIGH (ref 0.61–1.24)
GFR calc Af Amer: 33 mL/min — ABNORMAL LOW (ref >60)
GFR, EST NON AFRICAN AMERICAN: 29 mL/min — AB (ref >60)
Glucose, Bld: 101 mg/dL — ABNORMAL HIGH (ref 65–99)
POTASSIUM: 4.6 mmol/L (ref 3.5–5.1)
SODIUM: 140 mmol/L (ref 135–145)

## 2017-06-15 NOTE — Progress Notes (Signed)
Discharge instructions explained to patient's daughter, she states understaNDING. pT DISCHARGED VIA WHEELCHAIR.

## 2017-06-15 NOTE — Discharge Summary (Signed)
[facility]  Physician Discharge Summary   Patient ID: [name] [name] MRN: [id number] DOB/AGE: 12-06-1921 84 y.o.  Admit date: 06/13/2017 Discharge date: 06/15/2017  PCP: Crist Infante, MD  DISCHARGE DIAGNOSES:  Principal Problem:   Malfunction of nephrostomy tube [facility]) Active Problems:   HTN (hypertension)   Gout   Legally blind   Bladder cancer ([place])   RECOMMENDATIONS FOR OUTPATIENT FOLLOW UP: 1. Patient to follow-up with his primary urologist   DISCHARGE CONDITION: fair  Diet recommendation: As before  [facility] Weights   06/13/17 1619  Weight: 69.8 kg (153 lb 14.1 oz)    INITIAL HISTORY: 84 y.o. male with medical history significant of prostate and bladder cancer around 2008 status post ileal conduit with ureteral stricture requiring indwelling nephroureteral catheter.  He presented to the hospital for evaluation of displacement of his tube.  Patient is followed by Dr. Jeffie Pollock from Urology and has  seen interventional radiology multiple times for exhange of his catheter.  Patient was seen by interventional radiology and underwent placement of percutaneous nephrostomy tube.  It was recommended that he be monitored in the hospital.  Consultants: Interventional radiology.  Phone discussion with urology.  Procedures: Percutaneous nephrostomy tube placement  HOSPITAL COURSE:   Displacement of nephroureteral catheter After discussions with urology interventional radiology placed a percutaneous nephrostomy tube.  Ultrasound did reveal moderate right hydronephrosis with a nonobstructing stone.  Discussed with Dr. Lovena Neighbours who was the urologist on call who recommends follow-up with primary urologist.    Patient was hydrated.  Renal function is stable.  Patient has good urine output.  He feels well.  Okay for discharge home today.    Mild acute renal failure Patient likely has chronic kidney disease although we are unable to determine his baseline creatinine.    Creatine  was 1.8 at admission.  Went up to 2.0.  He was hydrated and creatinine is 1.9 today.  Back in 2015 creatinine was 2.5 (as per his other medical record).  He has good urine output.  Continue monitoring as outpatient.    Essential hypertension Continue with home medications.  History of gout Stable.  Continue home medications  Overall stable.  Okay for discharge home.    PERTINENT LABS:  The results of significant diagnostics from this hospitalization (including imaging, microbiology, ancillary and laboratory) are listed below for reference.     Labs: Basic Metabolic Panel: Recent Labs  Lab 06/13/17 1009 06/14/17 0359 06/15/17 0345  NA 141 143 140  K 4.6 5.1 4.6  CL 109 112* 111  CO2 25 23 22   GLUCOSE 89 88 101*  BUN 43* 41* 37*  CREATININE 1.80* 2.01* 1.90*  CALCIUM 8.9 8.5* 8.0*   Liver Function Tests: Recent Labs  Lab 06/13/17 1009  AST 25  ALT 14*  ALKPHOS 53  BILITOT 0.5  PROT 7.0  ALBUMIN 3.5   CBC: Recent Labs  Lab 06/13/17 1009 06/14/17 0359 06/15/17 0345  WBC 8.2 9.0 7.9  NEUTROABS 5.1  --   --   HGB 11.5* 11.1* 10.3*  HCT 34.4* 35.1* 32.2*  MCV 95.3 97.5 98.2  PLT 200 202 180    IMAGING STUDIES Dg Abdomen 1 View  Result Date: 06/13/2017 CLINICAL DATA:  Initial evaluation for retained 2 EXAM: ABDOMEN - 1 VIEW COMPARISON:  None. FINDINGS: Bowel gas pattern within normal limits. 14 mm left renal calculus noted. No other radiopaque calcification. No retained catheter or other tube within the visualized abdomen. Partially visualized hardware overlying the proximal right thigh likely  lies external to the patient. Degenerate spondylolysis noted within lower lumbar spine. IMPRESSION: 1. No retained tube or catheter within the visualized abdomen. Hardware overlying the proximal right thigh favored to lie external to the patient. 2. Left renal nephrolithiasis. 3. Nonobstructive bowel gas pattern. Electronically Signed   By: Jeannine Boga M.D.   On:  06/13/2017 05:38   US Renal  Result Date: 06/13/2017 CLINICAL DATA:  Hydronephrosis due to ureteral stricture. History of bladder cancer. Right nephrostomy fell out. EXAM: RENAL / URINARY TRACT ULTRASOUND COMPLETE COMPARISON:  None. FINDINGS: Right Kidney: Length: 10.2 cm. Mild-to-moderate hydronephrosis. Suboptimal imaging of the right kidney. Left Kidney: Length: 9.7 cm. Left upper pole cyst 2.4 x 1.8 cm. Left midpole cyst 1.4 x 1.3 cm. Complex cyst left lower pole 1.6 x 1.0 cm. Shadowing stone left lower pole 9 mm. Negative for hydronephrosis on the left. Bladder: Surgically absent bladder IMPRESSION: Mild to moderate right hydronephrosis 9 mm nonobstructing stone left lower pole. Electronically Signed   By: Franchot Gallo M.D.   On: 06/13/2017 09:59   Ir Nephrostomy Placement Right  Result Date: 06/13/2017 INDICATION: 84 year old male with a history of bladder cancer status post cystectomy with ureteral ileal diversion. 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 The stent spontaneously became displace last night. It is not possible to regain access from the retrograde approach. Therefore, he presents for placement of a new percutaneous nephrostomy tube. EXAM: IR NEPHROSTOMY PLACEMENT RIGHT COMPARISON:  None. MEDICATIONS: 2 g Ancef; The antibiotic was administered in an appropriate time frame prior to skin puncture. ANESTHESIA/SEDATION: Fentanyl 50 mcg IV; Versed 1 mg IV Moderate Sedation Time:  14 minutes The patient was continuously monitored during the procedure by the interventional radiology nurse under my direct supervision. CONTRAST:  63mL ISOVUE-300 IOPAMIDOL (ISOVUE-300) INJECTION 61% - administered into the collecting system(s) FLUOROSCOPY TIME:  Fluoroscopy Time: 1 minutes 54 seconds (10 mGy). COMPLICATIONS: None immediate. TECHNIQUE: The procedure, risks, benefits, and alternatives were explained  to the patient. Questions regarding the procedure were encouraged and answered. The patient understands and consents to the procedure. The right flank was prepped with chlorhexidine in a sterile fashion, and a sterile drape was applied covering the operative field. A sterile gown and sterile gloves were used for the procedure. Local anesthesia was provided with 1% Lidocaine. The right flank was interrogated with ultrasound and the left kidney identified. The kidney is hydronephrotic. A suitable access site on the skin overlying the lower pole, posterior calix was identified. After local mg anesthesia was achieved, a small skin nick was made with an 11 blade scalpel. A 21 gauge Accustick needle was then advanced under direct sonographic guidance into the lower pole of the right kidney. A 0.018 inch wire was advanced under fluoroscopic guidance into the left renal collecting system. The Accustick sheath was then advanced over the wire and a 0.018 system exchanged for a 0.035 system. Gentle hand injection of contrast material confirms placement of the sheath within the renal collecting system. There is mild hydronephrosis. Persistent distal ureteral occlusion. The tract from the scan into the renal collecting system was then dilated serially to 10-French. A 10-French Cook all-purpose drain was then placed and positioned under fluoroscopic guidance. The locking loop is well formed within the left renal pelvis. The catheter was secured to the skin with 2-0 Prolene and a sterile bandage was placed. Catheter was left to gravity bag  drainage. IMPRESSION: Successful placement of a right 10 percutaneous nephrostomy tube. PLAN: 1. Return Interventional Radiology in 6 weeks for nephrostomy tube check and exchange. Family is considering whether or not to keep this as a chronic nephrostomy tube, or to undergo repeat conversion to a retrograde nephroureteral tube. Signed, Criselda Peaches, MD [facility] Electronically Signed   By: Jacqulynn Cadet M.D.   On: 06/13/2017 16:03    DISCHARGE EXAMINATION: Vitals:   06/14/17 1255 06/14/17 2115 06/15/17 0504 06/15/17 1056  BP: (!) 153/74 (!) 154/78 139/70 (!) 153/78  Pulse: 77 78 82   Resp: 20 16 20    Temp: 98 F (36.7 C) 99.5 F (37.5 C) 98.4 F (36.9 C)   TempSrc: Oral Oral Oral   SpO2: 100% 97% 98%   Weight:      Height:       General appearance: alert, cooperative, appears stated age and no distress Resp: clear to auscultation bilaterally Cardio: regular rate and rhythm, S1, S2 normal, no murmur, click, rub or gallop GI: soft, non-tender; bowel sounds normal; no masses,  no organomegaly  DISPOSITION: Home with family  Discharge Instructions    Call MD for:  extreme fatigue   Complete by:  As directed    Call MD for:  persistant dizziness or light-headedness   Complete by:  As directed    Call MD for:  persistant nausea and vomiting   Complete by:  As directed    Call MD for:  severe uncontrolled pain   Complete by:  As directed    Call MD for:  temperature >100.4   Complete by:  As directed    Discharge instructions   Complete by:  As directed    Please follow up with Dr. Jeffie Pollock as scheduled.  You were cared for by a hospitalist during your hospital stay. If you have any questions about your discharge medications or the care you received while you were in the hospital after you are discharged, you can call the unit and asked to speak with the hospitalist on call if the hospitalist that took care of you is not available. Once you are discharged, your primary care physician will handle any further medical issues. Please note that NO REFILLS for any discharge medications will be authorized once you are discharged, as it is imperative that you return to your primary care physician (or establish a relationship with a primary care physician if you do not have one) for your aftercare needs so  that they can reassess your need for medications and monitor your lab values. If you do not have a primary care physician, you can call [phone] for a physician referral.   Increase activity slowly   Complete by:  As directed         Allergies as of 06/15/2017   No Known Allergies     Medication List    TAKE these medications   amLODipine 2.5 MG tablet Commonly known as:  NORVASC Take 2.5 mg by mouth daily.   aspirin EC 81 MG tablet Take 81 mg by mouth daily.   cholecalciferol 1000 units tablet Commonly known as:  VITAMIN D Take 1,000 Units by mouth daily.   colchicine 0.6 MG tablet Take 0.6 mg by mouth daily.   docusate sodium 100 MG capsule Commonly known as:  COLACE Take 200 mg by mouth daily.   febuxostat 40 MG tablet Commonly known as:  ULORIC Take 40 mg by mouth daily.  FIBER-CAPS PO Take 2 capsules by mouth daily.   KRILL OIL PO Take 2,000 mg by mouth daily.   VITEYES AREDS ADVANCED PO Take 2 capsules by mouth daily.        Follow-up Information    Irine Seal, MD Follow up.   Specialty:  Urology Contact information: [place] [address] [phone]           TOTAL DISCHARGE TIME: 45 minutes  Bonnielee Haff  [facility] Pager [phone]  06/15/2017, 1:53 PM

## 2017-06-16 ENCOUNTER — Encounter (HOSPITAL_COMMUNITY): Payer: Self-pay | Admitting: Radiology

## 2017-06-21 DIAGNOSIS — Z8551 Personal history of malignant neoplasm of bladder: Secondary | ICD-10-CM | POA: Diagnosis not present

## 2017-06-21 DIAGNOSIS — Z791 Long term (current) use of non-steroidal anti-inflammatories (NSAID): Secondary | ICD-10-CM | POA: Diagnosis not present

## 2017-06-21 DIAGNOSIS — I10 Essential (primary) hypertension: Secondary | ICD-10-CM | POA: Diagnosis not present

## 2017-06-21 DIAGNOSIS — Z809 Family history of malignant neoplasm, unspecified: Secondary | ICD-10-CM | POA: Diagnosis not present

## 2017-06-21 DIAGNOSIS — H3530 Unspecified macular degeneration: Secondary | ICD-10-CM | POA: Diagnosis not present

## 2017-06-21 DIAGNOSIS — Z950 Presence of cardiac pacemaker: Secondary | ICD-10-CM | POA: Diagnosis not present

## 2017-06-21 DIAGNOSIS — M109 Gout, unspecified: Secondary | ICD-10-CM | POA: Diagnosis not present

## 2017-06-21 DIAGNOSIS — Z8546 Personal history of malignant neoplasm of prostate: Secondary | ICD-10-CM | POA: Diagnosis not present

## 2017-06-21 DIAGNOSIS — Z936 Other artificial openings of urinary tract status: Secondary | ICD-10-CM | POA: Diagnosis not present

## 2017-06-21 DIAGNOSIS — K5900 Constipation, unspecified: Secondary | ICD-10-CM | POA: Diagnosis not present

## 2017-06-25 DIAGNOSIS — N202 Calculus of kidney with calculus of ureter: Secondary | ICD-10-CM | POA: Diagnosis not present

## 2017-06-25 DIAGNOSIS — R109 Unspecified abdominal pain: Secondary | ICD-10-CM | POA: Diagnosis not present

## 2017-07-02 ENCOUNTER — Other Ambulatory Visit (HOSPITAL_COMMUNITY): Payer: Medicare HMO

## 2017-07-03 ENCOUNTER — Other Ambulatory Visit (HOSPITAL_COMMUNITY): Payer: Medicare HMO

## 2017-07-09 IMAGING — XA IR BILIARY CATHETER EXCHANGE
1 series · 4 of 4 positions shown · non-contrast
Comparison: Multiple previous fluoroscopic guided nephrostomy
catheter exchanges, most recently on 08/17/2015

INDICATION: Chronic indwelling retrograde nephro ureteral stent via right lower
quadrant ileostomy. Routine exchange.

EXAM:
FLUOROSCOPIC GUIDED RIGHT SIDED NEPHROSTOMY CATHETER EXCHANGE
TECHNIQUE: Informed written consent was obtained from the patient after a
discussion of the risks, benefits and alternatives to treatment.
Questions regarding the procedure were encouraged and answered. A
timeout was performed prior to the initiation of the procedure.

[Series 300: tube placements · 4 of 4 slices shown]
[im 1/4]
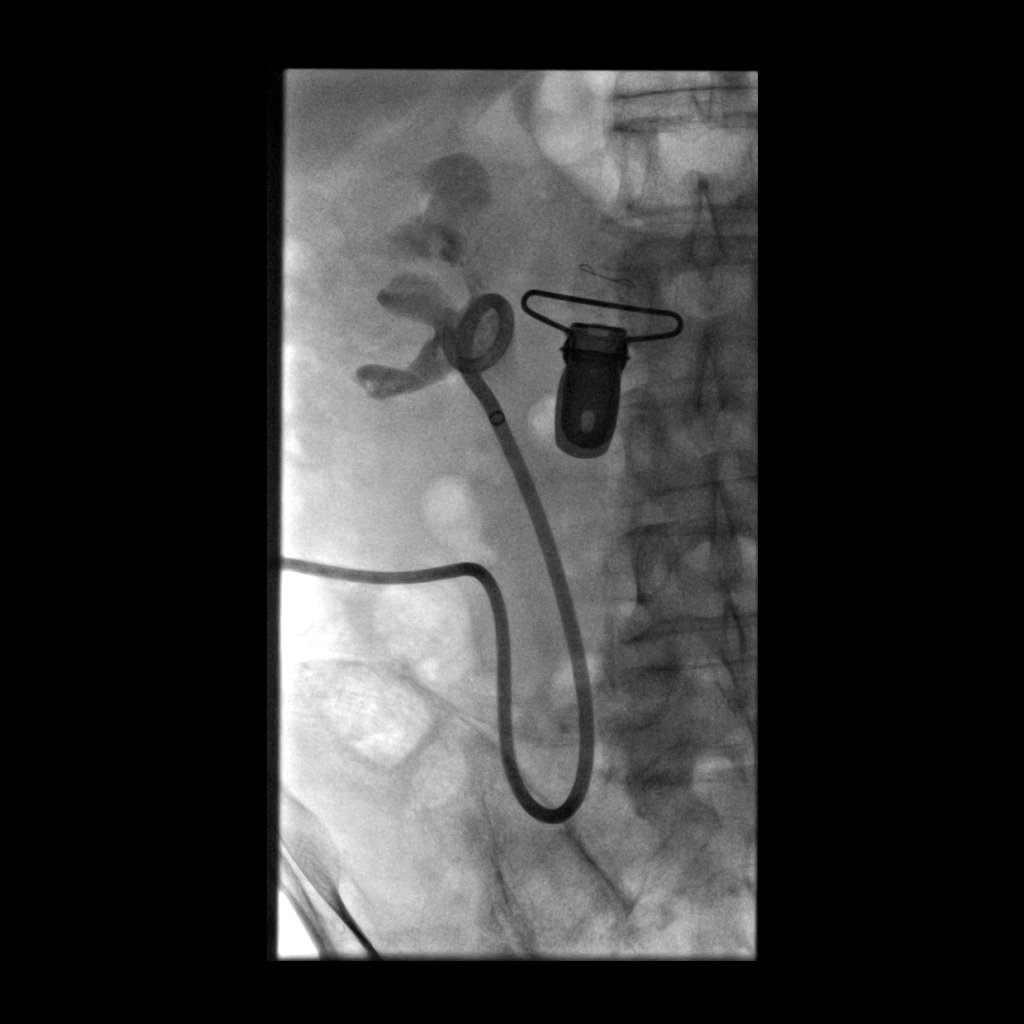
[im 2/4]
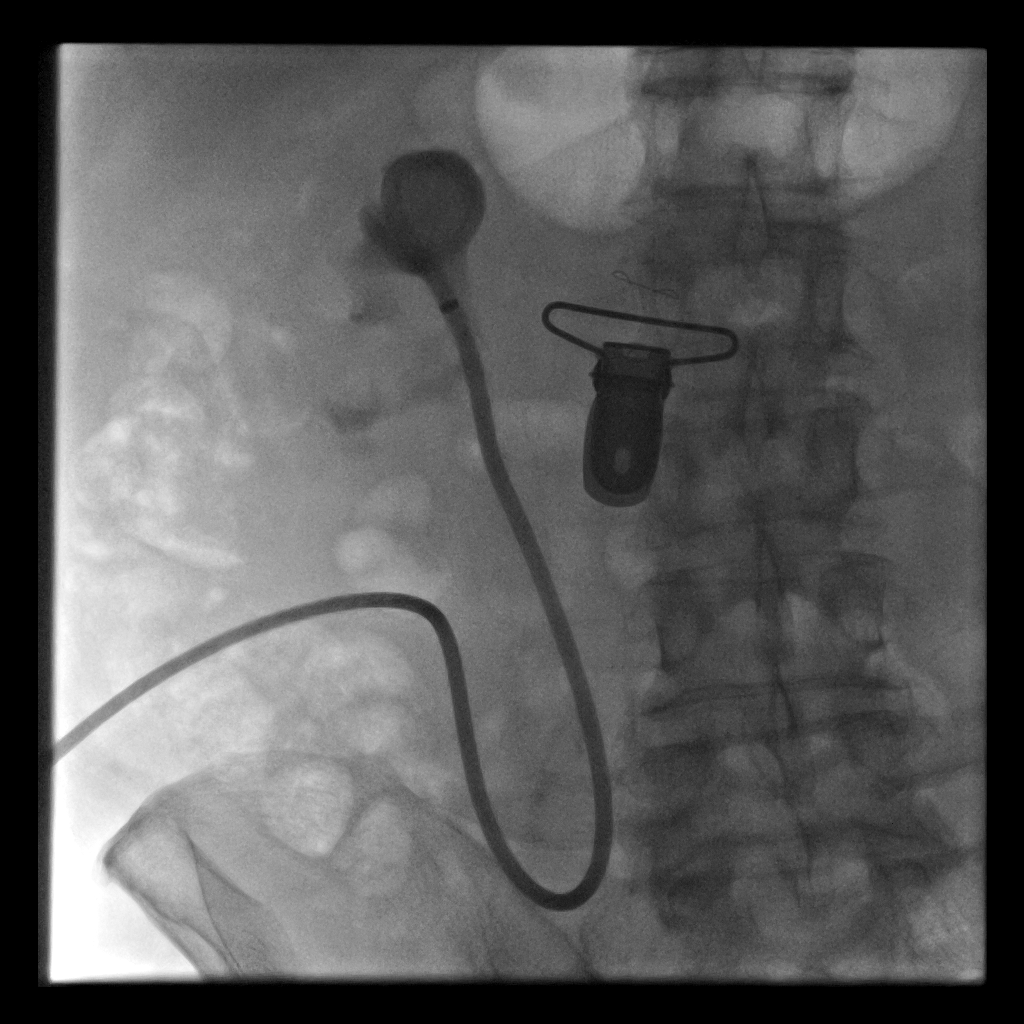
[im 3/4]
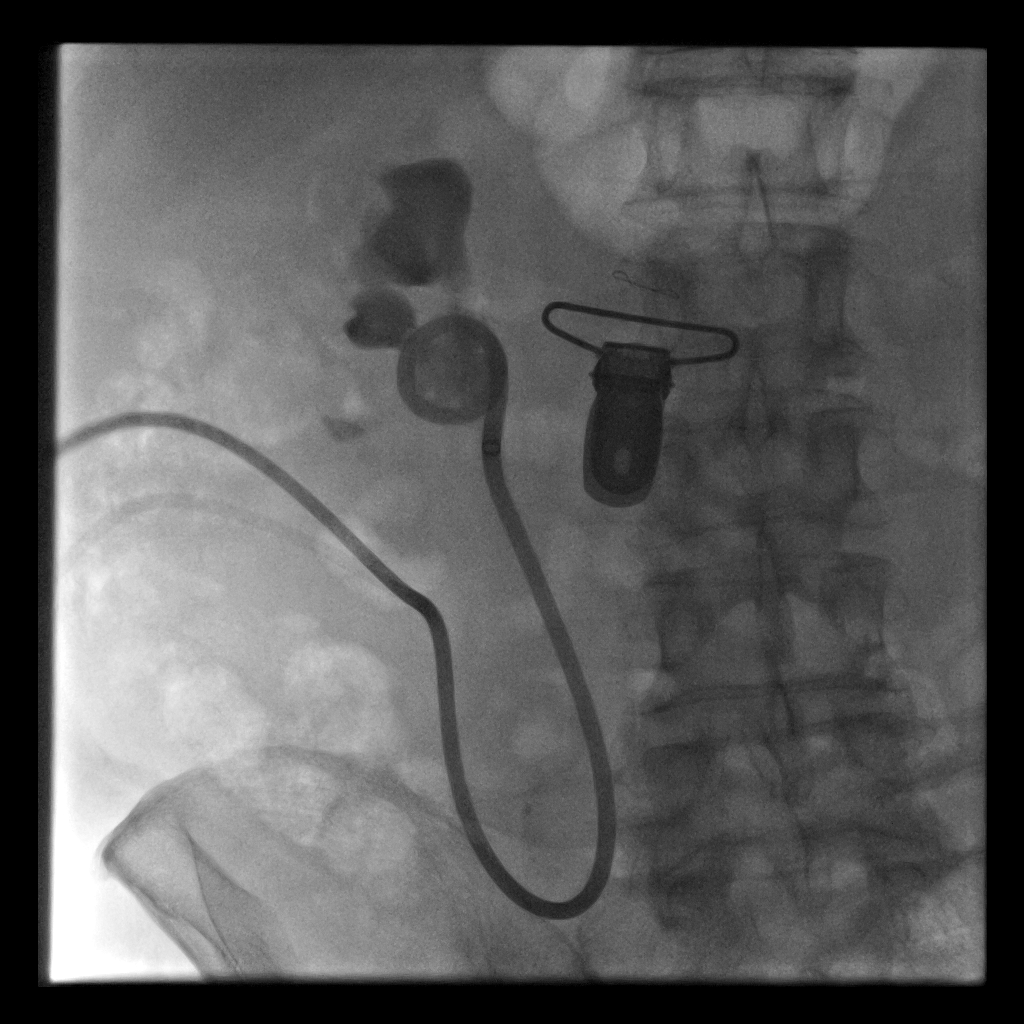
[im 4/4]
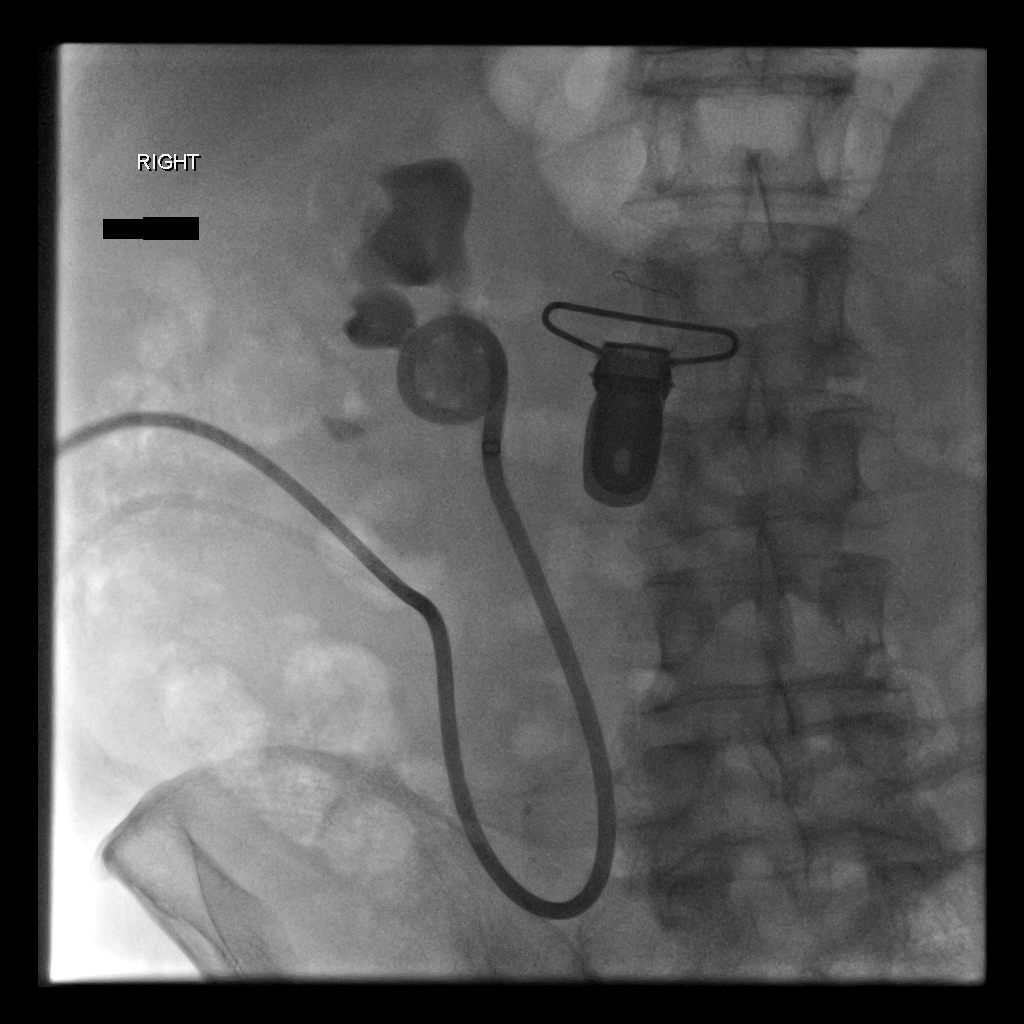

[4 of 4 positions shown; findings below may reference images not displayed]

CONTRAST:  10 mL Tsovue-ODD administered into the collecting system

FLUOROSCOPY TIME:  2 minutes 18 seconds (47 mGy)

COMPLICATIONS:
None immediate.
External portion of the existing retrograde nephro ureteral catheter
as well as the surrounding skin were prepped and draped in the usual
sterile fashion. A sterile drape was applied covering the operative
field. Maximum barrier sterile technique with sterile gowns and
gloves were used for the procedure. A timeout was performed prior to
the initiation of the procedure.

A pre procedural spot fluoroscopic image was obtained after contrast
was injected via the existing nephrostomy catheter demonstrating
appropriate positioning within the renal pelvis. The existing
nephrostomy catheter was cut and cannulated with an Amplatz wire
which was coiled within the renal pelvis. Under intermittent
fluoroscopic guidance, the existing nephro ureteral catheter was
exchanged for a new 12 French 45 cm all-purpose drainage catheter.
Contrast injection confirmed appropriate positioning within the
renal pelvis and a post exchange fluoroscopic image was obtained.
The patient tolerated the procedure well without immediate
postprocedural complication.
FINDINGS: The existing nephro ureteral catheter is appropriately positioned
and functioning.

After successful fluoroscopic guided exchange, the new nephro
ureteral catheter is coiled and locked within the right renal
pelvis.
IMPRESSION: Successful fluoroscopic guided exchange of right sided 10.2 French,
45 cm nephro ureteral catheter via right lower quadrant ileostomy.

## 2017-07-10 DIAGNOSIS — C679 Malignant neoplasm of bladder, unspecified: Secondary | ICD-10-CM | POA: Diagnosis not present

## 2017-07-10 DIAGNOSIS — Z936 Other artificial openings of urinary tract status: Secondary | ICD-10-CM | POA: Diagnosis not present

## 2017-07-25 ENCOUNTER — Encounter (HOSPITAL_COMMUNITY): Payer: Self-pay | Admitting: Interventional Radiology

## 2017-07-25 ENCOUNTER — Ambulatory Visit (HOSPITAL_COMMUNITY)
Admission: RE | Admit: 2017-07-25 | Discharge: 2017-07-25 | Disposition: A | Discharge status: Home / Self Care | Payer: Medicare HMO | Source: Ambulatory Visit | Attending: Interventional Radiology | Admitting: Interventional Radiology

## 2017-07-25 ENCOUNTER — Other Ambulatory Visit (HOSPITAL_COMMUNITY): Payer: Self-pay | Admitting: Interventional Radiology

## 2017-07-25 DIAGNOSIS — Z436 Encounter for attention to other artificial openings of urinary tract: Secondary | ICD-10-CM | POA: Insufficient documentation

## 2017-07-25 DIAGNOSIS — N1330 Unspecified hydronephrosis: Secondary | ICD-10-CM | POA: Insufficient documentation

## 2017-07-25 DIAGNOSIS — N135 Crossing vessel and stricture of ureter without hydronephrosis: Secondary | ICD-10-CM

## 2017-07-25 HISTORY — PX: IR NEPHROSTOMY EXCHANGE RIGHT: IMG6070

## 2017-07-25 MED ORDER — IOPAMIDOL (ISOVUE-300) INJECTION 61%
50.0000 mL | Freq: Once | INTRAVENOUS | Status: AC | PRN
  Administered 2017-07-25: 10 mL via INTRAVENOUS

## 2017-07-25 MED ORDER — LIDOCAINE HCL 1 % IJ SOLN
INTRAMUSCULAR | Status: DC | PRN
  Administered 2017-07-25: 5 mL

## 2017-07-25 MED ORDER — IOPAMIDOL (ISOVUE-300) INJECTION 61%
INTRAVENOUS | Status: AC
  Administered 2017-07-25: 10 mL via INTRAVENOUS
  Filled 2017-07-25: qty 50

## 2017-07-25 MED ORDER — LIDOCAINE HCL 1 % IJ SOLN
INTRAMUSCULAR | Status: AC
  Filled 2017-07-25: qty 20

## 2017-07-25 NOTE — Procedures (Signed)
Pre Procedure Dx: Hydronephrosis Post Procedure Dx: Same  Successful R PCN exchange.   Note, PCN has been converted from an APD to a Dawons Mueller drain given small size of the right renal collecting system.   EBL: None   No immediate complications.   Ronny Bacon, MD Pager #: [phone]

## 2017-08-04 ENCOUNTER — Encounter (INDEPENDENT_AMBULATORY_CARE_PROVIDER_SITE_OTHER): Payer: Medicare HMO | Admitting: Ophthalmology

## 2017-08-04 DIAGNOSIS — I10 Essential (primary) hypertension: Secondary | ICD-10-CM | POA: Diagnosis not present

## 2017-08-04 DIAGNOSIS — H43813 Vitreous degeneration, bilateral: Secondary | ICD-10-CM

## 2017-08-04 DIAGNOSIS — H353221 Exudative age-related macular degeneration, left eye, with active choroidal neovascularization: Secondary | ICD-10-CM | POA: Diagnosis not present

## 2017-08-04 DIAGNOSIS — H353114 Nonexudative age-related macular degeneration, right eye, advanced atrophic with subfoveal involvement: Secondary | ICD-10-CM

## 2017-08-04 DIAGNOSIS — H35033 Hypertensive retinopathy, bilateral: Secondary | ICD-10-CM

## 2017-08-13 IMAGING — XA IR BILIARY CATHETER EXCHANGE
1 series · 3 of 3 positions shown · non-contrast
Comparison: Most recent prior exchange 09/21/2015

INDICATION: [AGE] male with a chronic indwelling retrograde nephro
ureteral stent. He presents for routine exchange.

EXAM:
PERC TUBE CHG W/CM

[Series 300: tube placements · 3 of 3 slices shown]
[im 1/3]
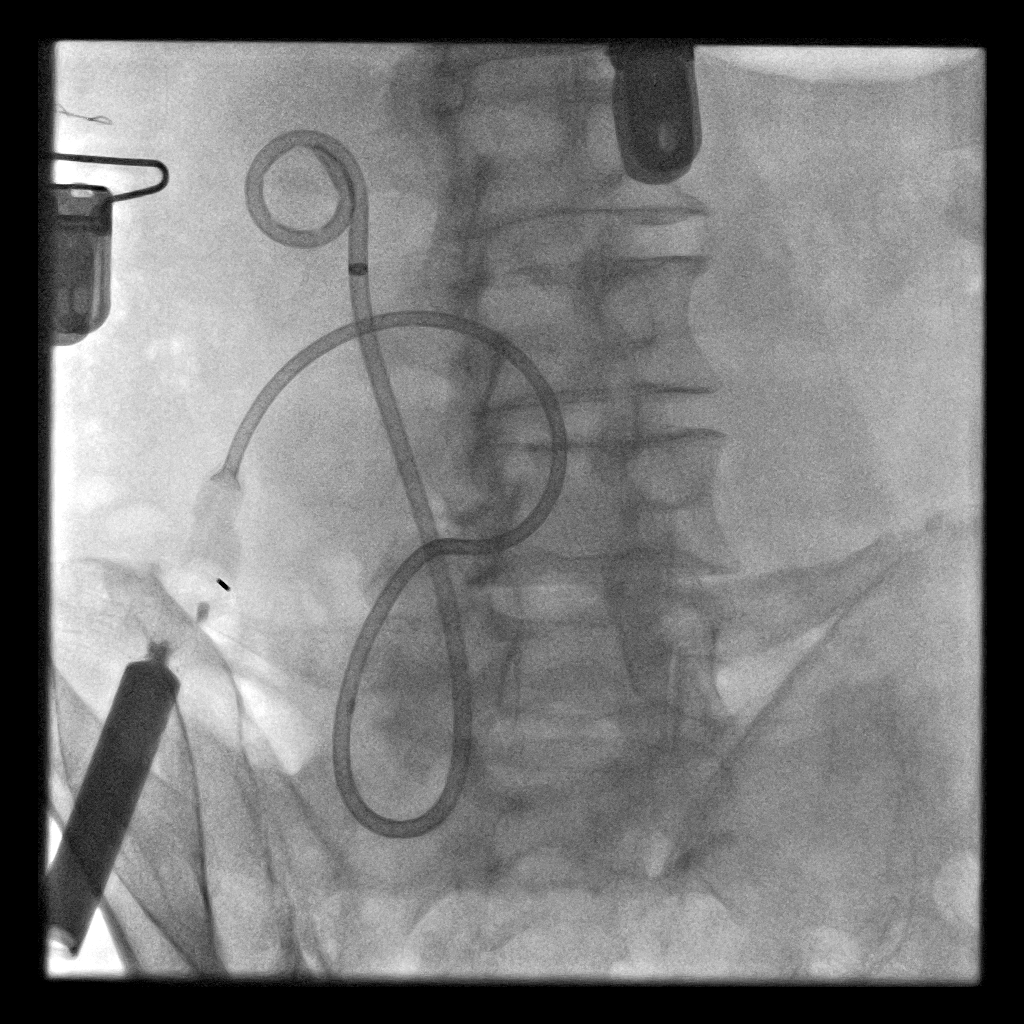
[im 2/3]
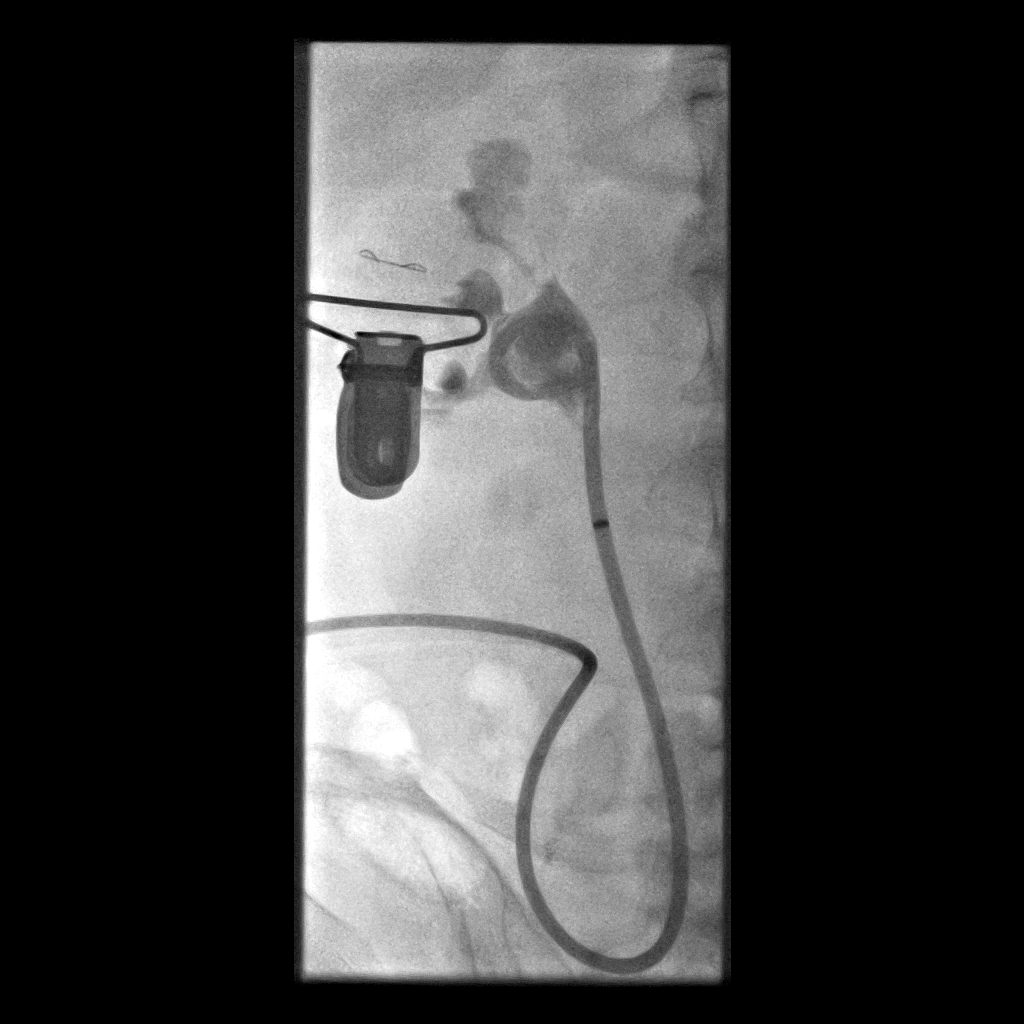
[im 3/3]
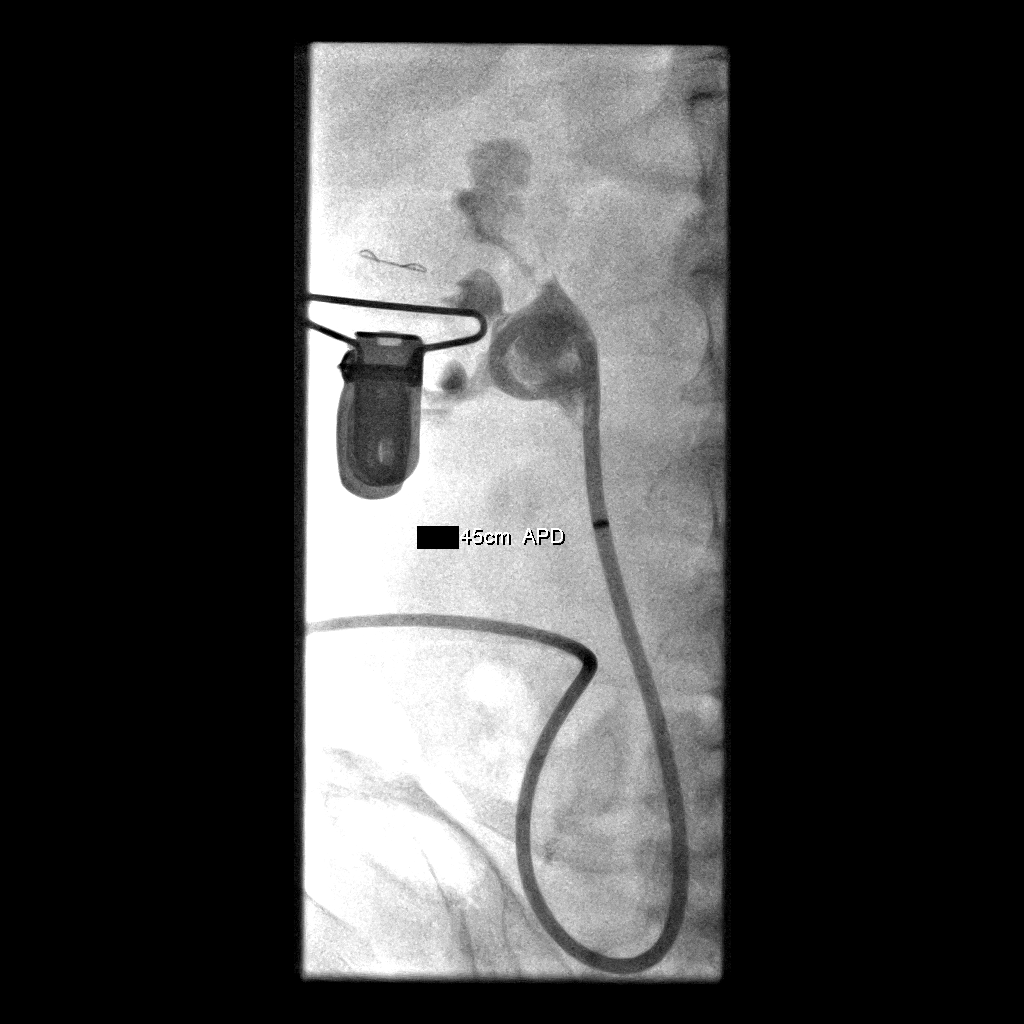

[3 of 3 positions shown; findings below may reference images not displayed]

MEDICATIONS:
None

ANESTHESIA/SEDATION:
None

CONTRAST:  10mL NHLLRO-FBB IOPAMIDOL (NHLLRO-FBB) INJECTION 61% -
administered into the collecting system(s)

FLUOROSCOPY TIME:  Fluoroscopy Time: 4 minutes 54 seconds (64 mGy).

COMPLICATIONS:
None immediate.

PROCEDURE:
Informed written consent was obtained from the patient after a
thorough discussion of the procedural risks, benefits and
alternatives. All questions were addressed. Maximal Sterile Barrier
Technique was utilized including caps, mask, sterile gowns, sterile
gloves, sterile drape, hand hygiene and skin antiseptic. A timeout
was performed prior to the initiation of the procedure.

A gentle hand injection of contrast material was performed
confirming positioning of the nephro ureteral stent in the renal
collecting system. There is no evidence of hydronephrosis. The
catheter was cut and removed over a stiff Amplatz wire. A new 10
French 45 cm all-purpose drainage catheter was then advanced over
the wire and formed with the locking loop in the renal pelvis. Tube
position was confirmed by fluoroscopy and contrast injection.

Filling defects in the renal pelvis and upper pole infundibulum
likely represent a small amount of thrombus secondary to tube
manipulation.
IMPRESSION: Successful exchange of a retrograde nephro ureteral stent via right
lower quadrant ileostomy.

## 2017-09-04 ENCOUNTER — Encounter (HOSPITAL_COMMUNITY): Payer: Self-pay | Admitting: Interventional Radiology

## 2017-09-04 ENCOUNTER — Ambulatory Visit (HOSPITAL_COMMUNITY)
Admission: RE | Admit: 2017-09-04 | Discharge: 2017-09-04 | Disposition: A | Discharge status: Home / Self Care | Payer: Medicare HMO | Source: Ambulatory Visit | Attending: Interventional Radiology | Admitting: Interventional Radiology

## 2017-09-04 ENCOUNTER — Other Ambulatory Visit (HOSPITAL_COMMUNITY): Payer: Self-pay | Admitting: Interventional Radiology

## 2017-09-04 DIAGNOSIS — N135 Crossing vessel and stricture of ureter without hydronephrosis: Secondary | ICD-10-CM

## 2017-09-04 DIAGNOSIS — Z436 Encounter for attention to other artificial openings of urinary tract: Secondary | ICD-10-CM | POA: Diagnosis not present

## 2017-09-04 DIAGNOSIS — C679 Malignant neoplasm of bladder, unspecified: Secondary | ICD-10-CM | POA: Diagnosis not present

## 2017-09-04 HISTORY — PX: IR NEPHROSTOMY TUBE CHANGE: IMG1442

## 2017-09-04 MED ORDER — IOPAMIDOL (ISOVUE-300) INJECTION 61%
INTRAVENOUS | Status: AC
  Administered 2017-09-04: 10 mL
  Filled 2017-09-04: qty 50

## 2017-09-04 MED ORDER — IOPAMIDOL (ISOVUE-300) INJECTION 61%
50.0000 mL | Freq: Once | INTRAVENOUS | Status: AC | PRN
  Administered 2017-09-04: 10 mL

## 2017-09-04 MED ORDER — LIDOCAINE HCL 1 % IJ SOLN
INTRAMUSCULAR | Status: AC
  Filled 2017-09-04: qty 20

## 2017-09-04 MED ORDER — LIDOCAINE HCL 1 % IJ SOLN
INTRAMUSCULAR | Status: DC | PRN
  Administered 2017-09-04: 5 mL

## 2017-09-10 DIAGNOSIS — C679 Malignant neoplasm of bladder, unspecified: Secondary | ICD-10-CM | POA: Diagnosis not present

## 2017-09-10 DIAGNOSIS — Z936 Other artificial openings of urinary tract status: Secondary | ICD-10-CM | POA: Diagnosis not present

## 2017-09-24 IMAGING — XA IR BILIARY CATHETER EXCHANGE
1 series · 5 of 5 positions shown · non-contrast
Comparison: 10/25/2005

INDICATION: Ureteral stricture, chronic indwelling retrograde nephro ureteral
catheter through ileal conduit. Routine exchange.

EXAM:
PERC TUBE CHG W/CM

[Series 300: tube placements · 5 of 5 slices shown]
[im 1/5]
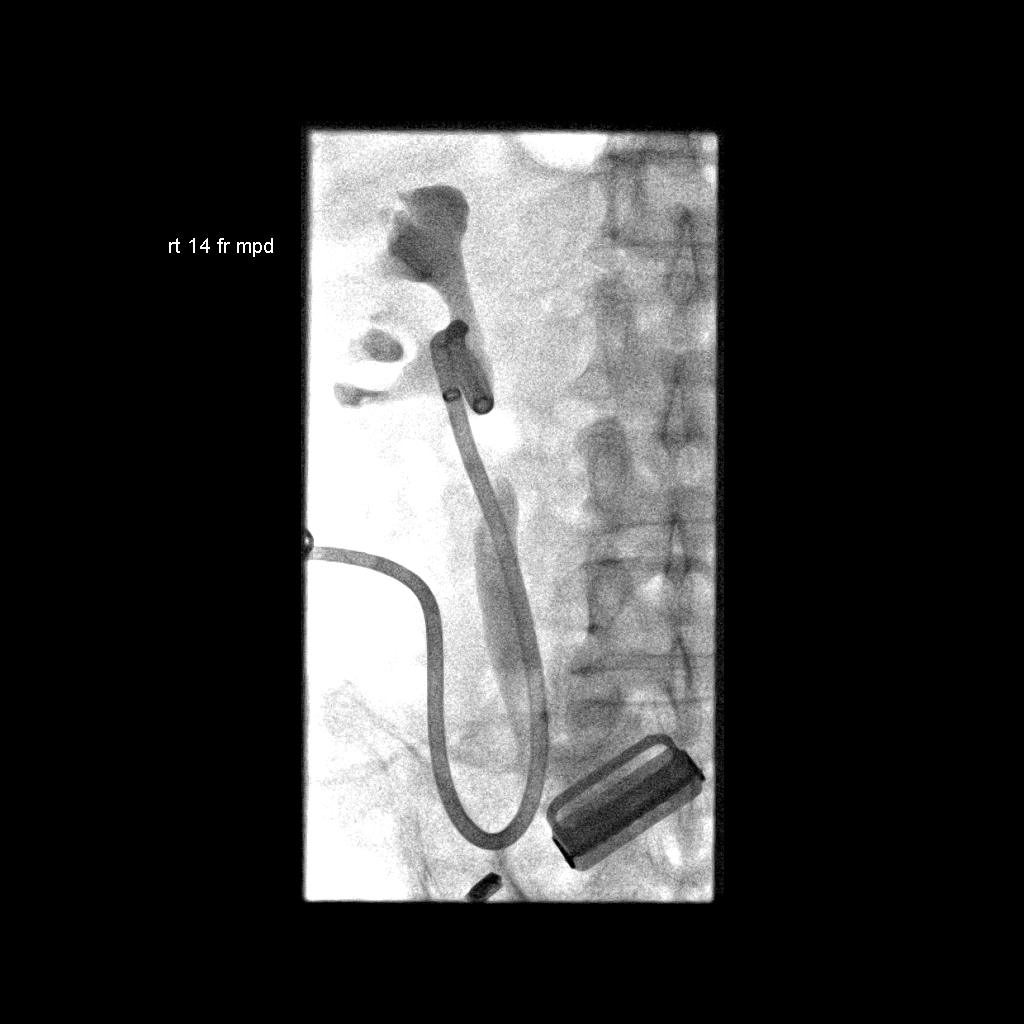
[im 2/5]
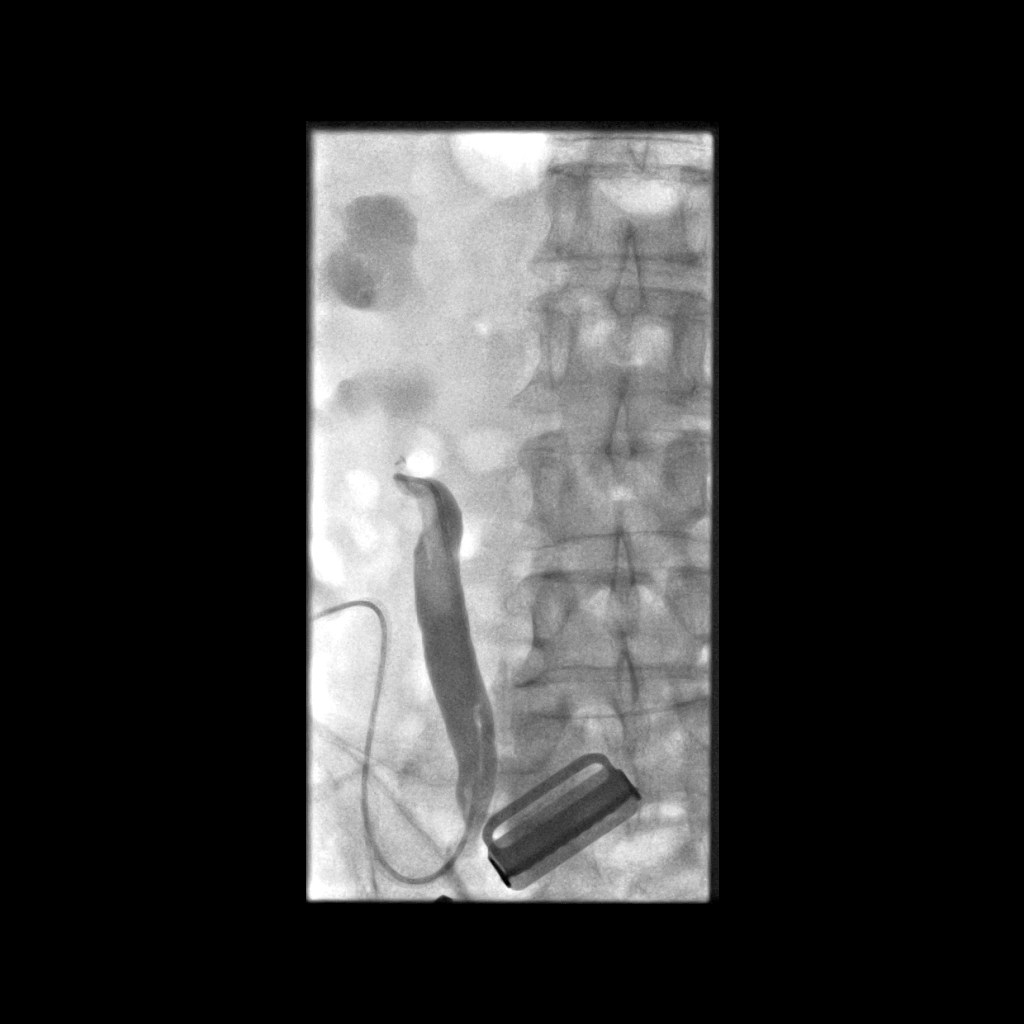
[im 3/5]
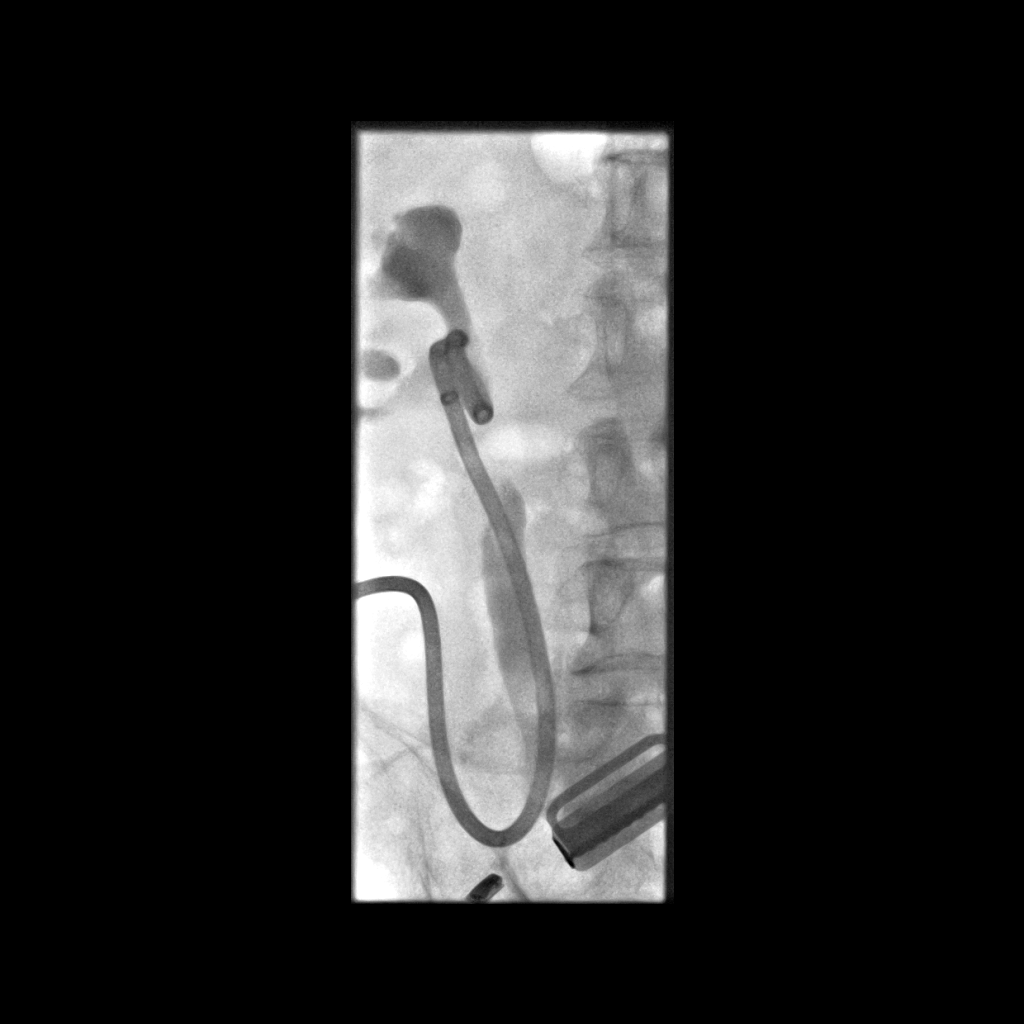
[im 4/5]
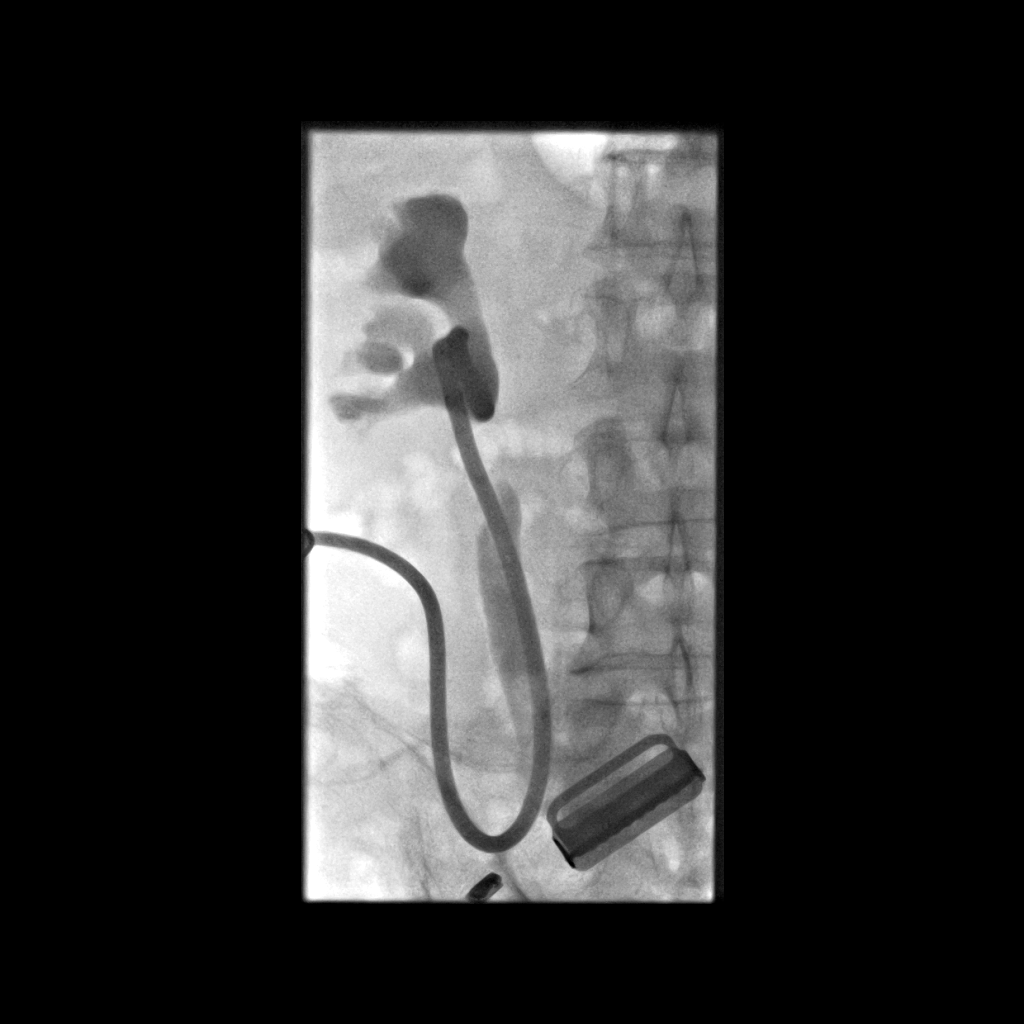
[im 5/5]
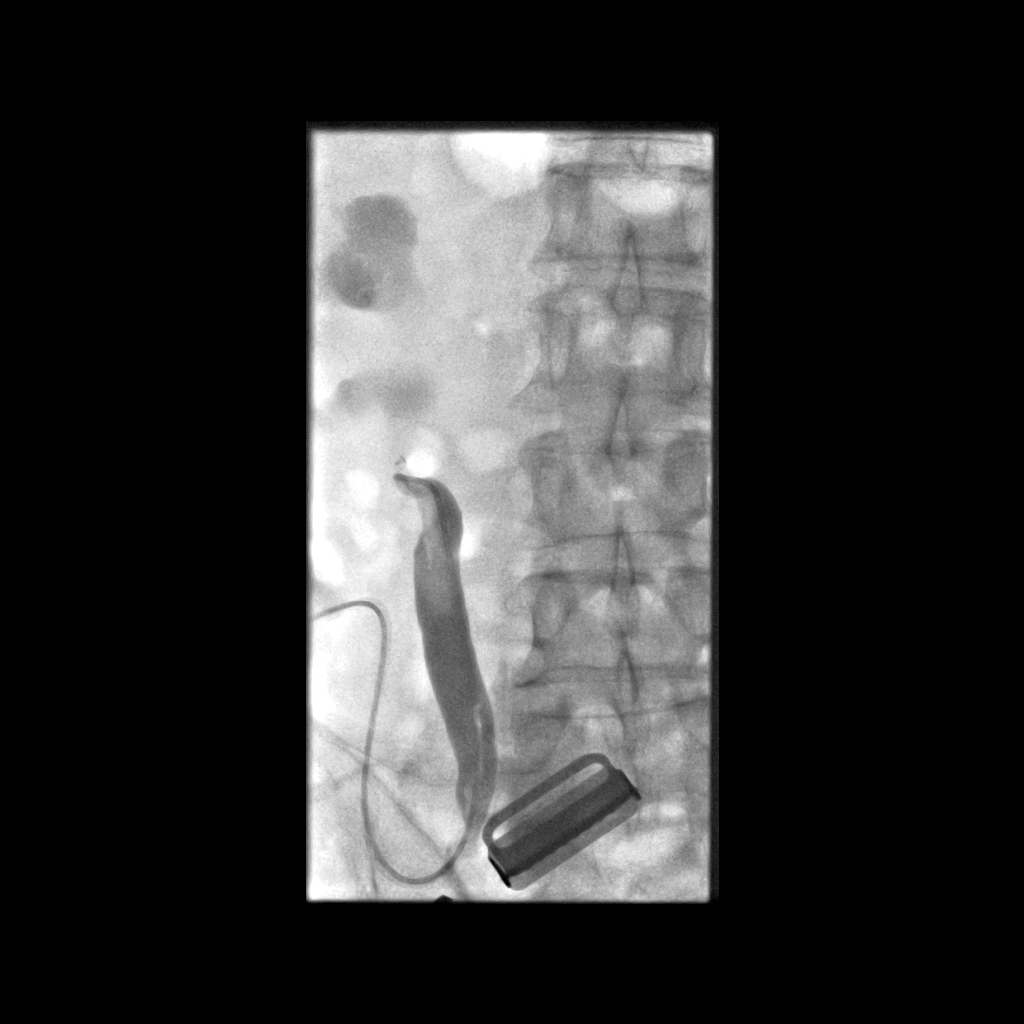

[5 of 5 positions shown; findings below may reference images not displayed]

MEDICATIONS:
None.

ANESTHESIA/SEDATION:
Moderate Sedation Time:  None.

CONTRAST:  25mL [id number]-O77 IOPAMIDOL (XZU6XT-O77) INJECTION 61% -
administered into the collecting system(s)

FLUOROSCOPY TIME:  Fluoroscopy Time: 3 minutes 36 seconds (46 mGy).

COMPLICATIONS:
None immediate.

PROCEDURE:
Informed written consent was obtained from the patient after a
thorough discussion of the procedural risks, benefits and
alternatives. All questions were addressed. Maximal Sterile Barrier
Technique was utilized including caps, mask, sterile gowns, sterile
gloves, sterile drape, hand hygiene and skin antiseptic. A timeout
was performed prior to the initiation of the procedure.

Under sterile conditions, the existing 12 French nephro ureteral
catheter was injected. This confirms tubal occlusion related [REDACTED]ization of the urine. Catheter was retracted and cut.
Eventually the catheter was removed over a Amplatz guidewire.
Amplatz guidewire and a Kumpe catheter were utilized to manipulate
the access back into the renal pelvis. Contrast injection confirms
position. Amplatz guidewire reinserted. Over the guidewire, the tube
was upsized to a 14 French. Retention loop formed in the renal
pelvis. Contrast injection confirms patency. No immediate
complication.
IMPRESSION: Difficult but successful fluoroscopic exchange and upsize of the
right retrograde nephroureteral catheter through the ileal conduit.
(14 French)

## 2017-09-29 ENCOUNTER — Telehealth (HOSPITAL_COMMUNITY): Payer: Self-pay | Admitting: Radiology

## 2017-09-29 NOTE — Telephone Encounter (Signed)
Returned patient's daughter's call asking about a difference in copays for 05/28/17 visit ($35) and 07/25/17 visit ($215).  I explanied the difference in the CPT codes for the 2 visits and gave her the cost information for each of the codes.  I shared Patient Accounting's recommendation to contact the insurance company for an explanation as to how the codes are billed.

## 2017-10-13 ENCOUNTER — Other Ambulatory Visit: Payer: Self-pay | Admitting: Urology

## 2017-10-13 ENCOUNTER — Ambulatory Visit (HOSPITAL_COMMUNITY)
Admission: RE | Admit: 2017-10-13 | Discharge: 2017-10-13 | Disposition: A | Discharge status: Home / Self Care | Payer: Medicare HMO | Source: Ambulatory Visit | Attending: Interventional Radiology | Admitting: Interventional Radiology

## 2017-10-13 ENCOUNTER — Encounter (HOSPITAL_COMMUNITY): Payer: Self-pay | Admitting: Interventional Radiology

## 2017-10-13 ENCOUNTER — Other Ambulatory Visit (HOSPITAL_COMMUNITY): Payer: Self-pay | Admitting: Interventional Radiology

## 2017-10-13 DIAGNOSIS — N135 Crossing vessel and stricture of ureter without hydronephrosis: Secondary | ICD-10-CM | POA: Insufficient documentation

## 2017-10-13 DIAGNOSIS — Z436 Encounter for attention to other artificial openings of urinary tract: Secondary | ICD-10-CM | POA: Diagnosis present

## 2017-10-13 HISTORY — PX: IR NEPHROSTOMY EXCHANGE RIGHT: IMG6070

## 2017-10-13 MED ORDER — IOPAMIDOL (ISOVUE-300) INJECTION 61%
50.0000 mL | Freq: Once | INTRAVENOUS | Status: AC | PRN
  Administered 2017-10-13: 10 mL

## 2017-10-13 MED ORDER — LIDOCAINE HCL 1 % IJ SOLN
INTRAMUSCULAR | Status: DC | PRN
  Administered 2017-10-13: 10 mL

## 2017-10-13 MED ORDER — LIDOCAINE HCL 1 % IJ SOLN
INTRAMUSCULAR | Status: AC
  Filled 2017-10-13: qty 20

## 2017-10-13 MED ORDER — IOPAMIDOL (ISOVUE-300) INJECTION 61%
INTRAVENOUS | Status: AC
  Administered 2017-10-13: 10 mL
  Filled 2017-10-13: qty 50

## 2017-10-13 NOTE — Procedures (Signed)
R PCN exchange 10 Fr Dawson-Muller EBL 0 Comp 0

## 2017-10-16 ENCOUNTER — Other Ambulatory Visit (HOSPITAL_COMMUNITY): Payer: Medicare HMO

## 2017-10-29 IMAGING — XA IR BILIARY CATHETER EXCHANGE
1 series · 2 of 2 positions shown · non-contrast
Comparison: None.

INDICATION: Prostate and bladder cancer, status post ileal conduit. Chronic
indwelling right nephro ureteral catheter.

EXAM:
PERC TUBE CHG W/CM

[Series 300: tube placements · 2 of 2 slices shown]
[im 1/2]
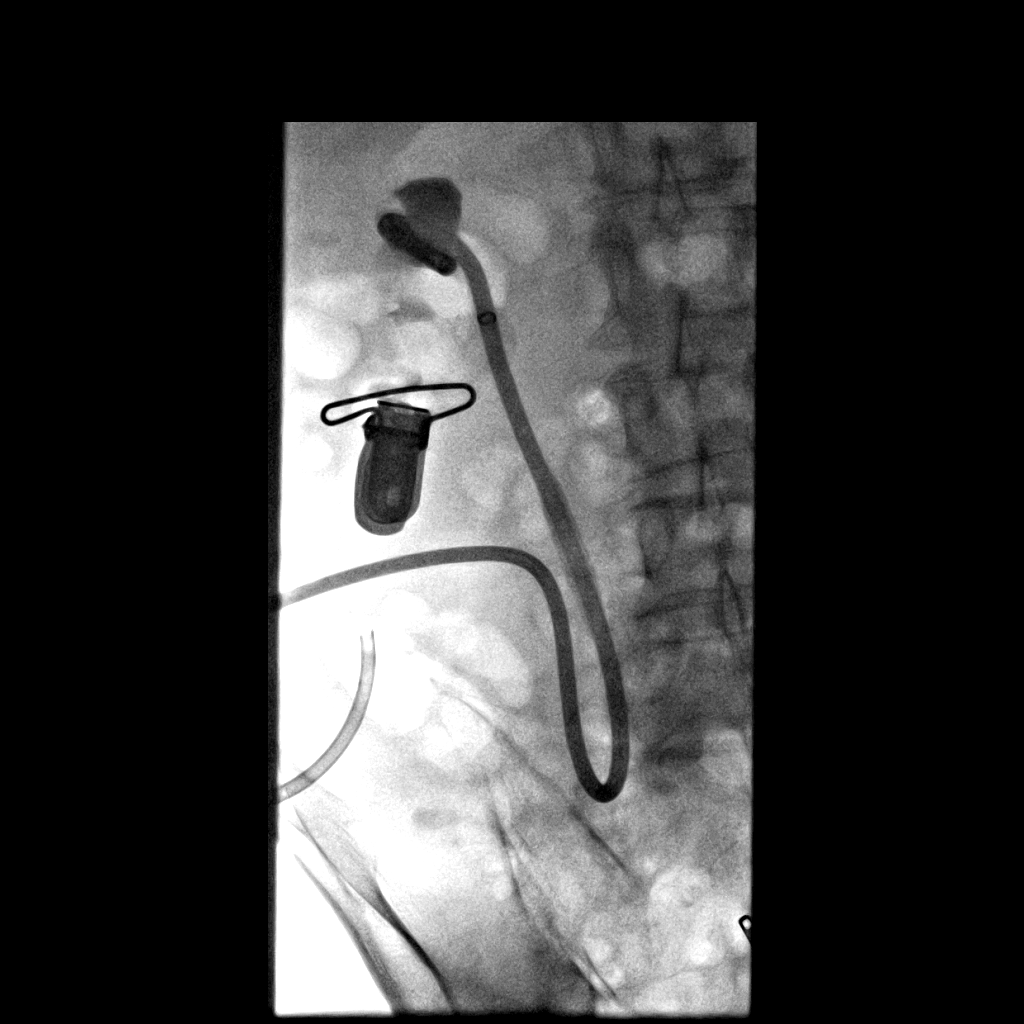
[im 2/2]
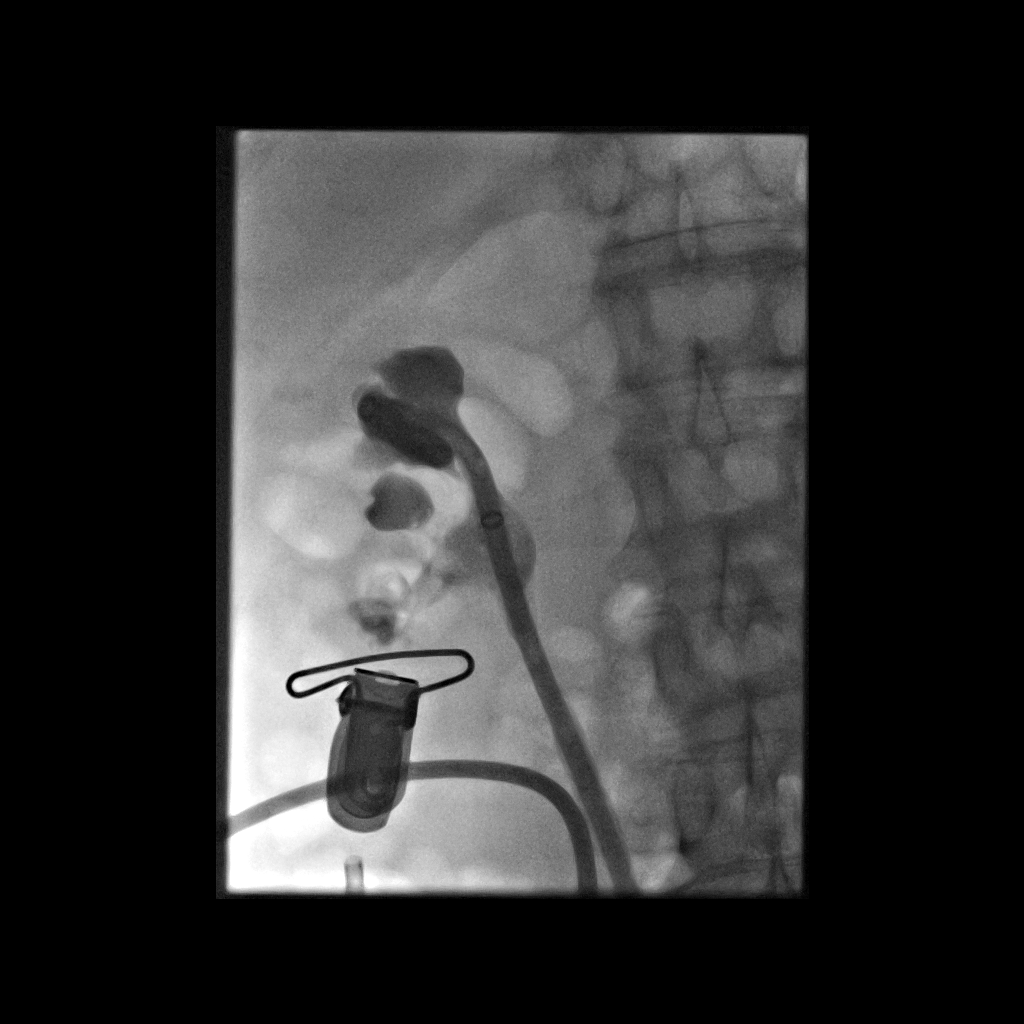

[2 of 2 positions shown; findings below may reference images not displayed]

MEDICATIONS:
None.

ANESTHESIA/SEDATION:
None.

CONTRAST:  10 cc Isovue 300 - administered into the collecting
system(s)

FLUOROSCOPY TIME:  Fluoroscopy Time: 11 minutes 48 seconds (212.8
mGy).

COMPLICATIONS:
None immediate.

PROCEDURE:
Informed written consent was obtained from the patient after a
thorough discussion of the procedural risks, benefits and
alternatives. All questions were addressed. Maximal Sterile Barrier
Technique was utilized including caps, mask, sterile gowns, sterile
gloves, sterile drape, hand hygiene and skin antiseptic. A timeout
was performed prior to the initiation of the procedure.

Under sterile conditions, the existing 14 French nephro ureteral
catheter was injected. This confirms tubal occlusion again. Catheter
was retracted and cut. Eventually the catheter was successfully
removed over a stiff glide wire. Access was advanced back into the
renal pelvis. Fourteen French nephro ureteral catheter was
repositioned in the renal pelvis extending into the upper pole
calyx. Contrast injection confirms position. No immediate
complication.
IMPRESSION: Difficult but successful fluoroscopic exchange of the right chronic
retrograde nephro ureteral catheter through the ileal conduit.

## 2017-11-18 ENCOUNTER — Ambulatory Visit (HOSPITAL_COMMUNITY)
Admission: RE | Admit: 2017-11-18 | Discharge: 2017-11-18 | Disposition: A | Discharge status: Home / Self Care | Payer: Medicare HMO | Source: Ambulatory Visit | Attending: Urology | Admitting: Urology

## 2017-11-18 ENCOUNTER — Other Ambulatory Visit: Payer: Self-pay | Admitting: Urology

## 2017-11-18 ENCOUNTER — Encounter (HOSPITAL_COMMUNITY): Payer: Self-pay | Admitting: Interventional Radiology

## 2017-11-18 DIAGNOSIS — N135 Crossing vessel and stricture of ureter without hydronephrosis: Secondary | ICD-10-CM

## 2017-11-18 DIAGNOSIS — Z436 Encounter for attention to other artificial openings of urinary tract: Secondary | ICD-10-CM | POA: Insufficient documentation

## 2017-11-18 DIAGNOSIS — C679 Malignant neoplasm of bladder, unspecified: Secondary | ICD-10-CM | POA: Diagnosis not present

## 2017-11-18 HISTORY — PX: IR NEPHROSTOMY EXCHANGE RIGHT: IMG6070

## 2017-11-18 MED ORDER — LIDOCAINE HCL 1 % IJ SOLN
INTRAMUSCULAR | Status: AC
  Filled 2017-11-18: qty 20

## 2017-11-18 MED ORDER — IOPAMIDOL (ISOVUE-300) INJECTION 61%
INTRAVENOUS | Status: AC
  Administered 2017-11-18: 5 mL
  Filled 2017-11-18: qty 50

## 2017-11-18 MED ORDER — LIDOCAINE HCL 1 % IJ SOLN
INTRAMUSCULAR | Status: AC | PRN
  Administered 2017-11-18: 5 mL

## 2017-11-18 MED ORDER — IOPAMIDOL (ISOVUE-300) INJECTION 61%
5.0000 mL | Freq: Once | INTRAVENOUS | Status: AC | PRN
  Administered 2017-11-18: 5 mL

## 2017-11-25 DIAGNOSIS — I10 Essential (primary) hypertension: Secondary | ICD-10-CM | POA: Diagnosis not present

## 2017-11-25 DIAGNOSIS — R82998 Other abnormal findings in urine: Secondary | ICD-10-CM | POA: Diagnosis not present

## 2017-11-25 DIAGNOSIS — R946 Abnormal results of thyroid function studies: Secondary | ICD-10-CM | POA: Diagnosis not present

## 2017-11-25 DIAGNOSIS — M109 Gout, unspecified: Secondary | ICD-10-CM | POA: Diagnosis not present

## 2017-11-25 DIAGNOSIS — M859 Disorder of bone density and structure, unspecified: Secondary | ICD-10-CM | POA: Diagnosis not present

## 2017-11-25 DIAGNOSIS — Z125 Encounter for screening for malignant neoplasm of prostate: Secondary | ICD-10-CM | POA: Diagnosis not present

## 2017-11-25 DIAGNOSIS — E7849 Other hyperlipidemia: Secondary | ICD-10-CM | POA: Diagnosis not present

## 2017-11-26 IMAGING — XA IR BILIARY CATHETER EXCHANGE
1 series · 2 of 2 positions shown · non-contrast
Comparison: 01/11/2016

INDICATION: Chronic indwelling right-sided nephroureteral drainage catheter via
ileostomy to treat ureteral stricture. Exchange of the catheter
needed.

EXAM:
PERC TUBE CHG W/CM

[Series 300: tube placements · 2 of 2 slices shown]
[im 1/2]
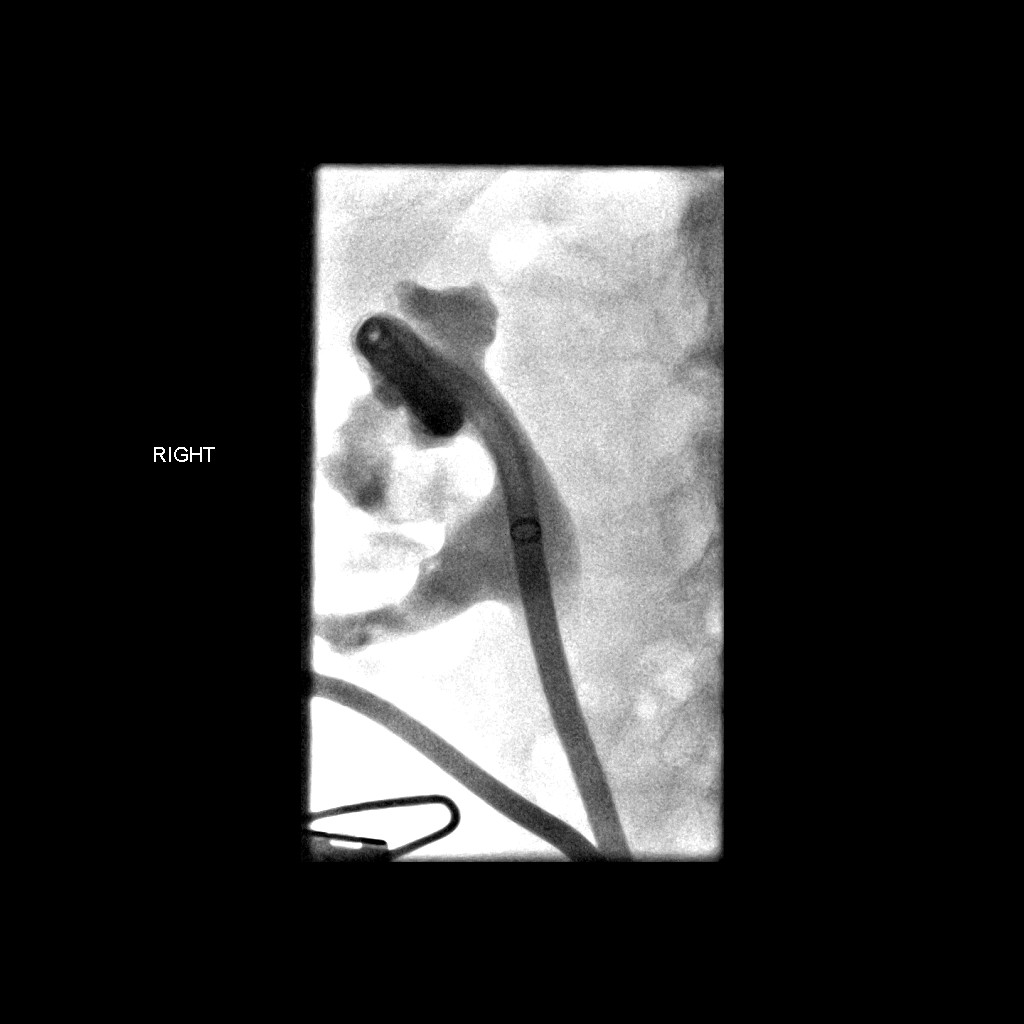
[im 2/2]
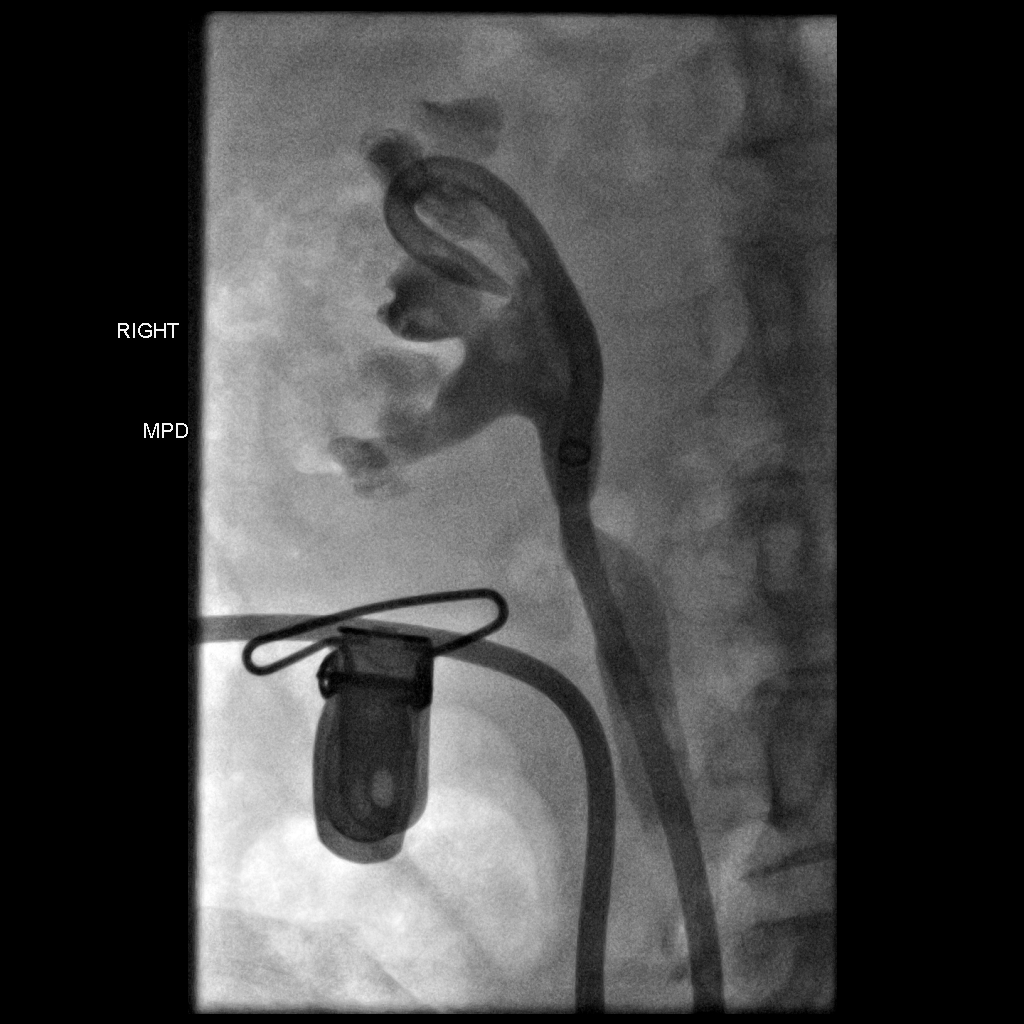

[2 of 2 positions shown; findings below may reference images not displayed]

MEDICATIONS:
None

ANESTHESIA/SEDATION:
None

CONTRAST:  10mL RJQXTW-5CC IOPAMIDOL (RJQXTW-5CC) INJECTION 61% -
administered into the collecting system(s)

FLUOROSCOPY TIME:  Fluoroscopy Time: 1 minute.  22.8 mGy.

COMPLICATIONS:
None immediate.

PROCEDURE:
Informed written consent was obtained from the patient after a
thorough discussion of the procedural risks, benefits and
alternatives. All questions were addressed. Maximal Sterile Barrier
Technique was utilized including caps, mask, sterile gowns, sterile
gloves, sterile drape, hand hygiene and skin antiseptic. A timeout
was performed prior to the initiation of the procedure.

Pre-existing catheter was injected with contrast material under
fluoroscopy. The catheter was then cut and removed over a guidewire.
A new 14 French drainage catheter was advanced over the guidewire.
Catheter positioning was confirmed by fluoroscopy after injection of
contrast.
FINDINGS: The new catheter was formed in the upper pole collecting system.
IMPRESSION: Exchange of indwelling right-sided 14 French nephroureteral catheter
via ileostomy.

## 2017-12-01 DIAGNOSIS — Z23 Encounter for immunization: Secondary | ICD-10-CM | POA: Diagnosis not present

## 2017-12-01 DIAGNOSIS — Z936 Other artificial openings of urinary tract status: Secondary | ICD-10-CM | POA: Diagnosis not present

## 2017-12-01 DIAGNOSIS — Z0000 Encounter for general adult medical examination without abnormal findings: Secondary | ICD-10-CM | POA: Diagnosis not present

## 2017-12-01 DIAGNOSIS — I350 Nonrheumatic aortic (valve) stenosis: Secondary | ICD-10-CM | POA: Diagnosis not present

## 2017-12-01 DIAGNOSIS — D7589 Other specified diseases of blood and blood-forming organs: Secondary | ICD-10-CM | POA: Diagnosis not present

## 2017-12-01 DIAGNOSIS — R69 Illness, unspecified: Secondary | ICD-10-CM | POA: Diagnosis not present

## 2017-12-01 DIAGNOSIS — I8290 Acute embolism and thrombosis of unspecified vein: Secondary | ICD-10-CM | POA: Diagnosis not present

## 2017-12-01 DIAGNOSIS — C679 Malignant neoplasm of bladder, unspecified: Secondary | ICD-10-CM | POA: Diagnosis not present

## 2017-12-01 DIAGNOSIS — C61 Malignant neoplasm of prostate: Secondary | ICD-10-CM | POA: Diagnosis not present

## 2017-12-01 DIAGNOSIS — N184 Chronic kidney disease, stage 4 (severe): Secondary | ICD-10-CM | POA: Diagnosis not present

## 2017-12-01 DIAGNOSIS — M25561 Pain in right knee: Secondary | ICD-10-CM | POA: Diagnosis not present

## 2017-12-04 DIAGNOSIS — S61216A Laceration without foreign body of right little finger without damage to nail, initial encounter: Secondary | ICD-10-CM | POA: Diagnosis not present

## 2017-12-04 DIAGNOSIS — S51012A Laceration without foreign body of left elbow, initial encounter: Secondary | ICD-10-CM | POA: Diagnosis not present

## 2017-12-04 DIAGNOSIS — S61212A Laceration without foreign body of right middle finger without damage to nail, initial encounter: Secondary | ICD-10-CM | POA: Diagnosis not present

## 2017-12-04 DIAGNOSIS — S61214A Laceration without foreign body of right ring finger without damage to nail, initial encounter: Secondary | ICD-10-CM | POA: Diagnosis not present

## 2017-12-09 DIAGNOSIS — C679 Malignant neoplasm of bladder, unspecified: Secondary | ICD-10-CM | POA: Diagnosis not present

## 2017-12-09 DIAGNOSIS — Z936 Other artificial openings of urinary tract status: Secondary | ICD-10-CM | POA: Diagnosis not present

## 2017-12-15 DIAGNOSIS — Z4802 Encounter for removal of sutures: Secondary | ICD-10-CM | POA: Diagnosis not present

## 2017-12-16 ENCOUNTER — Encounter: Payer: Self-pay | Admitting: Internal Medicine

## 2017-12-16 ENCOUNTER — Ambulatory Visit (INDEPENDENT_AMBULATORY_CARE_PROVIDER_SITE_OTHER): Payer: Medicare HMO | Admitting: Internal Medicine

## 2017-12-16 VITALS — BP 132/74 | HR 81 | Ht 67.0 in | Wt 152.6 lb

## 2017-12-16 DIAGNOSIS — I10 Essential (primary) hypertension: Secondary | ICD-10-CM | POA: Diagnosis not present

## 2017-12-16 DIAGNOSIS — R001 Bradycardia, unspecified: Secondary | ICD-10-CM | POA: Diagnosis not present

## 2017-12-16 DIAGNOSIS — Z950 Presence of cardiac pacemaker: Secondary | ICD-10-CM | POA: Diagnosis not present

## 2017-12-16 LAB — CUP PACEART INCLINIC DEVICE CHECK
Brady Statistic AP VP Percent: 84 %
Brady Statistic AP VS Percent: 15 %
Brady Statistic AS VS Percent: 1 %
Implantable Lead Implant Date: 20090917
Implantable Lead Location: 753860
Lead Channel Impedance Value: 447 Ohm
Lead Channel Pacing Threshold Amplitude: 0.5 V
Lead Channel Pacing Threshold Amplitude: 0.5 V
Lead Channel Pacing Threshold Pulse Width: 0.4 ms
Lead Channel Pacing Threshold Pulse Width: 0.4 ms
Lead Channel Sensing Intrinsic Amplitude: 4 mV
Lead Channel Setting Pacing Amplitude: 2 V
Lead Channel Setting Pacing Pulse Width: 0.4 ms
MDC IDC LEAD IMPLANT DT: 20090917
MDC IDC LEAD LOCATION: 753859
MDC IDC MSMT BATTERY IMPEDANCE: 2120 Ohm
MDC IDC MSMT BATTERY REMAINING LONGEVITY: 26 mo
MDC IDC MSMT BATTERY VOLTAGE: 2.74 V
MDC IDC MSMT LEADCHNL RA PACING THRESHOLD AMPLITUDE: 0.5 V
MDC IDC MSMT LEADCHNL RA PACING THRESHOLD PULSEWIDTH: 0.4 ms
MDC IDC MSMT LEADCHNL RA PACING THRESHOLD PULSEWIDTH: 0.4 ms
MDC IDC MSMT LEADCHNL RV IMPEDANCE VALUE: 489 Ohm
MDC IDC MSMT LEADCHNL RV PACING THRESHOLD AMPLITUDE: 0.625 V
MDC IDC PG IMPLANT DT: 20090917
MDC IDC SESS DTM: 20190924154243
MDC IDC SET LEADCHNL RV PACING AMPLITUDE: 2.5 V
MDC IDC SET LEADCHNL RV SENSING SENSITIVITY: 2 mV
MDC IDC STAT BRADY AS VP PERCENT: 1 %

## 2017-12-16 NOTE — Progress Notes (Signed)
HPI [name] returns today for ongoing evaluation and management of symptomatic bradycardia secondary to sinus node dysfunction, status post pacemaker insertion and hypertension. In the interim, he continues to do well. He denies chest pain, shortness of breath, or syncope. He has very minimal peripheral edema. He is fairly sedentary.  No Known Allergies   Current Outpatient Medications  Medication Sig Dispense Refill  . acetaminophen (TYLENOL) 500 MG tablet Take 1,000 mg by mouth every 8 (eight) hours as needed for mild pain or headache.    Marland Kitchen amLODipine (NORVASC) 2.5 MG tablet Take 2.5 mg by mouth daily.    Marland Kitchen amLODipine (NORVASC) 2.5 MG tablet Take 2.5 mg by mouth daily.    Marland Kitchen aspirin EC 81 MG tablet Take 81 mg by mouth every morning.    Marland Kitchen aspirin EC 81 MG tablet Take 81 mg by mouth daily.    . Calcium Polycarbophil (FIBER-CAPS PO) Take 2 capsules by mouth daily.    . cholecalciferol (VITAMIN D) 1000 units tablet Take 1,000 Units by mouth daily.    . Cholecalciferol (VITAMIN D-3) 1000 UNITS CAPS Take 1 capsule by mouth every morning.    . ciprofloxacin (CIPRO) 250 MG tablet As directed    . colchicine 0.6 MG tablet Take 0.6 mg by mouth daily.    Marland Kitchen docusate sodium (COLACE) 100 MG capsule Take 300 mg by mouth daily.     Marland Kitchen docusate sodium (COLACE) 100 MG capsule Take 200 mg by mouth daily.    . febuxostat (ULORIC) 40 MG tablet Take 40 mg by mouth daily.    . Hyprom-Naphaz-Polysorb-Zn Sulf (CLEAR EYES COMPLETE OP) Apply 1-2 drops to eye daily as needed (dry eyes).    Marland Kitchen KRILL OIL PO Take 2,000 mg by mouth daily.     . Multiple Vitamins-Minerals (PRESERVISION AREDS 2 PO) Take 1 tablet by mouth 2 (two) times daily.    . Multiple Vitamins-Minerals (VITEYES AREDS ADVANCED PO) Take 2 capsules by mouth daily.    . OMEGA-3 KRILL OIL PO Take 1 tablet by mouth every morning.    Marland Kitchen ULORIC 40 MG tablet Take 1 tablet by mouth daily.     No current facility-administered medications for this  visit.      Past Medical History:  Diagnosis Date  . Aortic valve stenosis    a. mild to moderate by echo 2013  . Bradycardia    MDT dual chamber pacemaker  . Cancer [facility])    bladder cancer  . Chronic kidney disease   . Deep venous thrombosis (HCC)    hx of  LEFT ARM AFTER PACEMAKER INSERTION ABOUT 6 YRS AGO  . Depression   . Gout   . Hypertension   . Kidney stone   . Macular degeneration    PT STATES HIS VISION STILL OKAY FOR DRIVING  . Osteoporosis   . Prostate cancer ([place])    AND BLADDER CANCER - S/P URETEROILEAL CONDUIT  . Renal disorder   . S/P ileal conduit (HCC)         ROS:   All systems reviewed and negative except as noted in the HPI.   Past Surgical History:  Procedure Laterality Date  . 07/20/13  CONVERSION OF RIGHT SIDED NEPHROSTOMY CATHETER TO RIGHT SIDED NEPHRO URETERAL CATHETER - DONE IN INTERVENTIONAL RADIOLOGY    . APPENDECTOMY    . CATARACT EXTRACTION, BILATERAL    . CYSTECTOMY    . ilial conduit     for bladder cancer  .  IR GENERIC HISTORICAL  10/26/2015   IR NEPHROSTOMY TUBE CHANGE 10/26/2015 Jacqulynn Cadet, MD [facility]  . IR GENERIC HISTORICAL  12/07/2015   IR NEPHROSTOMY TUBE CHANGE 12/07/2015 Greggory Keen, MD [facility]  . IR GENERIC HISTORICAL  01/11/2016   IR NEPHROSTOMY TUBE CHANGE 01/11/2016 Greggory Keen, MD [facility]  . IR GENERIC HISTORICAL  02/08/2016   IR NEPHROSTOMY TUBE CHANGE 02/08/2016 [facility]  . IR GENERIC HISTORICAL  03/14/2016   IR NEPHROSTOMY TUBE CHANGE 03/14/2016 Jacqulynn Cadet, MD [facility]  . IR GENERIC HISTORICAL  04/22/2016   IR NEPHROSTOMY TUBE CHANGE 04/22/2016 Aletta Edouard, MD [facility]  . IR GENERIC HISTORICAL  05/27/2016   IR NEPHROSTOMY TUBE CHANGE 05/27/2016 Arne Cleveland, MD [facility]  . IR NEPHROSTOMY EXCHANGE RIGHT  07/25/2017  . IR NEPHROSTOMY EXCHANGE RIGHT  10/13/2017  . IR NEPHROSTOMY EXCHANGE RIGHT  11/18/2017  . IR NEPHROSTOMY PLACEMENT RIGHT  06/13/2017  . IR NEPHROSTOMY  TUBE CHANGE  07/01/2016  . IR NEPHROSTOMY TUBE CHANGE  08/05/2016  . IR NEPHROSTOMY TUBE CHANGE  09/18/2016  . IR NEPHROSTOMY TUBE CHANGE  10/24/2016  . IR NEPHROSTOMY TUBE CHANGE  11/18/2016  . IR NEPHROSTOMY TUBE CHANGE  12/30/2016  . IR NEPHROSTOMY TUBE CHANGE  02/03/2017  . IR NEPHROSTOMY TUBE CHANGE  03/13/2017  . IR NEPHROSTOMY TUBE CHANGE  04/17/2017  . IR NEPHROSTOMY TUBE CHANGE  05/28/2017  . IR NEPHROSTOMY TUBE CHANGE  09/04/2017  . KIDNEY STONE SURGERY    . LYMPHADENECTOMY    . NEPHROLITHOTOMY  09/02/2011   Procedure: NEPHROLITHOTOMY PERCUTANEOUS;  Surgeon: Malka So, MD;  Location: [facility];  Service: Urology;  Laterality: Left;  . NEPHROLITHOTOMY Right 07/27/2013   Procedure: RIGHT PERCUTANEOUS NEPHROLITHOTOMY ;  Surgeon: Irine Seal, MD;  Location: [facility];  Service: Urology;  Laterality: Right;  . PACEMAKER INSERTION  2009   MDT dual chamber pacemaker implanted by Dr Verlon Setting  . PROSTATE SURGERY       Family History  Problem Relation Age of Onset  . Prostate cancer Father 23       died  . Pancreatic cancer Mother 42       died     Social History   Socioeconomic History  . Marital status: Married    Spouse name: Not on file  . Number of children: 3  . Years of education: Not on file  . Highest education level: Not on file  Occupational History    Employer: RETIRED  Social Needs  . Financial resource strain: Not on file  . Food insecurity:    Worry: Not on file    Inability: Not on file  . Transportation needs:    Medical: Not on file    Non-medical: Not on file  Tobacco Use  . Smoking status: Former Smoker    Types: Cigarettes    Last attempt to quit: 03/25/1952    Years since quitting: 65.7  . Smokeless tobacco: Never Used  Substance and Sexual Activity  . Alcohol use: No  . Drug use: No  . Sexual activity: Never  Lifestyle  . Physical activity:    Days per week: Not on file    Minutes per session: Not on file  . Stress: Not on file  Relationships  .  Social connections:    Talks on phone: Not on file    Gets together: Not on file    Attends religious service: Not on file    Active member of club or  organization: Not on file    Attends meetings of clubs or organizations: Not on file    Relationship status: Not on file  . Intimate partner violence:    Fear of current or ex partner: Not on file    Emotionally abused: Not on file    Physically abused: Not on file    Forced sexual activity: Not on file  Other Topics Concern  . Not on file  Social History Narrative   ** Merged History Encounter **         BP 132/74   Pulse 81   Ht 5\' 7"  (1.702 m)   Wt 152 lb 9.6 oz (69.2 kg)   SpO2 99%   BMI 23.90 kg/m   Physical Exam:  Well appearing NAD HEENT: Unremarkable Neck:  No JVD, no thyromegally Lymphatics:  No adenopathy Back:  No CVA tenderness Lungs:  Clear with no wheezes HEART:  Regular rate rhythm, 1/6 systolic murmurs, no rubs, no clicks Abd:  soft, positive bowel sounds, no organomegally, no rebound, no guarding Ext:  2 plus pulses, no edema, no cyanosis, no clubbing Skin:  No rashes no nodules Neuro:  CN II through XII intact, motor grossly intact  EKG - NSR with pacing induced LBBB  DEVICE  Normal device function.  See PaceArt for details.   Assess/Plan: 1. CHB - he is asymptomatic, s/p PPM insertion 2. PPM - his medtronic DDD PM is working normally with just over 2 years of battery longevity 3. AS - he has not had an echo in 6 years. By exam he has severe AS. He is asymptomatic. His advanced age would make him only a fair candidate for TAVR. However, he is asymptomatic. He will undergo watchful waiting. 4. Diastolic heart failure - his symptoms are class 2. No change in his meds.  Mikle Bosworth.D.

## 2017-12-16 NOTE — Patient Instructions (Addendum)
Medication Instructions:  Your physician recommends that you continue on your current medications as directed. Please refer to the Current Medication list given to you today.  Labwork: None ordered.  Testing/Procedures: None ordered.  Follow-Up:  Your physician wants you to follow-up in: 6 months with device clinic for a device check.      Your physician wants you to follow-up in: one year with Dr. Lovena Le.   You will receive a reminder letter in the mail two months in advance. If you don't receive a letter, please call our office to schedule the follow-up appointment.  Any Other Special Instructions Will Be Listed Below (If Applicable).  If you need a refill on your cardiac medications before your next appointment, please call your pharmacy.

## 2017-12-22 ENCOUNTER — Encounter (INDEPENDENT_AMBULATORY_CARE_PROVIDER_SITE_OTHER): Payer: Medicare HMO | Admitting: Ophthalmology

## 2017-12-22 DIAGNOSIS — I10 Essential (primary) hypertension: Secondary | ICD-10-CM | POA: Diagnosis not present

## 2017-12-22 DIAGNOSIS — H353221 Exudative age-related macular degeneration, left eye, with active choroidal neovascularization: Secondary | ICD-10-CM

## 2017-12-22 DIAGNOSIS — H43813 Vitreous degeneration, bilateral: Secondary | ICD-10-CM | POA: Diagnosis not present

## 2017-12-22 DIAGNOSIS — H353114 Nonexudative age-related macular degeneration, right eye, advanced atrophic with subfoveal involvement: Secondary | ICD-10-CM

## 2017-12-22 DIAGNOSIS — H35033 Hypertensive retinopathy, bilateral: Secondary | ICD-10-CM

## 2017-12-30 ENCOUNTER — Other Ambulatory Visit: Payer: Self-pay | Admitting: Urology

## 2017-12-30 ENCOUNTER — Ambulatory Visit (HOSPITAL_COMMUNITY)
Admission: RE | Admit: 2017-12-30 | Discharge: 2017-12-30 | Disposition: A | Discharge status: Home / Self Care | Payer: Medicare HMO | Source: Ambulatory Visit | Attending: Urology | Admitting: Urology

## 2017-12-30 ENCOUNTER — Encounter (HOSPITAL_COMMUNITY): Payer: Self-pay | Admitting: Interventional Radiology

## 2017-12-30 DIAGNOSIS — Z436 Encounter for attention to other artificial openings of urinary tract: Secondary | ICD-10-CM | POA: Diagnosis present

## 2017-12-30 DIAGNOSIS — N135 Crossing vessel and stricture of ureter without hydronephrosis: Secondary | ICD-10-CM | POA: Diagnosis not present

## 2017-12-30 HISTORY — PX: IR NEPHROSTOMY EXCHANGE RIGHT: IMG6070

## 2017-12-30 MED ORDER — LIDOCAINE HCL 1 % IJ SOLN
INTRAMUSCULAR | Status: AC
  Filled 2017-12-30: qty 20

## 2017-12-30 MED ORDER — IOPAMIDOL (ISOVUE-300) INJECTION 61%
INTRAVENOUS | Status: AC
  Administered 2017-12-30: 10 mL
  Filled 2017-12-30: qty 50

## 2017-12-30 MED ORDER — LIDOCAINE HCL (PF) 1 % IJ SOLN
INTRAMUSCULAR | Status: DC | PRN
  Administered 2017-12-30 (×2): 5 mL

## 2017-12-31 IMAGING — XA IR BILIARY CATHETER EXCHANGE
1 series · 4 of 4 positions shown · non-contrast
Comparison: Prior nephrostomy exchange 02/08/2016

INDICATION: [AGE] male with a chronic indwelling right-sided retrograde
nephroureteral drainage catheter via right lower quadrant ileostomy.
He presents for routine catheter exchange.

EXAM:
PERC TUBE CHG W/CM

[Series 300: tube placements · 4 of 4 slices shown]
[im 1/4]
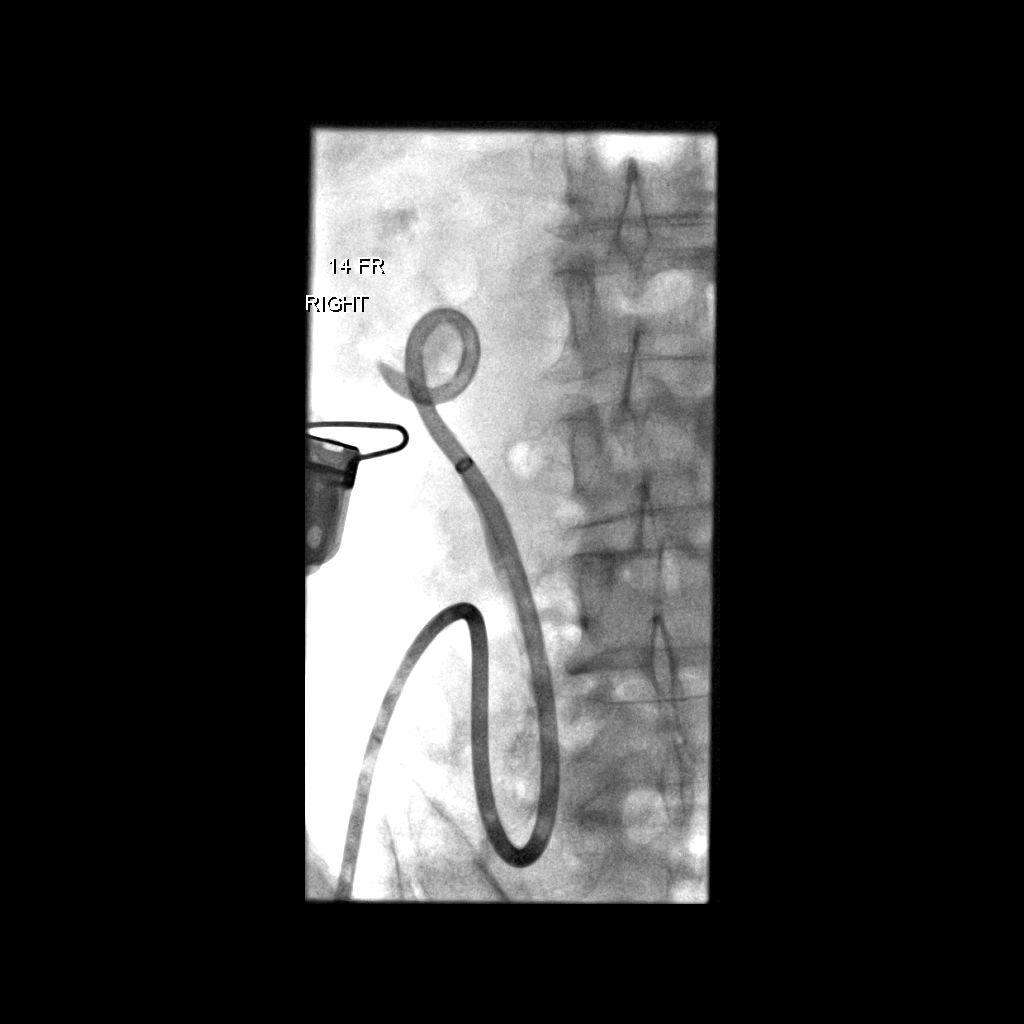
[im 2/4]
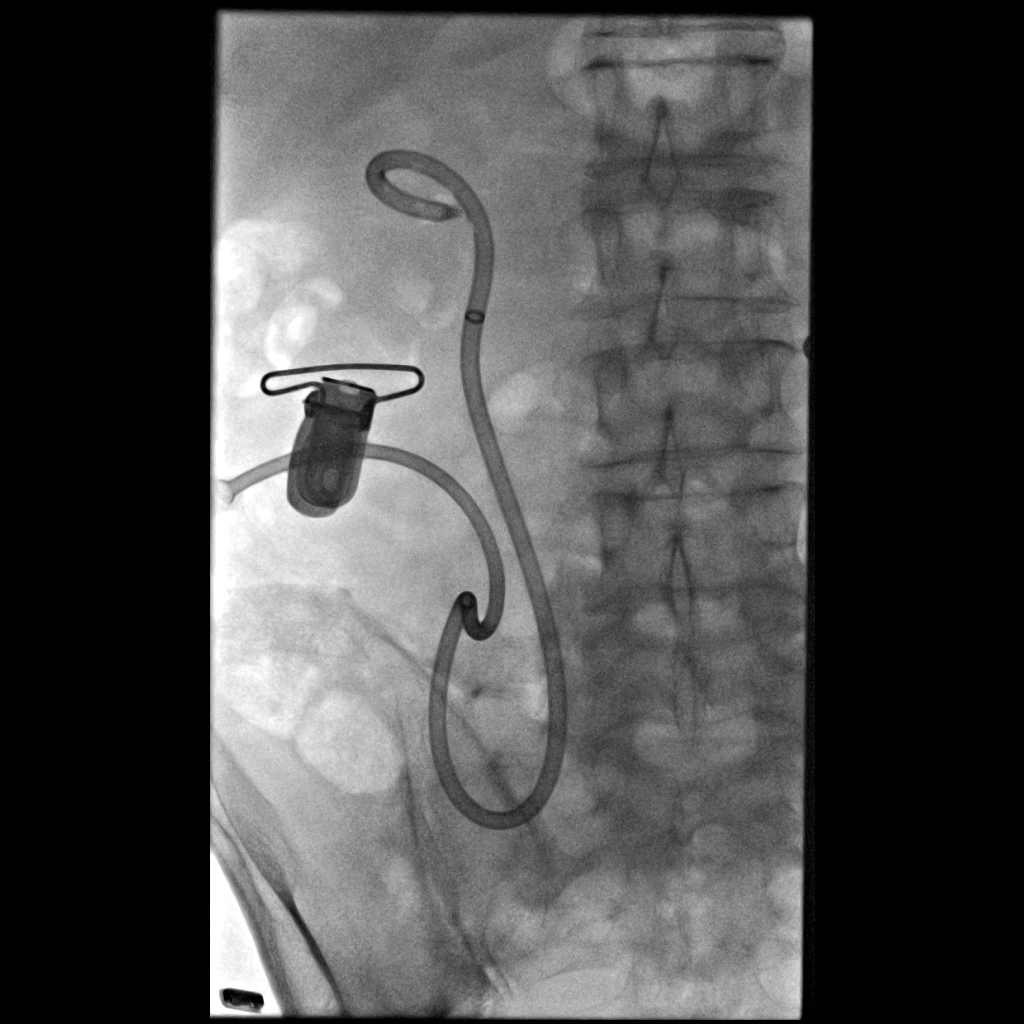
[im 3/4]
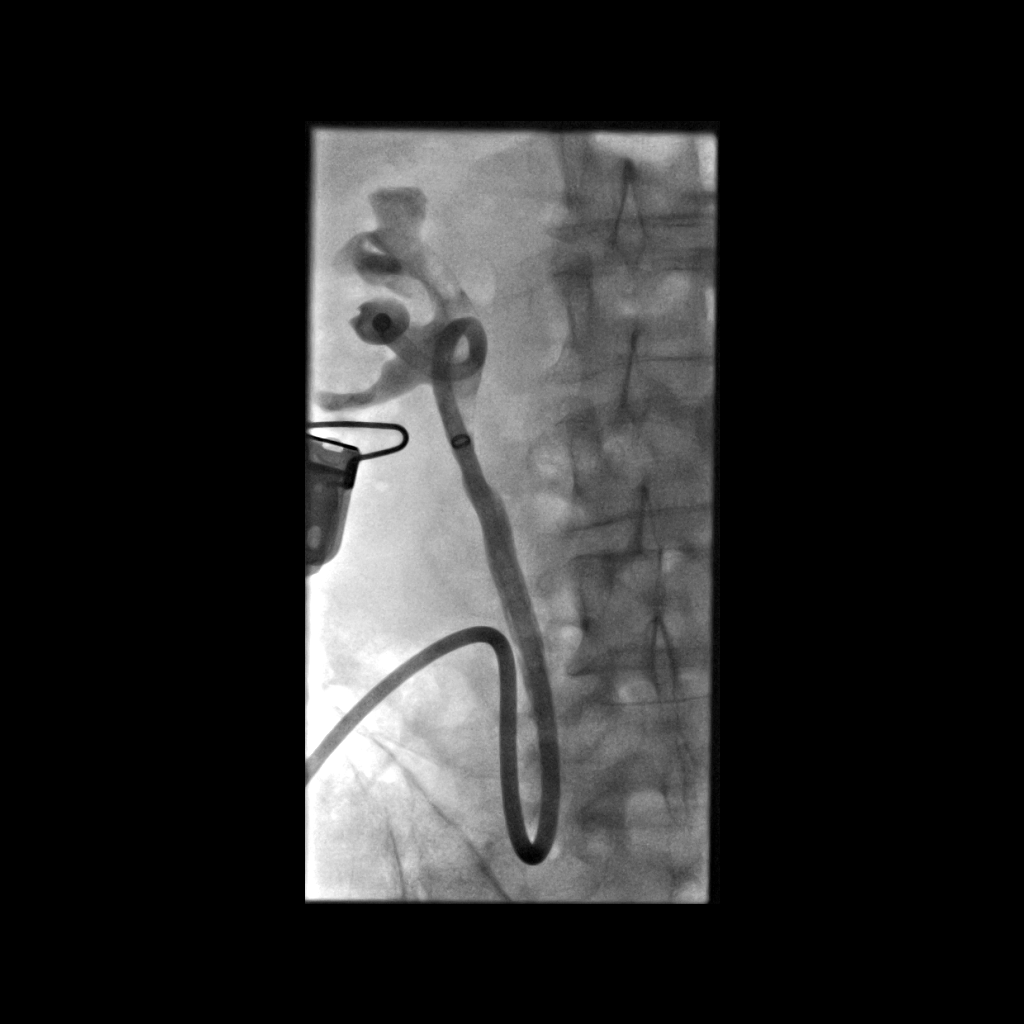
[im 4/4]
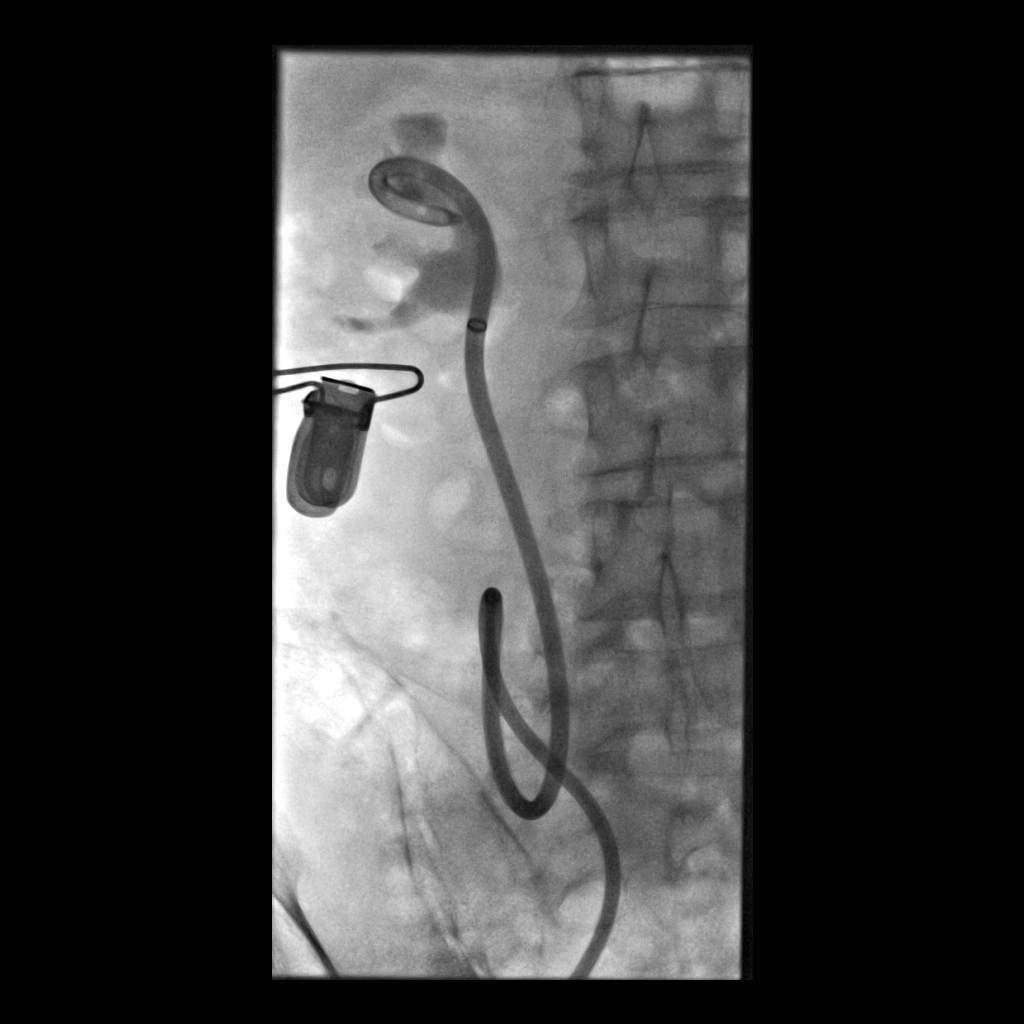

[4 of 4 positions shown; findings below may reference images not displayed]

MEDICATIONS:
None

ANESTHESIA/SEDATION:
None

CONTRAST:  10mL F7LOFU-DII IOPAMIDOL (F7LOFU-DII) INJECTION 61% -
administered into the collecting system(s)

FLUOROSCOPY TIME:  Fluoroscopy Time: 1 minutes 54 seconds (32 mGy).

COMPLICATIONS:
None immediate.

PROCEDURE:
Informed written consent was obtained from the patient after a
thorough discussion of the procedural risks, benefits and
alternatives. All questions were addressed. Maximal Sterile Barrier
Technique was utilized including caps, mask, sterile gowns, sterile
gloves, sterile drape, hand hygiene and skin antiseptic. A timeout
was performed prior to the initiation of the procedure.

A gentle hand injection of contrast material was performed through
the existing tube. The locking loop is in the upper pole
infundibulum.

The tube was cut and removed over a stiff Amplatz wire. A new 14
[place] drainage catheter was then advanced over the
wire and formed within the renal pelvis. A gentle hand injection of
contrast material under real-time fluoroscopy confirmed tube
location. An image was saved for the medical record.

The tube was placed back in the ileostomy bag.
IMPRESSION: Routine exchange of indwelling right-sided 14 French retrograde
nephroureteral catheter via right lower quadrant ileostomy.

## 2018-01-01 DIAGNOSIS — Z8551 Personal history of malignant neoplasm of bladder: Secondary | ICD-10-CM | POA: Diagnosis not present

## 2018-01-01 DIAGNOSIS — N200 Calculus of kidney: Secondary | ICD-10-CM | POA: Diagnosis not present

## 2018-01-01 DIAGNOSIS — N135 Crossing vessel and stricture of ureter without hydronephrosis: Secondary | ICD-10-CM | POA: Diagnosis not present

## 2018-01-05 DIAGNOSIS — E875 Hyperkalemia: Secondary | ICD-10-CM | POA: Diagnosis not present

## 2018-01-05 DIAGNOSIS — M859 Disorder of bone density and structure, unspecified: Secondary | ICD-10-CM | POA: Diagnosis not present

## 2018-01-30 ENCOUNTER — Encounter (HOSPITAL_COMMUNITY): Payer: Medicare HMO

## 2018-02-02 ENCOUNTER — Emergency Department (HOSPITAL_COMMUNITY)
Admission: EM | Admit: 2018-02-02 | Discharge: 2018-02-02 | Disposition: A | Discharge status: Home / Self Care | Payer: Medicare HMO | Attending: Emergency Medicine | Admitting: Emergency Medicine

## 2018-02-02 ENCOUNTER — Encounter (HOSPITAL_COMMUNITY): Payer: Self-pay | Admitting: Emergency Medicine

## 2018-02-02 DIAGNOSIS — I129 Hypertensive chronic kidney disease with stage 1 through stage 4 chronic kidney disease, or unspecified chronic kidney disease: Secondary | ICD-10-CM | POA: Diagnosis not present

## 2018-02-02 DIAGNOSIS — Z7982 Long term (current) use of aspirin: Secondary | ICD-10-CM | POA: Diagnosis not present

## 2018-02-02 DIAGNOSIS — Z87891 Personal history of nicotine dependence: Secondary | ICD-10-CM | POA: Insufficient documentation

## 2018-02-02 DIAGNOSIS — Z8551 Personal history of malignant neoplasm of bladder: Secondary | ICD-10-CM | POA: Insufficient documentation

## 2018-02-02 DIAGNOSIS — Z86718 Personal history of other venous thrombosis and embolism: Secondary | ICD-10-CM | POA: Insufficient documentation

## 2018-02-02 DIAGNOSIS — Z79899 Other long term (current) drug therapy: Secondary | ICD-10-CM | POA: Insufficient documentation

## 2018-02-02 DIAGNOSIS — Z950 Presence of cardiac pacemaker: Secondary | ICD-10-CM | POA: Diagnosis not present

## 2018-02-02 DIAGNOSIS — Z8546 Personal history of malignant neoplasm of prostate: Secondary | ICD-10-CM | POA: Insufficient documentation

## 2018-02-02 DIAGNOSIS — K5900 Constipation, unspecified: Secondary | ICD-10-CM | POA: Diagnosis present

## 2018-02-02 DIAGNOSIS — K5641 Fecal impaction: Secondary | ICD-10-CM

## 2018-02-02 DIAGNOSIS — N189 Chronic kidney disease, unspecified: Secondary | ICD-10-CM | POA: Insufficient documentation

## 2018-02-02 MED ORDER — MILK AND MOLASSES ENEMA
1.0000 | Freq: Once | RECTAL | Status: DC
  Filled 2018-02-02: qty 250

## 2018-02-02 MED ORDER — FLEET ENEMA 7-19 GM/118ML RE ENEM
1.0000 | ENEMA | Freq: Once | RECTAL | Status: AC
  Administered 2018-02-02: 1 via RECTAL
  Filled 2018-02-02: qty 1

## 2018-02-02 NOTE — ED Triage Notes (Signed)
Patient here from home with complaints of constipation x2 days. Reports that he had a bowel movement yesterday that was "not normal". States that it was hard.

## 2018-02-02 NOTE — ED Provider Notes (Signed)
[facility] DEPT Provider Note   CSN: [id number] Arrival date & time: 02/02/18  1054     History   Chief Complaint Chief Complaint  Patient presents with  . Constipation    HPI [name] [name] is a 84 y.o. male.  Patient is a 84 year old male with past medical history of bladder cancer with ileal conduit surgery, hypertension, chronic renal insufficiency.  He presents today for evaluation of constipation.  He has not had a bowel movement in the past 2 days.  He is now having difficulty using the bathroom.  He reports large stool that he cannot pass.  He denies fevers, chills, or vomiting.  He denies abdominal pain.  The history is provided by the patient.  Constipation   This is a new problem. The current episode started 2 days ago. The stool is described as firm. Pertinent negatives include no abdominal pain. There is fiber in the patient's diet. Treatments tried: Stool softener. The treatment provided no relief.    Past Medical History:  Diagnosis Date  . Aortic valve stenosis    a. mild to moderate by echo 2013  . Bradycardia    MDT dual chamber pacemaker  . Cancer [facility])    bladder cancer  . Chronic kidney disease   . Deep venous thrombosis (HCC)    hx of  LEFT ARM AFTER PACEMAKER INSERTION ABOUT 6 YRS AGO  . Depression   . Gout   . Hypertension   . Kidney stone   . Macular degeneration    PT STATES HIS VISION STILL OKAY FOR DRIVING  . Osteoporosis   . Prostate cancer ([place])    AND BLADDER CANCER - S/P URETEROILEAL CONDUIT  . Renal disorder   . S/P ileal conduit [facility])         Patient Active Problem List   Diagnosis Date Noted  . Malfunction of nephrostomy tube ([place]) 06/13/2017  . HTN (hypertension) 06/13/2017  . Gout 06/13/2017  . Legally blind 06/13/2017  . Bladder cancer ([place]) 06/13/2017  . Blood poisoning   . Bacteremia due to group B Streptococcus 02/07/2014  . Sepsis ([place]) 02/04/2014  . UTI (urinary tract infection)  07/30/2013  . ARF (acute renal failure) ([place]) 07/29/2013  . CKD (chronic kidney disease) 07/29/2013  . Fever, unspecified 07/29/2013  . Renal stone 07/27/2013  . Hydronephrosis, right 07/15/2013  . Infection of urinary tract 07/15/2013  . Hyperkalemia 07/15/2013  . Kidney stone 08/20/2011  . Obstructive uropathy 08/16/2011  . Acute renal failure ([place]) 08/16/2011  . Leukocytosis 08/16/2011  . Gout 08/16/2011  . Aortic stenosis 08/16/2011  . Hypertension 10/02/2010  . Pacemaker 10/02/2010  . Sinus bradycardia 10/02/2010    Past Surgical History:  Procedure Laterality Date  . 07/20/13  CONVERSION OF RIGHT SIDED NEPHROSTOMY CATHETER TO RIGHT SIDED NEPHRO URETERAL CATHETER - DONE IN INTERVENTIONAL RADIOLOGY    . APPENDECTOMY    . CATARACT EXTRACTION, BILATERAL    . CYSTECTOMY    . ilial conduit     for bladder cancer  . IR GENERIC HISTORICAL  10/26/2015   IR NEPHROSTOMY TUBE CHANGE 10/26/2015 Jacqulynn Cadet, MD [facility]-INTERV RAD  . IR GENERIC HISTORICAL  12/07/2015   IR NEPHROSTOMY TUBE CHANGE 12/07/2015 Greggory Keen, MD [facility]INTER[facility]  . IR GENERIC HISTORICAL  01/11/2016   IR NEPHROSTOMY TUBE CHANGE 01/11/2016 Greggory Keen, MD [facility]  . IR GENERIC HISTORICAL  02/08/2016   IR NEPHROSTOMY TUBE CHANGE 02/08/2016 [facility]  . IR GENERIC HISTORICAL  03/14/2016   IR NEPHROSTOMY TUBE CHANGE 03/14/2016 Jacqulynn Cadet, MD [facility]  . IR GENERIC HISTORICAL  04/22/2016   IR NEPHROSTOMY TUBE CHANGE 04/22/2016 Aletta Edouard, MD [facility]INTER[facility]  . IR GENERIC HISTORICAL  05/27/2016   IR NEPHROSTOMY TUBE CHANGE 05/27/2016 Arne Cleveland, MD [facility]INTER[facility]  . IR NEPHROSTOMY EXCHANGE RIGHT  07/25/2017  . IR NEPHROSTOMY EXCHANGE RIGHT  10/13/2017  . IR NEPHROSTOMY EXCHANGE RIGHT  11/18/2017  . IR NEPHROSTOMY EXCHANGE RIGHT  12/30/2017  . IR NEPHROSTOMY PLACEMENT RIGHT  06/13/2017  . IR NEPHROSTOMY TUBE CHANGE  07/01/2016  . IR NEPHROSTOMY TUBE CHANGE  08/05/2016  . IR NEPHROSTOMY TUBE CHANGE   09/18/2016  . IR NEPHROSTOMY TUBE CHANGE  10/24/2016  . IR NEPHROSTOMY TUBE CHANGE  11/18/2016  . IR NEPHROSTOMY TUBE CHANGE  12/30/2016  . IR NEPHROSTOMY TUBE CHANGE  02/03/2017  . IR NEPHROSTOMY TUBE CHANGE  03/13/2017  . IR NEPHROSTOMY TUBE CHANGE  04/17/2017  . IR NEPHROSTOMY TUBE CHANGE  05/28/2017  . IR NEPHROSTOMY TUBE CHANGE  09/04/2017  . KIDNEY STONE SURGERY    . LYMPHADENECTOMY    . NEPHROLITHOTOMY  09/02/2011   Procedure: NEPHROLITHOTOMY PERCUTANEOUS;  Surgeon: Malka So, MD;  Location: [facility] ORS;  Service: Urology;  Laterality: Left;  . NEPHROLITHOTOMY Right 07/27/2013   Procedure: RIGHT PERCUTANEOUS NEPHROLITHOTOMY ;  Surgeon: Irine Seal, MD;  Location: [facility] ORS;  Service: Urology;  Laterality: Right;  . PACEMAKER INSERTION  2009   MDT dual chamber pacemaker implanted by Dr Verlon Setting  . PROSTATE SURGERY          Home Medications    Prior to Admission medications   Medication Sig Start Date End Date Taking? Authorizing Provider  acetaminophen (TYLENOL) 500 MG tablet Take 1,000 mg by mouth every 8 (eight) hours as needed for mild pain or headache.    [provider]  amLODipine (NORVASC) 2.5 MG tablet Take 2.5 mg by mouth daily. 12/03/16   [provider]  amLODipine (NORVASC) 2.5 MG tablet Take 2.5 mg by mouth daily.    [provider]  aspirin EC 81 MG tablet Take 81 mg by mouth every morning.    [provider]  aspirin EC 81 MG tablet Take 81 mg by mouth daily.    [provider]  Calcium Polycarbophil (FIBER-CAPS PO) Take 2 capsules by mouth daily.    [provider]  cholecalciferol (VITAMIN D) 1000 units tablet Take 1,000 Units by mouth daily.    [provider]  Cholecalciferol (VITAMIN D-3) 1000 UNITS CAPS Take 1 capsule by mouth every morning.    [provider]  colchicine 0.6 MG tablet Take 0.6 mg by mouth daily.    [provider]  docusate sodium (COLACE) 100 MG capsule Take 300 mg by mouth  daily.     [provider]  docusate sodium (COLACE) 100 MG capsule Take 200 mg by mouth daily.    [provider]  Hyprom-Naphaz-Polysorb-Zn Sulf (CLEAR EYES COMPLETE OP) Apply 1-2 drops to eye daily as needed (dry eyes).    [provider]  KRILL OIL PO Take 2,000 mg by mouth daily.     [provider]  Multiple Vitamins-Minerals (PRESERVISION AREDS 2 PO) Take 1 tablet by mouth 2 (two) times daily.    [provider]  Multiple Vitamins-Minerals (VITEYES AREDS ADVANCED PO) Take 2 capsules by mouth daily.    [provider]  OMEGA-3 KRILL OIL PO Take 1 tablet by mouth every  morning.    [provider]  ULORIC 40 MG tablet Take 1 tablet by mouth daily. 11/24/15   [provider]    Family History Family History  Problem Relation Age of Onset  . Prostate cancer Father 35       died  . Pancreatic cancer Mother 71       died    Social History Social History   Tobacco Use  . Smoking status: Former Smoker    Types: Cigarettes    Last attempt to quit: 03/25/1952    Years since quitting: 65.9  . Smokeless tobacco: Never Used  Substance Use Topics  . Alcohol use: No  . Drug use: No     Allergies   Patient has no known allergies.   Review of Systems Review of Systems  Gastrointestinal: Positive for constipation. Negative for abdominal pain.  All other systems reviewed and are negative.    Physical Exam Updated Vital Signs BP (!) 145/69 (BP Location: Left Arm)   Pulse 89   Temp 98 F (36.7 C) (Oral)   Resp 19   SpO2 99%   Physical Exam  Constitutional: He is oriented to person, place, and time. He appears well-developed and well-nourished. No distress.  HENT:  Head: Normocephalic and atraumatic.  Mouth/Throat: Oropharynx is clear and moist.  Neck: Normal range of motion. Neck supple.  Cardiovascular: Normal rate and regular rhythm. Exam reveals no friction rub.  No murmur heard. Pulmonary/Chest:  Effort normal and breath sounds normal. No respiratory distress. He has no wheezes. He has no rales.  Abdominal: Soft. Bowel sounds are normal. He exhibits no distension. There is no tenderness.  Genitourinary:  Genitourinary Comments: There is a fecal impaction president of soft brown stool.  Musculoskeletal: Normal range of motion. He exhibits no edema.  Neurological: He is alert and oriented to person, place, and time. Coordination normal.  Skin: Skin is warm and dry. He is not diaphoretic.  Nursing note and vitals reviewed.    ED Treatments / Results  Labs (all labs ordered are listed, but only abnormal results are displayed) Labs Reviewed - No data to display  EKG None  Radiology No results found.  Procedures Procedures (including critical care time)  Medications Ordered in ED Medications  milk and molasses enema (has no administration in time range)     Initial Impression / Assessment and Plan / ED Course  I have reviewed the triage vital signs and the nursing notes.  Pertinent labs & imaging results that were available during my care of the patient were reviewed by me and considered in my medical decision making (see chart for details).  Patient presents with complaints of constipation, fecal impaction, and no bowel movement in the past 2 days.  Rectal examination reveals a fecal impaction.  He was given a fleets enema with good results.  He had a large bowel movement and now feels much better.  His abdomen remains benign.  At this point I feel as though he is appropriate for discharge.  To return as needed for any problems.  Final Clinical Impressions(s) / ED Diagnoses   Final diagnoses:  None    ED Discharge Orders    None       Veryl Speak, MD 02/02/18 1447

## 2018-02-02 NOTE — Discharge Instructions (Addendum)
Magnesium citrate.  Drink the entire 10 ounce bottle mixed with equal parts Sprite or Gatorade for relief of constipation.  Return to the emergency department for severe pain, bleeding, fever, or other new and concerning symptoms.

## 2018-02-05 ENCOUNTER — Encounter (HOSPITAL_COMMUNITY)
Admission: RE | Admit: 2018-02-05 | Discharge: 2018-02-05 | Disposition: A | Discharge status: Home / Self Care | Payer: Medicare HMO | Source: Ambulatory Visit | Attending: Internal Medicine | Admitting: Internal Medicine

## 2018-02-05 DIAGNOSIS — M810 Age-related osteoporosis without current pathological fracture: Secondary | ICD-10-CM | POA: Diagnosis not present

## 2018-02-05 MED ORDER — DENOSUMAB 60 MG/ML ~~LOC~~ SOSY
60.0000 mg | PREFILLED_SYRINGE | Freq: Once | SUBCUTANEOUS | Status: AC
  Administered 2018-02-05: 60 mg via SUBCUTANEOUS

## 2018-02-05 NOTE — Discharge Instructions (Signed)
Denosumab injection °What is this medicine? °DENOSUMAB (den oh sue mab) slows bone breakdown. Prolia is used to treat osteoporosis in women after menopause and in men. Xgeva is used to treat a high calcium level due to cancer and to prevent bone fractures and other bone problems caused by multiple myeloma or cancer bone metastases. Xgeva is also used to treat giant cell tumor of the bone. °This medicine may be used for other purposes; ask your health care provider or pharmacist if you have questions. °COMMON BRAND NAME(S): Prolia, XGEVA °What should I tell my health care provider before I take this medicine? °They need to know if you have any of these conditions: °-dental disease °-having surgery or tooth extraction °-infection °-kidney disease °-low levels of calcium or Vitamin D in the blood °-malnutrition °-on hemodialysis °-skin conditions or sensitivity °-thyroid or parathyroid disease °-an unusual reaction to denosumab, other medicines, foods, dyes, or preservatives °-pregnant or trying to get pregnant °-breast-feeding °How should I use this medicine? °This medicine is for injection under the skin. It is given by a health care professional in a hospital or clinic setting. °If you are getting Prolia, a special MedGuide will be given to you by the pharmacist with each prescription and refill. Be sure to read this information carefully each time. °For Prolia, talk to your pediatrician regarding the use of this medicine in children. Special care may be needed. For Xgeva, talk to your pediatrician regarding the use of this medicine in children. While this drug may be prescribed for children as young as 13 years for selected conditions, precautions do apply. °Overdosage: If you think you have taken too much of this medicine contact a poison control center or emergency room at once. °NOTE: This medicine is only for you. Do not share this medicine with others. °What if I miss a dose? °It is important not to miss your  dose. Call your doctor or health care professional if you are unable to keep an appointment. °What may interact with this medicine? °Do not take this medicine with any of the following medications: °-other medicines containing denosumab °This medicine may also interact with the following medications: °-medicines that lower your chance of fighting infection °-steroid medicines like prednisone or cortisone °This list may not describe all possible interactions. Give your health care provider a list of all the medicines, herbs, non-prescription drugs, or dietary supplements you use. Also tell them if you smoke, drink alcohol, or use illegal drugs. Some items may interact with your medicine. °What should I watch for while using this medicine? °Visit your doctor or health care professional for regular checks on your progress. Your doctor or health care professional may order blood tests and other tests to see how you are doing. °Call your doctor or health care professional for advice if you get a fever, chills or sore throat, or other symptoms of a cold or flu. Do not treat yourself. This drug may decrease your body's ability to fight infection. Try to avoid being around people who are sick. °You should make sure you get enough calcium and vitamin D while you are taking this medicine, unless your doctor tells you not to. Discuss the foods you eat and the vitamins you take with your health care professional. °See your dentist regularly. Brush and floss your teeth as directed. Before you have any dental work done, tell your dentist you are receiving this medicine. °Do not become pregnant while taking this medicine or for 5 months after stopping   it. Talk with your doctor or health care professional about your birth control options while taking this medicine. Women should inform their doctor if they wish to become pregnant or think they might be pregnant. There is a potential for serious side effects to an unborn child. Talk  to your health care professional or pharmacist for more information. What side effects may I notice from receiving this medicine? Side effects that you should report to your doctor or health care professional as soon as possible: -allergic reactions like skin rash, itching or hives, swelling of the face, lips, or tongue -bone pain -breathing problems -dizziness -jaw pain, especially after dental work -redness, blistering, peeling of the skin -signs and symptoms of infection like fever or chills; cough; sore throat; pain or trouble passing urine -signs of low calcium like fast heartbeat, muscle cramps or muscle pain; pain, tingling, numbness in the hands or feet; seizures -unusual bleeding or bruising -unusually weak or tired Side effects that usually do not require medical attention (report to your doctor or health care professional if they continue or are bothersome): -constipation -diarrhea -headache -joint pain -loss of appetite -muscle pain -runny nose -tiredness -upset stomach This list may not describe all possible side effects. Call your doctor for medical advice about side effects. You may report side effects to FDA at 1-800-FDA-1088. Where should I keep my medicine? This medicine is only given in a clinic, doctor's office, or other health care setting and will not be stored at home. NOTE: This sheet is a summary. It may not cover all possible information. If you have questions about this medicine, talk to your doctor, pharmacist, or health care provider.  2018 Elsevier/Gold Standard (2016-04-02 19:17:21)

## 2018-02-08 IMAGING — XA IR BILIARY CATHETER EXCHANGE
1 series · 2 of 2 positions shown · non-contrast
Comparison: 03/14/2016

INDICATION: Exchange required a of chronic right-sided indwelling nephroureteral
drainage catheter via ileostomy.

EXAM:
PERC TUBE CHG W/CM

[Series 300: tube placements · 2 of 2 slices shown]
[im 1/2]
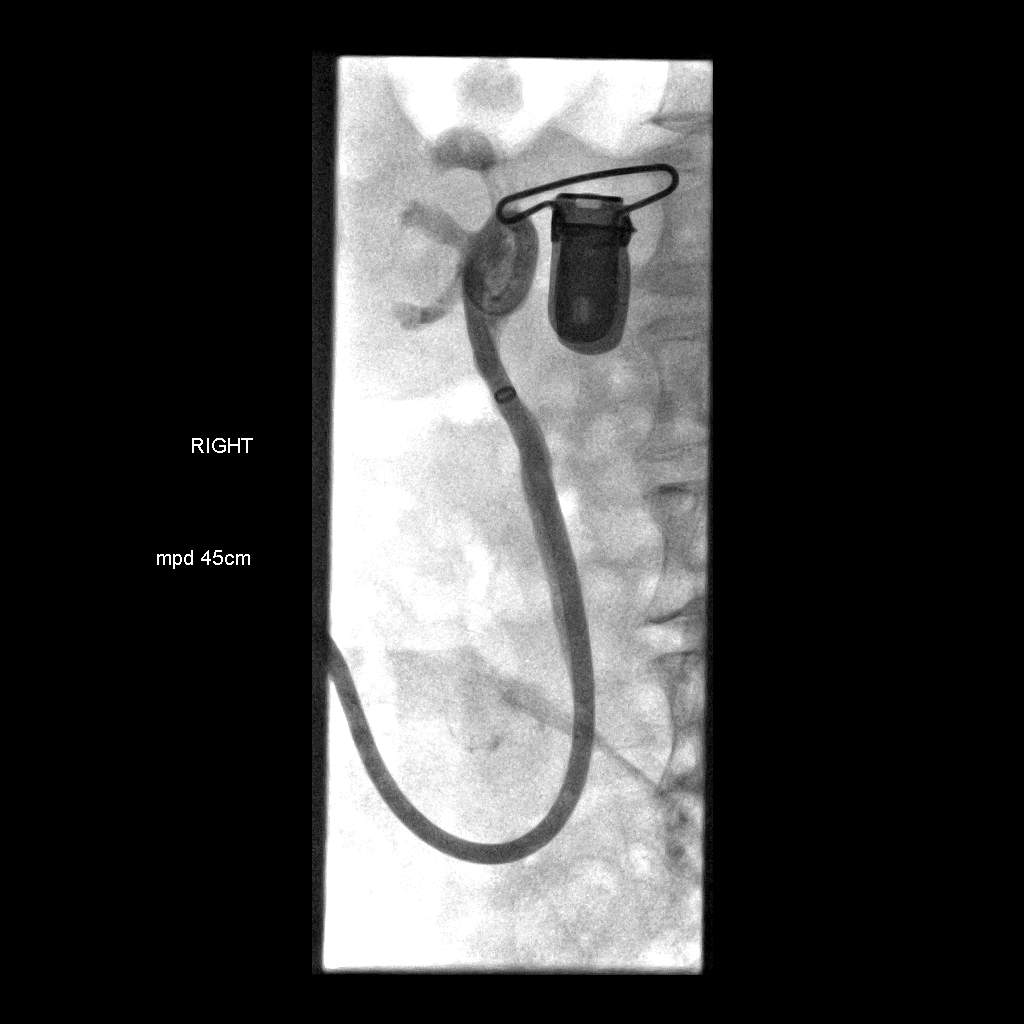
[im 2/2]
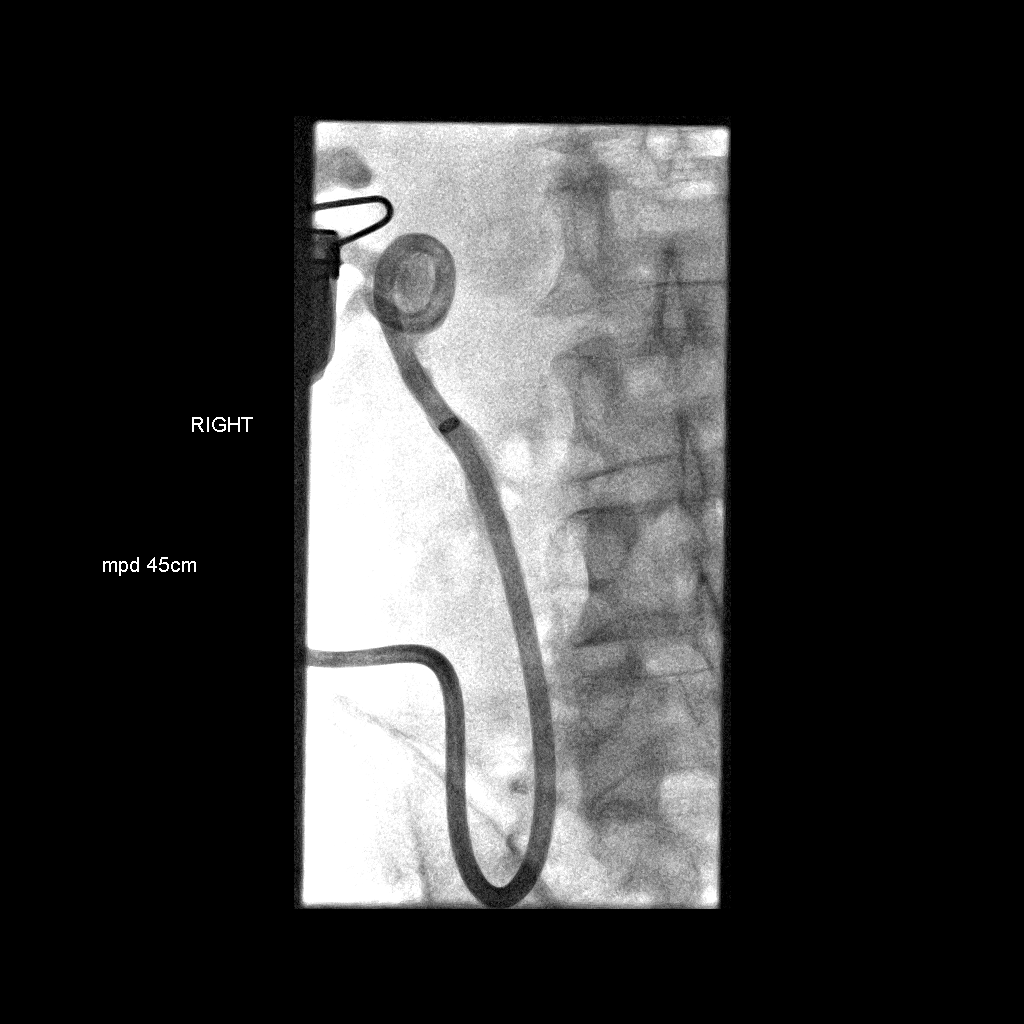

[2 of 2 positions shown; findings below may reference images not displayed]

MEDICATIONS:
None

ANESTHESIA/SEDATION:
None

CONTRAST:  10mL 3RDYLE-4ZZ IOPAMIDOL (3RDYLE-4ZZ) INJECTION 61% -
administered into the collecting system(s)

FLUOROSCOPY TIME:  Fluoroscopy Time: 1 minutes and 42 seconds. 31
mGy.

COMPLICATIONS:
None immediate.

PROCEDURE:
Informed written consent was obtained from the patient after a
thorough discussion of the procedural risks, benefits and
alternatives. All questions were addressed. Maximal Sterile Barrier
Technique was utilized including caps, mask, sterile gowns, sterile
gloves, sterile drape, hand hygiene and skin antiseptic. A timeout
was performed prior to the initiation of the procedure.

The pre-existing catheter was injected with contrast material. It
was then cut and removed over a guidewire. A new 14 French catheter
was advanced over the wire. The catheter was formed and injected
with contrast material to confirm position.
FINDINGS: The new catheter was placed such that the tip is formed in the renal
pelvis.
IMPRESSION: Replacement of chronic indwelling 14 French right-sided
nephroureteral drainage catheter via ileostomy.

## 2018-02-10 ENCOUNTER — Ambulatory Visit (HOSPITAL_COMMUNITY)
Admission: RE | Admit: 2018-02-10 | Discharge: 2018-02-10 | Disposition: A | Discharge status: Home / Self Care | Payer: Medicare HMO | Source: Ambulatory Visit | Attending: Urology | Admitting: Urology

## 2018-02-10 ENCOUNTER — Other Ambulatory Visit: Payer: Self-pay | Admitting: Urology

## 2018-02-10 DIAGNOSIS — N135 Crossing vessel and stricture of ureter without hydronephrosis: Secondary | ICD-10-CM

## 2018-02-10 DIAGNOSIS — Z436 Encounter for attention to other artificial openings of urinary tract: Secondary | ICD-10-CM | POA: Insufficient documentation

## 2018-02-10 DIAGNOSIS — Z936 Other artificial openings of urinary tract status: Secondary | ICD-10-CM | POA: Diagnosis not present

## 2018-02-10 DIAGNOSIS — C679 Malignant neoplasm of bladder, unspecified: Secondary | ICD-10-CM | POA: Diagnosis not present

## 2018-02-10 HISTORY — PX: IR NEPHROSTOMY EXCHANGE RIGHT: IMG6070

## 2018-02-10 MED ORDER — LIDOCAINE HCL (PF) 1 % IJ SOLN
INTRAMUSCULAR | Status: DC | PRN
  Administered 2018-02-10: 5 mL

## 2018-02-10 MED ORDER — IOHEXOL 300 MG/ML  SOLN
50.0000 mL | Freq: Once | INTRAMUSCULAR | Status: AC | PRN
  Administered 2018-02-10: 10 mL via INTRAVENOUS

## 2018-02-10 MED ORDER — IOPAMIDOL (ISOVUE-300) INJECTION 61%
INTRAVENOUS | Status: AC
  Filled 2018-02-10: qty 50

## 2018-02-10 MED ORDER — LIDOCAINE HCL 1 % IJ SOLN
INTRAMUSCULAR | Status: AC
  Filled 2018-02-10: qty 20

## 2018-02-11 ENCOUNTER — Encounter (HOSPITAL_COMMUNITY): Payer: Self-pay | Admitting: Interventional Radiology

## 2018-03-12 DIAGNOSIS — C679 Malignant neoplasm of bladder, unspecified: Secondary | ICD-10-CM | POA: Diagnosis not present

## 2018-03-12 DIAGNOSIS — Z936 Other artificial openings of urinary tract status: Secondary | ICD-10-CM | POA: Diagnosis not present

## 2018-03-15 IMAGING — XA IR BILIARY CATHETER EXCHANGE
1 series · 3 of 3 positions shown · non-contrast
Comparison: none

CLINICAL DATA: Bladder carcinoma, post ileal conduit creation with
ureteral stricture, requiring chronic long-term indwelling
retrograde nephroureteral catheter. Presents for routine exchange.
No current issues.

EXAM:
Percutaneous retrograde nephroureteral CATHETER EXCHANGE UNDER
FLUOROSCOPY
FLUOROSCOPY TIME:  5.8 minutes ([id number] DAP)
TECHNIQUE: The external portion of the nephroureteral catheter, ostomy and
surrounding skin were prepped with Betadine, draped in usual sterile
fashion.

[Series 300: ir nephrostomy tube change · 3 of 3 slices shown]
[im 1/3]
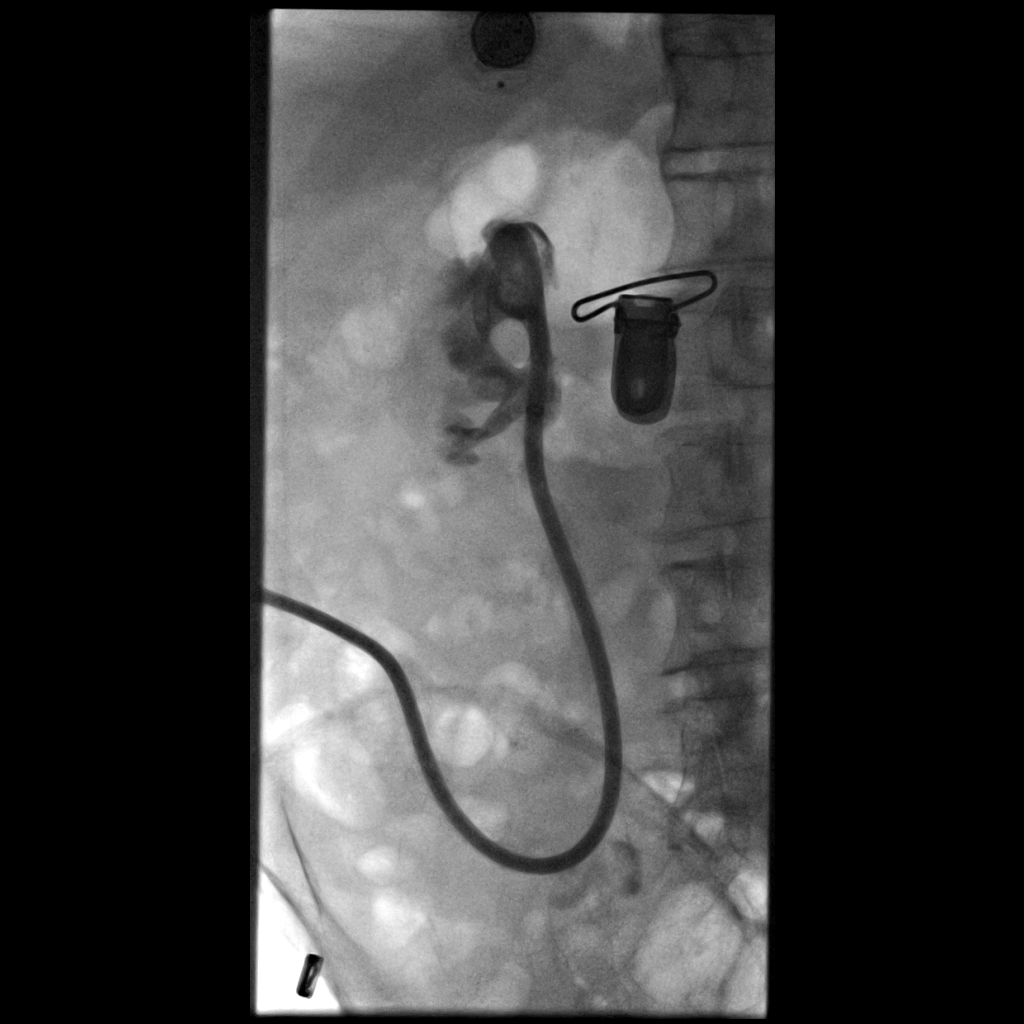
[im 2/3]
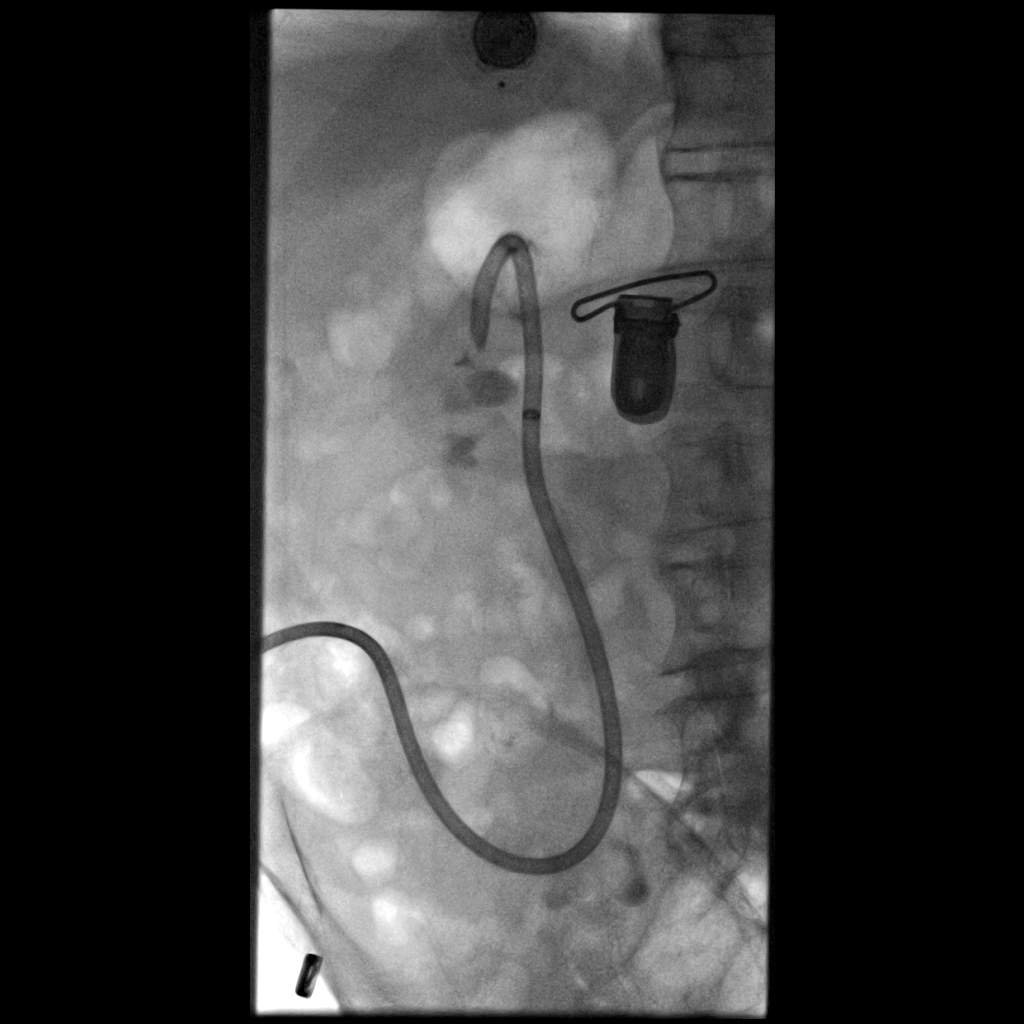
[im 3/3]
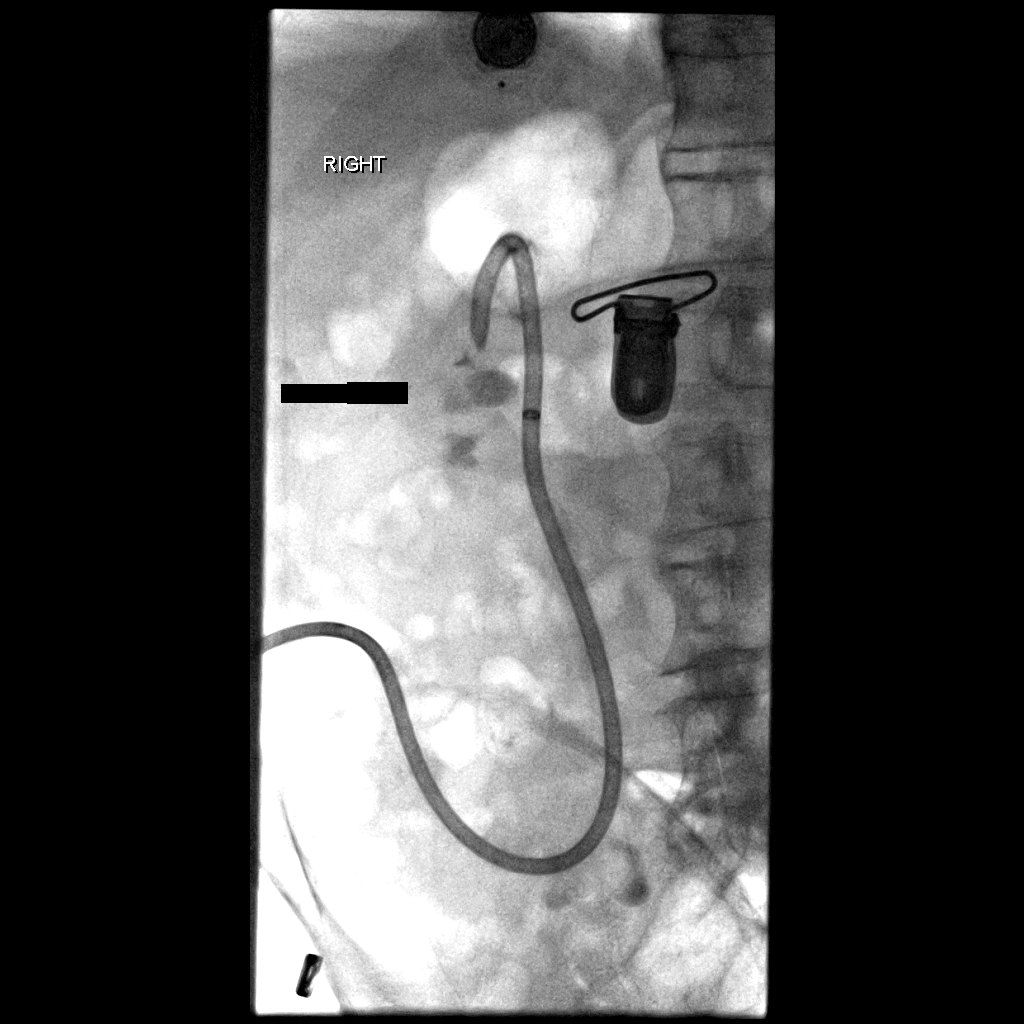

[3 of 3 positions shown; findings below may reference images not displayed]

A small amount of contrast was attempted to be injected through the
right nephrostomy catheter to opacify the renal collecting system,
but intraluminal debris prevented adequate injection. The catheter
was cut for exchange although the guidewire would not advance all
the way through the lumen. For this reason, a 5 French angiographic
catheter was placed coaxially through the drain catheter and
ultimately an angled stiff hydrophilic Glidewire advanced to the
renal collecting system. The old catheter was removed over the
guidewire. A new 14 French pigtail catheter was advanced and
positioned centrally in the renal collecting system. The small bifid
collecting system bone not allow adequate forming of the pigtail
catheter which was partially extended up into the an upper pole
collecting system. Contrast injection confirms adequate positioning.
The ostomy device was placed. The patient tolerated the procedure
well.

COMPLICATIONS:
None.
IMPRESSION: 1. Technically successful exchange of 14 French retrograde
nephroureteral catheter under fluoroscopy.

We will order a long 12 Kasthiana Ceriak nephroureteral
catheter, if available, which may be a more appropriate fit for the
patient's anatomy.

## 2018-03-23 ENCOUNTER — Other Ambulatory Visit: Payer: Self-pay | Admitting: Urology

## 2018-03-23 ENCOUNTER — Ambulatory Visit (HOSPITAL_COMMUNITY)
Admission: RE | Admit: 2018-03-23 | Discharge: 2018-03-23 | Disposition: A | Discharge status: Home / Self Care | Payer: Medicare HMO | Source: Ambulatory Visit | Attending: Urology | Admitting: Urology

## 2018-03-23 ENCOUNTER — Encounter (HOSPITAL_COMMUNITY): Payer: Self-pay | Admitting: Interventional Radiology

## 2018-03-23 DIAGNOSIS — N135 Crossing vessel and stricture of ureter without hydronephrosis: Secondary | ICD-10-CM

## 2018-03-23 DIAGNOSIS — Z436 Encounter for attention to other artificial openings of urinary tract: Secondary | ICD-10-CM | POA: Insufficient documentation

## 2018-03-23 HISTORY — PX: IR NEPHROSTOMY EXCHANGE RIGHT: IMG6070

## 2018-03-23 MED ORDER — IOPAMIDOL (ISOVUE-300) INJECTION 61%
50.0000 mL | Freq: Once | INTRAVENOUS | Status: AC | PRN
  Administered 2018-03-23: 10 mL

## 2018-03-23 MED ORDER — LIDOCAINE HCL 1 % IJ SOLN
INTRAMUSCULAR | Status: AC
  Filled 2018-03-23: qty 20

## 2018-03-23 MED ORDER — IOPAMIDOL (ISOVUE-300) INJECTION 61%
INTRAVENOUS | Status: AC
  Administered 2018-03-23: 10 mL
  Filled 2018-03-23: qty 50

## 2018-03-23 NOTE — Procedures (Signed)
Pre Procedure Dx: Hydronephrosis Post Procedure Dx: Same  Successful right sided PCN exchange.    EBL: None   No immediate complications.   Jay Taris Galindo, MD Pager #: [phone]   

## 2018-04-17 DIAGNOSIS — I10 Essential (primary) hypertension: Secondary | ICD-10-CM | POA: Diagnosis not present

## 2018-04-17 DIAGNOSIS — I350 Nonrheumatic aortic (valve) stenosis: Secondary | ICD-10-CM | POA: Diagnosis not present

## 2018-04-17 DIAGNOSIS — N184 Chronic kidney disease, stage 4 (severe): Secondary | ICD-10-CM | POA: Diagnosis not present

## 2018-04-17 DIAGNOSIS — M859 Disorder of bone density and structure, unspecified: Secondary | ICD-10-CM | POA: Diagnosis not present

## 2018-04-17 DIAGNOSIS — Z6825 Body mass index (BMI) 25.0-25.9, adult: Secondary | ICD-10-CM | POA: Diagnosis not present

## 2018-04-17 DIAGNOSIS — C679 Malignant neoplasm of bladder, unspecified: Secondary | ICD-10-CM | POA: Diagnosis not present

## 2018-04-17 DIAGNOSIS — Z936 Other artificial openings of urinary tract status: Secondary | ICD-10-CM | POA: Diagnosis not present

## 2018-04-17 DIAGNOSIS — C61 Malignant neoplasm of prostate: Secondary | ICD-10-CM | POA: Diagnosis not present

## 2018-04-19 IMAGING — XA IR BILIARY CATHETER EXCHANGE
1 series · 3 of 3 positions shown · non-contrast
Comparison: none

INDICATION: Prostate and bladder cancer with an ileal conduit. Right ureter
stricture and a chronic right nephroureteral catheter. Patient
presents for 5 week routine exchange.

[Series 300: tube placements · 3 of 3 slices shown]
[im 1/3]
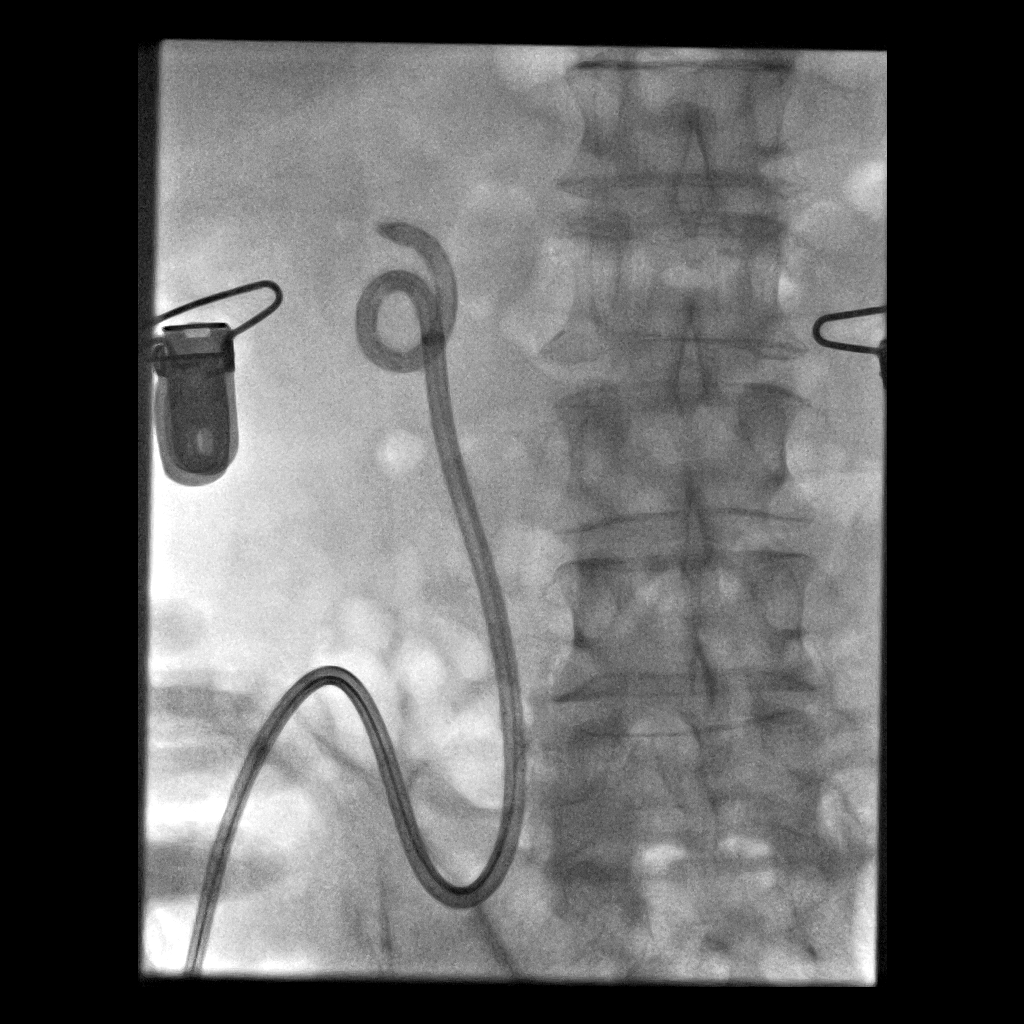
[im 2/3]
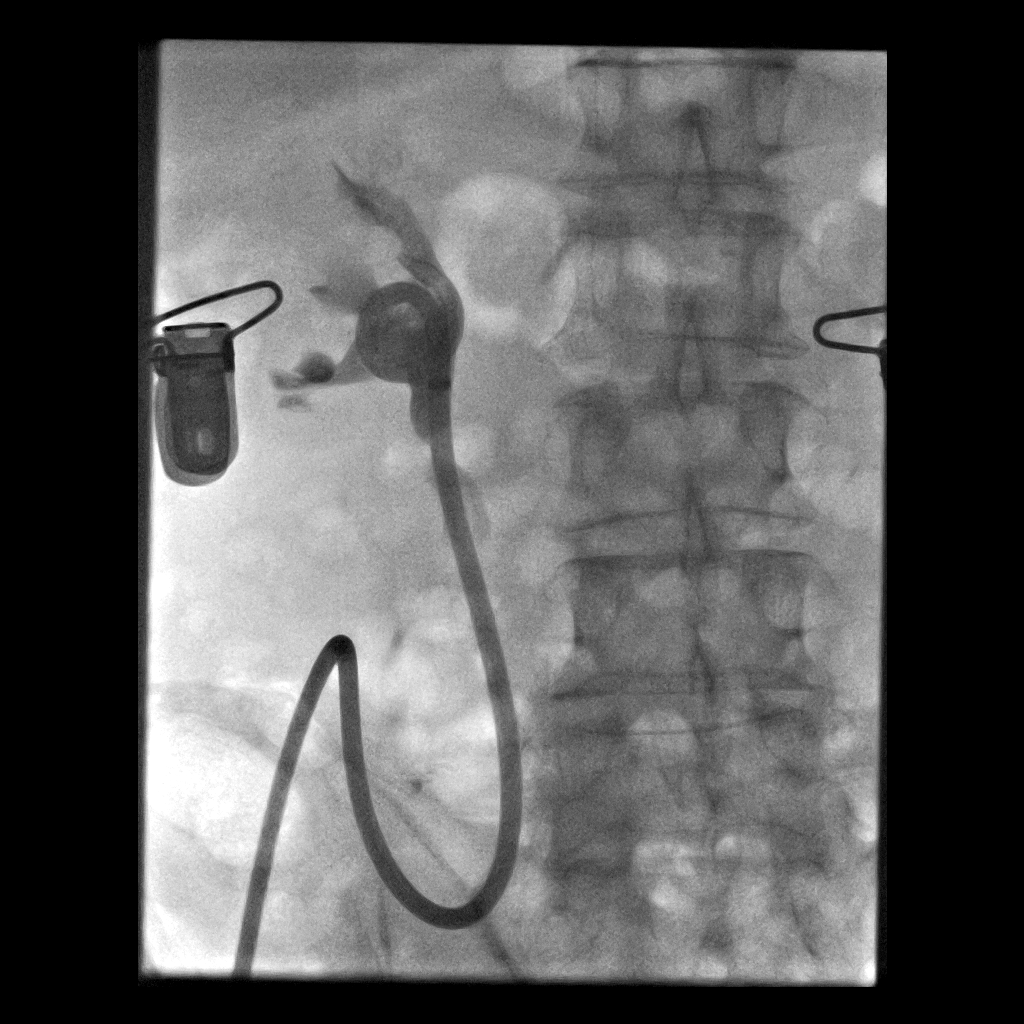
[im 3/3]
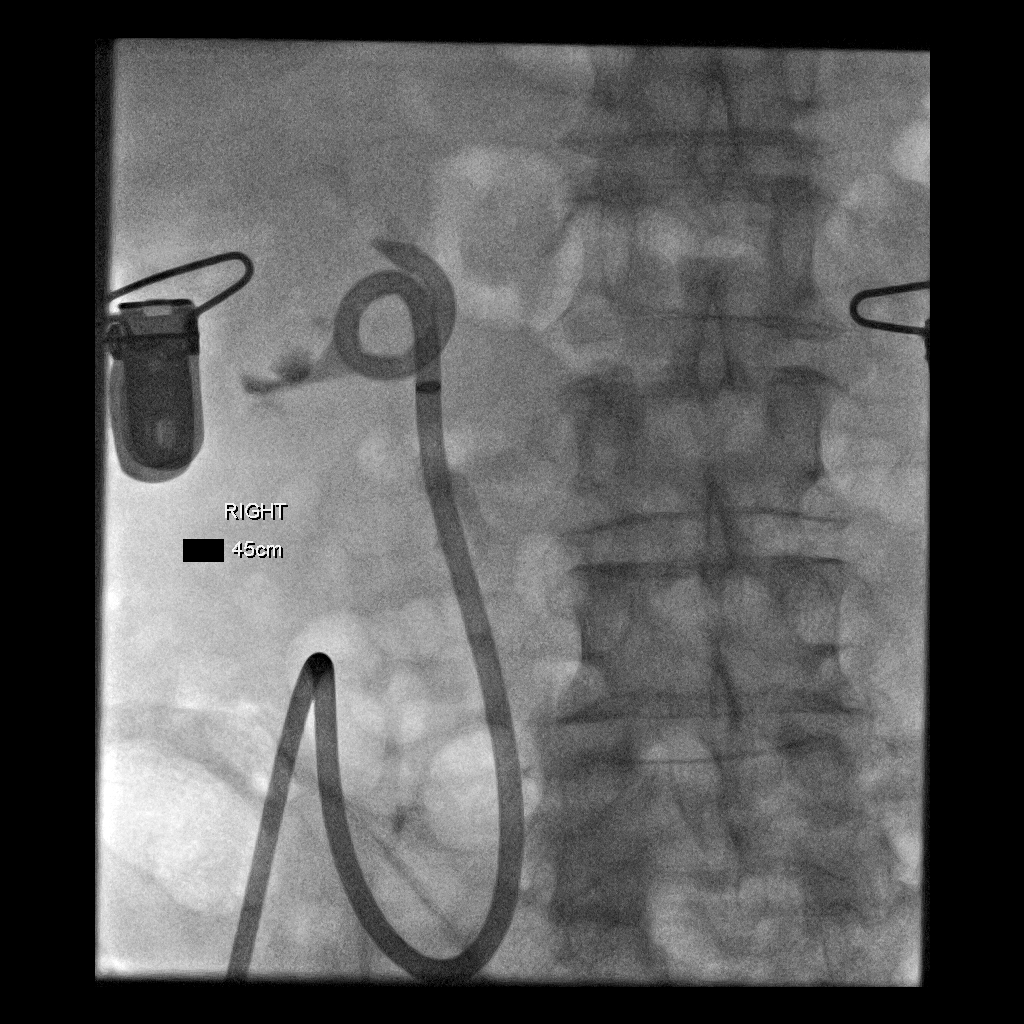

[3 of 3 positions shown; findings below may reference images not displayed]

EXAM:
EXCHANGE OF THE RIGHT NEPHROURETERAL CATHETER WITH FLUOROSCOPY

MEDICATIONS:
None

ANESTHESIA/SEDATION:
None

CONTRAST:  10 mL Dsovue-1FF - administered into the collecting
system(s)

FLUOROSCOPY TIME:  Fluoroscopy Time: 1 minute and 6 seconds.
mGy

COMPLICATIONS:
None immediate.

PROCEDURE:
The procedure was explained to the patient. The risks and benefits
of the procedure were discussed and the patient's questions were
addressed. Informed consent was obtained from the patient. The
existing catheter was prepped and draped in a sterile fashion.
Maximal barrier sterile technique was utilized including caps, mask,
sterile gowns, sterile gloves, sterile drape, hand hygiene and skin
antiseptic. There was sediment within the existing catheter which
was cleared with aspiration and flushing. Contrast injection
confirmed placement in the right renal collecting system. The
catheter was cut and removed over a stiff Amplatz wire. A new 14
French catheter was advanced over the wire and positioned in the
right renal pelvis. Contrast injection was performed to confirm
placement. Catheter was flushed normal saline.

Fluoroscopic images were taken and saved for this procedure.
FINDINGS: New catheter in the right renal pelvis.
IMPRESSION: Successful exchange of the right nephroureteral catheter via the
ileal conduit.

## 2018-05-04 ENCOUNTER — Encounter (HOSPITAL_COMMUNITY): Payer: Self-pay | Admitting: Interventional Radiology

## 2018-05-04 ENCOUNTER — Other Ambulatory Visit: Payer: Self-pay | Admitting: Urology

## 2018-05-04 ENCOUNTER — Ambulatory Visit (HOSPITAL_COMMUNITY)
Admission: RE | Admit: 2018-05-04 | Discharge: 2018-05-04 | Disposition: A | Discharge status: Home / Self Care | Payer: Medicare HMO | Source: Ambulatory Visit | Attending: Urology | Admitting: Urology

## 2018-05-04 DIAGNOSIS — N135 Crossing vessel and stricture of ureter without hydronephrosis: Secondary | ICD-10-CM | POA: Diagnosis not present

## 2018-05-04 DIAGNOSIS — Z436 Encounter for attention to other artificial openings of urinary tract: Secondary | ICD-10-CM | POA: Diagnosis not present

## 2018-05-04 HISTORY — PX: IR NEPHROSTOMY EXCHANGE RIGHT: IMG6070

## 2018-05-04 MED ORDER — LIDOCAINE HCL 1 % IJ SOLN
INTRAMUSCULAR | Status: AC
  Filled 2018-05-04: qty 20

## 2018-05-04 MED ORDER — IOPAMIDOL (ISOVUE-300) INJECTION 61%
50.0000 mL | Freq: Once | INTRAVENOUS | Status: AC | PRN
  Administered 2018-05-04: 10 mL

## 2018-05-04 MED ORDER — IOPAMIDOL (ISOVUE-300) INJECTION 61%
INTRAVENOUS | Status: AC
  Administered 2018-05-04: 10 mL
  Filled 2018-05-04: qty 50

## 2018-05-04 NOTE — Procedures (Signed)
Pre Procedure Dx: Hydronephrosis Post Procedure Dx: Same  Successful right sided PCN exchange.    EBL: None   No immediate complications.   Ronny Bacon, MD Pager #: [phone]

## 2018-05-11 ENCOUNTER — Encounter (INDEPENDENT_AMBULATORY_CARE_PROVIDER_SITE_OTHER): Payer: Medicare HMO | Admitting: Ophthalmology

## 2018-05-18 ENCOUNTER — Encounter (INDEPENDENT_AMBULATORY_CARE_PROVIDER_SITE_OTHER): Payer: Medicare HMO | Admitting: Ophthalmology

## 2018-05-20 ENCOUNTER — Encounter (INDEPENDENT_AMBULATORY_CARE_PROVIDER_SITE_OTHER): Payer: Medicare HMO | Admitting: Ophthalmology

## 2018-05-20 DIAGNOSIS — H353221 Exudative age-related macular degeneration, left eye, with active choroidal neovascularization: Secondary | ICD-10-CM

## 2018-05-20 DIAGNOSIS — H35033 Hypertensive retinopathy, bilateral: Secondary | ICD-10-CM | POA: Diagnosis not present

## 2018-05-20 DIAGNOSIS — I10 Essential (primary) hypertension: Secondary | ICD-10-CM | POA: Diagnosis not present

## 2018-05-20 DIAGNOSIS — H43813 Vitreous degeneration, bilateral: Secondary | ICD-10-CM | POA: Diagnosis not present

## 2018-05-20 DIAGNOSIS — H353114 Nonexudative age-related macular degeneration, right eye, advanced atrophic with subfoveal involvement: Secondary | ICD-10-CM

## 2018-05-24 IMAGING — XA IR BILIARY CATHETER EXCHANGE
1 series · 4 of 4 positions shown · non-contrast
Comparison: 07/01/2016

INDICATION: Exchange of chronic right-sided indwelling nephroureteral drainage
catheter via ileostomy.

EXAM:
PERC TUBE CHG W/CM

[Series 300: tube placements · 4 of 4 slices shown]
[im 1/4]
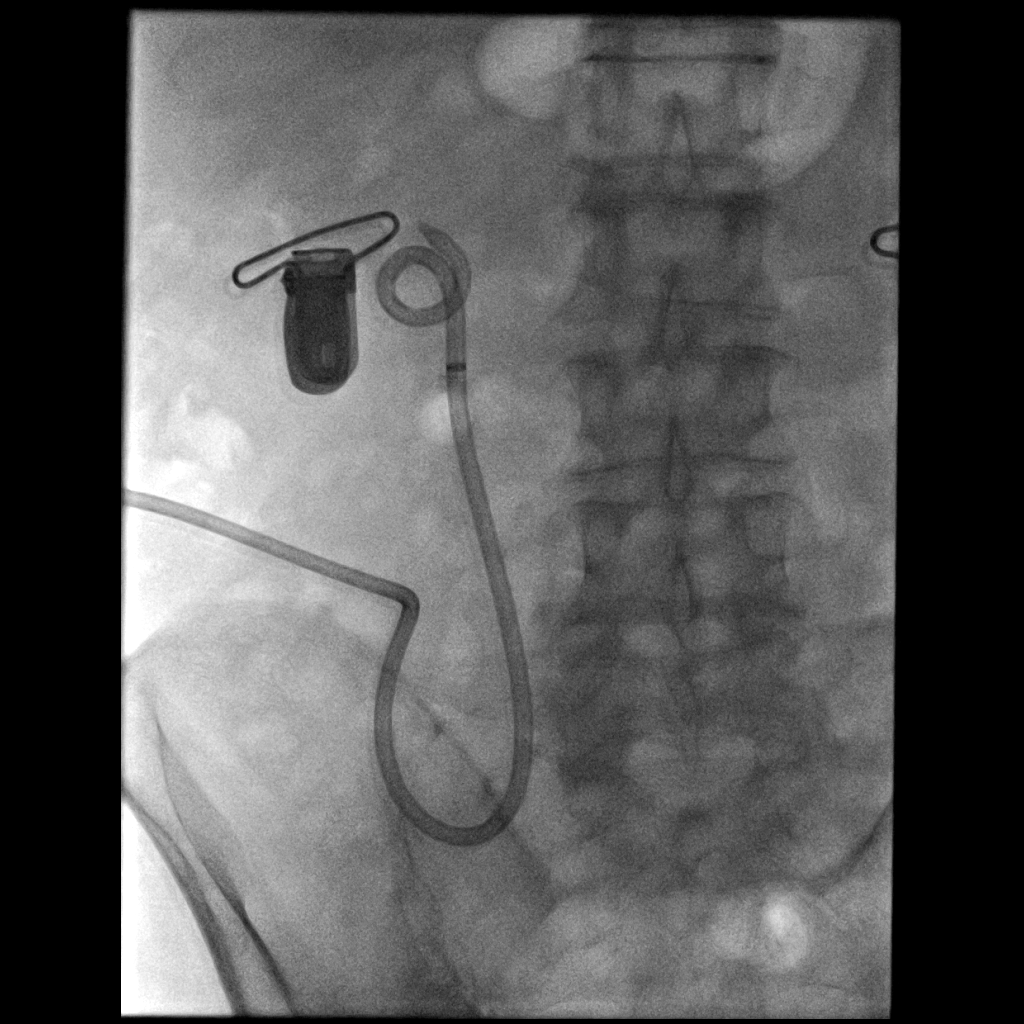
[im 2/4]
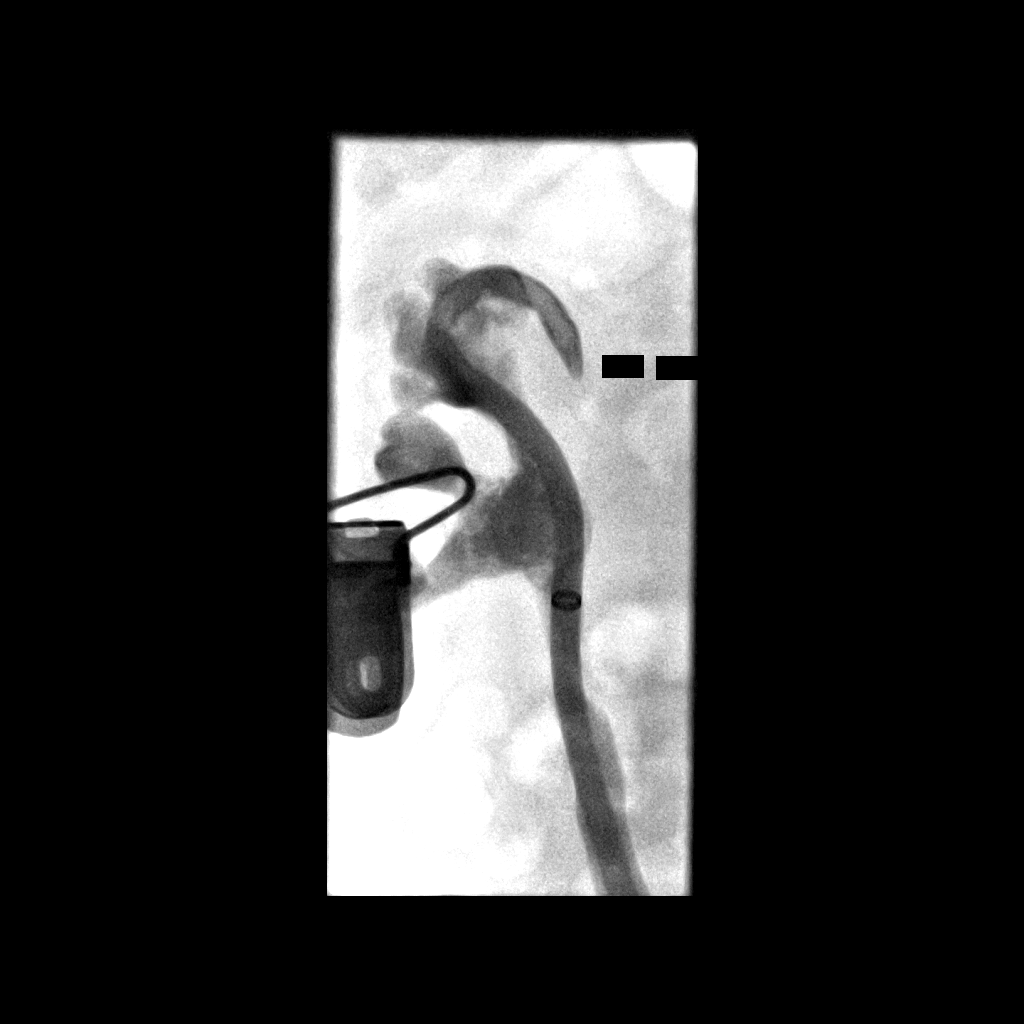
[im 3/4]
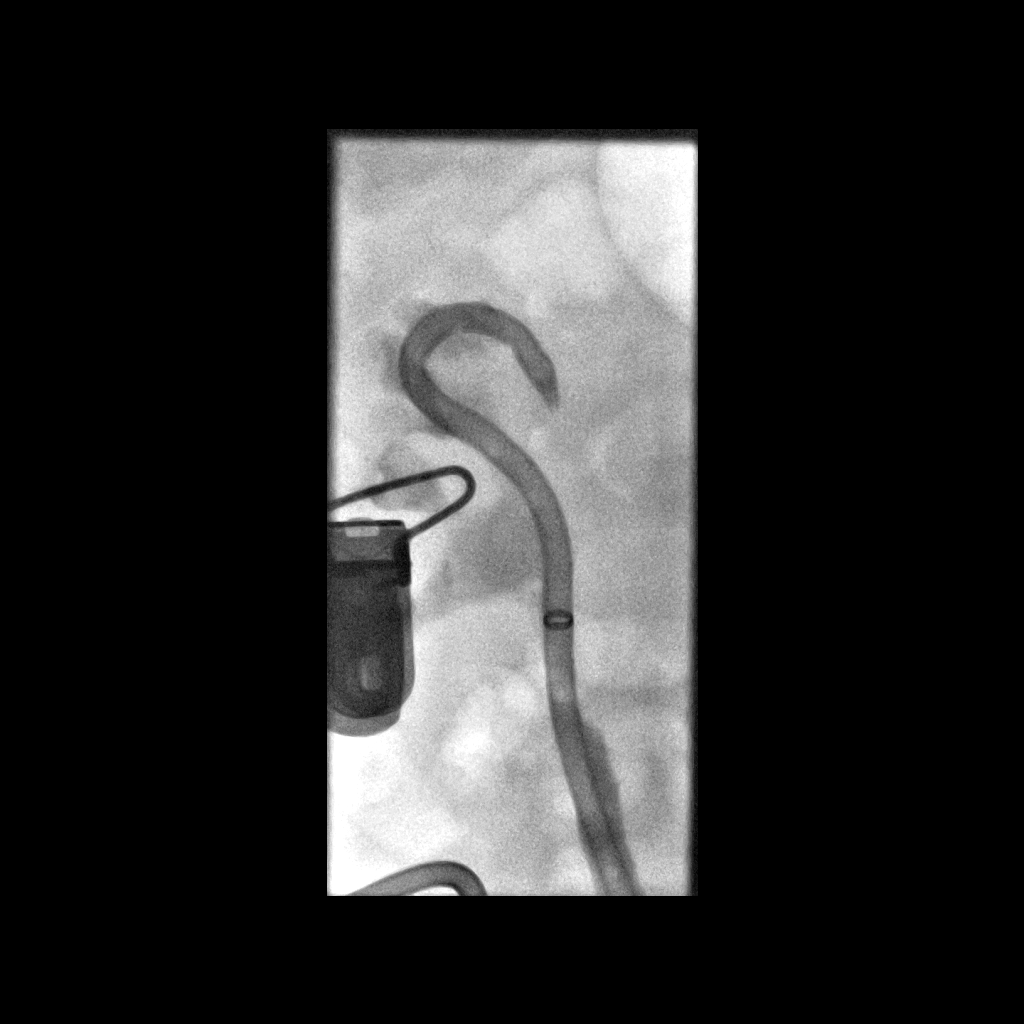
[im 4/4]
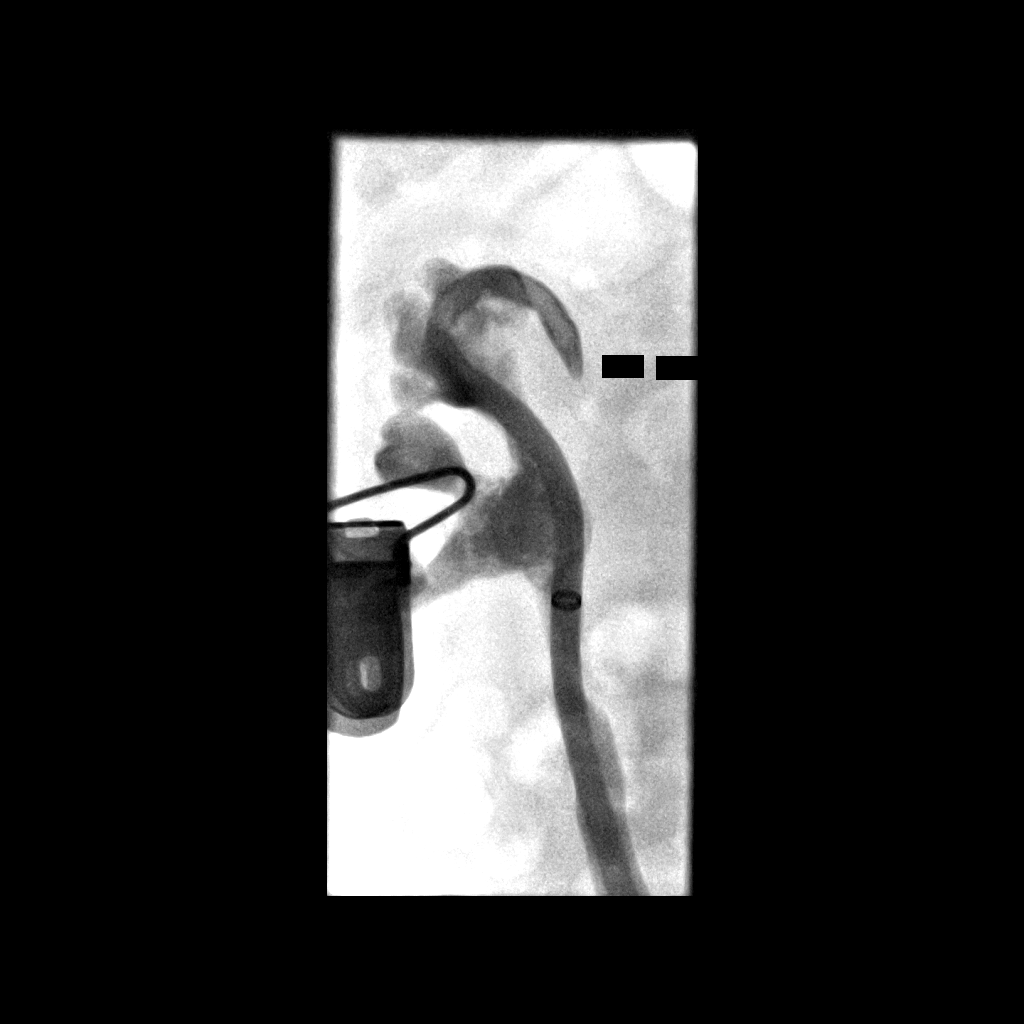

[4 of 4 positions shown; findings below may reference images not displayed]

MEDICATIONS:
None

ANESTHESIA/SEDATION:
None

CONTRAST:  20mL CIGBZ7-DCC IOPAMIDOL (CIGBZ7-DCC) INJECTION 61% -
administered into the collecting system(s)

FLUOROSCOPY TIME:  Fluoroscopy Time: 3 minutes and 24 seconds. 60
mGy.

COMPLICATIONS:
None immediate.

PROCEDURE:
Informed written consent was obtained from the patient after a
thorough discussion of the procedural risks, benefits and
alternatives. All questions were addressed. Maximal Sterile Barrier
Technique was utilized including caps, mask, sterile gowns, sterile
gloves, sterile drape, hand hygiene and skin antiseptic. A timeout
was performed prior to the initiation of the procedure.

The indwelling 14 French catheter was injected with contrast
material. It was then removed over a guidewire. A new 14 French
catheter was then advanced through the ileostomy over the wire. The
catheter was formed and injected with contrast material. A
fluoroscopic spot image was obtained.
FINDINGS: The pre-existing catheter was exchanged for a new catheter which was
formed in the upper collecting system.
IMPRESSION: Exchange of chronic indwelling 14 French right-sided nephroureteral
drainage catheter via ileostomy.

## 2018-05-27 DIAGNOSIS — Z936 Other artificial openings of urinary tract status: Secondary | ICD-10-CM | POA: Diagnosis not present

## 2018-05-27 DIAGNOSIS — C679 Malignant neoplasm of bladder, unspecified: Secondary | ICD-10-CM | POA: Diagnosis not present

## 2018-06-12 ENCOUNTER — Encounter: Payer: Medicare HMO | Admitting: Physician Assistant

## 2018-06-22 ENCOUNTER — Other Ambulatory Visit (HOSPITAL_COMMUNITY): Payer: Medicare HMO

## 2018-06-25 ENCOUNTER — Other Ambulatory Visit: Payer: Self-pay

## 2018-06-25 ENCOUNTER — Encounter (HOSPITAL_COMMUNITY): Payer: Self-pay | Admitting: Interventional Radiology

## 2018-06-25 ENCOUNTER — Ambulatory Visit (HOSPITAL_COMMUNITY)
Admission: RE | Admit: 2018-06-25 | Discharge: 2018-06-25 | Disposition: A | Discharge status: Home / Self Care | Payer: Medicare HMO | Source: Ambulatory Visit | Attending: Urology | Admitting: Urology

## 2018-06-25 ENCOUNTER — Other Ambulatory Visit: Payer: Self-pay | Admitting: Urology

## 2018-06-25 DIAGNOSIS — N135 Crossing vessel and stricture of ureter without hydronephrosis: Secondary | ICD-10-CM | POA: Diagnosis not present

## 2018-06-25 DIAGNOSIS — Z436 Encounter for attention to other artificial openings of urinary tract: Secondary | ICD-10-CM | POA: Diagnosis present

## 2018-06-25 HISTORY — PX: IR NEPHROSTOMY EXCHANGE RIGHT: IMG6070

## 2018-06-25 MED ORDER — SODIUM CHLORIDE 0.9% FLUSH: 5.0000 mL | Freq: Three times a day (TID) | INTRAVENOUS | Status: DC

## 2018-06-25 MED ORDER — IOHEXOL 300 MG/ML  SOLN
50.0000 mL | Freq: Once | INTRAMUSCULAR | Status: AC | PRN
  Administered 2018-06-25: 10 mL

## 2018-06-25 MED ORDER — LIDOCAINE HCL 1 % IJ SOLN
INTRAMUSCULAR | Status: AC
  Filled 2018-06-25: qty 20

## 2018-06-25 NOTE — Procedures (Signed)
Interventional Radiology Procedure Note  Procedure: Routine exchange of right PCN, with new 38F pigtail..  Complications: None Recommendations:  - To gravity - Do not submerge - Routine care   Signed,  Dulcy Fanny. Earleen Newport, DO

## 2018-07-07 IMAGING — XA IR BILIARY CATHETER EXCHANGE
3 series · 11 of 11 positions shown · non-contrast
Comparison: none

CLINICAL DATA: Bladder carcinoma, post ileal conduit creation with
ureteral stricture, requiring chronic long-term indwelling
retrograde nephroureteral catheter. Presents for routine exchange.
No current issues.

EXAM:
PERCUTANEOUS RETROGRADE NEPHROURETERAL CATHETER EXCHANGE UNDER
FLUOROSCOPY
FLUOROSCOPY TIME:  2 minutes 42 seconds, 38 mGy
TECHNIQUE: The external portion of the nephroureteral catheter, ostomy and
surrounding skin were prepped with Betadine, draped in usual sterile
fashion.

[Series 1: fl - angio · 4 of 48 frames shown (1 of 2)]
[frame 8/48]
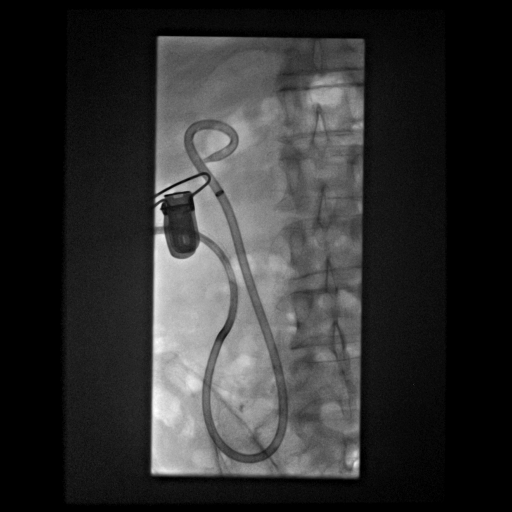
[frame 18/48]
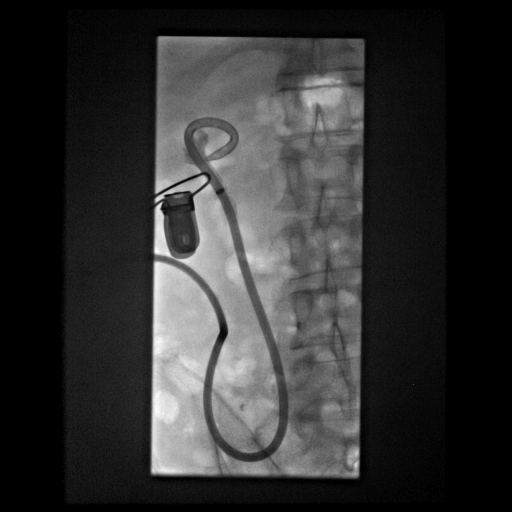
[frame 25/48]
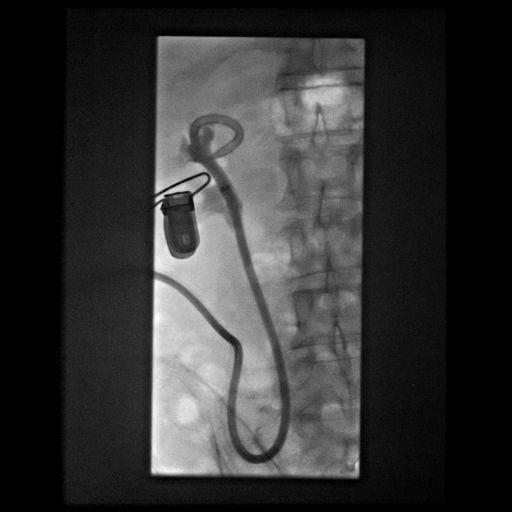
[frame 41/48]
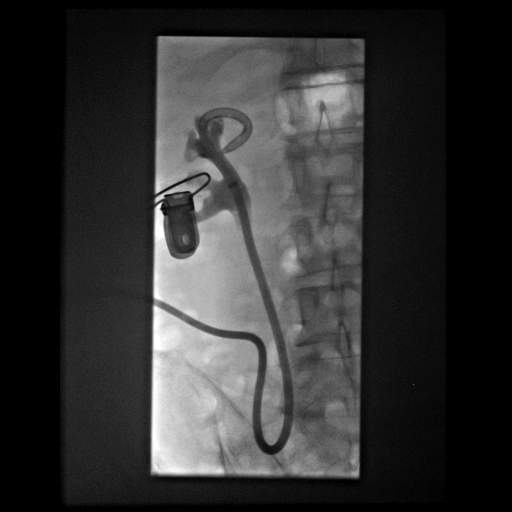

[Series 2: fl - angio · 4 of 35 frames shown (2 of 2)]
[frame 6/35]
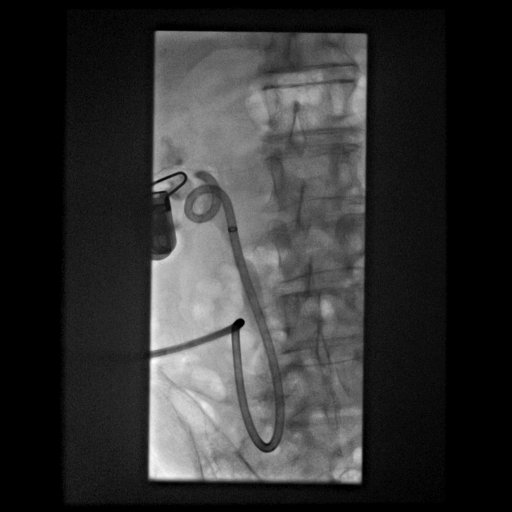
[frame 8/35]
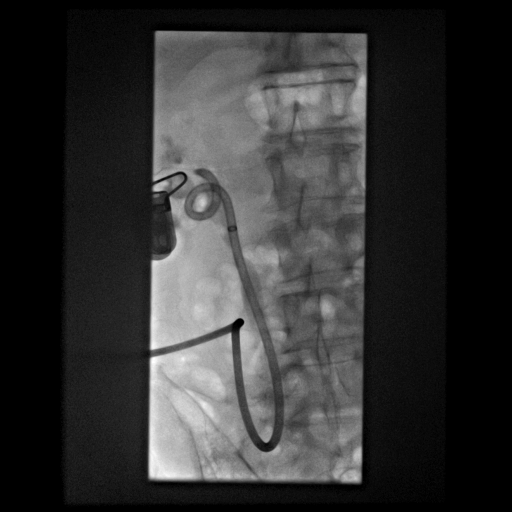
[frame 18/35]
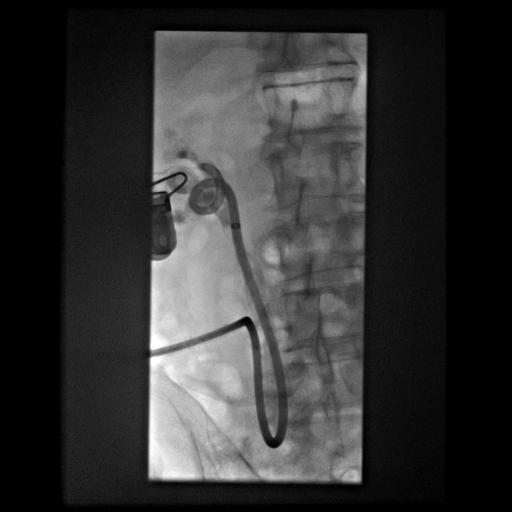
[frame 30/35]
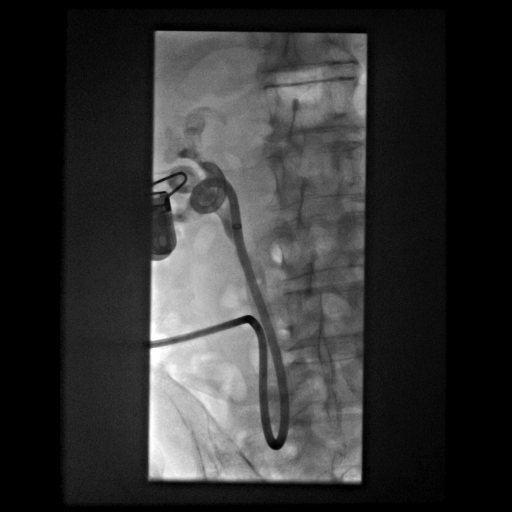

[Series 300: tube placements · 3 of 3 slices shown]
[im 1/3]
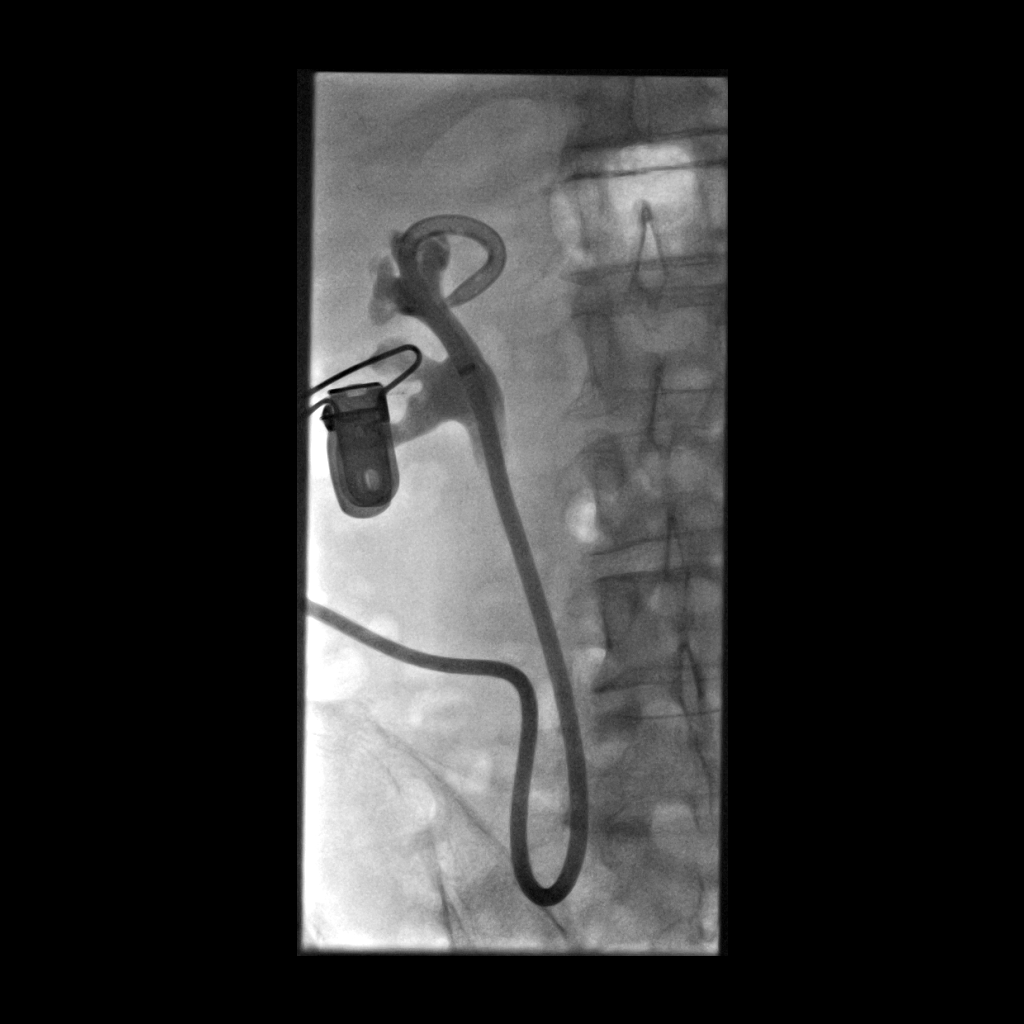
[im 2/3]
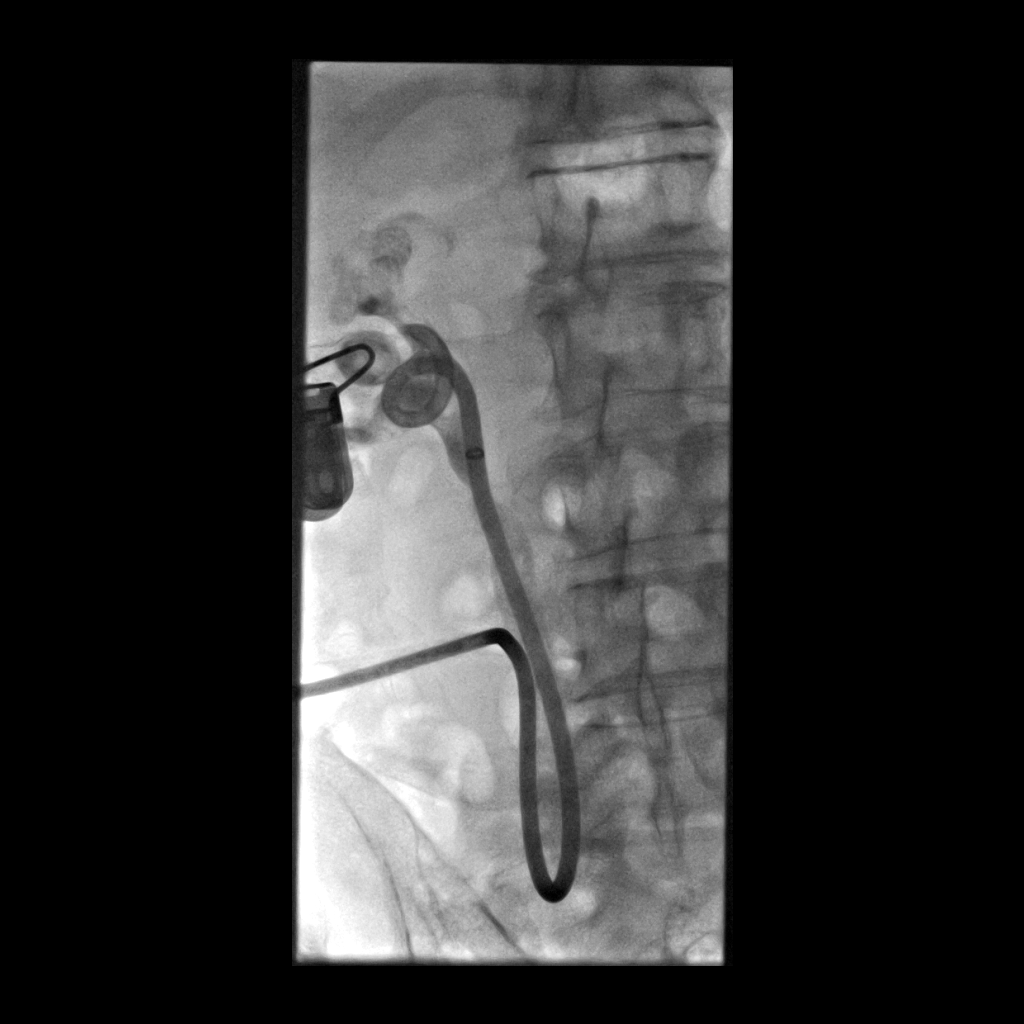
[im 3/3]
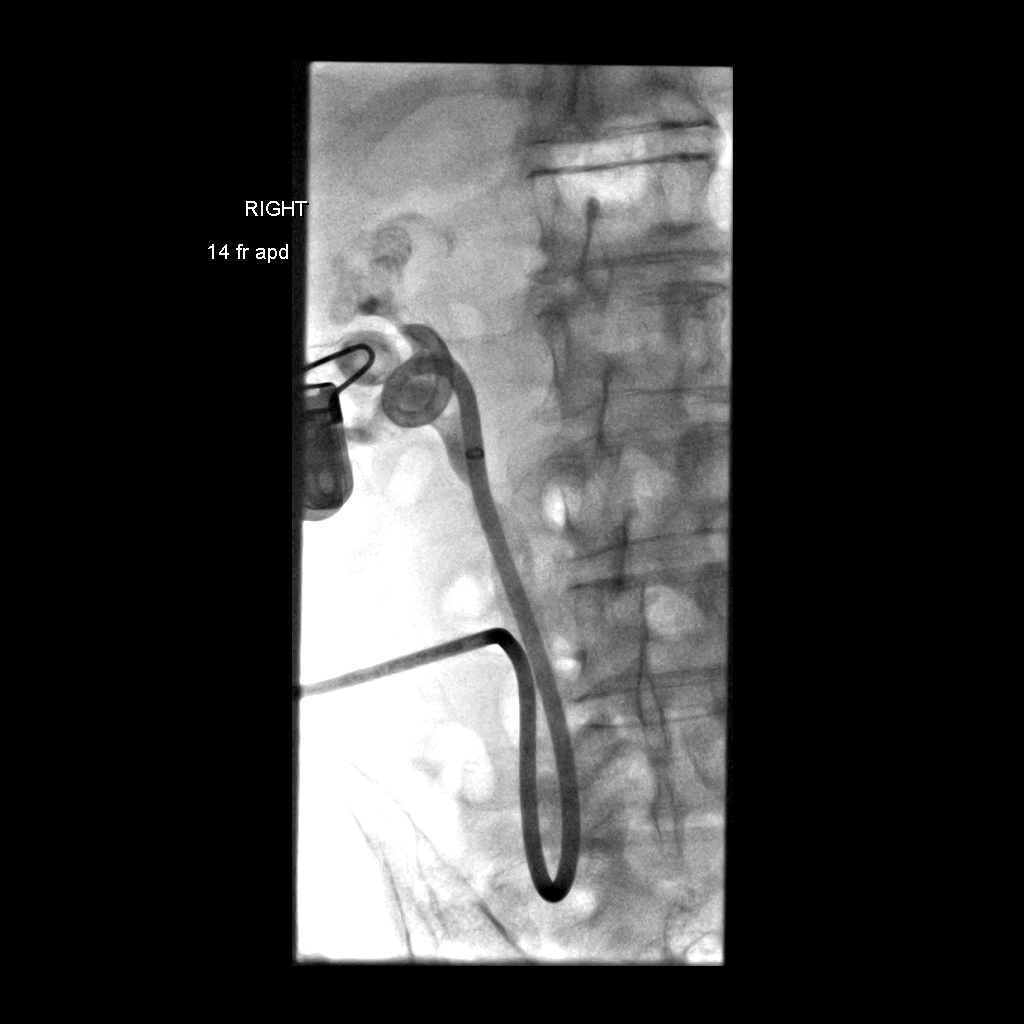

[11 of 11 positions shown; findings below may reference images not displayed]

A small amount of contrast was   injected through the
right nephrostomy catheter to opacify the renal collecting system.
The catheter
was removed over a stiff Amplatz
guidewire. A new 14 French pigtail catheter was advanced and
positioned centrally in the renal collecting system. The small bifid
collecting system would not allow ideal forming of the pigtail
catheter . Contrast injection confirms adequate positioning.
The ostomy device was placed. The patient tolerated the procedure
well.

COMPLICATIONS:
None.
IMPRESSION: 1. Technically successful exchange of 14 French retrograde
nephroureteral catheter under fluoroscopy.

We should order a long 12 or 14 Mashal Solanki nephroureteral
catheter, if available, which may be a more appropriate fit for the
patient's anatomy.

## 2018-07-15 DIAGNOSIS — N200 Calculus of kidney: Secondary | ICD-10-CM | POA: Diagnosis not present

## 2018-08-03 DIAGNOSIS — Z936 Other artificial openings of urinary tract status: Secondary | ICD-10-CM | POA: Diagnosis not present

## 2018-08-03 DIAGNOSIS — C679 Malignant neoplasm of bladder, unspecified: Secondary | ICD-10-CM | POA: Diagnosis not present

## 2018-08-12 IMAGING — XA IR BILIARY CATHETER EXCHANGE
4 series · 14 of 22 positions shown · non-contrast
Comparison: 09/18/2016

INDICATION: [AGE] male with a history of stricture at ureteral anastomosis
to conduit.

[Series 3: fl - angio · 3 of 89 frames shown (1 of 2)]
[frame 6/89]
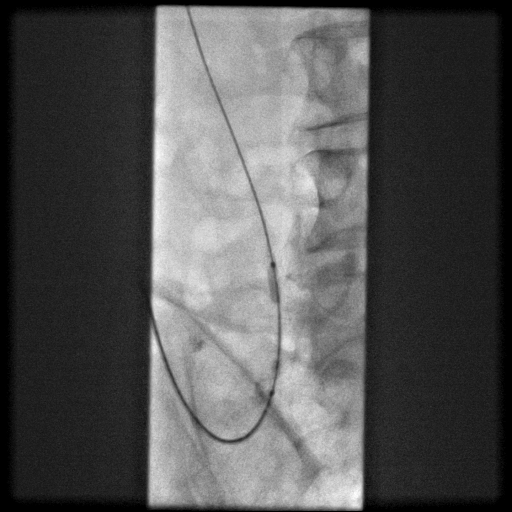
[frame 45/89]
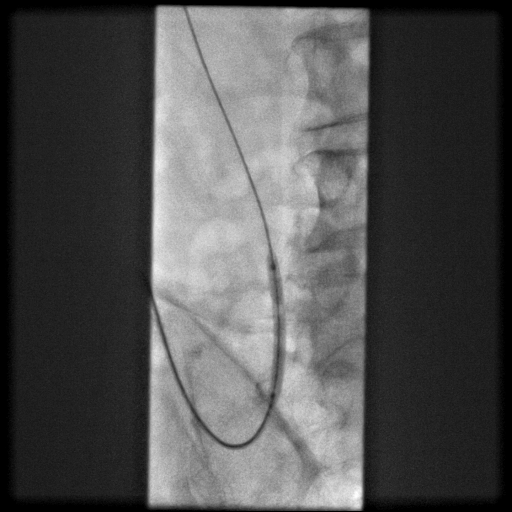
[frame 76/89]
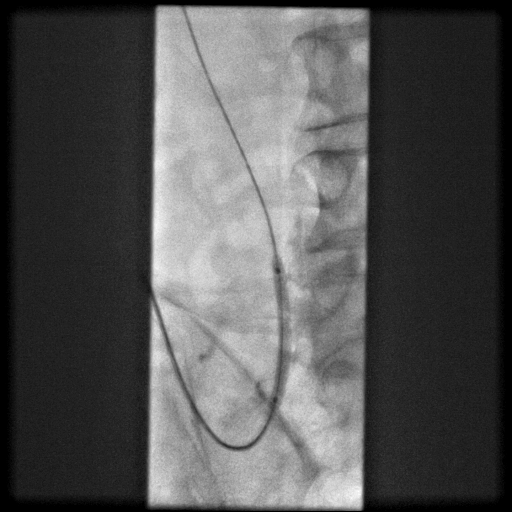

[Series 9: fl - angio · 2 of 81 frames shown (2 of 2)]
[frame 41/81]
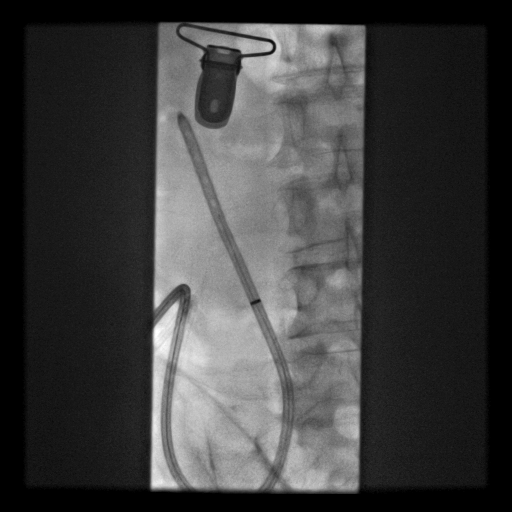
[frame 69/81]
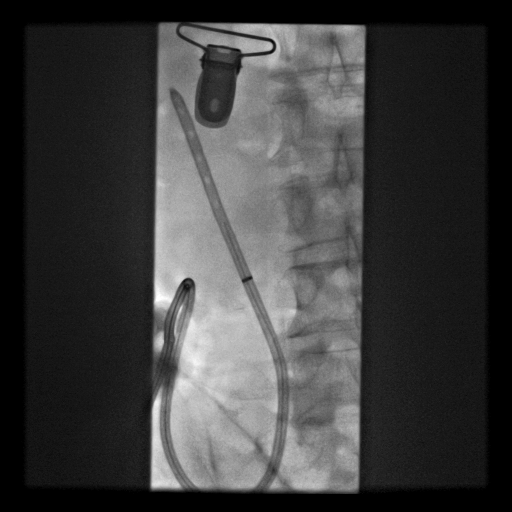

[Series 12: care single · 1 of 1 slices shown]
[im 1/1]
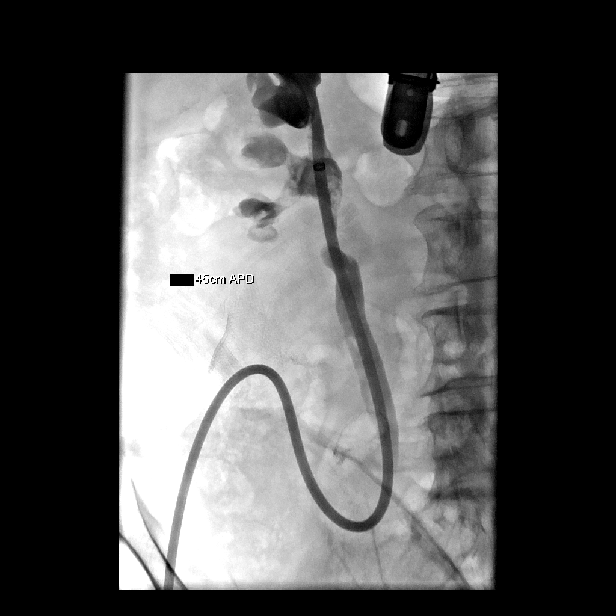

[Series 300: tube placements · 8 of 13 slices shown]
[im 2/13]
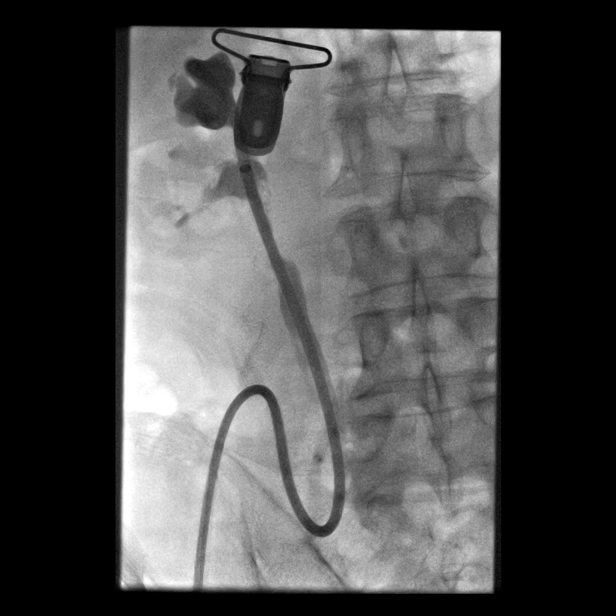
[im 3/13]
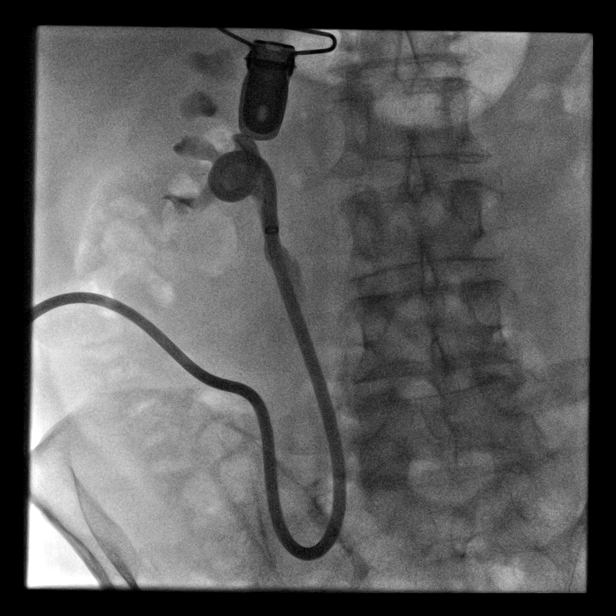
[im 5/13]
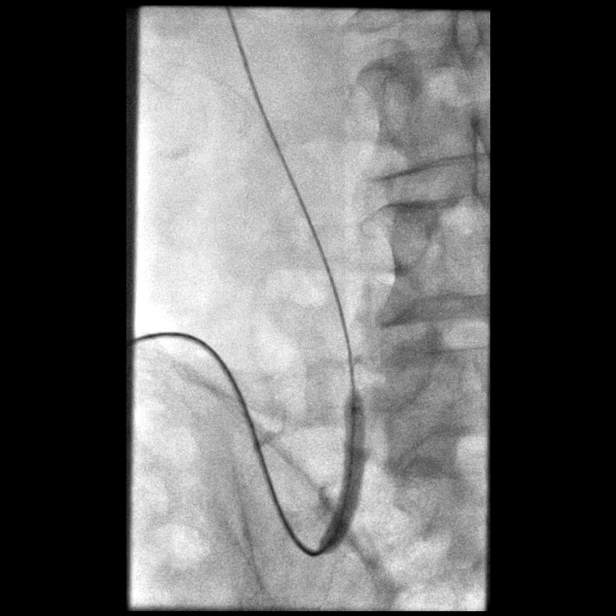
[im 6/13]
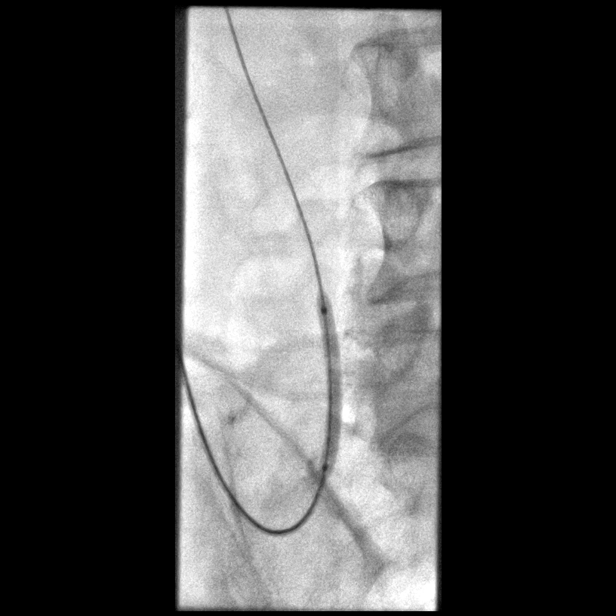
[im 8/13]
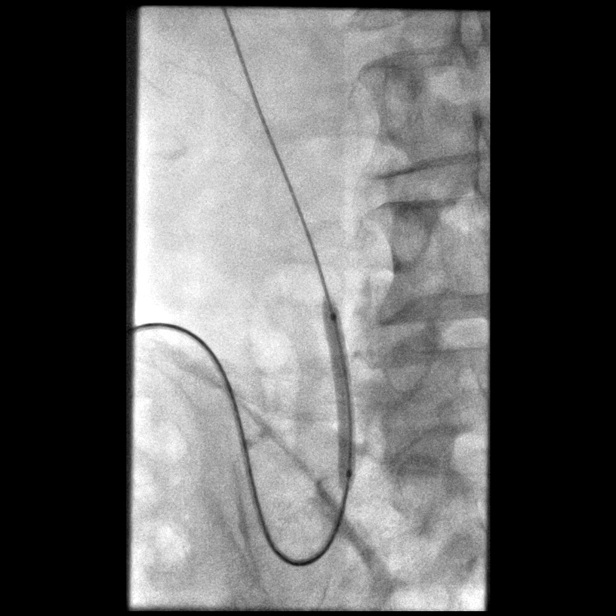
[im 10/13]
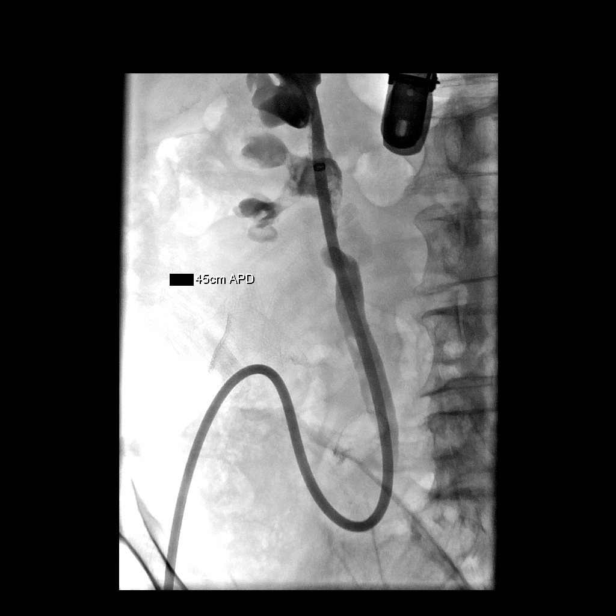
[im 11/13]
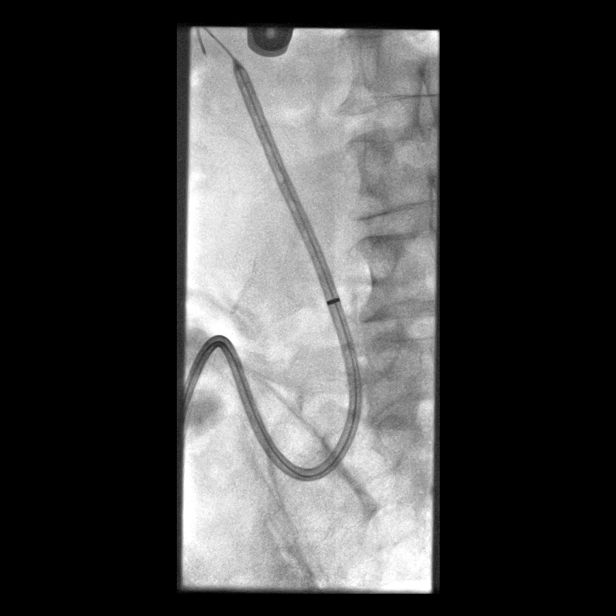
[im 13/13]
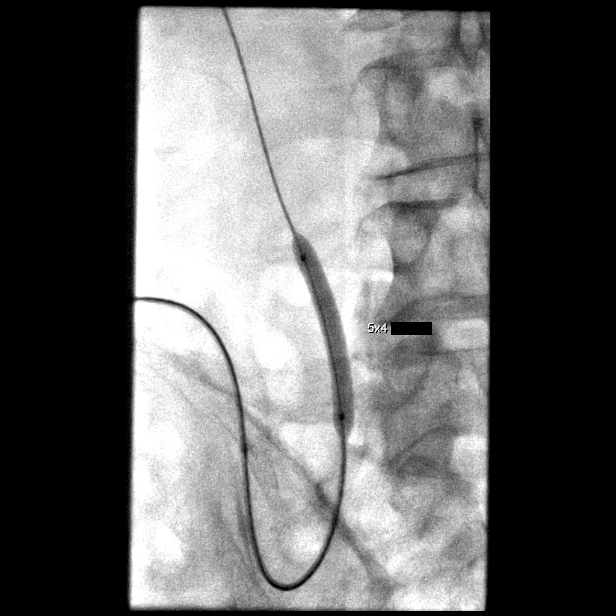

[14 of 22 positions shown; findings below may reference images not displayed]

His physiology is such that he develops excessive encrustation at
the tube, and is on a short interval routine exchange schedule.

Last exchange was for a 14 French catheter.

EXAM:
FLUOROSCOPIC EXCHANGE OF RIGHT-SIDED RETROGRADE NEPHROURETERAL DRAIN
VIA URINARY CONDUIT.

BALLOON ANGIOPLASTY OF BENIGN POSTSURGICAL STRICTURE AT URETERAL
ANASTOMOSIS
MEDICATIONS:
None.

ANESTHESIA/SEDATION:
None

CONTRAST:  10 cc - administered into the collecting system(s)

FLUOROSCOPY TIME:  Fluoroscopy Time: 16 minutes 30 seconds (192.9
mGy).

COMPLICATIONS:
None

PROCEDURE:
Informed written consent was obtained from the patient after a
thorough discussion of the procedural risks, benefits and
alternatives. All questions were addressed. Maximal Sterile Barrier
Technique was utilized including caps, mask, sterile gowns, sterile
gloves, sterile drape, hand hygiene and skin antiseptic. A timeout
was performed prior to the initiation of the procedure.

Patient positioned supine position on the fluoroscopy table. Scout
images of the abdomen performed.

Contrast was infused through the indwelling right-sided
nephroureteral tube, confirming location in the collecting system.

Stiff 035 glidewire was advanced through the catheter after ligated
in the hub of the catheter, into the right kidney collecting system.
Catheter was removed over the Glidewire.

Upon attempting to place the new 14 French pigtail drainage
catheter, significant resistance was encountered when attempting to
advance the drain from the plastic stiffener. Catheter was removed.
Kumpe catheter was advanced over the wire into the hilum of the
right kidney. Amplatz wire was attempted to advance of the Kumpe the
catheter, which slightly with through the Kumpe the catheter from
the collecting system. Amplatz wire was removed.

Glidewire was readvanced into the collecting system, Kumpe catheter
was removed. Another attempt at advancing the 14 French pigtail
catheter was unsuccessful.

Drain catheter was removed, angled catheter was advanced into the
collecting system, and the Glidewire was removed. 035 Coons wire was
advanced into the collecting system and the catheter was removed.
Balloon angioplasty was then performed with 4 mm and 5 mm diameter
balloon at the site of the ureteral anastomosis, presumably at the
site of the stricture.

Fourteen French catheter was again advanced to the collecting
system. Although the tip of the catheter was located in the
collecting system, the catheter would not advance from the plastic
stiffener secondary to significant resistance, presumably from the
stricture at the ureteral anastomosis.

The 14 French catheter was removed an a 12 French pigtail catheter
was advanced and formed into the upper pole collecting system.

Contrast injection confirmed location.

Patient tolerated the procedure well and remained hemodynamically
stable throughout.

No complications were encountered and no significant blood loss.
FINDINGS: Before balloon angioplasty of the site of stricture, the 14 French
catheter would not advance into the collecting system over the wire.

After balloon angioplasty of the stricture to 5 mm diameter, the 14
French catheter would advanced into the collecting system, however,
the catheter could not be advanced off of the plastic stiffener into
the collecting system.

After balloon angioplasty, a 12 French catheter was easily advanced
into the collecting system and formed in the upper pole collecting
system.
IMPRESSION: Status post retrograde exchange of right nephroureteral drain
through ostomy, with placement of a 12 French drainage catheter (45
cm) after balloon angioplasty of ureteral stricture to 5 mm.

Future exchange will be scheduled in approximately 4 weeks. It may
be reasonable to consider placing a biliary drain (40 cm) with
additional sideholes, either 12 French or 14 French.

## 2018-08-13 ENCOUNTER — Encounter (HOSPITAL_COMMUNITY): Payer: Self-pay | Admitting: Interventional Radiology

## 2018-08-13 ENCOUNTER — Other Ambulatory Visit: Payer: Self-pay | Admitting: Urology

## 2018-08-13 ENCOUNTER — Other Ambulatory Visit: Payer: Self-pay

## 2018-08-13 ENCOUNTER — Ambulatory Visit (HOSPITAL_COMMUNITY)
Admission: RE | Admit: 2018-08-13 | Discharge: 2018-08-13 | Disposition: A | Discharge status: Home / Self Care | Payer: Medicare HMO | Source: Ambulatory Visit | Attending: Urology | Admitting: Urology

## 2018-08-13 DIAGNOSIS — N135 Crossing vessel and stricture of ureter without hydronephrosis: Secondary | ICD-10-CM | POA: Insufficient documentation

## 2018-08-13 DIAGNOSIS — Z436 Encounter for attention to other artificial openings of urinary tract: Secondary | ICD-10-CM | POA: Diagnosis not present

## 2018-08-13 DIAGNOSIS — C679 Malignant neoplasm of bladder, unspecified: Secondary | ICD-10-CM | POA: Diagnosis not present

## 2018-08-13 HISTORY — PX: IR NEPHROSTOMY EXCHANGE RIGHT: IMG6070

## 2018-08-13 MED ORDER — IOHEXOL 300 MG/ML  SOLN
50.0000 mL | Freq: Once | INTRAMUSCULAR | Status: AC | PRN
  Administered 2018-08-13: 8 mL

## 2018-08-13 MED ORDER — LIDOCAINE HCL 1 % IJ SOLN
INTRAMUSCULAR | Status: AC
  Filled 2018-08-13: qty 20

## 2018-08-13 NOTE — Procedures (Signed)
  Procedure: Exchange R 57f D-W nephrostomy   EBL:   minimal Complications:  none immediate  See full dictation in [facility].  Dillard Cannon MD Main # [phone] Pager  [phone]

## 2018-08-19 DIAGNOSIS — Z936 Other artificial openings of urinary tract status: Secondary | ICD-10-CM | POA: Diagnosis not present

## 2018-08-19 DIAGNOSIS — R69 Illness, unspecified: Secondary | ICD-10-CM | POA: Diagnosis not present

## 2018-08-19 DIAGNOSIS — I8290 Acute embolism and thrombosis of unspecified vein: Secondary | ICD-10-CM | POA: Diagnosis not present

## 2018-08-19 DIAGNOSIS — Z950 Presence of cardiac pacemaker: Secondary | ICD-10-CM | POA: Diagnosis not present

## 2018-08-19 DIAGNOSIS — E875 Hyperkalemia: Secondary | ICD-10-CM | POA: Diagnosis not present

## 2018-08-19 DIAGNOSIS — C679 Malignant neoplasm of bladder, unspecified: Secondary | ICD-10-CM | POA: Diagnosis not present

## 2018-08-19 DIAGNOSIS — C61 Malignant neoplasm of prostate: Secondary | ICD-10-CM | POA: Diagnosis not present

## 2018-08-19 DIAGNOSIS — I350 Nonrheumatic aortic (valve) stenosis: Secondary | ICD-10-CM | POA: Diagnosis not present

## 2018-08-19 DIAGNOSIS — N184 Chronic kidney disease, stage 4 (severe): Secondary | ICD-10-CM | POA: Diagnosis not present

## 2018-08-19 DIAGNOSIS — M25561 Pain in right knee: Secondary | ICD-10-CM | POA: Diagnosis not present

## 2018-08-31 ENCOUNTER — Other Ambulatory Visit (HOSPITAL_COMMUNITY): Payer: Self-pay | Admitting: *Deleted

## 2018-08-31 ENCOUNTER — Other Ambulatory Visit: Payer: Self-pay

## 2018-09-01 ENCOUNTER — Ambulatory Visit (HOSPITAL_COMMUNITY)
Admission: RE | Admit: 2018-09-01 | Discharge: 2018-09-01 | Disposition: A | Discharge status: Home / Self Care | Payer: Medicare HMO | Source: Ambulatory Visit | Attending: Internal Medicine | Admitting: Internal Medicine

## 2018-09-01 DIAGNOSIS — M810 Age-related osteoporosis without current pathological fracture: Secondary | ICD-10-CM | POA: Diagnosis not present

## 2018-09-01 MED ORDER — DENOSUMAB 60 MG/ML ~~LOC~~ SOSY
60.0000 mg | PREFILLED_SYRINGE | Freq: Once | SUBCUTANEOUS | Status: AC
  Administered 2018-09-01: 60 mg via SUBCUTANEOUS

## 2018-09-01 MED ORDER — DENOSUMAB 60 MG/ML ~~LOC~~ SOSY
PREFILLED_SYRINGE | SUBCUTANEOUS | Status: AC
  Filled 2018-09-01: qty 1

## 2018-09-06 IMAGING — XA IR BILIARY CATHETER EXCHANGE
1 series · 3 of 3 positions shown · non-contrast
Comparison: Multiple previous fluoroscopic guided ureteral catheter
exchanges, most recently on 10/24/2016.

INDICATION: History of ileal conduit creation with chronic right-sided
retrograde nephroureteral catheter for right ureteral stricture.
TECHNIQUE: Informed written consent was obtained from the patient after a
discussion of the risks, benefits and alternatives to treatment.
Questions regarding the procedure were encouraged and answered. A
timeout was performed prior to the initiation of the procedure.

[Series 300: tube placements · 3 of 3 slices shown]
[im 1/3]
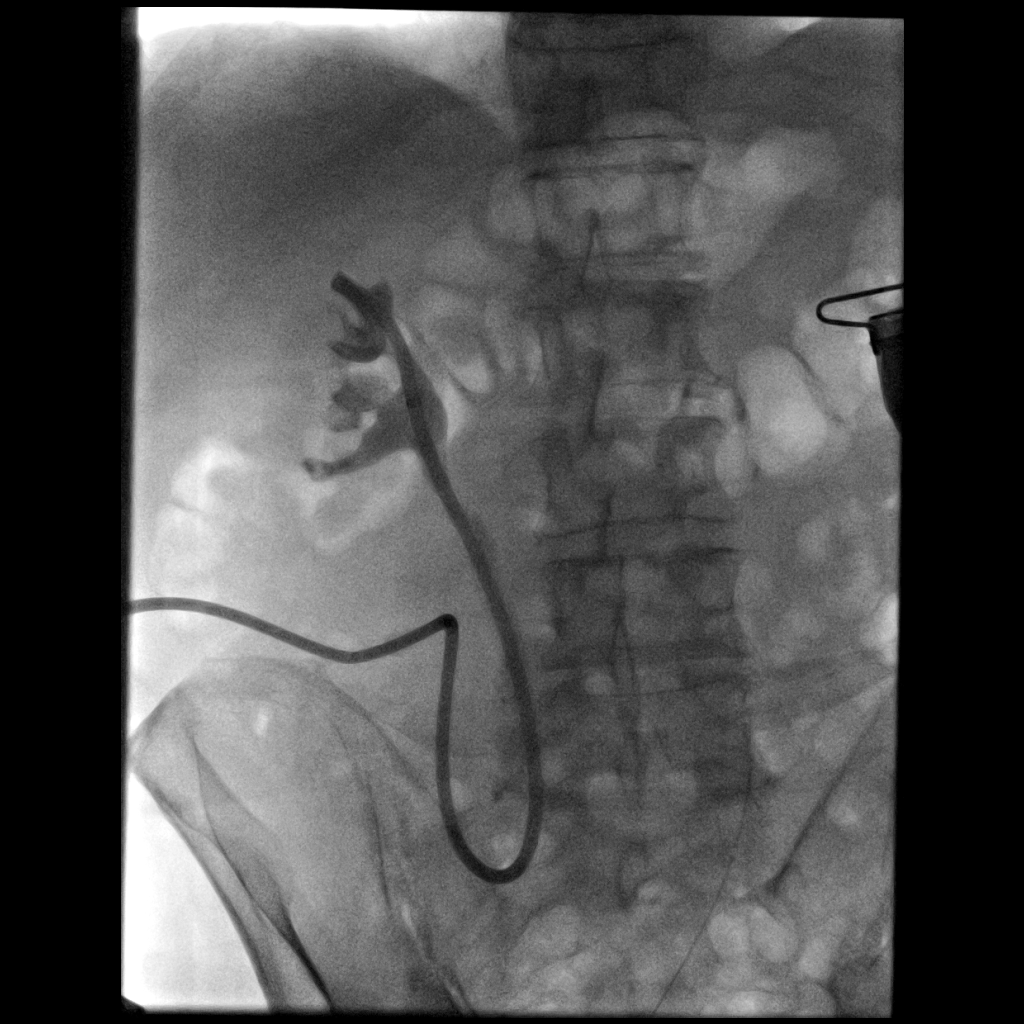
[im 2/3]
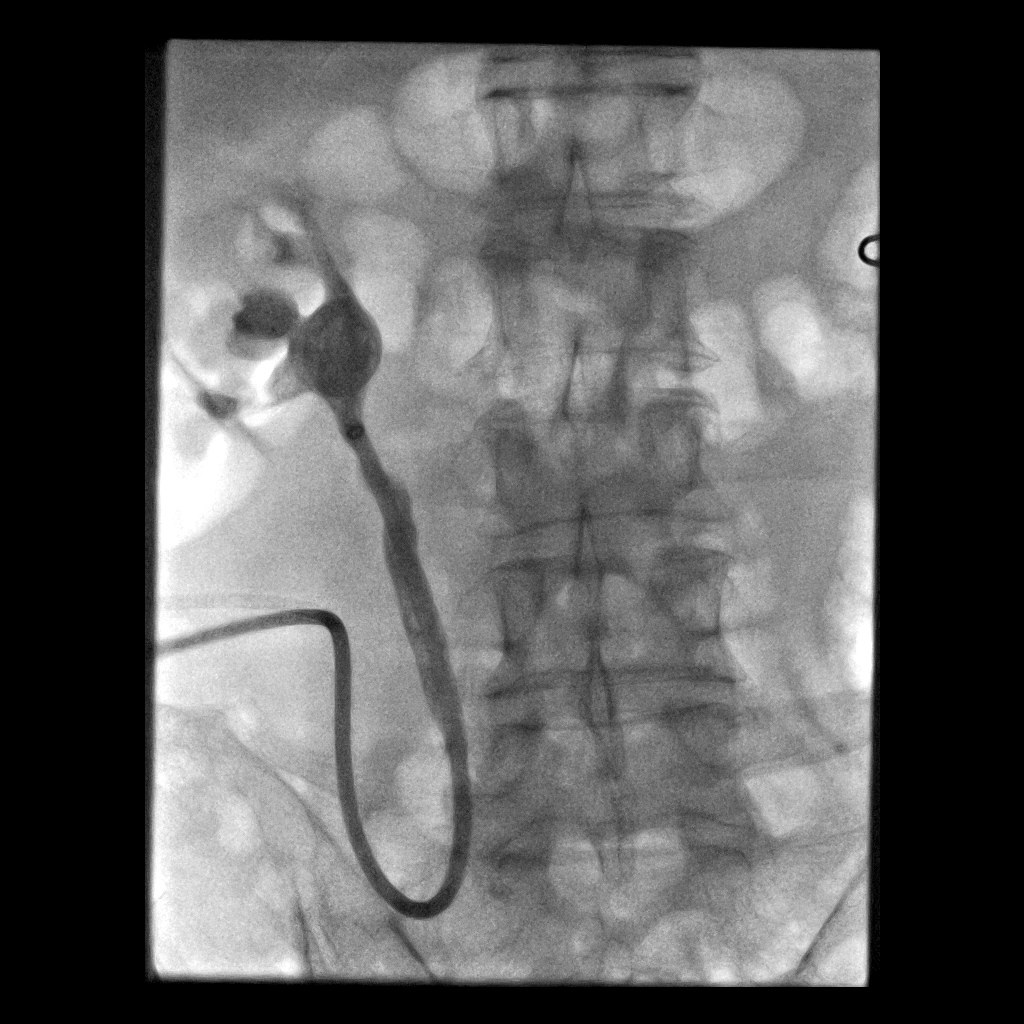
[im 3/3]
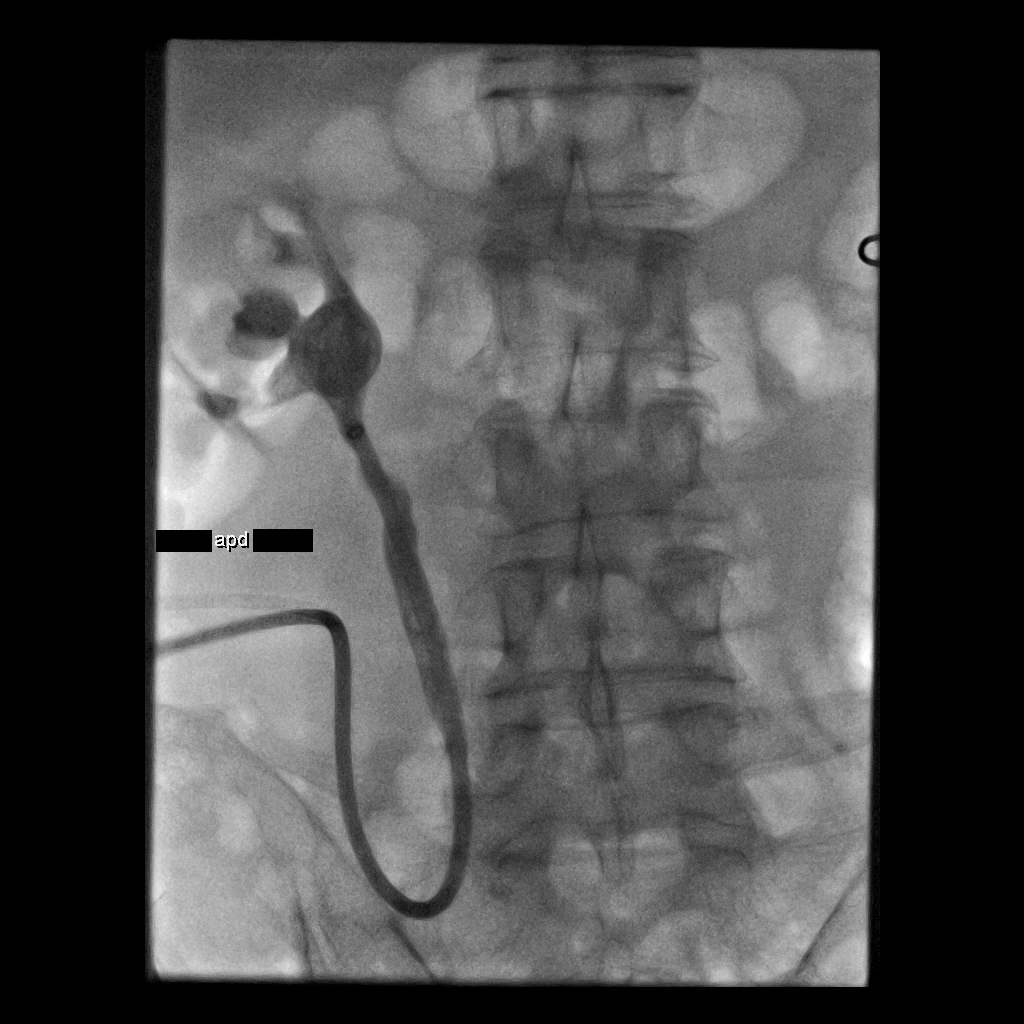

[3 of 3 positions shown; findings below may reference images not displayed]

Note, patient's most recent fluoroscopic guided exchange performed
10/24/2016 was complicated by a tight structure involving the distal
aspect of the right ureter requiring balloon angioplasty to allow
for ureteral stent exchange.

As such, patient presents approximately 1 month following recent
exchange.

EXAM:
FLUOROSCOPIC GUIDED RIGHT SIDED RETROGRADE NEPHROURETERAL CATHETER
EXCHANGE
CONTRAST:  10 mL Ysovue-UBB administered into the collecting system

FLUOROSCOPY TIME:  2 minutes 54 seconds (31 mGy)

COMPLICATIONS:
None immediate.
The external portion of the right-sided retrograde nephroureteral
catheter as well the surrounding skin was prepped and draped in the
usual sterile fashion. A sterile drape was applied covering the
operative field. Maximum barrier sterile technique with sterile
gowns and gloves were used for the procedure. A timeout was
performed prior to the initiation of the procedure.

A pre procedural spot fluoroscopic image was obtained after contrast
was injected via the existing nephrostomy catheter demonstrating
appropriate positioning within the renal pelvis. The existing
nephrostomy catheter was cut and cannulated with an Amplatz wire
which was coiled within the renal pelvis. Under intermittent
fluoroscopic guidance, the existing nephrostomy catheter was
exchanged for a new 12 French, 45 cm all-purpose drainage catheter.

Note, difficulty was again encountered while attempting to remove
the inner stiffener from the drainage catheter and as such, the
drainage catheter was advanced without the use of an inner
stiffener.

Contrast injection confirmed appropriate positioning within the
renal pelvis and a post exchange fluoroscopic image was obtained.
The catheter was locked and a urostomy bag was applied.

The patient tolerated the procedure well without immediate
postprocedural complication.
FINDINGS: The existing right-sided nephroureteral catheter is appropriately
positioned and functioning.

After successful fluoroscopic guided exchange, the new nephrostomy
catheter is coiled and locked within the right renal pelvis.
IMPRESSION: Successful fluoroscopic guided exchange of right sided 12 French, 45
cm retrograde nephroureteral catheter.

PLAN:
Based on the patient's daughter's request, will again attempt to
extend the time interval for routine catheter exchange to
approximately 6 weeks.

Note, I experienced improved success exchanging the retrograde
nephroureteral catheter without the use of an inner stiffener and
this technique could be considered during the patient's next routine
fluoroscopic guided exchange as deemed appropriate.

## 2018-10-01 ENCOUNTER — Ambulatory Visit (HOSPITAL_COMMUNITY)
Admission: RE | Admit: 2018-10-01 | Discharge: 2018-10-01 | Disposition: A | Discharge status: Home / Self Care | Payer: Medicare HMO | Source: Ambulatory Visit | Attending: Urology | Admitting: Urology

## 2018-10-01 ENCOUNTER — Encounter (HOSPITAL_COMMUNITY): Payer: Self-pay | Admitting: Interventional Radiology

## 2018-10-01 ENCOUNTER — Other Ambulatory Visit: Payer: Self-pay

## 2018-10-01 DIAGNOSIS — N135 Crossing vessel and stricture of ureter without hydronephrosis: Secondary | ICD-10-CM | POA: Insufficient documentation

## 2018-10-01 DIAGNOSIS — Z436 Encounter for attention to other artificial openings of urinary tract: Secondary | ICD-10-CM | POA: Diagnosis present

## 2018-10-01 HISTORY — PX: IR NEPHROSTOMY EXCHANGE RIGHT: IMG6070

## 2018-10-01 MED ORDER — IOHEXOL 300 MG/ML  SOLN
50.0000 mL | Freq: Once | INTRAMUSCULAR | Status: AC | PRN
  Administered 2018-10-01: 10 mL

## 2018-10-01 MED ORDER — LIDOCAINE HCL 1 % IJ SOLN
INTRAMUSCULAR | Status: AC
  Filled 2018-10-01: qty 20

## 2018-10-01 NOTE — Procedures (Signed)
Interventional Radiology Procedure Note  Procedure: Routine right PCN exchange.  New 56F drain.  Complications: None Recommendations:  - Ok to shower tomorrow - Do not submerge - Routine care   Signed,  Dulcy Fanny. Earleen Newport, DO

## 2018-10-05 ENCOUNTER — Other Ambulatory Visit (HOSPITAL_COMMUNITY): Payer: Self-pay | Admitting: Interventional Radiology

## 2018-10-05 DIAGNOSIS — C679 Malignant neoplasm of bladder, unspecified: Secondary | ICD-10-CM | POA: Diagnosis not present

## 2018-10-05 DIAGNOSIS — N135 Crossing vessel and stricture of ureter without hydronephrosis: Secondary | ICD-10-CM

## 2018-10-05 DIAGNOSIS — Z936 Other artificial openings of urinary tract status: Secondary | ICD-10-CM | POA: Diagnosis not present

## 2018-10-07 ENCOUNTER — Encounter (INDEPENDENT_AMBULATORY_CARE_PROVIDER_SITE_OTHER): Payer: Medicare HMO | Admitting: Ophthalmology

## 2018-10-07 ENCOUNTER — Other Ambulatory Visit: Payer: Self-pay

## 2018-10-07 DIAGNOSIS — H43813 Vitreous degeneration, bilateral: Secondary | ICD-10-CM | POA: Diagnosis not present

## 2018-10-07 DIAGNOSIS — H353221 Exudative age-related macular degeneration, left eye, with active choroidal neovascularization: Secondary | ICD-10-CM

## 2018-10-07 DIAGNOSIS — H353114 Nonexudative age-related macular degeneration, right eye, advanced atrophic with subfoveal involvement: Secondary | ICD-10-CM | POA: Diagnosis not present

## 2018-10-07 DIAGNOSIS — H35033 Hypertensive retinopathy, bilateral: Secondary | ICD-10-CM

## 2018-10-07 DIAGNOSIS — I10 Essential (primary) hypertension: Secondary | ICD-10-CM

## 2018-10-19 ENCOUNTER — Other Ambulatory Visit (HOSPITAL_COMMUNITY): Payer: Medicare HMO

## 2018-10-19 ENCOUNTER — Telehealth: Payer: Self-pay

## 2018-10-19 NOTE — Telephone Encounter (Signed)
Informed daughter that we will order a home monitor. The pt has previously been "in-clinic" checks. Will schedule for home remote pacer checks.

## 2018-10-19 NOTE — Telephone Encounter (Signed)
Spoke to pt, he requests that we speak with his daughter. LMOVM for daughter to call DC back.

## 2018-10-21 NOTE — Telephone Encounter (Signed)
Spoke with pt's daughter, Golda Acre. She is aware that monitor has been ordered and agrees to call the direct DC phone number when monitor is received for assistance with setup. She is also aware that DC appointment on 11/12/18 has been canceled as it will not be necessary.

## 2018-10-21 NOTE — Telephone Encounter (Signed)
[employer] monitor ordered 10/21/18, should arrive in 7-10 business days.

## 2018-10-28 NOTE — Telephone Encounter (Signed)
Monitor shipped 10/22/18 per [employer].

## 2018-10-29 ENCOUNTER — Ambulatory Visit (INDEPENDENT_AMBULATORY_CARE_PROVIDER_SITE_OTHER): Payer: Medicare HMO | Admitting: *Deleted

## 2018-10-29 DIAGNOSIS — R001 Bradycardia, unspecified: Secondary | ICD-10-CM

## 2018-10-29 DIAGNOSIS — Z950 Presence of cardiac pacemaker: Secondary | ICD-10-CM

## 2018-10-29 LAB — CUP PACEART REMOTE DEVICE CHECK
Battery Impedance: 2538 Ohm
Battery Remaining Longevity: 21 mo
Battery Voltage: 2.73 V
Brady Statistic AP VP Percent: 88 %
Brady Statistic AP VS Percent: 11 %
Brady Statistic AS VP Percent: 1 %
Brady Statistic AS VS Percent: 0 %
Date Time Interrogation Session: 20200805201309
Implantable Lead Implant Date: 20090917
Implantable Lead Implant Date: 20090917
Implantable Lead Location: 753859
Implantable Lead Location: 753860
Implantable Lead Model: 5076
Implantable Lead Model: 5076
Implantable Pulse Generator Implant Date: 20090917
Lead Channel Impedance Value: 418 Ohm
Lead Channel Impedance Value: 436 Ohm
Lead Channel Pacing Threshold Amplitude: 0.5 V
Lead Channel Pacing Threshold Amplitude: 0.5 V
Lead Channel Pacing Threshold Pulse Width: 0.4 ms
Lead Channel Pacing Threshold Pulse Width: 0.4 ms
Lead Channel Setting Pacing Amplitude: 2 V
Lead Channel Setting Pacing Amplitude: 2.5 V
Lead Channel Setting Pacing Pulse Width: 0.4 ms
Lead Channel Setting Sensing Sensitivity: 2 mV

## 2018-10-29 NOTE — Telephone Encounter (Signed)
Transmission received 10/28/2018

## 2018-11-03 ENCOUNTER — Encounter: Payer: Self-pay | Admitting: Cardiology

## 2018-11-03 NOTE — Progress Notes (Signed)
Remote pacemaker transmission.   

## 2018-11-05 NOTE — Telephone Encounter (Signed)
° °  Left message for Lamarkus Nebel to call office to arrange appointment for Mr Ging.

## 2018-11-19 ENCOUNTER — Other Ambulatory Visit (HOSPITAL_COMMUNITY): Payer: Self-pay | Admitting: Interventional Radiology

## 2018-11-19 ENCOUNTER — Encounter (HOSPITAL_COMMUNITY): Payer: Self-pay | Admitting: Interventional Radiology

## 2018-11-19 ENCOUNTER — Ambulatory Visit (HOSPITAL_COMMUNITY)
Admission: RE | Admit: 2018-11-19 | Discharge: 2018-11-19 | Disposition: A | Discharge status: Home / Self Care | Payer: Medicare HMO | Source: Ambulatory Visit | Attending: Interventional Radiology | Admitting: Interventional Radiology

## 2018-11-19 ENCOUNTER — Other Ambulatory Visit: Payer: Self-pay

## 2018-11-19 DIAGNOSIS — N135 Crossing vessel and stricture of ureter without hydronephrosis: Secondary | ICD-10-CM | POA: Diagnosis not present

## 2018-11-19 DIAGNOSIS — Z436 Encounter for attention to other artificial openings of urinary tract: Secondary | ICD-10-CM | POA: Diagnosis not present

## 2018-11-19 HISTORY — PX: IR NEPHROSTOMY EXCHANGE RIGHT: IMG6070

## 2018-11-19 MED ORDER — LIDOCAINE HCL 1 % IJ SOLN
INTRAMUSCULAR | Status: AC
  Filled 2018-11-19: qty 20

## 2018-11-19 MED ORDER — IOHEXOL 300 MG/ML  SOLN
50.0000 mL | Freq: Once | INTRAMUSCULAR | Status: AC | PRN
  Administered 2018-11-19: 10 mL

## 2018-11-20 ENCOUNTER — Other Ambulatory Visit (HOSPITAL_COMMUNITY): Payer: Medicare HMO

## 2018-11-22 IMAGING — XA IR BILIARY CATHETER EXCHANGE
1 series · 3 of 3 positions shown · non-contrast
Comparison: none

INDICATION: [AGE] with ureteral stricture and routine exchange of the
right nephroureteral catheter via the ostomy.

[Series 300: tube placements · 3 of 3 slices shown]
[im 1/3]
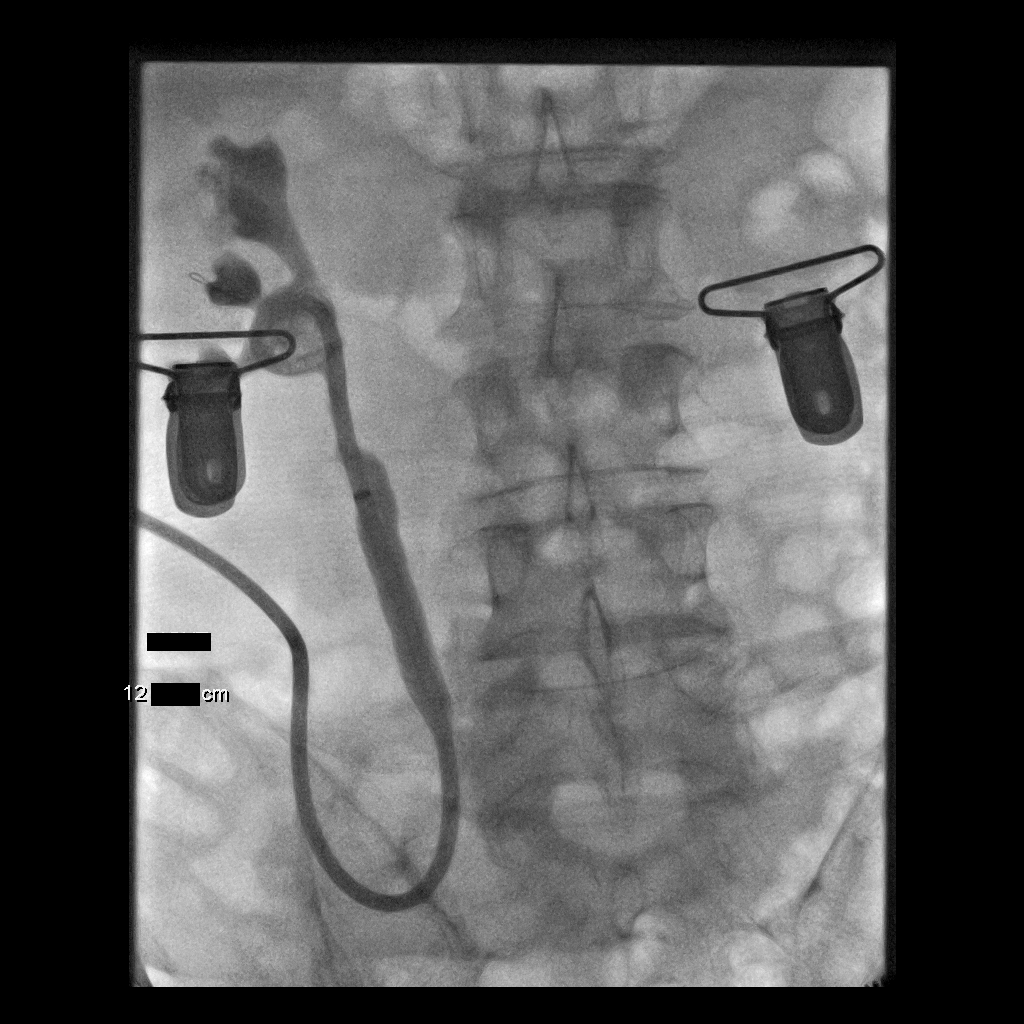
[im 2/3]
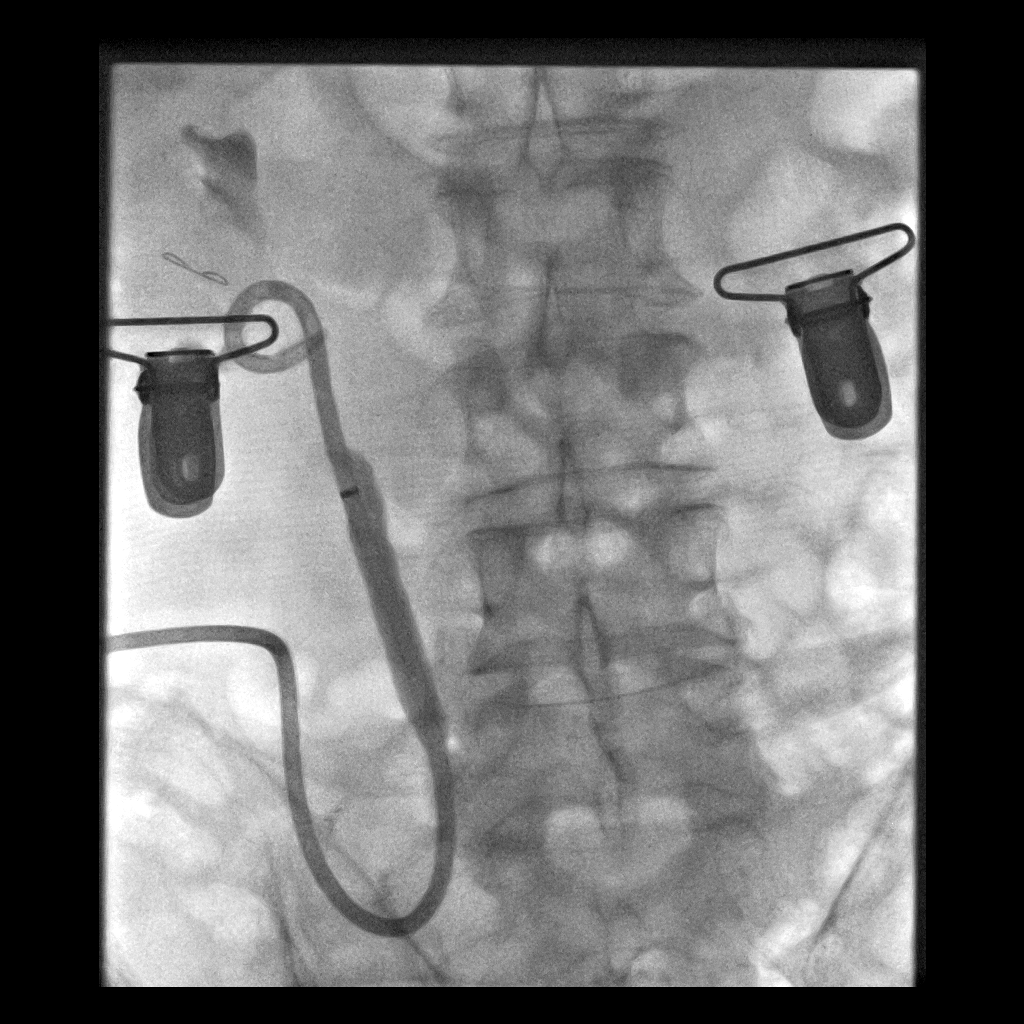
[im 3/3]
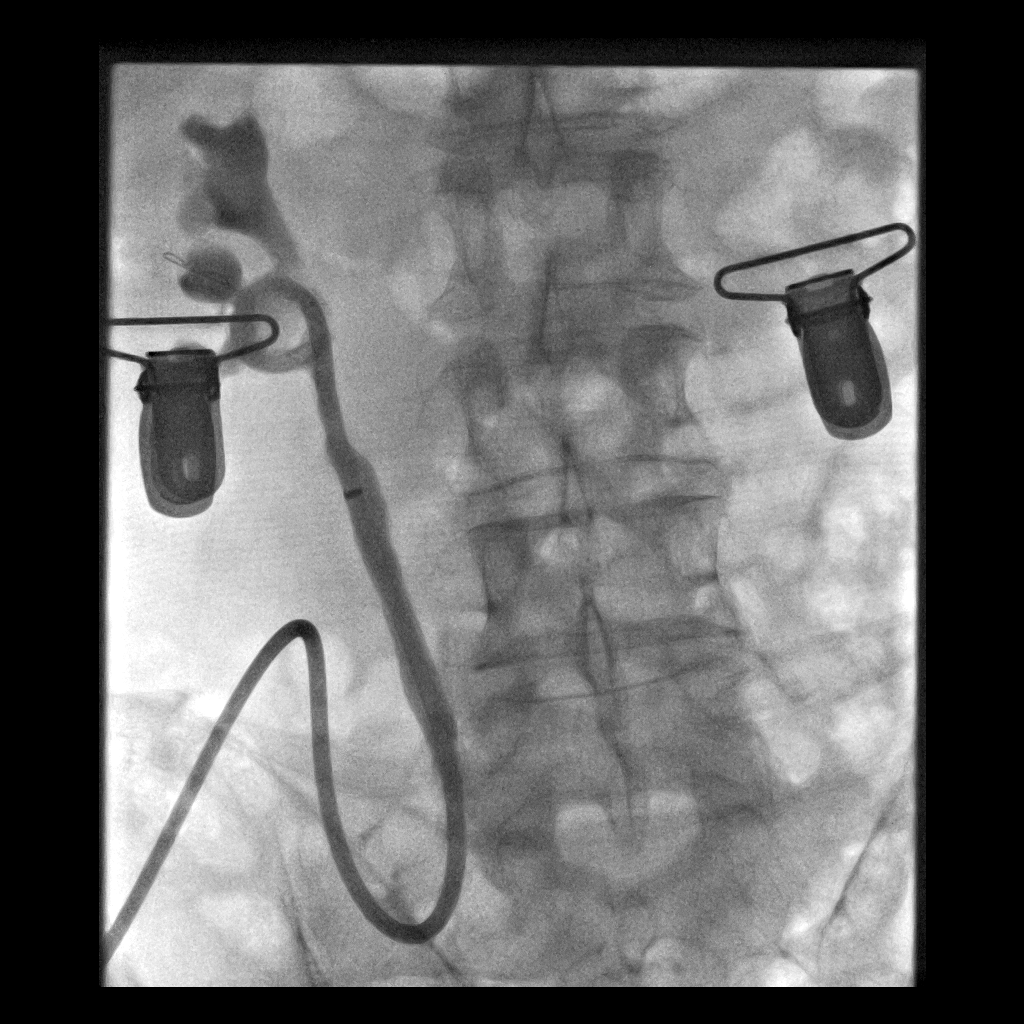

[3 of 3 positions shown; findings below may reference images not displayed]

EXAM:
EXCHANGE OF RIGHT NEPHROURETERAL CATHETER WITH FLUOROSCOPY

MEDICATIONS:
None

ANESTHESIA/SEDATION:
None

CONTRAST:  10 mL 1sovue-3MM - administered into the collecting
system(s)

FLUOROSCOPY TIME:  Fluoroscopy Time: 2 minutes 54 seconds (25 mGy).

COMPLICATIONS:
None immediate.

PROCEDURE:
The procedure was explained to the patient. The risks and benefits
of the procedure were discussed and the patient's questions were
addressed. Informed consent was obtained from the patient. The right
lower abdomen and existing tube were prepped and draped in sterile
fashion. Maximal barrier sterile technique was utilized including
caps, mask, sterile gowns, sterile gloves, sterile drape, hand
hygiene and skin antiseptic. Contrast injection confirmed that the
catheter was well positioned in the renal pelvis. The catheter was
cut and removed over a stiff Amplatz wire. Catheter was removed over
the wire. A new 12 French, 45 cm tube was advanced over the wire and
reconstituted in the renal pelvis.

Fluoroscopic images were taken and saved for this procedure.
FINDINGS: Right nephroureteral catheter extending through the ostomy. New
catheter is well positioned in the renal pelvis.
IMPRESSION: Successful exchange of the right nephroureteral catheter from a
retrograde approach via the ostomy.

## 2018-12-04 ENCOUNTER — Telehealth: Payer: Self-pay | Admitting: *Deleted

## 2018-12-04 ENCOUNTER — Telehealth: Payer: Self-pay | Admitting: Internal Medicine

## 2018-12-04 DIAGNOSIS — I130 Hypertensive heart and chronic kidney disease with heart failure and stage 1 through stage 4 chronic kidney disease, or unspecified chronic kidney disease: Secondary | ICD-10-CM | POA: Diagnosis not present

## 2018-12-04 DIAGNOSIS — I509 Heart failure, unspecified: Secondary | ICD-10-CM | POA: Diagnosis not present

## 2018-12-04 DIAGNOSIS — N183 Chronic kidney disease, stage 3 (moderate): Secondary | ICD-10-CM | POA: Diagnosis not present

## 2018-12-04 DIAGNOSIS — R0600 Dyspnea, unspecified: Secondary | ICD-10-CM | POA: Diagnosis not present

## 2018-12-04 NOTE — Telephone Encounter (Signed)
Pt c/o Shortness Of Breath: STAT if SOB developed within the last 24 hours or pt is noticeably SOB on the phone  1. Are you currently SOB (can you hear that pt is SOB on the phone)? Daughter is calling- Short of breath last 2 night- just can not breathe- His oxygen level now is 95  2. How long have you been experiencing SOB?  The last 2 nights  3. Are you SOB when sitting or when up moving around- both she thinks 4. Are you currently experiencing any other symptoms?no, she wants pt to be seen today please

## 2018-12-04 NOTE — Telephone Encounter (Signed)
Patient and his son walked into the clinic around 1:55 pm. Patient stated that "He is having trouble breathing when lying down for 2-3 days. It doesn't bother him as much when he is up". I asked him if he is having any chest pain, he denies chest pain. "He stated that he has a " little thick white congestion in his stomach". I checked his temp 97.3 degrees. Patient's son stated that he has an appointment with PCP's office (Dr. Joylene Draft), he will not see Dr. Joylene Draft, but someone in his office at 3:00 pm today. I spoke with Johnney Ou our office manager to advise her regarding patient's symptoms. He was advise that Dr. Lovena Le is not in the office today. He and his son was advised to keep appointment with PCP's office and if the office feels symptoms are cardiac related to call our office. Patient and son verbalized understanding.

## 2018-12-04 NOTE — Telephone Encounter (Signed)
Per call from pt's daughter following up in pt being see today?  Please give her a call back.

## 2018-12-07 DIAGNOSIS — I509 Heart failure, unspecified: Secondary | ICD-10-CM | POA: Diagnosis not present

## 2018-12-07 DIAGNOSIS — N183 Chronic kidney disease, stage 3 (moderate): Secondary | ICD-10-CM | POA: Diagnosis not present

## 2018-12-07 DIAGNOSIS — E875 Hyperkalemia: Secondary | ICD-10-CM | POA: Diagnosis not present

## 2018-12-07 DIAGNOSIS — I130 Hypertensive heart and chronic kidney disease with heart failure and stage 1 through stage 4 chronic kidney disease, or unspecified chronic kidney disease: Secondary | ICD-10-CM | POA: Diagnosis not present

## 2018-12-09 ENCOUNTER — Observation Stay (HOSPITAL_COMMUNITY)
Admission: EM | Admit: 2018-12-09 | Discharge: 2018-12-12 | Disposition: A | Discharge status: Home / Self Care | Payer: Medicare HMO | Attending: Family Medicine | Admitting: Family Medicine

## 2018-12-09 ENCOUNTER — Other Ambulatory Visit: Payer: Self-pay

## 2018-12-09 ENCOUNTER — Emergency Department (HOSPITAL_COMMUNITY): Payer: Medicare HMO

## 2018-12-09 DIAGNOSIS — Z7982 Long term (current) use of aspirin: Secondary | ICD-10-CM | POA: Insufficient documentation

## 2018-12-09 DIAGNOSIS — N184 Chronic kidney disease, stage 4 (severe): Secondary | ICD-10-CM | POA: Diagnosis present

## 2018-12-09 DIAGNOSIS — Z79899 Other long term (current) drug therapy: Secondary | ICD-10-CM | POA: Insufficient documentation

## 2018-12-09 DIAGNOSIS — Z950 Presence of cardiac pacemaker: Secondary | ICD-10-CM | POA: Diagnosis present

## 2018-12-09 DIAGNOSIS — I130 Hypertensive heart and chronic kidney disease with heart failure and stage 1 through stage 4 chronic kidney disease, or unspecified chronic kidney disease: Principal | ICD-10-CM | POA: Insufficient documentation

## 2018-12-09 DIAGNOSIS — I313 Pericardial effusion (noninflammatory): Secondary | ICD-10-CM | POA: Insufficient documentation

## 2018-12-09 DIAGNOSIS — I110 Hypertensive heart disease with heart failure: Secondary | ICD-10-CM | POA: Diagnosis not present

## 2018-12-09 DIAGNOSIS — I5033 Acute on chronic diastolic (congestive) heart failure: Secondary | ICD-10-CM | POA: Diagnosis present

## 2018-12-09 DIAGNOSIS — N189 Chronic kidney disease, unspecified: Secondary | ICD-10-CM | POA: Diagnosis present

## 2018-12-09 DIAGNOSIS — I083 Combined rheumatic disorders of mitral, aortic and tricuspid valves: Secondary | ICD-10-CM | POA: Insufficient documentation

## 2018-12-09 DIAGNOSIS — Z86718 Personal history of other venous thrombosis and embolism: Secondary | ICD-10-CM | POA: Diagnosis not present

## 2018-12-09 DIAGNOSIS — J90 Pleural effusion, not elsewhere classified: Secondary | ICD-10-CM | POA: Insufficient documentation

## 2018-12-09 DIAGNOSIS — Z20828 Contact with and (suspected) exposure to other viral communicable diseases: Secondary | ICD-10-CM | POA: Diagnosis not present

## 2018-12-09 DIAGNOSIS — R2681 Unsteadiness on feet: Secondary | ICD-10-CM | POA: Insufficient documentation

## 2018-12-09 DIAGNOSIS — M109 Gout, unspecified: Secondary | ICD-10-CM | POA: Diagnosis not present

## 2018-12-09 DIAGNOSIS — H548 Legal blindness, as defined in USA: Secondary | ICD-10-CM | POA: Diagnosis not present

## 2018-12-09 DIAGNOSIS — I509 Heart failure, unspecified: Secondary | ICD-10-CM

## 2018-12-09 DIAGNOSIS — I5043 Acute on chronic combined systolic (congestive) and diastolic (congestive) heart failure: Secondary | ICD-10-CM | POA: Diagnosis not present

## 2018-12-09 DIAGNOSIS — R0602 Shortness of breath: Secondary | ICD-10-CM | POA: Diagnosis not present

## 2018-12-09 DIAGNOSIS — I350 Nonrheumatic aortic (valve) stenosis: Secondary | ICD-10-CM

## 2018-12-09 DIAGNOSIS — N183 Chronic kidney disease, stage 3 (moderate): Secondary | ICD-10-CM | POA: Insufficient documentation

## 2018-12-09 DIAGNOSIS — I10 Essential (primary) hypertension: Secondary | ICD-10-CM | POA: Diagnosis not present

## 2018-12-09 DIAGNOSIS — Z66 Do not resuscitate: Secondary | ICD-10-CM | POA: Insufficient documentation

## 2018-12-09 DIAGNOSIS — Z87891 Personal history of nicotine dependence: Secondary | ICD-10-CM | POA: Diagnosis not present

## 2018-12-09 DIAGNOSIS — N1330 Unspecified hydronephrosis: Secondary | ICD-10-CM | POA: Diagnosis present

## 2018-12-09 LAB — URINALYSIS, ROUTINE W REFLEX MICROSCOPIC
Bilirubin Urine: NEGATIVE
Glucose, UA: NEGATIVE mg/dL
Hgb urine dipstick: NEGATIVE
Ketones, ur: NEGATIVE mg/dL
Nitrite: NEGATIVE
Protein, ur: 30 mg/dL — AB
Specific Gravity, Urine: 1.013 (ref 1.005–1.030)
pH: 7 (ref 5.0–8.0)

## 2018-12-09 LAB — BASIC METABOLIC PANEL
Anion gap: 12 (ref 5–15)
BUN: 47 mg/dL — ABNORMAL HIGH (ref 8–23)
CO2: 27 mmol/L (ref 22–32)
Calcium: 9.1 mg/dL (ref 8.9–10.3)
Chloride: 103 mmol/L (ref 98–111)
Creatinine, Ser: 2.07 mg/dL — ABNORMAL HIGH (ref 0.61–1.24)
GFR calc Af Amer: 31 mL/min — ABNORMAL LOW (ref >60)
GFR calc non Af Amer: 26 mL/min — ABNORMAL LOW (ref >60)
Glucose, Bld: 117 mg/dL — ABNORMAL HIGH (ref 70–99)
Potassium: 4.3 mmol/L (ref 3.5–5.1)
Sodium: 142 mmol/L (ref 135–145)

## 2018-12-09 LAB — CBC
HCT: 39.7 % (ref 39.0–52.0)
Hemoglobin: 12.6 g/dL — ABNORMAL LOW (ref 13.0–17.0)
MCH: 31.9 pg (ref 26.0–34.0)
MCHC: 31.7 g/dL (ref 30.0–36.0)
MCV: 100.5 fL — ABNORMAL HIGH (ref 80.0–100.0)
Platelets: 234 10*3/uL (ref 150–400)
RBC: 3.95 MIL/uL — ABNORMAL LOW (ref 4.22–5.81)
RDW: 14.1 % (ref 11.5–15.5)
WBC: 7.4 10*3/uL (ref 4.0–10.5)
nRBC: 0 % (ref 0.0–0.2)

## 2018-12-09 LAB — BRAIN NATRIURETIC PEPTIDE: B Natriuretic Peptide: 3559.2 pg/mL — ABNORMAL HIGH (ref 0.0–100.0)

## 2018-12-09 LAB — MAGNESIUM: Magnesium: 2.7 mg/dL — ABNORMAL HIGH (ref 1.7–2.4)

## 2018-12-09 LAB — TROPONIN I (HIGH SENSITIVITY)
Troponin I (High Sensitivity): 45 ng/L — ABNORMAL HIGH (ref <18)
Troponin I (High Sensitivity): 52 ng/L — ABNORMAL HIGH (ref <18)

## 2018-12-09 MED ORDER — FUROSEMIDE 10 MG/ML IJ SOLN
40.0000 mg | Freq: Once | INTRAMUSCULAR | Status: AC
  Administered 2018-12-09: 40 mg via INTRAVENOUS
  Filled 2018-12-09: qty 4

## 2018-12-09 MED ORDER — POTASSIUM CHLORIDE CRYS ER 20 MEQ PO TBCR
20.0000 meq | EXTENDED_RELEASE_TABLET | Freq: Once | ORAL | Status: AC
  Administered 2018-12-09: 20 meq via ORAL
  Filled 2018-12-09: qty 1

## 2018-12-09 NOTE — ED Triage Notes (Signed)
Patient is reporting to [facility] ED after calling his primary care office and being told to come here and have a full workup done to check on his CHF. Patient has no complaints other than that he has SOB when laying down to sleep.

## 2018-12-09 NOTE — ED Provider Notes (Signed)
[facility] [facility] DEPT Provider Note   CSN: [id number] Arrival date & time: 12/09/18  1727     History   Chief Complaint Chief Complaint  Patient presents with   Congestive Heart Failure    HPI [name] [name] is a 85 y.o. male.     HPI Pt started having trouble with shortness of breath in the past week.  He saw his doctor, Dr Joylene Draft and was taken off norvasc and started on lasix.  He noticed improvement but still feels like he gets short of breath at night.  Especially when he lays down.    No fevers or chills.  Slight cough.  Some ankle swelling.  Pt called his doctor today and was told to come to the ED.  Past Medical History:  Diagnosis Date   Aortic valve stenosis    a. mild to moderate by echo 2013   Bradycardia    MDT dual chamber pacemaker   Cancer ([place])    bladder cancer   Chronic kidney disease    Deep venous thrombosis (H[facility])    hx of  LEFT ARM AFTER PACEMAKER INSERTION ABOUT 6 YRS AGO   Depression    Gout    Hypertension    Kidney stone    Macular degeneration    PT STATES HIS VISION STILL OKAY FOR DRIVING   Osteoporosis    Prostate cancer ([place])    AND BLADDER CANCER - S/P URETEROILEAL CONDUIT   Renal disorder    S/P ileal conduit [facility])         Patient Active Problem List   Diagnosis Date Noted   Malfunction of nephrostomy tube ([place]) 06/13/2017   HTN (hypertension) 06/13/2017   Gout 06/13/2017   Legally blind 06/13/2017   Bladder cancer ([place]) 06/13/2017   Blood poisoning    Bacteremia due to group B Streptococcus 02/07/2014   Sepsis ([place]) 02/04/2014   UTI (urinary tract infection) 07/30/2013   ARF (acute renal failure) ([place]) 07/29/2013   CKD (chronic kidney disease) 07/29/2013   Fever, unspecified 07/29/2013   Renal stone 07/27/2013   Hydronephrosis, right 07/15/2013   Infection of urinary tract 07/15/2013   Hyperkalemia 07/15/2013   Kidney stone 08/20/2011   Obstructive uropathy  08/16/2011   Acute renal failure ([place]) 08/16/2011   Leukocytosis 08/16/2011   Gout 08/16/2011   Aortic stenosis 08/16/2011   Hypertension 10/02/2010   Pacemaker 10/02/2010   Sinus bradycardia 10/02/2010    Past Surgical History:  Procedure Laterality Date   07/20/13  CONVERSION OF RIGHT SIDED NEPHROSTOMY CATHETER TO RIGHT SIDED NEPHRO URETERAL CATHETER - DONE IN INTERVENTIONAL RADIOLOGY     APPENDECTOMY     CATARACT EXTRACTION, BILATERAL     CYSTECTOMY     ilial conduit     for bladder cancer   IR GENERIC HISTORICAL  10/26/2015   IR NEPHROSTOMY TUBE CHANGE 10/26/2015 Jacqulynn Cadet, MD [facility]INTER[facility] RAD   IR GENERIC HISTORICAL  12/07/2015   IR NEPHROSTOMY TUBE CHANGE 12/07/2015 Greggory Keen, MD [facility]INTER[facility]   IR GENERIC HISTORICAL  01/11/2016   IR NEPHROSTOMY TUBE CHANGE 01/11/2016 Greggory Keen, MD [facility]   IR GENERIC HISTORICAL  02/08/2016   IR NEPHROSTOMY TUBE CHANGE 02/08/2016 [facility]   IR GENERIC HISTORICAL  03/14/2016   IR NEPHROSTOMY TUBE CHANGE 03/14/2016 Jacqulynn Cadet, MD [facility]   IR GENERIC HISTORICAL  04/22/2016   IR NEPHROSTOMY TUBE CHANGE 04/22/2016 Aletta Edouard, MD [facility]   IR GENERIC HISTORICAL  05/27/2016   IR  NEPHROSTOMY TUBE CHANGE 05/27/2016 Arne Cleveland, MD [facility]   IR NEPHROSTOMY EXCHANGE RIGHT  07/25/2017   IR NEPHROSTOMY EXCHANGE RIGHT  10/13/2017   IR NEPHROSTOMY EXCHANGE RIGHT  11/18/2017   IR NEPHROSTOMY EXCHANGE RIGHT  12/30/2017   IR NEPHROSTOMY EXCHANGE RIGHT  02/10/2018   IR NEPHROSTOMY EXCHANGE RIGHT  03/23/2018   IR NEPHROSTOMY EXCHANGE RIGHT  05/04/2018   IR NEPHROSTOMY EXCHANGE RIGHT  06/25/2018   IR NEPHROSTOMY EXCHANGE RIGHT  08/13/2018   IR NEPHROSTOMY EXCHANGE RIGHT  10/01/2018   IR NEPHROSTOMY EXCHANGE RIGHT  11/19/2018   IR NEPHROSTOMY PLACEMENT RIGHT  06/13/2017   IR NEPHROSTOMY TUBE CHANGE  07/01/2016   IR NEPHROSTOMY TUBE CHANGE  08/05/2016   IR NEPHROSTOMY TUBE CHANGE  09/18/2016    IR NEPHROSTOMY TUBE CHANGE  10/24/2016   IR NEPHROSTOMY TUBE CHANGE  11/18/2016   IR NEPHROSTOMY TUBE CHANGE  12/30/2016   IR NEPHROSTOMY TUBE CHANGE  02/03/2017   IR NEPHROSTOMY TUBE CHANGE  03/13/2017   IR NEPHROSTOMY TUBE CHANGE  04/17/2017   IR NEPHROSTOMY TUBE CHANGE  05/28/2017   IR NEPHROSTOMY TUBE CHANGE  09/04/2017   KIDNEY STONE SURGERY     LYMPHADENECTOMY     NEPHROLITHOTOMY  09/02/2011   Procedure: NEPHROLITHOTOMY PERCUTANEOUS;  Surgeon: Malka So, MD;  Location: [facility] ORS;  Service: Urology;  Laterality: Left;   NEPHROLITHOTOMY Right 07/27/2013   Procedure: RIGHT PERCUTANEOUS NEPHROLITHOTOMY ;  Surgeon: Irine Seal, MD;  Location: [facility];  Service: Urology;  Laterality: Right;   PACEMAKER INSERTION  2009   MDT dual chamber pacemaker implanted by Dr Verlon Setting   PROSTATE SURGERY          Home Medications    Prior to Admission medications   Medication Sig Start Date End Date Taking? Authorizing Provider  acetaminophen (TYLENOL) 500 MG tablet Take 1,000 mg by mouth every 8 (eight) hours as needed for mild pain or headache.   Yes [provider]  aspirin EC 81 MG tablet Take 81 mg by mouth every morning.   Yes [provider]  Besifloxacin HCl (BESIVANCE) 0.6 % SUSP Apply 1 drop to eye See admin instructions. Only uses before eye shot he receives every 10 weeks.   Yes [provider]  Cholecalciferol (VITAMIN D-3) 1000 UNITS CAPS Take 1 capsule by mouth every morning.   Yes [provider]  colchicine 0.6 MG tablet Take 0.6 mg by mouth as needed (gout).    Yes [provider]  docusate sodium (COLACE) 100 MG capsule Take 300 mg by mouth daily.    Yes [provider]  furosemide (LASIX) 20 MG tablet Take 20 mg by mouth daily. 12/04/18  Yes [provider]  Hyprom-Naphaz-Polysorb-Zn Sulf (CLEAR EYES COMPLETE OP) Apply 1-2 drops to eye daily as needed (dry eyes).   Yes [provider]  Multiple  Vitamins-Minerals (VITEYES AREDS ADVANCED PO) Take 2 capsules by mouth daily.   Yes [provider]  OMEGA-3 KRILL OIL PO Take 1 tablet by mouth every morning.   Yes [provider]  polyethylene glycol powder (GLYCOLAX/MIRALAX) 17 GM/SCOOP powder Take 1 Container by mouth daily as needed for mild constipation. Takes 17grams (1 capful) as needed.   Yes [provider]  ULORIC 40 MG tablet Take 1 tablet by mouth daily. 11/24/15  Yes [provider]    Family History Family History  Problem Relation Age of Onset   Prostate cancer Father 13       died  Pancreatic cancer Mother 54       died    Social History Social History   Tobacco Use   Smoking status: Former Smoker    Types: Cigarettes    Quit date: 03/25/1952    Years since quitting: 66.7   Smokeless tobacco: Never Used  Substance Use Topics   Alcohol use: No   Drug use: No     Allergies   Patient has no known allergies.   Review of Systems Review of Systems  All other systems reviewed and are negative.    Physical Exam Updated Vital Signs There were no vitals taken for this visit.  Physical Exam Vitals signs and nursing note reviewed.  Constitutional:      General: He is not in acute distress.    Appearance: He is well-developed.  HENT:     Head: Normocephalic and atraumatic.     Right Ear: External ear normal.     Left Ear: External ear normal.  Eyes:     General: No scleral icterus.       Right eye: No discharge.        Left eye: No discharge.     Conjunctiva/sclera: Conjunctivae normal.  Neck:     Musculoskeletal: Neck supple.     Trachea: No tracheal deviation.  Cardiovascular:     Rate and Rhythm: Normal rate and regular rhythm.  Pulmonary:     Effort: Pulmonary effort is normal. No respiratory distress.     Breath sounds: No stridor. Rales present. No wheezing.     Comments: At the bases, crackles Abdominal:     General: Bowel sounds are normal. There  is no distension.     Palpations: Abdomen is soft.     Tenderness: There is no abdominal tenderness. There is no guarding or rebound.  Musculoskeletal:        General: No tenderness.     Right lower leg: Edema present.     Left lower leg: Edema present.     Comments: Trace edema  Skin:    General: Skin is warm and dry.     Findings: No rash.  Neurological:     Cranial Nerves: No cranial nerve deficit (no facial droop, extraocular movements intact, no slurred speech).     Sensory: No sensory deficit.     Motor: No abnormal muscle tone or seizure activity.     Coordination: Coordination normal.      ED Treatments / Results  Labs (all labs ordered are listed, but only abnormal results are displayed) Labs Reviewed  BASIC METABOLIC PANEL - Abnormal; Notable for the following components:      Result Value   Glucose, Bld 117 (*)    BUN 47 (*)    Creatinine, Ser 2.07 (*)    GFR calc non Af Amer 26 (*)    GFR calc Af Amer 31 (*)    All other components within normal limits  MAGNESIUM - Abnormal; Notable for the following components:   Magnesium 2.7 (*)    All other components within normal limits  BRAIN NATRIURETIC PEPTIDE - Abnormal; Notable for the following components:   B Natriuretic Peptide 3,559.2 (*)    All other components within normal limits  CBC - Abnormal; Notable for the following components:   RBC 3.95 (*)    Hemoglobin 12.6 (*)    MCV 100.5 (*)    All other components within normal limits  URINALYSIS, ROUTINE W REFLEX MICROSCOPIC - Abnormal; Notable for the following  components:   APPearance HAZY (*)    Protein, ur 30 (*)    Leukocytes,Ua LARGE (*)    Bacteria, UA FEW (*)    All other components within normal limits  TSH - Abnormal; Notable for the following components:   TSH 5.300 (*)    All other components within normal limits  COMPREHENSIVE METABOLIC PANEL - Abnormal; Notable for the following components:   BUN 45 (*)    Creatinine, Ser 1.88 (*)     Calcium 8.8 (*)    GFR calc non Af Amer 30 (*)    GFR calc Af Amer 34 (*)    All other components within normal limits  CBC - Abnormal; Notable for the following components:   RBC 3.85 (*)    Hemoglobin 12.0 (*)    HCT 38.2 (*)    All other components within normal limits  TROPONIN I (HIGH SENSITIVITY) - Abnormal; Notable for the following components:   Troponin I (High Sensitivity) 45 (*)    All other components within normal limits  TROPONIN I (HIGH SENSITIVITY) - Abnormal; Notable for the following components:   Troponin I (High Sensitivity) 52 (*)    All other components within normal limits  SARS CORONAVIRUS 2 (TAT 6-24 HRS)  MAGNESIUM  PHOSPHORUS  T4, FREE  T3, FREE    EKG EKG Interpretation  Date/Time:  Wednesday December 09 2018 20:46:04 EDT Ventricular Rate:  79 PR Interval:    QRS Duration: 185 QT Interval:  488 QTC Calculation: 560 R Axis:   56 Text Interpretation:  Sinus rhythm Atrial premature complexes Left bundle branch block No significant change since last tracing Confirmed by Dorie Rank [phone]) on 12/09/2018 9:18:55 PM   Radiology Dg Chest Portable 1 View  Result Date: 12/09/2018 CLINICAL DATA:  CHF, shortness of breath worse when laying down EXAM: PORTABLE CHEST 1 VIEW COMPARISON:  Radiograph 02/04/2014 FINDINGS: Dual lead pacer pack overlies the left chest wall with leads in the right atrium and cardiac apex. Cardiomegaly. Bilateral basilar predominant hazy interstitial opacities. Gradient density towards the bases with obscuration of the hemidiaphragm suggest layering effusion. No pneumothorax. No acute osseous or soft tissue abnormality. Electronic device projects over the left shoulder. IMPRESSION: Findings compatible with CHF including cardiomegaly, pulmonary edema and likely layering effusions. Electronically Signed   By: Lovena Le M.D.   On: 12/09/2018 20:55    Procedures Procedures (including critical care time)  Medications Ordered in  ED Medications  aspirin EC tablet 81 mg (81 mg Oral Given 12/10/18 1027)  febuxostat (ULORIC) tablet 40 mg (40 mg Oral Given 12/10/18 1027)  acetaminophen (TYLENOL) tablet 650 mg (has no administration in time range)    Or  acetaminophen (TYLENOL) suppository 650 mg (has no administration in time range)  HYDROcodone-acetaminophen (NORCO/VICODIN) 5-325 MG per tablet 1-2 tablet (has no administration in time range)  ondansetron (ZOFRAN) tablet 4 mg (has no administration in time range)    Or  ondansetron (ZOFRAN) injection 4 mg (has no administration in time range)  enoxaparin (LOVENOX) injection 30 mg (30 mg Subcutaneous Given 12/10/18 1027)  sodium chloride flush (NS) 0.9 % injection 3 mL (3 mLs Intravenous Given 12/10/18 1028)  sodium chloride flush (NS) 0.9 % injection 3 mL (has no administration in time range)  0.9 %  sodium chloride infusion (has no administration in time range)  furosemide (LASIX) tablet 40 mg (40 mg Oral Given 12/10/18 1815)  furosemide (LASIX) injection 40 mg (40 mg Intravenous Given 12/09/18 2235)  potassium  chloride SA (K-DUR) CR tablet 20 mEq (20 mEq Oral Given 12/09/18 2316)     Initial Impression / Assessment and Plan / ED Course  I have reviewed the triage vital signs and the nursing notes.  Pertinent labs & imaging results that were available during my care of the patient were reviewed by me and considered in my medical decision making (see chart for details).   Pt presented with worsening shortness of breath despite outpt treatment for CHF.  Ed workup c/w chf exacerbation.  Known AS and renal insufficiency.  Elevated trop c/w chf, doubt acute ischemia.  Failed outpt tx.  Admit for further treatment.  Final Clinical Impressions(s) / ED Diagnoses   Final diagnoses:  Acute congestive heart failure, unspecified heart failure type [facility])     Dorie Rank, MD 12/10/18 2039

## 2018-12-09 NOTE — H&P (Signed)
[name] [name] ZOX:[id number] DOB: 1922/03/21 DOA: 12/09/2018     PCP: Crist Infante, MD   Outpatient Specialists:   CARDS:  Dr. Lovena Le   Urology Dr. Jeffie Pollock  Patient arrived to ER on 12/09/18 at 1727  Patient coming from: home Lives alone  With family next door    Chief Complaint:  Chief Complaint  Patient presents with  . Congestive Heart Failure    HPI: [name] [name] is a 85 y.o. 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 Initially his leg edema was felt to be secondary to Norvasc he was taken off for that and started on Lasix by his primary care provider it felt like he had some improvement but continued to be short of breath no chest pain nonfebrile he has some lower extremity edema. He gets meals on wheals low potasium diet  Seen by his primary care provider and was told to come to emergency department and have a full work-up in which worked up for CHF.   Infectious risk factors:  Reports  shortness of breath, dry cough,   In  ER RAPID COVID TEST  in house testing  Pending  No results found for: SARSCOV2NAA   Regarding pertinent Chronic problems:    HTN on lasix   CHF diastolic - last echo 8119 History of aortic stenosis, on Lasix 20 mg a day       CKD stage III - baseline Cr 2.0 Lab Results  Component Value Date   CREATININE 2.07 (H) 12/09/2018   CREATININE 1.90 (H) 06/15/2017   CREATININE 2.01 (H) 06/14/2017    While in ER:  The following Work up has been ordered so far:  Orders Placed This Encounter  Procedures  . SARS CORONAVIRUS 2 (TAT 6-24 HRS) Nasopharyngeal Nasopharyngeal Swab  . DG Chest Portable 1 View  . Basic metabolic panel   . Magnesium  . Brain natriuretic peptide (order ONLY if patient c/o SOB)  . CBC  . Cardiac monitoring  . Height and weight  . Initiate Carrier Fluid Protocol  . If O2 sat If O2 sat < 94% administer O2 at 2 liters/minute via nasal canula  . Consult to hospitalist  ALL PATIENTS BEING ADMITTED/HAVING PROCEDURES NEED COVID-19 SCREENING  . Pulse oximetry, continuous  . ED EKG  . EKG 12-Lead  . Insert peripheral IV  . Saline lock IV    Following Medications were ordered in ER: Medications  potassium chloride SA (K-DUR) CR tablet 20 mEq (has no administration in time range)  furosemide (LASIX) injection 40 mg (40 mg Intravenous Given 12/09/18 2235)        Consult Orders  (From admission, onward)         Start     Ordered   12/09/18 2226  Consult to hospitalist  ALL PATIENTS BEING ADMITTED/HAVING PROCEDURES NEED COVID-19 SCREENING  Once    Comments: ALL PATIENTS BEING ADMITTED/HAVING PROCEDURES NEED COVID-19 SCREENING  Provider:  (Not yet assigned)  Question Answer Comment  Place call to: [facility]   Reason for Consult Admit      12/09/18 2225          Significant initial  Findings: Abnormal Labs Reviewed  BASIC METABOLIC PANEL - Abnormal; Notable for the following components:      Result Value   Glucose, Bld 117 (*)    BUN 47 (*)    Creatinine, Ser 2.07 (*)    GFR calc non Af Amer 26 (*)    GFR calc Af Amer 31 (*)    All other components within normal limits  MAGNESIUM - Abnormal; Notable for the following components:   Magnesium 2.7 (*)    All other components within normal limits  BRAIN NATRIURETIC PEPTIDE - Abnormal; Notable for the following components:   B Natriuretic Peptide 3,559.2 (*)    All other components within normal limits  CBC - Abnormal; Notable for the following components:   RBC 3.95 (*)    Hemoglobin 12.6 (*)    MCV 100.5 (*)    All other components within normal limits  TROPONIN I (HIGH SENSITIVITY) - Abnormal; Notable for the  following components:   Troponin I (High Sensitivity) 45 (*)    All other components within normal limits    Otherwise labs showing:    Recent Labs  Lab 12/09/18 2042  NA 142  K 4.3  CO2 27  GLUCOSE 117*  BUN 47*  CREATININE 2.07*  CALCIUM 9.1  MG 2.7*    Cr   Stable,  Lab Results  Component Value Date   CREATININE 2.07 (H) 12/09/2018   CREATININE 1.90 (H) 06/15/2017   CREATININE 2.01 (H) 06/14/2017    No results for input(s): AST, ALT, ALKPHOS, BILITOT, PROT, ALBUMIN in the last 168 hours. Lab Results  Component Value Date   CALCIUM 9.1 12/09/2018   PHOS 4.2 02/05/2014      WBC      Component Value Date/Time   WBC 7.4 12/09/2018 2042   ANC    Component Value Date/Time   NEUTROABS 5.1 06/13/2017 1009   ALC No components found for: LYMPHAB    Plt: Lab Results  Component Value Date   PLT 234 12/09/2018    Lactic Acid, Venous    Component Value Date/Time   LATICACIDVEN 1.86 02/04/2014 2125    COVID-19 Labs   No results found for: SARSCOV2NAA   HG/HCT  Stable,     Component Value Date/Time   HGB 12.6 (L) 12/09/2018 2042   HCT 39.7 12/09/2018 2042     Troponin 45 ordered     ECG: Ordered Personally reviewed by me showing: HR :79 Rhythm:  Paced  no evidence of ischemic changes QTC 450   BNP (last 3 results) Recent Labs    12/09/18 2042  BNP 3,559.2*      UA  ordered     Ordered    CXR - CHF     ED Triage Vitals  Enc Vitals Group     BP 12/09/18 2033 (!) 146/85     Pulse Rate 12/09/18 2033 74     Resp 12/09/18 2033 (!) 22     Temp 12/09/18 2033 97.8 F (36.6 C)     Temp Source 12/09/18 2033 Oral     SpO2 12/09/18 2033 99 %     Weight 12/09/18 2033 158 lb (71.7 kg)     Height 12/09/18 2033 5' 7.5" (1.715 m)     Head Circumference --      Peak Flow --      Pain Score 12/09/18 1750 6     Pain Loc --      Pain Edu? --      Excl. in [place]? --  YIRS(85)@       Latest  Blood pressure 119/73, pulse 76, temperature 97.8  F (36.6 C), temperature source Oral, resp. rate 18, height 5' 7.5" (1.715 m), weight 71.7 kg, SpO2 100 %.    Hospitalist was called for admission for CHF exacerbation   Review of Systems:    Pertinent positives include: shortness of breath at rest.  dyspnea on exertion, Orthopnea  Constitutional:  No weight loss, night sweats, Fevers, chills, fatigue, weight loss  HEENT:  No headaches, Difficulty swallowing,Tooth/dental problems,Sore throat,  No sneezing, itching, ear ache, nasal congestion, post nasal drip,  Cardio-vascular:  No chest pain, Orthopnea, PND, anasarca, dizziness, palpitations.no Bilateral lower extremity swelling  GI:  No heartburn, indigestion, abdominal pain, nausea, vomiting, diarrhea, change in bowel habits, loss of appetite, melena, blood in stool, hematemesis Resp:  no  No excess mucus, no productive cough, No non-productive cough, No coughing up of blood.No change in color of mucus.No wheezing. Skin:  no rash or lesions. No jaundice GU:  no dysuria, change in color of urine, no urgency or frequency. No straining to urinate.  No flank pain.  Musculoskeletal:  No joint pain or no joint swelling. No decreased range of motion. No back pain.  Psych:  No change in mood or affect. No depression or anxiety. No memory loss.  Neuro: no localizing neurological complaints, no tingling, no weakness, no double vision, no gait abnormality, no slurred speech, no confusion  All systems reviewed and apart from [place] all are negative  Past Medical History:   Past Medical History:  Diagnosis Date  . Aortic valve stenosis    a. mild to moderate by echo 2013  . Bradycardia    MDT dual chamber pacemaker  . Cancer [facility])    bladder cancer  . Chronic kidney disease   . Deep venous thrombosis (HCC)    hx of  LEFT ARM AFTER PACEMAKER INSERTION ABOUT 6 YRS AGO  . Depression   . Gout   . Hypertension   . Kidney stone   . Macular degeneration    PT STATES HIS VISION STILL  OKAY FOR DRIVING  . Osteoporosis   . Prostate cancer ([place])    AND BLADDER CANCER - S/P URETEROILEAL CONDUIT  . Renal disorder   . S/P ileal conduit [facility])           Past Surgical History:  Procedure Laterality Date  . 07/20/13  CONVERSION OF RIGHT SIDED NEPHROSTOMY CATHETER TO RIGHT SIDED NEPHRO URETERAL CATHETER - DONE IN INTERVENTIONAL RADIOLOGY    . APPENDECTOMY    . CATARACT EXTRACTION, BILATERAL    . CYSTECTOMY    . ilial conduit     for bladder cancer  . IR GENERIC HISTORICAL  10/26/2015   IR NEPHROSTOMY TUBE CHANGE 10/26/2015 Jacqulynn Cadet, MD [facility]-INTERV RAD  . IR GENERIC HISTORICAL  12/07/2015   IR NEPHROSTOMY TUBE CHANGE 12/07/2015 Greggory Keen, MD [facility]  . IR GENERIC HISTORICAL  01/11/2016   IR NEPHROSTOMY TUBE CHANGE 01/11/2016 Greggory Keen, MD [facility]  . IR GENERIC HISTORICAL  02/08/2016   IR NEPHROSTOMY TUBE CHANGE 02/08/2016 [facility]  . IR GENERIC HISTORICAL  03/14/2016   IR NEPHROSTOMY TUBE CHANGE 03/14/2016 Jacqulynn Cadet, MD [facility]  . IR GENERIC HISTORICAL  04/22/2016   IR NEPHROSTOMY TUBE CHANGE 04/22/2016 Aletta Edouard, MD [facility]  . IR GENERIC HISTORICAL  05/27/2016   IR NEPHROSTOMY TUBE CHANGE 05/27/2016 Arne Cleveland, MD [facility]  . IR NEPHROSTOMY EXCHANGE RIGHT  07/25/2017  . IR NEPHROSTOMY EXCHANGE RIGHT  10/13/2017  . IR NEPHROSTOMY EXCHANGE RIGHT  11/18/2017  . IR NEPHROSTOMY EXCHANGE RIGHT  12/30/2017  . IR NEPHROSTOMY EXCHANGE RIGHT  02/10/2018  . IR NEPHROSTOMY EXCHANGE RIGHT  03/23/2018  . IR NEPHROSTOMY EXCHANGE RIGHT  05/04/2018  . IR NEPHROSTOMY EXCHANGE RIGHT  06/25/2018  . IR NEPHROSTOMY EXCHANGE RIGHT  08/13/2018  . IR NEPHROSTOMY EXCHANGE RIGHT  10/01/2018  . IR NEPHROSTOMY EXCHANGE RIGHT  11/19/2018  . IR NEPHROSTOMY PLACEMENT RIGHT  06/13/2017  . IR NEPHROSTOMY TUBE CHANGE  07/01/2016  . IR NEPHROSTOMY TUBE CHANGE  08/05/2016  . IR NEPHROSTOMY TUBE CHANGE  09/18/2016  . IR NEPHROSTOMY TUBE CHANGE  10/24/2016  . IR  NEPHROSTOMY TUBE CHANGE  11/18/2016  . IR NEPHROSTOMY TUBE CHANGE  12/30/2016  . IR NEPHROSTOMY TUBE CHANGE  02/03/2017  . IR NEPHROSTOMY TUBE CHANGE  03/13/2017  . IR NEPHROSTOMY TUBE CHANGE  04/17/2017  . IR NEPHROSTOMY TUBE CHANGE  05/28/2017  . IR NEPHROSTOMY TUBE CHANGE  09/04/2017  . KIDNEY STONE SURGERY    . LYMPHADENECTOMY    . NEPHROLITHOTOMY  09/02/2011   Procedure: NEPHROLITHOTOMY PERCUTANEOUS;  Surgeon: Malka So, MD;  Location: [facility];  Service: Urology;  Laterality: Left;  . NEPHROLITHOTOMY Right 07/27/2013   Procedure: RIGHT PERCUTANEOUS NEPHROLITHOTOMY ;  Surgeon: Irine Seal, MD;  Location: [facility];  Service: Urology;  Laterality: Right;  . PACEMAKER INSERTION  2009   MDT dual chamber pacemaker implanted by Dr Verlon Setting  . PROSTATE SURGERY      Social History:  Ambulatory  Kasandra Knudsen,      reports that he quit smoking about 66 years ago. His smoking use included cigarettes. He has never used smokeless tobacco. He reports that he does not drink alcohol or use drugs.     Family History:   Family History  Problem Relation Age of Onset  . Prostate cancer Father 42       died  . Pancreatic cancer Mother 57       died    Allergies: No Known Allergies   Prior to Admission medications   Medication Sig Start Date End Date Taking? Authorizing Provider  acetaminophen (TYLENOL) 500 MG tablet Take 1,000 mg by mouth every 8 (eight) hours as needed for mild pain or headache.   Yes [provider]  aspirin EC 81 MG tablet Take 81 mg by mouth every morning.   Yes [provider]  Besifloxacin HCl (BESIVANCE) 0.6 % SUSP Apply 1 drop to eye See admin instructions. Only uses before eye shot he receives every 10 weeks.   Yes [provider]  Cholecalciferol (VITAMIN D-3) 1000 UNITS CAPS Take 1 capsule by mouth every morning.   Yes [provider]  colchicine 0.6 MG tablet Take 0.6 mg by mouth as needed (gout).    Yes [provider]  docusate  sodium (COLACE) 100 MG capsule Take 300 mg by mouth daily.    Yes [provider]  furosemide (LASIX) 20 MG tablet Take 20 mg by mouth daily. 12/04/18  Yes [provider]  Hyprom-Naphaz-Polysorb-Zn Sulf (CLEAR EYES COMPLETE OP) Apply 1-2 drops to eye daily as needed (dry eyes).   Yes [provider]  Multiple Vitamins-Minerals (VITEYES AREDS ADVANCED PO) Take 2 capsules by mouth daily.   Yes [provider]  OMEGA-3 KRILL OIL PO Take 1 tablet by mouth every morning.   Yes [provider]  polyethylene glycol powder (GLYCOLAX/MIRALAX) 17 GM/SCOOP  powder Take 1 Container by mouth daily as needed for mild constipation. Takes 17grams (1 capful) as needed.   Yes [provider]  ULORIC 40 MG tablet Take 1 tablet by mouth daily. 11/24/15  Yes [provider]   Physical Exam: Blood pressure 119/73, pulse 76, temperature 97.8 F (36.6 C), temperature source Oral, resp. rate 18, height 5' 7.5" (1.715 m), weight 71.7 kg, SpO2 100 %. 1. General:  in No  Acute distress   Chronically ill-appearing 2. Psychological: Alert and  Oriented 3. Head/ENT:   Moist  Mucous Membranes                          Head Non traumatic, neck supple                            Poor Dentition 4. SKIN: normal   Skin turgor,  Skin clean Dry and intact no rash 5. Heart: Regular rate and rhythm no  Murmur, no Rub or gallop 6. Lungs:   no wheezes or crackles   7. Abdomen: Soft,  non-tender, Non distended    bowel sounds present 8. Lower extremities: no clubbing, cyanosis, mild edema 9. Neurologically Grossly intact, moving all 4 extremities equally   10. MSK: Normal range of motion   All other LABS:     Recent Labs  Lab 12/09/18 2042  WBC 7.4  HGB 12.6*  HCT 39.7  MCV 100.5*  PLT 234     Recent Labs  Lab 12/09/18 2042  NA 142  K 4.3  CL 103  CO2 27  GLUCOSE 117*  BUN 47*  CREATININE 2.07*  CALCIUM 9.1  MG 2.7*     No results for input(s): AST,  ALT, ALKPHOS, BILITOT, PROT, ALBUMIN in the last 168 hours.     Cultures:    Component Value Date/Time   SDES BLOOD BLOOD RIGHT FOREARM 02/05/2014 0140   SPECREQUEST BOTTLES DRAWN AEROBIC AND ANAEROBIC 5CC 02/05/2014 0140   CULT  02/05/2014 0140    GROUP B STREP(S.AGALACTIAE)ISOLATED Note: Gram Stain Report Called to,Read Back By and Verified With: JENNY BURNS 02/05/14 @ 7:18PM BY RUSCOE A. Performed at [place] 02/07/2014 FINAL 02/05/2014 0140     Radiological Exams on Admission: Dg Chest Portable 1 View  Result Date: 12/09/2018 CLINICAL DATA:  CHF, shortness of breath worse when laying down EXAM: PORTABLE CHEST 1 VIEW COMPARISON:  Radiograph 02/04/2014 FINDINGS: Dual lead pacer pack overlies the left chest wall with leads in the right atrium and cardiac apex. Cardiomegaly. Bilateral basilar predominant hazy interstitial opacities. Gradient density towards the bases with obscuration of the hemidiaphragm suggest layering effusion. No pneumothorax. No acute osseous or soft tissue abnormality. Electronic device projects over the left shoulder. IMPRESSION: Findings compatible with CHF including cardiomegaly, pulmonary edema and likely layering effusions. Electronically Signed   By: Lovena Le M.D.   On: 12/09/2018 20:55    Chart has been reviewed    Assessment/Plan  85 y.o. male with medical history significant of chronic diastolic CHF Aortic valve stenosis, bladder cancer, CKD, history of DVT after pacemaker insertion in his left arm, gout, hypertension, kidney stones, sp pacemaker, legAly blind, Admitted for CHF exacerbation   Present on Admission: . Acute on chronic diastolic CHF (congestive heart failure) ([place]) -  - admit on telemetry,  cycle cardiac enzymes,  Trop 45 -> 52 obtain serial ECG  to  evaluate for ischemia as a cause of heart failure  monitor daily weight:  Filed Weights   12/09/18 2033  Weight: 71.7 kg   Last BNP BNP (last 3 results)  Recent Labs    12/09/18 2042  BNP 3,559.2*    ProBNP (last 3 results) No results for input(s): PROBNP in the last 8760 hours.   diurese with IV lasix and monitor orthostatics and creatinine to avoid over diuresis.  Order echogram to evaluate EF and valves  ACE/ARBi  Contraindicated    cardiology consulted through email   . CKD (chronic kidney disease) chronic continue to monitor avoid nephrotoxic medications  . Hypertension -chronic continue home medications  . Pacemaker -chronic stable currently paced  . Hydronephrosis, right -patient with complex u urological history status post ileostomy currently appears to be stable good urine output     Other plan as per orders.  DVT prophylaxis:    Lovenox     Code Status:    DNR/DNI   as per family  I had personally discussed CODE STATUS with  family   Family Communication:   Family not at  Bedside  plan of care was discussed on the phone with   Daughter,   Disposition Plan:       To home once workup is complete and patient is stable    pt/OT PRIOR TO DISCHARGE  Consults called:   emailed to cardiology   Admission status:  ED Disposition    None      Obs      Level of care     tele  For  24H     Precautions:   Droplet  No active isolations  PPE: Used by the provider:   P100  eye Goggles,  Gloves    Miracle Mongillo 12/10/2018, 1:57 AM    [facility]     after 2 AM please page floor coverage PA If 7AM-7PM, please contact the day team taking care of the patient using [email or web address]

## 2018-12-10 ENCOUNTER — Observation Stay (HOSPITAL_BASED_OUTPATIENT_CLINIC_OR_DEPARTMENT_OTHER): Payer: Medicare HMO

## 2018-12-10 ENCOUNTER — Encounter (HOSPITAL_COMMUNITY): Payer: Self-pay

## 2018-12-10 DIAGNOSIS — I130 Hypertensive heart and chronic kidney disease with heart failure and stage 1 through stage 4 chronic kidney disease, or unspecified chronic kidney disease: Secondary | ICD-10-CM | POA: Diagnosis not present

## 2018-12-10 DIAGNOSIS — I350 Nonrheumatic aortic (valve) stenosis: Secondary | ICD-10-CM

## 2018-12-10 DIAGNOSIS — I5043 Acute on chronic combined systolic (congestive) and diastolic (congestive) heart failure: Secondary | ICD-10-CM | POA: Diagnosis not present

## 2018-12-10 DIAGNOSIS — H548 Legal blindness, as defined in USA: Secondary | ICD-10-CM | POA: Diagnosis not present

## 2018-12-10 DIAGNOSIS — N183 Chronic kidney disease, stage 3 (moderate): Secondary | ICD-10-CM | POA: Diagnosis not present

## 2018-12-10 DIAGNOSIS — I5023 Acute on chronic systolic (congestive) heart failure: Secondary | ICD-10-CM | POA: Diagnosis not present

## 2018-12-10 DIAGNOSIS — J90 Pleural effusion, not elsewhere classified: Secondary | ICD-10-CM | POA: Diagnosis not present

## 2018-12-10 DIAGNOSIS — Z20828 Contact with and (suspected) exposure to other viral communicable diseases: Secondary | ICD-10-CM | POA: Diagnosis not present

## 2018-12-10 DIAGNOSIS — I10 Essential (primary) hypertension: Secondary | ICD-10-CM | POA: Diagnosis not present

## 2018-12-10 DIAGNOSIS — I361 Nonrheumatic tricuspid (valve) insufficiency: Secondary | ICD-10-CM | POA: Diagnosis not present

## 2018-12-10 DIAGNOSIS — I5021 Acute systolic (congestive) heart failure: Secondary | ICD-10-CM

## 2018-12-10 DIAGNOSIS — I351 Nonrheumatic aortic (valve) insufficiency: Secondary | ICD-10-CM | POA: Diagnosis not present

## 2018-12-10 DIAGNOSIS — Z66 Do not resuscitate: Secondary | ICD-10-CM | POA: Diagnosis not present

## 2018-12-10 DIAGNOSIS — I5033 Acute on chronic diastolic (congestive) heart failure: Secondary | ICD-10-CM | POA: Diagnosis not present

## 2018-12-10 DIAGNOSIS — I313 Pericardial effusion (noninflammatory): Secondary | ICD-10-CM | POA: Diagnosis not present

## 2018-12-10 DIAGNOSIS — I083 Combined rheumatic disorders of mitral, aortic and tricuspid valves: Secondary | ICD-10-CM | POA: Diagnosis not present

## 2018-12-10 DIAGNOSIS — R2681 Unsteadiness on feet: Secondary | ICD-10-CM | POA: Diagnosis not present

## 2018-12-10 LAB — COMPREHENSIVE METABOLIC PANEL
ALT: 14 U/L (ref 0–44)
AST: 20 U/L (ref 15–41)
Albumin: 3.5 g/dL (ref 3.5–5.0)
Alkaline Phosphatase: 40 U/L (ref 38–126)
Anion gap: 8 (ref 5–15)
BUN: 45 mg/dL — ABNORMAL HIGH (ref 8–23)
CO2: 27 mmol/L (ref 22–32)
Calcium: 8.8 mg/dL — ABNORMAL LOW (ref 8.9–10.3)
Chloride: 106 mmol/L (ref 98–111)
Creatinine, Ser: 1.88 mg/dL — ABNORMAL HIGH (ref 0.61–1.24)
GFR calc Af Amer: 34 mL/min — ABNORMAL LOW (ref >60)
GFR calc non Af Amer: 30 mL/min — ABNORMAL LOW (ref >60)
Glucose, Bld: 82 mg/dL (ref 70–99)
Potassium: 4.2 mmol/L (ref 3.5–5.1)
Sodium: 141 mmol/L (ref 135–145)
Total Bilirubin: 0.6 mg/dL (ref 0.3–1.2)
Total Protein: 7.1 g/dL (ref 6.5–8.1)

## 2018-12-10 LAB — ECHOCARDIOGRAM COMPLETE
Height: 67.5 in
Weight: 2359.8 oz

## 2018-12-10 LAB — CBC
HCT: 38.2 % — ABNORMAL LOW (ref 39.0–52.0)
Hemoglobin: 12 g/dL — ABNORMAL LOW (ref 13.0–17.0)
MCH: 31.2 pg (ref 26.0–34.0)
MCHC: 31.4 g/dL (ref 30.0–36.0)
MCV: 99.2 fL (ref 80.0–100.0)
Platelets: 228 10*3/uL (ref 150–400)
RBC: 3.85 MIL/uL — ABNORMAL LOW (ref 4.22–5.81)
RDW: 13.8 % (ref 11.5–15.5)
WBC: 6.8 10*3/uL (ref 4.0–10.5)
nRBC: 0 % (ref 0.0–0.2)

## 2018-12-10 LAB — TSH: TSH: 5.3 u[IU]/mL — ABNORMAL HIGH (ref 0.350–4.500)

## 2018-12-10 LAB — MAGNESIUM: Magnesium: 2.4 mg/dL (ref 1.7–2.4)

## 2018-12-10 LAB — PHOSPHORUS: Phosphorus: 3.4 mg/dL (ref 2.5–4.6)

## 2018-12-10 LAB — T4, FREE: Free T4: 0.98 ng/dL (ref 0.61–1.12)

## 2018-12-10 LAB — SARS CORONAVIRUS 2 (TAT 6-24 HRS): SARS Coronavirus 2: NEGATIVE

## 2018-12-10 MED ORDER — FUROSEMIDE 10 MG/ML IJ SOLN
40.0000 mg | Freq: Two times a day (BID) | INTRAMUSCULAR | Status: DC
  Administered 2018-12-10: 40 mg via INTRAVENOUS
  Filled 2018-12-10: qty 4

## 2018-12-10 MED ORDER — ONDANSETRON HCL 4 MG PO TABS: 4.0000 mg | ORAL_TABLET | Freq: Four times a day (QID) | ORAL | Status: DC | PRN

## 2018-12-10 MED ORDER — FEBUXOSTAT 40 MG PO TABS
40.0000 mg | ORAL_TABLET | Freq: Every day | ORAL | Status: DC
  Administered 2018-12-10 – 2018-12-12 (×3): 40 mg via ORAL
  Filled 2018-12-10 (×3): qty 1

## 2018-12-10 MED ORDER — ENOXAPARIN SODIUM 30 MG/0.3ML ~~LOC~~ SOLN
30.0000 mg | Freq: Every day | SUBCUTANEOUS | Status: DC
  Administered 2018-12-10 – 2018-12-11 (×2): 30 mg via SUBCUTANEOUS
  Filled 2018-12-10 (×4): qty 0.3

## 2018-12-10 MED ORDER — HYDROCODONE-ACETAMINOPHEN 5-325 MG PO TABS: 1.0000 | ORAL_TABLET | ORAL | Status: DC | PRN

## 2018-12-10 MED ORDER — ONDANSETRON HCL 4 MG/2ML IJ SOLN: 4.0000 mg | Freq: Four times a day (QID) | INTRAMUSCULAR | Status: DC | PRN

## 2018-12-10 MED ORDER — FUROSEMIDE 40 MG PO TABS
40.0000 mg | ORAL_TABLET | Freq: Two times a day (BID) | ORAL | Status: DC
  Administered 2018-12-10 – 2018-12-11 (×2): 40 mg via ORAL
  Filled 2018-12-10 (×2): qty 1

## 2018-12-10 MED ORDER — ASPIRIN EC 81 MG PO TBEC
81.0000 mg | DELAYED_RELEASE_TABLET | Freq: Every day | ORAL | Status: DC
  Administered 2018-12-10 – 2018-12-12 (×3): 81 mg via ORAL
  Filled 2018-12-10 (×3): qty 1

## 2018-12-10 MED ORDER — SODIUM CHLORIDE 0.9% FLUSH
3.0000 mL | Freq: Two times a day (BID) | INTRAVENOUS | Status: DC
  Administered 2018-12-10 – 2018-12-12 (×5): 3 mL via INTRAVENOUS

## 2018-12-10 MED ORDER — ACETAMINOPHEN 650 MG RE SUPP: 650.0000 mg | Freq: Four times a day (QID) | RECTAL | Status: DC | PRN

## 2018-12-10 MED ORDER — ACETAMINOPHEN 325 MG PO TABS: 650.0000 mg | ORAL_TABLET | Freq: Four times a day (QID) | ORAL | Status: DC | PRN

## 2018-12-10 MED ORDER — SODIUM CHLORIDE 0.9% FLUSH: 3.0000 mL | INTRAVENOUS | Status: DC | PRN

## 2018-12-10 MED ORDER — SODIUM CHLORIDE 0.9 % IV SOLN: 250.0000 mL | INTRAVENOUS | Status: DC | PRN

## 2018-12-10 NOTE — Evaluation (Signed)
Physical Therapy Evaluation Patient Details Name: [name] [name] MRN: [id number] DOB: 07-11-1921 Today's Date: 12/10/2018   History of Present Illness  This 85 year old man was admitted for CKD/CHF exacerbation.  PMH:  HTN, Aortic Valve stenosis, gout, bladder and prostate CA, urostomy and nephrostomy, DVT, depression, macular degeneration, and pacemaker  Clinical Impression  Pt admitted with above diagnosis.  Pt currently with functional limitations due to the deficits listed below (see PT Problem List). Pt will benefit from skilled PT to increase their independence and safety with mobility to allow discharge to the venue listed below.  Pt ambulated 400 feet around unit!  Pt likely close to his baseline however would recommend some supervision upon d/c initially for safety.  Pt reports ambulating daily prior to admission.     Follow Up Recommendations No PT follow up;Supervision - Intermittent    Equipment Recommendations  None recommended by PT    Recommendations for Other Services       Precautions / Restrictions Precautions Precautions: Fall Precaution Comments: urostomy; nephrostomy; wears belt to hold nephrostomy; usually puts through his suspenders to keep up Restrictions Weight Bearing Restrictions: No      Mobility  Bed Mobility Overal bed mobility: Needs Assistance Bed Mobility: Supine to Sit     Supine to sit: Min guard     General bed mobility comments: pt up in recliner on arrival  Transfers Overall transfer level: Needs assistance Equipment used: None Transfers: Sit to/from Stand Sit to Stand: Min guard         General transfer comment: min/guard for safety, pt utilized armrests of recliner  Ambulation/Gait Ambulation/Gait assistance: Counsellor (Feet): 400 Feet Assistive device: Straight cane Gait Pattern/deviations: Step-through pattern;Decreased stride length     General Gait Details: occasionally unsteady however pt self  corrects, pt also reports he sometimes uses "stick" in his other hand (typically walks with [facility]), pt denies any symptoms  Stairs            Wheelchair Mobility    Modified Rankin (Stroke Patients Only)       Balance Overall balance assessment: Mild deficits observed, not formally tested(pt reports his last fall was "a long time ago")                                           Pertinent Vitals/Pain Pain Assessment: No/denies pain    Home Living Family/patient expects to be discharged to:: Private residence Living Arrangements: Alone   Type of Home: House       Home Layout: Two level;Able to live on main level with bedroom/bathroom Home Equipment: Gilford Rile - 2 wheels;Cane - single point;Shower seat Additional Comments: usually sponge bathes    Prior Function Level of Independence: Independent with assistive device(s)         Comments: likes to walk daily; daughter lives next door     Hand Dominance        Extremity/Trunk Assessment   Upper Extremity Assessment Upper Extremity Assessment: Generalized weakness    Lower Extremity Assessment Lower Extremity Assessment: Generalized weakness       Communication   Communication: HOH  Cognition Arousal/Alertness: Awake/alert Behavior During Therapy: WFL for tasks assessed/performed Overall Cognitive Status: Within Functional Limits for tasks assessed  General Comments      Exercises     Assessment/Plan    PT Assessment Patient needs continued PT services  PT Problem List Decreased strength;Decreased mobility;Decreased balance;Decreased knowledge of use of DME       PT Treatment Interventions DME instruction;Therapeutic exercise;Gait training;Stair training;Functional mobility training;Therapeutic activities;Patient/family education;Balance training    PT Goals (Current goals can be found in the Care Plan section)  Acute Rehab  PT Goals Patient Stated Goal: none stated; glad to be up to chair PT Goal Formulation: With patient Time For Goal Achievement: 12/17/18 Potential to Achieve Goals: Good    Frequency Min 3X/week   Barriers to discharge        Co-evaluation               AM-PAC PT "6 Clicks" Mobility  Outcome Measure Help needed turning from your back to your side while in a flat bed without using bedrails?: A Little Help needed moving from lying on your back to sitting on the side of a flat bed without using bedrails?: A Little Help needed moving to and from a bed to a chair (including a wheelchair)?: A Little Help needed standing up from a chair using your arms (e.g., wheelchair or bedside chair)?: A Little Help needed to walk in hospital room?: A Little Help needed climbing 3-5 steps with a railing? : A Little 6 Click Score: 18    End of Session Equipment Utilized During Treatment: Gait belt Activity Tolerance: Patient tolerated treatment well Patient left: in chair;with chair alarm set;with call bell/phone within reach   PT Visit Diagnosis: Other abnormalities of gait and mobility (R26.89)    Time: 3833-3832 PT Time Calculation (min) (ACUTE ONLY): 14 min   Charges:   PT Evaluation $PT Eval Low Complexity: [place], PT, DPT Acute Rehabilitation Services Office: [phone] Pager: [phone]  Trena Platt 12/10/2018, 1:10 PM

## 2018-12-10 NOTE — Progress Notes (Signed)
SARS Coronavirus 2 NEGATIVE NEGATIVE   SRP,RN

## 2018-12-10 NOTE — Consult Note (Signed)
Cardiology Consultation:   Patient ID: [name] [name] MRN: [id number]; DOB: [birth year]  Admit date: 12/09/2018 Date of Consult: 12/10/2018  Primary Care Provider: Crist Infante, MD Primary Cardiologist: [name] Peru, MD  Primary Electrophysiologist:  [name] Peru, MD    Patient Profile:   [name] [name] is a 85 y.o. male with a hx of sinus bradycardia s/p MDT dual chamber PPM c/b UE DVT following insertion, mild-moderate AS noted on echo in 8527, chronic diastolic HF, HTN, CKD, h/o prostate and bladder CA, h/o kidney stones, gout and is legally blind who is being seen today for the evaluation of acute on chronic diastolic CHF, at the request of Dr. Lonny [name], Internal Medicine.   History of Present Illness:   [name] [name] is a 85 y/o male, followed by Dr. Lovena [name], w/ a h/o sinus bradycardia s/p MDT dual chamber PPM c/b UE DVT following insertion, mild-moderate AS noted on echo in 7824, chronic diastolic HF, HTN, CKD, h/o prostate and bladder CA, h/o kidney stones, gout and is legally blind.  He lives home alone w/ assistance from family that lives next door. Gets most of his meals through [employer].   He presented to the [facility] ED 9/16 w/ complaints of progressive dyspnea, orthopnea, PND and bilateral LEE. He initially saw is PCP for complaints. LEE felt initially to be 2/2 amlodipine which was discontinued. Lasix was started w/ some improvement in edema, but symptoms progressed and pt advised to come to the ED.   In the ED, BNP markedly elevated at 3,559. CXR showed cardiomegaly w/ pulmonary edema and layering effusions. Scr 2.0 c/w baseline. K normal at 4.3. CBC unremarkable. EKG showed AV dual-paced rhythm with occasional premature ventricular complexes. 80 bpm. 2D echo pending. COVID test still pending. Pt admitted by IM and started on IV Lasix, 40 mg BID. Cardiology asked to assist w/ further management.    Strict I/Os not documented. Doubt weights accurate. -11lb wt difference in a  day, down from 158 lb to 147 lb. His SCr has improved since getting lasix, down from 2.07>>1.88 today. K WNL at 4.2. BP 140/78 this morning. He reports that he is feeling better. Was able to sleep last night w/o orthopnea and PND. Denies CP. No recent syncope/ near syncope.    Heart Pathway Score:     Past Medical History:  Diagnosis Date  . Aortic valve stenosis    a. mild to moderate by echo 2013  . Bradycardia    MDT dual chamber pacemaker  . Cancer [facility])    bladder cancer  . Chronic kidney disease   . Deep venous thrombosis (HCC)    hx of  LEFT ARM AFTER PACEMAKER INSERTION ABOUT 6 YRS AGO  . Depression   . Gout   . Hypertension   . Kidney stone   . Macular degeneration    PT STATES HIS VISION STILL OKAY FOR DRIVING  . Osteoporosis   . Prostate cancer ([facility])    AND BLADDER CANCER - S/P URETEROILEAL CONDUIT  . Renal disorder   . S/P ileal conduit [facility])         Past Surgical History:  Procedure Laterality Date  . 07/20/13  CONVERSION OF RIGHT SIDED NEPHROSTOMY CATHETER TO RIGHT SIDED NEPHRO URETERAL CATHETER - DONE IN INTERVENTIONAL RADIOLOGY    . APPENDECTOMY    . CATARACT EXTRACTION, BILATERAL    . CYSTECTOMY    . ilial conduit     for bladder cancer  . IR GENERIC HISTORICAL  10/26/2015  IR NEPHROSTOMY TUBE CHANGE 10/26/2015 Jacqulynn Cadet, MD [facility]-INTERV RAD  . IR GENERIC HISTORICAL  12/07/2015   IR NEPHROSTOMY TUBE CHANGE 12/07/2015 Greggory Keen, MD [facility]INTER[facility] RAD  . IR GENERIC HISTORICAL  01/11/2016   IR NEPHROSTOMY TUBE CHANGE 01/11/2016 Greggory Keen, MD [facility]INTER[facility]  . IR GENERIC HISTORICAL  02/08/2016   IR NEPHROSTOMY TUBE CHANGE 02/08/2016 [facility]  . IR GENERIC HISTORICAL  03/14/2016   IR NEPHROSTOMY TUBE CHANGE 03/14/2016 Jacqulynn Cadet, MD [facility]INTER[facility]  . IR GENERIC HISTORICAL  04/22/2016   IR NEPHROSTOMY TUBE CHANGE 04/22/2016 Aletta Edouard, MD [facility]INTER[facility]  . IR GENERIC HISTORICAL  05/27/2016   IR NEPHROSTOMY TUBE CHANGE 05/27/2016 Arne Cleveland, MD  [facility]INTERV RAD  . IR NEPHROSTOMY EXCHANGE RIGHT  07/25/2017  . IR NEPHROSTOMY EXCHANGE RIGHT  10/13/2017  . IR NEPHROSTOMY EXCHANGE RIGHT  11/18/2017  . IR NEPHROSTOMY EXCHANGE RIGHT  12/30/2017  . IR NEPHROSTOMY EXCHANGE RIGHT  02/10/2018  . IR NEPHROSTOMY EXCHANGE RIGHT  03/23/2018  . IR NEPHROSTOMY EXCHANGE RIGHT  05/04/2018  . IR NEPHROSTOMY EXCHANGE RIGHT  06/25/2018  . IR NEPHROSTOMY EXCHANGE RIGHT  08/13/2018  . IR NEPHROSTOMY EXCHANGE RIGHT  10/01/2018  . IR NEPHROSTOMY EXCHANGE RIGHT  11/19/2018  . IR NEPHROSTOMY PLACEMENT RIGHT  06/13/2017  . IR NEPHROSTOMY TUBE CHANGE  07/01/2016  . IR NEPHROSTOMY TUBE CHANGE  08/05/2016  . IR NEPHROSTOMY TUBE CHANGE  09/18/2016  . IR NEPHROSTOMY TUBE CHANGE  10/24/2016  . IR NEPHROSTOMY TUBE CHANGE  11/18/2016  . IR NEPHROSTOMY TUBE CHANGE  12/30/2016  . IR NEPHROSTOMY TUBE CHANGE  02/03/2017  . IR NEPHROSTOMY TUBE CHANGE  03/13/2017  . IR NEPHROSTOMY TUBE CHANGE  04/17/2017  . IR NEPHROSTOMY TUBE CHANGE  05/28/2017  . IR NEPHROSTOMY TUBE CHANGE  09/04/2017  . KIDNEY STONE SURGERY    . LYMPHADENECTOMY    . NEPHROLITHOTOMY  09/02/2011   Procedure: NEPHROLITHOTOMY PERCUTANEOUS;  Surgeon: Malka So, MD;  Location: [facility] ORS;  Service: Urology;  Laterality: Left;  . NEPHROLITHOTOMY Right 07/27/2013   Procedure: RIGHT PERCUTANEOUS NEPHROLITHOTOMY ;  Surgeon: Irine Seal, MD;  Location: [facility] ORS;  Service: Urology;  Laterality: Right;  . PACEMAKER INSERTION  2009   MDT dual chamber pacemaker implanted by Dr Verlon Setting  . PROSTATE SURGERY       Home Medications:  Prior to Admission medications   Medication Sig Start Date End Date Taking? Authorizing Provider  acetaminophen (TYLENOL) 500 MG tablet Take 1,000 mg by mouth every 8 (eight) hours as needed for mild pain or headache.   Yes [provider]  aspirin EC 81 MG tablet Take 81 mg by mouth every morning.   Yes [provider]  Besifloxacin HCl (BESIVANCE) 0.6 % SUSP Apply 1 drop to eye See admin  instructions. Only uses before eye shot he receives every 10 weeks.   Yes [provider]  Cholecalciferol (VITAMIN D-3) 1000 UNITS CAPS Take 1 capsule by mouth every morning.   Yes [provider]  colchicine 0.6 MG tablet Take 0.6 mg by mouth as needed (gout).    Yes [provider]  docusate sodium (COLACE) 100 MG capsule Take 300 mg by mouth daily.    Yes [provider]  furosemide (LASIX) 20 MG tablet Take 20 mg by mouth daily. 12/04/18  Yes [provider]  Hyprom-Naphaz-Polysorb-Zn Sulf (CLEAR EYES COMPLETE OP) Apply 1-2 drops to eye daily as needed (dry eyes).   Yes [provider]  Multiple Vitamins-Minerals (VITEYES AREDS  ADVANCED PO) Take 2 capsules by mouth daily.   Yes [provider]  OMEGA-3 KRILL OIL PO Take 1 tablet by mouth every morning.   Yes [provider]  polyethylene glycol powder (GLYCOLAX/MIRALAX) 17 GM/SCOOP powder Take 1 Container by mouth daily as needed for mild constipation. Takes 17grams (1 capful) as needed.   Yes [provider]  ULORIC 40 MG tablet Take 1 tablet by mouth daily. 11/24/15  Yes [provider]    Inpatient Medications: Scheduled Meds: . aspirin EC  81 mg Oral Daily  . enoxaparin (LOVENOX) injection  30 mg Subcutaneous Daily  . febuxostat  40 mg Oral Daily  . furosemide  40 mg Intravenous Q12H  . sodium chloride flush  3 mL Intravenous Q12H   Continuous Infusions: . sodium chloride     PRN Meds: sodium chloride, acetaminophen **OR** acetaminophen, HYDROcodone-acetaminophen, ondansetron **OR** ondansetron (ZOFRAN) IV, sodium chloride flush  Allergies:   No Known Allergies  Social History:   Social History   Socioeconomic History  . Marital status: Married    Spouse name: Not on file  . Number of children: 3  . Years of education: Not on file  . Highest education level: Not on file  Occupational History    Employer: RETIRED  Social Needs  .  Financial resource strain: Not on file  . Food insecurity    Worry: Not on file    Inability: Not on file  . Transportation needs    Medical: Not on file    Non-medical: Not on file  Tobacco Use  . Smoking status: Former Smoker    Types: Cigarettes    Quit date: 03/25/1952    Years since quitting: 66.7  . Smokeless tobacco: Never Used  Substance and Sexual Activity  . Alcohol use: No  . Drug use: No  . Sexual activity: Never  Lifestyle  . Physical activity    Days per week: Not on file    Minutes per session: Not on file  . Stress: Not on file  Relationships  . Social Herbalist on phone: Not on file    Gets together: Not on file    Attends religious service: Not on file    Active member of club or organization: Not on file    Attends meetings of clubs or organizations: Not on file    Relationship status: Not on file  . Intimate partner violence    Fear of current or ex partner: Not on file    Emotionally abused: Not on file    Physically abused: Not on file    Forced sexual activity: Not on file  Other Topics Concern  . Not on file  Social History Narrative   ** Merged History Encounter **        Family History:    Family History  Problem Relation Age of Onset  . Prostate cancer Father 64       died  . Pancreatic cancer Mother 42       died     ROS:  Please see the history of present illness.   All other ROS reviewed and negative.     Physical Exam/Data:   Vitals:   12/10/18 0000 12/10/18 0042 12/10/18 0141 12/10/18 0540  BP: 130/74 (!) 151/81  140/78  Pulse: 76 78  75  Resp: 20 (!) 22  18  Temp:  98 F (36.7 C)  97.6 F (36.4 C)  TempSrc:  Oral  Oral  SpO2: 100% 96%  97%  Weight:   66.9 kg   Height:   5' 7.5" (1.715 m)     Intake/Output Summary (Last 24 hours) at 12/10/2018 0839 Last data filed at 12/10/2018 0545 Gross per 24 hour  Intake -  Output 675 ml  Net -675 ml   Last 3 Weights 12/10/2018 12/09/2018 12/16/2017  Weight (lbs)  147 lb 7.8 oz 158 lb 152 lb 9.6 oz  Weight (kg) 66.9 kg 71.668 kg 69.219 kg     Body mass index is 22.76 kg/m.  General:  Pleasant elderly WM Well nourished, well developed, in no acute distress HEENT: normal Lymph: no adenopathy Neck: no JVD Endocrine:  No thryomegaly Vascular: No carotid bruits; FA pulses 2+ bilaterally without bruits  Cardiac:  normal S1, S2; RRR; 2/6 systolic murmur at RUSB Lungs:  clear to auscultation bilaterally, no wheezing, rhonchi or rales  Abd: soft, nontender, no hepatomegaly  Ext: no edema Musculoskeletal:  No deformities, BUE and BLE strength normal and equal Skin: warm and dry  Neuro:  CNs 2-12 intact, no focal abnormalities noted Psych:  Normal affect   EKG:  The EKG was personally reviewed and demonstrates:  AV dual-paced rhythm with occasional Premature ventricular complexes 80 bpm  Telemetry:  Telemetry was personally reviewed and demonstrates:  Paced rhythm, 80s   Relevant CV Studies: 2D echo pending   Laboratory Data:  High Sensitivity Troponin:   Recent Labs  Lab 12/09/18 2042 12/09/18 2255  TROPONINIHS 45* 52*     Chemistry Recent Labs  Lab 12/09/18 2042 12/10/18 0509  NA 142 141  K 4.3 4.2  CL 103 106  CO2 27 27  GLUCOSE 117* 82  BUN 47* 45*  CREATININE 2.07* 1.88*  CALCIUM 9.1 8.8*  GFRNONAA 26* 30*  GFRAA 31* 34*  ANIONGAP 12 8    Recent Labs  Lab 12/10/18 0509  PROT 7.1  ALBUMIN 3.5  AST 20  ALT 14  ALKPHOS 40  BILITOT 0.6   Hematology Recent Labs  Lab 12/09/18 2042 12/10/18 0509  WBC 7.4 6.8  RBC 3.95* 3.85*  HGB 12.6* 12.0*  HCT 39.7 38.2*  MCV 100.5* 99.2  MCH 31.9 31.2  MCHC 31.7 31.4  RDW 14.1 13.8  PLT 234 228   BNP Recent Labs  Lab 12/09/18 2042  BNP 3,559.2*    DDimer No results for input(s): DDIMER in the last 168 hours.   Radiology/Studies:  Dg Chest Portable 1 View  Result Date: 12/09/2018 CLINICAL DATA:  CHF, shortness of breath worse when laying down EXAM: PORTABLE CHEST 1  VIEW COMPARISON:  Radiograph 02/04/2014 FINDINGS: Dual lead pacer pack overlies the left chest wall with leads in the right atrium and cardiac apex. Cardiomegaly. Bilateral basilar predominant hazy interstitial opacities. Gradient density towards the bases with obscuration of the hemidiaphragm suggest layering effusion. No pneumothorax. No acute osseous or soft tissue abnormality. Electronic device projects over the left shoulder. IMPRESSION: Findings compatible with CHF including cardiomegaly, pulmonary edema and likely layering effusions. Electronically Signed   By: [name] [name] M.D.   On: 12/09/2018 20:55    Assessment and Plan:   KEIGAN TAFOYA is a 85 y.o. male with a hx of sinus bradycardia s/p MDT dual chamber PPM c/b UE DVT following insertion, mild-moderate AS noted on echo in 7829, chronic diastolic HF, HTN, CKD, h/o prostate and bladder CA, h/o kidney stones, gout and is legally blind who is being seen today for the evaluation of acute on chronic diastolic  CHF, at the request of Dr. Lonny [name], Internal Medicine.    1. Acute on Chronic Diastolic CHF: [id number] noted on prior echo in 2013. Presented 9/16 w/ CC of dyspnea, orthopnea and PND. Admit BNP 3,559. CXR showed cardiomegaly w/ pulmonary edema and layering effusions.  Significant improvement in symptoms after IV Lasix   Plan to switch to PO diuretics in the next 24 hrs  Will order strict I/Os  Daily BMPs to monitor renal function and K  Low sodium diet  2D echo pending to reassess LVF and aortic valve  2. Aortic Stenosis: mild-moderate by echo in 2013  Will assess for disease progression. Repeat 2D echo pending.  3. Stage III CKD: baseline SCr ~1.8-2.0. Admit SCr 2.07  SCr improved w/ diuresis, down to 1.8 today  Continue to monitor closely w/ diuresis   4. H/o Symptomatic Bradycardia: s/p MDT dual chamber PPM followed by Dr. Lovena [name].  Recent device interrogation 10/28/18>>>historgrams appropriate. Leads and battery stable.    5. Abnormal Hs Troponin: 45>>>52 ng/dL.   Low level. Suspect likely demand ischemia in the setting of a/c CHF and CKD  Given advanced age and CKD, no plans for ischemic evaluation   For questions or updates, please contact [place] Please consult [email or web address] for contact info under     Signed, Lyda Jester, PA-C  12/10/2018 8:39 AM

## 2018-12-10 NOTE — Progress Notes (Signed)
PROGRESS NOTE    DOMINICO ROD  IWP:[id number] DOB: 10/04/1921 DOA: 12/09/2018 PCP: Crist Infante, MD   Brief Narrative: [name] [name] is a 85 y.o. male with medical history significant of chronic diastolic CHF Aortic valve stenosis, bladder cancer, CKD, history of DVT after pacemaker insertion in his left arm, gout, hypertension, kidney stones. Patient presented secondary to worsening orthopnea with evidence of acute heart failure.   Assessment & Plan:   Active Problems:   Hypertension   Pacemaker   Aortic stenosis   Hydronephrosis, right   CKD (chronic kidney disease)   HTN (hypertension)   Acute on chronic diastolic CHF (congestive heart failure) (HCC)   CHF exacerbation (HCC)   Acute systolic heart failure New diagnosis of systolic dysfunction. Patient improved with initial IV diuresis overnight. Cardiology consulted. Transthoracic Echocardiogram with an EF of 25-30%. -Cardiology recommendations: plan to adjust PPM as a possible cause for cardiomyopathy and adjust home medications for proper medical management  History of symptomatic bradycardia Followed by Dr. Lovena Le as an outpatient. Patient is s/p pacemaker  CKD stage III Baseline  Aortic stenosis Moderate on recent Transthoracic Echocardiogram.  Essential hypertension normotensive  Elevated troponin Mild and in line with heart failure. No chest pain. Likely demand.   DVT prophylaxis: Lovenox Code Status:   Code Status: DNR Family Communication: None at bedside Disposition Plan: Discharge likely in 24 hours pending cardiology recommendations   Consultants:   Cardiology  Procedures:   9/17: Transthoracic Echocardiogram IMPRESSIONS    1. Left ventricular ejection fraction, by visual estimation, is 20 to 25%. The left ventricle has severely decreased function. Mildly increased left ventricular size. There is no left ventricular hypertrophy.  2. Multiple segmental abnormalities exist. See  findings.  3. Left ventricular diastolic Doppler parameters are consistent with impaired relaxation pattern of LV diastolic filling.  4. Global right ventricle has normal systolic function.The right ventricular size is normal. No increase in right ventricular wall thickness.  5. Left atrial size was severely dilated.  6. Right atrial size was normal.  7. Large pleural effusion in the left lateral region.  8. Small pericardial effusion.  9. The mitral valve is normal in structure. Trace mitral valve regurgitation. No evidence of mitral stenosis. 10. The tricuspid valve is normal in structure. Tricuspid valve regurgitation is mild. 11. The aortic valve is normal in structure. Aortic valve regurgitation is mild by color flow Doppler. Moderate aortic valve stenosis. 12. The pulmonic valve was normal in structure. Pulmonic valve regurgitation is trivial by color flow Doppler. 13. Mildly elevated pulmonary artery systolic pressure. 14. A pacer wire is visualized. 15. The inferior vena cava is normal in size with greater than 50% respiratory variability, suggesting right atrial pressure of 3 mmHg.  Antimicrobials:  None    Subjective: Breathing much better than prior to admission.  Objective: Vitals:   12/10/18 0042 12/10/18 0141 12/10/18 0540 12/10/18 1250  BP: (!) 151/81  140/78 129/75  Pulse: 78  75 73  Resp: (!) 22  18 17   Temp: 98 F (36.7 C)  97.6 F (36.4 C) 98.1 F (36.7 C)  TempSrc: Oral  Oral Oral  SpO2: 96%  97% 99%  Weight:  66.9 kg    Height:  5' 7.5" (1.715 m)      Intake/Output Summary (Last 24 hours) at 12/10/2018 1852 Last data filed at 12/10/2018 1300 Gross per 24 hour  Intake 360 ml  Output 1675 ml  Net -1315 ml   Filed Weights   12/09/18  2033 12/10/18 0141  Weight: 71.7 kg 66.9 kg    Examination:  General exam: Appears calm and comfortable Respiratory system: Diminished respirations Respiratory effort normal. Cardiovascular system: S1 & S2 heard, RRR.   Gastrointestinal system: Abdomen is nondistended, soft and nontender. No organomegaly or masses felt. Normal bowel sounds heard. Central nervous system: Alert and oriented. No focal neurological deficits. Extremities: Trace LE edema. No calf tenderness Skin: No cyanosis. No rashes Psychiatry: Judgement and insight appear normal. Mood & affect appropriate.     Data Reviewed: I have personally reviewed following labs and imaging studies  CBC: Recent Labs  Lab 12/09/18 2042 12/10/18 0509  WBC 7.4 6.8  HGB 12.6* 12.0*  HCT 39.7 38.2*  MCV 100.5* 99.2  PLT 234 378   Basic Metabolic Panel: Recent Labs  Lab 12/09/18 2042 12/10/18 0509  NA 142 141  K 4.3 4.2  CL 103 106  CO2 27 27  GLUCOSE 117* 82  BUN 47* 45*  CREATININE 2.07* 1.88*  CALCIUM 9.1 8.8*  MG 2.7* 2.4  PHOS  --  3.4   GFR: Estimated Creatinine Clearance: 22.2 mL/min (A) (by C-G formula based on SCr of 1.88 mg/dL (H)). Liver Function Tests: Recent Labs  Lab 12/10/18 0509  AST 20  ALT 14  ALKPHOS 40  BILITOT 0.6  PROT 7.1  ALBUMIN 3.5   No results for input(s): LIPASE, AMYLASE in the last 168 hours. No results for input(s): AMMONIA in the last 168 hours. Coagulation Profile: No results for input(s): INR, PROTIME in the last 168 hours. Cardiac Enzymes: No results for input(s): CKTOTAL, CKMB, CKMBINDEX, TROPONINI in the last 168 hours. BNP (last 3 results) No results for input(s): PROBNP in the last 8760 hours. HbA1C: No results for input(s): HGBA1C in the last 72 hours. CBG: No results for input(s): GLUCAP in the last 168 hours. Lipid Profile: No results for input(s): CHOL, HDL, LDLCALC, TRIG, CHOLHDL, LDLDIRECT in the last 72 hours. Thyroid Function Tests: Recent Labs    12/10/18 0509  TSH 5.300*   Anemia Panel: No results for input(s): VITAMINB12, FOLATE, FERRITIN, TIBC, IRON, RETICCTPCT in the last 72 hours. Sepsis Labs: No results for input(s): PROCALCITON, LATICACIDVEN in the last 168  hours.  Recent Results (from the past 240 hour(s))  SARS CORONAVIRUS 2 (TAT 6-24 HRS) Nasopharyngeal Nasopharyngeal Swab     Status: None   Collection Time: 12/09/18 10:55 PM   Specimen: Nasopharyngeal Swab  Result Value Ref Range Status   SARS Coronavirus 2 NEGATIVE NEGATIVE Final    Comment: (NOTE) SARS-CoV-2 target nucleic acids are NOT DETECTED. The SARS-CoV-2 RNA is generally detectable in upper and lower respiratory specimens during the acute phase of infection. Negative results do not preclude SARS-CoV-2 infection, do not rule out co-infections with other pathogens, and should not be used as the sole basis for treatment or other patient management decisions. Negative results must be combined with clinical observations, patient history, and epidemiological information. The expected result is Negative. Fact Sheet for Patients: SugarRoll.be Fact Sheet for Healthcare Providers: https://www.woods-mathews.com/ This test is not yet approved or cleared by the [place] FDA and  has been authorized for detection and/or diagnosis of SARS-CoV-2 by FDA under an Emergency Use Authorization (EUA). This EUA will remain  in effect (meaning this test can be used) for the duration of the COVID-19 declaration under Section 56 4(b)(1) of the Act, 21 U.S.C. section 360bbb-3(b)(1), unless the authorization is terminated or revoked sooner. Performed at [facility], [place]  [address], [place], [place] [address]          Radiology Studies: Dg Chest Portable 1 View  Result Date: 12/09/2018 CLINICAL DATA:  CHF, shortness of breath worse when laying down EXAM: PORTABLE CHEST 1 VIEW COMPARISON:  Radiograph 02/04/2014 FINDINGS: Dual lead pacer pack overlies the left chest wall with leads in the right atrium and cardiac apex. Cardiomegaly. Bilateral basilar predominant hazy interstitial opacities. Gradient density towards the bases with obscuration of  the hemidiaphragm suggest layering effusion. No pneumothorax. No acute osseous or soft tissue abnormality. Electronic device projects over the left shoulder. IMPRESSION: Findings compatible with CHF including cardiomegaly, pulmonary edema and likely layering effusions. Electronically Signed   By: Lovena Le M.D.   On: 12/09/2018 20:55        Scheduled Meds: . aspirin EC  81 mg Oral Daily  . enoxaparin (LOVENOX) injection  30 mg Subcutaneous Daily  . febuxostat  40 mg Oral Daily  . furosemide  40 mg Oral BID  . sodium chloride flush  3 mL Intravenous Q12H   Continuous Infusions: . sodium chloride       LOS: 0 days     Cordelia Poche, MD [facility] 12/10/2018, 6:52 PM  If 7PM-7AM, please contact night-coverage [email or web address]

## 2018-12-10 NOTE — Progress Notes (Signed)
  Echocardiogram 2D Echocardiogram has been performed.  [name] [name] 12/10/2018, 2:54 PM

## 2018-12-10 NOTE — Evaluation (Signed)
Occupational Therapy Evaluation Patient Details Name: MITSUGI SCHRADER MRN: [id number] DOB: 1921-05-02 Today's Date: 12/10/2018    History of Present Illness This 85 year old man was admitted for CKD/CHF exacerbation.  PMH:  HTN, Aortic Valve stenosis, gout, bladder and prostate CA, urostomy and nephrostomy, DVT, depression, macular degeneration, and pacemaker   Clinical Impression   Pt was admitted for the above.  He is mod I for adls at home and loves to walk daily. He gets meals on wheels 5x week and has family get him something on other days.  Daughter lives next door. Pt currently needs min A for adls/transfers.  Hopefully, pt will progress to home with intermittent assistance.     Follow Up Recommendations  Home health OT. Supervision for mobility/adls   Equipment Recommendations  None recommended by OT    Recommendations for Other Services       Precautions / Restrictions Precautions Precautions: Fall Precaution Comments: urostomy; nephrostomy; wears belt to hold nephrostomy; usually puts through his suspenders to keep up Restrictions Weight Bearing Restrictions: No      Mobility Bed Mobility Overal bed mobility: Needs Assistance Bed Mobility: Supine to Sit     Supine to sit: Min guard     General bed mobility comments: for safety; HOB 20  Transfers Overall transfer level: Needs assistance Equipment used: Rolling walker (2 wheeled) Transfers: Sit to/from Stand Sit to Stand: Min assist         General transfer comment: light steadying assist    Balance                                           ADL either performed or assessed with clinical judgement   ADL Overall ADL's : Needs assistance/impaired             Lower Body Bathing: Minimal assistance       Lower Body Dressing: Minimal assistance   Toilet Transfer: Minimal assistance;BSC;Stand-pivot Toilet Transfer Details (indicate cue type and reason): to chair Toileting-  Clothing Manipulation and Hygiene: Min guard         General ADL Comments: set up for UB adls.       Vision Baseline Vision/History: Macular Degeneration       Perception     Praxis      Pertinent Vitals/Pain Pain Assessment: No/denies pain     Hand Dominance     Extremity/Trunk Assessment Upper Extremity Assessment Upper Extremity Assessment: Generalized weakness           Communication Communication Communication: HOH   Cognition Arousal/Alertness: Awake/alert Behavior During Therapy: WFL for tasks assessed/performed Overall Cognitive Status: Within Functional Limits for tasks assessed                                     General Comments       Exercises     Shoulder Instructions      Home Living Family/patient expects to be discharged to:: Private residence                   Bathroom Shower/Tub: Walk-in shower   Bathroom Toilet: Handicapped height     Home Equipment: Environmental consultant - 2 wheels;Cane - single point;Shower seat   Additional Comments: usually sponge bathes      Prior Functioning/Environment Level of  Independence: Independent with assistive device(s)        Comments: likes to walk daily; daughter lives next door        OT Problem List: Decreased strength;Decreased activity tolerance;Impaired balance (sitting and/or standing)      OT Treatment/Interventions: Self-care/ADL training;Energy conservation;DME and/or AE instruction;Patient/family education;Balance training;Therapeutic activities    OT Goals(Current goals can be found in the care plan section) Acute Rehab OT Goals Patient Stated Goal: none stated; glad to be up to chair OT Goal Formulation: With patient Time For Goal Achievement: 12/24/18 Potential to Achieve Goals: Good ADL Goals Pt Will Transfer to Toilet: with supervision;ambulating(high) Pt Will Perform Toileting - Clothing Manipulation and hygiene: with supervision;sit to/from stand(including  clothing retrieval and initiate 1 rest break for energy conservation) Additional ADL Goal #1: pt will get out of flat bed at mod I level in preparation for adls  OT Frequency: Min 2X/week   Barriers to D/C:            Co-evaluation              AM-PAC OT "6 Clicks" Daily Activity     Outcome Measure Help from another person eating meals?: None Help from another person taking care of personal grooming?: A Little Help from another person toileting, which includes using toliet, bedpan, or urinal?: A Little Help from another person bathing (including washing, rinsing, drying)?: A Little Help from another person to put on and taking off regular upper body clothing?: A Little Help from another person to put on and taking off regular lower body clothing?: A Little 6 Click Score: 19   End of Session    Activity Tolerance: Patient tolerated treatment well Patient left: in chair;with call bell/phone within reach;with chair alarm set  OT Visit Diagnosis: Unsteadiness on feet (R26.81)                Time: 3846-6599 OT Time Calculation (min): 29 min Charges:  OT General Charges $OT Visit: 1 Visit OT Evaluation $OT Eval Low Complexity: 1 Low OT Treatments $Self Care/Home Management : 8-22 mins  Lesle Chris, OTR/L Acute Rehabilitation Services [phone] [facility] pager [phone] office 12/10/2018  [place] 12/10/2018, 11:54 AM

## 2018-12-11 ENCOUNTER — Encounter (HOSPITAL_COMMUNITY): Payer: Self-pay

## 2018-12-11 DIAGNOSIS — I5033 Acute on chronic diastolic (congestive) heart failure: Secondary | ICD-10-CM | POA: Diagnosis not present

## 2018-12-11 DIAGNOSIS — I5021 Acute systolic (congestive) heart failure: Secondary | ICD-10-CM | POA: Diagnosis not present

## 2018-12-11 DIAGNOSIS — I350 Nonrheumatic aortic (valve) stenosis: Secondary | ICD-10-CM | POA: Diagnosis not present

## 2018-12-11 DIAGNOSIS — I5023 Acute on chronic systolic (congestive) heart failure: Secondary | ICD-10-CM | POA: Diagnosis not present

## 2018-12-11 DIAGNOSIS — N183 Chronic kidney disease, stage 3 (moderate): Secondary | ICD-10-CM | POA: Diagnosis not present

## 2018-12-11 DIAGNOSIS — I10 Essential (primary) hypertension: Secondary | ICD-10-CM | POA: Diagnosis not present

## 2018-12-11 LAB — BASIC METABOLIC PANEL
Anion gap: 12 (ref 5–15)
Anion gap: 12 (ref 5–15)
BUN: 58 mg/dL — ABNORMAL HIGH (ref 8–23)
BUN: 61 mg/dL — ABNORMAL HIGH (ref 8–23)
CO2: 27 mmol/L (ref 22–32)
CO2: 29 mmol/L (ref 22–32)
Calcium: 8.9 mg/dL (ref 8.9–10.3)
Calcium: 9.1 mg/dL (ref 8.9–10.3)
Chloride: 102 mmol/L (ref 98–111)
Chloride: 100 mmol/L (ref 98–111)
Creatinine, Ser: 2.53 mg/dL — ABNORMAL HIGH (ref 0.61–1.24)
Creatinine, Ser: 2.81 mg/dL — ABNORMAL HIGH (ref 0.61–1.24)
GFR calc Af Amer: 24 mL/min — ABNORMAL LOW (ref >60)
GFR calc Af Amer: 21 mL/min — ABNORMAL LOW (ref >60)
GFR calc non Af Amer: 21 mL/min — ABNORMAL LOW (ref >60)
GFR calc non Af Amer: 18 mL/min — ABNORMAL LOW (ref >60)
Glucose, Bld: 97 mg/dL (ref 70–99)
Glucose, Bld: 121 mg/dL — ABNORMAL HIGH (ref 70–99)
Potassium: 4.8 mmol/L (ref 3.5–5.1)
Potassium: 5.1 mmol/L (ref 3.5–5.1)
Sodium: 141 mmol/L (ref 135–145)
Sodium: 141 mmol/L (ref 135–145)

## 2018-12-11 LAB — T3, FREE: T3, Free: 1.9 pg/mL — ABNORMAL LOW (ref 2.0–4.4)

## 2018-12-11 MED ORDER — METOPROLOL SUCCINATE ER 25 MG PO TB24
25.0000 mg | ORAL_TABLET | Freq: Every day | ORAL | Status: DC
  Administered 2018-12-11 – 2018-12-12 (×2): 25 mg via ORAL
  Filled 2018-12-11 (×2): qty 1

## 2018-12-11 NOTE — Progress Notes (Signed)
PROGRESS NOTE    [name] [name]  DQQ:[id number] DOB: 1922-02-11 DOA: 12/09/2018 PCP: Crist Infante, MD   Brief Narrative: [name] [name] is a 85 y.o. male with medical history significant of chronic diastolic CHF Aortic valve stenosis, bladder cancer, CKD, history of DVT after pacemaker insertion in his left arm, gout, hypertension, kidney stones. Patient presented secondary to worsening orthopnea with evidence of acute heart failure.   Assessment & Plan:   Active Problems:   Hypertension   Pacemaker   Aortic stenosis   Hydronephrosis, right   CKD (chronic kidney disease)   HTN (hypertension)   Acute on chronic diastolic CHF (congestive heart failure) (HCC)   CHF exacerbation (HCC)   Acute systolic heart failure New diagnosis of systolic dysfunction. Patient improved with initial IV diuresis overnight. Cardiology consulted. Transthoracic Echocardiogram with an EF of 25-30%. Possibly secondary to AS -Cardiology recommendations: Lasix, metoprolol, outpatient follow-up  History of symptomatic bradycardia Followed by Dr. Lovena Le as an outpatient. Patient is s/p pacemaker  AKI on CKD stage III Secondary to diuresis -Hold Lasix  Aortic stenosis Moderate on recent Transthoracic Echocardiogram.  Essential hypertension normotensive  Elevated troponin Mild and in line with heart failure. No chest pain. Likely demand.   DVT prophylaxis: Lovenox Code Status:   Code Status: DNR Family Communication: None at bedside Disposition Plan: Discharge pending improvement of AKI   Consultants:   Cardiology  Procedures:   9/17: Transthoracic Echocardiogram IMPRESSIONS    1. Left ventricular ejection fraction, by visual estimation, is 20 to 25%. The left ventricle has severely decreased function. Mildly increased left ventricular size. There is no left ventricular hypertrophy.  2. Multiple segmental abnormalities exist. See findings.  3. Left ventricular diastolic  Doppler parameters are consistent with impaired relaxation pattern of LV diastolic filling.  4. Global right ventricle has normal systolic function.The right ventricular size is normal. No increase in right ventricular wall thickness.  5. Left atrial size was severely dilated.  6. Right atrial size was normal.  7. Large pleural effusion in the left lateral region.  8. Small pericardial effusion.  9. The mitral valve is normal in structure. Trace mitral valve regurgitation. No evidence of mitral stenosis. 10. The tricuspid valve is normal in structure. Tricuspid valve regurgitation is mild. 11. The aortic valve is normal in structure. Aortic valve regurgitation is mild by color flow Doppler. Moderate aortic valve stenosis. 12. The pulmonic valve was normal in structure. Pulmonic valve regurgitation is trivial by color flow Doppler. 13. Mildly elevated pulmonary artery systolic pressure. 14. A pacer wire is visualized. 15. The inferior vena cava is normal in size with greater than 50% respiratory variability, suggesting right atrial pressure of 3 mmHg.  Antimicrobials:  None    Subjective: No concerns this morning.  Objective: Vitals:   12/10/18 1250 12/10/18 2055 12/11/18 0505 12/11/18 1317  BP: 129/75 107/64 137/72 111/64  Pulse: 73 73 73 60  Resp: 17 16 16 17   Temp: 98.1 F (36.7 C) 97.7 F (36.5 C) 97.7 F (36.5 C) (!) 97.5 F (36.4 C)  TempSrc: Oral Oral Oral Oral  SpO2: 99% 96% 96% 99%  Weight:   65.5 kg   Height:        Intake/Output Summary (Last 24 hours) at 12/11/2018 1334 Last data filed at 12/11/2018 1000 Gross per 24 hour  Intake 303 ml  Output 2270 ml  Net -1967 ml   Filed Weights   12/09/18 2033 12/10/18 0141 12/11/18 0505  Weight: 71.7 kg 66.9 kg  65.5 kg    Examination:  General exam: Appears calm and comfortable Respiratory system: Clear to auscultation. Respiratory effort normal. Cardiovascular system: S1 & S2 heard, RRR. No murmurs, rubs, gallops  or clicks. Gastrointestinal system: Abdomen is nondistended, soft and nontender. No organomegaly or masses felt. Normal bowel sounds heard. Central nervous system: Alert and oriented. No focal neurological deficits. Extremities: No edema. No calf tenderness Skin: No cyanosis. No rashes Psychiatry: Judgement and insight appear normal. Mood & affect appropriate.     Data Reviewed: I have personally reviewed following labs and imaging studies  CBC: Recent Labs  Lab 12/09/18 2042 12/10/18 0509  WBC 7.4 6.8  HGB 12.6* 12.0*  HCT 39.7 38.2*  MCV 100.5* 99.2  PLT 234 818   Basic Metabolic Panel: Recent Labs  Lab 12/09/18 2042 12/10/18 0509 12/11/18 0819 12/11/18 1201  NA 142 141 141 141  K 4.3 4.2 4.8 5.1  CL 103 106 102 100  CO2 27 27 27 29   GLUCOSE 117* 82 97 121*  BUN 47* 45* 58* 61*  CREATININE 2.07* 1.88* 2.53* 2.81*  CALCIUM 9.1 8.8* 8.9 9.1  MG 2.7* 2.4  --   --   PHOS  --  3.4  --   --    GFR: Estimated Creatinine Clearance: 14.6 mL/min (A) (by C-G formula based on SCr of 2.81 mg/dL (H)). Liver Function Tests: Recent Labs  Lab 12/10/18 0509  AST 20  ALT 14  ALKPHOS 40  BILITOT 0.6  PROT 7.1  ALBUMIN 3.5   No results for input(s): LIPASE, AMYLASE in the last 168 hours. No results for input(s): AMMONIA in the last 168 hours. Coagulation Profile: No results for input(s): INR, PROTIME in the last 168 hours. Cardiac Enzymes: No results for input(s): CKTOTAL, CKMB, CKMBINDEX, TROPONINI in the last 168 hours. BNP (last 3 results) No results for input(s): PROBNP in the last 8760 hours. HbA1C: No results for input(s): HGBA1C in the last 72 hours. CBG: No results for input(s): GLUCAP in the last 168 hours. Lipid Profile: No results for input(s): CHOL, HDL, LDLCALC, TRIG, CHOLHDL, LDLDIRECT in the last 72 hours. Thyroid Function Tests: Recent Labs    12/10/18 0509 12/10/18 1714 12/10/18 1715  TSH 5.300*  --   --   FREET4  --  0.98  --   T3FREE  --   --   1.9*   Anemia Panel: No results for input(s): VITAMINB12, FOLATE, FERRITIN, TIBC, IRON, RETICCTPCT in the last 72 hours. Sepsis Labs: No results for input(s): PROCALCITON, LATICACIDVEN in the last 168 hours.  Recent Results (from the past 240 hour(s))  SARS CORONAVIRUS 2 (TAT 6-24 HRS) Nasopharyngeal Nasopharyngeal Swab     Status: None   Collection Time: 12/09/18 10:55 PM   Specimen: Nasopharyngeal Swab  Result Value Ref Range Status   SARS Coronavirus 2 NEGATIVE NEGATIVE Final    Comment: (NOTE) SARS-CoV-2 target nucleic acids are NOT DETECTED. The SARS-CoV-2 RNA is generally detectable in upper and lower respiratory specimens during the acute phase of infection. Negative results do not preclude SARS-CoV-2 infection, do not rule out co-infections with other pathogens, and should not be used as the sole basis for treatment or other patient management decisions. Negative results must be combined with clinical observations, patient history, and epidemiological information. The expected result is Negative. Fact Sheet for Patients: SugarRoll.be Fact Sheet for Healthcare Providers: https://www.woods-mathews.com/ This test is not yet approved or cleared by the [place] FDA and  has been authorized for detection and/or  diagnosis of SARS-CoV-2 by FDA under an Emergency Use Authorization (EUA). This EUA will remain  in effect (meaning this test can be used) for the duration of the COVID-19 declaration under Section 56 4(b)(1) of the Act, 21 U.S.C. section 360bbb-3(b)(1), unless the authorization is terminated or revoked sooner. Performed at [facility], [place] [address] [place], [place] [address]          Radiology Studies: Dg Chest Portable 1 View  Result Date: 12/09/2018 CLINICAL DATA:  CHF, shortness of breath worse when laying down EXAM: PORTABLE CHEST 1 VIEW COMPARISON:  Radiograph 02/04/2014 FINDINGS: Dual lead pacer  pack overlies the left chest wall with leads in the right atrium and cardiac apex. Cardiomegaly. Bilateral basilar predominant hazy interstitial opacities. Gradient density towards the bases with obscuration of the hemidiaphragm suggest layering effusion. No pneumothorax. No acute osseous or soft tissue abnormality. Electronic device projects over the left shoulder. IMPRESSION: Findings compatible with CHF including cardiomegaly, pulmonary edema and likely layering effusions. Electronically Signed   By: Lovena Le M.D.   On: 12/09/2018 20:55        Scheduled Meds: . aspirin EC  81 mg Oral Daily  . enoxaparin (LOVENOX) injection  30 mg Subcutaneous Daily  . febuxostat  40 mg Oral Daily  . metoprolol succinate  25 mg Oral Daily  . sodium chloride flush  3 mL Intravenous Q12H   Continuous Infusions: . sodium chloride       LOS: 0 days     Cordelia Poche, MD [facility] 12/11/2018, 1:34 PM  If 7PM-7AM, please contact night-coverage [email or web address]

## 2018-12-11 NOTE — Discharge Summary (Signed)
Physician Discharge Summary  [name] [name] CXK:[id number] DOB: [birth year] DOA: 12/09/2018  PCP: Crist Infante, MD  Admit date: 12/09/2018 Discharge date: 12/11/2018  Admitted From: Home Disposition: Home  Recommendations for Outpatient Follow-up:  1. Follow up with PCP in 1 week 2. Follow up with cardiology 3. Please obtain BMP/CBC in 3 days 4. Please follow up on the following pending results: None  Home Health: PT Equipment/Devices: Rolling walker  Discharge Condition: Stable CODE STATUS: DNR Diet recommendation: Heart healthy   Brief/Interim Summary:  Admission HPI written by Toy Baker, MD   HPI: [name] [name] is a 85 y.o. 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 Initially his leg edema was felt to be secondary to Norvasc he was taken off for that and started on Lasix by his primary care provider it felt like he had some improvement but continued to be short of breath no chest pain nonfebrile he has some lower extremity edema. He gets meals on wheals low potasium diet  Seen by his primary care provider and was told to come to emergency department and have a full work-up in which worked up for CHF.    Hospital course:  Acute systolic heart failure New diagnosis of systolic dysfunction. Patient improved with initial IV diuresis overnight. Cardiology consulted. Transthoracic Echocardiogram with an EF of 25-30%.Patient stated on metoprolol and lasix inpatient with plans for starting outpatient ACEi/ARB. Will need continued outpatient cardiology follow-up. Lasix held prior to discharge secondary to AKI  History of symptomatic  bradycardia Followed by Dr. Lovena Le as an outpatient. Patient is s/p pacemaker  AKI on CKD stage III Worsened in setting of lasix diuresis. Reached plateau of 2.98 with slight improvement prior to discharge. Discussed with daughter and request made to manage outpatient. Patient with good urine output. Recommend close follow-up outpatient.  Aortic stenosis Moderate on recent Transthoracic Echocardiogram.  Essential hypertension normotensive  Elevated troponin Mild and in line with heart failure. No chest pain. Likely demand.  Pleural effusion Secondary to heart failure. Asymptomatic at this time so no thoracentesis. Outpatient follow-up/surveillance   Discharge Diagnoses:  Active Problems:   Hypertension   Pacemaker   Aortic stenosis   Hydronephrosis, right   CKD (chronic kidney disease)   HTN (hypertension)   Acute on chronic diastolic CHF (congestive heart failure) ([place])   CHF exacerbation ([place])    Discharge Instructions   Allergies as of 12/12/2018   No Known Allergies     Medication List    TAKE these medications   acetaminophen 500 MG tablet Commonly known as: TYLENOL Take 1,000 mg by mouth every 8 (eight) hours as needed for mild pain or headache.   aspirin EC 81 MG tablet Take 81 mg by mouth every morning.   Besivance 0.6 % Susp Generic drug: Besifloxacin HCl Apply 1 drop to eye See admin instructions. Only uses before eye shot he receives every 10 weeks.   CLEAR EYES COMPLETE OP Apply 1-2 drops to eye daily as needed (dry eyes).   colchicine 0.6 MG tablet Take 0.6 mg by mouth as needed (gout).   docusate sodium 100 MG capsule Commonly known as: COLACE Take 300 mg by mouth daily.  furosemide 20 MG tablet Commonly known as: LASIX Hold lasix until following up with your cardiologist What changed:   how much to take  how to take this  when to take this  additional instructions   metoprolol succinate 25 MG 24 hr tablet Commonly known  as: TOPROL-XL Take 1 tablet (25 mg total) by mouth daily. Start taking on: December 13, 2018   OMEGA-3 KRILL OIL PO Take 1 tablet by mouth every morning.   polyethylene glycol powder 17 GM/SCOOP powder Commonly known as: GLYCOLAX/MIRALAX Take 17 g by mouth daily as needed for mild constipation. Takes 17grams (1 capful) as needed. What changed: how much to take   Uloric 40 MG tablet Generic drug: febuxostat Take 1 tablet by mouth daily.   Vitamin D-3 25 MCG (1000 UT) Caps Take 1 capsule by mouth every morning.   VITEYES AREDS ADVANCED PO Take 2 capsules by mouth daily.       No Known Allergies  Consultations:  Cardiology   Procedures/Studies: Dg Chest Portable 1 View  Result Date: 12/09/2018 CLINICAL DATA:  CHF, shortness of breath worse when laying down EXAM: PORTABLE CHEST 1 VIEW COMPARISON:  Radiograph 02/04/2014 FINDINGS: Dual lead pacer pack overlies the left chest wall with leads in the right atrium and cardiac apex. Cardiomegaly. Bilateral basilar predominant hazy interstitial opacities. Gradient density towards the bases with obscuration of the hemidiaphragm suggest layering effusion. No pneumothorax. No acute osseous or soft tissue abnormality. Electronic device projects over the left shoulder. IMPRESSION: Findings compatible with CHF including cardiomegaly, pulmonary edema and likely layering effusions. Electronically Signed   By: Lovena Le M.D.   On: 12/09/2018 20:55   Ir Nephrostomy Exchange Right  Result Date: 11/19/2018 INDICATION: Chronic indwelling right-sided nephrostomy tube requiring routine exchange. EXAM: RIGHT PERCUTANEOUS NEPHROSTOMY TUBE EXCHANGE UNDER FLUOROSCOPY COMPARISON:  None. MEDICATIONS: None ANESTHESIA/SEDATION: None CONTRAST:  10 mL Omnipaque 300-administered into the collecting system(s) FLUOROSCOPY TIME:  Fluoroscopy Time: 42 seconds.  10.3 mGy. COMPLICATIONS: None immediate. PROCEDURE: Informed written consent was obtained from the  patient after a thorough discussion of the procedural risks, benefits and alternatives. All questions were addressed. Maximal Sterile Barrier Technique was utilized including caps, mask, sterile gowns, sterile gloves, sterile drape, hand hygiene and skin antiseptic. A timeout was performed prior to the initiation of the procedure. The pre-existing nephrostomy tube was injected with contrast material, cut and removed over a guidewire. A new 10 French Dawson Mueller pigtail drainage catheter was advanced over a guidewire and formed in the renal pelvis. The catheter was injected with contrast to confirm position and a fluoroscopic spot image obtained. The catheter was secured with a StatLock device and connected to a gravity drainage bag. FINDINGS: The new nephrostomy tube was formed in the renal pelvis. IMPRESSION: Routine exchange of chronic indwelling right percutaneous nephrostomy tube. Electronically Signed   By: Aletta Edouard M.D.   On: 11/19/2018 15:28     9/17: Transthoracic Echocardiogram IMPRESSIONS    1. Left ventricular ejection fraction, by visual estimation, is 20 to 25%. The left ventricle has severely decreased function. Mildly increased left ventricular size. There is no left ventricular hypertrophy.  2. Multiple segmental abnormalities exist. See findings.  3. Left ventricular diastolic Doppler parameters are consistent with impaired relaxation pattern of LV diastolic filling.  4. Global right ventricle has normal systolic function.The right ventricular size is normal. No increase in right ventricular wall thickness.  5. Left atrial size was severely dilated.  6. Right atrial size was normal.  7. Large pleural effusion in the left lateral region.  8. Small pericardial effusion.  9. The mitral valve is normal in structure. Trace mitral valve regurgitation. No evidence of mitral stenosis. 10. The tricuspid valve is normal in structure. Tricuspid valve regurgitation is mild. 11. The  aortic valve is normal in structure. Aortic valve regurgitation is mild by color flow Doppler. Moderate aortic valve stenosis. 12. The pulmonic valve was normal in structure. Pulmonic valve regurgitation is trivial by color flow Doppler. 13. Mildly elevated pulmonary artery systolic pressure. 14. A pacer wire is visualized. 15. The inferior vena cava is normal in size with greater than 50% respiratory variability, suggesting right atrial pressure of 3 mmHg.  FINDINGS  Left Ventricle: Left ventricular ejection fraction, by visual estimation, is 20 to 25%. The left ventricle has severely decreased function. There is no left ventricular hypertrophy. Mildly increased left ventricular size. Spectral Doppler shows Left  ventricular diastolic Doppler parameters are consistent with impaired relaxation pattern of LV diastolic filling. Global hypokinesis.    LV Wall Scoring: The entire inferior septum is akinetic. The entire anterior wall, entire inferior wall, basal and mid anterior septum, basal and mid inferolateral wall, and apex are hypokinetic.  Right Ventricle: The right ventricular size is normal. No increase in right ventricular wall thickness. Global RV systolic function is has normal systolic function. The tricuspid regurgitant velocity is 2.51 m/s, and with an assumed right atrial pressure  of 8 mmHg, the estimated right ventricular systolic pressure is mildly elevated at 33.2 mmHg.  Left Atrium: Left atrial size was severely dilated.  Right Atrium: Right atrial size was normal in size  Pericardium: A small pericardial effusion is present. There is a large pleural effusion in the left lateral region.  Mitral Valve: The mitral valve is normal in structure. No evidence of mitral valve stenosis by observation. Trace mitral valve regurgitation.  Tricuspid Valve: The tricuspid valve is normal in structure. Tricuspid valve regurgitation is mild by color flow Doppler.  Aortic Valve:  The aortic valve is normal in structure. Aortic valve regurgitation is mild by color flow Doppler. Aortic regurgitation PHT measures 797 msec. Moderate aortic stenosis is present. Aortic valve mean gradient measures 29.3 mmHg. Aortic valve  peak gradient measures 49.3 mmHg. Aortic valve area, by VTI measures 0.75 cm. Severely calcified and thickened aortic valve leaflets. Cannot rule out low flow, low gradient severe aortic stenosis.  Pulmonic Valve: The pulmonic valve was normal in structure. Pulmonic valve regurgitation is trivial by color flow Doppler.  Aorta: The aortic root, ascending aorta and aortic arch are all structurally normal, with no evidence of dilitation or obstruction.  Venous: The inferior vena cava was not well visualized. The inferior vena cava is normal in size with greater than 50% respiratory variability, suggesting right atrial pressure of 3 mmHg.  Shunts: No ventricular septal defect is seen or detected. There is no evidence of an atrial septal defect. No atrial level shunt detected by color flow Doppler.  Additional Comments: A pacer wire is visualized.   Subjective: No issues today. Breathing well.  Discharge Exam: Vitals:   12/10/18 2055 12/11/18 0505  BP: 107/64 137/72  Pulse: 73 73  Resp: 16 16  Temp: 97.7 F (36.5 C) 97.7 F (36.5 C)  SpO2: 96% 96%   Vitals:   12/10/18 0540 12/10/18 1250 12/10/18 2055 12/11/18 0505  BP: 140/78 129/75 107/64 137/72  Pulse: 75 73 73 73  Resp: 18 17 16 16   Temp: 97.6 F (36.4 C)  98.1 F (36.7 C) 97.7 F (36.5 C) 97.7 F (36.5 C)  TempSrc: Oral Oral Oral Oral  SpO2: 97% 99% 96% 96%  Weight:    65.5 kg  Height:        General exam: Appears calm and comfortable Respiratory system: Clear to auscultation with diminished breath sounds on right compared to left. Respiratory effort normal. Cardiovascular system: S1 & S2 heard, RRR. 2/6 systolic murmur. Gastrointestinal system: Abdomen is mildly distended, soft  and nontender. No organomegaly or masses felt. Normal bowel sounds heard. Central nervous system: Alert and oriented. No focal neurological deficits. Extremities: No edema. No calf tenderness Skin: No cyanosis. No rashes Psychiatry: Judgement and insight appear normal. Mood & affect appropriate   The results of significant diagnostics from this hospitalization (including imaging, microbiology, ancillary and laboratory) are listed below for reference.     Microbiology: Recent Results (from the past 240 hour(s))  SARS CORONAVIRUS 2 (TAT 6-24 HRS) Nasopharyngeal Nasopharyngeal Swab     Status: None   Collection Time: 12/09/18 10:55 PM   Specimen: Nasopharyngeal Swab  Result Value Ref Range Status   SARS Coronavirus 2 NEGATIVE NEGATIVE Final    Comment: (NOTE) SARS-CoV-2 target nucleic acids are NOT DETECTED. The SARS-CoV-2 RNA is generally detectable in upper and lower respiratory specimens during the acute phase of infection. Negative results do not preclude SARS-CoV-2 infection, do not rule out co-infections with other pathogens, and should not be used as the sole basis for treatment or other patient management decisions. Negative results must be combined with clinical observations, patient history, and epidemiological information. The expected result is Negative. Fact Sheet for Patients: SugarRoll.be Fact Sheet for Healthcare Providers: https://www.woods-mathews.com/ This test is not yet approved or cleared by the [place] FDA and  has been authorized for detection and/or diagnosis of SARS-CoV-2 by FDA under an Emergency Use Authorization (EUA). This EUA will remain  in effect (meaning this test can be used) for the duration of the COVID-19 declaration under Section 56 4(b)(1) of the Act, 21 U.S.C. section 360bbb-3(b)(1), unless the authorization is terminated or revoked sooner. Performed at [facility], [place] [address]  [place],  [address]      Labs: BNP (last 3 results) Recent Labs    12/09/18 2042  BNP 4,656.8*   Basic Metabolic Panel: Recent Labs  Lab 12/09/18 2042 12/10/18 0509 12/11/18 0819  NA 142 141 141  K 4.3 4.2 4.8  CL 103 106 102  CO2 27 27 27   GLUCOSE 117* 82 97  BUN 47* 45* 58*  CREATININE 2.07* 1.88* 2.53*  CALCIUM 9.1 8.8* 8.9  MG 2.7* 2.4  --   PHOS  --  3.4  --    Liver Function Tests: Recent Labs  Lab 12/10/18 0509  AST 20  ALT 14  ALKPHOS 40  BILITOT 0.6  PROT 7.1  ALBUMIN 3.5   No results for input(s): LIPASE, AMYLASE in the last 168 hours. No results for input(s): AMMONIA in the last 168 hours. CBC: Recent Labs  Lab 12/09/18 2042 12/10/18 0509  WBC 7.4 6.8  HGB 12.6* 12.0*  HCT 39.7 38.2*  MCV 100.5* 99.2  PLT 234 228   Cardiac Enzymes: No results for input(s): CKTOTAL, CKMB, CKMBINDEX, TROPONINI in the last 168 hours. BNP: Invalid input(s): POCBNP CBG: No results for input(s): GLUCAP in the last 168 hours. D-Dimer No results for input(s): DDIMER in the last 72 hours. Hgb A1c No results for input(s): HGBA1C in the last 72 hours.  Lipid Profile No results for input(s): CHOL, HDL, LDLCALC, TRIG, CHOLHDL, LDLDIRECT in the last 72 hours. Thyroid function studies Recent Labs    12/10/18 0509 12/10/18 1715  TSH 5.300*  --   T3FREE  --  1.9*   Anemia work up No results for input(s): VITAMINB12, FOLATE, FERRITIN, TIBC, IRON, RETICCTPCT in the last 72 hours. Urinalysis    Component Value Date/Time   COLORURINE YELLOW 12/09/2018 2305   APPEARANCEUR HAZY (A) 12/09/2018 2305   LABSPEC 1.013 12/09/2018 2305   PHURINE 7.0 12/09/2018 2305   GLUCOSEU NEGATIVE 12/09/2018 2305   HGBUR NEGATIVE 12/09/2018 2305   BILIRUBINUR NEGATIVE 12/09/2018 2305   KETONESUR NEGATIVE 12/09/2018 2305   PROTEINUR 30 (A) 12/09/2018 2305   UROBILINOGEN 0.2 02/04/2014 2157   NITRITE NEGATIVE 12/09/2018 2305   LEUKOCYTESUR LARGE (A) 12/09/2018 2305   Sepsis  Labs Invalid input(s): PROCALCITONIN,  WBC,  LACTICIDVEN Microbiology Recent Results (from the past 240 hour(s))  SARS CORONAVIRUS 2 (TAT 6-24 HRS) Nasopharyngeal Nasopharyngeal Swab     Status: None   Collection Time: 12/09/18 10:55 PM   Specimen: Nasopharyngeal Swab  Result Value Ref Range Status   SARS Coronavirus 2 NEGATIVE NEGATIVE Final    Comment: (NOTE) SARS-CoV-2 target nucleic acids are NOT DETECTED. The SARS-CoV-2 RNA is generally detectable in upper and lower respiratory specimens during the acute phase of infection. Negative results do not preclude SARS-CoV-2 infection, do not rule out co-infections with other pathogens, and should not be used as the sole basis for treatment or other patient management decisions. Negative results must be combined with clinical observations, patient history, and epidemiological information. The expected result is Negative. Fact Sheet for Patients: SugarRoll.be Fact Sheet for Healthcare Providers: https://www.woods-mathews.com/ This test is not yet approved or cleared by the [place] FDA and  has been authorized for detection and/or diagnosis of SARS-CoV-2 by FDA under an Emergency Use Authorization (EUA). This EUA will remain  in effect (meaning this test can be used) for the duration of the COVID-19 declaration under Section 56 4(b)(1) of the Act, 21 U.S.C. section 360bbb-3(b)(1), unless the authorization is terminated or revoked sooner. Performed at [facility], [place] [address] [place], [place] [address]      SIGNED:   Cordelia Poche, MD [facility] 12/11/2018, 11:08 AM

## 2018-12-11 NOTE — Progress Notes (Signed)
Progress Note  Patient Name: [name] [name] Date of Encounter: 12/11/2018  Primary Cardiologist: Cristopher Peru, MD   Subjective   Feels much better. No dyspnea. Slept comfortably last night. No supplemental O2 requirements. Denies CP.   Inpatient Medications    Scheduled Meds: . aspirin EC  81 mg Oral Daily  . enoxaparin (LOVENOX) injection  30 mg Subcutaneous Daily  . febuxostat  40 mg Oral Daily  . furosemide  40 mg Oral BID  . metoprolol succinate  25 mg Oral Daily  . sodium chloride flush  3 mL Intravenous Q12H   Continuous Infusions: . sodium chloride     PRN Meds: sodium chloride, acetaminophen **OR** acetaminophen, HYDROcodone-acetaminophen, ondansetron **OR** ondansetron (ZOFRAN) IV, sodium chloride flush   Vital Signs    Vitals:   12/10/18 0540 12/10/18 1250 12/10/18 2055 12/11/18 0505  BP: 140/78 129/75 107/64 137/72  Pulse: 75 73 73 73  Resp: 18 17 16 16   Temp: 97.6 F (36.4 C) 98.1 F (36.7 C) 97.7 F (36.5 C) 97.7 F (36.5 C)  TempSrc: Oral Oral Oral Oral  SpO2: 97% 99% 96% 96%  Weight:    65.5 kg  Height:        Intake/Output Summary (Last 24 hours) at 12/11/2018 0740 Last data filed at 12/11/2018 0513 Gross per 24 hour  Intake 360 ml  Output 1900 ml  Net -1540 ml   Last 3 Weights 12/11/2018 12/10/2018 12/09/2018  Weight (lbs) 144 lb 6.4 oz 147 lb 7.8 oz 158 lb  Weight (kg) 65.499 kg 66.9 kg 71.668 kg      Telemetry    Paced 78 bpm  - Personally Reviewed  ECG    No new EKGs to review today - Personally Reviewed  Physical Exam   GEN: No acute distress.   Neck: No JVD Cardiac: RRR, 2/6 AS murmur w/ radiation to the carotids Respiratory: Clear to auscultation bilaterally. GI: Soft, nontender, non-distended  MS: No edema; No deformity. Neuro:  Nonfocal  Psych: Normal affect   Labs    High Sensitivity Troponin:   Recent Labs  Lab 12/09/18 2042 12/09/18 2255  TROPONINIHS 45* 52*      Chemistry Recent Labs  Lab 12/09/18  2042 12/10/18 0509  NA 142 141  K 4.3 4.2  CL 103 106  CO2 27 27  GLUCOSE 117* 82  BUN 47* 45*  CREATININE 2.07* 1.88*  CALCIUM 9.1 8.8*  PROT  --  7.1  ALBUMIN  --  3.5  AST  --  20  ALT  --  14  ALKPHOS  --  40  BILITOT  --  0.6  GFRNONAA 26* 30*  GFRAA 31* 34*  ANIONGAP 12 8     Hematology Recent Labs  Lab 12/09/18 2042 12/10/18 0509  WBC 7.4 6.8  RBC 3.95* 3.85*  HGB 12.6* 12.0*  HCT 39.7 38.2*  MCV 100.5* 99.2  MCH 31.9 31.2  MCHC 31.7 31.4  RDW 14.1 13.8  PLT 234 228    BNP Recent Labs  Lab 12/09/18 2042  BNP 3,559.2*     DDimer No results for input(s): DDIMER in the last 168 hours.   Radiology    Dg Chest Portable 1 View  Result Date: 12/09/2018 CLINICAL DATA:  CHF, shortness of breath worse when laying down EXAM: PORTABLE CHEST 1 VIEW COMPARISON:  Radiograph 02/04/2014 FINDINGS: Dual lead pacer pack overlies the left chest wall with leads in the right atrium and cardiac apex. Cardiomegaly. Bilateral basilar predominant hazy  interstitial opacities. Gradient density towards the bases with obscuration of the hemidiaphragm suggest layering effusion. No pneumothorax. No acute osseous or soft tissue abnormality. Electronic device projects over the left shoulder. IMPRESSION: Findings compatible with CHF including cardiomegaly, pulmonary edema and likely layering effusions. Electronically Signed   By: Lovena Le M.D.   On: 12/09/2018 20:55    Cardiac Studies   2D Echo 12/10/18 1. Left ventricular ejection fraction, by visual estimation, is 20 to 25%. The left ventricle has severely decreased function. Mildly increased left ventricular size. There is no left ventricular hypertrophy. 2. Multiple segmental abnormalities exist. See findings. 3. Left ventricular diastolic Doppler parameters are consistent with impaired relaxation pattern of LV diastolic filling. 4. Global right ventricle has normal systolic function.The right ventricular size is normal. No  increase in right ventricular wall thickness. 5. Left atrial size was severely dilated. 6. Right atrial size was normal. 7. Large pleural effusion in the left lateral region. 8. Small pericardial effusion. 9. The mitral valve is normal in structure. Trace mitral valve regurgitation. No evidence of mitral stenosis. 10. The tricuspid valve is normal in structure. Tricuspid valve regurgitation is mild. 11. The aortic valve is normal in structure. Aortic valve regurgitation is mild by color flow Doppler. Moderate aortic valve stenosis. 12. The pulmonic valve was normal in structure. Pulmonic valve regurgitation is trivial by color flow Doppler. 13. Mildly elevated pulmonary artery systolic pressure. 14. A pacer wire is visualized. 15. The inferior vena cava is normal in size with greater than 50% respiratory variability, suggesting right atrial pressure of 3 mmHg.    LV Wall Scoring: The entire inferior septum is akinetic. The entire anterior wall, entire inferior wall, basal and mid anterior septum, basal and mid inferolateral wall, and apex are hypokinetic.  Patient Profile     [name] [name] is a 85 y.o. male with a hx of sinus bradycardia s/p MDT dual chamber PPM c/b UE DVT following insertion, mild-moderate AS noted on echo in 2979, chronic diastolic HF, HTN, CKD, h/o prostate and bladder CA, h/o kidney stones, gout and is legally blind, admitted for acute CHF. Echocardiogram reveals new severe LV systolic function and severe aortic stenosis.   Assessment & Plan    1. Acute Combined Systolic and Diastolic CHF:  Good response to IV diuretics w/ significant symptomatic improvement  -2.4L out yesterday in UOP.   Wt down 3 lb in past 24 hrs from 147>>144 lb  No further dyspnea. No peripheral edema and lungs are CTAB  Continue PO Lasix, 40 mg BID  F/u BMP pending. Will check SCr and K.   Will need to adopt a low sodium diet once discharged and check wt daily  2.  Cardiomyopathy: new diagnosis. Echo with severely reduced LVEF, 20-25%, previously normal in 2013. Differential diagnosis includes dilated CM due to severe AS, ischemic CM (WMAs on echo) vs pacing induced cardiomyopathy (AP-VP percentage at 88% on recent interrogation).   Will arrange for TAVR w/u>>will need R/LHC or coronary CTA as part of w/u. This will rule in/out CAD. To be done as outpatient.   Will reprogram his PPM to adjust pacing rate to 60.   Will begin medical therapy for systolic HF  Start  blocker therapy, Toprol XL 25 mg, today.  Monitor BP to avoid hypotension given severe AS.   If able to tolerate, will consider later addition of an ACE/ARB. This can be done as outpatient.    3. Severe Aortic Stenosis: Disease progression since 2013. Previously  mild-moderate. Echo done 12/10/18 reported aortic valve mean gradient measures 29.3 mmHg. Aortic valve peak gradient measures 49.3 mmHg. Aortic valve area, by VTI measures 0.75 cm. Severely calcified and thickened aortic valve leaflets. Cannot rule out low flow, low gradient severe aortic stenosis. Study personally reviewed by Dr. Audie Box and AS appears to be severe.  Will arrange for outpatient [facility] consultation for potential TAVR  4. Stage III CKD: baseline SCr ~1.8-2.0. Admit SCr 2.07  Monitor while diuresing   F/u BMP pending.   5. H/o Symptomatic Bradycardia: s/p MDT dual chamber PPM followed by Dr. Lovena Le.  Recent device interrogation revealed AP-VP percentage at 88%. ? If contributing to his new cardiomyopathy.  MDT device rep contacted to reprogram his pacing rate to 60 bpm prior to d/c today.  6. Abnormal Hs Troponin: 45>>>52 ng/dL.   Low level. Suspect likely demand ischemia in the setting of a/c CHF and CKD  However, possibility of underlying coronary artery disease given severe LV dysfunction and WMAs. He will need ultimate ischemic eval if TAVR w/u is pursued.     For questions or updates, please  contact [place] Please consult [email or web address] for contact info under        Signed, Lyda Jester, PA-C  12/11/2018, 7:40 AM

## 2018-12-11 NOTE — Progress Notes (Signed)
Physical Therapy Treatment Patient Details Name: [name] [name] MRN: [id number] DOB: Mar 24, 1922 Today's Date: 12/11/2018    History of Present Illness This 85 year old man was admitted for CKD/CHF exacerbation.  PMH:  HTN, Aortic Valve stenosis, gout, bladder and prostate CA, urostomy and nephrostomy, DVT, depression, macular degeneration, and pacemaker    PT Comments    Pt ambulated in hallway with cane and then stood at sink to brush teeth with set up.   Pt assisted to recliner and left with daughter present.  Follow Up Recommendations  No PT follow up;Supervision - Intermittent     Equipment Recommendations  None recommended by PT    Recommendations for Other Services       Precautions / Restrictions Precautions Precautions: Fall Precaution Comments: urostomy; nephrostomy; wears belt to hold nephrostomy; usually puts through his suspenders to keep up Restrictions Weight Bearing Restrictions: No    Mobility  Bed Mobility Overal bed mobility: Needs Assistance Bed Mobility: Supine to Sit     Supine to sit: Supervision     General bed mobility comments: supervision for drains  Transfers Overall transfer level: Needs assistance Equipment used: Straight cane Transfers: Sit to/from Stand Sit to Stand: Min guard         General transfer comment: min/guard for safety, increased time and effort from bed  Ambulation/Gait Ambulation/Gait assistance: Min guard Gait Distance (Feet): 400 Feet Assistive device: Straight cane Gait Pattern/deviations: Step-through pattern;Decreased stride length     General Gait Details: occasionally unsteady however pt self corrects, pt also reports he sometimes uses "stick" in his other hand (typically walks with [facility]), pt denies any symptoms   Stairs             Wheelchair Mobility    Modified Rankin (Stroke Patients Only)       Balance Overall balance assessment: Needs assistance         Standing balance  support: Single extremity supported;During functional activity Standing balance-Leahy Scale: Poor Standing balance comment: utilizes UE support in standing, able to stand and brush teeth at sink with one UE support                            Cognition Arousal/Alertness: Awake/alert Behavior During Therapy: WFL for tasks assessed/performed Overall Cognitive Status: Within Functional Limits for tasks assessed                                        Exercises      General Comments        Pertinent Vitals/Pain Pain Assessment: No/denies pain    Home Living                      Prior Function            PT Goals (current goals can now be found in the care plan section) Progress towards PT goals: Progressing toward goals    Frequency    Min 3X/week      PT Plan Current plan remains appropriate    Co-evaluation              AM-PAC PT "6 Clicks" Mobility   Outcome Measure  Help needed turning from your back to your side while in a flat bed without using bedrails?: A Little Help needed moving from lying on your back to  sitting on the side of a flat bed without using bedrails?: A Little Help needed moving to and from a bed to a chair (including a wheelchair)?: A Little Help needed standing up from a chair using your arms (e.g., wheelchair or bedside chair)?: A Little Help needed to walk in hospital room?: A Little Help needed climbing 3-5 steps with a railing? : A Little 6 Click Score: 18    End of Session Equipment Utilized During Treatment: Gait belt Activity Tolerance: Patient tolerated treatment well Patient left: in chair;with chair alarm set;with call bell/phone within reach;with family/visitor present   PT Visit Diagnosis: Other abnormalities of gait and mobility (R26.89)     Time: 8887-5797 PT Time Calculation (min) (ACUTE ONLY): 17 min  Charges:  $Gait Training: 8-22 mins                    Carmelia Bake, PT,  DPT Acute Rehabilitation Services Office: [phone] Pager: [phone]  Trena Platt 12/11/2018, 1:44 PM

## 2018-12-12 DIAGNOSIS — I5021 Acute systolic (congestive) heart failure: Secondary | ICD-10-CM | POA: Diagnosis not present

## 2018-12-12 DIAGNOSIS — I350 Nonrheumatic aortic (valve) stenosis: Secondary | ICD-10-CM | POA: Diagnosis not present

## 2018-12-12 DIAGNOSIS — I5023 Acute on chronic systolic (congestive) heart failure: Secondary | ICD-10-CM | POA: Diagnosis not present

## 2018-12-12 DIAGNOSIS — I5033 Acute on chronic diastolic (congestive) heart failure: Secondary | ICD-10-CM | POA: Diagnosis not present

## 2018-12-12 DIAGNOSIS — N183 Chronic kidney disease, stage 3 (moderate): Secondary | ICD-10-CM | POA: Diagnosis not present

## 2018-12-12 DIAGNOSIS — I10 Essential (primary) hypertension: Secondary | ICD-10-CM | POA: Diagnosis not present

## 2018-12-12 LAB — BASIC METABOLIC PANEL
Anion gap: 9 (ref 5–15)
Anion gap: 13 (ref 5–15)
BUN: 73 mg/dL — ABNORMAL HIGH (ref 8–23)
BUN: 71 mg/dL — ABNORMAL HIGH (ref 8–23)
CO2: 28 mmol/L (ref 22–32)
CO2: 26 mmol/L (ref 22–32)
Calcium: 8.5 mg/dL — ABNORMAL LOW (ref 8.9–10.3)
Calcium: 8.5 mg/dL — ABNORMAL LOW (ref 8.9–10.3)
Chloride: 102 mmol/L (ref 98–111)
Chloride: 100 mmol/L (ref 98–111)
Creatinine, Ser: 2.98 mg/dL — ABNORMAL HIGH (ref 0.61–1.24)
Creatinine, Ser: 2.9 mg/dL — ABNORMAL HIGH (ref 0.61–1.24)
GFR calc Af Amer: 20 mL/min — ABNORMAL LOW (ref >60)
GFR calc Af Amer: 20 mL/min — ABNORMAL LOW (ref >60)
GFR calc non Af Amer: 17 mL/min — ABNORMAL LOW (ref >60)
GFR calc non Af Amer: 18 mL/min — ABNORMAL LOW (ref >60)
Glucose, Bld: 102 mg/dL — ABNORMAL HIGH (ref 70–99)
Glucose, Bld: 120 mg/dL — ABNORMAL HIGH (ref 70–99)
Potassium: 4.8 mmol/L (ref 3.5–5.1)
Potassium: 4 mmol/L (ref 3.5–5.1)
Sodium: 139 mmol/L (ref 135–145)
Sodium: 139 mmol/L (ref 135–145)

## 2018-12-12 LAB — SODIUM, URINE, RANDOM: Sodium, Ur: 49 mmol/L

## 2018-12-12 LAB — CREATININE, URINE, RANDOM: Creatinine, Urine: 78.28 mg/dL

## 2018-12-12 MED ORDER — POLYETHYLENE GLYCOL 3350 17 G PO PACK
17.0000 g | PACK | Freq: Every day | ORAL | Status: DC
  Administered 2018-12-12: 17 g via ORAL
  Filled 2018-12-12: qty 1

## 2018-12-12 MED ORDER — POLYETHYLENE GLYCOL 3350 17 GM/SCOOP PO POWD: 17.0000 g | Freq: Every day | ORAL | 0 refills | Status: AC | PRN

## 2018-12-12 MED ORDER — METOPROLOL SUCCINATE ER 25 MG PO TB24: 25.0000 mg | ORAL_TABLET | Freq: Every day | ORAL | 0 refills | Status: DC

## 2018-12-12 MED ORDER — FUROSEMIDE 20 MG PO TABS: ORAL_TABLET | ORAL | Status: DC

## 2018-12-12 NOTE — Discharge Instructions (Signed)
[name] [name],  You were in the hospital because of a trouble breathing from a heart failure exacerbation. You were treated with lasix but your kidney function worsened so I recommend for you to hold your lasix. Please call your cardiologist about resuming your lasix as you will need outpatient lab work.

## 2018-12-12 NOTE — Progress Notes (Signed)
PROGRESS NOTE    GARRIS MELHORN  EHU:[id number] DOB: 21-Nov-1921 DOA: 12/09/2018 PCP: Crist Infante, MD   Brief Narrative: WOODROW DRAB is a 85 y.o. male with medical history significant of chronic diastolic CHF Aortic valve stenosis, bladder cancer, CKD, history of DVT after pacemaker insertion in his left arm, gout, hypertension, kidney stones. Patient presented secondary to worsening orthopnea with evidence of acute heart failure.   Assessment & Plan:   Active Problems:   Hypertension   Pacemaker   Aortic stenosis   Hydronephrosis, right   CKD (chronic kidney disease)   HTN (hypertension)   Acute on chronic diastolic CHF (congestive heart failure) (HCC)   CHF exacerbation (HCC)   Acute systolic heart failure New diagnosis of systolic dysfunction. Patient improved with initial IV diuresis overnight. Cardiology consulted. Transthoracic Echocardiogram with an EF of 25-30%. Possibly secondary to AS -Cardiology recommendations: Lasix, metoprolol, outpatient follow-up -Holding lasix secondary to below  History of symptomatic bradycardia Followed by Dr. Lovena Le as an outpatient. Patient is s/p pacemaker  AKI on CKD stage III Secondary to diuresis. Continues to worsen. UOP remains good -Hold Lasix -Repeat BMP this afternoon; urine sodium/creatinine -If continues to worsen, will consult nephrology  Aortic stenosis Moderate on recent Transthoracic Echocardiogram.  Essential hypertension normotensive  Elevated troponin Mild and in line with heart failure. No chest pain. Likely demand.  Pleural effusion Secondary to heart failure. Asymptomatic at this time so no thoracentesis. Outpatient follow-up/surveillance   DVT prophylaxis: Lovenox Code Status:   Code Status: DNR Family Communication: None at bedside Disposition Plan: Discharge pending improvement of AKI   Consultants:   Cardiology  Procedures:   9/17: Transthoracic Echocardiogram IMPRESSIONS    1. Left ventricular ejection fraction, by visual estimation, is 20 to 25%. The left ventricle has severely decreased function. Mildly increased left ventricular size. There is no left ventricular hypertrophy.  2. Multiple segmental abnormalities exist. See findings.  3. Left ventricular diastolic Doppler parameters are consistent with impaired relaxation pattern of LV diastolic filling.  4. Global right ventricle has normal systolic function.The right ventricular size is normal. No increase in right ventricular wall thickness.  5. Left atrial size was severely dilated.  6. Right atrial size was normal.  7. Large pleural effusion in the left lateral region.  8. Small pericardial effusion.  9. The mitral valve is normal in structure. Trace mitral valve regurgitation. No evidence of mitral stenosis. 10. The tricuspid valve is normal in structure. Tricuspid valve regurgitation is mild. 11. The aortic valve is normal in structure. Aortic valve regurgitation is mild by color flow Doppler. Moderate aortic valve stenosis. 12. The pulmonic valve was normal in structure. Pulmonic valve regurgitation is trivial by color flow Doppler. 13. Mildly elevated pulmonary artery systolic pressure. 14. A pacer wire is visualized. 15. The inferior vena cava is normal in size with greater than 50% respiratory variability, suggesting right atrial pressure of 3 mmHg.  Antimicrobials:  None    Subjective: Sleeping better. Able to lie down flat  Objective: Vitals:   12/11/18 0505 12/11/18 1317 12/11/18 2131 12/12/18 0644  BP: 137/72 111/64 (!) 122/55 (!) 143/75  Pulse: 73 60 (!) 59 (!) 57  Resp: 16 17 16 16   Temp: 97.7 F (36.5 C) (!) 97.5 F (36.4 C) 98 F (36.7 C) 97.9 F (36.6 C)  TempSrc: Oral Oral Oral Oral  SpO2: 96% 99% 98% 97%  Weight: 65.5 kg   65.4 kg  Height:        Intake/Output  Summary (Last 24 hours) at 12/12/2018 0930 Last data filed at 12/12/2018 0700 Gross per 24 hour  Intake 763 ml   Output 1542 ml  Net -779 ml   Filed Weights   12/10/18 0141 12/11/18 0505 12/12/18 0644  Weight: 66.9 kg 65.5 kg 65.4 kg    Examination:  General exam: Appears calm and comfortable Respiratory system: Clear to auscultation with diminished breath sounds on right compared to left. Respiratory effort normal. Cardiovascular system: S1 & S2 heard, RRR. 2/6 systolic murmur. Gastrointestinal system: Abdomen is mildly distended, soft and nontender. No organomegaly or masses felt. Normal bowel sounds heard. Central nervous system: Alert and oriented. No focal neurological deficits. Extremities: No edema. No calf tenderness Skin: No cyanosis. No rashes Psychiatry: Judgement and insight appear normal. Mood & affect appropriate.     Data Reviewed: I have personally reviewed following labs and imaging studies  CBC: Recent Labs  Lab 12/09/18 2042 12/10/18 0509  WBC 7.4 6.8  HGB 12.6* 12.0*  HCT 39.7 38.2*  MCV 100.5* 99.2  PLT 234 546   Basic Metabolic Panel: Recent Labs  Lab 12/09/18 2042 12/10/18 0509 12/11/18 0819 12/11/18 1201 12/12/18 0331  NA 142 141 141 141 139  K 4.3 4.2 4.8 5.1 4.8  CL 103 106 102 100 102  CO2 27 27 27 29 28   GLUCOSE 117* 82 97 121* 102*  BUN 47* 45* 58* 61* 73*  CREATININE 2.07* 1.88* 2.53* 2.81* 2.98*  CALCIUM 9.1 8.8* 8.9 9.1 8.5*  MG 2.7* 2.4  --   --   --   PHOS  --  3.4  --   --   --    GFR: Estimated Creatinine Clearance: 13.7 mL/min (A) (by C-G formula based on SCr of 2.98 mg/dL (H)). Liver Function Tests: Recent Labs  Lab 12/10/18 0509  AST 20  ALT 14  ALKPHOS 40  BILITOT 0.6  PROT 7.1  ALBUMIN 3.5   No results for input(s): LIPASE, AMYLASE in the last 168 hours. No results for input(s): AMMONIA in the last 168 hours. Coagulation Profile: No results for input(s): INR, PROTIME in the last 168 hours. Cardiac Enzymes: No results for input(s): CKTOTAL, CKMB, CKMBINDEX, TROPONINI in the last 168 hours. BNP (last 3 results) No  results for input(s): PROBNP in the last 8760 hours. HbA1C: No results for input(s): HGBA1C in the last 72 hours. CBG: No results for input(s): GLUCAP in the last 168 hours. Lipid Profile: No results for input(s): CHOL, HDL, LDLCALC, TRIG, CHOLHDL, LDLDIRECT in the last 72 hours. Thyroid Function Tests: Recent Labs    12/10/18 0509 12/10/18 1714 12/10/18 1715  TSH 5.300*  --   --   FREET4  --  0.98  --   T3FREE  --   --  1.9*   Anemia Panel: No results for input(s): VITAMINB12, FOLATE, FERRITIN, TIBC, IRON, RETICCTPCT in the last 72 hours. Sepsis Labs: No results for input(s): PROCALCITON, LATICACIDVEN in the last 168 hours.  Recent Results (from the past 240 hour(s))  SARS CORONAVIRUS 2 (TAT 6-24 HRS) Nasopharyngeal Nasopharyngeal Swab     Status: None   Collection Time: 12/09/18 10:55 PM   Specimen: Nasopharyngeal Swab  Result Value Ref Range Status   SARS Coronavirus 2 NEGATIVE NEGATIVE Final    Comment: (NOTE) SARS-CoV-2 target nucleic acids are NOT DETECTED. The SARS-CoV-2 RNA is generally detectable in upper and lower respiratory specimens during the acute phase of infection. Negative results do not preclude SARS-CoV-2 infection, do not rule out  co-infections with other pathogens, and should not be used as the sole basis for treatment or other patient management decisions. Negative results must be combined with clinical observations, patient history, and epidemiological information. The expected result is Negative. Fact Sheet for Patients: SugarRoll.be Fact Sheet for Healthcare Providers: https://www.woods-mathews.com/ This test is not yet approved or cleared by the [place] FDA and  has been authorized for detection and/or diagnosis of SARS-CoV-2 by FDA under an Emergency Use Authorization (EUA). This EUA will remain  in effect (meaning this test can be used) for the duration of the COVID-19 declaration under Section 56  4(b)(1) of the Act, 21 U.S.C. section 360bbb-3(b)(1), unless the authorization is terminated or revoked sooner. Performed at [facility], [place] [address] [place], [place] [address]          Radiology Studies: No results found.      Scheduled Meds: . aspirin EC  81 mg Oral Daily  . enoxaparin (LOVENOX) injection  30 mg Subcutaneous Daily  . febuxostat  40 mg Oral Daily  . metoprolol succinate  25 mg Oral Daily  . sodium chloride flush  3 mL Intravenous Q12H   Continuous Infusions: . sodium chloride       LOS: 0 days     Cordelia Poche, MD [facility] 12/12/2018, 9:30 AM  If 7PM-7AM, please contact night-coverage [email or web address]

## 2018-12-16 ENCOUNTER — Encounter: Payer: Self-pay | Admitting: Cardiovascular Disease

## 2018-12-16 ENCOUNTER — Other Ambulatory Visit: Payer: Self-pay

## 2018-12-16 ENCOUNTER — Ambulatory Visit: Payer: Medicare HMO | Admitting: Cardiovascular Disease

## 2018-12-16 VITALS — BP 148/66 | HR 64 | Ht 67.5 in | Wt 149.4 lb

## 2018-12-16 DIAGNOSIS — I350 Nonrheumatic aortic (valve) stenosis: Secondary | ICD-10-CM | POA: Diagnosis not present

## 2018-12-16 MED ORDER — FUROSEMIDE 20 MG PO TABS: 20.0000 mg | ORAL_TABLET | Freq: Every day | ORAL | 3 refills | Status: DC

## 2018-12-16 NOTE — Patient Instructions (Addendum)
Medication Instructions:  1) RESTART LASIX 20 mg daily. This has been called in for you.  Labwork: TODAY: BMET  Testing/Procedures: None  Follow-Up: You have an appointment scheduled with Dr. Angelena Form 03/11/2019 at 2:20PM.

## 2018-12-16 NOTE — Progress Notes (Signed)
Structural Heart Clinic Consult Note  Chief Complaint  Patient presents with  . New Patient (Initial Visit)    severe aortic stenosis    History of Present Illness:85 yo male with history of diastolic and systolic CHF, bladder cancer, prostate cancer, DVT, HTN, stage 3 chronic kidney disease, symptomatic bradycardia s/p pacemaker and aortic stenosis who is referred to the valve clinic today as a new consult by Dr. Marry Guan, for further discussion regarding his aortic stenosis and possible TAVR. He has been followed in our office by Dr. Cristopher Peru. Pacemaker in place for symptomatic bradycardia. He has been known to have moderate aortic stenosis for at least the past five years. He was admitted to [facility] 12/09/18 with volume overload. Echo 12/10/18 severely reduced LV systolic function, KPTW=65-68%. Severe left atrial enlargement. The aortic valve leaflets are thickened and calcified with poor leaflet excursion. Mean gradient 29 mmHg, peak gradient 49 mmHg. AVA 0.67 cm2. Dimensionless index 0.26. He appears to have low flow, low gradient aortic stenosis. His most recent prior echo was in 2013 and at that time he had normal LV systolic function and mild to moderate aortic stenosis. He was diuresed last week with IV Lasix and had symptomatic improvement. He has undergone many procedures on his bladder. He has had a cystectomy and has a urostomy bag in place. He also has a percutaneous drain into his right kidney.   He tells me today that he has been doing better since he left the hospital last week. He restarted his Lasix 20 mg yesterday. His breathing has been much better. No dizziness or near syncope. No chest pain. He lives alone. He does not go to the dentist but does not know of any active dental issues. He is a retired Psychologist, sport and exercise. He still walks around his yard and feeds his cats. He is legally blind. He is here today with his daughter.   Primary Care Physician: Crist Infante, MD Primary  Cardiologist: Cristopher Peru Referring Cardiologist: Eleonore Chiquito  Past Medical History:  Diagnosis Date  . Aortic valve stenosis    a. mild to moderate by echo 2013  . Bradycardia    MDT dual chamber pacemaker  . Cancer [facility])    bladder cancer  . Chronic kidney disease   . Deep venous thrombosis (HCC)    hx of  LEFT ARM AFTER PACEMAKER INSERTION ABOUT 6 YRS AGO  . Depression   . Gout   . Hypertension   . Kidney stone   . Macular degeneration    PT STATES HIS VISION STILL OKAY FOR DRIVING  . Osteoporosis   . Prostate cancer ([place])    AND BLADDER CANCER - S/P URETEROILEAL CONDUIT  . Renal disorder   . S/P ileal conduit [facility])         Past Surgical History:  Procedure Laterality Date  . 07/20/13  CONVERSION OF RIGHT SIDED NEPHROSTOMY CATHETER TO RIGHT SIDED NEPHRO URETERAL CATHETER - DONE IN INTERVENTIONAL RADIOLOGY    . APPENDECTOMY    . CATARACT EXTRACTION, BILATERAL    . CYSTECTOMY    . ilial conduit     for bladder cancer  . IR GENERIC HISTORICAL  10/26/2015   IR NEPHROSTOMY TUBE CHANGE 10/26/2015 Jacqulynn Cadet, MD [facility]-INTERV RAD  . IR GENERIC HISTORICAL  12/07/2015   IR NEPHROSTOMY TUBE CHANGE 12/07/2015 Greggory Keen, MD [facility]  . IR GENERIC HISTORICAL  01/11/2016   IR NEPHROSTOMY TUBE CHANGE 01/11/2016 Greggory Keen, MD [facility]  . IR  GENERIC HISTORICAL  02/08/2016   IR NEPHROSTOMY TUBE CHANGE 02/08/2016 [facility]  . IR GENERIC HISTORICAL  03/14/2016   IR NEPHROSTOMY TUBE CHANGE 03/14/2016 Jacqulynn Cadet, MD [facility]  . IR GENERIC HISTORICAL  04/22/2016   IR NEPHROSTOMY TUBE CHANGE 04/22/2016 Aletta Edouard, MD [facility]  . IR GENERIC HISTORICAL  05/27/2016   IR NEPHROSTOMY TUBE CHANGE 05/27/2016 Arne Cleveland, MD [facility]  . IR NEPHROSTOMY EXCHANGE RIGHT  07/25/2017  . IR NEPHROSTOMY EXCHANGE RIGHT  10/13/2017  . IR NEPHROSTOMY EXCHANGE RIGHT  11/18/2017  . IR NEPHROSTOMY EXCHANGE RIGHT  12/30/2017  . IR NEPHROSTOMY EXCHANGE RIGHT  02/10/2018   . IR NEPHROSTOMY EXCHANGE RIGHT  03/23/2018  . IR NEPHROSTOMY EXCHANGE RIGHT  05/04/2018  . IR NEPHROSTOMY EXCHANGE RIGHT  06/25/2018  . IR NEPHROSTOMY EXCHANGE RIGHT  08/13/2018  . IR NEPHROSTOMY EXCHANGE RIGHT  10/01/2018  . IR NEPHROSTOMY EXCHANGE RIGHT  11/19/2018  . IR NEPHROSTOMY PLACEMENT RIGHT  06/13/2017  . IR NEPHROSTOMY TUBE CHANGE  07/01/2016  . IR NEPHROSTOMY TUBE CHANGE  08/05/2016  . IR NEPHROSTOMY TUBE CHANGE  09/18/2016  . IR NEPHROSTOMY TUBE CHANGE  10/24/2016  . IR NEPHROSTOMY TUBE CHANGE  11/18/2016  . IR NEPHROSTOMY TUBE CHANGE  12/30/2016  . IR NEPHROSTOMY TUBE CHANGE  02/03/2017  . IR NEPHROSTOMY TUBE CHANGE  03/13/2017  . IR NEPHROSTOMY TUBE CHANGE  04/17/2017  . IR NEPHROSTOMY TUBE CHANGE  05/28/2017  . IR NEPHROSTOMY TUBE CHANGE  09/04/2017  . KIDNEY STONE SURGERY    . LYMPHADENECTOMY    . NEPHROLITHOTOMY  09/02/2011   Procedure: NEPHROLITHOTOMY PERCUTANEOUS;  Surgeon: Malka So, MD;  Location: [facility] ORS;  Service: Urology;  Laterality: Left;  . NEPHROLITHOTOMY Right 07/27/2013   Procedure: RIGHT PERCUTANEOUS NEPHROLITHOTOMY ;  Surgeon: Irine Seal, MD;  Location: [facility];  Service: Urology;  Laterality: Right;  . PACEMAKER INSERTION  2009   MDT dual chamber pacemaker implanted by Dr Verlon Setting  . PROSTATE SURGERY      Current Outpatient Medications  Medication Sig Dispense Refill  . acetaminophen (TYLENOL) 500 MG tablet Take 1,000 mg by mouth every 8 (eight) hours as needed for mild pain or headache.    Marland Kitchen aspirin EC 81 MG tablet Take 81 mg by mouth every morning.    Marland Kitchen Besifloxacin HCl (BESIVANCE) 0.6 % SUSP Apply 1 drop to eye See admin instructions. Only uses before eye shot he receives every 10 weeks.    . Cholecalciferol (VITAMIN D-3) 1000 UNITS CAPS Take 1 capsule by mouth every morning.    . colchicine 0.6 MG tablet Take 0.6 mg by mouth as needed (gout).     Marland Kitchen docusate sodium (COLACE) 100 MG capsule Take 300 mg by mouth daily.     . furosemide (LASIX) 20 MG tablet Take 1  tablet (20 mg total) by mouth daily. 90 tablet 3  . Hyprom-Naphaz-Polysorb-Zn Sulf (CLEAR EYES COMPLETE OP) Apply 1-2 drops to eye daily as needed (dry eyes).    . metoprolol succinate (TOPROL-XL) 25 MG 24 hr tablet Take 1 tablet (25 mg total) by mouth daily. 30 tablet 0  . Multiple Vitamins-Minerals (VITEYES AREDS ADVANCED PO) Take 2 capsules by mouth daily.    . OMEGA-3 KRILL OIL PO Take 1 tablet by mouth every morning.    . polyethylene glycol powder (GLYCOLAX/MIRALAX) 17 GM/SCOOP powder Take 17 g by mouth daily as needed for mild constipation. Takes 17grams (1 capful) as needed. 255 g 0  . ULORIC 40 MG  tablet Take 1 tablet by mouth daily.     No current facility-administered medications for this visit.     No Known Allergies  Social History   Socioeconomic History  . Marital status: Widowed    Spouse name: Not on file  . Number of children: 3  . Years of education: Not on file  . Highest education level: Not on file  Occupational History  . Occupation: IT sales professional: RETIRED  Social Needs  . Financial resource strain: Not on file  . Food insecurity    Worry: Not on file    Inability: Not on file  . Transportation needs    Medical: Not on file    Non-medical: Not on file  Tobacco Use  . Smoking status: Former Smoker    Types: Cigarettes    Quit date: 03/25/1952    Years since quitting: 66.7  . Smokeless tobacco: Never Used  Substance and Sexual Activity  . Alcohol use: No  . Drug use: No  . Sexual activity: Never  Lifestyle  . Physical activity    Days per week: Not on file    Minutes per session: Not on file  . Stress: Not on file  Relationships  . Social Herbalist on phone: Not on file    Gets together: Not on file    Attends religious service: Not on file    Active member of club or organization: Not on file    Attends meetings of clubs or organizations: Not on file    Relationship status: Not on file  . Intimate partner violence    Fear of  current or ex partner: Not on file    Emotionally abused: Not on file    Physically abused: Not on file    Forced sexual activity: Not on file  Other Topics Concern  . Not on file  Social History Narrative   ** Merged History Encounter **        Family History  Problem Relation Age of Onset  . Pancreatic cancer Father 2       died  . Pancreatic cancer Mother 69       died    Review of Systems:  As stated in the HPI and otherwise negative.   BP (!) 148/66 (BP Location: Left Arm, Patient Position: Sitting, Cuff Size: Normal)   Pulse 64   Ht 5' 7.5" (1.715 m)   Wt 149 lb 6.4 oz (67.8 kg)   SpO2 (!) 85%   BMI 23.05 kg/m   Physical Examination: General: Thin, frail elderly male in NAD.  HEENT: OP clear, mucus membranes moist  SKIN: warm, dry. No rashes. Neuro: No focal deficits  Musculoskeletal: Muscle strength 5/5 all ext  Psychiatric: Mood and affect normal  Neck: No JVD, no carotid bruits, no thyromegaly, no lymphadenopathy.  Lungs:Clear bilaterally, no wheezes, rhonci, crackles Cardiovascular: Regular rate and rhythm. Loud, harsh, late peaking systolic murmur.  Abdomen:Soft. Bowel sounds present. Non-tender.  Extremities: No lower extremity edema. Pulses are 2 + in the bilateral DP/PT.  EKG:  EKG is not ordered today. The ekg ordered today demonstrates   Echo 12/10/18:  1. Left ventricular ejection fraction, by visual estimation, is 20 to 25%. The left ventricle has severely decreased function. Mildly increased left ventricular size. There is no left ventricular hypertrophy.  2. Multiple segmental abnormalities exist. See findings.  3. Left ventricular diastolic Doppler parameters are consistent with impaired relaxation pattern of LV diastolic filling.  4. Global right ventricle has normal systolic function.The right ventricular size is normal. No increase in right ventricular wall thickness.  5. Left atrial size was severely dilated.  6. Right atrial size was  normal.  7. Large pleural effusion in the left lateral region.  8. Small pericardial effusion.  9. The mitral valve is normal in structure. Trace mitral valve regurgitation. No evidence of mitral stenosis. 10. The tricuspid valve is normal in structure. Tricuspid valve regurgitation is mild. 11. The aortic valve is normal in structure. Aortic valve regurgitation is mild by color flow Doppler. Moderate aortic valve stenosis. 12. The pulmonic valve was normal in structure. Pulmonic valve regurgitation is trivial by color flow Doppler. 13. Mildly elevated pulmonary artery systolic pressure. 14. A pacer wire is visualized. 15. The inferior vena cava is normal in size with greater than 50% respiratory variability, suggesting right atrial pressure of 3 mmHg.  FINDINGS  Left Ventricle: Left ventricular ejection fraction, by visual estimation, is 20 to 25%. The left ventricle has severely decreased function. There is no left ventricular hypertrophy. Mildly increased left ventricular size. Spectral Doppler shows Left  ventricular diastolic Doppler parameters are consistent with impaired relaxation pattern of LV diastolic filling. Global hypokinesis.    LV Wall Scoring: The entire inferior septum is akinetic. The entire anterior wall, entire inferior wall, basal and mid anterior septum, basal and mid inferolateral wall, and apex are hypokinetic.  Right Ventricle: The right ventricular size is normal. No increase in right ventricular wall thickness. Global RV systolic function is has normal systolic function. The tricuspid regurgitant velocity is 2.51 m/s, and with an assumed right atrial pressure  of 8 mmHg, the estimated right ventricular systolic pressure is mildly elevated at 33.2 mmHg.  Left Atrium: Left atrial size was severely dilated.  Right Atrium: Right atrial size was normal in size  Pericardium: A small pericardial effusion is present. There is a large pleural effusion in the left  lateral region.  Mitral Valve: The mitral valve is normal in structure. No evidence of mitral valve stenosis by observation. Trace mitral valve regurgitation.  Tricuspid Valve: The tricuspid valve is normal in structure. Tricuspid valve regurgitation is mild by color flow Doppler.  Aortic Valve: The aortic valve is normal in structure. Aortic valve regurgitation is mild by color flow Doppler. Aortic regurgitation PHT measures 797 msec. Moderate aortic stenosis is present. Aortic valve mean gradient measures 29.3 mmHg. Aortic valve  peak gradient measures 49.3 mmHg. Aortic valve area, by VTI measures 0.75 cm. Severely calcified and thickened aortic valve leaflets. Cannot rule out low flow, low gradient severe aortic stenosis.  Pulmonic Valve: The pulmonic valve was normal in structure. Pulmonic valve regurgitation is trivial by color flow Doppler.  Aorta: The aortic root, ascending aorta and aortic arch are all structurally normal, with no evidence of dilitation or obstruction.  Venous: The inferior vena cava was not well visualized. The inferior vena cava is normal in size with greater than 50% respiratory variability, suggesting right atrial pressure of 3 mmHg.  Shunts: No ventricular septal defect is seen or detected. There is no evidence of an atrial septal defect. No atrial level shunt detected by color flow Doppler.  Additional Comments: A pacer wire is visualized.   LEFT VENTRICLE          Normals PLAX 2D LVIDd:         5.50 cm  3.6 cm LVIDs:         4.60 cm  1.7 cm LV  PW:         0.70 cm  1.4 cm LV IVS:        0.70 cm  1.3 cm LVOT diam:     1.90 cm  2.0 cm LV SV:         50 ml    79 ml LV SV Index:   28.01    45 ml/m2 LVOT Area:     2.84 cm 3.14 cm2   LV Volumes (MOD)             Normals LV area d, A2C:    38.10 cm LV area d, A4C:    42.60 cm LV area s, A2C:    31.80 cm LV area s, A4C:    34.50 cm LV major d, A2C:   8.90 cm LV major d, A4C:   9.70 cm LV major  s, A2C:   8.62 cm LV major s, A4C:   8.76 cm LV vol d, MOD A2C: 134.0 ml  68 ml LV vol d, MOD A4C: 151.0 ml LV vol s, MOD A2C: 101.0 ml  24 ml LV vol s, MOD A4C: 113.0 ml LV SV MOD A2C:     33.0 ml LV SV MOD A4C:     151.0 ml LV SV MOD BP:      41.1 ml   45 ml  RIGHT VENTRICLE TAPSE (M-mode): 0.9 cm  LEFT ATRIUM              Index LA diam:        3.20 cm  1.79 cm/m LA Vol (A2C):   108.0 ml 60.45 ml/m LA Vol (A4C):   55.1 ml  30.84 ml/m LA Biplane Vol: 78.7 ml  44.05 ml/m  AORTIC VALVE                    Normals AV Area (Vmax):    0.76 cm AV Area (Vmean):   0.67 cm     3.06 cm2 AV Area (VTI):     0.75 cm AV Vmax:           351.00 cm/s AV Vmean:          253.333 cm/s 77 cm/s AV VTI:            0.777 m      3.15 cm2 AV Peak Grad:      49.3 mmHg AV Mean Grad:      29.3 mmHg    3 mmHg LVOT Vmax:         94.60 cm/s LVOT Vmean:        59.600 cm/s  75 cm/s LVOT VTI:          0.205 m      25.3 cm LVOT/AV VTI ratio: 0.26         1 AI PHT:            797 msec   AORTA                 Normals Ao Root diam: 2.80 cm 31 mm  MR Peak grad: 129.0 mmHg  TRICUSPID VALVE             Normals MR Vmax:      568.00 cm/s TR Peak grad:   25.2 mmHg                           TR Vmax:  251.00 cm/s 288 cm/s                             SHUNTS                           Systemic VTI:  0.20 m                           Systemic Diam: 1.90 cm  Recent Labs: 12/09/2018: B Natriuretic Peptide 3,559.2 12/10/2018: ALT 14; Hemoglobin 12.0; Magnesium 2.4; Platelets 228; TSH 5.300 12/12/2018: BUN 71; Creatinine, Ser 2.90; Potassium 4.0; Sodium 139    Wt Readings from Last 3 Encounters:  12/16/18 149 lb 6.4 oz (67.8 kg)  12/12/18 144 lb 1.6 oz (65.4 kg)  12/16/17 152 lb 9.6 oz (69.2 kg)     Other studies Reviewed: Additional studies/ records that were reviewed today include: Echo images, hospital notes, office notes.  Review of the above records demonstrates: Severe AS  STS Risk Score: Risk  of Mortality: 9.260% Renal Failure: 27.616% Permanent Stroke: 1.886% Prolonged Ventilation: 28.777% DSW Infection: 0.132% Reoperation: 6.328% Morbidity or Mortality: 48.286% Short Length of Stay: 7.356% Long Length of Stay: 30.228%   Assessment and Plan:   1. Severe Aortic Valve Stenosis: He likely has low flow, low gradient severe aortic stenosis. His symptoms are much improved after treatment of his acute volume overload. He has severe LV systolic dysfunction. I have personally reviewed the echo images. The aortic valve is thickened, calcified with limited leaflet mobility. I have reviewed the natural history of aortic stenosis with the patient and their family members  who are present today. We have discussed the limitations of medical therapy and the poor prognosis associated with symptomatic aortic stenosis. We have reviewed potential treatment options, including palliative medical therapy, conventional surgical aortic valve replacement, and transcatheter aortic valve replacement. We discussed treatment options in the context of the patient's specific comorbid medical conditions.   I think he would benefit from AVR but given his advanced age, advanced renal disease and indwelling nephrostomy tube, which places him at risk for infection, he is not the best candidate for any cardiac procedure. He is absolutely not a candidate for conventional AVR by surgical approach. We could consider TAVR but I do not think this is appropriate for him. He agrees with this. He does not wish to move forward with any invasive procedures.  His daughter agrees.   He will continue Lasix daily. He will follow daily weights. He will use extra Lasix as needed. BMET today. Will not plan any further testing for TAVR at this time. I will see him back in the valve clinic in 3 months.   Current medicines are reviewed at length with the patient today.  The patient does not have concerns regarding medicines.  The  following changes have been made:  no change  Labs/ tests ordered today include:   Orders Placed This Encounter  Procedures  . Basic metabolic panel     Disposition:   FU with me in three months.    Signed, Lauree Chandler, MD 12/16/2018 3:58 PM    [facility] [address], [place], [place]  [address] Phone: [phone]; Fax: [phone]

## 2018-12-17 LAB — BASIC METABOLIC PANEL
BUN/Creatinine Ratio: 26 — ABNORMAL HIGH (ref 10–24)
BUN: 58 mg/dL — ABNORMAL HIGH (ref 10–36)
CO2: 23 mmol/L (ref 20–29)
Calcium: 8.8 mg/dL (ref 8.6–10.2)
Chloride: 106 mmol/L (ref 96–106)
Creatinine, Ser: 2.19 mg/dL — ABNORMAL HIGH (ref 0.76–1.27)
GFR calc Af Amer: 29 mL/min/{1.73_m2} — ABNORMAL LOW (ref >59)
GFR calc non Af Amer: 25 mL/min/{1.73_m2} — ABNORMAL LOW (ref >59)
Glucose: 92 mg/dL (ref 65–99)
Potassium: 5.1 mmol/L (ref 3.5–5.2)
Sodium: 143 mmol/L (ref 134–144)

## 2018-12-23 DIAGNOSIS — R69 Illness, unspecified: Secondary | ICD-10-CM | POA: Diagnosis not present

## 2018-12-28 DIAGNOSIS — E7849 Other hyperlipidemia: Secondary | ICD-10-CM | POA: Diagnosis not present

## 2018-12-28 DIAGNOSIS — M859 Disorder of bone density and structure, unspecified: Secondary | ICD-10-CM | POA: Diagnosis not present

## 2018-12-28 DIAGNOSIS — Z125 Encounter for screening for malignant neoplasm of prostate: Secondary | ICD-10-CM | POA: Diagnosis not present

## 2018-12-30 ENCOUNTER — Encounter (INDEPENDENT_AMBULATORY_CARE_PROVIDER_SITE_OTHER): Payer: Medicare HMO | Admitting: Ophthalmology

## 2018-12-30 DIAGNOSIS — H353114 Nonexudative age-related macular degeneration, right eye, advanced atrophic with subfoveal involvement: Secondary | ICD-10-CM | POA: Diagnosis not present

## 2018-12-30 DIAGNOSIS — H35033 Hypertensive retinopathy, bilateral: Secondary | ICD-10-CM | POA: Diagnosis not present

## 2018-12-30 DIAGNOSIS — I10 Essential (primary) hypertension: Secondary | ICD-10-CM

## 2018-12-30 DIAGNOSIS — H353221 Exudative age-related macular degeneration, left eye, with active choroidal neovascularization: Secondary | ICD-10-CM

## 2018-12-30 DIAGNOSIS — H43813 Vitreous degeneration, bilateral: Secondary | ICD-10-CM

## 2018-12-30 IMAGING — XA IR BILIARY CATHETER EXCHANGE
1 series · 4 of 4 positions shown · non-contrast
Comparison: Multiple previous fluoroscopic guided nephroureteral
catheter exchanges, most recently on 02/03/2017

INDICATION: Ureteral stricture with chronic right-sided nephroureteral catheter
are via ostomy. Patient presents today for routine fluoroscopic
guided exchange.

EXAM:
FLUOROSCOPIC GUIDED RIGHT SIDED NEPHROURETERAL CATHETER EXCHANGE
TECHNIQUE: Informed written consent was obtained from the patient after a
discussion of the risks, benefits and alternatives to treatment.
Questions regarding the procedure were encouraged and answered. A
timeout was performed prior to the initiation of the procedure.

[Series 300: tube placements · 4 of 4 slices shown]
[im 1/4]
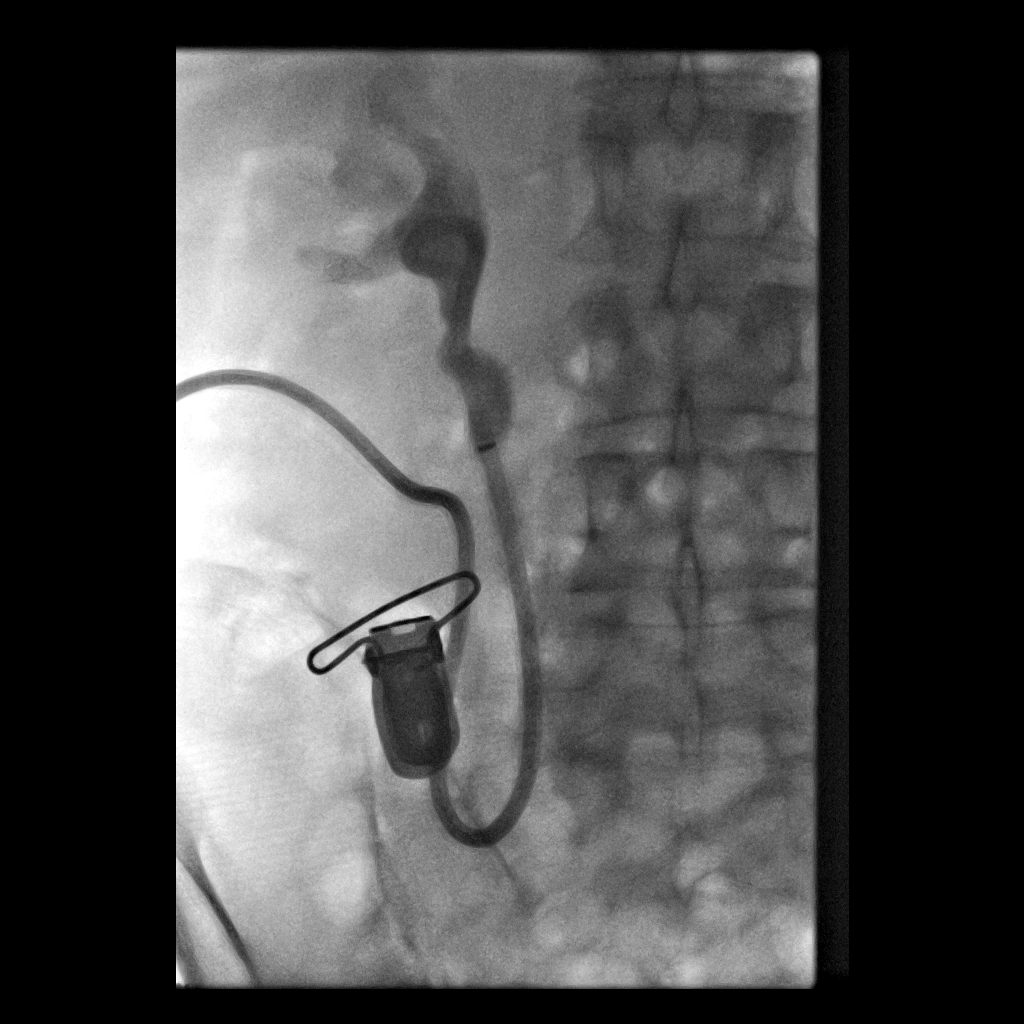
[im 2/4]
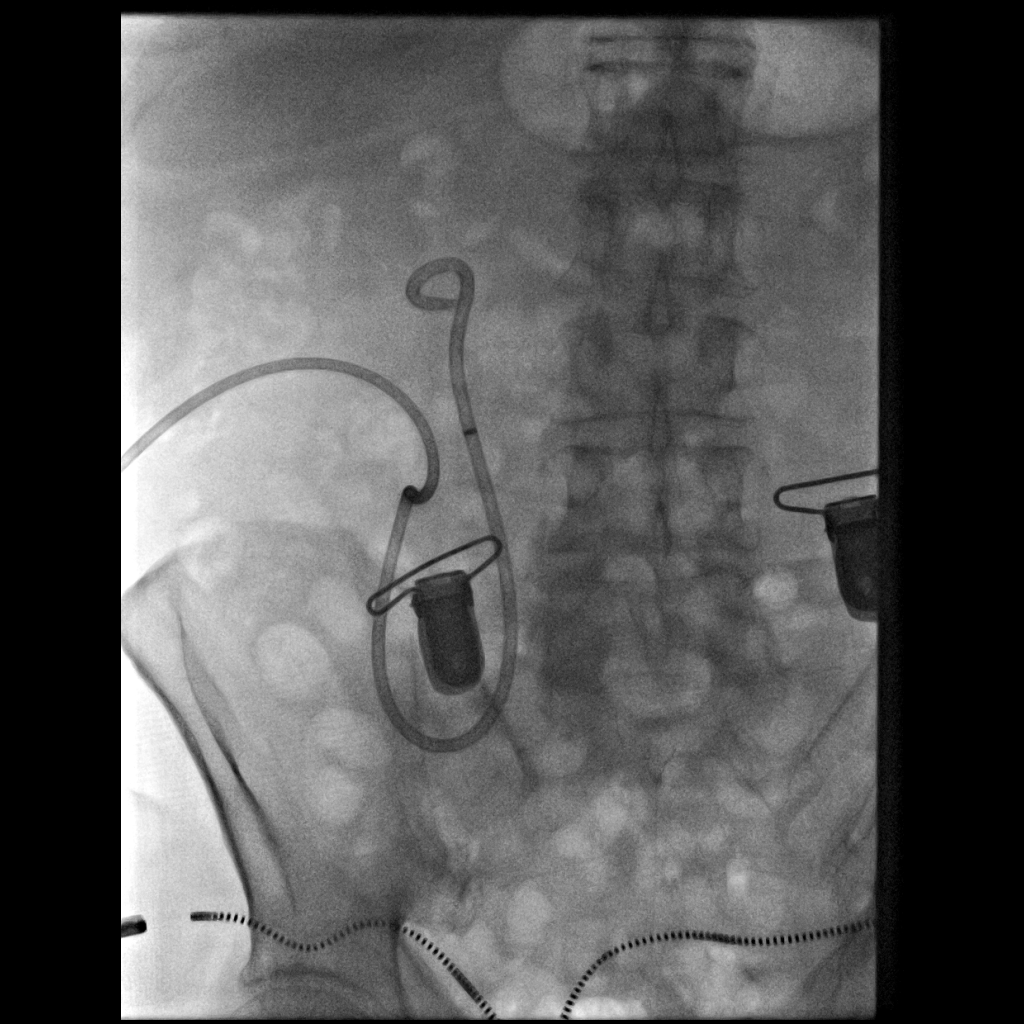
[im 3/4]
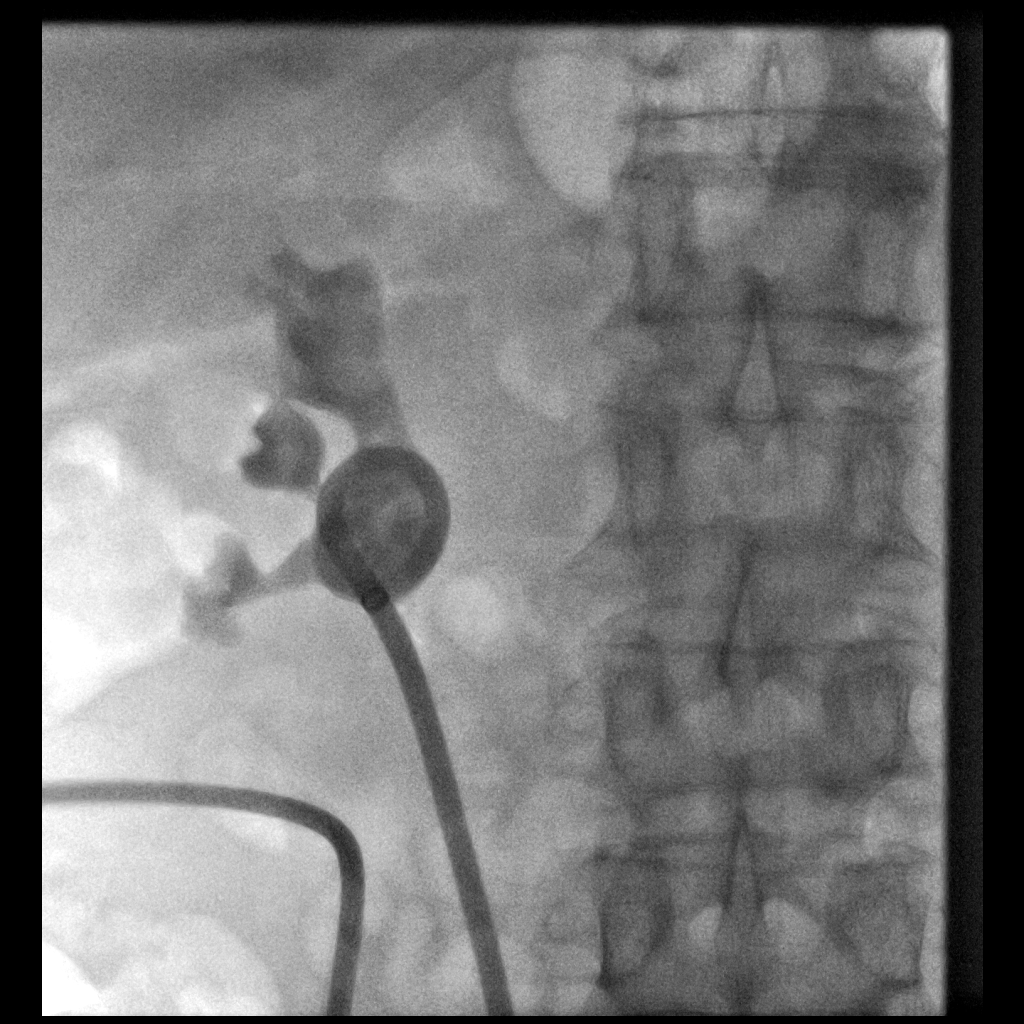
[im 4/4]
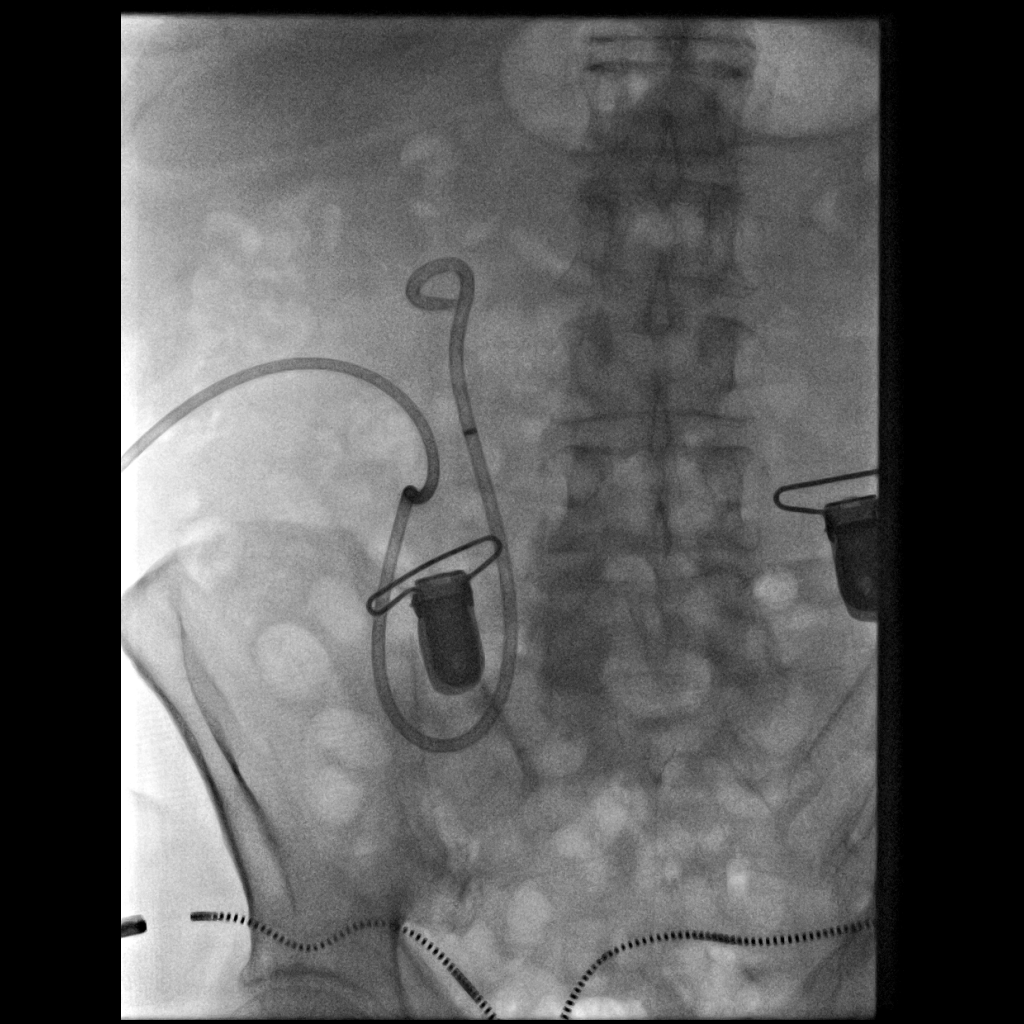

[4 of 4 positions shown; findings below may reference images not displayed]

CONTRAST:  10 mL Bsovue-UTT administered into the collecting system

FLUOROSCOPY TIME:  3 minutes, 18 seconds (59 MGy)

COMPLICATIONS:
None immediate.
External portion of the existing retrograde nephroureteral catheter
as well as the surrounding skin was draped in the usual sterile
fashion. A sterile drape was applied covering the operative field.
Maximum barrier sterile technique with sterile gowns and gloves were
used for the procedure. A timeout was performed prior to the
initiation of the procedure.

A pre procedural spot fluoroscopic image was obtained after contrast
was injected via the existing nephroureteral catheter demonstrating
appropriate positioning within the renal pelvis. The existing
catheter was cut and cannulated with an Amplatz wire which was
coiled within the renal pelvis. Under intermittent fluoroscopic
guidance, the existing nephrostomy catheter was exchanged for a new
10 French, 45 cm all-purpose drainage catheter. Contrast injection
confirmed appropriate positioning within the renal pelvis and a post
exchange fluoroscopic image was obtained. The catheter was locked
and the patient's urostomy was reapplied. The patient tolerated the
procedure well without immediate postprocedural complication.
FINDINGS: The existing nephroureteral catheter is appropriately positioned and
functioning.

Again, significant encrustation was noted about the right-sided
nephroureteral catheter.

After successful fluoroscopic guided exchange, the new nephrostomy
catheter is coiled and locked within the right renal pelvis.
IMPRESSION: Successful fluoroscopic guided exchange of right sided 10 French, 45
cm nephroureteral catheter.

PLAN:
Routine fluoroscopic guided right-sided nephroureteral catheter
exchange will be performed in 5 weeks.

I would NOT recommend extending the exchange interval as there was
significant encrustation on the patient's catheter.

## 2019-01-04 DIAGNOSIS — Z1331 Encounter for screening for depression: Secondary | ICD-10-CM | POA: Diagnosis not present

## 2019-01-04 DIAGNOSIS — Z0000 Encounter for general adult medical examination without abnormal findings: Secondary | ICD-10-CM | POA: Diagnosis not present

## 2019-01-04 DIAGNOSIS — I130 Hypertensive heart and chronic kidney disease with heart failure and stage 1 through stage 4 chronic kidney disease, or unspecified chronic kidney disease: Secondary | ICD-10-CM | POA: Diagnosis not present

## 2019-01-04 DIAGNOSIS — N184 Chronic kidney disease, stage 4 (severe): Secondary | ICD-10-CM | POA: Diagnosis not present

## 2019-01-04 DIAGNOSIS — R0600 Dyspnea, unspecified: Secondary | ICD-10-CM | POA: Diagnosis not present

## 2019-01-04 DIAGNOSIS — Z936 Other artificial openings of urinary tract status: Secondary | ICD-10-CM | POA: Diagnosis not present

## 2019-01-04 DIAGNOSIS — I509 Heart failure, unspecified: Secondary | ICD-10-CM | POA: Diagnosis not present

## 2019-01-04 DIAGNOSIS — C61 Malignant neoplasm of prostate: Secondary | ICD-10-CM | POA: Diagnosis not present

## 2019-01-04 DIAGNOSIS — C679 Malignant neoplasm of bladder, unspecified: Secondary | ICD-10-CM | POA: Diagnosis not present

## 2019-01-04 DIAGNOSIS — R69 Illness, unspecified: Secondary | ICD-10-CM | POA: Diagnosis not present

## 2019-01-04 DIAGNOSIS — E875 Hyperkalemia: Secondary | ICD-10-CM | POA: Diagnosis not present

## 2019-01-05 ENCOUNTER — Other Ambulatory Visit (HOSPITAL_COMMUNITY): Payer: Self-pay | Admitting: Diagnostic Radiology

## 2019-01-05 ENCOUNTER — Encounter (HOSPITAL_COMMUNITY): Payer: Self-pay | Admitting: Diagnostic Radiology

## 2019-01-05 ENCOUNTER — Other Ambulatory Visit: Payer: Self-pay

## 2019-01-05 ENCOUNTER — Ambulatory Visit (HOSPITAL_COMMUNITY)
Admission: RE | Admit: 2019-01-05 | Discharge: 2019-01-05 | Disposition: A | Discharge status: Home / Self Care | Payer: Medicare HMO | Source: Ambulatory Visit | Attending: Interventional Radiology | Admitting: Interventional Radiology

## 2019-01-05 DIAGNOSIS — Z436 Encounter for attention to other artificial openings of urinary tract: Secondary | ICD-10-CM | POA: Diagnosis not present

## 2019-01-05 DIAGNOSIS — N135 Crossing vessel and stricture of ureter without hydronephrosis: Secondary | ICD-10-CM

## 2019-01-05 HISTORY — PX: IR NEPHROSTOMY EXCHANGE RIGHT: IMG6070

## 2019-01-05 MED ORDER — LIDOCAINE HCL 1 % IJ SOLN
INTRAMUSCULAR | Status: AC
  Filled 2019-01-05: qty 20

## 2019-01-05 MED ORDER — IOHEXOL 300 MG/ML  SOLN
50.0000 mL | Freq: Once | INTRAMUSCULAR | Status: AC | PRN
  Administered 2019-01-05: 8 mL

## 2019-01-05 NOTE — Procedures (Signed)
Interventional Radiology Procedure:   Indications: Routine exchange of nephrostomy.  History of bladder cancer.   Procedure:  Right nephrostomy tube exchange   Findings: 10 Fr drain in renal pelvis  Complications: None     EBL: None  Plan: Drain to gravity bag and will schedule routine drain change.   Monique Gift R. Anselm Pancoast, MD  Pager: [phone]

## 2019-01-07 ENCOUNTER — Other Ambulatory Visit (HOSPITAL_COMMUNITY): Payer: Medicare HMO

## 2019-01-28 ENCOUNTER — Ambulatory Visit (INDEPENDENT_AMBULATORY_CARE_PROVIDER_SITE_OTHER): Payer: Medicare HMO | Admitting: *Deleted

## 2019-01-28 DIAGNOSIS — I5033 Acute on chronic diastolic (congestive) heart failure: Secondary | ICD-10-CM

## 2019-01-28 DIAGNOSIS — R001 Bradycardia, unspecified: Secondary | ICD-10-CM

## 2019-01-28 LAB — CUP PACEART REMOTE DEVICE CHECK
Battery Impedance: 2698 Ohm
Battery Remaining Longevity: 23 mo
Battery Voltage: 2.72 V
Brady Statistic AP VP Percent: 83 %
Brady Statistic AP VS Percent: 12 %
Brady Statistic AS VP Percent: 4 %
Brady Statistic AS VS Percent: 1 %
Date Time Interrogation Session: 20201105133226
Implantable Lead Implant Date: 20090917
Implantable Lead Implant Date: 20090917
Implantable Lead Location: 753859
Implantable Lead Location: 753860
Implantable Lead Model: 5076
Implantable Lead Model: 5076
Implantable Pulse Generator Implant Date: 20090917
Lead Channel Impedance Value: 456 Ohm
Lead Channel Impedance Value: 520 Ohm
Lead Channel Pacing Threshold Amplitude: 0.375 V
Lead Channel Pacing Threshold Amplitude: 0.625 V
Lead Channel Pacing Threshold Pulse Width: 0.4 ms
Lead Channel Pacing Threshold Pulse Width: 0.4 ms
Lead Channel Setting Pacing Amplitude: 2 V
Lead Channel Setting Pacing Amplitude: 2.5 V
Lead Channel Setting Pacing Pulse Width: 0.4 ms
Lead Channel Setting Sensing Sensitivity: 2 mV

## 2019-02-01 DIAGNOSIS — K5900 Constipation, unspecified: Secondary | ICD-10-CM | POA: Diagnosis not present

## 2019-02-01 DIAGNOSIS — H3530 Unspecified macular degeneration: Secondary | ICD-10-CM | POA: Diagnosis not present

## 2019-02-01 DIAGNOSIS — Z7982 Long term (current) use of aspirin: Secondary | ICD-10-CM | POA: Diagnosis not present

## 2019-02-01 DIAGNOSIS — M109 Gout, unspecified: Secondary | ICD-10-CM | POA: Diagnosis not present

## 2019-02-01 DIAGNOSIS — N529 Male erectile dysfunction, unspecified: Secondary | ICD-10-CM | POA: Diagnosis not present

## 2019-02-01 DIAGNOSIS — Z809 Family history of malignant neoplasm, unspecified: Secondary | ICD-10-CM | POA: Diagnosis not present

## 2019-02-01 DIAGNOSIS — Z936 Other artificial openings of urinary tract status: Secondary | ICD-10-CM | POA: Diagnosis not present

## 2019-02-01 DIAGNOSIS — Z823 Family history of stroke: Secondary | ICD-10-CM | POA: Diagnosis not present

## 2019-02-01 DIAGNOSIS — I110 Hypertensive heart disease with heart failure: Secondary | ICD-10-CM | POA: Diagnosis not present

## 2019-02-01 DIAGNOSIS — I509 Heart failure, unspecified: Secondary | ICD-10-CM | POA: Diagnosis not present

## 2019-02-03 ENCOUNTER — Other Ambulatory Visit (HOSPITAL_COMMUNITY): Payer: Self-pay | Admitting: *Deleted

## 2019-02-04 ENCOUNTER — Ambulatory Visit (HOSPITAL_COMMUNITY)
Admission: RE | Admit: 2019-02-04 | Discharge: 2019-02-04 | Disposition: A | Discharge status: Home / Self Care | Payer: Medicare HMO | Source: Ambulatory Visit | Attending: Internal Medicine | Admitting: Internal Medicine

## 2019-02-04 ENCOUNTER — Other Ambulatory Visit: Payer: Self-pay

## 2019-02-04 DIAGNOSIS — M810 Age-related osteoporosis without current pathological fracture: Secondary | ICD-10-CM | POA: Diagnosis not present

## 2019-02-04 MED ORDER — DENOSUMAB 60 MG/ML ~~LOC~~ SOSY
PREFILLED_SYRINGE | SUBCUTANEOUS | Status: AC
  Filled 2019-02-04: qty 1

## 2019-02-04 MED ORDER — DENOSUMAB 60 MG/ML ~~LOC~~ SOSY
60.0000 mg | PREFILLED_SYRINGE | Freq: Once | SUBCUTANEOUS | Status: AC
  Administered 2019-02-04: 60 mg via SUBCUTANEOUS

## 2019-02-05 DIAGNOSIS — Z936 Other artificial openings of urinary tract status: Secondary | ICD-10-CM | POA: Diagnosis not present

## 2019-02-05 DIAGNOSIS — C679 Malignant neoplasm of bladder, unspecified: Secondary | ICD-10-CM | POA: Diagnosis not present

## 2019-02-11 NOTE — Progress Notes (Signed)
Remote pacemaker transmission.   

## 2019-02-23 ENCOUNTER — Other Ambulatory Visit: Payer: Self-pay

## 2019-02-23 ENCOUNTER — Ambulatory Visit (HOSPITAL_COMMUNITY)
Admission: RE | Admit: 2019-02-23 | Discharge: 2019-02-23 | Disposition: A | Discharge status: Home / Self Care | Payer: Medicare HMO | Source: Ambulatory Visit | Attending: Diagnostic Radiology | Admitting: Diagnostic Radiology

## 2019-02-23 ENCOUNTER — Other Ambulatory Visit (HOSPITAL_COMMUNITY): Payer: Self-pay | Admitting: Diagnostic Radiology

## 2019-02-23 ENCOUNTER — Encounter (HOSPITAL_COMMUNITY): Payer: Self-pay | Admitting: Interventional Radiology

## 2019-02-23 DIAGNOSIS — T8389XA Other specified complication of genitourinary prosthetic devices, implants and grafts, initial encounter: Secondary | ICD-10-CM | POA: Insufficient documentation

## 2019-02-23 DIAGNOSIS — N135 Crossing vessel and stricture of ureter without hydronephrosis: Secondary | ICD-10-CM

## 2019-02-23 DIAGNOSIS — C679 Malignant neoplasm of bladder, unspecified: Secondary | ICD-10-CM | POA: Diagnosis not present

## 2019-02-23 HISTORY — PX: IR NEPHROSTOMY PLACEMENT RIGHT: IMG6064

## 2019-02-23 MED ORDER — LIDOCAINE HCL 1 % IJ SOLN
INTRAMUSCULAR | Status: AC | PRN
  Administered 2019-02-23 (×2): 10 mL via INTRADERMAL

## 2019-02-23 MED ORDER — IOHEXOL 300 MG/ML  SOLN
50.0000 mL | Freq: Once | INTRAMUSCULAR | Status: AC | PRN
  Administered 2019-02-23: 25 mL

## 2019-02-23 MED ORDER — LIDOCAINE HCL 1 % IJ SOLN
INTRAMUSCULAR | Status: AC
  Filled 2019-02-23: qty 20

## 2019-02-23 NOTE — Procedures (Signed)
Pre Procedure Dx: Hydronephrosis Post Procedural Dx: Same  Successful ultrasound and fluoroscopic guided replacement of a new right-sided 10 French percutaneous nephrostomy catheter.   EBL: Minimal Complications: None immediate.  PLAN:  - The patient was given flushes instructed to flush the nephrostomy catheter with 10 cc of saline twice per day until the expected postprocedural hematuria resolves.   - Recommend repeat fluoroscopic guided exchange in 6 weeks.  At that time consideration for conversion of the right-sided nephrostomy catheter to a right-sided nephroureteral catheter (given apparent patency of the distal ureteral anastomosis) could be considered as indicated.    Ronny Bacon, MD Pager #: [phone]

## 2019-03-08 ENCOUNTER — Telehealth: Payer: Self-pay | Admitting: Cardiovascular Disease

## 2019-03-08 NOTE — Telephone Encounter (Signed)
Patients daughter, Golda Acre, calling to state that she needs to come with her father to his appt on Thursday with Dr. Angelena Form. States he is hard of hearing, legally blind and hard to understand.

## 2019-03-08 NOTE — Telephone Encounter (Signed)
Noted  

## 2019-03-11 ENCOUNTER — Other Ambulatory Visit: Payer: Self-pay

## 2019-03-11 ENCOUNTER — Encounter: Payer: Self-pay | Admitting: Cardiovascular Disease

## 2019-03-11 ENCOUNTER — Ambulatory Visit: Payer: Medicare HMO | Admitting: Cardiovascular Disease

## 2019-03-11 VITALS — BP 160/82 | HR 73 | Ht 67.5 in | Wt 149.0 lb

## 2019-03-11 DIAGNOSIS — I350 Nonrheumatic aortic (valve) stenosis: Secondary | ICD-10-CM

## 2019-03-11 NOTE — Patient Instructions (Signed)
Medication Instructions:  No changes *If you need a refill on your cardiac medications before your next appointment, please call your pharmacy*  Lab Work: none If you have labs (blood work) drawn today and your tests are completely normal, you will receive your results only by: . MyChart Message (if you have MyChart) OR . A paper copy in the mail If you have any lab test that is abnormal or we need to change your treatment, we will call you to review the results.  Testing/Procedures: none  Follow-Up: At [facility], you and your health needs are our priority.  As part of our continuing mission to provide you with exceptional heart care, we have created designated Provider Care Teams.  These Care Teams include your primary Cardiologist (physician) and Advanced Practice Providers (APPs -  Physician Assistants and Nurse Practitioners) who all work together to provide you with the care you need, when you need it.  Your next appointment:   6 month(s)  The format for your next appointment:   Either In Person or Virtual  Provider:   You may see Christopher McAlhany, MD or one of the following Advanced Practice Providers on your designated Care Team:    Dayna Dunn, PA-C  Michele Lenze, PA-C   Other Instructions   

## 2019-03-11 NOTE — Progress Notes (Signed)
Structural Heart Clinic Consult Note  Chief Complaint  Patient presents with  . Follow-up    Severe aortic stenosis    History of Present Illness: 85 yo male with history of diastolic and systolic CHF, bladder cancer, prostate cancer, DVT, HTN, stage 3 chronic kidney disease, symptomatic bradycardia s/p pacemaker and aortic stenosis who is here today for follow up in the structural heart clinic. I met him in September 2020 and we discussed his aortic stenosis. He has been followed in our office by Dr. Cristopher Peru. Pacemaker in place for symptomatic bradycardia. He has been known to have moderate aortic stenosis for at least the past five years. He was admitted to [facility] 12/09/18 with volume overload. Echo 12/10/18 severely reduced LV systolic function, EYCX=44-81%. Severe left atrial enlargement. The aortic valve leaflets are thickened and calcified with poor leaflet excursion. Mean gradient 29 mmHg, peak gradient 49 mmHg. AVA 0.67 cm2. Dimensionless index 0.26. He appears to have low flow, low gradient aortic stenosis. His most recent prior echo was in 2013 and at that time he had normal LV systolic function and mild to moderate aortic stenosis. When admitted in September 2020, he was diuresed and had symptomatic improvement. He has had many procedures on his bladder. He has had a cystectomy and has a urostomy bag in place. He also has a percutaneous drain into his right kidney. After discussion during his initial visit with me in September 2020, we elected to continue conservative management of his aortic stenosis given advanced age and presence of the drain into his right kidney which would increase his post operative infectious risk.   He is here today for follow up. The patient denies any chest pain, dyspnea, palpitations, lower extremity edema, orthopnea, PND, dizziness, near syncope or syncope. Weight is stable at home.   He lives alone. He does not go to the dentist but does not know of  any active dental issues. He is a retired Psychologist, sport and exercise. He still walks around his yard and feeds his cats. He is legally blind. He is here today with his daughter.   Primary Care Physician: Crist Infante, MD Primary Cardiologist: Cristopher Peru  Past Medical History:  Diagnosis Date  . Aortic valve stenosis    a. mild to moderate by echo 2013  . Bradycardia    MDT dual chamber pacemaker  . Cancer [facility])    bladder cancer  . Chronic kidney disease   . Deep venous thrombosis (HCC)    hx of  LEFT ARM AFTER PACEMAKER INSERTION ABOUT 6 YRS AGO  . Depression   . Gout   . Hypertension   . Kidney stone   . Macular degeneration    PT STATES HIS VISION STILL OKAY FOR DRIVING  . Osteoporosis   . Prostate cancer ([place])    AND BLADDER CANCER - S/P URETEROILEAL CONDUIT  . Renal disorder   . S/P ileal conduit [facility])         Past Surgical History:  Procedure Laterality Date  . 07/20/13  CONVERSION OF RIGHT SIDED NEPHROSTOMY CATHETER TO RIGHT SIDED NEPHRO URETERAL CATHETER - DONE IN INTERVENTIONAL RADIOLOGY    . APPENDECTOMY    . CATARACT EXTRACTION, BILATERAL    . CYSTECTOMY    . ilial conduit     for bladder cancer  . IR GENERIC HISTORICAL  10/26/2015   IR NEPHROSTOMY TUBE CHANGE 10/26/2015 Jacqulynn Cadet, MD [facility]-INTERV RAD  . IR GENERIC HISTORICAL  12/07/2015   IR NEPHROSTOMY TUBE CHANGE 12/07/2015  Greggory Keen, MD [facility]INTER[facility]  . IR GENERIC HISTORICAL  01/11/2016   IR NEPHROSTOMY TUBE CHANGE 01/11/2016 Greggory Keen, MD [facility]INTER[facility]  . IR GENERIC HISTORICAL  02/08/2016   IR NEPHROSTOMY TUBE CHANGE 02/08/2016 [facility]  . IR GENERIC HISTORICAL  03/14/2016   IR NEPHROSTOMY TUBE CHANGE 03/14/2016 Jacqulynn Cadet, MD [facility]INTER[facility]  . IR GENERIC HISTORICAL  04/22/2016   IR NEPHROSTOMY TUBE CHANGE 04/22/2016 Aletta Edouard, MD [facility]INTER[facility]  . IR GENERIC HISTORICAL  05/27/2016   IR NEPHROSTOMY TUBE CHANGE 05/27/2016 Arne Cleveland, MD [facility]INTERV [facility]  . IR NEPHROSTOMY EXCHANGE RIGHT  07/25/2017  . IR  NEPHROSTOMY EXCHANGE RIGHT  10/13/2017  . IR NEPHROSTOMY EXCHANGE RIGHT  11/18/2017  . IR NEPHROSTOMY EXCHANGE RIGHT  12/30/2017  . IR NEPHROSTOMY EXCHANGE RIGHT  02/10/2018  . IR NEPHROSTOMY EXCHANGE RIGHT  03/23/2018  . IR NEPHROSTOMY EXCHANGE RIGHT  05/04/2018  . IR NEPHROSTOMY EXCHANGE RIGHT  06/25/2018  . IR NEPHROSTOMY EXCHANGE RIGHT  08/13/2018  . IR NEPHROSTOMY EXCHANGE RIGHT  10/01/2018  . IR NEPHROSTOMY EXCHANGE RIGHT  11/19/2018  . IR NEPHROSTOMY EXCHANGE RIGHT  01/05/2019  . IR NEPHROSTOMY PLACEMENT RIGHT  06/13/2017  . IR NEPHROSTOMY PLACEMENT RIGHT  02/23/2019  . IR NEPHROSTOMY TUBE CHANGE  07/01/2016  . IR NEPHROSTOMY TUBE CHANGE  08/05/2016  . IR NEPHROSTOMY TUBE CHANGE  09/18/2016  . IR NEPHROSTOMY TUBE CHANGE  10/24/2016  . IR NEPHROSTOMY TUBE CHANGE  11/18/2016  . IR NEPHROSTOMY TUBE CHANGE  12/30/2016  . IR NEPHROSTOMY TUBE CHANGE  02/03/2017  . IR NEPHROSTOMY TUBE CHANGE  03/13/2017  . IR NEPHROSTOMY TUBE CHANGE  04/17/2017  . IR NEPHROSTOMY TUBE CHANGE  05/28/2017  . IR NEPHROSTOMY TUBE CHANGE  09/04/2017  . KIDNEY STONE SURGERY    . LYMPHADENECTOMY    . NEPHROLITHOTOMY  09/02/2011   Procedure: NEPHROLITHOTOMY PERCUTANEOUS;  Surgeon: Malka So, MD;  Location: [facility] ORS;  Service: Urology;  Laterality: Left;  . NEPHROLITHOTOMY Right 07/27/2013   Procedure: RIGHT PERCUTANEOUS NEPHROLITHOTOMY ;  Surgeon: Irine Seal, MD;  Location: [facility] ORS;  Service: Urology;  Laterality: Right;  . PACEMAKER INSERTION  2009   MDT dual chamber pacemaker implanted by Dr Verlon Setting  . PROSTATE SURGERY      Current Outpatient Medications  Medication Sig Dispense Refill  . acetaminophen (TYLENOL) 500 MG tablet Take 1,000 mg by mouth every 8 (eight) hours as needed for mild pain or headache.    Marland Kitchen aspirin EC 81 MG tablet Take 81 mg by mouth every morning.    Marland Kitchen Besifloxacin HCl (BESIVANCE) 0.6 % SUSP Apply 1 drop to eye See admin instructions. Only uses before eye shot he receives every 10 weeks.    .  Cholecalciferol (VITAMIN D-3) 1000 UNITS CAPS Take 1 capsule by mouth every morning.    . colchicine 0.6 MG tablet Take 0.6 mg by mouth as needed (gout).     Marland Kitchen docusate sodium (COLACE) 100 MG capsule Take 300 mg by mouth daily.     . furosemide (LASIX) 20 MG tablet Take 1 tablet (20 mg total) by mouth daily. 90 tablet 3  . Hyprom-Naphaz-Polysorb-Zn Sulf (CLEAR EYES COMPLETE OP) Apply 1-2 drops to eye daily as needed (dry eyes).    . metoprolol succinate (TOPROL-XL) 25 MG 24 hr tablet Take 1 tablet (25 mg total) by mouth daily. 30 tablet 0  . Multiple Vitamins-Minerals (VITEYES AREDS ADVANCED PO) Take 2 capsules by mouth daily.    . OMEGA-3 KRILL OIL PO  Take 1 tablet by mouth every morning.    . polyethylene glycol powder (GLYCOLAX/MIRALAX) 17 GM/SCOOP powder Take 17 g by mouth daily as needed for mild constipation. Takes 17grams (1 capful) as needed. 255 g 0  . ULORIC 40 MG tablet Take 1 tablet by mouth daily.     No current facility-administered medications for this visit.    No Known Allergies  Social History   Socioeconomic History  . Marital status: Widowed    Spouse name: Not on file  . Number of children: 3  . Years of education: Not on file  . Highest education level: Not on file  Occupational History  . Occupation: IT sales professional: RETIRED  Tobacco Use  . Smoking status: Former Smoker    Types: Cigarettes    Quit date: 03/25/1952    Years since quitting: 67.0  . Smokeless tobacco: Never Used  Substance and Sexual Activity  . Alcohol use: No  . Drug use: No  . Sexual activity: Never  Other Topics Concern  . Not on file  Social History Narrative   ** Merged History Encounter **       Social Determinants of Health   Financial Resource Strain:   . Difficulty of Paying Living Expenses: Not on file  Food Insecurity:   . Worried About Charity fundraiser in the Last Year: Not on file  . Ran Out of Food in the Last Year: Not on file  Transportation Needs:   . Lack  of Transportation (Medical): Not on file  . Lack of Transportation (Non-Medical): Not on file  Physical Activity:   . Days of Exercise per Week: Not on file  . Minutes of Exercise per Session: Not on file  Stress:   . Feeling of Stress : Not on file  Social Connections:   . Frequency of Communication with Friends and Family: Not on file  . Frequency of Social Gatherings with Friends and Family: Not on file  . Attends Religious Services: Not on file  . Active Member of Clubs or Organizations: Not on file  . Attends Archivist Meetings: Not on file  . Marital Status: Not on file  Intimate Partner Violence:   . Fear of Current or Ex-Partner: Not on file  . Emotionally Abused: Not on file  . Physically Abused: Not on file  . Sexually Abused: Not on file    Family History  Problem Relation Age of Onset  . Pancreatic cancer Father 75       died  . Pancreatic cancer Mother 18       died    Review of Systems:  As stated in the HPI and otherwise negative.   BP (!) 160/82   Pulse 73   Ht 5' 7.5" (1.715 m)   Wt 149 lb (67.6 kg)   SpO2 97%   BMI 22.99 kg/m   Physical Examination: General: Well developed, well nourished, NAD  HEENT: OP clear, mucus membranes moist  SKIN: warm, dry. No rashes. Neuro: No focal deficits  Musculoskeletal: Muscle strength 5/5 all ext  Psychiatric: Mood and affect normal  Neck: No JVD, no carotid bruits, no thyromegaly, no lymphadenopathy.  Lungs:Clear bilaterally, no wheezes, rhonci, crackles Cardiovascular: Regular rate and rhythm. Loud, harsh systolic murmur.  Abdomen:Soft. Bowel sounds present. Non-tender.  Extremities: No lower extremity edema. Pulses are 2 + in the bilateral DP/PT.  EKG:  EKG is not  ordered today. The ekg ordered today demonstrates   Echo  12/10/18:  1. Left ventricular ejection fraction, by visual estimation, is 20 to 25%. The left ventricle has severely decreased function. Mildly increased left ventricular size.  There is no left ventricular hypertrophy.  2. Multiple segmental abnormalities exist. See findings.  3. Left ventricular diastolic Doppler parameters are consistent with impaired relaxation pattern of LV diastolic filling.  4. Global right ventricle has normal systolic function.The right ventricular size is normal. No increase in right ventricular wall thickness.  5. Left atrial size was severely dilated.  6. Right atrial size was normal.  7. Large pleural effusion in the left lateral region.  8. Small pericardial effusion.  9. The mitral valve is normal in structure. Trace mitral valve regurgitation. No evidence of mitral stenosis. 10. The tricuspid valve is normal in structure. Tricuspid valve regurgitation is mild. 11. The aortic valve is normal in structure. Aortic valve regurgitation is mild by color flow Doppler. Moderate aortic valve stenosis. 12. The pulmonic valve was normal in structure. Pulmonic valve regurgitation is trivial by color flow Doppler. 13. Mildly elevated pulmonary artery systolic pressure. 14. A pacer wire is visualized. 15. The inferior vena cava is normal in size with greater than 50% respiratory variability, suggesting right atrial pressure of 3 mmHg.  FINDINGS  Left Ventricle: Left ventricular ejection fraction, by visual estimation, is 20 to 25%. The left ventricle has severely decreased function. There is no left ventricular hypertrophy. Mildly increased left ventricular size. Spectral Doppler shows Left  ventricular diastolic Doppler parameters are consistent with impaired relaxation pattern of LV diastolic filling. Global hypokinesis.    LV Wall Scoring: The entire inferior septum is akinetic. The entire anterior wall, entire inferior wall, basal and mid anterior septum, basal and mid inferolateral wall, and apex are hypokinetic.  Right Ventricle: The right ventricular size is normal. No increase in right ventricular wall thickness. Global RV systolic  function is has normal systolic function. The tricuspid regurgitant velocity is 2.51 m/s, and with an assumed right atrial pressure  of 8 mmHg, the estimated right ventricular systolic pressure is mildly elevated at 33.2 mmHg.  Left Atrium: Left atrial size was severely dilated.  Right Atrium: Right atrial size was normal in size  Pericardium: A small pericardial effusion is present. There is a large pleural effusion in the left lateral region.  Mitral Valve: The mitral valve is normal in structure. No evidence of mitral valve stenosis by observation. Trace mitral valve regurgitation.  Tricuspid Valve: The tricuspid valve is normal in structure. Tricuspid valve regurgitation is mild by color flow Doppler.  Aortic Valve: The aortic valve is normal in structure. Aortic valve regurgitation is mild by color flow Doppler. Aortic regurgitation PHT measures 797 msec. Moderate aortic stenosis is present. Aortic valve mean gradient measures 29.3 mmHg. Aortic valve  peak gradient measures 49.3 mmHg. Aortic valve area, by VTI measures 0.75 cm. Severely calcified and thickened aortic valve leaflets. Cannot rule out low flow, low gradient severe aortic stenosis.  Pulmonic Valve: The pulmonic valve was normal in structure. Pulmonic valve regurgitation is trivial by color flow Doppler.  Aorta: The aortic root, ascending aorta and aortic arch are all structurally normal, with no evidence of dilitation or obstruction.  Venous: The inferior vena cava was not well visualized. The inferior vena cava is normal in size with greater than 50% respiratory variability, suggesting right atrial pressure of 3 mmHg.  Shunts: No ventricular septal defect is seen or detected. There is no evidence of an atrial septal defect. No atrial level shunt  detected by color flow Doppler.  Additional Comments: A pacer wire is visualized.   LEFT VENTRICLE          Normals PLAX 2D LVIDd:         5.50 cm  3.6 cm LVIDs:          4.60 cm  1.7 cm LV PW:         0.70 cm  1.4 cm LV IVS:        0.70 cm  1.3 cm LVOT diam:     1.90 cm  2.0 cm LV SV:         50 ml    79 ml LV SV Index:   28.01    45 ml/m2 LVOT Area:     2.84 cm 3.14 cm2   LV Volumes (MOD)             Normals LV area d, A2C:    38.10 cm LV area d, A4C:    42.60 cm LV area s, A2C:    31.80 cm LV area s, A4C:    34.50 cm LV major d, A2C:   8.90 cm LV major d, A4C:   9.70 cm LV major s, A2C:   8.62 cm LV major s, A4C:   8.76 cm LV vol d, MOD A2C: 134.0 ml  68 ml LV vol d, MOD A4C: 151.0 ml LV vol s, MOD A2C: 101.0 ml  24 ml LV vol s, MOD A4C: 113.0 ml LV SV MOD A2C:     33.0 ml LV SV MOD A4C:     151.0 ml LV SV MOD BP:      41.1 ml   45 ml  RIGHT VENTRICLE TAPSE (M-mode): 0.9 cm  LEFT ATRIUM              Index LA diam:        3.20 cm  1.79 cm/m LA Vol (A2C):   108.0 ml 60.45 ml/m LA Vol (A4C):   55.1 ml  30.84 ml/m LA Biplane Vol: 78.7 ml  44.05 ml/m  AORTIC VALVE                    Normals AV Area (Vmax):    0.76 cm AV Area (Vmean):   0.67 cm     3.06 cm2 AV Area (VTI):     0.75 cm AV Vmax:           351.00 cm/s AV Vmean:          253.333 cm/s 77 cm/s AV VTI:            0.777 m      3.15 cm2 AV Peak Grad:      49.3 mmHg AV Mean Grad:      29.3 mmHg    3 mmHg LVOT Vmax:         94.60 cm/s LVOT Vmean:        59.600 cm/s  75 cm/s LVOT VTI:          0.205 m      25.3 cm LVOT/AV VTI ratio: 0.26         1 AI PHT:            797 msec   AORTA                 Normals Ao Root diam: 2.80 cm 31 mm  MR Peak grad: 129.0 mmHg  TRICUSPID VALVE  Normals MR Vmax:      568.00 cm/s TR Peak grad:   25.2 mmHg                           TR Vmax:        251.00 cm/s 288 cm/s                             SHUNTS                           Systemic VTI:  0.20 m                           Systemic Diam: 1.90 cm  Recent Labs: 12/09/2018: B Natriuretic Peptide 3,559.2 12/10/2018: ALT 14; Hemoglobin 12.0; Magnesium 2.4; Platelets  228; TSH 5.300 12/16/2018: BUN 58; Creatinine, Ser 2.19; Potassium 5.1; Sodium 143    Wt Readings from Last 3 Encounters:  03/11/19 149 lb (67.6 kg)  12/16/18 149 lb 6.4 oz (67.8 kg)  12/12/18 144 lb 1.6 oz (65.4 kg)     Other studies Reviewed: Additional studies/ records that were reviewed today include: Echo images, hospital notes, office notes.  Review of the above records demonstrates: Severe AS  STS Risk Score: Risk of Mortality: 9.260% Renal Failure: 27.616% Permanent Stroke: 1.886% Prolonged Ventilation: 28.777% DSW Infection: 0.132% Reoperation: 6.328% Morbidity or Mortality: 48.286% Short Length of Stay: 7.356% Long Length of Stay: 30.228%   Assessment and Plan:   1. Severe Aortic Valve Stenosis: He is felt to have low flow, low gradient severe aortic stenosis. He is doing well on Lasix. Overall feeling well. Weight is stable on Lasix 20 mg daily. He is not felt to be a good candidate for TAVR or surgical AVR given advanced age and indwelling drain into his right kidney which increases his infection risk if a valve were implanted.   I will see him back in 6 months.    Current medicines are reviewed at length with the patient today.  The patient does not have concerns regarding medicines.  The following changes have been made:  no change  Labs/ tests ordered today include:   No orders of the defined types were placed in this encounter.    Disposition:   FU with me in 6 months.    Signed, Lauree Chandler, MD 03/11/2019 2:49 PM    [address], [place], [place]  [address] Phone: [phone]; Fax: [phone]

## 2019-04-01 ENCOUNTER — Other Ambulatory Visit: Payer: Self-pay

## 2019-04-01 ENCOUNTER — Other Ambulatory Visit (HOSPITAL_COMMUNITY): Payer: Self-pay | Admitting: Interventional Radiology

## 2019-04-01 ENCOUNTER — Ambulatory Visit (HOSPITAL_COMMUNITY)
Admission: RE | Admit: 2019-04-01 | Discharge: 2019-04-01 | Disposition: A | Discharge status: Home / Self Care | Payer: Medicare HMO | Source: Ambulatory Visit | Attending: Diagnostic Radiology | Admitting: Diagnostic Radiology

## 2019-04-01 DIAGNOSIS — Z436 Encounter for attention to other artificial openings of urinary tract: Secondary | ICD-10-CM | POA: Insufficient documentation

## 2019-04-01 DIAGNOSIS — N135 Crossing vessel and stricture of ureter without hydronephrosis: Secondary | ICD-10-CM

## 2019-04-01 HISTORY — PX: IR NEPHROSTOMY EXCHANGE RIGHT: IMG6070

## 2019-04-01 IMAGING — CR DG ABDOMEN 1V
1 series · 1 of 1 positions shown · non-contrast
Comparison: None.

CLINICAL DATA: Initial evaluation for retained 2

EXAM:
ABDOMEN - 1 VIEW

[x abdomen supine]
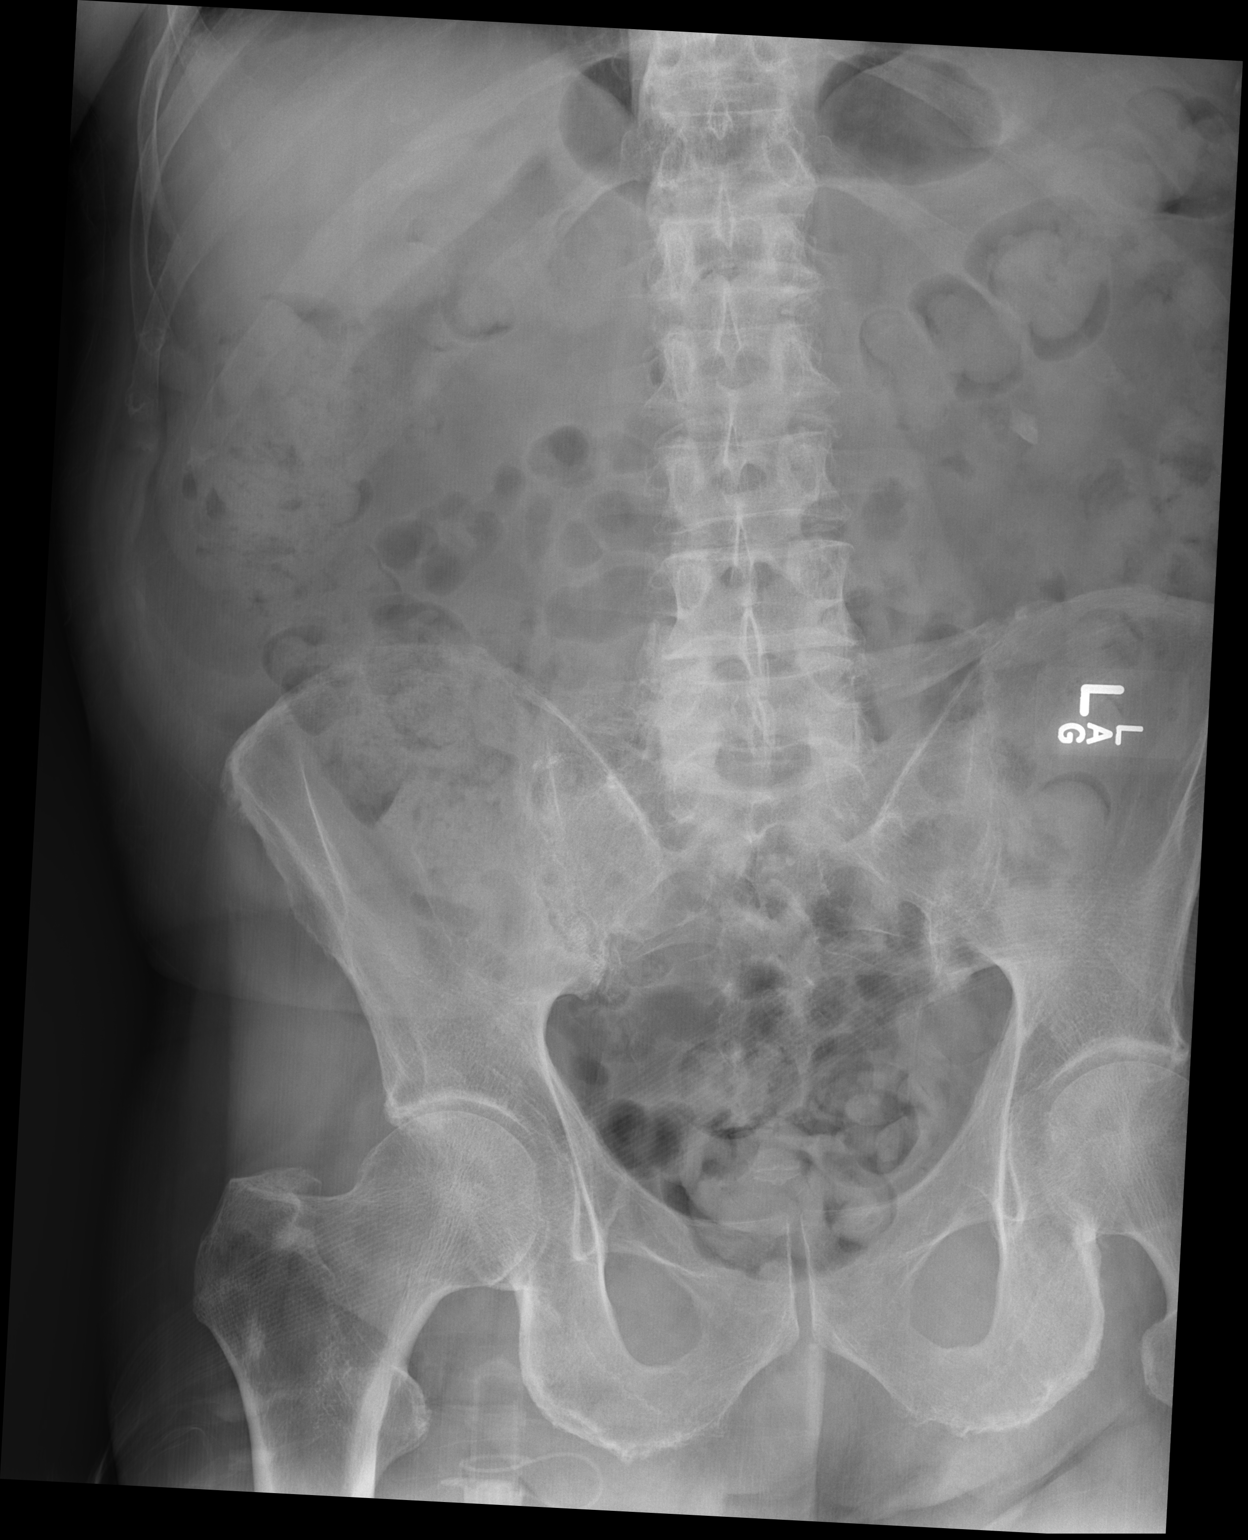

[1 of 1 positions shown; findings below may reference images not displayed]

FINDINGS: Bowel gas pattern within normal limits. 14 mm left renal calculus
noted. No other radiopaque calcification. No retained catheter or
other tube within the visualized abdomen. Partially visualized
hardware overlying the proximal right thigh likely lies external to
the patient.

Degenerate spondylolysis noted within lower lumbar spine.
IMPRESSION: 1. No retained tube or catheter within the visualized abdomen.
Hardware overlying the proximal right thigh favored to lie external
to the patient.
2. Left renal nephrolithiasis.
3. Nonobstructive bowel gas pattern.

## 2019-04-01 IMAGING — XA IR NEPHROSTOMY PLACEMENT RIGHT
2 series · 6 of 6 positions shown · non-contrast
Comparison: None.

INDICATION: [AGE] male with a history of bladder cancer status post
cystectomy with ureteral ileal diversion. He has a chronic stenosis
of the right ureteral enteric anastomosis and has had a retrograde
ureteral stent from the ostomy for many years. The stent
spontaneously became displace last night. It is not possible to
regain access from the retrograde approach. Therefore, he presents
for placement of a new percutaneous nephrostomy tube.

EXAM:
IR NEPHROSTOMY PLACEMENT RIGHT
TECHNIQUE: The procedure, risks, benefits, and alternatives were explained to
the patient. Questions regarding the procedure were encouraged and
answered. The patient understands and consents to the procedure.

[Series 1: fl - angio · 4 of 73 frames shown]
[frame 11/73]
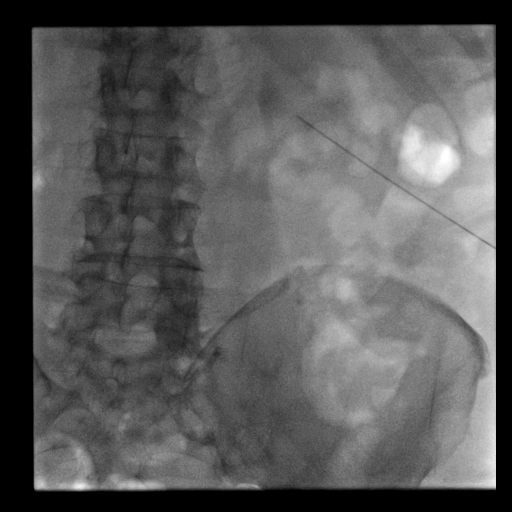
[frame 37/73]
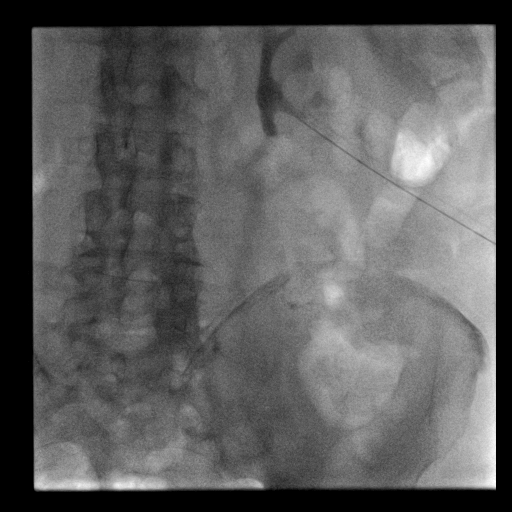
[frame 59/73]
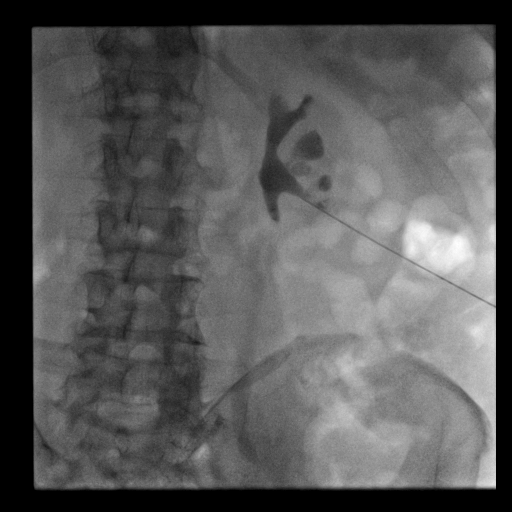
[frame 63/73]
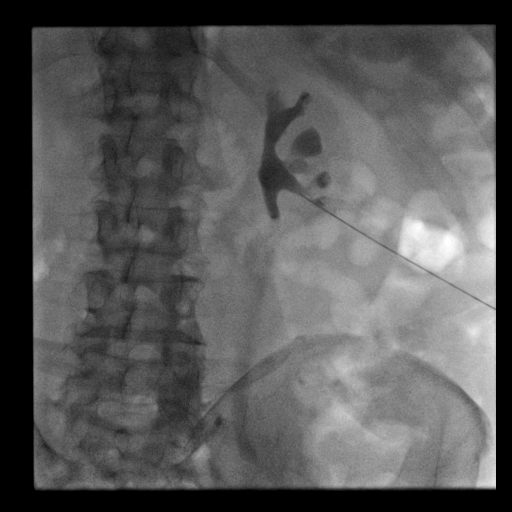

[Series 300: tube placements · 2 of 2 slices shown]
[im 1/2]
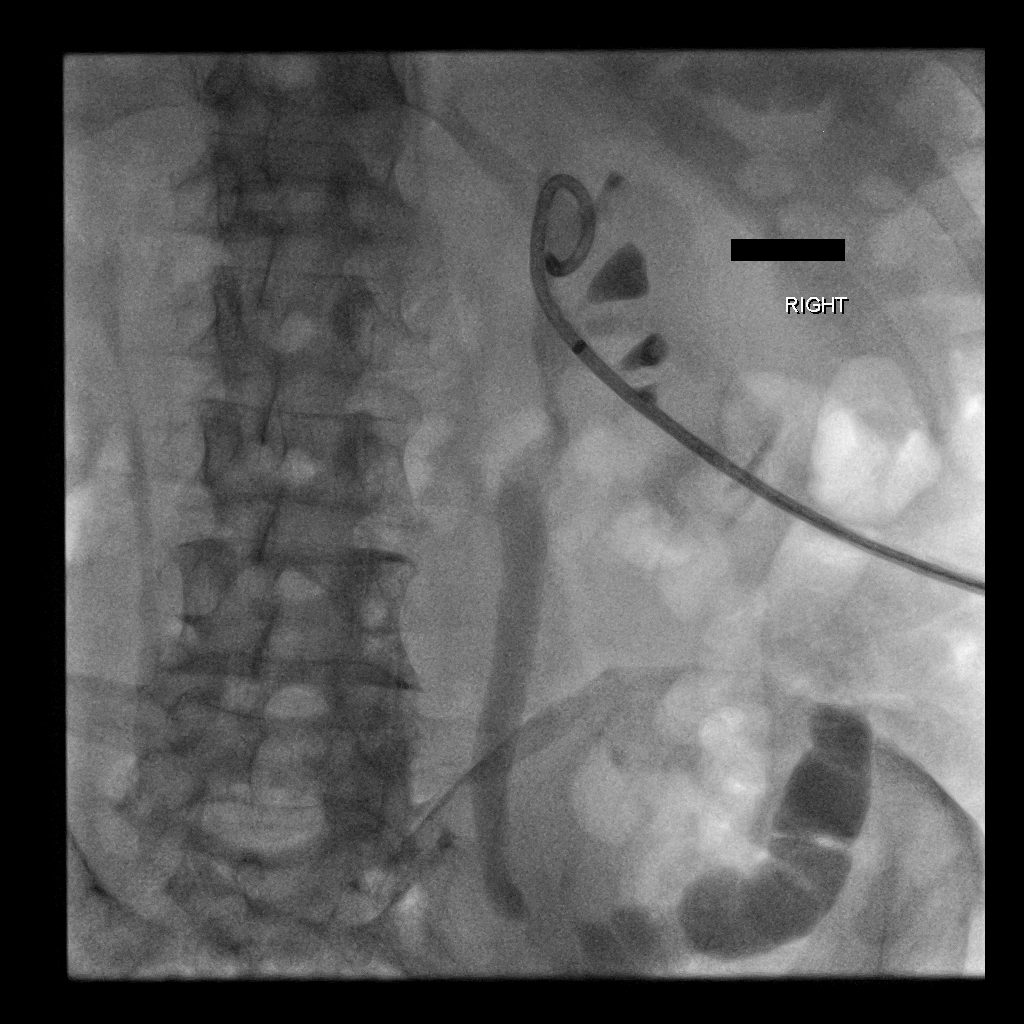
[im 2/2]
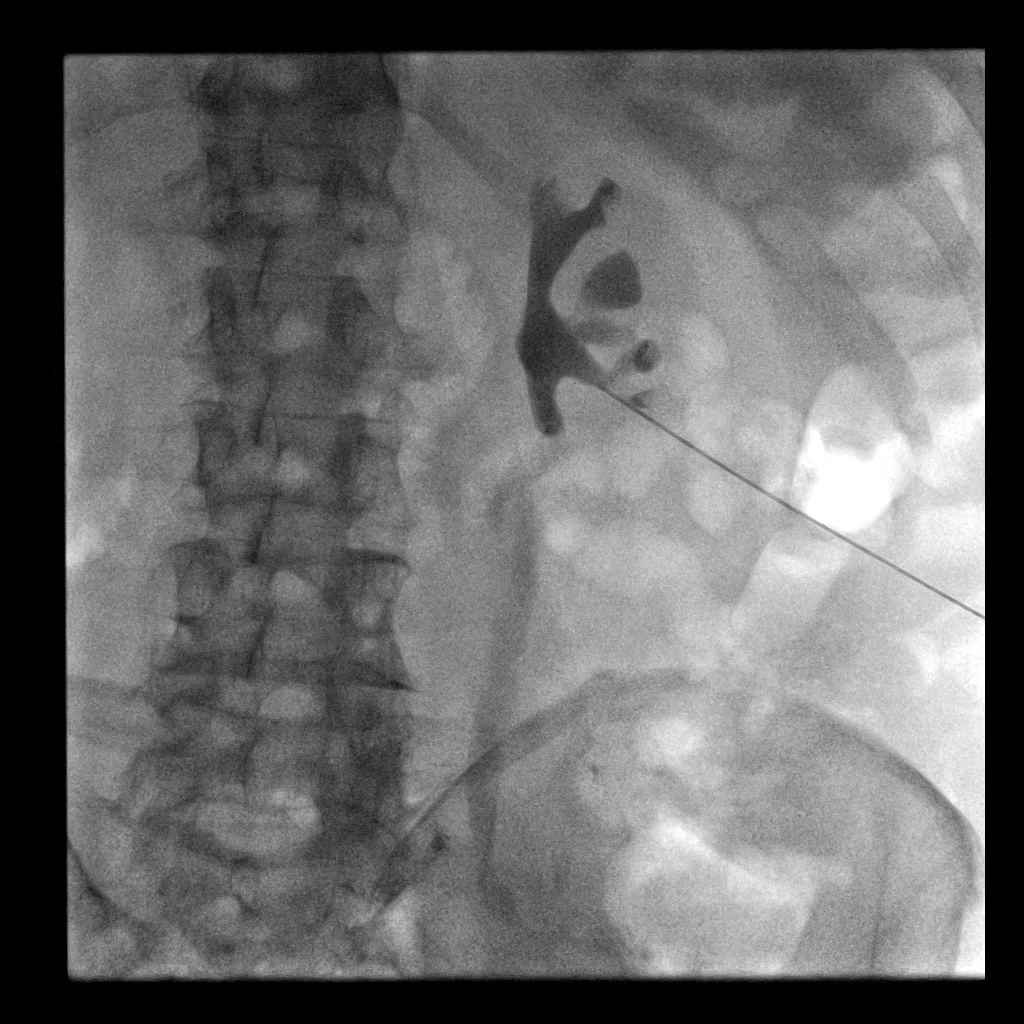

[6 of 6 positions shown; findings below may reference images not displayed]

MEDICATIONS:
2 g Ancef; The antibiotic was administered in an appropriate time
frame prior to skin puncture.

ANESTHESIA/SEDATION:
Fentanyl 50 mcg IV; Versed 1 mg IV

Moderate Sedation Time:  14 minutes

The patient was continuously monitored during the procedure by the
interventional radiology nurse under my direct supervision.

CONTRAST:  10mL [id number] IOPAMIDOL ([id number]-[id number]) INJECTION 61% -
administered into the collecting system(s)

FLUOROSCOPY TIME:  Fluoroscopy Time: 1 minutes 54 seconds (10 mGy).

COMPLICATIONS:
None immediate.
The right flank was prepped with chlorhexidine in a sterile fashion,
and a sterile drape was applied covering the operative field. A
sterile gown and sterile gloves were used for the procedure. Local
anesthesia was provided with 1% Lidocaine.

The right flank was interrogated with ultrasound and the left kidney
identified. The kidney is hydronephrotic. A suitable access site on
the skin overlying the lower pole, posterior calix was identified.
After local mg anesthesia was achieved, a small skin nick was made
with an 11 blade scalpel. A 21 gauge Accustick needle was then
advanced under direct sonographic guidance into the lower pole of
the right kidney. A 0.018 inch wire was advanced under fluoroscopic
guidance into the left renal collecting system. The Accustick sheath
was then advanced over the wire and a 0.018 system exchanged for a
0.035 system. Gentle hand injection of contrast material confirms
placement of the sheath within the renal collecting system. There is
mild hydronephrosis. Persistent distal ureteral occlusion. The tract
from the scan into the renal collecting system was then dilated
serially to 10-French. A 10-Bigone Samya was then
placed and positioned under fluoroscopic guidance. The locking loop
is well formed within the left renal pelvis. The catheter was
secured to the skin with 2-0 Prolene and a sterile bandage was
placed. Catheter was left to gravity bag drainage.
IMPRESSION: Successful placement of a right 10 percutaneous nephrostomy tube.

PLAN:
1. Return Interventional Radiology in 6 weeks for nephrostomy tube
check and exchange. Family is considering whether or not to keep
this as a chronic nephrostomy tube, or to undergo repeat conversion
to a retrograde nephroureteral tube.

## 2019-04-01 IMAGING — US US RENAL
1 series · 14 of 25 positions shown · non-contrast
Comparison: None.

CLINICAL DATA: Hydronephrosis due to ureteral stricture. History of
bladder cancer. Right nephrostomy fell out.

EXAM:
RENAL / URINARY TRACT ULTRASOUND COMPLETE

[Series 1: us renal · 14 of 68 slices shown]
[im 1/68]
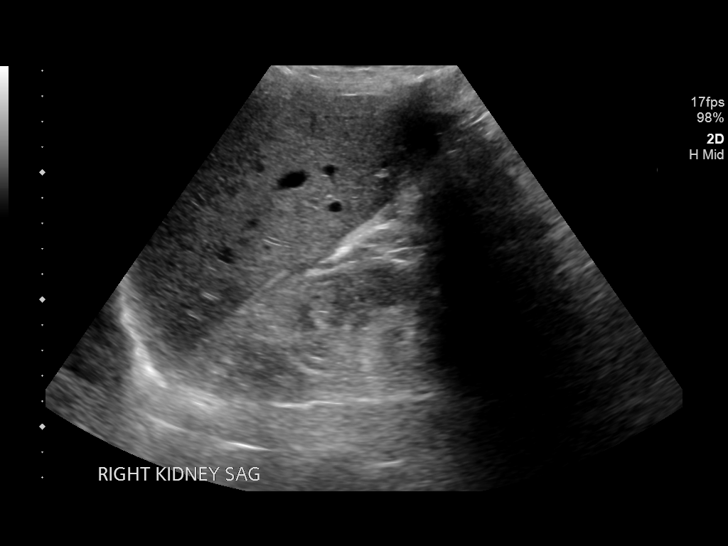
[im 6/68]
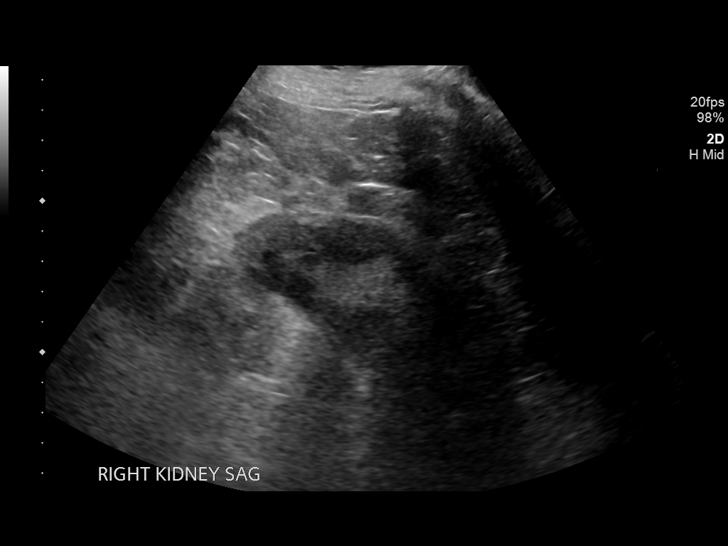
[im 12/68]
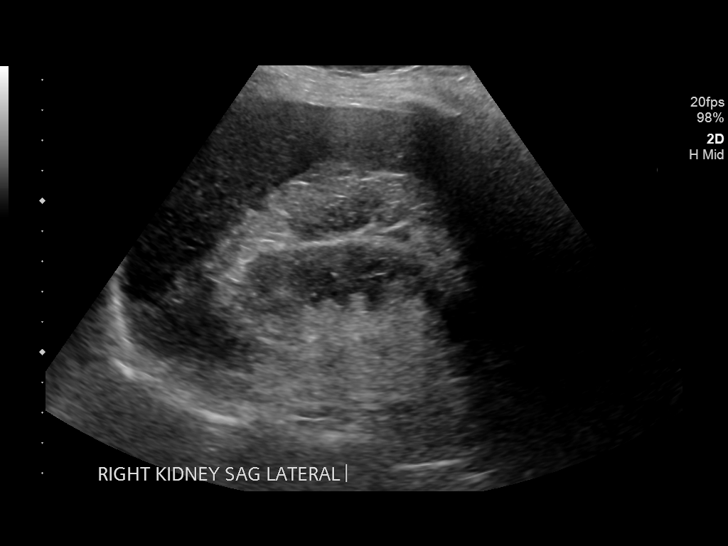
[im 17/68]
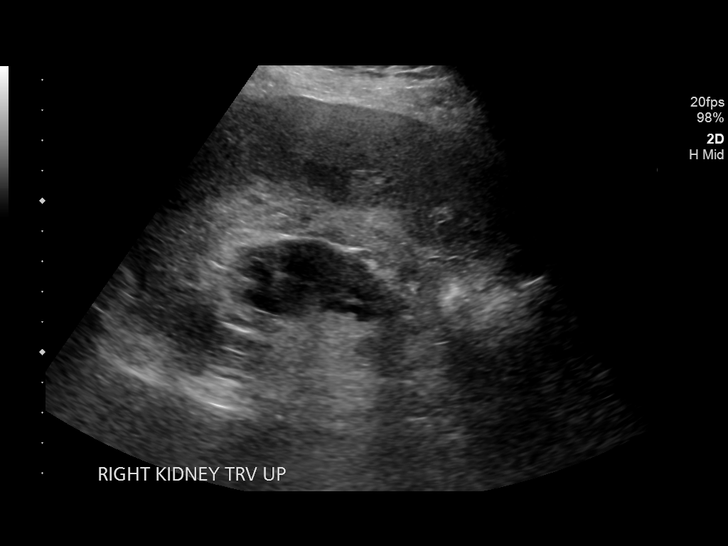
[im 23/68]
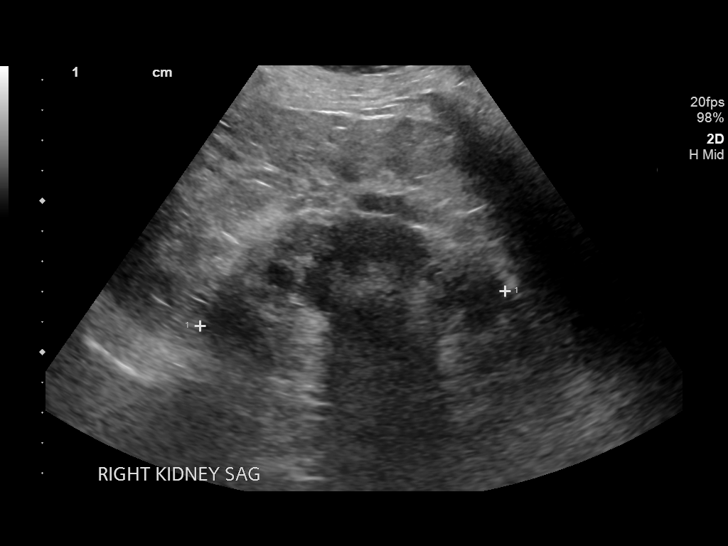
[im 26/68]
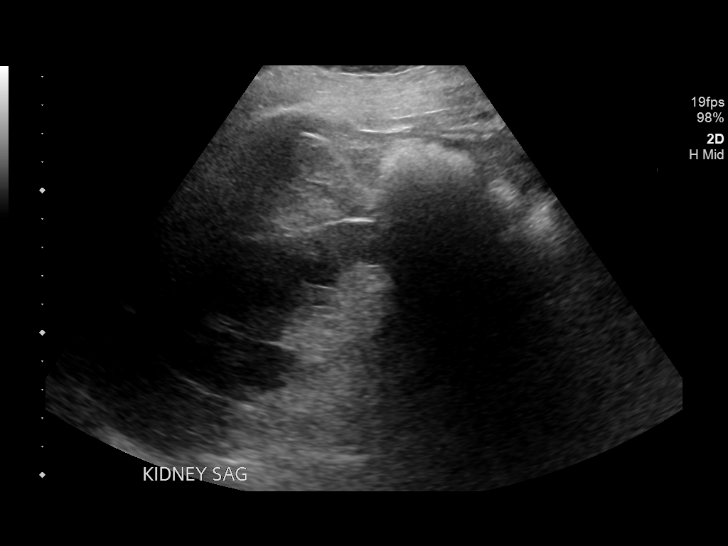
[im 31/68]
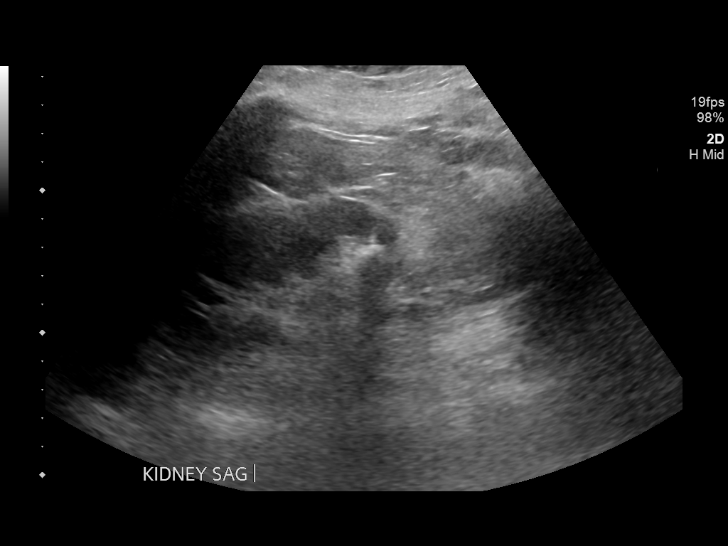
[im 37/68]
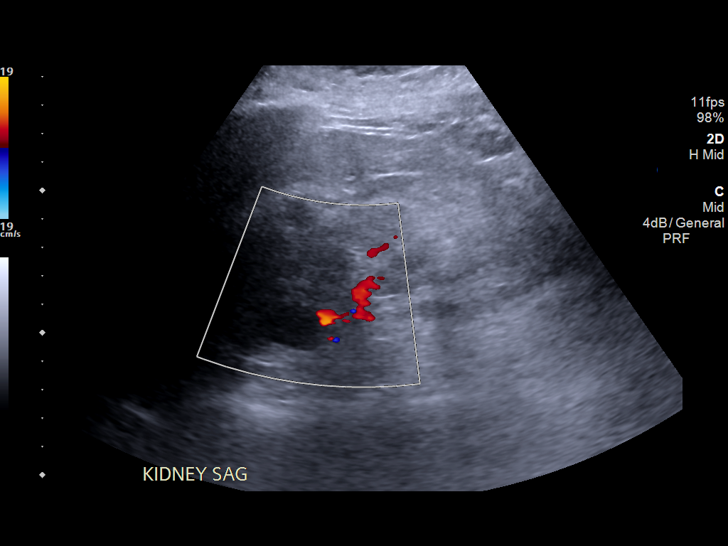
[im 42/68]
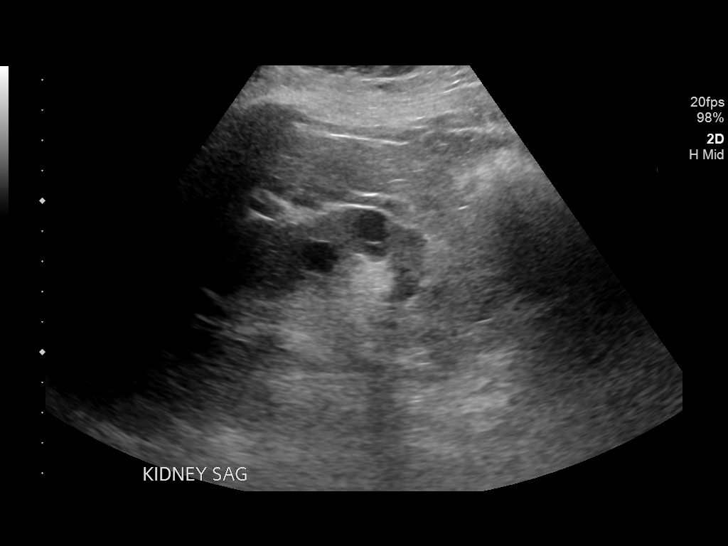
[im 45/68]
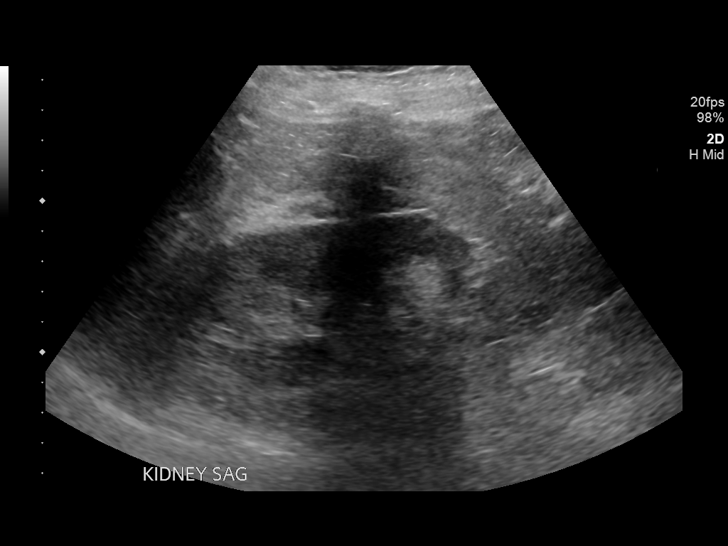
[im 51/68]
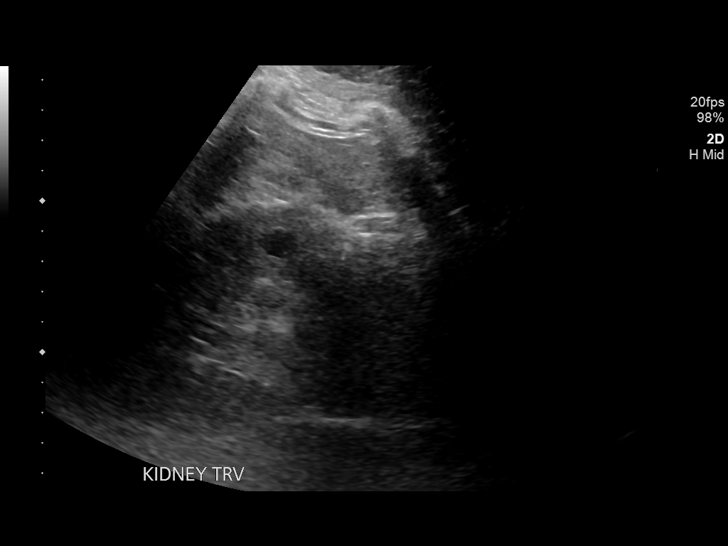
[im 56/68]
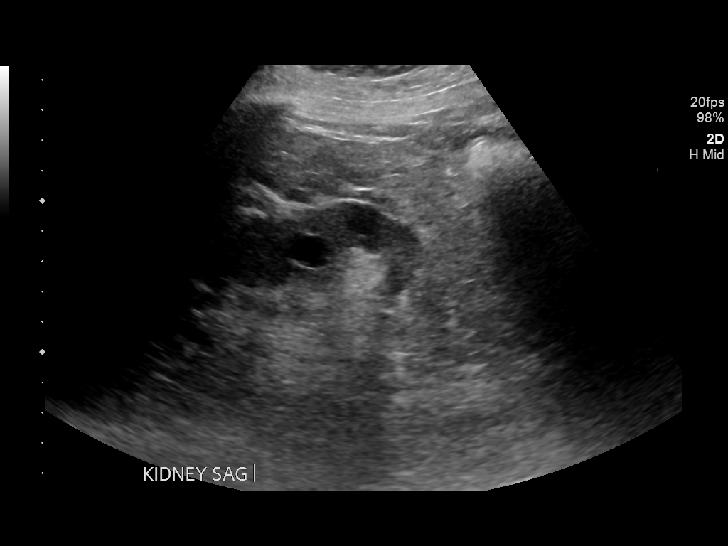
[im 62/68]
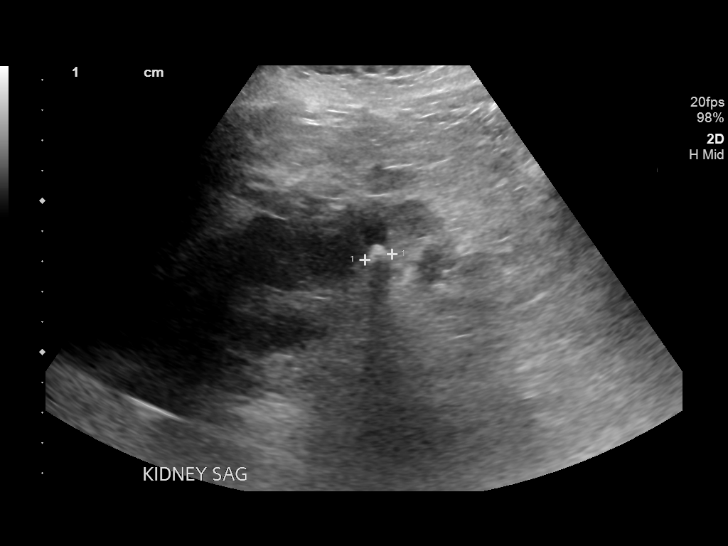
[im 68/68]
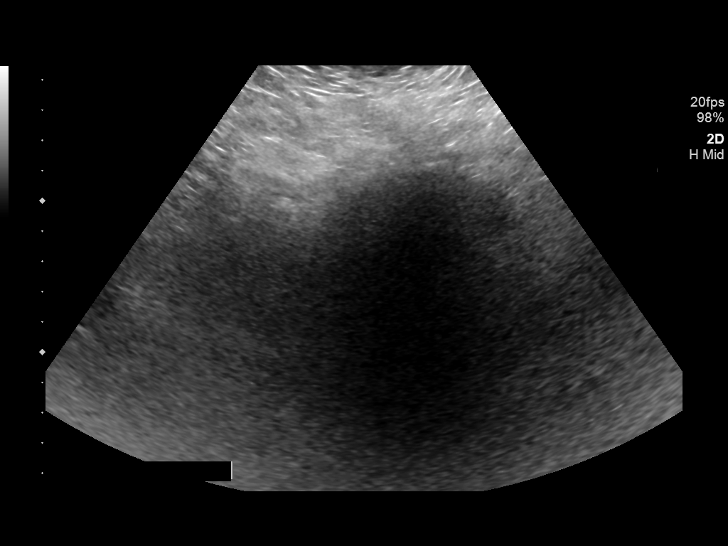

[14 of 25 positions shown; findings below may reference images not displayed]

FINDINGS: Right Kidney:

Length: 10.2 cm. Mild-to-moderate hydronephrosis. Suboptimal imaging
of the right kidney.

Left Kidney:

Length: 9.7 cm. Left upper pole cyst 2.4 x 1.8 cm. Left midpole cyst
1.4 x 1.3 cm. Complex cyst left lower pole 1.6 x 1.0 cm. Shadowing
stone left lower pole 9 mm.. Negative for hydronephrosis on the
left.

Bladder:

Surgically absent bladder
IMPRESSION: Mild to moderate right hydronephrosis

9 mm nonobstructing stone left lower pole.

## 2019-04-01 MED ORDER — LIDOCAINE HCL 1 % IJ SOLN
INTRAMUSCULAR | Status: AC
  Filled 2019-04-01: qty 20

## 2019-04-01 MED ORDER — IOHEXOL 300 MG/ML  SOLN
50.0000 mL | Freq: Once | INTRAMUSCULAR | Status: AC | PRN
  Administered 2019-04-01: 10 mL

## 2019-04-01 NOTE — Procedures (Signed)
Pre Procedure Dx: Hydronephrosis; Ureteral anastomotic stricture Post Procedure Dx: Same  Successful fluoroscopic guided exchange of right sided 10.2 French percutaneous nephrostomy catheter. Patency of the distal right ureteral anastomosis.  EBL: None No immediate complications.   PLAN:  Above findings discussed with the patient's daughter.  I explained that as the patient's ureteral anastomosis remains patent, urine may drain both internally as well as external to the gravity bag so she should not be alarmed if there is decreased output in the gravity bag as long as the patient denies flank pain and there is no pericatheter drainage.   Additionally, I explained that we could consider conversion of the patient's right-sided percutaneous nephrostomy catheter to a nephroureteral catheter, the benefits of which could include a trial of capping/internalization as well as providing a longer catheter with decreased risk of inadvertent removal of the catheter as occurred in early December.  My hesitation for performing conversion to a nephroureteral catheter is the patient's history of incrustation of previously existing catheters.   The patient's daughter stated she will discuss these options with her father of let us know at the time of the patient's next exchange in 4-6 weeks.    Ronny Bacon, MD Pager #: [phone]

## 2019-04-05 DIAGNOSIS — E7849 Other hyperlipidemia: Secondary | ICD-10-CM | POA: Diagnosis not present

## 2019-04-05 DIAGNOSIS — R946 Abnormal results of thyroid function studies: Secondary | ICD-10-CM | POA: Diagnosis not present

## 2019-04-05 DIAGNOSIS — I10 Essential (primary) hypertension: Secondary | ICD-10-CM | POA: Diagnosis not present

## 2019-04-05 DIAGNOSIS — E038 Other specified hypothyroidism: Secondary | ICD-10-CM | POA: Diagnosis not present

## 2019-04-06 ENCOUNTER — Other Ambulatory Visit (HOSPITAL_COMMUNITY): Payer: Medicare HMO

## 2019-04-14 ENCOUNTER — Encounter (INDEPENDENT_AMBULATORY_CARE_PROVIDER_SITE_OTHER): Payer: Medicare HMO | Admitting: Ophthalmology

## 2019-04-14 DIAGNOSIS — H353221 Exudative age-related macular degeneration, left eye, with active choroidal neovascularization: Secondary | ICD-10-CM | POA: Diagnosis not present

## 2019-04-14 DIAGNOSIS — I10 Essential (primary) hypertension: Secondary | ICD-10-CM | POA: Diagnosis not present

## 2019-04-14 DIAGNOSIS — H353114 Nonexudative age-related macular degeneration, right eye, advanced atrophic with subfoveal involvement: Secondary | ICD-10-CM

## 2019-04-14 DIAGNOSIS — H43813 Vitreous degeneration, bilateral: Secondary | ICD-10-CM | POA: Diagnosis not present

## 2019-04-14 DIAGNOSIS — H35033 Hypertensive retinopathy, bilateral: Secondary | ICD-10-CM

## 2019-04-19 DIAGNOSIS — R69 Illness, unspecified: Secondary | ICD-10-CM | POA: Diagnosis not present

## 2019-04-19 DIAGNOSIS — I509 Heart failure, unspecified: Secondary | ICD-10-CM | POA: Diagnosis not present

## 2019-04-19 DIAGNOSIS — I350 Nonrheumatic aortic (valve) stenosis: Secondary | ICD-10-CM | POA: Diagnosis not present

## 2019-04-19 DIAGNOSIS — C679 Malignant neoplasm of bladder, unspecified: Secondary | ICD-10-CM | POA: Diagnosis not present

## 2019-04-19 DIAGNOSIS — E039 Hypothyroidism, unspecified: Secondary | ICD-10-CM | POA: Diagnosis not present

## 2019-04-19 DIAGNOSIS — Z936 Other artificial openings of urinary tract status: Secondary | ICD-10-CM | POA: Diagnosis not present

## 2019-04-19 DIAGNOSIS — N184 Chronic kidney disease, stage 4 (severe): Secondary | ICD-10-CM | POA: Diagnosis not present

## 2019-04-19 DIAGNOSIS — I130 Hypertensive heart and chronic kidney disease with heart failure and stage 1 through stage 4 chronic kidney disease, or unspecified chronic kidney disease: Secondary | ICD-10-CM | POA: Diagnosis not present

## 2019-04-19 DIAGNOSIS — C61 Malignant neoplasm of prostate: Secondary | ICD-10-CM | POA: Diagnosis not present

## 2019-04-19 DIAGNOSIS — E875 Hyperkalemia: Secondary | ICD-10-CM | POA: Diagnosis not present

## 2019-04-25 ENCOUNTER — Emergency Department (HOSPITAL_COMMUNITY): Payer: Medicare HMO

## 2019-04-25 ENCOUNTER — Encounter (HOSPITAL_COMMUNITY): Payer: Self-pay | Admitting: Obstetrics and Gynecology

## 2019-04-25 ENCOUNTER — Other Ambulatory Visit: Payer: Self-pay

## 2019-04-25 ENCOUNTER — Inpatient Hospital Stay (HOSPITAL_COMMUNITY)
Admission: EM | Admit: 2019-04-25 | Discharge: 2019-05-05 | DRG: 871 | Disposition: A | Discharge status: Home Health | Payer: Medicare HMO | Attending: Internal Medicine | Admitting: Internal Medicine

## 2019-04-25 DIAGNOSIS — D62 Acute posthemorrhagic anemia: Secondary | ICD-10-CM | POA: Diagnosis not present

## 2019-04-25 DIAGNOSIS — Z950 Presence of cardiac pacemaker: Secondary | ICD-10-CM | POA: Diagnosis not present

## 2019-04-25 DIAGNOSIS — Z515 Encounter for palliative care: Secondary | ICD-10-CM | POA: Diagnosis not present

## 2019-04-25 DIAGNOSIS — A419 Sepsis, unspecified organism: Secondary | ICD-10-CM

## 2019-04-25 DIAGNOSIS — Z978 Presence of other specified devices: Secondary | ICD-10-CM | POA: Diagnosis not present

## 2019-04-25 DIAGNOSIS — R109 Unspecified abdominal pain: Secondary | ICD-10-CM

## 2019-04-25 DIAGNOSIS — C679 Malignant neoplasm of bladder, unspecified: Secondary | ICD-10-CM | POA: Diagnosis present

## 2019-04-25 DIAGNOSIS — K922 Gastrointestinal hemorrhage, unspecified: Secondary | ICD-10-CM | POA: Diagnosis not present

## 2019-04-25 DIAGNOSIS — K611 Rectal abscess: Secondary | ICD-10-CM | POA: Diagnosis not present

## 2019-04-25 DIAGNOSIS — J9811 Atelectasis: Secondary | ICD-10-CM | POA: Diagnosis not present

## 2019-04-25 DIAGNOSIS — R296 Repeated falls: Secondary | ICD-10-CM | POA: Diagnosis present

## 2019-04-25 DIAGNOSIS — Z8679 Personal history of other diseases of the circulatory system: Secondary | ICD-10-CM | POA: Diagnosis not present

## 2019-04-25 DIAGNOSIS — I429 Cardiomyopathy, unspecified: Secondary | ICD-10-CM | POA: Diagnosis present

## 2019-04-25 DIAGNOSIS — I10 Essential (primary) hypertension: Secondary | ICD-10-CM | POA: Diagnosis present

## 2019-04-25 DIAGNOSIS — Z20822 Contact with and (suspected) exposure to covid-19: Secondary | ICD-10-CM | POA: Diagnosis present

## 2019-04-25 DIAGNOSIS — I5043 Acute on chronic combined systolic (congestive) and diastolic (congestive) heart failure: Secondary | ICD-10-CM | POA: Diagnosis not present

## 2019-04-25 DIAGNOSIS — M109 Gout, unspecified: Secondary | ICD-10-CM | POA: Diagnosis present

## 2019-04-25 DIAGNOSIS — R7881 Bacteremia: Secondary | ICD-10-CM | POA: Diagnosis not present

## 2019-04-25 DIAGNOSIS — E039 Hypothyroidism, unspecified: Secondary | ICD-10-CM | POA: Diagnosis present

## 2019-04-25 DIAGNOSIS — K8030 Calculus of bile duct with cholangitis, unspecified, without obstruction: Secondary | ICD-10-CM | POA: Diagnosis present

## 2019-04-25 DIAGNOSIS — Z8546 Personal history of malignant neoplasm of prostate: Secondary | ICD-10-CM

## 2019-04-25 DIAGNOSIS — Z79890 Hormone replacement therapy (postmenopausal): Secondary | ICD-10-CM

## 2019-04-25 DIAGNOSIS — R011 Cardiac murmur, unspecified: Secondary | ICD-10-CM | POA: Diagnosis not present

## 2019-04-25 DIAGNOSIS — M810 Age-related osteoporosis without current pathological fracture: Secondary | ICD-10-CM | POA: Diagnosis present

## 2019-04-25 DIAGNOSIS — K269 Duodenal ulcer, unspecified as acute or chronic, without hemorrhage or perforation: Secondary | ICD-10-CM | POA: Diagnosis not present

## 2019-04-25 DIAGNOSIS — H9190 Unspecified hearing loss, unspecified ear: Secondary | ICD-10-CM | POA: Diagnosis present

## 2019-04-25 DIAGNOSIS — R0602 Shortness of breath: Secondary | ICD-10-CM | POA: Diagnosis not present

## 2019-04-25 DIAGNOSIS — I130 Hypertensive heart and chronic kidney disease with heart failure and stage 1 through stage 4 chronic kidney disease, or unspecified chronic kidney disease: Secondary | ICD-10-CM | POA: Diagnosis present

## 2019-04-25 DIAGNOSIS — H3530 Unspecified macular degeneration: Secondary | ICD-10-CM | POA: Diagnosis present

## 2019-04-25 DIAGNOSIS — Z8551 Personal history of malignant neoplasm of bladder: Secondary | ICD-10-CM

## 2019-04-25 DIAGNOSIS — I350 Nonrheumatic aortic (valve) stenosis: Secondary | ICD-10-CM | POA: Diagnosis not present

## 2019-04-25 DIAGNOSIS — B952 Enterococcus as the cause of diseases classified elsewhere: Secondary | ICD-10-CM | POA: Diagnosis not present

## 2019-04-25 DIAGNOSIS — I495 Sick sinus syndrome: Secondary | ICD-10-CM | POA: Diagnosis not present

## 2019-04-25 DIAGNOSIS — Z936 Other artificial openings of urinary tract status: Secondary | ICD-10-CM | POA: Diagnosis not present

## 2019-04-25 DIAGNOSIS — H548 Legal blindness, as defined in USA: Secondary | ICD-10-CM | POA: Diagnosis present

## 2019-04-25 DIAGNOSIS — K264 Chronic or unspecified duodenal ulcer with hemorrhage: Secondary | ICD-10-CM | POA: Diagnosis not present

## 2019-04-25 DIAGNOSIS — E8770 Fluid overload, unspecified: Secondary | ICD-10-CM | POA: Diagnosis not present

## 2019-04-25 DIAGNOSIS — N179 Acute kidney failure, unspecified: Secondary | ICD-10-CM | POA: Diagnosis not present

## 2019-04-25 DIAGNOSIS — K3189 Other diseases of stomach and duodenum: Secondary | ICD-10-CM | POA: Diagnosis not present

## 2019-04-25 DIAGNOSIS — R1011 Right upper quadrant pain: Secondary | ICD-10-CM | POA: Diagnosis not present

## 2019-04-25 DIAGNOSIS — K8050 Calculus of bile duct without cholangitis or cholecystitis without obstruction: Secondary | ICD-10-CM

## 2019-04-25 DIAGNOSIS — J9601 Acute respiratory failure with hypoxia: Secondary | ICD-10-CM | POA: Diagnosis not present

## 2019-04-25 DIAGNOSIS — Z86718 Personal history of other venous thrombosis and embolism: Secondary | ICD-10-CM

## 2019-04-25 DIAGNOSIS — K8070 Calculus of gallbladder and bile duct without cholecystitis without obstruction: Secondary | ICD-10-CM | POA: Diagnosis present

## 2019-04-25 DIAGNOSIS — Z87891 Personal history of nicotine dependence: Secondary | ICD-10-CM

## 2019-04-25 DIAGNOSIS — A4181 Sepsis due to Enterococcus: Secondary | ICD-10-CM | POA: Diagnosis not present

## 2019-04-25 DIAGNOSIS — K5791 Diverticulosis of intestine, part unspecified, without perforation or abscess with bleeding: Secondary | ICD-10-CM | POA: Diagnosis not present

## 2019-04-25 DIAGNOSIS — R945 Abnormal results of liver function studies: Secondary | ICD-10-CM

## 2019-04-25 DIAGNOSIS — R918 Other nonspecific abnormal finding of lung field: Secondary | ICD-10-CM | POA: Diagnosis not present

## 2019-04-25 DIAGNOSIS — Z87442 Personal history of urinary calculi: Secondary | ICD-10-CM

## 2019-04-25 DIAGNOSIS — R52 Pain, unspecified: Secondary | ICD-10-CM | POA: Diagnosis present

## 2019-04-25 DIAGNOSIS — E875 Hyperkalemia: Secondary | ICD-10-CM | POA: Diagnosis present

## 2019-04-25 DIAGNOSIS — N184 Chronic kidney disease, stage 4 (severe): Secondary | ICD-10-CM | POA: Diagnosis not present

## 2019-04-25 DIAGNOSIS — D539 Nutritional anemia, unspecified: Secondary | ICD-10-CM | POA: Diagnosis present

## 2019-04-25 DIAGNOSIS — I5033 Acute on chronic diastolic (congestive) heart failure: Secondary | ICD-10-CM | POA: Diagnosis present

## 2019-04-25 DIAGNOSIS — D500 Iron deficiency anemia secondary to blood loss (chronic): Secondary | ICD-10-CM | POA: Diagnosis not present

## 2019-04-25 DIAGNOSIS — K8020 Calculus of gallbladder without cholecystitis without obstruction: Secondary | ICD-10-CM | POA: Diagnosis not present

## 2019-04-25 DIAGNOSIS — R509 Fever, unspecified: Secondary | ICD-10-CM | POA: Diagnosis not present

## 2019-04-25 DIAGNOSIS — K8309 Other cholangitis: Secondary | ICD-10-CM | POA: Diagnosis not present

## 2019-04-25 DIAGNOSIS — Z7982 Long term (current) use of aspirin: Secondary | ICD-10-CM

## 2019-04-25 DIAGNOSIS — I5023 Acute on chronic systolic (congestive) heart failure: Secondary | ICD-10-CM | POA: Diagnosis not present

## 2019-04-25 DIAGNOSIS — K311 Adult hypertrophic pyloric stenosis: Secondary | ICD-10-CM | POA: Diagnosis present

## 2019-04-25 DIAGNOSIS — K921 Melena: Secondary | ICD-10-CM | POA: Diagnosis not present

## 2019-04-25 DIAGNOSIS — Z7189 Other specified counseling: Secondary | ICD-10-CM | POA: Diagnosis not present

## 2019-04-25 DIAGNOSIS — Z906 Acquired absence of other parts of urinary tract: Secondary | ICD-10-CM

## 2019-04-25 DIAGNOSIS — J90 Pleural effusion, not elsewhere classified: Secondary | ICD-10-CM | POA: Diagnosis not present

## 2019-04-25 DIAGNOSIS — K750 Abscess of liver: Secondary | ICD-10-CM | POA: Diagnosis not present

## 2019-04-25 DIAGNOSIS — Z87440 Personal history of urinary (tract) infections: Secondary | ICD-10-CM

## 2019-04-25 DIAGNOSIS — Z79899 Other long term (current) drug therapy: Secondary | ICD-10-CM

## 2019-04-25 DIAGNOSIS — R17 Unspecified jaundice: Secondary | ICD-10-CM | POA: Diagnosis not present

## 2019-04-25 DIAGNOSIS — R58 Hemorrhage, not elsewhere classified: Secondary | ICD-10-CM | POA: Diagnosis not present

## 2019-04-25 DIAGNOSIS — Z66 Do not resuscitate: Secondary | ICD-10-CM | POA: Diagnosis not present

## 2019-04-25 DIAGNOSIS — R911 Solitary pulmonary nodule: Secondary | ICD-10-CM | POA: Diagnosis not present

## 2019-04-25 DIAGNOSIS — N390 Urinary tract infection, site not specified: Secondary | ICD-10-CM | POA: Diagnosis present

## 2019-04-25 DIAGNOSIS — R748 Abnormal levels of other serum enzymes: Secondary | ICD-10-CM | POA: Diagnosis not present

## 2019-04-25 LAB — CBC WITH DIFFERENTIAL/PLATELET
Abs Immature Granulocytes: 0.05 10*3/uL (ref 0.00–0.07)
Basophils Absolute: 0 10*3/uL (ref 0.0–0.1)
Basophils Relative: 0 %
Eosinophils Absolute: 0 10*3/uL (ref 0.0–0.5)
Eosinophils Relative: 0 %
HCT: 36 % — ABNORMAL LOW (ref 39.0–52.0)
Hemoglobin: 11.5 g/dL — ABNORMAL LOW (ref 13.0–17.0)
Immature Granulocytes: 0 %
Lymphocytes Relative: 4 %
Lymphs Abs: 0.6 10*3/uL — ABNORMAL LOW (ref 0.7–4.0)
MCH: 32.9 pg (ref 26.0–34.0)
MCHC: 31.9 g/dL (ref 30.0–36.0)
MCV: 102.9 fL — ABNORMAL HIGH (ref 80.0–100.0)
Monocytes Absolute: 0.7 10*3/uL (ref 0.1–1.0)
Monocytes Relative: 5 %
Neutro Abs: 13.9 10*3/uL — ABNORMAL HIGH (ref 1.7–7.7)
Neutrophils Relative %: 91 %
Platelets: 231 10*3/uL (ref 150–400)
RBC: 3.5 MIL/uL — ABNORMAL LOW (ref 4.22–5.81)
RDW: 15.2 % (ref 11.5–15.5)
WBC: 15.2 10*3/uL — ABNORMAL HIGH (ref 4.0–10.5)
nRBC: 0 % (ref 0.0–0.2)

## 2019-04-25 LAB — BASIC METABOLIC PANEL
Anion gap: 8 (ref 5–15)
BUN: 43 mg/dL — ABNORMAL HIGH (ref 8–23)
CO2: 26 mmol/L (ref 22–32)
Calcium: 8.5 mg/dL — ABNORMAL LOW (ref 8.9–10.3)
Chloride: 105 mmol/L (ref 98–111)
Creatinine, Ser: 2.03 mg/dL — ABNORMAL HIGH (ref 0.61–1.24)
GFR calc Af Amer: 31 mL/min — ABNORMAL LOW (ref >60)
GFR calc non Af Amer: 27 mL/min — ABNORMAL LOW (ref >60)
Glucose, Bld: 98 mg/dL (ref 70–99)
Potassium: 4.9 mmol/L (ref 3.5–5.1)
Sodium: 139 mmol/L (ref 135–145)

## 2019-04-25 LAB — URINALYSIS, ROUTINE W REFLEX MICROSCOPIC
Bilirubin Urine: NEGATIVE
Bilirubin Urine: NEGATIVE
Glucose, UA: NEGATIVE mg/dL
Glucose, UA: NEGATIVE mg/dL
Hgb urine dipstick: NEGATIVE
Ketones, ur: NEGATIVE mg/dL
Ketones, ur: NEGATIVE mg/dL
Nitrite: NEGATIVE
Nitrite: POSITIVE — AB
Protein, ur: 100 mg/dL — AB
Protein, ur: >=300 mg/dL — AB
RBC / HPF: >50 RBC/hpf — ABNORMAL HIGH (ref 0–5)
Specific Gravity, Urine: 1.012 (ref 1.005–1.030)
Specific Gravity, Urine: 1.014 (ref 1.005–1.030)
WBC, UA: >50 WBC/hpf — ABNORMAL HIGH (ref 0–5)
pH: 6 (ref 5.0–8.0)
pH: 8 (ref 5.0–8.0)

## 2019-04-25 LAB — HEPATIC FUNCTION PANEL
ALT: 227 U/L — ABNORMAL HIGH (ref 0–44)
AST: 487 U/L — ABNORMAL HIGH (ref 15–41)
Albumin: 3.5 g/dL (ref 3.5–5.0)
Alkaline Phosphatase: 184 U/L — ABNORMAL HIGH (ref 38–126)
Bilirubin, Direct: 1.5 mg/dL — ABNORMAL HIGH (ref 0.0–0.2)
Indirect Bilirubin: 0.9 mg/dL (ref 0.3–0.9)
Total Bilirubin: 2.4 mg/dL — ABNORMAL HIGH (ref 0.3–1.2)
Total Protein: 7.5 g/dL (ref 6.5–8.1)

## 2019-04-25 LAB — LACTIC ACID, PLASMA
Lactic Acid, Venous: 1.6 mmol/L (ref 0.5–1.9)
Lactic Acid, Venous: 2.2 mmol/L (ref 0.5–1.9)

## 2019-04-25 LAB — RESPIRATORY PANEL BY RT PCR (FLU A&B, COVID)
Influenza A by PCR: NEGATIVE
Influenza B by PCR: NEGATIVE
SARS Coronavirus 2 by RT PCR: NEGATIVE

## 2019-04-25 LAB — TROPONIN I (HIGH SENSITIVITY): Troponin I (High Sensitivity): 49 ng/L — ABNORMAL HIGH (ref <18)

## 2019-04-25 LAB — POC SARS CORONAVIRUS 2 AG -  ED: SARS Coronavirus 2 Ag: NEGATIVE

## 2019-04-25 LAB — BRAIN NATRIURETIC PEPTIDE: B Natriuretic Peptide: 2136.1 pg/mL — ABNORMAL HIGH (ref 0.0–100.0)

## 2019-04-25 LAB — LIPASE, BLOOD: Lipase: 33 U/L (ref 11–51)

## 2019-04-25 MED ORDER — VANCOMYCIN HCL IN DEXTROSE 1-5 GM/200ML-% IV SOLN
1000.0000 mg | Freq: Once | INTRAVENOUS | Status: AC
  Administered 2019-04-25: 23:00:00 1000 mg via INTRAVENOUS
  Filled 2019-04-25: qty 200

## 2019-04-25 MED ORDER — LEVOTHYROXINE SODIUM 25 MCG PO TABS
25.0000 ug | ORAL_TABLET | Freq: Every day | ORAL | Status: DC
  Administered 2019-04-26 – 2019-05-05 (×7): 25 ug via ORAL
  Filled 2019-04-25 (×8): qty 1

## 2019-04-25 MED ORDER — TRAMADOL HCL 50 MG PO TABS
50.0000 mg | ORAL_TABLET | Freq: Two times a day (BID) | ORAL | Status: DC | PRN
  Administered 2019-04-30: 50 mg via ORAL
  Filled 2019-04-25: qty 1

## 2019-04-25 MED ORDER — POLYVINYL ALCOHOL 1.4 % OP SOLN
1.0000 [drp] | Freq: Every day | OPHTHALMIC | Status: DC | PRN
  Filled 2019-04-25: qty 15

## 2019-04-25 MED ORDER — PROSIGHT PO TABS
1.0000 | ORAL_TABLET | Freq: Every day | ORAL | Status: DC
  Administered 2019-04-26 – 2019-05-05 (×9): 1 via ORAL
  Filled 2019-04-25 (×9): qty 1

## 2019-04-25 MED ORDER — SODIUM CHLORIDE 0.9 % IV BOLUS
500.0000 mL | Freq: Once | INTRAVENOUS | Status: AC
  Administered 2019-04-25: 21:00:00 500 mL via INTRAVENOUS

## 2019-04-25 MED ORDER — METOPROLOL SUCCINATE ER 25 MG PO TB24
12.5000 mg | ORAL_TABLET | Freq: Every day | ORAL | Status: DC
  Administered 2019-04-26 – 2019-05-05 (×9): 12.5 mg via ORAL
  Filled 2019-04-25 (×9): qty 1

## 2019-04-25 MED ORDER — POLYETHYLENE GLYCOL 3350 17 GM/SCOOP PO POWD
17.0000 g | Freq: Every day | ORAL | Status: DC | PRN
  Filled 2019-04-25: qty 255

## 2019-04-25 MED ORDER — ACETAMINOPHEN 500 MG PO TABS
1000.0000 mg | ORAL_TABLET | Freq: Once | ORAL | Status: AC
  Administered 2019-04-25: 21:00:00 1000 mg via ORAL
  Filled 2019-04-25: qty 2

## 2019-04-25 MED ORDER — ONDANSETRON HCL 4 MG/2ML IJ SOLN
4.0000 mg | Freq: Four times a day (QID) | INTRAMUSCULAR | Status: DC | PRN
  Administered 2019-04-27: 4 mg via INTRAVENOUS
  Filled 2019-04-25: qty 2

## 2019-04-25 MED ORDER — PIPERACILLIN-TAZOBACTAM IN DEX 2-0.25 GM/50ML IV SOLN
2.2500 g | Freq: Four times a day (QID) | INTRAVENOUS | Status: DC
  Administered 2019-04-26 – 2019-04-30 (×16): 2.25 g via INTRAVENOUS
  Filled 2019-04-25 (×18): qty 50

## 2019-04-25 MED ORDER — ASPIRIN EC 81 MG PO TBEC
81.0000 mg | DELAYED_RELEASE_TABLET | Freq: Every day | ORAL | Status: DC
  Administered 2019-04-26 – 2019-04-28 (×2): 81 mg via ORAL
  Filled 2019-04-25 (×2): qty 1

## 2019-04-25 MED ORDER — SODIUM CHLORIDE 0.9 % IV SOLN: INTRAVENOUS | Status: AC

## 2019-04-25 MED ORDER — DOCUSATE SODIUM 100 MG PO CAPS
300.0000 mg | ORAL_CAPSULE | Freq: Every day | ORAL | Status: DC
  Administered 2019-04-26 – 2019-05-05 (×7): 300 mg via ORAL
  Filled 2019-04-25 (×8): qty 3

## 2019-04-25 MED ORDER — COLCHICINE 0.6 MG PO TABS: 0.6000 mg | ORAL_TABLET | ORAL | Status: DC | PRN

## 2019-04-25 MED ORDER — VITAMIN D3 25 MCG (1000 UNIT) PO TABS
1000.0000 [IU] | ORAL_TABLET | Freq: Every day | ORAL | Status: DC
  Administered 2019-04-26 – 2019-05-05 (×9): 1000 [IU] via ORAL
  Filled 2019-04-25 (×9): qty 1

## 2019-04-25 MED ORDER — POLYETHYLENE GLYCOL 3350 17 G PO PACK: 17.0000 g | PACK | Freq: Every day | ORAL | Status: DC | PRN

## 2019-04-25 MED ORDER — ENOXAPARIN SODIUM 30 MG/0.3ML ~~LOC~~ SOLN
30.0000 mg | Freq: Every day | SUBCUTANEOUS | Status: DC
  Administered 2019-04-25 – 2019-04-28 (×4): 30 mg via SUBCUTANEOUS
  Filled 2019-04-25 (×4): qty 0.3

## 2019-04-25 MED ORDER — PIPERACILLIN-TAZOBACTAM 3.375 G IVPB 30 MIN
3.3750 g | Freq: Once | INTRAVENOUS | Status: AC
  Administered 2019-04-25: 21:00:00 3.375 g via INTRAVENOUS
  Filled 2019-04-25: qty 50

## 2019-04-25 MED ORDER — FUROSEMIDE 20 MG PO TABS
20.0000 mg | ORAL_TABLET | ORAL | Status: DC
  Administered 2019-04-26 – 2019-04-28 (×2): 20 mg via ORAL
  Filled 2019-04-25 (×2): qty 1

## 2019-04-25 MED ORDER — VANCOMYCIN HCL IN DEXTROSE 1-5 GM/200ML-% IV SOLN: 1000.0000 mg | INTRAVENOUS | Status: DC

## 2019-04-25 MED ORDER — FEBUXOSTAT 40 MG PO TABS
40.0000 mg | ORAL_TABLET | Freq: Every day | ORAL | Status: DC
  Administered 2019-04-26 – 2019-05-05 (×9): 40 mg via ORAL
  Filled 2019-04-25 (×10): qty 1

## 2019-04-25 MED ORDER — SODIUM CHLORIDE 0.9 % IV SOLN: INTRAVENOUS | Status: DC

## 2019-04-25 NOTE — Progress Notes (Signed)
CRITICAL VALUE ALERT  Critical Value:  Lactic acid 2.2  Date & Time Notied:  04/25/19  Provider Notified: Kennon Holter, NP  Orders Received/Actions taken: Awaiting new orders

## 2019-04-25 NOTE — H&P (Signed)
TRH H&P    Patient Demographics:    [name] [name], is a 86 y.o. male  MRN: [id number]  DOB - 27-Dec-1921  Admit Date - 04/25/2019  Referring MD/NP/PA:   Arlean Hopping  Outpatient Primary MD for the patient is Crist Infante, MD  Patient coming from: home  Chief complaint- left flank pain   HPI:    [name] [name]  is a 86 y.o. male, w hypertension, aortic stenosis, CHF (EF 25%),  H/o bradycardia s/p pacer, h/o prostate cancer, h/o bladder cancer s/p cystectomy/ ileal conduit , right nephrostomy presents with c/o left flank pain starting last nite.  Pt notes darker urine in his bag.  No fever til got to ER.  Slight sob, taking lasix every other day.  Denies cough, cp, palp, n/v, diarrhea, brbpr, black stool. Weight stable per patient.   In ED, T 97.2, T 100.5, P 108 R 29, Bp 169/58,  Pox 94% on RA,  Wt 68.9kg  CT  IMPRESSION: 1. No specific abnormality to explain the patient's left flank pain. There is no left-sided hydronephrosis. There is a 1.4 cm nonobstructing stone in lower pole the left kidney. 2. Moderate to large right-sided pleural effusion. Small left-sided pleural effusion. 3. Cholelithiasis with CT evidence for choledocholithiasis. Multiple stones are noted in the common bile duct of both proximally and distally. The common bile duct is dilated and there is mild intrahepatic biliary ductal dilatation. There is no CT evidence for acute calculus cholecystitis. 4. Right-sided percutaneous nephrostomy tube in place. While the tube appears slightly tortuous and possibly kinked, the tube appears to be functioning appropriately as there is no right-sided hydronephrosis. 5. Findings concerning for 1.9 cm mass in the right middle lobe. Follow-up with a nonemergent contrast enhanced CT of the chest is recommended for further evaluation of this finding.  6. Multiple additional chronic findings as detailed  above.  RUQ ultrasound IMPRESSION: 1. Dilated intra- and extrahepatic biliary tree due to the multifocal Choledocholithiasis demonstrated by CT today. 2. Cholelithiasis, no superimposed acute cholecystitis. 3. Right pleural effusion redemonstrated. Wbc 15.2, Hgb 11.5, Plt 231 Na 139, K 4.9, Bun 43, Creatinine 2.03 Ast 487, Alt 227, Alk phos 184, T. Bili 2.4  Urinalysis wbc 21-50, Rbc 0-5  Covid-19 pcr negative  [facility]) consulted by ED per ED   Pt will be admitted for sepsis secondary to UTI vs abnormal liver function (cholelithiasis).      Review of systems:    In addition to the HPI above,  No Fever-chills, No Headache, No changes with Vision or hearing, No problems swallowing food or Liquids, No Chest pain, Cough  No Abdominal pain, No Nausea or Vomiting, bowel movements are regular, No Blood in stool or Urine, No dysuria, No new skin rashes or bruises, No new joints pains-aches,  No new weakness, tingling, numbness in any extremity, No recent weight gain or loss, No polyuria, polydypsia or polyphagia, No significant Mental Stressors.  All other systems reviewed and are negative.    Past History of the following :  Past Medical History:  Diagnosis Date  . Aortic valve stenosis    a. mild to moderate by echo 2013  . Bradycardia    MDT dual chamber pacemaker  . Cancer [facility])    bladder cancer  . Chronic kidney disease   . Deep venous thrombosis (HCC)    hx of  LEFT ARM AFTER PACEMAKER INSERTION ABOUT 6 YRS AGO  . Depression   . Gout   . Hypertension   . Kidney stone   . Macular degeneration    PT STATES HIS VISION STILL OKAY FOR DRIVING  . Osteoporosis   . Prostate cancer ([place])    AND BLADDER CANCER - S/P URETEROILEAL CONDUIT  . Renal disorder   . S/P ileal conduit [facility])           Past Surgical History:  Procedure Laterality Date  . 07/20/13  CONVERSION OF RIGHT SIDED NEPHROSTOMY CATHETER TO RIGHT SIDED NEPHRO URETERAL CATHETER -  DONE IN INTERVENTIONAL RADIOLOGY    . APPENDECTOMY    . CATARACT EXTRACTION, BILATERAL    . CYSTECTOMY    . ilial conduit     for bladder cancer  . IR GENERIC HISTORICAL  10/26/2015   IR NEPHROSTOMY TUBE CHANGE 10/26/2015 Jacqulynn Cadet, MD [facility]-INTERV RAD  . IR GENERIC HISTORICAL  12/07/2015   IR NEPHROSTOMY TUBE CHANGE 12/07/2015 Greggory Keen, MD [facility]  . IR GENERIC HISTORICAL  01/11/2016   IR NEPHROSTOMY TUBE CHANGE 01/11/2016 Greggory Keen, MD [facility]  . IR GENERIC HISTORICAL  02/08/2016   IR NEPHROSTOMY TUBE CHANGE 02/08/2016 [facility]  . IR GENERIC HISTORICAL  03/14/2016   IR NEPHROSTOMY TUBE CHANGE 03/14/2016 Jacqulynn Cadet, MD [facility]  . IR GENERIC HISTORICAL  04/22/2016   IR NEPHROSTOMY TUBE CHANGE 04/22/2016 Aletta Edouard, MD [facility]  . IR GENERIC HISTORICAL  05/27/2016   IR NEPHROSTOMY TUBE CHANGE 05/27/2016 Arne Cleveland, MD [facility]  . IR NEPHROSTOMY EXCHANGE RIGHT  07/25/2017  . IR NEPHROSTOMY EXCHANGE RIGHT  10/13/2017  . IR NEPHROSTOMY EXCHANGE RIGHT  11/18/2017  . IR NEPHROSTOMY EXCHANGE RIGHT  12/30/2017  . IR NEPHROSTOMY EXCHANGE RIGHT  02/10/2018  . IR NEPHROSTOMY EXCHANGE RIGHT  03/23/2018  . IR NEPHROSTOMY EXCHANGE RIGHT  05/04/2018  . IR NEPHROSTOMY EXCHANGE RIGHT  06/25/2018  . IR NEPHROSTOMY EXCHANGE RIGHT  08/13/2018  . IR NEPHROSTOMY EXCHANGE RIGHT  10/01/2018  . IR NEPHROSTOMY EXCHANGE RIGHT  11/19/2018  . IR NEPHROSTOMY EXCHANGE RIGHT  01/05/2019  . IR NEPHROSTOMY EXCHANGE RIGHT  04/01/2019  . IR NEPHROSTOMY PLACEMENT RIGHT  06/13/2017  . IR NEPHROSTOMY PLACEMENT RIGHT  02/23/2019  . IR NEPHROSTOMY TUBE CHANGE  07/01/2016  . IR NEPHROSTOMY TUBE CHANGE  08/05/2016  . IR NEPHROSTOMY TUBE CHANGE  09/18/2016  . IR NEPHROSTOMY TUBE CHANGE  10/24/2016  . IR NEPHROSTOMY TUBE CHANGE  11/18/2016  . IR NEPHROSTOMY TUBE CHANGE  12/30/2016  . IR NEPHROSTOMY TUBE CHANGE  02/03/2017  . IR NEPHROSTOMY TUBE CHANGE  03/13/2017  . IR NEPHROSTOMY TUBE CHANGE   04/17/2017  . IR NEPHROSTOMY TUBE CHANGE  05/28/2017  . IR NEPHROSTOMY TUBE CHANGE  09/04/2017  . KIDNEY STONE SURGERY    . LYMPHADENECTOMY    . NEPHROLITHOTOMY  09/02/2011   Procedure: NEPHROLITHOTOMY PERCUTANEOUS;  Surgeon: Malka So, MD;  Location: [facility];  Service: Urology;  Laterality: Left;  . NEPHROLITHOTOMY Right 07/27/2013   Procedure: RIGHT PERCUTANEOUS NEPHROLITHOTOMY ;  Surgeon: Irine Seal, MD;  Location: [facility];  Service: Urology;  Laterality: Right;  . PACEMAKER INSERTION  2009   MDT dual chamber pacemaker implanted by Dr Verlon Setting  . PROSTATE SURGERY        Social History:      Social History   Tobacco Use  . Smoking status: Former Smoker    Types: Cigarettes    Quit date: 03/25/1952    Years since quitting: 67.1  . Smokeless tobacco: Never Used  Substance Use Topics  . Alcohol use: No       Family History :     Family History  Problem Relation Age of Onset  . Pancreatic cancer Father 42       died  . Pancreatic cancer Mother 73       died       Home Medications:   Prior to Admission medications   Medication Sig Start Date End Date Taking? Authorizing Provider  acetaminophen (TYLENOL) 500 MG tablet Take 1,000 mg by mouth every 8 (eight) hours as needed for mild pain or headache.   Yes [provider]  aspirin EC 81 MG tablet Take 81 mg by mouth every morning.   Yes [provider]  Besifloxacin HCl (BESIVANCE) 0.6 % SUSP Apply 1 drop to eye See admin instructions. Only uses before eye shot he receives every 10 weeks.   Yes [provider]  Cholecalciferol (VITAMIN D-3) 1000 UNITS CAPS Take 1 capsule by mouth every morning.   Yes [provider]  colchicine 0.6 MG tablet Take 0.6 mg by mouth as needed (gout).    Yes [provider]  docusate sodium (COLACE) 100 MG capsule Take 300 mg by mouth daily.    Yes [provider]  furosemide (LASIX) 20 MG tablet Take 1 tablet (20 mg total) by mouth daily. 12/16/18  12/11/19 Yes Burnell Blanks, MD  Hyprom-Naphaz-Polysorb-Zn Sulf (CLEAR EYES COMPLETE OP) Apply 1-2 drops to eye daily as needed (dry eyes).   Yes [provider]  levothyroxine (SYNTHROID) 25 MCG tablet Take 25 mcg by mouth every morning. 04/07/19  Yes [provider]  metoprolol succinate (TOPROL-XL) 12.5 mg TB24 24 hr tablet Take 12.5 mg by mouth daily.   Yes [provider]  Multiple Vitamins-Minerals (VITEYES AREDS ADVANCED PO) Take 2 capsules by mouth daily.   Yes [provider]  OMEGA-3 KRILL OIL PO Take 1 tablet by mouth every morning.   Yes [provider]  polyethylene glycol powder (GLYCOLAX/MIRALAX) 17 GM/SCOOP powder Take 17 g by mouth daily as needed for mild constipation. Takes 17grams (1 capful) as needed. 12/12/18  Yes Mariel Aloe, MD  ULORIC 40 MG tablet Take 1 tablet by mouth daily. 11/24/15  Yes [provider]  metoprolol succinate (TOPROL-XL) 25 MG 24 hr tablet Take 1 tablet (25 mg total) by mouth daily. Patient not taking: Reported on 04/25/2019 12/13/18   Mariel Aloe, MD     Allergies:    No Known Allergies   Physical Exam:   Vitals  Blood pressure (!) 136/48, pulse (!) 108, temperature (!) 100.5 F (38.1 C), temperature source Rectal, resp. rate (!) 29, height '5\' 7"'$  (1.702 m), weight 68.9 kg, SpO2 100 %.  1.  General: axoxo3  2. Psychiatric: euthymic  3. Neurologic: nonfocal  4. HEENMT:  Anicteric, pupils 1.46m symmetric, direct, consensual  Neck: no jvd  5. Respiratory : Slight decrease in bs at right lung base, slight crackles at the left lung base, no wheezing  6. Cardiovascular : rrr s1, s2,  2/6 sem rusb/ apex  7. Gastrointestinal:  Abd: soft, nt, nd, +bs, negative murphy, ileostomy (clear yellow urine) R nephrostomy 8. Skin:  Ext: trace, edema,  Slight bruise on the dorsum of the right hand  9.Musculoskeletal:  Good ROM    Data Review:    CBC Recent Labs  Lab  04/25/19 1749  WBC 15.2*  HGB 11.5*  HCT 36.0*  PLT 231  MCV 102.9*  MCH 32.9  MCHC 31.9  RDW 15.2  LYMPHSABS 0.6*  MONOABS 0.7  EOSABS 0.0  BASOSABS 0.0   ------------------------------------------------------------------------------------------------------------------  Results for orders placed or performed during the hospital encounter of 04/25/19 (from the past 48 hour(s))  Urinalysis, Routine w reflex microscopic     Status: Abnormal   Collection Time: 04/25/19  5:46 PM  Result Value Ref Range   Color, Urine YELLOW YELLOW   APPearance CLEAR CLEAR   Specific Gravity, Urine 1.012 1.005 - 1.030   pH 6.0 5.0 - 8.0   Glucose, UA NEGATIVE NEGATIVE mg/dL   Hgb urine dipstick NEGATIVE NEGATIVE   Bilirubin Urine NEGATIVE NEGATIVE   Ketones, ur NEGATIVE NEGATIVE mg/dL   Protein, ur 100 (A) NEGATIVE mg/dL   Nitrite POSITIVE (A) NEGATIVE   Leukocytes,Ua LARGE (A) NEGATIVE   RBC / HPF 0-5 0 - 5 RBC/hpf   WBC, UA 21-50 0 - 5 WBC/hpf   Bacteria, UA FEW (A) NONE SEEN   Mucus PRESENT     Comment: Performed at [facility], [place] [address], [place], [place] [address]  Urinalysis, Routine w reflex microscopic     Status: Abnormal   Collection Time: 04/25/19  5:47 PM  Result Value Ref Range   Color, Urine AMBER (A) YELLOW    Comment: BIOCHEMICALS MAY BE AFFECTED BY COLOR   APPearance TURBID (A) CLEAR   Specific Gravity, Urine 1.014 1.005 - 1.030   pH 8.0 5.0 - 8.0   Glucose, UA NEGATIVE NEGATIVE mg/dL   Hgb urine dipstick SMALL (A) NEGATIVE   Bilirubin Urine NEGATIVE NEGATIVE   Ketones, ur NEGATIVE NEGATIVE mg/dL   Protein, ur >=300 (A) NEGATIVE mg/dL   Nitrite NEGATIVE NEGATIVE   Leukocytes,Ua LARGE (A) NEGATIVE   RBC / HPF >50 (H) 0 - 5 RBC/hpf   WBC, UA >50 (H) 0 - 5 WBC/hpf   Bacteria, UA MANY (A) NONE SEEN   Squamous Epithelial / LPF 0-5 0 - 5   WBC Clumps PRESENT    Mucus PRESENT    Hyaline Casts, UA PRESENT     Comment: Performed at [facility], [place] [address], [place], [place] [address]  Basic metabolic panel     Status: Abnormal   Collection Time: 04/25/19  5:49 PM  Result Value Ref Range   Sodium 139 135 - 145 mmol/L   Potassium 4.9 3.5 - 5.1 mmol/L   Chloride 105 98 - 111 mmol/L   CO2 26 22 - 32 mmol/L   Glucose, Bld 98 70 - 99 mg/dL   BUN 43 (H) 8 - 23 mg/dL   Creatinine, Ser 2.03 (H) 0.61 - 1.24 mg/dL   Calcium 8.5 (L) 8.9 - 10.3 mg/dL   GFR calc non Af Amer 27 (L) >60 mL/min   GFR calc Af Amer 31 (L) >60 mL/min   Anion gap 8 5 - 15    Comment: Performed at [facility] [address] [place], [place] [address]  CBC with Differential     Status: Abnormal   Collection  Time: 04/25/19  5:49 PM  Result Value Ref Range   WBC 15.2 (H) 4.0 - 10.5 K/uL   RBC 3.50 (L) 4.22 - 5.81 MIL/uL   Hemoglobin 11.5 (L) 13.0 - 17.0 g/dL   HCT 36.0 (L) 39.0 - 52.0 %   MCV 102.9 (H) 80.0 - 100.0 fL   MCH 32.9 26.0 - 34.0 pg   MCHC 31.9 30.0 - 36.0 g/dL   RDW 15.2 11.5 - 15.5 %   Platelets 231 150 - 400 K/uL   nRBC 0.0 0.0 - 0.2 %   Neutrophils Relative % 91 %   Neutro Abs 13.9 (H) 1.7 - 7.7 K/uL   Lymphocytes Relative 4 %   Lymphs Abs 0.6 (L) 0.7 - 4.0 K/uL   Monocytes Relative 5 %   Monocytes Absolute 0.7 0.1 - 1.0 K/uL   Eosinophils Relative 0 %   Eosinophils Absolute 0.0 0.0 - 0.5 K/uL   Basophils Relative 0 %   Basophils Absolute 0.0 0.0 - 0.1 K/uL   Immature Granulocytes 0 %   Abs Immature Granulocytes 0.05 0.00 - 0.07 K/uL    Comment: Performed at [facility], [place] [address] [place], [place] [address]  Lipase, blood     Status: None   Collection Time: 04/25/19  5:49 PM  Result Value Ref Range   Lipase 33 11 - 51 U/L    Comment: Performed at [facility], [place] [address] [place], [place] [address]  Hepatic function panel     Status: Abnormal   Collection Time: 04/25/19  5:49 PM  Result Value Ref Range   Total Protein 7.5 6.5 - 8.1 g/dL    Albumin 3.5 3.5 - 5.0 g/dL   AST 487 (H) 15 - 41 U/L   ALT 227 (H) 0 - 44 U/L   Alkaline Phosphatase 184 (H) 38 - 126 U/L   Total Bilirubin 2.4 (H) 0.3 - 1.2 mg/dL   Bilirubin, Direct 1.5 (H) 0.0 - 0.2 mg/dL   Indirect Bilirubin 0.9 0.3 - 0.9 mg/dL    Comment: Performed at [facility], [place] [address], [place], [place] [address]  POC SARS Coronavirus 2 Ag-ED - Nasal Swab (BD Veritor Kit)     Status: None   Collection Time: 04/25/19  6:06 PM  Result Value Ref Range   SARS Coronavirus 2 Ag NEGATIVE NEGATIVE    Comment: (NOTE) SARS-CoV-2 antigen NOT DETECTED.  Negative results are presumptive.  Negative results do not preclude SARS-CoV-2 infection and should not be used as the sole basis for treatment or other patient management decisions, including infection  control decisions, particularly in the presence of clinical signs and  symptoms consistent with COVID-19, or in those who have been in contact with the virus.  Negative results must be combined with clinical observations, patient history, and epidemiological information. The expected result is Negative. Fact Sheet for Patients: PodPark.tn Fact Sheet for Healthcare Providers: GiftContent.is This test is not yet approved or cleared by the [place] FDA and  has been authorized for detection and/or diagnosis of SARS-CoV-2 by FDA under an Emergency Use Authorization (EUA).  This EUA will remain in effect (meaning this test can be used) for the duration of  the COVID-19 de claration under Section 564(b)(1) of the Act, 21 U.S.C. section 360bbb-3(b)(1), unless the authorization is terminated or revoked sooner.   Respiratory Panel by RT PCR (Flu A&B, Covid) - Nasopharyngeal Swab     Status: None   Collection Time: 04/25/19  6:53 PM   Specimen: Nasopharyngeal Swab  Result Value Ref Range   SARS Coronavirus 2 by RT PCR NEGATIVE NEGATIVE    Comment:  (NOTE) SARS-CoV-2 target nucleic acids are NOT DETECTED. The SARS-CoV-2 RNA is generally detectable in upper respiratoy specimens during the acute phase of infection. The lowest concentration of SARS-CoV-2 viral copies this assay can detect is 131 copies/mL. A negative result does not preclude SARS-Cov-2 infection and should not be used as the sole basis for treatment or other patient management decisions. A negative result may occur with  improper specimen collection/handling, submission of specimen other than nasopharyngeal swab, presence of viral mutation(s) within the areas targeted by this assay, and inadequate number of viral copies (<131 copies/mL). A negative result must be combined with clinical observations, patient history, and epidemiological information. The expected result is Negative. Fact Sheet for Patients:  PinkCheek.be Fact Sheet for Healthcare Providers:  GravelBags.it This test is not yet ap proved or cleared by the [place] FDA and  has been authorized for detection and/or diagnosis of SARS-CoV-2 by FDA under an Emergency Use Authorization (EUA). This EUA will remain  in effect (meaning this test can be used) for the duration of the COVID-19 declaration under Section 564(b)(1) of the Act, 21 U.S.C. section 360bbb-3(b)(1), unless the authorization is terminated or revoked sooner.    Influenza A by PCR NEGATIVE NEGATIVE   Influenza B by PCR NEGATIVE NEGATIVE    Comment: (NOTE) The Xpert Xpress SARS-CoV-2/FLU/RSV assay is intended as an aid in  the diagnosis of influenza from Nasopharyngeal swab specimens and  should not be used as a sole basis for treatment. Nasal washings and  aspirates are unacceptable for Xpert Xpress SARS-CoV-2/FLU/RSV  testing. Fact Sheet for Patients: PinkCheek.be Fact Sheet for Healthcare Providers: GravelBags.it This  test is not yet approved or cleared by the [place] FDA and  has been authorized for detection and/or diagnosis of SARS-CoV-2 by  FDA under an Emergency Use Authorization (EUA). This EUA will remain  in effect (meaning this test can be used) for the duration of the  Covid-19 declaration under Section 564(b)(1) of the Act, 21  U.S.C. section 360bbb-3(b)(1), unless the authorization is  terminated or revoked. Performed at [facility], [place] [address] [place], [place] [address]     Chemistries  Recent Labs  Lab 04/25/19 1749  NA 139  K 4.9  CL 105  CO2 26  GLUCOSE 98  BUN 43*  CREATININE 2.03*  CALCIUM 8.5*  AST 487*  ALT 227*  ALKPHOS 184*  BILITOT 2.4*   ------------------------------------------------------------------------------------------------------------------  ------------------------------------------------------------------------------------------------------------------ GFR: Estimated Creatinine Clearance: 20.4 mL/min (A) (by C-G formula based on SCr of 2.03 mg/dL (H)). Liver Function Tests: Recent Labs  Lab 04/25/19 1749  AST 487*  ALT 227*  ALKPHOS 184*  BILITOT 2.4*  PROT 7.5  ALBUMIN 3.5   Recent Labs  Lab 04/25/19 1749  LIPASE 33   No results for input(s): AMMONIA in the last 168 hours. Coagulation Profile: No results for input(s): INR, PROTIME in the last 168 hours. Cardiac Enzymes: No results for input(s): CKTOTAL, CKMB, CKMBINDEX, TROPONINI in the last 168 hours. BNP (last 3 results) No results for input(s): PROBNP in the last 8760 hours. HbA1C: No results for input(s): HGBA1C in the last 72 hours. CBG: No results for input(s): GLUCAP in the last 168 hours. Lipid Profile: No results for input(s): CHOL, HDL, LDLCALC, TRIG, CHOLHDL, LDLDIRECT in the last 72 hours. Thyroid Function Tests: No results for  input(s): TSH, T4TOTAL, FREET4, T3FREE, THYROIDAB in the last 72 hours. Anemia Panel: No results for input(s):  VITAMINB12, FOLATE, FERRITIN, TIBC, IRON, RETICCTPCT in the last 72 hours.  --------------------------------------------------------------------------------------------------------------- Urine analysis:    Component Value Date/Time   COLORURINE AMBER (A) 04/25/2019 1747   APPEARANCEUR TURBID (A) 04/25/2019 1747   LABSPEC 1.014 04/25/2019 1747   PHURINE 8.0 04/25/2019 1747   GLUCOSEU NEGATIVE 04/25/2019 1747   HGBUR SMALL (A) 04/25/2019 1747   BILIRUBINUR NEGATIVE 04/25/2019 1747   KETONESUR NEGATIVE 04/25/2019 1747   PROTEINUR >=300 (A) 04/25/2019 1747   UROBILINOGEN 0.2 02/04/2014 2157   NITRITE NEGATIVE 04/25/2019 1747   LEUKOCYTESUR LARGE (A) 04/25/2019 1747      Imaging Results:    DG Chest Portable 1 View  Result Date: 04/25/2019 CLINICAL DATA:  Shortness of breath. Bradycardia. EXAM: PORTABLE CHEST 1 VIEW COMPARISON:  12/09/2018 FINDINGS: Mild cardiomegaly remains stable. Pacemaker remains in place. Previously seen bibasilar pulmonary opacity is resolved since previous study. A nodular opacity is seen in the lateral right lung base. IMPRESSION: 1. Nodular opacity in lateral right lung base. Recommend chest CT to exclude pulmonary nodule. 2. Cardiomegaly. Electronically Signed   By: Marlaine Hind M.D.   On: 04/25/2019 17:11   CT RENAL STONE STUDY  Result Date: 04/25/2019 CLINICAL DATA:  Flank pain with kidney stones suspected EXAM: CT ABDOMEN AND PELVIS WITHOUT CONTRAST TECHNIQUE: Multidetector CT imaging of the abdomen and pelvis was performed following the standard protocol without IV contrast. COMPARISON:  June 25, 2017 FINDINGS: Lower chest: There is a moderate to large right-sided pleural effusion that is only partially visualized on this exam. There is suggestion of a 1.9 cm subpleural nodule involving the right middle lobe (axial series 2, image 6).The heart is enlarged. There is a small pericardial effusion. Hepatobiliary: The liver is normal. Cholelithiasis without acute  inflammation.Multiple stones are noted in the common bile duct. There is mild biliary ductal dilatation with the common bile duct measuring up to 1.4 cm. Pancreas: Normal contours without ductal dilatation. No peripancreatic fluid collection. Spleen: No splenic laceration or hematoma. Adrenals/Urinary Tract: --Adrenal glands: No adrenal hemorrhage. --Right kidney/ureter: There is a right-sided percutaneous nephrostomy tube. There is no evidence for hydronephrosis. The nephrostomy tube appears somewhat kinked but is likely functional given the lack of hydronephrosis. There is mild dilatation of the right ureter. --Left kidney/ureter: No evidence for hydronephrosis. There is a stone in the lower pole measuring approximately 1.4 cm. The patient may be status post prior left renal ablation. --Urinary bladder: The urinary bladder is surgically absent. An ileal conduit is noted. Stomach/Bowel: --Stomach/Duodenum: The stomach is moderately distended. --Small bowel: No dilatation or inflammation. --Colon: Rectosigmoid diverticulosis without acute inflammation. --Appendix: Not visualized. No right lower quadrant inflammation or free fluid. Vascular/Lymphatic: Atherosclerotic calcification is present within the non-aneurysmal abdominal aorta, without hemodynamically significant stenosis. --No retroperitoneal lymphadenopathy. --No mesenteric lymphadenopathy. --there are bilateral fat containing inguinal hernias, left greater than right. There is a low midline ventral wall hernia containing a loop of small bowel without evidence for obstruction (axial series 2, image 68). Reproductive: Prostate gland is surgically absent. Other: No ascites or free air. The abdominal wall is normal. Musculoskeletal. No acute displaced fractures. IMPRESSION: 1. No specific abnormality to explain the patient's left flank pain. There is no left-sided hydronephrosis. There is a 1.4 cm nonobstructing stone in lower pole the left kidney. 2. Moderate  to large right-sided pleural effusion. Small left-sided pleural effusion. 3. Cholelithiasis with CT evidence for choledocholithiasis.  Multiple stones are noted in the common bile duct of both proximally and distally. The common bile duct is dilated and there is mild intrahepatic biliary ductal dilatation. There is no CT evidence for acute calculus cholecystitis. 4. Right-sided percutaneous nephrostomy tube in place. While the tube appears slightly tortuous and possibly kinked, the tube appears to be functioning appropriately as there is no right-sided hydronephrosis. 5. Findings concerning for 1.9 cm mass in the right middle lobe. Follow-up with a nonemergent contrast enhanced CT of the chest is recommended for further evaluation of this finding. 6. Multiple additional chronic findings as detailed above. Aortic Atherosclerosis (ICD10-I70.0). Electronically Signed   By: Constance Holster M.D.   On: 04/25/2019 18:50   US Abdomen Limited RUQ  Result Date: 04/25/2019 CLINICAL DATA:  86 year old male with right upper quadrant abdominal pain for 1 week. Cholelithiasis, choledocholithiasis, and right pleural effusion on CT Abdomen and Pelvis today. EXAM: ULTRASOUND ABDOMEN LIMITED RIGHT UPPER QUADRANT COMPARISON:  CT Abdomen and Pelvis 1818 hours today. FINDINGS: Gallbladder: Gallbladder wall thickness remains normal at 1 to 2 millimeters. Echogenic gallstones individually estimated at 14 millimeters (image 12). No pericholecystic fluid. No sonographic Murphy sign elicited. Common bile duct: Diameter: 13 millimeters, dilated. Cannot visualize the filling defects within the ducts seen by CT today. Liver: Evidence of intrahepatic biliary ductal dilatation on image 48. Background liver echogenicity within normal limits. No discrete liver lesion. Portal vein is patent on color Doppler imaging with normal direction of blood flow towards the liver. Other: Right pleural effusion redemonstrated. IMPRESSION: 1. Dilated intra-  and extrahepatic biliary tree due to the multifocal Choledocholithiasis demonstrated by CT today. 2. Cholelithiasis, no superimposed acute cholecystitis. 3. Right pleural effusion redemonstrated. Electronically Signed   By: Genevie Ann M.D.   On: 04/25/2019 19:50       Assessment & Plan:    Principal Problem:   Sepsis ([place]) Active Problems:   Hypertension   CKD (chronic kidney disease), stage IV (HCC)   Acute lower UTI   Abnormal liver function  Sepsis (fever, tachycardia, leukocytosis) Acute UTI Blood culture x2 Urine culture vanco iv, zosyn iv pharmacy to dose Check cbc in am  Abnormal liver function  NPO Check acute hepatitis panel Check cpk Check cmp in am [facility]), consulted by Ed, appreciate input  Tachycardia Check trop I If positive consider cardiac echo, pt had last echo in September 2020, EF 25%  R mid lung mass 1.9cm ? Check CT chest w/o contrast due to renal insufficiency  CKD stage4 Check cmp in am  H/o CHF (ef 20-25%), Moderate AS Cont Lasix '20mg'$  po qod Cont Toprol XL 12.'5mg'$  po qday  Hypothyroidism Cont Levothyroxine 25 micrograms po qday  Remote DVT after pacer Cont Aspirin  Gout Hold Uloric '40mg'$  po qday due to abnormal liver function (5-7% abnormal liver function0       DVT Prophylaxis-   Lovenox - SCDs  AM Labs Ordered, also please review Full Orders  Family Communication: Admission, patients condition and plan of care including tests being ordered have been discussed with the patient  who indicate understanding and agree with the plan and Code Status.  Code Status:  DNR per patient, daughter present in ED with patient  Admission status:  Inpatient: Based on patients clinical presentation and evaluation of above clinical data, I have made determination that patient meets Inpatient criteria at this time.  Pt has sepsis and will require iv abx, pt has high risk of clincal deterioration, pt will require >  2 nites stay.   Time  spent in minutes : 70 minutes   Jani Gravel M.D on 04/25/2019 at 8:41 PM

## 2019-04-25 NOTE — Progress Notes (Signed)
Pharmacy Antibiotic Note  [name] [name] is a 86 y.o. male admitted on 04/25/2019 with L Flank pain.  Pharmacy has been consulted for Vancomycin & Zosyn dosing.  Plan: Vancomycin 1gm q48hr, AUC 473, SCr 2.03 Zosyn 3.375gm x1 then 2.25gm q6 Daily SCr  Height: 5\' 7"  (170.2 cm) Weight: 152 lb (68.9 kg) IBW/kg (Calculated) : 66.1  Temp (24hrs), Avg:98.9 F (37.2 C), Min:97.2 F (36.2 C), Max:100.5 F (38.1 C)  Recent Labs  Lab 04/25/19 1749 04/25/19 2033  WBC 15.2*  --   CREATININE 2.03*  --   LATICACIDVEN  --  1.6    Estimated Creatinine Clearance: 20.4 mL/min (A) (by C-G formula based on SCr of 2.03 mg/dL (H)).    No Known Allergies  Antimicrobials this admission: 1/31 Zosyn >>  1/31 Vancomycin >>   Dose adjustments this admission:  Microbiology results: 1/312sent BCx: 1/31 1/31 UCx: sent   Thank you for allowing pharmacy to be a part of this patient's care.  Minda Ditto PharmD 04/25/2019 9:08 PM

## 2019-04-25 NOTE — Progress Notes (Signed)
Lovenox per Pharmacy for DVT Prophylaxis    Pharmacy has been consulted from dosing enoxaparin (lovenox) in this patient for DVT prophylaxis.  The pharmacist has reviewed pertinent labs (Hgb _11.5__; PLT__231_), patient weight (_68.9__kg) and renal function (CrCl_20__mL/min) and decided that enoxaparin _30_mg SQ Q24Hrs is appropriate for this patient.  The pharmacy department will sign off at this time.  Please reconsult pharmacy if status changes or for further issues.  Thank you  Cyndia Diver PharmD, BCPS  04/25/2019, 10:02 PM

## 2019-04-25 NOTE — Plan of Care (Signed)
  Problem: Education: Goal: Knowledge of General Education information will improve Description: Including pain rating scale, medication(s)/side effects and non-pharmacologic comfort measures Outcome: Progressing   Problem: Health Behavior/Discharge Planning: Goal: Ability to manage health-related needs will improve Outcome: Progressing   Problem: Clinical Measurements: Goal: Ability to maintain clinical measurements within normal limits will improve Outcome: Progressing Goal: Will remain free from infection Outcome: Progressing Goal: Diagnostic test results will improve Outcome: Progressing Goal: Respiratory complications will improve Outcome: Progressing Goal: Cardiovascular complication will be avoided Outcome: Progressing   Problem: Coping: Goal: Level of anxiety will decrease Outcome: Progressing   Problem: Elimination: Goal: Will not experience complications related to urinary retention Outcome: Progressing   Problem: Pain Managment: Goal: General experience of comfort will improve Outcome: Progressing   Problem: Safety: Goal: Ability to remain free from injury will improve Outcome: Progressing   Problem: Skin Integrity: Goal: Risk for impaired skin integrity will decrease Outcome: Progressing   Problem: Fluid Volume: Goal: Hemodynamic stability will improve Outcome: Progressing   Problem: Clinical Measurements: Goal: Diagnostic test results will improve Outcome: Progressing Goal: Signs and symptoms of infection will decrease Outcome: Progressing   Problem: Respiratory: Goal: Ability to maintain adequate ventilation will improve Outcome: Progressing   

## 2019-04-25 NOTE — ED Provider Notes (Signed)
Medical screening examination/treatment/procedure(s) were conducted as a shared visit with non-physician practitioner(s) and myself.  I personally evaluated the patient during the encounter. Briefly, the patient is a 86 y.o. male with history of bladder cancer status post urostomy and right-sided nephrostomy who presents to the ED with left-sided flank pain.  Patient concerned about kidney stone or obstruction.  Has been concerned about decreased output from urostomy.  Denies any fevers or chills.  No other specific abdominal pain.  No diarrhea or constipation.  No nausea or vomiting.  Has some left CVA tenderness on exam.  Vital signs otherwise are unremarkable.  We will get basic labs including CT scan abdomen pelvis.  Chest x-ray performed shows no signs of volume overload.  No pneumonia.  Patient has pulmonary nodule and were made aware of the need for likely outpatient CT scan however given his age may not pursue but recommend that they discuss this with primary care doctor.  Concern for pyelonephritis versus kidney stone.  CT scan did not show any kidney stones or hydronephrosis.  Urinalysis concerning for infection.  Patient did develop a fever and tachycardia while he was here and a code sepsis was initiated.  Blood cultures and lactic acid were collected.  Patient was started on IV Zosyn.  CT scan did show multiple gallstones within the biliary tree.  Liver enzymes are mildly elevated including bilirubin however lipase was normal.  Right upper quadrant ultrasound showed multifocal choledocholithiasis.  GI has been consulted.  However there is no acute cholecystitis.  There could be an element of cholangitis however patient with normal mentation.  Has been covered with IV antibiotics.  Patient hemodynamically stable otherwise.  Does have right-sided pleural effusion as well.  Minor respiratory symptoms.  Admitted in stable condition.  This chart was dictated using voice recognition software.  Despite  best efforts to proofread,  errors can occur which can change the documentation meaning.     EKG Interpretation None           Lennice Sites, DO 04/25/19 2021

## 2019-04-25 NOTE — ED Triage Notes (Signed)
Patient reports left sided flank pain. Patient has 2 urostomy tubes and is having flank pain on the left side.  Patient reportedly fell twice over 1 week ago

## 2019-04-25 NOTE — Discharge Instructions (Signed)
Lung nodule on CXR. Will need Chest CT.

## 2019-04-25 NOTE — ED Provider Notes (Signed)
[facility] DEPT Provider Note   CSN: [id number] Arrival date & time: 04/25/19  1521     History Chief Complaint  Patient presents with  . Flank Pain    QUASIM DOYON is a 86 y.o. male.  HPI      ZORAN YANKEE is a 86 y.o. male, with a history of bladder cancer with cystectomy, HTN, CKD, DVT, presenting to the ED accompanied by his daughter with left flank pain beginning this morning.  He endorses worsening pain, feels like a pressure, currently 4/10, radiating from the left flank into the left lower back and left side of the abdomen.  He states he usually empties at least an estimated 200-300 cc every 3 hours of urine from his urostomy collection bags.  They were last emptied at around noon and he had "only small amount" of urine at that time. Last bowel movement was this morning and was normal. He has also been complaining of some shortness of breath today.  Patient was noted to have had replacement of his right nephrostomy tube January 7.  Denies fever/chills, other abdominal pain, chest pain, N/V/C/D, syncope, cough, or any other complaints.   Past Medical History:  Diagnosis Date  . Aortic valve stenosis    a. mild to moderate by echo 2013  . Bradycardia    MDT dual chamber pacemaker  . Cancer [facility])    bladder cancer  . Chronic kidney disease   . Deep venous thrombosis (HCC)    hx of  LEFT ARM AFTER PACEMAKER INSERTION ABOUT 6 YRS AGO  . Depression   . Gout   . Hypertension   . Kidney stone   . Macular degeneration    PT STATES HIS VISION STILL OKAY FOR DRIVING  . Osteoporosis   . Prostate cancer ([place])    AND BLADDER CANCER - S/P URETEROILEAL CONDUIT  . Renal disorder   . S/P ileal conduit [facility])         Patient Active Problem List   Diagnosis Date Noted  . Acute on chronic diastolic CHF (congestive heart failure) ([place]) 12/09/2018  . CHF exacerbation ([place]) 12/09/2018  . Malfunction of nephrostomy tube ([place])  06/13/2017  . HTN (hypertension) 06/13/2017  . Gout 06/13/2017  . Legally blind 06/13/2017  . Bladder cancer ([place]) 06/13/2017  . Blood poisoning   . Bacteremia due to group B Streptococcus 02/07/2014  . Sepsis ([place]) 02/04/2014  . UTI (urinary tract infection) 07/30/2013  . ARF (acute renal failure) ([place]) 07/29/2013  . CKD (chronic kidney disease) 07/29/2013  . Fever, unspecified 07/29/2013  . Renal stone 07/27/2013  . Hydronephrosis, right 07/15/2013  . Infection of urinary tract 07/15/2013  . Hyperkalemia 07/15/2013  . Kidney stone 08/20/2011  . Obstructive uropathy 08/16/2011  . Acute renal failure ([place]) 08/16/2011  . Leukocytosis 08/16/2011  . Gout 08/16/2011  . Aortic stenosis 08/16/2011  . Hypertension 10/02/2010  . Pacemaker 10/02/2010  . Sinus bradycardia 10/02/2010    Past Surgical History:  Procedure Laterality Date  . 07/20/13  CONVERSION OF RIGHT SIDED NEPHROSTOMY CATHETER TO RIGHT SIDED NEPHRO URETERAL CATHETER - DONE IN INTERVENTIONAL RADIOLOGY    . APPENDECTOMY    . CATARACT EXTRACTION, BILATERAL    . CYSTECTOMY    . ilial conduit     for bladder cancer  . IR GENERIC HISTORICAL  10/26/2015   IR NEPHROSTOMY TUBE CHANGE 10/26/2015 Jacqulynn Cadet, MD [facility]INTERV RAD  . IR GENERIC HISTORICAL  12/07/2015   IR NEPHROSTOMY TUBE  CHANGE 12/07/2015 Greggory Keen, MD [facility]INTER[facility]  . IR GENERIC HISTORICAL  01/11/2016   IR NEPHROSTOMY TUBE CHANGE 01/11/2016 Greggory Keen, MD [facility]  . IR GENERIC HISTORICAL  02/08/2016   IR NEPHROSTOMY TUBE CHANGE 02/08/2016 [facility]  . IR GENERIC HISTORICAL  03/14/2016   IR NEPHROSTOMY TUBE CHANGE 03/14/2016 Jacqulynn Cadet, MD [facility]  . IR GENERIC HISTORICAL  04/22/2016   IR NEPHROSTOMY TUBE CHANGE 04/22/2016 Aletta Edouard, MD [facility]  . IR GENERIC HISTORICAL  05/27/2016   IR NEPHROSTOMY TUBE CHANGE 05/27/2016 Arne Cleveland, MD [facility]  . IR NEPHROSTOMY EXCHANGE RIGHT  07/25/2017  . IR NEPHROSTOMY EXCHANGE RIGHT   10/13/2017  . IR NEPHROSTOMY EXCHANGE RIGHT  11/18/2017  . IR NEPHROSTOMY EXCHANGE RIGHT  12/30/2017  . IR NEPHROSTOMY EXCHANGE RIGHT  02/10/2018  . IR NEPHROSTOMY EXCHANGE RIGHT  03/23/2018  . IR NEPHROSTOMY EXCHANGE RIGHT  05/04/2018  . IR NEPHROSTOMY EXCHANGE RIGHT  06/25/2018  . IR NEPHROSTOMY EXCHANGE RIGHT  08/13/2018  . IR NEPHROSTOMY EXCHANGE RIGHT  10/01/2018  . IR NEPHROSTOMY EXCHANGE RIGHT  11/19/2018  . IR NEPHROSTOMY EXCHANGE RIGHT  01/05/2019  . IR NEPHROSTOMY EXCHANGE RIGHT  04/01/2019  . IR NEPHROSTOMY PLACEMENT RIGHT  06/13/2017  . IR NEPHROSTOMY PLACEMENT RIGHT  02/23/2019  . IR NEPHROSTOMY TUBE CHANGE  07/01/2016  . IR NEPHROSTOMY TUBE CHANGE  08/05/2016  . IR NEPHROSTOMY TUBE CHANGE  09/18/2016  . IR NEPHROSTOMY TUBE CHANGE  10/24/2016  . IR NEPHROSTOMY TUBE CHANGE  11/18/2016  . IR NEPHROSTOMY TUBE CHANGE  12/30/2016  . IR NEPHROSTOMY TUBE CHANGE  02/03/2017  . IR NEPHROSTOMY TUBE CHANGE  03/13/2017  . IR NEPHROSTOMY TUBE CHANGE  04/17/2017  . IR NEPHROSTOMY TUBE CHANGE  05/28/2017  . IR NEPHROSTOMY TUBE CHANGE  09/04/2017  . KIDNEY STONE SURGERY    . LYMPHADENECTOMY    . NEPHROLITHOTOMY  09/02/2011   Procedure: NEPHROLITHOTOMY PERCUTANEOUS;  Surgeon: Malka So, MD;  Location: [facility] ORS;  Service: Urology;  Laterality: Left;  . NEPHROLITHOTOMY Right 07/27/2013   Procedure: RIGHT PERCUTANEOUS NEPHROLITHOTOMY ;  Surgeon: Irine Seal, MD;  Location: [facility] ORS;  Service: Urology;  Laterality: Right;  . PACEMAKER INSERTION  2009   MDT dual chamber pacemaker implanted by Dr Verlon Setting  . PROSTATE SURGERY         Family History  Problem Relation Age of Onset  . Pancreatic cancer Father 19       died  . Pancreatic cancer Mother 19       died    Social History   Tobacco Use  . Smoking status: Former Smoker    Types: Cigarettes    Quit date: 03/25/1952    Years since quitting: 67.1  . Smokeless tobacco: Never Used  Substance Use Topics  . Alcohol use: No  . Drug use: No    Home  Medications Prior to Admission medications   Medication Sig Start Date End Date Taking? Authorizing Provider  acetaminophen (TYLENOL) 500 MG tablet Take 1,000 mg by mouth every 8 (eight) hours as needed for mild pain or headache.   Yes [provider]  aspirin EC 81 MG tablet Take 81 mg by mouth every morning.   Yes [provider]  Besifloxacin HCl (BESIVANCE) 0.6 % SUSP Apply 1 drop to eye See admin instructions. Only uses before eye shot he receives every 10 weeks.   Yes [provider]  Cholecalciferol (VITAMIN D-3) 1000 UNITS CAPS Take 1 capsule by mouth every  morning.   Yes [provider]  colchicine 0.6 MG tablet Take 0.6 mg by mouth as needed (gout).    Yes [provider]  docusate sodium (COLACE) 100 MG capsule Take 300 mg by mouth daily.    Yes [provider]  furosemide (LASIX) 20 MG tablet Take 1 tablet (20 mg total) by mouth daily. 12/16/18 12/11/19 Yes Burnell Blanks, MD  Hyprom-Naphaz-Polysorb-Zn Sulf (CLEAR EYES COMPLETE OP) Apply 1-2 drops to eye daily as needed (dry eyes).   Yes [provider]  levothyroxine (SYNTHROID) 25 MCG tablet Take 25 mcg by mouth every morning. 04/07/19  Yes [provider]  metoprolol succinate (TOPROL-XL) 12.5 mg TB24 24 hr tablet Take 12.5 mg by mouth daily.   Yes [provider]  Multiple Vitamins-Minerals (VITEYES AREDS ADVANCED PO) Take 2 capsules by mouth daily.   Yes [provider]  OMEGA-3 KRILL OIL PO Take 1 tablet by mouth every morning.   Yes [provider]  polyethylene glycol powder (GLYCOLAX/MIRALAX) 17 GM/SCOOP powder Take 17 g by mouth daily as needed for mild constipation. Takes 17grams (1 capful) as needed. 12/12/18  Yes Mariel Aloe, MD  ULORIC 40 MG tablet Take 1 tablet by mouth daily. 11/24/15  Yes [provider]  metoprolol succinate (TOPROL-XL) 25 MG 24 hr tablet Take 1 tablet (25 mg total) by mouth daily. Patient  not taking: Reported on 04/25/2019 12/13/18   Mariel Aloe, MD    Allergies    Patient has no known allergies.  Review of Systems   Review of Systems  Constitutional: Negative for chills and fever.  Respiratory: Positive for shortness of breath. Negative for cough.   Cardiovascular: Negative for chest pain and leg swelling.  Gastrointestinal: Negative for diarrhea, nausea and vomiting.  Genitourinary: Positive for flank pain.  Neurological: Negative for syncope and weakness.  All other systems reviewed and are negative.   Physical Exam Updated Vital Signs BP (!) 169/58 (BP Location: Right Arm)   Pulse 63   Temp (!) 97.2 F (36.2 C) (Oral)   Resp 17   Ht 5\' 7"  (1.702 m)   Wt 68.9 kg   SpO2 94%   BMI 23.81 kg/m   Physical Exam Vitals and nursing note reviewed.  Constitutional:      General: He is not in acute distress.    Appearance: He is well-developed. He is not diaphoretic.  HENT:     Head: Normocephalic and atraumatic.     Mouth/Throat:     Mouth: Mucous membranes are moist.     Pharynx: Oropharynx is clear.  Eyes:     Conjunctiva/sclera: Conjunctivae normal.  Cardiovascular:     Rate and Rhythm: Normal rate and regular rhythm.     Pulses: Normal pulses.          Radial pulses are 2+ on the right side and 2+ on the left side.       Posterior tibial pulses are 2+ on the right side and 2+ on the left side.     Heart sounds: Normal heart sounds.     Comments: Tactile temperature in the extremities appropriate and equal bilaterally. Pulmonary:     Effort: Pulmonary effort is normal. No respiratory distress.     Breath sounds: Normal breath sounds.  Abdominal:     Palpations: Abdomen is soft.     Tenderness: There is abdominal tenderness. There is no guarding.    Musculoskeletal:     Cervical back: Neck supple.  Back:     Right lower leg: No edema.     Left lower leg: No edema.  Lymphadenopathy:     Cervical: No cervical adenopathy.  Skin:     General: Skin is warm and dry.  Neurological:     Mental Status: He is alert.  Psychiatric:        Mood and Affect: Mood and affect normal.        Speech: Speech normal.        Behavior: Behavior normal.     ED Results / Procedures / Treatments   Labs (all labs ordered are listed, but only abnormal results are displayed) Labs Reviewed  BASIC METABOLIC PANEL - Abnormal; Notable for the following components:      Result Value   BUN 43 (*)    Creatinine, Ser 2.03 (*)    Calcium 8.5 (*)    GFR calc non Af Amer 27 (*)    GFR calc Af Amer 31 (*)    All other components within normal limits  CBC WITH DIFFERENTIAL/PLATELET - Abnormal; Notable for the following components:   WBC 15.2 (*)    RBC 3.50 (*)    Hemoglobin 11.5 (*)    HCT 36.0 (*)    MCV 102.9 (*)    Neutro Abs 13.9 (*)    Lymphs Abs 0.6 (*)    All other components within normal limits  HEPATIC FUNCTION PANEL - Abnormal; Notable for the following components:   AST 487 (*)    ALT 227 (*)    Alkaline Phosphatase 184 (*)    Total Bilirubin 2.4 (*)    Bilirubin, Direct 1.5 (*)    All other components within normal limits  URINALYSIS, ROUTINE W REFLEX MICROSCOPIC - Abnormal; Notable for the following components:   Protein, ur 100 (*)    Nitrite POSITIVE (*)    Leukocytes,Ua LARGE (*)    Bacteria, UA FEW (*)    All other components within normal limits  URINALYSIS, ROUTINE W REFLEX MICROSCOPIC - Abnormal; Notable for the following components:   Color, Urine AMBER (*)    APPearance TURBID (*)    Hgb urine dipstick SMALL (*)    Protein, ur >=300 (*)    Leukocytes,Ua LARGE (*)    RBC / HPF >50 (*)    WBC, UA >50 (*)    Bacteria, UA MANY (*)    All other components within normal limits  RESPIRATORY PANEL BY RT PCR (FLU A&B, COVID)  URINE CULTURE  URINE CULTURE  CULTURE, BLOOD (ROUTINE X 2)  CULTURE, BLOOD (ROUTINE X 2)  LIPASE, BLOOD  LACTIC ACID, PLASMA  LACTIC ACID, PLASMA  BRAIN NATRIURETIC PEPTIDE  POC SARS  CORONAVIRUS 2 AG -  ED    BUN  Date Value Ref Range Status  04/25/2019 43 (H) 8 - 23 mg/dL Final  12/16/2018 58 (H) 10 - 36 mg/dL Final  12/12/2018 71 (H) 8 - 23 mg/dL Final  12/12/2018 73 (H) 8 - 23 mg/dL Final  12/11/2018 61 (H) 8 - 23 mg/dL Final   Creatinine, Ser  Date Value Ref Range Status  04/25/2019 2.03 (H) 0.61 - 1.24 mg/dL Final  12/16/2018 2.19 (H) 0.76 - 1.27 mg/dL Final  12/12/2018 2.90 (H) 0.61 - 1.24 mg/dL Final  12/12/2018 2.98 (H) 0.61 - 1.24 mg/dL Final   WBC  Date Value Ref Range Status  04/25/2019 15.2 (H) 4.0 - 10.5 K/uL Final  12/10/2018 6.8 4.0 - 10.5 K/uL Final  12/09/2018 7.4 4.0 -  10.5 K/uL Final  06/15/2017 7.9 4.0 - 10.5 K/uL Final    EKG None  Radiology DG Chest Portable 1 View  Result Date: 04/25/2019 CLINICAL DATA:  Shortness of breath. Bradycardia. EXAM: PORTABLE CHEST 1 VIEW COMPARISON:  12/09/2018 FINDINGS: Mild cardiomegaly remains stable. Pacemaker remains in place. Previously seen bibasilar pulmonary opacity is resolved since previous study. A nodular opacity is seen in the lateral right lung base. IMPRESSION: 1. Nodular opacity in lateral right lung base. Recommend chest CT to exclude pulmonary nodule. 2. Cardiomegaly. Electronically Signed   By: Marlaine Hind M.D.   On: 04/25/2019 17:11   CT RENAL STONE STUDY  Result Date: 04/25/2019 CLINICAL DATA:  Flank pain with kidney stones suspected EXAM: CT ABDOMEN AND PELVIS WITHOUT CONTRAST TECHNIQUE: Multidetector CT imaging of the abdomen and pelvis was performed following the standard protocol without IV contrast. COMPARISON:  June 25, 2017 FINDINGS: Lower chest: There is a moderate to large right-sided pleural effusion that is only partially visualized on this exam. There is suggestion of a 1.9 cm subpleural nodule involving the right middle lobe (axial series 2, image 6).The heart is enlarged. There is a small pericardial effusion. Hepatobiliary: The liver is normal. Cholelithiasis without acute  inflammation.Multiple stones are noted in the common bile duct. There is mild biliary ductal dilatation with the common bile duct measuring up to 1.4 cm. Pancreas: Normal contours without ductal dilatation. No peripancreatic fluid collection. Spleen: No splenic laceration or hematoma. Adrenals/Urinary Tract: --Adrenal glands: No adrenal hemorrhage. --Right kidney/ureter: There is a right-sided percutaneous nephrostomy tube. There is no evidence for hydronephrosis. The nephrostomy tube appears somewhat kinked but is likely functional given the lack of hydronephrosis. There is mild dilatation of the right ureter. --Left kidney/ureter: No evidence for hydronephrosis. There is a stone in the lower pole measuring approximately 1.4 cm. The patient may be status post prior left renal ablation. --Urinary bladder: The urinary bladder is surgically absent. An ileal conduit is noted. Stomach/Bowel: --Stomach/Duodenum: The stomach is moderately distended. --Small bowel: No dilatation or inflammation. --Colon: Rectosigmoid diverticulosis without acute inflammation. --Appendix: Not visualized. No right lower quadrant inflammation or free fluid. Vascular/Lymphatic: Atherosclerotic calcification is present within the non-aneurysmal abdominal aorta, without hemodynamically significant stenosis. --No retroperitoneal lymphadenopathy. --No mesenteric lymphadenopathy. --there are bilateral fat containing inguinal hernias, left greater than right. There is a low midline ventral wall hernia containing a loop of small bowel without evidence for obstruction (axial series 2, image 68). Reproductive: Prostate gland is surgically absent. Other: No ascites or free air. The abdominal wall is normal. Musculoskeletal. No acute displaced fractures. IMPRESSION: 1. No specific abnormality to explain the patient's left flank pain. There is no left-sided hydronephrosis. There is a 1.4 cm nonobstructing stone in lower pole the left kidney. 2. Moderate  to large right-sided pleural effusion. Small left-sided pleural effusion. 3. Cholelithiasis with CT evidence for choledocholithiasis. Multiple stones are noted in the common bile duct of both proximally and distally. The common bile duct is dilated and there is mild intrahepatic biliary ductal dilatation. There is no CT evidence for acute calculus cholecystitis. 4. Right-sided percutaneous nephrostomy tube in place. While the tube appears slightly tortuous and possibly kinked, the tube appears to be functioning appropriately as there is no right-sided hydronephrosis. 5. Findings concerning for 1.9 cm mass in the right middle lobe. Follow-up with a nonemergent contrast enhanced CT of the chest is recommended for further evaluation of this finding. 6. Multiple additional chronic findings as detailed above. Aortic Atherosclerosis (ICD10-I70.0). Electronically  Signed   By: Constance Holster M.D.   On: 04/25/2019 18:50   US Abdomen Limited RUQ  Result Date: 04/25/2019 CLINICAL DATA:  86 year old male with right upper quadrant abdominal pain for 1 week. Cholelithiasis, choledocholithiasis, and right pleural effusion on CT Abdomen and Pelvis today. EXAM: ULTRASOUND ABDOMEN LIMITED RIGHT UPPER QUADRANT COMPARISON:  CT Abdomen and Pelvis 1818 hours today. FINDINGS: Gallbladder: Gallbladder wall thickness remains normal at 1 to 2 millimeters. Echogenic gallstones individually estimated at 14 millimeters (image 12). No pericholecystic fluid. No sonographic Murphy sign elicited. Common bile duct: Diameter: 13 millimeters, dilated. Cannot visualize the filling defects within the ducts seen by CT today. Liver: Evidence of intrahepatic biliary ductal dilatation on image 48. Background liver echogenicity within normal limits. No discrete liver lesion. Portal vein is patent on color Doppler imaging with normal direction of blood flow towards the liver. Other: Right pleural effusion redemonstrated. IMPRESSION: 1. Dilated intra-  and extrahepatic biliary tree due to the multifocal Choledocholithiasis demonstrated by CT today. 2. Cholelithiasis, no superimposed acute cholecystitis. 3. Right pleural effusion redemonstrated. Electronically Signed   By: Genevie Ann M.D.   On: 04/25/2019 19:50    Procedures Procedures (including critical care time)  Medications Ordered in ED Medications  acetaminophen (TYLENOL) tablet 1,000 mg (has no administration in time range)  sodium chloride 0.9 % bolus 500 mL (has no administration in time range)  piperacillin-tazobactam (ZOSYN) IVPB 3.375 g (has no administration in time range)    ED Course  I have reviewed the triage vital signs and the nursing notes.  Pertinent labs & imaging results that were available during my care of the patient were reviewed by me and considered in my medical decision making (see chart for details).  Clinical Course as of Apr 24 2026  Nancy Fetter Apr 25, 2019  1847 Collected from right nephrostomy  Urinalysis, Routine w reflex microscopic(!) [[facility]]  1943 Spoke with Dr. Michail Sermon, [facility]. We discussed the patient's symptoms, physical exam findings, vital sign abnormalities, lab abnormalities, and CT abnormalities. He states they will follow with the patient in the morning.  States there is nothing additional we will need to do tonight.   [[facility]]  2027 Spoke with Dr. Maudie Mercury, hospitalist. Agrees to admit the patient. States he will take care of performing chest CT during the patient's admission.   [[facility]]    Clinical Course User Index [[facility]] Lometa Riggin, Helane Gunther, PA-C   MDM Rules/Calculators/A&P                       Patient presents with a primary complaint of left flank pain. Patient is nontoxic appearing, initially afebrile, initially not tachycardic, not tachypneic, not hypotensive, and is in no apparent distress.  Leukocytosis present.  On subsequent vital signs patient then was noted to have mild fever. No kidney abnormalities noted on CT, however, pyelonephritis is still a  possibility.  No hydronephrosis or evidence of obstruction. Evidence of choledocholithiasis without cholecystitis noted on CT and ultrasound.  Elevated liver function tests without elevated lipase.  Cholangitis was a consideration, however, it should be noted that the patient has not had pain or tenderness anywhere on the right side of the abdomen. Patient admitted for additional management.  EKG pending at time of admission.  Lung nodule noted on chest x-ray and CT.  This abnormality was communicated with the patient and his daughter as well as the recommendation for follow-up imaging.  Findings and plan of care discussed with Lennice Sites,  DO. Dr. Ronnald Nian personally evaluated and examined this patient.   Vitals:   04/25/19 1900 04/25/19 1900 04/25/19 1930 04/25/19 1931  BP: 135/65  (!) 136/48   Pulse:      Resp: (!) 31  (!) 29   Temp:  (!) 100.5 F (38.1 C)    TempSrc:  Rectal    SpO2:    100%  Weight:      Height:        Final Clinical Impression(s) / ED Diagnoses Final diagnoses:  Pain  Left flank pain  Fever, unspecified fever cause    Rx / DC Orders ED Discharge Orders    None       Layla Maw 04/25/19 2043    Lennice Sites, DO 04/25/19 2119

## 2019-04-26 ENCOUNTER — Inpatient Hospital Stay (HOSPITAL_COMMUNITY): Payer: Medicare HMO

## 2019-04-26 ENCOUNTER — Encounter (HOSPITAL_COMMUNITY): Payer: Self-pay | Admitting: Internal Medicine

## 2019-04-26 LAB — COMPREHENSIVE METABOLIC PANEL
ALT: 292 U/L — ABNORMAL HIGH (ref 0–44)
AST: 501 U/L — ABNORMAL HIGH (ref 15–41)
Albumin: 2.7 g/dL — ABNORMAL LOW (ref 3.5–5.0)
Alkaline Phosphatase: 157 U/L — ABNORMAL HIGH (ref 38–126)
Anion gap: 10 (ref 5–15)
BUN: 44 mg/dL — ABNORMAL HIGH (ref 8–23)
CO2: 22 mmol/L (ref 22–32)
Calcium: 7.4 mg/dL — ABNORMAL LOW (ref 8.9–10.3)
Chloride: 106 mmol/L (ref 98–111)
Creatinine, Ser: 2.04 mg/dL — ABNORMAL HIGH (ref 0.61–1.24)
GFR calc Af Amer: 31 mL/min — ABNORMAL LOW (ref >60)
GFR calc non Af Amer: 27 mL/min — ABNORMAL LOW (ref >60)
Glucose, Bld: 108 mg/dL — ABNORMAL HIGH (ref 70–99)
Potassium: 4.9 mmol/L (ref 3.5–5.1)
Sodium: 138 mmol/L (ref 135–145)
Total Bilirubin: 3.4 mg/dL — ABNORMAL HIGH (ref 0.3–1.2)
Total Protein: 6 g/dL — ABNORMAL LOW (ref 6.5–8.1)

## 2019-04-26 LAB — BLOOD CULTURE ID PANEL (REFLEXED)

## 2019-04-26 LAB — URINE CULTURE

## 2019-04-26 LAB — HEPATITIS PANEL, ACUTE
HCV Ab: NONREACTIVE
Hep A IgM: NONREACTIVE
Hep B C IgM: NONREACTIVE
Hepatitis B Surface Ag: NONREACTIVE

## 2019-04-26 LAB — CK TOTAL AND CKMB (NOT AT ARMC)
CK, MB: 1.9 ng/mL (ref 0.5–5.0)
Relative Index: INVALID (ref 0.0–2.5)
Total CK: 41 U/L — ABNORMAL LOW (ref 49–397)

## 2019-04-26 LAB — CBC
HCT: 30.7 % — ABNORMAL LOW (ref 39.0–52.0)
Hemoglobin: 9.7 g/dL — ABNORMAL LOW (ref 13.0–17.0)
MCH: 32.3 pg (ref 26.0–34.0)
MCHC: 31.6 g/dL (ref 30.0–36.0)
MCV: 102.3 fL — ABNORMAL HIGH (ref 80.0–100.0)
Platelets: 183 10*3/uL (ref 150–400)
RBC: 3 MIL/uL — ABNORMAL LOW (ref 4.22–5.81)
RDW: 15.5 % (ref 11.5–15.5)
WBC: 21.8 10*3/uL — ABNORMAL HIGH (ref 4.0–10.5)
nRBC: 0 % (ref 0.0–0.2)

## 2019-04-26 LAB — TROPONIN I (HIGH SENSITIVITY)
Troponin I (High Sensitivity): 62 ng/L — ABNORMAL HIGH (ref <18)
Troponin I (High Sensitivity): 59 ng/L — ABNORMAL HIGH (ref <18)
Troponin I (High Sensitivity): 58 ng/L — ABNORMAL HIGH (ref <18)

## 2019-04-26 MED ORDER — SODIUM CHLORIDE 0.9 % IV SOLN: INTRAVENOUS | Status: DC

## 2019-04-26 NOTE — TOC Initial Note (Signed)
Transition of Care [facility]) - Initial/Assessment Note    Patient Details  Name: [name] [name] MRN: [id number] Date of Birth: 08-Feb-1922  Transition of [facility] CM/SW Contact:    Dessa Phi, RN Phone Number: 04/26/2019, 3:11 PM  Clinical Narrative: Damaris Schooner to dtr Garnette Gunner has initiated a conversation with her pcp about hospice @ home for GOC-she would like to continue that conversation.                  Expected Discharge Plan: [place] Barriers to Discharge: Continued Medical Work up   Patient Goals and CMS Choice        Expected Discharge Plan and Services Expected Discharge Plan: [place]   Discharge Planning Services: CM Consult   Living arrangements for the past 2 months: Single Family Home                                      Prior Living Arrangements/Services Living arrangements for the past 2 months: Single Family Home Lives with:: Self(dtr lives next door) Patient language and need for interpreter reviewed:: Yes        Need for Family Participation in Patient Care: No (Comment) Care giver support system in place?: Yes (comment) Current home services: DME(cane,rw,grab bars,shower chair) Criminal Activity/Legal Involvement Pertinent to Current Situation/Hospitalization: No - Comment as needed  Activities of Daily Living Home Assistive Devices/Equipment: Hearing aid, Cane (specify quad or straight), Eyeglasses, Ostomy supplies, Raised toilet seat with rails, Grab bars in shower, Grab bars around toilet ADL Screening (condition at time of admission) Patient's cognitive ability adequate to safely complete daily activities?: Yes Is the patient deaf or have difficulty hearing?: Yes Does the patient have difficulty seeing, even when wearing glasses/contacts?: No Does the patient have difficulty concentrating, remembering, or making decisions?: No Patient able to express need for assistance with ADLs?: Yes Does the  patient have difficulty dressing or bathing?: No Independently performs ADLs?: Yes (appropriate for developmental age) Does the patient have difficulty walking or climbing stairs?: Yes Weakness of Legs: Both Weakness of Arms/Hands: Both  Permission Sought/Granted Permission sought to share information with : Case Manager Permission granted to share information with : Yes, Verbal Permission Granted  Share Information with NAME: Case Manager  Permission granted to share info w AGENCY: [place] agencies  Permission granted to share info w Relationship: dtr [name] [phone]     Emotional Assessment Appearance:: Appears stated age Attitude/Demeanor/Rapport: Gracious Affect (typically observed): Accepting Orientation: : Oriented to Self, Oriented to Place, Oriented to  Time, Oriented to Situation Alcohol / Substance Use: Not Applicable Psych Involvement: No (comment)  Admission diagnosis:  Pain [R52] Left flank pain [R10.9] Sepsis ([place]) [A41.9] Fever, unspecified fever cause [R50.9] Patient Active Problem List   Diagnosis Date Noted  . Abnormal liver function 04/25/2019  . Acute on chronic diastolic CHF (congestive heart failure) ([place]) 12/09/2018  . CHF exacerbation ([place]) 12/09/2018  . Malfunction of nephrostomy tube ([place]) 06/13/2017  . HTN (hypertension) 06/13/2017  . Gout 06/13/2017  . Legally blind 06/13/2017  . Bladder cancer ([place]) 06/13/2017  . Blood poisoning   . Bacteremia due to group B Streptococcus 02/07/2014  . Sepsis ([place]) 02/04/2014  . Acute lower UTI 07/30/2013  . ARF (acute renal failure) ([place]) 07/29/2013  . CKD (chronic kidney disease), stage IV ([place]) 07/29/2013  . Fever, unspecified 07/29/2013  .  Renal stone 07/27/2013  . Hydronephrosis, right 07/15/2013  . Infection of urinary tract 07/15/2013  . Hyperkalemia 07/15/2013  . Kidney stone 08/20/2011  . Obstructive uropathy 08/16/2011  . Acute renal failure ([place]) 08/16/2011  . Leukocytosis 08/16/2011  .  Gout 08/16/2011  . Aortic stenosis 08/16/2011  . Hypertension 10/02/2010  . Pacemaker 10/02/2010  . Sinus bradycardia 10/02/2010   PCP:  Crist Infante, MD Pharmacy:   [place], [address] [address] Phone: [phone] Fax: [phone]     Social Determinants of Health (SDOH) Interventions    Readmission Risk Interventions Readmission Risk Prevention Plan 04/26/2019  Transportation Screening Complete  PCP or Specialist Appt within 3-5 Days Complete  HRI or [place] Complete  Social Work Consult for [place] Planning/Counseling Complete  Palliative Care Screening Complete  Medication Review Press photographer) Complete  Some recent data might be hidden

## 2019-04-26 NOTE — Consult Note (Signed)
Referring Provider: Dr. Verlon Au Primary Care Physician:  Crist Infante, MD Primary Gastroenterologist:  Althia Forts  Reason for Consultation:  Choledocholithiasis  HPI: [name] [name] is a 86 y.o. male with multiple medical problems as stated below who was admitted for possible pyelonephritis with left flank pain but found to have elevated LFTs with TB 3.4, ALP 157, AST 501, ALT 292, Lipase 33 and non-contrast CT showed CBD stones and intrahepatic and extrahepatic biliary dilation. He has an ileal conduit following bladder cancer in the past.  Past Medical History:  Diagnosis Date  . Aortic valve stenosis    a. mild to moderate by echo 2013  . Bradycardia    MDT dual chamber pacemaker  . Cancer [facility])    bladder cancer  . Chronic kidney disease   . Deep venous thrombosis (HCC)    hx of  LEFT ARM AFTER PACEMAKER INSERTION ABOUT 6 YRS AGO  . Depression   . Gout   . Hypertension   . Kidney stone   . Macular degeneration    PT STATES HIS VISION STILL OKAY FOR DRIVING  . Osteoporosis   . Prostate cancer ([place])    AND BLADDER CANCER - S/P URETEROILEAL CONDUIT  . Renal disorder   . S/P ileal conduit [facility])         Past Surgical History:  Procedure Laterality Date  . 07/20/13  CONVERSION OF RIGHT SIDED NEPHROSTOMY CATHETER TO RIGHT SIDED NEPHRO URETERAL CATHETER - DONE IN INTERVENTIONAL RADIOLOGY    . APPENDECTOMY    . CATARACT EXTRACTION, BILATERAL    . CYSTECTOMY    . ilial conduit     for bladder cancer  . IR GENERIC HISTORICAL  10/26/2015   IR NEPHROSTOMY TUBE CHANGE 10/26/2015 Jacqulynn Cadet, MD [facility]-INTERV RAD  . IR GENERIC HISTORICAL  12/07/2015   IR NEPHROSTOMY TUBE CHANGE 12/07/2015 Greggory Keen, MD [facility]-INTERV RAD  . IR GENERIC HISTORICAL  01/11/2016   IR NEPHROSTOMY TUBE CHANGE 01/11/2016 Greggory Keen, MD [facility]INTERV [facility]  . IR GENERIC HISTORICAL  02/08/2016   IR NEPHROSTOMY TUBE CHANGE 02/08/2016 [facility]  . IR GENERIC HISTORICAL  03/14/2016   IR NEPHROSTOMY TUBE  CHANGE 03/14/2016 Jacqulynn Cadet, MD [facility]INTER[facility]  . IR GENERIC HISTORICAL  04/22/2016   IR NEPHROSTOMY TUBE CHANGE 04/22/2016 Aletta Edouard, MD [facility]INTERV RAD  . IR GENERIC HISTORICAL  05/27/2016   IR NEPHROSTOMY TUBE CHANGE 05/27/2016 Arne Cleveland, MD [facility]-INTERV RAD  . IR NEPHROSTOMY EXCHANGE RIGHT  07/25/2017  . IR NEPHROSTOMY EXCHANGE RIGHT  10/13/2017  . IR NEPHROSTOMY EXCHANGE RIGHT  11/18/2017  . IR NEPHROSTOMY EXCHANGE RIGHT  12/30/2017  . IR NEPHROSTOMY EXCHANGE RIGHT  02/10/2018  . IR NEPHROSTOMY EXCHANGE RIGHT  03/23/2018  . IR NEPHROSTOMY EXCHANGE RIGHT  05/04/2018  . IR NEPHROSTOMY EXCHANGE RIGHT  06/25/2018  . IR NEPHROSTOMY EXCHANGE RIGHT  08/13/2018  . IR NEPHROSTOMY EXCHANGE RIGHT  10/01/2018  . IR NEPHROSTOMY EXCHANGE RIGHT  11/19/2018  . IR NEPHROSTOMY EXCHANGE RIGHT  01/05/2019  . IR NEPHROSTOMY EXCHANGE RIGHT  04/01/2019  . IR NEPHROSTOMY PLACEMENT RIGHT  06/13/2017  . IR NEPHROSTOMY PLACEMENT RIGHT  02/23/2019  . IR NEPHROSTOMY TUBE CHANGE  07/01/2016  . IR NEPHROSTOMY TUBE CHANGE  08/05/2016  . IR NEPHROSTOMY TUBE CHANGE  09/18/2016  . IR NEPHROSTOMY TUBE CHANGE  10/24/2016  . IR NEPHROSTOMY TUBE CHANGE  11/18/2016  . IR NEPHROSTOMY TUBE CHANGE  12/30/2016  . IR NEPHROSTOMY TUBE CHANGE  02/03/2017  . IR NEPHROSTOMY TUBE CHANGE  03/13/2017  .  IR NEPHROSTOMY TUBE CHANGE  04/17/2017  . IR NEPHROSTOMY TUBE CHANGE  05/28/2017  . IR NEPHROSTOMY TUBE CHANGE  09/04/2017  . KIDNEY STONE SURGERY    . LYMPHADENECTOMY    . NEPHROLITHOTOMY  09/02/2011   Procedure: NEPHROLITHOTOMY PERCUTANEOUS;  Surgeon: Malka So, MD;  Location: [facility] ORS;  Service: Urology;  Laterality: Left;  . NEPHROLITHOTOMY Right 07/27/2013   Procedure: RIGHT PERCUTANEOUS NEPHROLITHOTOMY ;  Surgeon: Irine Seal, MD;  Location: [facility] ORS;  Service: Urology;  Laterality: Right;  . PACEMAKER INSERTION  2009   MDT dual chamber pacemaker implanted by Dr Verlon Setting  . PROSTATE SURGERY      Prior to Admission medications   Medication Sig  Start Date End Date Taking? Authorizing Provider  acetaminophen (TYLENOL) 500 MG tablet Take 1,000 mg by mouth every 8 (eight) hours as needed for mild pain or headache.   Yes [provider]  aspirin EC 81 MG tablet Take 81 mg by mouth every morning.   Yes [provider]  Besifloxacin HCl (BESIVANCE) 0.6 % SUSP Apply 1 drop to eye See admin instructions. Only uses before eye shot he receives every 10 weeks.   Yes [provider]  Cholecalciferol (VITAMIN D-3) 1000 UNITS CAPS Take 1 capsule by mouth every morning.   Yes [provider]  colchicine 0.6 MG tablet Take 0.6 mg by mouth as needed (gout).    Yes [provider]  docusate sodium (COLACE) 100 MG capsule Take 300 mg by mouth daily.    Yes [provider]  furosemide (LASIX) 20 MG tablet Take 1 tablet (20 mg total) by mouth daily. 12/16/18 12/11/19 Yes Burnell Blanks, MD  Hyprom-Naphaz-Polysorb-Zn Sulf (CLEAR EYES COMPLETE OP) Apply 1-2 drops to eye daily as needed (dry eyes).   Yes [provider]  levothyroxine (SYNTHROID) 25 MCG tablet Take 25 mcg by mouth every morning. 04/07/19  Yes [provider]  metoprolol succinate (TOPROL-XL) 12.5 mg TB24 24 hr tablet Take 12.5 mg by mouth daily.   Yes [provider]  Multiple Vitamins-Minerals (VITEYES AREDS ADVANCED PO) Take 2 capsules by mouth daily.   Yes [provider]  OMEGA-3 KRILL OIL PO Take 1 tablet by mouth every morning.   Yes [provider]  polyethylene glycol powder (GLYCOLAX/MIRALAX) 17 GM/SCOOP powder Take 17 g by mouth daily as needed for mild constipation. Takes 17grams (1 capful) as needed. 12/12/18  Yes Mariel Aloe, MD  ULORIC 40 MG tablet Take 1 tablet by mouth daily. 11/24/15  Yes [provider]  metoprolol succinate (TOPROL-XL) 25 MG 24 hr tablet Take 1 tablet (25 mg total) by mouth daily. Patient not taking: Reported on 04/25/2019 12/13/18   Mariel Aloe,  MD    Scheduled Meds: . aspirin EC  81 mg Oral Daily  . cholecalciferol  1,000 Units Oral Daily  . docusate sodium  300 mg Oral Daily  . enoxaparin (LOVENOX) injection  30 mg Subcutaneous QHS  . febuxostat  40 mg Oral Daily  . furosemide  20 mg Oral QODAY  . levothyroxine  25 mcg Oral Q0600  . metoprolol succinate  12.5 mg Oral Daily  . multivitamin  1 tablet Oral Daily   Continuous Infusions: . piperacillin-tazobactam (ZOSYN)  IV Stopped (04/26/19 0234)  . [START ON 04/27/2019] vancomycin     PRN Meds:.colchicine, ondansetron (ZOFRAN) IV, polyethylene glycol, polyvinyl alcohol, traMADol  Allergies as of 04/25/2019  . (No Known Allergies)    Family History  Problem  Relation Age of Onset  . Pancreatic cancer Father 58       died  . Pancreatic cancer Mother 72       died    Social History   Socioeconomic History  . Marital status: Widowed    Spouse name: Not on file  . Number of children: 3  . Years of education: Not on file  . Highest education level: Not on file  Occupational History  . Occupation: IT sales professional: RETIRED  Tobacco Use  . Smoking status: Former Smoker    Types: Cigarettes    Quit date: 03/25/1952    Years since quitting: 67.1  . Smokeless tobacco: Never Used  Substance and Sexual Activity  . Alcohol use: No  . Drug use: No  . Sexual activity: Never  Other Topics Concern  . Not on file  Social History Narrative   ** Merged History Encounter **       Social Determinants of Health   Financial Resource Strain:   . Difficulty of Paying Living Expenses: Not on file  Food Insecurity:   . Worried About Charity fundraiser in the Last Year: Not on file  . Ran Out of Food in the Last Year: Not on file  Transportation Needs:   . Lack of Transportation (Medical): Not on file  . Lack of Transportation (Non-Medical): Not on file  Physical Activity:   . Days of Exercise per Week: Not on file  . Minutes of Exercise per Session: Not on file   Stress:   . Feeling of Stress : Not on file  Social Connections:   . Frequency of Communication with Friends and Family: Not on file  . Frequency of Social Gatherings with Friends and Family: Not on file  . Attends Religious Services: Not on file  . Active Member of Clubs or Organizations: Not on file  . Attends Archivist Meetings: Not on file  . Marital Status: Not on file  Intimate Partner Violence:   . Fear of Current or Ex-Partner: Not on file  . Emotionally Abused: Not on file  . Physically Abused: Not on file  . Sexually Abused: Not on file    Review of Systems: All negative except as stated above in HPI.  Physical Exam: Vital signs: Vitals:   04/26/19 0525 04/26/19 0940  BP: 129/60   Pulse: (!) 59 62  Resp: (!) 24   Temp: 97.9 F (36.6 C)   SpO2: 98%    Last BM Date: 04/24/19 General:   Lethargic, hard of hearing, elderly, thin, no acute distress  Head: normocephalic, atraumatic Eyes: +icteric sclera ENT: oropharynx clear Neck: supple, nontender Lungs:  Clear throughout to auscultation.   No wheezes, crackles, or rhonchi. No acute distress. Heart:  Regular rate and rhythm; no murmurs, clicks, rubs,  or gallops. Abdomen: LLQ tenderness with guarding, suprapubic ileal conduit bag, soft, nondistended, +BS  Rectal:  Deferred Ext: no edema  GI:  Lab Results: Recent Labs    04/25/19 1749 04/26/19 0425  WBC 15.2* 21.8*  HGB 11.5* 9.7*  HCT 36.0* 30.7*  PLT 231 183   BMET Recent Labs    04/25/19 1749 04/26/19 0425  NA 139 138  K 4.9 4.9  CL 105 106  CO2 26 22  GLUCOSE 98 108*  BUN 43* 44*  CREATININE 2.03* 2.04*  CALCIUM 8.5* 7.4*   LFT Recent Labs    04/25/19 1749 04/25/19 1749 04/26/19 0425  PROT 7.5   < >  6.0*  ALBUMIN 3.5   < > 2.7*  AST 487*   < > 501*  ALT 227*   < > 292*  ALKPHOS 184*   < > 157*  BILITOT 2.4*   < > 3.4*  BILIDIR 1.5*  --   --   IBILI 0.9  --   --    < > = values in this interval not displayed.    PT/INR No results for input(s): LABPROT, INR in the last 72 hours.   Studies/Results: DG Chest Portable 1 View  Result Date: 04/25/2019 CLINICAL DATA:  Shortness of breath. Bradycardia. EXAM: PORTABLE CHEST 1 VIEW COMPARISON:  12/09/2018 FINDINGS: Mild cardiomegaly remains stable. Pacemaker remains in place. Previously seen bibasilar pulmonary opacity is resolved since previous study. A nodular opacity is seen in the lateral right lung base. IMPRESSION: 1. Nodular opacity in lateral right lung base. Recommend chest CT to exclude pulmonary nodule. 2. Cardiomegaly. Electronically Signed   By: Marlaine Hind M.D.   On: 04/25/2019 17:11   CT RENAL STONE STUDY  Result Date: 04/25/2019 CLINICAL DATA:  Flank pain with kidney stones suspected EXAM: CT ABDOMEN AND PELVIS WITHOUT CONTRAST TECHNIQUE: Multidetector CT imaging of the abdomen and pelvis was performed following the standard protocol without IV contrast. COMPARISON:  June 25, 2017 FINDINGS: Lower chest: There is a moderate to large right-sided pleural effusion that is only partially visualized on this exam. There is suggestion of a 1.9 cm subpleural nodule involving the right middle lobe (axial series 2, image 6).The heart is enlarged. There is a small pericardial effusion. Hepatobiliary: The liver is normal. Cholelithiasis without acute inflammation.Multiple stones are noted in the common bile duct. There is mild biliary ductal dilatation with the common bile duct measuring up to 1.4 cm. Pancreas: Normal contours without ductal dilatation. No peripancreatic fluid collection. Spleen: No splenic laceration or hematoma. Adrenals/Urinary Tract: --Adrenal glands: No adrenal hemorrhage. --Right kidney/ureter: There is a right-sided percutaneous nephrostomy tube. There is no evidence for hydronephrosis. The nephrostomy tube appears somewhat kinked but is likely functional given the lack of hydronephrosis. There is mild dilatation of the right ureter. --Left  kidney/ureter: No evidence for hydronephrosis. There is a stone in the lower pole measuring approximately 1.4 cm. The patient may be status post prior left renal ablation. --Urinary bladder: The urinary bladder is surgically absent. An ileal conduit is noted. Stomach/Bowel: --Stomach/Duodenum: The stomach is moderately distended. --Small bowel: No dilatation or inflammation. --Colon: Rectosigmoid diverticulosis without acute inflammation. --Appendix: Not visualized. No right lower quadrant inflammation or free fluid. Vascular/Lymphatic: Atherosclerotic calcification is present within the non-aneurysmal abdominal aorta, without hemodynamically significant stenosis. --No retroperitoneal lymphadenopathy. --No mesenteric lymphadenopathy. --there are bilateral fat containing inguinal hernias, left greater than right. There is a low midline ventral wall hernia containing a loop of small bowel without evidence for obstruction (axial series 2, image 68). Reproductive: Prostate gland is surgically absent. Other: No ascites or free air. The abdominal wall is normal. Musculoskeletal. No acute displaced fractures. IMPRESSION: 1. No specific abnormality to explain the patient's left flank pain. There is no left-sided hydronephrosis. There is a 1.4 cm nonobstructing stone in lower pole the left kidney. 2. Moderate to large right-sided pleural effusion. Small left-sided pleural effusion. 3. Cholelithiasis with CT evidence for choledocholithiasis. Multiple stones are noted in the common bile duct of both proximally and distally. The common bile duct is dilated and there is mild intrahepatic biliary ductal dilatation. There is no CT evidence for acute calculus cholecystitis. 4. Right-sided  percutaneous nephrostomy tube in place. While the tube appears slightly tortuous and possibly kinked, the tube appears to be functioning appropriately as there is no right-sided hydronephrosis. 5. Findings concerning for 1.9 cm mass in the right  middle lobe. Follow-up with a nonemergent contrast enhanced CT of the chest is recommended for further evaluation of this finding. 6. Multiple additional chronic findings as detailed above. Aortic Atherosclerosis (ICD10-I70.0). Electronically Signed   By: Constance Holster M.D.   On: 04/25/2019 18:50   US Abdomen Limited RUQ  Result Date: 04/25/2019 CLINICAL DATA:  86 year old male with right upper quadrant abdominal pain for 1 week. Cholelithiasis, choledocholithiasis, and right pleural effusion on CT Abdomen and Pelvis today. EXAM: ULTRASOUND ABDOMEN LIMITED RIGHT UPPER QUADRANT COMPARISON:  CT Abdomen and Pelvis 1818 hours today. FINDINGS: Gallbladder: Gallbladder wall thickness remains normal at 1 to 2 millimeters. Echogenic gallstones individually estimated at 14 millimeters (image 12). No pericholecystic fluid. No sonographic Murphy sign elicited. Common bile duct: Diameter: 13 millimeters, dilated. Cannot visualize the filling defects within the ducts seen by CT today. Liver: Evidence of intrahepatic biliary ductal dilatation on image 48. Background liver echogenicity within normal limits. No discrete liver lesion. Portal vein is patent on color Doppler imaging with normal direction of blood flow towards the liver. Other: Right pleural effusion redemonstrated. IMPRESSION: 1. Dilated intra- and extrahepatic biliary tree due to the multifocal Choledocholithiasis demonstrated by CT today. 2. Cholelithiasis, no superimposed acute cholecystitis. 3. Right pleural effusion redemonstrated. Electronically Signed   By: Genevie Ann M.D.   On: 04/25/2019 19:50    Impression/Plan: 86 yo with elevated LFTs and biliary dilation with CBD stones in common bile duct on a non-contrast CT scan. AST/ALT rising. On IV Abx. ERCP needed for stone extraction and sphincterotomy. Plan for ERCP tomorrow by Dr. Watt Climes.    LOS: 1 day   [name]  04/26/2019, 10:00 AM  Questions please call [phone]

## 2019-04-26 NOTE — Progress Notes (Signed)
Hospitalist progress note   Patient from home, Patient going unclear, Dispo pending clinical improvement, testing per GI  [name] [name] [id number] DOB: 1921-05-09 DOA: 04/25/2019  PCP: Crist Infante, MD   Narrative:  51 white male prostate bladder cancer 2006+ ileal conduit/urethral stricture, sinus node dysfunction status post PPM dual-chamber [employer] initially 11/2007, AOS-severe surveillance gout, history of DVT Presented to [facility] ED fever left flank pain 1/31 slight S OB-work-up showed 1.4 nonobstructing left kidney stone moderate large right pleural effusion Cholelithiasis with choledocholithiasis, 1.9 cm mass right middle lobe Admitted for sepsis question secondary UTI, abnormal liver enzymes?  Cholelithiasis right lung mass ATC  Data Reviewed:  BUN/creatinine 43/2.03 AST/ALT 47/227-->501/292 Bilirubin 2.4-->3.4 BNP 2136 White count 15.2-->21.8 hemoglobin 11.5-->9.7 CT images reviewed  Assessment & Plan:  ?  Pyelonephritis ?  Choledocholithiasis Currently is n.p.o. pending input from gastroenterology Might require ERCP It is possible that he has choledocholithiasis in addition to left-sided pyelonephritis 1.4 cm stone is nonobstructing Right pleural effusion On exam today I do not hear any fremitus resonance with dull percussion note-we will repeat a chest x-ray if there are concerns but he is not complaining of cough Repeat CXR in a.m. AOS Sick sinus syndrome + PPM Slightly elevated troponin 49-62 Could be mediated by infection-he is not complaining of any chest pain at this time-would continue to trend and monitor and if something changes would get definitive consultant input Continue Lasix 20 started on metoprolol 12.5 XL aspirin 81 mg Likely dilutional anemia, macrocytic May require outpatient work-up for anemia as it is stable at this time hold Hypothyroid Continue levothyroxine 25 mcg-outpatient follow-up Gout Continue Uloric 40 daily, colchicine 0.6 as  needed Bladder/prostate CA with ileal conduit right-sided   Subjective: Awake alert quite hard of hearing pleasant better no distress Tells me pain is in the left side only minor question does he admit to some right-sided discomfort No chest pain blurred vision double vision diarrhea burning in the urine unilateral weakness  Consultants:   Gastroenterology  Urology  Objective: Vitals:   04/25/19 2149 04/25/19 2233 04/26/19 0500 04/26/19 0525  BP:  (!) 129/42  129/60  Pulse:  (!) 48  (!) 59  Resp:  (!) 24  (!) 24  Temp: 99.5 F (37.5 C) 98.5 F (36.9 C)  97.9 F (36.6 C)  TempSrc: Oral Oral  Oral  SpO2:  96%  98%  Weight:  69.2 kg 70.2 kg   Height:  5\' 7"  (1.702 m)      Intake/Output Summary (Last 24 hours) at 04/26/2019 0725 Last data filed at 04/26/2019 0600 Gross per 24 hour  Intake 887.82 ml  Output 375 ml  Net 512.82 ml   Filed Weights   04/25/19 1532 04/25/19 2233 04/26/19 0500  Weight: 68.9 kg 69.2 kg 70.2 kg    Examination: Awake coherent no distress EOMI NCAT no focal deficit S1-S2 no murmur rub or gallop Chest is clinically clear without added sound no rales no rhonchi-none not able to appreciate either fremitus or resonance or dollar percussion note on either side Slightly tender on the right upper quadrant as well as in the left upper quadrant-Urostomy present-smiling umbilicus Right-sided ileostomy bag Moving all 4 limbs equally without focal deficit He does have a pacemaker in place   Scheduled Meds: . aspirin EC  81 mg Oral Daily  . cholecalciferol  1,000 Units Oral Daily  . docusate sodium  300 mg Oral Daily  . enoxaparin (LOVENOX) injection  30 mg Subcutaneous QHS  .  febuxostat  40 mg Oral Daily  . furosemide  20 mg Oral QODAY  . levothyroxine  25 mcg Oral Q0600  . metoprolol succinate  12.5 mg Oral Daily  . multivitamin  1 tablet Oral Daily   Continuous Infusions: . piperacillin-tazobactam (ZOSYN)  IV Stopped (04/26/19 0234)  . [START ON  04/27/2019] vancomycin       LOS: 1 day   Time spent: Roberts, MD [facility]  04/26/2019, 7:25 AM

## 2019-04-26 NOTE — Progress Notes (Signed)
PHARMACY - PHYSICIAN COMMUNICATION CRITICAL VALUE ALERT - BLOOD CULTURE IDENTIFICATION (BCID)  CULLY LUCKOW is an 86 y.o. male who presented to [facility] on 04/25/2019 with a chief complaint of left flank pain ? pyelo  Assessment: 1 of 4 blood culture bottles - GPC - presumed contaminant  Name of physician (or Provider) Contacted: n/a  Current antibiotics: Zosyn/Vanc  Changes to prescribed antibiotics recommended:  No changes  No results found for this or any previous visit.  Kara Mead 04/26/2019  3:13 PM

## 2019-04-26 NOTE — Progress Notes (Signed)
PHARMACY - PHYSICIAN COMMUNICATION CRITICAL VALUE ALERT - BLOOD CULTURE IDENTIFICATION (BCID)  [name] [name] is an 86 y.o. male who presented to [facility] on 04/25/2019 with a chief complaint of - see previous note. Further speciation now of blood cultures  Assessment:  3 of 4 blood cx bottles growing enterococcus  Name of physician (or Provider) Contacted: [name] [name]  Current antibiotics: Vanc/Zosyn  Changes to prescribed antibiotics recommended:  Discussed with Md, plan to d/c vanc and continue Zosyn for now as Md wants continued anaerobic coverage for now  Results for orders placed or performed during the hospital encounter of 04/25/19  Blood Culture ID Panel (Reflexed) (Collected: 04/25/2019  8:33 PM)  Result Value Ref Range   Enterococcus species DETECTED (A) NOT DETECTED   Vancomycin resistance NOT DETECTED NOT DETECTED   Listeria monocytogenes NOT DETECTED NOT DETECTED   Staphylococcus species NOT DETECTED NOT DETECTED   Staphylococcus aureus (BCID) NOT DETECTED NOT DETECTED   Streptococcus species NOT DETECTED NOT DETECTED   Streptococcus agalactiae NOT DETECTED NOT DETECTED   Streptococcus pneumoniae NOT DETECTED NOT DETECTED   Streptococcus pyogenes NOT DETECTED NOT DETECTED   Acinetobacter baumannii NOT DETECTED NOT DETECTED   Enterobacteriaceae species NOT DETECTED NOT DETECTED   Enterobacter cloacae complex NOT DETECTED NOT DETECTED   Escherichia coli NOT DETECTED NOT DETECTED   Klebsiella oxytoca NOT DETECTED NOT DETECTED   Klebsiella pneumoniae NOT DETECTED NOT DETECTED   Proteus species NOT DETECTED NOT DETECTED   Serratia marcescens NOT DETECTED NOT DETECTED   Haemophilus influenzae NOT DETECTED NOT DETECTED   Neisseria meningitidis NOT DETECTED NOT DETECTED   Pseudomonas aeruginosa NOT DETECTED NOT DETECTED   Candida albicans NOT DETECTED NOT DETECTED   Candida glabrata NOT DETECTED NOT DETECTED   Candida krusei NOT DETECTED NOT DETECTED   Candida  parapsilosis NOT DETECTED NOT DETECTED   Candida tropicalis NOT DETECTED NOT DETECTED    Kara Mead 04/26/2019  5:54 PM

## 2019-04-26 NOTE — Evaluation (Signed)
Physical Therapy Evaluation Patient Details Name: [name] [name] MRN: [id number] DOB: 11-16-1921 Today's Date: 04/26/2019   History of Present Illness  86 yo male admitted with UTI, possible cholelithiasis. Hx of aortic stenosis, CHF, pacemaker, prostate, Ca, bladder Ca, macular degeneration, DVT  Clinical Impression  On eval, pt was Min guard for mobility. He walked ~115 feet with use of a RW. At baseline, he normally uses a cane. Daughter was present during session. Discussed d/c plan-per daughter, plan is for pt to return home. She mentioned that they are planning a consult with palliative care. Will follow and progress activity as tolerated.     Follow Up Recommendations Home health PT;Supervision - Intermittent(if pt/family decide they want f/u)    Equipment Recommendations  None recommended by PT    Recommendations for Other Services       Precautions / Restrictions Precautions Precautions: Fall Precaution Comments: 2 drains Restrictions Weight Bearing Restrictions: No      Mobility  Bed Mobility Overal bed mobility: Needs Assistance Bed Mobility: Supine to Sit     Supine to sit: Min guard;HOB elevated     General bed mobility comments: close guard for safety. increased time and effort for pt  Transfers Overall transfer level: Needs assistance Equipment used: Rolling walker (2 wheeled) Transfers: Sit to/from Stand Sit to Stand: Min guard;From elevated surface         General transfer comment: Close guard for safety. Vcs safety, hand placement.  Ambulation/Gait Ambulation/Gait assistance: Min guard Gait Distance (Feet): 115 Feet Assistive device: Rolling walker (2 wheeled) Gait Pattern/deviations: Step-through pattern;Decreased stride length     General Gait Details: Close guard for safety. Pt tolerated distance well.  Stairs            Wheelchair Mobility    Modified Rankin (Stroke Patients Only)       Balance Overall balance  assessment: Needs assistance         Standing balance support: Bilateral upper extremity supported Standing balance-Leahy Scale: Poor                               Pertinent Vitals/Pain Pain Assessment: No/denies pain    Home Living Family/patient expects to be discharged to:: Private residence Living Arrangements: Alone Available Help at Discharge: Family;Available PRN/intermittently(daughter lives next door) Type of Home: House       Home Layout: Two level;Able to live on main level with bedroom/bathroom Home Equipment: Gilford Rile - 2 wheels;Cane - single point;Shower seat      Prior Function Level of Independence: Needs assistance   Gait / Transfers Assistance Needed: uses cane for ambulation. daughter walks with him outside daily for exercise  ADL's / Homemaking Assistance Needed: assist for bathing LEs. Meals on wheels.        Hand Dominance        Extremity/Trunk Assessment   Upper Extremity Assessment Upper Extremity Assessment: Generalized weakness    Lower Extremity Assessment Lower Extremity Assessment: Generalized weakness    Cervical / Trunk Assessment Cervical / Trunk Assessment: Kyphotic  Communication   Communication: HOH  Cognition Arousal/Alertness: Awake/alert Behavior During Therapy: WFL for tasks assessed/performed Overall Cognitive Status: Within Functional Limits for tasks assessed                                        General Comments  Exercises     Assessment/Plan    PT Assessment Patient needs continued PT services  PT Problem List Decreased strength;Decreased mobility;Decreased balance;Decreased knowledge of use of DME;Decreased activity tolerance       PT Treatment Interventions DME instruction;Gait training;Therapeutic activities;Therapeutic exercise;Patient/family education;Functional mobility training;Balance training    PT Goals (Current goals can be found in the Care Plan section)   Acute Rehab PT Goals Patient Stated Goal: home PT Goal Formulation: With patient/family Time For Goal Achievement: 05/10/19 Potential to Achieve Goals: Good    Frequency Min 3X/week   Barriers to discharge        Co-evaluation               AM-PAC PT "6 Clicks" Mobility  Outcome Measure Help needed turning from your back to your side while in a flat bed without using bedrails?: A Little Help needed moving from lying on your back to sitting on the side of a flat bed without using bedrails?: A Little Help needed moving to and from a bed to a chair (including a wheelchair)?: A Little Help needed standing up from a chair using your arms (e.g., wheelchair or bedside chair)?: A Little Help needed to walk in hospital room?: A Little Help needed climbing 3-5 steps with a railing? : A Little 6 Click Score: 18    End of Session Equipment Utilized During Treatment: Gait belt Activity Tolerance: Patient tolerated treatment well Patient left: in chair;with family/visitor present;with call bell/phone within reach   PT Visit Diagnosis: Unsteadiness on feet (R26.81);Muscle weakness (generalized) (M62.81)    Time: 4734-0370 PT Time Calculation (min) (ACUTE ONLY): 10 min   Charges:   PT Evaluation $PT Eval Moderate Complexity: 1 Mod             Shatima Zalar P, PT Acute Rehabilitation

## 2019-04-27 ENCOUNTER — Encounter (HOSPITAL_COMMUNITY)
Admission: EM | Disposition: A | Discharge status: Home Health | Payer: Self-pay | Source: Home / Self Care | Attending: Internal Medicine

## 2019-04-27 ENCOUNTER — Inpatient Hospital Stay (HOSPITAL_COMMUNITY): Payer: Medicare HMO

## 2019-04-27 ENCOUNTER — Inpatient Hospital Stay (HOSPITAL_COMMUNITY): Payer: Medicare HMO | Admitting: Certified Registered"

## 2019-04-27 ENCOUNTER — Encounter (HOSPITAL_COMMUNITY): Payer: Self-pay | Admitting: Internal Medicine

## 2019-04-27 ENCOUNTER — Other Ambulatory Visit (HOSPITAL_COMMUNITY): Payer: Medicare HMO

## 2019-04-27 DIAGNOSIS — K269 Duodenal ulcer, unspecified as acute or chronic, without hemorrhage or perforation: Secondary | ICD-10-CM | POA: Diagnosis not present

## 2019-04-27 DIAGNOSIS — D539 Nutritional anemia, unspecified: Secondary | ICD-10-CM | POA: Diagnosis present

## 2019-04-27 DIAGNOSIS — Z87891 Personal history of nicotine dependence: Secondary | ICD-10-CM

## 2019-04-27 DIAGNOSIS — J90 Pleural effusion, not elsewhere classified: Secondary | ICD-10-CM

## 2019-04-27 DIAGNOSIS — R7881 Bacteremia: Secondary | ICD-10-CM

## 2019-04-27 DIAGNOSIS — Z8551 Personal history of malignant neoplasm of bladder: Secondary | ICD-10-CM

## 2019-04-27 DIAGNOSIS — Z978 Presence of other specified devices: Secondary | ICD-10-CM

## 2019-04-27 DIAGNOSIS — Z8546 Personal history of malignant neoplasm of prostate: Secondary | ICD-10-CM

## 2019-04-27 DIAGNOSIS — Z936 Other artificial openings of urinary tract status: Secondary | ICD-10-CM

## 2019-04-27 DIAGNOSIS — K3189 Other diseases of stomach and duodenum: Secondary | ICD-10-CM | POA: Diagnosis not present

## 2019-04-27 DIAGNOSIS — R011 Cardiac murmur, unspecified: Secondary | ICD-10-CM

## 2019-04-27 DIAGNOSIS — B952 Enterococcus as the cause of diseases classified elsewhere: Secondary | ICD-10-CM

## 2019-04-27 DIAGNOSIS — Z950 Presence of cardiac pacemaker: Secondary | ICD-10-CM

## 2019-04-27 DIAGNOSIS — Z8679 Personal history of other diseases of the circulatory system: Secondary | ICD-10-CM

## 2019-04-27 DIAGNOSIS — H9190 Unspecified hearing loss, unspecified ear: Secondary | ICD-10-CM

## 2019-04-27 DIAGNOSIS — I350 Nonrheumatic aortic (valve) stenosis: Secondary | ICD-10-CM

## 2019-04-27 DIAGNOSIS — K8050 Calculus of bile duct without cholangitis or cholecystitis without obstruction: Secondary | ICD-10-CM

## 2019-04-27 HISTORY — PX: SPHINCTEROTOMY: SHX5544

## 2019-04-27 HISTORY — PX: ESOPHAGOGASTRODUODENOSCOPY: SHX5428

## 2019-04-27 HISTORY — PX: ERCP: SHX5425

## 2019-04-27 HISTORY — PX: BALLOON DILATION: SHX5330

## 2019-04-27 HISTORY — PX: REMOVAL OF STONES: SHX5545

## 2019-04-27 LAB — COMPREHENSIVE METABOLIC PANEL
ALT: 196 U/L — ABNORMAL HIGH (ref 0–44)
AST: 231 U/L — ABNORMAL HIGH (ref 15–41)
Albumin: 2.6 g/dL — ABNORMAL LOW (ref 3.5–5.0)
Alkaline Phosphatase: 137 U/L — ABNORMAL HIGH (ref 38–126)
Anion gap: 10 (ref 5–15)
BUN: 43 mg/dL — ABNORMAL HIGH (ref 8–23)
CO2: 21 mmol/L — ABNORMAL LOW (ref 22–32)
Calcium: 7.7 mg/dL — ABNORMAL LOW (ref 8.9–10.3)
Chloride: 107 mmol/L (ref 98–111)
Creatinine, Ser: 2.25 mg/dL — ABNORMAL HIGH (ref 0.61–1.24)
GFR calc Af Amer: 28 mL/min — ABNORMAL LOW (ref >60)
GFR calc non Af Amer: 24 mL/min — ABNORMAL LOW (ref >60)
Glucose, Bld: 92 mg/dL (ref 70–99)
Potassium: 4.6 mmol/L (ref 3.5–5.1)
Sodium: 138 mmol/L (ref 135–145)
Total Bilirubin: 3.3 mg/dL — ABNORMAL HIGH (ref 0.3–1.2)
Total Protein: 5.9 g/dL — ABNORMAL LOW (ref 6.5–8.1)

## 2019-04-27 LAB — CBC WITH DIFFERENTIAL/PLATELET
Abs Immature Granulocytes: 0.06 10*3/uL (ref 0.00–0.07)
Basophils Absolute: 0 10*3/uL (ref 0.0–0.1)
Basophils Relative: 0 %
Eosinophils Absolute: 0.5 10*3/uL (ref 0.0–0.5)
Eosinophils Relative: 4 %
HCT: 30.4 % — ABNORMAL LOW (ref 39.0–52.0)
Hemoglobin: 9.6 g/dL — ABNORMAL LOW (ref 13.0–17.0)
Immature Granulocytes: 1 %
Lymphocytes Relative: 7 %
Lymphs Abs: 0.7 10*3/uL (ref 0.7–4.0)
MCH: 32.1 pg (ref 26.0–34.0)
MCHC: 31.6 g/dL (ref 30.0–36.0)
MCV: 101.7 fL — ABNORMAL HIGH (ref 80.0–100.0)
Monocytes Absolute: 0.6 10*3/uL (ref 0.1–1.0)
Monocytes Relative: 6 %
Neutro Abs: 8.6 10*3/uL — ABNORMAL HIGH (ref 1.7–7.7)
Neutrophils Relative %: 82 %
Platelets: 157 10*3/uL (ref 150–400)
RBC: 2.99 MIL/uL — ABNORMAL LOW (ref 4.22–5.81)
RDW: 16 % — ABNORMAL HIGH (ref 11.5–15.5)
WBC: 10.5 10*3/uL (ref 4.0–10.5)
nRBC: 0 % (ref 0.0–0.2)

## 2019-04-27 SURGERY — ERCP, WITH INTERVENTION IF INDICATED
Anesthesia: General

## 2019-04-27 MED ORDER — FENTANYL CITRATE (PF) 100 MCG/2ML IJ SOLN
INTRAMUSCULAR | Status: DC | PRN
  Administered 2019-04-27: 100 ug via INTRAVENOUS

## 2019-04-27 MED ORDER — PROPOFOL 10 MG/ML IV BOLUS
INTRAVENOUS | Status: DC | PRN
  Administered 2019-04-27: 50 mg via INTRAVENOUS

## 2019-04-27 MED ORDER — SUGAMMADEX SODIUM 200 MG/2ML IV SOLN
INTRAVENOUS | Status: DC | PRN
  Administered 2019-04-27: 280 mg via INTRAVENOUS

## 2019-04-27 MED ORDER — PROPOFOL 10 MG/ML IV BOLUS
INTRAVENOUS | Status: AC
  Filled 2019-04-27: qty 20

## 2019-04-27 MED ORDER — ONDANSETRON HCL 4 MG/2ML IJ SOLN
INTRAMUSCULAR | Status: DC | PRN
  Administered 2019-04-27: 4 mg via INTRAVENOUS

## 2019-04-27 MED ORDER — PHENYLEPHRINE HCL-NACL 10-0.9 MG/250ML-% IV SOLN
INTRAVENOUS | Status: DC | PRN
  Administered 2019-04-27: 10 ug/min via INTRAVENOUS
  Administered 2019-04-27: 25 ug/min via INTRAVENOUS

## 2019-04-27 MED ORDER — PHENYLEPHRINE 40 MCG/ML (10ML) SYRINGE FOR IV PUSH (FOR BLOOD PRESSURE SUPPORT)
PREFILLED_SYRINGE | INTRAVENOUS | Status: DC | PRN
  Administered 2019-04-27: 80 ug via INTRAVENOUS

## 2019-04-27 MED ORDER — ROCURONIUM BROMIDE 10 MG/ML (PF) SYRINGE
PREFILLED_SYRINGE | INTRAVENOUS | Status: DC | PRN
  Administered 2019-04-27: 70 mg via INTRAVENOUS

## 2019-04-27 MED ORDER — DEXAMETHASONE SODIUM PHOSPHATE 10 MG/ML IJ SOLN
INTRAMUSCULAR | Status: DC | PRN
  Administered 2019-04-27: 4 mg via INTRAVENOUS

## 2019-04-27 MED ORDER — INDOMETHACIN 50 MG RE SUPP
RECTAL | Status: AC
  Filled 2019-04-27: qty 2

## 2019-04-27 MED ORDER — FENTANYL CITRATE (PF) 100 MCG/2ML IJ SOLN
INTRAMUSCULAR | Status: AC
  Filled 2019-04-27: qty 2

## 2019-04-27 MED ORDER — LIDOCAINE 2% (20 MG/ML) 5 ML SYRINGE
INTRAMUSCULAR | Status: DC | PRN
  Administered 2019-04-27: 60 mg via INTRAVENOUS

## 2019-04-27 MED ORDER — GLUCAGON HCL RDNA (DIAGNOSTIC) 1 MG IJ SOLR
INTRAMUSCULAR | Status: AC
  Filled 2019-04-27: qty 1

## 2019-04-27 MED ORDER — PANTOPRAZOLE SODIUM 40 MG IV SOLR
40.0000 mg | Freq: Every day | INTRAVENOUS | Status: DC
  Administered 2019-04-27 – 2019-04-29 (×3): 40 mg via INTRAVENOUS
  Filled 2019-04-27 (×3): qty 40

## 2019-04-27 MED ORDER — SODIUM CHLORIDE 0.9 % IV SOLN
INTRAVENOUS | Status: DC
  Administered 2019-04-27: 500 mL via INTRAVENOUS

## 2019-04-27 NOTE — CV Procedure (Signed)
Echocardiogram not completed, [name] [name] is undergoing another procedure.  Darlina Sicilian RDCS

## 2019-04-27 NOTE — Progress Notes (Signed)
Hospitalist progress note   Patient from home, Patient going unclear, Dispo pending clinical improvement, testing per GI  CORTLAN DOLIN [id number] DOB: [birth year] DOA: 04/25/2019  PCP: Crist Infante, MD   Narrative:  41 white male prostate bladder cancer 2006+ ileal conduit/urethral stricture, sinus node dysfunction status post PPM dual-chamber [employer] initially 11/2007, AOS-severe surveillance gout, history of DVT Presented to [facility] ED fever left flank pain 1/31 slight S OB-work-up showed 1.4 nonobstructing left kidney stone moderate large right pleural effusion Cholelithiasis with choledocholithiasis, 1.9 cm mass right middle lobe Admitted for sepsis question secondary UTI, abnormal liver enzymes?  Cholelithiasis right lung mass ATC  Data Reviewed:  BUN/creatinine 43/2.03 AST/ALT 487/227-->231/196 Bilirubin 2.4-->3.4 BNP 2136 White count 15.2-->21.8 hemoglobin 11.5-->9.7 CT images reviewed  Assessment & Plan:  ?  Choledocholithiasis with possibility cholangitis For ERCP later today--dr. Kenard Gower of Anesthesia called me re: risk profile of procedure given severe AoS etc--I do not know of any other testing /procedure minimizing the risk of this procedure--I think it may be necessary despite the risks and greatly appreciate the  Concern of anesthesia--we might have to accept the risks given  Continuing Zosyn (covering anaerobes) and narrow after ERCP performed ID input appreciated and they have ordered a TTE 1.4 cm stone is nonobstructing-UA shows multiple species therefore less likely urinary Right pleural effusion  CXR 2/2 only effusion PNA AOS Sick sinus syndrome + PPM Slightly elevated troponin 49-62 Could be mediated by infection-he is not complaining of any chest pain at this time-would continue to trend and monitor and if something changes would get definitive consultant input Continue Lasix 20 started on metoprolol 12.5 XL aspirin 81 mg Likely dilutional anemia,  macrocytic May require outpatient work-up for anemia as it is stable at this time hold Hypothyroid Continue levothyroxine 25 mcg-outpatient follow-up Gout Continue Uloric 40 daily, colchicine 0.6 as needed Bladder/prostate CA with ileal conduit right-sided   Subjective:  HOH Daughter bedside No cp n fever no chills no dysuria was up OOb yesterday in halls no cough cold  Consultants:   Gastroenterology  Urology  Objective: Vitals:   04/26/19 1348 04/26/19 2000 04/27/19 0421 04/27/19 1123  BP: (!) 146/58 (!) 142/64 (!) 146/65   Pulse: (!) 58 (!) 59 60   Resp: (!) 22 14 14    Temp: 97.7 F (36.5 C) 97.7 F (36.5 C) 98.3 F (36.8 C)   TempSrc: Oral Oral Oral   SpO2: 100% 100% 96% 95%  Weight:      Height:        Intake/Output Summary (Last 24 hours) at 04/27/2019 1151 Last data filed at 04/27/2019 1042 Gross per 24 hour  Intake 704.25 ml  Output 650 ml  Net 54.25 ml   Filed Weights   04/25/19 1532 04/25/19 2233 04/26/19 0500  Weight: 68.9 kg 69.2 kg 70.2 kg    Examination:  coherent no distress EOMI NCAT no focal deficit S1-S2 no murmur rub or gallop Chest is clinically clear  Slightly tender on the right upper quadrant as well as in the left upper quadrant-Urostomy  ROM intact He does have a pacemaker in place-no erthema/swelling   Scheduled Meds: . aspirin EC  81 mg Oral Daily  . cholecalciferol  1,000 Units Oral Daily  . docusate sodium  300 mg Oral Daily  . enoxaparin (LOVENOX) injection  30 mg Subcutaneous QHS  . febuxostat  40 mg Oral Daily  . furosemide  20 mg Oral QODAY  . levothyroxine  25 mcg Oral Q0600  .  metoprolol succinate  12.5 mg Oral Daily  . multivitamin  1 tablet Oral Daily   Continuous Infusions: . sodium chloride 100 mL/hr at 04/27/19 1136  . piperacillin-tazobactam (ZOSYN)  IV 2.25 g (04/27/19 1144)     LOS: 2 days   Time spent: Elim, MD [facility]  04/27/2019, 11:51 AM

## 2019-04-27 NOTE — Consult Note (Signed)
[facility]    Date of Admission:  04/25/2019           Day 2 piperacillin tazobactam       Reason for Consult: Automatic consultation for Enterococcal bacteremia     Assessment: The source of his enterococcal bacteremia is not entirely clear with both urinary and biliary tract sources a possibility.  I agree with continuing piperacillin tazobactam for now to cover Enterococcus gram-negative rods and anaerobes.  He is scheduled for ERCP today.  I have ordered repeat blood cultures and TTE.  Plan: 1. Continue piperacillin tazobactam 2. Repeat blood cultures 3. TTE 4. Await results of ERCP  Principal Problem:   Enterococcal bacteremia Active Problems:   Acute lower UTI   Sepsis ([place])   Choledocholithiasis   Hypertension   Pacemaker   Aortic stenosis   CKD (chronic kidney disease), stage IV (HCC)   Bladder cancer (HCC)   Acute on chronic diastolic CHF (congestive heart failure) (HCC)   Abnormal liver function   Status post ileal conduit (HCC)   Macrocytic anemia   HOH (hard of hearing)   History of prostate cancer   Scheduled Meds: . aspirin EC  81 mg Oral Daily  . cholecalciferol  1,000 Units Oral Daily  . docusate sodium  300 mg Oral Daily  . enoxaparin (LOVENOX) injection  30 mg Subcutaneous QHS  . febuxostat  40 mg Oral Daily  . furosemide  20 mg Oral QODAY  . levothyroxine  25 mcg Oral Q0600  . metoprolol succinate  12.5 mg Oral Daily  . multivitamin  1 tablet Oral Daily   Continuous Infusions: . sodium chloride 100 mL/hr at 04/27/19 0803  . piperacillin-tazobactam (ZOSYN)  IV 2.25 g (04/27/19 0426)   PRN Meds:.colchicine, ondansetron (ZOFRAN) IV, polyethylene glycol, polyvinyl alcohol, traMADol  HPI: [name] [name] is a 86 y.o. male with a history of prostate and bladder cancer who had previously undergone cystectomy and ileal conduit formation.  He also had obstructive uropathy with right hydronephrosis leading to  right nephrostomy.  He was admitted on 04/25/2019 with low-grade fever of 100.5 degrees, weakness and acute on chronic left flank pain.  His white blood cell count was 21,800.  His liver enzymes were elevated.  He was started on broad empiric antibiotic therapy.  CT scan showed a moderate right pleural effusion and choledocholithiasis.  There was no evidence of acute cholecystitis or pyelonephritis.  Both admission blood cultures have grown Enterococcus.  Urine culture obtained from the ileal conduit grew multiple species. He also has a history of sinus bradycardia requiring a permanent pacemaker.  He also has calcific aortic stenosis.   Review of Systems: Review of Systems  Constitutional: Positive for malaise/fatigue. Negative for chills, diaphoresis, fever and weight loss.  Respiratory: Negative for cough and shortness of breath.   Cardiovascular: Negative for chest pain.  Gastrointestinal: Positive for abdominal pain. Negative for diarrhea, nausea and vomiting.  Genitourinary: Positive for flank pain.  Skin: Negative for rash.    Past Medical History:  Diagnosis Date  . Aortic valve stenosis    a. mild to moderate by echo 2013  . Bradycardia    MDT dual chamber pacemaker  . Cancer [facility])    bladder cancer  . Chronic kidney disease   . Deep venous thrombosis (HCC)    hx of  LEFT ARM AFTER PACEMAKER INSERTION ABOUT 6 YRS AGO  . Depression   . Gout   .  Hypertension   . Kidney stone   . Macular degeneration    PT STATES HIS VISION STILL OKAY FOR DRIVING  . Osteoporosis   . Prostate cancer ([place])    AND BLADDER CANCER - S/P URETEROILEAL CONDUIT  . Renal disorder   . S/P ileal conduit (HCC)         Social History   Tobacco Use  . Smoking status: Former Smoker    Types: Cigarettes    Quit date: 03/25/1952    Years since quitting: 67.1  . Smokeless tobacco: Never Used  Substance Use Topics  . Alcohol use: No  . Drug use: No    Family History  Problem Relation Age of Onset   . Pancreatic cancer Father 45       died  . Pancreatic cancer Mother 87       died   No Known Allergies  OBJECTIVE: Blood pressure (!) 146/65, pulse 60, temperature 98.3 F (36.8 C), temperature source Oral, resp. rate 14, height 5\' 7"  (1.702 m), weight 70.2 kg, SpO2 96 %.  Physical Exam Constitutional:      Comments: He is resting quietly in bed.  He is very pleasant and in no distress.  HENT:     Ears:     Comments: He is hard of hearing. Cardiovascular:     Rate and Rhythm: Normal rate and regular rhythm.     Heart sounds: Murmur present.     Comments: He has a 2/6 systolic murmur. Pulmonary:     Effort: Pulmonary effort is normal.     Breath sounds: Normal breath sounds.  Abdominal:     Palpations: Abdomen is soft. There is no mass.     Tenderness: There is no abdominal tenderness.     Comments: He has a right-sided ileal conduit.  He has a right nephrostomy tube.  Skin:    Findings: No rash.  Neurological:     General: No focal deficit present.  Psychiatric:        Mood and Affect: Mood normal.     Lab Results Lab Results  Component Value Date   WBC 10.5 04/27/2019   HGB 9.6 (L) 04/27/2019   HCT 30.4 (L) 04/27/2019   MCV 101.7 (H) 04/27/2019   PLT 157 04/27/2019    Lab Results  Component Value Date   CREATININE 2.25 (H) 04/27/2019   BUN 43 (H) 04/27/2019   NA 138 04/27/2019   K 4.6 04/27/2019   CL 107 04/27/2019   CO2 21 (L) 04/27/2019    Lab Results  Component Value Date   ALT 196 (H) 04/27/2019   AST 231 (H) 04/27/2019   ALKPHOS 137 (H) 04/27/2019   BILITOT 3.3 (H) 04/27/2019     Microbiology: Recent Results (from the past 240 hour(s))  Urine Culture     Status: Abnormal   Collection Time: 04/25/19  5:47 PM   Specimen: Urine, Random  Result Value Ref Range Status   Specimen Description   Final    URINE, RANDOM Performed at [facility] [address], [place], [place] [address]    Special Requests   Final    RIGHT  NEPHROSTMY Performed at [facility] [address] [place], [place] [address]    Culture MULTIPLE SPECIES PRESENT, SUGGEST RECOLLECTION (A)  Final   Report Status 04/26/2019 FINAL  Final  Urine culture     Status: Abnormal   Collection Time: 04/25/19  5:49 PM   Specimen: Urine, Random  Result Value Ref Range Status   Specimen Description   Final    URINE, RANDOM Performed at [facility] [address] [place], [place] [address]    Special Requests   Final    NONE Performed at [facility], [place] [address], [place], [place] [address]    Culture MULTIPLE SPECIES PRESENT, SUGGEST RECOLLECTION (A)  Final   Report Status 04/26/2019 FINAL  Final  Respiratory Panel by RT PCR (Flu A&B, Covid) - Nasopharyngeal Swab     Status: None   Collection Time: 04/25/19  6:53 PM   Specimen: Nasopharyngeal Swab  Result Value Ref Range Status   SARS Coronavirus 2 by RT PCR NEGATIVE NEGATIVE Final    Comment: (NOTE) SARS-CoV-2 target nucleic acids are NOT DETECTED. The SARS-CoV-2 RNA is generally detectable in upper respiratoy specimens during the acute phase of infection. The lowest concentration of SARS-CoV-2 viral copies this assay can detect is 131 copies/mL. A negative result does not preclude SARS-Cov-2 infection and should not be used as the sole basis for treatment or other patient management decisions. A negative result may occur with  improper specimen collection/handling, submission of specimen other than nasopharyngeal swab, presence of viral mutation(s) within the areas targeted by this assay, and inadequate number of viral copies (<131 copies/mL). A negative result must be combined with clinical observations, patient history, and epidemiological information. The expected result is Negative. Fact Sheet for Patients:  PinkCheek.be Fact Sheet for Healthcare Providers:   GravelBags.it This test is not yet ap proved or cleared by the [place] FDA and  has been authorized for detection and/or diagnosis of SARS-CoV-2 by FDA under an Emergency Use Authorization (EUA). This EUA will remain  in effect (meaning this test can be used) for the duration of the COVID-19 declaration under Section 564(b)(1) of the Act, 21 U.S.C. section 360bbb-3(b)(1), unless the authorization is terminated or revoked sooner.    Influenza A by PCR NEGATIVE NEGATIVE Final   Influenza B by PCR NEGATIVE NEGATIVE Final    Comment: (NOTE) The Xpert Xpress SARS-CoV-2/FLU/RSV assay is intended as an aid in  the diagnosis of influenza from Nasopharyngeal swab specimens and  should not be used as a sole basis for treatment. Nasal washings and  aspirates are unacceptable for Xpert Xpress SARS-CoV-2/FLU/RSV  testing. Fact Sheet for Patients: PinkCheek.be Fact Sheet for Healthcare Providers: GravelBags.it This test is not yet approved or cleared by the [place] FDA and  has been authorized for detection and/or diagnosis of SARS-CoV-2 by  FDA under an Emergency Use Authorization (EUA). This EUA will remain  in effect (meaning this test can be used) for the duration of the  Covid-19 declaration under Section 564(b)(1) of the Act, 21  U.S.C. section 360bbb-3(b)(1), unless the authorization is  terminated or revoked. Performed at [facility], [place] [address], [place], [place] [address]   Blood culture (routine x 2)     Status: Abnormal (Preliminary result)   Collection Time: 04/25/19  8:29 PM   Specimen: BLOOD  Result Value Ref Range Status   Specimen Description BLOOD LEFT ANTECUBITAL  Final   Special Requests   Final    BOTTLES DRAWN AEROBIC AND ANAEROBIC Blood Culture adequate volume Performed at [facility] [address] [place], [place] [address]     Culture  Setup Time   Final    GRAM POSITIVE COCCI IN BOTH AEROBIC AND ANAEROBIC BOTTLES CRITICAL RESULT CALLED TO, READ BACK BY AND  VERIFIED WITH: [place] @1455  04/26/19 AKT Performed at [facility], [place] [address], [place], [place] [address]    Culture ENTEROCOCCUS CASSELIFLAVUS (A)  Final   Report Status PENDING  Incomplete  Blood culture (routine x 2)     Status: Abnormal (Preliminary result)   Collection Time: 04/25/19  8:33 PM   Specimen: BLOOD RIGHT FOREARM  Result Value Ref Range Status   Specimen Description BLOOD RIGHT FOREARM  Final   Special Requests   Final    BOTTLES DRAWN AEROBIC AND ANAEROBIC Blood Culture adequate volume Performed at [facility] [address] [place], [place] [address]    Culture  Setup Time   Final    GRAM POSITIVE COCCI IN BOTH AEROBIC AND ANAEROBIC BOTTLES CRITICAL VALUE NOTED.  VALUE IS CONSISTENT WITH PREVIOUSLY REPORTED AND CALLED VALUE. AKT Organism ID to follow CRITICAL RESULT CALLED TO, READ BACK BY AND VERIFIED WITH: Christean Grief [facility] 04/26/19 1750 JDW Performed at [facility], [place] [address], [place], [place] [address]    Culture (A)  Final    ENTEROCOCCUS CASSELIFLAVUS SUSCEPTIBILITIES TO FOLLOW    Report Status PENDING  Incomplete  Blood Culture ID Panel (Reflexed)     Status: Abnormal   Collection Time: 04/25/19  8:33 PM  Result Value Ref Range Status   Enterococcus species DETECTED (A) NOT DETECTED Final    Comment: CRITICAL RESULT CALLED TO, READ BACK BY AND VERIFIED WITH: J LEGGE PHARMD 04/26/19 1750 JDW    Vancomycin resistance NOT DETECTED NOT DETECTED Final   Listeria monocytogenes NOT DETECTED NOT DETECTED Final   Staphylococcus species NOT DETECTED NOT DETECTED Final   Staphylococcus aureus (BCID) NOT DETECTED NOT DETECTED Final   Streptococcus species NOT DETECTED NOT DETECTED Final   Streptococcus agalactiae NOT DETECTED NOT DETECTED Final   Streptococcus pneumoniae NOT DETECTED  NOT DETECTED Final   Streptococcus pyogenes NOT DETECTED NOT DETECTED Final   Acinetobacter baumannii NOT DETECTED NOT DETECTED Final   Enterobacteriaceae species NOT DETECTED NOT DETECTED Final   Enterobacter cloacae complex NOT DETECTED NOT DETECTED Final   Escherichia coli NOT DETECTED NOT DETECTED Final   Klebsiella oxytoca NOT DETECTED NOT DETECTED Final   Klebsiella pneumoniae NOT DETECTED NOT DETECTED Final   Proteus species NOT DETECTED NOT DETECTED Final   Serratia marcescens NOT DETECTED NOT DETECTED Final   Haemophilus influenzae NOT DETECTED NOT DETECTED Final   Neisseria meningitidis NOT DETECTED NOT DETECTED Final   Pseudomonas aeruginosa NOT DETECTED NOT DETECTED Final   Candida albicans NOT DETECTED NOT DETECTED Final   Candida glabrata NOT DETECTED NOT DETECTED Final   Candida krusei NOT DETECTED NOT DETECTED Final   Candida parapsilosis NOT DETECTED NOT DETECTED Final   Candida tropicalis NOT DETECTED NOT DETECTED Final    Comment: Performed at [facility], [address] [place], [place] [address]    Michel Bickers, [facility] [phone] pager   [phone] cell 04/27/2019, 9:51 AM

## 2019-04-27 NOTE — Anesthesia Preprocedure Evaluation (Addendum)
Anesthesia Evaluation  Patient identified by MRN, date of birth, ID band Patient awake    Reviewed: Allergy & Precautions, NPO status , Patient's Chart, lab work & pertinent test results  History of Anesthesia Complications Negative for: history of anesthetic complications  Airway Mallampati: II  TM Distance: <3 FB Neck ROM: Full    Dental  (+) Teeth Intact   Pulmonary neg pulmonary ROS, former smoker,    Pulmonary exam normal        Cardiovascular hypertension, +CHF  Normal cardiovascular exam+ pacemaker + Valvular Problems/Murmurs AS      Neuro/Psych negative neurological ROS  negative psych ROS   GI/Hepatic Neg liver ROS, choledocholithiasis   Endo/Other  negative endocrine ROS  Renal/GU Renal InsufficiencyRenal disease   Bladder cancer s/p ileal conduit    Musculoskeletal negative musculoskeletal ROS (+)   Abdominal   Peds  Hematology  (+) anemia ,   Anesthesia Other Findings Severe AS, EF 20-25%  Reproductive/Obstetrics                           Anesthesia Physical Anesthesia Plan  ASA: IV  Anesthesia Plan: General   Post-op Pain Management:    Induction: Intravenous  PONV Risk Score and Plan: 2 and Ondansetron, Dexamethasone and Treatment may vary due to age or medical condition  Airway Management Planned: Oral ETT  Additional Equipment: Arterial line  Intra-op Plan:   Post-operative Plan: Extubation in OR  Informed Consent: I have reviewed the patients History and Physical, chart, labs and discussed the procedure including the risks, benefits and alternatives for the proposed anesthesia with the patient or authorized representative who has indicated his/her understanding and acceptance.   Patient has DNR.  Discussed DNR with patient, Discussed DNR with power of attorney and Suspend DNR.     Plan Discussed with:   Anesthesia Plan Comments: (Discussed in detail  with patient and daughter (who is POA) the high risk nature of general anesthesia given history of severe aortic stenosis and systolic heart failure. Discussed additional precautions such as arterial line for close hemodynamic monitoring, but that risk of serious complication from anesthesia is still quite high. They understand agreed to proceed, and will suspend DNR for duration of procedure. )       Anesthesia Quick Evaluation

## 2019-04-27 NOTE — Anesthesia Procedure Notes (Signed)
Procedure Name: Intubation Date/Time: 04/27/2019 2:17 PM Performed by: Niel Hummer, CRNA Pre-anesthesia Checklist: Patient identified, Emergency Drugs available, Suction available and Patient being monitored Patient Re-evaluated:Patient Re-evaluated prior to induction Oxygen Delivery Method: Circle system utilized Preoxygenation: Pre-oxygenation with 100% oxygen Induction Type: IV induction and Inhalational induction Ventilation: Mask ventilation without difficulty Laryngoscope Size: Mac and 4 Grade View: Grade II Tube type: Oral Tube size: 7.5 mm Number of attempts: 1 Airway Equipment and Method: Stylet Placement Confirmation: ETT inserted through vocal cords under direct vision,  positive ETCO2 and breath sounds checked- equal and bilateral Secured at: 23 cm Tube secured with: Tape Dental Injury: Teeth and Oropharynx as per pre-operative assessment

## 2019-04-27 NOTE — Transfer of Care (Signed)
Immediate Anesthesia Transfer of Care Note  Patient: [name] [name]  Procedure(s) Performed: ENDOSCOPIC RETROGRADE CHOLANGIOPANCREATOGRAPHY (ERCP) (N/A ) SPHINCTEROTOMY ESOPHAGOGASTRODUODENOSCOPY (EGD) (N/A ) BALLOON DILATION (N/A ) REMOVAL OF STONES  Patient Location: PACU  Anesthesia Type:General  Level of Consciousness: awake, alert  and oriented  Airway & Oxygen Therapy: Patient Spontanous Breathing and Patient connected to face mask oxygen  Post-op Assessment: Report given to RN, Post -op Vital signs reviewed and stable and Patient moving all extremities X 4  Post vital signs: Reviewed and stable  Last Vitals:  Vitals Value Taken Time  BP 177/71 04/27/19 1539  Temp    Pulse 61 04/27/19 1542  Resp 28 04/27/19 1542  SpO2 100 % 04/27/19 1542  Vitals shown include unvalidated device data.  Last Pain:  Vitals:   04/27/19 1337  TempSrc:   PainSc: 2       Patients Stated Pain Goal: 0 (37/90/24 0973)  Complications: No apparent anesthesia complications

## 2019-04-27 NOTE — Op Note (Signed)
[facility] Patient Name: [name] [name] Procedure Date: 04/27/2019 MRN: [id number] Attending MD: Clarene Essex , MD Date of Birth: Jan 18, 1922 CSN: [id number] Age: 86 Admit Type: Inpatient Procedure:                ERCP Indications:              Bile duct stone(s), Suspected ascending                            cholangitis, For therapy of ascending cholangitis,                            Jaundice, Elevated liver enzymes Providers:                Clarene Essex, MD, Jeanella Cara, RN, Cherylynn Ridges, Technician, Corie Chiquito, Technician,                            Maudry Diego, CRNA Referring MD:              Medicines:                General Anesthesia Complications:            No immediate complications. Estimated Blood Loss:     Estimated blood loss: none. Procedure:                Pre-Anesthesia Assessment:                           - Prior to the procedure, a History and Physical                            was performed, and patient medications and                            allergies were reviewed. The patient's tolerance of                            previous anesthesia was also reviewed. The risks                            and benefits of the procedure and the sedation                            options and risks were discussed with the patient.                            All questions were answered, and informed consent                            was obtained. Prior Anticoagulants: The patient has                            taken no previous anticoagulant or antiplatelet  agents. ASA Grade Assessment: III - A patient with                            severe systemic disease. After reviewing the risks                            and benefits, the patient was deemed in                            satisfactory condition to undergo the procedure.                           After obtaining informed consent, the  scope was                            passed under direct vision. Throughout the                            procedure, the patient's blood pressure, pulse, and                            oxygen saturations were monitored continuously. The                            [id number]-Q190V ([id number]) Olympus duodenoscope was                            introduced through the mouth, and used to inject                            contrast into and used to cannulate the bile duct.                            The ERCP was somewhat difficult due to abnormal                            anatomy. Successful completion of the procedure was                            aided by withdrawing the scope and replacing with                            the adult endoscope. The patient tolerated the                            procedure well. Scope In: Scope Out: Findings:      Initially we were unable to pass the ERCP scope due to pylorus stenosis       so we substituted a A standard esophagogastroduodenoscopy scope was used       for the examination of the upper gastrointestinal tract. There was a       moderate amount of food left in the stomach and some of which was       suctioned with both scopes and  the scope was passed under direct vision       through the upper GI tract. A deformity was found at the pylorus.       Dilation of pylorus with a 12-13.5-15 mm balloon (to a maximum balloon       size of 13.5 mm) dilator was successful. A standard       esophagogastroduodenoscopy scope was used for the examination of the       upper gastrointestinal tract. The scope was passed under direct vision       through the upper GI tract. Few non-bleeding superficial duodenal ulcers       were found in the duodenal bulb and in the first portion of the       duodenum. We then withdrew the straight scope and retried the       side-viewing scope and with rolling him on his left side we were able to       advance into the duodenum and a  normal-appearing ampulla was brought       into view and after a few manipulations without any pancreatic wire       advancements or dye injections deep selective cannulation of the CBD was       seen in at least 2 obvious stones were seen on initial injection and we       proceeded with a biliary sphincterotomy which was made with a Hydratome       sphincterotome using ERBE electrocautery. There was no       post-sphincterotomy bleeding. We proceeded with the sphincterotomy until       we had adequate biliary drainage and we could get the fully bowed       sphincterotome easily in and out of the duct and choledocholithiasis was       found in a mildly dilated duct. To discover objects, the biliary tree       was swept with a 12- 15 mm balloon starting at the bifurcation. Sludge       was swept from the duct. At least 5 stones were removed. No stones       remained at the end of the procedure after multiple balloon       pull-through's. We did have to lower the 15 mm balloon at the distal       duct to 12 mm in order to pull it through the distal part of the duct       and the 12 mm balloon passed readily through the sphincterotomy site       multiple times and multiple occlusion cholangiograms were done but       unfortunately there was a little too much air in the system but we do       not believe any stones were remaining in the CBD however there were       multiple gallstones in the gallbladder and there was adequate biliary       drainage at the end of the procedure and the scope wire and balloon were       removed and the patient tolerated the procedure well there was no       obvious immediate complication Impression:               - Deformity in the pylorus.                           -  Non-bleeding duodenal ulcers.                           - Choledocholithiasis was found. Complete removal                            was accomplished by biliary sphincterotomy and                             balloon extraction.                           - Pylorus were successfully dilated.                           - A biliary sphincterotomy was performed.                           - The biliary tree was swept multiple times as                            above. Moderate Sedation:      Not Applicable - Patient had care per Anesthesia. Recommendation:           - Clear liquid diet today.                           - Continue present medications.                           - Return to GI clinic PRN.                           - Telephone GI clinic if symptomatic PRN.                           - Check liver enzymes (AST, ALT, alkaline                            phosphatase, bilirubin) tomorrow. Consider                            cholecystectomy to prevent further stone issues Procedure Code(s):        --- Professional ---                           [phone], Endoscopic retrograde                            cholangiopancreatography (ERCP); with removal of                            calculi/debris from biliary/pancreatic duct(s)                           43262, Endoscopic retrograde  cholangiopancreatography (ERCP); with                            sphincterotomy/papillotomy Diagnosis Code(s):        --- Professional ---                           K31.89, Other diseases of stomach and duodenum                           K26.9, Duodenal ulcer, unspecified as acute or                            chronic, without hemorrhage or perforation                           K80.30, Calculus of bile duct with cholangitis,                            unspecified, without obstruction                           K83.09, Other cholangitis                           R17, Unspecified jaundice                           R74.8, Abnormal levels of other serum enzymes CPT copyright 2019 [employer]. All rights reserved. The codes documented in this report are preliminary and upon  coder review may  be revised to meet current compliance requirements. Clarene Essex, MD 04/27/2019 3:41:46 PM This report has been signed electronically. Number of Addenda: 0

## 2019-04-27 NOTE — Consult Note (Signed)
   [facility] Inpatient Consult   04/27/2019  IVOR KISHI January 28, 1922 [id number]    Patient'schart reviewed for 24% high risk score for unplanned readmission and hospitalization;  and to check for [facility] services needed under his [facility] plan.   Chart review reveals PT note showing plan is for patient to return home (with home health) and are planning for palliative care consult, per daughter.   Per MD notes, patient is scheduled for ERCP (endoscopic retrograde cholangiopancreatography) today for possible choledocholithiasis with cholangitis.  Plan:  Will continue to follow for progression and disposition recommendations.     Of note, [facility] Care Management services does not replace or interfere with any services that are arranged by transition of care case management or social work.   For questions and referral, please call:  Edwena Felty A. Luther Newhouse, BSN, RN-BC [facility] Liaison Cell: [phone]

## 2019-04-27 NOTE — Anesthesia Procedure Notes (Signed)
Arterial Line Insertion Performed by: Lidia Collum, MD, anesthesiologist  Preanesthetic checklist: patient identified, risks and benefits discussed, surgical consent and pre-op evaluation Lidocaine 1% used for infiltration Left, radial was placed Catheter size: 20 G Hand hygiene performed  and Seldinger technique used  Attempts: 1 Procedure performed using ultrasound guided technique. Ultrasound Notes:anatomy identified, needle tip was noted to be adjacent to the nerve/plexus identified and no ultrasound evidence of intravascular and/or intraneural injection Following insertion, dressing applied and Biopatch. Post procedure assessment: normal  Patient tolerated the procedure well with no immediate complications.

## 2019-04-27 NOTE — Anesthesia Postprocedure Evaluation (Signed)
Anesthesia Post Note  Patient: JADIS MIKA  Procedure(s) Performed: ENDOSCOPIC RETROGRADE CHOLANGIOPANCREATOGRAPHY (ERCP) (N/A ) SPHINCTEROTOMY ESOPHAGOGASTRODUODENOSCOPY (EGD) (N/A ) BALLOON DILATION (N/A ) REMOVAL OF STONES     Patient location during evaluation: PACU Anesthesia Type: General Level of consciousness: awake and alert Pain management: pain level controlled Vital Signs Assessment: post-procedure vital signs reviewed and stable Respiratory status: spontaneous breathing, nonlabored ventilation, respiratory function stable and patient connected to nasal cannula oxygen Cardiovascular status: blood pressure returned to baseline and stable Postop Assessment: no apparent nausea or vomiting Anesthetic complications: no    Last Vitals:  Vitals:   04/27/19 1539 04/27/19 1545  BP: (!) 177/71 (!) 164/67  Pulse: 60 (!) 56  Resp: 20 (!) 23  Temp: 36.9 C   SpO2: 99% 100%    Last Pain:  Vitals:   04/27/19 1600  TempSrc:   PainSc: (P) 0-No pain                 Barnet Glasgow

## 2019-04-27 NOTE — Progress Notes (Signed)
[name] [name] 1:24 PM  Subjective: Patient seen and examined in his hospital computer chart reviewed and the case discussed with the anesthesiology team as well as my partner Dr. Michail Sermon as well as the patient's daughter and currently his pain that brought him to the hospital is better and might be more in the middle and he has bacteremia and the urologist does not feel that it is coming from his kidneys and he has CBD stones on CT scan as well as increased liver test and his daughter notes he has not been jaundiced very long and he has no other complaints  Objective: Vital signs stable afebrile no acute distress exam please see preassessment evaluation CT and labs reviewed  Assessment: CBD stones and probable cholangitis with bile duct partial obstruction  Plan: Okay to proceed with ERCP with anesthesia assistance and the risk benefits and methods and success rate was discussed with the patient and his daughter as well  [facility]  office [phone] After 5PM or if no answer call [phone]

## 2019-04-28 ENCOUNTER — Inpatient Hospital Stay (HOSPITAL_COMMUNITY): Payer: Medicare HMO

## 2019-04-28 ENCOUNTER — Encounter: Payer: Self-pay | Admitting: *Deleted

## 2019-04-28 DIAGNOSIS — K8309 Other cholangitis: Secondary | ICD-10-CM

## 2019-04-28 DIAGNOSIS — Z515 Encounter for palliative care: Secondary | ICD-10-CM

## 2019-04-28 DIAGNOSIS — Z7189 Other specified counseling: Secondary | ICD-10-CM

## 2019-04-28 DIAGNOSIS — R7881 Bacteremia: Secondary | ICD-10-CM

## 2019-04-28 LAB — COMPREHENSIVE METABOLIC PANEL
ALT: 162 U/L — ABNORMAL HIGH (ref 0–44)
AST: 142 U/L — ABNORMAL HIGH (ref 15–41)
Albumin: 2.5 g/dL — ABNORMAL LOW (ref 3.5–5.0)
Alkaline Phosphatase: 152 U/L — ABNORMAL HIGH (ref 38–126)
Anion gap: 8 (ref 5–15)
BUN: 43 mg/dL — ABNORMAL HIGH (ref 8–23)
CO2: 20 mmol/L — ABNORMAL LOW (ref 22–32)
Calcium: 7.5 mg/dL — ABNORMAL LOW (ref 8.9–10.3)
Chloride: 109 mmol/L (ref 98–111)
Creatinine, Ser: 2.42 mg/dL — ABNORMAL HIGH (ref 0.61–1.24)
GFR calc Af Amer: 25 mL/min — ABNORMAL LOW (ref >60)
GFR calc non Af Amer: 22 mL/min — ABNORMAL LOW (ref >60)
Glucose, Bld: 168 mg/dL — ABNORMAL HIGH (ref 70–99)
Potassium: 5.5 mmol/L — ABNORMAL HIGH (ref 3.5–5.1)
Sodium: 137 mmol/L (ref 135–145)
Total Bilirubin: 3 mg/dL — ABNORMAL HIGH (ref 0.3–1.2)
Total Protein: 6.3 g/dL — ABNORMAL LOW (ref 6.5–8.1)

## 2019-04-28 LAB — CULTURE, BLOOD (ROUTINE X 2)
Special Requests: ADEQUATE
Special Requests: ADEQUATE

## 2019-04-28 LAB — CBC
HCT: 32.1 % — ABNORMAL LOW (ref 39.0–52.0)
Hemoglobin: 9.8 g/dL — ABNORMAL LOW (ref 13.0–17.0)
MCH: 32.1 pg (ref 26.0–34.0)
MCHC: 30.5 g/dL (ref 30.0–36.0)
MCV: 105.2 fL — ABNORMAL HIGH (ref 80.0–100.0)
Platelets: 178 10*3/uL (ref 150–400)
RBC: 3.05 MIL/uL — ABNORMAL LOW (ref 4.22–5.81)
RDW: 15.9 % — ABNORMAL HIGH (ref 11.5–15.5)
WBC: 7.1 10*3/uL (ref 4.0–10.5)
nRBC: 0 % (ref 0.0–0.2)

## 2019-04-28 LAB — ECHOCARDIOGRAM COMPLETE
Height: 67 in
Weight: 2550.28 oz

## 2019-04-28 MED ORDER — FUROSEMIDE 20 MG PO TABS: 20.0000 mg | ORAL_TABLET | Freq: Every day | ORAL | Status: DC

## 2019-04-28 NOTE — Progress Notes (Signed)
PROGRESS NOTE    [name] [name]    Code Status: DNR  ELF:[id number] DOB: 1921/04/23 DOA: 04/25/2019  PCP: Crist Infante, MD    Hospital Summary  This is a 86 year old male with history of prostate and bladder cancer in 2006 with ileal conduit/urethral stricture, sinus node dysfunction status post PPM dual-chamber, aortic stenosis, gout, DVT who presented to [facility] with fever and left flank pain on 1/31 and shortness of breath, admitted for sepsis and found to have dural coccal bacteremia as well as found to have nonobstructing left renal stone and found to have elevated LFTs, Noncon CT showed CBD stones and intrahepatic and extrahepatic biliary dilation.  GI was consulted and he underwent ERCP with sphincterotomy with Dr. Watt Climes on 2/2.  For bacteremia, ID was consulted and recommended TTE and continue Zosyn.  At family's request, palliative care was consulted for [place] discussion who recommended home-based palliative care at discharge  A & P   Principal Problem:   Enterococcal bacteremia Active Problems:   Hypertension   Pacemaker   Aortic stenosis   CKD (chronic kidney disease), stage IV (HCC)   Acute lower UTI   Sepsis ([place])   Bladder cancer ([place])   Acute on chronic diastolic CHF (congestive heart failure) (HCC)   Abnormal liver function   Choledocholithiasis   Status post ileal conduit (HCC)   Macrocytic anemia   HOH (hard of hearing)   History of prostate cancer   1. Choledocholithiasis status post ERCP and sphincterotomy 04/27/2019 with Dr. Watt Climes a. Initial concern for possible cholangitis b. LFTs improving c. GI on board, advance diet 2. Sepsis on admission a. Suspect multifactorial: UTI unknown organism, Enterococcus bacteremia, choledocholithiasis b. Sepsis resolved c. Continue Zosyn, appreciate ID further recommendations 3. Enterococcus bacteremia, unknown source a. Day 3/TBD Zosyn b. No vegetations noted on TTE c. Appreciate ID recommendations 4. Right pleural  effusion a. Requiring 2 L/min nasal cannula b. Increase frequency of Lasix to daily 5. Severe aortic stenosis a. worsened from previous echo on 01/28/2019 b. Consider cardio eval in a.m. versus close outpatient follow-up 6. HFrEF a. EF similar to prior, 20 to 25% b. Weight up 68.9 to 72.3 kg today c. Increase Lasix frequency to daily d. Continue beta-blocker e. Daily weights f. Continue telemetry 7. Sick sinus syndrome status post PPM 8. Elevated troponin, suspect demand ischemia a. Continue telemetry 9. CKD 4 a. Creatinine 2.4, appears at/near baseline creatinine 2-2.3 b. Increase Lasix as above 10. Hyperkalemia a. K5.5, likely from missing Lasix dose as was on every other day dosing b. Lasix as above 11. Hypothyroid stable on levothyroxine 12. Gout a. Continue current regimen 13. Bladder/prostate cancer history with ileal conduit  DVT prophylaxis: Lovenox Diet: Full liquid Family Communication: Patient's daughter has been updated at bedside Disposition Plan: Remains inpatient with plan for home health with PT at discharge, barrier to discharge is advancement of diet and improved lab work, anticipate discharge in next 24 to 48 hours  Consultants  GI ID Palliative care  Procedures  ERCP with sphincterotomy 04/27/2019  Antibiotics   Anti-infectives (From admission, onward)   Start     Dose/Rate Route Frequency Ordered Stop   04/27/19 2100  vancomycin (VANCOCIN) IVPB 1000 mg/200 mL premix  Status:  Discontinued     1,000 mg 200 mL/hr over 60 Minutes Intravenous Every 48 hours 04/25/19 2129 04/26/19 1808   04/26/19 0200  piperacillin-tazobactam (ZOSYN) IVPB 2.25 g     2.25 g 100 mL/hr over 30 Minutes Intravenous Every  6 hours 04/25/19 2129     04/25/19 2045  vancomycin (VANCOCIN) IVPB 1000 mg/200 mL premix     1,000 mg 200 mL/hr over 60 Minutes Intravenous  Once 04/25/19 2040 04/26/19 0028   04/25/19 1915  piperacillin-tazobactam (ZOSYN) IVPB 3.375 g     3.375 g 100  mL/hr over 30 Minutes Intravenous  Once 04/25/19 1911 04/25/19 2102           Subjective   Seen and examined at bedside with daughter at bedside patient resting comfortably sitting upright.  States that his symptoms have improved but still has some abdominal discomfort post procedure.  Otherwise denies any complaints at this time.  Objective   Vitals:   04/27/19 2023 04/28/19 0000 04/28/19 0344 04/28/19 0500  BP: (!) 169/71 (!) 159/75 (!) 155/75   Pulse:  65 62   Resp:  18 18   Temp:  97.7 F (36.5 C) 97.9 F (36.6 C)   TempSrc:  Oral    SpO2:  99% 100%   Weight:    72.3 kg  Height:        Intake/Output Summary (Last 24 hours) at 04/28/2019 1647 Last data filed at 04/28/2019 0600 Gross per 24 hour  Intake 1581.24 ml  Output 650 ml  Net 931.24 ml   Filed Weights   04/26/19 0500 04/27/19 1337 04/28/19 0500  Weight: 70.2 kg 70.2 kg 72.3 kg    Examination:  Physical Exam Vitals and nursing note reviewed. Exam conducted with a chaperone present.  Constitutional:      Appearance: Normal appearance.  HENT:     Head: Normocephalic.  Eyes:     Conjunctiva/sclera: Conjunctivae normal.  Cardiovascular:     Rate and Rhythm: Normal rate.     Heart sounds: Murmur present. Systolic murmur present with a grade of 4/6.  Pulmonary:     Effort: Pulmonary effort is normal. No respiratory distress.     Breath sounds: No wheezing.     Comments: On nasal cannula Abdominal:     General: There is no distension.     Comments: Mild epigastric tenderness to palpation  Musculoskeletal:     Right lower leg: No edema.     Left lower leg: No edema.     Comments: Bilateral upper extremity edema  Skin:    Coloration: Skin is not jaundiced.  Neurological:     Mental Status: He is alert. Mental status is at baseline.  Psychiatric:        Mood and Affect: Mood normal.        Behavior: Behavior normal.     Data Reviewed: I have personally reviewed following labs and imaging  studies  CBC: Recent Labs  Lab 04/25/19 1749 04/26/19 0425 04/27/19 0419 04/28/19 0449  WBC 15.2* 21.8* 10.5 7.1  NEUTROABS 13.9*  --  8.6*  --   HGB 11.5* 9.7* 9.6* 9.8*  HCT 36.0* 30.7* 30.4* 32.1*  MCV 102.9* 102.3* 101.7* 105.2*  PLT 231 183 157 010   Basic Metabolic Panel: Recent Labs  Lab 04/25/19 1749 04/26/19 0425 04/27/19 0419 04/28/19 0449  NA 139 138 138 137  K 4.9 4.9 4.6 5.5*  CL 105 106 107 109  CO2 26 22 21* 20*  GLUCOSE 98 108* 92 168*  BUN 43* 44* 43* 43*  CREATININE 2.03* 2.04* 2.25* 2.42*  CALCIUM 8.5* 7.4* 7.7* 7.5*   GFR: Estimated Creatinine Clearance: 17.1 mL/min (A) (by C-G formula based on SCr of 2.42 mg/dL (H)). Liver Function Tests: Recent  Labs  Lab 04/25/19 1749 04/26/19 0425 04/27/19 0419 04/28/19 0449  AST 487* 501* 231* 142*  ALT 227* 292* 196* 162*  ALKPHOS 184* 157* 137* 152*  BILITOT 2.4* 3.4* 3.3* 3.0*  PROT 7.5 6.0* 5.9* 6.3*  ALBUMIN 3.5 2.7* 2.6* 2.5*   Recent Labs  Lab 04/25/19 1749  LIPASE 33   No results for input(s): AMMONIA in the last 168 hours. Coagulation Profile: No results for input(s): INR, PROTIME in the last 168 hours. Cardiac Enzymes: Recent Labs  Lab 04/25/19 2225  CKTOTAL 41*  CKMB 1.9   BNP (last 3 results) No results for input(s): PROBNP in the last 8760 hours. HbA1C: No results for input(s): HGBA1C in the last 72 hours. CBG: No results for input(s): GLUCAP in the last 168 hours. Lipid Profile: No results for input(s): CHOL, HDL, LDLCALC, TRIG, CHOLHDL, LDLDIRECT in the last 72 hours. Thyroid Function Tests: No results for input(s): TSH, T4TOTAL, FREET4, T3FREE, THYROIDAB in the last 72 hours. Anemia Panel: No results for input(s): VITAMINB12, FOLATE, FERRITIN, TIBC, IRON, RETICCTPCT in the last 72 hours. Sepsis Labs: Recent Labs  Lab 04/25/19 2033 04/25/19 2225  LATICACIDVEN 1.6 2.2*    Recent Results (from the past 240 hour(s))  Urine Culture     Status: Abnormal   Collection  Time: 04/25/19  5:47 PM   Specimen: Urine, Random  Result Value Ref Range Status   Specimen Description   Final    URINE, RANDOM Performed at [facility] [address] [place], [place] [address]    Special Requests   Final    RIGHT NEPHROSTMY Performed at [facility] [address], [place], [place] [address]    Culture MULTIPLE SPECIES PRESENT, SUGGEST RECOLLECTION (A)  Final   Report Status 04/26/2019 FINAL  Final  Urine culture     Status: Abnormal   Collection Time: 04/25/19  5:49 PM   Specimen: Urine, Random  Result Value Ref Range Status   Specimen Description   Final    URINE, RANDOM Performed at [facility] [name] [place], [place] [address]    Special Requests   Final    NONE Performed at [facility], [place] [address] [place], [place] [address]    Culture MULTIPLE SPECIES PRESENT, SUGGEST RECOLLECTION (A)  Final   Report Status 04/26/2019 FINAL  Final  Respiratory Panel by RT PCR (Flu A&B, Covid) - Nasopharyngeal Swab     Status: None   Collection Time: 04/25/19  6:53 PM   Specimen: Nasopharyngeal Swab  Result Value Ref Range Status   SARS Coronavirus 2 by RT PCR NEGATIVE NEGATIVE Final    Comment: (NOTE) SARS-CoV-2 target nucleic acids are NOT DETECTED. The SARS-CoV-2 RNA is generally detectable in upper respiratoy specimens during the acute phase of infection. The lowest concentration of SARS-CoV-2 viral copies this assay can detect is 131 copies/mL. A negative result does not preclude SARS-Cov-2 infection and should not be used as the sole basis for treatment or other patient management decisions. A negative result may occur with  improper specimen collection/handling, submission of specimen other than nasopharyngeal swab, presence of viral mutation(s) within the areas targeted by this assay, and inadequate number of viral copies (<131 copies/mL). A negative result must  be combined with clinical observations, patient history, and epidemiological information. The expected result is Negative. Fact Sheet for Patients:  PinkCheek.be Fact Sheet for Healthcare Providers:  GravelBags.it This test is not yet ap proved or cleared  by the [place] and  has been authorized for detection and/or diagnosis of SARS-CoV-2 by FDA under an Emergency Use Authorization (EUA). This EUA will remain  in effect (meaning this test can be used) for the duration of the COVID-19 declaration under Section 564(b)(1) of the Act, 21 U.S.C. section 360bbb-3(b)(1), unless the authorization is terminated or revoked sooner.    Influenza A by PCR NEGATIVE NEGATIVE Final   Influenza B by PCR NEGATIVE NEGATIVE Final    Comment: (NOTE) The Xpert Xpress SARS-CoV-2/FLU/RSV assay is intended as an aid in  the diagnosis of influenza from Nasopharyngeal swab specimens and  should not be used as a sole basis for treatment. Nasal washings and  aspirates are unacceptable for Xpert Xpress SARS-CoV-2/FLU/RSV  testing. Fact Sheet for Patients: PinkCheek.be Fact Sheet for Healthcare Providers: GravelBags.it This test is not yet approved or cleared by the [place] FDA and  has been authorized for detection and/or diagnosis of SARS-CoV-2 by  FDA under an Emergency Use Authorization (EUA). This EUA will remain  in effect (meaning this test can be used) for the duration of the  Covid-19 declaration under Section 564(b)(1) of the Act, 21  U.S.C. section 360bbb-3(b)(1), unless the authorization is  terminated or revoked. Performed at [facility], [place] [address] [place], [place] [address]   Blood culture (routine x 2)     Status: Abnormal   Collection Time: 04/25/19  8:29 PM   Specimen: BLOOD  Result Value Ref Range Status   Specimen Description BLOOD  LEFT ANTECUBITAL  Final   Special Requests   Final    BOTTLES DRAWN AEROBIC AND ANAEROBIC Blood Culture adequate volume Performed at [facility] [address], [place], [place] [address]    Culture  Setup Time   Final    GRAM POSITIVE COCCI IN BOTH AEROBIC AND ANAEROBIC BOTTLES CRITICAL RESULT CALLED TO, READ BACK BY AND VERIFIED WITH: JUSTIN LEGGE @1455  04/26/19 AKT    Culture (A)  Final    ENTEROCOCCUS CASSELIFLAVUS SUSCEPTIBILITIES PERFORMED ON PREVIOUS CULTURE WITHIN THE LAST 5 DAYS. Performed at [facility], [place] [address] [place], [place] [address]    Report Status 04/28/2019 FINAL  Final  Blood culture (routine x 2)     Status: Abnormal   Collection Time: 04/25/19  8:33 PM   Specimen: BLOOD RIGHT FOREARM  Result Value Ref Range Status   Specimen Description BLOOD RIGHT FOREARM  Final   Special Requests   Final    BOTTLES DRAWN AEROBIC AND ANAEROBIC Blood Culture adequate volume Performed at [facility] [address] [place], [place] [address]    Culture  Setup Time   Final    GRAM POSITIVE COCCI IN BOTH AEROBIC AND ANAEROBIC BOTTLES CRITICAL VALUE NOTED.  VALUE IS CONSISTENT WITH PREVIOUSLY REPORTED AND CALLED VALUE. AKT Organism ID to follow CRITICAL RESULT CALLED TO, READ BACK BY AND VERIFIED WITH: Christean Grief [facility] 04/26/19 1750 JDW Performed at [facility], [place] [address] [place], [place] [address]    Culture ENTEROCOCCUS CASSELIFLAVUS (A)  Final   Report Status 04/28/2019 FINAL  Final   Organism ID, Bacteria ENTEROCOCCUS CASSELIFLAVUS  Final      Susceptibility   Enterococcus casseliflavus - MIC*    AMPICILLIN <=2 SENSITIVE Sensitive     VANCOMYCIN RESISTANT Resistant     GENTAMICIN SYNERGY SENSITIVE Sensitive     LINEZOLID 2 SENSITIVE Sensitive     * ENTEROCOCCUS CASSELIFLAVUS  Blood Culture ID Panel (Reflexed)     Status: Abnormal   Collection Time: 04/25/19  8:33 PM  Result Value Ref Range Status    Enterococcus species DETECTED (A) NOT DETECTED Final    Comment: CRITICAL RESULT CALLED TO, READ BACK BY AND VERIFIED WITH: J LEGGE PHARMD 04/26/19 1750 JDW    Vancomycin resistance NOT DETECTED NOT DETECTED Final   Listeria monocytogenes NOT DETECTED NOT DETECTED Final   Staphylococcus species NOT DETECTED NOT DETECTED Final   Staphylococcus aureus (BCID) NOT DETECTED NOT DETECTED Final   Streptococcus species NOT DETECTED NOT DETECTED Final   Streptococcus agalactiae NOT DETECTED NOT DETECTED Final   Streptococcus pneumoniae NOT DETECTED NOT DETECTED Final   Streptococcus pyogenes NOT DETECTED NOT DETECTED Final   Acinetobacter baumannii NOT DETECTED NOT DETECTED Final   Enterobacteriaceae species NOT DETECTED NOT DETECTED Final   Enterobacter cloacae complex NOT DETECTED NOT DETECTED Final   Escherichia coli NOT DETECTED NOT DETECTED Final   Klebsiella oxytoca NOT DETECTED NOT DETECTED Final   Klebsiella pneumoniae NOT DETECTED NOT DETECTED Final   Proteus species NOT DETECTED NOT DETECTED Final   Serratia marcescens NOT DETECTED NOT DETECTED Final   Haemophilus influenzae NOT DETECTED NOT DETECTED Final   Neisseria meningitidis NOT DETECTED NOT DETECTED Final   Pseudomonas aeruginosa NOT DETECTED NOT DETECTED Final   Candida albicans NOT DETECTED NOT DETECTED Final   Candida glabrata NOT DETECTED NOT DETECTED Final   Candida krusei NOT DETECTED NOT DETECTED Final   Candida parapsilosis NOT DETECTED NOT DETECTED Final   Candida tropicalis NOT DETECTED NOT DETECTED Final    Comment: Performed at [facility], [address], [place], [place] [address]  Culture, blood (routine x 2)     Status: None (Preliminary result)   Collection Time: 04/27/19 10:14 AM   Specimen: BLOOD LEFT HAND  Result Value Ref Range Status   Specimen Description   Final    BLOOD LEFT HAND Performed at [facility] [address] [place], [place] [address]    Special Requests    Final    BOTTLES DRAWN AEROBIC ONLY Blood Culture adequate volume Performed at [facility] [address], [place], [place] [address]    Culture   Final    NO GROWTH < 24 HOURS Performed at [facility] [address], [place], [place] [address]    Report Status PENDING  Incomplete  Culture, blood (routine x 2)     Status: None (Preliminary result)   Collection Time: 04/27/19 10:14 AM   Specimen: BLOOD  Result Value Ref Range Status   Specimen Description   Final    BLOOD LEFT ARM Performed at [facility] [address], [place], [place] [address]    Special Requests   Final    BOTTLES DRAWN AEROBIC ONLY Blood Culture adequate volume Performed at [facility] [address] [place], [place] [address]    Culture   Final    NO GROWTH < 24 HOURS Performed at [facility] [address] [place], [place] [address]    Report Status PENDING  Incomplete         Radiology Studies: DG Chest 2 View  Result Date: 04/27/2019 CLINICAL DATA:  Pleural effusion, prior abnormal chest x-ray EXAM: CHEST - 2 VIEW COMPARISON:  04/25/2019, 04/26/2019 FINDINGS: Frontal and lateral views of the chest demonstrate small right pleural effusion with blunting of the posterior right costophrenic  angle. The rounded area of consolidation within the lateral segment right middle lobe seen on prior CT is again identified and unchanged. No pneumothorax. Cardiac silhouette is enlarged but stable. Dual lead pacemaker unchanged. IMPRESSION: 1. Stable small right pleural effusion. 2. Stable rounded consolidation right middle lobe. Please see prior CT chest discussion. Electronically Signed   By: Randa Ngo M.D.   On: 04/27/2019 11:24   DG ERCP BILIARY & PANCREATIC DUCTS  Result Date: 04/27/2019 CLINICAL DATA:  ERCP with sphincterotomy and removal of multiple CBD stones. EXAM: ERCP TECHNIQUE: Multiple spot images obtained with the  fluoroscopic device and submitted for interpretation post-procedure. COMPARISON:  CT abdomen pelvis-04/25/2019 FLUOROSCOPY TIME:  4 minutes, 56 seconds (93.85 mGy) FINDINGS: Seven spot intraoperative fluoroscopic images of the right upper abdominal quadrant during ERCP are provided for review Initial image demonstrates the superior end of the patient's chronic right-sided percutaneous nephrostomy catheter Subsequent images demonstrate an ERCP probe overlying the right upper abdominal quadrant. There is selective cannulation opacification of the common bile duct which appears moderately dilated. There are at least 2 persistent nonocclusive filling defects within the common bile duct favored to represent choledocholithiasis Subsequent images demonstrate insufflation of a balloon within the central aspect of the CBD with subsequent biliary sweeping and presumed sphincterotomy. There is faint opacification of cystic duct with opacification of the gallbladder lumen. There are multiple persistent filling defects within the opacified gallbladder lumen compatible known cholelithiasis. IMPRESSION: 1. Choledocholithiasis with subsequent biliary sweeping and stone extraction. 2. Cholelithiasis. These images were submitted for radiologic interpretation only. Please see the procedural report for the amount of contrast and the fluoroscopy time utilized. Electronically Signed   By: Sandi Mariscal M.D.   On: 04/27/2019 16:00   ECHOCARDIOGRAM COMPLETE  Result Date: 04/28/2019   ECHOCARDIOGRAM REPORT   Patient Name:   DAVID RODRIQUEZ Date of Exam: 04/28/2019 Medical Rec #:  [id number]          Height:       67.0 in Accession #:    [id number]         Weight:       159.4 lb Date of Birth:  1921/11/05           BSA:          1.84 m Patient Age:    89 years           BP:           155/75 mmHg Patient Gender: M                  HR:           54 bpm. Exam Location:  Inpatient Procedure: 2D Echo, Cardiac Doppler and Color Doppler                                  MODIFIED REPORT:     This report was modified by Cherlynn Kaiser MD on 04/28/2019 due to added                              comparison statement.  Indications:     Bacteremia.  History:         Patient has prior history of Echocardiogram examinations, most                  recent 12/10/2018. CHF, Abnormal ECG and  Pacemaker; Risk                  Factors:Former Smoker and Hypertension. Cancer. Aortic                  stenosis.  Sonographer:     Roseanna Rainbow RDCS Referring Phys:  [place] Diagnosing Phys: Cherlynn Kaiser MD IMPRESSIONS  1. Left ventricular ejection fraction, by visual estimation, is 25 to 30%. The left ventricle has severely decreased function. There is moderately increased left ventricular hypertrophy.  2. Elevated left ventricular end-diastolic pressure.  3. Global right ventricle has mildly reduced systolic function.The right ventricular size is normal. No increase in right ventricular wall thickness.  4. Moderately elevated pulmonary artery systolic pressure, RVSP 47 mmHg.  5. Left atrial size was moderately dilated.  6. The aortic valve is abnormal. Aortic valve regurgitation is mild to moderate. Severe, low flow low gradient aortic valve stenosis. Systolic mean gradient is approximately 38 mmHg. Dimensionless index 0.21. There is severe calcifcation of the aortic valve. Severely restricted leaflet motion.  7. Mild mitral annular calcification.  8. The mitral valve is abnormal. Mild to moderate mitral valve regurgitation. No evidence of mitral stenosis.  9. The tricuspid valve is normal in structure. Tricuspid valve regurgitation is moderate. 10. There is mild dilatation of the ascending aorta measuring 40 mm. 11. The inferior vena cava is normal in size with <50% respiratory variability, suggesting right atrial pressure of 8 mmHg. 12. Trivial pericardial effusion is present. Moderate pleural effusion. 13. The left ventricle demonstrates global hypokinesis. 14. Side by side  comparison of images performed to study from 12/10/2018. LV function has not significantly changed. Aortic stenosis is now severe. AR, MR, and TR appear to have increased. FINDINGS  Left Ventricle: Left ventricular ejection fraction, by visual estimation, is 25 to 30%. The left ventricle has severely decreased function. The left ventricle demonstrates global hypokinesis. There is moderately increased left ventricular hypertrophy. Left ventricular diastolic parameters are indeterminate. Elevated left ventricular end-diastolic pressure. Right Ventricle: The right ventricular size is normal. No increase in right ventricular wall thickness. Global RV systolic function is has mildly reduced systolic function. The tricuspid regurgitant velocity is 3.11 m/s, and with an assumed right atrial pressure of 8 mmHg, the estimated right ventricular systolic pressure is moderately elevated at 46.6 mmHg. Left Atrium: Left atrial size was moderately dilated. Right Atrium: Right atrial size was normal in size Pericardium: Trivial pericardial effusion is present. There is a moderate pleural effusion in either the left or right lateral region. Mitral Valve: The mitral valve is abnormal. There is mild thickening of the mitral valve leaflet(s). Mild mitral annular calcification. Mild to moderate mitral valve regurgitation. No evidence of mitral valve stenosis by observation. Tricuspid Valve: The tricuspid valve is normal in structure. Tricuspid valve regurgitation is moderate. Aortic Valve: The aortic valve is abnormal. Aortic valve regurgitation is mild to moderate. Aortic regurgitation PHT measures 608 msec. Severe aortic stenosis is present. There is severe calcifcation of the aortic valve. Aortic valve mean gradient measures 37.5 mmHg. Aortic valve peak gradient measures 62.3 mmHg. Aortic valve area, by VTI measures 0.61 cm. Pulmonic Valve: The pulmonic valve was normal in structure. Pulmonic valve regurgitation is trivial. Pulmonic  regurgitation is trivial. Aorta: Aortic dilatation noted. There is mild dilatation of the ascending aorta measuring 40 mm. Venous: The inferior vena cava is normal in size with less than 50% respiratory variability, suggesting right atrial pressure of 8 mmHg. IAS/Shunts: The interatrial  septum was not well visualized. Additional Comments: A pacer wire is visualized in the right atrium and right ventricle.  LEFT VENTRICLE PLAX 2D LVIDd:         4.71 cm       Diastology LVIDs:         4.14 cm       LV e' medial:   2.81 cm/s LV PW:         1.49 cm       LV E/e' medial: 38.0 LV IVS:        1.38 cm LVOT diam:     2.10 cm LV SV:         27 ml LV SV Index:   14.49 LVOT Area:     3.46 cm  LV Volumes (MOD) LV area d, A2C:    37.10 cm LV area d, A4C:    38.40 cm LV area s, A2C:    30.60 cm LV area s, A4C:    33.80 cm LV major d, A2C:   9.25 cm LV major d, A4C:   8.65 cm LV major s, A2C:   8.72 cm LV major s, A4C:   8.42 cm LV vol d, MOD A2C: 122.0 ml LV vol d, MOD A4C: 138.0 ml LV vol s, MOD A2C: 87.7 ml LV vol s, MOD A4C: 109.0 ml LV SV MOD A2C:     34.3 ml LV SV MOD A4C:     138.0 ml LV SV MOD BP:      34.3 ml RIGHT VENTRICLE TAPSE (M-mode): 2.1 cm LEFT ATRIUM             Index       RIGHT ATRIUM           Index LA diam:        3.70 cm 2.01 cm/m  RA Area:     17.60 cm LA Vol (A2C):   86.7 ml 47.21 ml/m RA Volume:   48.00 ml  26.14 ml/m LA Vol (A4C):   71.4 ml 38.88 ml/m LA Biplane Vol: 85.6 ml 46.62 ml/m  AORTIC VALVE                    PULMONIC VALVE AV Area (Vmax):    0.62 cm     PR End Diast Vel: 1.80 msec AV Area (Vmean):   0.55 cm AV Area (VTI):     0.61 cm AV Vmax:           394.60 cm/s AV Vmean:          283.400 cm/s AV VTI:            1.084 m AV Peak Grad:      62.3 mmHg AV Mean Grad:      37.5 mmHg LVOT Vmax:         70.70 cm/s LVOT Vmean:        45.400 cm/s LVOT VTI:          0.191 m LVOT/AV VTI ratio: 0.18 AI PHT:            608 msec  AORTA Ao Root diam: 3.00 cm Ao Asc diam:  4.00 cm MITRAL VALVE                          TRICUSPID VALVE MV Area (PHT): 2.87 cm              TR Peak grad:  38.6 mmHg MV PHT:        76.56 msec            TR Vmax:        326.00 cm/s MV Decel Time: 264 msec MR Peak grad:    132.7 mmHg          SHUNTS MR Mean grad:    78.0 mmHg           Systemic VTI:  0.19 m MR Vmax:         576.00 cm/s         Systemic Diam: 2.10 cm MR Vmean:        405.0 cm/s MR PISA:         1.01 cm MR PISA Eff ROA: 6 mm MR PISA Radius:  0.40 cm MV E velocity: 106.67 cm/s 103 cm/s MV A velocity: 100.65 cm/s 70.3 cm/s MV E/A ratio:  1.06        1.5  Cherlynn Kaiser MD Electronically signed by Cherlynn Kaiser MD Signature Date/Time: 04/28/2019/3:22:46 PM    Final (Updated)         Scheduled Meds: . aspirin EC  81 mg Oral Daily  . cholecalciferol  1,000 Units Oral Daily  . docusate sodium  300 mg Oral Daily  . enoxaparin (LOVENOX) injection  30 mg Subcutaneous QHS  . febuxostat  40 mg Oral Daily  . [START ON 04/29/2019] furosemide  20 mg Oral Daily  . levothyroxine  25 mcg Oral Q0600  . metoprolol succinate  12.5 mg Oral Daily  . multivitamin  1 tablet Oral Daily  . pantoprazole (PROTONIX) IV  40 mg Intravenous QHS   Continuous Infusions: . piperacillin-tazobactam (ZOSYN)  IV 2.25 g (04/28/19 1641)     LOS: 3 days    Time spent: 30 minutes with over 50% of the time coordinating the patient's care    Harold Hedge, DO [facility] Pager [phone]  If 7PM-7AM, please contact night-coverage [email or web address] Password [facility] 04/28/2019, 4:47 PM

## 2019-04-28 NOTE — Progress Notes (Signed)
  Echocardiogram 2D Echocardiogram has been performed.  [name] [name] 04/28/2019, 10:01 AM

## 2019-04-28 NOTE — Progress Notes (Signed)
Pharmacy Antibiotic Note  [name] [name] is a 86 y.o. male with hx bladder cancer, right-sided nephrostomy tube and s/p urostomy presented to the ED on 04/25/2019 with c/o left flank pain and abdominal pain. Abd CT showed choledocholithiasis.  He underwent ERCP with sphincterotomy, dilation of pylorus and removal of stones on 2/2.  Patient was started on vancomycin and zosyn on admission. Bcx grew out enterococcus casselifavus. Vancomycin d/ced on 2/1. ID recommends to continue with zosyn for now.  Today, 04/28/2019: - day #3 abx - afeb, wbc down wnl, CKD3 -  scr  trending up 2.42 (crcl~17); currently on lasix 20 mg PO q48h  Plan: - continue zosyn 2.25 gm IV q6h - monitor renal function - f/u suscept. for enteroccoccus - F/u results for TTE  ____________________________________  Height: 5\' 7"  (170.2 cm) Weight: 159 lb 6.3 oz (72.3 kg) IBW/kg (Calculated) : 66.1  Temp (24hrs), Avg:97.9 F (36.6 C), Min:97.5 F (36.4 C), Max:98.4 F (36.9 C)  Recent Labs  Lab 04/25/19 1749 04/25/19 2033 04/25/19 2225 04/26/19 0425 04/27/19 0419 04/28/19 0449  WBC 15.2*  --   --  21.8* 10.5 7.1  CREATININE 2.03*  --   --  2.04* 2.25* 2.42*  LATICACIDVEN  --  1.6 2.2*  --   --   --     Estimated Creatinine Clearance: 17.1 mL/min (A) (by C-G formula based on SCr of 2.42 mg/dL (H)).    No Known Allergies  Thank you for allowing pharmacy to be a part of this patient's care.  Lynelle Doctor 04/28/2019 8:27 AM

## 2019-04-28 NOTE — Progress Notes (Signed)
Patient ID: [name] [name], male   DOB: 11/06/1921, 86 y.o.   MRN: [id number]         [facility] for Infectious Disease  Date of Admission:  04/25/2019           Day 3 piperacillin tazobactam ASSESSMENT: I suspect that his enterococcal bacteremia was due to cholangitis rather than a UTI.  I recommend continuing piperacillin tazobactam for initial therapy.  He will need a longer course of therapy for his enterococcal bacteremia.  Repeat blood cultures are negative at 24 hours.  There were no vegetations seen on TTE but with his severe calcific aortic stenosis and pacemaker he is certainly at high risk for endocarditis.  I talked to Mr. Tugwell and his daughter about management options including proceeding with TEE to determine optimal duration and type of antibiotic therapy versus treating empirically for possible enterococcal endocarditis.  I suggested that we consult cardiology to help [place] determine an optimal management strategy.  PLAN: 1. Continue piperacillin tazobactam for now 2. Recommend cardiology consult tomorrow  Principal Problem:   Enterococcal bacteremia Active Problems:   Acute lower UTI   Sepsis ([place])   Choledocholithiasis   Hypertension   Pacemaker   Aortic stenosis   CKD (chronic kidney disease), stage IV (HCC)   Bladder cancer (HCC)   Acute on chronic diastolic CHF (congestive heart failure) (HCC)   Abnormal liver function   Status post ileal conduit (HCC)   Macrocytic anemia   HOH (hard of hearing)   History of prostate cancer   Scheduled Meds: . aspirin EC  81 mg Oral Daily  . cholecalciferol  1,000 Units Oral Daily  . docusate sodium  300 mg Oral Daily  . enoxaparin (LOVENOX) injection  30 mg Subcutaneous QHS  . febuxostat  40 mg Oral Daily  . [START ON 04/29/2019] furosemide  20 mg Oral Daily  . levothyroxine  25 mcg Oral Q0600  . metoprolol succinate  12.5 mg Oral Daily  . multivitamin  1 tablet Oral Daily  . pantoprazole (PROTONIX) IV  40 mg  Intravenous QHS   Continuous Infusions: . piperacillin-tazobactam (ZOSYN)  IV 2.25 g (04/28/19 1641)   PRN Meds:.colchicine, ondansetron (ZOFRAN) IV, polyethylene glycol, polyvinyl alcohol, traMADol   SUBJECTIVE: Mr. Clearman underwent ERCP, sphincterotomy and removal of multiple common bile duct stones yesterday.  His back pain resolved after the procedure.    Review of Systems: Review of Systems  Constitutional: Negative for fever.  Respiratory: Negative for cough and shortness of breath.   Cardiovascular: Negative for chest pain.  Gastrointestinal: Negative for abdominal pain, diarrhea, nausea and vomiting.  Musculoskeletal: Negative for back pain.    No Known Allergies  OBJECTIVE: Vitals:   04/27/19 2023 04/28/19 0000 04/28/19 0344 04/28/19 0500  BP: (!) 169/71 (!) 159/75 (!) 155/75   Pulse:  65 62   Resp:  18 18   Temp:  97.7 F (36.5 C) 97.9 F (36.6 C)   TempSrc:  Oral    SpO2:  99% 100%   Weight:    72.3 kg  Height:       Body mass index is 24.96 kg/m.  Physical Exam Constitutional:      Comments: He is resting quietly and comfortably in bed.  His daughter is at the bedside.  Cardiovascular:     Rate and Rhythm: Normal rate and regular rhythm.     Heart sounds: Murmur present.     Comments: Pacemaker site is okay. Pulmonary:  Effort: Pulmonary effort is normal.     Breath sounds: Normal breath sounds.  Abdominal:     Palpations: Abdomen is soft.     Tenderness: There is no abdominal tenderness.     Lab Results Lab Results  Component Value Date   WBC 7.1 04/28/2019   HGB 9.8 (L) 04/28/2019   HCT 32.1 (L) 04/28/2019   MCV 105.2 (H) 04/28/2019   PLT 178 04/28/2019    Lab Results  Component Value Date   CREATININE 2.42 (H) 04/28/2019   BUN 43 (H) 04/28/2019   NA 137 04/28/2019   K 5.5 (H) 04/28/2019   CL 109 04/28/2019   CO2 20 (L) 04/28/2019    Lab Results  Component Value Date   ALT 162 (H) 04/28/2019   AST 142 (H) 04/28/2019    ALKPHOS 152 (H) 04/28/2019   BILITOT 3.0 (H) 04/28/2019     Microbiology: Recent Results (from the past 240 hour(s))  Urine Culture     Status: Abnormal   Collection Time: 04/25/19  5:47 PM   Specimen: Urine, Random  Result Value Ref Range Status   Specimen Description   Final    URINE, RANDOM Performed at [facility], [place] [address], [place], [place] [address]    Special Requests   Final    RIGHT NEPHROSTMY Performed at [place] [address], [place], [place] [address]    Culture MULTIPLE SPECIES PRESENT, SUGGEST RECOLLECTION (A)  Final   Report Status 04/26/2019 FINAL  Final  Urine culture     Status: Abnormal   Collection Time: 04/25/19  5:49 PM   Specimen: Urine, Random  Result Value Ref Range Status   Specimen Description   Final    URINE, RANDOM Performed at [place] [address] [place] [address], [place], [place] [address]    Special Requests   Final    NONE Performed at [facility], [place] [address] [place], [place] [address]    Culture MULTIPLE SPECIES PRESENT, SUGGEST RECOLLECTION (A)  Final   Report Status 04/26/2019 FINAL  Final  Respiratory Panel by RT PCR (Flu A&B, Covid) - Nasopharyngeal Swab     Status: None   Collection Time: 04/25/19  6:53 PM   Specimen: Nasopharyngeal Swab  Result Value Ref Range Status   SARS Coronavirus 2 by RT PCR NEGATIVE NEGATIVE Final    Comment: (NOTE) SARS-CoV-2 target nucleic acids are NOT DETECTED. The SARS-CoV-2 RNA is generally detectable in upper respiratoy specimens during the acute phase of infection. The lowest concentration of SARS-CoV-2 viral copies this assay can detect is 131 copies/mL. A negative result does not preclude SARS-Cov-2 infection and should not be used as the sole basis for treatment or other patient management decisions. A negative result may occur with  improper specimen collection/handling, submission of specimen other than  nasopharyngeal swab, presence of viral mutation(s) within the areas targeted by this assay, and inadequate number of viral copies (<131 copies/mL). A negative result must be combined with clinical observations, patient history, and epidemiological information. The expected result is Negative. Fact Sheet for Patients:  PinkCheek.be Fact Sheet for Healthcare Providers:  GravelBags.it This test is not yet ap proved or cleared by the [place] FDA and  has been authorized for detection and/or diagnosis of SARS-CoV-2 by FDA under an Emergency Use Authorization (EUA). This EUA will remain  in effect (meaning this test can be used) for the duration of the COVID-19 declaration under Section 564(b)(1) of  the Act, 21 U.S.C. section 360bbb-3(b)(1), unless the authorization is terminated or revoked sooner.    Influenza A by PCR NEGATIVE NEGATIVE Final   Influenza B by PCR NEGATIVE NEGATIVE Final    Comment: (NOTE) The Xpert Xpress SARS-CoV-2/FLU/RSV assay is intended as an aid in  the diagnosis of influenza from Nasopharyngeal swab specimens and  should not be used as a sole basis for treatment. Nasal washings and  aspirates are unacceptable for Xpert Xpress SARS-CoV-2/FLU/RSV  testing. Fact Sheet for Patients: PinkCheek.be Fact Sheet for Healthcare Providers: GravelBags.it This test is not yet approved or cleared by the [place] FDA and  has been authorized for detection and/or diagnosis of SARS-CoV-2 by  FDA under an Emergency Use Authorization (EUA). This EUA will remain  in effect (meaning this test can be used) for the duration of the  Covid-19 declaration under Section 564(b)(1) of the Act, 21  U.S.C. section 360bbb-3(b)(1), unless the authorization is  terminated or revoked. Performed at [facility], [place] [address], [place], [place] [address]    Blood culture (routine x 2)     Status: Abnormal   Collection Time: 04/25/19  8:29 PM   Specimen: BLOOD  Result Value Ref Range Status   Specimen Description BLOOD LEFT ANTECUBITAL  Final   Special Requests   Final    BOTTLES DRAWN AEROBIC AND ANAEROBIC Blood Culture adequate volume Performed at [place] [address], [place], [place] [address]    Culture  Setup Time   Final    GRAM POSITIVE COCCI IN BOTH AEROBIC AND ANAEROBIC BOTTLES CRITICAL RESULT CALLED TO, READ BACK BY AND VERIFIED WITH: JUSTIN LEGGE @1455  04/26/19 AKT    Culture (A)  Final    ENTEROCOCCUS CASSELIFLAVUS SUSCEPTIBILITIES PERFORMED ON PREVIOUS CULTURE WITHIN THE LAST 5 DAYS. Performed at [facility], [place] [address] [place], [place] [address]    Report Status 04/28/2019 FINAL  Final  Blood culture (routine x 2)     Status: Abnormal   Collection Time: 04/25/19  8:33 PM   Specimen: BLOOD RIGHT FOREARM  Result Value Ref Range Status   Specimen Description BLOOD RIGHT FOREARM  Final   Special Requests   Final    BOTTLES DRAWN AEROBIC AND ANAEROBIC Blood Culture adequate volume Performed at [place] [address] [place], [place] [address]    Culture  Setup Time   Final    GRAM POSITIVE COCCI IN BOTH AEROBIC AND ANAEROBIC BOTTLES CRITICAL VALUE NOTED.  VALUE IS CONSISTENT WITH PREVIOUSLY REPORTED AND CALLED VALUE. AKT Organism ID to follow CRITICAL RESULT CALLED TO, READ BACK BY AND VERIFIED WITH: Christean Grief [facility] 04/26/19 1750 JDW Performed at [facility], [place] [address], [place],  [address]    Culture ENTEROCOCCUS CASSELIFLAVUS (A)  Final   Report Status 04/28/2019 FINAL  Final   Organism ID, Bacteria ENTEROCOCCUS CASSELIFLAVUS  Final      Susceptibility   Enterococcus casseliflavus - MIC*    AMPICILLIN <=2 SENSITIVE Sensitive     VANCOMYCIN RESISTANT Resistant     GENTAMICIN SYNERGY SENSITIVE Sensitive     LINEZOLID 2 SENSITIVE  Sensitive     * ENTEROCOCCUS CASSELIFLAVUS  Blood Culture ID Panel (Reflexed)     Status: Abnormal   Collection Time: 04/25/19  8:33 PM  Result Value Ref Range Status   Enterococcus species DETECTED (A) NOT DETECTED Final    Comment: CRITICAL RESULT CALLED TO, READ BACK BY AND VERIFIED  WITH: J LEGGE PHARMD 04/26/19 1750 JDW    Vancomycin resistance NOT DETECTED NOT DETECTED Final   Listeria monocytogenes NOT DETECTED NOT DETECTED Final   Staphylococcus species NOT DETECTED NOT DETECTED Final   Staphylococcus aureus (BCID) NOT DETECTED NOT DETECTED Final   Streptococcus species NOT DETECTED NOT DETECTED Final   Streptococcus agalactiae NOT DETECTED NOT DETECTED Final   Streptococcus pneumoniae NOT DETECTED NOT DETECTED Final   Streptococcus pyogenes NOT DETECTED NOT DETECTED Final   Acinetobacter baumannii NOT DETECTED NOT DETECTED Final   Enterobacteriaceae species NOT DETECTED NOT DETECTED Final   Enterobacter cloacae complex NOT DETECTED NOT DETECTED Final   Escherichia coli NOT DETECTED NOT DETECTED Final   Klebsiella oxytoca NOT DETECTED NOT DETECTED Final   Klebsiella pneumoniae NOT DETECTED NOT DETECTED Final   Proteus species NOT DETECTED NOT DETECTED Final   Serratia marcescens NOT DETECTED NOT DETECTED Final   Haemophilus influenzae NOT DETECTED NOT DETECTED Final   Neisseria meningitidis NOT DETECTED NOT DETECTED Final   Pseudomonas aeruginosa NOT DETECTED NOT DETECTED Final   Candida albicans NOT DETECTED NOT DETECTED Final   Candida glabrata NOT DETECTED NOT DETECTED Final   Candida krusei NOT DETECTED NOT DETECTED Final   Candida parapsilosis NOT DETECTED NOT DETECTED Final   Candida tropicalis NOT DETECTED NOT DETECTED Final    Comment: Performed at [facility], [address], [place], [place] [address]  Culture, blood (routine x 2)     Status: None (Preliminary result)   Collection Time: 04/27/19 10:14 AM   Specimen: BLOOD LEFT HAND  Result Value Ref Range  Status   Specimen Description   Final    BLOOD LEFT HAND Performed at [place] [address] [place], [place] [address]    Special Requests   Final    BOTTLES DRAWN AEROBIC ONLY Blood Culture adequate volume Performed at [place] [address] [place], [place] [address]    Culture   Final    NO GROWTH < 24 HOURS Performed at [place] [address], [place], [place] [address]    Report Status PENDING  Incomplete  Culture, blood (routine x 2)     Status: None (Preliminary result)   Collection Time: 04/27/19 10:14 AM   Specimen: BLOOD  Result Value Ref Range Status   Specimen Description   Final    BLOOD LEFT ARM Performed at [place] [address], [place], [place] [address]    Special Requests   Final    BOTTLES DRAWN AEROBIC ONLY Blood Culture adequate volume Performed at [place] [address], [place], [place] [address]    Culture   Final    NO GROWTH < 24 HOURS Performed at [facility] [address] [place], [place] [address]    Report Status PENDING  Incomplete    Michel Bickers, MD [facility] for Infectious [place] [phone] pager   [phone] cell 04/28/2019, 6:01 PM

## 2019-04-28 NOTE — Progress Notes (Signed)
[facility] Gastroenterology Progress Note  [name] [name] 86 y.o. 1921-08-17   Subjective: Complaining of mild epigastric pain. Bedside echo being done.  Objective: Vital signs: Vitals:   04/28/19 0000 04/28/19 0344  BP: (!) 159/75 (!) 155/75  Pulse: 65 62  Resp: 18 18  Temp: 97.7 F (36.5 C) 97.9 F (36.6 C)  SpO2: 99% 100%    Physical Exam: Gen: lethargic, elderly, no acute distress  HEENT: +icteric sclera CV: RRR Chest: CTA B Abd: RUQ tenderness with minimal guarding, soft, nondistended, +BS, urostomy bag Ext: no edema  Lab Results: Recent Labs    04/27/19 0419 04/28/19 0449  NA 138 137  K 4.6 5.5*  CL 107 109  CO2 21* 20*  GLUCOSE 92 168*  BUN 43* 43*  CREATININE 2.25* 2.42*  CALCIUM 7.7* 7.5*   Recent Labs    04/27/19 0419 04/28/19 0449  AST 231* 142*  ALT 196* 162*  ALKPHOS 137* 152*  BILITOT 3.3* 3.0*  PROT 5.9* 6.3*  ALBUMIN 2.6* 2.5*   Recent Labs    04/25/19 1749 04/26/19 0425 04/27/19 0419 04/28/19 0449  WBC 15.2*   < > 10.5 7.1  NEUTROABS 13.9*  --  8.6*  --   HGB 11.5*   < > 9.6* 9.8*  HCT 36.0*   < > 30.4* 32.1*  MCV 102.9*   < > 101.7* 105.2*  PLT 231   < > 157 178   < > = values in this interval not displayed.      Assessment/Plan: S/P ERCP with removal of choledocholithiasis and sphincterotomy. Pyloric channel balloon dilation was necessary prior to passage of the duodenoscope for the ERCP. AST/ALT improving. Tolerating liquids and wants to eat. Will change to low fat soft diet. Will follow.  [name] [name] 04/28/2019, 9:53 AM  Questions please call [phone] ID: [name] [name], male   DOB: 07-07-1921, 86 y.o.   MRN: [id number]

## 2019-04-28 NOTE — TOC Progression Note (Signed)
Transition of Care [facility]) - Progression Note    Patient Details  Name: [name] [name] MRN: [id number] Date of Birth: [birth year]  [facility]) CM/SW Contact  Dodd Schmid, Juliann Pulse, RN Phone Number: 04/28/2019, 3:02 PM  Clinical Narrative: [facility] rep Santiago Glad able to accept-following for d/c-SOC Friday if dc prior.      Expected Discharge Plan: [place] Barriers to Discharge: Continued Medical Work up  Expected Discharge Plan and Services Expected Discharge Plan: Person   Discharge Planning Services: CM Consult   Living arrangements for the past 2 months: Single Family Home                           HH Arranged: PT [place]: [place] ([place]) Date HH Agency Contacted: 04/28/19 Time [place]: 1502 Representative spoke with at [place]: [place] ([place]) Interventions    Readmission Risk Interventions Readmission Risk Prevention Plan 04/26/2019  Transportation Screening Complete  PCP or Specialist Appt within 3-5 Days Complete  HRI or [place] Complete  Social Work Consult for [place] Planning/Counseling Complete  Palliative Care Screening Complete  Medication Review Press photographer) Complete  Some recent data might be hidden

## 2019-04-28 NOTE — Consult Note (Signed)
Consultation Note Date: 04/28/2019   Patient Name: [name] [name]  DOB: 05/04/1921  MRN: [id number]  Age / Sex: 86 y.o., male  PCP: Crist Infante, MD Referring Physician: Harold Hedge, MD  Reason for Consultation: Establishing goals of care  HPI/Patient Profile: 86 y.o. male  with past medical history of   Prostate bladder cancer, ileal conduit/urethral stricture, sinus node dysfunction, has PPM, has severe aortic stenosis, gout, history of DVT admitted since 04/25/2019    Clinical Assessment and Goals of Care:  [name] [name] lives at home, his daughter is his primary caregiver. He has been having falls and weakness. He had previously declined surgery for his aortic stenosis.   Patient now admitted with choledocholithiasis, seen by GI, underwent ERCP with removal of choledocholithiasis and sphincterotomy. As per GI, pyloric channel balloon dilation was necessary prior to passage of the duodenoscope for the ERCP. AST/ALT improving. Tolerating liquids and wants to eat. Will change to low fat soft diet. Will follow.  Patient worked with PT earlier today, tolerated his diet well. He is currently resting in his chair. He is in no distress. Daughter at bedside.   Palliative medicine is specialized medical care for people living with serious illness. It focuses on providing relief from the symptoms and stress of a serious illness. The goal is to improve quality of life for both the patient and the family.  Goals of care: Broad aims of medical therapy in relation to the patient's values and preferences. Our aim is to provide medical care aimed at enabling patients to achieve the goals that matter most to them, given the circumstances of their particular medical situation and their constraints.   Goals, wishes and values important to the patient and family attempted to be explored. We discussed about this current  hospitalization, awaiting ECHO results, ID also following.   We talked about next steps, patient's current condition. Discussed about palliative care following the patient at home. See below.    NEXT OF KIN  lives at home alone, daughter lives next door, son also is involved in his care.   SUMMARY OF RECOMMENDATIONS   Agree with DNR.  Discussed with daughter and patient at bedside: home with home health, PT OT. Would recommend home based palliative care on discharge. Patient also wishes to follow up with cardiology after discharge about options for aortic stenosis treatment options.  Continue current mode of care, current PO. ID also following.  Thank you for the consult.   Code Status/Advance Care Planning:  DNR    Symptom Management:    as above.   Palliative Prophylaxis:   Delirium Protocol   Psycho-social/Spiritual:   Desire for further Chaplaincy support:yes  Additional Recommendations: Caregiving  Support/Resources  Prognosis:   Unable to determine  Discharge Planning: Home with Palliative Services      Primary Diagnoses: Present on Admission: . Sepsis ([place]) . Acute lower UTI . Abnormal liver function . Hypertension . CKD (chronic kidney disease), stage IV ([place]) . Enterococcal bacteremia . Choledocholithiasis . Pacemaker .  Bladder cancer ([place]) . Acute on chronic diastolic CHF (congestive heart failure) ([place]) . Macrocytic anemia   I have reviewed the medical record, interviewed the patient and family, and examined the patient. The following aspects are pertinent.  Past Medical History:  Diagnosis Date  . Aortic valve stenosis    a. mild to moderate by echo 2013  . Bradycardia    MDT dual chamber pacemaker  . Cancer [facility])    bladder cancer  . Chronic kidney disease   . Deep venous thrombosis ([facility])    hx of  LEFT ARM AFTER PACEMAKER INSERTION ABOUT 6 YRS AGO  . Depression   . Gout   . Hypertension   . Kidney stone   . Macular degeneration     PT STATES HIS VISION STILL OKAY FOR DRIVING  . Osteoporosis   . Prostate cancer ([place])    AND BLADDER CANCER - S/P URETEROILEAL CONDUIT  . Renal disorder   . S/P ileal conduit (H[facility])        Social History   Socioeconomic History  . Marital status: Widowed    Spouse name: Not on file  . Number of children: 3  . Years of education: Not on file  . Highest education level: Not on file  Occupational History  . Occupation: IT sales professional: RETIRED  Tobacco Use  . Smoking status: Former Smoker    Types: Cigarettes    Quit date: 03/25/1952    Years since quitting: 67.1  . Smokeless tobacco: Never Used  Substance and Sexual Activity  . Alcohol use: No  . Drug use: No  . Sexual activity: Never  Other Topics Concern  . Not on file  Social History Narrative   ** Merged History Encounter **       Social Determinants of Health   Financial Resource Strain:   . Difficulty of Paying Living Expenses: Not on file  Food Insecurity:   . Worried About Charity fundraiser in the Last Year: Not on file  . Ran Out of Food in the Last Year: Not on file  Transportation Needs:   . Lack of Transportation (Medical): Not on file  . Lack of Transportation (Non-Medical): Not on file  Physical Activity:   . Days of Exercise per Week: Not on file  . Minutes of Exercise per Session: Not on file  Stress:   . Feeling of Stress : Not on file  Social Connections:   . Frequency of Communication with Friends and Family: Not on file  . Frequency of Social Gatherings with Friends and Family: Not on file  . Attends Religious Services: Not on file  . Active Member of Clubs or Organizations: Not on file  . Attends Archivist Meetings: Not on file  . Marital Status: Not on file   Family History  Problem Relation Age of Onset  . Pancreatic cancer Father 75       died  . Pancreatic cancer Mother 33       died   Scheduled Meds: . aspirin EC  81 mg Oral Daily  . cholecalciferol  1,000  Units Oral Daily  . docusate sodium  300 mg Oral Daily  . enoxaparin (LOVENOX) injection  30 mg Subcutaneous QHS  . febuxostat  40 mg Oral Daily  . [START ON 04/29/2019] furosemide  20 mg Oral Daily  . levothyroxine  25 mcg Oral Q0600  . metoprolol succinate  12.5 mg Oral Daily  . multivitamin  1  tablet Oral Daily  . pantoprazole (PROTONIX) IV  40 mg Intravenous QHS   Continuous Infusions: . piperacillin-tazobactam (ZOSYN)  IV 2.25 g (04/28/19 0958)   PRN Meds:.colchicine, ondansetron (ZOFRAN) IV, polyethylene glycol, polyvinyl alcohol, traMADol Medications Prior to Admission:  Prior to Admission medications   Medication Sig Start Date End Date Taking? Authorizing Provider  acetaminophen (TYLENOL) 500 MG tablet Take 1,000 mg by mouth every 8 (eight) hours as needed for mild pain or headache.   Yes [provider]  aspirin EC 81 MG tablet Take 81 mg by mouth every morning.   Yes [provider]  Besifloxacin HCl (BESIVANCE) 0.6 % SUSP Apply 1 drop to eye See admin instructions. Only uses before eye shot he receives every 10 weeks.   Yes [provider]  Cholecalciferol (VITAMIN D-3) 1000 UNITS CAPS Take 1 capsule by mouth every morning.   Yes [provider]  colchicine 0.6 MG tablet Take 0.6 mg by mouth as needed (gout).    Yes [provider]  docusate sodium (COLACE) 100 MG capsule Take 300 mg by mouth daily.    Yes [provider]  furosemide (LASIX) 20 MG tablet Take 1 tablet (20 mg total) by mouth daily. 12/16/18 12/11/19 Yes Burnell Blanks, MD  Hyprom-Naphaz-Polysorb-Zn Sulf (CLEAR EYES COMPLETE OP) Apply 1-2 drops to eye daily as needed (dry eyes).   Yes [provider]  levothyroxine (SYNTHROID) 25 MCG tablet Take 25 mcg by mouth every morning. 04/07/19  Yes [provider]  metoprolol succinate (TOPROL-XL) 12.5 mg TB24 24 hr tablet Take 12.5 mg by mouth daily.   Yes [provider]  Multiple  Vitamins-Minerals (VITEYES AREDS ADVANCED PO) Take 2 capsules by mouth daily.   Yes [provider]  OMEGA-3 KRILL OIL PO Take 1 tablet by mouth every morning.   Yes [provider]  polyethylene glycol powder (GLYCOLAX/MIRALAX) 17 GM/SCOOP powder Take 17 g by mouth daily as needed for mild constipation. Takes 17grams (1 capful) as needed. 12/12/18  Yes Mariel Aloe, MD  ULORIC 40 MG tablet Take 1 tablet by mouth daily. 11/24/15  Yes [provider]  metoprolol succinate (TOPROL-XL) 25 MG 24 hr tablet Take 1 tablet (25 mg total) by mouth daily. Patient not taking: Reported on 04/25/2019 12/13/18   Mariel Aloe, MD   No Known Allergies Review of Systems Denies pain.   Physical Exam Elderly patient resting in chair Some mild abdominal tenderness Regular S1 S2 murmur No edema Non focal  Vital Signs: BP (!) 155/75 (BP Location: Left Arm)   Pulse 62   Temp 97.9 F (36.6 C)   Resp 18   Ht 5\' 7"  (1.702 m)   Wt 72.3 kg   SpO2 100%   BMI 24.96 kg/m  Pain Scale: 0-10   Pain Score: 0-No pain   SpO2: SpO2: 100 % O2 Device:SpO2: 100 % O2 Flow Rate: .O2 Flow Rate (L/min): 2 L/min  IO: Intake/output summary:   Intake/Output Summary (Last 24 hours) at 04/28/2019 1523 Last data filed at 04/28/2019 0600 Gross per 24 hour  Intake 1731.24 ml  Output 650 ml  Net 1081.24 ml    LBM: Last BM Date: 04/26/19 Baseline Weight: Weight: 68.9 kg Most recent weight: Weight: 72.3 kg     Palliative Assessment/Data:   PPS 40%  Time In:  1400 Time Out:  1500 Time Total:  60  Greater than 50%  of this time was spent counseling and coordinating care related to  the above assessment and plan.  Signed by: Loistine Chance, MD   Please contact Palliative Medicine Team phone at [phone] for questions and concerns.  For individual provider: See Shea Evans

## 2019-04-28 NOTE — Progress Notes (Signed)
Physical Therapy Treatment Patient Details Name: [name] [name] MRN: [id number] DOB: [birth year] Today's Date: 04/28/2019    History of Present Illness 86 yo male admitted with UTI, possible cholelithiasis. Hx of aortic stenosis, CHF, pacemaker, prostate, Ca, bladder Ca, macular degeneration, DVT    PT Comments    Pt continues to participate well. He reports some abdominal soreness/tightness since procedure on yesterday. Daughter was present during session again on today.    Follow Up Recommendations  Home health PT (if pt/family want);Supervision/Assistance - 24 hour     Equipment Recommendations  None recommended by PT    Recommendations for Other Services       Precautions / Restrictions Precautions Precautions: Fall Precaution Comments: 2 drains Restrictions Weight Bearing Restrictions: No    Mobility  Bed Mobility Overal bed mobility: Needs Assistance Bed Mobility: Supine to Sit     Supine to sit: Min assist;HOB elevated     General bed mobility comments: Assist for trunk and to scoot to EOB. Increased time.  Transfers Overall transfer level: Needs assistance Equipment used: Rolling walker (2 wheeled) Transfers: Sit to/from Stand Sit to Stand: Min assist         General transfer comment: Assist to rise, steady. VCs safety, hand placement.  Ambulation/Gait Ambulation/Gait assistance: Min guard Gait Distance (Feet): 125 Feet Assistive device: Rolling walker (2 wheeled) Gait Pattern/deviations: Step-through pattern;Decreased stride length     General Gait Details: Close guard for safety. Pt tolerated distance well.   Stairs             Wheelchair Mobility    Modified Rankin (Stroke Patients Only)       Balance Overall balance assessment: Needs assistance         Standing balance support: Bilateral upper extremity supported Standing balance-Leahy Scale: Poor                              Cognition Arousal/Alertness:  Awake/alert Behavior During Therapy: WFL for tasks assessed/performed Overall Cognitive Status: Within Functional Limits for tasks assessed                                        Exercises      General Comments        Pertinent Vitals/Pain Pain Assessment: No/denies pain    Home Living                      Prior Function            PT Goals (current goals can now be found in the care plan section) Progress towards PT goals: Progressing toward goals    Frequency    Min 3X/week      PT Plan Current plan remains appropriate    Co-evaluation              AM-PAC PT "6 Clicks" Mobility   Outcome Measure  Help needed turning from your back to your side while in a flat bed without using bedrails?: A Little Help needed moving from lying on your back to sitting on the side of a flat bed without using bedrails?: A Little Help needed moving to and from a bed to a chair (including a wheelchair)?: A Little Help needed standing up from a chair using your arms (e.g., wheelchair or bedside chair)?: A Little Help needed  to walk in hospital room?: A Little Help needed climbing 3-5 steps with a railing? : A Little 6 Click Score: 18    End of Session Equipment Utilized During Treatment: Gait belt Activity Tolerance: Patient tolerated treatment well Patient left: in chair;with call bell/phone within reach;with family/visitor present   PT Visit Diagnosis: Unsteadiness on feet (R26.81);Muscle weakness (generalized) (M62.81)     Time: 1353-1410 PT Time Calculation (min) (ACUTE ONLY): 17 min  Charges:  $Gait Training: 8-22 mins                        Doreatha Massed, PT Acute Rehabilitation

## 2019-04-29 ENCOUNTER — Encounter (HOSPITAL_COMMUNITY): Payer: Self-pay | Admitting: Internal Medicine

## 2019-04-29 DIAGNOSIS — K921 Melena: Secondary | ICD-10-CM

## 2019-04-29 DIAGNOSIS — R58 Hemorrhage, not elsewhere classified: Secondary | ICD-10-CM

## 2019-04-29 DIAGNOSIS — R109 Unspecified abdominal pain: Secondary | ICD-10-CM

## 2019-04-29 DIAGNOSIS — I495 Sick sinus syndrome: Secondary | ICD-10-CM

## 2019-04-29 DIAGNOSIS — N184 Chronic kidney disease, stage 4 (severe): Secondary | ICD-10-CM

## 2019-04-29 LAB — COMPREHENSIVE METABOLIC PANEL
ALT: 144 U/L — ABNORMAL HIGH (ref 0–44)
AST: 151 U/L — ABNORMAL HIGH (ref 15–41)
Albumin: 2.2 g/dL — ABNORMAL LOW (ref 3.5–5.0)
Alkaline Phosphatase: 142 U/L — ABNORMAL HIGH (ref 38–126)
Anion gap: 8 (ref 5–15)
BUN: 49 mg/dL — ABNORMAL HIGH (ref 8–23)
CO2: 19 mmol/L — ABNORMAL LOW (ref 22–32)
Calcium: 7.4 mg/dL — ABNORMAL LOW (ref 8.9–10.3)
Chloride: 112 mmol/L — ABNORMAL HIGH (ref 98–111)
Creatinine, Ser: 2.72 mg/dL — ABNORMAL HIGH (ref 0.61–1.24)
GFR calc Af Amer: 22 mL/min — ABNORMAL LOW (ref >60)
GFR calc non Af Amer: 19 mL/min — ABNORMAL LOW (ref >60)
Glucose, Bld: 90 mg/dL (ref 70–99)
Potassium: 5.5 mmol/L — ABNORMAL HIGH (ref 3.5–5.1)
Sodium: 139 mmol/L (ref 135–145)
Total Bilirubin: 2.3 mg/dL — ABNORMAL HIGH (ref 0.3–1.2)
Total Protein: 5.5 g/dL — ABNORMAL LOW (ref 6.5–8.1)

## 2019-04-29 LAB — CBC
HCT: 25.5 % — ABNORMAL LOW (ref 39.0–52.0)
HCT: 24.3 % — ABNORMAL LOW (ref 39.0–52.0)
Hemoglobin: 8.3 g/dL — ABNORMAL LOW (ref 13.0–17.0)
Hemoglobin: 7.7 g/dL — ABNORMAL LOW (ref 13.0–17.0)
MCH: 32.7 pg (ref 26.0–34.0)
MCH: 31.6 pg (ref 26.0–34.0)
MCHC: 32.5 g/dL (ref 30.0–36.0)
MCHC: 31.7 g/dL (ref 30.0–36.0)
MCV: 100.4 fL — ABNORMAL HIGH (ref 80.0–100.0)
MCV: 99.6 fL (ref 80.0–100.0)
Platelets: 183 10*3/uL (ref 150–400)
Platelets: 172 10*3/uL (ref 150–400)
RBC: 2.54 MIL/uL — ABNORMAL LOW (ref 4.22–5.81)
RBC: 2.44 MIL/uL — ABNORMAL LOW (ref 4.22–5.81)
RDW: 15.9 % — ABNORMAL HIGH (ref 11.5–15.5)
RDW: 16.1 % — ABNORMAL HIGH (ref 11.5–15.5)
WBC: 9.2 10*3/uL (ref 4.0–10.5)
WBC: 7.4 10*3/uL (ref 4.0–10.5)
nRBC: 0 % (ref 0.0–0.2)
nRBC: 0 % (ref 0.0–0.2)

## 2019-04-29 LAB — HEMOGLOBIN AND HEMATOCRIT, BLOOD
HCT: 25.1 % — ABNORMAL LOW (ref 39.0–52.0)
Hemoglobin: 7.5 g/dL — ABNORMAL LOW (ref 13.0–17.0)

## 2019-04-29 MED ORDER — SODIUM CHLORIDE 0.9 % IV SOLN: INTRAVENOUS | Status: DC

## 2019-04-29 MED ORDER — SODIUM CHLORIDE 0.9 % IV SOLN
INTRAVENOUS | Status: DC | PRN
  Administered 2019-04-29: 250 mL via INTRAVENOUS

## 2019-04-29 MED ORDER — FUROSEMIDE 10 MG/ML IJ SOLN
40.0000 mg | Freq: Once | INTRAMUSCULAR | Status: AC
  Administered 2019-04-29: 40 mg via INTRAVENOUS
  Filled 2019-04-29: qty 4

## 2019-04-29 NOTE — Progress Notes (Signed)
Pt has had 4 total bloody BM's today. BP is now 108/49 with HR 60-70's. Asymptomatic. Dr. Neysa Bonito and Dr. Michail Sermon both made aware. Will continue to monitor.

## 2019-04-29 NOTE — Progress Notes (Signed)
OT Cancellation Note  Patient Details Name: [name] [name] MRN: [id number] DOB: [birth year]   Cancelled Treatment:    Reason Eval/Treat Not Completed: Other (comment).  Pt has just had a large, bloody BM per RN. Will check back later today, if schedule permits (or tomorrow, if not)  [name] 04/29/2019, 9:48 AM  Karsten Ro, OTR/L Acute Rehabilitation Services 04/29/2019

## 2019-04-29 NOTE — Progress Notes (Addendum)
Patient ID: [name] [name], male   DOB: 05-10-1921, 86 y.o.   MRN: [id number]         [facility] Infectious Disease  Date of Admission:  04/25/2019           Day 4 piperacillin tazobactam ASSESSMENT: I suspect that his enterococcal bacteremia was due to cholangitis which is often polymicrobial in nature.  He is at risk for enterococcal bacteremia given his severe, calcific aortic stenosis and pacemaker.  His initial blood cultures grew Enterococcus casseliflavus, the species that is less likely to cause endocarditis than Enterococcus faecalis.  I am also encouraged that repeat blood cultures obtained 36 hours after admission remain negative.  Nonetheless, I suggest consulting cardiology to get their opinion about TEE, specifically what type of sedation would be required and if he is pacemaker dependent.  If he has endocarditis or we strongly suspected that would require combination antibiotic therapy with ampicillin and ceftriaxone for 6 weeks.  PLAN: 1. Continue piperacillin tazobactam for now 2. Recommend cardiology consult 3. PICC placement tomorrow if repeat blood cultures remain negative  Principal Problem:   Enterococcal bacteremia Active Problems:   Acute lower UTI   Sepsis ([place])   Choledocholithiasis   Hypertension   Pacemaker   Aortic stenosis   CKD (chronic kidney disease), stage IV (HCC)   Bladder cancer (HCC)   Acute on chronic diastolic CHF (congestive heart failure) (HCC)   Abnormal liver function   Status post ileal conduit (HCC)   Macrocytic anemia   HOH (hard of hearing)   History of prostate cancer   Scheduled Meds: . aspirin EC  81 mg Oral Daily  . cholecalciferol  1,000 Units Oral Daily  . docusate sodium  300 mg Oral Daily  . febuxostat  40 mg Oral Daily  . levothyroxine  25 mcg Oral Q0600  . metoprolol succinate  12.5 mg Oral Daily  . multivitamin  1 tablet Oral Daily  . pantoprazole (PROTONIX) IV  40 mg Intravenous QHS   Continuous  Infusions: . piperacillin-tazobactam (ZOSYN)  IV 2.25 g (04/29/19 0512)   PRN Meds:.colchicine, ondansetron (ZOFRAN) IV, polyethylene glycol, polyvinyl alcohol, traMADol   SUBJECTIVE: [name] [name] had several hours of increased mid abdominal pain last night.  He has been oozing melanotic stool this morning.  Review of Systems: Review of Systems  Constitutional: Negative for fever.  Respiratory: Negative for cough and shortness of breath.   Cardiovascular: Negative for chest pain.  Gastrointestinal: Positive for abdominal pain and blood in stool. Negative for diarrhea, nausea and vomiting.  Musculoskeletal: Negative for back pain.    No Known Allergies  OBJECTIVE: Vitals:   04/28/19 0500 04/28/19 2036 04/29/19 0612 04/29/19 0932  BP:  (!) 134/51 (!) 144/59 (!) 150/66  Pulse:  68 100 63  Resp:  18 17   Temp:  (!) 97.4 F (36.3 C) 98.1 F (36.7 C)   TempSrc:  Oral    SpO2:  98% 96% 99%  Weight: 72.3 kg     Height:       Body mass index is 24.96 kg/m.  Physical Exam Constitutional:      Comments: He is resting quietly in bed.  His daughter is present.  Cardiovascular:     Rate and Rhythm: Normal rate and regular rhythm.     Heart sounds: Murmur present.     Comments: Pacemaker site is okay. Pulmonary:     Effort: Pulmonary effort is normal.     Breath sounds: Normal breath  sounds.  Abdominal:     Palpations: Abdomen is soft.     Tenderness: There is no abdominal tenderness.  Skin:    Comments: Increased, scattered ecchymoses.     Lab Results Lab Results  Component Value Date   WBC 9.2 04/29/2019   HGB 8.3 (L) 04/29/2019   HCT 25.5 (L) 04/29/2019   MCV 100.4 (H) 04/29/2019   PLT 183 04/29/2019    Lab Results  Component Value Date   CREATININE 2.72 (H) 04/29/2019   BUN 49 (H) 04/29/2019   NA 139 04/29/2019   K 5.5 (H) 04/29/2019   CL 112 (H) 04/29/2019   CO2 19 (L) 04/29/2019    Lab Results  Component Value Date   ALT 144 (H) 04/29/2019   AST  151 (H) 04/29/2019   ALKPHOS 142 (H) 04/29/2019   BILITOT 2.3 (H) 04/29/2019     Microbiology: Recent Results (from the past 240 hour(s))  Urine Culture     Status: Abnormal   Collection Time: 04/25/19  5:47 PM   Specimen: Urine, Random  Result Value Ref Range Status   Specimen Description   Final    URINE, RANDOM Performed at [facility], [place] [address], [place], [place] [address]    Special Requests   Final    RIGHT NEPHROSTMY Performed at [place] [address], [place], [place] [address]    Culture MULTIPLE SPECIES PRESENT, SUGGEST RECOLLECTION (A)  Final   Report Status 04/26/2019 FINAL  Final  Urine culture     Status: Abnormal   Collection Time: 04/25/19  5:49 PM   Specimen: Urine, Random  Result Value Ref Range Status   Specimen Description   Final    URINE, RANDOM Performed at [place] [address], [place], [place] [address]    Special Requests   Final    NONE Performed at [facility], [place] [address] [place], [place] [address]    Culture MULTIPLE SPECIES PRESENT, SUGGEST RECOLLECTION (A)  Final   Report Status 04/26/2019 FINAL  Final  Respiratory Panel by RT PCR (Flu A&B, Covid) - Nasopharyngeal Swab     Status: None   Collection Time: 04/25/19  6:53 PM   Specimen: Nasopharyngeal Swab  Result Value Ref Range Status   SARS Coronavirus 2 by RT PCR NEGATIVE NEGATIVE Final    Comment: (NOTE) SARS-CoV-2 target nucleic acids are NOT DETECTED. The SARS-CoV-2 RNA is generally detectable in upper respiratoy specimens during the acute phase of infection. The lowest concentration of SARS-CoV-2 viral copies this assay can detect is 131 copies/mL. A negative result does not preclude SARS-Cov-2 infection and should not be used as the sole basis for treatment or other patient management decisions. A negative result may occur with  improper specimen collection/handling, submission of  specimen other than nasopharyngeal swab, presence of viral mutation(s) within the areas targeted by this assay, and inadequate number of viral copies (<131 copies/mL). A negative result must be combined with clinical observations, patient history, and epidemiological information. The expected result is Negative. Fact Sheet for Patients:  PinkCheek.be Fact Sheet for Healthcare Providers:  GravelBags.it This test is not yet ap proved or cleared by the [place] FDA and  has been authorized for detection and/or diagnosis of SARS-CoV-2 by FDA under an Emergency Use Authorization (EUA). This EUA will remain  in effect (meaning this test can be used) for the duration of the COVID-19 declaration under Section 564(b)(1) of the Act, 21  U.S.C. section 360bbb-3(b)(1), unless the authorization is terminated or revoked sooner.    Influenza A by PCR NEGATIVE NEGATIVE Final   Influenza B by PCR NEGATIVE NEGATIVE Final    Comment: (NOTE) The Xpert Xpress SARS-CoV-2/FLU/RSV assay is intended as an aid in  the diagnosis of influenza from Nasopharyngeal swab specimens and  should not be used as a sole basis for treatment. Nasal washings and  aspirates are unacceptable for Xpert Xpress SARS-CoV-2/FLU/RSV  testing. Fact Sheet for Patients: PinkCheek.be Fact Sheet for Healthcare Providers: GravelBags.it This test is not yet approved or cleared by the [place] FDA and  has been authorized for detection and/or diagnosis of SARS-CoV-2 by  FDA under an Emergency Use Authorization (EUA). This EUA will remain  in effect (meaning this test can be used) for the duration of the  Covid-19 declaration under Section 564(b)(1) of the Act, 21  U.S.C. section 360bbb-3(b)(1), unless the authorization is  terminated or revoked. Performed at [facility], [place] [address] [place], [place] [address]   Blood culture (routine x 2)     Status: Abnormal   Collection Time: 04/25/19  8:29 PM   Specimen: BLOOD  Result Value Ref Range Status   Specimen Description BLOOD LEFT ANTECUBITAL  Final   Special Requests   Final    BOTTLES DRAWN AEROBIC AND ANAEROBIC Blood Culture adequate volume Performed at [facility] [address], [place], [place] [address]    Culture  Setup Time   Final    GRAM POSITIVE COCCI IN BOTH AEROBIC AND ANAEROBIC BOTTLES CRITICAL RESULT CALLED TO, READ BACK BY AND VERIFIED WITH: JUSTIN LEGGE @1455  04/26/19 AKT    Culture (A)  Final    ENTEROCOCCUS CASSELIFLAVUS SUSCEPTIBILITIES PERFORMED ON PREVIOUS CULTURE WITHIN THE LAST 5 DAYS. Performed at [facility], [place] [address] [place], [place] [address]    Report Status 04/28/2019 FINAL  Final  Blood culture (routine x 2)     Status: Abnormal   Collection Time: 04/25/19  8:33 PM   Specimen: BLOOD RIGHT FOREARM  Result Value Ref Range Status   Specimen Description BLOOD RIGHT FOREARM  Final   Special Requests   Final    BOTTLES DRAWN AEROBIC AND ANAEROBIC Blood Culture adequate volume Performed at [place] [address], [place], [place] [address]    Culture  Setup Time   Final    GRAM POSITIVE COCCI IN BOTH AEROBIC AND ANAEROBIC BOTTLES CRITICAL VALUE NOTED.  VALUE IS CONSISTENT WITH PREVIOUSLY REPORTED AND CALLED VALUE. AKT Organism ID to follow CRITICAL RESULT CALLED TO, READ BACK BY AND VERIFIED WITH: Christean Grief [facility] 04/26/19 1750 JDW Performed at [facility], [place] [address], [place], [place] [address]    Culture ENTEROCOCCUS CASSELIFLAVUS (A)  Final   Report Status 04/28/2019 FINAL  Final   Organism ID, Bacteria ENTEROCOCCUS CASSELIFLAVUS  Final      Susceptibility   Enterococcus casseliflavus - MIC*    AMPICILLIN <=2 SENSITIVE Sensitive     VANCOMYCIN RESISTANT Resistant     GENTAMICIN SYNERGY SENSITIVE Sensitive      LINEZOLID 2 SENSITIVE Sensitive     * ENTEROCOCCUS CASSELIFLAVUS  Blood Culture ID Panel (Reflexed)     Status: Abnormal   Collection Time: 04/25/19  8:33 PM  Result Value Ref Range Status   Enterococcus species DETECTED (A) NOT DETECTED Final    Comment: CRITICAL RESULT CALLED TO, READ BACK BY AND VERIFIED WITH: J LEGGE  PHARMD 04/26/19 1750 JDW    Vancomycin resistance NOT DETECTED NOT DETECTED Final   Listeria monocytogenes NOT DETECTED NOT DETECTED Final   Staphylococcus species NOT DETECTED NOT DETECTED Final   Staphylococcus aureus (BCID) NOT DETECTED NOT DETECTED Final   Streptococcus species NOT DETECTED NOT DETECTED Final   Streptococcus agalactiae NOT DETECTED NOT DETECTED Final   Streptococcus pneumoniae NOT DETECTED NOT DETECTED Final   Streptococcus pyogenes NOT DETECTED NOT DETECTED Final   Acinetobacter baumannii NOT DETECTED NOT DETECTED Final   Enterobacteriaceae species NOT DETECTED NOT DETECTED Final   Enterobacter cloacae complex NOT DETECTED NOT DETECTED Final   Escherichia coli NOT DETECTED NOT DETECTED Final   Klebsiella oxytoca NOT DETECTED NOT DETECTED Final   Klebsiella pneumoniae NOT DETECTED NOT DETECTED Final   Proteus species NOT DETECTED NOT DETECTED Final   Serratia marcescens NOT DETECTED NOT DETECTED Final   Haemophilus influenzae NOT DETECTED NOT DETECTED Final   Neisseria meningitidis NOT DETECTED NOT DETECTED Final   Pseudomonas aeruginosa NOT DETECTED NOT DETECTED Final   Candida albicans NOT DETECTED NOT DETECTED Final   Candida glabrata NOT DETECTED NOT DETECTED Final   Candida krusei NOT DETECTED NOT DETECTED Final   Candida parapsilosis NOT DETECTED NOT DETECTED Final   Candida tropicalis NOT DETECTED NOT DETECTED Final    Comment: Performed at [facility], [place] [address], [place], [place] [address]  Culture, blood (routine x 2)     Status: None (Preliminary result)   Collection Time: 04/27/19 10:14 AM   Specimen: BLOOD LEFT HAND   Result Value Ref Range Status   Specimen Description   Final    BLOOD LEFT HAND Performed at [facility] [address] [place], [place] [address]    Special Requests   Final    BOTTLES DRAWN AEROBIC ONLY Blood Culture adequate volume Performed at [facility] [address] [place], [place] [address]    Culture   Final    NO GROWTH 2 DAYS Performed at [facility] [address] [place], [place] [address]    Report Status PENDING  Incomplete  Culture, blood (routine x 2)     Status: None (Preliminary result)   Collection Time: 04/27/19 10:14 AM   Specimen: BLOOD  Result Value Ref Range Status   Specimen Description   Final    BLOOD LEFT ARM Performed at [facility] [address], [place], [place] [address]    Special Requests   Final    BOTTLES DRAWN AEROBIC ONLY Blood Culture adequate volume Performed at [facility] [address], [place], [place] [address]    Culture   Final    NO GROWTH 2 DAYS Performed at [facility] [place] [address] [place], [place] [address]    Report Status PENDING  Incomplete    Michel Bickers, MD [facility] [place] [phone] pager   [phone] cell 04/29/2019, 11:19 AM

## 2019-04-29 NOTE — Plan of Care (Signed)
  Problem: Clinical Measurements: Goal: Will remain free from infection Outcome: Progressing Goal: Respiratory complications will improve Outcome: Completed/Met Goal: Cardiovascular complication will be avoided Outcome: Completed/Met   Problem: Activity: Goal: Risk for activity intolerance will decrease Outcome: Progressing   Problem: Nutrition: Goal: Adequate nutrition will be maintained Outcome: Progressing   Problem: Coping: Goal: Level of anxiety will decrease Outcome: Progressing   Problem: Elimination: Goal: Will not experience complications related to bowel motility Outcome: Not Progressing Goal: Will not experience complications related to urinary retention Outcome: Completed/Met   Problem: Pain Managment: Goal: General experience of comfort will improve Outcome: Progressing   Problem: Safety: Goal: Ability to remain free from injury will improve Outcome: Progressing   Problem: Skin Integrity: Goal: Risk for impaired skin integrity will decrease Outcome: Progressing   Problem: Fluid Volume: Goal: Hemodynamic stability will improve Outcome: Progressing   Problem: Clinical Measurements: Goal: Diagnostic test results will improve Outcome: Progressing Goal: Signs and symptoms of infection will decrease Outcome: Progressing   Problem: Respiratory: Goal: Ability to maintain adequate ventilation will improve Outcome: Completed/Met

## 2019-04-29 NOTE — Consult Note (Addendum)
Cardiology Consultation:   Patient ID: [name] [name] MRN: [id number]; DOB: 01-22-1922  Admit date: 04/25/2019 Date of Consult: 04/29/2019  Primary Care Provider: Crist Infante, [name] Primary Cardiologist: [name] Chandler, [name]  Primary Electrophysiologist:  [name] Peru, [name]    Patient Profile:   [name] [name] is a 86 y.o. male with a hx of combined systolic and diastolic HF last EF 58-52%, HTN, DVT, symptomatic bradycardia with PPM, moderate AS, bladder cancer, prostate cancer, drain in Rt kidney, legally blind and admitted 04/25/19 with Lt flank pain and sepsis who is being seen today for the evaluation of worsening AS and possible vegentation at the request of [name] [name] and [name] [name].  History of Present Illness:   [name] [name] with hx of low flow, low gradient severe AS and had been doing well on Lasix.  In December 2020 when he saw [name] [name] he felt well.  He is not felt to be a good candidate for TAVR or surgical AVR with advanced age and indwelling drain in Rt kidney.  (s/p cystectomy/ ileal conduit , right nephrostomy)   Pt presented 04/25/19 with lt flank pain, he had experienced falls as well.  Ct of abd without Lt hydronephrosis, 1.4 cm non obstructing stone in lower pole Lt kidney , cholelithiasis, not acute, moderate to large Rt sided pleural effusion small left sided pl effusion, Rt sided nephrotomy tube,  Findings concerning for 1.9 cm mass in the right middle lobe. Follow-up with a nonemergent contrast enhanced CT of the chest is recommended for further evaluation of this finding also pericardial effusion.   +entercoccal bacteremia ID following repeat blood cultures neg at 24 hours.  Pt high risk for endocarditis ID wanted help to determine if we should proceed with TEE to help determine optimal management strategy.   GI with ERCP 04/27/19 non bleeding duodenal ulcers, Choledocholithiasis was found. Complete removal was accomplished by biliary sphincterotomy and  balloon extraction -Pyloric channel balloon dilation was necessary prior to passage of the duodenoscope for the ERCP   Echo 04/28/19 with EF 25-30%, moderately increased LVH  RV with mildly reduced systolic function moderately elevated PA systolic pressure at 47 mmHg, LA moderately dilated.   Aortic valve regurgitation is mild to  moderate. Severe, low flow low gradient aortic valve stenosis. Systolic mean gradient is approximately 38 mmHg. Dimensionless index 0.21. There is  severe calcifcation of the aortic  valve. Severely restricted leaflet motion.   Palliative care has been consulted as well per family request.  Pt lives alone but daughter lives beside him.     EKG:  The EKG was personally reviewed and demonstrates:  No recent EKG  Telemetry:  Telemetry was personally reviewed and demonstrates:  A pacing V sensing  [name] [name] implanted 2009  Labs today Na 139, K+ 5.5, BUN 49, Cr 2.72  (prior cr 2.53 -2.98-2.19)  AST 151, ALT 144, tot bili 2.3  Troponin 49, 62, 59, 58  bnp 2136 ,  Today his daughter is with him in room.  He denies chest pain or SOB.  They tell me he is having bloody stools. He has had 4 this AM.  Hgb did drop from 9.8 to 8.2 and is now 7.2. at 11:35.  At home no syncope but episodes of dizziness.     Heart Pathway Score:     Past Medical History:  Diagnosis Date  . Aortic valve stenosis    a. mild to moderate by echo 2013  . Bradycardia    [name]  dual chamber pacemaker  . Cancer [facility])    bladder cancer  . Chronic kidney disease   . Deep venous thrombosis (HCC)    hx of  LEFT ARM AFTER PACEMAKER INSERTION ABOUT 6 YRS AGO  . Depression   . Gout   . Hypertension   . Kidney stone   . Macular degeneration    PT STATES HIS VISION STILL OKAY FOR DRIVING  . Osteoporosis   . Prostate cancer ([place])    AND BLADDER CANCER - S/P URETEROILEAL CONDUIT  . Renal disorder   . S/P ileal conduit [facility])         Past Surgical History:  Procedure Laterality Date  . 07/20/13   CONVERSION OF RIGHT SIDED NEPHROSTOMY CATHETER TO RIGHT SIDED NEPHRO URETERAL CATHETER - DONE IN INTERVENTIONAL RADIOLOGY    . APPENDECTOMY    . BALLOON DILATION N/A 04/27/2019   Procedure: BALLOON DILATION;  Surgeon: [name] Essex, [name];  Location: [facility];  Service: Endoscopy;  Laterality: N/A;  pyloric   . CATARACT EXTRACTION, BILATERAL    . CYSTECTOMY    . ERCP N/A 04/27/2019   Procedure: ENDOSCOPIC RETROGRADE CHOLANGIOPANCREATOGRAPHY (ERCP);  Surgeon: [name] Essex, [name];  Location: [facility];  Service: Endoscopy;  Laterality: N/A;  . ESOPHAGOGASTRODUODENOSCOPY N/A 04/27/2019   Procedure: ESOPHAGOGASTRODUODENOSCOPY (EGD);  Surgeon: [name] Essex, [name];  Location: [facility];  Service: Endoscopy;  Laterality: N/A;  . ilial conduit     for bladder cancer  . IR GENERIC HISTORICAL  10/26/2015   IR NEPHROSTOMY TUBE CHANGE 10/26/2015 [name] [name] [facility]  . IR GENERIC HISTORICAL  12/07/2015   IR NEPHROSTOMY TUBE CHANGE 12/07/2015 [name] Keen, [name] [facility]  . IR GENERIC HISTORICAL  01/11/2016   IR NEPHROSTOMY TUBE CHANGE 01/11/2016 [name] Keen, [name] [facility]  . IR GENERIC HISTORICAL  02/08/2016   IR NEPHROSTOMY TUBE CHANGE 02/08/2016 [facility]  . IR GENERIC HISTORICAL  03/14/2016   IR NEPHROSTOMY TUBE CHANGE 03/14/2016 [name] [name] [facility]  . IR GENERIC HISTORICAL  04/22/2016   IR NEPHROSTOMY TUBE CHANGE 04/22/2016 [name] [name] [facility]  . IR GENERIC HISTORICAL  05/27/2016   IR NEPHROSTOMY TUBE CHANGE 05/27/2016 [name] [name] [facility]  . IR NEPHROSTOMY EXCHANGE RIGHT  07/25/2017  . IR NEPHROSTOMY EXCHANGE RIGHT  10/13/2017  . IR NEPHROSTOMY EXCHANGE RIGHT  11/18/2017  . IR NEPHROSTOMY EXCHANGE RIGHT  12/30/2017  . IR NEPHROSTOMY EXCHANGE RIGHT  02/10/2018  . IR NEPHROSTOMY EXCHANGE RIGHT  03/23/2018  . IR NEPHROSTOMY EXCHANGE RIGHT  05/04/2018  . IR NEPHROSTOMY EXCHANGE RIGHT  06/25/2018  . IR NEPHROSTOMY EXCHANGE RIGHT  08/13/2018  . IR  NEPHROSTOMY EXCHANGE RIGHT  10/01/2018  . IR NEPHROSTOMY EXCHANGE RIGHT  11/19/2018  . IR NEPHROSTOMY EXCHANGE RIGHT  01/05/2019  . IR NEPHROSTOMY EXCHANGE RIGHT  04/01/2019  . IR NEPHROSTOMY PLACEMENT RIGHT  06/13/2017  . IR NEPHROSTOMY PLACEMENT RIGHT  02/23/2019  . IR NEPHROSTOMY TUBE CHANGE  07/01/2016  . IR NEPHROSTOMY TUBE CHANGE  08/05/2016  . IR NEPHROSTOMY TUBE CHANGE  09/18/2016  . IR NEPHROSTOMY TUBE CHANGE  10/24/2016  . IR NEPHROSTOMY TUBE CHANGE  11/18/2016  . IR NEPHROSTOMY TUBE CHANGE  12/30/2016  . IR NEPHROSTOMY TUBE CHANGE  02/03/2017  . IR NEPHROSTOMY TUBE CHANGE  03/13/2017  . IR NEPHROSTOMY TUBE CHANGE  04/17/2017  . IR NEPHROSTOMY TUBE CHANGE  05/28/2017  . IR NEPHROSTOMY TUBE CHANGE  09/04/2017  . KIDNEY STONE SURGERY    . LYMPHADENECTOMY    .  NEPHROLITHOTOMY  09/02/2011   Procedure: NEPHROLITHOTOMY PERCUTANEOUS;  Surgeon: Malka So, [name];  [facility];  Service: Urology;  Laterality: Left;  . NEPHROLITHOTOMY Right 07/27/2013   Procedure: RIGHT PERCUTANEOUS NEPHROLITHOTOMY ;  Surgeon: Irine Seal, [name];  Location: [facility];  Service: Urology;  Laterality: Right;  . PACEMAKER INSERTION  2009   [name] dual chamber pacemaker implanted by Dr Verlon Setting  . PROSTATE SURGERY    . REMOVAL OF STONES  04/27/2019   Procedure: REMOVAL OF STONES;  Surgeon: [name] Essex, [name];  Location: [facility];  Service: Endoscopy;;  . Joan Mayans  04/27/2019   Procedure: Joan Mayans;  Surgeon: [name] Essex, [name];  Location: [facility];  Service: Endoscopy;;     Home Medications:  Prior to Admission medications   Medication Sig Start Date End Date Taking? Authorizing Provider  acetaminophen (TYLENOL) 500 MG tablet Take 1,000 mg by mouth every 8 (eight) hours as needed for mild pain or headache.   Yes Provider, Historical, [name]  aspirin EC 81 MG tablet Take 81 mg by mouth every morning.   Yes Provider, Historical, [name]  Besifloxacin HCl (BESIVANCE) 0.6 % SUSP Apply 1 drop to eye See admin instructions. Only uses  before eye shot he receives every 10 weeks.   Yes Provider, Historical, [name]  Cholecalciferol (VITAMIN D-3) 1000 UNITS CAPS Take 1 capsule by mouth every morning.   Yes Provider, Historical, [name]  colchicine 0.6 MG tablet Take 0.6 mg by mouth as needed (gout).    Yes Provider, Historical, [name]  docusate sodium (COLACE) 100 MG capsule Take 300 mg by mouth daily.    Yes Provider, Historical, [name]  furosemide (LASIX) 20 MG tablet Take 1 tablet (20 mg total) by mouth daily. 12/16/18 12/11/19 Yes Burnell Blanks, [name]  Hyprom-Naphaz-Polysorb-Zn Sulf (CLEAR EYES COMPLETE OP) Apply 1-2 drops to eye daily as needed (dry eyes).   Yes Provider, Historical, [name]  levothyroxine (SYNTHROID) 25 MCG tablet Take 25 mcg by mouth every morning. 04/07/19  Yes Provider, Historical, [name]  metoprolol succinate (TOPROL-XL) 12.5 mg TB24 24 hr tablet Take 12.5 mg by mouth daily.   Yes Provider, Historical, [name]  Multiple Vitamins-Minerals (VITEYES AREDS ADVANCED PO) Take 2 capsules by mouth daily.   Yes Provider, Historical, [name]  OMEGA-3 KRILL OIL PO Take 1 tablet by mouth every morning.   Yes Provider, Historical, [name]  polyethylene glycol powder (GLYCOLAX/MIRALAX) 17 GM/SCOOP powder Take 17 g by mouth daily as needed for mild constipation. Takes 17grams (1 capful) as needed. 12/12/18  Yes Mariel Aloe, [name]  ULORIC 40 MG tablet Take 1 tablet by mouth daily. 11/24/15  Yes Provider, Historical, [name]  metoprolol succinate (TOPROL-XL) 25 MG 24 hr tablet Take 1 tablet (25 mg total) by mouth daily. Patient not taking: Reported on 04/25/2019 12/13/18   Mariel Aloe, [name]    Inpatient Medications: Scheduled Meds: . aspirin EC  81 mg Oral Daily  . cholecalciferol  1,000 Units Oral Daily  . docusate sodium  300 mg Oral Daily  . febuxostat  40 mg Oral Daily  . levothyroxine  25 mcg Oral Q0600  . metoprolol succinate  12.5 mg Oral Daily  . multivitamin  1 tablet Oral Daily  . pantoprazole (PROTONIX) IV  40 mg Intravenous QHS   Continuous  Infusions: . piperacillin-tazobactam (ZOSYN)  IV 2.25 g (04/29/19 0512)   PRN Meds: colchicine, ondansetron (ZOFRAN) IV, polyethylene glycol, polyvinyl alcohol, traMADol  Allergies:   No Known Allergies  Social History:   Social History  Socioeconomic History  . Marital status: Widowed    Spouse name: Not on file  . Number of children: 3  . Years of education: Not on file  . Highest education level: Not on file  Occupational History  . Occupation: IT sales professional: RETIRED  Tobacco Use  . Smoking status: Former Smoker    Types: Cigarettes    Quit date: 03/25/1952    Years since quitting: 67.1  . Smokeless tobacco: Never Used  Substance and Sexual Activity  . Alcohol use: No  . Drug use: No  . Sexual activity: Never  Other Topics Concern  . Not on file  Social History Narrative   ** Merged History Encounter **       Social Determinants of Health   Financial Resource Strain:   . Difficulty of Paying Living Expenses: Not on file  Food Insecurity:   . Worried About Charity fundraiser in the Last Year: Not on file  . Ran Out of Food in the Last Year: Not on file  Transportation Needs:   . Lack of Transportation (Medical): Not on file  . Lack of Transportation (Non-Medical): Not on file  Physical Activity:   . Days of Exercise per Week: Not on file  . Minutes of Exercise per Session: Not on file  Stress:   . Feeling of Stress : Not on file  Social Connections:   . Frequency of Communication with Friends and Family: Not on file  . Frequency of Social Gatherings with Friends and Family: Not on file  . Attends Religious Services: Not on file  . Active Member of Clubs or Organizations: Not on file  . Attends Archivist Meetings: Not on file  . Marital Status: Not on file  Intimate Partner Violence:   . Fear of Current or Ex-Partner: Not on file  . Emotionally Abused: Not on file  . Physically Abused: Not on file  . Sexually Abused: Not on file     Family History:    Family History  Problem Relation Age of Onset  . Pancreatic cancer Father 62       died  . Pancreatic cancer Mother 66       died     ROS:  Please see the history of present illness.  General:no colds or fevers, no weight changes Skin:no rashes or ulcers HEENT:no blurred vision, no congestion CV:see HPI PUL:see HPI GI:no diarrhea constipation + melena today , no indigestion, + stone obstruction. GU:no hematuria, no dysuria- with drain,  MS:no joint pain, no claudication Neuro:no syncope, + lightheadedness Endo:no diabetes, no thyroid disease  All other ROS reviewed and negative.     Physical Exam/Data:   Vitals:   04/28/19 0500 04/28/19 2036 04/29/19 0612 04/29/19 0932  BP:  (!) 134/51 (!) 144/59 (!) 150/66  Pulse:  68 100 63  Resp:  18 17   Temp:  (!) 97.4 F (36.3 C) 98.1 F (36.7 C)   TempSrc:  Oral    SpO2:  98% 96% 99%  Weight: 72.3 kg     Height:        Intake/Output Summary (Last 24 hours) at 04/29/2019 1126 Last data filed at 04/28/2019 1800 Gross per 24 hour  Intake --  Output 350 ml  Net -350 ml   Last 3 Weights 04/28/2019 04/27/2019 04/26/2019  Weight (lbs) 159 lb 6.3 oz 154 lb 12.2 oz 154 lb 12.8 oz  Weight (kg) 72.3 kg 70.2 kg 70.217 kg  Body mass index is 24.96 kg/m.  General:  Well nourished, well developed, in no acute distress sitting up in bed.-  HEENT: normal though hard of hearing Lymph: no adenopathy Neck: no JVD sitting up Endocrine:  No thryomegaly Vascular: No carotid bruits; pedal pulses 1+ bilaterally   Cardiac:  normal S1, S2; RRR; 3/6 systolic murmur no gallup or rub Lungs:  clear to auscultation bilaterally though diminished in basses, no wheezing, rhonchi or rales  Abd: soft, nontender, no hepatomegaly , urostomy in place Ext: no edema of lower ext but + upper extremities.  Musculoskeletal:  No deformities, BUE and BLE strength normal and equal Skin: warm and dry  Neuro:  Alert and oriented X 3, MAE, follows  commands no focal abnormalities noted Psych:  Normal affect    Relevant CV Studies: ECHO 04/28/19  IMPRESSIONS    1. Left ventricular ejection fraction, by visual estimation, is 25 to  30%. The left ventricle has severely decreased function. There is  moderately increased left ventricular hypertrophy.  2. Elevated left ventricular end-diastolic pressure.  3. Global right ventricle has mildly reduced systolic function.The right  ventricular size is normal. No increase in right ventricular wall  thickness.  4. Moderately elevated pulmonary artery systolic pressure, RVSP 47 mmHg.  5. Left atrial size was moderately dilated.  6. The aortic valve is abnormal. Aortic valve regurgitation is mild to  moderate. Severe, low flow low gradient aortic valve stenosis. Systolic  mean gradient is approximately 38 mmHg. Dimensionless index 0.21. There is  severe calcifcation of the aortic  valve. Severely restricted leaflet motion.  7. Mild mitral annular calcification.  8. The mitral valve is abnormal. Mild to moderate mitral valve  regurgitation. No evidence of mitral stenosis.  9. The tricuspid valve is normal in structure. Tricuspid valve  regurgitation is moderate.  10. There is mild dilatation of the ascending aorta measuring 40 mm.  11. The inferior vena cava is normal in size with <50% respiratory  variability, suggesting right atrial pressure of 8 mmHg.  12. Trivial pericardial effusion is present. Moderate pleural effusion.  13. The left ventricle demonstrates global hypokinesis.  14. Side by side comparison of images performed to study from 12/10/2018.  LV function has not significantly changed. Aortic stenosis is now severe.  AR, MR, and TR appear to have increased.   FINDINGS  Left Ventricle: Left ventricular ejection fraction, by visual estimation,  is 25 to 30%. The left ventricle has severely decreased function. The left  ventricle demonstrates global hypokinesis. There  is moderately increased  left ventricular hypertrophy.  Left ventricular diastolic parameters are indeterminate. Elevated left  ventricular end-diastolic pressure.   Right Ventricle: The right ventricular size is normal. No increase in  right ventricular wall thickness. Global RV systolic function is has  mildly reduced systolic function. The tricuspid regurgitant velocity is  3.11 m/s, and with an assumed right atrial  pressure of 8 mmHg, the estimated right ventricular systolic pressure is  moderately elevated at 46.6 mmHg.   Left Atrium: Left atrial size was moderately dilated.   Right Atrium: Right atrial size was normal in size   Pericardium: Trivial pericardial effusion is present. There is a moderate  pleural effusion in either the left or right lateral region.   Mitral Valve: The mitral valve is abnormal. There is mild thickening of  the mitral valve leaflet(s). Mild mitral annular calcification. Mild to  moderate mitral valve regurgitation. No evidence of mitral valve stenosis  by observation.  Tricuspid Valve: The tricuspid valve is normal in structure. Tricuspid  valve regurgitation is moderate.   Aortic Valve: The aortic valve is abnormal. Aortic valve regurgitation is  mild to moderate. Aortic regurgitation PHT measures 608 msec. Severe  aortic stenosis is present. There is severe calcifcation of the aortic  valve. Aortic valve mean gradient  measures 37.5 mmHg. Aortic valve peak gradient measures 62.3 mmHg. Aortic  valve area, by VTI measures 0.61 cm.   Pulmonic Valve: The pulmonic valve was normal in structure. Pulmonic valve  regurgitation is trivial. Pulmonic regurgitation is trivial.   Aorta: Aortic dilatation noted. There is mild dilatation of the ascending  aorta measuring 40 mm.   Venous: The inferior vena cava is normal in size with less than 50%  respiratory variability, suggesting right atrial pressure of 8 mmHg.   IAS/Shunts: The interatrial  septum was not well visualized.   Additional Comments: A [name] wire is visualized in the right atrium and  right ventricle.    LEFT VENTRICLE  PLAX 2D  LVIDd:     4.71 cm    Diastology  LVIDs:     4.14 cm    LV e' medial:  2.81 cm/s  LV PW:     1.49 cm    LV E/e' medial: 38.0  LV IVS:    1.38 cm  LVOT diam:   2.10 cm  LV SV:     27 ml  LV SV Index:  14.49  LVOT Area:   3.46 cm    LV Volumes (MOD)  LV area d, A2C:  37.10 cm  LV area d, A4C:  38.40 cm  LV area s, A2C:  30.60 cm  LV area s, A4C:  33.80 cm  LV major d, A2C:  9.25 cm  LV major d, A4C:  8.65 cm  LV major s, A2C:  8.72 cm  LV major s, A4C:  8.42 cm  LV vol d, MOD A2C: 122.0 ml  LV vol d, MOD A4C: 138.0 ml  LV vol s, MOD A2C: 87.7 ml  LV vol s, MOD A4C: 109.0 ml  LV SV MOD A2C:   34.3 ml  LV SV MOD A4C:   138.0 ml  LV SV MOD BP:   34.3 ml   RIGHT VENTRICLE  TAPSE (M-mode): 2.1 cm   LEFT ATRIUM       Index    RIGHT ATRIUM      Index  LA diam:    3.70 cm 2.01 cm/m RA Area:   17.60 cm  LA Vol (A2C):  86.7 ml 47.21 ml/m RA Volume:  48.00 ml 26.14 ml/m  LA Vol (A4C):  71.4 ml 38.88 ml/m  LA Biplane Vol: 85.6 ml 46.62 ml/m  AORTIC VALVE          PULMONIC VALVE  AV Area (Vmax):  0.62 cm   PR End Diast Vel: 1.80 msec  AV Area (Vmean):  0.55 cm  AV Area (VTI):   0.61 cm  AV Vmax:      394.60 cm/s  AV Vmean:     283.400 cm/s  AV VTI:      1.084 m  AV Peak Grad:   62.3 mmHg  AV Mean Grad:   37.5 mmHg  LVOT Vmax:     70.70 cm/s  LVOT Vmean:    45.400 cm/s  LVOT VTI:     0.191 m  LVOT/AV VTI ratio: 0.18  AI PHT:      608 msec  AORTA  Ao Root diam: 3.00 cm  Ao Asc diam: 4.00 cm   MITRAL VALVE             TRICUSPID VALVE  MV Area (PHT): 2.87 cm       TR Peak grad:  38.6 mmHg  MV PHT:    76.56 msec      TR Vmax:    326.00  cm/s  MV Decel Time: 264 msec  MR Peak grad:  132.7 mmHg     SHUNTS  MR Mean grad:  78.0 mmHg      Systemic VTI: 0.19 m  MR Vmax:     576.00 cm/s     Systemic Diam: 2.10 cm  MR Vmean:    405.0 cm/s  MR PISA:     1.01 cm  MR PISA Eff ROA: 6 mm  MR PISA Radius: 0.40 cm  MV E velocity: 106.67 cm/s 103 cm/s  MV A velocity: 100.65 cm/s 70.3 cm/s  MV E/A ratio: 1.06    1.5   ECHO 12/10/18 IMPRESSIONS    1. Left ventricular ejection fraction, by visual estimation, is 20 to  25%. The left ventricle has severely decreased function. Mildly increased  left ventricular size. There is no left ventricular hypertrophy.  2. Multiple segmental abnormalities exist. See findings.  3. Left ventricular diastolic Doppler parameters are consistent with  impaired relaxation pattern of LV diastolic filling.  4. Global right ventricle has normal systolic function.The right  ventricular size is normal. No increase in right ventricular wall  thickness.  5. Left atrial size was severely dilated.  6. Right atrial size was normal.  7. Large pleural effusion in the left lateral region.  8. Small pericardial effusion.  9. The mitral valve is normal in structure. Trace mitral valve  regurgitation. No evidence of mitral stenosis.  10. The tricuspid valve is normal in structure. Tricuspid valve  regurgitation is mild.  11. The aortic valve is normal in structure. Aortic valve regurgitation is  mild by color flow Doppler. Moderate aortic valve stenosis.  12. The pulmonic valve was normal in structure. Pulmonic valve  regurgitation is trivial by color flow Doppler.  13. Mildly elevated pulmonary artery systolic pressure.  14. A [name] wire is visualized.  15. The inferior vena cava is normal in size with greater than 50%  respiratory variability, suggesting right atrial pressure of 3 mmHg.   FINDINGS  Left Ventricle: Left ventricular ejection fraction, by visual  estimation,  is 20 to 25%. The left ventricle has severely decreased function. There is  no left ventricular hypertrophy. Mildly increased left ventricular size.  Spectral Doppler shows Left  ventricular diastolic Doppler parameters are consistent with impaired  relaxation pattern of LV diastolic filling. Global hypokinesis.      Laboratory Data:  High Sensitivity Troponin:   Recent Labs  Lab 04/25/19 2225 04/26/19 0434 04/26/19 0914 04/26/19 1248  TROPONINIHS 49* 62* 59* 58*     Chemistry Recent Labs  Lab 04/27/19 0419 04/28/19 0449 04/29/19 0521  NA 138 137 139  K 4.6 5.5* 5.5*  CL 107 109 112*  CO2 21* 20* 19*  GLUCOSE 92 168* 90  BUN 43* 43* 49*  CREATININE 2.25* 2.42* 2.72*  CALCIUM 7.7* 7.5* 7.4*  GFRNONAA 24* 22* 19*  GFRAA 28* 25* 22*  ANIONGAP 10 8 8     Recent Labs  Lab 04/27/19 0419 04/28/19 0449 04/29/19 0521  PROT 5.9* 6.3* 5.5*  ALBUMIN 2.6* 2.5* 2.2*  AST 231* 142* 151*  ALT 196* 162* 144*  ALKPHOS 137* 152* 142*  BILITOT 3.3* 3.0* 2.3*   Hematology Recent Labs  Lab 04/27/19 0419 04/28/19 0449 04/29/19 0521  WBC 10.5 7.1 9.2  RBC 2.99* 3.05* 2.54*  HGB 9.6* 9.8* 8.3*  HCT 30.4* 32.1* 25.5*  MCV 101.7* 105.2* 100.4*  MCH 32.1 32.1 32.7  MCHC 31.6 30.5 32.5  RDW 16.0* 15.9* 15.9*  PLT 157 178 183   BNP Recent Labs  Lab 04/25/19 2033  BNP 2,136.1*    DDimer No results for input(s): DDIMER in the last 168 hours.   Radiology/Studies:  DG Chest 2 View  Result Date: 04/27/2019 CLINICAL DATA:  Pleural effusion, prior abnormal chest x-ray EXAM: CHEST - 2 VIEW COMPARISON:  04/25/2019, 04/26/2019 FINDINGS: Frontal and lateral views of the chest demonstrate small right pleural effusion with blunting of the posterior right costophrenic angle. The rounded area of consolidation within the lateral segment right middle lobe seen on prior CT is again identified and unchanged. No pneumothorax. Cardiac silhouette is enlarged but stable. Dual  lead pacemaker unchanged. IMPRESSION: 1. Stable small right pleural effusion. 2. Stable rounded consolidation right middle lobe. Please see prior CT chest discussion. Electronically Signed   By: Randa Ngo M.D.   On: 04/27/2019 11:24   CT CHEST WO CONTRAST  Result Date: 04/26/2019 CLINICAL DATA:  Lung nodule on chest x-ray EXAM: CT CHEST WITHOUT CONTRAST TECHNIQUE: Multidetector CT imaging of the chest was performed following the standard protocol without IV contrast. COMPARISON:  Abdominal CT from yesterday FINDINGS: Cardiovascular: No cardiomegaly. Dual-chamber [name] leads from the left. Low-density anterior and inferior pericardial effusion measuring up to 12 mm in thickness. Prominent aortic valve calcification. There is aortic and coronary atherosclerosis. Mediastinum/Nodes: Calcified mediastinal lymph nodes from remote granulomatous disease. Lungs/Pleura: Clustered nodularity with some calcification in the right upper lobe attributed to remote granulomatous disease. Along the lower right minor fissure is a subpleural nodule measuring 17 mm. There is a small borderline moderate layering right pleural effusion with right lower lobe atelectasis. Upper Abdomen: Cholelithiasis and partially covered right renal stenting. Post treatment changes to the left kidney. Musculoskeletal: Spondylosis with multi-level bridging osteophyte. IMPRESSION: 1. Confirmed 2 cm nodule in the right middle lobe with adjacent band of opacity along the lower major fissure. Hopefully this reflects a small focus of round atelectasis but neoplasm is not excluded. The finding is new from a 2015 abdominal CT. Consider one of the following in 3 months for both low-risk and high-risk individuals: (a) repeat chest CT, (b) follow-up PET-CT, or (c) tissue sampling. This recommendation follows the consensus statement: Guidelines for Management of Incidental Pulmonary Nodules Detected on CT Images: From the Fleischner Society 2017; Radiology  2017; 284:228-243. 2. Small borderline moderate right pleural effusion with atelectasis. 3. Pericardial effusion measuring up to 12 mm thickness. Electronically Signed   By: Monte Fantasia M.D.   On: 04/26/2019 11:17   DG Chest Portable 1 View  Result Date: 04/25/2019 CLINICAL DATA:  Shortness of breath. Bradycardia. EXAM: PORTABLE CHEST 1 VIEW COMPARISON:  12/09/2018 FINDINGS: Mild cardiomegaly remains stable. Pacemaker remains in place. Previously seen bibasilar pulmonary opacity is resolved since previous study. A nodular opacity is seen in the lateral right lung base. IMPRESSION: 1. Nodular opacity in lateral right lung base. Recommend chest CT to exclude pulmonary nodule. 2. Cardiomegaly. Electronically Signed   By: Marlaine Hind M.D.   On: 04/25/2019 17:11   DG ERCP BILIARY & PANCREATIC DUCTS  Result Date: 04/27/2019 CLINICAL DATA:  ERCP with sphincterotomy and removal of multiple CBD stones. EXAM: ERCP TECHNIQUE: Multiple spot images obtained with the fluoroscopic device and submitted for interpretation post-procedure. COMPARISON:  CT abdomen pelvis-04/25/2019 FLUOROSCOPY TIME:  4 minutes, 56 seconds (93.85 mGy) FINDINGS: Seven spot intraoperative fluoroscopic images of the right upper abdominal quadrant during ERCP are provided for review Initial image demonstrates the superior end of the patient's chronic right-sided percutaneous nephrostomy catheter Subsequent images demonstrate an ERCP probe overlying the right upper abdominal quadrant. There is selective cannulation opacification of the common bile duct which appears moderately dilated. There are at least 2 persistent nonocclusive filling defects within the common bile duct favored to represent choledocholithiasis Subsequent images demonstrate insufflation of a balloon within the central aspect of the CBD with subsequent biliary sweeping and presumed sphincterotomy. There is faint opacification of cystic duct with opacification of the gallbladder  lumen. There are multiple persistent filling defects within the opacified gallbladder lumen compatible known cholelithiasis. IMPRESSION: 1. Choledocholithiasis with subsequent biliary sweeping and stone extraction. 2. Cholelithiasis. These images were submitted for radiologic interpretation only. Please see the procedural report for the amount of contrast and the fluoroscopy time utilized. Electronically Signed   By: Sandi Mariscal M.D.   On: 04/27/2019 16:00   ECHOCARDIOGRAM COMPLETE  Result Date: 04/28/2019   ECHOCARDIOGRAM REPORT   Patient Name:   DUSTYN DANSEREAU Date of Exam: 04/28/2019 Medical Rec #:  [id number]          Height:       67.0 in Accession #:    [id number]         Weight:       159.4 lb Date of Birth:  Dec 01, 1921           BSA:          1.84 m Patient Age:    89 years           BP:           155/75 mmHg Patient Gender: M                  HR:           54 bpm. Exam Location:  Inpatient Procedure: 2D Echo, Cardiac Doppler and Color Doppler                                 MODIFIED REPORT:     This report was modified by Cherlynn Kaiser [name] on 04/28/2019 due to added                              comparison statement.  Indications:     Bacteremia.  History:         Patient has prior history of Echocardiogram examinations, most                  recent 12/10/2018. CHF, Abnormal ECG and Pacemaker; Risk                  Factors:Former Smoker and Hypertension. Cancer. Aortic                  stenosis.  Sonographer:     Roseanna Rainbow RDCS Referring Phys:  [place] Diagnosing Phys: Cherlynn Kaiser [name] IMPRESSIONS  1. Left ventricular ejection fraction, by visual estimation, is 25 to 30%. The left ventricle has severely decreased function.  There is moderately increased left ventricular hypertrophy.  2. Elevated left ventricular end-diastolic pressure.  3. Global right ventricle has mildly reduced systolic function.The right ventricular size is normal. No increase in right ventricular wall thickness.  4. Moderately  elevated pulmonary artery systolic pressure, RVSP 47 mmHg.  5. Left atrial size was moderately dilated.  6. The aortic valve is abnormal. Aortic valve regurgitation is mild to moderate. Severe, low flow low gradient aortic valve stenosis. Systolic mean gradient is approximately 38 mmHg. Dimensionless index 0.21. There is severe calcifcation of the aortic valve. Severely restricted leaflet motion.  7. Mild mitral annular calcification.  8. The mitral valve is abnormal. Mild to moderate mitral valve regurgitation. No evidence of mitral stenosis.  9. The tricuspid valve is normal in structure. Tricuspid valve regurgitation is moderate. 10. There is mild dilatation of the ascending aorta measuring 40 mm. 11. The inferior vena cava is normal in size with <50% respiratory variability, suggesting right atrial pressure of 8 mmHg. 12. Trivial pericardial effusion is present. Moderate pleural effusion. 13. The left ventricle demonstrates global hypokinesis. 14. Side by side comparison of images performed to study from 12/10/2018. LV function has not significantly changed. Aortic stenosis is now severe. AR, MR, and TR appear to have increased. FINDINGS  Left Ventricle: Left ventricular ejection fraction, by visual estimation, is 25 to 30%. The left ventricle has severely decreased function. The left ventricle demonstrates global hypokinesis. There is moderately increased left ventricular hypertrophy. Left ventricular diastolic parameters are indeterminate. Elevated left ventricular end-diastolic pressure. Right Ventricle: The right ventricular size is normal. No increase in right ventricular wall thickness. Global RV systolic function is has mildly reduced systolic function. The tricuspid regurgitant velocity is 3.11 m/s, and with an assumed right atrial pressure of 8 mmHg, the estimated right ventricular systolic pressure is moderately elevated at 46.6 mmHg. Left Atrium: Left atrial size was moderately dilated. Right Atrium:  Right atrial size was normal in size Pericardium: Trivial pericardial effusion is present. There is a moderate pleural effusion in either the left or right lateral region. Mitral Valve: The mitral valve is abnormal. There is mild thickening of the mitral valve leaflet(s). Mild mitral annular calcification. Mild to moderate mitral valve regurgitation. No evidence of mitral valve stenosis by observation. Tricuspid Valve: The tricuspid valve is normal in structure. Tricuspid valve regurgitation is moderate. Aortic Valve: The aortic valve is abnormal. Aortic valve regurgitation is mild to moderate. Aortic regurgitation PHT measures 608 msec. Severe aortic stenosis is present. There is severe calcifcation of the aortic valve. Aortic valve mean gradient measures 37.5 mmHg. Aortic valve peak gradient measures 62.3 mmHg. Aortic valve area, by VTI measures 0.61 cm. Pulmonic Valve: The pulmonic valve was normal in structure. Pulmonic valve regurgitation is trivial. Pulmonic regurgitation is trivial. Aorta: Aortic dilatation noted. There is mild dilatation of the ascending aorta measuring 40 mm. Venous: The inferior vena cava is normal in size with less than 50% respiratory variability, suggesting right atrial pressure of 8 mmHg. IAS/Shunts: The interatrial septum was not well visualized. Additional Comments: A [name] wire is visualized in the right atrium and right ventricle.  LEFT VENTRICLE PLAX 2D LVIDd:         4.71 cm       Diastology LVIDs:         4.14 cm       LV e' medial:   2.81 cm/s LV PW:         1.49 cm  LV E/e' medial: 38.0 LV IVS:        1.38 cm LVOT diam:     2.10 cm LV SV:         27 ml LV SV Index:   14.49 LVOT Area:     3.46 cm  LV Volumes (MOD) LV area d, A2C:    37.10 cm LV area d, A4C:    38.40 cm LV area s, A2C:    30.60 cm LV area s, A4C:    33.80 cm LV major d, A2C:   9.25 cm LV major d, A4C:   8.65 cm LV major s, A2C:   8.72 cm LV major s, A4C:   8.42 cm LV vol d, MOD A2C: 122.0 ml LV vol d,  MOD A4C: 138.0 ml LV vol s, MOD A2C: 87.7 ml LV vol s, MOD A4C: 109.0 ml LV SV MOD A2C:     34.3 ml LV SV MOD A4C:     138.0 ml LV SV MOD BP:      34.3 ml RIGHT VENTRICLE TAPSE (M-mode): 2.1 cm LEFT ATRIUM             Index       RIGHT ATRIUM           Index LA diam:        3.70 cm 2.01 cm/m  RA Area:     17.60 cm LA Vol (A2C):   86.7 ml 47.21 ml/m RA Volume:   48.00 ml  26.14 ml/m LA Vol (A4C):   71.4 ml 38.88 ml/m LA Biplane Vol: 85.6 ml 46.62 ml/m  AORTIC VALVE                    PULMONIC VALVE AV Area (Vmax):    0.62 cm     PR End Diast Vel: 1.80 msec AV Area (Vmean):   0.55 cm AV Area (VTI):     0.61 cm AV Vmax:           394.60 cm/s AV Vmean:          283.400 cm/s AV VTI:            1.084 m AV Peak Grad:      62.3 mmHg AV Mean Grad:      37.5 mmHg LVOT Vmax:         70.70 cm/s LVOT Vmean:        45.400 cm/s LVOT VTI:          0.191 m LVOT/AV VTI ratio: 0.18 AI PHT:            608 msec  AORTA Ao Root diam: 3.00 cm Ao Asc diam:  4.00 cm MITRAL VALVE                         TRICUSPID VALVE MV Area (PHT): 2.87 cm              TR Peak grad:   38.6 mmHg MV PHT:        76.56 msec            TR Vmax:        326.00 cm/s MV Decel Time: 264 msec MR Peak grad:    132.7 mmHg          SHUNTS MR Mean grad:    78.0 mmHg           Systemic VTI:  0.19 m MR Vmax:  576.00 cm/s         Systemic Diam: 2.10 cm MR Vmean:        405.0 cm/s MR PISA:         1.01 cm MR PISA Eff ROA: 6 mm MR PISA Radius:  0.40 cm MV E velocity: 106.67 cm/s 103 cm/s MV A velocity: 100.65 cm/s 70.3 cm/s MV E/A ratio:  1.06        1.5  Cherlynn Kaiser [name] Electronically signed by Cherlynn Kaiser [name] Signature Date/Time: 04/28/2019/3:22:46 PM    Final (Updated)    CT RENAL STONE STUDY  Result Date: 04/25/2019 CLINICAL DATA:  Flank pain with kidney stones suspected EXAM: CT ABDOMEN AND PELVIS WITHOUT CONTRAST TECHNIQUE: Multidetector CT imaging of the abdomen and pelvis was performed following the standard protocol without IV contrast.  COMPARISON:  June 25, 2017 FINDINGS: Lower chest: There is a moderate to large right-sided pleural effusion that is only partially visualized on this exam. There is suggestion of a 1.9 cm subpleural nodule involving the right middle lobe (axial series 2, image 6).The heart is enlarged. There is a small pericardial effusion. Hepatobiliary: The liver is normal. Cholelithiasis without acute inflammation.Multiple stones are noted in the common bile duct. There is mild biliary ductal dilatation with the common bile duct measuring up to 1.4 cm. Pancreas: Normal contours without ductal dilatation. No peripancreatic fluid collection. Spleen: No splenic laceration or hematoma. Adrenals/Urinary Tract: --Adrenal glands: No adrenal hemorrhage. --Right kidney/ureter: There is a right-sided percutaneous nephrostomy tube. There is no evidence for hydronephrosis. The nephrostomy tube appears somewhat kinked but is likely functional given the lack of hydronephrosis. There is mild dilatation of the right ureter. --Left kidney/ureter: No evidence for hydronephrosis. There is a stone in the lower pole measuring approximately 1.4 cm. The patient may be status post prior left renal ablation. --Urinary bladder: The urinary bladder is surgically absent. An ileal conduit is noted. Stomach/Bowel: --Stomach/Duodenum: The stomach is moderately distended. --Small bowel: No dilatation or inflammation. --Colon: Rectosigmoid diverticulosis without acute inflammation. --Appendix: Not visualized. No right lower quadrant inflammation or free fluid. Vascular/Lymphatic: Atherosclerotic calcification is present within the non-aneurysmal abdominal aorta, without hemodynamically significant stenosis. --No retroperitoneal lymphadenopathy. --No mesenteric lymphadenopathy. --there are bilateral fat containing inguinal hernias, left greater than right. There is a low midline ventral wall hernia containing a loop of small bowel without evidence for obstruction  (axial series 2, image 68). Reproductive: Prostate gland is surgically absent. Other: No ascites or free air. The abdominal wall is normal. Musculoskeletal. No acute displaced fractures. IMPRESSION: 1. No specific abnormality to explain the patient's left flank pain. There is no left-sided hydronephrosis. There is a 1.4 cm nonobstructing stone in lower pole the left kidney. 2. Moderate to large right-sided pleural effusion. Small left-sided pleural effusion. 3. Cholelithiasis with CT evidence for choledocholithiasis. Multiple stones are noted in the common bile duct of both proximally and distally. The common bile duct is dilated and there is mild intrahepatic biliary ductal dilatation. There is no CT evidence for acute calculus cholecystitis. 4. Right-sided percutaneous nephrostomy tube in place. While the tube appears slightly tortuous and possibly kinked, the tube appears to be functioning appropriately as there is no right-sided hydronephrosis. 5. Findings concerning for 1.9 cm mass in the right middle lobe. Follow-up with a nonemergent contrast enhanced CT of the chest is recommended for further evaluation of this finding. 6. Multiple additional chronic findings as detailed above. Aortic Atherosclerosis (ICD10-I70.0). Electronically Signed   By: Jamie Kato.D.  On: 04/25/2019 18:50   US Abdomen Limited RUQ  Result Date: 04/25/2019 CLINICAL DATA:  86 year old male with right upper quadrant abdominal pain for 1 week. Cholelithiasis, choledocholithiasis, and right pleural effusion on CT Abdomen and Pelvis today. EXAM: ULTRASOUND ABDOMEN LIMITED RIGHT UPPER QUADRANT COMPARISON:  CT Abdomen and Pelvis 1818 hours today. FINDINGS: Gallbladder: Gallbladder wall thickness remains normal at 1 to 2 millimeters. Echogenic gallstones individually estimated at 14 millimeters (image 12). No pericholecystic fluid. No sonographic Murphy sign elicited. Common bile duct: Diameter: 13 millimeters, dilated. Cannot  visualize the filling defects within the ducts seen by CT today. Liver: Evidence of intrahepatic biliary ductal dilatation on image 48. Background liver echogenicity within normal limits. No discrete liver lesion. Portal vein is patent on color Doppler imaging with normal direction of blood flow towards the liver. Other: Right pleural effusion redemonstrated. IMPRESSION: 1. Dilated intra- and extrahepatic biliary tree due to the multifocal Choledocholithiasis demonstrated by CT today. 2. Cholelithiasis, no superimposed acute cholecystitis. 3. Right pleural effusion redemonstrated. Electronically Signed   By: Genevie Ann M.D.   On: 04/25/2019 19:50        No chest pain  Assessment and Plan:   1. Bacteremia/Sepsis due to cholangitis with known severe AS and PPM, high risk for endocarditis, ID wanted input on proceeding with TEE.  Though now with acute GI bleed.  2. AS, severe low flow, low gradient severe aortic stenosis. but not a candidate for TAVR or open AV replacement due to high risk of infection, episodes of dizziness at home may be related, .. Dr. Verdon Cummins to see 3. Cardiomyopathy EF 20-25% , now 25 to 30% and some RV dysfunction   4. Systolic HF with Rt pleural effusion on lasix as outpt 20 mg  Today +1798 wt up from 69.2 to 72.3 Kg   Had been on lasix 20 mg here but today rec'd 40 mg IV with upper arm edema. BNP elevated but would expect some with his AS 5. CKD-4 Cr today 2.72 up [name] 2.03  6. Hyperkalemia 5.5 today  7. Elevated LFTs due to gallstone now removed and they are improving.  8. Acute GI bleed with 4 bloody BMs today and Hgb drop from 9.8 at 0600 to 7.7 now.  Notified IM of drop in Hgb  ASA and lovenox stopped 9. Cholangitis (enterococcal baceteremia) - s/p CBD stone removal and sphincterotomy GI following  10. Bradycardia with hx of [name] PPM - has A/V pacing and sensing.  Last interrogated in Nov 2020      For questions or updates, please contact [place] Please consult  [email or web address] for contact info under     Signed, Cecilie Kicks, NP  04/29/2019 11:26 AM  I have seen and examined the patient along with Cecilie Kicks, NP .  I have reviewed the chart, notes and new data.  I agree with PA/NP's note.  Key new complaints: a truly delightful gentleman, [place] veteran. No complaints right now Key examination changes: RRR with periods of bigeminy, mid-peaking Ao ej murmur, laterally displaced apical impulse, delayed carotid pulses, clear lungs, no JVD, no edema Key new findings / data: mostly A paced, V paced rhythm with frequent PVCs, echo shows severely depressed LVEF progression of AS (worsened dimensionless obstructive index as 0.21 consistent with true worsening of AS, not increased cardiac output). Reviewed his discussions re: TAVR w [name] [name]. He was felt to be an inappropriate candidate for TAVR even before the current hospitalization. The current events reinforce that opinion. Question  re: TEE for evaluation for endocarditis due to enterococcal (non-faecalis) bacteremia.  PLAN: He is disinclined to undergo any additional procedures, such as TEE. Discussed the fact that a positive diagnosis of TEE would change they choice of antibiotics and the duration of IV antibiotics. He appears to be "atrially" pacemaker dependent based on most recent device download (no intrinsic atrial activity, but does have AV conduction at slow rates), but he does require a very high frequency of V pacing. He would not be a candidate for device extraction. I believe he could tolerate a TEE, since he did well with ERCP. Will discuss with [name] [name] re: TEE, but the patient definitely appears to prefer less invasive therapy and would rather have empirical ABx therapy, without TEE.  Sanda Klein, [name], [place] [phone] 04/29/2019, 2:01 PM

## 2019-04-29 NOTE — Progress Notes (Addendum)
PROGRESS NOTE    [name] [name]    Code Status: DNR  KDT:[id number] DOB: 08/06/1921 DOA: 04/25/2019  PCP: Crist Infante, MD    Hospital Summary  This is a 86 year old male with history of prostate and bladder cancer in 2006 with ileal conduit/urethral stricture, sinus node dysfunction status post PPM dual-chamber, aortic stenosis, gout, DVT who presented to [facility] with fever and left flank pain on 1/31 and shortness of breath, admitted for sepsis and found to have dural coccal bacteremia as well as found to have nonobstructing left renal stone and found to have elevated LFTs, Noncon CT showed CBD stones and intrahepatic and extrahepatic biliary dilation.  GI was consulted and he underwent ERCP with sphincterotomy with Dr. Watt Climes on 2/2.  For bacteremia, ID was consulted and recommended TTE and continue Zosyn.  At family's request, palliative care was consulted for [place] discussion who recommended home-based palliative care at discharge  A & P   Principal Problem:   Enterococcal bacteremia Active Problems:   Hypertension   Pacemaker   Aortic stenosis   CKD (chronic kidney disease), stage IV (HCC)   Acute lower UTI   Sepsis ([place])   Bladder cancer ([place])   Acute on chronic diastolic CHF (congestive heart failure) (HCC)   Abnormal liver function   Choledocholithiasis   Status post ileal conduit (HCC)   Macrocytic anemia   HOH (hard of hearing)   History of prostate cancer   Choledocholithiasis/cholangitis status post ERCP and sphincterotomy 04/27/2019 with Dr. Watt Climes LFTs improving GI on board Melanotic Stool Possibly related to recent sphincterotomy versus lower source Follow-up H&H Transfuse for hemoglobin <7.0, patient consented Clear liquid diet Follow-up with GI Hold aspirin, Lovenox, add SCDs Acute blood loss anemia secondary to GI bleed with history of normal to macrocytic anemia. Plan as above Sepsis on admission Suspect multifactorial: UTI unknown organism,  Enterococcus bacteremia, cholangitis Sepsis resolved Continue Zosyn, appreciate ID further recommendations Enterococcus bacteremia, unknown source Day 4/TBD Zosyn TTE without vegetations, cardiology consulted for possible TEE Appreciate ID recommendations Right pleural effusion Tolerating room air Severe aortic stenosis worsened from previous echo on 01/28/2019 cardio consulted HFrEF EF similar to prior, 20 to 25% Weight up 68.9 on admission, not obtained today Given dose of IV Lasix Continue beta-blocker Daily weights Cardiology consulted Continue telemetry Edematous upper extremities Keep arms elevated above heart level Sick sinus syndrome status post PPM Elevated troponin, suspect demand ischemia Continue telemetry CKD 4 Creatinine 2.4-> 2.72,  baseline creatinine 2-2.3 Lasix as above Hyperkalemia K persistent 5.5 Lasix as above Hypothyroid stable on levothyroxine Gout Continue current regimen Bladder/prostate cancer history with ileal conduit  DVT prophylaxis: Holding Lovenox, added SCDs Diet: Clear liquid Family Communication: Patient's daughter has been updated at bedside Disposition Plan: Remains inpatient with plan for home health with PT at discharge, barrier to discharge is medical stability  Consultants  GI ID Cardiology Palliative care  Procedures  ERCP with sphincterotomy 04/27/2019  Antibiotics   Anti-infectives (From admission, onward)    Start     Dose/Rate Route Frequency Ordered Stop   04/27/19 2100  vancomycin (VANCOCIN) IVPB 1000 mg/200 mL premix  Status:  Discontinued     1,000 mg 200 mL/hr over 60 Minutes Intravenous Every 48 hours 04/25/19 2129 04/26/19 1808   04/26/19 0200  piperacillin-tazobactam (ZOSYN) IVPB 2.25 g     2.25 g 100 mL/hr over 30 Minutes Intravenous Every 6 hours 04/25/19 2129     04/25/19 2045  vancomycin (VANCOCIN) IVPB 1000 mg/200 mL  premix     1,000 mg 200 mL/hr over 60 Minutes Intravenous  Once 04/25/19 2040  04/26/19 0028   04/25/19 1915  piperacillin-tazobactam (ZOSYN) IVPB 3.375 g     3.375 g 100 mL/hr over 30 Minutes Intravenous  Once 04/25/19 1911 04/25/19 2102            Subjective   Patient reports an episode of abdominal pain last night and nurse reports melanotic stool x1 this a.m. has not had recurrent melanotic stool since.  GI has been made aware.  Patient denies any other complaints at this time any abdominal pain, chest pain or shortness of breath.  Objective   Vitals:   04/28/19 0500 04/28/19 2036 04/29/19 0612 04/29/19 0932  BP:  (!) 134/51 (!) 144/59 (!) 150/66  Pulse:  68 100 63  Resp:  18 17   Temp:  (!) 97.4 F (36.3 C) 98.1 F (36.7 C)   TempSrc:  Oral    SpO2:  98% 96% 99%  Weight: 72.3 kg     Height:        Intake/Output Summary (Last 24 hours) at 04/29/2019 1234 Last data filed at 04/28/2019 1800 Gross per 24 hour  Intake --  Output 350 ml  Net -350 ml   Filed Weights   04/26/19 0500 04/27/19 1337 04/28/19 0500  Weight: 70.2 kg 70.2 kg 72.3 kg    Examination:  Physical Exam Vitals and nursing note reviewed.  HENT:     Head: Normocephalic.     Mouth/Throat:     Mouth: Mucous membranes are moist.  Eyes:     Conjunctiva/sclera: Conjunctivae normal.  Cardiovascular:     Rate and Rhythm: Normal rate.     Heart sounds: Murmur present.  Pulmonary:     Effort: Pulmonary effort is normal. No respiratory distress.  Abdominal:     General: Abdomen is flat. There is no distension.     Tenderness: There is no abdominal tenderness.  Musculoskeletal:        General: No tenderness.     Cervical back: Normal range of motion.     Comments: Bilateral upper extremity edema  Skin:    Coloration: Skin is jaundiced.  Neurological:     Mental Status: He is alert. Mental status is at baseline.  Psychiatric:        Mood and Affect: Mood normal.        Behavior: Behavior normal.     Data Reviewed: I have personally reviewed following labs and imaging  studies  CBC: Recent Labs  Lab 04/25/19 1749 04/25/19 1749 04/26/19 0425 04/27/19 0419 04/28/19 0449 04/29/19 0521 04/29/19 1135  WBC 15.2*   < > 21.8* 10.5 7.1 9.2 7.4  NEUTROABS 13.9*  --   --  8.6*  --   --   --   HGB 11.5*   < > 9.7* 9.6* 9.8* 8.3* 7.7*  HCT 36.0*   < > 30.7* 30.4* 32.1* 25.5* 24.3*  MCV 102.9*   < > 102.3* 101.7* 105.2* 100.4* 99.6  PLT 231   < > 183 157 178 183 172   < > = values in this interval not displayed.   Basic Metabolic Panel: Recent Labs  Lab 04/25/19 1749 04/26/19 0425 04/27/19 0419 04/28/19 0449 04/29/19 0521  NA 139 138 138 137 139  K 4.9 4.9 4.6 5.5* 5.5*  CL 105 106 107 109 112*  CO2 26 22 21* 20* 19*  GLUCOSE 98 108* 92 168* 90  BUN 43* 44*  43* 43* 49*  CREATININE 2.03* 2.04* 2.25* 2.42* 2.72*  CALCIUM 8.5* 7.4* 7.7* 7.5* 7.4*   GFR: Estimated Creatinine Clearance: 15.2 mL/min (A) (by C-G formula based on SCr of 2.72 mg/dL (H)). Liver Function Tests: Recent Labs  Lab 04/25/19 1749 04/26/19 0425 04/27/19 0419 04/28/19 0449 04/29/19 0521  AST 487* 501* 231* 142* 151*  ALT 227* 292* 196* 162* 144*  ALKPHOS 184* 157* 137* 152* 142*  BILITOT 2.4* 3.4* 3.3* 3.0* 2.3*  PROT 7.5 6.0* 5.9* 6.3* 5.5*  ALBUMIN 3.5 2.7* 2.6* 2.5* 2.2*   Recent Labs  Lab 04/25/19 1749  LIPASE 33   No results for input(s): AMMONIA in the last 168 hours. Coagulation Profile: No results for input(s): INR, PROTIME in the last 168 hours. Cardiac Enzymes: Recent Labs  Lab 04/25/19 2225  CKTOTAL 41*  CKMB 1.9   BNP (last 3 results) No results for input(s): PROBNP in the last 8760 hours. HbA1C: No results for input(s): HGBA1C in the last 72 hours. CBG: No results for input(s): GLUCAP in the last 168 hours. Lipid Profile: No results for input(s): CHOL, HDL, LDLCALC, TRIG, CHOLHDL, LDLDIRECT in the last 72 hours. Thyroid Function Tests: No results for input(s): TSH, T4TOTAL, FREET4, T3FREE, THYROIDAB in the last 72 hours. Anemia Panel: No  results for input(s): VITAMINB12, FOLATE, FERRITIN, TIBC, IRON, RETICCTPCT in the last 72 hours. Sepsis Labs: Recent Labs  Lab 04/25/19 2033 04/25/19 2225  LATICACIDVEN 1.6 2.2*    Recent Results (from the past 240 hour(s))  Urine Culture     Status: Abnormal   Collection Time: 04/25/19  5:47 PM   Specimen: Urine, Random  Result Value Ref Range Status   Specimen Description   Final    URINE, RANDOM Performed at [facility] [address], [place], [place] [address]    Special Requests   Final    RIGHT NEPHROSTMY Performed at [facility] [address], [place], [place] [address]    Culture MULTIPLE SPECIES PRESENT, SUGGEST RECOLLECTION (A)  Final   Report Status 04/26/2019 FINAL  Final  Urine culture     Status: Abnormal   Collection Time: 04/25/19  5:49 PM   Specimen: Urine, Random  Result Value Ref Range Status   Specimen Description   Final    URINE, RANDOM Performed at [facility] [address], [place], [place] [address]    Special Requests   Final    NONE Performed at [facility], [place] [address], [place], [place] [address]    Culture MULTIPLE SPECIES PRESENT, SUGGEST RECOLLECTION (A)  Final   Report Status 04/26/2019 FINAL  Final  Respiratory Panel by RT PCR (Flu A&B, Covid) - Nasopharyngeal Swab     Status: None   Collection Time: 04/25/19  6:53 PM   Specimen: Nasopharyngeal Swab  Result Value Ref Range Status   SARS Coronavirus 2 by RT PCR NEGATIVE NEGATIVE Final    Comment: (NOTE) SARS-CoV-2 target nucleic acids are NOT DETECTED. The SARS-CoV-2 RNA is generally detectable in upper respiratoy specimens during the acute phase of infection. The lowest concentration of SARS-CoV-2 viral copies this assay can detect is 131 copies/mL. A negative result does not preclude SARS-Cov-2 infection and should not be used as the sole basis for treatment or other patient management decisions.  A negative result may occur with  improper specimen collection/handling, submission of specimen other than nasopharyngeal swab, presence of viral mutation(s) within the areas targeted by this assay, and  inadequate number of viral copies (<131 copies/mL). A negative result must be combined with clinical observations, patient history, and epidemiological information. The expected result is Negative. Fact Sheet for Patients:  PinkCheek.be Fact Sheet for Healthcare Providers:  GravelBags.it This test is not yet ap proved or cleared by the [place] FDA and  has been authorized for detection and/or diagnosis of SARS-CoV-2 by FDA under an Emergency Use Authorization (EUA). This EUA will remain  in effect (meaning this test can be used) for the duration of the COVID-19 declaration under Section 564(b)(1) of the Act, 21 U.S.C. section 360bbb-3(b)(1), unless the authorization is terminated or revoked sooner.    Influenza A by PCR NEGATIVE NEGATIVE Final   Influenza B by PCR NEGATIVE NEGATIVE Final    Comment: (NOTE) The Xpert Xpress SARS-CoV-2/FLU/RSV assay is intended as an aid in  the diagnosis of influenza from Nasopharyngeal swab specimens and  should not be used as a sole basis for treatment. Nasal washings and  aspirates are unacceptable for Xpert Xpress SARS-CoV-2/FLU/RSV  testing. Fact Sheet for Patients: PinkCheek.be Fact Sheet for Healthcare Providers: GravelBags.it This test is not yet approved or cleared by the [place] FDA and  has been authorized for detection and/or diagnosis of SARS-CoV-2 by  FDA under an Emergency Use Authorization (EUA). This EUA will remain  in effect (meaning this test can be used) for the duration of the  Covid-19 declaration under Section 564(b)(1) of the Act, 21  U.S.C. section 360bbb-3(b)(1), unless the authorization is   terminated or revoked. Performed at [facility], [place] [address], [place], [place] [address]   Blood culture (routine x 2)     Status: Abnormal   Collection Time: 04/25/19  8:29 PM   Specimen: BLOOD  Result Value Ref Range Status   Specimen Description BLOOD LEFT ANTECUBITAL  Final   Special Requests   Final    BOTTLES DRAWN AEROBIC AND ANAEROBIC Blood Culture adequate volume Performed at [facility] [address] [place], [place] [address]    Culture  Setup Time   Final    GRAM POSITIVE COCCI IN BOTH AEROBIC AND ANAEROBIC BOTTLES CRITICAL RESULT CALLED TO, READ BACK BY AND VERIFIED WITH: JUSTIN LEGGE @1455  04/26/19 AKT    Culture (A)  Final    ENTEROCOCCUS CASSELIFLAVUS SUSCEPTIBILITIES PERFORMED ON PREVIOUS CULTURE WITHIN THE LAST 5 DAYS. Performed at [facility], [place] [address], [place], [place] [address]    Report Status 04/28/2019 FINAL  Final  Blood culture (routine x 2)     Status: Abnormal   Collection Time: 04/25/19  8:33 PM   Specimen: BLOOD RIGHT FOREARM  Result Value Ref Range Status   Specimen Description BLOOD RIGHT FOREARM  Final   Special Requests   Final    BOTTLES DRAWN AEROBIC AND ANAEROBIC Blood Culture adequate volume Performed at [facility] [address], [place], [place] [address]    Culture  Setup Time   Final    GRAM POSITIVE COCCI IN BOTH AEROBIC AND ANAEROBIC BOTTLES CRITICAL VALUE NOTED.  VALUE IS CONSISTENT WITH PREVIOUSLY REPORTED AND CALLED VALUE. AKT Organism ID to follow CRITICAL RESULT CALLED TO, READ BACK BY AND VERIFIED WITH: Christean Grief [facility] 04/26/19 1750 JDW Performed at [facility], [place] [address] [place], [place] [address]    Culture ENTEROCOCCUS CASSELIFLAVUS (A)  Final   Report Status 04/28/2019 FINAL  Final   Organism ID, Bacteria ENTEROCOCCUS CASSELIFLAVUS  Final  Susceptibility   Enterococcus casseliflavus - MIC*    AMPICILLIN <=2 SENSITIVE  Sensitive     VANCOMYCIN RESISTANT Resistant     GENTAMICIN SYNERGY SENSITIVE Sensitive     LINEZOLID 2 SENSITIVE Sensitive     * ENTEROCOCCUS CASSELIFLAVUS  Blood Culture ID Panel (Reflexed)     Status: Abnormal   Collection Time: 04/25/19  8:33 PM  Result Value Ref Range Status   Enterococcus species DETECTED (A) NOT DETECTED Final    Comment: CRITICAL RESULT CALLED TO, READ BACK BY AND VERIFIED WITH: J LEGGE PHARMD 04/26/19 1750 JDW    Vancomycin resistance NOT DETECTED NOT DETECTED Final   Listeria monocytogenes NOT DETECTED NOT DETECTED Final   Staphylococcus species NOT DETECTED NOT DETECTED Final   Staphylococcus aureus (BCID) NOT DETECTED NOT DETECTED Final   Streptococcus species NOT DETECTED NOT DETECTED Final   Streptococcus agalactiae NOT DETECTED NOT DETECTED Final   Streptococcus pneumoniae NOT DETECTED NOT DETECTED Final   Streptococcus pyogenes NOT DETECTED NOT DETECTED Final   Acinetobacter baumannii NOT DETECTED NOT DETECTED Final   Enterobacteriaceae species NOT DETECTED NOT DETECTED Final   Enterobacter cloacae complex NOT DETECTED NOT DETECTED Final   Escherichia coli NOT DETECTED NOT DETECTED Final   Klebsiella oxytoca NOT DETECTED NOT DETECTED Final   Klebsiella pneumoniae NOT DETECTED NOT DETECTED Final   Proteus species NOT DETECTED NOT DETECTED Final   Serratia marcescens NOT DETECTED NOT DETECTED Final   Haemophilus influenzae NOT DETECTED NOT DETECTED Final   Neisseria meningitidis NOT DETECTED NOT DETECTED Final   Pseudomonas aeruginosa NOT DETECTED NOT DETECTED Final   Candida albicans NOT DETECTED NOT DETECTED Final   Candida glabrata NOT DETECTED NOT DETECTED Final   Candida krusei NOT DETECTED NOT DETECTED Final   Candida parapsilosis NOT DETECTED NOT DETECTED Final   Candida tropicalis NOT DETECTED NOT DETECTED Final    Comment: Performed at [facility], [address], [place], [place] [address]  Culture, blood (routine x 2)     Status:  None (Preliminary result)   Collection Time: 04/27/19 10:14 AM   Specimen: BLOOD LEFT HAND  Result Value Ref Range Status   Specimen Description   Final    BLOOD LEFT HAND Performed at [facility] [address], [place], [place] [address]    Special Requests   Final    BOTTLES DRAWN AEROBIC ONLY Blood Culture adequate volume Performed at [facility] [address], [place], [place] [address]    Culture   Final    NO GROWTH 2 DAYS Performed at [facility] [address], [place], [place] [address]    Report Status PENDING  Incomplete  Culture, blood (routine x 2)     Status: None (Preliminary result)   Collection Time: 04/27/19 10:14 AM   Specimen: BLOOD  Result Value Ref Range Status   Specimen Description   Final    BLOOD LEFT ARM Performed at [facility] [address], [place], [place] [address]    Special Requests   Final    BOTTLES DRAWN AEROBIC ONLY Blood Culture adequate volume Performed at [facility] [address], [place], [place] [address]    Culture   Final    NO GROWTH 2 DAYS Performed at [facility] [address], [place],  [address]    Report Status PENDING  Incomplete         Radiology Studies: DG ERCP BILIARY &  PANCREATIC DUCTS  Result Date: 04/27/2019 CLINICAL DATA:  ERCP with sphincterotomy and removal of multiple CBD stones. EXAM: ERCP TECHNIQUE: Multiple spot images obtained with the fluoroscopic device and submitted for interpretation post-procedure. COMPARISON:  CT abdomen pelvis-04/25/2019 FLUOROSCOPY TIME:  4 minutes, 56 seconds (93.85 mGy) FINDINGS: Seven spot intraoperative fluoroscopic images of the right upper abdominal quadrant during ERCP are provided for review Initial image demonstrates the superior end of the patient's chronic right-sided percutaneous nephrostomy catheter Subsequent images demonstrate an ERCP probe overlying the  right upper abdominal quadrant. There is selective cannulation opacification of the common bile duct which appears moderately dilated. There are at least 2 persistent nonocclusive filling defects within the common bile duct favored to represent choledocholithiasis Subsequent images demonstrate insufflation of a balloon within the central aspect of the CBD with subsequent biliary sweeping and presumed sphincterotomy. There is faint opacification of cystic duct with opacification of the gallbladder lumen. There are multiple persistent filling defects within the opacified gallbladder lumen compatible known cholelithiasis. IMPRESSION: 1. Choledocholithiasis with subsequent biliary sweeping and stone extraction. 2. Cholelithiasis. These images were submitted for radiologic interpretation only. Please see the procedural report for the amount of contrast and the fluoroscopy time utilized. Electronically Signed   By: Sandi Mariscal M.D.   On: 04/27/2019 16:00   ECHOCARDIOGRAM COMPLETE  Result Date: 04/28/2019   ECHOCARDIOGRAM REPORT   Patient Name:   [name] [name] Date of Exam: 04/28/2019 Medical Rec #:  [id number]          Height:       67.0 in Accession #:    [id number]         Weight:       159.4 lb Date of Birth:  1921-12-28           BSA:          1.84 m Patient Age:    89 years           BP:           155/75 mmHg Patient Gender: M                  HR:           54 bpm. Exam Location:  Inpatient Procedure: 2D Echo, Cardiac Doppler and Color Doppler                                 MODIFIED REPORT:     This report was modified by Cherlynn Kaiser MD on 04/28/2019 due to added                              comparison statement.  Indications:     Bacteremia.  History:         Patient has prior history of Echocardiogram examinations, most                  recent 12/10/2018. CHF, Abnormal ECG and Pacemaker; Risk                  Factors:Former Smoker and Hypertension. Cancer. Aortic                  stenosis.  Sonographer:      Roseanna Rainbow RDCS Referring Phys:  Real Diagnosing Phys: Cherlynn Kaiser MD IMPRESSIONS  1. Left ventricular ejection fraction, by visual estimation, is  25 to 30%. The left ventricle has severely decreased function. There is moderately increased left ventricular hypertrophy.  2. Elevated left ventricular end-diastolic pressure.  3. Global right ventricle has mildly reduced systolic function.The right ventricular size is normal. No increase in right ventricular wall thickness.  4. Moderately elevated pulmonary artery systolic pressure, RVSP 47 mmHg.  5. Left atrial size was moderately dilated.  6. The aortic valve is abnormal. Aortic valve regurgitation is mild to moderate. Severe, low flow low gradient aortic valve stenosis. Systolic mean gradient is approximately 38 mmHg. Dimensionless index 0.21. There is severe calcifcation of the aortic valve. Severely restricted leaflet motion.  7. Mild mitral annular calcification.  8. The mitral valve is abnormal. Mild to moderate mitral valve regurgitation. No evidence of mitral stenosis.  9. The tricuspid valve is normal in structure. Tricuspid valve regurgitation is moderate. 10. There is mild dilatation of the ascending aorta measuring 40 mm. 11. The inferior vena cava is normal in size with <50% respiratory variability, suggesting right atrial pressure of 8 mmHg. 12. Trivial pericardial effusion is present. Moderate pleural effusion. 13. The left ventricle demonstrates global hypokinesis. 14. Side by side comparison of images performed to study from 12/10/2018. LV function has not significantly changed. Aortic stenosis is now severe. AR, MR, and TR appear to have increased. FINDINGS  Left Ventricle: Left ventricular ejection fraction, by visual estimation, is 25 to 30%. The left ventricle has severely decreased function. The left ventricle demonstrates global hypokinesis. There is moderately increased left ventricular hypertrophy. Left ventricular diastolic  parameters are indeterminate. Elevated left ventricular end-diastolic pressure. Right Ventricle: The right ventricular size is normal. No increase in right ventricular wall thickness. Global RV systolic function is has mildly reduced systolic function. The tricuspid regurgitant velocity is 3.11 m/s, and with an assumed right atrial pressure of 8 mmHg, the estimated right ventricular systolic pressure is moderately elevated at 46.6 mmHg. Left Atrium: Left atrial size was moderately dilated. Right Atrium: Right atrial size was normal in size Pericardium: Trivial pericardial effusion is present. There is a moderate pleural effusion in either the left or right lateral region. Mitral Valve: The mitral valve is abnormal. There is mild thickening of the mitral valve leaflet(s). Mild mitral annular calcification. Mild to moderate mitral valve regurgitation. No evidence of mitral valve stenosis by observation. Tricuspid Valve: The tricuspid valve is normal in structure. Tricuspid valve regurgitation is moderate. Aortic Valve: The aortic valve is abnormal. Aortic valve regurgitation is mild to moderate. Aortic regurgitation PHT measures 608 msec. Severe aortic stenosis is present. There is severe calcifcation of the aortic valve. Aortic valve mean gradient measures 37.5 mmHg. Aortic valve peak gradient measures 62.3 mmHg. Aortic valve area, by VTI measures 0.61 cm. Pulmonic Valve: The pulmonic valve was normal in structure. Pulmonic valve regurgitation is trivial. Pulmonic regurgitation is trivial. Aorta: Aortic dilatation noted. There is mild dilatation of the ascending aorta measuring 40 mm. Venous: The inferior vena cava is normal in size with less than 50% respiratory variability, suggesting right atrial pressure of 8 mmHg. IAS/Shunts: The interatrial septum was not well visualized. Additional Comments: A pacer wire is visualized in the right atrium and right ventricle.  LEFT VENTRICLE PLAX 2D LVIDd:         4.71 cm        Diastology LVIDs:         4.14 cm       LV e' medial:   2.81 cm/s LV PW:  1.49 cm       LV E/e' medial: 38.0 LV IVS:        1.38 cm LVOT diam:     2.10 cm LV SV:         27 ml LV SV Index:   14.49 LVOT Area:     3.46 cm  LV Volumes (MOD) LV area d, A2C:    37.10 cm LV area d, A4C:    38.40 cm LV area s, A2C:    30.60 cm LV area s, A4C:    33.80 cm LV major d, A2C:   9.25 cm LV major d, A4C:   8.65 cm LV major s, A2C:   8.72 cm LV major s, A4C:   8.42 cm LV vol d, MOD A2C: 122.0 ml LV vol d, MOD A4C: 138.0 ml LV vol s, MOD A2C: 87.7 ml LV vol s, MOD A4C: 109.0 ml LV SV MOD A2C:     34.3 ml LV SV MOD A4C:     138.0 ml LV SV MOD BP:      34.3 ml RIGHT VENTRICLE TAPSE (M-mode): 2.1 cm LEFT ATRIUM             Index       RIGHT ATRIUM           Index LA diam:        3.70 cm 2.01 cm/m  RA Area:     17.60 cm LA Vol (A2C):   86.7 ml 47.21 ml/m RA Volume:   48.00 ml  26.14 ml/m LA Vol (A4C):   71.4 ml 38.88 ml/m LA Biplane Vol: 85.6 ml 46.62 ml/m  AORTIC VALVE                    PULMONIC VALVE AV Area (Vmax):    0.62 cm     PR End Diast Vel: 1.80 msec AV Area (Vmean):   0.55 cm AV Area (VTI):     0.61 cm AV Vmax:           394.60 cm/s AV Vmean:          283.400 cm/s AV VTI:            1.084 m AV Peak Grad:      62.3 mmHg AV Mean Grad:      37.5 mmHg LVOT Vmax:         70.70 cm/s LVOT Vmean:        45.400 cm/s LVOT VTI:          0.191 m LVOT/AV VTI ratio: 0.18 AI PHT:            608 msec  AORTA Ao Root diam: 3.00 cm Ao Asc diam:  4.00 cm MITRAL VALVE                         TRICUSPID VALVE MV Area (PHT): 2.87 cm              TR Peak grad:   38.6 mmHg MV PHT:        76.56 msec            TR Vmax:        326.00 cm/s MV Decel Time: 264 msec MR Peak grad:    132.7 mmHg          SHUNTS MR Mean grad:    78.0 mmHg           Systemic  VTI:  0.19 m MR Vmax:         576.00 cm/s         Systemic Diam: 2.10 cm MR Vmean:        405.0 cm/s MR PISA:         1.01 cm MR PISA Eff ROA: 6 mm MR PISA Radius:  0.40 cm MV E  velocity: 106.67 cm/s 103 cm/s MV A velocity: 100.65 cm/s 70.3 cm/s MV E/A ratio:  1.06        1.5  Cherlynn Kaiser MD Electronically signed by Cherlynn Kaiser MD Signature Date/Time: 04/28/2019/3:22:46 PM    Final (Updated)         Scheduled Meds:  aspirin EC  81 mg Oral Daily   cholecalciferol  1,000 Units Oral Daily   docusate sodium  300 mg Oral Daily   febuxostat  40 mg Oral Daily   levothyroxine  25 mcg Oral Q0600   metoprolol succinate  12.5 mg Oral Daily   multivitamin  1 tablet Oral Daily   pantoprazole (PROTONIX) IV  40 mg Intravenous QHS   Continuous Infusions:  sodium chloride 250 mL (04/29/19 1150)   piperacillin-tazobactam (ZOSYN)  IV 2.25 g (04/29/19 1156)     LOS: 4 days    Time spent: 33 minutes with over 50% of the time coordinating the patient's care    Harold Hedge, DO [facility] Pager [phone]  If 7PM-7AM, please contact night-coverage [email or web address] Password [facility] 04/29/2019, 12:34 PM

## 2019-04-29 NOTE — Progress Notes (Signed)
[facility] Gastroenterology Progress Note  [name] [name] 86 y.o. 30-Mar-1921   Subjective: Large maroon stool this morning. Lower abdominal pain X 2-3 hours overnight that has resolved. No vomiting. Nursing in room.  Objective: Vital signs: Vitals:   04/29/19 0612 04/29/19 0932  BP: (!) 144/59 (!) 150/66  Pulse: 100 63  Resp: 17   Temp: 98.1 F (36.7 C)   SpO2: 96% 99%    Physical Exam: Gen: elderly, lethargic, no acute distress, thin HEENT: anicteric sclera CV: RRR Chest: CTA B Abd: RUQ tenderness with guarding, otherwise nontender, soft, nondistended, +BS Ext: no edema  Lab Results: Recent Labs    04/28/19 0449 04/29/19 0521  NA 137 139  K 5.5* 5.5*  CL 109 112*  CO2 20* 19*  GLUCOSE 168* 90  BUN 43* 49*  CREATININE 2.42* 2.72*  CALCIUM 7.5* 7.4*   Recent Labs    04/28/19 0449 04/29/19 0521  AST 142* 151*  ALT 162* 144*  ALKPHOS 152* 142*  BILITOT 3.0* 2.3*  PROT 6.3* 5.5*  ALBUMIN 2.5* 2.2*   Recent Labs    04/27/19 0419 04/27/19 0419 04/28/19 0449 04/29/19 0521  WBC 10.5   < > 7.1 9.2  NEUTROABS 8.6*  --   --   --   HGB 9.6*   < > 9.8* 8.3*  HCT 30.4*   < > 32.1* 25.5*  MCV 101.7*   < > 105.2* 100.4*  PLT 157   < > 178 183   < > = values in this interval not displayed.      Assessment/Plan: Cholangitis (enterococcal baceteremia) - s/p CBD stone removal and sphincterotomy. Bloody stool this morning that could be related to recent sphincterotomy or could be a lower source. Doubt related to pyloric channel dilation. Hgb 8.3. Recheck H/H at noon. Change to clear liquids. Continue IV Abx. If rebleeding recurs, then may need evaluation of ampulla with side-viewing scope. Will follow.   [name] [name] 04/29/2019, 9:47 AM  Questions please call [phone] ID: [name] [name], male   DOB: 15-Dec-1921, 87 y.o.   MRN: [id number]

## 2019-04-29 NOTE — Progress Notes (Signed)
PT Cancellation Note  Patient Details Name: [name] [name] MRN: [id number] DOB: [birth year]   Cancelled Treatment:    Reason Eval/Treat Not Completed: Patient not medically ready(Spoke with RN, Janett Billow, and pt has continued to have drop in BP throughout the day and multiple bloody BM's. Will hold for today and follow up at later date/time when pt is medically ready and as schedule allows.)   Verner Mould, DPT Physical Therapist with [facility] [phone]  04/29/2019 2:13 PM

## 2019-04-29 NOTE — Progress Notes (Signed)
Pharmacist Heart Failure Core Measure Documentation  Assessment: [name] [name] has an EF documented as 25-30% on 04/28/19 by ECHO.  Rationale: Heart failure patients with left ventricular systolic dysfunction (LVSD) and an EF < 40% should be prescribed an angiotensin converting enzyme inhibitor (ACEI) or angiotensin receptor blocker (ARB) at discharge unless a contraindication is documented in the medical record.  This patient is not currently on an ACEI or ARB for HF.  This note is being placed in the record in order to provide documentation that a contraindication to the use of these agents is present for this encounter.  ACE Inhibitor or Angiotensin Receptor Blocker is contraindicated (specify all that apply)  []   ACEI allergy AND ARB allergy []   Angioedema []   Moderate or severe aortic stenosis []   Hyperkalemia []   Hypotension []   Renal artery stenosis [x]   Worsening renal function, preexisting renal disease or dysfunction   [name] [name] 04/29/2019 9:23 AM

## 2019-04-29 NOTE — Progress Notes (Signed)
Pt had large liquid bloody BM this morning, maroon in color. No previous episodes noted. GI by to see patient and made aware. Dr. Neysa Bonito made aware as well. VSS. Daughter at bedside and updated on above. Will continue to monitor.

## 2019-04-30 ENCOUNTER — Inpatient Hospital Stay: Payer: Self-pay

## 2019-04-30 LAB — PREPARE RBC (CROSSMATCH)

## 2019-04-30 LAB — CBC
HCT: 23.1 % — ABNORMAL LOW (ref 39.0–52.0)
Hemoglobin: 7 g/dL — ABNORMAL LOW (ref 13.0–17.0)
MCH: 32 pg (ref 26.0–34.0)
MCHC: 30.3 g/dL (ref 30.0–36.0)
MCV: 105.5 fL — ABNORMAL HIGH (ref 80.0–100.0)
Platelets: 166 10*3/uL (ref 150–400)
RBC: 2.19 MIL/uL — ABNORMAL LOW (ref 4.22–5.81)
RDW: 16.5 % — ABNORMAL HIGH (ref 11.5–15.5)
WBC: 6.2 10*3/uL (ref 4.0–10.5)
nRBC: 0 % (ref 0.0–0.2)

## 2019-04-30 LAB — HEMOGLOBIN AND HEMATOCRIT, BLOOD
HCT: 21.3 % — ABNORMAL LOW (ref 39.0–52.0)
HCT: 25 % — ABNORMAL LOW (ref 39.0–52.0)
Hemoglobin: 6.4 g/dL — CL (ref 13.0–17.0)
Hemoglobin: 7.8 g/dL — ABNORMAL LOW (ref 13.0–17.0)

## 2019-04-30 LAB — COMPREHENSIVE METABOLIC PANEL
ALT: 117 U/L — ABNORMAL HIGH (ref 0–44)
AST: 96 U/L — ABNORMAL HIGH (ref 15–41)
Albumin: 2.1 g/dL — ABNORMAL LOW (ref 3.5–5.0)
Alkaline Phosphatase: 130 U/L — ABNORMAL HIGH (ref 38–126)
Anion gap: 9 (ref 5–15)
BUN: 48 mg/dL — ABNORMAL HIGH (ref 8–23)
CO2: 17 mmol/L — ABNORMAL LOW (ref 22–32)
Calcium: 7.3 mg/dL — ABNORMAL LOW (ref 8.9–10.3)
Chloride: 116 mmol/L — ABNORMAL HIGH (ref 98–111)
Creatinine, Ser: 3.06 mg/dL — ABNORMAL HIGH (ref 0.61–1.24)
GFR calc Af Amer: 19 mL/min — ABNORMAL LOW (ref >60)
GFR calc non Af Amer: 16 mL/min — ABNORMAL LOW (ref >60)
Glucose, Bld: 117 mg/dL — ABNORMAL HIGH (ref 70–99)
Potassium: 4.9 mmol/L (ref 3.5–5.1)
Sodium: 142 mmol/L (ref 135–145)
Total Bilirubin: 0.8 mg/dL (ref 0.3–1.2)
Total Protein: 4.8 g/dL — ABNORMAL LOW (ref 6.5–8.1)

## 2019-04-30 LAB — MAGNESIUM: Magnesium: 2.2 mg/dL (ref 1.7–2.4)

## 2019-04-30 MED ORDER — SODIUM CHLORIDE 0.9 % IV SOLN
2.0000 g | Freq: Two times a day (BID) | INTRAVENOUS | Status: DC
  Administered 2019-04-30 – 2019-05-02 (×4): 2 g via INTRAVENOUS
  Filled 2019-04-30 (×4): qty 2

## 2019-04-30 MED ORDER — SODIUM CHLORIDE 0.9 % IV SOLN
2.0000 g | Freq: Two times a day (BID) | INTRAVENOUS | Status: DC
  Administered 2019-04-30 – 2019-05-05 (×10): 2 g via INTRAVENOUS
  Filled 2019-04-30 (×6): qty 2
  Filled 2019-04-30: qty 20
  Filled 2019-04-30: qty 2
  Filled 2019-04-30: qty 20
  Filled 2019-04-30 (×3): qty 2

## 2019-04-30 MED ORDER — FUROSEMIDE 10 MG/ML IJ SOLN
20.0000 mg | Freq: Once | INTRAMUSCULAR | Status: AC
  Administered 2019-04-30: 20 mg via INTRAVENOUS
  Filled 2019-04-30: qty 2

## 2019-04-30 MED ORDER — SODIUM CHLORIDE 0.9% IV SOLUTION: Freq: Once | INTRAVENOUS | Status: AC

## 2019-04-30 MED ORDER — PANTOPRAZOLE SODIUM 40 MG IV SOLR
40.0000 mg | Freq: Two times a day (BID) | INTRAVENOUS | Status: DC
  Administered 2019-04-30 – 2019-05-03 (×6): 40 mg via INTRAVENOUS
  Filled 2019-04-30 (×6): qty 40

## 2019-04-30 NOTE — Progress Notes (Signed)
Physical Therapy Treatment Patient Details Name: [name] [name] MRN: [id number] DOB: 05/27/1921 Today's Date: 04/30/2019    History of Present Illness 86 yo male admitted with UTI, possible cholelithiasis.  Pt found to have cholangitis with enterococcal bacteremia s/p choledocholithiasis removal and sphincterotomy (04/27/19) and developed rectal bleeding (maroon with clots), s/p 1 unit PRBCs 04/30/19.  Hx of aortic stenosis, CHF, pacemaker, prostate, Ca, bladder Ca, macular degeneration, DVT    PT Comments    Pt received PRBCs earlier and just finished using bed pan (RN reports frequent BMs however slowing down).  Pt ambulated with min/guard for safety in hallway with RW.  Pt with edematous UEs so repositioned on pillows and encouraged pt to perform 10 hand open/closes every hour.  Continue to recommend HHPT upon d/c.   Follow Up Recommendations  Home health PT;Supervision/Assistance - 24 hour     Equipment Recommendations  None recommended by PT    Recommendations for Other Services       Precautions / Restrictions Precautions Precautions: Fall Precaution Comments: R LQ urostomy, R nephrostomy    Mobility  Bed Mobility Overal bed mobility: Needs Assistance Bed Mobility: Supine to Sit;Sit to Supine     Supine to sit: Min assist Sit to supine: Min guard   General bed mobility comments: slight assist for trunk upright  Transfers Overall transfer level: Needs assistance Equipment used: Rolling walker (2 wheeled) Transfers: Sit to/from Stand Sit to Stand: Min guard         General transfer comment: min/guard for safety, verbal cues for safe technique  Ambulation/Gait Ambulation/Gait assistance: Min guard Gait Distance (Feet): 100 Feet Assistive device: Rolling walker (2 wheeled) Gait Pattern/deviations: Step-through pattern;Decreased stride length     General Gait Details: min/guard for safety, distance to tolerance, denies dizziness   Stairs              Wheelchair Mobility    Modified Rankin (Stroke Patients Only)       Balance Overall balance assessment: Needs assistance         Standing balance support: Bilateral upper extremity supported Standing balance-Leahy Scale: Poor                              Cognition Arousal/Alertness: Awake/alert Behavior During Therapy: WFL for tasks assessed/performed Overall Cognitive Status: Within Functional Limits for tasks assessed                                        Exercises      General Comments        Pertinent Vitals/Pain Pain Assessment: No/denies pain    Home Living                      Prior Function            PT Goals (current goals can now be found in the care plan section) Progress towards PT goals: Progressing toward goals    Frequency    Min 3X/week      PT Plan Current plan remains appropriate    Co-evaluation              AM-PAC PT "6 Clicks" Mobility   Outcome Measure  Help needed turning from your back to your side while in a flat bed without using bedrails?: A Little Help needed moving from  lying on your back to sitting on the side of a flat bed without using bedrails?: A Little Help needed moving to and from a bed to a chair (including a wheelchair)?: A Little Help needed standing up from a chair using your arms (e.g., wheelchair or bedside chair)?: A Little Help needed to walk in hospital room?: A Little Help needed climbing 3-5 steps with a railing? : A Little 6 Click Score: 18    End of Session Equipment Utilized During Treatment: Gait belt Activity Tolerance: Patient tolerated treatment well Patient left: in bed;with call bell/phone within reach;with bed alarm set;with family/visitor present Nurse Communication: Mobility status PT Visit Diagnosis: Unsteadiness on feet (R26.81);Muscle weakness (generalized) (M62.81)     Time: 0349-6116 PT Time Calculation (min) (ACUTE ONLY): 18  min  Charges:  $Gait Training: 8-22 mins                     Arlyce Dice, DPT Acute Rehabilitation Services Office: [phone]  York Ram E 04/30/2019, 3:59 PM

## 2019-04-30 NOTE — Progress Notes (Signed)
Recurrent GI bleeding and worsening anemia noted. Fortunately, he seems to be tolerating well so far. I would put plans for possible TEE on hold (and he wants to avoid that procedure). Will sign off, but please re-consult if his bleeding stabilizes and a TEE is felt to be critical for his care.  Symptom oriented palliative care appears appropriate for this elderly gentleman with critical AS, not candidate for repair.  Sanda Klein, MD, [facility] [phone] office [phone] pager

## 2019-04-30 NOTE — Progress Notes (Signed)
[facility] Gastroenterology Progress Note  VRISHANK MOSTER 86 y.o. Jan 25, 1922   Subjective: Bloody stool early this morning and none since then. Feels ok. Denies abdominal pain. Daughter in room.  Objective: Vital signs: Vitals:   04/30/19 1125 04/30/19 1145  BP: (!) 114/54 (!) 114/49  Pulse: 65 68  Resp: 20 (!) 24  Temp: 97.9 F (36.6 C) 97.6 F (36.4 C)  SpO2: 96% 97%    Physical Exam: Gen: lethargic, elderly, thin, no acute distress  HEENT: anicteric sclera CV: RRR Chest: CTA B Abd: LLQ tenderness with guarding, otherwise nontender, soft, nondistended, +BS, urostomy bag Ext: no edema  Lab Results: Recent Labs    04/29/19 0521 04/30/19 0028  NA 139 142  K 5.5* 4.9  CL 112* 116*  CO2 19* 17*  GLUCOSE 90 117*  BUN 49* 48*  CREATININE 2.72* 3.06*  CALCIUM 7.4* 7.3*  MG  --  2.2   Recent Labs    04/29/19 0521 04/30/19 0028  AST 151* 96*  ALT 144* 117*  ALKPHOS 142* 130*  BILITOT 2.3* 0.8  PROT 5.5* 4.8*  ALBUMIN 2.2* 2.1*   Recent Labs    04/29/19 1135 04/29/19 1801 04/30/19 0028 04/30/19 0802  WBC 7.4  --  6.2  --   HGB 7.7*   < > 7.0* 6.4*  HCT 24.3*   < > 23.1* 21.3*  MCV 99.6  --  105.5*  --   PLT 172  --  166  --    < > = values in this interval not displayed.      Assessment/Plan: Cholangitis with enterococcal bacteremia s/p choledocholithiasis removal and sphincterotomy (04/27/19) who developed rectal bleeding (maroon with clots) yesterday and overnight. Hgb 6.4 this morning and receiving 1 U PRBCs. Lovenox stopped yesterday. Question diverticular source. Sphincterotomy bleed in differential but with stable hemodynamics I think it is an unlikely source. Doubt related to duodenal ulcers which were superficial and doubt due to pyloric channel dilation. Change PPI to IV Q 12 hours. If rectal bleeding recurs and worsens, then will do a bleeding scan to check if source is duodenal vs lower GI source. Clear liquid diet ok. Supportive care. Will  follow.   [name] 04/30/2019, 12:52 PM  Questions please call [phone] ID: DELANTE KARAPETYAN, male   DOB: 1921/05/18, 86 y.o.   MRN: [id number]

## 2019-04-30 NOTE — Progress Notes (Signed)
PROGRESS NOTE    [name] [name]    Code Status: DNR  SWF:[id number] DOB: 08-Jul-1921 DOA: 04/25/2019  PCP: Crist Infante, MD    Hospital Summary  This is a 86 year old male with history of prostate and bladder cancer in 2006 with ileal conduit/urethral stricture, sinus node dysfunction status post PPM dual-chamber, aortic stenosis, gout, DVT who presented to [facility] with fever and left flank pain on 1/31 and shortness of breath, admitted for sepsis and found to have dural coccal bacteremia as well as found to have nonobstructing left renal stone and found to have elevated LFTs, Noncon CT showed CBD stones and intrahepatic and extrahepatic biliary dilation.  GI was consulted and he underwent ERCP with sphincterotomy with Dr. Watt Climes on 2/2.  For bacteremia, ID was consulted and recommended TTE and continue Zosyn.  At family's request, palliative care was consulted for [place] discussion who recommended home-based palliative care at discharge.  2/4 Acute blood loss anemia secondary to GI bleed. GI made aware Cardiology consulted for possible TEE and eval of severe AS  2/5 Hb 6.4. 1 u PRBCs transfused  A & P   Principal Problem:   Enterococcal bacteremia Active Problems:   Hypertension   Pacemaker   Aortic stenosis   CKD (chronic kidney disease), stage IV (HCC)   Acute lower UTI   Sepsis (HCC)   Bladder cancer (HCC)   Acute on chronic diastolic CHF (congestive heart failure) (HCC)   Abnormal liver function   Choledocholithiasis   Status post ileal conduit (HCC)   Macrocytic anemia   HOH (hard of hearing)   History of prostate cancer   1. Choledocholithiasis/cholangitis status post ERCP and sphincterotomy 04/27/2019 with Dr. Watt Climes a. LFTs improving b. GI on board 2. Acute Blood loss Anemia secondary to GI bleed a. Status post 1 unit PRBC today b. diverticular source more likely vs less likely sphincterotomy source c. Hb 6.4 this AM, 1u PRBC ordered and to be followed by a dose of  lasix d. Transfuse for hemoglobin <7.0, patient consented e. Okay for clear liquid diet per GI f. IV PPI twice daily g. Hold aspirin, Lovenox, add SCDs 3. Sepsis on admission, sepsis has resolved a. Suspect multifactorial: UTI unknown organism, Enterococcus bacteremia, cholangitis b. Zosyn-> renally dosed ampicillin and ceftriaxone per ID 4. Enterococcus casseliflavus bacteremia a. Zosyn-> renally dosed ampicillin and ceftriaxone per ID b. TTE without vegetations, No TEE per Cardio due to GI bleed, reach back out if still necessary once clinically stable c. PICC line 5. Right pleural effusion, stable a. Tolerating room air 6. Severe aortic stenosis a. worsened from previous echo on 01/28/2019 b. Not a candidate for repair c. Palliative care consulted 7. HFrEF a. EF similar to prior, 20 to 25% b. Weight up, 68.9 on admission->72.2 kg, not obtained today c. Continue beta-blocker d. Daily weights e. Cardiology consulted f. Continue telemetry 8. Edematous upper extremities a. Keep arms elevated above heart level 9. Sick sinus syndrome status post PPM 10. Elevated troponin, suspect demand ischemia a. Continue telemetry 11. AKI on CKD 4, suspect multifactorial: cardiorenal, blood loss anemia a. Creatinine 2.4-> 2.72->3.0,  baseline creatinine 2-2.3 b. Bladder scan c. If no improvement with above treatments, will consider Nephro consult 12. Hyperkalemia a. Improved with diuresis 13. Hypothyroid stable on levothyroxine 14. Gout a. Continue current regimen 15. Bladder/prostate cancer history with ileal conduit  DVT prophylaxis: Holding Lovenox, added SCDs Diet: Clear liquid Family Communication: Patient's daughter has been updated at bedside Disposition Plan: Remains inpatient  with plan for home health with PT at discharge, barrier to discharge is medical stability  Consultants  GI ID Cardiology Palliative care  Procedures  ERCP with sphincterotomy 04/27/2019  Antibiotics    Anti-infectives (From admission, onward)   Start     Dose/Rate Route Frequency Ordered Stop   04/27/19 2100  vancomycin (VANCOCIN) IVPB 1000 mg/200 mL premix  Status:  Discontinued     1,000 mg 200 mL/hr over 60 Minutes Intravenous Every 48 hours 04/25/19 2129 04/26/19 1808   04/26/19 0200  piperacillin-tazobactam (ZOSYN) IVPB 2.25 g     2.25 g 100 mL/hr over 30 Minutes Intravenous Every 6 hours 04/25/19 2129     04/25/19 2045  vancomycin (VANCOCIN) IVPB 1000 mg/200 mL premix     1,000 mg 200 mL/hr over 60 Minutes Intravenous  Once 04/25/19 2040 04/26/19 0028   04/25/19 1915  piperacillin-tazobactam (ZOSYN) IVPB 3.375 g     3.375 g 100 mL/hr over 30 Minutes Intravenous  Once 04/25/19 1911 04/25/19 2102           Subjective   Paged regarding Hb 6.4.   Patient seen at bedside in no distress. Patient currently denies any complaints other than further BMs, does not know if bloody. He is comfortable.   Objective   Vitals:   04/29/19 1330 04/29/19 1651 04/29/19 2104 04/30/19 0539  BP: (!) 108/49 135/63 (!) 143/52 (!) 135/50  Pulse: 71 62 60 (!) 59  Resp: 20 18 17 20   Temp: 97.6 F (36.4 C)  (!) 97.5 F (36.4 C) 97.8 F (36.6 C)  TempSrc: Oral  Oral Oral  SpO2: 100% 100% 98% 99%  Weight:   72.2 kg   Height:        Intake/Output Summary (Last 24 hours) at 04/30/2019 0856 Last data filed at 04/30/2019 0630 Gross per 24 hour  Intake 2239.04 ml  Output 2526 ml  Net -286.96 ml   Filed Weights   04/27/19 1337 04/28/19 0500 04/29/19 2104  Weight: 70.2 kg 72.3 kg 72.2 kg    Examination:  Physical Exam Vitals and nursing note reviewed.  Constitutional:      Appearance: He is ill-appearing.  HENT:     Head: Normocephalic.  Eyes:     Conjunctiva/sclera: Conjunctivae normal.  Cardiovascular:     Rate and Rhythm: Normal rate and regular rhythm.     Heart sounds: Murmur present.  Pulmonary:     Effort: No respiratory distress.     Breath sounds: No wheezing.   Abdominal:     General: Abdomen is flat. There is no distension.  Musculoskeletal:     Comments: Bilateral upper extremity edema  Skin:    Coloration: Skin is pale.  Neurological:     Mental Status: He is alert.  Psychiatric:        Mood and Affect: Mood normal.        Behavior: Behavior normal.     Data Reviewed: I have personally reviewed following labs and imaging studies  CBC: Recent Labs  Lab 04/25/19 1749 04/26/19 0425 04/27/19 0419 04/27/19 0419 04/28/19 0449 04/28/19 0449 04/29/19 0521 04/29/19 1135 04/29/19 1801 04/30/19 0028 04/30/19 0802  WBC 15.2*   < > 10.5  --  7.1  --  9.2 7.4  --  6.2  --   NEUTROABS 13.9*  --  8.6*  --   --   --   --   --   --   --   --   HGB 11.5*   < >  9.6*   < > 9.8*   < > 8.3* 7.7* 7.5* 7.0* 6.4*  HCT 36.0*   < > 30.4*   < > 32.1*   < > 25.5* 24.3* 25.1* 23.1* 21.3*  MCV 102.9*   < > 101.7*  --  105.2*  --  100.4* 99.6  --  105.5*  --   PLT 231   < > 157  --  178  --  183 172  --  166  --    < > = values in this interval not displayed.   Basic Metabolic Panel: Recent Labs  Lab 04/26/19 0425 04/27/19 0419 04/28/19 0449 04/29/19 0521 04/30/19 0028  NA 138 138 137 139 142  K 4.9 4.6 5.5* 5.5* 4.9  CL 106 107 109 112* 116*  CO2 22 21* 20* 19* 17*  GLUCOSE 108* 92 168* 90 117*  BUN 44* 43* 43* 49* 48*  CREATININE 2.04* 2.25* 2.42* 2.72* 3.06*  CALCIUM 7.4* 7.7* 7.5* 7.4* 7.3*  MG  --   --   --   --  2.2   GFR: Estimated Creatinine Clearance: 13.5 mL/min (A) (by C-G formula based on SCr of 3.06 mg/dL (H)). Liver Function Tests: Recent Labs  Lab 04/26/19 0425 04/27/19 0419 04/28/19 0449 04/29/19 0521 04/30/19 0028  AST 501* 231* 142* 151* 96*  ALT 292* 196* 162* 144* 117*  ALKPHOS 157* 137* 152* 142* 130*  BILITOT 3.4* 3.3* 3.0* 2.3* 0.8  PROT 6.0* 5.9* 6.3* 5.5* 4.8*  ALBUMIN 2.7* 2.6* 2.5* 2.2* 2.1*   Recent Labs  Lab 04/25/19 1749  LIPASE 33   No results for input(s): AMMONIA in the last 168  hours. Coagulation Profile: No results for input(s): INR, PROTIME in the last 168 hours. Cardiac Enzymes: Recent Labs  Lab 04/25/19 2225  CKTOTAL 41*  CKMB 1.9   BNP (last 3 results) No results for input(s): PROBNP in the last 8760 hours. HbA1C: No results for input(s): HGBA1C in the last 72 hours. CBG: No results for input(s): GLUCAP in the last 168 hours. Lipid Profile: No results for input(s): CHOL, HDL, LDLCALC, TRIG, CHOLHDL, LDLDIRECT in the last 72 hours. Thyroid Function Tests: No results for input(s): TSH, T4TOTAL, FREET4, T3FREE, THYROIDAB in the last 72 hours. Anemia Panel: No results for input(s): VITAMINB12, FOLATE, FERRITIN, TIBC, IRON, RETICCTPCT in the last 72 hours. Sepsis Labs: Recent Labs  Lab 04/25/19 2033 04/25/19 2225  LATICACIDVEN 1.6 2.2*    Recent Results (from the past 240 hour(s))  Urine Culture     Status: Abnormal   Collection Time: 04/25/19  5:47 PM   Specimen: Urine, Random  Result Value Ref Range Status   Specimen Description   Final    URINE, RANDOM Performed at [facility] [address], [place], [place] [address]    Special Requests   Final    RIGHT NEPHROSTMY Performed at [facility] [address], [place], [place] [address]    Culture MULTIPLE SPECIES PRESENT, SUGGEST RECOLLECTION (A)  Final   Report Status 04/26/2019 FINAL  Final  Urine culture     Status: Abnormal   Collection Time: 04/25/19  5:49 PM   Specimen: Urine, Random  Result Value Ref Range Status   Specimen Description   Final    URINE, RANDOM Performed at [facility] [address] [place], [place] [address]    Special Requests   Final    NONE Performed at [facility], [place]  [address], [place], [place] [address]    Culture MULTIPLE SPECIES PRESENT, SUGGEST RECOLLECTION (A)  Final   Report Status 04/26/2019 FINAL  Final  Respiratory Panel by RT PCR (Flu A&B, Covid) -  Nasopharyngeal Swab     Status: None   Collection Time: 04/25/19  6:53 PM   Specimen: Nasopharyngeal Swab  Result Value Ref Range Status   SARS Coronavirus 2 by RT PCR NEGATIVE NEGATIVE Final    Comment: (NOTE) SARS-CoV-2 target nucleic acids are NOT DETECTED. The SARS-CoV-2 RNA is generally detectable in upper respiratoy specimens during the acute phase of infection. The lowest concentration of SARS-CoV-2 viral copies this assay can detect is 131 copies/mL. A negative result does not preclude SARS-Cov-2 infection and should not be used as the sole basis for treatment or other patient management decisions. A negative result may occur with  improper specimen collection/handling, submission of specimen other than nasopharyngeal swab, presence of viral mutation(s) within the areas targeted by this assay, and inadequate number of viral copies (<131 copies/mL). A negative result must be combined with clinical observations, patient history, and epidemiological information. The expected result is Negative. Fact Sheet for Patients:  PinkCheek.be Fact Sheet for Healthcare Providers:  GravelBags.it This test is not yet ap proved or cleared by the [place] FDA and  has been authorized for detection and/or diagnosis of SARS-CoV-2 by FDA under an Emergency Use Authorization (EUA). This EUA will remain  in effect (meaning this test can be used) for the duration of the COVID-19 declaration under Section 564(b)(1) of the Act, 21 U.S.C. section 360bbb-3(b)(1), unless the authorization is terminated or revoked sooner.    Influenza A by PCR NEGATIVE NEGATIVE Final   Influenza B by PCR NEGATIVE NEGATIVE Final    Comment: (NOTE) The Xpert Xpress SARS-CoV-2/FLU/RSV assay is intended as an aid in  the diagnosis of influenza from Nasopharyngeal swab specimens and  should not be used as a sole basis for treatment. Nasal washings and  aspirates  are unacceptable for Xpert Xpress SARS-CoV-2/FLU/RSV  testing. Fact Sheet for Patients: PinkCheek.be Fact Sheet for Healthcare Providers: GravelBags.it This test is not yet approved or cleared by the [place] FDA and  has been authorized for detection and/or diagnosis of SARS-CoV-2 by  FDA under an Emergency Use Authorization (EUA). This EUA will remain  in effect (meaning this test can be used) for the duration of the  Covid-19 declaration under Section 564(b)(1) of the Act, 21  U.S.C. section 360bbb-3(b)(1), unless the authorization is  terminated or revoked. Performed at [facility], [place] [address] [place], [place] [address]   Blood culture (routine x 2)     Status: Abnormal   Collection Time: 04/25/19  8:29 PM   Specimen: BLOOD  Result Value Ref Range Status   Specimen Description BLOOD LEFT ANTECUBITAL  Final   Special Requests   Final    BOTTLES DRAWN AEROBIC AND ANAEROBIC Blood Culture adequate volume Performed at [facility] [address] [place], [place] [address]    Culture  Setup Time   Final    GRAM POSITIVE COCCI IN BOTH AEROBIC AND ANAEROBIC BOTTLES CRITICAL RESULT CALLED TO, READ BACK BY AND VERIFIED WITH: JUSTIN LEGGE @1455  04/26/19 AKT    Culture (A)  Final    ENTEROCOCCUS CASSELIFLAVUS SUSCEPTIBILITIES PERFORMED ON PREVIOUS CULTURE WITHIN THE LAST 5 DAYS. Performed at [facility], [place] [address], [place], [place] [address]    Report Status 04/28/2019 FINAL  Final  Blood culture (routine x 2)     Status: Abnormal   Collection Time: 04/25/19  8:33 PM   Specimen: BLOOD RIGHT FOREARM  Result Value Ref Range Status   Specimen Description BLOOD RIGHT FOREARM  Final   Special Requests   Final    BOTTLES DRAWN AEROBIC AND ANAEROBIC Blood Culture adequate volume Performed at [facility] [address], [place], [place] [address]     Culture  Setup Time   Final    GRAM POSITIVE COCCI IN BOTH AEROBIC AND ANAEROBIC BOTTLES CRITICAL VALUE NOTED.  VALUE IS CONSISTENT WITH PREVIOUSLY REPORTED AND CALLED VALUE. AKT Organism ID to follow CRITICAL RESULT CALLED TO, READ BACK BY AND VERIFIED WITH: Christean Grief [facility] 04/26/19 1750 JDW Performed at [facility], [place] [address], [place], [place] [address]    Culture ENTEROCOCCUS CASSELIFLAVUS (A)  Final   Report Status 04/28/2019 FINAL  Final   Organism ID, Bacteria ENTEROCOCCUS CASSELIFLAVUS  Final      Susceptibility   Enterococcus casseliflavus - MIC*    AMPICILLIN <=2 SENSITIVE Sensitive     VANCOMYCIN RESISTANT Resistant     GENTAMICIN SYNERGY SENSITIVE Sensitive     LINEZOLID 2 SENSITIVE Sensitive     * ENTEROCOCCUS CASSELIFLAVUS  Blood Culture ID Panel (Reflexed)     Status: Abnormal   Collection Time: 04/25/19  8:33 PM  Result Value Ref Range Status   Enterococcus species DETECTED (A) NOT DETECTED Final    Comment: CRITICAL RESULT CALLED TO, READ BACK BY AND VERIFIED WITH: J LEGGE PHARMD 04/26/19 1750 JDW    Vancomycin resistance NOT DETECTED NOT DETECTED Final   Listeria monocytogenes NOT DETECTED NOT DETECTED Final   Staphylococcus species NOT DETECTED NOT DETECTED Final   Staphylococcus aureus (BCID) NOT DETECTED NOT DETECTED Final   Streptococcus species NOT DETECTED NOT DETECTED Final   Streptococcus agalactiae NOT DETECTED NOT DETECTED Final   Streptococcus pneumoniae NOT DETECTED NOT DETECTED Final   Streptococcus pyogenes NOT DETECTED NOT DETECTED Final   Acinetobacter baumannii NOT DETECTED NOT DETECTED Final   Enterobacteriaceae species NOT DETECTED NOT DETECTED Final   Enterobacter cloacae complex NOT DETECTED NOT DETECTED Final   Escherichia coli NOT DETECTED NOT DETECTED Final   Klebsiella oxytoca NOT DETECTED NOT DETECTED Final   Klebsiella pneumoniae NOT DETECTED NOT DETECTED Final   Proteus species NOT DETECTED NOT DETECTED Final   Serratia  marcescens NOT DETECTED NOT DETECTED Final   Haemophilus influenzae NOT DETECTED NOT DETECTED Final   Neisseria meningitidis NOT DETECTED NOT DETECTED Final   Pseudomonas aeruginosa NOT DETECTED NOT DETECTED Final   Candida albicans NOT DETECTED NOT DETECTED Final   Candida glabrata NOT DETECTED NOT DETECTED Final   Candida krusei NOT DETECTED NOT DETECTED Final   Candida parapsilosis NOT DETECTED NOT DETECTED Final   Candida tropicalis NOT DETECTED NOT DETECTED Final    Comment: Performed at [facility], [place]. [address], [place], [place] [address]  Culture, blood (routine x 2)     Status: None (Preliminary result)   Collection Time: 04/27/19 10:14 AM   Specimen: BLOOD LEFT HAND  Result Value Ref Range Status   Specimen Description   Final    BLOOD LEFT HAND Performed at [facility] [address] [place], [place] [address]    Special Requests   Final    BOTTLES DRAWN AEROBIC ONLY Blood Culture adequate volume Performed at [facility] [address], [place], [place] [address]  Culture   Final    NO GROWTH 2 DAYS Performed at [facility], [place] [address], [place], [place] [address]    Report Status PENDING  Incomplete  Culture, blood (routine x 2)     Status: None (Preliminary result)   Collection Time: 04/27/19 10:14 AM   Specimen: BLOOD  Result Value Ref Range Status   Specimen Description   Final    BLOOD LEFT ARM Performed at [facility] [address] [place], [place] [address]    Special Requests   Final    BOTTLES DRAWN AEROBIC ONLY Blood Culture adequate volume Performed at [facility] [address], [place], [place] [address]    Culture   Final    NO GROWTH 2 DAYS Performed at [facility] [address] [place], [place] [address]    Report Status PENDING  Incomplete         Radiology Studies: ECHOCARDIOGRAM COMPLETE  Result Date: 04/28/2019    ECHOCARDIOGRAM REPORT   Patient Name:   [name] [name] Date of Exam: 04/28/2019 Medical Rec #:  [id number]          Height:       67.0 in Accession #:    [id number]         Weight:       159.4 lb Date of Birth:  [birth year]           BSA:          1.84 m Patient Age:    89 years           BP:           155/75 mmHg Patient Gender: M                  HR:           54 bpm. Exam Location:  Inpatient Procedure: 2D Echo, Cardiac Doppler and Color Doppler                                 MODIFIED REPORT:     This report was modified by Cherlynn Kaiser MD on 04/28/2019 due to added                              comparison statement.  Indications:     Bacteremia.  History:         Patient has prior history of Echocardiogram examinations, most                  recent 12/10/2018. CHF, Abnormal ECG and Pacemaker; Risk                  Factors:Former Smoker and Hypertension. Cancer. Aortic                  stenosis.  Sonographer:     Roseanna Rainbow RDCS Referring Phys:  [employer] Diagnosing Phys: Cherlynn Kaiser MD IMPRESSIONS  1. Left ventricular ejection fraction, by visual estimation, is 25 to 30%. The left ventricle has severely decreased function. There is moderately increased left ventricular hypertrophy.  2. Elevated left ventricular end-diastolic pressure.  3. Global right ventricle has mildly reduced systolic function.The right ventricular size is normal. No increase in right ventricular wall thickness.  4. Moderately elevated pulmonary artery systolic pressure, RVSP 47 mmHg.  5. Left atrial size was moderately dilated.  6. The aortic valve is abnormal. Aortic valve regurgitation is mild to moderate. Severe, low flow low gradient aortic valve stenosis. Systolic mean gradient is approximately 38 mmHg. Dimensionless index 0.21. There is severe calcifcation of the aortic valve. Severely restricted leaflet motion.  7. Mild mitral annular calcification.  8. The mitral valve is abnormal. Mild to moderate mitral valve  regurgitation. No evidence of mitral stenosis.  9. The tricuspid valve is normal in structure. Tricuspid valve regurgitation is moderate. 10. There is mild dilatation of the ascending aorta measuring 40 mm. 11. The inferior vena cava is normal in size with <50% respiratory variability, suggesting right atrial pressure of 8 mmHg. 12. Trivial pericardial effusion is present. Moderate pleural effusion. 13. The left ventricle demonstrates global hypokinesis. 14. Side by side comparison of images performed to study from 12/10/2018. LV function has not significantly changed. Aortic stenosis is now severe. AR, MR, and TR appear to have increased. FINDINGS  Left Ventricle: Left ventricular ejection fraction, by visual estimation, is 25 to 30%. The left ventricle has severely decreased function. The left ventricle demonstrates global hypokinesis. There is moderately increased left ventricular hypertrophy. Left ventricular diastolic parameters are indeterminate. Elevated left ventricular end-diastolic pressure. Right Ventricle: The right ventricular size is normal. No increase in right ventricular wall thickness. Global RV systolic function is has mildly reduced systolic function. The tricuspid regurgitant velocity is 3.11 m/s, and with an assumed right atrial pressure of 8 mmHg, the estimated right ventricular systolic pressure is moderately elevated at 46.6 mmHg. Left Atrium: Left atrial size was moderately dilated. Right Atrium: Right atrial size was normal in size Pericardium: Trivial pericardial effusion is present. There is a moderate pleural effusion in either the left or right lateral region. Mitral Valve: The mitral valve is abnormal. There is mild thickening of the mitral valve leaflet(s). Mild mitral annular calcification. Mild to moderate mitral valve regurgitation. No evidence of mitral valve stenosis by observation. Tricuspid Valve: The tricuspid valve is normal in structure. Tricuspid valve regurgitation is  moderate. Aortic Valve: The aortic valve is abnormal. Aortic valve regurgitation is mild to moderate. Aortic regurgitation PHT measures 608 msec. Severe aortic stenosis is present. There is severe calcifcation of the aortic valve. Aortic valve mean gradient measures 37.5 mmHg. Aortic valve peak gradient measures 62.3 mmHg. Aortic valve area, by VTI measures 0.61 cm. Pulmonic Valve: The pulmonic valve was normal in structure. Pulmonic valve regurgitation is trivial. Pulmonic regurgitation is trivial. Aorta: Aortic dilatation noted. There is mild dilatation of the ascending aorta measuring 40 mm. Venous: The inferior vena cava is normal in size with less than 50% respiratory variability, suggesting right atrial pressure of 8 mmHg. IAS/Shunts: The interatrial septum was not well visualized. Additional Comments: A pacer wire is visualized in the right atrium and right ventricle.  LEFT VENTRICLE PLAX 2D LVIDd:         4.71 cm       Diastology LVIDs:         4.14 cm       LV e' medial:   2.81 cm/s LV PW:         1.49 cm       LV E/e' medial: 38.0 LV IVS:        1.38 cm LVOT diam:     2.10 cm LV SV:         27 ml LV SV Index:   14.49 LVOT Area:     3.46  cm  LV Volumes (MOD) LV area d, A2C:    37.10 cm LV area d, A4C:    38.40 cm LV area s, A2C:    30.60 cm LV area s, A4C:    33.80 cm LV major d, A2C:   9.25 cm LV major d, A4C:   8.65 cm LV major s, A2C:   8.72 cm LV major s, A4C:   8.42 cm LV vol d, MOD A2C: 122.0 ml LV vol d, MOD A4C: 138.0 ml LV vol s, MOD A2C: 87.7 ml LV vol s, MOD A4C: 109.0 ml LV SV MOD A2C:     34.3 ml LV SV MOD A4C:     138.0 ml LV SV MOD BP:      34.3 ml RIGHT VENTRICLE TAPSE (M-mode): 2.1 cm LEFT ATRIUM             Index       RIGHT ATRIUM           Index LA diam:        3.70 cm 2.01 cm/m  RA Area:     17.60 cm LA Vol (A2C):   86.7 ml 47.21 ml/m RA Volume:   48.00 ml  26.14 ml/m LA Vol (A4C):   71.4 ml 38.88 ml/m LA Biplane Vol: 85.6 ml 46.62 ml/m  AORTIC VALVE                     PULMONIC VALVE AV Area (Vmax):    0.62 cm     PR End Diast Vel: 1.80 msec AV Area (Vmean):   0.55 cm AV Area (VTI):     0.61 cm AV Vmax:           394.60 cm/s AV Vmean:          283.400 cm/s AV VTI:            1.084 m AV Peak Grad:      62.3 mmHg AV Mean Grad:      37.5 mmHg LVOT Vmax:         70.70 cm/s LVOT Vmean:        45.400 cm/s LVOT VTI:          0.191 m LVOT/AV VTI ratio: 0.18 AI PHT:            608 msec  AORTA Ao Root diam: 3.00 cm Ao Asc diam:  4.00 cm MITRAL VALVE                         TRICUSPID VALVE MV Area (PHT): 2.87 cm              TR Peak grad:   38.6 mmHg MV PHT:        76.56 msec            TR Vmax:        326.00 cm/s MV Decel Time: 264 msec MR Peak grad:    132.7 mmHg          SHUNTS MR Mean grad:    78.0 mmHg           Systemic VTI:  0.19 m MR Vmax:         576.00 cm/s         Systemic Diam: 2.10 cm MR Vmean:        405.0 cm/s MR PISA:         1.01 cm MR PISA Eff ROA:  6 mm MR PISA Radius:  0.40 cm MV E velocity: 106.67 cm/s 103 cm/s MV A velocity: 100.65 cm/s 70.3 cm/s MV E/A ratio:  1.06        1.5  Cherlynn Kaiser MD Electronically signed by Cherlynn Kaiser MD Signature Date/Time: 04/28/2019/3:22:46 PM    Final (Updated)         Scheduled Meds: . sodium chloride   Intravenous Once  . cholecalciferol  1,000 Units Oral Daily  . docusate sodium  300 mg Oral Daily  . febuxostat  40 mg Oral Daily  . furosemide  20 mg Intravenous Once  . levothyroxine  25 mcg Oral Q0600  . metoprolol succinate  12.5 mg Oral Daily  . multivitamin  1 tablet Oral Daily  . pantoprazole (PROTONIX) IV  40 mg Intravenous QHS   Continuous Infusions: . sodium chloride Stopped (04/29/19 1820)  . sodium chloride 75 mL/hr at 04/30/19 0759  . piperacillin-tazobactam (ZOSYN)  IV 2.25 g (04/29/19 2112)     LOS: 5 days    Time spent: 25 minutes with over 50% of the time coordinating the patient's care    Harold Hedge, DO [facility] Pager [phone]  If 7PM-7AM, please contact  night-coverage [email or web address] Password [facility] 04/30/2019, 8:56 AM

## 2019-04-30 NOTE — Progress Notes (Signed)
CRITICAL VALUE ALERT  Critical Value:  Hgb 6.4  Date & Time Notied:  04/30/2019 0802  Provider Notified: Dr. Neysa Bonito  Orders Received/Actions taken:

## 2019-04-30 NOTE — Progress Notes (Signed)
PHARMACY CONSULT NOTE FOR:  OUTPATIENT  PARENTERAL ANTIBIOTIC THERAPY (OPAT)  Indication: enterococcal bacteremia; presumptive endocarditis Regimen: ampicillin 2gm IV q12h and ceftriaxone 2gm q12h End date: 05/23/2019   IV antibiotic discharge orders are pended. To discharging provider:  please sign these orders via discharge navigator,  Select New Orders & click on the button choice - Manage This Unsigned Work.     Thank you for allowing pharmacy to be a part of this patient's care.  Lynelle Doctor 04/30/2019, 11:38 AM

## 2019-04-30 NOTE — Progress Notes (Signed)
OT Cancellation Note  Patient Details Name: [name] [name] MRN: [id number] DOB: March 06, 1922   Cancelled Treatment:     Patient hemoglobin at critical level, will re-attempt once medically stable and as schedule allows.  [place] OT office: [place] 04/30/2019, 8:58 AM

## 2019-04-30 NOTE — Progress Notes (Signed)
Patient ID: [name] [name], male   DOB: 1922-01-12, 86 y.o.   MRN: [id number]         [facility]  Date of Admission:  04/25/2019           Day 5 piperacillin tazobactam ASSESSMENT: He remains afebrile on therapy for cholangitis complicated by enterococcal bacteremia.  I appreciate [name] [name] evaluation and recommendations.  [name] [name] and his daughter would like to avoid him undergoing a TEE.  I suggested that we treat presumptively for endocarditis with IV ampicillin and ceftriaxone.  They are in agreement with that plan.  Repeat blood cultures are negative so I will go ahead and order PICC placement.  PLAN: 1. Change piperacillin tazobactam to IV ampicillin (renally dosed) and ceftriaxone 2. PICC placement 3. Please call [name] [name] [phone]) for any infectious disease questions this weekend  Diagnosis: Bacteremia  Culture Result: Enterococcus  No Known Allergies  OPAT Orders Discharge antibiotics: Per pharmacy protocol ampicillin and ceftriaxone  Duration: 4 weeks minimum End Date: 05/23/2019  [facility] Care Per Protocol:  Home health RN for IV administration and teaching; PICC line care and labs.    Labs weekly while on IV antibiotics: __ CBC with differential __ BMP __ CMP __ CRP __ ESR __ Vancomycin trough __ CK  __ Please pull PIC at completion of IV antibiotics __ Please leave PIC in place until doctor has seen patient or been notified  Fax weekly labs to [phone]  Clinic Follow Up Appt: 05/20/2019  Principal Problem:   Enterococcal bacteremia Active Problems:   Acute lower UTI   Sepsis ([place])   Choledocholithiasis   Hypertension   Pacemaker   Aortic stenosis   CKD (chronic kidney disease), stage IV (HCC)   Bladder cancer ([place])   Acute on chronic diastolic CHF (congestive heart failure) (HCC)   Abnormal liver function   Status post ileal conduit (HCC)   Macrocytic anemia   HOH (hard of hearing)   History of prostate cancer   Scheduled Meds: . sodium chloride   Intravenous Once  . cholecalciferol  1,000 Units Oral Daily  . docusate sodium  300 mg Oral Daily  . febuxostat  40 mg Oral Daily  . furosemide  20 mg Intravenous Once  . levothyroxine  25 mcg Oral Q0600  . metoprolol succinate  12.5 mg Oral Daily  . multivitamin  1 tablet Oral Daily  . pantoprazole (PROTONIX) IV  40 mg Intravenous QHS   Continuous Infusions: . sodium chloride Stopped (04/29/19 1820)  . sodium chloride 75 mL/hr at 04/30/19 0759  . piperacillin-tazobactam (ZOSYN)  IV 2.25 g (04/30/19 1009)   PRN Meds:.sodium chloride, colchicine, ondansetron (ZOFRAN) IV, polyethylene glycol, polyvinyl alcohol, traMADol   SUBJECTIVE: [name] [name] is feeling better today.  Review of Systems: Review of Systems  Constitutional: Negative for fever.  Respiratory: Negative for cough and shortness of breath.   Cardiovascular: Negative for chest pain.  Gastrointestinal: Positive for blood in stool. Negative for abdominal pain, diarrhea, nausea and vomiting.  Musculoskeletal: Negative for back pain.    No Known Allergies  OBJECTIVE: Vitals:   04/29/19 2104 04/30/19 0539 04/30/19 0940 04/30/19 1012  BP: (!) 143/52 (!) 135/50 (!) 130/46   Pulse: 60 (!) 59 (!) 59   Resp: 17 20    Temp: (!) 97.5 F (36.4 C) 97.8 F (36.6 C) (!) 97.5 F (36.4 C)   TempSrc: Oral Oral Oral   SpO2: 98% 99% 96% 96%  Weight:  72.2 kg     Height:       Body mass index is 24.93 kg/m.  Physical Exam Constitutional:      Comments: He is resting comfortably in bed.  His daughter is present.  Cardiovascular:     Rate and Rhythm: Normal rate and regular rhythm.     Heart sounds: Murmur present.     Comments: Pacemaker site is okay. Pulmonary:     Effort: Pulmonary effort is normal.     Breath sounds: Normal breath sounds.  Abdominal:     Palpations: Abdomen is soft.     Tenderness: There is no abdominal tenderness.     Lab  Results Lab Results  Component Value Date   WBC 6.2 04/30/2019   HGB 6.4 (LL) 04/30/2019   HCT 21.3 (L) 04/30/2019   MCV 105.5 (H) 04/30/2019   PLT 166 04/30/2019    Lab Results  Component Value Date   CREATININE 3.06 (H) 04/30/2019   BUN 48 (H) 04/30/2019   NA 142 04/30/2019   K 4.9 04/30/2019   CL 116 (H) 04/30/2019   CO2 17 (L) 04/30/2019    Lab Results  Component Value Date   ALT 117 (H) 04/30/2019   AST 96 (H) 04/30/2019   ALKPHOS 130 (H) 04/30/2019   BILITOT 0.8 04/30/2019     Microbiology: Recent Results (from the past 240 hour(s))  Urine Culture     Status: Abnormal   Collection Time: 04/25/19  5:47 PM   Specimen: Urine, Random  Result Value Ref Range Status   Specimen Description   Final    URINE, RANDOM Performed at [facility], [place] [address] [place], [place] [address]    Special Requests   Final    RIGHT NEPHROSTMY Performed at [facility] [address] [place], [place] [address]    Culture MULTIPLE SPECIES PRESENT, SUGGEST RECOLLECTION (A)  Final   Report Status 04/26/2019 FINAL  Final  Urine culture     Status: Abnormal   Collection Time: 04/25/19  5:49 PM   Specimen: Urine, Random  Result Value Ref Range Status   Specimen Description   Final    URINE, RANDOM Performed at [facility] [address] [place], [place] [address]    Special Requests   Final    NONE Performed at [facility], [place] [address], [place], [place] [address]    Culture MULTIPLE SPECIES PRESENT, SUGGEST RECOLLECTION (A)  Final   Report Status 04/26/2019 FINAL  Final  Respiratory Panel by RT PCR (Flu A&B, Covid) - Nasopharyngeal Swab     Status: None   Collection Time: 04/25/19  6:53 PM   Specimen: Nasopharyngeal Swab  Result Value Ref Range Status   SARS Coronavirus 2 by RT PCR NEGATIVE NEGATIVE Final    Comment: (NOTE) SARS-CoV-2 target nucleic acids are NOT DETECTED. The SARS-CoV-2 RNA  is generally detectable in upper respiratoy specimens during the acute phase of infection. The lowest concentration of SARS-CoV-2 viral copies this assay can detect is 131 copies/mL. A negative result does not preclude SARS-Cov-2 infection and should not be used as the sole basis for treatment or other patient management decisions. A negative result may occur with  improper specimen collection/handling, submission of specimen other than nasopharyngeal swab, presence of viral mutation(s) within the areas targeted by this assay, and inadequate number of viral copies (<131 copies/mL). A negative result must be combined with clinical observations, patient history, and epidemiological information.  The expected result is Negative. Fact Sheet for Patients:  PinkCheek.be Fact Sheet for Healthcare Providers:  GravelBags.it This test is not yet ap proved or cleared by the [place] FDA and  has been authorized for detection and/or diagnosis of SARS-CoV-2 by FDA under an Emergency Use Authorization (EUA). This EUA will remain  in effect (meaning this test can be used) for the duration of the COVID-19 declaration under Section 564(b)(1) of the Act, 21 U.S.C. section 360bbb-3(b)(1), unless the authorization is terminated or revoked sooner.    Influenza A by PCR NEGATIVE NEGATIVE Final   Influenza B by PCR NEGATIVE NEGATIVE Final    Comment: (NOTE) The Xpert Xpress SARS-CoV-2/FLU/RSV assay is intended as an aid in  the diagnosis of influenza from Nasopharyngeal swab specimens and  should not be used as a sole basis for treatment. Nasal washings and  aspirates are unacceptable for Xpert Xpress SARS-CoV-2/FLU/RSV  testing. Fact Sheet for Patients: PinkCheek.be Fact Sheet for Healthcare Providers: GravelBags.it This test is not yet approved or cleared by the [place] FDA and   has been authorized for detection and/or diagnosis of SARS-CoV-2 by  FDA under an Emergency Use Authorization (EUA). This EUA will remain  in effect (meaning this test can be used) for the duration of the  Covid-19 declaration under Section 564(b)(1) of the Act, 21  U.S.C. section 360bbb-3(b)(1), unless the authorization is  terminated or revoked. Performed at [facility], [place] [address] [place], [place] [address]   Blood culture (routine x 2)     Status: Abnormal   Collection Time: 04/25/19  8:29 PM   Specimen: BLOOD  Result Value Ref Range Status   Specimen Description BLOOD LEFT ANTECUBITAL  Final   Special Requests   Final    BOTTLES DRAWN AEROBIC AND ANAEROBIC Blood Culture adequate volume Performed at [facility] [address], [place], [place] [address]    Culture  Setup Time   Final    GRAM POSITIVE COCCI IN BOTH AEROBIC AND ANAEROBIC BOTTLES CRITICAL RESULT CALLED TO, READ BACK BY AND VERIFIED WITH: JUSTIN LEGGE '@1455'$  04/26/19 AKT    Culture (A)  Final    ENTEROCOCCUS CASSELIFLAVUS SUSCEPTIBILITIES PERFORMED ON PREVIOUS CULTURE WITHIN THE LAST 5 DAYS. Performed at [facility], [place] [address], [place], [place] [address]    Report Status 04/28/2019 FINAL  Final  Blood culture (routine x 2)     Status: Abnormal   Collection Time: 04/25/19  8:33 PM   Specimen: BLOOD RIGHT FOREARM  Result Value Ref Range Status   Specimen Description BLOOD RIGHT FOREARM  Final   Special Requests   Final    BOTTLES DRAWN AEROBIC AND ANAEROBIC Blood Culture adequate volume Performed at [facility] [address], [place], [place] [address]    Culture  Setup Time   Final    GRAM POSITIVE COCCI IN BOTH AEROBIC AND ANAEROBIC BOTTLES CRITICAL VALUE NOTED.  VALUE IS CONSISTENT WITH PREVIOUSLY REPORTED AND CALLED VALUE. AKT Organism ID to follow CRITICAL RESULT CALLED TO, READ BACK BY AND VERIFIED WITH: Christean Grief [facility] 04/26/19  1750 JDW Performed at [facility], [place] [address] [place], [place] [address]    Culture ENTEROCOCCUS CASSELIFLAVUS (A)  Final   Report Status 04/28/2019 FINAL  Final   Organism ID, Bacteria ENTEROCOCCUS CASSELIFLAVUS  Final      Susceptibility   Enterococcus casseliflavus - MIC*    AMPICILLIN <=2 SENSITIVE Sensitive  VANCOMYCIN RESISTANT Resistant     GENTAMICIN SYNERGY SENSITIVE Sensitive     LINEZOLID 2 SENSITIVE Sensitive     * ENTEROCOCCUS CASSELIFLAVUS  Blood Culture ID Panel (Reflexed)     Status: Abnormal   Collection Time: 04/25/19  8:33 PM  Result Value Ref Range Status   Enterococcus species DETECTED (A) NOT DETECTED Final    Comment: CRITICAL RESULT CALLED TO, READ BACK BY AND VERIFIED WITH: J LEGGE PHARMD 04/26/19 1750 JDW    Vancomycin resistance NOT DETECTED NOT DETECTED Final   Listeria monocytogenes NOT DETECTED NOT DETECTED Final   Staphylococcus species NOT DETECTED NOT DETECTED Final   Staphylococcus aureus (BCID) NOT DETECTED NOT DETECTED Final   Streptococcus species NOT DETECTED NOT DETECTED Final   Streptococcus agalactiae NOT DETECTED NOT DETECTED Final   Streptococcus pneumoniae NOT DETECTED NOT DETECTED Final   Streptococcus pyogenes NOT DETECTED NOT DETECTED Final   Acinetobacter baumannii NOT DETECTED NOT DETECTED Final   Enterobacteriaceae species NOT DETECTED NOT DETECTED Final   Enterobacter cloacae complex NOT DETECTED NOT DETECTED Final   Escherichia coli NOT DETECTED NOT DETECTED Final   Klebsiella oxytoca NOT DETECTED NOT DETECTED Final   Klebsiella pneumoniae NOT DETECTED NOT DETECTED Final   Proteus species NOT DETECTED NOT DETECTED Final   Serratia marcescens NOT DETECTED NOT DETECTED Final   Haemophilus influenzae NOT DETECTED NOT DETECTED Final   Neisseria meningitidis NOT DETECTED NOT DETECTED Final   Pseudomonas aeruginosa NOT DETECTED NOT DETECTED Final   Candida albicans NOT DETECTED NOT DETECTED Final   Candida glabrata NOT  DETECTED NOT DETECTED Final   Candida krusei NOT DETECTED NOT DETECTED Final   Candida parapsilosis NOT DETECTED NOT DETECTED Final   Candida tropicalis NOT DETECTED NOT DETECTED Final    Comment: Performed at [facility], [address], [place], [place] [address]  Culture, blood (routine x 2)     Status: None (Preliminary result)   Collection Time: 04/27/19 10:14 AM   Specimen: BLOOD LEFT HAND  Result Value Ref Range Status   Specimen Description   Final    BLOOD LEFT HAND Performed at [facility] [address], [place], [place] [address]    Special Requests   Final    BOTTLES DRAWN AEROBIC ONLY Blood Culture adequate volume Performed at [facility] [address], [place], [place] [address]    Culture   Final    NO GROWTH 2 DAYS Performed at [facility] [address] [place], [place] [address]    Report Status PENDING  Incomplete  Culture, blood (routine x 2)     Status: None (Preliminary result)   Collection Time: 04/27/19 10:14 AM   Specimen: BLOOD  Result Value Ref Range Status   Specimen Description   Final    BLOOD LEFT ARM Performed at [facility] [address] [place], [place] [address]    Special Requests   Final    BOTTLES DRAWN AEROBIC ONLY Blood Culture adequate volume Performed at [facility] [address] [place], [place] [address]    Culture   Final    NO GROWTH 2 DAYS Performed at [facility] [address], [place], [place] [address]    Report Status PENDING  Incomplete    Michel Bickers, MD [facility] [phone] pager   [phone] cell 04/30/2019, 11:11 AM

## 2019-04-30 NOTE — Plan of Care (Signed)
  Problem: Clinical Measurements: Goal: Ability to maintain clinical measurements within normal limits will improve Outcome: Not Progressing Goal: Will remain free from infection Outcome: Progressing Goal: Diagnostic test results will improve Outcome: Not Progressing   Problem: Coping: Goal: Level of anxiety will decrease Outcome: Progressing   Problem: Pain Managment: Goal: General experience of comfort will improve Outcome: Completed/Met   Problem: Skin Integrity: Goal: Risk for impaired skin integrity will decrease Outcome: Completed/Met   Problem: Clinical Measurements: Goal: Diagnostic test results will improve Outcome: Progressing Goal: Signs and symptoms of infection will decrease Outcome: Progressing

## 2019-05-01 DIAGNOSIS — I5033 Acute on chronic diastolic (congestive) heart failure: Secondary | ICD-10-CM

## 2019-05-01 LAB — CBC
HCT: 23.7 % — ABNORMAL LOW (ref 39.0–52.0)
Hemoglobin: 7.6 g/dL — ABNORMAL LOW (ref 13.0–17.0)
MCH: 32.5 pg (ref 26.0–34.0)
MCHC: 32.1 g/dL (ref 30.0–36.0)
MCV: 101.3 fL — ABNORMAL HIGH (ref 80.0–100.0)
Platelets: 137 10*3/uL — ABNORMAL LOW (ref 150–400)
RBC: 2.34 MIL/uL — ABNORMAL LOW (ref 4.22–5.81)
RDW: 18.4 % — ABNORMAL HIGH (ref 11.5–15.5)
WBC: 9.5 10*3/uL (ref 4.0–10.5)
nRBC: 0 % (ref 0.0–0.2)

## 2019-05-01 LAB — MAGNESIUM: Magnesium: 2 mg/dL (ref 1.7–2.4)

## 2019-05-01 LAB — BASIC METABOLIC PANEL
Anion gap: 13 (ref 5–15)
BUN: 46 mg/dL — ABNORMAL HIGH (ref 8–23)
CO2: 19 mmol/L — ABNORMAL LOW (ref 22–32)
Calcium: 6.7 mg/dL — ABNORMAL LOW (ref 8.9–10.3)
Chloride: 110 mmol/L (ref 98–111)
Creatinine, Ser: 2.85 mg/dL — ABNORMAL HIGH (ref 0.61–1.24)
GFR calc Af Amer: 21 mL/min — ABNORMAL LOW (ref >60)
GFR calc non Af Amer: 18 mL/min — ABNORMAL LOW (ref >60)
Glucose, Bld: 79 mg/dL (ref 70–99)
Potassium: 4.5 mmol/L (ref 3.5–5.1)
Sodium: 142 mmol/L (ref 135–145)

## 2019-05-01 MED ORDER — SODIUM CHLORIDE 0.9% FLUSH
10.0000 mL | Freq: Two times a day (BID) | INTRAVENOUS | Status: DC
  Administered 2019-05-03 (×2): 10 mL

## 2019-05-01 MED ORDER — FUROSEMIDE 20 MG PO TABS
20.0000 mg | ORAL_TABLET | ORAL | Status: DC
  Administered 2019-05-01: 20 mg via ORAL
  Filled 2019-05-01: qty 1

## 2019-05-01 MED ORDER — SODIUM CHLORIDE 0.9% FLUSH
10.0000 mL | INTRAVENOUS | Status: DC | PRN
  Administered 2019-05-04 – 2019-05-05 (×2): 10 mL

## 2019-05-01 MED ORDER — CHLORHEXIDINE GLUCONATE CLOTH 2 % EX PADS
6.0000 | MEDICATED_PAD | Freq: Every day | CUTANEOUS | Status: DC
  Administered 2019-05-03 – 2019-05-05 (×3): 6 via TOPICAL

## 2019-05-01 NOTE — Progress Notes (Signed)
PROGRESS NOTE    [name] [name]    Code Status: DNR  SJG:[id number] DOB: 08-Nov-1921 DOA: 04/25/2019  PCP: Crist Infante, MD    Hospital Summary  This is a 86 year old male with history of prostate and bladder cancer in 2006 with ileal conduit/urethral stricture, sinus node dysfunction status post PPM dual-chamber, aortic stenosis, gout, DVT who presented to [facility] with fever and left flank pain on 1/31 and shortness of breath, admitted for sepsis and found to have dural coccal bacteremia as well as found to have nonobstructing left renal stone and found to have elevated LFTs, Noncon CT showed CBD stones and intrahepatic and extrahepatic biliary dilation.  GI was consulted and he underwent ERCP with sphincterotomy with Dr. Watt Climes on 2/2.  For bacteremia, ID was consulted and recommended TTE and continue Zosyn.  At family's request, palliative care was consulted for [place] discussion who recommended home-based palliative care at discharge.  2/4 Acute blood loss anemia secondary to GI bleed. GI made aware Cardiology consulted for possible TEE and eval of severe AS  2/5 Hb 6.4. 1 u PRBCs transfused  A & P   Principal Problem:   Enterococcal bacteremia Active Problems:   Hypertension   Pacemaker   Aortic stenosis   CKD (chronic kidney disease), stage IV (HCC)   Acute lower UTI   Sepsis (HCC)   Bladder cancer (HCC)   Acute on chronic diastolic CHF (congestive heart failure) (HCC)   Abnormal liver function   Choledocholithiasis   Status post ileal conduit (HCC)   Macrocytic anemia   HOH (hard of hearing)   History of prostate cancer   1. Choledocholithiasis/cholangitis status post ERCP and sphincterotomy 04/27/2019 with Dr. Watt Climes a. Stable b. GI on board 2. Acute Blood loss Anemia secondary to GI bleed, diverticular source more likely vs less likely sphincterotomy source a. Status post 1 unit PRBC 2/5 b. Hemoglobin stable at 7.6 c. Transfuse for hemoglobin <7.0 d. Continue full  liquid diet e. IV PPI twice daily f. Hold aspirin, Lovenox, continue SCDs g. If rectal bleeding recurs and worsens then check bleeding scan 3. Sepsis on admission, sepsis has resolved a. Suspect multifactorial: UTI unknown organism, Enterococcus bacteremia, cholangitis b. Zosyn-> renally dosed ampicillin and ceftriaxone per ID c. Patient and daughter do not wish to proceed with TEE, will likely need to treat as if he does have endocarditis d. PICC line prior to discharge 4. Enterococcus casseliflavus bacteremia a. Zosyn-> renally dosed ampicillin and ceftriaxone per ID b. TTE without vegetations c. TEE and antibiotics as above 5. Right pleural effusion, stable a. Tolerating room air 6. Severe aortic stenosis a. worsened from previous echo on 01/28/2019 b. Not a candidate for repair c. Palliative care consulted 7. HFrEF a. EF similar to prior, 20 to 25% b. Weight up, 68.9 on admission->7 73.8 c. Continue beta-blocker d. Daily weights e. Continue telemetry f. Restart home Lasix 8. Edematous upper extremities a. Keep arms elevated above heart level 9. Sick sinus syndrome status post PPM 10. Elevated troponin, suspect demand ischemia a. Continue telemetry 11. AKI on CKD 4, suspect multifactorial: cardiorenal, blood loss anemia a. Creatinine 2.4-> 2.72->3.0, improved to 2.8,  baseline creatinine 2-2.3 b. Continue home Lasix 12. Edematous bilateral upper extremities a. Keep limbs elevated 13. Hyperkalemia a. Improved with diuresis 14. Hypothyroid stable on levothyroxine 15. Gout a. Continue current regimen 16. Bladder/prostate cancer history with ileal conduit  DVT prophylaxis: Holding Lovenox, added SCDs Diet: Full liquid Family Communication: Patient's daughter has been updated  at bedside Disposition Plan: Continue to observe overnight for recurrence of bleed and anemia.  Hopeful discharge in next 24 to 48 hours with home health PT/OT and PICC line prior to  discharge  Consultants  GI ID Cardiology Palliative care  Procedures  ERCP with sphincterotomy 04/27/2019 1 unit PRBCs on 2/5  Antibiotics   Anti-infectives (From admission, onward)   Start     Dose/Rate Route Frequency Ordered Stop   04/30/19 1600  ampicillin (OMNIPEN) 2 g in sodium chloride 0.9 % 100 mL IVPB     2 g 300 mL/hr over 20 Minutes Intravenous Every 12 hours 04/30/19 1123     04/30/19 1200  cefTRIAXone (ROCEPHIN) 2 g in sodium chloride 0.9 % 100 mL IVPB     2 g 200 mL/hr over 30 Minutes Intravenous Every 12 hours 04/30/19 1123     04/27/19 2100  vancomycin (VANCOCIN) IVPB 1000 mg/200 mL premix  Status:  Discontinued     1,000 mg 200 mL/hr over 60 Minutes Intravenous Every 48 hours 04/25/19 2129 04/26/19 1808   04/26/19 0200  piperacillin-tazobactam (ZOSYN) IVPB 2.25 g  Status:  Discontinued     2.25 g 100 mL/hr over 30 Minutes Intravenous Every 6 hours 04/25/19 2129 04/30/19 1123   04/25/19 2045  vancomycin (VANCOCIN) IVPB 1000 mg/200 mL premix     1,000 mg 200 mL/hr over 60 Minutes Intravenous  Once 04/25/19 2040 04/26/19 0028   04/25/19 1915  piperacillin-tazobactam (ZOSYN) IVPB 3.375 g     3.375 g 100 mL/hr over 30 Minutes Intravenous  Once 04/25/19 1911 04/25/19 2102           Subjective   Patient reports that he is feeling well today and denies any complaints.  Objective   Vitals:   05/01/19 0431 05/01/19 0500 05/01/19 0859 05/01/19 1307  BP: (!) 138/56  (!) 153/53 (!) 140/55  Pulse: 61  60 61  Resp: 18  19 17   Temp: 98 F (36.7 C)  (!) 97.5 F (36.4 C) (!) 97.5 F (36.4 C)  TempSrc: Oral  Oral Oral  SpO2: 100%  100% 100%  Weight:  73.8 kg    Height:        Intake/Output Summary (Last 24 hours) at 05/01/2019 1740 Last data filed at 05/01/2019 1604 Gross per 24 hour  Intake 3544.21 ml  Output 2150 ml  Net 1394.21 ml   Filed Weights   04/28/19 0500 04/29/19 2104 05/01/19 0500  Weight: 72.3 kg 72.2 kg 73.8 kg     Examination:  Physical Exam Vitals and nursing note reviewed.  HENT:     Head: Normocephalic.     Mouth/Throat:     Mouth: Mucous membranes are moist.  Eyes:     Conjunctiva/sclera: Conjunctivae normal.  Cardiovascular:     Rate and Rhythm: Normal rate.     Comments: Paced on telemetry Pulmonary:     Effort: Pulmonary effort is normal.     Breath sounds: Normal breath sounds.  Abdominal:     General: There is no distension.     Tenderness: There is no abdominal tenderness.  Musculoskeletal:        General: No tenderness.     Comments: Bilateral upper extremity swelling improving  Skin:    Coloration: Skin is pale. Skin is not jaundiced.  Neurological:     Mental Status: He is alert. Mental status is at baseline.  Psychiatric:        Mood and Affect: Mood normal.  Behavior: Behavior normal.     Data Reviewed: I have personally reviewed following labs and imaging studies  CBC: Recent Labs  Lab 04/25/19 1749 04/26/19 0425 04/27/19 0419 04/27/19 0419 04/28/19 0449 04/28/19 0449 04/29/19 0521 04/29/19 0521 04/29/19 1135 04/29/19 1135 04/29/19 1801 04/30/19 0028 04/30/19 0802 04/30/19 1612 05/01/19 0500  WBC 15.2*   < > 10.5   < > 7.1  --  9.2  --  7.4  --   --  6.2  --   --  9.5  NEUTROABS 13.9*  --  8.6*  --   --   --   --   --   --   --   --   --   --   --   --   HGB 11.5*   < > 9.6*   < > 9.8*   < > 8.3*   < > 7.7*   < > 7.5* 7.0* 6.4* 7.8* 7.6*  HCT 36.0*   < > 30.4*   < > 32.1*   < > 25.5*   < > 24.3*   < > 25.1* 23.1* 21.3* 25.0* 23.7*  MCV 102.9*   < > 101.7*   < > 105.2*  --  100.4*  --  99.6  --   --  105.5*  --   --  101.3*  PLT 231   < > 157   < > 178  --  183  --  172  --   --  166  --   --  137*   < > = values in this interval not displayed.   Basic Metabolic Panel: Recent Labs  Lab 04/27/19 0419 04/28/19 0449 04/29/19 0521 04/30/19 0028 05/01/19 0500  NA 138 137 139 142 142  K 4.6 5.5* 5.5* 4.9 4.5  CL 107 109 112* 116* 110   CO2 21* 20* 19* 17* 19*  GLUCOSE 92 168* 90 117* 79  BUN 43* 43* 49* 48* 46*  CREATININE 2.25* 2.42* 2.72* 3.06* 2.85*  CALCIUM 7.7* 7.5* 7.4* 7.3* 6.7*  MG  --   --   --  2.2 2.0   GFR: Estimated Creatinine Clearance: 14.5 mL/min (A) (by C-G formula based on SCr of 2.85 mg/dL (H)). Liver Function Tests: Recent Labs  Lab 04/26/19 0425 04/27/19 0419 04/28/19 0449 04/29/19 0521 04/30/19 0028  AST 501* 231* 142* 151* 96*  ALT 292* 196* 162* 144* 117*  ALKPHOS 157* 137* 152* 142* 130*  BILITOT 3.4* 3.3* 3.0* 2.3* 0.8  PROT 6.0* 5.9* 6.3* 5.5* 4.8*  ALBUMIN 2.7* 2.6* 2.5* 2.2* 2.1*   Recent Labs  Lab 04/25/19 1749  LIPASE 33   No results for input(s): AMMONIA in the last 168 hours. Coagulation Profile: No results for input(s): INR, PROTIME in the last 168 hours. Cardiac Enzymes: Recent Labs  Lab 04/25/19 2225  CKTOTAL 41*  CKMB 1.9   BNP (last 3 results) No results for input(s): PROBNP in the last 8760 hours. HbA1C: No results for input(s): HGBA1C in the last 72 hours. CBG: No results for input(s): GLUCAP in the last 168 hours. Lipid Profile: No results for input(s): CHOL, HDL, LDLCALC, TRIG, CHOLHDL, LDLDIRECT in the last 72 hours. Thyroid Function Tests: No results for input(s): TSH, T4TOTAL, FREET4, T3FREE, THYROIDAB in the last 72 hours. Anemia Panel: No results for input(s): VITAMINB12, FOLATE, FERRITIN, TIBC, IRON, RETICCTPCT in the last 72 hours. Sepsis Labs: Recent Labs  Lab 04/25/19 2033 04/25/19 2225  LATICACIDVEN 1.6 2.2*  Recent Results (from the past 240 hour(s))  Urine Culture     Status: Abnormal   Collection Time: 04/25/19  5:47 PM   Specimen: Urine, Random  Result Value Ref Range Status   Specimen Description   Final    URINE, RANDOM Performed at [facility] [address] [place], [place] [address]    Special Requests   Final    RIGHT NEPHROSTMY Performed at [facility] [address], [place], [place] [address]    Culture MULTIPLE SPECIES PRESENT, SUGGEST RECOLLECTION (A)  Final   Report Status 04/26/2019 FINAL  Final  Urine culture     Status: Abnormal   Collection Time: 04/25/19  5:49 PM   Specimen: Urine, Random  Result Value Ref Range Status   Specimen Description   Final    URINE, RANDOM Performed at [facility] [address] [place], [place] [address]    Special Requests   Final    NONE Performed at [facility], [place] [address] [place], [place] [address]    Culture MULTIPLE SPECIES PRESENT, SUGGEST RECOLLECTION (A)  Final   Report Status 04/26/2019 FINAL  Final  Respiratory Panel by RT PCR (Flu A&B, Covid) - Nasopharyngeal Swab     Status: None   Collection Time: 04/25/19  6:53 PM   Specimen: Nasopharyngeal Swab  Result Value Ref Range Status   SARS Coronavirus 2 by RT PCR NEGATIVE NEGATIVE Final    Comment: (NOTE) SARS-CoV-2 target nucleic acids are NOT DETECTED. The SARS-CoV-2 RNA is generally detectable in upper respiratoy specimens during the acute phase of infection. The lowest concentration of SARS-CoV-2 viral copies this assay can detect is 131 copies/mL. A negative result does not preclude SARS-Cov-2 infection and should not be used as the sole basis for treatment or other patient management decisions. A negative result may occur with  improper specimen collection/handling, submission of specimen other than nasopharyngeal swab, presence of viral mutation(s) within the areas targeted by this assay, and inadequate number of viral copies (<131 copies/mL). A negative result must be combined with clinical observations, patient history, and epidemiological information. The expected result is Negative. Fact Sheet for Patients:  PinkCheek.be Fact Sheet for Healthcare Providers:  GravelBags.it This test is not yet ap proved or cleared by the [place] FDA and  has been authorized for detection and/or diagnosis of SARS-CoV-2 by FDA under an Emergency Use Authorization (EUA). This EUA will remain  in effect (meaning this test can be used) for the duration of the COVID-19 declaration under Section 564(b)(1) of the Act, 21 U.S.C. section 360bbb-3(b)(1), unless the authorization is terminated or revoked sooner.    Influenza A by PCR NEGATIVE NEGATIVE Final   Influenza B by PCR NEGATIVE NEGATIVE Final    Comment: (NOTE) The Xpert Xpress SARS-CoV-2/FLU/RSV assay is intended as an aid in  the diagnosis of influenza from Nasopharyngeal swab specimens and  should not be used as a sole basis for treatment. Nasal washings and  aspirates are unacceptable for Xpert Xpress SARS-CoV-2/FLU/RSV  testing. Fact Sheet for Patients: PinkCheek.be Fact Sheet for Healthcare Providers: GravelBags.it This test is not yet approved or cleared by the [place] FDA and  has been authorized for detection and/or diagnosis of SARS-CoV-2 by  FDA under an Emergency Use Authorization (EUA). This EUA will remain  in effect (meaning this test can be used) for the duration of the  Covid-19 declaration under Section 564(b)(1) of the Act, 21  U.S.C. section 360bbb-3(b)(1), unless the authorization is  terminated or revoked. Performed at [facility], [place] [address] [place], [place] [address]   Blood culture (routine x 2)     Status: Abnormal   Collection Time: 04/25/19  8:29 PM   Specimen: BLOOD  Result Value Ref Range Status   Specimen Description BLOOD LEFT ANTECUBITAL  Final   Special Requests   Final    BOTTLES DRAWN AEROBIC AND ANAEROBIC Blood Culture adequate volume Performed at [facility] [address], [place], [place] [address]    Culture  Setup Time   Final    GRAM POSITIVE COCCI IN BOTH AEROBIC AND ANAEROBIC BOTTLES CRITICAL RESULT CALLED TO,  READ BACK BY AND VERIFIED WITH: JUSTIN LEGGE @1455  04/26/19 AKT    Culture (A)  Final    ENTEROCOCCUS CASSELIFLAVUS SUSCEPTIBILITIES PERFORMED ON PREVIOUS CULTURE WITHIN THE LAST 5 DAYS. Performed at [facility], [place] [address], [place], [place] [address]    Report Status 04/28/2019 FINAL  Final  Blood culture (routine x 2)     Status: Abnormal   Collection Time: 04/25/19  8:33 PM   Specimen: BLOOD RIGHT FOREARM  Result Value Ref Range Status   Specimen Description BLOOD RIGHT FOREARM  Final   Special Requests   Final    BOTTLES DRAWN AEROBIC AND ANAEROBIC Blood Culture adequate volume Performed at [facility] [address], [place], [place] [address]    Culture  Setup Time   Final    GRAM POSITIVE COCCI IN BOTH AEROBIC AND ANAEROBIC BOTTLES CRITICAL VALUE NOTED.  VALUE IS CONSISTENT WITH PREVIOUSLY REPORTED AND CALLED VALUE. AKT Organism ID to follow CRITICAL RESULT CALLED TO, READ BACK BY AND VERIFIED WITH: Christean Grief [facility] 04/26/19 1750 JDW Performed at [facility], [place] [address] [place], [place] [address]    Culture ENTEROCOCCUS CASSELIFLAVUS (A)  Final   Report Status 04/28/2019 FINAL  Final   Organism ID, Bacteria ENTEROCOCCUS CASSELIFLAVUS  Final      Susceptibility   Enterococcus casseliflavus - MIC*    AMPICILLIN <=2 SENSITIVE Sensitive     VANCOMYCIN RESISTANT Resistant     GENTAMICIN SYNERGY SENSITIVE Sensitive     LINEZOLID 2 SENSITIVE Sensitive     * ENTEROCOCCUS CASSELIFLAVUS  Blood Culture ID Panel (Reflexed)     Status: Abnormal   Collection Time: 04/25/19  8:33 PM  Result Value Ref Range Status   Enterococcus species DETECTED (A) NOT DETECTED Final    Comment: CRITICAL RESULT CALLED TO, READ BACK BY AND VERIFIED WITH: J LEGGE PHARMD 04/26/19 1750 JDW    Vancomycin resistance NOT DETECTED NOT DETECTED Final   Listeria monocytogenes NOT DETECTED NOT DETECTED Final   Staphylococcus species NOT DETECTED NOT DETECTED Final    Staphylococcus aureus (BCID) NOT DETECTED NOT DETECTED Final   Streptococcus species NOT DETECTED NOT DETECTED Final   Streptococcus agalactiae NOT DETECTED NOT DETECTED Final   Streptococcus pneumoniae NOT DETECTED NOT DETECTED Final   Streptococcus pyogenes NOT DETECTED NOT DETECTED Final   Acinetobacter baumannii NOT DETECTED NOT DETECTED Final   Enterobacteriaceae species NOT DETECTED NOT DETECTED Final   Enterobacter cloacae complex NOT DETECTED NOT DETECTED Final   Escherichia coli NOT DETECTED NOT DETECTED Final   Klebsiella oxytoca NOT DETECTED NOT DETECTED Final   Klebsiella pneumoniae NOT DETECTED NOT DETECTED Final   Proteus species NOT DETECTED NOT DETECTED Final   Serratia marcescens NOT DETECTED NOT DETECTED Final  Haemophilus influenzae NOT DETECTED NOT DETECTED Final   Neisseria meningitidis NOT DETECTED NOT DETECTED Final   Pseudomonas aeruginosa NOT DETECTED NOT DETECTED Final   Candida albicans NOT DETECTED NOT DETECTED Final   Candida glabrata NOT DETECTED NOT DETECTED Final   Candida krusei NOT DETECTED NOT DETECTED Final   Candida parapsilosis NOT DETECTED NOT DETECTED Final   Candida tropicalis NOT DETECTED NOT DETECTED Final    Comment: Performed at [facility], [place] [address] [place], [place] [address]  Culture, blood (routine x 2)     Status: None (Preliminary result)   Collection Time: 04/27/19 10:14 AM   Specimen: BLOOD LEFT HAND  Result Value Ref Range Status   Specimen Description   Final    BLOOD LEFT HAND Performed at [facility] [address] [place], [place] [address]    Special Requests   Final    BOTTLES DRAWN AEROBIC ONLY Blood Culture adequate volume Performed at [facility] [address] [place], [place] [address]    Culture   Final    NO GROWTH 4 DAYS Performed at [facility], [place] [address], [place], [place] [address]    Report Status PENDING  Incomplete  Culture, blood  (routine x 2)     Status: None (Preliminary result)   Collection Time: 04/27/19 10:14 AM   Specimen: BLOOD  Result Value Ref Range Status   Specimen Description   Final    BLOOD LEFT ARM Performed at [facility] [address] [place], [place] [address]    Special Requests   Final    BOTTLES DRAWN AEROBIC ONLY Blood Culture adequate volume Performed at [facility] [address], [place], [place] [address]    Culture   Final    NO GROWTH 4 DAYS Performed at [facility], [place] [address], [place], [place] [address]    Report Status PENDING  Incomplete         Radiology Studies: [place] EKG SITE RITE  Result Date: 04/30/2019 If Site Rite image not attached, placement could not be confirmed due to current cardiac rhythm.       Scheduled Meds: . cholecalciferol  1,000 Units Oral Daily  . docusate sodium  300 mg Oral Daily  . febuxostat  40 mg Oral Daily  . furosemide  20 mg Oral QODAY  . levothyroxine  25 mcg Oral Q0600  . metoprolol succinate  12.5 mg Oral Daily  . multivitamin  1 tablet Oral Daily  . pantoprazole (PROTONIX) IV  40 mg Intravenous Q12H   Continuous Infusions: . sodium chloride Stopped (04/29/19 1820)  . sodium chloride 75 mL/hr at 05/01/19 1432  . ampicillin (OMNIPEN) IV 2 g (05/01/19 1604)  . cefTRIAXone (ROCEPHIN)  IV 2 g (05/01/19 1506)     LOS: 6 days    Time spent: 20 minutes with over 50% of the time coordinating the patient's care    Harold Hedge, DO [facility] Pager [phone]  If 7PM-7AM, please contact night-coverage [email or web address] Password [facility] 05/01/2019, 5:40 PM

## 2019-05-01 NOTE — Progress Notes (Signed)
[name] [name] [facility] Progress Note  [name] [name] 86 y.o. 1922/03/21   Subjective: Black stool this evening and this morning.  Objective: Vital signs: Vitals:   05/01/19 0859 05/01/19 1307  BP: (!) 153/53 (!) 140/55  Pulse: 60 61  Resp: 19 17  Temp: (!) 97.5 F (36.4 C) (!) 97.5 F (36.4 C)  SpO2: 100% 100%    Physical Exam: Unable to examine due to PICC line procedure  Lab Results: Recent Labs    04/30/19 0028 05/01/19 0500  NA 142 142  K 4.9 4.5  CL 116* 110  CO2 17* 19*  GLUCOSE 117* 79  BUN 48* 46*  CREATININE 3.06* 2.85*  CALCIUM 7.3* 6.7*  MG 2.2 2.0   Recent Labs    04/29/19 0521 04/30/19 0028  AST 151* 96*  ALT 144* 117*  ALKPHOS 142* 130*  BILITOT 2.3* 0.8  PROT 5.5* 4.8*  ALBUMIN 2.2* 2.1*   Recent Labs    04/30/19 0028 04/30/19 0802 04/30/19 1612 05/01/19 0500  WBC 6.2  --   --  9.5  HGB 7.0*   < > 7.8* 7.6*  HCT 23.1*   < > 25.0* 23.7*  MCV 105.5*  --   --  101.3*  PLT 166  --   --  137*   < > = values in this interval not displayed.      Assessment/Plan: Cholangitis with enterococcal bacteremia on IV Abx with GI bleeding question from upper tract vs right-sided diverticular bleed. Hgb 7.6 (7.8, 6.4) May need re-evaluation of sphincterotomy if melena persists. Continue IV PPI Q 12 hours. Continue POs. Will re-visit tomorrow.   [name] [name] 05/01/2019, 5:53 PM  Questions please call [phone] ID: [name] [name], male   DOB: [birth year], 86 y.o.   MRN: [id number]

## 2019-05-01 NOTE — Progress Notes (Signed)
Peripherally Inserted Central Catheter/Midline Placement  The IV Nurse has discussed with the patient and/or persons authorized to consent for the patient, the purpose of this procedure and the potential benefits and risks involved with this procedure.  The benefits include less needle sticks, lab draws from the catheter, and the patient may be discharged home with the catheter. Risks include, but not limited to, infection, bleeding, blood clot (thrombus formation), and puncture of an artery; nerve damage and irregular heartbeat and possibility to perform a PICC exchange if needed/ordered by physician.  Alternatives to this procedure were also discussed.  Bard Power PICC patient education guide, fact sheet on infection prevention and patient information card has been provided to patient /or left at bedside.  Pt gave verbal consent, but due to legally blind, he asked his daughter at bedside to sign for him.  Witnessed by Cherie Ouch RN.  PICC/Midline Placement Documentation  PICC Single Lumen 05/01/19 PICC Right Brachial 38 cm 0 cm (Active)  Indication for Insertion or Continuance of Line Home intravenous therapies (PICC only) 05/01/19 1837  Exposed Catheter (cm) 0 cm 05/01/19 1837  Site Assessment Clean;Dry;Intact 05/01/19 1837  Line Status Flushed;Saline locked;Blood return noted 05/01/19 1837  Dressing Type Transparent 05/01/19 1837  Dressing Status Clean;Dry;Intact;Antimicrobial disc in place 05/01/19 [place] checked and tightened 05/01/19 1837  Line Adjustment (NICU/IV Team Only) No 05/01/19 1837  Dressing Intervention New dressing 05/01/19 1837  Dressing Change Due 05/08/19 05/01/19 1837       Rolena Infante 05/01/2019, 6:39 PM

## 2019-05-01 NOTE — Progress Notes (Signed)
Occupational Therapy Evaluation Patient Details Name: STEPEN PRINS MRN: [id number] DOB: [birth year] Today's Date: 05/01/2019    History of Present Illness 86 yo male admitted with UTI, possible cholelithiasis.  Pt found to have cholangitis with enterococcal bacteremia s/p choledocholithiasis removal and sphincterotomy (04/27/19) and developed rectal bleeding (maroon with clots), s/p 1 unit PRBCs 04/30/19.  Hx of aortic stenosis, CHF, pacemaker, prostate, Ca, bladder Ca, macular degeneration, DVT   Clinical Impression   PTA pt PLOF living at home alone with support from family who live in close proximity. Pt lives in one level home with AE of RW, cane, bath chair, and BSC. Pt currently limited due to fatigue, weakness, and impaired stability to perform ADLs safely. Pt will benefit from additional acute OT to address established deficits to maximize independence prior to DC setting. OT will continue to follow acutely.     Follow Up Recommendations  Home health OT;Supervision/Assistance - 24 hour    Equipment Recommendations       Recommendations for Other Services       Precautions / Restrictions Precautions Precautions: Fall Precaution Comments: R LQ urostomy, R nephrostomy Restrictions Weight Bearing Restrictions: No      Mobility Bed Mobility Overal bed mobility: Needs Assistance Bed Mobility: Supine to Sit;Sit to Supine     Supine to sit: Min assist Sit to supine: Min guard   General bed mobility comments: 1 hand held assist with elevation of trunk to sit up right.   Transfers Overall transfer level: Needs assistance Equipment used: Rolling walker (2 wheeled) Transfers: Sit to/from Stand Sit to Stand: Min assist         General transfer comment: min A for safety to power up, verbal cues for safe technique    Balance Overall balance assessment: Needs assistance         Standing balance support: Bilateral upper extremity supported Standing balance-Leahy Scale:  Poor                             ADL either performed or assessed with clinical judgement   ADL Overall ADL's : Needs assistance/impaired Eating/Feeding: (Not tested diet restrictions.)   Grooming: Wash/dry face;Oral care;Brushing hair;Set up;Sitting   Upper Body Bathing: Set up;Supervision/ safety;Sitting   Lower Body Bathing: Minimal assistance;Moderate assistance;Sitting/lateral leans   Upper Body Dressing : Modified independent;Sitting   Lower Body Dressing: Minimal assistance;Moderate assistance;Sitting/lateral leans   Toilet Transfer: Minimal assistance;Cueing for safety;Cueing for sequencing;RW Toilet Transfer Details (indicate cue type and reason): Min a to power to stand with RW. VCs for safety with RW and sequencing.  Toileting- Clothing Manipulation and Hygiene: Maximal assistance Toileting - Clothing Manipulation Details (indicate cue type and reason): catheter     Functional mobility during ADLs: Minimal assistance;Rolling walker;Cueing for safety;Cueing for sequencing General ADL Comments: Pt requires increased time with functional mobility and ADLs. Max VC's for safety.     Vision         Perception     Praxis      Pertinent Vitals/Pain Pain Assessment: No/denies pain     Hand Dominance Right   Extremity/Trunk Assessment Upper Extremity Assessment Upper Extremity Assessment: Generalized weakness   Lower Extremity Assessment Lower Extremity Assessment: Defer to PT evaluation   Cervical / Trunk Assessment Cervical / Trunk Assessment: Kyphotic   Communication Communication Communication: HOH   Cognition Arousal/Alertness: Awake/alert Behavior During Therapy: WFL for tasks assessed/performed Overall Cognitive Status: Within Functional Limits for tasks assessed  General Comments       Exercises     Shoulder Instructions      Home Living Family/patient expects to be discharged  to:: Private residence Living Arrangements: Alone Available Help at Discharge: Family;Available PRN/intermittently(daughter lives next door) Type of Home: House       Home Layout: Two level;Able to live on main level with bedroom/bathroom     Bathroom Shower/Tub: Walk-in shower         Home Equipment: Environmental consultant - 2 wheels;Cane - single point;Shower seat   Additional Comments: usually sponge bathes      Prior Functioning/Environment Level of Independence: Needs assistance  Gait / Transfers Assistance Needed: uses cane for ambulation. daughter walks with him outside daily for exercise ADL's / Homemaking Assistance Needed: assist for bathing LEs. Meals on wheels.   Comments: likes to walk daily; daughter lives next door        OT Problem List: Decreased strength;Impaired balance (sitting and/or standing);Decreased safety awareness;Decreased knowledge of use of DME or AE      OT Treatment/Interventions: Self-care/ADL training;Therapeutic exercise;DME and/or AE instruction;Therapeutic activities;Patient/family education;Balance training    OT Goals(Current goals can be found in the care plan section) Acute Rehab OT Goals Patient Stated Goal: home OT Goal Formulation: With patient Time For Goal Achievement: 05/15/19 Potential to Achieve Goals: Fair  OT Frequency: Min 2X/week   Barriers to D/C: Decreased caregiver support          Co-evaluation              AM-PAC OT "6 Clicks" Daily Activity     Outcome Measure Help from another person eating meals?: None Help from another person taking care of personal grooming?: A Little Help from another person toileting, which includes using toliet, bedpan, or urinal?: Total Help from another person bathing (including washing, rinsing, drying)?: A Little Help from another person to put on and taking off regular upper body clothing?: A Little Help from another person to put on and taking off regular lower body clothing?: A  Lot 6 Click Score: 16   End of Session Equipment Utilized During Treatment: Gait belt;Rolling walker Nurse Communication: Mobility status  Activity Tolerance: Patient tolerated treatment well Patient left: in bed;with call bell/phone within reach;with bed alarm set  OT Visit Diagnosis: Unsteadiness on feet (R26.81);Muscle weakness (generalized) (M62.81)                Time: 9518-8416 OT Time Calculation (min): 24 min Charges:  OT General Charges $OT Visit: 1 Visit OT Evaluation $OT Eval Low Complexity: 1 Low OT Treatments $Self Care/Home Management : 8-22 mins  Minus Breeding, MSOT, OTR/L  [facility]  [phone]   Marius Ditch 05/01/2019, 9:29 AM

## 2019-05-01 NOTE — Progress Notes (Signed)
Spoke with Abby RN re PICC order for d/c home. No d/c date at this time.  Will notify via consult if d/c date planned. PICC possibly to be done 2/7.

## 2019-05-01 NOTE — Progress Notes (Addendum)
During PICC placement, pt stated he could feel his pacemaker wire shock him and jumped/flinched and grunted.  At time of him feeling it, the PICC would have been approximately in the right subclavian/innominate vein region with Left sided pacer.  PICC soon after dropped into the SVC without difficulty.  Pt in Bigeminy or trigeminy entire time of placement without change in rhythm noted.  Dr Neysa Bonito notified of event, Dtr notified of event.  Dr Orene Desanctis paged, no return page at this time.

## 2019-05-02 DIAGNOSIS — K922 Gastrointestinal hemorrhage, unspecified: Secondary | ICD-10-CM

## 2019-05-02 LAB — PREPARE RBC (CROSSMATCH)

## 2019-05-02 LAB — BASIC METABOLIC PANEL
Anion gap: 4 — ABNORMAL LOW (ref 5–15)
BUN: 37 mg/dL — ABNORMAL HIGH (ref 8–23)
CO2: 23 mmol/L (ref 22–32)
Calcium: 7 mg/dL — ABNORMAL LOW (ref 8.9–10.3)
Chloride: 113 mmol/L — ABNORMAL HIGH (ref 98–111)
Creatinine, Ser: 2.38 mg/dL — ABNORMAL HIGH (ref 0.61–1.24)
GFR calc Af Amer: 26 mL/min — ABNORMAL LOW (ref >60)
GFR calc non Af Amer: 22 mL/min — ABNORMAL LOW (ref >60)
Glucose, Bld: 93 mg/dL (ref 70–99)
Potassium: 4.6 mmol/L (ref 3.5–5.1)
Sodium: 140 mmol/L (ref 135–145)

## 2019-05-02 LAB — CBC
HCT: 21.3 % — ABNORMAL LOW (ref 39.0–52.0)
Hemoglobin: 6.7 g/dL — CL (ref 13.0–17.0)
MCH: 32.4 pg (ref 26.0–34.0)
MCHC: 31.5 g/dL (ref 30.0–36.0)
MCV: 102.9 fL — ABNORMAL HIGH (ref 80.0–100.0)
Platelets: 146 10*3/uL — ABNORMAL LOW (ref 150–400)
RBC: 2.07 MIL/uL — ABNORMAL LOW (ref 4.22–5.81)
RDW: 17.4 % — ABNORMAL HIGH (ref 11.5–15.5)
WBC: 9.3 10*3/uL (ref 4.0–10.5)
nRBC: 0 % (ref 0.0–0.2)

## 2019-05-02 LAB — CULTURE, BLOOD (ROUTINE X 2)
Culture: NO GROWTH
Culture: NO GROWTH
Special Requests: ADEQUATE
Special Requests: ADEQUATE

## 2019-05-02 LAB — GLUCOSE, CAPILLARY: Glucose-Capillary: 126 mg/dL — ABNORMAL HIGH (ref 70–99)

## 2019-05-02 LAB — HEMOGLOBIN AND HEMATOCRIT, BLOOD
HCT: 26 % — ABNORMAL LOW (ref 39.0–52.0)
HCT: 25.4 % — ABNORMAL LOW (ref 39.0–52.0)
Hemoglobin: 8.4 g/dL — ABNORMAL LOW (ref 13.0–17.0)
Hemoglobin: 7.9 g/dL — ABNORMAL LOW (ref 13.0–17.0)

## 2019-05-02 LAB — CALCIUM, IONIZED: Calcium, Ionized, Serum: 3.9 mg/dL — ABNORMAL LOW (ref 4.5–5.6)

## 2019-05-02 MED ORDER — SODIUM CHLORIDE 0.9 % IV SOLN
2.0000 g | Freq: Three times a day (TID) | INTRAVENOUS | Status: DC
  Administered 2019-05-02 – 2019-05-05 (×10): 2 g via INTRAVENOUS
  Filled 2019-05-02 (×9): qty 2
  Filled 2019-05-02: qty 2000
  Filled 2019-05-02: qty 2

## 2019-05-02 MED ORDER — SODIUM CHLORIDE 0.9% IV SOLUTION: Freq: Once | INTRAVENOUS | Status: AC

## 2019-05-02 NOTE — Progress Notes (Signed)
PROGRESS NOTE    [name] [name]    Code Status: DNR  IPJ:[id number] DOB: 12-26-1921 DOA: 04/25/2019  PCP: Crist Infante, MD    Hospital Summary  This is a 86 year old male with history of prostate and bladder cancer in 2006 with ileal conduit/urethral stricture, sinus node dysfunction status post PPM dual-chamber, aortic stenosis, gout, DVT who presented to [facility] with fever and left flank pain on 1/31 and shortness of breath, admitted for sepsis and found to have dural coccal bacteremia as well as found to have nonobstructing left renal stone and found to have elevated LFTs, Noncon CT showed CBD stones and intrahepatic and extrahepatic biliary dilation.  GI was consulted and he underwent ERCP with sphincterotomy with Dr. Watt Climes on 2/2.  For bacteremia, ID was consulted and recommended TTE and continue Zosyn.  At family's request, palliative care was consulted for [place] discussion who recommended home-based palliative care at discharge.  2/4 Acute blood loss anemia secondary to GI bleed. GI made aware Cardiology consulted for possible TEE and eval of severe AS  2/5 Hb 6.4. 1 u PRBCs transfused  2/7: Hb 6.7, 1 unit PRBCs transfused, bleeding scan ordered  A & P   Principal Problem:   Enterococcal bacteremia Active Problems:   Hypertension   Pacemaker   Aortic stenosis   CKD (chronic kidney disease), stage IV (HCC)   Acute lower UTI   Sepsis (HCC)   Bladder cancer (HCC)   Acute on chronic diastolic CHF (congestive heart failure) (HCC)   Abnormal liver function   Choledocholithiasis   Status post ileal conduit (HCC)   Macrocytic anemia   HOH (hard of hearing)   History of prostate cancer   1. Choledocholithiasis/cholangitis status post ERCP and sphincterotomy 04/27/2019 with Dr. Watt Climes a. Stable b. GI on board 2. Acute Blood loss Anemia secondary to GI bleed, diverticular source more likely vs less likely sphincterotomy source a. Status post 1 unit PRBC 2/5, additional unit  PRBC 2/7 for Hb 6.7 b. N.p.o. after midnight c. IV PPI twice daily d. Hold aspirin, Lovenox, continue SCDs e. will order a bleeding scan, likely to be done tomorrow 3. Sepsis on admission, sepsis has resolved a. Suspect multifactorial: UTI unknown organism, Enterococcus bacteremia, cholangitis b. Zosyn-> renally dosed ampicillin and ceftriaxone per ID c. Patient and daughter do not wish to proceed with TEE, will likely need to treat as if he does have endocarditis d. PICC placed yesterday 4. Episode of bigeminy during PICC placement a. Has resolved on telemetry today 5. Enterococcus casseliflavus bacteremia a. Zosyn-> renally dosed ampicillin and ceftriaxone per ID b. TTE without vegetations c. TEE and antibiotics as above 6. Right pleural effusion, stable 7. Severe aortic stenosis a. worsened from previous echo on 01/28/2019 b. Not a candidate for repair c. Palliative care consulted 8. HFrEF a. EF similar to prior, 20 to 25% b. Weight up, 68.9 on admission->7 73.8 c. Continue beta-blocker d. Daily weights e. Continue telemetry f. Restarted home Lasix 9. Edematous upper extremities, improving a. Keep arms elevated above heart level 10. Sick sinus syndrome status post PPM 11. Elevated troponin, suspect demand ischemia, stable a. Continue telemetry 12. AKI on CKD 4, suspect multifactorial: cardiorenal, blood loss anemia a. Creatinine 2.4-> 2.72->3.0, improved to 2.3,  baseline creatinine 2-2.3 b. Continue home Lasix 13. Hyperkalemia a. Improved with diuresis 14. Hypothyroid stable on levothyroxine 15. Gout a. Continue current regimen 16. Bladder/prostate cancer history with ileal conduit  DVT prophylaxis: Holding Lovenox, added SCDs Diet: Full liquid,  n.p.o. after midnight Family Communication: Patient's daughter has been updated at bedside Disposition Plan: Patient continues to have recurrent GI bleeding, continue inpatient management and discharge planning  Consultants   GI ID Cardiology Palliative care  Procedures  ERCP with sphincterotomy 04/27/2019 1 unit PRBCs on 2/5 1 unit PRBCs 2/7  Antibiotics   Anti-infectives (From admission, onward)   Start     Dose/Rate Route Frequency Ordered Stop   05/02/19 1200  ampicillin (OMNIPEN) 2 g in sodium chloride 0.9 % 100 mL IVPB     2 g 300 mL/hr over 20 Minutes Intravenous Every 8 hours 05/02/19 0847     04/30/19 1600  ampicillin (OMNIPEN) 2 g in sodium chloride 0.9 % 100 mL IVPB  Status:  Discontinued     2 g 300 mL/hr over 20 Minutes Intravenous Every 12 hours 04/30/19 1123 05/02/19 0847   04/30/19 1200  cefTRIAXone (ROCEPHIN) 2 g in sodium chloride 0.9 % 100 mL IVPB     2 g 200 mL/hr over 30 Minutes Intravenous Every 12 hours 04/30/19 1123     04/27/19 2100  vancomycin (VANCOCIN) IVPB 1000 mg/200 mL premix  Status:  Discontinued     1,000 mg 200 mL/hr over 60 Minutes Intravenous Every 48 hours 04/25/19 2129 04/26/19 1808   04/26/19 0200  piperacillin-tazobactam (ZOSYN) IVPB 2.25 g  Status:  Discontinued     2.25 g 100 mL/hr over 30 Minutes Intravenous Every 6 hours 04/25/19 2129 04/30/19 1123   04/25/19 2045  vancomycin (VANCOCIN) IVPB 1000 mg/200 mL premix     1,000 mg 200 mL/hr over 60 Minutes Intravenous  Once 04/25/19 2040 04/26/19 0028   04/25/19 1915  piperacillin-tazobactam (ZOSYN) IVPB 3.375 g     3.375 g 100 mL/hr over 30 Minutes Intravenous  Once 04/25/19 1911 04/25/19 2102           Subjective   Despite his lab work, patient denies any complaints at this time.  Objective   Vitals:   05/02/19 0936 05/02/19 0956 05/02/19 1002 05/02/19 1231  BP: (!) 168/56  (!) 159/54 (!) 149/58  Pulse: (!) 59 66    Resp: (!) 22     Temp: 97.8 F (36.6 C)  97.9 F (36.6 C) 98 F (36.7 C)  TempSrc: Oral  Oral Oral  SpO2: 100%  100% 97%  Weight:      Height:        Intake/Output Summary (Last 24 hours) at 05/02/2019 1338 Last data filed at 05/02/2019 1227 Gross per 24 hour  Intake  2806.87 ml  Output 1625 ml  Net 1181.87 ml   Filed Weights   04/28/19 0500 04/29/19 2104 05/01/19 0500  Weight: 72.3 kg 72.2 kg 73.8 kg    Examination:  Physical Exam Vitals and nursing note reviewed.  Constitutional:      Appearance: He is not toxic-appearing.  Eyes:     Conjunctiva/sclera: Conjunctivae normal.  Cardiovascular:     Heart sounds: Murmur present.     Comments: Paced rhythm on telemetry without bigeminy Pulmonary:     Effort: No respiratory distress.     Breath sounds: Normal breath sounds.  Abdominal:     General: There is no distension.     Tenderness: There is no abdominal tenderness.  Musculoskeletal:     Comments: Improved arm swelling with ecchymoses  Neurological:     Mental Status: He is alert. Mental status is at baseline.  Psychiatric:        Mood and Affect: Mood normal.  Behavior: Behavior normal.     Data Reviewed: I have personally reviewed following labs and imaging studies  CBC: Recent Labs  Lab 04/25/19 1749 04/26/19 0425 04/27/19 0419 04/28/19 0449 04/29/19 0521 04/29/19 0521 04/29/19 1135 04/29/19 1801 04/30/19 0028 04/30/19 0802 04/30/19 1612 05/01/19 0500 05/02/19 0420  WBC 15.2*   < > 10.5   < > 9.2  --  7.4  --  6.2  --   --  9.5 9.3  NEUTROABS 13.9*  --  8.6*  --   --   --   --   --   --   --   --   --   --   HGB 11.5*   < > 9.6*   < > 8.3*   < > 7.7*   < > 7.0* 6.4* 7.8* 7.6* 6.7*  HCT 36.0*   < > 30.4*   < > 25.5*   < > 24.3*   < > 23.1* 21.3* 25.0* 23.7* 21.3*  MCV 102.9*   < > 101.7*   < > 100.4*  --  99.6  --  105.5*  --   --  101.3* 102.9*  PLT 231   < > 157   < > 183  --  172  --  166  --   --  137* 146*   < > = values in this interval not displayed.   Basic Metabolic Panel: Recent Labs  Lab 04/28/19 0449 04/29/19 0521 04/30/19 0028 05/01/19 0500 05/02/19 0420  NA 137 139 142 142 140  K 5.5* 5.5* 4.9 4.5 4.6  CL 109 112* 116* 110 113*  CO2 20* 19* 17* 19* 23  GLUCOSE 168* 90 117* 79 93  BUN  43* 49* 48* 46* 37*  CREATININE 2.42* 2.72* 3.06* 2.85* 2.38*  CALCIUM 7.5* 7.4* 7.3* 6.7* 7.0*  MG  --   --  2.2 2.0  --    GFR: Estimated Creatinine Clearance: 17.4 mL/min (A) (by C-G formula based on SCr of 2.38 mg/dL (H)). Liver Function Tests: Recent Labs  Lab 04/26/19 0425 04/27/19 0419 04/28/19 0449 04/29/19 0521 04/30/19 0028  AST 501* 231* 142* 151* 96*  ALT 292* 196* 162* 144* 117*  ALKPHOS 157* 137* 152* 142* 130*  BILITOT 3.4* 3.3* 3.0* 2.3* 0.8  PROT 6.0* 5.9* 6.3* 5.5* 4.8*  ALBUMIN 2.7* 2.6* 2.5* 2.2* 2.1*   Recent Labs  Lab 04/25/19 1749  LIPASE 33   No results for input(s): AMMONIA in the last 168 hours. Coagulation Profile: No results for input(s): INR, PROTIME in the last 168 hours. Cardiac Enzymes: Recent Labs  Lab 04/25/19 2225  CKTOTAL 41*  CKMB 1.9   BNP (last 3 results) No results for input(s): PROBNP in the last 8760 hours. HbA1C: No results for input(s): HGBA1C in the last 72 hours. CBG: No results for input(s): GLUCAP in the last 168 hours. Lipid Profile: No results for input(s): CHOL, HDL, LDLCALC, TRIG, CHOLHDL, LDLDIRECT in the last 72 hours. Thyroid Function Tests: No results for input(s): TSH, T4TOTAL, FREET4, T3FREE, THYROIDAB in the last 72 hours. Anemia Panel: No results for input(s): VITAMINB12, FOLATE, FERRITIN, TIBC, IRON, RETICCTPCT in the last 72 hours. Sepsis Labs: Recent Labs  Lab 04/25/19 2033 04/25/19 2225  LATICACIDVEN 1.6 2.2*    Recent Results (from the past 240 hour(s))  Urine Culture     Status: Abnormal   Collection Time: 04/25/19  5:47 PM   Specimen: Urine, Random  Result Value Ref Range Status   Specimen  Description   Final    URINE, RANDOM Performed at [facility], [place] [address], [place], [place] [address]    Special Requests   Final    RIGHT NEPHROSTMY Performed at [place] [address] [place], [place] [address]    Culture MULTIPLE SPECIES  PRESENT, SUGGEST RECOLLECTION (A)  Final   Report Status 04/26/2019 FINAL  Final  Urine culture     Status: Abnormal   Collection Time: 04/25/19  5:49 PM   Specimen: Urine, Random  Result Value Ref Range Status   Specimen Description   Final    URINE, RANDOM Performed at [place] [address], [place], [place] [address]    Special Requests   Final    NONE Performed at [facility], [place] [address] [place], [place] [address]    Culture MULTIPLE SPECIES PRESENT, SUGGEST RECOLLECTION (A)  Final   Report Status 04/26/2019 FINAL  Final  Respiratory Panel by RT PCR (Flu A&B, Covid) - Nasopharyngeal Swab     Status: None   Collection Time: 04/25/19  6:53 PM   Specimen: Nasopharyngeal Swab  Result Value Ref Range Status   SARS Coronavirus 2 by RT PCR NEGATIVE NEGATIVE Final    Comment: (NOTE) SARS-CoV-2 target nucleic acids are NOT DETECTED. The SARS-CoV-2 RNA is generally detectable in upper respiratoy specimens during the acute phase of infection. The lowest concentration of SARS-CoV-2 viral copies this assay can detect is 131 copies/mL. A negative result does not preclude SARS-Cov-2 infection and should not be used as the sole basis for treatment or other patient management decisions. A negative result may occur with  improper specimen collection/handling, submission of specimen other than nasopharyngeal swab, presence of viral mutation(s) within the areas targeted by this assay, and inadequate number of viral copies (<131 copies/mL). A negative result must be combined with clinical observations, patient history, and epidemiological information. The expected result is Negative. Fact Sheet for Patients:  PinkCheek.be Fact Sheet for Healthcare Providers:  GravelBags.it This test is not yet ap proved or cleared by the [place] FDA and  has been authorized for detection and/or  diagnosis of SARS-CoV-2 by FDA under an Emergency Use Authorization (EUA). This EUA will remain  in effect (meaning this test can be used) for the duration of the COVID-19 declaration under Section 564(b)(1) of the Act, 21 U.S.C. section 360bbb-3(b)(1), unless the authorization is terminated or revoked sooner.    Influenza A by PCR NEGATIVE NEGATIVE Final   Influenza B by PCR NEGATIVE NEGATIVE Final    Comment: (NOTE) The Xpert Xpress SARS-CoV-2/FLU/RSV assay is intended as an aid in  the diagnosis of influenza from Nasopharyngeal swab specimens and  should not be used as a sole basis for treatment. Nasal washings and  aspirates are unacceptable for Xpert Xpress SARS-CoV-2/FLU/RSV  testing. Fact Sheet for Patients: PinkCheek.be Fact Sheet for Healthcare Providers: GravelBags.it This test is not yet approved or cleared by the [place] FDA and  has been authorized for detection and/or diagnosis of SARS-CoV-2 by  FDA under an Emergency Use Authorization (EUA). This EUA will remain  in effect (meaning this test can be used) for the duration of the  Covid-19 declaration under Section 564(b)(1) of the Act, 21  U.S.C. section 360bbb-3(b)(1), unless the authorization is  terminated or revoked. Performed at [facility], [place] [address], [place], [place] [address]   Blood culture (routine x 2)     Status: Abnormal  Collection Time: 04/25/19  8:29 PM   Specimen: BLOOD  Result Value Ref Range Status   Specimen Description BLOOD LEFT ANTECUBITAL  Final   Special Requests   Final    BOTTLES DRAWN AEROBIC AND ANAEROBIC Blood Culture adequate volume Performed at [place] [address], [place], [place] [address]    Culture  Setup Time   Final    GRAM POSITIVE COCCI IN BOTH AEROBIC AND ANAEROBIC BOTTLES CRITICAL RESULT CALLED TO, READ BACK BY AND VERIFIED WITH: JUSTIN LEGGE @1455  04/26/19  AKT    Culture (A)  Final    ENTEROCOCCUS CASSELIFLAVUS SUSCEPTIBILITIES PERFORMED ON PREVIOUS CULTURE WITHIN THE LAST 5 DAYS. Performed at [facility], [place] [address] [place], [place] [address]    Report Status 04/28/2019 FINAL  Final  Blood culture (routine x 2)     Status: Abnormal   Collection Time: 04/25/19  8:33 PM   Specimen: BLOOD RIGHT FOREARM  Result Value Ref Range Status   Specimen Description BLOOD RIGHT FOREARM  Final   Special Requests   Final    BOTTLES DRAWN AEROBIC AND ANAEROBIC Blood Culture adequate volume Performed at [place] [address] [place], [place] [address]    Culture  Setup Time   Final    GRAM POSITIVE COCCI IN BOTH AEROBIC AND ANAEROBIC BOTTLES CRITICAL VALUE NOTED.  VALUE IS CONSISTENT WITH PREVIOUSLY REPORTED AND CALLED VALUE. AKT Organism ID to follow CRITICAL RESULT CALLED TO, READ BACK BY AND VERIFIED WITH: Christean Grief [facility] 04/26/19 1750 JDW Performed at [facility], [place] [address], [place], [place] [address]    Culture ENTEROCOCCUS CASSELIFLAVUS (A)  Final   Report Status 04/28/2019 FINAL  Final   Organism ID, Bacteria ENTEROCOCCUS CASSELIFLAVUS  Final      Susceptibility   Enterococcus casseliflavus - MIC*    AMPICILLIN <=2 SENSITIVE Sensitive     VANCOMYCIN RESISTANT Resistant     GENTAMICIN SYNERGY SENSITIVE Sensitive     LINEZOLID 2 SENSITIVE Sensitive     * ENTEROCOCCUS CASSELIFLAVUS  Blood Culture ID Panel (Reflexed)     Status: Abnormal   Collection Time: 04/25/19  8:33 PM  Result Value Ref Range Status   Enterococcus species DETECTED (A) NOT DETECTED Final    Comment: CRITICAL RESULT CALLED TO, READ BACK BY AND VERIFIED WITH: J LEGGE PHARMD 04/26/19 1750 JDW    Vancomycin resistance NOT DETECTED NOT DETECTED Final   Listeria monocytogenes NOT DETECTED NOT DETECTED Final   Staphylococcus species NOT DETECTED NOT DETECTED Final   Staphylococcus aureus (BCID) NOT DETECTED NOT DETECTED Final    Streptococcus species NOT DETECTED NOT DETECTED Final   Streptococcus agalactiae NOT DETECTED NOT DETECTED Final   Streptococcus pneumoniae NOT DETECTED NOT DETECTED Final   Streptococcus pyogenes NOT DETECTED NOT DETECTED Final   Acinetobacter baumannii NOT DETECTED NOT DETECTED Final   Enterobacteriaceae species NOT DETECTED NOT DETECTED Final   Enterobacter cloacae complex NOT DETECTED NOT DETECTED Final   Escherichia coli NOT DETECTED NOT DETECTED Final   Klebsiella oxytoca NOT DETECTED NOT DETECTED Final   Klebsiella pneumoniae NOT DETECTED NOT DETECTED Final   Proteus species NOT DETECTED NOT DETECTED Final   Serratia marcescens NOT DETECTED NOT DETECTED Final   Haemophilus influenzae NOT DETECTED NOT DETECTED Final   Neisseria meningitidis NOT DETECTED NOT DETECTED Final   Pseudomonas aeruginosa NOT DETECTED NOT DETECTED Final   Candida albicans NOT DETECTED NOT DETECTED Final   Candida glabrata NOT  DETECTED NOT DETECTED Final   Candida krusei NOT DETECTED NOT DETECTED Final   Candida parapsilosis NOT DETECTED NOT DETECTED Final   Candida tropicalis NOT DETECTED NOT DETECTED Final    Comment: Performed at [facility], [place] [address], [place], [place] [address]  Culture, blood (routine x 2)     Status: None   Collection Time: 04/27/19 10:14 AM   Specimen: BLOOD LEFT HAND  Result Value Ref Range Status   Specimen Description   Final    BLOOD LEFT HAND Performed at [place] [address] [place], [place] [address]    Special Requests   Final    BOTTLES DRAWN AEROBIC ONLY Blood Culture adequate volume Performed at [place] [address], [place], [place] [address]    Culture   Final    NO GROWTH 5 DAYS Performed at [facility], [place] [address], [place], [place] [address]    Report Status 05/02/2019 FINAL  Final  Culture, blood (routine x 2)     Status: None   Collection Time: 04/27/19 10:14 AM   Specimen: BLOOD   Result Value Ref Range Status   Specimen Description   Final    BLOOD LEFT ARM Performed at [place] [address] [place], [place] [address]    Special Requests   Final    BOTTLES DRAWN AEROBIC ONLY Blood Culture adequate volume Performed at [place] [address] [place], [place] [address]    Culture   Final    NO GROWTH 5 DAYS Performed at [facility], [place] [address] [place], [place] [address]    Report Status 05/02/2019 FINAL  Final         Radiology Studies: No results found.      Scheduled Meds: . Chlorhexidine Gluconate Cloth  6 each Topical Daily  . Chlorhexidine Gluconate Cloth  6 each Topical Daily  . cholecalciferol  1,000 Units Oral Daily  . docusate sodium  300 mg Oral Daily  . febuxostat  40 mg Oral Daily  . furosemide  20 mg Oral QODAY  . levothyroxine  25 mcg Oral Q0600  . metoprolol succinate  12.5 mg Oral Daily  . multivitamin  1 tablet Oral Daily  . pantoprazole (PROTONIX) IV  40 mg Intravenous Q12H  . sodium chloride flush  10-40 mL Intracatheter Q12H   Continuous Infusions: . sodium chloride Stopped (04/29/19 1820)  . sodium chloride 75 mL/hr at 05/02/19 0600  . ampicillin (OMNIPEN) IV 2 g (05/02/19 1241)  . cefTRIAXone (ROCEPHIN)  IV Stopped (05/02/19 0534)     LOS: 7 days    Time spent: 26 minutes with over 50% of the time coordinating the patient's care    Harold Hedge, DO [facility] Pager [phone]  If 7PM-7AM, please contact night-coverage [email or web address] Password [facility] 05/02/2019, 1:38 PM

## 2019-05-02 NOTE — Progress Notes (Signed)
PHARMACY CONSULT NOTE FOR:  OUTPATIENT  PARENTERAL ANTIBIOTIC THERAPY (OPAT) --> updated ampicillin dose  Indication: enterococcal bacteremia; presumptive endocarditis Regimen: ampicillin 2gm IV q8h and ceftriaxone 2gm q12h End date: 05/23/2019   IV antibiotic discharge orders are pended. To discharging provider:  please sign these orders via discharge navigator,  Select New Orders & click on the button choice - Manage This Unsigned Work.     Thank you for allowing pharmacy to be a part of this patient's care.  Lynelle Doctor 05/02/2019, 8:48 AM

## 2019-05-02 NOTE — Progress Notes (Signed)
CRITICAL VALUE STICKER  CRITICAL VALUE: HGB 6.7  DATE & TIME NOTIFIED: 05/02/2019  0439  MD NOTIFIED: Kennon Holter, NP  TIME OF NOTIFICATION: 6553  RESPONSE: awaiting response

## 2019-05-02 NOTE — Progress Notes (Signed)
PHARMACY NOTE:  ANTIMICROBIAL RENAL DOSAGE ADJUSTMENT  Current antimicrobial regimen includes a mismatch between antimicrobial dosage and estimated renal function.  As per policy approved by the Pharmacy & Therapeutics and Medical Executive Committees, the antimicrobial dosage will be adjusted accordingly.  Current antimicrobial dosage:  Ampicillin 2gm IV q12h  Indication: enterococcus bacteremia and presumptive endocarditis  Renal Function:  Estimated Creatinine Clearance: 17.4 mL/min (A) (by C-G formula based on SCr of 2.38 mg/dL (H)). []      On intermittent HD, scheduled: []      On CRRT    Antimicrobial dosage has been changed to:  Ampicillin 2 gm IV q8h    Thank you for allowing pharmacy to be a part of this patient's care.  Lynelle Doctor, [facility] 05/02/2019 8:44 AM

## 2019-05-02 NOTE — Progress Notes (Signed)
[facility] Gastroenterology Progress Note  [name] [name] 86 y.o. 1921-04-24   Subjective: Small black stool this morning. S/P 1 U PRBCs today. Denies abdominal pain, nausea, or vomiting. Hungry. Daughter at bedside.  Objective: Vital signs: Vitals:   05/02/19 1002 05/02/19 1231  BP: (!) 159/54 (!) 149/58  Pulse:    Resp:    Temp: 97.9 F (36.6 C) 98 F (36.7 C)  SpO2: 100% 97%  P 66, R 22  Physical Exam: Gen: lethargic, elderly, no acute distress, well-nourished, thin HEENT: anicteric sclera CV: RRR Chest: CTA B Abd: minimal periumbilical tenderness with guarding, soft, nondistended, +BS, urostomy bag Ext: no edema  Lab Results: Recent Labs    04/30/19 0028 04/30/19 0028 05/01/19 0500 05/02/19 0420  NA 142   < > 142 140  K 4.9   < > 4.5 4.6  CL 116*   < > 110 113*  CO2 17*   < > 19* 23  GLUCOSE 117*   < > 79 93  BUN 48*   < > 46* 37*  CREATININE 3.06*   < > 2.85* 2.38*  CALCIUM 7.3*   < > 6.7* 7.0*  MG 2.2  --  2.0  --    < > = values in this interval not displayed.   Recent Labs    04/30/19 0028  AST 96*  ALT 117*  ALKPHOS 130*  BILITOT 0.8  PROT 4.8*  ALBUMIN 2.1*   Recent Labs    05/01/19 0500 05/02/19 0420  WBC 9.5 9.3  HGB 7.6* 6.7*  HCT 23.7* 21.3*  MCV 101.3* 102.9*  PLT 137* 146*      Assessment/Plan: Cholangitis with enterococcal bacteremia on IV Abx. S/P CBD stone removal and sphincterotomy on 04/27/19.  Black stools resolving. If rebleeding occurs, then may need a bleeding scan vs EGD to evaluate sphincterotomy. Post-transfusion H/H pending. Continue carb modified diet. Dr. Cristina Gong to f/u tomorrow.   [name] 05/02/2019, 3:44 PM  Questions please call [phone] ID: MAKANA ROSTAD, male   DOB: 12-22-1921, 86 y.o.   MRN: [id number]

## 2019-05-03 DIAGNOSIS — J9601 Acute respiratory failure with hypoxia: Secondary | ICD-10-CM

## 2019-05-03 LAB — COMPREHENSIVE METABOLIC PANEL
ALT: 44 U/L (ref 0–44)
AST: 26 U/L (ref 15–41)
Albumin: 2.1 g/dL — ABNORMAL LOW (ref 3.5–5.0)
Alkaline Phosphatase: 99 U/L (ref 38–126)
Anion gap: 6 (ref 5–15)
BUN: 30 mg/dL — ABNORMAL HIGH (ref 8–23)
CO2: 21 mmol/L — ABNORMAL LOW (ref 22–32)
Calcium: 7.1 mg/dL — ABNORMAL LOW (ref 8.9–10.3)
Chloride: 115 mmol/L — ABNORMAL HIGH (ref 98–111)
Creatinine, Ser: 2.09 mg/dL — ABNORMAL HIGH (ref 0.61–1.24)
GFR calc Af Amer: 30 mL/min — ABNORMAL LOW (ref >60)
GFR calc non Af Amer: 26 mL/min — ABNORMAL LOW (ref >60)
Glucose, Bld: 91 mg/dL (ref 70–99)
Potassium: 4.4 mmol/L (ref 3.5–5.1)
Sodium: 142 mmol/L (ref 135–145)
Total Bilirubin: 0.7 mg/dL (ref 0.3–1.2)
Total Protein: 5 g/dL — ABNORMAL LOW (ref 6.5–8.1)

## 2019-05-03 LAB — CBC
HCT: 26.2 % — ABNORMAL LOW (ref 39.0–52.0)
Hemoglobin: 8.2 g/dL — ABNORMAL LOW (ref 13.0–17.0)
MCH: 31.9 pg (ref 26.0–34.0)
MCHC: 31.3 g/dL (ref 30.0–36.0)
MCV: 101.9 fL — ABNORMAL HIGH (ref 80.0–100.0)
Platelets: 151 10*3/uL (ref 150–400)
RBC: 2.57 MIL/uL — ABNORMAL LOW (ref 4.22–5.81)
RDW: 16.3 % — ABNORMAL HIGH (ref 11.5–15.5)
WBC: 9 10*3/uL (ref 4.0–10.5)
nRBC: 0 % (ref 0.0–0.2)

## 2019-05-03 LAB — TYPE AND SCREEN
ABO/RH(D): A POS
Antibody Screen: NEGATIVE
Unit division: 0
Unit division: 0

## 2019-05-03 LAB — BPAM RBC
Blood Product Expiration Date: 202102242359
Blood Product Expiration Date: 202102242359
ISSUE DATE / TIME: 202102051108
ISSUE DATE / TIME: 202102070942
Unit Type and Rh: 6200
Unit Type and Rh: 6200

## 2019-05-03 MED ORDER — FUROSEMIDE 10 MG/ML IJ SOLN
20.0000 mg | Freq: Once | INTRAMUSCULAR | Status: AC
  Administered 2019-05-03: 20 mg via INTRAVENOUS
  Filled 2019-05-03: qty 2

## 2019-05-03 MED ORDER — PANTOPRAZOLE SODIUM 40 MG PO TBEC
40.0000 mg | DELAYED_RELEASE_TABLET | Freq: Two times a day (BID) | ORAL | Status: DC
  Administered 2019-05-03 – 2019-05-05 (×4): 40 mg via ORAL
  Filled 2019-05-03 (×4): qty 1

## 2019-05-03 MED ORDER — FUROSEMIDE 10 MG/ML IJ SOLN
20.0000 mg | Freq: Two times a day (BID) | INTRAMUSCULAR | Status: DC
  Administered 2019-05-03 – 2019-05-05 (×4): 20 mg via INTRAVENOUS
  Filled 2019-05-03 (×4): qty 2

## 2019-05-03 MED ORDER — ALBUTEROL SULFATE (2.5 MG/3ML) 0.083% IN NEBU
2.5000 mg | INHALATION_SOLUTION | Freq: Once | RESPIRATORY_TRACT | Status: AC
  Administered 2019-05-03: 2.5 mg via RESPIRATORY_TRACT
  Filled 2019-05-03: qty 3

## 2019-05-03 NOTE — Progress Notes (Addendum)
PROGRESS NOTE    [name] [name]    Code Status: DNR  BMS:[id number] DOB: 05-17-1921 DOA: 04/25/2019  PCP: Crist Infante, MD    Hospital Summary  This is a 86 year old male with history of prostate and bladder cancer in 2006 with ileal conduit/urethral stricture, sinus node dysfunction status post PPM dual-chamber, aortic stenosis, gout, DVT who presented to [facility] with fever and left flank pain on 1/31 and shortness of breath, admitted for sepsis and found to have dural coccal bacteremia as well as found to have nonobstructing left renal stone and found to have elevated LFTs, Noncon CT showed CBD stones and intrahepatic and extrahepatic biliary dilation.  GI was consulted and he underwent ERCP with sphincterotomy with Dr. Watt Climes on 2/2.  For bacteremia, ID was consulted and recommended TTE and continue Zosyn.  At family's request, palliative care was consulted for Perry discussion who recommended home-based palliative care at discharge.  2/4 Acute blood loss anemia secondary to GI bleed. GI made aware Cardiology consulted for possible TEE and eval of severe AS  2/5 Hb 6.4. 1 u PRBCs transfused  2/7: Hb 6.7, 1 unit PRBCs transfused  A & P   Principal Problem:   Enterococcal bacteremia Active Problems:   Hypertension   Pacemaker   Aortic stenosis   CKD (chronic kidney disease), stage IV (HCC)   Acute lower UTI   Sepsis (HCC)   Bladder cancer (HCC)   Acute on chronic diastolic CHF (congestive heart failure) (HCC)   Abnormal liver function   Choledocholithiasis   Status post ileal conduit (HCC)   Macrocytic anemia   HOH (hard of hearing)   History of prostate cancer   1. Choledocholithiasis/cholangitis status post ERCP and sphincterotomy 04/27/2019 with Dr. Watt Climes a. Stable b. GI on board 2. Acute respiratory failure with hypoxia secondary to volume overload with wheezing a. Albuterol nebulizer b. Diuresis 3. Acute Blood loss Anemia secondary to GI bleed, diverticular source  more likely vs less likely sphincterotomy source a. Status post 1 unit PRBC 2/5, additional unit PRBC 2/7 for Hb 6.7 b. Hb improved and stable today at 8.2, discontinue bleeding scan advance diet c. Change PPI to p.o. to limit volume d. Hold aspirin, Lovenox, continue SCDs 4. Sepsis on admission, sepsis has resolved a. Suspect multifactorial: UTI unknown organism, Enterococcus bacteremia, cholangitis b. Zosyn-> renally dosed ampicillin and ceftriaxone per ID c. Patient and daughter do not wish to proceed with TEE, will likely need to treat as if he does have endocarditis d. PICC placed  5. Episode of bigeminy during PICC placement a. Has resolved on telemetry today 6. Enterococcus casseliflavus bacteremia a. Zosyn-> renally dosed ampicillin and ceftriaxone per ID b. TTE without vegetations c. No TEE as above 7. Severe aortic stenosis a. worsened from previous echo on 01/28/2019 b. Not a candidate for repair c. Palliative care consulted 8. Acute on chronic HFrEF a. EF similar to prior, 20 to 25% b. Weight up, 68.9 on admission.  not been obtained over the past 2 days c. increased edema and O2 requirement today d. Continue beta-blocker e. Daily weights f. Continue telemetry g. Lasix 20 mg IV twice daily, cautious diuresis with AS 9. Edematous upper extremities a. Keep arms elevated above heart level 10. Sick sinus syndrome status post PPM 11. Elevated troponin, suspect demand ischemia, stable. on telemetry 12. AKI on CKD 4, suspect multifactorial: cardiorenal, blood loss anemia a. Creatinine below baseline (2.2-2.3), currently 2.09 13. Hyperkalemia a. Improved with diuresis 14. Hypothyroid stable on  levothyroxine 15. Gout Continue current regimen 16. Bladder/prostate cancer history with ileal conduit  DVT prophylaxis: Holding Lovenox, added SCDs Family Communication: Called patient's daughter without response Disposition Plan: Hemoglobin stable, significantly volume overloaded  today, continue IV diuresis.  Consultants  GI ID Cardiology Palliative care  Procedures  ERCP with sphincterotomy 04/27/2019 1 unit PRBCs on 2/5 1 unit PRBCs 2/7  Antibiotics   Anti-infectives (From admission, onward)   Start     Dose/Rate Route Frequency Ordered Stop   05/02/19 1200  ampicillin (OMNIPEN) 2 g in sodium chloride 0.9 % 100 mL IVPB     2 g 300 mL/hr over 20 Minutes Intravenous Every 8 hours 05/02/19 0847     04/30/19 1600  ampicillin (OMNIPEN) 2 g in sodium chloride 0.9 % 100 mL IVPB  Status:  Discontinued     2 g 300 mL/hr over 20 Minutes Intravenous Every 12 hours 04/30/19 1123 05/02/19 0847   04/30/19 1200  cefTRIAXone (ROCEPHIN) 2 g in sodium chloride 0.9 % 100 mL IVPB     2 g 200 mL/hr over 30 Minutes Intravenous Every 12 hours 04/30/19 1123     04/27/19 2100  vancomycin (VANCOCIN) IVPB 1000 mg/200 mL premix  Status:  Discontinued     1,000 mg 200 mL/hr over 60 Minutes Intravenous Every 48 hours 04/25/19 2129 04/26/19 1808   04/26/19 0200  piperacillin-tazobactam (ZOSYN) IVPB 2.25 g  Status:  Discontinued     2.25 g 100 mL/hr over 30 Minutes Intravenous Every 6 hours 04/25/19 2129 04/30/19 1123   04/25/19 2045  vancomycin (VANCOCIN) IVPB 1000 mg/200 mL premix     1,000 mg 200 mL/hr over 60 Minutes Intravenous  Once 04/25/19 2040 04/26/19 0028   04/25/19 1915  piperacillin-tazobactam (ZOSYN) IVPB 3.375 g     3.375 g 100 mL/hr over 30 Minutes Intravenous  Once 04/25/19 1911 04/25/19 2102           Subjective   Complaining of some shortness of breath today.  Patient on nasal cannula.  Otherwise he denies any complaints.  Objective   Vitals:   05/03/19 0419 05/03/19 1019 05/03/19 1105 05/03/19 1252  BP: (!) 161/69 (!) 193/52  (!) 157/44  Pulse: (!) 58 (!) 33 63 60  Resp: 17 20  18   Temp: 98.1 F (36.7 C) 98.1 F (36.7 C)  (!) 97.3 F (36.3 C)  TempSrc:  Oral  Oral  SpO2: 97% 100%  100%  Weight:      Height:        Intake/Output Summary  (Last 24 hours) at 05/03/2019 1339 Last data filed at 05/03/2019 1038 Gross per 24 hour  Intake 2477.3 ml  Output 1375 ml  Net 1102.3 ml   Filed Weights   04/28/19 0500 04/29/19 2104 05/01/19 0500  Weight: 72.3 kg 72.2 kg 73.8 kg    Examination:  Physical Exam Vitals and nursing note reviewed.  HENT:     Mouth/Throat:     Mouth: Mucous membranes are moist.  Eyes:     Conjunctiva/sclera: Conjunctivae normal.  Cardiovascular:     Rate and Rhythm: Normal rate.     Heart sounds: Murmur present.  Pulmonary:     Effort: Respiratory distress present.     Breath sounds: Wheezing and rales present.     Comments: Increased work of breathing and accessory muscle use Abdominal:     General: There is no distension.  Musculoskeletal:     Comments: Increased bilateral upper extremity swelling  Neurological:  Mental Status: He is alert. Mental status is at baseline.  Psychiatric:        Mood and Affect: Mood normal.        Behavior: Behavior normal.     Data Reviewed: I have personally reviewed following labs and imaging studies  CBC: Recent Labs  Lab 04/27/19 0419 04/28/19 0449 04/29/19 1135 04/29/19 1801 04/30/19 0028 04/30/19 0802 05/01/19 0500 05/02/19 0420 05/02/19 1617 05/02/19 2200 05/03/19 0350  WBC 10.5   < > 7.4  --  6.2  --  9.5 9.3  --   --  9.0  NEUTROABS 8.6*  --   --   --   --   --   --   --   --   --   --   HGB 9.6*   < > 7.7*   < > 7.0*   < > 7.6* 6.7* 8.4* 7.9* 8.2*  HCT 30.4*   < > 24.3*   < > 23.1*   < > 23.7* 21.3* 26.0* 25.4* 26.2*  MCV 101.7*   < > 99.6  --  105.5*  --  101.3* 102.9*  --   --  101.9*  PLT 157   < > 172  --  166  --  137* 146*  --   --  151   < > = values in this interval not displayed.   Basic Metabolic Panel: Recent Labs  Lab 04/29/19 0521 04/30/19 0028 05/01/19 0500 05/02/19 0420 05/03/19 0350  NA 139 142 142 140 142  K 5.5* 4.9 4.5 4.6 4.4  CL 112* 116* 110 113* 115*  CO2 19* 17* 19* 23 21*  GLUCOSE 90 117* 79 93 91   BUN 49* 48* 46* 37* 30*  CREATININE 2.72* 3.06* 2.85* 2.38* 2.09*  CALCIUM 7.4* 7.3* 6.7* 7.0* 7.1*  MG  --  2.2 2.0  --   --    GFR: Estimated Creatinine Clearance: 19.8 mL/min (A) (by C-G formula based on SCr of 2.09 mg/dL (H)). Liver Function Tests: Recent Labs  Lab 04/27/19 0419 04/28/19 0449 04/29/19 0521 04/30/19 0028 05/03/19 0350  AST 231* 142* 151* 96* 26  ALT 196* 162* 144* 117* 44  ALKPHOS 137* 152* 142* 130* 99  BILITOT 3.3* 3.0* 2.3* 0.8 0.7  PROT 5.9* 6.3* 5.5* 4.8* 5.0*  ALBUMIN 2.6* 2.5* 2.2* 2.1* 2.1*   No results for input(s): LIPASE, AMYLASE in the last 168 hours. No results for input(s): AMMONIA in the last 168 hours. Coagulation Profile: No results for input(s): INR, PROTIME in the last 168 hours. Cardiac Enzymes: No results for input(s): CKTOTAL, CKMB, CKMBINDEX, TROPONINI in the last 168 hours. BNP (last 3 results) No results for input(s): PROBNP in the last 8760 hours. HbA1C: No results for input(s): HGBA1C in the last 72 hours. CBG: Recent Labs  Lab 05/02/19 2025  GLUCAP 126*   Lipid Profile: No results for input(s): CHOL, HDL, LDLCALC, TRIG, CHOLHDL, LDLDIRECT in the last 72 hours. Thyroid Function Tests: No results for input(s): TSH, T4TOTAL, FREET4, T3FREE, THYROIDAB in the last 72 hours. Anemia Panel: No results for input(s): VITAMINB12, FOLATE, FERRITIN, TIBC, IRON, RETICCTPCT in the last 72 hours. Sepsis Labs: No results for input(s): PROCALCITON, LATICACIDVEN in the last 168 hours.  Recent Results (from the past 240 hour(s))  Urine Culture     Status: Abnormal   Collection Time: 04/25/19  5:47 PM   Specimen: Urine, Random  Result Value Ref Range Status   Specimen Description   Final  URINE, RANDOM Performed at [facility], [place] [address] [place], [place] [address]    Special Requests   Final    RIGHT NEPHROSTMY Performed at [facility] [address], [place], [place] [address]     Culture MULTIPLE SPECIES PRESENT, SUGGEST RECOLLECTION (A)  Final   Report Status 04/26/2019 FINAL  Final  Urine culture     Status: Abnormal   Collection Time: 04/25/19  5:49 PM   Specimen: Urine, Random  Result Value Ref Range Status   Specimen Description   Final    URINE, RANDOM Performed at [facility] [address], [place], [place] [address]    Special Requests   Final    NONE Performed at [facility], [place] [address], [place], [place] [address]    Culture MULTIPLE SPECIES PRESENT, SUGGEST RECOLLECTION (A)  Final   Report Status 04/26/2019 FINAL  Final  Respiratory Panel by RT PCR (Flu A&B, Covid) - Nasopharyngeal Swab     Status: None   Collection Time: 04/25/19  6:53 PM   Specimen: Nasopharyngeal Swab  Result Value Ref Range Status   SARS Coronavirus 2 by RT PCR NEGATIVE NEGATIVE Final    Comment: (NOTE) SARS-CoV-2 target nucleic acids are NOT DETECTED. The SARS-CoV-2 RNA is generally detectable in upper respiratoy specimens during the acute phase of infection. The lowest concentration of SARS-CoV-2 viral copies this assay can detect is 131 copies/mL. A negative result does not preclude SARS-Cov-2 infection and should not be used as the sole basis for treatment or other patient management decisions. A negative result may occur with  improper specimen collection/handling, submission of specimen other than nasopharyngeal swab, presence of viral mutation(s) within the areas targeted by this assay, and inadequate number of viral copies (<131 copies/mL). A negative result must be combined with clinical observations, patient history, and epidemiological information. The expected result is Negative. Fact Sheet for Patients:  PinkCheek.be Fact Sheet for Healthcare Providers:  GravelBags.it This test is not yet ap proved or cleared by the [place] FDA and  has been authorized  for detection and/or diagnosis of SARS-CoV-2 by FDA under an Emergency Use Authorization (EUA). This EUA will remain  in effect (meaning this test can be used) for the duration of the COVID-19 declaration under Section 564(b)(1) of the Act, 21 U.S.C. section 360bbb-3(b)(1), unless the authorization is terminated or revoked sooner.    Influenza A by PCR NEGATIVE NEGATIVE Final   Influenza B by PCR NEGATIVE NEGATIVE Final    Comment: (NOTE) The Xpert Xpress SARS-CoV-2/FLU/RSV assay is intended as an aid in  the diagnosis of influenza from Nasopharyngeal swab specimens and  should not be used as a sole basis for treatment. Nasal washings and  aspirates are unacceptable for Xpert Xpress SARS-CoV-2/FLU/RSV  testing. Fact Sheet for Patients: PinkCheek.be Fact Sheet for Healthcare Providers: GravelBags.it This test is not yet approved or cleared by the [place] FDA and  has been authorized for detection and/or diagnosis of SARS-CoV-2 by  FDA under an Emergency Use Authorization (EUA). This EUA will remain  in effect (meaning this test can be used) for the duration of the  Covid-19 declaration under Section 564(b)(1) of the Act, 21  U.S.C. section 360bbb-3(b)(1), unless the authorization is  terminated or revoked. Performed at [facility], [place] [address], [place], [place] [address]   Blood culture (routine x 2)     Status: Abnormal   Collection Time: 04/25/19  8:29  PM   Specimen: BLOOD  Result Value Ref Range Status   Specimen Description BLOOD LEFT ANTECUBITAL  Final   Special Requests   Final    BOTTLES DRAWN AEROBIC AND ANAEROBIC Blood Culture adequate volume Performed at [facility] [address], [place], [place] [address]    Culture  Setup Time   Final    GRAM POSITIVE COCCI IN BOTH AEROBIC AND ANAEROBIC BOTTLES CRITICAL RESULT CALLED TO, READ BACK BY AND VERIFIED WITH:  JUSTIN LEGGE @1455  04/26/19 AKT    Culture (A)  Final    ENTEROCOCCUS CASSELIFLAVUS SUSCEPTIBILITIES PERFORMED ON PREVIOUS CULTURE WITHIN THE LAST 5 DAYS. Performed at [facility], [place] [address], [place], [place] [address]    Report Status 04/28/2019 FINAL  Final  Blood culture (routine x 2)     Status: Abnormal   Collection Time: 04/25/19  8:33 PM   Specimen: BLOOD RIGHT FOREARM  Result Value Ref Range Status   Specimen Description BLOOD RIGHT FOREARM  Final   Special Requests   Final    BOTTLES DRAWN AEROBIC AND ANAEROBIC Blood Culture adequate volume Performed at [facility] [address] [place], [place] [address]    Culture  Setup Time   Final    GRAM POSITIVE COCCI IN BOTH AEROBIC AND ANAEROBIC BOTTLES CRITICAL VALUE NOTED.  VALUE IS CONSISTENT WITH PREVIOUSLY REPORTED AND CALLED VALUE. AKT Organism ID to follow CRITICAL RESULT CALLED TO, READ BACK BY AND VERIFIED WITH: Christean Grief [facility] 04/26/19 1750 JDW Performed at [facility], [place] [address] [place],  [address]    Culture ENTEROCOCCUS CASSELIFLAVUS (A)  Final   Report Status 04/28/2019 FINAL  Final   Organism ID, Bacteria ENTEROCOCCUS CASSELIFLAVUS  Final      Susceptibility   Enterococcus casseliflavus - MIC*    AMPICILLIN <=2 SENSITIVE Sensitive     VANCOMYCIN RESISTANT Resistant     GENTAMICIN SYNERGY SENSITIVE Sensitive     LINEZOLID 2 SENSITIVE Sensitive     * ENTEROCOCCUS CASSELIFLAVUS  Blood Culture ID Panel (Reflexed)     Status: Abnormal   Collection Time: 04/25/19  8:33 PM  Result Value Ref Range Status   Enterococcus species DETECTED (A) NOT DETECTED Final    Comment: CRITICAL RESULT CALLED TO, READ BACK BY AND VERIFIED WITH: J LEGGE PHARMD 04/26/19 1750 JDW    Vancomycin resistance NOT DETECTED NOT DETECTED Final   Listeria monocytogenes NOT DETECTED NOT DETECTED Final   Staphylococcus species NOT DETECTED NOT DETECTED Final   Staphylococcus aureus (BCID) NOT  DETECTED NOT DETECTED Final   Streptococcus species NOT DETECTED NOT DETECTED Final   Streptococcus agalactiae NOT DETECTED NOT DETECTED Final   Streptococcus pneumoniae NOT DETECTED NOT DETECTED Final   Streptococcus pyogenes NOT DETECTED NOT DETECTED Final   Acinetobacter baumannii NOT DETECTED NOT DETECTED Final   Enterobacteriaceae species NOT DETECTED NOT DETECTED Final   Enterobacter cloacae complex NOT DETECTED NOT DETECTED Final   Escherichia coli NOT DETECTED NOT DETECTED Final   Klebsiella oxytoca NOT DETECTED NOT DETECTED Final   Klebsiella pneumoniae NOT DETECTED NOT DETECTED Final   Proteus species NOT DETECTED NOT DETECTED Final   Serratia marcescens NOT DETECTED NOT DETECTED Final   Haemophilus influenzae NOT DETECTED NOT DETECTED Final   Neisseria meningitidis NOT DETECTED NOT DETECTED Final   Pseudomonas aeruginosa NOT DETECTED NOT DETECTED Final   Candida albicans NOT DETECTED NOT DETECTED Final   Candida glabrata NOT DETECTED NOT DETECTED Final  Candida krusei NOT DETECTED NOT DETECTED Final   Candida parapsilosis NOT DETECTED NOT DETECTED Final   Candida tropicalis NOT DETECTED NOT DETECTED Final    Comment: Performed at [facility], [place] [address] [place], [place] [address]  Culture, blood (routine x 2)     Status: None   Collection Time: 04/27/19 10:14 AM   Specimen: BLOOD LEFT HAND  Result Value Ref Range Status   Specimen Description   Final    BLOOD LEFT HAND Performed at [facility] [address], [place], [place] [address]    Special Requests   Final    BOTTLES DRAWN AEROBIC ONLY Blood Culture adequate volume Performed at [facility] [address], [place], [place] [address]    Culture   Final    NO GROWTH 5 DAYS Performed at [facility], [place] [address], [place], [place] [address]    Report Status 05/02/2019 FINAL  Final  Culture, blood (routine x 2)     Status: None   Collection Time:  04/27/19 10:14 AM   Specimen: BLOOD  Result Value Ref Range Status   Specimen Description   Final    BLOOD LEFT ARM Performed at [facility] [address] [place], [place] [address]    Special Requests   Final    BOTTLES DRAWN AEROBIC ONLY Blood Culture adequate volume Performed at [facility] [address] [place], [place] [address]    Culture   Final    NO GROWTH 5 DAYS Performed at [facility], [place] [address], [place],  [address]    Report Status 05/02/2019 FINAL  Final         Radiology Studies: No results found.      Scheduled Meds: . Chlorhexidine Gluconate Cloth  6 each Topical Daily  . Chlorhexidine Gluconate Cloth  6 each Topical Daily  . cholecalciferol  1,000 Units Oral Daily  . docusate sodium  300 mg Oral Daily  . febuxostat  40 mg Oral Daily  . furosemide  20 mg Intravenous BID  . levothyroxine  25 mcg Oral Q0600  . metoprolol succinate  12.5 mg Oral Daily  . multivitamin  1 tablet Oral Daily  . pantoprazole (PROTONIX) IV  40 mg Intravenous Q12H  . sodium chloride flush  10-40 mL Intracatheter Q12H   Continuous Infusions: . sodium chloride Stopped (04/29/19 1820)  . ampicillin (OMNIPEN) IV Stopped (05/03/19 0424)  . cefTRIAXone (ROCEPHIN)  IV Stopped (05/03/19 0459)     LOS: 8 days    Time spent: 30 minutes with over 50% of the time coordinating the patient's care    Harold Hedge, DO [facility] Pager [phone]  If 7PM-7AM, please contact night-coverage [email or web address] Password [email or web address] 05/03/2019, 1:39 PM

## 2019-05-03 NOTE — Progress Notes (Addendum)
Physical Therapy Treatment Patient Details Name: [name] [name] MRN: [id number] DOB: 1921-09-23 Today's Date: 05/03/2019    History of Present Illness 86 yo male admitted with UTI, possible cholelithiasis.  Pt found to have cholangitis with enterococcal bacteremia s/p choledocholithiasis removal and sphincterotomy (04/27/19) and developed rectal bleeding (maroon with clots), s/p 1 unit PRBCs 04/30/19.  Hx of aortic stenosis, CHF, pacemaker, prostate, Ca, bladder Ca, macular degeneration, DVT    PT Comments    Daughter reports pt has not been OOB over the weekend. Some concern about how he may perform, but he did fairly well. Noted bil UE edema and audible wheezing. Unable to assess O2-no dynamap available on unit. Will continue to follow and progress as able.     Follow Up Recommendations  Home health PT;Supervision/Assistance - 24 hour     Equipment Recommendations  None recommended by PT    Recommendations for Other Services       Precautions / Restrictions Precautions Precautions: Fall Precaution Comments: R LQ urostomy, R nephrostomy Restrictions Weight Bearing Restrictions: No    Mobility  Bed Mobility Overal bed mobility: Needs Assistance Bed Mobility: Supine to Sit     Supine to sit: Min assist;HOB elevated     General bed mobility comments: Assist for trunk. Increased time.  Transfers Overall transfer level: Needs assistance Equipment used: Rolling walker (2 wheeled) Transfers: Sit to/from Stand Sit to Stand: Min assist;From elevated surface         General transfer comment: Assist to power up. Cues for hand placement.  Ambulation/Gait Ambulation/Gait assistance: Min assist;Min guard Gait Distance (Feet): 115 Feet Assistive device: Rolling walker (2 wheeled) Gait Pattern/deviations: Step-through pattern     General Gait Details: Small amount of assist to steady intermittently but mostly min guard. Dyspnea 2/4   Stairs             Wheelchair  Mobility    Modified Rankin (Stroke Patients Only)       Balance Overall balance assessment: Needs assistance         Standing balance support: Bilateral upper extremity supported Standing balance-Leahy Scale: Poor                              Cognition Arousal/Alertness: Awake/alert Behavior During Therapy: WFL for tasks assessed/performed Overall Cognitive Status: Within Functional Limits for tasks assessed                                        Exercises      General Comments        Pertinent Vitals/Pain Pain Assessment: Faces Faces Pain Scale: Hurts a little bit Pain Location: lower L abd Pain Descriptors / Indicators: Sore;Discomfort Pain Intervention(s): Monitored during session;Repositioned    Home Living                      Prior Function            PT Goals (current goals can now be found in the care plan section) Progress towards PT goals: Progressing toward goals    Frequency    Min 3X/week      PT Plan Current plan remains appropriate    Co-evaluation              AM-PAC PT "6 Clicks" Mobility   Outcome Measure  Help needed  turning from your back to your side while in a flat bed without using bedrails?: A Little Help needed moving from lying on your back to sitting on the side of a flat bed without using bedrails?: A Little Help needed moving to and from a bed to a chair (including a wheelchair)?: A Little Help needed standing up from a chair using your arms (e.g., wheelchair or bedside chair)?: A Little Help needed to walk in hospital room?: A Little Help needed climbing 3-5 steps with a railing? : A Little 6 Click Score: 18    End of Session Equipment Utilized During Treatment: Gait belt Activity Tolerance: Patient tolerated treatment well Patient left: in chair;with call bell/phone within reach;with family/visitor present   PT Visit Diagnosis: Unsteadiness on feet (R26.81);Muscle  weakness (generalized) (M62.81)     Time: 6759-1638 PT Time Calculation (min) (ACUTE ONLY): 21 min  Charges:  $Gait Training: 8-22 mins                        Doreatha Massed, PT Acute Rehabilitation

## 2019-05-03 NOTE — Progress Notes (Signed)
Patient ID: [name] [name], male   DOB: [birth year], 86 y.o.   MRN: [id number]         [facility]  Date of Admission:  04/25/2019           Day 8 antibiotics        Day 4 ampicillin and ceftriaxone ASSESSMENT: He is improving on therapy for cholangitis complicated by enterococcal bacteremia.  In discussion with [name] [name] and his daughter we have decided to treat for possible enterococcal endocarditis rather than pursuing TEE.  His renal function has improved and his ampicillin has been increased to every 8 hour dosing.  His daughter feels that she can manage that as an outpatient.  PLAN: 1. Continue IV ampicillin (renally dosed) and ceftriaxone 2. I will sign off now  Diagnosis: Bacteremia  Culture Result: Enterococcus  No Known Allergies  OPAT Orders Discharge antibiotics: Per pharmacy protocol ampicillin and ceftriaxone  Duration: 4 weeks minimum End Date: 05/23/2019  [facility] Care Per Protocol:  Home health RN for IV administration and teaching; PICC line care and labs.    Labs weekly while on IV antibiotics: __ CBC with differential __ BMP __ CMP __ CRP __ ESR __ Vancomycin trough __ CK  __ Please pull PIC at completion of IV antibiotics __ Please leave PIC in place until doctor has seen patient or been notified  Fax weekly labs to [phone]  Clinic Follow Up Appt: 05/19/2019  Principal Problem:   Enterococcal bacteremia Active Problems:   Acute lower UTI   Sepsis ([place])   Choledocholithiasis   Hypertension   Pacemaker   Aortic stenosis   CKD (chronic kidney disease), stage IV (HCC)   Bladder cancer ([place])   Acute on chronic diastolic CHF (congestive heart failure) (HCC)   Abnormal liver function   Status post ileal conduit (HCC)   Macrocytic anemia   HOH (hard of hearing)   History of prostate cancer   Scheduled Meds: . Chlorhexidine Gluconate Cloth  6 each Topical Daily  . Chlorhexidine Gluconate Cloth  6  each Topical Daily  . cholecalciferol  1,000 Units Oral Daily  . docusate sodium  300 mg Oral Daily  . febuxostat  40 mg Oral Daily  . furosemide  20 mg Intravenous BID  . levothyroxine  25 mcg Oral Q0600  . metoprolol succinate  12.5 mg Oral Daily  . multivitamin  1 tablet Oral Daily  . pantoprazole  40 mg Oral BID  . sodium chloride flush  10-40 mL Intracatheter Q12H   Continuous Infusions: . sodium chloride Stopped (04/29/19 1820)  . ampicillin (OMNIPEN) IV 2 g (05/03/19 1343)  . cefTRIAXone (ROCEPHIN)  IV Stopped (05/03/19 0459)   PRN Meds:.sodium chloride, colchicine, ondansetron (ZOFRAN) IV, polyethylene glycol, polyvinyl alcohol, sodium chloride flush, traMADol   SUBJECTIVE: He tells me that he is feeling better.  He is eager to go home soon.  Review of Systems: Review of Systems  Constitutional: Negative for fever.  Respiratory: Negative for cough and shortness of breath.   Cardiovascular: Negative for chest pain.  Gastrointestinal: Negative for abdominal pain, blood in stool, diarrhea, nausea and vomiting.  Musculoskeletal: Negative for back pain.    No Known Allergies  OBJECTIVE: Vitals:   05/03/19 0419 05/03/19 1019 05/03/19 1105 05/03/19 1252  BP: (!) 161/69 (!) 193/52  (!) 157/44  Pulse: (!) 58 (!) 33 63 60  Resp: '17 20  18  '$ Temp: 98.1 F (36.7 C) 98.1 F (36.7 C)  (!)  97.3 F (36.3 C)  TempSrc:  Oral  Oral  SpO2: 97% 100%  100%  Weight:      Height:       Body mass index is 25.48 kg/m.  Physical Exam Constitutional:      Comments: He is resting comfortably in bed.  His daughter is present.  Cardiovascular:     Rate and Rhythm: Normal rate and regular rhythm.     Heart sounds: Murmur present.     Comments: Pacemaker site is okay. Pulmonary:     Effort: Pulmonary effort is normal.     Breath sounds: Normal breath sounds.  Abdominal:     Palpations: Abdomen is soft.     Tenderness: There is no abdominal tenderness.     Lab Results Lab  Results  Component Value Date   WBC 9.0 05/03/2019   HGB 8.2 (L) 05/03/2019   HCT 26.2 (L) 05/03/2019   MCV 101.9 (H) 05/03/2019   PLT 151 05/03/2019    Lab Results  Component Value Date   CREATININE 2.09 (H) 05/03/2019   BUN 30 (H) 05/03/2019   NA 142 05/03/2019   K 4.4 05/03/2019   CL 115 (H) 05/03/2019   CO2 21 (L) 05/03/2019    Lab Results  Component Value Date   ALT 44 05/03/2019   AST 26 05/03/2019   ALKPHOS 99 05/03/2019   BILITOT 0.7 05/03/2019     Microbiology: Recent Results (from the past 240 hour(s))  Urine Culture     Status: Abnormal   Collection Time: 04/25/19  5:47 PM   Specimen: Urine, Random  Result Value Ref Range Status   Specimen Description   Final    URINE, RANDOM Performed at [facility], [place] [address], [place], [place] [address]    Special Requests   Final    RIGHT NEPHROSTMY Performed at [facility] [address], [place], [place] [address]    Culture MULTIPLE SPECIES PRESENT, SUGGEST RECOLLECTION (A)  Final   Report Status 04/26/2019 FINAL  Final  Urine culture     Status: Abnormal   Collection Time: 04/25/19  5:49 PM   Specimen: Urine, Random  Result Value Ref Range Status   Specimen Description   Final    URINE, RANDOM Performed at [facility] [address] [place], [place] [address]    Special Requests   Final    NONE Performed at [facility], [place] [address] [place], [place] [address]    Culture MULTIPLE SPECIES PRESENT, SUGGEST RECOLLECTION (A)  Final   Report Status 04/26/2019 FINAL  Final  Respiratory Panel by RT PCR (Flu A&B, Covid) - Nasopharyngeal Swab     Status: None   Collection Time: 04/25/19  6:53 PM   Specimen: Nasopharyngeal Swab  Result Value Ref Range Status   SARS Coronavirus 2 by RT PCR NEGATIVE NEGATIVE Final    Comment: (NOTE) SARS-CoV-2 target nucleic acids are NOT DETECTED. The SARS-CoV-2 RNA is generally detectable in  upper respiratoy specimens during the acute phase of infection. The lowest concentration of SARS-CoV-2 viral copies this assay can detect is 131 copies/mL. A negative result does not preclude SARS-Cov-2 infection and should not be used as the sole basis for treatment or other patient management decisions. A negative result may occur with  improper specimen collection/handling, submission of specimen other than nasopharyngeal swab, presence of viral mutation(s) within the areas targeted by this assay, and inadequate number of viral copies (<131 copies/mL).  A negative result must be combined with clinical observations, patient history, and epidemiological information. The expected result is Negative. Fact Sheet for Patients:  PinkCheek.be Fact Sheet for Healthcare Providers:  GravelBags.it This test is not yet ap proved or cleared by the [place] FDA and  has been authorized for detection and/or diagnosis of SARS-CoV-2 by FDA under an Emergency Use Authorization (EUA). This EUA will remain  in effect (meaning this test can be used) for the duration of the COVID-19 declaration under Section 564(b)(1) of the Act, 21 U.S.C. section 360bbb-3(b)(1), unless the authorization is terminated or revoked sooner.    Influenza A by PCR NEGATIVE NEGATIVE Final   Influenza B by PCR NEGATIVE NEGATIVE Final    Comment: (NOTE) The Xpert Xpress SARS-CoV-2/FLU/RSV assay is intended as an aid in  the diagnosis of influenza from Nasopharyngeal swab specimens and  should not be used as a sole basis for treatment. Nasal washings and  aspirates are unacceptable for Xpert Xpress SARS-CoV-2/FLU/RSV  testing. Fact Sheet for Patients: PinkCheek.be Fact Sheet for Healthcare Providers: GravelBags.it This test is not yet approved or cleared by the [place] FDA and  has been authorized for  detection and/or diagnosis of SARS-CoV-2 by  FDA under an Emergency Use Authorization (EUA). This EUA will remain  in effect (meaning this test can be used) for the duration of the  Covid-19 declaration under Section 564(b)(1) of the Act, 21  U.S.C. section 360bbb-3(b)(1), unless the authorization is  terminated or revoked. Performed at [facility], [place] [address] [place], [place] [address]   Blood culture (routine x 2)     Status: Abnormal   Collection Time: 04/25/19  8:29 PM   Specimen: BLOOD  Result Value Ref Range Status   Specimen Description BLOOD LEFT ANTECUBITAL  Final   Special Requests   Final    BOTTLES DRAWN AEROBIC AND ANAEROBIC Blood Culture adequate volume Performed at [facility] [address] [place], [place] [address]    Culture  Setup Time   Final    GRAM POSITIVE COCCI IN BOTH AEROBIC AND ANAEROBIC BOTTLES CRITICAL RESULT CALLED TO, READ BACK BY AND VERIFIED WITH: JUSTIN LEGGE '@1455'$  04/26/19 AKT    Culture (A)  Final    ENTEROCOCCUS CASSELIFLAVUS SUSCEPTIBILITIES PERFORMED ON PREVIOUS CULTURE WITHIN THE LAST 5 DAYS. Performed at [facility], [place] [address] [place], [place] [address]    Report Status 04/28/2019 FINAL  Final  Blood culture (routine x 2)     Status: Abnormal   Collection Time: 04/25/19  8:33 PM   Specimen: BLOOD RIGHT FOREARM  Result Value Ref Range Status   Specimen Description BLOOD RIGHT FOREARM  Final   Special Requests   Final    BOTTLES DRAWN AEROBIC AND ANAEROBIC Blood Culture adequate volume Performed at [facility] [address], [place], [place] [address]    Culture  Setup Time   Final    GRAM POSITIVE COCCI IN BOTH AEROBIC AND ANAEROBIC BOTTLES CRITICAL VALUE NOTED.  VALUE IS CONSISTENT WITH PREVIOUSLY REPORTED AND CALLED VALUE. AKT Organism ID to follow CRITICAL RESULT CALLED TO, READ BACK BY AND VERIFIED WITH: Christean Grief [facility] 04/26/19 1750 JDW Performed at  [facility], [place] [address] [place], [place] [address]    Culture ENTEROCOCCUS CASSELIFLAVUS (A)  Final   Report Status 04/28/2019 FINAL  Final   Organism ID, Bacteria ENTEROCOCCUS CASSELIFLAVUS  Final      Susceptibility  Enterococcus casseliflavus - MIC*    AMPICILLIN <=2 SENSITIVE Sensitive     VANCOMYCIN RESISTANT Resistant     GENTAMICIN SYNERGY SENSITIVE Sensitive     LINEZOLID 2 SENSITIVE Sensitive     * ENTEROCOCCUS CASSELIFLAVUS  Blood Culture ID Panel (Reflexed)     Status: Abnormal   Collection Time: 04/25/19  8:33 PM  Result Value Ref Range Status   Enterococcus species DETECTED (A) NOT DETECTED Final    Comment: CRITICAL RESULT CALLED TO, READ BACK BY AND VERIFIED WITH: J LEGGE PHARMD 04/26/19 1750 JDW    Vancomycin resistance NOT DETECTED NOT DETECTED Final   Listeria monocytogenes NOT DETECTED NOT DETECTED Final   Staphylococcus species NOT DETECTED NOT DETECTED Final   Staphylococcus aureus (BCID) NOT DETECTED NOT DETECTED Final   Streptococcus species NOT DETECTED NOT DETECTED Final   Streptococcus agalactiae NOT DETECTED NOT DETECTED Final   Streptococcus pneumoniae NOT DETECTED NOT DETECTED Final   Streptococcus pyogenes NOT DETECTED NOT DETECTED Final   Acinetobacter baumannii NOT DETECTED NOT DETECTED Final   Enterobacteriaceae species NOT DETECTED NOT DETECTED Final   Enterobacter cloacae complex NOT DETECTED NOT DETECTED Final   Escherichia coli NOT DETECTED NOT DETECTED Final   Klebsiella oxytoca NOT DETECTED NOT DETECTED Final   Klebsiella pneumoniae NOT DETECTED NOT DETECTED Final   Proteus species NOT DETECTED NOT DETECTED Final   Serratia marcescens NOT DETECTED NOT DETECTED Final   Haemophilus influenzae NOT DETECTED NOT DETECTED Final   Neisseria meningitidis NOT DETECTED NOT DETECTED Final   Pseudomonas aeruginosa NOT DETECTED NOT DETECTED Final   Candida albicans NOT DETECTED NOT DETECTED Final   Candida glabrata NOT DETECTED NOT DETECTED  Final   Candida krusei NOT DETECTED NOT DETECTED Final   Candida parapsilosis NOT DETECTED NOT DETECTED Final   Candida tropicalis NOT DETECTED NOT DETECTED Final    Comment: Performed at [facility], [address], [place], [place] [address]  Culture, blood (routine x 2)     Status: None   Collection Time: 04/27/19 10:14 AM   Specimen: BLOOD LEFT HAND  Result Value Ref Range Status   Specimen Description   Final    BLOOD LEFT HAND Performed at [facility] [address], [place], [place] [address]    Special Requests   Final    BOTTLES DRAWN AEROBIC ONLY Blood Culture adequate volume Performed at [facility] [address] [place], [place] [address]    Culture   Final    NO GROWTH 5 DAYS Performed at [facility], [place] [address] [place], [place] [address]    Report Status 05/02/2019 FINAL  Final  Culture, blood (routine x 2)     Status: None   Collection Time: 04/27/19 10:14 AM   Specimen: BLOOD  Result Value Ref Range Status   Specimen Description   Final    BLOOD LEFT ARM Performed at [facility] [address] [place], [place] [address]    Special Requests   Final    BOTTLES DRAWN AEROBIC ONLY Blood Culture adequate volume Performed at [facility] [address] [place], [place] [address]    Culture   Final    NO GROWTH 5 DAYS Performed at [facility], [place] [address] [place], [place] [address]    Report Status 05/02/2019 FINAL  Final    Michel Bickers, MD [facility] [phone] pager  [phone] cell 05/03/2019, 4:20 PM

## 2019-05-03 NOTE — Progress Notes (Signed)
Occupational Therapy Treatment Patient Details Name: [name] [name] MRN: [id number] DOB: [birth year] Today's Date: 05/03/2019    History of present illness 86 yo male admitted with UTI, possible cholelithiasis.  Pt found to have cholangitis with enterococcal bacteremia s/p choledocholithiasis removal and sphincterotomy (04/27/19) and developed rectal bleeding (maroon with clots), s/p 1 unit PRBCs 04/30/19.  Hx of aortic stenosis, CHF, pacemaker, prostate, Ca, bladder Ca, macular degeneration, DVT   OT comments  Pt with good participation with OT. Pt  HOH but able to hear if spoken too loudly.  Daughter supportive  Follow Up Recommendations  Home health OT;Supervision/Assistance - 24 hour          Precautions / Restrictions Precautions Precautions: Fall Precaution Comments: R LQ urostomy, R nephrostomy Restrictions Weight Bearing Restrictions: No       Mobility Bed Mobility Overal bed mobility: Needs Assistance Bed Mobility: Supine to Sit     Supine to sit: Min assist;HOB elevated     General bed mobility comments: Assist for trunk. Increased time.  Transfers Overall transfer level: Needs assistance Equipment used: Rolling walker (2 wheeled) Transfers: Sit to/from Stand Sit to Stand: Min assist;From elevated surface         General transfer comment: Assist to power up. Cues for hand placement.    Balance Overall balance assessment: Needs assistance         Standing balance support: Bilateral upper extremity supported Standing balance-Leahy Scale: Poor                             ADL either performed or assessed with clinical judgement   ADL Overall ADL's : Needs assistance/impaired     Grooming: Wash/dry face;Oral care;Set up;Sitting;Standing           Upper Body Dressing : Set up;Sitting   Lower Body Dressing: Minimal assistance;Moderate assistance;Sitting/lateral leans   Toilet Transfer: Minimal assistance;Cueing for safety;Cueing for  sequencing;RW   Toileting- Clothing Manipulation and Hygiene: Moderate assistance;Sit to/from stand;Cueing for safety         General ADL Comments: pt very willing to participate with OT     Vision Patient Visual Report: No change from baseline     Perception     Praxis      Cognition Arousal/Alertness: Awake/alert Behavior During Therapy: WFL for tasks assessed/performed Overall Cognitive Status: Within Functional Limits for tasks assessed                                          Exercises     Shoulder Instructions       General Comments      Pertinent Vitals/ Pain       Pain Assessment: No/denies pain         Frequency  Min 2X/week        Progress Toward Goals  OT Goals(current goals can now be found in the care plan section)  Progress towards OT goals: Progressing toward goals     Plan Discharge plan remains appropriate       AM-PAC OT "6 Clicks" Daily Activity     Outcome Measure   Help from another person eating meals?: None Help from another person taking care of personal grooming?: A Little Help from another person toileting, which includes using toliet, bedpan, or urinal?: A Lot Help from another person bathing (including washing, rinsing,  drying)?: A Little Help from another person to put on and taking off regular upper body clothing?: A Little Help from another person to put on and taking off regular lower body clothing?: A Lot 6 Click Score: 17    End of Session Equipment Utilized During Treatment: Gait belt;Rolling walker  OT Visit Diagnosis: Unsteadiness on feet (R26.81);Muscle weakness (generalized) (M62.81)   Activity Tolerance Patient tolerated treatment well   Patient Left with call bell/phone within reach;in chair;with chair alarm set;with family/visitor present   Nurse Communication Mobility status        Time: 0802-2336 OT Time Calculation (min): 24 min  Charges: OT General Charges $OT Visit: 1  Visit OT Treatments $Self Care/Home Management : 23-37 mins  Kari Baars, [place] [phone] Office- [place], Edwena Felty D 05/03/2019, 7:06 PM

## 2019-05-03 NOTE — Progress Notes (Signed)
Patient is comfortable except for some low abdominal discomfort, nothing severe.  Had nonbloody bowel movement today, per discussion with RN.  Hemoglobin has been stable over past 24 hours since yesterday's transfusion, with an appropriate rise following 1 unit of packed red cells.  Meanwhile, his BUN is dropping progressively toward normal over the past 3 days.  LFTs have normalized.  Exam: No apparent distress.  Chest clear anteriorly bilaterally.  Heart has 3/6 harsh systolic murmur consistent with history of aortic stenosis.  Abdomen without overt tenderness.  Labs: Per above  Impression:   1.  Quiescent GI bleed of indeterminate origin, possibly post sphincterotomy  2.  Posthemorrhagic anemia, improved and stable posttransfusion  3.  Successful resolution of biliary obstruction from stone, status post sphincterotomy and stone extraction, as evidenced by normalization of LFTs.   Recommendations:  1.  Continue high-dose PPI therapy for now  2.  At time of discharge, I think it would be okay to cut back to once daily PPI dosing but I would continue that indefinitely if the patient is felt to be an appropriate candidate for ongoing aspirin therapy  3.  Ideally, unless aspirin therapy is felt to be critical for this patient, I would keep him off aspirin for approximately 1 week from now  Cleotis Nipper, M.D. Pager [phone] If no answer or after 5 PM call [phone]

## 2019-05-04 DIAGNOSIS — I5023 Acute on chronic systolic (congestive) heart failure: Secondary | ICD-10-CM

## 2019-05-04 LAB — CBC
HCT: 26.6 % — ABNORMAL LOW (ref 39.0–52.0)
Hemoglobin: 8.4 g/dL — ABNORMAL LOW (ref 13.0–17.0)
MCH: 32.2 pg (ref 26.0–34.0)
MCHC: 31.6 g/dL (ref 30.0–36.0)
MCV: 101.9 fL — ABNORMAL HIGH (ref 80.0–100.0)
Platelets: 170 10*3/uL (ref 150–400)
RBC: 2.61 MIL/uL — ABNORMAL LOW (ref 4.22–5.81)
RDW: 16.4 % — ABNORMAL HIGH (ref 11.5–15.5)
WBC: 9.7 10*3/uL (ref 4.0–10.5)
nRBC: 0 % (ref 0.0–0.2)

## 2019-05-04 LAB — BASIC METABOLIC PANEL
Anion gap: 5 (ref 5–15)
BUN: 29 mg/dL — ABNORMAL HIGH (ref 8–23)
CO2: 24 mmol/L (ref 22–32)
Calcium: 7.5 mg/dL — ABNORMAL LOW (ref 8.9–10.3)
Chloride: 113 mmol/L — ABNORMAL HIGH (ref 98–111)
Creatinine, Ser: 1.9 mg/dL — ABNORMAL HIGH (ref 0.61–1.24)
GFR calc Af Amer: 34 mL/min — ABNORMAL LOW (ref >60)
GFR calc non Af Amer: 29 mL/min — ABNORMAL LOW (ref >60)
Glucose, Bld: 103 mg/dL — ABNORMAL HIGH (ref 70–99)
Potassium: 3.9 mmol/L (ref 3.5–5.1)
Sodium: 142 mmol/L (ref 135–145)

## 2019-05-04 LAB — MAGNESIUM: Magnesium: 1.9 mg/dL (ref 1.7–2.4)

## 2019-05-04 NOTE — TOC Progression Note (Signed)
Transition of Care [facility]) - Progression Note    Patient Details  Name: [name] [name] MRN: [id number] Date of Birth: [birth year]  Transition of Care [facility]) CM/SW Contact  Mayank Teuscher, Juliann Pulse, RN Phone Number: 05/04/2019, 12:52 PM  Clinical Narrative: Shirley Muscat following for initial instruction on iv abx;[facility] rep Santiago Glad following for HHRNPT/OT-iv abx instruction. Awaiting scripts for iv abx.TC dtr Sybil aware of d/c plan,able to transport home on own.      Expected Discharge Plan: [place] Barriers to Discharge: Continued Medical Work up  Expected Discharge Plan and Services Expected Discharge Plan: [place]   Discharge Planning Services: CM Consult   Living arrangements for the past 2 months: Single Family Home                           HH Arranged: RN, PT, OT, IV Antibiotics HH Agency: [place] ([facility]) Date HH Agency Contacted: 04/28/19 Time [place]: 1502 Representative spoke with at [place]: [place] ([place]) Interventions    Readmission Risk Interventions Readmission Risk Prevention Plan 04/26/2019  Transportation Screening Complete  PCP or Specialist Appt within 3-5 Days Complete  HRI or [place] Complete  Social Work Consult for [place] Planning/Counseling Complete  Palliative Care Screening Complete  Medication Review [employer]) Complete  Some recent data might be hidden

## 2019-05-04 NOTE — Care Management Important Message (Signed)
Important Message  Patient Details IM Letter given to Dessa Phi RN Case Manager to present to the Patient Name: [name] [name] MRN: [id number] Date of Birth: [birth year]   Medicare Important Message Given:  Yes     Kerin Salen 05/04/2019, 12:29 PM

## 2019-05-04 NOTE — Progress Notes (Signed)
PROGRESS NOTE    [name] [name]    Code Status: DNR  GUY:[id number] DOB: 1921-12-20 DOA: 04/25/2019  PCP: Crist Infante, MD    Hospital Summary  This is a 86 year old male with history of prostate and bladder cancer in 2006 with ileal conduit/urethral stricture, sinus node dysfunction status post PPM dual-chamber, aortic stenosis, gout, DVT who presented to [facility] with fever and left flank pain on 1/31 and shortness of breath, admitted for sepsis and found to have dural coccal bacteremia as well as found to have nonobstructing left renal stone and found to have elevated LFTs, Noncon CT showed CBD stones and intrahepatic and extrahepatic biliary dilation.  GI was consulted and he underwent ERCP with sphincterotomy with Dr. Watt Climes on 2/2.  For bacteremia, ID was consulted and recommended TTE and continue Zosyn.  At family's request, palliative care was consulted for [place] discussion who recommended home-based palliative care at discharge.  2/4 Acute blood loss anemia secondary to GI bleed. GI made aware Cardiology consulted for possible TEE and eval of severe AS  2/5 Hb 6.4. 1 u PRBCs transfused  2/7: Hb 6.7, 1 unit PRBCs transfused  A & P   Principal Problem:   Enterococcal bacteremia Active Problems:   Hypertension   Pacemaker   Aortic stenosis   CKD (chronic kidney disease), stage IV (HCC)   Acute lower UTI   Sepsis (HCC)   Bladder cancer (HCC)   Acute on chronic diastolic CHF (congestive heart failure) (HCC)   Abnormal liver function   Choledocholithiasis   Status post ileal conduit (HCC)   Macrocytic anemia   HOH (hard of hearing)   History of prostate cancer   1. Choledocholithiasis/cholangitis status post ERCP and sphincterotomy 04/27/2019 with Dr. Watt Climes a. Stable b. GI on board 2. Acute respiratory failure with hypoxia secondary to volume overload with wheezing a. Continue twice daily IV diuresis, cautious with severe AS b. Continue nebs 3. Acute Blood loss Anemia  secondary to GI bleed, diverticular source more likely vs less likely sphincterotomy source. Stable a. Status post 1 unit PRBC 2/5, additional unit PRBC 2/7 for Hb 6.7 b. Continue p.o. PPI to limit volume from IV -once daily dosing at discharge c. Hold aspirin, Lovenox, continue SCDs.  Discuss with GI when to restart 4. Sepsis on admission, sepsis has resolved a. Suspect multifactorial: UTI unknown organism, Enterococcus bacteremia, cholangitis b. Zosyn-> renally dosed ampicillin and ceftriaxone per ID c. Patient and daughter do not wish to proceed with TEE, will need to treat as if he does have endocarditis d. PICC placed  5. Episode of bigeminy during PICC placement, resolved  6. Enterococcus casseliflavus bacteremia a. Zosyn-> renally dosed ampicillin and ceftriaxone per ID: 4 weeks minimum, End Date: 05/23/2019 b. Echo as above 7. Severe aortic stenosis a. worsened from previous echo on 01/28/2019 b. Not a candidate for repair c. Palliative care consulted 8. Acute on chronic HFrEF, this is his main barrier to discharge a. EF similar to prior, 20 to 25% b. Weight up, 68.9 on admission.  c. Continue beta-blocker d. Daily weights e. Continue telemetry f. Lasix 20 mg IV twice daily, cautious diuresis with AS 9. Edematous upper extremities a. Keep arms elevated above heart level 10. Sick sinus syndrome status post PPM 11. Elevated troponin, suspect demand ischemia, stable. on telemetry 12. AKI on CKD 4, suspect multifactorial: cardiorenal, blood loss anemia a. Creatinine below baseline (2.2-2.3), currently 2.09 13. Hyperkalemia a. Improved with diuresis 14. Hypothyroid stable on levothyroxine 15. Gout  Continue current regimen 16. Bladder/prostate cancer history with ileal conduit  DVT prophylaxis: Holding Lovenox, added SCDs Family Communication: discussed with daughter over the phone Disposition Plan: Hb stable. Main barrier to discharge is volume/respiratory status. Continue IV  diuresis for now and reevaluate in AM. Hopeful discharge in next 24-48 hours.   Consultants  GI ID Cardiology Palliative care  Procedures  ERCP with sphincterotomy 04/27/2019 1 unit PRBCs on 2/5 1 unit PRBCs 2/7  Antibiotics   Anti-infectives (From admission, onward)   Start     Dose/Rate Route Frequency Ordered Stop   05/02/19 1200  ampicillin (OMNIPEN) 2 g in sodium chloride 0.9 % 100 mL IVPB     2 g 300 mL/hr over 20 Minutes Intravenous Every 8 hours 05/02/19 0847     04/30/19 1600  ampicillin (OMNIPEN) 2 g in sodium chloride 0.9 % 100 mL IVPB  Status:  Discontinued     2 g 300 mL/hr over 20 Minutes Intravenous Every 12 hours 04/30/19 1123 05/02/19 0847   04/30/19 1200  cefTRIAXone (ROCEPHIN) 2 g in sodium chloride 0.9 % 100 mL IVPB     2 g 200 mL/hr over 30 Minutes Intravenous Every 12 hours 04/30/19 1123     04/27/19 2100  vancomycin (VANCOCIN) IVPB 1000 mg/200 mL premix  Status:  Discontinued     1,000 mg 200 mL/hr over 60 Minutes Intravenous Every 48 hours 04/25/19 2129 04/26/19 1808   04/26/19 0200  piperacillin-tazobactam (ZOSYN) IVPB 2.25 g  Status:  Discontinued     2.25 g 100 mL/hr over 30 Minutes Intravenous Every 6 hours 04/25/19 2129 04/30/19 1123   04/25/19 2045  vancomycin (VANCOCIN) IVPB 1000 mg/200 mL premix     1,000 mg 200 mL/hr over 60 Minutes Intravenous  Once 04/25/19 2040 04/26/19 0028   04/25/19 1915  piperacillin-tazobactam (ZOSYN) IVPB 3.375 g     3.375 g 100 mL/hr over 30 Minutes Intravenous  Once 04/25/19 1911 04/25/19 2102           Subjective   Patient states he is still short of breath without supplemental O2. He does not have any complaints at this time. He tolerated breakfast well.   Objective   Vitals:   05/03/19 1252 05/03/19 2043 05/04/19 0601 05/04/19 0951  BP: (!) 157/44 (!) 166/62 (!) 158/60 (!) 150/50  Pulse: 60 61 (!) 54 64  Resp: 18 18 20 18   Temp: (!) 97.3 F (36.3 C) (!) 97.5 F (36.4 C) 97.7 F (36.5 C)    TempSrc: Oral     SpO2: 100% 100% 100% 100%  Weight:   73.8 kg   Height:        Intake/Output Summary (Last 24 hours) at 05/04/2019 1303 Last data filed at 05/04/2019 1037 Gross per 24 hour  Intake 630 ml  Output 3450 ml  Net -2820 ml   Filed Weights   04/29/19 2104 05/01/19 0500 05/04/19 0601  Weight: 72.2 kg 73.8 kg 73.8 kg    Examination:  Physical Exam Vitals and nursing note reviewed.  Constitutional:      Comments: Frail elderly male  HENT:     Head: Normocephalic.     Mouth/Throat:     Mouth: Mucous membranes are moist.  Eyes:     Conjunctiva/sclera: Conjunctivae normal.  Cardiovascular:     Rate and Rhythm: Normal rate.     Heart sounds: Murmur present.     Comments: Paced rhythm on telemetry Pulmonary:     Effort: Pulmonary effort is normal. No  respiratory distress.     Breath sounds: Rales present.  Abdominal:     General: There is no distension.  Musculoskeletal:     Comments: +1 lower extremity edema bilaterally  Skin:    Findings: Bruising present.  Neurological:     Mental Status: He is alert. Mental status is at baseline.  Psychiatric:        Mood and Affect: Mood normal.        Behavior: Behavior normal.     Data Reviewed: I have personally reviewed following labs and imaging studies  CBC: Recent Labs  Lab 04/30/19 0028 04/30/19 0802 05/01/19 0500 05/01/19 0500 05/02/19 0420 05/02/19 1617 05/02/19 2200 05/03/19 0350 05/04/19 0343  WBC 6.2  --  9.5  --  9.3  --   --  9.0 9.7  HGB 7.0*   < > 7.6*   < > 6.7* 8.4* 7.9* 8.2* 8.4*  HCT 23.1*   < > 23.7*   < > 21.3* 26.0* 25.4* 26.2* 26.6*  MCV 105.5*  --  101.3*  --  102.9*  --   --  101.9* 101.9*  PLT 166  --  137*  --  146*  --   --  151 170   < > = values in this interval not displayed.   Basic Metabolic Panel: Recent Labs  Lab 04/30/19 0028 05/01/19 0500 05/02/19 0420 05/03/19 0350 05/04/19 0343  NA 142 142 140 142 142  K 4.9 4.5 4.6 4.4 3.9  CL 116* 110 113* 115* 113*   CO2 17* 19* 23 21* 24  GLUCOSE 117* 79 93 91 103*  BUN 48* 46* 37* 30* 29*  CREATININE 3.06* 2.85* 2.38* 2.09* 1.90*  CALCIUM 7.3* 6.7* 7.0* 7.1* 7.5*  MG 2.2 2.0  --   --  1.9   GFR: Estimated Creatinine Clearance: 21.7 mL/min (A) (by C-G formula based on SCr of 1.9 mg/dL (H)). Liver Function Tests: Recent Labs  Lab 04/28/19 0449 04/29/19 0521 04/30/19 0028 05/03/19 0350  AST 142* 151* 96* 26  ALT 162* 144* 117* 44  ALKPHOS 152* 142* 130* 99  BILITOT 3.0* 2.3* 0.8 0.7  PROT 6.3* 5.5* 4.8* 5.0*  ALBUMIN 2.5* 2.2* 2.1* 2.1*   No results for input(s): LIPASE, AMYLASE in the last 168 hours. No results for input(s): AMMONIA in the last 168 hours. Coagulation Profile: No results for input(s): INR, PROTIME in the last 168 hours. Cardiac Enzymes: No results for input(s): CKTOTAL, CKMB, CKMBINDEX, TROPONINI in the last 168 hours. BNP (last 3 results) No results for input(s): PROBNP in the last 8760 hours. HbA1C: No results for input(s): HGBA1C in the last 72 hours. CBG: Recent Labs  Lab 05/02/19 2025  GLUCAP 126*   Lipid Profile: No results for input(s): CHOL, HDL, LDLCALC, TRIG, CHOLHDL, LDLDIRECT in the last 72 hours. Thyroid Function Tests: No results for input(s): TSH, T4TOTAL, FREET4, T3FREE, THYROIDAB in the last 72 hours. Anemia Panel: No results for input(s): VITAMINB12, FOLATE, FERRITIN, TIBC, IRON, RETICCTPCT in the last 72 hours. Sepsis Labs: No results for input(s): PROCALCITON, LATICACIDVEN in the last 168 hours.  Recent Results (from the past 240 hour(s))  Urine Culture     Status: Abnormal   Collection Time: 04/25/19  5:47 PM   Specimen: Urine, Random  Result Value Ref Range Status   Specimen Description   Final    URINE, RANDOM Performed at [facility] [address] [place], [place] [address]    Special Requests   Final  RIGHT NEPHROSTMY Performed at [facility] [address], [place], [place]  [address]    Culture MULTIPLE SPECIES PRESENT, SUGGEST RECOLLECTION (A)  Final   Report Status 04/26/2019 FINAL  Final  Urine culture     Status: Abnormal   Collection Time: 04/25/19  5:49 PM   Specimen: Urine, Random  Result Value Ref Range Status   Specimen Description   Final    URINE, RANDOM Performed at [facility] [address], [place], [place] [address]    Special Requests   Final    NONE Performed at [facility], [place] [address] [place], [place] [address]    Culture MULTIPLE SPECIES PRESENT, SUGGEST RECOLLECTION (A)  Final   Report Status 04/26/2019 FINAL  Final  Respiratory Panel by RT PCR (Flu A&B, Covid) - Nasopharyngeal Swab     Status: None   Collection Time: 04/25/19  6:53 PM   Specimen: Nasopharyngeal Swab  Result Value Ref Range Status   SARS Coronavirus 2 by RT PCR NEGATIVE NEGATIVE Final    Comment: (NOTE) SARS-CoV-2 target nucleic acids are NOT DETECTED. The SARS-CoV-2 RNA is generally detectable in upper respiratoy specimens during the acute phase of infection. The lowest concentration of SARS-CoV-2 viral copies this assay can detect is 131 copies/mL. A negative result does not preclude SARS-Cov-2 infection and should not be used as the sole basis for treatment or other patient management decisions. A negative result may occur with  improper specimen collection/handling, submission of specimen other than nasopharyngeal swab, presence of viral mutation(s) within the areas targeted by this assay, and inadequate number of viral copies (<131 copies/mL). A negative result must be combined with clinical observations, patient history, and epidemiological information. The expected result is Negative. Fact Sheet for Patients:  PinkCheek.be Fact Sheet for Healthcare Providers:  GravelBags.it This test is not yet ap proved or cleared by the [place] FDA and  has been  authorized for detection and/or diagnosis of SARS-CoV-2 by FDA under an Emergency Use Authorization (EUA). This EUA will remain  in effect (meaning this test can be used) for the duration of the COVID-19 declaration under Section 564(b)(1) of the Act, 21 U.S.C. section 360bbb-3(b)(1), unless the authorization is terminated or revoked sooner.    Influenza A by PCR NEGATIVE NEGATIVE Final   Influenza B by PCR NEGATIVE NEGATIVE Final    Comment: (NOTE) The Xpert Xpress SARS-CoV-2/FLU/RSV assay is intended as an aid in  the diagnosis of influenza from Nasopharyngeal swab specimens and  should not be used as a sole basis for treatment. Nasal washings and  aspirates are unacceptable for Xpert Xpress SARS-CoV-2/FLU/RSV  testing. Fact Sheet for Patients: PinkCheek.be Fact Sheet for Healthcare Providers: GravelBags.it This test is not yet approved or cleared by the [place] FDA and  has been authorized for detection and/or diagnosis of SARS-CoV-2 by  FDA under an Emergency Use Authorization (EUA). This EUA will remain  in effect (meaning this test can be used) for the duration of the  Covid-19 declaration under Section 564(b)(1) of the Act, 21  U.S.C. section 360bbb-3(b)(1), unless the authorization is  terminated or revoked. Performed at [facility], [place] [address] [place], [place] [address]   Blood culture (routine x 2)     Status: Abnormal   Collection Time: 04/25/19  8:29 PM   Specimen: BLOOD  Result Value Ref Range Status   Specimen Description BLOOD LEFT ANTECUBITAL  Final   Special Requests  Final    BOTTLES DRAWN AEROBIC AND ANAEROBIC Blood Culture adequate volume Performed at [facility] [address], [place], [place] [address]    Culture  Setup Time   Final    GRAM POSITIVE COCCI IN BOTH AEROBIC AND ANAEROBIC BOTTLES CRITICAL RESULT CALLED TO, READ BACK BY AND VERIFIED  WITH: JUSTIN LEGGE @1455  04/26/19 AKT    Culture (A)  Final    ENTEROCOCCUS CASSELIFLAVUS SUSCEPTIBILITIES PERFORMED ON PREVIOUS CULTURE WITHIN THE LAST 5 DAYS. Performed at [facility], [place] [address] [place], [place] [address]    Report Status 04/28/2019 FINAL  Final  Blood culture (routine x 2)     Status: Abnormal   Collection Time: 04/25/19  8:33 PM   Specimen: BLOOD RIGHT FOREARM  Result Value Ref Range Status   Specimen Description BLOOD RIGHT FOREARM  Final   Special Requests   Final    BOTTLES DRAWN AEROBIC AND ANAEROBIC Blood Culture adequate volume Performed at [facility] [address] [place], [place] [address]    Culture  Setup Time   Final    GRAM POSITIVE COCCI IN BOTH AEROBIC AND ANAEROBIC BOTTLES CRITICAL VALUE NOTED.  VALUE IS CONSISTENT WITH PREVIOUSLY REPORTED AND CALLED VALUE. AKT Organism ID to follow CRITICAL RESULT CALLED TO, READ BACK BY AND VERIFIED WITH: Christean Grief [facility] 04/26/19 1750 JDW Performed at [facility], [place] [address], [place], [place] [address]    Culture ENTEROCOCCUS CASSELIFLAVUS (A)  Final   Report Status 04/28/2019 FINAL  Final   Organism ID, Bacteria ENTEROCOCCUS CASSELIFLAVUS  Final      Susceptibility   Enterococcus casseliflavus - MIC*    AMPICILLIN <=2 SENSITIVE Sensitive     VANCOMYCIN RESISTANT Resistant     GENTAMICIN SYNERGY SENSITIVE Sensitive     LINEZOLID 2 SENSITIVE Sensitive     * ENTEROCOCCUS CASSELIFLAVUS  Blood Culture ID Panel (Reflexed)     Status: Abnormal   Collection Time: 04/25/19  8:33 PM  Result Value Ref Range Status   Enterococcus species DETECTED (A) NOT DETECTED Final    Comment: CRITICAL RESULT CALLED TO, READ BACK BY AND VERIFIED WITH: J LEGGE PHARMD 04/26/19 1750 JDW    Vancomycin resistance NOT DETECTED NOT DETECTED Final   Listeria monocytogenes NOT DETECTED NOT DETECTED Final   Staphylococcus species NOT DETECTED NOT DETECTED Final   Staphylococcus aureus (BCID)  NOT DETECTED NOT DETECTED Final   Streptococcus species NOT DETECTED NOT DETECTED Final   Streptococcus agalactiae NOT DETECTED NOT DETECTED Final   Streptococcus pneumoniae NOT DETECTED NOT DETECTED Final   Streptococcus pyogenes NOT DETECTED NOT DETECTED Final   Acinetobacter baumannii NOT DETECTED NOT DETECTED Final   Enterobacteriaceae species NOT DETECTED NOT DETECTED Final   Enterobacter cloacae complex NOT DETECTED NOT DETECTED Final   Escherichia coli NOT DETECTED NOT DETECTED Final   Klebsiella oxytoca NOT DETECTED NOT DETECTED Final   Klebsiella pneumoniae NOT DETECTED NOT DETECTED Final   Proteus species NOT DETECTED NOT DETECTED Final   Serratia marcescens NOT DETECTED NOT DETECTED Final   Haemophilus influenzae NOT DETECTED NOT DETECTED Final   Neisseria meningitidis NOT DETECTED NOT DETECTED Final   Pseudomonas aeruginosa NOT DETECTED NOT DETECTED Final   Candida albicans NOT DETECTED NOT DETECTED Final   Candida glabrata NOT DETECTED NOT DETECTED Final   Candida krusei NOT DETECTED NOT DETECTED Final   Candida parapsilosis NOT DETECTED NOT DETECTED Final   Candida tropicalis NOT DETECTED NOT DETECTED Final  Comment: Performed at [facility], [place] [address], [place], [place] [address]  Culture, blood (routine x 2)     Status: None   Collection Time: 04/27/19 10:14 AM   Specimen: BLOOD LEFT HAND  Result Value Ref Range Status   Specimen Description   Final    BLOOD LEFT HAND Performed at [facility] [address] [place], [place] [address]    Special Requests   Final    BOTTLES DRAWN AEROBIC ONLY Blood Culture adequate volume Performed at [facility] [address] [place], [place] [address]    Culture   Final    NO GROWTH 5 DAYS Performed at [facility], [place] [address], [place], [place] [address]    Report Status 05/02/2019 FINAL  Final  Culture, blood (routine x 2)     Status: None   Collection Time:  04/27/19 10:14 AM   Specimen: BLOOD  Result Value Ref Range Status   Specimen Description   Final    BLOOD LEFT ARM Performed at [facility] [address] [place], [place] [address]    Special Requests   Final    BOTTLES DRAWN AEROBIC ONLY Blood Culture adequate volume Performed at [facility] [address] [place], [place] [address]    Culture   Final    NO GROWTH 5 DAYS Performed at [facility], [place] [address] [place],  [address]    Report Status 05/02/2019 FINAL  Final         Radiology Studies: No results found.      Scheduled Meds: . Chlorhexidine Gluconate Cloth  6 each Topical Daily  . Chlorhexidine Gluconate Cloth  6 each Topical Daily  . cholecalciferol  1,000 Units Oral Daily  . docusate sodium  300 mg Oral Daily  . febuxostat  40 mg Oral Daily  . furosemide  20 mg Intravenous BID  . levothyroxine  25 mcg Oral Q0600  . metoprolol succinate  12.5 mg Oral Daily  . multivitamin  1 tablet Oral Daily  . pantoprazole  40 mg Oral BID  . sodium chloride flush  10-40 mL Intracatheter Q12H   Continuous Infusions: . sodium chloride Stopped (04/29/19 1820)  . ampicillin (OMNIPEN) IV 2 g (05/04/19 1235)  . cefTRIAXone (ROCEPHIN)  IV Stopped (05/04/19 0448)     LOS: 9 days    Time spent: 20 minutes with over 50% of the time coordinating the patient's care    Harold Hedge, DO [facility] Pager [phone]  If 7PM-7AM, please contact night-coverage [email or web address] Password [email or web address] 05/04/2019, 1:03 PM

## 2019-05-04 NOTE — Progress Notes (Signed)
Stable from GI bleeding standpoint.  No further BMs.  Hemoglobin stable over past 48 hours (8.4, was 8.2 yesterday) and BUN is also stable.    Patient continues to have some mild left lower quadrant discomfort intermittently, nothing severe.  On exam, there is no tenderness.  White count is normal and he is afebrile.  Patient is sitting in bed, no distress whatsoever, pleasant, cognitively intact.  Daughter at bedside.  Impression:  1.  Quiescent GI bleeding of indeterminate origin, possibly post sphincterotomy, possibly post-pyloric dil/duodenal ulcers. No evidence of bleeding for the past 48 hours 2.  Posthemorrhagic anemia, mild, stable status post transfusion of 2 units of packed cells 3.  Mild left lower quadrant discomfort without objective abnormalities 4.  History of cholangitis with successful clearance of bile duct last week by ERCP and sphincterotomy and stone extraction (LFTs have totally normalized) 5.  Pyloric stenosis, s/p balloon dil at time of ERCP 6.  Erosive duodenitis noted at time of ERCP   Recommendations:   1. We will sign off.    2. At this time I do not think follow-up with GI as an outpatient will be needed, but Dr. Watt Climes will contact the patient if he feels otherwise. 3. Since the patient did have erosive duodenitis at the time of his ERCP (presumably a consequence of his aspirin therapy prior to admission, without PPI coverage), I would recommend continuing PPI therapy.  Twice daily dosing, as he is currently receiving, would be prudent until time of discharge; when he goes home, once daily dosing should be sufficient. 4.  Patient should have follow-up hemoglobin checked as an outpatient a week or 2 post discharge to make sure it is holding stable or, hopefully, actually coming up.  This could be done through his primary physician or, if needed, the patient could be referred to our office for that purpose.  Iron supplementation may or may not be necessary but I would  not start that until he is seen as an outpatient.  Please call us if you have any questions or would like to discuss the patient's management.  Cleotis Nipper, M.D. Pager [phone] If no answer or after 5 PM call [phone]

## 2019-05-05 DIAGNOSIS — K611 Rectal abscess: Secondary | ICD-10-CM | POA: Diagnosis not present

## 2019-05-05 DIAGNOSIS — K750 Abscess of liver: Secondary | ICD-10-CM | POA: Diagnosis not present

## 2019-05-05 LAB — BASIC METABOLIC PANEL
Anion gap: 6 (ref 5–15)
BUN: 28 mg/dL — ABNORMAL HIGH (ref 8–23)
CO2: 25 mmol/L (ref 22–32)
Calcium: 7.7 mg/dL — ABNORMAL LOW (ref 8.9–10.3)
Chloride: 113 mmol/L — ABNORMAL HIGH (ref 98–111)
Creatinine, Ser: 1.96 mg/dL — ABNORMAL HIGH (ref 0.61–1.24)
GFR calc Af Amer: 33 mL/min — ABNORMAL LOW (ref >60)
GFR calc non Af Amer: 28 mL/min — ABNORMAL LOW (ref >60)
Glucose, Bld: 91 mg/dL (ref 70–99)
Potassium: 3.9 mmol/L (ref 3.5–5.1)
Sodium: 144 mmol/L (ref 135–145)

## 2019-05-05 LAB — CBC
HCT: 26.6 % — ABNORMAL LOW (ref 39.0–52.0)
Hemoglobin: 8.5 g/dL — ABNORMAL LOW (ref 13.0–17.0)
MCH: 32.6 pg (ref 26.0–34.0)
MCHC: 32 g/dL (ref 30.0–36.0)
MCV: 101.9 fL — ABNORMAL HIGH (ref 80.0–100.0)
Platelets: 177 10*3/uL (ref 150–400)
RBC: 2.61 MIL/uL — ABNORMAL LOW (ref 4.22–5.81)
RDW: 16.5 % — ABNORMAL HIGH (ref 11.5–15.5)
WBC: 8.6 10*3/uL (ref 4.0–10.5)
nRBC: 0 % (ref 0.0–0.2)

## 2019-05-05 LAB — MAGNESIUM: Magnesium: 2 mg/dL (ref 1.7–2.4)

## 2019-05-05 MED ORDER — CEFTRIAXONE IV (FOR PTA / DISCHARGE USE ONLY): 2.0000 g | Freq: Two times a day (BID) | INTRAVENOUS | 0 refills | Status: DC

## 2019-05-05 MED ORDER — HEPARIN SOD (PORK) LOCK FLUSH 100 UNIT/ML IV SOLN
250.0000 [IU] | INTRAVENOUS | Status: AC | PRN
  Administered 2019-05-05: 250 [IU]
  Filled 2019-05-05: qty 2.5

## 2019-05-05 MED ORDER — PANTOPRAZOLE SODIUM 40 MG PO TBEC: 40.0000 mg | DELAYED_RELEASE_TABLET | Freq: Every day | ORAL | 0 refills | Status: AC

## 2019-05-05 MED ORDER — AMPICILLIN IV (FOR PTA / DISCHARGE USE ONLY): 2.0000 g | Freq: Three times a day (TID) | INTRAVENOUS | 0 refills | Status: DC

## 2019-05-05 NOTE — Discharge Summary (Signed)
Physician Discharge Summary  [name] [name] KGM:[id number] DOB: 12-23-1921 DOA: 04/25/2019  PCP: Crist Infante, MD  Admit date: 04/25/2019 Discharge date: 05/05/2019  Admitted From: Home Disposition:  Home  Recommendations for Outpatient Follow-up:  1. Follow up with PCP in 1-2 weeks 2. Routine PICC care. Please d/c PICC after completing antibiotic 2/28 3. Recommend repeat CBC in 2 weeks  Home Health:RN, PT, OT   Discharge Condition:Stable CODE STATUS:Stable Diet recommendation: Diabetic   Brief/Interim Summary: 86 year old male with history of prostate and bladder cancer in 2006 with ileal conduit/urethral stricture, sinus node dysfunction status post PPM dual-chamber, aortic stenosis, gout, DVT who presented to [facility] with fever and left flank pain on 1/31 and shortness of breath, admitted for sepsis and found to have dural coccal bacteremia as well as found to have nonobstructing left renal stone and found to have elevated LFTs, Noncon CT showed CBD stones and intrahepatic and extrahepatic biliary dilation.  GI was consulted and he underwent ERCP with sphincterotomy with Dr. Watt Climes on 2/2.  For bacteremia, ID was consulted and recommended TTE and continue Zosyn.  At family's request, palliative care was consulted for [place] discussion who recommended home-based palliative care at discharge.  Discharge Diagnoses:  Principal Problem:   Enterococcal bacteremia Active Problems:   Hypertension   Pacemaker   Aortic stenosis   CKD (chronic kidney disease), stage IV (HCC)   Acute lower UTI   Sepsis (HCC)   Bladder cancer (HCC)   Acute on chronic diastolic CHF (congestive heart failure) (HCC)   Abnormal liver function   Choledocholithiasis   Status post ileal conduit (HCC)   Macrocytic anemia   HOH (hard of hearing)   History of prostate cancer  1. Choledocholithiasis/cholangitis status post ERCP and sphincterotomy 04/27/2019 with Dr. Watt Climes a. Stable b. GI had been following,  since signed off with recommendation for hgb check as outpatient in 2 weeks c. Recommendation to continue once daily PPI 2. Acute respiratory failure with hypoxia secondary to volume overload with wheezing a. Continue twice daily IV diuresis, cautious with severe AS b. Given nebs while in hospital 3. Acute Blood loss Anemia secondary to GI bleed, diverticular source more likely vs less likely sphincterotomy source. Stable a. Status post 1 unit PRBC 2/5, additional unit PRBC 2/7 for Hb 6.7 b. Continue p.o. PPI to limit volume from IV -once daily dosing at discharge c. Held aspirin, Lovenox, continued SCDs. Recommendation to continue once daily PPI  4. Sepsis on admission, sepsis has resolved a. Suspect multifactorial: UTI unknown organism, Enterococcus bacteremia, cholangitis b. Zosyn-> renally dosed ampicillin and ceftriaxone per ID c. Patient and daughter do not wish to proceed with TEE, will need to treat as if he does have endocarditis d. PICC placed  e. To complete abx on 2/28 5. Episode of bigeminy during PICC placement, resolved  6. Enterococcus casseliflavus bacteremia a. Zosyn-> renally dosed ampicillin and ceftriaxone per ID: 4 weeks minimum, End Date: 05/23/2019 b. Echo as above 7. Severe aortic stenosis a. worsened from previous echo on 01/28/2019 b. Not a candidate for repair c. Palliative care consulted 8. Acute on chronic HFrEF, this is his main barrier to discharge a. EF similar to prior, 20 to 25% b. Weight up, 68.9 on admission.  c. Continue beta-blocker d. Daily weights e. Lasix 20 mg IV twice daily while in hospital. To continue PO lasix on d/c 9. Edematous upper extremities a. Keep arms elevated above heart level 10. Sick sinus syndrome status post PPM 11. Elevated troponin,  suspect demand ischemia, stable. on telemetry 12. AKI on CKD 4, suspect multifactorial: cardiorenal, blood loss anemia a. Creatinine below baseline (2.2-2.3) 13. Hyperkalemia a. Improved with  diuresis 14. Hypothyroid stable on levothyroxine 15. Gout Continue current regimen 16. Bladder/prostate cancer history with ileal conduit  Discharge Instructions  Discharge Instructions    Home infusion instructions   Complete by: As directed    Instructions: Flushing of vascular access device: 0.9% NaCl pre/post medication administration and prn patency; Heparin 100 u/ml, 44ml for implanted ports and Heparin 10u/ml, 87ml for all other central venous catheters.     Allergies as of 05/05/2019   No Known Allergies     Medication List    TAKE these medications   acetaminophen 500 MG tablet Commonly known as: TYLENOL Take 1,000 mg by mouth every 8 (eight) hours as needed for mild pain or headache.   ampicillin  IVPB Inject 2 g into the vein every 8 (eight) hours for 21 days. Indication:  Enterococcal bacteremia, presumptive endocarditis Last Day of Therapy:  05/23/2019 Labs - Once weekly:  CBC/D and BMP   aspirin EC 81 MG tablet Take 81 mg by mouth every morning.   Besivance 0.6 % Susp Generic drug: Besifloxacin HCl Apply 1 drop to eye See admin instructions. Only uses before eye shot he receives every 10 weeks.   cefTRIAXone  IVPB Commonly known as: ROCEPHIN Inject 2 g into the vein every 12 (twelve) hours for 23 days. Indication:  Enterococcal bacteremia, presumptive endocarditis Last Day of Therapy:  05/23/2019 Labs - Once weekly:  CBC/D and BMP   CLEAR EYES COMPLETE OP Apply 1-2 drops to eye daily as needed (dry eyes).   colchicine 0.6 MG tablet Take 0.6 mg by mouth as needed (gout).   docusate sodium 100 MG capsule Commonly known as: COLACE Take 300 mg by mouth daily.   furosemide 20 MG tablet Commonly known as: LASIX Take 1 tablet (20 mg total) by mouth daily.   levothyroxine 25 MCG tablet Commonly known as: SYNTHROID Take 25 mcg by mouth every morning.   metoprolol succinate 12.5 mg Tb24 24 hr tablet Commonly known as: TOPROL-XL Take 12.5 mg by mouth  daily. What changed: Another medication with the same name was removed. Continue taking this medication, and follow the directions you see here.   OMEGA-3 KRILL OIL PO Take 1 tablet by mouth every morning.   polyethylene glycol powder 17 GM/SCOOP powder Commonly known as: GLYCOLAX/MIRALAX Take 17 g by mouth daily as needed for mild constipation. Takes 17grams (1 capful) as needed.   Uloric 40 MG tablet Generic drug: febuxostat Take 1 tablet by mouth daily.   Vitamin D-3 25 MCG (1000 UT) Caps Take 1 capsule by mouth every morning.   VITEYES AREDS ADVANCED PO Take 2 capsules by mouth daily.            Home Infusion Instuctions  (From admission, onward)         Start     Ordered   05/05/19 0000  Home infusion instructions    Question:  Instructions  Answer:  Flushing of vascular access device: 0.9% NaCl pre/post medication administration and prn patency; Heparin 100 u/ml, 67ml for implanted ports and Heparin 10u/ml, 72ml for all other central venous catheters.   05/05/19 1117         Follow-up Information    [facility].   Specialty: [place] Why: [place] [facility]/nursing/occupational therapy  [facility] Follow up.   Why: iv abx,med supplies       Crist Infante, MD. Schedule an appointment as soon as possible for a visit in 2 week(s).   Specialty: Internal Medicine Contact information: [place] [address] [phone]        Evans Lance, MD .   Specialty: Cardiology Contact information: [phone] [address] [place] [address] [phone]        Burnell Blanks, MD .   Specialty: Cardiology Contact information: [address] [place] [address] [phone]          No Known Allergies  Consultations:  GI  ID  Procedures/Studies: DG Chest 2 View  Result Date: 04/27/2019 CLINICAL DATA:  Pleural effusion, prior abnormal chest  x-ray EXAM: CHEST - 2 VIEW COMPARISON:  04/25/2019, 04/26/2019 FINDINGS: Frontal and lateral views of the chest demonstrate small right pleural effusion with blunting of the posterior right costophrenic angle. The rounded area of consolidation within the lateral segment right middle lobe seen on prior CT is again identified and unchanged. No pneumothorax. Cardiac silhouette is enlarged but stable. Dual lead pacemaker unchanged. IMPRESSION: 1. Stable small right pleural effusion. 2. Stable rounded consolidation right middle lobe. Please see prior CT chest discussion. Electronically Signed   By: Randa Ngo M.D.   On: 04/27/2019 11:24   CT CHEST WO CONTRAST  Result Date: 04/26/2019 CLINICAL DATA:  Lung nodule on chest x-ray EXAM: CT CHEST WITHOUT CONTRAST TECHNIQUE: Multidetector CT imaging of the chest was performed following the standard protocol without IV contrast. COMPARISON:  Abdominal CT from yesterday FINDINGS: Cardiovascular: No cardiomegaly. Dual-chamber pacer leads from the left. Low-density anterior and inferior pericardial effusion measuring up to 12 mm in thickness. Prominent aortic valve calcification. There is aortic and coronary atherosclerosis. Mediastinum/Nodes: Calcified mediastinal lymph nodes from remote granulomatous disease. Lungs/Pleura: Clustered nodularity with some calcification in the right upper lobe attributed to remote granulomatous disease. Along the lower right minor fissure is a subpleural nodule measuring 17 mm. There is a small borderline moderate layering right pleural effusion with right lower lobe atelectasis. Upper Abdomen: Cholelithiasis and partially covered right renal stenting. Post treatment changes to the left kidney. Musculoskeletal: Spondylosis with multi-level bridging osteophyte. IMPRESSION: 1. Confirmed 2 cm nodule in the right middle lobe with adjacent band of opacity along the lower major fissure. Hopefully this reflects a small focus of round atelectasis but  neoplasm is not excluded. The finding is new from a 2015 abdominal CT. Consider one of the following in 3 months for both low-risk and high-risk individuals: (a) repeat chest CT, (b) follow-up PET-CT, or (c) tissue sampling. This recommendation follows the consensus statement: Guidelines for Management of Incidental Pulmonary Nodules Detected on CT Images: From the Fleischner Society 2017; Radiology 2017; 284:228-243. 2. Small borderline moderate right pleural effusion with atelectasis. 3. Pericardial effusion measuring up to 12 mm thickness. Electronically Signed   By: Monte Fantasia M.D.   On: 04/26/2019 11:17   DG Chest Portable 1 View  Result Date: 04/25/2019 CLINICAL DATA:  Shortness of breath. Bradycardia. EXAM: PORTABLE CHEST 1 VIEW COMPARISON:  12/09/2018 FINDINGS: Mild cardiomegaly remains stable. Pacemaker remains in place. Previously seen bibasilar pulmonary opacity is resolved since previous study. A nodular opacity is seen in the lateral right lung base. IMPRESSION: 1. Nodular opacity in lateral right lung base. Recommend chest CT to exclude pulmonary nodule. 2. Cardiomegaly. Electronically Signed   By: Myles Rosenthal.D.  On: 04/25/2019 17:11   DG ERCP BILIARY & PANCREATIC DUCTS  Result Date: 04/27/2019 CLINICAL DATA:  ERCP with sphincterotomy and removal of multiple CBD stones. EXAM: ERCP TECHNIQUE: Multiple spot images obtained with the fluoroscopic device and submitted for interpretation post-procedure. COMPARISON:  CT abdomen pelvis-04/25/2019 FLUOROSCOPY TIME:  4 minutes, 56 seconds (93.85 mGy) FINDINGS: Seven spot intraoperative fluoroscopic images of the right upper abdominal quadrant during ERCP are provided for review Initial image demonstrates the superior end of the patient's chronic right-sided percutaneous nephrostomy catheter Subsequent images demonstrate an ERCP probe overlying the right upper abdominal quadrant. There is selective cannulation opacification of the common bile  duct which appears moderately dilated. There are at least 2 persistent nonocclusive filling defects within the common bile duct favored to represent choledocholithiasis Subsequent images demonstrate insufflation of a balloon within the central aspect of the CBD with subsequent biliary sweeping and presumed sphincterotomy. There is faint opacification of cystic duct with opacification of the gallbladder lumen. There are multiple persistent filling defects within the opacified gallbladder lumen compatible known cholelithiasis. IMPRESSION: 1. Choledocholithiasis with subsequent biliary sweeping and stone extraction. 2. Cholelithiasis. These images were submitted for radiologic interpretation only. Please see the procedural report for the amount of contrast and the fluoroscopy time utilized. Electronically Signed   By: Sandi Mariscal M.D.   On: 04/27/2019 16:00   ECHOCARDIOGRAM COMPLETE  Result Date: 04/28/2019   ECHOCARDIOGRAM REPORT   Patient Name:   [name] [name] Date of Exam: 04/28/2019 Medical Rec #:  [id number]          Height:       67.0 in Accession #:    [id number]         Weight:       159.4 lb Date of Birth:  1921-07-15           BSA:          1.84 m Patient Age:    86 years           BP:           155/75 mmHg Patient Gender: M                  HR:           54 bpm. Exam Location:  Inpatient Procedure: 2D Echo, Cardiac Doppler and Color Doppler                                 MODIFIED REPORT:     This report was modified by Cherlynn Kaiser MD on 04/28/2019 due to added                              comparison statement.  Indications:     Bacteremia.  History:         Patient has prior history of Echocardiogram examinations, most                  recent 12/10/2018. CHF, Abnormal ECG and Pacemaker; Risk                  Factors:Former Smoker and Hypertension. Cancer. Aortic                  stenosis.  Sonographer:     Roseanna Rainbow RDCS Referring Phys:  [place] Diagnosing Phys: Cherlynn Kaiser MD IMPRESSIONS  1. Left ventricular ejection fraction, by visual estimation, is 25 to 30%. The left ventricle has severely decreased function. There is moderately increased left ventricular hypertrophy.  2. Elevated left ventricular end-diastolic pressure.  3. Global right ventricle has mildly reduced systolic function.The right ventricular size is normal. No increase in right ventricular wall thickness.  4. Moderately elevated pulmonary artery systolic pressure, RVSP 47 mmHg.  5. Left atrial size was moderately dilated.  6. The aortic valve is abnormal. Aortic valve regurgitation is mild to moderate. Severe, low flow low gradient aortic valve stenosis. Systolic mean gradient is approximately 38 mmHg. Dimensionless index 0.21. There is severe calcifcation of the aortic valve. Severely restricted leaflet motion.  7. Mild mitral annular calcification.  8. The mitral valve is abnormal. Mild to moderate mitral valve regurgitation. No evidence of mitral stenosis.  9. The tricuspid valve is normal in structure. Tricuspid valve regurgitation is moderate. 10. There is mild dilatation of the ascending aorta measuring 40 mm. 11. The inferior vena cava is normal in size with <50% respiratory variability, suggesting right atrial pressure of 8 mmHg. 12. Trivial pericardial effusion is present. Moderate pleural effusion. 13. The left ventricle demonstrates global hypokinesis. 14. Side by side comparison of images performed to study from 12/10/2018. LV function has not significantly changed. Aortic stenosis is now severe. AR, MR, and TR appear to have increased. FINDINGS  Left Ventricle: Left ventricular ejection fraction, by visual estimation, is 25 to 30%. The left ventricle has severely decreased function. The left ventricle demonstrates global hypokinesis. There is moderately increased left ventricular hypertrophy. Left ventricular diastolic parameters are indeterminate. Elevated left ventricular end-diastolic pressure. Right Ventricle: The  right ventricular size is normal. No increase in right ventricular wall thickness. Global RV systolic function is has mildly reduced systolic function. The tricuspid regurgitant velocity is 3.11 m/s, and with an assumed right atrial pressure of 8 mmHg, the estimated right ventricular systolic pressure is moderately elevated at 46.6 mmHg. Left Atrium: Left atrial size was moderately dilated. Right Atrium: Right atrial size was normal in size Pericardium: Trivial pericardial effusion is present. There is a moderate pleural effusion in either the left or right lateral region. Mitral Valve: The mitral valve is abnormal. There is mild thickening of the mitral valve leaflet(s). Mild mitral annular calcification. Mild to moderate mitral valve regurgitation. No evidence of mitral valve stenosis by observation. Tricuspid Valve: The tricuspid valve is normal in structure. Tricuspid valve regurgitation is moderate. Aortic Valve: The aortic valve is abnormal. Aortic valve regurgitation is mild to moderate. Aortic regurgitation PHT measures 608 msec. Severe aortic stenosis is present. There is severe calcifcation of the aortic valve. Aortic valve mean gradient measures 37.5 mmHg. Aortic valve peak gradient measures 62.3 mmHg. Aortic valve area, by VTI measures 0.61 cm. Pulmonic Valve: The pulmonic valve was normal in structure. Pulmonic valve regurgitation is trivial. Pulmonic regurgitation is trivial. Aorta: Aortic dilatation noted. There is mild dilatation of the ascending aorta measuring 40 mm. Venous: The inferior vena cava is normal in size with less than 50% respiratory variability, suggesting right atrial pressure of 8 mmHg. IAS/Shunts: The interatrial septum was not well visualized. Additional Comments: A pacer wire is visualized in the right atrium and right ventricle.  LEFT VENTRICLE PLAX 2D LVIDd:         4.71 cm       Diastology LVIDs:         4.14 cm       LV e' medial:   2.81 cm/s  LV PW:         1.49 cm       LV  E/e' medial: 38.0 LV IVS:        1.38 cm LVOT diam:     2.10 cm LV SV:         27 ml LV SV Index:   14.49 LVOT Area:     3.46 cm  LV Volumes (MOD) LV area d, A2C:    37.10 cm LV area d, A4C:    38.40 cm LV area s, A2C:    30.60 cm LV area s, A4C:    33.80 cm LV major d, A2C:   9.25 cm LV major d, A4C:   8.65 cm LV major s, A2C:   8.72 cm LV major s, A4C:   8.42 cm LV vol d, MOD A2C: 122.0 ml LV vol d, MOD A4C: 138.0 ml LV vol s, MOD A2C: 87.7 ml LV vol s, MOD A4C: 109.0 ml LV SV MOD A2C:     34.3 ml LV SV MOD A4C:     138.0 ml LV SV MOD BP:      34.3 ml RIGHT VENTRICLE TAPSE (M-mode): 2.1 cm LEFT ATRIUM             Index       RIGHT ATRIUM           Index LA diam:        3.70 cm 2.01 cm/m  RA Area:     17.60 cm LA Vol (A2C):   86.7 ml 47.21 ml/m RA Volume:   48.00 ml  26.14 ml/m LA Vol (A4C):   71.4 ml 38.88 ml/m LA Biplane Vol: 85.6 ml 46.62 ml/m  AORTIC VALVE                    PULMONIC VALVE AV Area (Vmax):    0.62 cm     PR End Diast Vel: 1.80 msec AV Area (Vmean):   0.55 cm AV Area (VTI):     0.61 cm AV Vmax:           394.60 cm/s AV Vmean:          283.400 cm/s AV VTI:            1.084 m AV Peak Grad:      62.3 mmHg AV Mean Grad:      37.5 mmHg LVOT Vmax:         70.70 cm/s LVOT Vmean:        45.400 cm/s LVOT VTI:          0.191 m LVOT/AV VTI ratio: 0.18 AI PHT:            608 msec  AORTA Ao Root diam: 3.00 cm Ao Asc diam:  4.00 cm MITRAL VALVE                         TRICUSPID VALVE MV Area (PHT): 2.87 cm              TR Peak grad:   38.6 mmHg MV PHT:        76.56 msec            TR Vmax:        326.00 cm/s MV Decel Time: 264 msec MR Peak grad:    132.7 mmHg          SHUNTS MR Mean grad:    78.0 mmHg  Systemic VTI:  0.19 m MR Vmax:         576.00 cm/s         Systemic Diam: 2.10 cm MR Vmean:        405.0 cm/s MR PISA:         1.01 cm MR PISA Eff ROA: 6 mm MR PISA Radius:  0.40 cm MV E velocity: 106.67 cm/s 103 cm/s MV A velocity: 100.65 cm/s 70.3 cm/s MV E/A ratio:  1.06        1.5   Cherlynn Kaiser MD Electronically signed by Cherlynn Kaiser MD Signature Date/Time: 04/28/2019/3:22:46 PM    Final (Updated)    CT RENAL STONE STUDY  Result Date: 04/25/2019 CLINICAL DATA:  Flank pain with kidney stones suspected EXAM: CT ABDOMEN AND PELVIS WITHOUT CONTRAST TECHNIQUE: Multidetector CT imaging of the abdomen and pelvis was performed following the standard protocol without IV contrast. COMPARISON:  June 25, 2017 FINDINGS: Lower chest: There is a moderate to large right-sided pleural effusion that is only partially visualized on this exam. There is suggestion of a 1.9 cm subpleural nodule involving the right middle lobe (axial series 2, image 6).The heart is enlarged. There is a small pericardial effusion. Hepatobiliary: The liver is normal. Cholelithiasis without acute inflammation.Multiple stones are noted in the common bile duct. There is mild biliary ductal dilatation with the common bile duct measuring up to 1.4 cm. Pancreas: Normal contours without ductal dilatation. No peripancreatic fluid collection. Spleen: No splenic laceration or hematoma. Adrenals/Urinary Tract: --Adrenal glands: No adrenal hemorrhage. --Right kidney/ureter: There is a right-sided percutaneous nephrostomy tube. There is no evidence for hydronephrosis. The nephrostomy tube appears somewhat kinked but is likely functional given the lack of hydronephrosis. There is mild dilatation of the right ureter. --Left kidney/ureter: No evidence for hydronephrosis. There is a stone in the lower pole measuring approximately 1.4 cm. The patient may be status post prior left renal ablation. --Urinary bladder: The urinary bladder is surgically absent. An ileal conduit is noted. Stomach/Bowel: --Stomach/Duodenum: The stomach is moderately distended. --Small bowel: No dilatation or inflammation. --Colon: Rectosigmoid diverticulosis without acute inflammation. --Appendix: Not visualized. No right lower quadrant inflammation or free fluid.  Vascular/Lymphatic: Atherosclerotic calcification is present within the non-aneurysmal abdominal aorta, without hemodynamically significant stenosis. --No retroperitoneal lymphadenopathy. --No mesenteric lymphadenopathy. --there are bilateral fat containing inguinal hernias, left greater than right. There is a low midline ventral wall hernia containing a loop of small bowel without evidence for obstruction (axial series 2, image 68). Reproductive: Prostate gland is surgically absent. Other: No ascites or free air. The abdominal wall is normal. Musculoskeletal. No acute displaced fractures. IMPRESSION: 1. No specific abnormality to explain the patient's left flank pain. There is no left-sided hydronephrosis. There is a 1.4 cm nonobstructing stone in lower pole the left kidney. 2. Moderate to large right-sided pleural effusion. Small left-sided pleural effusion. 3. Cholelithiasis with CT evidence for choledocholithiasis. Multiple stones are noted in the common bile duct of both proximally and distally. The common bile duct is dilated and there is mild intrahepatic biliary ductal dilatation. There is no CT evidence for acute calculus cholecystitis. 4. Right-sided percutaneous nephrostomy tube in place. While the tube appears slightly tortuous and possibly kinked, the tube appears to be functioning appropriately as there is no right-sided hydronephrosis. 5. Findings concerning for 1.9 cm mass in the right middle lobe. Follow-up with a nonemergent contrast enhanced CT of the chest is recommended for further evaluation of this finding. 6. Multiple additional chronic findings  as detailed above. Aortic Atherosclerosis (ICD10-I70.0). Electronically Signed   By: Constance Holster M.D.   On: 04/25/2019 18:50   [place] EKG SITE RITE  Result Date: 04/30/2019 If Site Rite image not attached, placement could not be confirmed due to current cardiac rhythm.  US Abdomen Limited RUQ  Result Date: 04/25/2019 CLINICAL DATA:   86 year old male with right upper quadrant abdominal pain for 1 week. Cholelithiasis, choledocholithiasis, and right pleural effusion on CT Abdomen and Pelvis today. EXAM: ULTRASOUND ABDOMEN LIMITED RIGHT UPPER QUADRANT COMPARISON:  CT Abdomen and Pelvis 1818 hours today. FINDINGS: Gallbladder: Gallbladder wall thickness remains normal at 1 to 2 millimeters. Echogenic gallstones individually estimated at 14 millimeters (image 12). No pericholecystic fluid. No sonographic Murphy sign elicited. Common bile duct: Diameter: 13 millimeters, dilated. Cannot visualize the filling defects within the ducts seen by CT today. Liver: Evidence of intrahepatic biliary ductal dilatation on image 48. Background liver echogenicity within normal limits. No discrete liver lesion. Portal vein is patent on color Doppler imaging with normal direction of blood flow towards the liver. Other: Right pleural effusion redemonstrated. IMPRESSION: 1. Dilated intra- and extrahepatic biliary tree due to the multifocal Choledocholithiasis demonstrated by CT today. 2. Cholelithiasis, no superimposed acute cholecystitis. 3. Right pleural effusion redemonstrated. Electronically Signed   By: Genevie Ann M.D.   On: 04/25/2019 19:50     Subjective: Eager to go home  Discharge Exam: Vitals:   05/05/19 0509 05/05/19 0954  BP: (!) 149/63 (!) 183/66  Pulse: (!) 53 67  Resp: (!) 21   Temp: 98 F (36.7 C)   SpO2: 93%    Vitals:   05/04/19 2021 05/05/19 0443 05/05/19 0509 05/05/19 0954  BP: (!) 170/66  (!) 149/63 (!) 183/66  Pulse: 60  (!) 53 67  Resp: (!) 21  (!) 21   Temp: 98.4 F (36.9 C)  98 F (36.7 C)   TempSrc: Oral  Oral   SpO2: 99%  93%   Weight:  74.3 kg    Height:        General: Pt is alert, awake, not in acute distress Cardiovascular: RRR, S1/S2 +, no rubs, no gallops Respiratory: CTA bilaterally, no wheezing, no rhonchi Abdominal: Soft, NT, ND, bowel sounds + Extremities: no edema, no cyanosis   The results of  significant diagnostics from this hospitalization (including imaging, microbiology, ancillary and laboratory) are listed below for reference.     Microbiology: Recent Results (from the past 240 hour(s))  Urine Culture     Status: Abnormal   Collection Time: 04/25/19  5:47 PM   Specimen: Urine, Random  Result Value Ref Range Status   Specimen Description   Final    URINE, RANDOM Performed at [facility] [address], [place], [place] [address]    Special Requests   Final    RIGHT NEPHROSTMY Performed at [facility] [address] [place], [place] [address]    Culture MULTIPLE SPECIES PRESENT, SUGGEST RECOLLECTION (A)  Final   Report Status 04/26/2019 FINAL  Final  Urine culture     Status: Abnormal   Collection Time: 04/25/19  5:49 PM   Specimen: Urine, Random  Result Value Ref Range Status   Specimen Description   Final    URINE, RANDOM Performed at [facility] [address] [place], [place] [address]    Special Requests   Final    NONE Performed at [facility], [place] [address], [place], [place] [address]  Culture MULTIPLE SPECIES PRESENT, SUGGEST RECOLLECTION (A)  Final   Report Status 04/26/2019 FINAL  Final  Respiratory Panel by RT PCR (Flu A&B, Covid) - Nasopharyngeal Swab     Status: None   Collection Time: 04/25/19  6:53 PM   Specimen: Nasopharyngeal Swab  Result Value Ref Range Status   SARS Coronavirus 2 by RT PCR NEGATIVE NEGATIVE Final    Comment: (NOTE) SARS-CoV-2 target nucleic acids are NOT DETECTED. The SARS-CoV-2 RNA is generally detectable in upper respiratoy specimens during the acute phase of infection. The lowest concentration of SARS-CoV-2 viral copies this assay can detect is 131 copies/mL. A negative result does not preclude SARS-Cov-2 infection and should not be used as the sole basis for treatment or other patient management decisions. A negative result may  occur with  improper specimen collection/handling, submission of specimen other than nasopharyngeal swab, presence of viral mutation(s) within the areas targeted by this assay, and inadequate number of viral copies (<131 copies/mL). A negative result must be combined with clinical observations, patient history, and epidemiological information. The expected result is Negative. Fact Sheet for Patients:  PinkCheek.be Fact Sheet for Healthcare Providers:  GravelBags.it This test is not yet ap proved or cleared by the [place] FDA and  has been authorized for detection and/or diagnosis of SARS-CoV-2 by FDA under an Emergency Use Authorization (EUA). This EUA will remain  in effect (meaning this test can be used) for the duration of the COVID-19 declaration under Section 564(b)(1) of the Act, 21 U.S.C. section 360bbb-3(b)(1), unless the authorization is terminated or revoked sooner.    Influenza A by PCR NEGATIVE NEGATIVE Final   Influenza B by PCR NEGATIVE NEGATIVE Final    Comment: (NOTE) The Xpert Xpress SARS-CoV-2/FLU/RSV assay is intended as an aid in  the diagnosis of influenza from Nasopharyngeal swab specimens and  should not be used as a sole basis for treatment. Nasal washings and  aspirates are unacceptable for Xpert Xpress SARS-CoV-2/FLU/RSV  testing. Fact Sheet for Patients: PinkCheek.be Fact Sheet for Healthcare Providers: GravelBags.it This test is not yet approved or cleared by the [place] FDA and  has been authorized for detection and/or diagnosis of SARS-CoV-2 by  FDA under an Emergency Use Authorization (EUA). This EUA will remain  in effect (meaning this test can be used) for the duration of the  Covid-19 declaration under Section 564(b)(1) of the Act, 21  U.S.C. section 360bbb-3(b)(1), unless the authorization is  terminated or  revoked. Performed at [facility], [place] [address] [place], [place] [address]   Blood culture (routine x 2)     Status: Abnormal   Collection Time: 04/25/19  8:29 PM   Specimen: BLOOD  Result Value Ref Range Status   Specimen Description BLOOD LEFT ANTECUBITAL  Final   Special Requests   Final    BOTTLES DRAWN AEROBIC AND ANAEROBIC Blood Culture adequate volume Performed at [facility] [address], [place], [place] [address]    Culture  Setup Time   Final    GRAM POSITIVE COCCI IN BOTH AEROBIC AND ANAEROBIC BOTTLES CRITICAL RESULT CALLED TO, READ BACK BY AND VERIFIED WITH: JUSTIN LEGGE @1455  04/26/19 AKT    Culture (A)  Final    ENTEROCOCCUS CASSELIFLAVUS SUSCEPTIBILITIES PERFORMED ON PREVIOUS CULTURE WITHIN THE LAST 5 DAYS. Performed at [facility], [place] [address] [place], [place] [address]    Report Status 04/28/2019 FINAL  Final  Blood culture (routine x 2)  Status: Abnormal   Collection Time: 04/25/19  8:33 PM   Specimen: BLOOD RIGHT FOREARM  Result Value Ref Range Status   Specimen Description BLOOD RIGHT FOREARM  Final   Special Requests   Final    BOTTLES DRAWN AEROBIC AND ANAEROBIC Blood Culture adequate volume Performed at [facility] [address] [place], [place] [address]    Culture  Setup Time   Final    GRAM POSITIVE COCCI IN BOTH AEROBIC AND ANAEROBIC BOTTLES CRITICAL VALUE NOTED.  VALUE IS CONSISTENT WITH PREVIOUSLY REPORTED AND CALLED VALUE. AKT Organism ID to follow CRITICAL RESULT CALLED TO, READ BACK BY AND VERIFIED WITH: Christean Grief [facility] 04/26/19 1750 JDW Performed at [facility], [place] [address], [place], [place] [address]    Culture ENTEROCOCCUS CASSELIFLAVUS (A)  Final   Report Status 04/28/2019 FINAL  Final   Organism ID, Bacteria ENTEROCOCCUS CASSELIFLAVUS  Final      Susceptibility   Enterococcus casseliflavus - MIC*    AMPICILLIN <=2 SENSITIVE Sensitive      VANCOMYCIN RESISTANT Resistant     GENTAMICIN SYNERGY SENSITIVE Sensitive     LINEZOLID 2 SENSITIVE Sensitive     * ENTEROCOCCUS CASSELIFLAVUS  Blood Culture ID Panel (Reflexed)     Status: Abnormal   Collection Time: 04/25/19  8:33 PM  Result Value Ref Range Status   Enterococcus species DETECTED (A) NOT DETECTED Final    Comment: CRITICAL RESULT CALLED TO, READ BACK BY AND VERIFIED WITH: J LEGGE PHARMD 04/26/19 1750 JDW    Vancomycin resistance NOT DETECTED NOT DETECTED Final   Listeria monocytogenes NOT DETECTED NOT DETECTED Final   Staphylococcus species NOT DETECTED NOT DETECTED Final   Staphylococcus aureus (BCID) NOT DETECTED NOT DETECTED Final   Streptococcus species NOT DETECTED NOT DETECTED Final   Streptococcus agalactiae NOT DETECTED NOT DETECTED Final   Streptococcus pneumoniae NOT DETECTED NOT DETECTED Final   Streptococcus pyogenes NOT DETECTED NOT DETECTED Final   Acinetobacter baumannii NOT DETECTED NOT DETECTED Final   Enterobacteriaceae species NOT DETECTED NOT DETECTED Final   Enterobacter cloacae complex NOT DETECTED NOT DETECTED Final   Escherichia coli NOT DETECTED NOT DETECTED Final   Klebsiella oxytoca NOT DETECTED NOT DETECTED Final   Klebsiella pneumoniae NOT DETECTED NOT DETECTED Final   Proteus species NOT DETECTED NOT DETECTED Final   Serratia marcescens NOT DETECTED NOT DETECTED Final   Haemophilus influenzae NOT DETECTED NOT DETECTED Final   Neisseria meningitidis NOT DETECTED NOT DETECTED Final   Pseudomonas aeruginosa NOT DETECTED NOT DETECTED Final   Candida albicans NOT DETECTED NOT DETECTED Final   Candida glabrata NOT DETECTED NOT DETECTED Final   Candida krusei NOT DETECTED NOT DETECTED Final   Candida parapsilosis NOT DETECTED NOT DETECTED Final   Candida tropicalis NOT DETECTED NOT DETECTED Final    Comment: Performed at [facility], [place]. [address], [place], [place] [address]  Culture, blood (routine x 2)     Status: None   Collection  Time: 04/27/19 10:14 AM   Specimen: BLOOD LEFT HAND  Result Value Ref Range Status   Specimen Description   Final    BLOOD LEFT HAND Performed at [facility] [address], [place], [place] [address]    Special Requests   Final    BOTTLES DRAWN AEROBIC ONLY Blood Culture adequate volume Performed at [facility] [address], [place], [place] [address]    Culture   Final    NO  GROWTH 5 DAYS Performed at [facility], [place] [address], [place], [place] [address]    Report Status 05/02/2019 FINAL  Final  Culture, blood (routine x 2)     Status: None   Collection Time: 04/27/19 10:14 AM   Specimen: BLOOD  Result Value Ref Range Status   Specimen Description   Final    BLOOD LEFT ARM Performed at [facility] [address], [place], [place] [address]    Special Requests   Final    BOTTLES DRAWN AEROBIC ONLY Blood Culture adequate volume Performed at [facility] [address], [place], [place] [address]    Culture   Final    NO GROWTH 5 DAYS Performed at [facility], [place] [address] [place] [address], [place], [place] [address]    Report Status 05/02/2019 FINAL  Final     Labs: BNP (last 3 results) Recent Labs    12/09/18 2042 04/25/19 2033  BNP 3,559.2* 1,540.0*   Basic Metabolic Panel: Recent Labs  Lab 04/30/19 0028 04/30/19 0028 05/01/19 0500 05/02/19 0420 05/03/19 0350 05/04/19 0343 05/05/19 0415  NA 142   < > 142 140 142 142 144  K 4.9   < > 4.5 4.6 4.4 3.9 3.9  CL 116*   < > 110 113* 115* 113* 113*  CO2 17*   < > 19* 23 21* 24 25  GLUCOSE 117*   < > 79 93 91 103* 91  BUN 48*   < > 46* 37* 30* 29* 28*  CREATININE 3.06*   < > 2.85* 2.38* 2.09* 1.90* 1.96*  CALCIUM 7.3*   < > 6.7* 7.0* 7.1* 7.5* 7.7*  MG 2.2  --  2.0  --   --  1.9 2.0   < > = values in this interval not displayed.   Liver Function Tests: Recent Labs  Lab 04/29/19 0521 04/30/19 0028 05/03/19 0350  AST 151*  96* 26  ALT 144* 117* 44  ALKPHOS 142* 130* 99  BILITOT 2.3* 0.8 0.7  PROT 5.5* 4.8* 5.0*  ALBUMIN 2.2* 2.1* 2.1*   No results for input(s): LIPASE, AMYLASE in the last 168 hours. No results for input(s): AMMONIA in the last 168 hours. CBC: Recent Labs  Lab 05/01/19 0500 05/01/19 0500 05/02/19 0420 05/02/19 0420 05/02/19 1617 05/02/19 2200 05/03/19 0350 05/04/19 0343 05/05/19 0415  WBC 9.5  --  9.3  --   --   --  9.0 9.7 8.6  HGB 7.6*   < > 6.7*   < > 8.4* 7.9* 8.2* 8.4* 8.5*  HCT 23.7*   < > 21.3*   < > 26.0* 25.4* 26.2* 26.6* 26.6*  MCV 101.3*  --  102.9*  --   --   --  101.9* 101.9* 101.9*  PLT 137*  --  146*  --   --   --  151 170 177   < > = values in this interval not displayed.   Cardiac Enzymes: No results for input(s): CKTOTAL, CKMB, CKMBINDEX, TROPONINI in the last 168 hours. BNP: Invalid input(s): POCBNP CBG: Recent Labs  Lab 05/02/19 2025  GLUCAP 126*   D-Dimer No results for input(s): DDIMER in the last 72 hours. Hgb A1c No results for input(s): HGBA1C in the last 72 hours. Lipid Profile No results for input(s): CHOL, HDL, LDLCALC, TRIG, CHOLHDL, LDLDIRECT in the last 72 hours. Thyroid function studies No results for input(s): TSH, T4TOTAL, T3FREE, THYROIDAB in the last 72 hours.  Invalid  input(s): FREET3 Anemia work up No results for input(s): VITAMINB12, FOLATE, FERRITIN, TIBC, IRON, RETICCTPCT in the last 72 hours. Urinalysis    Component Value Date/Time   COLORURINE AMBER (A) 04/25/2019 1747   APPEARANCEUR TURBID (A) 04/25/2019 1747   LABSPEC 1.014 04/25/2019 1747   PHURINE 8.0 04/25/2019 1747   GLUCOSEU NEGATIVE 04/25/2019 1747   HGBUR SMALL (A) 04/25/2019 1747   BILIRUBINUR NEGATIVE 04/25/2019 1747   KETONESUR NEGATIVE 04/25/2019 1747   PROTEINUR >=300 (A) 04/25/2019 1747   UROBILINOGEN 0.2 02/04/2014 2157   NITRITE NEGATIVE 04/25/2019 1747   LEUKOCYTESUR LARGE (A) 04/25/2019 1747   Sepsis Labs Invalid input(s): PROCALCITONIN,   WBC,  LACTICIDVEN Microbiology Recent Results (from the past 240 hour(s))  Urine Culture     Status: Abnormal   Collection Time: 04/25/19  5:47 PM   Specimen: Urine, Random  Result Value Ref Range Status   Specimen Description   Final    URINE, RANDOM Performed at [facility], [place] [address] [place], [place] [address]    Special Requests   Final    RIGHT NEPHROSTMY Performed at [facility] [address], [place], [place] [address]    Culture MULTIPLE SPECIES PRESENT, SUGGEST RECOLLECTION (A)  Final   Report Status 04/26/2019 FINAL  Final  Urine culture     Status: Abnormal   Collection Time: 04/25/19  5:49 PM   Specimen: Urine, Random  Result Value Ref Range Status   Specimen Description   Final    URINE, RANDOM Performed at [facility] [address], [place], [place] [address]    Special Requests   Final    NONE Performed at [facility], [place] [address], [place], [place] [address]    Culture MULTIPLE SPECIES PRESENT, SUGGEST RECOLLECTION (A)  Final   Report Status 04/26/2019 FINAL  Final  Respiratory Panel by RT PCR (Flu A&B, Covid) - Nasopharyngeal Swab     Status: None   Collection Time: 04/25/19  6:53 PM   Specimen: Nasopharyngeal Swab  Result Value Ref Range Status   SARS Coronavirus 2 by RT PCR NEGATIVE NEGATIVE Final    Comment: (NOTE) SARS-CoV-2 target nucleic acids are NOT DETECTED. The SARS-CoV-2 RNA is generally detectable in upper respiratoy specimens during the acute phase of infection. The lowest concentration of SARS-CoV-2 viral copies this assay can detect is 131 copies/mL. A negative result does not preclude SARS-Cov-2 infection and should not be used as the sole basis for treatment or other patient management decisions. A negative result may occur with  improper specimen collection/handling, submission of specimen other than nasopharyngeal swab, presence of viral  mutation(s) within the areas targeted by this assay, and inadequate number of viral copies (<131 copies/mL). A negative result must be combined with clinical observations, patient history, and epidemiological information. The expected result is Negative. Fact Sheet for Patients:  PinkCheek.be Fact Sheet for Healthcare Providers:  GravelBags.it This test is not yet ap proved or cleared by the [place] FDA and  has been authorized for detection and/or diagnosis of SARS-CoV-2 by FDA under an Emergency Use Authorization (EUA). This EUA will remain  in effect (meaning this test can be used) for the duration of the COVID-19 declaration under Section 564(b)(1) of the Act, 21 U.S.C. section 360bbb-3(b)(1), unless the authorization is terminated or revoked sooner.    Influenza A by PCR NEGATIVE NEGATIVE Final   Influenza B by PCR NEGATIVE NEGATIVE Final    Comment: (NOTE) The  Xpert Xpress SARS-CoV-2/FLU/RSV assay is intended as an aid in  the diagnosis of influenza from Nasopharyngeal swab specimens and  should not be used as a sole basis for treatment. Nasal washings and  aspirates are unacceptable for Xpert Xpress SARS-CoV-2/FLU/RSV  testing. Fact Sheet for Patients: PinkCheek.be Fact Sheet for Healthcare Providers: GravelBags.it This test is not yet approved or cleared by the [place] FDA and  has been authorized for detection and/or diagnosis of SARS-CoV-2 by  FDA under an Emergency Use Authorization (EUA). This EUA will remain  in effect (meaning this test can be used) for the duration of the  Covid-19 declaration under Section 564(b)(1) of the Act, 21  U.S.C. section 360bbb-3(b)(1), unless the authorization is  terminated or revoked. Performed at [facility], [place] [address], [place], [place] [address]   Blood culture (routine x 2)      Status: Abnormal   Collection Time: 04/25/19  8:29 PM   Specimen: BLOOD  Result Value Ref Range Status   Specimen Description BLOOD LEFT ANTECUBITAL  Final   Special Requests   Final    BOTTLES DRAWN AEROBIC AND ANAEROBIC Blood Culture adequate volume Performed at [facility] [address] [place], [place] [address]    Culture  Setup Time   Final    GRAM POSITIVE COCCI IN BOTH AEROBIC AND ANAEROBIC BOTTLES CRITICAL RESULT CALLED TO, READ BACK BY AND VERIFIED WITH: JUSTIN LEGGE @1455  04/26/19 AKT    Culture (A)  Final    ENTEROCOCCUS CASSELIFLAVUS SUSCEPTIBILITIES PERFORMED ON PREVIOUS CULTURE WITHIN THE LAST 5 DAYS. Performed at [facility], [place] [address], [place], [place] [address]    Report Status 04/28/2019 FINAL  Final  Blood culture (routine x 2)     Status: Abnormal   Collection Time: 04/25/19  8:33 PM   Specimen: BLOOD RIGHT FOREARM  Result Value Ref Range Status   Specimen Description BLOOD RIGHT FOREARM  Final   Special Requests   Final    BOTTLES DRAWN AEROBIC AND ANAEROBIC Blood Culture adequate volume Performed at [facility] [address], [place], [place] [address]    Culture  Setup Time   Final    GRAM POSITIVE COCCI IN BOTH AEROBIC AND ANAEROBIC BOTTLES CRITICAL VALUE NOTED.  VALUE IS CONSISTENT WITH PREVIOUSLY REPORTED AND CALLED VALUE. AKT Organism ID to follow CRITICAL RESULT CALLED TO, READ BACK BY AND VERIFIED WITH: Christean Grief [facility] 04/26/19 1750 JDW Performed at [facility], [place] [address], [place], [place] [address]    Culture ENTEROCOCCUS CASSELIFLAVUS (A)  Final   Report Status 04/28/2019 FINAL  Final   Organism ID, Bacteria ENTEROCOCCUS CASSELIFLAVUS  Final      Susceptibility   Enterococcus casseliflavus - MIC*    AMPICILLIN <=2 SENSITIVE Sensitive     VANCOMYCIN RESISTANT Resistant     GENTAMICIN SYNERGY SENSITIVE Sensitive     LINEZOLID 2 SENSITIVE Sensitive     * ENTEROCOCCUS  CASSELIFLAVUS  Blood Culture ID Panel (Reflexed)     Status: Abnormal   Collection Time: 04/25/19  8:33 PM  Result Value Ref Range Status   Enterococcus species DETECTED (A) NOT DETECTED Final    Comment: CRITICAL RESULT CALLED TO, READ BACK BY AND VERIFIED WITH: J LEGGE PHARMD 04/26/19 1750 JDW    Vancomycin resistance NOT DETECTED NOT DETECTED Final   Listeria monocytogenes NOT DETECTED NOT DETECTED Final   Staphylococcus species NOT DETECTED NOT DETECTED Final   Staphylococcus aureus (  BCID) NOT DETECTED NOT DETECTED Final   Streptococcus species NOT DETECTED NOT DETECTED Final   Streptococcus agalactiae NOT DETECTED NOT DETECTED Final   Streptococcus pneumoniae NOT DETECTED NOT DETECTED Final   Streptococcus pyogenes NOT DETECTED NOT DETECTED Final   Acinetobacter baumannii NOT DETECTED NOT DETECTED Final   Enterobacteriaceae species NOT DETECTED NOT DETECTED Final   Enterobacter cloacae complex NOT DETECTED NOT DETECTED Final   Escherichia coli NOT DETECTED NOT DETECTED Final   Klebsiella oxytoca NOT DETECTED NOT DETECTED Final   Klebsiella pneumoniae NOT DETECTED NOT DETECTED Final   Proteus species NOT DETECTED NOT DETECTED Final   Serratia marcescens NOT DETECTED NOT DETECTED Final   Haemophilus influenzae NOT DETECTED NOT DETECTED Final   Neisseria meningitidis NOT DETECTED NOT DETECTED Final   Pseudomonas aeruginosa NOT DETECTED NOT DETECTED Final   Candida albicans NOT DETECTED NOT DETECTED Final   Candida glabrata NOT DETECTED NOT DETECTED Final   Candida krusei NOT DETECTED NOT DETECTED Final   Candida parapsilosis NOT DETECTED NOT DETECTED Final   Candida tropicalis NOT DETECTED NOT DETECTED Final    Comment: Performed at [facility], [place] [address], [place], [place] [address]  Culture, blood (routine x 2)     Status: None   Collection Time: 04/27/19 10:14 AM   Specimen: BLOOD LEFT HAND  Result Value Ref Range Status   Specimen Description   Final    BLOOD LEFT  HAND Performed at [facility] [address], [place], [place] [address]    Special Requests   Final    BOTTLES DRAWN AEROBIC ONLY Blood Culture adequate volume Performed at [facility] [address], [place], [place] [address]    Culture   Final    NO GROWTH 5 DAYS Performed at [facility], [place] [address], [place], [place] [address]    Report Status 05/02/2019 FINAL  Final  Culture, blood (routine x 2)     Status: None   Collection Time: 04/27/19 10:14 AM   Specimen: BLOOD  Result Value Ref Range Status   Specimen Description   Final    BLOOD LEFT ARM Performed at [facility] [address], [place], [place] [address]    Special Requests   Final    BOTTLES DRAWN AEROBIC ONLY Blood Culture adequate volume Performed at [facility] [address], [place], [place] [address]    Culture   Final    NO GROWTH 5 DAYS Performed at [facility], [place] [address], [place], [place] [address]    Report Status 05/02/2019 FINAL  Final   Time spent: 51min  SIGNED:   Marylu Lund, MD  [facility] 05/05/2019, 11:22 AM  If 7PM-7AM, please contact night-coverage

## 2019-05-05 NOTE — Progress Notes (Signed)
Physical Therapy Treatment Patient Details Name: [name] [name] MRN: [id number] DOB: 07/15/1921 Today's Date: 05/05/2019    History of Present Illness 86 yo male admitted with UTI, possible cholelithiasis.  Pt found to have cholangitis with enterococcal bacteremia s/p choledocholithiasis removal and sphincterotomy (04/27/19) and developed rectal bleeding (maroon with clots), s/p 1 unit PRBCs 04/30/19.  Hx of aortic stenosis, CHF, pacemaker, prostate, Ca, bladder Ca, macular degeneration, DVT    PT Comments    Pt continues to participate well.    Follow Up Recommendations  Home health PT;Supervision/Assistance - 24 hour     Equipment Recommendations  None recommended by PT    Recommendations for Other Services       Precautions / Restrictions Precautions Precautions: Fall Precaution Comments: R LQ urostomy, R nephrostomy Restrictions Weight Bearing Restrictions: No    Mobility  Bed Mobility               General bed mobility comments: oob in recliner  Transfers Overall transfer level: Needs assistance Equipment used: Rolling walker (2 wheeled) Transfers: Sit to/from Stand Sit to Stand: Min assist         General transfer comment: Min A from low toilet. Cues for hand placement  Ambulation/Gait Ambulation/Gait assistance: Min guard Gait Distance (Feet): 150 Feet Assistive device: Rolling walker (2 wheeled) Gait Pattern/deviations: Step-through pattern     General Gait Details: Small amount of assist to steady intermittently but mostly min guard.   Stairs             Wheelchair Mobility    Modified Rankin (Stroke Patients Only)       Balance Overall balance assessment: Needs assistance         Standing balance support: Bilateral upper extremity supported Standing balance-Leahy Scale: Poor                              Cognition Arousal/Alertness: Awake/alert Behavior During Therapy: WFL for tasks assessed/performed Overall  Cognitive Status: Within Functional Limits for tasks assessed                                        Exercises      General Comments        Pertinent Vitals/Pain Pain Assessment: No/denies pain    Home Living                      Prior Function            PT Goals (current goals can now be found in the care plan section) Progress towards PT goals: Progressing toward goals    Frequency    Min 3X/week      PT Plan Current plan remains appropriate    Co-evaluation              AM-PAC PT "6 Clicks" Mobility   Outcome Measure  Help needed turning from your back to your side while in a flat bed without using bedrails?: A Little Help needed moving from lying on your back to sitting on the side of a flat bed without using bedrails?: A Little Help needed moving to and from a bed to a chair (including a wheelchair)?: A Little Help needed standing up from a chair using your arms (e.g., wheelchair or bedside chair)?: A Little Help needed to walk in hospital  room?: A Little Help needed climbing 3-5 steps with a railing? : A Little 6 Click Score: 18    End of Session Equipment Utilized During Treatment: Gait belt Activity Tolerance: Patient tolerated treatment well Patient left: in chair;with call bell/phone within reach;with family/visitor present   PT Visit Diagnosis: Unsteadiness on feet (R26.81);Muscle weakness (generalized) (M62.81)     Time: 4034-7425 PT Time Calculation (min) (ACUTE ONLY): 16 min  Charges:  $Gait Training: 8-22 mins                        Doreatha Massed, PT Acute Rehabilitation

## 2019-05-05 NOTE — TOC Transition Note (Signed)
Transition of Care [facility]) - CM/SW Discharge Note   Patient Details  Name: [name] [name] MRN: [id number] Date of Birth: Mar 19, 1922  [facility]) CM/SW Contact:  Dessa Phi, RN Phone Number: 05/05/2019, 11:05 AM   Clinical Narrative: dc/ today home w/HHC-see below [facility], [facility] rep Pam for iv abx initial instruction, & med supplies. No further CM needs.      Final next level of care: [place] Barriers to Discharge: No Barriers Identified   Patient Goals and CMS Choice        Discharge Placement                       Discharge Plan and Services   Discharge Planning Services: CM Consult                      HH Arranged: RN, PT, OT, IV Antibiotics HH Agency: [employer], [place] ([facility]) Date HH Agency Contacted: 04/28/19 Time [place]: 1502 Representative spoke with at [place]: [place] ([place]) Interventions     Readmission Risk Interventions Readmission Risk Prevention Plan 04/26/2019  Transportation Screening Complete  PCP or Specialist Appt within 3-5 Days Complete  HRI or [place] Complete  Social Work Consult for [place] Planning/Counseling Complete  Palliative Care Screening Complete  Medication Review Press photographer) Complete  Some recent data might be hidden

## 2019-05-05 NOTE — Progress Notes (Addendum)
No changes from am assessment. PICC site clean, dry and intact. Pt remains A&Ox4, ambulatory with walker. Discharge instructions reviewed. Questions and or concerns were denied at this time. Hard prescriptions/copied instructions/ HF packet provided.Pt discharged with all belongings.

## 2019-05-06 DIAGNOSIS — K611 Rectal abscess: Secondary | ICD-10-CM | POA: Diagnosis not present

## 2019-05-06 DIAGNOSIS — N184 Chronic kidney disease, stage 4 (severe): Secondary | ICD-10-CM | POA: Diagnosis not present

## 2019-05-06 DIAGNOSIS — I495 Sick sinus syndrome: Secondary | ICD-10-CM | POA: Diagnosis not present

## 2019-05-06 DIAGNOSIS — R7881 Bacteremia: Secondary | ICD-10-CM | POA: Diagnosis not present

## 2019-05-06 DIAGNOSIS — I130 Hypertensive heart and chronic kidney disease with heart failure and stage 1 through stage 4 chronic kidney disease, or unspecified chronic kidney disease: Secondary | ICD-10-CM | POA: Diagnosis not present

## 2019-05-06 DIAGNOSIS — I5033 Acute on chronic diastolic (congestive) heart failure: Secondary | ICD-10-CM | POA: Diagnosis not present

## 2019-05-06 DIAGNOSIS — I350 Nonrheumatic aortic (valve) stenosis: Secondary | ICD-10-CM | POA: Diagnosis not present

## 2019-05-06 DIAGNOSIS — B952 Enterococcus as the cause of diseases classified elsewhere: Secondary | ICD-10-CM | POA: Diagnosis not present

## 2019-05-06 DIAGNOSIS — Z452 Encounter for adjustment and management of vascular access device: Secondary | ICD-10-CM | POA: Diagnosis not present

## 2019-05-06 DIAGNOSIS — D631 Anemia in chronic kidney disease: Secondary | ICD-10-CM | POA: Diagnosis not present

## 2019-05-06 DIAGNOSIS — N390 Urinary tract infection, site not specified: Secondary | ICD-10-CM | POA: Diagnosis not present

## 2019-05-06 DIAGNOSIS — K750 Abscess of liver: Secondary | ICD-10-CM | POA: Diagnosis not present

## 2019-05-07 DIAGNOSIS — N390 Urinary tract infection, site not specified: Secondary | ICD-10-CM | POA: Diagnosis not present

## 2019-05-07 DIAGNOSIS — I350 Nonrheumatic aortic (valve) stenosis: Secondary | ICD-10-CM | POA: Diagnosis not present

## 2019-05-07 DIAGNOSIS — N184 Chronic kidney disease, stage 4 (severe): Secondary | ICD-10-CM | POA: Diagnosis not present

## 2019-05-07 DIAGNOSIS — K750 Abscess of liver: Secondary | ICD-10-CM | POA: Diagnosis not present

## 2019-05-07 DIAGNOSIS — D631 Anemia in chronic kidney disease: Secondary | ICD-10-CM | POA: Diagnosis not present

## 2019-05-07 DIAGNOSIS — R7881 Bacteremia: Secondary | ICD-10-CM | POA: Diagnosis not present

## 2019-05-07 DIAGNOSIS — I495 Sick sinus syndrome: Secondary | ICD-10-CM | POA: Diagnosis not present

## 2019-05-07 DIAGNOSIS — B952 Enterococcus as the cause of diseases classified elsewhere: Secondary | ICD-10-CM | POA: Diagnosis not present

## 2019-05-07 DIAGNOSIS — I5033 Acute on chronic diastolic (congestive) heart failure: Secondary | ICD-10-CM | POA: Diagnosis not present

## 2019-05-07 DIAGNOSIS — Z452 Encounter for adjustment and management of vascular access device: Secondary | ICD-10-CM | POA: Diagnosis not present

## 2019-05-07 DIAGNOSIS — I130 Hypertensive heart and chronic kidney disease with heart failure and stage 1 through stage 4 chronic kidney disease, or unspecified chronic kidney disease: Secondary | ICD-10-CM | POA: Diagnosis not present

## 2019-05-07 DIAGNOSIS — K611 Rectal abscess: Secondary | ICD-10-CM | POA: Diagnosis not present

## 2019-05-08 DIAGNOSIS — K750 Abscess of liver: Secondary | ICD-10-CM | POA: Diagnosis not present

## 2019-05-08 DIAGNOSIS — K611 Rectal abscess: Secondary | ICD-10-CM | POA: Diagnosis not present

## 2019-05-09 DIAGNOSIS — K750 Abscess of liver: Secondary | ICD-10-CM | POA: Diagnosis not present

## 2019-05-09 DIAGNOSIS — K611 Rectal abscess: Secondary | ICD-10-CM | POA: Diagnosis not present

## 2019-05-10 ENCOUNTER — Other Ambulatory Visit: Payer: Self-pay | Admitting: *Deleted

## 2019-05-10 DIAGNOSIS — I5033 Acute on chronic diastolic (congestive) heart failure: Secondary | ICD-10-CM | POA: Diagnosis not present

## 2019-05-10 DIAGNOSIS — I495 Sick sinus syndrome: Secondary | ICD-10-CM | POA: Diagnosis not present

## 2019-05-10 DIAGNOSIS — N184 Chronic kidney disease, stage 4 (severe): Secondary | ICD-10-CM | POA: Diagnosis not present

## 2019-05-10 DIAGNOSIS — N390 Urinary tract infection, site not specified: Secondary | ICD-10-CM | POA: Diagnosis not present

## 2019-05-10 DIAGNOSIS — Z792 Long term (current) use of antibiotics: Secondary | ICD-10-CM | POA: Diagnosis not present

## 2019-05-10 DIAGNOSIS — K750 Abscess of liver: Secondary | ICD-10-CM | POA: Diagnosis not present

## 2019-05-10 DIAGNOSIS — K611 Rectal abscess: Secondary | ICD-10-CM | POA: Diagnosis not present

## 2019-05-10 DIAGNOSIS — R7881 Bacteremia: Secondary | ICD-10-CM | POA: Diagnosis not present

## 2019-05-10 DIAGNOSIS — I350 Nonrheumatic aortic (valve) stenosis: Secondary | ICD-10-CM | POA: Diagnosis not present

## 2019-05-10 DIAGNOSIS — I130 Hypertensive heart and chronic kidney disease with heart failure and stage 1 through stage 4 chronic kidney disease, or unspecified chronic kidney disease: Secondary | ICD-10-CM | POA: Diagnosis not present

## 2019-05-10 DIAGNOSIS — D631 Anemia in chronic kidney disease: Secondary | ICD-10-CM | POA: Diagnosis not present

## 2019-05-10 DIAGNOSIS — R69 Illness, unspecified: Secondary | ICD-10-CM | POA: Diagnosis not present

## 2019-05-10 DIAGNOSIS — B952 Enterococcus as the cause of diseases classified elsewhere: Secondary | ICD-10-CM | POA: Diagnosis not present

## 2019-05-10 DIAGNOSIS — Z452 Encounter for adjustment and management of vascular access device: Secondary | ICD-10-CM | POA: Diagnosis not present

## 2019-05-10 NOTE — Patient Outreach (Signed)
[place] [facility]) Care Management  05/10/2019  JAVEL HERSH 07-12-1921 [id number]    EMMI-GENERAL DISCHARGE-RESOLVED RED ON EMMI ALERT Day #1 Date:05/07/2019 Red Alert Reason: No f/u scheduled appointment  Outreach #1 RN spoke with pt's POA daughter [name] who confirms pt has a scheduled appointments with his primary provider on 2/24 and pending one with his CAD provider. Confirms [facility] involvement with PT/OT/RN. No other issues or needs at this time.  Plan: Case will be closed with all needs addressed at this time.  Raina Mina, RN Care Management Coordinator [place] Office [phone]

## 2019-05-11 DIAGNOSIS — N390 Urinary tract infection, site not specified: Secondary | ICD-10-CM | POA: Diagnosis not present

## 2019-05-11 DIAGNOSIS — N184 Chronic kidney disease, stage 4 (severe): Secondary | ICD-10-CM | POA: Diagnosis not present

## 2019-05-11 DIAGNOSIS — I350 Nonrheumatic aortic (valve) stenosis: Secondary | ICD-10-CM | POA: Diagnosis not present

## 2019-05-11 DIAGNOSIS — K611 Rectal abscess: Secondary | ICD-10-CM | POA: Diagnosis not present

## 2019-05-11 DIAGNOSIS — I495 Sick sinus syndrome: Secondary | ICD-10-CM | POA: Diagnosis not present

## 2019-05-11 DIAGNOSIS — K750 Abscess of liver: Secondary | ICD-10-CM | POA: Diagnosis not present

## 2019-05-11 DIAGNOSIS — I130 Hypertensive heart and chronic kidney disease with heart failure and stage 1 through stage 4 chronic kidney disease, or unspecified chronic kidney disease: Secondary | ICD-10-CM | POA: Diagnosis not present

## 2019-05-11 DIAGNOSIS — Z452 Encounter for adjustment and management of vascular access device: Secondary | ICD-10-CM | POA: Diagnosis not present

## 2019-05-11 DIAGNOSIS — R7881 Bacteremia: Secondary | ICD-10-CM | POA: Diagnosis not present

## 2019-05-11 DIAGNOSIS — B952 Enterococcus as the cause of diseases classified elsewhere: Secondary | ICD-10-CM | POA: Diagnosis not present

## 2019-05-11 DIAGNOSIS — D631 Anemia in chronic kidney disease: Secondary | ICD-10-CM | POA: Diagnosis not present

## 2019-05-11 DIAGNOSIS — I5033 Acute on chronic diastolic (congestive) heart failure: Secondary | ICD-10-CM | POA: Diagnosis not present

## 2019-05-12 DIAGNOSIS — I495 Sick sinus syndrome: Secondary | ICD-10-CM | POA: Diagnosis not present

## 2019-05-12 DIAGNOSIS — I350 Nonrheumatic aortic (valve) stenosis: Secondary | ICD-10-CM | POA: Diagnosis not present

## 2019-05-12 DIAGNOSIS — I5033 Acute on chronic diastolic (congestive) heart failure: Secondary | ICD-10-CM | POA: Diagnosis not present

## 2019-05-12 DIAGNOSIS — I130 Hypertensive heart and chronic kidney disease with heart failure and stage 1 through stage 4 chronic kidney disease, or unspecified chronic kidney disease: Secondary | ICD-10-CM | POA: Diagnosis not present

## 2019-05-12 DIAGNOSIS — B952 Enterococcus as the cause of diseases classified elsewhere: Secondary | ICD-10-CM | POA: Diagnosis not present

## 2019-05-12 DIAGNOSIS — N184 Chronic kidney disease, stage 4 (severe): Secondary | ICD-10-CM | POA: Diagnosis not present

## 2019-05-12 DIAGNOSIS — N390 Urinary tract infection, site not specified: Secondary | ICD-10-CM | POA: Diagnosis not present

## 2019-05-12 DIAGNOSIS — R7881 Bacteremia: Secondary | ICD-10-CM | POA: Diagnosis not present

## 2019-05-12 DIAGNOSIS — Z452 Encounter for adjustment and management of vascular access device: Secondary | ICD-10-CM | POA: Diagnosis not present

## 2019-05-12 DIAGNOSIS — K750 Abscess of liver: Secondary | ICD-10-CM | POA: Diagnosis not present

## 2019-05-12 DIAGNOSIS — D631 Anemia in chronic kidney disease: Secondary | ICD-10-CM | POA: Diagnosis not present

## 2019-05-12 DIAGNOSIS — K611 Rectal abscess: Secondary | ICD-10-CM | POA: Diagnosis not present

## 2019-05-13 ENCOUNTER — Other Ambulatory Visit (HOSPITAL_COMMUNITY): Payer: Medicare HMO

## 2019-05-13 DIAGNOSIS — N390 Urinary tract infection, site not specified: Secondary | ICD-10-CM | POA: Diagnosis not present

## 2019-05-13 DIAGNOSIS — K611 Rectal abscess: Secondary | ICD-10-CM | POA: Diagnosis not present

## 2019-05-13 DIAGNOSIS — B952 Enterococcus as the cause of diseases classified elsewhere: Secondary | ICD-10-CM | POA: Diagnosis not present

## 2019-05-13 DIAGNOSIS — I5033 Acute on chronic diastolic (congestive) heart failure: Secondary | ICD-10-CM | POA: Diagnosis not present

## 2019-05-13 DIAGNOSIS — N184 Chronic kidney disease, stage 4 (severe): Secondary | ICD-10-CM | POA: Diagnosis not present

## 2019-05-13 DIAGNOSIS — K750 Abscess of liver: Secondary | ICD-10-CM | POA: Diagnosis not present

## 2019-05-13 DIAGNOSIS — D631 Anemia in chronic kidney disease: Secondary | ICD-10-CM | POA: Diagnosis not present

## 2019-05-13 DIAGNOSIS — I130 Hypertensive heart and chronic kidney disease with heart failure and stage 1 through stage 4 chronic kidney disease, or unspecified chronic kidney disease: Secondary | ICD-10-CM | POA: Diagnosis not present

## 2019-05-13 DIAGNOSIS — I350 Nonrheumatic aortic (valve) stenosis: Secondary | ICD-10-CM | POA: Diagnosis not present

## 2019-05-13 DIAGNOSIS — R7881 Bacteremia: Secondary | ICD-10-CM | POA: Diagnosis not present

## 2019-05-13 DIAGNOSIS — I495 Sick sinus syndrome: Secondary | ICD-10-CM | POA: Diagnosis not present

## 2019-05-13 DIAGNOSIS — Z452 Encounter for adjustment and management of vascular access device: Secondary | ICD-10-CM | POA: Diagnosis not present

## 2019-05-13 IMAGING — XA IR EXCHANGE NEPHROSTOMY RIGHT
1 series · 3 of 3 positions shown · non-contrast
Comparison: None.

INDICATION: Routine exchange.
TECHNIQUE: Informed written consent was obtained from the patient after a
discussion of the risks, benefits and alternatives to treatment.
Questions regarding the procedure were encouraged and answered. A
timeout was performed prior to the initiation of the procedure.

[Series 300: tube placements · 3 of 3 slices shown]
[im 1/3]
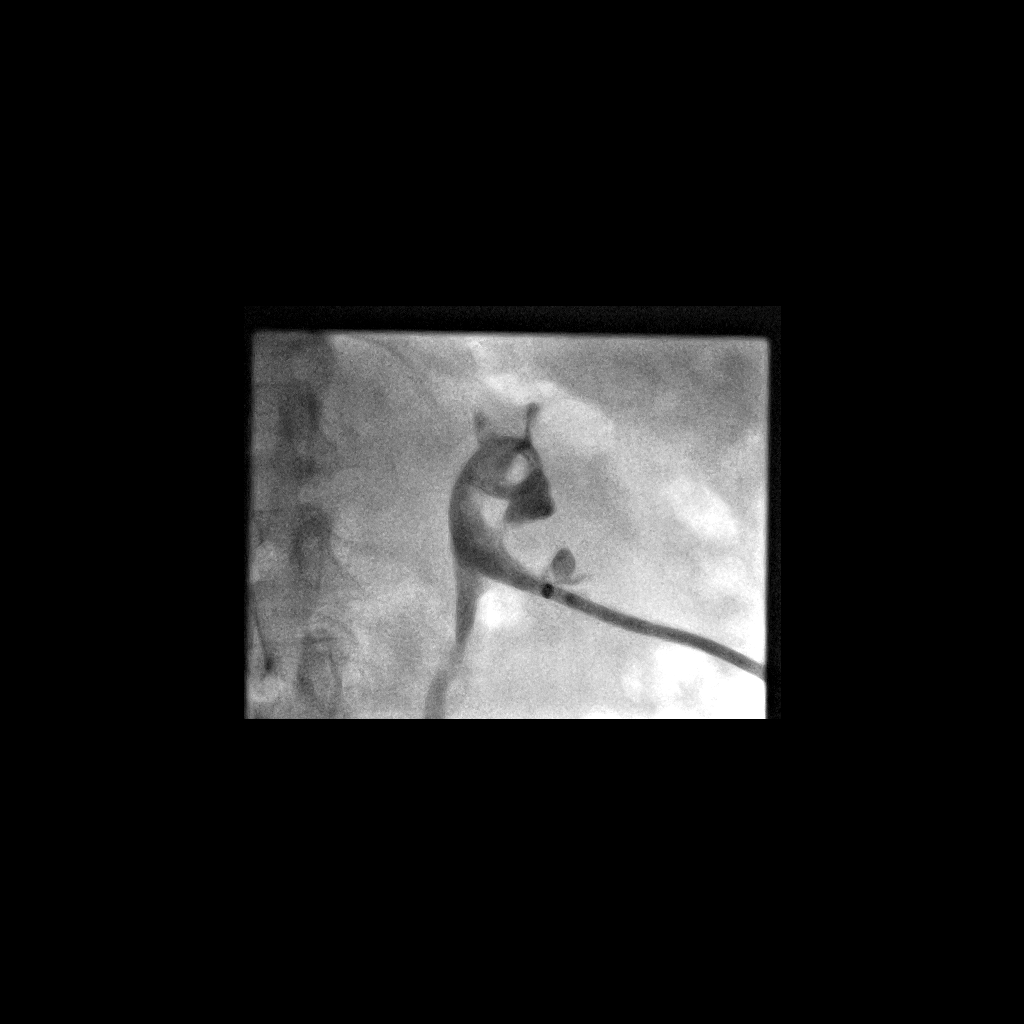
[im 2/3]
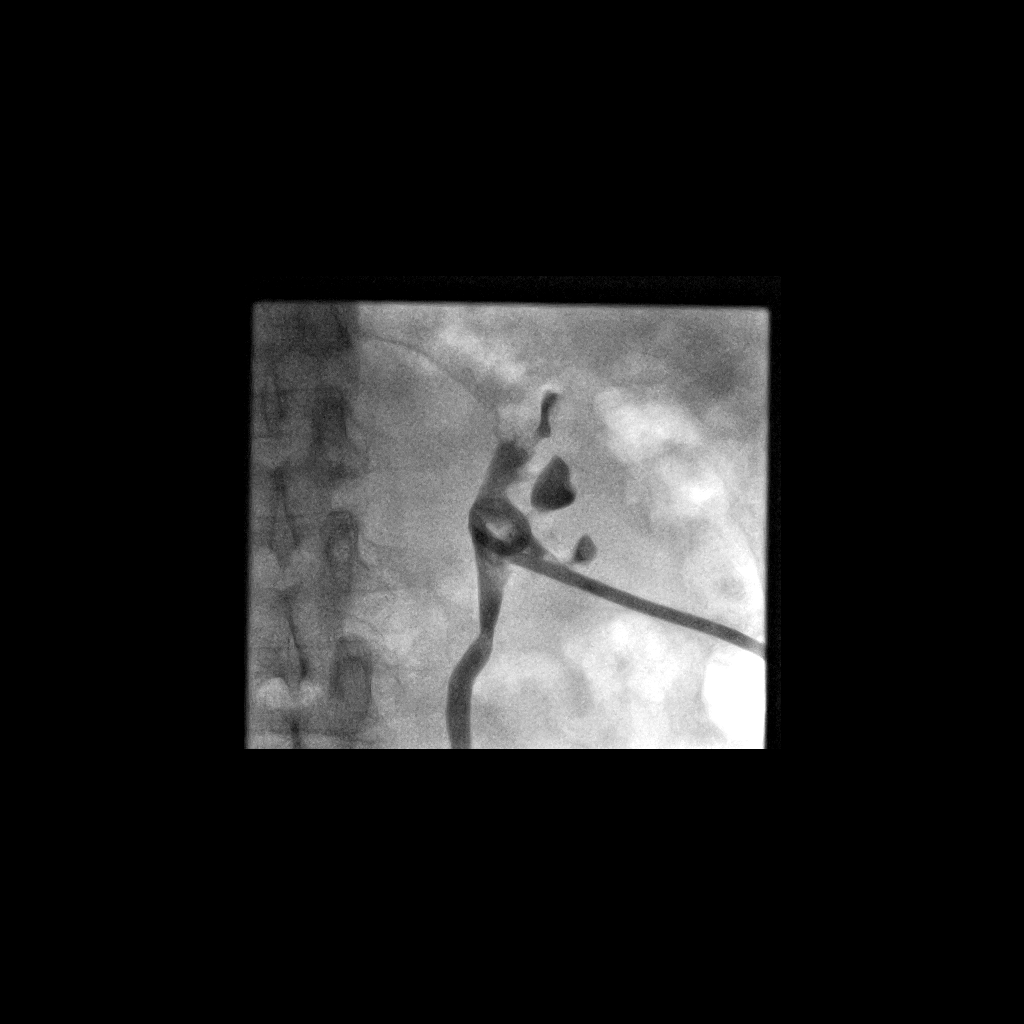
[im 3/3]
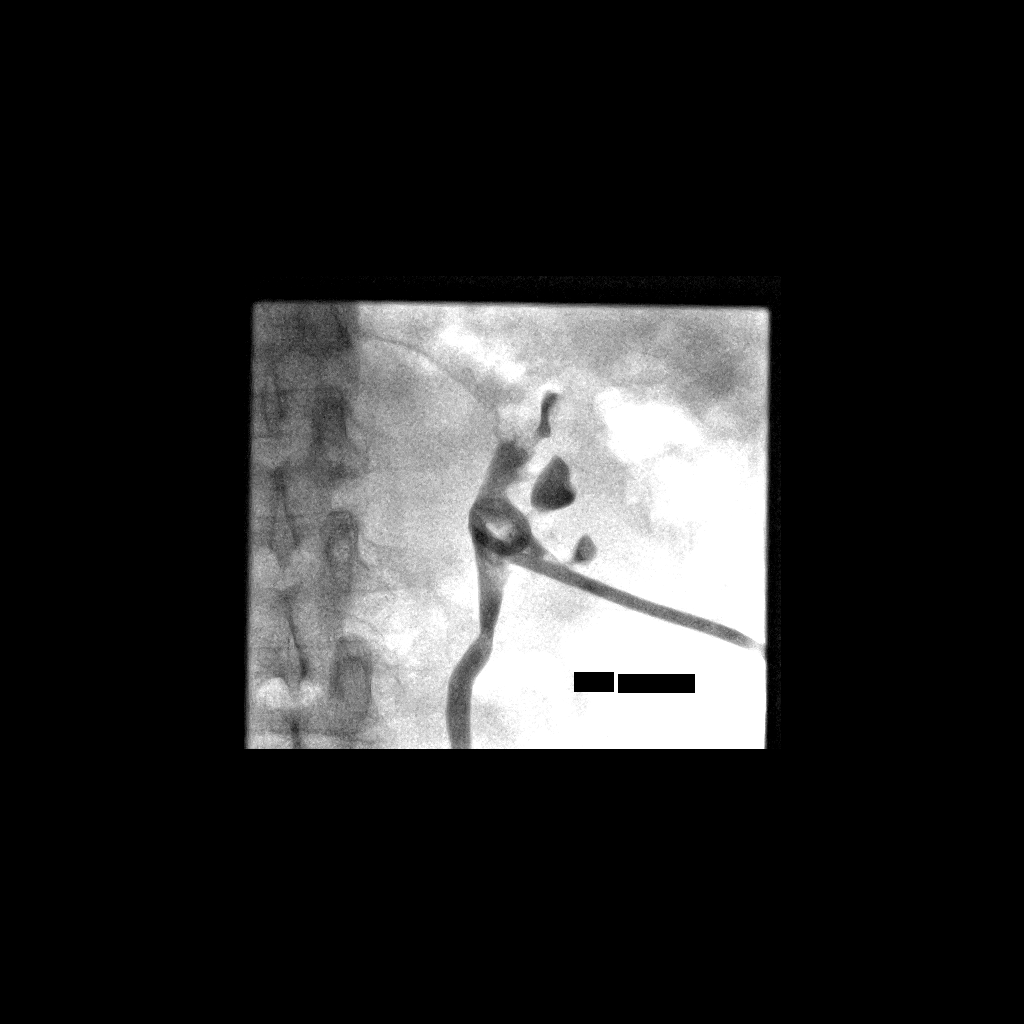

[3 of 3 positions shown; findings below may reference images not displayed]

Note, patient previously has had a right-sided nephroureteral
catheter via ileal conduit however the nephroureteral catheter was
previously inter vertically removed requiring placement of a
right-sided nephrostomy catheter.

Given patient's advanced age and the fact that he has experienced 3
intermittent nephroureteral catheter removals, the decision has been
made to proceed with routine percutaneous nephrostomy catheter
exchanges in lieu of attempted conversion to another nephroureteral
catheter.

EXAM:
FLUOROSCOPIC GUIDED RIGHT SIDED NEPHROSTOMY CATHETER EXCHANGE
CONTRAST:  15 mL Rsovue-AYY administered into the collecting system

FLUOROSCOPY TIME:  2 Minutes 6 seconds (22 mGy)

COMPLICATIONS:
None immediate.
The right flank and external portion of existing nephrostomy
catheter were prepped and draped in the usual sterile fashion. A
sterile drape was applied covering the operative field. Maximum
barrier sterile technique with sterile gowns and gloves were used
for the procedure. A timeout was performed prior to the initiation
of the procedure.

A pre procedural spot fluoroscopic image was obtained after contrast
was injected via the existing nephrostomy catheter demonstrating
appropriate positioning within the diminutive right renal pelvis.
The existing nephrostomy catheter was cut and cannulated with a
short Amplatz wire which was coiled within the renal pelvis. Under
intermittent fluoroscopic guidance, the existing nephrostomy
catheter was exchanged for a new 10.2 Kelly Yojana Hachito drainage
catheter. Contrast injection confirmed appropriate positioning
within the renal pelvis and a post exchange fluoroscopic image was
obtained. The catheter was locked and secured to the skin with a
suture. A dressing was placed. The patient tolerated the procedure
well without immediate postprocedural complication.
FINDINGS: The existing nephrostomy catheter is appropriately positioned and
functioning.

Given presence of the extremely small right-sided renal collecting
system, decision was made to convert the existing all-purpose
drainage catheter to Fallon Jim catheter.

After successful fluoroscopic guided exchange, the new 10.2 Nanaba Kofi
Abo Hedr Shibi catheter is coiled and locked within the right renal
pelvis.
IMPRESSION: Successful fluoroscopic guided exchange of right sided 10.2 Nanaba Kofi
Abo Hedr Shibi nephrostomy catheter.

## 2019-05-14 ENCOUNTER — Other Ambulatory Visit (HOSPITAL_COMMUNITY): Payer: Medicare HMO

## 2019-05-14 DIAGNOSIS — B952 Enterococcus as the cause of diseases classified elsewhere: Secondary | ICD-10-CM | POA: Diagnosis not present

## 2019-05-14 DIAGNOSIS — K750 Abscess of liver: Secondary | ICD-10-CM | POA: Diagnosis not present

## 2019-05-14 DIAGNOSIS — I495 Sick sinus syndrome: Secondary | ICD-10-CM | POA: Diagnosis not present

## 2019-05-14 DIAGNOSIS — N390 Urinary tract infection, site not specified: Secondary | ICD-10-CM | POA: Diagnosis not present

## 2019-05-14 DIAGNOSIS — D631 Anemia in chronic kidney disease: Secondary | ICD-10-CM | POA: Diagnosis not present

## 2019-05-14 DIAGNOSIS — I130 Hypertensive heart and chronic kidney disease with heart failure and stage 1 through stage 4 chronic kidney disease, or unspecified chronic kidney disease: Secondary | ICD-10-CM | POA: Diagnosis not present

## 2019-05-14 DIAGNOSIS — N184 Chronic kidney disease, stage 4 (severe): Secondary | ICD-10-CM | POA: Diagnosis not present

## 2019-05-14 DIAGNOSIS — K611 Rectal abscess: Secondary | ICD-10-CM | POA: Diagnosis not present

## 2019-05-14 DIAGNOSIS — R7881 Bacteremia: Secondary | ICD-10-CM | POA: Diagnosis not present

## 2019-05-14 DIAGNOSIS — Z452 Encounter for adjustment and management of vascular access device: Secondary | ICD-10-CM | POA: Diagnosis not present

## 2019-05-14 DIAGNOSIS — I5033 Acute on chronic diastolic (congestive) heart failure: Secondary | ICD-10-CM | POA: Diagnosis not present

## 2019-05-14 DIAGNOSIS — I350 Nonrheumatic aortic (valve) stenosis: Secondary | ICD-10-CM | POA: Diagnosis not present

## 2019-05-15 DIAGNOSIS — K750 Abscess of liver: Secondary | ICD-10-CM | POA: Diagnosis not present

## 2019-05-15 DIAGNOSIS — K611 Rectal abscess: Secondary | ICD-10-CM | POA: Diagnosis not present

## 2019-05-16 DIAGNOSIS — K611 Rectal abscess: Secondary | ICD-10-CM | POA: Diagnosis not present

## 2019-05-16 DIAGNOSIS — K750 Abscess of liver: Secondary | ICD-10-CM | POA: Diagnosis not present

## 2019-05-17 DIAGNOSIS — I5033 Acute on chronic diastolic (congestive) heart failure: Secondary | ICD-10-CM | POA: Diagnosis not present

## 2019-05-17 DIAGNOSIS — I130 Hypertensive heart and chronic kidney disease with heart failure and stage 1 through stage 4 chronic kidney disease, or unspecified chronic kidney disease: Secondary | ICD-10-CM | POA: Diagnosis not present

## 2019-05-17 DIAGNOSIS — K750 Abscess of liver: Secondary | ICD-10-CM | POA: Diagnosis not present

## 2019-05-17 DIAGNOSIS — D631 Anemia in chronic kidney disease: Secondary | ICD-10-CM | POA: Diagnosis not present

## 2019-05-17 DIAGNOSIS — Z452 Encounter for adjustment and management of vascular access device: Secondary | ICD-10-CM | POA: Diagnosis not present

## 2019-05-17 DIAGNOSIS — N390 Urinary tract infection, site not specified: Secondary | ICD-10-CM | POA: Diagnosis not present

## 2019-05-17 DIAGNOSIS — R7881 Bacteremia: Secondary | ICD-10-CM | POA: Diagnosis not present

## 2019-05-17 DIAGNOSIS — B952 Enterococcus as the cause of diseases classified elsewhere: Secondary | ICD-10-CM | POA: Diagnosis not present

## 2019-05-17 DIAGNOSIS — I350 Nonrheumatic aortic (valve) stenosis: Secondary | ICD-10-CM | POA: Diagnosis not present

## 2019-05-17 DIAGNOSIS — N184 Chronic kidney disease, stage 4 (severe): Secondary | ICD-10-CM | POA: Diagnosis not present

## 2019-05-17 DIAGNOSIS — K611 Rectal abscess: Secondary | ICD-10-CM | POA: Diagnosis not present

## 2019-05-17 DIAGNOSIS — I495 Sick sinus syndrome: Secondary | ICD-10-CM | POA: Diagnosis not present

## 2019-05-18 ENCOUNTER — Other Ambulatory Visit (HOSPITAL_COMMUNITY): Payer: Self-pay | Admitting: Interventional Radiology

## 2019-05-18 ENCOUNTER — Ambulatory Visit (HOSPITAL_COMMUNITY)
Admission: RE | Admit: 2019-05-18 | Discharge: 2019-05-18 | Disposition: A | Discharge status: Home / Self Care | Payer: Medicare HMO | Source: Ambulatory Visit | Attending: Interventional Radiology | Admitting: Interventional Radiology

## 2019-05-18 ENCOUNTER — Telehealth: Payer: Self-pay

## 2019-05-18 ENCOUNTER — Other Ambulatory Visit: Payer: Self-pay

## 2019-05-18 ENCOUNTER — Other Ambulatory Visit (HOSPITAL_COMMUNITY): Payer: Self-pay | Admitting: Family Medicine

## 2019-05-18 DIAGNOSIS — N135 Crossing vessel and stricture of ureter without hydronephrosis: Secondary | ICD-10-CM | POA: Diagnosis not present

## 2019-05-18 DIAGNOSIS — Z452 Encounter for adjustment and management of vascular access device: Secondary | ICD-10-CM | POA: Diagnosis not present

## 2019-05-18 DIAGNOSIS — K750 Abscess of liver: Secondary | ICD-10-CM | POA: Diagnosis not present

## 2019-05-18 DIAGNOSIS — R7881 Bacteremia: Secondary | ICD-10-CM | POA: Diagnosis not present

## 2019-05-18 DIAGNOSIS — I5033 Acute on chronic diastolic (congestive) heart failure: Secondary | ICD-10-CM | POA: Diagnosis not present

## 2019-05-18 DIAGNOSIS — B952 Enterococcus as the cause of diseases classified elsewhere: Secondary | ICD-10-CM | POA: Diagnosis not present

## 2019-05-18 DIAGNOSIS — Z436 Encounter for attention to other artificial openings of urinary tract: Secondary | ICD-10-CM | POA: Insufficient documentation

## 2019-05-18 DIAGNOSIS — I350 Nonrheumatic aortic (valve) stenosis: Secondary | ICD-10-CM | POA: Diagnosis not present

## 2019-05-18 DIAGNOSIS — K611 Rectal abscess: Secondary | ICD-10-CM | POA: Diagnosis not present

## 2019-05-18 DIAGNOSIS — I495 Sick sinus syndrome: Secondary | ICD-10-CM | POA: Diagnosis not present

## 2019-05-18 DIAGNOSIS — D631 Anemia in chronic kidney disease: Secondary | ICD-10-CM | POA: Diagnosis not present

## 2019-05-18 DIAGNOSIS — N390 Urinary tract infection, site not specified: Secondary | ICD-10-CM | POA: Diagnosis not present

## 2019-05-18 DIAGNOSIS — I130 Hypertensive heart and chronic kidney disease with heart failure and stage 1 through stage 4 chronic kidney disease, or unspecified chronic kidney disease: Secondary | ICD-10-CM | POA: Diagnosis not present

## 2019-05-18 DIAGNOSIS — N184 Chronic kidney disease, stage 4 (severe): Secondary | ICD-10-CM | POA: Diagnosis not present

## 2019-05-18 HISTORY — PX: IR NEPHROSTOMY EXCHANGE RIGHT: IMG6070

## 2019-05-18 MED ORDER — IOHEXOL 300 MG/ML  SOLN
50.0000 mL | Freq: Once | INTRAMUSCULAR | Status: AC | PRN
  Administered 2019-05-18: 10 mL

## 2019-05-18 MED ORDER — LIDOCAINE HCL 1 % IJ SOLN
INTRAMUSCULAR | Status: AC
  Filled 2019-05-18: qty 20

## 2019-05-18 NOTE — Telephone Encounter (Signed)
Palliative Medicine RN Note: Rec'd call from [facility] at [facility] asking if we had made contact with Mr Grayer for outpatient palliative care. Upon chart review, note that OP PC wasn't ordered at discharge and was therefore not set up. I called Morey Hummingbird back to let her know, and we discussed available providers in patient's area. She will make contact with family/OP PC provider to make the referral from Mr Hepworth's PCP office.  Marjie Skiff Everitt Wenner, RN, BSN, [facility] Palliative Medicine Team 05/18/2019 9:34 AM Office [phone]

## 2019-05-18 NOTE — Procedures (Signed)
Chronic right pcn  S/p fluoro exchg  No comp Stable Gravity drain Full report in pacs

## 2019-05-19 ENCOUNTER — Ambulatory Visit (INDEPENDENT_AMBULATORY_CARE_PROVIDER_SITE_OTHER): Payer: Medicare HMO | Admitting: Internal Medicine

## 2019-05-19 ENCOUNTER — Telehealth: Payer: Self-pay

## 2019-05-19 DIAGNOSIS — K611 Rectal abscess: Secondary | ICD-10-CM | POA: Diagnosis not present

## 2019-05-19 DIAGNOSIS — R7881 Bacteremia: Secondary | ICD-10-CM

## 2019-05-19 DIAGNOSIS — N184 Chronic kidney disease, stage 4 (severe): Secondary | ICD-10-CM | POA: Diagnosis not present

## 2019-05-19 DIAGNOSIS — N390 Urinary tract infection, site not specified: Secondary | ICD-10-CM | POA: Diagnosis not present

## 2019-05-19 DIAGNOSIS — K750 Abscess of liver: Secondary | ICD-10-CM | POA: Diagnosis not present

## 2019-05-19 DIAGNOSIS — I5033 Acute on chronic diastolic (congestive) heart failure: Secondary | ICD-10-CM | POA: Diagnosis not present

## 2019-05-19 DIAGNOSIS — I350 Nonrheumatic aortic (valve) stenosis: Secondary | ICD-10-CM | POA: Diagnosis not present

## 2019-05-19 DIAGNOSIS — Z452 Encounter for adjustment and management of vascular access device: Secondary | ICD-10-CM | POA: Diagnosis not present

## 2019-05-19 DIAGNOSIS — I130 Hypertensive heart and chronic kidney disease with heart failure and stage 1 through stage 4 chronic kidney disease, or unspecified chronic kidney disease: Secondary | ICD-10-CM | POA: Diagnosis not present

## 2019-05-19 DIAGNOSIS — I495 Sick sinus syndrome: Secondary | ICD-10-CM | POA: Diagnosis not present

## 2019-05-19 DIAGNOSIS — B952 Enterococcus as the cause of diseases classified elsewhere: Secondary | ICD-10-CM | POA: Diagnosis not present

## 2019-05-19 DIAGNOSIS — D631 Anemia in chronic kidney disease: Secondary | ICD-10-CM | POA: Diagnosis not present

## 2019-05-19 MED ORDER — CEFTRIAXONE IV (FOR PTA / DISCHARGE USE ONLY): 2.0000 g | Freq: Two times a day (BID) | INTRAVENOUS | Status: AC

## 2019-05-19 MED ORDER — AMPICILLIN IV (FOR PTA / DISCHARGE USE ONLY): 2.0000 g | Freq: Three times a day (TID) | INTRAVENOUS | Status: AC

## 2019-05-19 NOTE — Progress Notes (Signed)
[place] for Infectious Disease  Patient Active Problem List   Diagnosis Date Noted  . Enterococcal bacteremia 04/27/2019    Priority: High  . Choledocholithiasis 04/27/2019    Priority: High  . Sepsis ([place]) 02/04/2014    Priority: High  . Acute lower UTI 07/30/2013    Priority: High  . Status post ileal conduit ([place]) 04/27/2019  . Macrocytic anemia 04/27/2019  . HOH (hard of hearing) 04/27/2019  . History of prostate cancer 04/27/2019  . Abnormal liver function 04/25/2019  . Acute on chronic diastolic CHF (congestive heart failure) ([place]) 12/09/2018  . Legally blind 06/13/2017  . Bladder cancer ([place]) 06/13/2017  . CKD (chronic kidney disease), stage IV ([place]) 07/29/2013  . Hydronephrosis, right 07/15/2013  . Kidney stone 08/20/2011  . Obstructive uropathy 08/16/2011  . Gout 08/16/2011  . Aortic stenosis 08/16/2011  . Hypertension 10/02/2010  . Pacemaker 10/02/2010  . Sinus bradycardia 10/02/2010    Patient's Medications  New Prescriptions   No medications on file  Previous Medications   ACETAMINOPHEN (TYLENOL) 500 MG TABLET    Take 1,000 mg by mouth every 8 (eight) hours as needed for mild pain or headache.   ASPIRIN EC 81 MG TABLET    Take 81 mg by mouth every morning.   BESIFLOXACIN HCL (BESIVANCE) 0.6 % SUSP    Apply 1 drop to eye See admin instructions. Only uses before eye shot he receives every 10 weeks.   CHOLECALCIFEROL (VITAMIN D-3) 1000 UNITS CAPS    Take 1 capsule by mouth every morning.   COLCHICINE 0.6 MG TABLET    Take 0.6 mg by mouth as needed (gout).    DOCUSATE SODIUM (COLACE) 100 MG CAPSULE    Take 300 mg by mouth daily.    FUROSEMIDE (LASIX) 20 MG TABLET    Take 1 tablet (20 mg total) by mouth daily.   HYPROM-NAPHAZ-POLYSORB-ZN SULF (CLEAR EYES COMPLETE OP)    Apply 1-2 drops to eye daily as needed (dry eyes).   LEVOTHYROXINE (SYNTHROID) 25 MCG TABLET    Take 25 mcg by mouth every morning.   METOPROLOL SUCCINATE (TOPROL-XL) 12.5 MG  TB24 24 HR TABLET    Take 12.5 mg by mouth daily.   MULTIPLE VITAMINS-MINERALS (VITEYES AREDS ADVANCED PO)    Take 2 capsules by mouth daily.   OMEGA-3 KRILL OIL PO    Take 1 tablet by mouth every morning.   PANTOPRAZOLE (PROTONIX) 40 MG TABLET    Take 1 tablet (40 mg total) by mouth daily.   POLYETHYLENE GLYCOL POWDER (GLYCOLAX/MIRALAX) 17 GM/SCOOP POWDER    Take 17 g by mouth daily as needed for mild constipation. Takes 17grams (1 capful) as needed.   ULORIC 40 MG TABLET    Take 1 tablet by mouth daily.  Modified Medications   Modified Medication Previous Medication   AMPICILLIN IVPB ampicillin IVPB      Inject 2 g into the vein every 8 (eight) hours for 4 days. Indication:  Enterococcal bacteremia, presumptive endocarditis Last Day of Therapy:  05/23/2019 Labs - Once weekly:  CBC/D and BMP    Inject 2 g into the vein every 8 (eight) hours for 21 days. Indication:  Enterococcal bacteremia, presumptive endocarditis Last Day of Therapy:  05/23/2019 Labs - Once weekly:  CBC/D and BMP   CEFTRIAXONE (ROCEPHIN) IVPB cefTRIAXone (ROCEPHIN) IVPB      Inject 2 g into the vein every 12 (twelve) hours for 4 days. Indication:  Enterococcal  bacteremia, presumptive endocarditis Last Day of Therapy:  05/23/2019 Labs - Once weekly:  CBC/D and BMP    Inject 2 g into the vein every 12 (twelve) hours for 23 days. Indication:  Enterococcal bacteremia, presumptive endocarditis Last Day of Therapy:  05/23/2019 Labs - Once weekly:  CBC/D and BMP  Discontinued Medications   No medications on file    Subjective: [name] [name] is in with his daughter for his hospital follow-up visit.  He has a history of prostate and bladder cancer who had previously undergone cystectomy and ileal conduit formation.  He also had obstructive uropathy with right hydronephrosis leading to right nephrostomy.  He was admitted on 04/25/2019 with low-grade fever of 100.5 degrees, weakness and acute on chronic left flank pain.  His white blood  cell count was 21,800.  His liver enzymes were elevated.  He was started on broad empiric antibiotic therapy.  CT scan showed a moderate right pleural effusion and choledocholithiasis.  There was no evidence of acute cholecystitis or pyelonephritis.  Both admission blood cultures grew Enterococcus. Urine culture obtained from the ileal conduit grew multiple species.  He underwent ERCP with sphincterotomy and removal of multiple gallstones from his common bile duct.  He also has a history of sinus bradycardia requiring a permanent pacemaker.  He also has calcific aortic stenosis.  There was no evidence of endocarditis on TTE.  He and his daughter chose to not pursue TEE because of the risk of further sedation.  I elected to complete his therapy with 4 weeks of IV ampicillin and ceftriaxone.  He will complete therapy on 05/23/2019.  He is still weak but feeling 100% better than he did when he went into the hospital.  He has not had any problems tolerating his antibiotics or PICC.  Review of Systems: Review of Systems  Constitutional: Negative for chills, diaphoresis and fever.  Respiratory: Negative for cough.   Cardiovascular: Negative for chest pain.  Gastrointestinal: Negative for abdominal pain, diarrhea, nausea and vomiting.  Skin: Negative for rash.    Past Medical History:  Diagnosis Date  . Aortic valve stenosis    a. mild to moderate by echo 2013  . Bradycardia    MDT dual chamber pacemaker  . Cancer [facility])    bladder cancer  . Chronic kidney disease   . Deep venous thrombosis (HCC)    hx of  LEFT ARM AFTER PACEMAKER INSERTION ABOUT 6 YRS AGO  . Depression   . Gout   . Hypertension   . Kidney stone   . Macular degeneration    PT STATES HIS VISION STILL OKAY FOR DRIVING  . Osteoporosis   . Prostate cancer ([place])    AND BLADDER CANCER - S/P URETEROILEAL CONDUIT  . Renal disorder   . S/P ileal conduit (HCC)         Social History   Tobacco Use  . Smoking status: Former  Smoker    Types: Cigarettes    Quit date: 03/25/1952    Years since quitting: 67.1  . Smokeless tobacco: Never Used  Substance Use Topics  . Alcohol use: No  . Drug use: No    Family History  Problem Relation Age of Onset  . Pancreatic cancer Father 30       died  . Pancreatic cancer Mother 85       died    No Known Allergies  Objective: Vitals:   05/19/19 1532  BP: (!) 180/66  Pulse: 61   There is  no height or weight on file to calculate BMI.  Physical Exam Constitutional:      Comments: He is pleasant and in no distress.  He looks like he is feeling better than when I last saw him in the hospital.  Cardiovascular:     Rate and Rhythm: Normal rate and regular rhythm.     Heart sounds: Murmur present.     Comments: He has a harsh, 3/6 holosystolic murmur heard best at the right upper sternal border.  His pacemaker site looks okay. Pulmonary:     Effort: Pulmonary effort is normal.     Breath sounds: Normal breath sounds.  Abdominal:     Palpations: Abdomen is soft.     Tenderness: There is no abdominal tenderness.  Skin:    Findings: No rash.  Psychiatric:        Mood and Affect: Mood normal.     Lab Results    Problem List Items Addressed This Visit      High   Enterococcal bacteremia    He is improving on therapy for enterococcal bacteremia.  He will complete antibiotic therapy in 4 days and have his PICC removed.  I will arrange a follow-up visit in 4 to 6 weeks.          Michel Bickers, MD [facility] for [place] [facility] [phone] pager   [phone] cell 05/19/2019, 5:12 PM

## 2019-05-19 NOTE — Assessment & Plan Note (Signed)
He is improving on therapy for enterococcal bacteremia.  He will complete antibiotic therapy in 4 days and have his PICC removed.  I will arrange a follow-up visit in 4 to 6 weeks.

## 2019-05-19 NOTE — Telephone Encounter (Signed)
Contacted [facility] to confirm stop date of antibiotics and to pull PICC after completion on 2/28. Confirmed correct date and verbal orders repeated correctly. Patient notified of plan.   Neyra Pettie Lorita Officer, RN

## 2019-05-20 ENCOUNTER — Ambulatory Visit: Payer: Medicare HMO | Admitting: Internal Medicine

## 2019-05-20 DIAGNOSIS — K750 Abscess of liver: Secondary | ICD-10-CM | POA: Diagnosis not present

## 2019-05-20 DIAGNOSIS — E875 Hyperkalemia: Secondary | ICD-10-CM | POA: Diagnosis not present

## 2019-05-20 DIAGNOSIS — E039 Hypothyroidism, unspecified: Secondary | ICD-10-CM | POA: Diagnosis not present

## 2019-05-20 DIAGNOSIS — K611 Rectal abscess: Secondary | ICD-10-CM | POA: Diagnosis not present

## 2019-05-21 DIAGNOSIS — I350 Nonrheumatic aortic (valve) stenosis: Secondary | ICD-10-CM | POA: Diagnosis not present

## 2019-05-21 DIAGNOSIS — K750 Abscess of liver: Secondary | ICD-10-CM | POA: Diagnosis not present

## 2019-05-21 DIAGNOSIS — Z452 Encounter for adjustment and management of vascular access device: Secondary | ICD-10-CM | POA: Diagnosis not present

## 2019-05-21 DIAGNOSIS — I130 Hypertensive heart and chronic kidney disease with heart failure and stage 1 through stage 4 chronic kidney disease, or unspecified chronic kidney disease: Secondary | ICD-10-CM | POA: Diagnosis not present

## 2019-05-21 DIAGNOSIS — I495 Sick sinus syndrome: Secondary | ICD-10-CM | POA: Diagnosis not present

## 2019-05-21 DIAGNOSIS — I5033 Acute on chronic diastolic (congestive) heart failure: Secondary | ICD-10-CM | POA: Diagnosis not present

## 2019-05-21 DIAGNOSIS — K611 Rectal abscess: Secondary | ICD-10-CM | POA: Diagnosis not present

## 2019-05-21 DIAGNOSIS — N184 Chronic kidney disease, stage 4 (severe): Secondary | ICD-10-CM | POA: Diagnosis not present

## 2019-05-21 DIAGNOSIS — N390 Urinary tract infection, site not specified: Secondary | ICD-10-CM | POA: Diagnosis not present

## 2019-05-21 DIAGNOSIS — D631 Anemia in chronic kidney disease: Secondary | ICD-10-CM | POA: Diagnosis not present

## 2019-05-21 DIAGNOSIS — R7881 Bacteremia: Secondary | ICD-10-CM | POA: Diagnosis not present

## 2019-05-21 DIAGNOSIS — B952 Enterococcus as the cause of diseases classified elsewhere: Secondary | ICD-10-CM | POA: Diagnosis not present

## 2019-05-22 DIAGNOSIS — K750 Abscess of liver: Secondary | ICD-10-CM | POA: Diagnosis not present

## 2019-05-22 DIAGNOSIS — K611 Rectal abscess: Secondary | ICD-10-CM | POA: Diagnosis not present

## 2019-05-23 DIAGNOSIS — K750 Abscess of liver: Secondary | ICD-10-CM | POA: Diagnosis not present

## 2019-05-23 DIAGNOSIS — K611 Rectal abscess: Secondary | ICD-10-CM | POA: Diagnosis not present

## 2019-05-24 DIAGNOSIS — I495 Sick sinus syndrome: Secondary | ICD-10-CM | POA: Diagnosis not present

## 2019-05-24 DIAGNOSIS — D631 Anemia in chronic kidney disease: Secondary | ICD-10-CM | POA: Diagnosis not present

## 2019-05-24 DIAGNOSIS — R7881 Bacteremia: Secondary | ICD-10-CM | POA: Diagnosis not present

## 2019-05-24 DIAGNOSIS — Z48815 Encounter for surgical aftercare following surgery on the digestive system: Secondary | ICD-10-CM | POA: Diagnosis not present

## 2019-05-24 DIAGNOSIS — I350 Nonrheumatic aortic (valve) stenosis: Secondary | ICD-10-CM | POA: Diagnosis not present

## 2019-05-24 DIAGNOSIS — N184 Chronic kidney disease, stage 4 (severe): Secondary | ICD-10-CM | POA: Diagnosis not present

## 2019-05-24 DIAGNOSIS — N390 Urinary tract infection, site not specified: Secondary | ICD-10-CM | POA: Diagnosis not present

## 2019-05-24 DIAGNOSIS — Z452 Encounter for adjustment and management of vascular access device: Secondary | ICD-10-CM | POA: Diagnosis not present

## 2019-05-24 DIAGNOSIS — I5033 Acute on chronic diastolic (congestive) heart failure: Secondary | ICD-10-CM | POA: Diagnosis not present

## 2019-05-24 DIAGNOSIS — I130 Hypertensive heart and chronic kidney disease with heart failure and stage 1 through stage 4 chronic kidney disease, or unspecified chronic kidney disease: Secondary | ICD-10-CM | POA: Diagnosis not present

## 2019-05-24 DIAGNOSIS — B952 Enterococcus as the cause of diseases classified elsewhere: Secondary | ICD-10-CM | POA: Diagnosis not present

## 2019-05-25 DIAGNOSIS — B952 Enterococcus as the cause of diseases classified elsewhere: Secondary | ICD-10-CM | POA: Diagnosis not present

## 2019-05-25 DIAGNOSIS — I130 Hypertensive heart and chronic kidney disease with heart failure and stage 1 through stage 4 chronic kidney disease, or unspecified chronic kidney disease: Secondary | ICD-10-CM | POA: Diagnosis not present

## 2019-05-25 DIAGNOSIS — N184 Chronic kidney disease, stage 4 (severe): Secondary | ICD-10-CM | POA: Diagnosis not present

## 2019-05-25 DIAGNOSIS — I495 Sick sinus syndrome: Secondary | ICD-10-CM | POA: Diagnosis not present

## 2019-05-25 DIAGNOSIS — Z452 Encounter for adjustment and management of vascular access device: Secondary | ICD-10-CM | POA: Diagnosis not present

## 2019-05-25 DIAGNOSIS — D631 Anemia in chronic kidney disease: Secondary | ICD-10-CM | POA: Diagnosis not present

## 2019-05-25 DIAGNOSIS — I5033 Acute on chronic diastolic (congestive) heart failure: Secondary | ICD-10-CM | POA: Diagnosis not present

## 2019-05-25 DIAGNOSIS — I350 Nonrheumatic aortic (valve) stenosis: Secondary | ICD-10-CM | POA: Diagnosis not present

## 2019-05-25 DIAGNOSIS — N390 Urinary tract infection, site not specified: Secondary | ICD-10-CM | POA: Diagnosis not present

## 2019-05-25 DIAGNOSIS — R7881 Bacteremia: Secondary | ICD-10-CM | POA: Diagnosis not present

## 2019-05-26 ENCOUNTER — Telehealth: Payer: Self-pay | Admitting: Cardiovascular Disease

## 2019-05-26 DIAGNOSIS — N184 Chronic kidney disease, stage 4 (severe): Secondary | ICD-10-CM | POA: Diagnosis not present

## 2019-05-26 DIAGNOSIS — D631 Anemia in chronic kidney disease: Secondary | ICD-10-CM | POA: Diagnosis not present

## 2019-05-26 DIAGNOSIS — I5033 Acute on chronic diastolic (congestive) heart failure: Secondary | ICD-10-CM | POA: Diagnosis not present

## 2019-05-26 DIAGNOSIS — I130 Hypertensive heart and chronic kidney disease with heart failure and stage 1 through stage 4 chronic kidney disease, or unspecified chronic kidney disease: Secondary | ICD-10-CM | POA: Diagnosis not present

## 2019-05-26 DIAGNOSIS — N390 Urinary tract infection, site not specified: Secondary | ICD-10-CM | POA: Diagnosis not present

## 2019-05-26 DIAGNOSIS — Z452 Encounter for adjustment and management of vascular access device: Secondary | ICD-10-CM | POA: Diagnosis not present

## 2019-05-26 DIAGNOSIS — R7881 Bacteremia: Secondary | ICD-10-CM | POA: Diagnosis not present

## 2019-05-26 DIAGNOSIS — B952 Enterococcus as the cause of diseases classified elsewhere: Secondary | ICD-10-CM | POA: Diagnosis not present

## 2019-05-26 DIAGNOSIS — I350 Nonrheumatic aortic (valve) stenosis: Secondary | ICD-10-CM | POA: Diagnosis not present

## 2019-05-26 DIAGNOSIS — I495 Sick sinus syndrome: Secondary | ICD-10-CM | POA: Diagnosis not present

## 2019-05-26 NOTE — Telephone Encounter (Signed)
   Pt c/o Shortness Of Breath: STAT if SOB developed within the last 24 hours or pt is noticeably SOB on the phone  1. Are you currently SOB (can you hear that pt is SOB on the phone)? A little   2. How long have you been experiencing SOB? A week or so   3. Are you SOB when sitting or when up moving around? Intermittently, can occur with or without activity  4. Are you currently experiencing any other symptoms? Slight swelling in his legs but it has mostly gone   Daughter of the pt called. The patient has CHF and had some swelling previously. As a result, the patient's dosage of lasix was increased. He is still taking the 60 mg dose but his daughter wanted to know if it is safe for him to go back down to 40 mg.   The daughter wanted to know what else she would need to do to help with the SOB.

## 2019-05-26 NOTE — Telephone Encounter (Signed)
Pt's daughter, DPR, calling and states PCP office recommended she contact Cardiology as PCP is out of office today.  Pt's daughter states PCP increased pt's Lasix from 40mg  to 60mg  last week.  Pt has lost 8 pounds and she is asking if she should decrease Lasix back to 40mg .  She reports pt continues to have some SOB even on the 60mg .  Pt has been sleeping in his recliner since being discharged from hospital 05/06/2019.  States pt is receiving home health services that will convert to hospice care on 06/07/2019.  Pt's O2 sat has been 95-97,with no cough, fever or CP. She reports his B/P and HR have been "excellent" for him. Pt's daughter advised to continue Lasix 60mg  unless directed to change dose as she is not sure if he is to continue on that dose long term.  Pt has PCP appointment in a "couple of weeks" per daughter.  Advised if pt develops severe SOB or CP to go to ED for further evaluation.  Will forward information to Dr Angelena Form and RN for review.  Pt's daughter verbalizes understanding and agrees with current plan.

## 2019-05-27 DIAGNOSIS — Z452 Encounter for adjustment and management of vascular access device: Secondary | ICD-10-CM | POA: Diagnosis not present

## 2019-05-27 DIAGNOSIS — I130 Hypertensive heart and chronic kidney disease with heart failure and stage 1 through stage 4 chronic kidney disease, or unspecified chronic kidney disease: Secondary | ICD-10-CM | POA: Diagnosis not present

## 2019-05-27 DIAGNOSIS — N390 Urinary tract infection, site not specified: Secondary | ICD-10-CM | POA: Diagnosis not present

## 2019-05-27 DIAGNOSIS — I350 Nonrheumatic aortic (valve) stenosis: Secondary | ICD-10-CM | POA: Diagnosis not present

## 2019-05-27 DIAGNOSIS — B952 Enterococcus as the cause of diseases classified elsewhere: Secondary | ICD-10-CM | POA: Diagnosis not present

## 2019-05-27 DIAGNOSIS — I5033 Acute on chronic diastolic (congestive) heart failure: Secondary | ICD-10-CM | POA: Diagnosis not present

## 2019-05-27 DIAGNOSIS — N184 Chronic kidney disease, stage 4 (severe): Secondary | ICD-10-CM | POA: Diagnosis not present

## 2019-05-27 DIAGNOSIS — D631 Anemia in chronic kidney disease: Secondary | ICD-10-CM | POA: Diagnosis not present

## 2019-05-27 DIAGNOSIS — R7881 Bacteremia: Secondary | ICD-10-CM | POA: Diagnosis not present

## 2019-05-27 DIAGNOSIS — I495 Sick sinus syndrome: Secondary | ICD-10-CM | POA: Diagnosis not present

## 2019-05-27 NOTE — Telephone Encounter (Signed)
Agree. Thanks

## 2019-06-01 DIAGNOSIS — D631 Anemia in chronic kidney disease: Secondary | ICD-10-CM | POA: Diagnosis not present

## 2019-06-01 DIAGNOSIS — R7881 Bacteremia: Secondary | ICD-10-CM | POA: Diagnosis not present

## 2019-06-01 DIAGNOSIS — N390 Urinary tract infection, site not specified: Secondary | ICD-10-CM | POA: Diagnosis not present

## 2019-06-01 DIAGNOSIS — I130 Hypertensive heart and chronic kidney disease with heart failure and stage 1 through stage 4 chronic kidney disease, or unspecified chronic kidney disease: Secondary | ICD-10-CM | POA: Diagnosis not present

## 2019-06-01 DIAGNOSIS — Z452 Encounter for adjustment and management of vascular access device: Secondary | ICD-10-CM | POA: Diagnosis not present

## 2019-06-01 DIAGNOSIS — I5033 Acute on chronic diastolic (congestive) heart failure: Secondary | ICD-10-CM | POA: Diagnosis not present

## 2019-06-01 DIAGNOSIS — I495 Sick sinus syndrome: Secondary | ICD-10-CM | POA: Diagnosis not present

## 2019-06-01 DIAGNOSIS — N184 Chronic kidney disease, stage 4 (severe): Secondary | ICD-10-CM | POA: Diagnosis not present

## 2019-06-01 DIAGNOSIS — B952 Enterococcus as the cause of diseases classified elsewhere: Secondary | ICD-10-CM | POA: Diagnosis not present

## 2019-06-01 DIAGNOSIS — I350 Nonrheumatic aortic (valve) stenosis: Secondary | ICD-10-CM | POA: Diagnosis not present

## 2019-06-02 DIAGNOSIS — B952 Enterococcus as the cause of diseases classified elsewhere: Secondary | ICD-10-CM | POA: Diagnosis not present

## 2019-06-02 DIAGNOSIS — I130 Hypertensive heart and chronic kidney disease with heart failure and stage 1 through stage 4 chronic kidney disease, or unspecified chronic kidney disease: Secondary | ICD-10-CM | POA: Diagnosis not present

## 2019-06-02 DIAGNOSIS — R7881 Bacteremia: Secondary | ICD-10-CM | POA: Diagnosis not present

## 2019-06-02 DIAGNOSIS — N390 Urinary tract infection, site not specified: Secondary | ICD-10-CM | POA: Diagnosis not present

## 2019-06-02 DIAGNOSIS — N184 Chronic kidney disease, stage 4 (severe): Secondary | ICD-10-CM | POA: Diagnosis not present

## 2019-06-02 DIAGNOSIS — I5033 Acute on chronic diastolic (congestive) heart failure: Secondary | ICD-10-CM | POA: Diagnosis not present

## 2019-06-02 DIAGNOSIS — I350 Nonrheumatic aortic (valve) stenosis: Secondary | ICD-10-CM | POA: Diagnosis not present

## 2019-06-02 DIAGNOSIS — I495 Sick sinus syndrome: Secondary | ICD-10-CM | POA: Diagnosis not present

## 2019-06-02 DIAGNOSIS — Z452 Encounter for adjustment and management of vascular access device: Secondary | ICD-10-CM | POA: Diagnosis not present

## 2019-06-02 DIAGNOSIS — D631 Anemia in chronic kidney disease: Secondary | ICD-10-CM | POA: Diagnosis not present

## 2019-06-07 ENCOUNTER — Other Ambulatory Visit (HOSPITAL_COMMUNITY): Payer: Self-pay | Admitting: *Deleted

## 2019-06-07 DIAGNOSIS — I350 Nonrheumatic aortic (valve) stenosis: Secondary | ICD-10-CM | POA: Diagnosis not present

## 2019-06-07 DIAGNOSIS — I130 Hypertensive heart and chronic kidney disease with heart failure and stage 1 through stage 4 chronic kidney disease, or unspecified chronic kidney disease: Secondary | ICD-10-CM | POA: Diagnosis not present

## 2019-06-07 DIAGNOSIS — D631 Anemia in chronic kidney disease: Secondary | ICD-10-CM | POA: Diagnosis not present

## 2019-06-07 DIAGNOSIS — K8020 Calculus of gallbladder without cholecystitis without obstruction: Secondary | ICD-10-CM | POA: Diagnosis not present

## 2019-06-07 DIAGNOSIS — N200 Calculus of kidney: Secondary | ICD-10-CM | POA: Diagnosis not present

## 2019-06-07 DIAGNOSIS — C679 Malignant neoplasm of bladder, unspecified: Secondary | ICD-10-CM | POA: Diagnosis not present

## 2019-06-07 DIAGNOSIS — I5033 Acute on chronic diastolic (congestive) heart failure: Secondary | ICD-10-CM | POA: Diagnosis not present

## 2019-06-07 DIAGNOSIS — K259 Gastric ulcer, unspecified as acute or chronic, without hemorrhage or perforation: Secondary | ICD-10-CM | POA: Diagnosis not present

## 2019-06-07 DIAGNOSIS — N184 Chronic kidney disease, stage 4 (severe): Secondary | ICD-10-CM | POA: Diagnosis not present

## 2019-06-07 DIAGNOSIS — N139 Obstructive and reflux uropathy, unspecified: Secondary | ICD-10-CM | POA: Diagnosis not present

## 2019-06-08 ENCOUNTER — Other Ambulatory Visit: Payer: Self-pay

## 2019-06-08 ENCOUNTER — Ambulatory Visit (HOSPITAL_COMMUNITY)
Admission: RE | Admit: 2019-06-08 | Discharge: 2019-06-08 | Disposition: A | Discharge status: Home / Self Care | Source: Ambulatory Visit | Attending: Internal Medicine | Admitting: Internal Medicine

## 2019-06-08 DIAGNOSIS — M810 Age-related osteoporosis without current pathological fracture: Secondary | ICD-10-CM | POA: Insufficient documentation

## 2019-06-08 MED ORDER — DENOSUMAB 60 MG/ML ~~LOC~~ SOSY
PREFILLED_SYRINGE | SUBCUTANEOUS | Status: AC
  Administered 2019-06-08: 60 mg via SUBCUTANEOUS
  Filled 2019-06-08: qty 1

## 2019-06-08 MED ORDER — DENOSUMAB 60 MG/ML ~~LOC~~ SOSY: 60.0000 mg | PREFILLED_SYRINGE | Freq: Once | SUBCUTANEOUS | Status: AC

## 2019-06-14 NOTE — Telephone Encounter (Signed)
Please see patient advice message and advise if you need any follow up. Landis Gandy, RN

## 2019-06-17 ENCOUNTER — Other Ambulatory Visit: Payer: Self-pay

## 2019-06-17 ENCOUNTER — Encounter (HOSPITAL_COMMUNITY): Payer: Self-pay | Admitting: Emergency Medicine

## 2019-06-17 ENCOUNTER — Emergency Department (HOSPITAL_COMMUNITY)
Admission: EM | Admit: 2019-06-17 | Discharge: 2019-06-17 | Disposition: A | Discharge status: Home / Self Care | Attending: Emergency Medicine | Admitting: Emergency Medicine

## 2019-06-17 ENCOUNTER — Emergency Department (HOSPITAL_COMMUNITY)

## 2019-06-17 DIAGNOSIS — I5033 Acute on chronic diastolic (congestive) heart failure: Secondary | ICD-10-CM | POA: Insufficient documentation

## 2019-06-17 DIAGNOSIS — I130 Hypertensive heart and chronic kidney disease with heart failure and stage 1 through stage 4 chronic kidney disease, or unspecified chronic kidney disease: Secondary | ICD-10-CM | POA: Diagnosis not present

## 2019-06-17 DIAGNOSIS — Z9889 Other specified postprocedural states: Secondary | ICD-10-CM | POA: Insufficient documentation

## 2019-06-17 DIAGNOSIS — R1032 Left lower quadrant pain: Secondary | ICD-10-CM | POA: Insufficient documentation

## 2019-06-17 DIAGNOSIS — Z79899 Other long term (current) drug therapy: Secondary | ICD-10-CM | POA: Insufficient documentation

## 2019-06-17 DIAGNOSIS — Z87891 Personal history of nicotine dependence: Secondary | ICD-10-CM | POA: Diagnosis not present

## 2019-06-17 DIAGNOSIS — N184 Chronic kidney disease, stage 4 (severe): Secondary | ICD-10-CM | POA: Diagnosis not present

## 2019-06-17 DIAGNOSIS — R109 Unspecified abdominal pain: Secondary | ICD-10-CM | POA: Diagnosis present

## 2019-06-17 DIAGNOSIS — K8020 Calculus of gallbladder without cholecystitis without obstruction: Secondary | ICD-10-CM | POA: Diagnosis not present

## 2019-06-17 DIAGNOSIS — N200 Calculus of kidney: Secondary | ICD-10-CM | POA: Diagnosis not present

## 2019-06-17 DIAGNOSIS — Z7982 Long term (current) use of aspirin: Secondary | ICD-10-CM | POA: Insufficient documentation

## 2019-06-17 LAB — URINALYSIS, ROUTINE W REFLEX MICROSCOPIC
Bilirubin Urine: NEGATIVE
Glucose, UA: NEGATIVE mg/dL
Hgb urine dipstick: NEGATIVE
Ketones, ur: NEGATIVE mg/dL
Nitrite: NEGATIVE
Protein, ur: NEGATIVE mg/dL
Specific Gravity, Urine: 1.011 (ref 1.005–1.030)
pH: 6 (ref 5.0–8.0)

## 2019-06-17 LAB — HEPATIC FUNCTION PANEL
ALT: 21 U/L (ref 0–44)
AST: 30 U/L (ref 15–41)
Albumin: 3.2 g/dL — ABNORMAL LOW (ref 3.5–5.0)
Alkaline Phosphatase: 62 U/L (ref 38–126)
Bilirubin, Direct: 0.1 mg/dL (ref 0.0–0.2)
Indirect Bilirubin: 0.2 mg/dL — ABNORMAL LOW (ref 0.3–0.9)
Total Bilirubin: 0.3 mg/dL (ref 0.3–1.2)
Total Protein: 7.2 g/dL (ref 6.5–8.1)

## 2019-06-17 LAB — CBC
HCT: 38.3 % — ABNORMAL LOW (ref 39.0–52.0)
Hemoglobin: 11.8 g/dL — ABNORMAL LOW (ref 13.0–17.0)
MCH: 31.8 pg (ref 26.0–34.0)
MCHC: 30.8 g/dL (ref 30.0–36.0)
MCV: 103.2 fL — ABNORMAL HIGH (ref 80.0–100.0)
Platelets: 206 10*3/uL (ref 150–400)
RBC: 3.71 MIL/uL — ABNORMAL LOW (ref 4.22–5.81)
RDW: 14.1 % (ref 11.5–15.5)
WBC: 10.4 10*3/uL (ref 4.0–10.5)
nRBC: 0 % (ref 0.0–0.2)

## 2019-06-17 LAB — COMPREHENSIVE METABOLIC PANEL
ALT: 20 U/L (ref 0–44)
AST: 28 U/L (ref 15–41)
Albumin: 3.2 g/dL — ABNORMAL LOW (ref 3.5–5.0)
Alkaline Phosphatase: 66 U/L (ref 38–126)
Anion gap: 10 (ref 5–15)
BUN: 37 mg/dL — ABNORMAL HIGH (ref 8–23)
CO2: 23 mmol/L (ref 22–32)
Calcium: 8.2 mg/dL — ABNORMAL LOW (ref 8.9–10.3)
Chloride: 107 mmol/L (ref 98–111)
Creatinine, Ser: 1.83 mg/dL — ABNORMAL HIGH (ref 0.61–1.24)
GFR calc Af Amer: 36 mL/min — ABNORMAL LOW (ref >60)
GFR calc non Af Amer: 31 mL/min — ABNORMAL LOW (ref >60)
Glucose, Bld: 130 mg/dL — ABNORMAL HIGH (ref 70–99)
Potassium: 4.9 mmol/L (ref 3.5–5.1)
Sodium: 140 mmol/L (ref 135–145)
Total Bilirubin: 0.7 mg/dL (ref 0.3–1.2)
Total Protein: 7.3 g/dL (ref 6.5–8.1)

## 2019-06-17 LAB — LIPASE, BLOOD: Lipase: 33 U/L (ref 11–51)

## 2019-06-17 MED ORDER — ONDANSETRON HCL 4 MG/2ML IJ SOLN: 4.0000 mg | Freq: Once | INTRAMUSCULAR | Status: DC | PRN

## 2019-06-17 MED ORDER — CEPHALEXIN 250 MG/5ML PO SUSR: 250.0000 mg | Freq: Every day | ORAL | 0 refills | Status: AC

## 2019-06-17 MED ORDER — SODIUM CHLORIDE 0.9% FLUSH
3.0000 mL | Freq: Once | INTRAVENOUS | Status: AC
  Administered 2019-06-17: 3 mL via INTRAVENOUS

## 2019-06-17 MED ORDER — ONDANSETRON 4 MG PO TBDP: 2.0000 mg | ORAL_TABLET | Freq: Three times a day (TID) | ORAL | 0 refills | Status: DC | PRN

## 2019-06-17 MED ORDER — FENTANYL CITRATE (PF) 100 MCG/2ML IJ SOLN
50.0000 ug | Freq: Once | INTRAMUSCULAR | Status: AC
  Administered 2019-06-17: 50 ug via INTRAVENOUS
  Filled 2019-06-17: qty 2

## 2019-06-17 NOTE — ED Provider Notes (Signed)
[place] DEPT Provider Note   CSN: [id number] Arrival date & time: 06/17/19  0535     History Chief Complaint  Patient presents with  . Abdominal Pain    [name] [name] is a 86 y.o. male.  HPI 86 year old male presents with left-sided abdominal pain.  Left-sided and seems to go to his left lower abdomen.  Started around 11 PM.  Daughter feels like is kind of like when he had gallbladder problems a month or so ago.  Chronic diarrhea that is unchanged.  Felt nauseated.  No fevers. Has noticed some blood out of nephrostomy urine.   Past Medical History:  Diagnosis Date  . Aortic valve stenosis    a. mild to moderate by echo 2013  . Bradycardia    MDT dual chamber pacemaker  . Cancer [facility])    bladder cancer  . Chronic kidney disease   . Deep venous thrombosis (HCC)    hx of  LEFT ARM AFTER PACEMAKER INSERTION ABOUT 6 YRS AGO  . Depression   . Gout   . Hypertension   . Kidney stone   . Macular degeneration    PT STATES HIS VISION STILL OKAY FOR DRIVING  . Osteoporosis   . Prostate cancer ([place])    AND BLADDER CANCER - S/P URETEROILEAL CONDUIT  . Renal disorder   . S/P ileal conduit [facility])         Patient Active Problem List   Diagnosis Date Noted  . Enterococcal bacteremia 04/27/2019  . Choledocholithiasis 04/27/2019  . Status post ileal conduit ([place]) 04/27/2019  . Macrocytic anemia 04/27/2019  . HOH (hard of hearing) 04/27/2019  . History of prostate cancer 04/27/2019  . Abnormal liver function 04/25/2019  . Acute on chronic diastolic CHF (congestive heart failure) ([place]) 12/09/2018  . Legally blind 06/13/2017  . Bladder cancer ([place]) 06/13/2017  . Sepsis ([place]) 02/04/2014  . Acute lower UTI 07/30/2013  . CKD (chronic kidney disease), stage IV ([place]) 07/29/2013  . Hydronephrosis, right 07/15/2013  . Kidney stone 08/20/2011  . Obstructive uropathy 08/16/2011  . Gout 08/16/2011  . Aortic stenosis 08/16/2011  . Hypertension  10/02/2010  . Pacemaker 10/02/2010  . Sinus bradycardia 10/02/2010    Past Surgical History:  Procedure Laterality Date  . 07/20/13  CONVERSION OF RIGHT SIDED NEPHROSTOMY CATHETER TO RIGHT SIDED NEPHRO URETERAL CATHETER - DONE IN INTERVENTIONAL RADIOLOGY    . APPENDECTOMY    . BALLOON DILATION N/A 04/27/2019   Procedure: BALLOON DILATION;  Surgeon: Clarene Essex, MD;  Location: [facility] ENDOSCOPY;  Service: Endoscopy;  Laterality: N/A;  pyloric   . CATARACT EXTRACTION, BILATERAL    . CYSTECTOMY    . ERCP N/A 04/27/2019   Procedure: ENDOSCOPIC RETROGRADE CHOLANGIOPANCREATOGRAPHY (ERCP);  Surgeon: Clarene Essex, MD;  Location: [facility];  Service: Endoscopy;  Laterality: N/A;  . ESOPHAGOGASTRODUODENOSCOPY N/A 04/27/2019   Procedure: ESOPHAGOGASTRODUODENOSCOPY (EGD);  Surgeon: Clarene Essex, MD;  Location: [facility];  Service: Endoscopy;  Laterality: N/A;  . ilial conduit     for bladder cancer  . IR GENERIC HISTORICAL  10/26/2015   IR NEPHROSTOMY TUBE CHANGE 10/26/2015 Jacqulynn Cadet, MD [facility]-INTERV RAD  . IR GENERIC HISTORICAL  12/07/2015   IR NEPHROSTOMY TUBE CHANGE 12/07/2015 Greggory Keen, MD [facility]  . IR GENERIC HISTORICAL  01/11/2016   IR NEPHROSTOMY TUBE CHANGE 01/11/2016 Greggory Keen, MD [facility]  . IR GENERIC HISTORICAL  02/08/2016   IR NEPHROSTOMY TUBE CHANGE 02/08/2016 [facility]  . IR GENERIC HISTORICAL  03/14/2016   IR NEPHROSTOMY TUBE CHANGE 03/14/2016 Jacqulynn Cadet, MD [facility]  . IR GENERIC HISTORICAL  04/22/2016   IR NEPHROSTOMY TUBE CHANGE 04/22/2016 Aletta Edouard, MD [facility]  . IR GENERIC HISTORICAL  05/27/2016   IR NEPHROSTOMY TUBE CHANGE 05/27/2016 Arne Cleveland, MD [facility]  . IR NEPHROSTOMY EXCHANGE RIGHT  07/25/2017  . IR NEPHROSTOMY EXCHANGE RIGHT  10/13/2017  . IR NEPHROSTOMY EXCHANGE RIGHT  11/18/2017  . IR NEPHROSTOMY EXCHANGE RIGHT  12/30/2017  . IR NEPHROSTOMY EXCHANGE RIGHT  02/10/2018  . IR NEPHROSTOMY EXCHANGE RIGHT  03/23/2018  . IR  NEPHROSTOMY EXCHANGE RIGHT  05/04/2018  . IR NEPHROSTOMY EXCHANGE RIGHT  06/25/2018  . IR NEPHROSTOMY EXCHANGE RIGHT  08/13/2018  . IR NEPHROSTOMY EXCHANGE RIGHT  10/01/2018  . IR NEPHROSTOMY EXCHANGE RIGHT  11/19/2018  . IR NEPHROSTOMY EXCHANGE RIGHT  01/05/2019  . IR NEPHROSTOMY EXCHANGE RIGHT  04/01/2019  . IR NEPHROSTOMY EXCHANGE RIGHT  05/18/2019  . IR NEPHROSTOMY PLACEMENT RIGHT  06/13/2017  . IR NEPHROSTOMY PLACEMENT RIGHT  02/23/2019  . IR NEPHROSTOMY TUBE CHANGE  07/01/2016  . IR NEPHROSTOMY TUBE CHANGE  08/05/2016  . IR NEPHROSTOMY TUBE CHANGE  09/18/2016  . IR NEPHROSTOMY TUBE CHANGE  10/24/2016  . IR NEPHROSTOMY TUBE CHANGE  11/18/2016  . IR NEPHROSTOMY TUBE CHANGE  12/30/2016  . IR NEPHROSTOMY TUBE CHANGE  02/03/2017  . IR NEPHROSTOMY TUBE CHANGE  03/13/2017  . IR NEPHROSTOMY TUBE CHANGE  04/17/2017  . IR NEPHROSTOMY TUBE CHANGE  05/28/2017  . IR NEPHROSTOMY TUBE CHANGE  09/04/2017  . KIDNEY STONE SURGERY    . LYMPHADENECTOMY    . NEPHROLITHOTOMY  09/02/2011   Procedure: NEPHROLITHOTOMY PERCUTANEOUS;  Surgeon: Malka So, MD;  Location: [facility] ORS;  Service: Urology;  Laterality: Left;  . NEPHROLITHOTOMY Right 07/27/2013   Procedure: RIGHT PERCUTANEOUS NEPHROLITHOTOMY ;  Surgeon: Irine Seal, MD;  Location: [facility];  Service: Urology;  Laterality: Right;  . PACEMAKER INSERTION  2009   MDT dual chamber pacemaker implanted by Dr Verlon Setting  . PROSTATE SURGERY    . REMOVAL OF STONES  04/27/2019   Procedure: REMOVAL OF STONES;  Surgeon: Clarene Essex, MD;  Location: [facility];  Service: Endoscopy;;  . Joan Mayans  04/27/2019   Procedure: Joan Mayans;  Surgeon: Clarene Essex, MD;  Location: [facility];  Service: Endoscopy;;       Family History  Problem Relation Age of Onset  . Pancreatic cancer Father 61       died  . Pancreatic cancer Mother 60       died    Social History   Tobacco Use  . Smoking status: Former Smoker    Types: Cigarettes    Quit date: 03/25/1952    Years since quitting:  67.2  . Smokeless tobacco: Never Used  Substance Use Topics  . Alcohol use: No  . Drug use: No    Home Medications Prior to Admission medications   Medication Sig Start Date End Date Taking? Authorizing Provider  acetaminophen (TYLENOL) 500 MG tablet Take 1,000 mg by mouth every 8 (eight) hours as needed for mild pain or headache.    [provider]  aspirin EC 81 MG tablet Take 81 mg by mouth every morning.    [provider]  Besifloxacin HCl (BESIVANCE) 0.6 % SUSP Apply 1 drop to eye See admin instructions. Only uses before eye shot he receives every 10 weeks.    [provider]  cephALEXin (KEFLEX) 250 MG/5ML suspension Take  5 mLs (250 mg total) by mouth daily for 7 days. 06/17/19 06/24/19  Sherwood Gambler, MD  Cholecalciferol (VITAMIN D-3) 1000 UNITS CAPS Take 1 capsule by mouth every morning.    [provider]  colchicine 0.6 MG tablet Take 0.6 mg by mouth as needed (gout).     [provider]  docusate sodium (COLACE) 100 MG capsule Take 300 mg by mouth daily.     [provider]  furosemide (LASIX) 20 MG tablet Take 1 tablet (20 mg total) by mouth daily. 12/16/18 12/11/19  Burnell Blanks, MD  Hyprom-Naphaz-Polysorb-Zn Sulf (CLEAR EYES COMPLETE OP) Apply 1-2 drops to eye daily as needed (dry eyes).    [provider]  levothyroxine (SYNTHROID) 25 MCG tablet Take 25 mcg by mouth every morning. 04/07/19   [provider]  metoprolol succinate (TOPROL-XL) 12.5 mg TB24 24 hr tablet Take 12.5 mg by mouth daily.    [provider]  Multiple Vitamins-Minerals (VITEYES AREDS ADVANCED PO) Take 2 capsules by mouth daily.    [provider]  OMEGA-3 KRILL OIL PO Take 1 tablet by mouth every morning.    [provider]  ondansetron (ZOFRAN ODT) 4 MG disintegrating tablet Take 0.5 tablets (2 mg total) by mouth every 8 (eight) hours as needed for nausea or vomiting. 06/17/19   Sherwood Gambler, MD   pantoprazole (PROTONIX) 40 MG tablet Take 1 tablet (40 mg total) by mouth daily. 05/05/19 06/04/19  Donne Hazel, MD  polyethylene glycol powder (GLYCOLAX/MIRALAX) 17 GM/SCOOP powder Take 17 g by mouth daily as needed for mild constipation. Takes 17grams (1 capful) as needed. 12/12/18   Mariel Aloe, MD  ULORIC 40 MG tablet Take 1 tablet by mouth daily. 11/24/15   [provider]    Allergies    Patient has no known allergies.  Review of Systems   Review of Systems  Constitutional: Negative for fever.  Gastrointestinal: Positive for abdominal pain and nausea. Negative for vomiting.  All other systems reviewed and are negative.   Physical Exam Updated Vital Signs BP (!) 186/51   Pulse 63   Temp 97.6 F (36.4 C) (Oral)   Resp (!) 29   Ht 5\' 7"  (1.702 m)   Wt 66.7 kg   SpO2 95%   BMI 23.02 kg/m   Physical Exam Vitals and nursing note reviewed.  Constitutional:      General: He is not in acute distress.    Appearance: He is well-developed. He is not ill-appearing or diaphoretic.  HENT:     Head: Normocephalic and atraumatic.     Right Ear: External ear normal.     Left Ear: External ear normal.     Nose: Nose normal.  Eyes:     General:        Right eye: No discharge.        Left eye: No discharge.  Cardiovascular:     Rate and Rhythm: Normal rate. Rhythm irregular.     Heart sounds: Murmur present.  Pulmonary:     Effort: Pulmonary effort is normal.     Breath sounds: Normal breath sounds.  Abdominal:     Palpations: Abdomen is soft.     Tenderness: There is abdominal tenderness in the left lower quadrant.  Musculoskeletal:     Cervical back: Neck supple.  Skin:    General: Skin is warm and dry.  Neurological:     Mental Status: He is alert.  Psychiatric:  Mood and Affect: Mood is not anxious.     ED Results / Procedures / Treatments   Labs (all labs ordered are listed, but only abnormal results are displayed) Labs Reviewed  CBC -  Abnormal; Notable for the following components:      Result Value   RBC 3.71 (*)    Hemoglobin 11.8 (*)    HCT 38.3 (*)    MCV 103.2 (*)    All other components within normal limits  URINALYSIS, ROUTINE W REFLEX MICROSCOPIC - Abnormal; Notable for the following components:   Leukocytes,Ua LARGE (*)    Bacteria, UA RARE (*)    All other components within normal limits  HEPATIC FUNCTION PANEL - Abnormal; Notable for the following components:   Albumin 3.2 (*)    Indirect Bilirubin 0.2 (*)    All other components within normal limits  COMPREHENSIVE METABOLIC PANEL - Abnormal; Notable for the following components:   Glucose, Bld 130 (*)    BUN 37 (*)    Creatinine, Ser 1.83 (*)    Calcium 8.2 (*)    Albumin 3.2 (*)    GFR calc non Af Amer 31 (*)    GFR calc Af Amer 36 (*)    All other components within normal limits  URINE CULTURE  LIPASE, BLOOD    EKG None  Radiology CT ABDOMEN PELVIS WO CONTRAST  Result Date: 06/17/2019 CLINICAL DATA:  Abdominal pain. Recent kidney stone removal. History of bladder and prostate surgery. EXAM: CT ABDOMEN AND PELVIS WITHOUT CONTRAST TECHNIQUE: Multidetector CT imaging of the abdomen and pelvis was performed following the standard protocol without IV contrast. COMPARISON:  04/25/2019 FINDINGS: Lower chest: Moderate right and trace left pleural effusions. There is dependent opacity in the right lower lobe, minimally on the left, consistent with atelectasis. Small to moderate pericardial effusion. These findings are stable from the prior CT. Hepatobiliary: Liver normal in size and attenuation. No masses. Multiple dependent gallstones. No convincing gallbladder wall thickening. There is intra and extrahepatic biliary air. Patient has had a sphincterotomy since the prior CT. Pancreas: Unremarkable. No pancreatic ductal dilatation or surrounding inflammatory changes. Spleen: Normal in size without focal abnormality. Adrenals/Urinary Tract: No adrenal masses.  Right nephrostomy tube is well positioned. No intrarenal collecting system dilation. Right renal cortical thinning with overall right renal atrophy. The right ureter is mildly dilated. It connects to a right lower quadrant ileal conduit. Stable nonobstructing stone lower pole of the left kidney. Low-density mass in the medial midpole consistent with a cyst, also stable. Stranding is noted along the lateral margin of the left kidney consistent with scarring, also unchanged. No left hydronephrosis. Left ureter is normal in caliber, also connecting to the right lower quadrant ileal conduit. Stomach/Bowel: Normal stomach. Small bowel and colon are normal in caliber. No wall thickening. No inflammation. Bowel anastomosis staples noted in the right lower quadrant. Ileo conduit extends to the right mid to lower abdomen anterior abdominal wall. Stool and air mild to moderately distends the rectum. Vascular/Lymphatic: Aortic atherosclerosis. No aneurysm. No enlarged lymph nodes. Reproductive: Unremarkable. Other: Small low anterior abdominal wall hernia containing a knuckle of small bowel without obstruction or evidence strangulation. This is stable from the prior CT. No ascites. Musculoskeletal: No fracture or acute finding. No osteoblastic or osteolytic lesions. IMPRESSION: 1. No acute findings within the abdomen or pelvis. 2. Distended gallbladder with gallstones, without convincing evidence of acute cholecystitis. 3. Well-positioned right nephrostomy tube, stable from the prior exam. No hydronephrosis. Stable left lower  pole intrarenal stone. No left hydronephrosis. 4. No bowel obstruction or inflammation. 5. Stable aortic atherosclerosis. 6. Moderate right and trace left pleural effusions associated with dependent atelectasis. Small to moderate pericardial effusion. Lung base findings are stable from the prior CT. Electronically Signed   By: Lajean Manes M.D.   On: 06/17/2019 08:35    Procedures Procedures  (including critical care time)  Medications Ordered in ED Medications  ondansetron (ZOFRAN) injection 4 mg (has no administration in time range)  sodium chloride flush (NS) 0.9 % injection 3 mL (3 mLs Intravenous Given 06/17/19 0659)  fentaNYL (SUBLIMAZE) injection 50 mcg (50 mcg Intravenous Given 06/17/19 2241)    ED Course  I have reviewed the triage vital signs and the nursing notes.  Pertinent labs & imaging results that were available during my care of the patient were reviewed by me and considered in my medical decision making (see chart for details).    MDM Rules/Calculators/A&P                      Patient is feeling better.  Labs are near baseline and LFTs are okay today.  Pain seems more left-sided and while his gallbladder is abnormal, this does not appear to be cholecystitis.  CT is stable.  WBC is top normal.  Urine is dirty with leukocytes but comes from his urostomy so is hard to tell if this is infected or not.  Given some nausea and diffuse abdominal pain per report, I think is reasonable to treat as possible UTI and discussed this with daughter who agrees.  Will give Keflex based on his creatinine clearance.  Discharged home with return precautions. Final Clinical Impression(s) / ED Diagnoses Final diagnoses:  Abdominal pain, unspecified abdominal location    Rx / DC Orders ED Discharge Orders         Ordered    cephALEXin (KEFLEX) 250 MG/5ML suspension  Daily     06/17/19 0924    ondansetron (ZOFRAN ODT) 4 MG disintegrating tablet  Every 8 hours PRN     06/17/19 0926           Sherwood Gambler, MD 06/17/19 [phone]

## 2019-06-17 NOTE — ED Triage Notes (Signed)
Patient is complaining of abdominal pain. Patient recently had some kidney stones removed.

## 2019-06-17 NOTE — ED Notes (Signed)
Family at bedside. 

## 2019-06-17 NOTE — Discharge Instructions (Signed)
If you develop worsening, continued, or recurrent abdominal pain, uncontrolled vomiting, fever, chest or back pain, or any other new/concerning symptoms then return to the ER for evaluation.  

## 2019-06-17 NOTE — ED Notes (Signed)
Discharge paperwork and prescriptions reviewed with pt and family member.  Pt verbalized understanding, assisted to dress by family member.  Pt wheeled to ED entrance at discharge.

## 2019-06-20 LAB — URINE CULTURE: Culture: >=100000 — AB

## 2019-06-21 ENCOUNTER — Telehealth: Payer: Self-pay | Admitting: Emergency Medicine

## 2019-06-21 NOTE — Telephone Encounter (Signed)
Post ED Visit - Positive Culture Follow-up: Successful Patient Follow-Up  Culture assessed and recommendations reviewed by:  []  Elenor Quinones, Pharm.D. []  Heide Guile, Pharm.D., BCPS AQ-ID []  Parks Neptune, Pharm.D., BCPS []  Alycia Rossetti, Pharm.D., BCPS []  [place], Pharm.D., BCPS, AAHIVP []  Legrand Como, Pharm.D., BCPS, AAHIVP []  Salome Arnt, PharmD, BCPS []  Johnnette Gourd, PharmD, BCPS []  Hughes Better, PharmD, BCPS []  Leeroy Cha, PharmD  Positive urine culture  []  Patient discharged without antimicrobial prescription and treatment is now indicated [x]  Organism is resistant to prescribed ED discharge antimicrobial []  Patient with positive blood cultures  Changes discussed with ED provider: Christ Kick MD  New antibiotic prescription stop keflex, resistant to bacteria found on culture.  Symptom check, if + urinary symptoms tell patient to come to ED for treatment, if no symptoms tell patient to followup with urologist or PCP for repeat urine culture  Attempting to contact daughter    Hazle Nordmann 06/21/2019, 12:18 PM

## 2019-06-21 NOTE — Progress Notes (Addendum)
ED Antimicrobial Stewardship Positive Culture Follow Up   [name] [name] is an 86 y.o. male who presented to [facility] on 06/17/2019 with a chief complaint of  Chief Complaint  Patient presents with  . Abdominal Pain    Recent Results (from the past 720 hour(s))  Urine culture     Status: Abnormal   Collection Time: 06/17/19  6:11 AM   Specimen: Urine, Clean Catch  Result Value Ref Range Status   Specimen Description   Final    URINE, CLEAN CATCH Performed at [facility], [place] [address], [place], [place] [address]    Special Requests   Final    NONE Performed at [facility], [place] [address], [place], [place] [address]    Culture (A)  Final    >=100,000 COLONIES/mL PSEUDOMONAS AERUGINOSA >=100,000 COLONIES/mL VANCOMYCIN RESISTANT ENTEROCOCCUS CORRECTED ON 03/28 AT 4970: PREVIOUSLY REPORTED AS >=100,000 COLONIES/mL ENTEROCOCCUS FAECIUM    Report Status 06/20/2019 FINAL  Final   Organism ID, Bacteria VANCOMYCIN RESISTANT ENTEROCOCCUS (A)  Final   Organism ID, Bacteria PSEUDOMONAS AERUGINOSA (A)  Final      Susceptibility   Pseudomonas aeruginosa - MIC*    CEFTAZIDIME 16 INTERMEDIATE Intermediate     CIPROFLOXACIN >=4 RESISTANT Resistant     GENTAMICIN <=1 SENSITIVE Sensitive     IMIPENEM 1 SENSITIVE Sensitive     CEFEPIME RESISTANT Resistant     * >=100,000 COLONIES/mL PSEUDOMONAS AERUGINOSA   Vancomycin resistant enterococcus - MIC*    AMPICILLIN >=32 RESISTANT Resistant     NITROFURANTOIN 256 RESISTANT Resistant     VANCOMYCIN >=32 RESISTANT Resistant     LINEZOLID 2 SENSITIVE Sensitive     * >=100,000 COLONIES/mL VANCOMYCIN RESISTANT ENTEROCOCCUS CORRECTED ON 03/28 AT 2637: PREVIOUSLY REPORTED AS >=100,000 COLONIES/mL ENTEROCOCCUS FAECIUM   Urine culture came back positive for Pseudomonas and VRE.   Plan:  - Contact patient to stop Keflex because it does not cover the bacteria present.  - Do a symptom check on the patient. If  he is feeling worse, or is now having urinary symptoms, tell patient to return to the ED.  - if the patient has no symptoms, tell him to follow up with his PCP or urologist (if he has one) to obtain another urine culture.  ED Provider: Christ Kick, MD   Julieta Bellini, 4th Year PharmD Candidate 06/21/2019, 11:18 AM [phone]

## 2019-06-22 ENCOUNTER — Encounter (HOSPITAL_COMMUNITY): Payer: Self-pay

## 2019-06-22 ENCOUNTER — Emergency Department (HOSPITAL_COMMUNITY)
Admission: EM | Admit: 2019-06-22 | Discharge: 2019-06-22 | Disposition: A | Discharge status: Home / Self Care | Attending: Emergency Medicine | Admitting: Emergency Medicine

## 2019-06-22 ENCOUNTER — Other Ambulatory Visit: Payer: Self-pay

## 2019-06-22 DIAGNOSIS — R82998 Other abnormal findings in urine: Secondary | ICD-10-CM | POA: Diagnosis present

## 2019-06-22 DIAGNOSIS — I5032 Chronic diastolic (congestive) heart failure: Secondary | ICD-10-CM | POA: Diagnosis not present

## 2019-06-22 DIAGNOSIS — Z79899 Other long term (current) drug therapy: Secondary | ICD-10-CM | POA: Diagnosis not present

## 2019-06-22 DIAGNOSIS — Z7982 Long term (current) use of aspirin: Secondary | ICD-10-CM | POA: Insufficient documentation

## 2019-06-22 DIAGNOSIS — N184 Chronic kidney disease, stage 4 (severe): Secondary | ICD-10-CM | POA: Insufficient documentation

## 2019-06-22 DIAGNOSIS — I130 Hypertensive heart and chronic kidney disease with heart failure and stage 1 through stage 4 chronic kidney disease, or unspecified chronic kidney disease: Secondary | ICD-10-CM | POA: Insufficient documentation

## 2019-06-22 DIAGNOSIS — Z711 Person with feared health complaint in whom no diagnosis is made: Secondary | ICD-10-CM | POA: Insufficient documentation

## 2019-06-22 DIAGNOSIS — Z87891 Personal history of nicotine dependence: Secondary | ICD-10-CM | POA: Diagnosis not present

## 2019-06-22 LAB — CBC WITH DIFFERENTIAL/PLATELET
Abs Immature Granulocytes: 0.04 10*3/uL (ref 0.00–0.07)
Basophils Absolute: 0 10*3/uL (ref 0.0–0.1)
Basophils Relative: 0 %
Eosinophils Absolute: 0.4 10*3/uL (ref 0.0–0.5)
Eosinophils Relative: 6 %
HCT: 33.5 % — ABNORMAL LOW (ref 39.0–52.0)
Hemoglobin: 10.4 g/dL — ABNORMAL LOW (ref 13.0–17.0)
Immature Granulocytes: 1 %
Lymphocytes Relative: 14 %
Lymphs Abs: 1 10*3/uL (ref 0.7–4.0)
MCH: 32.3 pg (ref 26.0–34.0)
MCHC: 31 g/dL (ref 30.0–36.0)
MCV: 104 fL — ABNORMAL HIGH (ref 80.0–100.0)
Monocytes Absolute: 0.8 10*3/uL (ref 0.1–1.0)
Monocytes Relative: 11 %
Neutro Abs: 5.1 10*3/uL (ref 1.7–7.7)
Neutrophils Relative %: 68 %
Platelets: 187 10*3/uL (ref 150–400)
RBC: 3.22 MIL/uL — ABNORMAL LOW (ref 4.22–5.81)
RDW: 14 % (ref 11.5–15.5)
WBC: 7.4 10*3/uL (ref 4.0–10.5)
nRBC: 0 % (ref 0.0–0.2)

## 2019-06-22 LAB — COMPREHENSIVE METABOLIC PANEL
ALT: 22 U/L (ref 0–44)
AST: 30 U/L (ref 15–41)
Albumin: 2.8 g/dL — ABNORMAL LOW (ref 3.5–5.0)
Alkaline Phosphatase: 69 U/L (ref 38–126)
Anion gap: 8 (ref 5–15)
BUN: 54 mg/dL — ABNORMAL HIGH (ref 8–23)
CO2: 26 mmol/L (ref 22–32)
Calcium: 8.4 mg/dL — ABNORMAL LOW (ref 8.9–10.3)
Chloride: 107 mmol/L (ref 98–111)
Creatinine, Ser: 2.35 mg/dL — ABNORMAL HIGH (ref 0.61–1.24)
GFR calc Af Amer: 26 mL/min — ABNORMAL LOW (ref >60)
GFR calc non Af Amer: 23 mL/min — ABNORMAL LOW (ref >60)
Glucose, Bld: 134 mg/dL — ABNORMAL HIGH (ref 70–99)
Potassium: 4.9 mmol/L (ref 3.5–5.1)
Sodium: 141 mmol/L (ref 135–145)
Total Bilirubin: 0.3 mg/dL (ref 0.3–1.2)
Total Protein: 6.9 g/dL (ref 6.5–8.1)

## 2019-06-22 NOTE — ED Triage Notes (Signed)
Patient was seen last week and was prescribed an antibiotic, but when culture came back the med he wa taking was resistant. Patient was called to come back for a different IV antibiotics.

## 2019-06-22 NOTE — ED Provider Notes (Signed)
[facility] DEPT Provider Note   CSN: [id number] Arrival date & time: 06/22/19  1244     History Chief Complaint  Patient presents with  . Abnormal Lab    [name] [name] is a 86 y.o. male past medical history significant for aortic valve stenosis, CKD, DVT, hypertension, bradycardia with dual chamber pacemaker, macular degeneration, prostate and bladder cancer presents to emergency room today with chief complaint of abnormal urine culture. Patient is accompanied by his daughter who is contributing historian. Patient was seen in the emergency department on 06/17/2019 for left-sided abdominal pain.  He was found to have possible UA with leukocytes there was concern for contaminated sample as it came from his urostomy.  Patient was treated with UTI with Keflex. Urine culture shows  >100,000 colonies/mL pseudomonas aeruginosa and >100,000 colonies/mL vancomycin resistant enterococcus.  Pharmacy followed up with patient and as he was still symptomatic so it was recommended he present to the emergency department for reevaluation.  Patient reports left-sided abdominal pain resolved however he has had decreased p.o. intake and generalized weakness.  He denies any fever, chills, chest pain, nausea, hematuria, vomiting.  He does have chronic diarrhea which is unchanged today.   Past Medical History:  Diagnosis Date  . Aortic valve stenosis    a. mild to moderate by echo 2013  . Bradycardia    MDT dual chamber pacemaker  . Cancer [facility])    bladder cancer  . Chronic kidney disease   . Deep venous thrombosis (HCC)    hx of  LEFT ARM AFTER PACEMAKER INSERTION ABOUT 6 YRS AGO  . Depression   . Gout   . Hypertension   . Kidney stone   . Macular degeneration    PT STATES HIS VISION STILL OKAY FOR DRIVING  . Osteoporosis   . Prostate cancer ([place])    AND BLADDER CANCER - S/P URETEROILEAL CONDUIT  . Renal disorder   . S/P ileal conduit [facility])         Patient  Active Problem List   Diagnosis Date Noted  . Enterococcal bacteremia 04/27/2019  . Choledocholithiasis 04/27/2019  . Status post ileal conduit ([place]) 04/27/2019  . Macrocytic anemia 04/27/2019  . HOH (hard of hearing) 04/27/2019  . History of prostate cancer 04/27/2019  . Abnormal liver function 04/25/2019  . Acute on chronic diastolic CHF (congestive heart failure) ([place]) 12/09/2018  . Legally blind 06/13/2017  . Bladder cancer ([place]) 06/13/2017  . Sepsis ([place]) 02/04/2014  . Acute lower UTI 07/30/2013  . CKD (chronic kidney disease), stage IV ([place]) 07/29/2013  . Hydronephrosis, right 07/15/2013  . Kidney stone 08/20/2011  . Obstructive uropathy 08/16/2011  . Gout 08/16/2011  . Aortic stenosis 08/16/2011  . Hypertension 10/02/2010  . Pacemaker 10/02/2010  . Sinus bradycardia 10/02/2010    Past Surgical History:  Procedure Laterality Date  . 07/20/13  CONVERSION OF RIGHT SIDED NEPHROSTOMY CATHETER TO RIGHT SIDED NEPHRO URETERAL CATHETER - DONE IN INTERVENTIONAL RADIOLOGY    . APPENDECTOMY    . BALLOON DILATION N/A 04/27/2019   Procedure: BALLOON DILATION;  Surgeon: Clarene Essex, MD;  Location: [facility] ENDOSCOPY;  Service: Endoscopy;  Laterality: N/A;  pyloric   . CATARACT EXTRACTION, BILATERAL    . CYSTECTOMY    . ERCP N/A 04/27/2019   Procedure: ENDOSCOPIC RETROGRADE CHOLANGIOPANCREATOGRAPHY (ERCP);  Surgeon: Clarene Essex, MD;  Location: [facility];  Service: Endoscopy;  Laterality: N/A;  . ESOPHAGOGASTRODUODENOSCOPY N/A 04/27/2019   Procedure: ESOPHAGOGASTRODUODENOSCOPY (EGD);  Surgeon: Clarene Essex, MD;  Location: [facility] ENDOSCOPY;  Service: Endoscopy;  Laterality: N/A;  . ilial conduit     for bladder cancer  . IR GENERIC HISTORICAL  10/26/2015   IR NEPHROSTOMY TUBE CHANGE 10/26/2015 Jacqulynn Cadet, MD [facility]-INTERV RAD  . IR GENERIC HISTORICAL  12/07/2015   IR NEPHROSTOMY TUBE CHANGE 12/07/2015 Greggory Keen, MD [facility]  . IR GENERIC HISTORICAL  01/11/2016   IR NEPHROSTOMY TUBE CHANGE  01/11/2016 Greggory Keen, MD [facility]  . IR GENERIC HISTORICAL  02/08/2016   IR NEPHROSTOMY TUBE CHANGE 02/08/2016 [facility]  . IR GENERIC HISTORICAL  03/14/2016   IR NEPHROSTOMY TUBE CHANGE 03/14/2016 Jacqulynn Cadet, MD [facility]  . IR GENERIC HISTORICAL  04/22/2016   IR NEPHROSTOMY TUBE CHANGE 04/22/2016 Aletta Edouard, MD [facility]  . IR GENERIC HISTORICAL  05/27/2016   IR NEPHROSTOMY TUBE CHANGE 05/27/2016 Arne Cleveland, MD [facility]  . IR NEPHROSTOMY EXCHANGE RIGHT  07/25/2017  . IR NEPHROSTOMY EXCHANGE RIGHT  10/13/2017  . IR NEPHROSTOMY EXCHANGE RIGHT  11/18/2017  . IR NEPHROSTOMY EXCHANGE RIGHT  12/30/2017  . IR NEPHROSTOMY EXCHANGE RIGHT  02/10/2018  . IR NEPHROSTOMY EXCHANGE RIGHT  03/23/2018  . IR NEPHROSTOMY EXCHANGE RIGHT  05/04/2018  . IR NEPHROSTOMY EXCHANGE RIGHT  06/25/2018  . IR NEPHROSTOMY EXCHANGE RIGHT  08/13/2018  . IR NEPHROSTOMY EXCHANGE RIGHT  10/01/2018  . IR NEPHROSTOMY EXCHANGE RIGHT  11/19/2018  . IR NEPHROSTOMY EXCHANGE RIGHT  01/05/2019  . IR NEPHROSTOMY EXCHANGE RIGHT  04/01/2019  . IR NEPHROSTOMY EXCHANGE RIGHT  05/18/2019  . IR NEPHROSTOMY PLACEMENT RIGHT  06/13/2017  . IR NEPHROSTOMY PLACEMENT RIGHT  02/23/2019  . IR NEPHROSTOMY TUBE CHANGE  07/01/2016  . IR NEPHROSTOMY TUBE CHANGE  08/05/2016  . IR NEPHROSTOMY TUBE CHANGE  09/18/2016  . IR NEPHROSTOMY TUBE CHANGE  10/24/2016  . IR NEPHROSTOMY TUBE CHANGE  11/18/2016  . IR NEPHROSTOMY TUBE CHANGE  12/30/2016  . IR NEPHROSTOMY TUBE CHANGE  02/03/2017  . IR NEPHROSTOMY TUBE CHANGE  03/13/2017  . IR NEPHROSTOMY TUBE CHANGE  04/17/2017  . IR NEPHROSTOMY TUBE CHANGE  05/28/2017  . IR NEPHROSTOMY TUBE CHANGE  09/04/2017  . KIDNEY STONE SURGERY    . LYMPHADENECTOMY    . NEPHROLITHOTOMY  09/02/2011   Procedure: NEPHROLITHOTOMY PERCUTANEOUS;  Surgeon: Malka So, MD;  Location: [facility] ORS;  Service: Urology;  Laterality: Left;  . NEPHROLITHOTOMY Right 07/27/2013   Procedure: RIGHT PERCUTANEOUS NEPHROLITHOTOMY ;   Surgeon: Irine Seal, MD;  Location: [facility] ORS;  Service: Urology;  Laterality: Right;  . PACEMAKER INSERTION  2009   MDT dual chamber pacemaker implanted by Dr Verlon Setting  . PROSTATE SURGERY    . REMOVAL OF STONES  04/27/2019   Procedure: REMOVAL OF STONES;  Surgeon: Clarene Essex, MD;  Location: [facility];  Service: Endoscopy;;  . Joan Mayans  04/27/2019   Procedure: Joan Mayans;  Surgeon: Clarene Essex, MD;  Location: [facility] ENDOS[facility];  Service: Endoscopy;;       Family History  Problem Relation Age of Onset  . Pancreatic cancer Father 63       died  . Pancreatic cancer Mother 22       died    Social History   Tobacco Use  . Smoking status: Former Smoker    Types: Cigarettes    Quit date: 03/25/1952    Years since quitting: 67.2  . Smokeless tobacco: Never Used  Substance Use Topics  . Alcohol use: No  . Drug use: No    Home Medications Prior  to Admission medications   Medication Sig Start Date End Date Taking? Authorizing Provider  acetaminophen (TYLENOL) 500 MG tablet Take 1,000 mg by mouth every 8 (eight) hours as needed for mild pain or headache.    [provider]  aspirin EC 81 MG tablet Take 81 mg by mouth every morning.    [provider]  Besifloxacin HCl (BESIVANCE) 0.6 % SUSP Apply 1 drop to eye See admin instructions. Only uses before eye shot he receives every 10 weeks.    [provider]  cephALEXin (KEFLEX) 250 MG/5ML suspension Take 5 mLs (250 mg total) by mouth daily for 7 days. 06/17/19 06/24/19  Sherwood Gambler, MD  Cholecalciferol (VITAMIN D-3) 1000 UNITS CAPS Take 1 capsule by mouth every morning.    [provider]  colchicine 0.6 MG tablet Take 0.6 mg by mouth as needed (gout).     [provider]  docusate sodium (COLACE) 100 MG capsule Take 300 mg by mouth daily.     [provider]  furosemide (LASIX) 20 MG tablet Take 1 tablet (20 mg total) by mouth daily. 12/16/18 12/11/19  Burnell Blanks, MD   Hyprom-Naphaz-Polysorb-Zn Sulf (CLEAR EYES COMPLETE OP) Apply 1-2 drops to eye daily as needed (dry eyes).    [provider]  levothyroxine (SYNTHROID) 25 MCG tablet Take 25 mcg by mouth every morning. 04/07/19   [provider]  metoprolol succinate (TOPROL-XL) 12.5 mg TB24 24 hr tablet Take 12.5 mg by mouth daily.    [provider]  Multiple Vitamins-Minerals (VITEYES AREDS ADVANCED PO) Take 2 capsules by mouth daily.    [provider]  OMEGA-3 KRILL OIL PO Take 1 tablet by mouth every morning.    [provider]  ondansetron (ZOFRAN ODT) 4 MG disintegrating tablet Take 0.5 tablets (2 mg total) by mouth every 8 (eight) hours as needed for nausea or vomiting. 06/17/19   Sherwood Gambler, MD  pantoprazole (PROTONIX) 40 MG tablet Take 1 tablet (40 mg total) by mouth daily. 05/05/19 06/04/19  Donne Hazel, MD  polyethylene glycol powder (GLYCOLAX/MIRALAX) 17 GM/SCOOP powder Take 17 g by mouth daily as needed for mild constipation. Takes 17grams (1 capful) as needed. 12/12/18   Mariel Aloe, MD  ULORIC 40 MG tablet Take 1 tablet by mouth daily. 11/24/15   [provider]    Allergies    Patient has no known allergies.  Review of Systems   Review of Systems  All other systems are reviewed and are negative for acute change except as noted in the HPI.   Physical Exam Updated Vital Signs BP 109/87 (BP Location: Right Arm)   Pulse 60   Temp 97.6 F (36.4 C) (Oral)   Resp 18   Ht 5\' 7"  (1.702 m)   Wt 66.7 kg   SpO2 100%   BMI 23.02 kg/m   Physical Exam Vitals and nursing note reviewed.  Constitutional:      General: He is not in acute distress.    Appearance: He is not ill-appearing.  HENT:     Head: Normocephalic and atraumatic.     Right Ear: Tympanic membrane and external ear normal.     Left Ear: Tympanic membrane and external ear normal.     Nose: Nose normal.     Mouth/Throat:     Mouth: Mucous membranes are moist.      Pharynx: Oropharynx is clear.  Eyes:     General: No scleral icterus.  Right eye: No discharge.        Left eye: No discharge.     Extraocular Movements: Extraocular movements intact.     Conjunctiva/sclera: Conjunctivae normal.     Pupils: Pupils are equal, round, and reactive to light.  Neck:     Vascular: No JVD.  Cardiovascular:     Rate and Rhythm: Normal rate. Rhythm irregular.     Pulses: Normal pulses.          Radial pulses are 2+ on the right side and 2+ on the left side.     Heart sounds: Murmur present.  Pulmonary:     Comments: Lungs clear to auscultation in all fields. Symmetric chest rise. No wheezing, rales, or rhonchi. Abdominal:     Comments: Abdomen is soft, non-distended, and non-tender in all quadrants. No rigidity, no guarding. No peritoneal signs.  Left-sided urostomy and right sided nephrostomy both with empty bags.  Musculoskeletal:        General: Normal range of motion.     Cervical back: Normal range of motion.  Skin:    General: Skin is warm and dry.     Capillary Refill: Capillary refill takes less than 2 seconds.  Neurological:     Mental Status: He is oriented to person, place, and time.     GCS: GCS eye subscore is 4. GCS verbal subscore is 5. GCS motor subscore is 6.     Comments: Fluent speech, no facial droop.  Psychiatric:        Behavior: Behavior normal.     ED Results / Procedures / Treatments   Labs (all labs ordered are listed, but only abnormal results are displayed) Labs Reviewed  CBC WITH DIFFERENTIAL/PLATELET - Abnormal; Notable for the following components:      Result Value   RBC 3.22 (*)    Hemoglobin 10.4 (*)    HCT 33.5 (*)    MCV 104.0 (*)    All other components within normal limits  COMPREHENSIVE METABOLIC PANEL - Abnormal; Notable for the following components:   Glucose, Bld 134 (*)    BUN 54 (*)    Creatinine, Ser 2.35 (*)    Calcium 8.4 (*)    Albumin 2.8 (*)    GFR calc non Af Amer 23 (*)    GFR calc  Af Amer 26 (*)    All other components within normal limits  CULTURE, BLOOD (ROUTINE X 2)  CULTURE, BLOOD (ROUTINE X 2)    EKG None  Radiology No results found.  Procedures Procedures (including critical care time)  Medications Ordered in ED Medications - No data to display  ED Course  I have reviewed the triage vital signs and the nursing notes.  Pertinent labs & imaging results that were available during my care of the patient were reviewed by me and considered in my medical decision making (see chart for details).    MDM Rules/Calculators/A&P                      Patient seen and examined. Patient presents awake, alert, hemodynamically stable, afebrile, non toxic. No tachycardia or hypoxia.  On exam he is overall well-appearing.  No abdominal tenderness, no peritoneal signs.  No CVA tenderness.   Labs today show no leukocytosis, hemoglobin is lower today at 10.4 compared to x 5 days ago and was 11.8.  CMP shows creatinine is elevated at 2.35 however his baseline appears to be around 2. Blood cultures collected to  rule out further infection.  Case discussed with on-call ID Dr. Tommy Medal  who recommends discussing case with on-call urologist to attempt to obtain better urine sample as he suspects the urostomy bag could be colonized given the culture findings. Case discussed with on call urologist Dr. Tresa Moore who does not recommend antibiotic treatment as his urine culture is very likely to be contaminated by the bacteria colonized in urostomy bag and patient is not tachycardic, no hematuria, and patient is not having any constitutional symptoms.   The patient appears reasonably screened and/or stabilized for discharge and I doubt any other medical condition or other [facility] requiring further screening, evaluation, or treatment in the ED at this time prior to discharge. The patient is safe for discharge with strict return precautions discussed. Recommend pcp and urology follow up.The patient  was discussed with and seen by Dr. Ronnald Nian who agrees with the treatment plan.   Portions of this note were generated with L[employer]. Dictation errors may occur despite best attempts at proofreading.   Final Clinical Impression(s) / ED Diagnoses Final diagnoses:  Worried well    Rx / DC Orders ED Discharge Orders    None       Cherre Robins, PA-C 06/22/19 1442    Lennice Sites, DO 06/22/19 1507

## 2019-06-22 NOTE — ED Provider Notes (Signed)
Medical screening examination/treatment/procedure(s) were conducted as a shared visit with non-physician practitioner(s) and myself.  I personally evaluated the patient during the encounter. Briefly, the patient is a 86 y.o. male here after urine culture was positive for Pseudomonas and vancomycin-resistant Enterococcus.  However patient with normal vitals.  No fever.  No abdominal pain.  It has been 5 days since he was diagnosed the patient clinically is doing well.  No leukocytosis, no significant anemia.  Creatinine around baseline at 2.3.  Patient physically seems to be improving.  Talked with both infectious disease and urology and believe that this is likely colonization of his urostomy, nephrostomy.  Blood cultures were collected but will have patient remain on current antibiotic regimen.  Given return precautions and discharged in the ED in good condition.  This chart was dictated using voice recognition software.  Despite best efforts to proofread,  errors can occur which can change the documentation meaning.     EKG Interpretation None           Lennice Sites, DO 06/22/19 1431

## 2019-06-22 NOTE — Discharge Instructions (Addendum)
You have been seen today for possible UTI. Please read and follow all provided instructions. Return to the emergency room for worsening condition or new concerning symptoms.    Today you thankfully do not show signs of infection because you do not have fever, elevated white blood cell count, elevated heart rate.   1. Medications:  Continue usual home medications Take medications as prescribed. Please review all of the medicines and only take them if you do not have an allergy to them.   2. Treatment: rest, drink plenty of fluids  3. Follow Up:  Please follow up with primary care provider by scheduling an appointment as soon as possible for a visit  -Follow up with urology if needed   It is also a possibility that you have an allergic reaction to any of the medicines that you have been prescribed - Everybody reacts differently to medications and while MOST people have no trouble with most medicines, you may have a reaction such as nausea, vomiting, rash, swelling, shortness of breath. If this is the case, please stop taking the medicine immediately and contact your physician.  ?

## 2019-06-23 ENCOUNTER — Encounter: Payer: Medicare HMO | Admitting: Internal Medicine

## 2019-06-23 IMAGING — XA IR BILIARY CATHETER EXCHANGE
1 series · 5 of 5 positions shown · non-contrast
Comparison: none

CLINICAL DATA: Bladder carcinoma post ileal conduit creation and
ureteral stricture requiring nephrostomy drainage. Presents for
routine exchange.

EXAM:
RIGHT PERCUTANEOUS NEPHROSTOMY CATHETER EXCHANGE UNDER FLUOROSCOPY
FLUOROSCOPY TIME:  0.6 minute; 41 uGym2 DAP
TECHNIQUE: The procedure, risks (including but not limited to bleeding,
infection, organ damage ), benefits, and alternatives were explained
to the patient. Questions regarding the procedure were encouraged
and answered. The patient understands and consents to the procedure.
The nephrostomy tube and surrounding skin were prepped with
Betadine, draped in usual sterile fashion.

[Series 300: ir nephrostomy tube change · 5 of 5 slices shown]
[im 1/5]
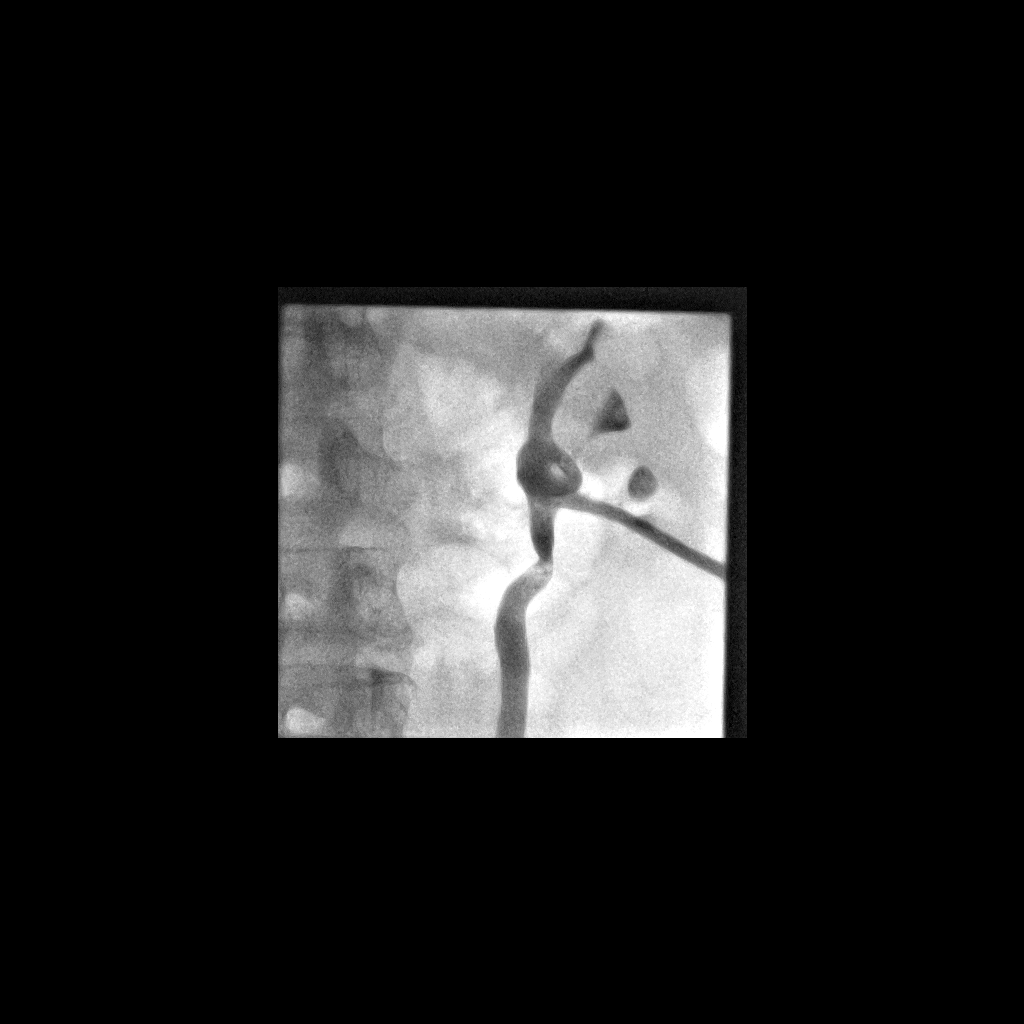
[im 2/5]
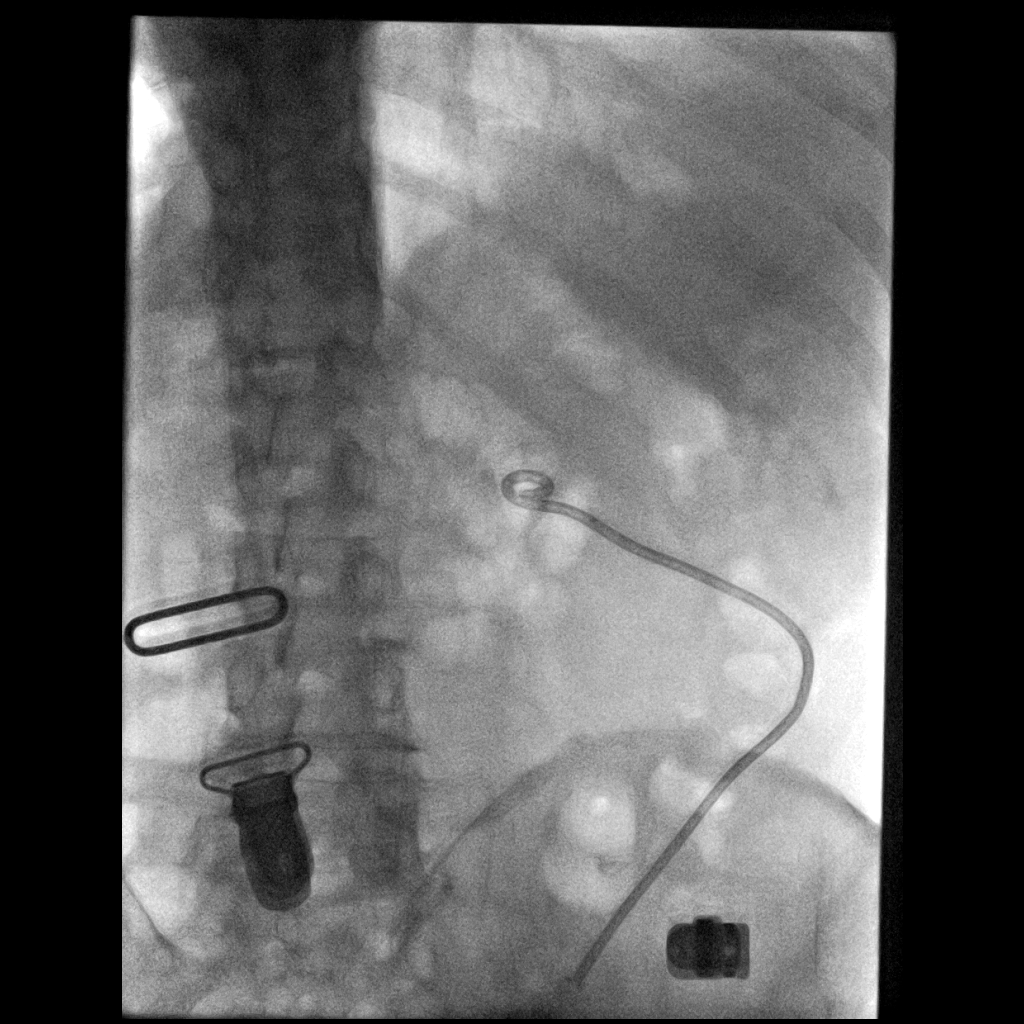
[im 3/5]
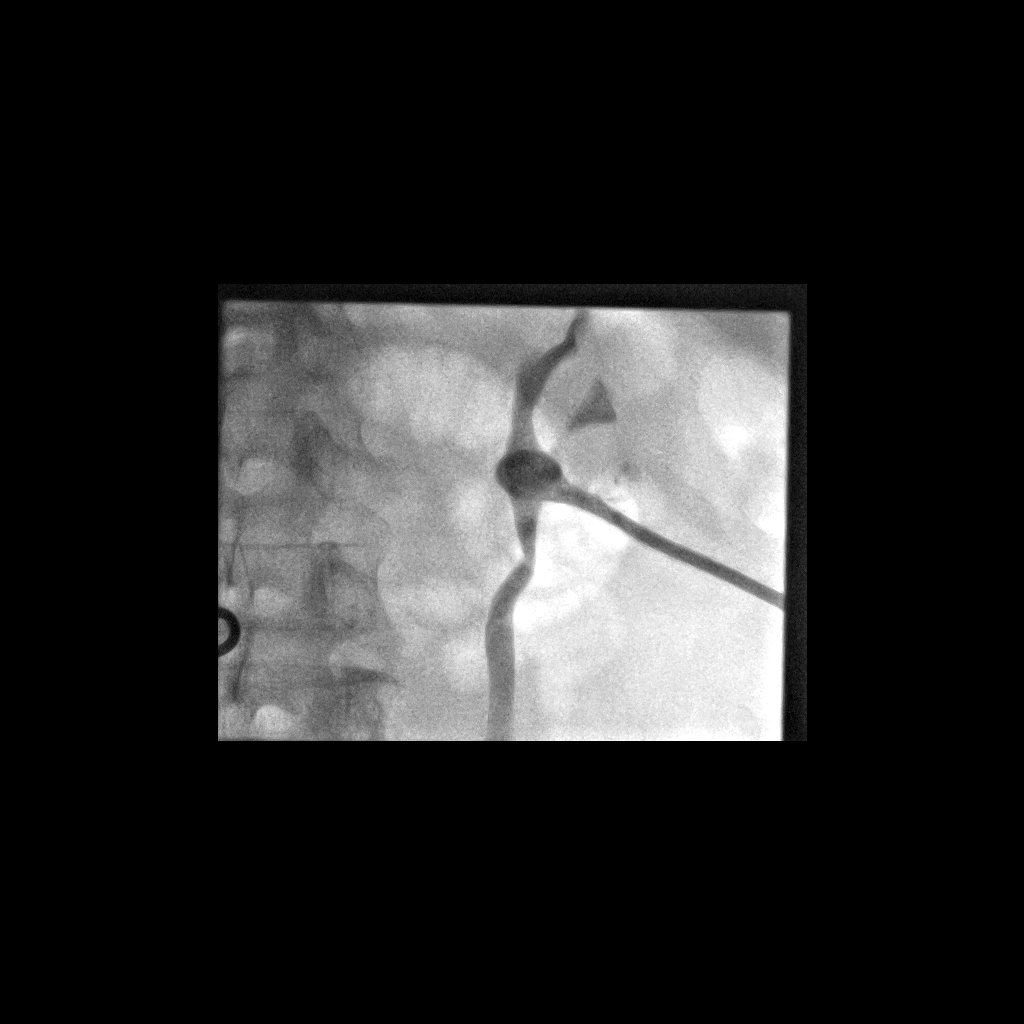
[im 4/5]
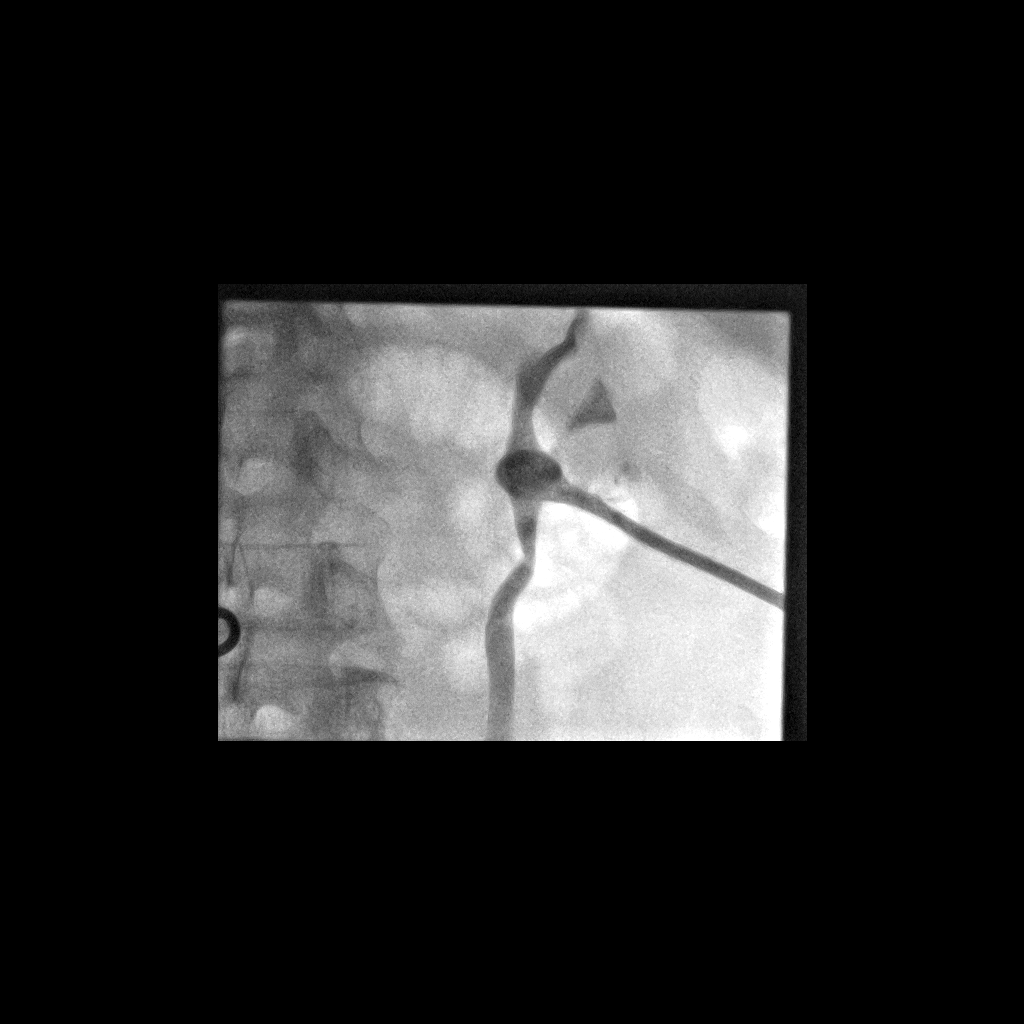
[im 5/5]
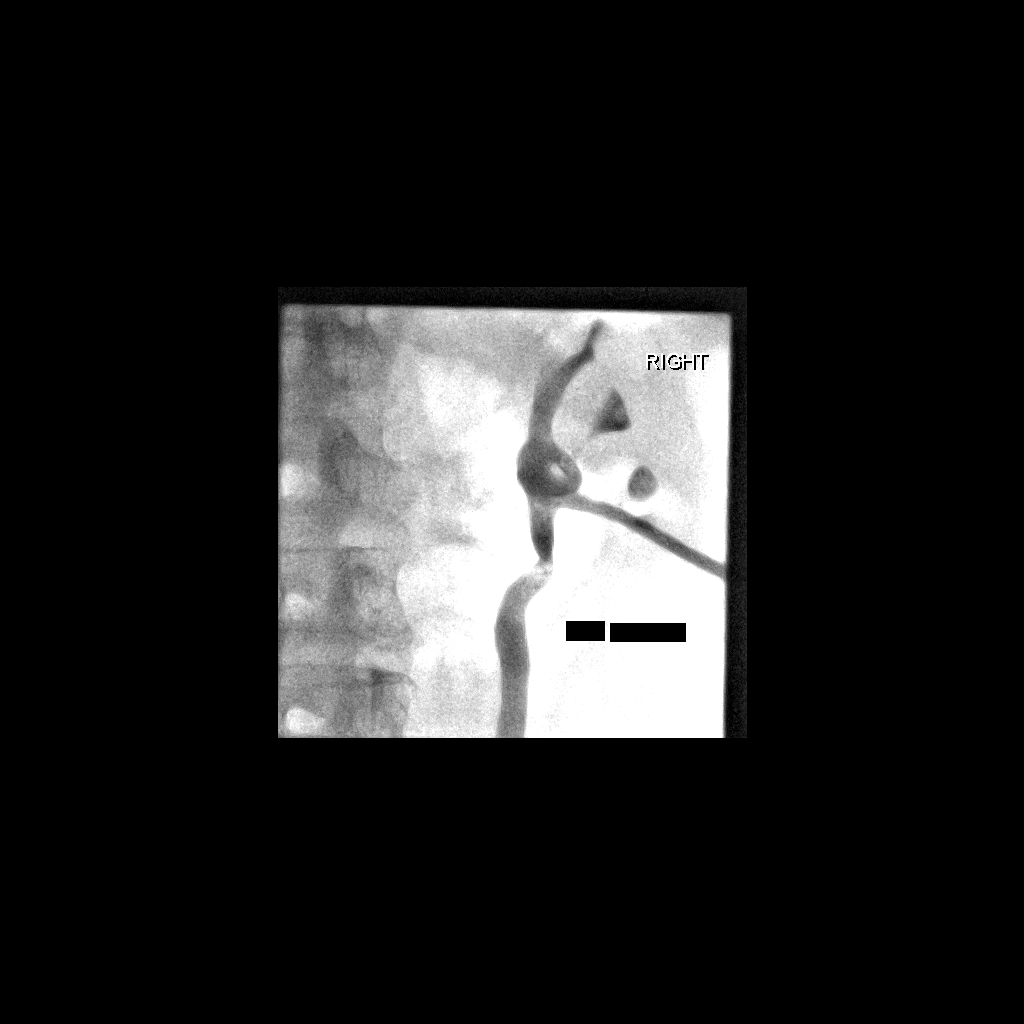

[5 of 5 positions shown; findings below may reference images not displayed]

A small amount of contrast was injected through the right
nephrostomy catheter to opacify the renal collecting system. The
catheter was cut and exchanged over a 0.035" angiographic wire for a
new 10-Nickson Mickel pigtail catheter, formed centrally
within the collecting system under fluoroscopy. Contrast injection
confirms appropriate positioning.

Catheter secured externally with 0 Prolene suture and placed to
gravity drainage. The patient tolerated the procedure well.

COMPLICATIONS:
None immediate
IMPRESSION: 1. Technically successful exchange of right Aarti Olivo
nephrostomy catheter under fluoroscopy

## 2019-06-24 ENCOUNTER — Telehealth: Payer: Self-pay | Admitting: Emergency Medicine

## 2019-06-24 DIAGNOSIS — Z950 Presence of cardiac pacemaker: Secondary | ICD-10-CM | POA: Diagnosis not present

## 2019-06-24 DIAGNOSIS — I350 Nonrheumatic aortic (valve) stenosis: Secondary | ICD-10-CM | POA: Diagnosis not present

## 2019-06-24 DIAGNOSIS — I10 Essential (primary) hypertension: Secondary | ICD-10-CM | POA: Diagnosis not present

## 2019-06-24 DIAGNOSIS — E039 Hypothyroidism, unspecified: Secondary | ICD-10-CM | POA: Diagnosis not present

## 2019-06-24 DIAGNOSIS — N390 Urinary tract infection, site not specified: Secondary | ICD-10-CM | POA: Diagnosis not present

## 2019-06-24 DIAGNOSIS — I8290 Acute embolism and thrombosis of unspecified vein: Secondary | ICD-10-CM | POA: Diagnosis not present

## 2019-06-24 DIAGNOSIS — I509 Heart failure, unspecified: Secondary | ICD-10-CM | POA: Diagnosis not present

## 2019-06-24 DIAGNOSIS — N184 Chronic kidney disease, stage 4 (severe): Secondary | ICD-10-CM | POA: Diagnosis not present

## 2019-06-24 DIAGNOSIS — R2689 Other abnormalities of gait and mobility: Secondary | ICD-10-CM | POA: Diagnosis not present

## 2019-06-24 DIAGNOSIS — E875 Hyperkalemia: Secondary | ICD-10-CM | POA: Diagnosis not present

## 2019-06-27 LAB — CULTURE, BLOOD (ROUTINE X 2)
Culture: NO GROWTH
Culture: NO GROWTH
Special Requests: ADEQUATE
Special Requests: ADEQUATE

## 2019-06-29 ENCOUNTER — Other Ambulatory Visit: Payer: Self-pay

## 2019-06-29 ENCOUNTER — Ambulatory Visit (HOSPITAL_COMMUNITY)
Admission: RE | Admit: 2019-06-29 | Discharge: 2019-06-29 | Disposition: A | Discharge status: Home / Self Care | Source: Ambulatory Visit | Attending: Interventional Radiology | Admitting: Interventional Radiology

## 2019-06-29 ENCOUNTER — Other Ambulatory Visit (HOSPITAL_COMMUNITY): Payer: Self-pay | Admitting: Interventional Radiology

## 2019-06-29 DIAGNOSIS — Z436 Encounter for attention to other artificial openings of urinary tract: Secondary | ICD-10-CM | POA: Insufficient documentation

## 2019-06-29 DIAGNOSIS — N135 Crossing vessel and stricture of ureter without hydronephrosis: Secondary | ICD-10-CM | POA: Diagnosis not present

## 2019-06-29 HISTORY — PX: IR NEPHROSTOMY EXCHANGE RIGHT: IMG6070

## 2019-06-29 MED ORDER — IOHEXOL 300 MG/ML  SOLN
50.0000 mL | Freq: Once | INTRAMUSCULAR | Status: AC | PRN
  Administered 2019-06-29: 16:00:00 8 mL

## 2019-06-29 MED ORDER — LIDOCAINE HCL 1 % IJ SOLN
INTRAMUSCULAR | Status: AC
  Filled 2019-06-29: qty 20

## 2019-06-30 ENCOUNTER — Ambulatory Visit (INDEPENDENT_AMBULATORY_CARE_PROVIDER_SITE_OTHER): Payer: Medicare HMO | Admitting: *Deleted

## 2019-06-30 DIAGNOSIS — I5033 Acute on chronic diastolic (congestive) heart failure: Secondary | ICD-10-CM | POA: Diagnosis not present

## 2019-06-30 LAB — CUP PACEART REMOTE DEVICE CHECK
Battery Impedance: 2888 Ohm
Battery Remaining Longevity: 22 mo
Battery Voltage: 2.74 V
Brady Statistic AP VP Percent: 58 %
Brady Statistic AP VS Percent: 38 %
Brady Statistic AS VP Percent: 3 %
Brady Statistic AS VS Percent: 2 %
Date Time Interrogation Session: 20210407071100
Implantable Lead Implant Date: 20090917
Implantable Lead Implant Date: 20090917
Implantable Lead Location: 753859
Implantable Lead Location: 753860
Implantable Lead Model: 5076
Implantable Lead Model: 5076
Implantable Pulse Generator Implant Date: 20090917
Lead Channel Impedance Value: 438 Ohm
Lead Channel Impedance Value: 516 Ohm
Lead Channel Pacing Threshold Amplitude: 0.375 V
Lead Channel Pacing Threshold Amplitude: 0.5 V
Lead Channel Pacing Threshold Pulse Width: 0.4 ms
Lead Channel Pacing Threshold Pulse Width: 0.4 ms
Lead Channel Setting Pacing Amplitude: 2 V
Lead Channel Setting Pacing Amplitude: 2.5 V
Lead Channel Setting Pacing Pulse Width: 0.4 ms
Lead Channel Setting Sensing Sensitivity: 2 mV

## 2019-06-30 NOTE — Progress Notes (Signed)
ICD Remote  

## 2019-07-05 DIAGNOSIS — E038 Other specified hypothyroidism: Secondary | ICD-10-CM | POA: Diagnosis not present

## 2019-07-05 DIAGNOSIS — E7849 Other hyperlipidemia: Secondary | ICD-10-CM | POA: Diagnosis not present

## 2019-07-13 ENCOUNTER — Ambulatory Visit: Payer: Medicare HMO | Admitting: Internal Medicine

## 2019-07-28 ENCOUNTER — Encounter (INDEPENDENT_AMBULATORY_CARE_PROVIDER_SITE_OTHER): Payer: Medicare Other | Admitting: Ophthalmology

## 2019-07-28 DIAGNOSIS — H353221 Exudative age-related macular degeneration, left eye, with active choroidal neovascularization: Secondary | ICD-10-CM

## 2019-07-28 DIAGNOSIS — H353114 Nonexudative age-related macular degeneration, right eye, advanced atrophic with subfoveal involvement: Secondary | ICD-10-CM

## 2019-07-28 DIAGNOSIS — I10 Essential (primary) hypertension: Secondary | ICD-10-CM

## 2019-07-28 DIAGNOSIS — H43813 Vitreous degeneration, bilateral: Secondary | ICD-10-CM | POA: Diagnosis not present

## 2019-07-28 DIAGNOSIS — H35033 Hypertensive retinopathy, bilateral: Secondary | ICD-10-CM | POA: Diagnosis not present

## 2019-08-09 ENCOUNTER — Other Ambulatory Visit: Payer: Self-pay

## 2019-08-09 ENCOUNTER — Ambulatory Visit (HOSPITAL_COMMUNITY)
Admission: RE | Admit: 2019-08-09 | Discharge: 2019-08-09 | Disposition: A | Discharge status: Home / Self Care | Source: Ambulatory Visit | Attending: Interventional Radiology | Admitting: Interventional Radiology

## 2019-08-09 ENCOUNTER — Other Ambulatory Visit (HOSPITAL_COMMUNITY): Payer: Self-pay | Admitting: Interventional Radiology

## 2019-08-09 DIAGNOSIS — N135 Crossing vessel and stricture of ureter without hydronephrosis: Secondary | ICD-10-CM

## 2019-08-09 DIAGNOSIS — Z436 Encounter for attention to other artificial openings of urinary tract: Secondary | ICD-10-CM | POA: Insufficient documentation

## 2019-08-09 HISTORY — PX: IR NEPHROSTOMY EXCHANGE RIGHT: IMG6070

## 2019-08-09 MED ORDER — IOHEXOL 300 MG/ML  SOLN
50.0000 mL | Freq: Once | INTRAMUSCULAR | Status: AC | PRN
  Administered 2019-08-09: 15 mL

## 2019-08-09 MED ORDER — LIDOCAINE HCL 1 % IJ SOLN
INTRAMUSCULAR | Status: AC
  Filled 2019-08-09: qty 20

## 2019-08-10 ENCOUNTER — Other Ambulatory Visit (HOSPITAL_COMMUNITY): Payer: Medicare HMO

## 2019-08-20 DIAGNOSIS — E875 Hyperkalemia: Secondary | ICD-10-CM | POA: Diagnosis not present

## 2019-08-20 DIAGNOSIS — R2689 Other abnormalities of gait and mobility: Secondary | ICD-10-CM | POA: Diagnosis not present

## 2019-08-20 DIAGNOSIS — I10 Essential (primary) hypertension: Secondary | ICD-10-CM | POA: Diagnosis not present

## 2019-08-20 DIAGNOSIS — Z950 Presence of cardiac pacemaker: Secondary | ICD-10-CM | POA: Diagnosis not present

## 2019-08-20 DIAGNOSIS — I509 Heart failure, unspecified: Secondary | ICD-10-CM | POA: Diagnosis not present

## 2019-08-20 DIAGNOSIS — I350 Nonrheumatic aortic (valve) stenosis: Secondary | ICD-10-CM | POA: Diagnosis not present

## 2019-08-20 DIAGNOSIS — E039 Hypothyroidism, unspecified: Secondary | ICD-10-CM | POA: Diagnosis not present

## 2019-08-20 DIAGNOSIS — M109 Gout, unspecified: Secondary | ICD-10-CM | POA: Diagnosis not present

## 2019-08-20 DIAGNOSIS — Z936 Other artificial openings of urinary tract status: Secondary | ICD-10-CM | POA: Diagnosis not present

## 2019-08-20 DIAGNOSIS — N184 Chronic kidney disease, stage 4 (severe): Secondary | ICD-10-CM | POA: Diagnosis not present

## 2019-09-06 IMAGING — XA IR EXCHANGE NEPHROSTOMY RIGHT
1 series · 2 of 2 positions shown · non-contrast
Comparison: none

CLINICAL DATA: Bladder carcinoma post ileal conduit creation and
ureteral stricture requiring nephrostomy drainage. Presents for
routine exchange.

EXAM:
RIGHT PERCUTANEOUS NEPHROSTOMY CATHETER EXCHANGE UNDER FLUOROSCOPY
FLUOROSCOPY TIME:  0.5 minute; 44 u3ymV DAP seconds
TECHNIQUE: The procedure, risks (including but not limited to bleeding,
infection, organ damage ), benefits, and alternatives were explained
to the patient. Questions regarding the procedure were encouraged
and answered. The patient understands and consents to the procedure.
The nephrostomy tube and surrounding skin were prepped with
Betadine, draped in usual sterile fashion.

[Series 300: tube placements · 2 of 2 slices shown]
[im 1/2]
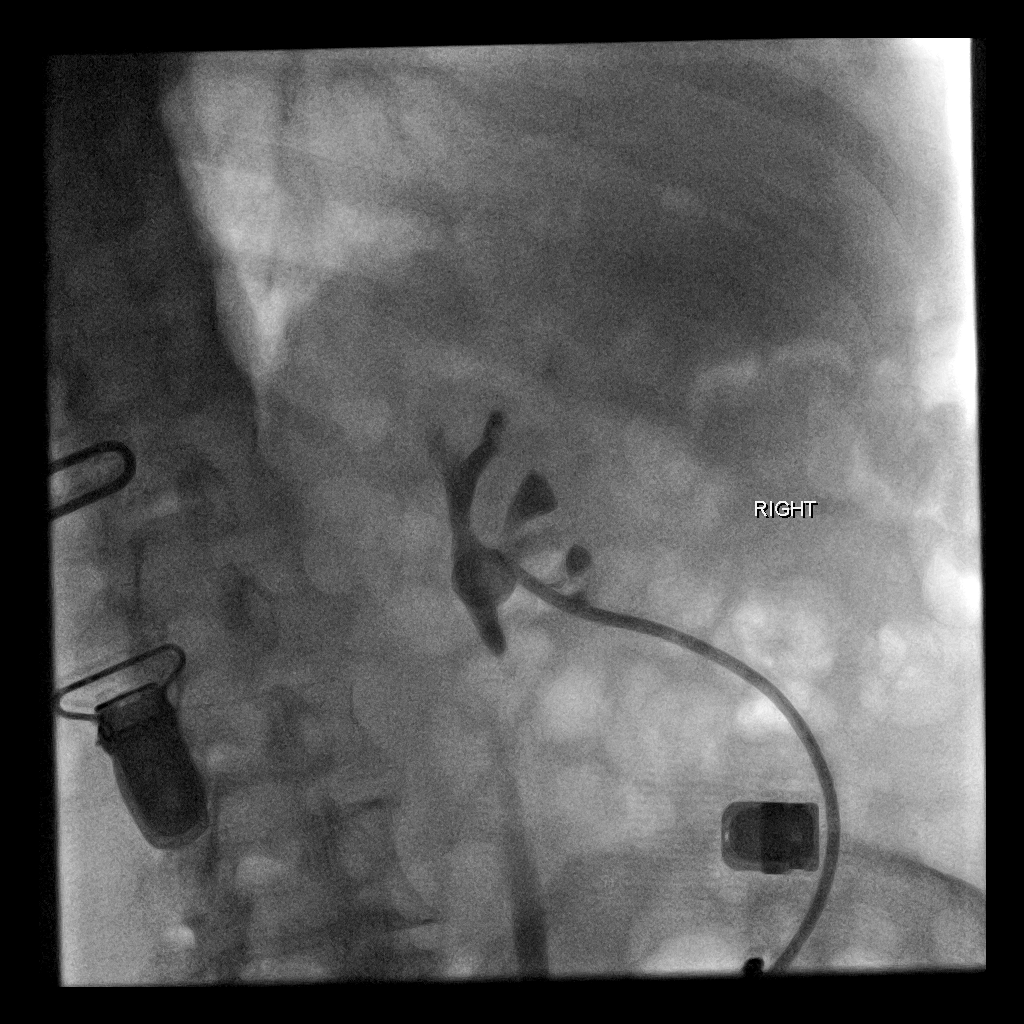
[im 2/2]
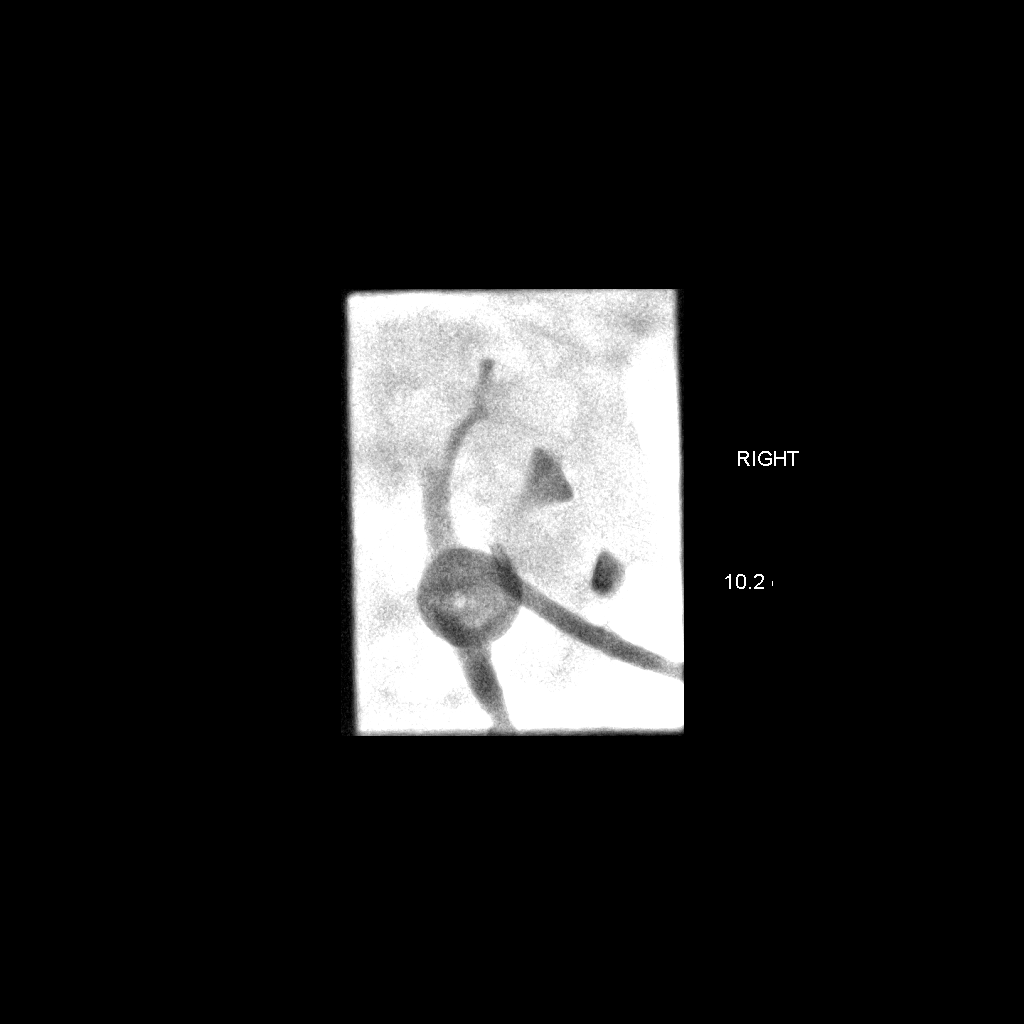

[2 of 2 positions shown; findings below may reference images not displayed]

A small amount of contrast was injected through the right
nephrostomy catheter to opacify the renal collecting system. The
catheter was cut and exchanged over a 0.035" angiographic wire for a
new 10-Gerson Daniel Gacia pigtail catheter, formed centrally
within the collecting system under fluoroscopy. Contrast injection
confirms appropriate positioning.

Catheter secured externally with 0 Prolene suture and placed to
gravity drainage. The patient tolerated the procedure well.

COMPLICATIONS:
None immediate
IMPRESSION: 1. Technically successful exchange of right Infolik Gierens
nephrostomy catheter under fluoroscopy

## 2019-09-20 ENCOUNTER — Ambulatory Visit (HOSPITAL_COMMUNITY)
Admission: RE | Admit: 2019-09-20 | Discharge: 2019-09-20 | Disposition: A | Discharge status: Home / Self Care | Source: Ambulatory Visit | Attending: Interventional Radiology | Admitting: Interventional Radiology

## 2019-09-20 ENCOUNTER — Other Ambulatory Visit (HOSPITAL_COMMUNITY): Payer: Self-pay | Admitting: Interventional Radiology

## 2019-09-20 ENCOUNTER — Other Ambulatory Visit (HOSPITAL_COMMUNITY): Payer: Self-pay | Admitting: Diagnostic Radiology

## 2019-09-20 ENCOUNTER — Other Ambulatory Visit: Payer: Self-pay

## 2019-09-20 DIAGNOSIS — N135 Crossing vessel and stricture of ureter without hydronephrosis: Secondary | ICD-10-CM | POA: Diagnosis not present

## 2019-09-20 DIAGNOSIS — Z436 Encounter for attention to other artificial openings of urinary tract: Secondary | ICD-10-CM | POA: Diagnosis present

## 2019-09-20 HISTORY — PX: IR NEPHROSTOMY EXCHANGE RIGHT: IMG6070

## 2019-09-20 MED ORDER — IOHEXOL 300 MG/ML  SOLN
50.0000 mL | Freq: Once | INTRAMUSCULAR | Status: AC | PRN
  Administered 2019-09-20: 10 mL

## 2019-09-20 NOTE — Procedures (Signed)
Interventional Radiology Procedure:   Indications: History of bladder cancer and right nephrostomy   Procedure: Nephrostomy tube exchange  Findings: Question filling defect in renal pelvis.  New 10 Fr drain placed.  Complications: None     EBL: less than 10 ml  Plan: Plan for routine exchange   Graeme Menees R. Anselm Pancoast, MD  Pager: [phone]

## 2019-09-29 ENCOUNTER — Ambulatory Visit (INDEPENDENT_AMBULATORY_CARE_PROVIDER_SITE_OTHER): Payer: Medicare HMO | Admitting: *Deleted

## 2019-09-29 DIAGNOSIS — R001 Bradycardia, unspecified: Secondary | ICD-10-CM

## 2019-09-29 LAB — CUP PACEART REMOTE DEVICE CHECK
Battery Impedance: 3289 Ohm
Battery Remaining Longevity: 19 mo
Battery Voltage: 2.72 V
Brady Statistic AP VP Percent: 62 %
Brady Statistic AP VS Percent: 34 %
Brady Statistic AS VP Percent: 2 %
Brady Statistic AS VS Percent: 1 %
Date Time Interrogation Session: 20210707104152
Implantable Lead Implant Date: 20090917
Implantable Lead Implant Date: 20090917
Implantable Lead Location: 753859
Implantable Lead Location: 753860
Implantable Lead Model: 5076
Implantable Lead Model: 5076
Implantable Pulse Generator Implant Date: 20090917
Lead Channel Impedance Value: 435 Ohm
Lead Channel Impedance Value: 560 Ohm
Lead Channel Pacing Threshold Amplitude: 0.375 V
Lead Channel Pacing Threshold Amplitude: 0.5 V
Lead Channel Pacing Threshold Pulse Width: 0.4 ms
Lead Channel Pacing Threshold Pulse Width: 0.4 ms
Lead Channel Setting Pacing Amplitude: 2 V
Lead Channel Setting Pacing Amplitude: 2.5 V
Lead Channel Setting Pacing Pulse Width: 0.4 ms
Lead Channel Setting Sensing Sensitivity: 2 mV

## 2019-10-01 NOTE — Progress Notes (Signed)
Remote pacemaker transmission.   

## 2019-10-18 IMAGING — XA IR EXCHANGE NEPHROSTOMY RIGHT
1 series · 2 of 2 positions shown · non-contrast
Comparison: none

CLINICAL DATA: Bladder carcinoma post ileal conduit creation and
ureteral stricture requiring nephrostomy drainage. Presents for
routine scheduled exchange.  No current complaints.

EXAM:
RIGHT PERCUTANEOUS NEPHROSTOMY CATHETER EXCHANGE UNDER FLUOROSCOPY
FLUOROSCOPY TIME:  42 SEC ; 8 MGY
TECHNIQUE: The procedure, risks (including but not limited to bleeding,
infection, organ damage ), benefits, and alternatives were explained
to the patient. Questions regarding the procedure were encouraged
and answered. The patient understands and consents to the procedure.
The nephrostomy tube and surrounding skin were prepped with
Betadine, draped in usual sterile fashion.

[Series 300: ir nephrostomy exchange right · 2 of 2 slices shown]
[im 1/2]
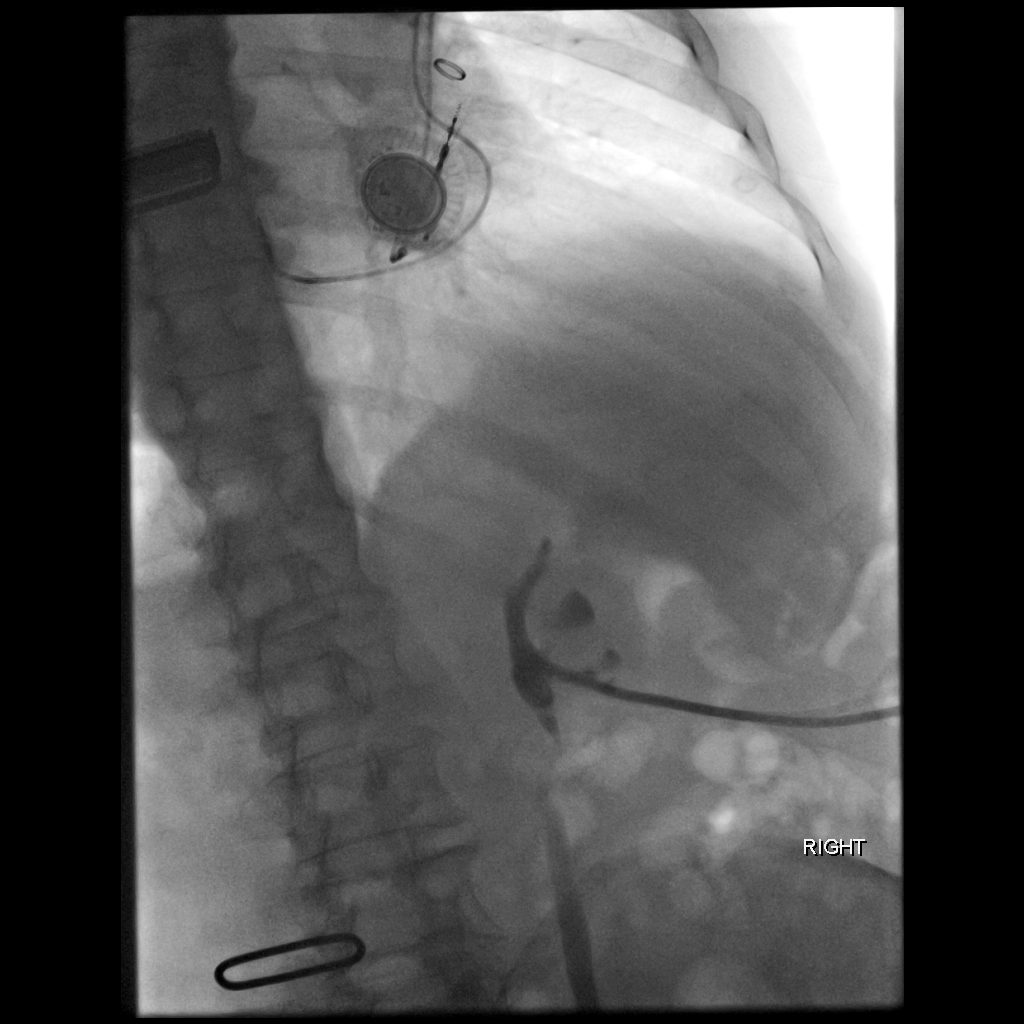
[im 2/2]
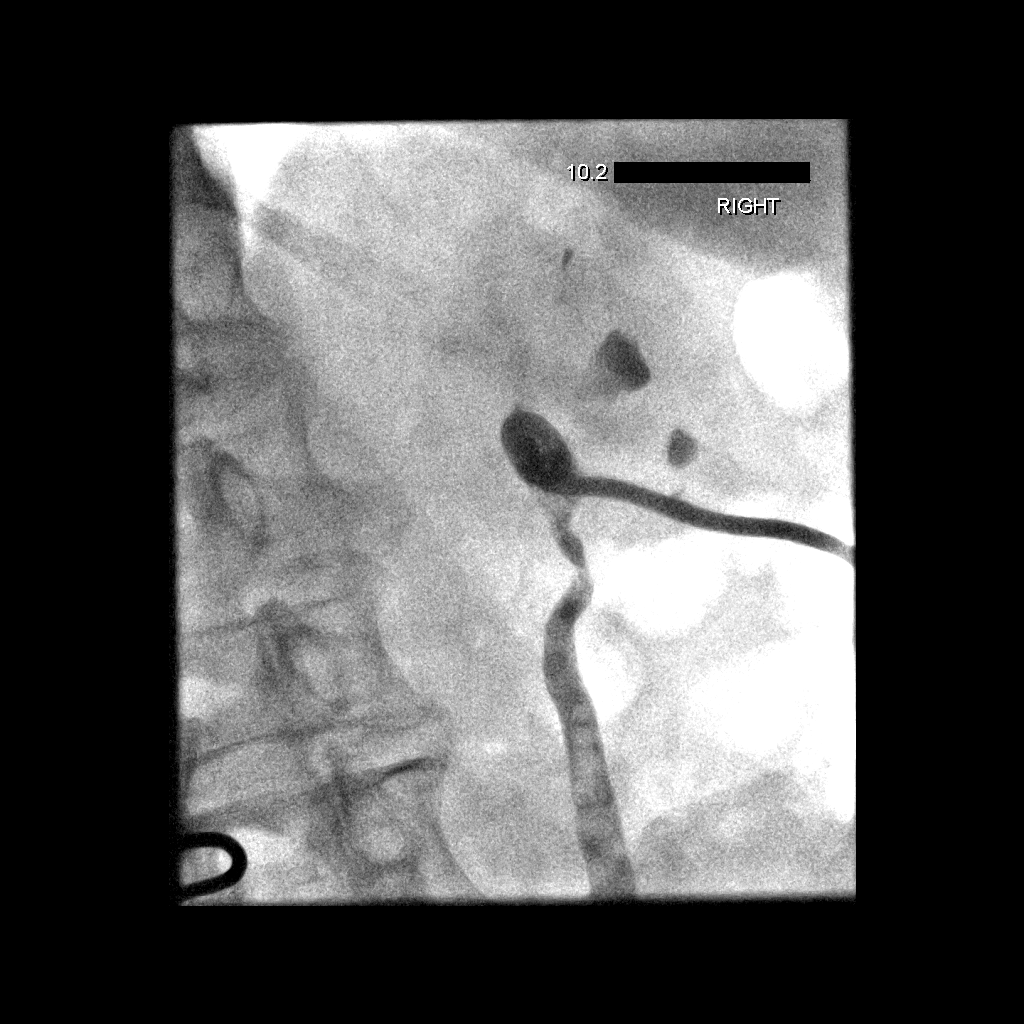

[2 of 2 positions shown; findings below may reference images not displayed]

A small amount of contrast was injected through the right
nephrostomy catheter to opacify the renal collecting system. The
catheter was cut and exchanged over a 0.035" angiographic wire for a
new 10-Fulgencio Lemoine pigtail catheter, formed centrally
within the collecting system under fluoroscopy. Contrast injection
confirms appropriate positioning.

Catheter secured externally with 0 Prolene suture and placed to
gravity drainage. The patient tolerated the procedure well.

COMPLICATIONS:
None immediate
IMPRESSION: 1. Technically successful exchange of 10 Lawrence Majawa Sheleng
nephrostomy catheter under fluoroscopy

## 2019-11-01 ENCOUNTER — Other Ambulatory Visit: Payer: Self-pay

## 2019-11-01 ENCOUNTER — Ambulatory Visit (HOSPITAL_COMMUNITY)
Admission: RE | Admit: 2019-11-01 | Discharge: 2019-11-01 | Disposition: A | Discharge status: Home / Self Care | Payer: No Typology Code available for payment source | Source: Ambulatory Visit | Attending: Diagnostic Radiology | Admitting: Diagnostic Radiology

## 2019-11-01 ENCOUNTER — Other Ambulatory Visit (HOSPITAL_COMMUNITY): Payer: Self-pay | Admitting: Diagnostic Radiology

## 2019-11-01 DIAGNOSIS — N135 Crossing vessel and stricture of ureter without hydronephrosis: Secondary | ICD-10-CM | POA: Insufficient documentation

## 2019-11-01 DIAGNOSIS — Z436 Encounter for attention to other artificial openings of urinary tract: Secondary | ICD-10-CM | POA: Insufficient documentation

## 2019-11-01 HISTORY — PX: IR NEPHROSTOMY EXCHANGE RIGHT: IMG6070

## 2019-11-01 MED ORDER — IOHEXOL 300 MG/ML  SOLN
50.0000 mL | Freq: Once | INTRAMUSCULAR | Status: AC | PRN
  Administered 2019-11-01: 15 mL

## 2019-11-01 NOTE — Procedures (Signed)
Interventional Radiology Procedure:   Indications: Bladder cancer and right ureter stricture with chronic right nephrostomy.  Procedure: Right nephrostomy tube exchange  Findings: New 10 Fr Bettey Mare catheter placed in right renal pelvis.  Complications: None     EBL: less than 10 ml     Plan: Plan for 6 week routine exchange.    [name] Rhine R. Anselm Pancoast, MD  Pager: [phone]

## 2019-11-03 ENCOUNTER — Other Ambulatory Visit: Payer: Self-pay

## 2019-11-03 ENCOUNTER — Encounter (INDEPENDENT_AMBULATORY_CARE_PROVIDER_SITE_OTHER): Admitting: Ophthalmology

## 2019-11-03 DIAGNOSIS — H43813 Vitreous degeneration, bilateral: Secondary | ICD-10-CM

## 2019-11-03 DIAGNOSIS — I10 Essential (primary) hypertension: Secondary | ICD-10-CM | POA: Diagnosis not present

## 2019-11-03 DIAGNOSIS — H35033 Hypertensive retinopathy, bilateral: Secondary | ICD-10-CM | POA: Diagnosis not present

## 2019-11-03 DIAGNOSIS — H353114 Nonexudative age-related macular degeneration, right eye, advanced atrophic with subfoveal involvement: Secondary | ICD-10-CM | POA: Diagnosis not present

## 2019-11-03 DIAGNOSIS — H353221 Exudative age-related macular degeneration, left eye, with active choroidal neovascularization: Secondary | ICD-10-CM

## 2019-11-29 IMAGING — XA IR EXCHANGE NEPHROSTOMY RIGHT
1 series · 3 of 3 positions shown · non-contrast
Comparison: none

CLINICAL DATA: Ureteral stricture, with long-term indwelling
nephrostomy catheter, presents for scheduled routine exchange

EXAM:
RIGHT PERCUTANEOUS NEPHROSTOMY CATHETER EXCHANGE UNDER FLUOROSCOPY
FLUOROSCOPY TIME:  0.5 minute; 30 u0ym9 DAP
TECHNIQUE: The procedure, risks (including but not limited to bleeding,
infection, organ damage ), benefits, and alternatives were explained
to the patient. Questions regarding the procedure were encouraged
and answered. The patient understands and consents to the procedure.
The nephrostomy tube and surrounding skin were prepped with
Betadine, draped in usual sterile fashion.

[Series 300: ir nephrostomy exchange right · 3 of 3 slices shown]
[im 1/3]
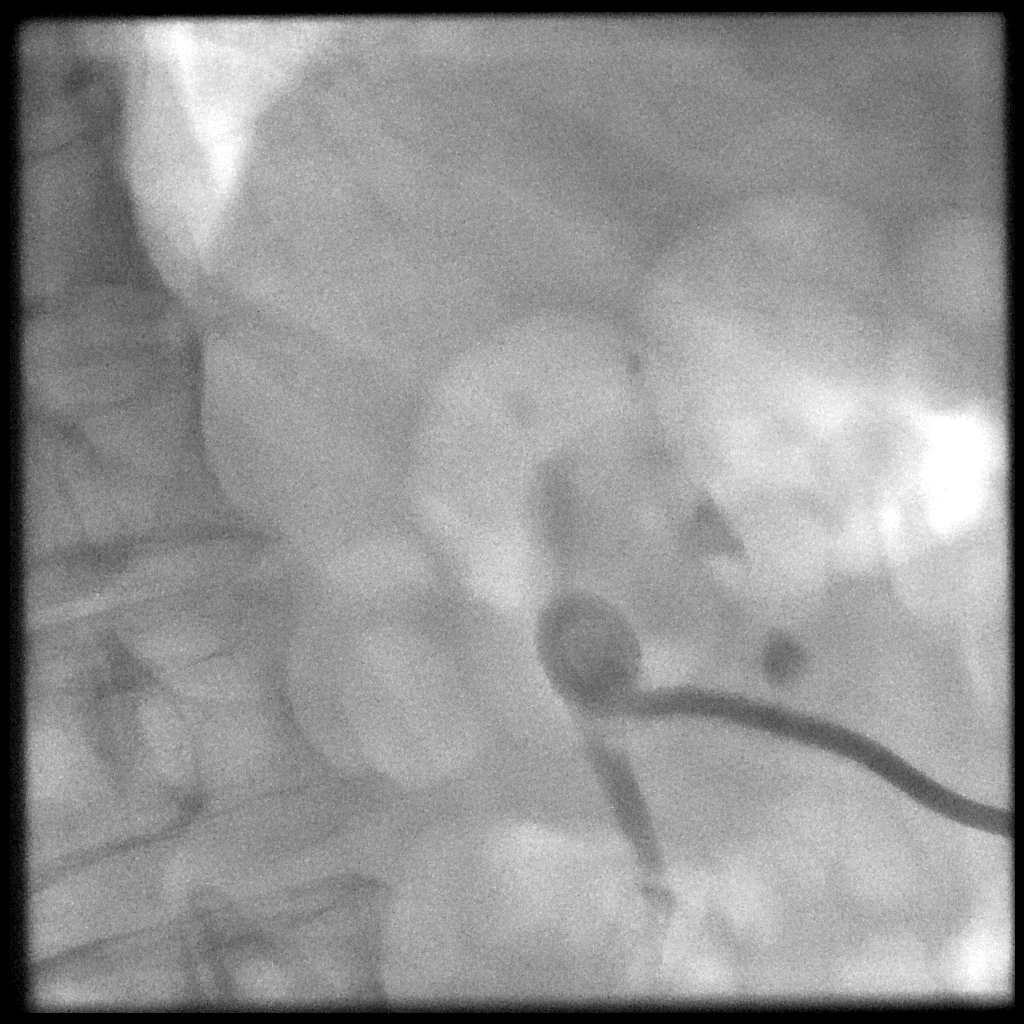
[im 2/3]
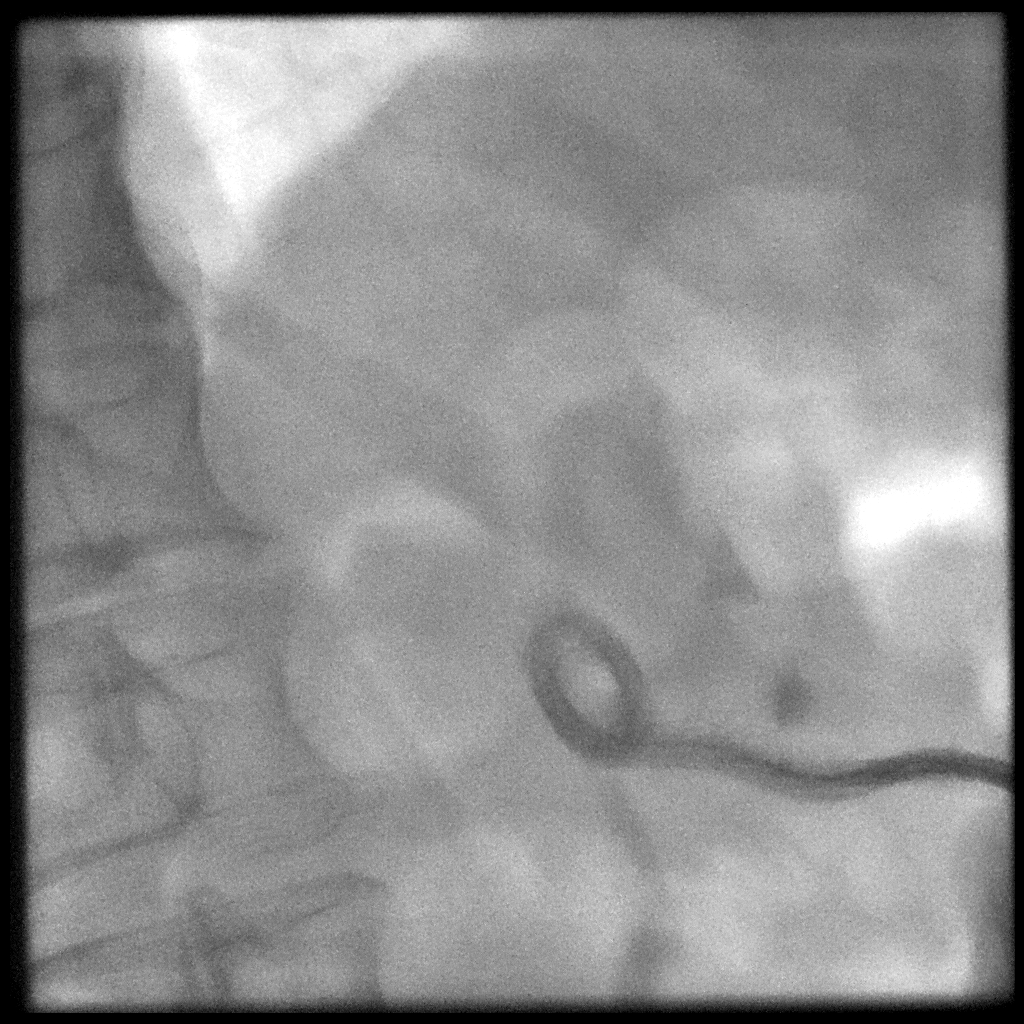
[im 3/3]
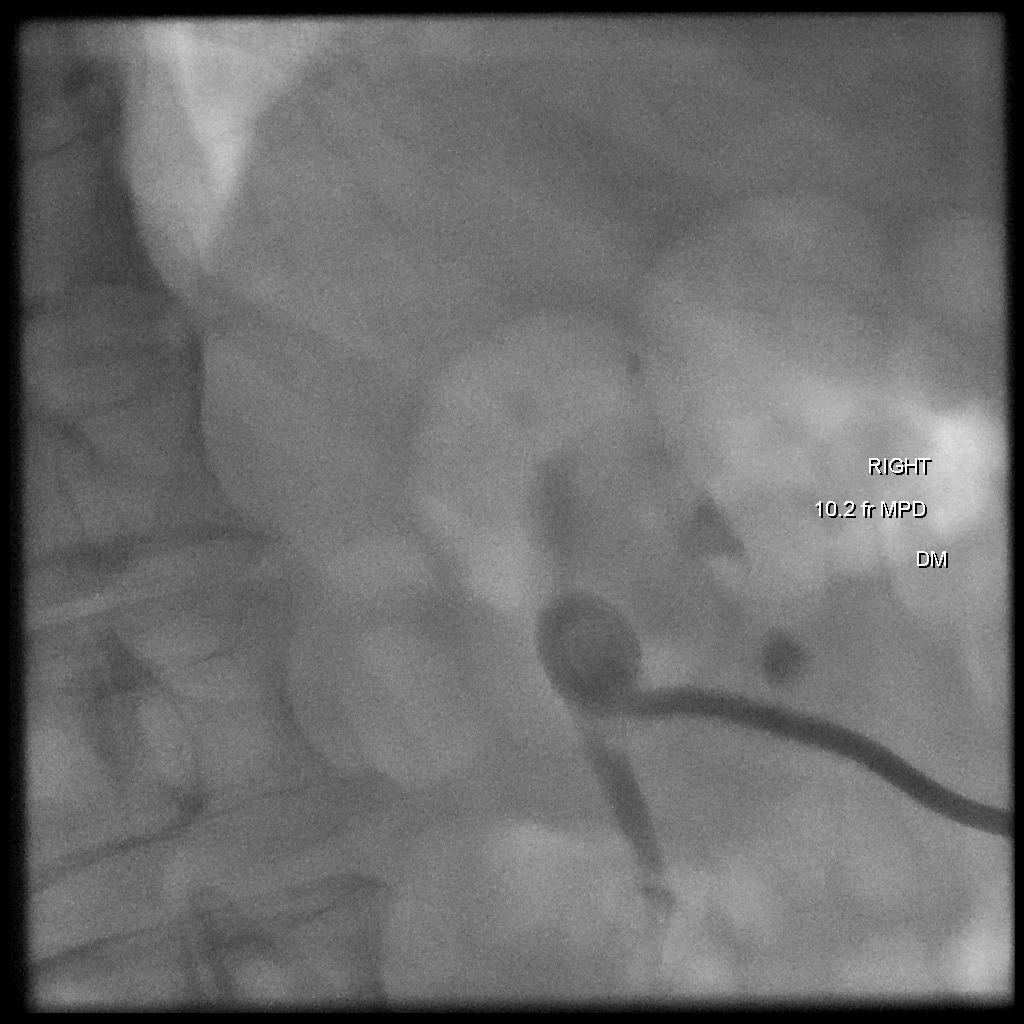

[3 of 3 positions shown; findings below may reference images not displayed]

A small amount of contrast was injected through the right
nephrostomy catheter to opacify the renal collecting system. The
catheter was cut and exchanged over a 0.035" angiographic wire for a
new 10-Halderg Joffre Rimassa Naranjo pigtail catheter, formed centrally
within the collecting system under fluoroscopy. Contrast injection
confirms appropriate positioning.

Catheter secured externally with 0 Prolene suture and placed to
gravity drainage. The patient tolerated the procedure well.

COMPLICATIONS:
None immediate
IMPRESSION: 1. Technically successful exchange of right 10 Demond Shakya
nephrostomy catheter under fluoroscopy

## 2019-12-04 DIAGNOSIS — I5033 Acute on chronic diastolic (congestive) heart failure: Secondary | ICD-10-CM | POA: Diagnosis not present

## 2019-12-04 DIAGNOSIS — C679 Malignant neoplasm of bladder, unspecified: Secondary | ICD-10-CM | POA: Diagnosis not present

## 2019-12-04 DIAGNOSIS — I350 Nonrheumatic aortic (valve) stenosis: Secondary | ICD-10-CM | POA: Diagnosis not present

## 2019-12-04 DIAGNOSIS — D631 Anemia in chronic kidney disease: Secondary | ICD-10-CM | POA: Diagnosis not present

## 2019-12-04 DIAGNOSIS — N139 Obstructive and reflux uropathy, unspecified: Secondary | ICD-10-CM | POA: Diagnosis not present

## 2019-12-04 DIAGNOSIS — N184 Chronic kidney disease, stage 4 (severe): Secondary | ICD-10-CM | POA: Diagnosis not present

## 2019-12-04 DIAGNOSIS — K259 Gastric ulcer, unspecified as acute or chronic, without hemorrhage or perforation: Secondary | ICD-10-CM | POA: Diagnosis not present

## 2019-12-04 DIAGNOSIS — K8020 Calculus of gallbladder without cholecystitis without obstruction: Secondary | ICD-10-CM | POA: Diagnosis not present

## 2019-12-04 DIAGNOSIS — I130 Hypertensive heart and chronic kidney disease with heart failure and stage 1 through stage 4 chronic kidney disease, or unspecified chronic kidney disease: Secondary | ICD-10-CM | POA: Diagnosis not present

## 2019-12-04 DIAGNOSIS — N200 Calculus of kidney: Secondary | ICD-10-CM | POA: Diagnosis not present

## 2019-12-13 ENCOUNTER — Other Ambulatory Visit (HOSPITAL_COMMUNITY): Payer: Self-pay | Admitting: Interventional Radiology

## 2019-12-13 ENCOUNTER — Ambulatory Visit (HOSPITAL_COMMUNITY)
Admission: RE | Admit: 2019-12-13 | Discharge: 2019-12-13 | Disposition: A | Discharge status: Home / Self Care | Payer: No Typology Code available for payment source | Source: Ambulatory Visit | Attending: Diagnostic Radiology | Admitting: Diagnostic Radiology

## 2019-12-13 ENCOUNTER — Other Ambulatory Visit: Payer: Self-pay

## 2019-12-13 DIAGNOSIS — Z436 Encounter for attention to other artificial openings of urinary tract: Secondary | ICD-10-CM | POA: Insufficient documentation

## 2019-12-13 DIAGNOSIS — N135 Crossing vessel and stricture of ureter without hydronephrosis: Secondary | ICD-10-CM

## 2019-12-13 HISTORY — PX: IR NEPHROSTOMY EXCHANGE RIGHT: IMG6070

## 2019-12-13 MED ORDER — IOHEXOL 300 MG/ML  SOLN
50.0000 mL | Freq: Once | INTRAMUSCULAR | Status: AC | PRN
  Administered 2019-12-13: 7 mL

## 2019-12-29 ENCOUNTER — Ambulatory Visit (INDEPENDENT_AMBULATORY_CARE_PROVIDER_SITE_OTHER): Payer: No Typology Code available for payment source

## 2019-12-29 DIAGNOSIS — R001 Bradycardia, unspecified: Secondary | ICD-10-CM

## 2019-12-30 LAB — CUP PACEART REMOTE DEVICE CHECK
Battery Impedance: 3667 Ohm
Battery Remaining Longevity: 16 mo
Battery Voltage: 2.71 V
Brady Statistic AP VP Percent: 65 %
Brady Statistic AP VS Percent: 32 %
Brady Statistic AS VP Percent: 2 %
Brady Statistic AS VS Percent: 1 %
Date Time Interrogation Session: 20211006084132
Implantable Lead Implant Date: 20090917
Implantable Lead Implant Date: 20090917
Implantable Lead Location: 753859
Implantable Lead Location: 753860
Implantable Lead Model: 5076
Implantable Lead Model: 5076
Implantable Pulse Generator Implant Date: 20090917
Lead Channel Impedance Value: 427 Ohm
Lead Channel Impedance Value: 614 Ohm
Lead Channel Pacing Threshold Amplitude: 0.375 V
Lead Channel Pacing Threshold Amplitude: 0.5 V
Lead Channel Pacing Threshold Pulse Width: 0.4 ms
Lead Channel Pacing Threshold Pulse Width: 0.4 ms
Lead Channel Setting Pacing Amplitude: 2 V
Lead Channel Setting Pacing Amplitude: 2.5 V
Lead Channel Setting Pacing Pulse Width: 0.4 ms
Lead Channel Setting Sensing Sensitivity: 2 mV

## 2020-01-03 DIAGNOSIS — R69 Illness, unspecified: Secondary | ICD-10-CM | POA: Diagnosis not present

## 2020-01-03 NOTE — Progress Notes (Signed)
Remote pacemaker transmission.   

## 2020-01-10 DIAGNOSIS — M109 Gout, unspecified: Secondary | ICD-10-CM | POA: Diagnosis not present

## 2020-01-10 DIAGNOSIS — Z125 Encounter for screening for malignant neoplasm of prostate: Secondary | ICD-10-CM | POA: Diagnosis not present

## 2020-01-10 DIAGNOSIS — E039 Hypothyroidism, unspecified: Secondary | ICD-10-CM | POA: Diagnosis not present

## 2020-01-10 DIAGNOSIS — E785 Hyperlipidemia, unspecified: Secondary | ICD-10-CM | POA: Diagnosis not present

## 2020-01-17 DIAGNOSIS — E875 Hyperkalemia: Secondary | ICD-10-CM | POA: Diagnosis not present

## 2020-01-17 DIAGNOSIS — Z950 Presence of cardiac pacemaker: Secondary | ICD-10-CM | POA: Diagnosis not present

## 2020-01-17 DIAGNOSIS — I8290 Acute embolism and thrombosis of unspecified vein: Secondary | ICD-10-CM | POA: Diagnosis not present

## 2020-01-17 DIAGNOSIS — R69 Illness, unspecified: Secondary | ICD-10-CM | POA: Diagnosis not present

## 2020-01-17 DIAGNOSIS — Z0000 Encounter for general adult medical examination without abnormal findings: Secondary | ICD-10-CM | POA: Diagnosis not present

## 2020-01-17 DIAGNOSIS — M8580 Other specified disorders of bone density and structure, unspecified site: Secondary | ICD-10-CM | POA: Diagnosis not present

## 2020-01-17 DIAGNOSIS — E039 Hypothyroidism, unspecified: Secondary | ICD-10-CM | POA: Diagnosis not present

## 2020-01-17 DIAGNOSIS — I509 Heart failure, unspecified: Secondary | ICD-10-CM | POA: Diagnosis not present

## 2020-01-17 DIAGNOSIS — N184 Chronic kidney disease, stage 4 (severe): Secondary | ICD-10-CM | POA: Diagnosis not present

## 2020-01-17 DIAGNOSIS — I129 Hypertensive chronic kidney disease with stage 1 through stage 4 chronic kidney disease, or unspecified chronic kidney disease: Secondary | ICD-10-CM | POA: Diagnosis not present

## 2020-01-24 ENCOUNTER — Other Ambulatory Visit (HOSPITAL_COMMUNITY): Payer: No Typology Code available for payment source

## 2020-01-26 ENCOUNTER — Ambulatory Visit (HOSPITAL_COMMUNITY)
Admission: RE | Admit: 2020-01-26 | Discharge: 2020-01-26 | Disposition: A | Discharge status: Home / Self Care | Payer: No Typology Code available for payment source | Source: Ambulatory Visit | Attending: Interventional Radiology | Admitting: Interventional Radiology

## 2020-01-26 ENCOUNTER — Other Ambulatory Visit: Payer: Self-pay

## 2020-01-26 ENCOUNTER — Encounter (INDEPENDENT_AMBULATORY_CARE_PROVIDER_SITE_OTHER): Payer: Medicare HMO | Admitting: Ophthalmology

## 2020-01-26 ENCOUNTER — Other Ambulatory Visit (HOSPITAL_COMMUNITY): Payer: Self-pay | Admitting: Interventional Radiology

## 2020-01-26 DIAGNOSIS — Z436 Encounter for attention to other artificial openings of urinary tract: Secondary | ICD-10-CM | POA: Insufficient documentation

## 2020-01-26 DIAGNOSIS — N1330 Unspecified hydronephrosis: Secondary | ICD-10-CM | POA: Diagnosis not present

## 2020-01-26 DIAGNOSIS — N135 Crossing vessel and stricture of ureter without hydronephrosis: Secondary | ICD-10-CM

## 2020-01-26 HISTORY — PX: IR NEPHROSTOMY EXCHANGE RIGHT: IMG6070

## 2020-01-26 MED ORDER — LIDOCAINE HCL 1 % IJ SOLN
INTRAMUSCULAR | Status: AC
  Filled 2020-01-26: qty 20

## 2020-01-26 MED ORDER — IOHEXOL 300 MG/ML  SOLN
50.0000 mL | Freq: Once | INTRAMUSCULAR | Status: AC | PRN
  Administered 2020-01-26: 10 mL

## 2020-01-26 NOTE — Procedures (Signed)
Pre Procedure Dx: Hydronephrosis Post Procedure Dx: Same  Successful right sided PCN exchange.    EBL: None   No immediate complications.   Jay Khani Paino, MD Pager #: [phone]   

## 2020-02-02 DIAGNOSIS — K8020 Calculus of gallbladder without cholecystitis without obstruction: Secondary | ICD-10-CM | POA: Diagnosis not present

## 2020-02-02 DIAGNOSIS — N184 Chronic kidney disease, stage 4 (severe): Secondary | ICD-10-CM | POA: Diagnosis not present

## 2020-02-02 DIAGNOSIS — I350 Nonrheumatic aortic (valve) stenosis: Secondary | ICD-10-CM | POA: Diagnosis not present

## 2020-02-02 DIAGNOSIS — N139 Obstructive and reflux uropathy, unspecified: Secondary | ICD-10-CM | POA: Diagnosis not present

## 2020-02-02 DIAGNOSIS — C679 Malignant neoplasm of bladder, unspecified: Secondary | ICD-10-CM | POA: Diagnosis not present

## 2020-02-02 DIAGNOSIS — K259 Gastric ulcer, unspecified as acute or chronic, without hemorrhage or perforation: Secondary | ICD-10-CM | POA: Diagnosis not present

## 2020-02-02 DIAGNOSIS — D631 Anemia in chronic kidney disease: Secondary | ICD-10-CM | POA: Diagnosis not present

## 2020-02-02 DIAGNOSIS — N200 Calculus of kidney: Secondary | ICD-10-CM | POA: Diagnosis not present

## 2020-02-02 DIAGNOSIS — I5033 Acute on chronic diastolic (congestive) heart failure: Secondary | ICD-10-CM | POA: Diagnosis not present

## 2020-02-02 DIAGNOSIS — I130 Hypertensive heart and chronic kidney disease with heart failure and stage 1 through stage 4 chronic kidney disease, or unspecified chronic kidney disease: Secondary | ICD-10-CM | POA: Diagnosis not present

## 2020-02-20 IMAGING — XA IR EXCHANGE NEPHROSTOMY RIGHT
1 series · 4 of 4 positions shown · non-contrast
Comparison: Multiple previous fluoroscopic guided nephrostomy
catheter exchanges most recently on 03/23/2018

INDICATION: Routine exchange.

EXAM:
FLUOROSCOPIC GUIDED RIGHT SIDED NEPHROSTOMY CATHETER EXCHANGE
TECHNIQUE: Informed written consent was obtained from the patient after a
discussion of the risks, benefits and alternatives to treatment.
Questions regarding the procedure were encouraged and answered. A
timeout was performed prior to the initiation of the procedure.

[Series 300: tube placements · 4 of 4 slices shown]
[im 1/4]
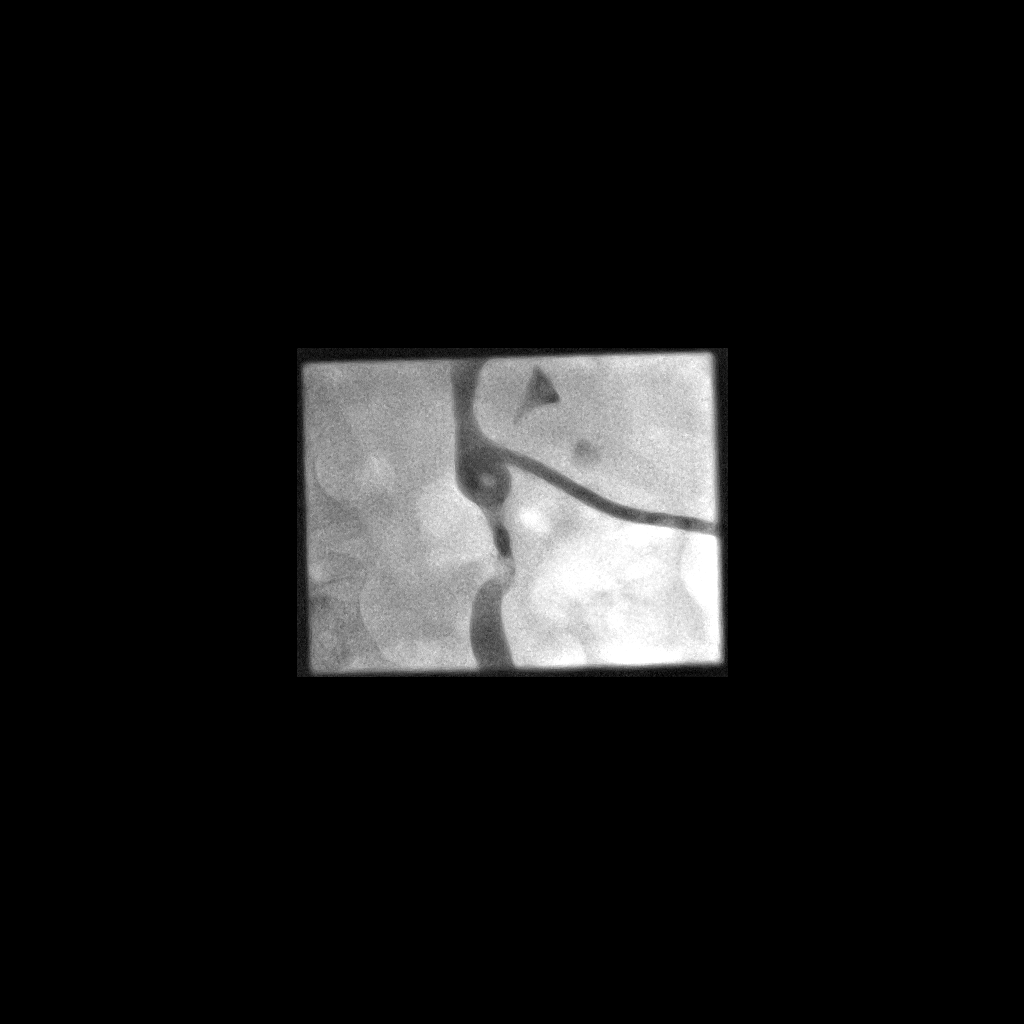
[im 2/4]
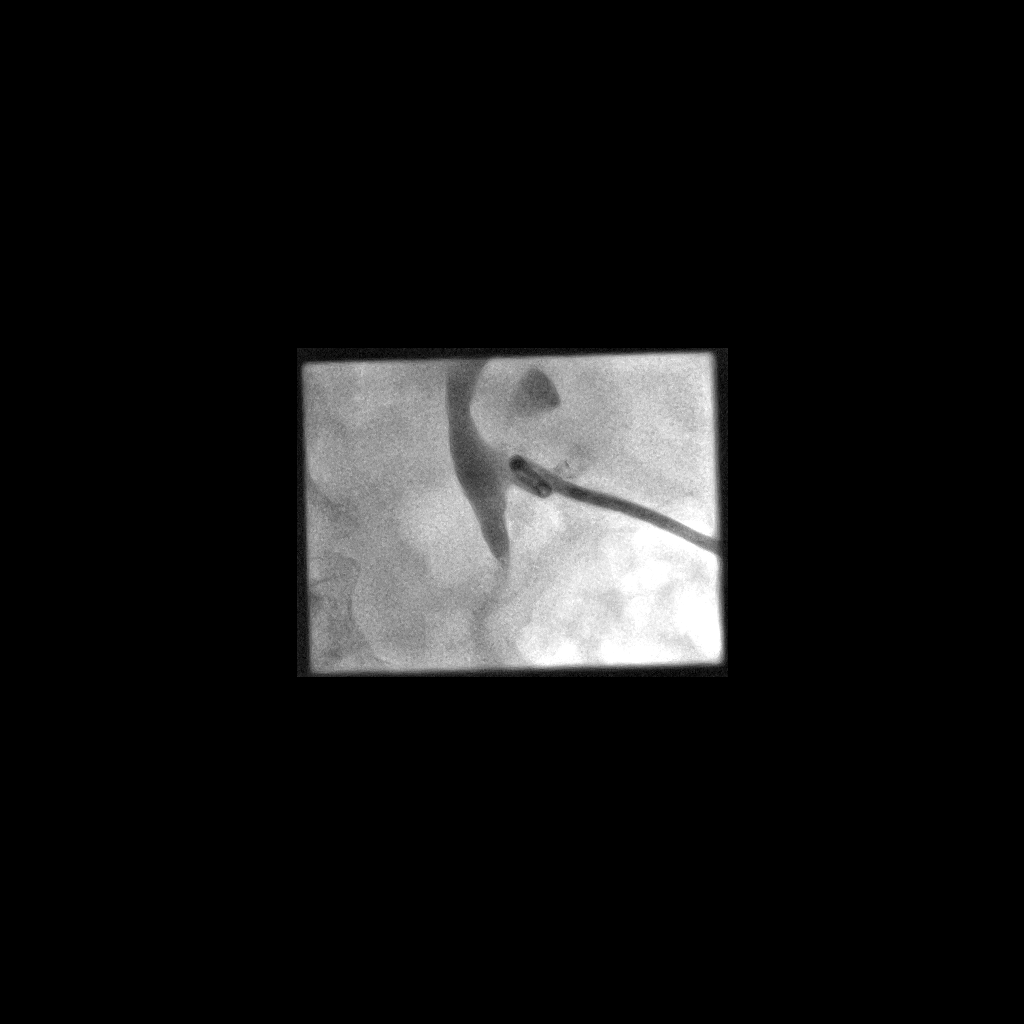
[im 3/4]
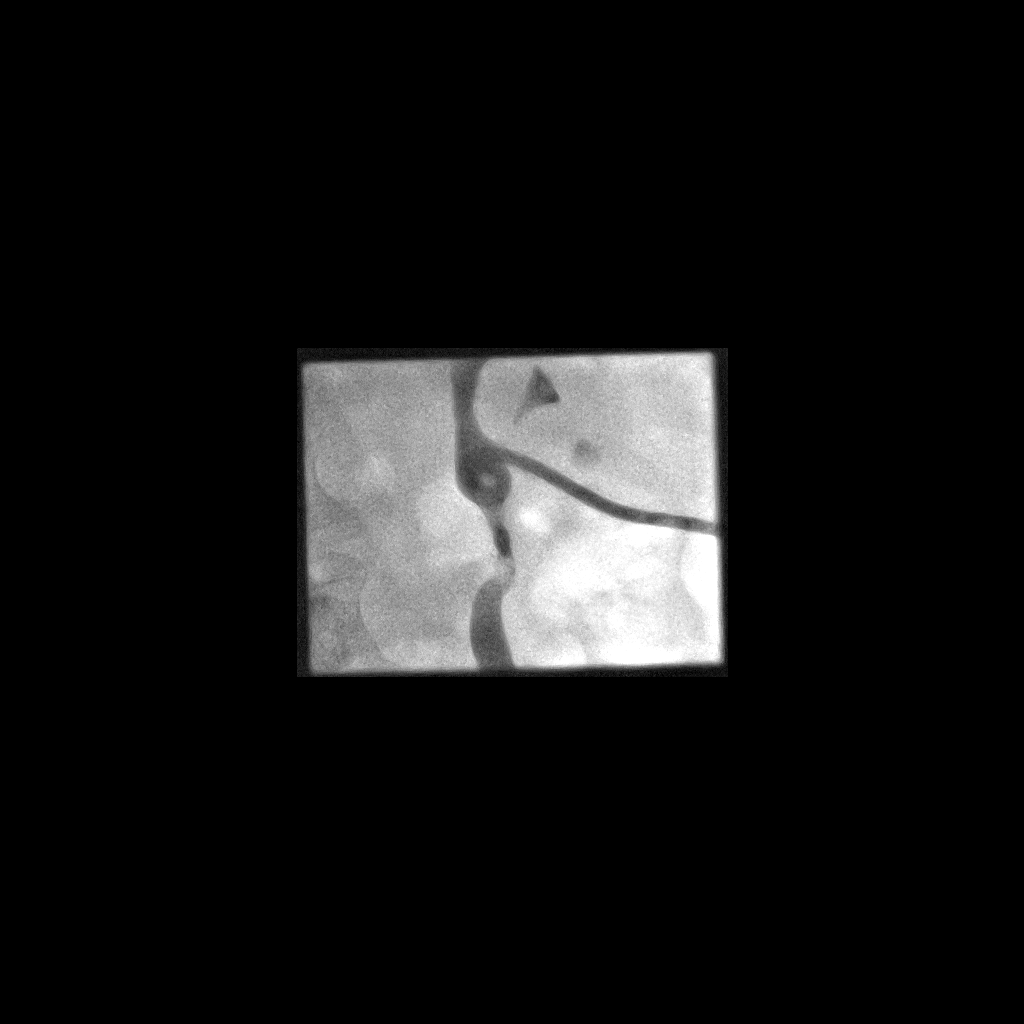
[im 4/4]
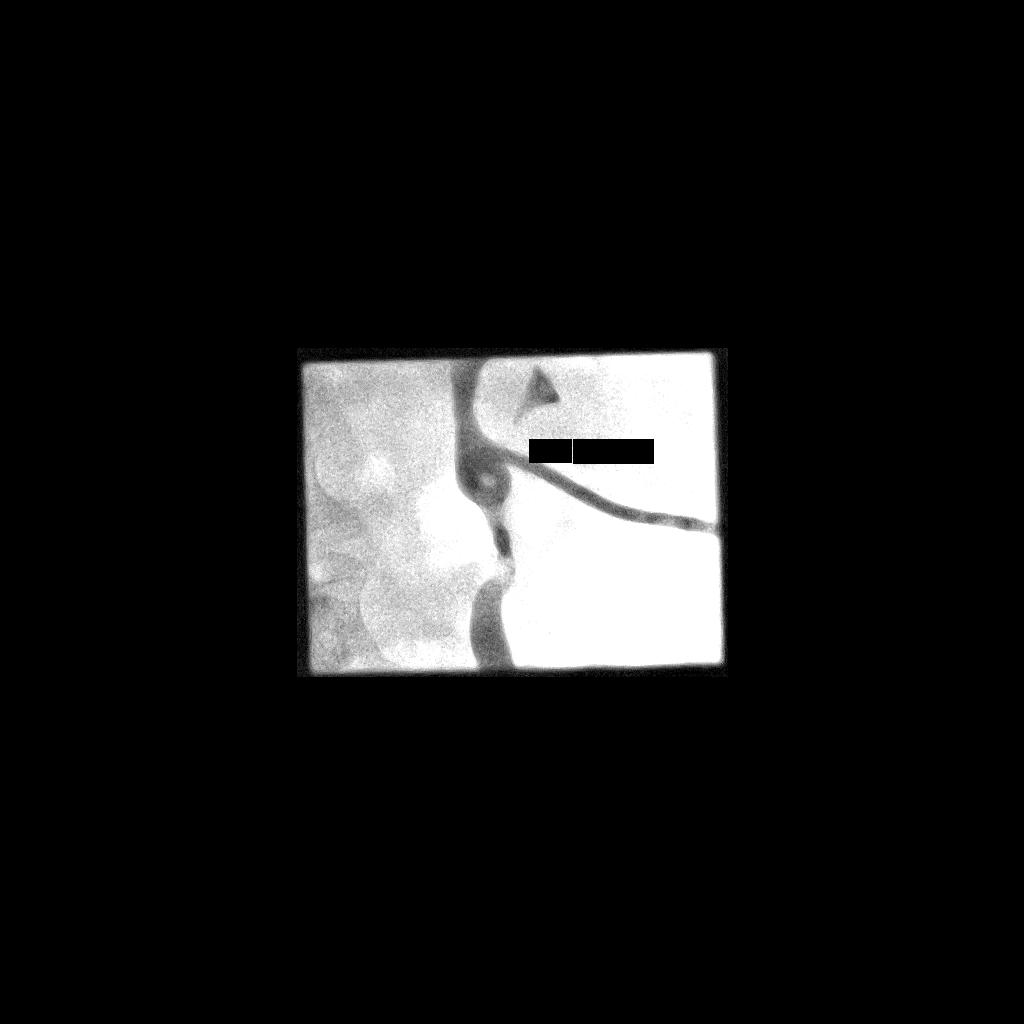

[4 of 4 positions shown; findings below may reference images not displayed]

CONTRAST:  10 mL Wsovue-LCC administered into the collecting system

FLUOROSCOPY TIME:  54 seconds (8 mGy)

COMPLICATIONS:
None immediate.
The right flank and external portion of existing nephrostomy
catheter were prepped and draped in the usual sterile fashion. A
sterile drape was applied covering the operative field. Maximum
barrier sterile technique with sterile gowns and gloves were used
for the procedure. A timeout was performed prior to the initiation
of the procedure.

A pre procedural spot fluoroscopic image was obtained after contrast
was injected via the existing nephrostomy catheter demonstrating
appropriate positioning within the renal pelvis. The existing
nephrostomy catheter was cut and cannulated with a Benson wire which
was coiled within the renal pelvis. Under intermittent fluoroscopic
guidance, the existing nephrostomy catheter was exchanged for a new
10.2 Nuvia Gebhart drainage catheter. Contrast injection
confirmed appropriate positioning within the renal pelvis and a post
exchange fluoroscopic image was obtained. The catheter was locked. A
dressing was placed. The patient tolerated the procedure well
without immediate postprocedural complication.
FINDINGS: The existing nephrostomy catheter is appropriately positioned and
functioning.

After successful fluoroscopic guided exchange, the new nephrostomy
catheter is coiled and locked within the right renal pelvis.
IMPRESSION: Successful fluoroscopic guided exchange of right sided 10.2 Jarod
Paulus N Ceejay nephrostomy catheter.

## 2020-03-08 ENCOUNTER — Other Ambulatory Visit (HOSPITAL_COMMUNITY): Payer: No Typology Code available for payment source

## 2020-03-10 ENCOUNTER — Other Ambulatory Visit: Payer: Self-pay

## 2020-03-10 ENCOUNTER — Ambulatory Visit (HOSPITAL_COMMUNITY)
Admission: RE | Admit: 2020-03-10 | Discharge: 2020-03-10 | Disposition: A | Discharge status: Home / Self Care | Payer: No Typology Code available for payment source | Source: Ambulatory Visit | Attending: Interventional Radiology | Admitting: Interventional Radiology

## 2020-03-10 DIAGNOSIS — Z436 Encounter for attention to other artificial openings of urinary tract: Secondary | ICD-10-CM | POA: Diagnosis not present

## 2020-03-10 DIAGNOSIS — N135 Crossing vessel and stricture of ureter without hydronephrosis: Secondary | ICD-10-CM | POA: Insufficient documentation

## 2020-03-10 HISTORY — PX: IR NEPHROSTOMY EXCHANGE RIGHT: IMG6070

## 2020-03-10 MED ORDER — LIDOCAINE HCL (PF) 1 % IJ SOLN
INTRAMUSCULAR | Status: AC
  Filled 2020-03-10: qty 30

## 2020-03-10 MED ORDER — IOHEXOL 300 MG/ML  SOLN
50.0000 mL | Freq: Once | INTRAMUSCULAR | Status: AC | PRN
  Administered 2020-03-10: 15 mL

## 2020-03-13 ENCOUNTER — Other Ambulatory Visit (HOSPITAL_COMMUNITY): Payer: Self-pay | Admitting: Interventional Radiology

## 2020-03-13 DIAGNOSIS — N135 Crossing vessel and stricture of ureter without hydronephrosis: Secondary | ICD-10-CM

## 2020-03-29 ENCOUNTER — Ambulatory Visit (INDEPENDENT_AMBULATORY_CARE_PROVIDER_SITE_OTHER): Payer: No Typology Code available for payment source

## 2020-03-29 DIAGNOSIS — R001 Bradycardia, unspecified: Secondary | ICD-10-CM | POA: Diagnosis not present

## 2020-03-29 LAB — CUP PACEART REMOTE DEVICE CHECK
Battery Impedance: 4199 Ohm
Battery Remaining Longevity: 12 mo
Battery Voltage: 2.68 V
Brady Statistic AP VP Percent: 64 %
Brady Statistic AP VS Percent: 34 %
Brady Statistic AS VP Percent: 1 %
Brady Statistic AS VS Percent: 1 %
Date Time Interrogation Session: 20220105112909
Implantable Lead Implant Date: 20090917
Implantable Lead Implant Date: 20090917
Implantable Lead Location: 753859
Implantable Lead Location: 753860
Implantable Lead Model: 5076
Implantable Lead Model: 5076
Implantable Pulse Generator Implant Date: 20090917
Lead Channel Impedance Value: 440 Ohm
Lead Channel Impedance Value: 530 Ohm
Lead Channel Pacing Threshold Amplitude: 0.375 V
Lead Channel Pacing Threshold Amplitude: 0.5 V
Lead Channel Pacing Threshold Pulse Width: 0.4 ms
Lead Channel Pacing Threshold Pulse Width: 0.4 ms
Lead Channel Setting Pacing Amplitude: 2 V
Lead Channel Setting Pacing Amplitude: 2.5 V
Lead Channel Setting Pacing Pulse Width: 0.4 ms
Lead Channel Setting Sensing Sensitivity: 2 mV

## 2020-04-12 IMAGING — XA IR EXCHANGE NEPHROSTOMY RIGHT
1 series · 4 of 4 positions shown · non-contrast
Comparison: 05/04/2018

INDICATION: [AGE] male with indwelling right percutaneous nephrostomy
presents for routine exchange

EXAM:
IR EXCHANGE NEPHROSTOMY RIGHT

[Series 300: ir nephrostomy exchange right · 4 of 4 slices shown]
[im 1/4]
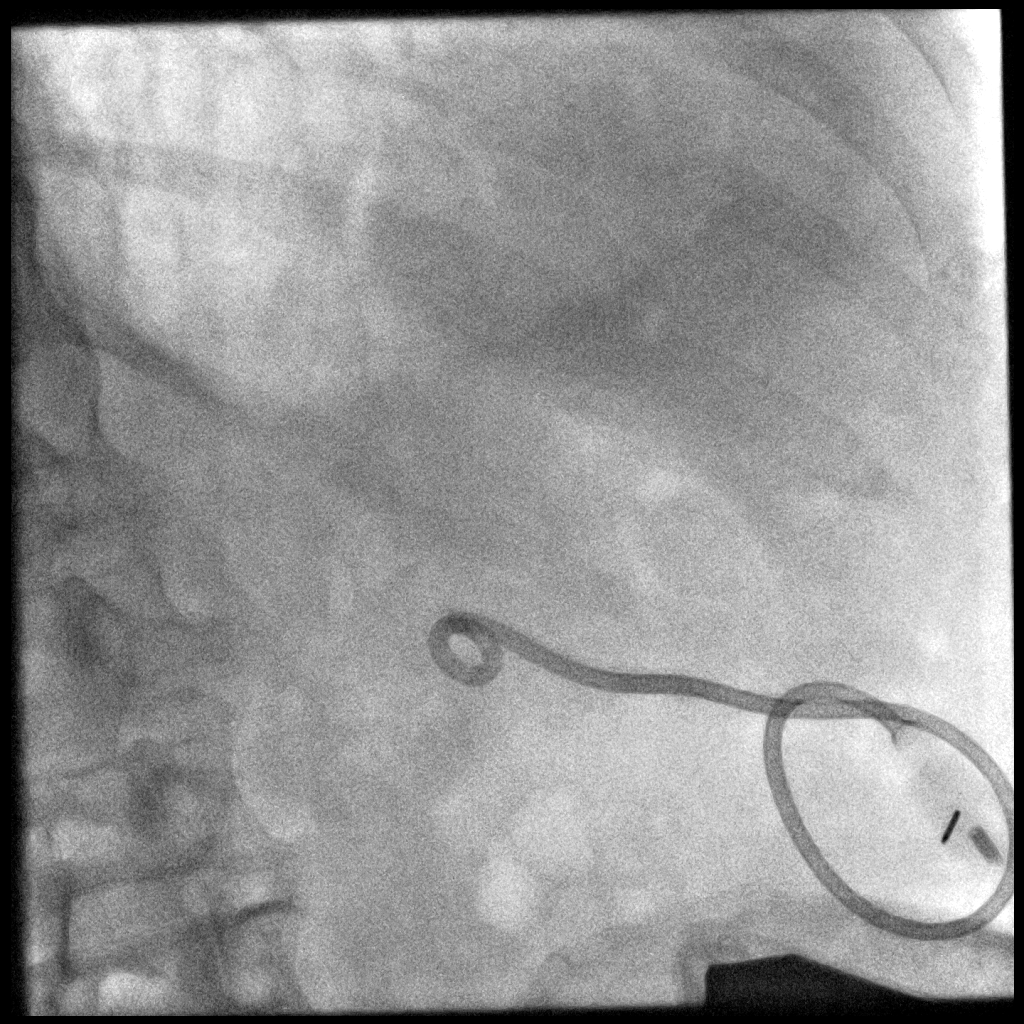
[im 2/4]
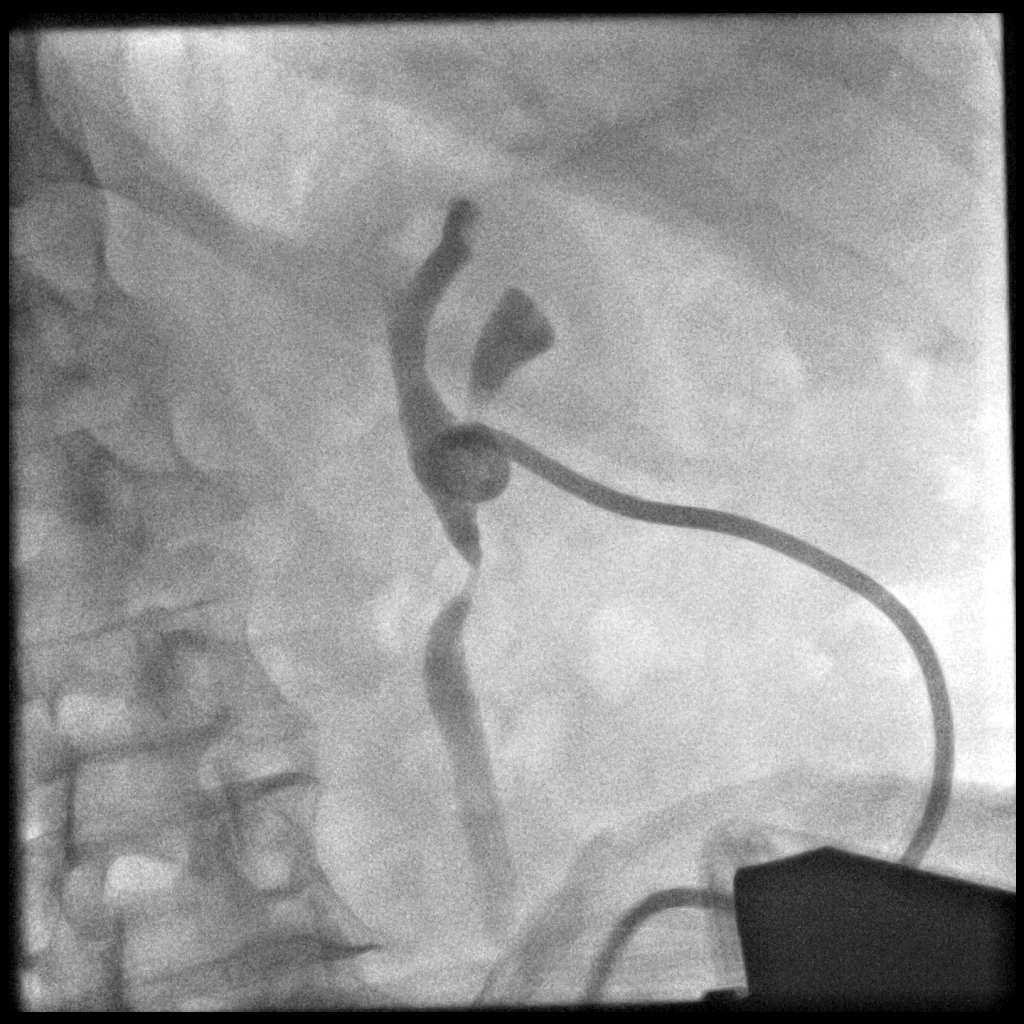
[im 3/4]
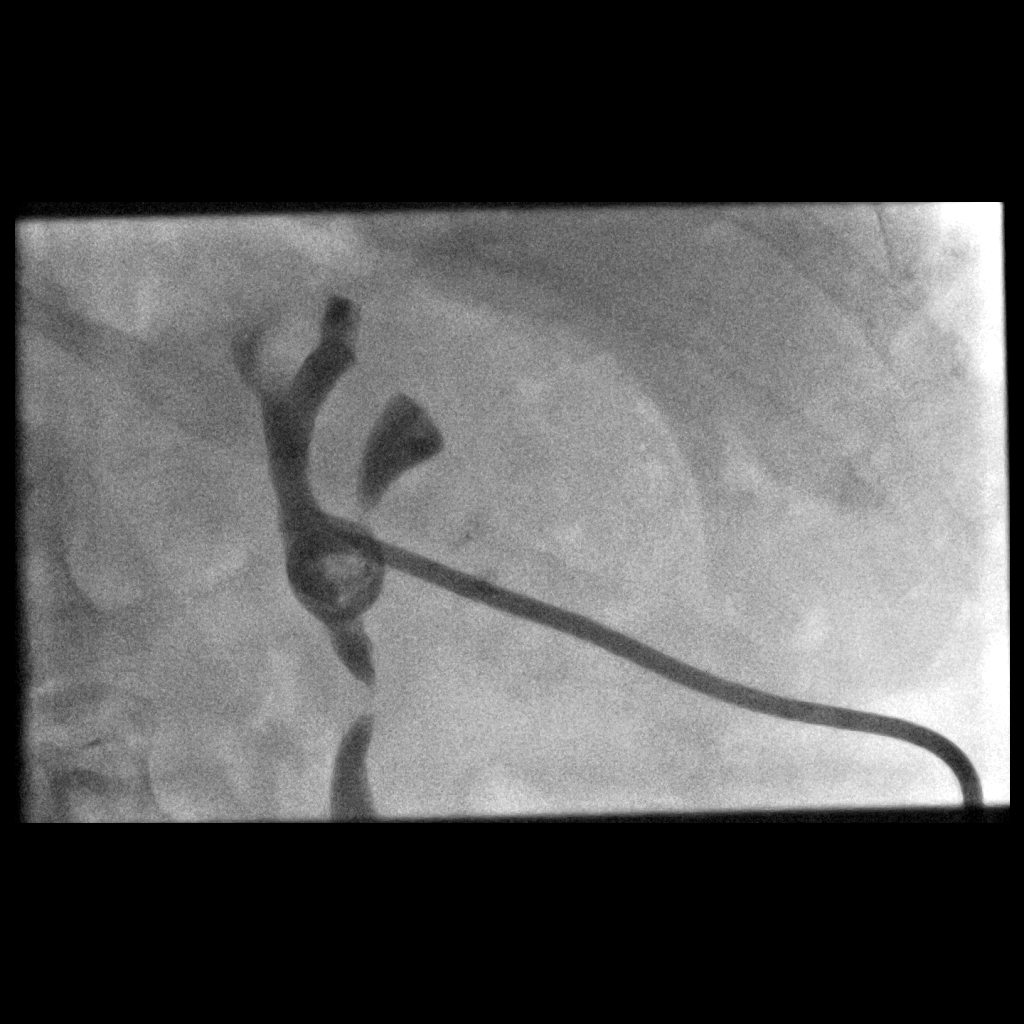
[im 4/4]
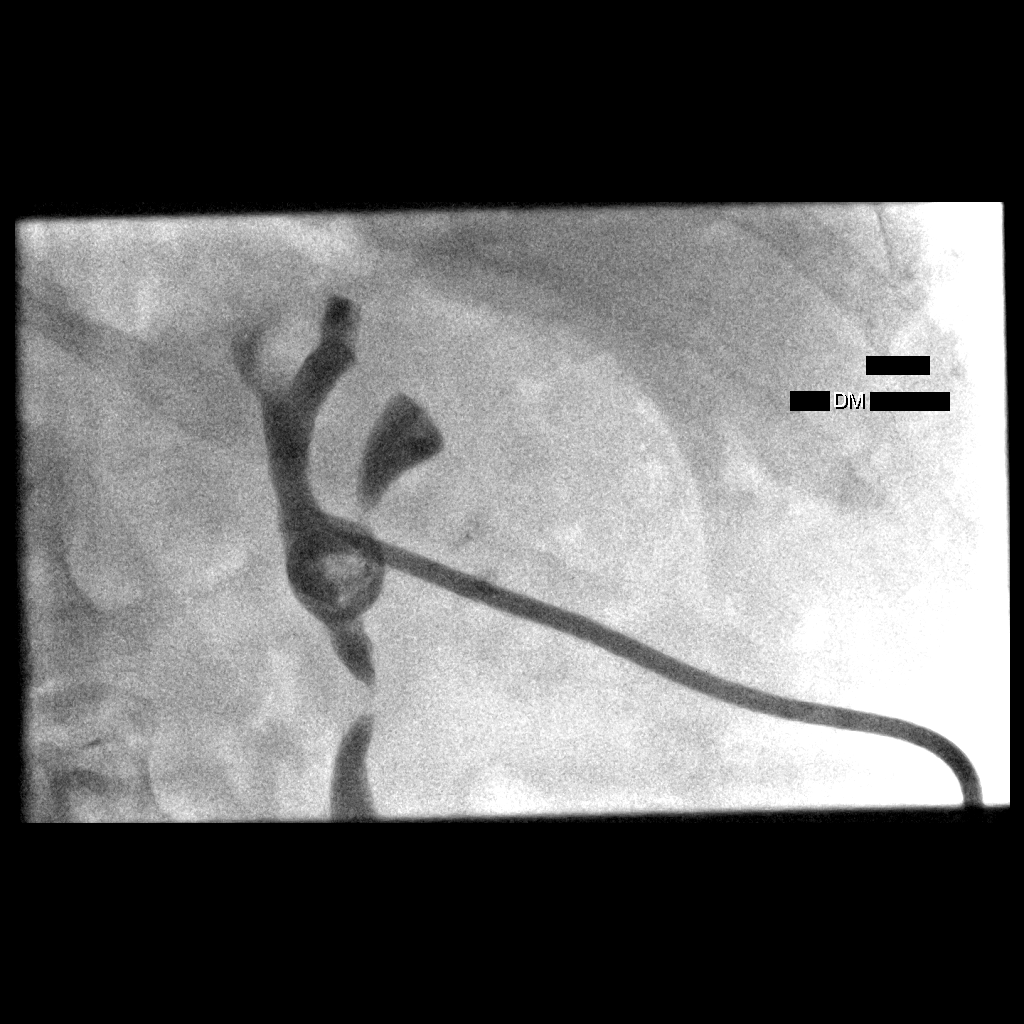

[4 of 4 positions shown; findings below may reference images not displayed]

MEDICATIONS:
None

ANESTHESIA/SEDATION:
None

CONTRAST:  10 cc-administered into the collecting system(s)

FLUOROSCOPY TIME:  Fluoroscopy Time: 1 minutes 36 seconds (622 mGy).

COMPLICATIONS:
None

PROCEDURE:
Informed written consent was obtained from the patient after a
thorough discussion of the procedural risks, benefits and
alternatives. All questions were addressed. Maximal Sterile Barrier
Technique was utilized including caps, mask, sterile gowns, sterile
gloves, sterile drape, hand hygiene and skin antiseptic. A timeout
was performed prior to the initiation of the procedure.

Patient position prone on the fluoroscopy table. The right flank and
indwelling drain prepped and draped in the usual sterile fashion.

Contrast was infused confirming location within the collecting
system.

Modified Seldinger technique was then used to exchange for a new 10
French catheter. Initially, the 035 Amplatz wire would not unfurl
the catheter and a stiff glide was used. Catheter was formed in the
collecting system and small contrast confirmed location. StatLock
was placed.

Patient tolerated the procedure well and remained hemodynamically
stable throughout.

No complications were encountered and no significant blood loss.
IMPRESSION: Status post routine exchange of right percutaneous nephrostomy with
placement of a new 10 French catheter.

## 2020-04-12 NOTE — Progress Notes (Signed)
Remote pacemaker transmission.   

## 2020-04-21 ENCOUNTER — Other Ambulatory Visit: Payer: Self-pay

## 2020-04-21 ENCOUNTER — Other Ambulatory Visit (HOSPITAL_COMMUNITY): Payer: Self-pay | Admitting: Interventional Radiology

## 2020-04-21 ENCOUNTER — Ambulatory Visit (HOSPITAL_COMMUNITY)
Admission: RE | Admit: 2020-04-21 | Discharge: 2020-04-21 | Disposition: A | Discharge status: Home / Self Care | Payer: No Typology Code available for payment source | Source: Ambulatory Visit | Attending: Interventional Radiology | Admitting: Interventional Radiology

## 2020-04-21 DIAGNOSIS — N135 Crossing vessel and stricture of ureter without hydronephrosis: Secondary | ICD-10-CM

## 2020-04-21 DIAGNOSIS — Z436 Encounter for attention to other artificial openings of urinary tract: Secondary | ICD-10-CM | POA: Diagnosis not present

## 2020-04-21 HISTORY — PX: IR NEPHROSTOMY EXCHANGE RIGHT: IMG6070

## 2020-04-21 NOTE — Procedures (Signed)
Interventional Radiology Procedure Note  Procedure: FLUORO RIGHT 10 FR PCN EXCHG(DAWSON MEULLER)    Complications: None  Estimated Blood Loss:  MIN  Findings: SUCCESSFUL EXCHG  FULL REPORT IN PACS     Tamera Punt, MD

## 2020-05-31 IMAGING — XA IR EXCHANGE NEPHROSTOMY RIGHT
1 series · 2 of 2 positions shown · non-contrast
Comparison: none

CLINICAL DATA: Bladder carcinoma, long-term indwelling right
nephrostomy catheter.

EXAM:
RIGHT PERCUTANEOUS NEPHROSTOMY CATHETER EXCHANGE UNDER FLUOROSCOPY
FLUOROSCOPY TIME:  4 minutes; 567  uSymO DAP
TECHNIQUE: The procedure, risks (including but not limited to bleeding,
infection, organ damage ), benefits, and alternatives were explained
to the patient. Questions regarding the procedure were encouraged
and answered. The patient understands and consents to the procedure.
The nephrostomy tube and surrounding skin were prepped with
Betadine, draped in usual sterile fashion.

[Series 300: ir nephrostomy exchange right · 2 of 2 slices shown]
[im 1/2]
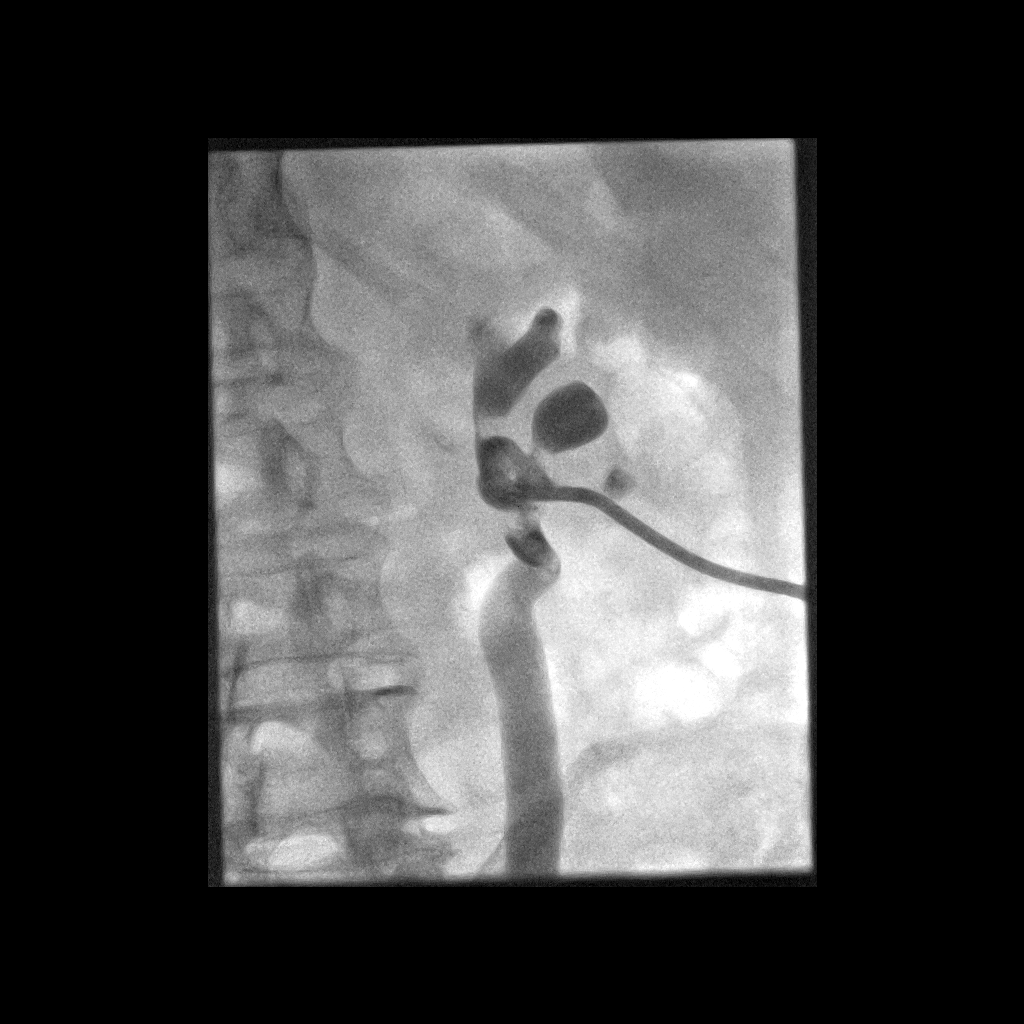
[im 2/2]
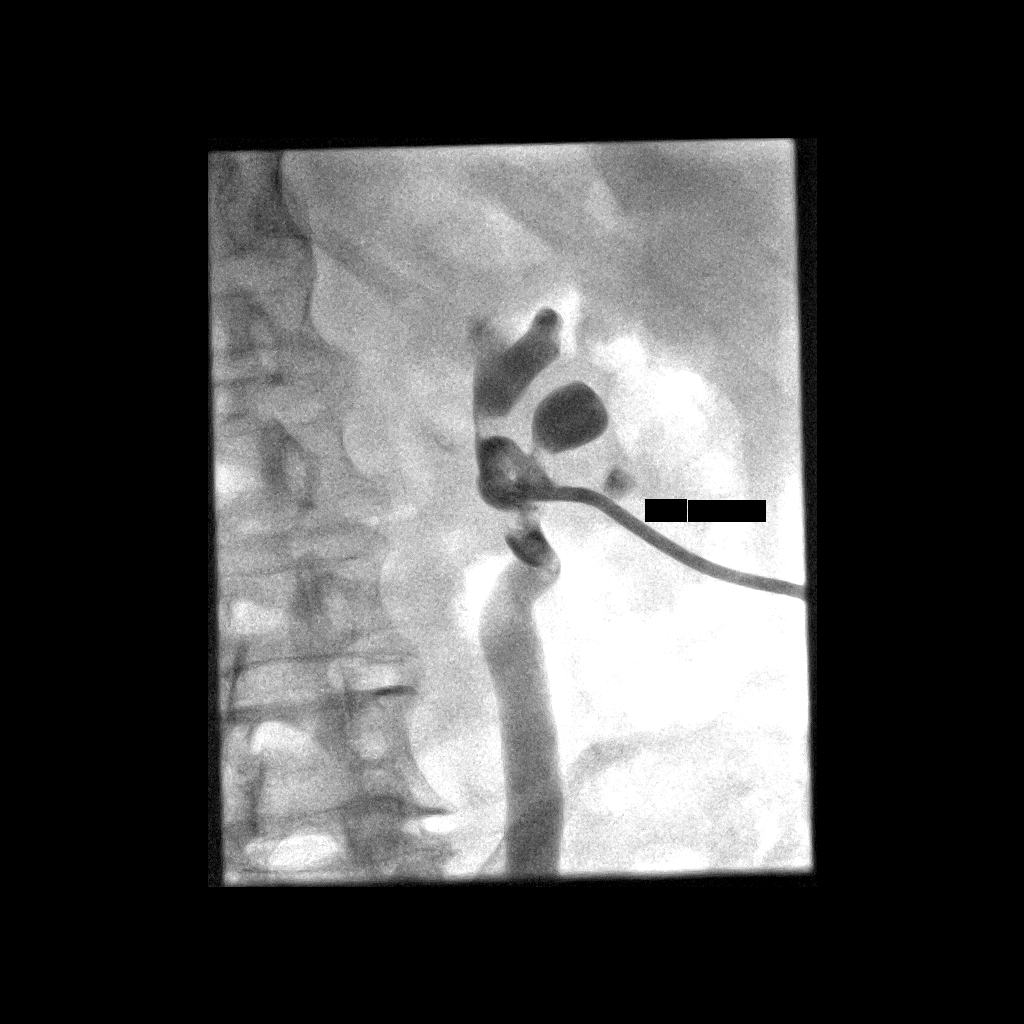

[2 of 2 positions shown; findings below may reference images not displayed]

A small amount of contrast was injected through the right
nephrostomy catheter to opacify the renal collecting system.
Catheter was noted to pulled back into a peripheral calyx. The
catheter was cut and exchanged over a 0.035 "stiff hydrophilic
angiographic wire. The guidewire did not advance centrally. A 5
French 40 cm Kumpe catheter was advanced and used to gain access to
the collecting system centrally. The catheter was exchanged over the
guidewire for a new 10-Sofa Jomana pigtail catheter,
formed centrally within the collecting system under fluoroscopy.
Contrast injection confirms appropriate positioning.

Catheter secured externally with StatLock as before. The patient
tolerated the procedure well.

COMPLICATIONS:
None immediate
IMPRESSION: 1. Technically successful exchange of right nephrostomy catheter
under fluoroscopy

## 2020-06-02 ENCOUNTER — Other Ambulatory Visit (HOSPITAL_COMMUNITY): Payer: Self-pay | Admitting: Interventional Radiology

## 2020-06-02 ENCOUNTER — Ambulatory Visit (HOSPITAL_COMMUNITY)
Admission: RE | Admit: 2020-06-02 | Discharge: 2020-06-02 | Disposition: A | Discharge status: Home / Self Care | Source: Ambulatory Visit | Attending: Interventional Radiology | Admitting: Interventional Radiology

## 2020-06-02 ENCOUNTER — Other Ambulatory Visit: Payer: Self-pay

## 2020-06-02 DIAGNOSIS — N135 Crossing vessel and stricture of ureter without hydronephrosis: Secondary | ICD-10-CM

## 2020-06-02 DIAGNOSIS — Z436 Encounter for attention to other artificial openings of urinary tract: Secondary | ICD-10-CM | POA: Diagnosis not present

## 2020-06-02 DIAGNOSIS — Z4803 Encounter for change or removal of drains: Secondary | ICD-10-CM | POA: Insufficient documentation

## 2020-06-02 HISTORY — PX: IR NEPHROSTOMY EXCHANGE RIGHT: IMG6070

## 2020-06-02 MED ORDER — IOHEXOL 300 MG/ML  SOLN
50.0000 mL | Freq: Once | INTRAMUSCULAR | Status: AC | PRN
  Administered 2020-06-02: 15 mL

## 2020-06-28 ENCOUNTER — Ambulatory Visit: Payer: No Typology Code available for payment source

## 2020-06-29 LAB — CUP PACEART REMOTE DEVICE CHECK
Battery Impedance: 5455 Ohm
Battery Remaining Longevity: 5 mo
Battery Voltage: 2.63 V
Brady Statistic AP VP Percent: 67 %
Brady Statistic AP VS Percent: 30 %
Brady Statistic AS VP Percent: 1 %
Brady Statistic AS VS Percent: 1 %
Date Time Interrogation Session: 20220406120006
Implantable Lead Implant Date: 20090917
Implantable Lead Implant Date: 20090917
Implantable Lead Location: 753859
Implantable Lead Location: 753860
Implantable Lead Model: 5076
Implantable Lead Model: 5076
Implantable Pulse Generator Implant Date: 20090917
Lead Channel Impedance Value: 439 Ohm
Lead Channel Impedance Value: 623 Ohm
Lead Channel Pacing Threshold Amplitude: 0.5 V
Lead Channel Pacing Threshold Amplitude: 0.5 V
Lead Channel Pacing Threshold Pulse Width: 0.4 ms
Lead Channel Pacing Threshold Pulse Width: 0.4 ms
Lead Channel Setting Pacing Amplitude: 2 V
Lead Channel Setting Pacing Amplitude: 2.5 V
Lead Channel Setting Pacing Pulse Width: 0.4 ms
Lead Channel Setting Sensing Sensitivity: 2 mV

## 2020-06-30 ENCOUNTER — Telehealth: Payer: Self-pay | Admitting: Internal Medicine

## 2020-06-30 NOTE — Telephone Encounter (Signed)
Patient's daughter would like to discuss Dr. Tanna Furry plan for the patient's PPM. She states in January, 2022 they were advised that his PPM has 12 months of battery life remaining. However, she states during his most recently PPM check she was told that he has 5 months remaining. Please return call to discuss.

## 2020-06-30 NOTE — Telephone Encounter (Signed)
Spoke with patient's Medical Power of Attorney about battery life of the pacemaker. As of 06/28/2020 estimated battery life until ERI is 5 months. Patient is scheduled for monthly battery checks. I advised patient's daughter that when ERI was triggered that patient would get a call about scheduling GEN change out. Patient's daughter (DPR) ask if patient could have a tele-visit due to weakness and unable to ambulate if office visit was needed to discuss Gen Change. I advised her to mention this again, when patient met ERI and was called by the clinic and that we would send the request to Dr. Lovena Le at that time. She was also informed that battery life was monitored every month and previous readings were estimated battery life depending on numerous factors. Patient's daughter verbalized understanding.

## 2020-07-14 ENCOUNTER — Other Ambulatory Visit (HOSPITAL_COMMUNITY): Payer: No Typology Code available for payment source

## 2020-07-19 ENCOUNTER — Other Ambulatory Visit (HOSPITAL_COMMUNITY): Payer: Self-pay | Admitting: Interventional Radiology

## 2020-07-19 ENCOUNTER — Other Ambulatory Visit: Payer: Self-pay

## 2020-07-19 ENCOUNTER — Ambulatory Visit (HOSPITAL_COMMUNITY)
Admission: RE | Admit: 2020-07-19 | Discharge: 2020-07-19 | Disposition: A | Discharge status: Home / Self Care | Source: Ambulatory Visit | Attending: Interventional Radiology | Admitting: Interventional Radiology

## 2020-07-19 DIAGNOSIS — Z436 Encounter for attention to other artificial openings of urinary tract: Secondary | ICD-10-CM | POA: Diagnosis present

## 2020-07-19 DIAGNOSIS — N135 Crossing vessel and stricture of ureter without hydronephrosis: Secondary | ICD-10-CM | POA: Insufficient documentation

## 2020-07-19 DIAGNOSIS — Z466 Encounter for fitting and adjustment of urinary device: Secondary | ICD-10-CM | POA: Diagnosis not present

## 2020-07-19 HISTORY — PX: IR NEPHROSTOMY EXCHANGE RIGHT: IMG6070

## 2020-07-19 IMAGING — XA IR EXCHANGE NEPHROSTOMY RIGHT
1 series · 2 of 2 positions shown · non-contrast
Comparison: 08/13/2018

INDICATION: [AGE] male with a history of right ureteral obstruction. He
presents for routine percutaneous nephrostomy exchange

EXAM:
IR EXCHANGE NEPHROSTOMY RIGHT

[Series 300: tube placements · 2 of 2 slices shown]
[im 1/2]
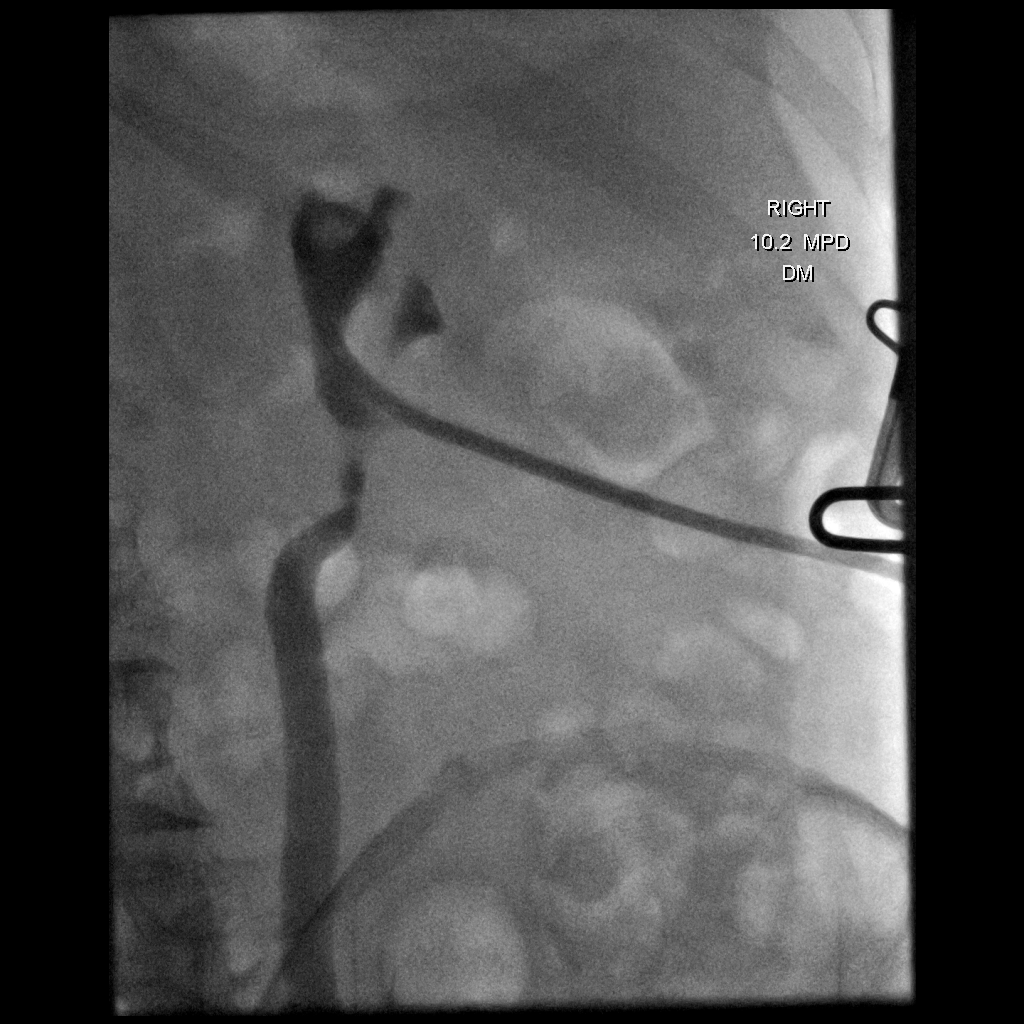
[im 2/2]
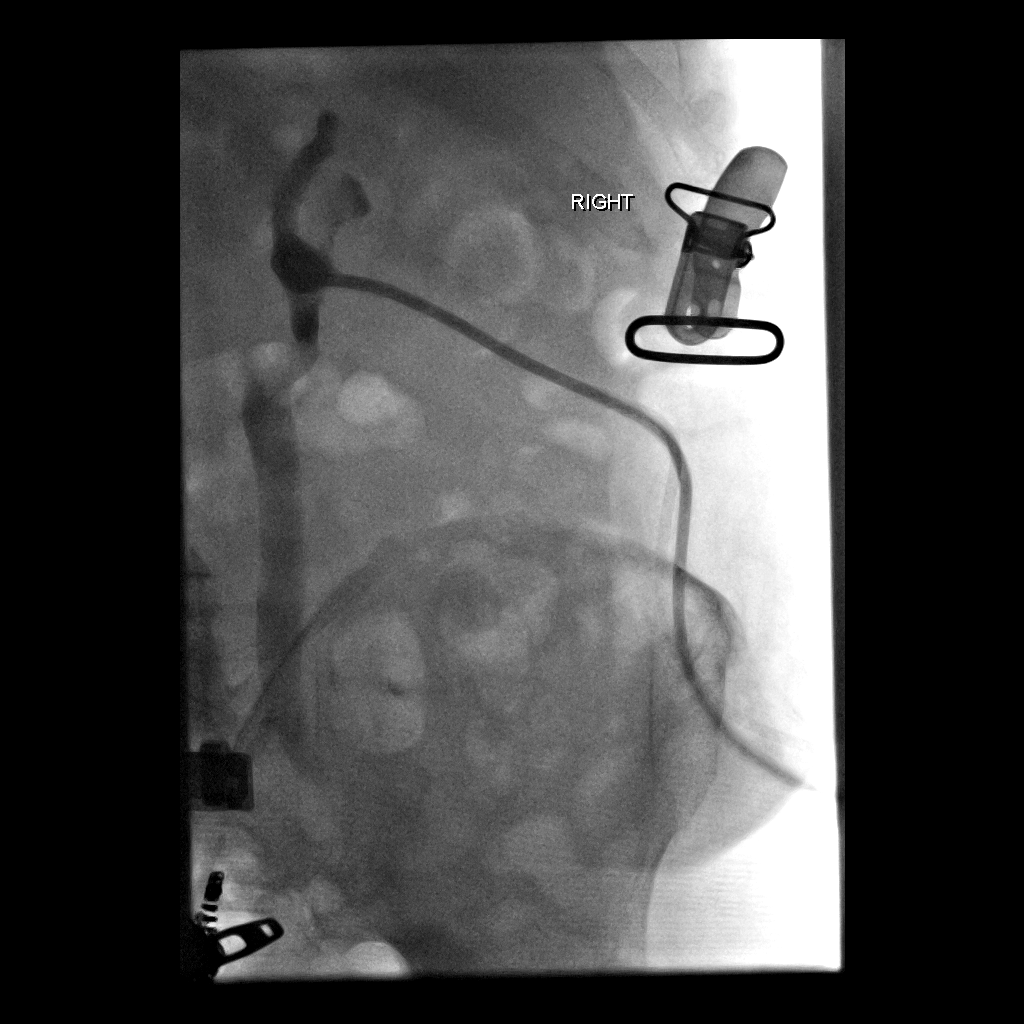

[2 of 2 positions shown; findings below may reference images not displayed]

MEDICATIONS:
None

ANESTHESIA/SEDATION:
None

CONTRAST:  10mL OMNIPAQUE IOHEXOL 300 MG/ML SOLN - administered into
the collecting system(s)

FLUOROSCOPY TIME:  Fluoroscopy Time: 0 minutes 24 seconds (3.2 mGy).

COMPLICATIONS:
None

PROCEDURE:
Informed written consent was obtained from the patient after a
thorough discussion of the procedural risks, benefits and
alternatives. All questions were addressed. Maximal Sterile Barrier
Technique was utilized including caps, mask, sterile gowns, sterile
gloves, sterile drape, hand hygiene and skin antiseptic. A timeout
was performed prior to the initiation of the procedure.

Patient positioned prone position on the fluoroscopy table. Scout
images were acquired. Gentle contrast injection confirmed location
of the right PCN. Catheter was ligated. Modified Seldinger technique
was used to exchange for a new 10 French pigtail catheter. Gentle
injection of contrast confirmed location.

Catheter was attached to gravity drainage.  No suture was applied.

Patient tolerated the procedure well and remained hemodynamically
stable throughout.

No complications were encountered and no significant blood loss.
IMPRESSION: Status post routine exchange of right percutaneous nephrostomy.

## 2020-07-19 MED ORDER — IOHEXOL 300 MG/ML  SOLN
50.0000 mL | Freq: Once | INTRAMUSCULAR | Status: AC | PRN
  Administered 2020-07-19: 5 mL

## 2020-07-19 MED ORDER — LIDOCAINE HCL 1 % IJ SOLN
INTRAMUSCULAR | Status: AC
  Filled 2020-07-19: qty 20

## 2020-07-24 NOTE — Telephone Encounter (Signed)
Last remote transmission on 06/28/20.  Battery status is estimated 5 months until ERI.  Current battery voltage is at 2.63V

## 2020-07-28 ENCOUNTER — Telehealth (INDEPENDENT_AMBULATORY_CARE_PROVIDER_SITE_OTHER): Admitting: Internal Medicine

## 2020-07-28 ENCOUNTER — Other Ambulatory Visit: Payer: Self-pay

## 2020-07-28 ENCOUNTER — Encounter: Payer: Self-pay | Admitting: Internal Medicine

## 2020-07-28 VITALS — BP 154/82 | HR 47 | Ht 67.0 in | Wt 140.0 lb

## 2020-07-28 DIAGNOSIS — Z950 Presence of cardiac pacemaker: Secondary | ICD-10-CM | POA: Diagnosis not present

## 2020-07-28 DIAGNOSIS — I5033 Acute on chronic diastolic (congestive) heart failure: Secondary | ICD-10-CM

## 2020-07-28 DIAGNOSIS — R001 Bradycardia, unspecified: Secondary | ICD-10-CM

## 2020-07-28 NOTE — Progress Notes (Signed)
Electrophysiology TeleHealth Note   Due to national recommendations of social distancing due to COVID 19, an audio/video telehealth visit is felt to be most appropriate for this patient at this time.  See MyChart message from today for the patient's consent to telehealth for [facility].   Date:  07/28/2020   ID:  [name] [name], DOB 09-29-1921, MRN [id number]  Location: patient's home  Provider location: [address], [place]  Evaluation Performed: Follow-up visit  PCP:  Crist Infante, MD  Cardiologist:  Lauree Chandler, MD  Electrophysiologist:  Dr Elvina Sidle  Chief Complaint:  PPM followup.  History of Present Illness:    [name] [name] is a 87 y.o. male who presents via audio/video conferencing for a telehealth visit today.  Since last being seen in our clinic, the patient's family notes that he has progressively gotten weaker and has been on hospice care for almost a year. He appetite is poor. He has no specific complaints today though his daughter tells me that he is very sedentary.    Past Medical History:  Diagnosis Date  . Aortic valve stenosis    a. mild to moderate by echo 2013  . Bradycardia    MDT dual chamber pacemaker  . Cancer [facility])    bladder cancer  . Chronic kidney disease   . Deep venous thrombosis (HCC)    hx of  LEFT ARM AFTER PACEMAKER INSERTION ABOUT 6 YRS AGO  . Depression   . Gout   . Hypertension   . Kidney stone   . Macular degeneration    PT STATES HIS VISION STILL OKAY FOR DRIVING  . Osteoporosis   . Prostate cancer ([place])    AND BLADDER CANCER - S/P URETEROILEAL CONDUIT  . Renal disorder   . S/P ileal conduit [facility])         Past Surgical History:  Procedure Laterality Date  . 07/20/13  CONVERSION OF RIGHT SIDED NEPHROSTOMY CATHETER TO RIGHT SIDED NEPHRO URETERAL CATHETER - DONE IN INTERVENTIONAL RADIOLOGY    . APPENDECTOMY    . BALLOON DILATION N/A 04/27/2019   Procedure: BALLOON DILATION;  Surgeon: Clarene Essex, MD;  Location: [facility];  Service: Endoscopy;  Laterality: N/A;  pyloric   . CATARACT EXTRACTION, BILATERAL    . CYSTECTOMY    . ERCP N/A 04/27/2019   Procedure: ENDOSCOPIC RETROGRADE CHOLANGIOPANCREATOGRAPHY (ERCP);  Surgeon: Clarene Essex, MD;  Location[facility];  Service: Endoscopy;  Laterality: N/A;  . ESOPHAGOGASTRODUODENOSCOPY N/A 04/27/2019   Procedure: ESOPHAGOGASTRODUODENOSCOPY (EGD);  Surgeon: Clarene Essex, MD;  Location: [facility];  Service: Endoscopy;  Laterality: N/A;  . ilial conduit     for bladder cancer  . IR GENERIC HISTORICAL  10/26/2015   IR NEPHROSTOMY TUBE CHANGE 10/26/2015 Jacqulynn Cadet, MD [facility]  . IR GENERIC HISTORICAL  12/07/2015   IR NEPHROSTOMY TUBE CHANGE 12/07/2015 Greggory Keen, MD [facility]  . IR GENERIC HISTORICAL  01/11/2016   IR NEPHROSTOMY TUBE CHANGE 01/11/2016 Greggory Keen, MD [facility]  . IR GENERIC HISTORICAL  02/08/2016   IR NEPHROSTOMY TUBE CHANGE 02/08/2016 [facility]  . IR GENERIC HISTORICAL  03/14/2016   IR NEPHROSTOMY TUBE CHANGE 03/14/2016 Jacqulynn Cadet, MD [facility]  . IR GENERIC HISTORICAL  04/22/2016   IR NEPHROSTOMY TUBE CHANGE 04/22/2016 Aletta Edouard, MD [facility]  . IR GENERIC HISTORICAL  05/27/2016   IR NEPHROSTOMY TUBE CHANGE 05/27/2016 Arne Cleveland, MD [facility]  . IR NEPHROSTOMY EXCHANGE RIGHT  07/25/2017  .  IR NEPHROSTOMY EXCHANGE RIGHT  10/13/2017  . IR NEPHROSTOMY EXCHANGE RIGHT  11/18/2017  . IR NEPHROSTOMY EXCHANGE RIGHT  12/30/2017  . IR NEPHROSTOMY EXCHANGE RIGHT  02/10/2018  . IR NEPHROSTOMY EXCHANGE RIGHT  03/23/2018  . IR NEPHROSTOMY EXCHANGE RIGHT  05/04/2018  . IR NEPHROSTOMY EXCHANGE RIGHT  06/25/2018  . IR NEPHROSTOMY EXCHANGE RIGHT  08/13/2018  . IR NEPHROSTOMY EXCHANGE RIGHT  10/01/2018  . IR NEPHROSTOMY EXCHANGE RIGHT  11/19/2018  . IR NEPHROSTOMY EXCHANGE RIGHT  01/05/2019  . IR NEPHROSTOMY EXCHANGE RIGHT  04/01/2019  . IR NEPHROSTOMY EXCHANGE RIGHT  05/18/2019  . IR NEPHROSTOMY  EXCHANGE RIGHT  06/29/2019  . IR NEPHROSTOMY EXCHANGE RIGHT  08/09/2019  . IR NEPHROSTOMY EXCHANGE RIGHT  09/20/2019  . IR NEPHROSTOMY EXCHANGE RIGHT  11/01/2019  . IR NEPHROSTOMY EXCHANGE RIGHT  12/13/2019  . IR NEPHROSTOMY EXCHANGE RIGHT  01/26/2020  . IR NEPHROSTOMY EXCHANGE RIGHT  03/10/2020  . IR NEPHROSTOMY EXCHANGE RIGHT  04/21/2020  . IR NEPHROSTOMY EXCHANGE RIGHT  06/02/2020  . IR NEPHROSTOMY EXCHANGE RIGHT  07/19/2020  . IR NEPHROSTOMY PLACEMENT RIGHT  06/13/2017  . IR NEPHROSTOMY PLACEMENT RIGHT  02/23/2019  . IR NEPHROSTOMY TUBE CHANGE  07/01/2016  . IR NEPHROSTOMY TUBE CHANGE  08/05/2016  . IR NEPHROSTOMY TUBE CHANGE  09/18/2016  . IR NEPHROSTOMY TUBE CHANGE  10/24/2016  . IR NEPHROSTOMY TUBE CHANGE  11/18/2016  . IR NEPHROSTOMY TUBE CHANGE  12/30/2016  . IR NEPHROSTOMY TUBE CHANGE  02/03/2017  . IR NEPHROSTOMY TUBE CHANGE  03/13/2017  . IR NEPHROSTOMY TUBE CHANGE  04/17/2017  . IR NEPHROSTOMY TUBE CHANGE  05/28/2017  . IR NEPHROSTOMY TUBE CHANGE  09/04/2017  . KIDNEY STONE SURGERY    . LYMPHADENECTOMY    . NEPHROLITHOTOMY  09/02/2011   Procedure: NEPHROLITHOTOMY PERCUTANEOUS;  Surgeon: Malka So, MD;  Location: [facility];  Service: Urology;  Laterality: Left;  . NEPHROLITHOTOMY Right 07/27/2013   Procedure: RIGHT PERCUTANEOUS NEPHROLITHOTOMY ;  Surgeon: Irine Seal, MD;  Location: [facility];  Service: Urology;  Laterality: Right;  . PACEMAKER INSERTION  2009   MDT dual chamber pacemaker implanted by Dr Verlon Setting  . PROSTATE SURGERY    . REMOVAL OF STONES  04/27/2019   Procedure: REMOVAL OF STONES;  Surgeon: Clarene Essex, MD;  Location: [facility];  Service: Endoscopy;;  . Joan Mayans  04/27/2019   Procedure: Joan Mayans;  Surgeon: Clarene Essex, MD;  Location: [facility];  Service: Endoscopy;;    Current Outpatient Medications  Medication Sig Dispense Refill  . acetaminophen (TYLENOL) 500 MG tablet Take 1,000 mg by mouth every 8 (eight) hours as needed for mild pain or headache.    Marland Kitchen aspirin  EC 81 MG tablet Take 81 mg by mouth every morning.    Marland Kitchen Besifloxacin HCl (BESIVANCE) 0.6 % SUSP Apply 1 drop to eye See admin instructions. Only uses before eye shot he receives every 10 weeks.    . Cholecalciferol (VITAMIN D-3) 1000 UNITS CAPS Take 1 capsule by mouth every morning.    . docusate sodium (COLACE) 100 MG capsule Take 300 mg by mouth daily.     . furosemide (LASIX) 40 MG tablet Take 1 tablet by mouth daily.    . Hyprom-Naphaz-Polysorb-Zn Sulf (CLEAR EYES COMPLETE OP) Apply 1-2 drops to eye daily as needed (dry eyes).    Marland Kitchen levothyroxine (SYNTHROID) 25 MCG tablet Take 25 mcg by mouth every morning.    . metoprolol succinate (TOPROL-XL) 12.5 mg TB24 24 hr tablet Take 12.5 mg  by mouth daily.    . Multiple Vitamins-Minerals (VITEYES AREDS ADVANCED PO) Take 2 capsules by mouth daily.    . polyethylene glycol powder (GLYCOLAX/MIRALAX) 17 GM/SCOOP powder Take 17 g by mouth daily as needed for mild constipation. Takes 17grams (1 capful) as needed. 255 g 0  . ULORIC 40 MG tablet Take 1 tablet by mouth daily.    . pantoprazole (PROTONIX) 40 MG tablet Take 1 tablet (40 mg total) by mouth daily. 30 tablet 0   No current facility-administered medications for this visit.    Allergies:   Patient has no known allergies.   Social History:  The patient  reports that he quit smoking about 68 years ago. His smoking use included cigarettes. He has never used smokeless tobacco. He reports that he does not drink alcohol and does not use drugs.   Family History:  The patient's  family history includes Pancreatic cancer (age of onset: 56) in his father; Pancreatic cancer (age of onset: 65) in his mother.   ROS:  Please see the history of present illness.   All other systems are personally reviewed and negative.    Exam:    Vital Signs:  BP (!) 154/82   Pulse (!) 47   Ht 5\' 7"  (1.702 m)   Wt 140 lb (63.5 kg)   BMI 21.93 kg/m    Labs/Other Tests and Data Reviewed:    Recent Labs: No results  found for requested labs within last 8760 hours.   Wt Readings from Last 3 Encounters:  07/28/20 140 lb (63.5 kg)  06/22/19 147 lb (66.7 kg)  06/17/19 147 lb (66.7 kg)     Other studies personally reviewed:  Last device remote is reviewed from [employer] PDF dated 4/22 which reveals normal device function, no arrhythmias    ASSESSMENT & PLAN:    1.  Sinus node dysfunction - he is asymptomatic s/p PPM insertion. 2. PPM - his device is approaching ERI. Do to his declining health and hospice care, the patient and his family would like to avoid the pain of PM gen change out which I think is reasonable. If he starts to do better, I think we could change our plan.  3. HTN - his bp is fairly well controlled. We will follow.    COVID 19 screen The patient denies symptoms of COVID 19 at this time.  The importance of social distancing was discussed today.  Follow-up:  prn Next remote: we will cancel these  Current medicines are reviewed at length with the patient today.   The patient does not have concerns regarding his medicines.  The following changes were made today:  none  Labs/ tests ordered today include: none No orders of the defined types were placed in this encounter.    Patient Risk:  after full review of this patients clinical status, I feel that they are at moderate risk at this time.  Today, I have spent 21 minutes with the patient with telehealth technology discussing all of the above.    Signed, Cristopher Peru, MD  07/28/2020 9:48 AM     [facility] [address] [place] [address] [phone] (office) [phone] (fax)

## 2020-07-28 NOTE — Patient Instructions (Signed)
Medication Instructions:  Your physician recommends that you continue on your current medications as directed. Please refer to the Current Medication list given to you today.  Labwork: None ordered.  Testing/Procedures: None ordered.  Follow-Up: Your physician wants you to follow-up in: as needed with Cristopher Peru, MD  Discontinue remote monitoring  Any Other Special Instructions Will Be Listed Below (If Applicable).  If you need a refill on your cardiac medications before your next appointment, please call your pharmacy.

## 2020-08-17 ENCOUNTER — Other Ambulatory Visit: Payer: Self-pay

## 2020-08-17 ENCOUNTER — Encounter (HOSPITAL_COMMUNITY): Payer: Self-pay

## 2020-08-17 ENCOUNTER — Emergency Department (HOSPITAL_COMMUNITY)
Admission: EM | Admit: 2020-08-17 | Discharge: 2020-08-18 | Disposition: A | Discharge status: Home / Self Care | Payer: No Typology Code available for payment source | Attending: Emergency Medicine | Admitting: Emergency Medicine

## 2020-08-17 DIAGNOSIS — I132 Hypertensive heart and chronic kidney disease with heart failure and with stage 5 chronic kidney disease, or end stage renal disease: Secondary | ICD-10-CM | POA: Diagnosis not present

## 2020-08-17 DIAGNOSIS — Z79899 Other long term (current) drug therapy: Secondary | ICD-10-CM | POA: Diagnosis not present

## 2020-08-17 DIAGNOSIS — Z8546 Personal history of malignant neoplasm of prostate: Secondary | ICD-10-CM | POA: Insufficient documentation

## 2020-08-17 DIAGNOSIS — R1110 Vomiting, unspecified: Secondary | ICD-10-CM

## 2020-08-17 DIAGNOSIS — Z8551 Personal history of malignant neoplasm of bladder: Secondary | ICD-10-CM | POA: Insufficient documentation

## 2020-08-17 DIAGNOSIS — N489 Disorder of penis, unspecified: Secondary | ICD-10-CM | POA: Diagnosis not present

## 2020-08-17 DIAGNOSIS — Z950 Presence of cardiac pacemaker: Secondary | ICD-10-CM | POA: Insufficient documentation

## 2020-08-17 DIAGNOSIS — I5033 Acute on chronic diastolic (congestive) heart failure: Secondary | ICD-10-CM | POA: Insufficient documentation

## 2020-08-17 DIAGNOSIS — N185 Chronic kidney disease, stage 5: Secondary | ICD-10-CM | POA: Diagnosis not present

## 2020-08-17 DIAGNOSIS — Z87891 Personal history of nicotine dependence: Secondary | ICD-10-CM | POA: Insufficient documentation

## 2020-08-17 DIAGNOSIS — Z7982 Long term (current) use of aspirin: Secondary | ICD-10-CM | POA: Diagnosis not present

## 2020-08-17 DIAGNOSIS — R112 Nausea with vomiting, unspecified: Secondary | ICD-10-CM | POA: Diagnosis not present

## 2020-08-17 DIAGNOSIS — N289 Disorder of kidney and ureter, unspecified: Secondary | ICD-10-CM

## 2020-08-17 DIAGNOSIS — R197 Diarrhea, unspecified: Secondary | ICD-10-CM | POA: Diagnosis not present

## 2020-08-17 DIAGNOSIS — R55 Syncope and collapse: Secondary | ICD-10-CM

## 2020-08-17 LAB — CBC WITH DIFFERENTIAL/PLATELET
Abs Immature Granulocytes: 0.05 10*3/uL (ref 0.00–0.07)
Basophils Absolute: 0.1 10*3/uL (ref 0.0–0.1)
Basophils Relative: 1 %
Eosinophils Absolute: 0.8 10*3/uL — ABNORMAL HIGH (ref 0.0–0.5)
Eosinophils Relative: 9 %
HCT: 32.3 % — ABNORMAL LOW (ref 39.0–52.0)
Hemoglobin: 10.4 g/dL — ABNORMAL LOW (ref 13.0–17.0)
Immature Granulocytes: 1 %
Lymphocytes Relative: 17 %
Lymphs Abs: 1.4 10*3/uL (ref 0.7–4.0)
MCH: 32.1 pg (ref 26.0–34.0)
MCHC: 32.2 g/dL (ref 30.0–36.0)
MCV: 99.7 fL (ref 80.0–100.0)
Monocytes Absolute: 0.7 10*3/uL (ref 0.1–1.0)
Monocytes Relative: 8 %
Neutro Abs: 5.5 10*3/uL (ref 1.7–7.7)
Neutrophils Relative %: 64 %
Platelets: 195 10*3/uL (ref 150–400)
RBC: 3.24 MIL/uL — ABNORMAL LOW (ref 4.22–5.81)
RDW: 12.6 % (ref 11.5–15.5)
WBC: 8.5 10*3/uL (ref 4.0–10.5)
nRBC: 0 % (ref 0.0–0.2)

## 2020-08-17 LAB — COMPREHENSIVE METABOLIC PANEL
ALT: 9 U/L (ref 0–44)
AST: 20 U/L (ref 15–41)
Albumin: 3 g/dL — ABNORMAL LOW (ref 3.5–5.0)
Alkaline Phosphatase: 53 U/L (ref 38–126)
Anion gap: 12 (ref 5–15)
BUN: 115 mg/dL — ABNORMAL HIGH (ref 8–23)
CO2: 24 mmol/L (ref 22–32)
Calcium: 8.5 mg/dL — ABNORMAL LOW (ref 8.9–10.3)
Chloride: 104 mmol/L (ref 98–111)
Creatinine, Ser: 3.11 mg/dL — ABNORMAL HIGH (ref 0.61–1.24)
GFR, Estimated: 18 mL/min — ABNORMAL LOW (ref >60)
Glucose, Bld: 120 mg/dL — ABNORMAL HIGH (ref 70–99)
Potassium: 4 mmol/L (ref 3.5–5.1)
Sodium: 140 mmol/L (ref 135–145)
Total Bilirubin: 0.4 mg/dL (ref 0.3–1.2)
Total Protein: 6.7 g/dL (ref 6.5–8.1)

## 2020-08-17 LAB — LIPASE, BLOOD: Lipase: 35 U/L (ref 11–51)

## 2020-08-17 LAB — CBG MONITORING, ED: Glucose-Capillary: 107 mg/dL — ABNORMAL HIGH (ref 70–99)

## 2020-08-17 MED ORDER — SODIUM CHLORIDE 0.9 % IV SOLN: INTRAVENOUS | Status: DC

## 2020-08-17 MED ORDER — ONDANSETRON HCL 4 MG/2ML IJ SOLN
4.0000 mg | Freq: Once | INTRAMUSCULAR | Status: AC
  Administered 2020-08-17: 4 mg via INTRAVENOUS
  Filled 2020-08-17: qty 2

## 2020-08-17 NOTE — ED Triage Notes (Addendum)
Pt was walking to restroom when he began to feel nauseated , had multiple episodes of vomiting with EMS. As per EMS pts family called out for unresponsiveness. Pt denies any dizziness, chest pain, falls. Just c/o nausea. Given 719ml of normal saline by medic.

## 2020-08-17 NOTE — ED Provider Notes (Signed)
[place] DEPT Provider Note   CSN: [id number] Arrival date & time: 08/17/20  1657     History Chief Complaint  Patient presents with  . Emesis    [name] [name] is a 87 y.o. male.  Patient brought in by EMS.  Caregiver was at the house.  Patient daughter is here with him now.  Patient was in the restroom trying to have a bowel movement.  Which according to the daughter he had what the caregiver thought was a TIA.  Patient became unresponsive and was just staring forward.  Then had some contraction of his hand.  And some changes to his face.  Patient came to and then had multiple episodes of vomiting.  No vomiting since he is arrived here.  Patient has no specific complaints here at this time.  Patient's past medical history is significant for status post ileal conduit.  Also on the right side he has a percutaneous tube in his venous system.  Also has a pacemaker.  That AV paced and the daughter states that it is set to keep his heart rate between 40-60.  Patient had a cystectomy as he had prostate and bladder cancer.  Patient is known to have chronic kidney disease get the history of the prostate cancer and bladder cancer.  I have received the pacemaker due to bradycardia.  History of hypertension.  And has aortic valve stenosis mild to moderate by echo in 2013.  Patient here has felt fine.  No further vomiting.  Patient received some IV fluids feels much better.  Patient denied any abdominal pain.  Patient's daughter states that he does have intermittent constipation so the fact that he was straining hard to have a bowel movement which was not unusual.  He makes no urine through his penis because the bladder has been removed.  Also the caregiver mention blood from the tip of his penis.  As mentioned family and caregiver were concerned that he had had a stroke.  EMS reported that blood sugars were low like in the 40s.  It is not clear that they gave him anything  for this.  Blood sugars here were in the low 100s patient's daughter states that he spends most of the time that there is very little walking.  They use a lift to transfer him.  In addition there was no fall he did not hit his head.        Past Medical History:  Diagnosis Date  . Aortic valve stenosis    a. mild to moderate by echo 2013  . Bradycardia    MDT dual chamber pacemaker  . Cancer [facility])    bladder cancer  . Chronic kidney disease   . Deep venous thrombosis (HCC)    hx of  LEFT ARM AFTER PACEMAKER INSERTION ABOUT 6 YRS AGO  . Depression   . Gout   . Hypertension   . Kidney stone   . Macular degeneration    PT STATES HIS VISION STILL OKAY FOR DRIVING  . Osteoporosis   . Prostate cancer ([place])    AND BLADDER CANCER - S/P URETEROILEAL CONDUIT  . Renal disorder   . S/P ileal conduit [facility])         Patient Active Problem List   Diagnosis Date Noted  . Enterococcal bacteremia 04/27/2019  . Choledocholithiasis 04/27/2019  . Status post ileal conduit ([place]) 04/27/2019  . Macrocytic anemia 04/27/2019  . HOH (hard of hearing) 04/27/2019  . History of  prostate cancer 04/27/2019  . Abnormal liver function 04/25/2019  . Acute on chronic diastolic CHF (congestive heart failure) ([place]) 12/09/2018  . Legally blind 06/13/2017  . Bladder cancer ([place]) 06/13/2017  . Sepsis ([place]) 02/04/2014  . Acute lower UTI 07/30/2013  . CKD (chronic kidney disease), stage IV ([place]) 07/29/2013  . Hydronephrosis, right 07/15/2013  . Kidney stone 08/20/2011  . Obstructive uropathy 08/16/2011  . Gout 08/16/2011  . Aortic stenosis 08/16/2011  . Hypertension 10/02/2010  . Pacemaker 10/02/2010  . Sinus bradycardia 10/02/2010    Past Surgical History:  Procedure Laterality Date  . 07/20/13  CONVERSION OF RIGHT SIDED NEPHROSTOMY CATHETER TO RIGHT SIDED NEPHRO URETERAL CATHETER - DONE IN INTERVENTIONAL RADIOLOGY    . APPENDECTOMY    . BALLOON DILATION N/A 04/27/2019   Procedure: BALLOON DILATION;   Surgeon: Clarene Essex, MD;  Location: [facility] ENDOSCOPY;  Service: Endoscopy;  Laterality: N/A;  pyloric   . CATARACT EXTRACTION, BILATERAL    . CYSTECTOMY    . ERCP N/A 04/27/2019   Procedure: ENDOSCOPIC RETROGRADE CHOLANGIOPANCREATOGRAPHY (ERCP);  Surgeon: Clarene Essex, MD;  Location: [facility];  Service: Endoscopy;  Laterality: N/A;  . ESOPHAGOGASTRODUODENOSCOPY N/A 04/27/2019   Procedure: ESOPHAGOGASTRODUODENOSCOPY (EGD);  Surgeon: Clarene Essex, MD;  Location: [facility] ENDOS[facility];  Service: Endoscopy;  Laterality: N/A;  . ilial conduit     for bladder cancer  . IR GENERIC HISTORICAL  10/26/2015   IR NEPHROSTOMY TUBE CHANGE 10/26/2015 Jacqulynn Cadet, MD [facility]-INTERV RAD  . IR GENERIC HISTORICAL  12/07/2015   IR NEPHROSTOMY TUBE CHANGE 12/07/2015 Greggory Keen, MD [facility]  . IR GENERIC HISTORICAL  01/11/2016   IR NEPHROSTOMY TUBE CHANGE 01/11/2016 Greggory Keen, MD [facility]  . IR GENERIC HISTORICAL  02/08/2016   IR NEPHROSTOMY TUBE CHANGE 02/08/2016 [facility]  . IR GENERIC HISTORICAL  03/14/2016   IR NEPHROSTOMY TUBE CHANGE 03/14/2016 Jacqulynn Cadet, MD [facility]  . IR GENERIC HISTORICAL  04/22/2016   IR NEPHROSTOMY TUBE CHANGE 04/22/2016 Aletta Edouard, MD [facility]  . IR GENERIC HISTORICAL  05/27/2016   IR NEPHROSTOMY TUBE CHANGE 05/27/2016 Arne Cleveland, MD [facility]  . IR NEPHROSTOMY EXCHANGE RIGHT  07/25/2017  . IR NEPHROSTOMY EXCHANGE RIGHT  10/13/2017  . IR NEPHROSTOMY EXCHANGE RIGHT  11/18/2017  . IR NEPHROSTOMY EXCHANGE RIGHT  12/30/2017  . IR NEPHROSTOMY EXCHANGE RIGHT  02/10/2018  . IR NEPHROSTOMY EXCHANGE RIGHT  03/23/2018  . IR NEPHROSTOMY EXCHANGE RIGHT  05/04/2018  . IR NEPHROSTOMY EXCHANGE RIGHT  06/25/2018  . IR NEPHROSTOMY EXCHANGE RIGHT  08/13/2018  . IR NEPHROSTOMY EXCHANGE RIGHT  10/01/2018  . IR NEPHROSTOMY EXCHANGE RIGHT  11/19/2018  . IR NEPHROSTOMY EXCHANGE RIGHT  01/05/2019  . IR NEPHROSTOMY EXCHANGE RIGHT  04/01/2019  . IR NEPHROSTOMY EXCHANGE RIGHT  05/18/2019  .  IR NEPHROSTOMY EXCHANGE RIGHT  06/29/2019  . IR NEPHROSTOMY EXCHANGE RIGHT  08/09/2019  . IR NEPHROSTOMY EXCHANGE RIGHT  09/20/2019  . IR NEPHROSTOMY EXCHANGE RIGHT  11/01/2019  . IR NEPHROSTOMY EXCHANGE RIGHT  12/13/2019  . IR NEPHROSTOMY EXCHANGE RIGHT  01/26/2020  . IR NEPHROSTOMY EXCHANGE RIGHT  03/10/2020  . IR NEPHROSTOMY EXCHANGE RIGHT  04/21/2020  . IR NEPHROSTOMY EXCHANGE RIGHT  06/02/2020  . IR NEPHROSTOMY EXCHANGE RIGHT  07/19/2020  . IR NEPHROSTOMY PLACEMENT RIGHT  06/13/2017  . IR NEPHROSTOMY PLACEMENT RIGHT  02/23/2019  . IR NEPHROSTOMY TUBE CHANGE  07/01/2016  . IR NEPHROSTOMY TUBE CHANGE  08/05/2016  . IR NEPHROSTOMY TUBE CHANGE  09/18/2016  . IR NEPHROSTOMY  TUBE CHANGE  10/24/2016  . IR NEPHROSTOMY TUBE CHANGE  11/18/2016  . IR NEPHROSTOMY TUBE CHANGE  12/30/2016  . IR NEPHROSTOMY TUBE CHANGE  02/03/2017  . IR NEPHROSTOMY TUBE CHANGE  03/13/2017  . IR NEPHROSTOMY TUBE CHANGE  04/17/2017  . IR NEPHROSTOMY TUBE CHANGE  05/28/2017  . IR NEPHROSTOMY TUBE CHANGE  09/04/2017  . KIDNEY STONE SURGERY    . LYMPHADENECTOMY    . NEPHROLITHOTOMY  09/02/2011   Procedure: NEPHROLITHOTOMY PERCUTANEOUS;  Surgeon: Malka So, MD;  Location: [facility] ORS;  Service: Urology;  Laterality: Left;  . NEPHROLITHOTOMY Right 07/27/2013   Procedure: RIGHT PERCUTANEOUS NEPHROLITHOTOMY ;  Surgeon: Irine Seal, MD;  Location: [facility] ORS;  Service: Urology;  Laterality: Right;  . PACEMAKER INSERTION  2009   MDT dual chamber pacemaker implanted by Dr Verlon Setting  . PROSTATE SURGERY    . REMOVAL OF STONES  04/27/2019   Procedure: REMOVAL OF STONES;  Surgeon: Clarene Essex, MD;  Location: [facility] ENDOS[facility];  Service: Endoscopy;;  . Joan Mayans  04/27/2019   Procedure: Joan Mayans;  Surgeon: Clarene Essex, MD;  Location: [facility] ENDOSCOPY;  Service: Endoscopy;;       Family History  Problem Relation Age of Onset  . Pancreatic cancer Father 6       died  . Pancreatic cancer Mother 73       died    Social History   Tobacco Use  . Smoking  status: Former Smoker    Types: Cigarettes    Quit date: 03/25/1952    Years since quitting: 68.4  . Smokeless tobacco: Never Used  Substance Use Topics  . Alcohol use: No  . Drug use: No    Home Medications Prior to Admission medications   Medication Sig Start Date End Date Taking? Authorizing Provider  acetaminophen (TYLENOL) 500 MG tablet Take 1,000 mg by mouth every 8 (eight) hours as needed for mild pain or headache.    [provider]  aspirin EC 81 MG tablet Take 81 mg by mouth every morning.    [provider]  Besifloxacin HCl (BESIVANCE) 0.6 % SUSP Apply 1 drop to eye See admin instructions. Only uses before eye shot he receives every 10 weeks.    [provider]  Cholecalciferol (VITAMIN D-3) 1000 UNITS CAPS Take 1 capsule by mouth every morning.    [provider]  docusate sodium (COLACE) 100 MG capsule Take 300 mg by mouth daily.     [provider]  furosemide (LASIX) 40 MG tablet Take 1 tablet by mouth daily. 12/04/18   [provider]  Hyprom-Naphaz-Polysorb-Zn Sulf (CLEAR EYES COMPLETE OP) Apply 1-2 drops to eye daily as needed (dry eyes).    [provider]  levothyroxine (SYNTHROID) 25 MCG tablet Take 25 mcg by mouth every morning. 04/07/19   [provider]  metoprolol succinate (TOPROL-XL) 12.5 mg TB24 24 hr tablet Take 12.5 mg by mouth daily.    [provider]  Multiple Vitamins-Minerals (VITEYES AREDS ADVANCED PO) Take 2 capsules by mouth daily.    [provider]  pantoprazole (PROTONIX) 40 MG tablet Take 1 tablet (40 mg total) by mouth daily. 05/05/19 06/04/19  Donne Hazel, MD  polyethylene glycol powder (GLYCOLAX/MIRALAX) 17 GM/SCOOP powder Take 17 g by mouth daily as needed for mild constipation. Takes 17grams (1 capful) as needed. 12/12/18   Mariel Aloe, MD  ULORIC 40 MG tablet Take 1 tablet by mouth daily. 11/24/15   [provider]  Allergies    Patient has  no known allergies.  Review of Systems   Review of Systems  Constitutional: Negative for chills and fever.  HENT: Negative for congestion, rhinorrhea and sore throat.   Eyes: Negative for visual disturbance.  Respiratory: Negative for cough and shortness of breath.   Cardiovascular: Negative for chest pain and leg swelling.  Gastrointestinal: Positive for nausea and vomiting. Negative for abdominal pain and diarrhea.  Genitourinary: Negative for dysuria.  Musculoskeletal: Negative for back pain and neck pain.  Skin: Negative for rash.  Neurological: Positive for syncope. Negative for dizziness, light-headedness and headaches.  Hematological: Does not bruise/bleed easily.  Psychiatric/Behavioral: Negative for confusion.    Physical Exam Updated Vital Signs BP (!) 150/63   Pulse 60   Temp 97.7 F (36.5 C) (Oral)   Resp 12   Ht 1.702 m (5\' 7" )   Wt 64 kg   SpO2 100%   BMI 22.10 kg/m   Physical Exam Vitals and nursing note reviewed.  Constitutional:      General: He is not in acute distress.    Appearance: Normal appearance. He is well-developed.  HENT:     Head: Normocephalic and atraumatic.     Mouth/Throat:     Mouth: Mucous membranes are dry.  Eyes:     Extraocular Movements: Extraocular movements intact.     Conjunctiva/sclera: Conjunctivae normal.     Pupils: Pupils are equal, round, and reactive to light.  Cardiovascular:     Rate and Rhythm: Normal rate and regular rhythm.     Heart sounds: No murmur heard.   Pulmonary:     Effort: Pulmonary effort is normal. No respiratory distress.     Breath sounds: Normal breath sounds.  Abdominal:     Palpations: Abdomen is soft.     Tenderness: There is no abdominal tenderness.     Comments: Patient with ileal conduit in his right lower quadrant.  Then he has a percutaneous ureteral tube that goes to the right CVA flank area.  Genitourinary:    Comments: Penis normal.  Tip of it at the urethra has a little bit of  pinkness but no gross blood.  I think this is more of a skin irritation. Musculoskeletal:        General: No swelling.     Cervical back: Normal range of motion and neck supple.  Skin:    General: Skin is warm and dry.     Capillary Refill: Capillary refill takes 2 to 3 seconds.  Neurological:     Mental Status: He is alert. Mental status is at baseline.     Comments: Daughter states that he is forgetful.  But he is at baseline.  Patient moving all 4 extremities.  Lower extremities upper extremities no signs of any weakness no facial droop.  No obvious visual changes.  Cranial nerves seem to be intact.  Patient will verbalize.  Will follow commands.  Patient was not ambulated.     ED Results / Procedures / Treatments   Labs (all labs ordered are listed, but only abnormal results are displayed) Labs Reviewed  CBC WITH DIFFERENTIAL/PLATELET - Abnormal; Notable for the following components:      Result Value   RBC 3.24 (*)    Hemoglobin 10.4 (*)    HCT 32.3 (*)    Eosinophils Absolute 0.8 (*)    All other components within normal limits  COMPREHENSIVE METABOLIC PANEL - Abnormal; Notable for the following components:   Glucose, Bld  120 (*)    Creatinine, Ser 3.11 (*)    Calcium 8.5 (*)    Albumin 3.0 (*)    GFR, Estimated 18 (*)    All other components within normal limits  CBG MONITORING, ED - Abnormal; Notable for the following components:   Glucose-Capillary 107 (*)    All other components within normal limits  LIPASE, BLOOD    EKG EKG Interpretation  Date/Time:  Thursday Aug 17 2020 17:10:00 EDT Ventricular Rate:  63 PR Interval:  70 QRS Duration: 201 QT Interval:  489 QTC Calculation: 501 R Axis:   -19 Text Interpretation: AV dual-paced rhythm Ventricular premature complex Short PR interval Left bundle branch block Confirmed by Fredia Sorrow [phone]) on 08/17/2020 6:45:25 PM   Radiology No results found.  Procedures Procedures   Medications Ordered in  ED Medications  0.9 %  sodium chloride infusion ( Intravenous New Bag/Given 08/17/20 1840)  ondansetron (ZOFRAN) injection 4 mg (4 mg Intravenous Given 08/17/20 1850)    ED Course  I have reviewed the triage vital signs and the nursing notes.  Pertinent labs & imaging results that were available during my care of the patient were reviewed by me and considered in my medical decision making (see chart for details).    MDM Rules/Calculators/A&P                          Work-up here patient given Zofran.  With no further vomiting or dry heaving or feeling nauseated.  Normal leukocytosis.  Abdomen nontender.  Did not check urine would appear infected.  But clinically patient does not show any signs of infection no fever.  Blood pressure normal.  Respirations are 16-20. Blood sugar was 120.  Labs are significant for BUN of 115 creatinine 3.11 giving him a GFR of 18.  A year ago in March patient had a GFR of 23 so is not significantly changed.  Potassium is normal at 4.0.  So no evidence of any hyperkalemia or any acute kidney change.  Patient did receive some IV fluids here.  Does need to follow-up with primary care doctor to have his kidney function rechecked.  Liver function test are normal.  Lipase was normal.  Long conversation with patient's daughter.  I think he is stable for discharge home.  Do not think that there was a TIA or CVA I think this was secondary to the syncopal episode in the vasovagal event causing some abnormal brain activity.  Patient quite normal here.  However daughter will bring him back if there is any recurrent evidence of that.  He will need follow-up with his primary care doctor to have his kidney function rechecked.  Final Clinical Impression(s) / ED Diagnoses Final diagnoses:  None    Rx / DC Orders ED Discharge Orders    None       Fredia Sorrow, MD 08/17/20 2349

## 2020-08-17 NOTE — Discharge Instructions (Addendum)
Follow-up with his doctor if possible to have renal function rechecked.  Patient was hydrated here.  Suspect that the event was all related to syncopal episode due to vasovagal event.  While straining to have a bowel movement.  Certainly return for any recurrent symptoms.  Do not feel anything is consistent with a stroke presentation.

## 2020-08-18 ENCOUNTER — Encounter (HOSPITAL_COMMUNITY): Payer: Self-pay | Admitting: Emergency Medicine

## 2020-08-18 DIAGNOSIS — R55 Syncope and collapse: Secondary | ICD-10-CM | POA: Diagnosis not present

## 2020-08-18 DIAGNOSIS — Z743 Need for continuous supervision: Secondary | ICD-10-CM | POA: Diagnosis not present

## 2020-08-18 LAB — CBG MONITORING, ED: Glucose-Capillary: 72 mg/dL (ref 70–99)

## 2020-09-01 ENCOUNTER — Other Ambulatory Visit (HOSPITAL_COMMUNITY): Payer: No Typology Code available for payment source

## 2020-09-06 ENCOUNTER — Other Ambulatory Visit: Payer: Self-pay

## 2020-09-06 ENCOUNTER — Other Ambulatory Visit (HOSPITAL_COMMUNITY): Payer: Self-pay | Admitting: Interventional Radiology

## 2020-09-06 ENCOUNTER — Ambulatory Visit (HOSPITAL_COMMUNITY)
Admission: RE | Admit: 2020-09-06 | Discharge: 2020-09-06 | Disposition: A | Discharge status: Home / Self Care | Source: Ambulatory Visit | Attending: Interventional Radiology | Admitting: Interventional Radiology

## 2020-09-06 DIAGNOSIS — N135 Crossing vessel and stricture of ureter without hydronephrosis: Secondary | ICD-10-CM | POA: Insufficient documentation

## 2020-09-06 DIAGNOSIS — Z436 Encounter for attention to other artificial openings of urinary tract: Secondary | ICD-10-CM | POA: Diagnosis present

## 2020-09-06 HISTORY — PX: IR NEPHROSTOMY EXCHANGE RIGHT: IMG6070

## 2020-09-06 IMAGING — XA IR EXCHANGE NEPHROSTOMY RIGHT
3 series · 10 of 10 positions shown · non-contrast
Comparison: None.

INDICATION: Chronic indwelling right-sided nephrostomy tube requiring routine
exchange.

EXAM:
RIGHT PERCUTANEOUS NEPHROSTOMY TUBE EXCHANGE UNDER FLUOROSCOPY

[Series 1: fl - angio · 4 of 40 frames shown (1 of 2)]
[frame 7/40]
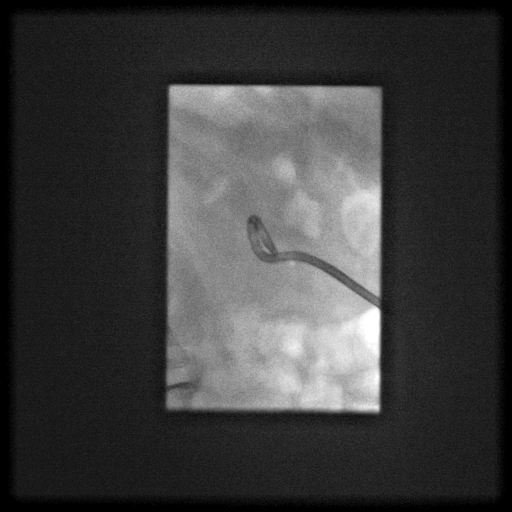
[frame 10/40]
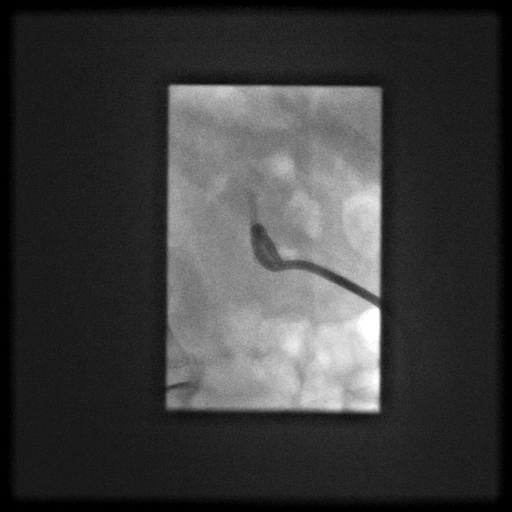
[frame 21/40]
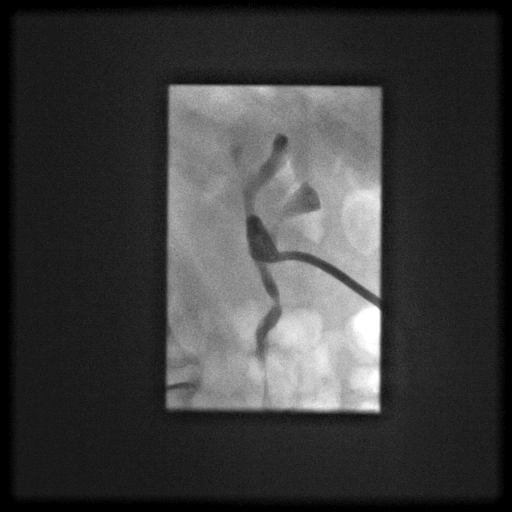
[frame 35/40]
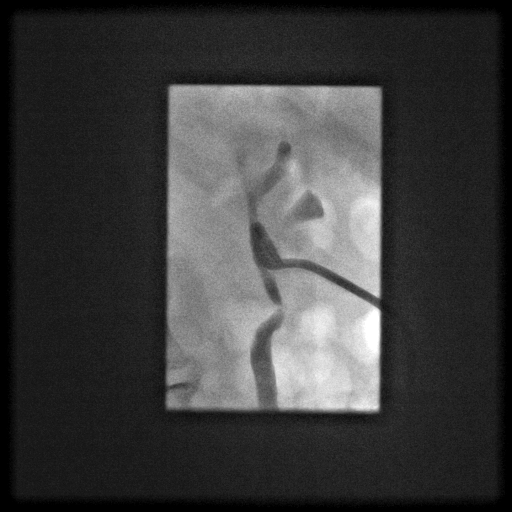

[Series 2: fl - angio · 4 of 27 frames shown (2 of 2)]
[frame 5/27]
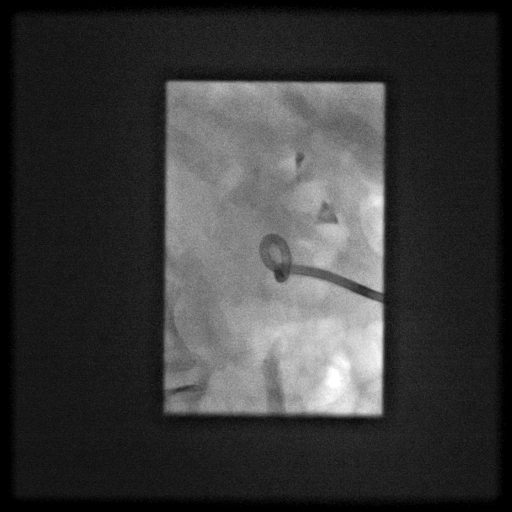
[frame 10/27]
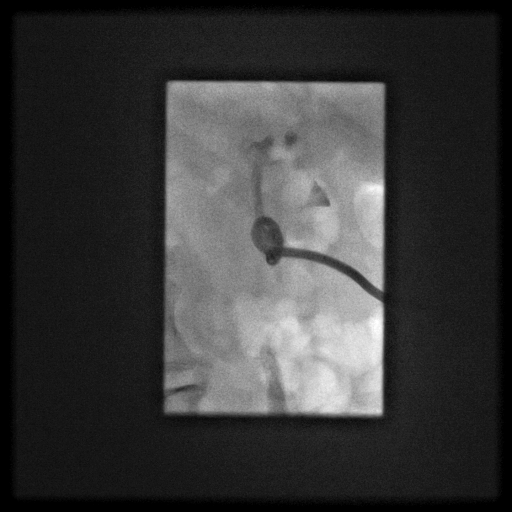
[frame 14/27]
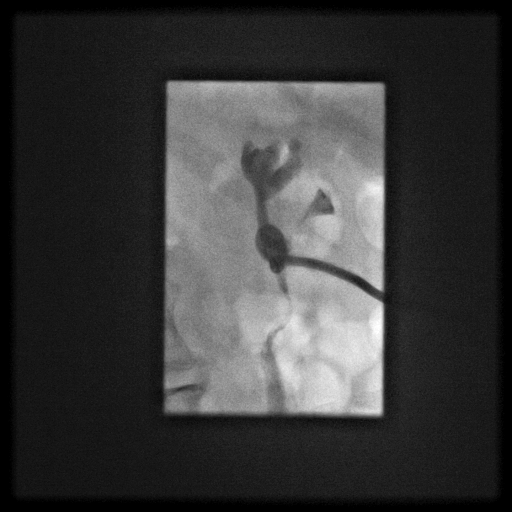
[frame 23/27]
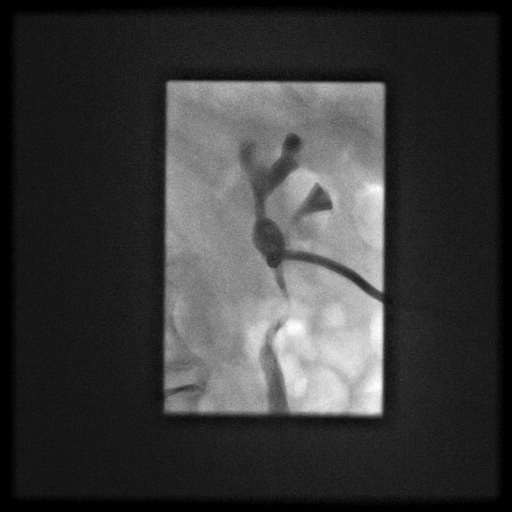

[Series 300: tube placements · 2 of 2 slices shown]
[im 1/2]
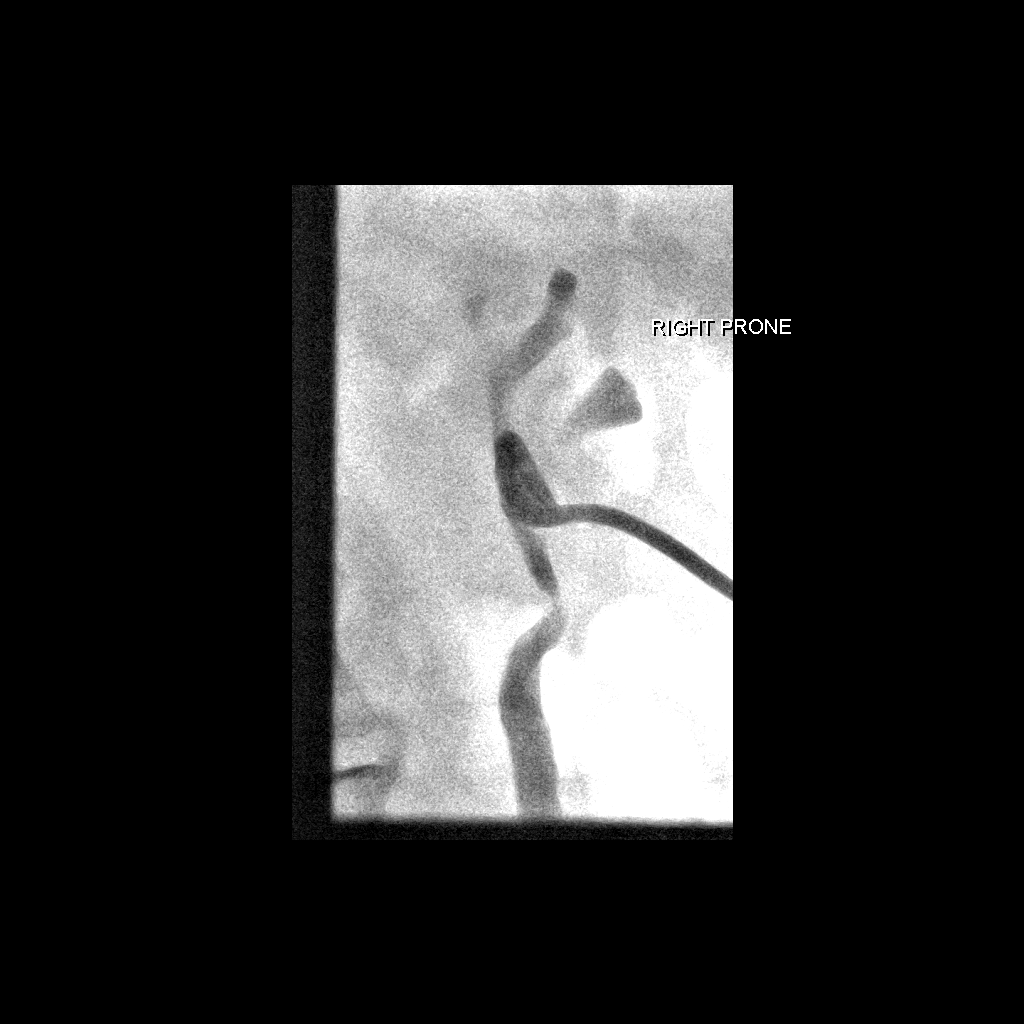
[im 2/2]
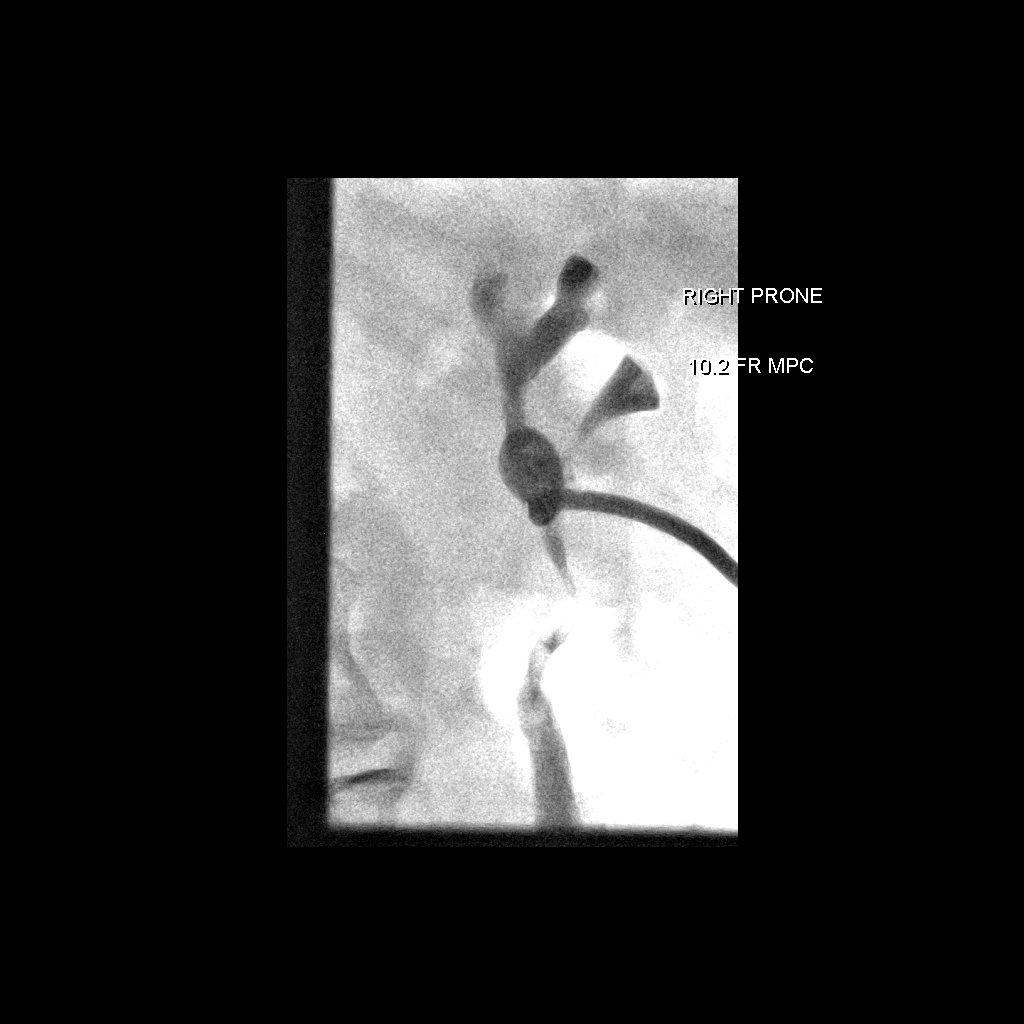

[10 of 10 positions shown; findings below may reference images not displayed]

MEDICATIONS:
None

ANESTHESIA/SEDATION:
None

CONTRAST:  10 mL Omnipaque 300-administered into the collecting
system(s)

FLUOROSCOPY TIME:  Fluoroscopy Time: 42 seconds.  10.3 mGy.

COMPLICATIONS:
None immediate.

PROCEDURE:
Informed written consent was obtained from the patient after a
thorough discussion of the procedural risks, benefits and
alternatives. All questions were addressed. Maximal Sterile Barrier
Technique was utilized including caps, mask, sterile gowns, sterile
gloves, sterile drape, hand hygiene and skin antiseptic. A timeout
was performed prior to the initiation of the procedure.

The pre-existing nephrostomy tube was injected with contrast
material, cut and removed over a guidewire. A new 10 Kadin Celi
[place] pigtail drainage catheter was advanced over a guidewire and
formed in the renal pelvis. The catheter was injected with contrast
to confirm position and a fluoroscopic spot image obtained. The
catheter was secured with a StatLock device and connected to a
gravity drainage bag.
FINDINGS: The new nephrostomy tube was formed in the renal pelvis.
IMPRESSION: Routine exchange of chronic indwelling right percutaneous
nephrostomy tube.

## 2020-09-06 MED ORDER — IOHEXOL 300 MG/ML  SOLN
10.0000 mL | Freq: Once | INTRAMUSCULAR | Status: AC | PRN
  Administered 2020-09-06: 8 mL

## 2020-09-26 IMAGING — DX DG CHEST 1V PORT
1 series · 1 of 1 positions shown · non-contrast
Comparison: Radiograph 02/04/2014

CLINICAL DATA: CHF, shortness of breath worse when laying down

EXAM:
PORTABLE CHEST 1 VIEW

[chest ap]
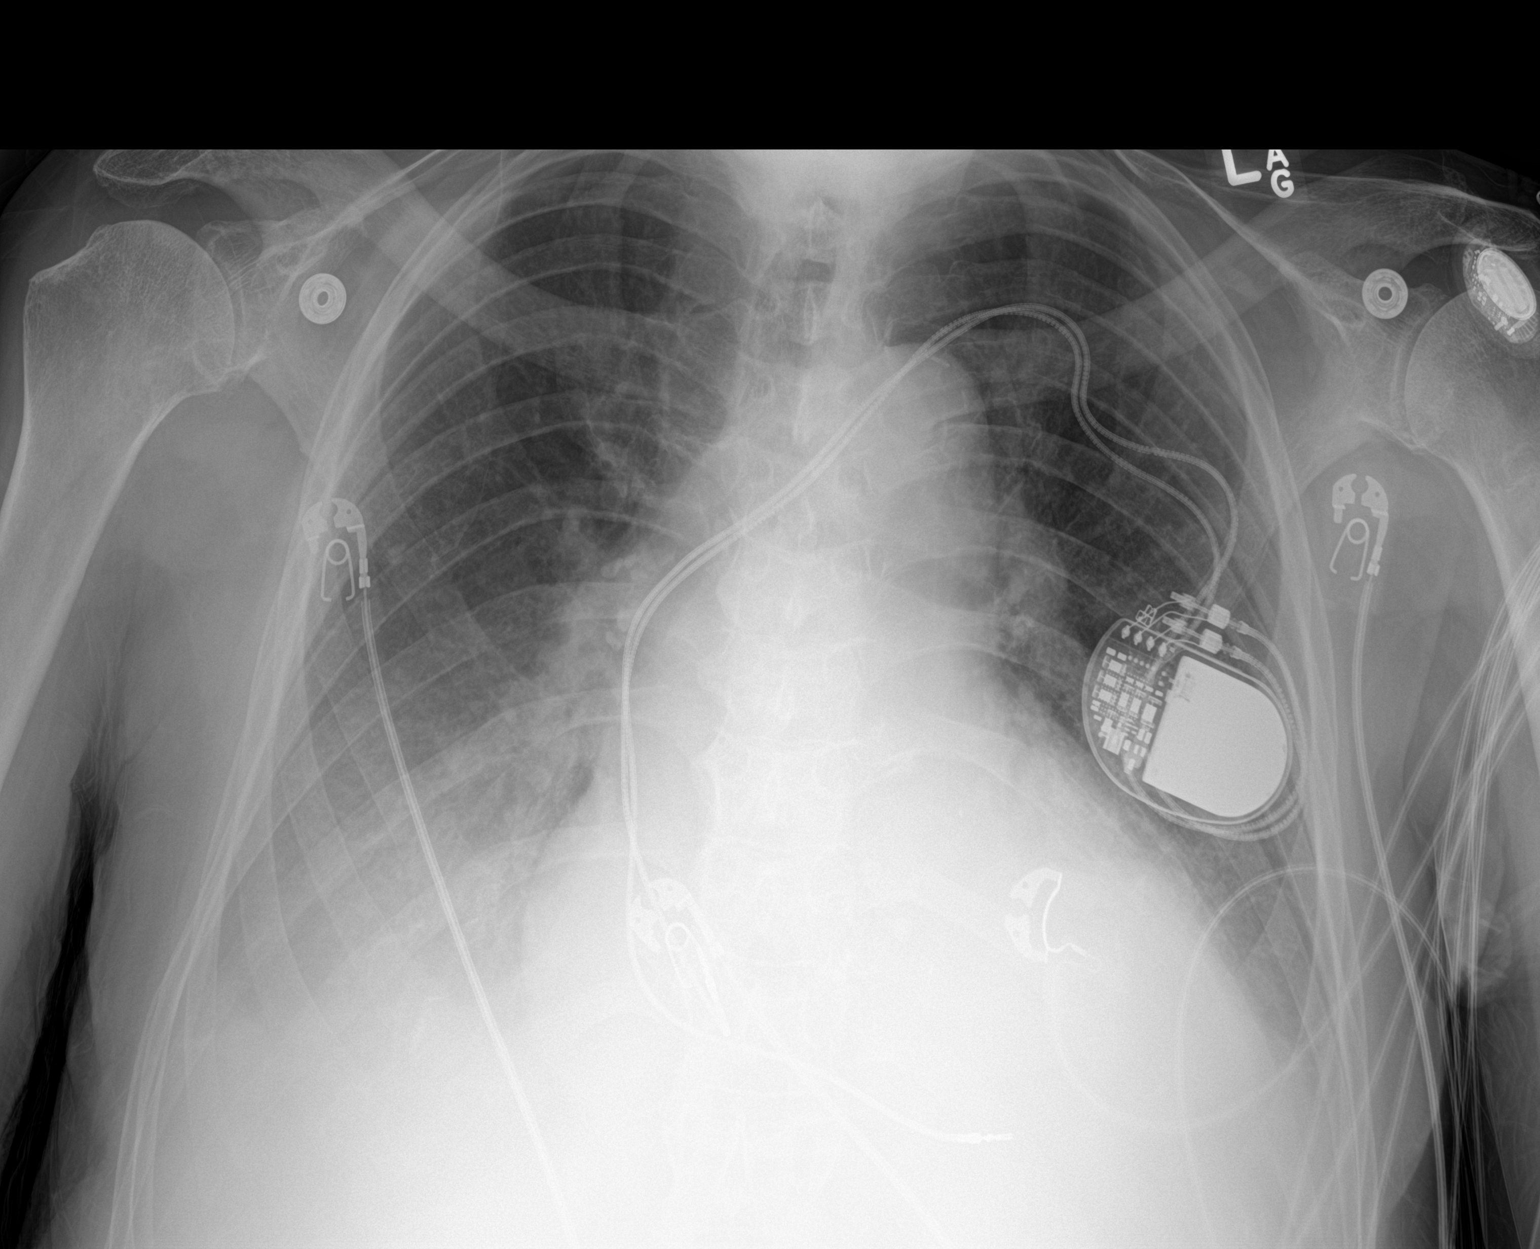

[1 of 1 positions shown; findings below may reference images not displayed]

FINDINGS: Dual lead pacer pack overlies the left chest wall with leads in the
right atrium and cardiac apex. Cardiomegaly. Bilateral basilar
predominant hazy interstitial opacities. Gradient density towards
the bases with obscuration of the hemidiaphragm suggest layering
effusion. No pneumothorax. No acute osseous or soft tissue
abnormality. Electronic device projects over the left shoulder.
IMPRESSION: Findings compatible with CHF including cardiomegaly, pulmonary edema
and likely layering effusions.

## 2020-10-20 ENCOUNTER — Inpatient Hospital Stay (HOSPITAL_COMMUNITY): Admission: RE | Admit: 2020-10-20 | Payer: No Typology Code available for payment source | Source: Ambulatory Visit

## 2020-10-20 ENCOUNTER — Other Ambulatory Visit (HOSPITAL_COMMUNITY): Payer: Self-pay | Admitting: Interventional Radiology

## 2020-10-23 IMAGING — XA IR EXCHANGE NEPHROSTOMY RIGHT
1 series · 2 of 2 positions shown · non-contrast
Comparison: None.

INDICATION: [AGE] with history of bladder cancer and chronic right
nephrostomy tube. Patient presents for routine nephrostomy tube
exchange.

EXAM:
EXCHANGE OF RIGHT NEPHROSTOMY TUBE WITH FLUOROSCOPY

[Series 300: ir nephrostomy exchange right · 2 of 2 slices shown]
[im 1/2]
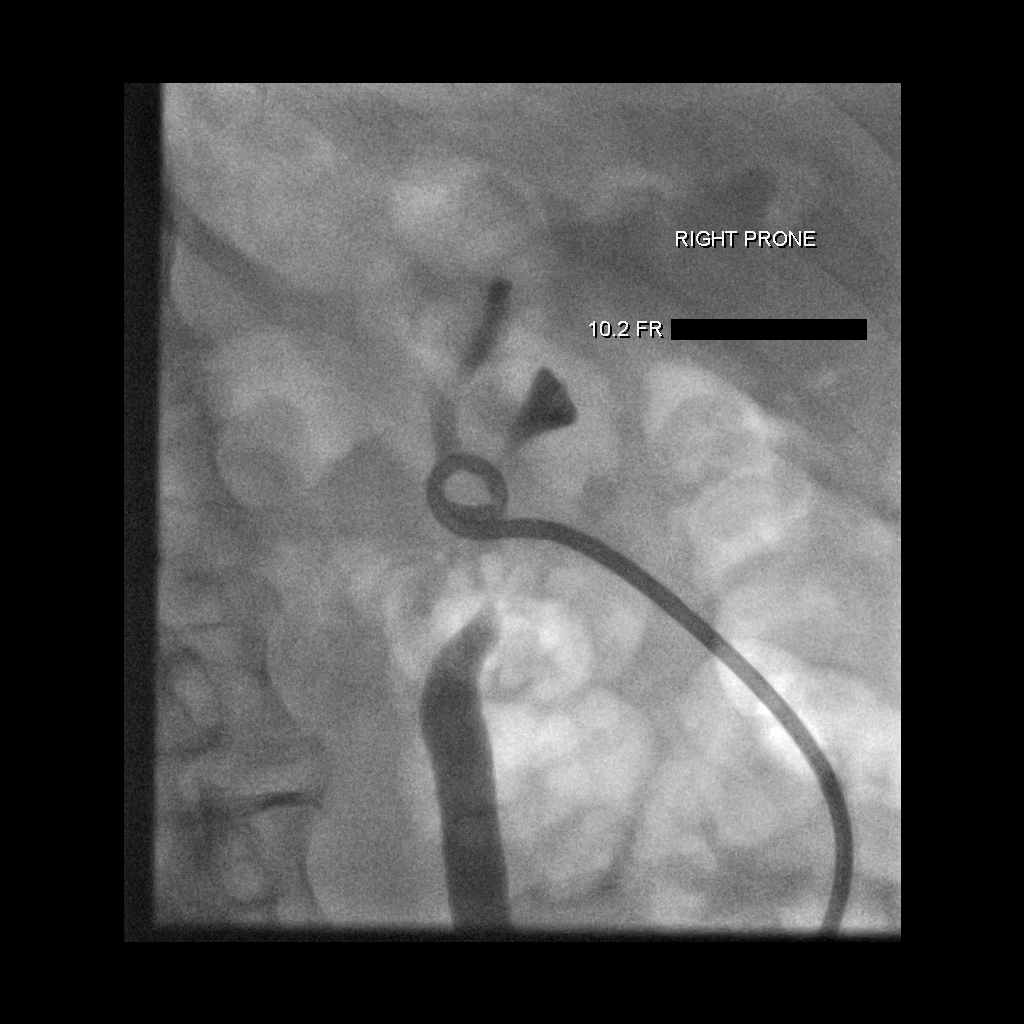
[im 2/2]
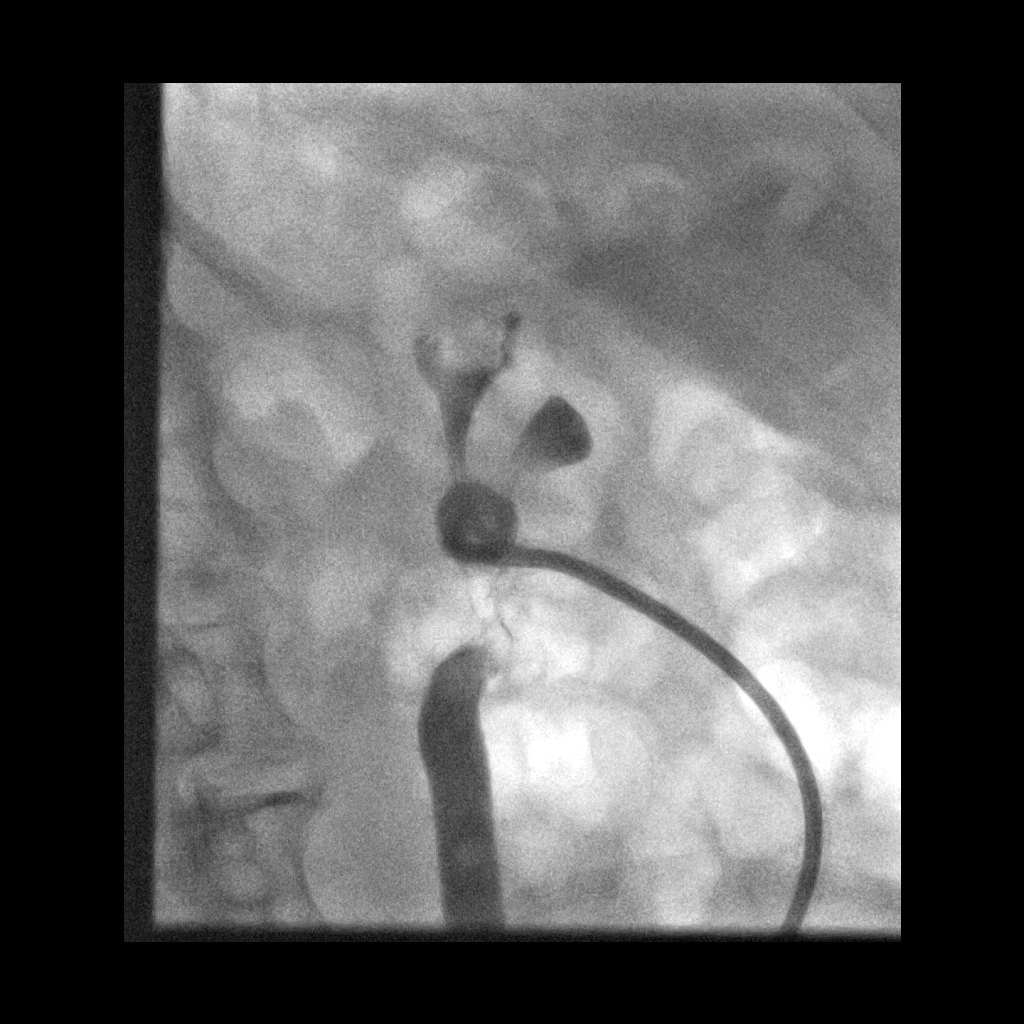

[2 of 2 positions shown; findings below may reference images not displayed]

MEDICATIONS:
None

ANESTHESIA/SEDATION:
None

CONTRAST:  8mL OMNIPAQUE IOHEXOL 300 MG/ML SOLN - administered into
the collecting system(s)

FLUOROSCOPY TIME:  Fluoroscopy Time: 48 seconds, 7.6 mGy

COMPLICATIONS:
None immediate.

PROCEDURE:
The procedure was explained to the patient. The risks and benefits
of the procedure were discussed and the patient's questions were
addressed. Informed consent was obtained from the patient. Patient
was placed prone.

The right flank and existing catheter were prepped and draped in
sterile fashion. Maximal barrier sterile technique was utilized
including caps, mask, sterile gowns, sterile gloves, sterile drape,
hand hygiene and skin antiseptic.

Contrast was injected through the catheter. The catheter was cut and
removed over a Bentson wire. New 10 Don Lolito Onacram catheter
was advanced over the wire and the tip was reconstituted in the
renal pelvis. Contrast injection confirmed placement in the
collecting system. Catheter was secured with a StatLock.

Fluoroscopic images were taken and saved for this procedure.
FINDINGS: 10 Don Lolito Onacram catheter is positioned in the right renal
pelvis.
IMPRESSION: Successful exchange of the right nephrostomy tube with fluoroscopy.

## 2020-10-27 ENCOUNTER — Inpatient Hospital Stay (HOSPITAL_COMMUNITY): Admission: RE | Admit: 2020-10-27 | Payer: No Typology Code available for payment source | Source: Ambulatory Visit

## 2020-10-30 ENCOUNTER — Other Ambulatory Visit: Payer: Self-pay

## 2020-10-30 ENCOUNTER — Ambulatory Visit (HOSPITAL_COMMUNITY)
Admission: RE | Admit: 2020-10-30 | Discharge: 2020-10-30 | Disposition: A | Discharge status: Home / Self Care | Source: Ambulatory Visit | Attending: Interventional Radiology | Admitting: Interventional Radiology

## 2020-10-30 DIAGNOSIS — Z436 Encounter for attention to other artificial openings of urinary tract: Secondary | ICD-10-CM | POA: Diagnosis present

## 2020-10-30 DIAGNOSIS — N135 Crossing vessel and stricture of ureter without hydronephrosis: Secondary | ICD-10-CM | POA: Insufficient documentation

## 2020-10-30 HISTORY — PX: IR NEPHROSTOMY EXCHANGE RIGHT: IMG6070

## 2020-10-30 MED ORDER — LIDOCAINE HCL 1 % IJ SOLN
INTRAMUSCULAR | Status: AC
  Filled 2020-10-30: qty 20

## 2020-10-30 MED ORDER — IOHEXOL 300 MG/ML  SOLN
50.0000 mL | Freq: Once | INTRAMUSCULAR | Status: AC | PRN
  Administered 2020-10-30: 20 mL

## 2020-10-31 ENCOUNTER — Other Ambulatory Visit (HOSPITAL_COMMUNITY): Payer: Self-pay | Admitting: Interventional Radiology

## 2020-10-31 DIAGNOSIS — N135 Crossing vessel and stricture of ureter without hydronephrosis: Secondary | ICD-10-CM

## 2020-11-25 DIAGNOSIS — Z936 Other artificial openings of urinary tract status: Secondary | ICD-10-CM | POA: Diagnosis not present

## 2020-11-25 DIAGNOSIS — C679 Malignant neoplasm of bladder, unspecified: Secondary | ICD-10-CM | POA: Diagnosis not present

## 2020-12-11 ENCOUNTER — Other Ambulatory Visit (HOSPITAL_COMMUNITY): Payer: No Typology Code available for payment source

## 2020-12-11 IMAGING — US IR NEPHROSTOMY PLACEMENT RIGHT
1 series · 1 of 1 positions shown · non-contrast
Comparison: Multiple previous fluoroscopic guided nephrostomy
catheter exchanges, most recently on 01/05/2019

INDICATION: History of bladder cancer with chronic right-sided nephrostomy
catheter. Patient presents today for routine fluoroscopic guided
exchange.

[Series 1: (id) · 1 of 1 slices shown]
[im 1/1]
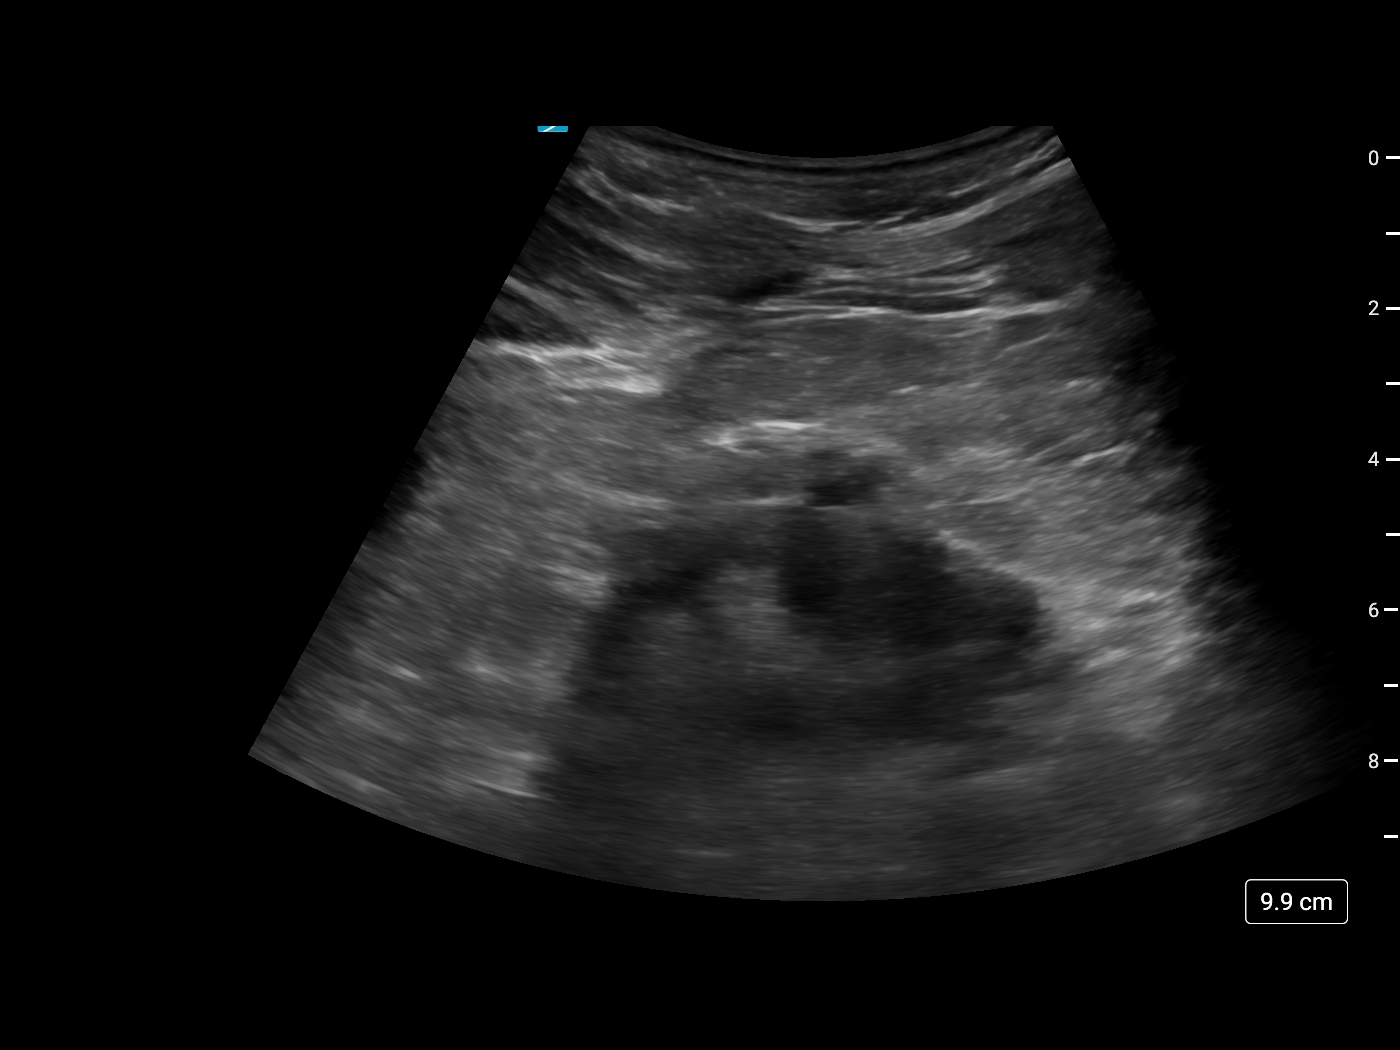

[1 of 1 positions shown; findings below may reference images not displayed]

Unfortunately, the patient reports little to no output from the
right-sided percutaneous nephrostomy catheter for the past week and
on physical examination it is apparent the nephrostomy catheter has
been removed entirely from the right flank.

EXAM:
1. ATTEMPTED FLUOROSCOPIC GUIDED RECANALIZATION OF CHRONIC
RIGHT-SIDED NEPHROSTOMY CATHETER TRACK
2. ULTRASOUND GUIDANCE FOR PUNCTURE OF THE RIGHT RENAL COLLECTING
SYSTEM
3. RIGHT PERCUTANEOUS NEPHROSTOMY TUBE PLACEMENT.
MEDICATIONS:
None

ANESTHESIA/SEDATION:
None

CONTRAST:  25 mL Isovue 300 administered into the collecting system

FLUOROSCOPY TIME:  12 minutes (198.9 mGy)

COMPLICATIONS:
None immediate.

PROCEDURE:
The procedure, risks, benefits, and alternatives were explained to
the patient. Questions regarding the procedure were encouraged and
answered. The patient understands and consents to the procedure. A
timeout was performed prior to the initiation of the procedure.

The right flank region was prepped with Betadine in a sterile
fashion, and a sterile drape was applied covering the operative
field. A sterile gown and sterile gloves were used for the
procedure.

A probe was placed at the location of the prior entrance site of the
right-sided nephrostomy catheter a spot fluoroscopic image was
obtained. Prolonged efforts were made to recanalize the right-sided
nephrostomy catheter track however it ultimately became apparent the
track had healed as the right renal collecting system was never
opacified.

As such, ultrasound was utilized to localize the right kidney
however only demonstrated very minimal right-sided pelvicaliectasis.

After lying soft tissues were anesthetized with 1% lidocaine with
epinephrine, the inferior aspect the right renal collecting system
was accessed with a 22 gauge Chiba needle. Contrast injection
confirmed appropriate positioning. A Nitrex wire was advanced into
the right renal collecting system and the track was dilated with the
inner 3 French catheter from the Accustick set. Next, a
posteroinferior calyx was targeted with an additional 22 gauge Chiba
needle allowing advancement of a Nitrex wire to the superior aspect
the right ureter.

Despite prolonged efforts, the Accustick catheter could not be
advanced through the accessed calyx to the level of the renal pelvis
and as such the decision was made to proceed with placement of a
nephrostomy catheter at the initial access site.

As such, a short Amplatz wire was coiled within the right renal
pelvis, the track was dilated and a 10 French nephrostomy catheter
was placed with end coiled and locked within the diminutive right
renal pelvis.

Postprocedural spot fluoroscopic and radiographic images were
obtained in various obliquities. The nephrostomy catheter was
secured to the skin entrance site within interrupted suture and
connected to a gravity bag. A dressing was applied. The patient
tolerated the procedure well without immediate postprocedural
complication.
FINDINGS: On physical inspection the entrance site of the prior nephrostomy
catheter had completely healed and despite prolonged efforts, the
subcutaneous track could not be recannulated.

Sonographic evaluation of the right kidney demonstrates very mild
right-sided pelvicaliectasis.

Ultimately, a posteroinferior calyx was accessed with ultrasound
guidance allowing placement of a new 10 French percutaneous
nephrostomy catheter with end coiled and locked within the
diminutive right renal pelvis.

Postprocedural spot fluoroscopic image demonstrates moderate
right-sided ureterectasis with tapered narrowing of the distal
aspect of the right ureter, however there is passage of contrast to
the level of the patient's ileal conduit which likely explains the
lack of significant upstream right-sided pelvicaliectasis rendering
percutaneous nephrostomy replacement challenging.
IMPRESSION: Successful ultrasound and fluoroscopic guided replacement of a new
right-sided 10 French percutaneous nephrostomy catheter.

PLAN:
- The patient was given flushes instructed to flush the nephrostomy
catheter with 10 cc of saline twice per day until the expected
postprocedural hematuria resolves.

- Recommend repeat fluoroscopic guided exchange in 6 weeks. At that
time consideration for conversion of the right-sided nephrostomy
catheter to a right-sided nephroureteral catheter (given apparent
patency of the distal ureteral anastomosis) could be considered as
indicated.

## 2020-12-15 ENCOUNTER — Other Ambulatory Visit (HOSPITAL_COMMUNITY): Payer: No Typology Code available for payment source

## 2020-12-25 ENCOUNTER — Other Ambulatory Visit (HOSPITAL_COMMUNITY): Payer: No Typology Code available for payment source

## 2021-01-02 DIAGNOSIS — C679 Malignant neoplasm of bladder, unspecified: Secondary | ICD-10-CM | POA: Diagnosis not present

## 2021-01-02 DIAGNOSIS — Z936 Other artificial openings of urinary tract status: Secondary | ICD-10-CM | POA: Diagnosis not present

## 2021-01-17 IMAGING — XA IR EXCHANGE NEPHROSTOMY RIGHT
1 series · 4 of 4 positions shown · non-contrast
Comparison: Image guided nephrostomy catheter placement-02/23/2019

INDICATION: Decreased output from recently replaced right-sided percutaneous
nephrostomy catheter. Patient is also nearly due for routine
fluoroscopic guided exchange. As such, patient presents today for
antegrade nephrostogram and fluoroscopic guided exchange.

EXAM:
FLUOROSCOPIC GUIDED RIGHT SIDED NEPHROSTOMY CATHETER EXCHANGE
TECHNIQUE: Informed written consent was obtained from the patient after a
discussion of the risks, benefits and alternatives to treatment.
Questions regarding the procedure were encouraged and answered. A
timeout was performed prior to the initiation of the procedure.

[Series 300: ir nephrostomy exchange right · 4 of 4 slices shown]
[im 1/4]
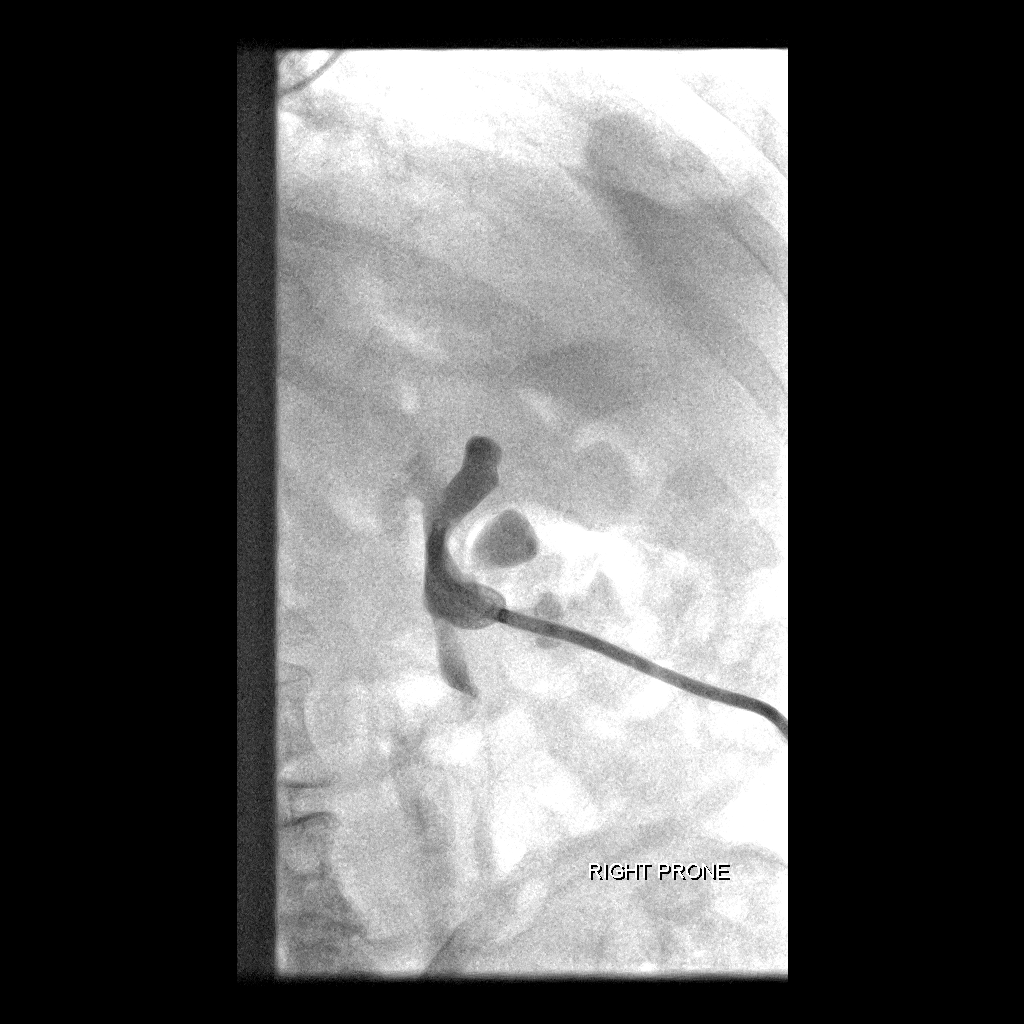
[im 2/4]
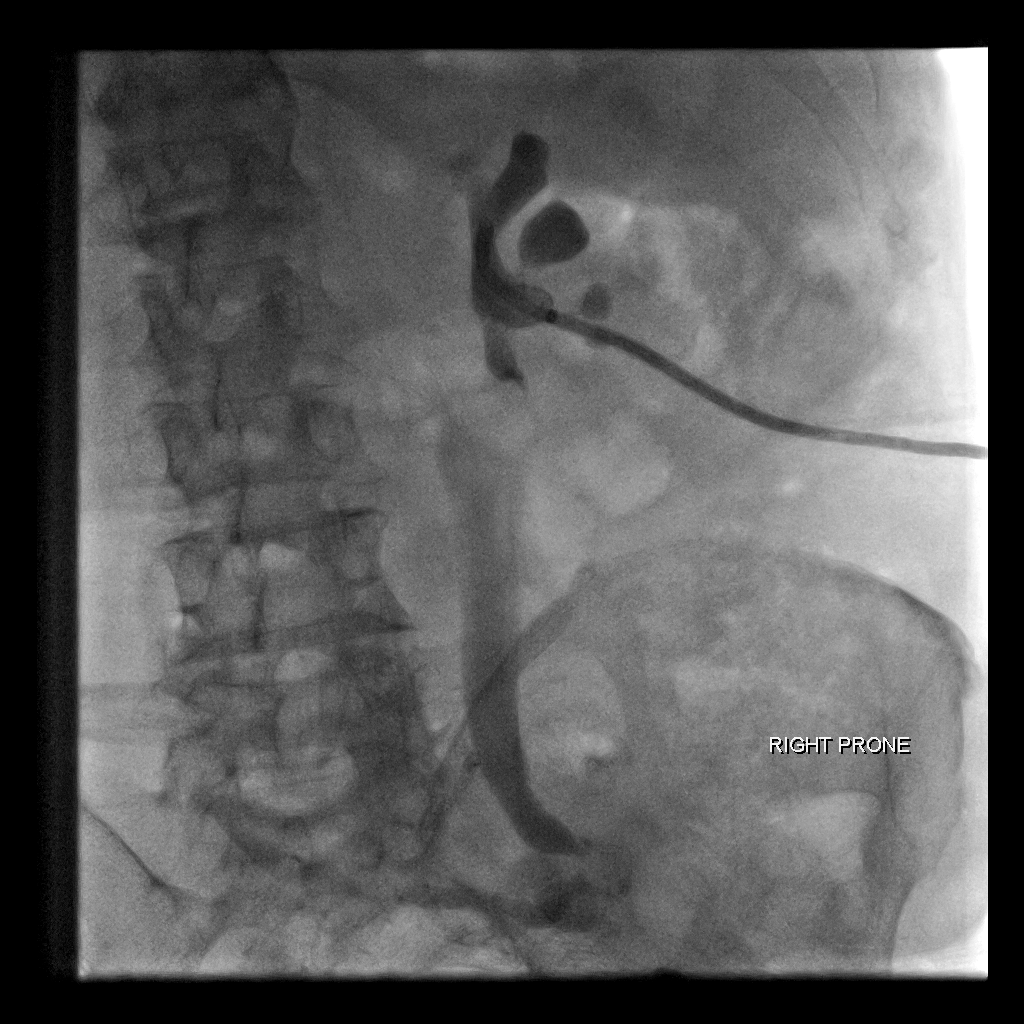
[im 3/4]
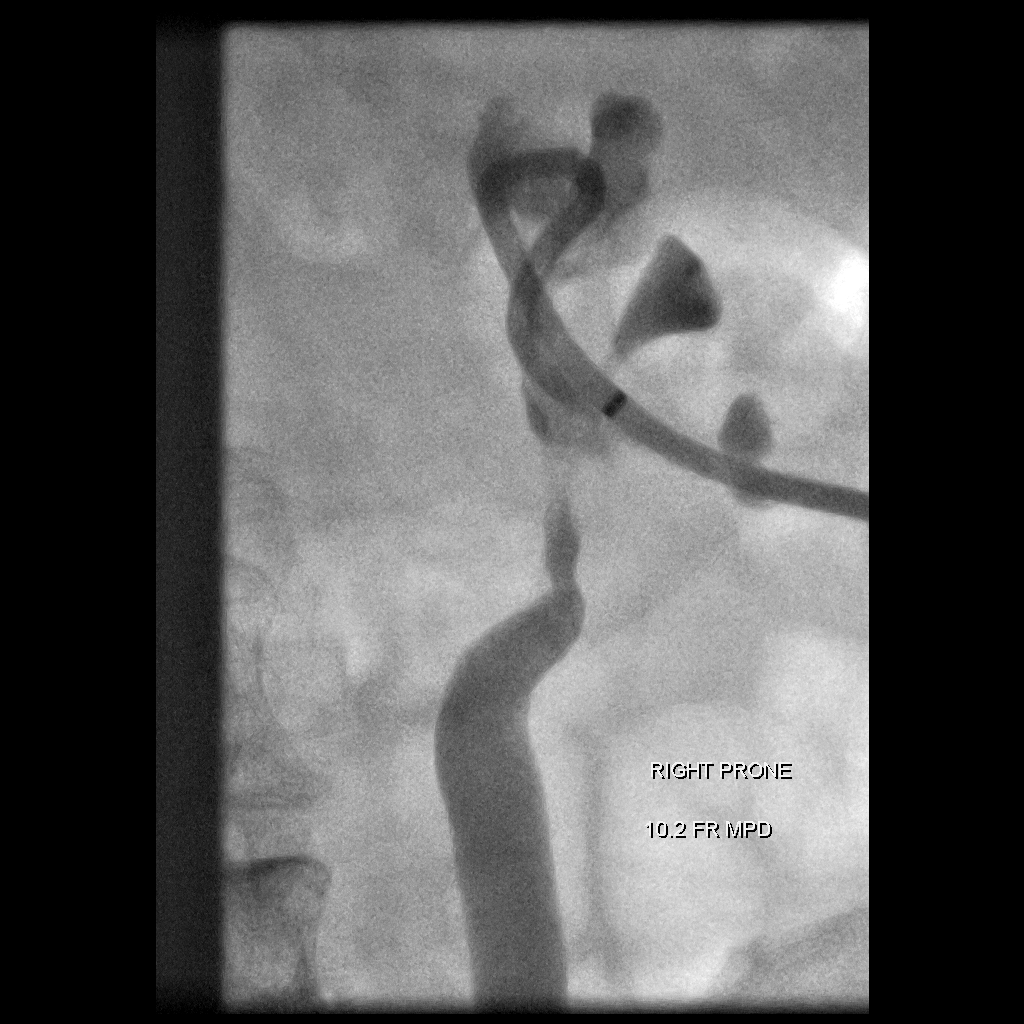
[im 4/4]
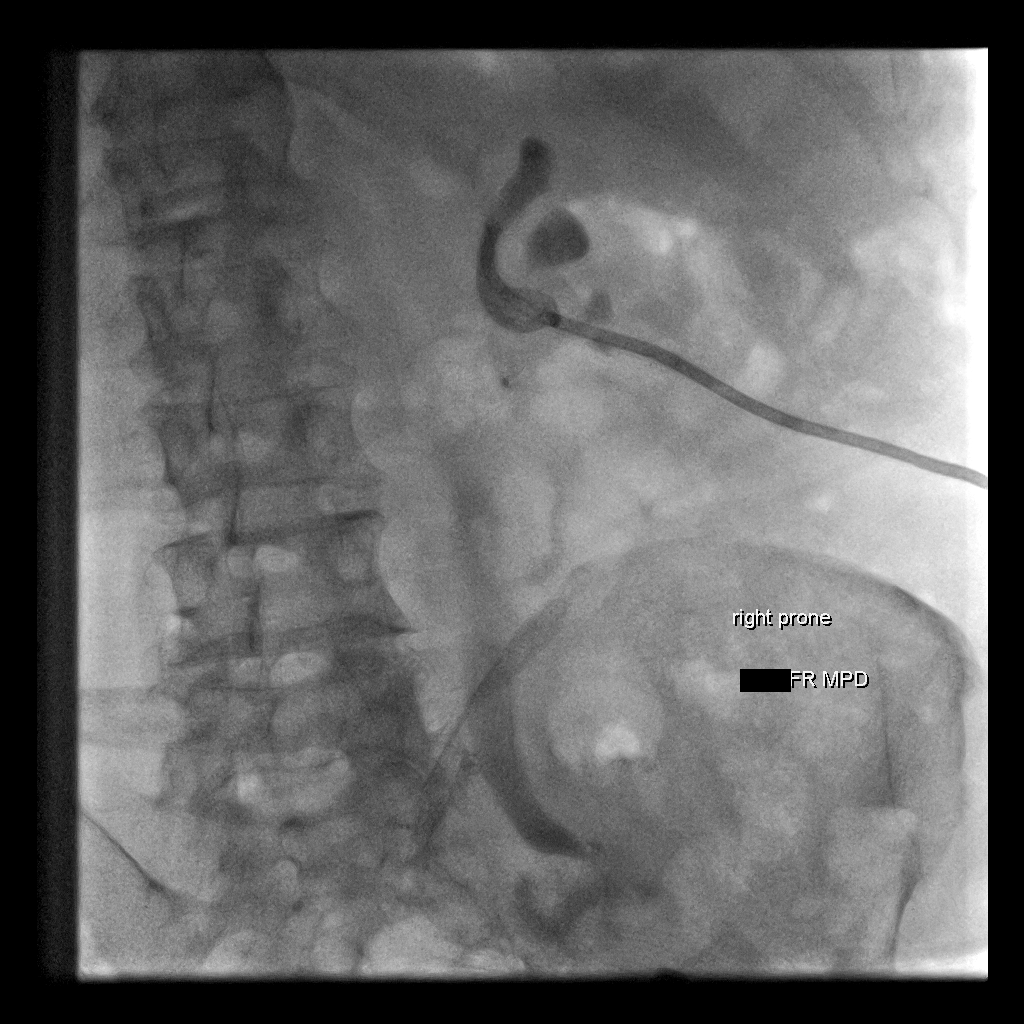

[4 of 4 positions shown; findings below may reference images not displayed]

CONTRAST:  10 cc Omnipaque 300 administered into the collecting
system

FLUOROSCOPY TIME:  1 minute, 24 seconds (27.8 mGy)

COMPLICATIONS:
None immediate.
The right flank and external portion of existing nephrostomy
catheter were prepped and draped in the usual sterile fashion. A
sterile drape was applied covering the operative field. Maximum
barrier sterile technique with sterile gowns and gloves were used
for the procedure. A timeout was performed prior to the initiation
of the procedure.

A pre procedural spot fluoroscopic image was obtained after contrast
was injected via the existing nephrostomy catheter demonstrating
appropriate positioning within the renal pelvis. The existing
nephrostomy catheter was cut and cannulated with a Benson wire which
was coiled within the renal pelvis. Under intermittent fluoroscopic
guidance, the existing nephrostomy catheter was exchanged for a new
10.2 French all-purpose drainage catheter. Contrast injection
confirmed appropriate positioning within the renal pelvis and a post
exchange fluoroscopic image was obtained. The catheter was locked
and secured to the skin with a suture. A dressing was placed. The
patient tolerated the procedure well without immediate
postprocedural complication.
FINDINGS: The existing nephrostomy catheter is appropriately positioned and
functioning. Note is again made of a diminutive right renal pelvis.

Antegrade nephrostogram demonstrates passage of contrast through the
right ureter to the level of the urostomy.

After successful fluoroscopic guided exchange, the new nephrostomy
catheter is coiled and locked within the right renal pelvis.
IMPRESSION: 1. Successful fluoroscopic guided exchange of right sided
French percutaneous nephrostomy catheter.
2. Patency of the distal right ureteral anastomosis.

PLAN:
Above findings discussed with the patient's daughter.

I explained that as the patient's ureteral anastomosis remains
patent, urine may drain both internally as well as external to the
gravity bag so she should not be alarmed if there is decreased
output in the gravity bag as long as the patient denies flank pain
and there is no pericatheter drainage.

Additionally, I explained that we could consider conversion of the
patient's right-sided percutaneous nephrostomy catheter to a
nephroureteral catheter, the benefits of which could include a trial
of capping/internalization as well as providing a longer catheter
with decreased risk of inadvertent removal of the catheter as
occurred in early [REDACTED]. My hesitation for performing conversion
to a nephroureteral catheter is the patient's history of
incrustation of previously existing catheters.

The patient's daughter stated she will discuss these options with
her father of let us know at the time of the patient's next exchange
in 4-6 weeks.

## 2021-01-19 DIAGNOSIS — R404 Transient alteration of awareness: Secondary | ICD-10-CM | POA: Diagnosis not present

## 2021-01-19 DIAGNOSIS — Z743 Need for continuous supervision: Secondary | ICD-10-CM | POA: Diagnosis not present

## 2021-01-23 DEATH — deceased

## 2021-02-10 IMAGING — US US ABDOMEN LIMITED
1 series · 14 of 25 positions shown · non-contrast
Comparison: CT Abdomen and Pelvis 8080 hours today.

CLINICAL DATA: [AGE] male with right upper quadrant abdominal
pain for 1 week. Cholelithiasis, choledocholithiasis, and right
pleural effusion on CT Abdomen and Pelvis today.

EXAM:
ULTRASOUND ABDOMEN LIMITED RIGHT UPPER QUADRANT

[Series 1: us abdomen limited · 14 of 64 slices shown]
[im 1/64]
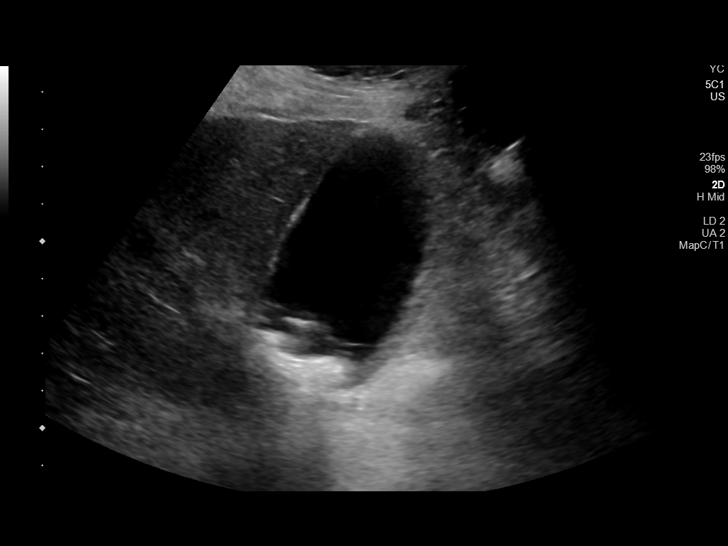
[im 6/64]
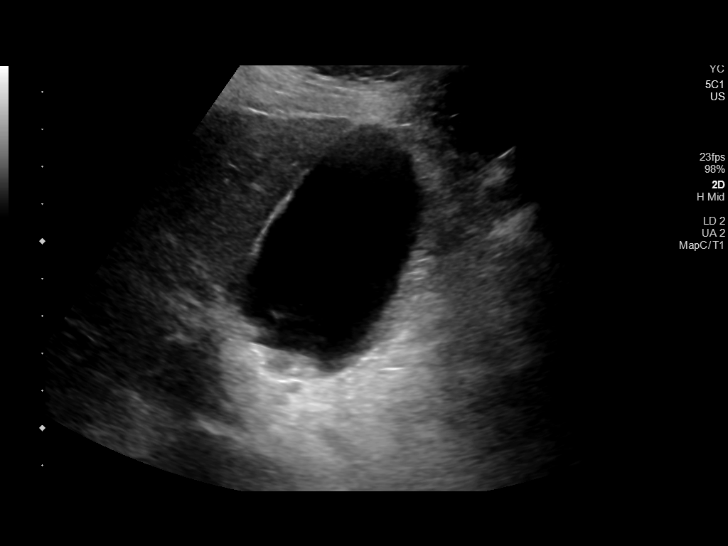
[im 11/64]
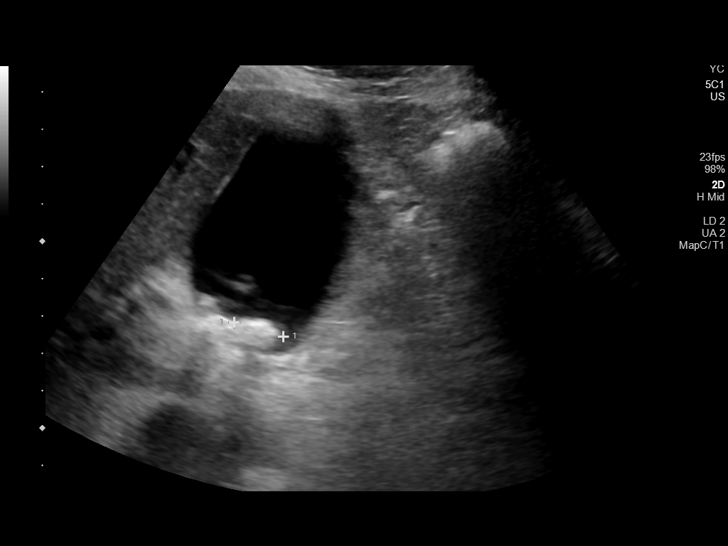
[im 16/64]
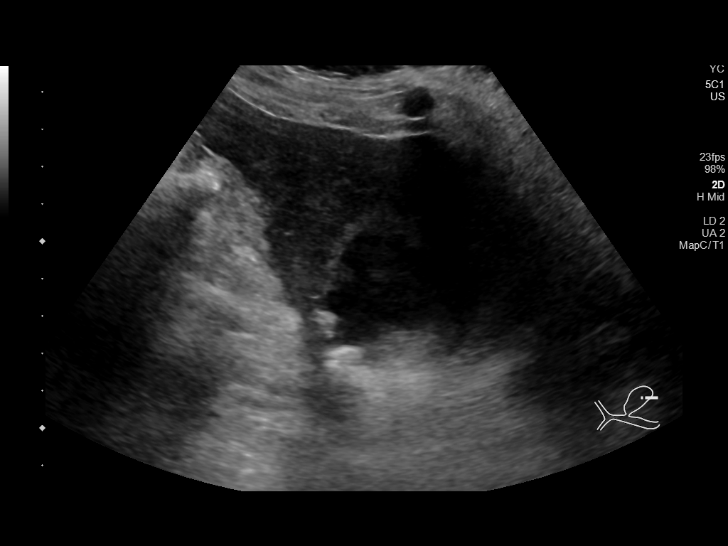
[im 22/64]
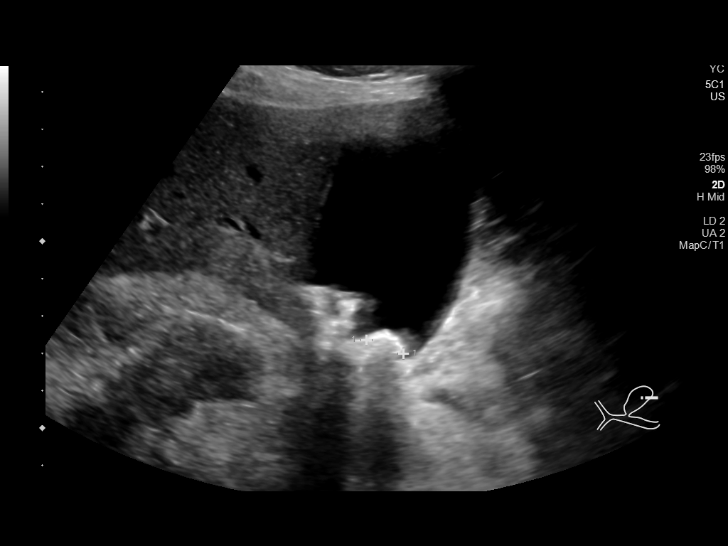
[im 24/64]
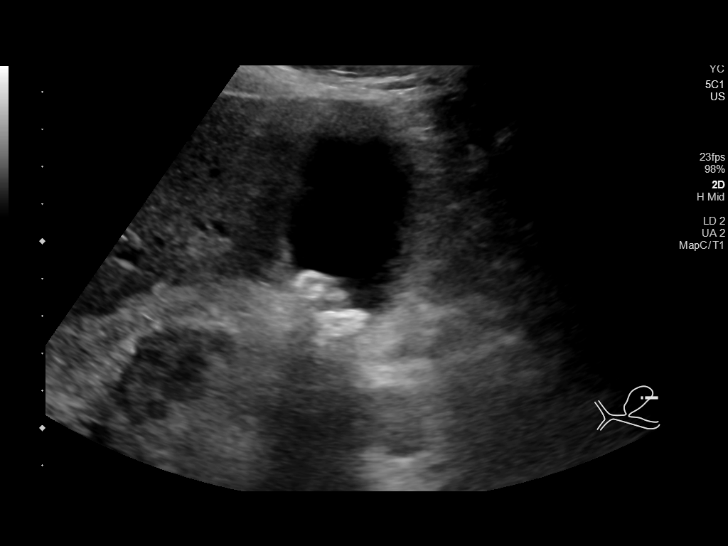
[im 29/64]
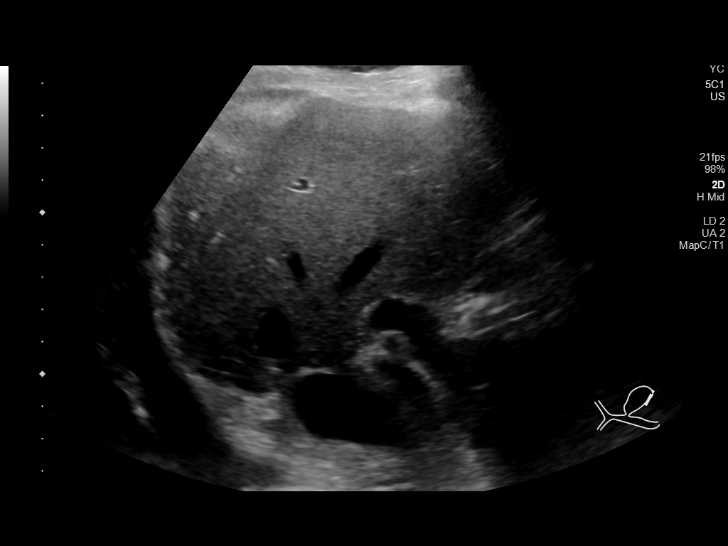
[im 35/64]
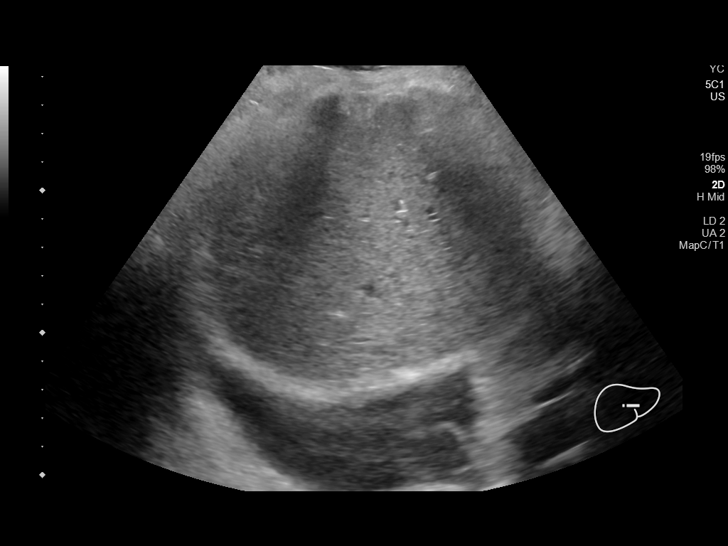
[im 40/64]
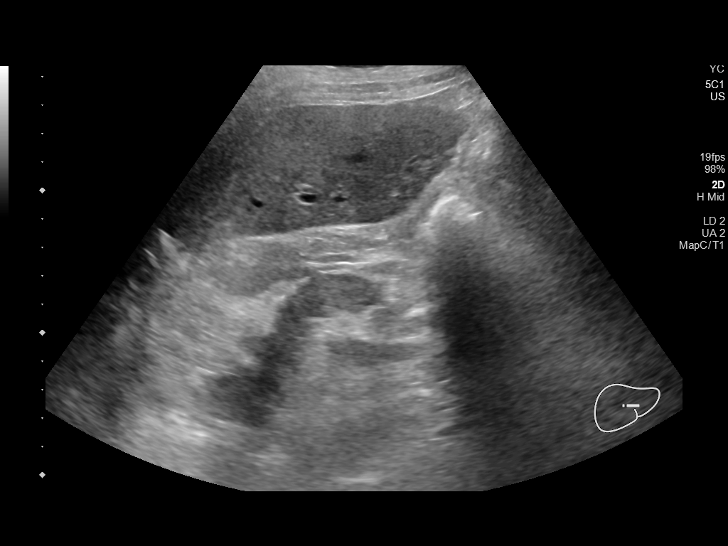
[im 43/64]
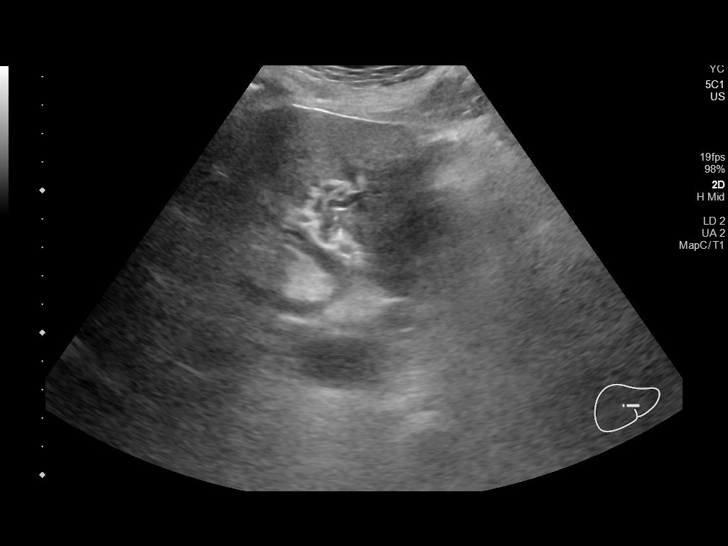
[im 48/64]
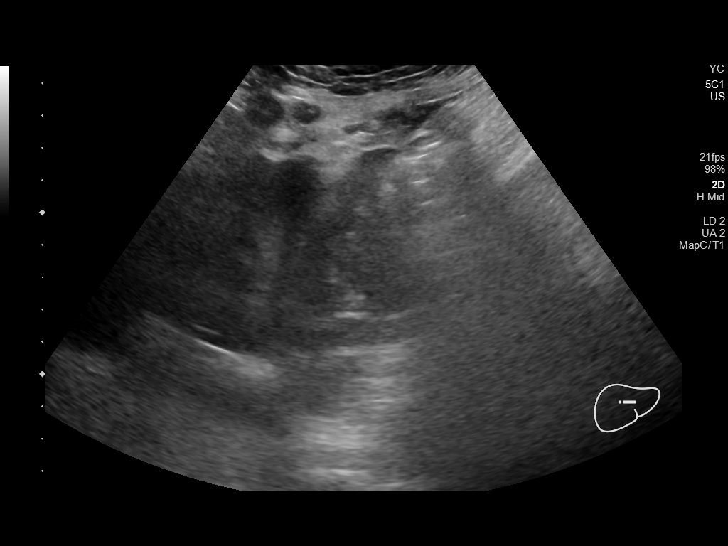
[im 53/64]
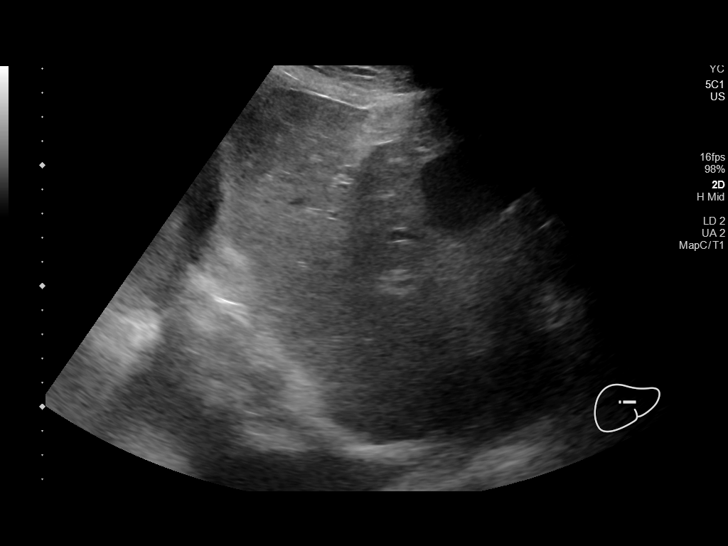
[im 58/64]
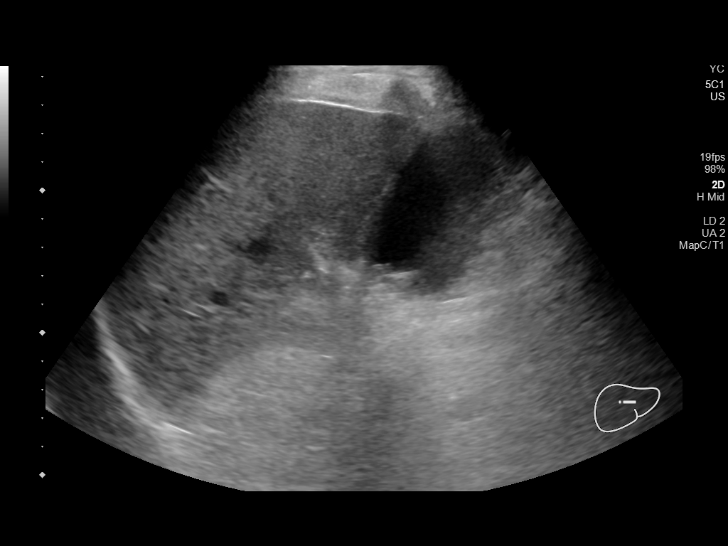
[im 64/64]
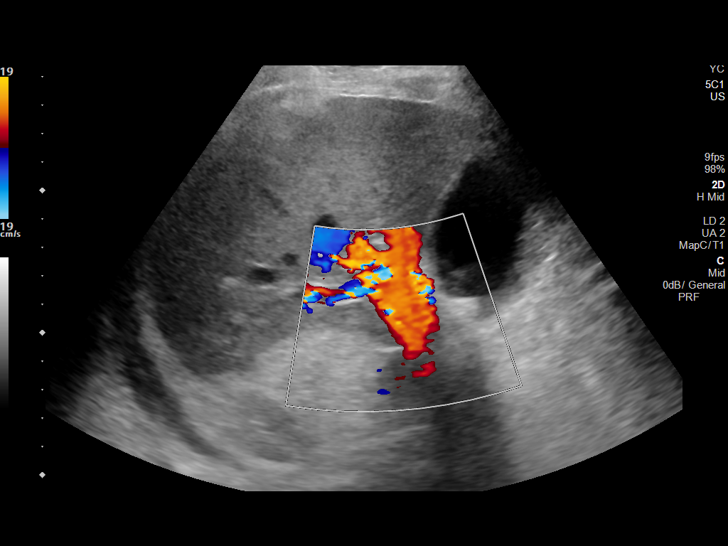

[14 of 25 positions shown; findings below may reference images not displayed]

FINDINGS: Gallbladder:

Gallbladder wall thickness remains normal at 1 to 2 millimeters.
Echogenic gallstones individually estimated at 14 millimeters (image
12). No pericholecystic fluid. No sonographic Murphy sign elicited.

Common bile duct:

Diameter: 13 millimeters, dilated. Cannot visualize the filling
defects within the ducts seen by CT today.

Liver:

Evidence of intrahepatic biliary ductal dilatation on image 48.
Background liver echogenicity within normal limits. No discrete
liver lesion. Portal vein is patent on color Doppler imaging with
normal direction of blood flow towards the liver.

Other: Right pleural effusion redemonstrated.
IMPRESSION: 1. Dilated intra- and extrahepatic biliary tree due to the
multifocal Choledocholithiasis demonstrated by CT today.
2. Cholelithiasis, no superimposed acute cholecystitis.
3. Right pleural effusion redemonstrated.

## 2021-02-10 IMAGING — DX DG CHEST 1V PORT
1 series · 1 of 1 positions shown · non-contrast
Comparison: 12/09/2018

CLINICAL DATA: Shortness of breath. Bradycardia.

EXAM:
PORTABLE CHEST 1 VIEW

[chest ap]
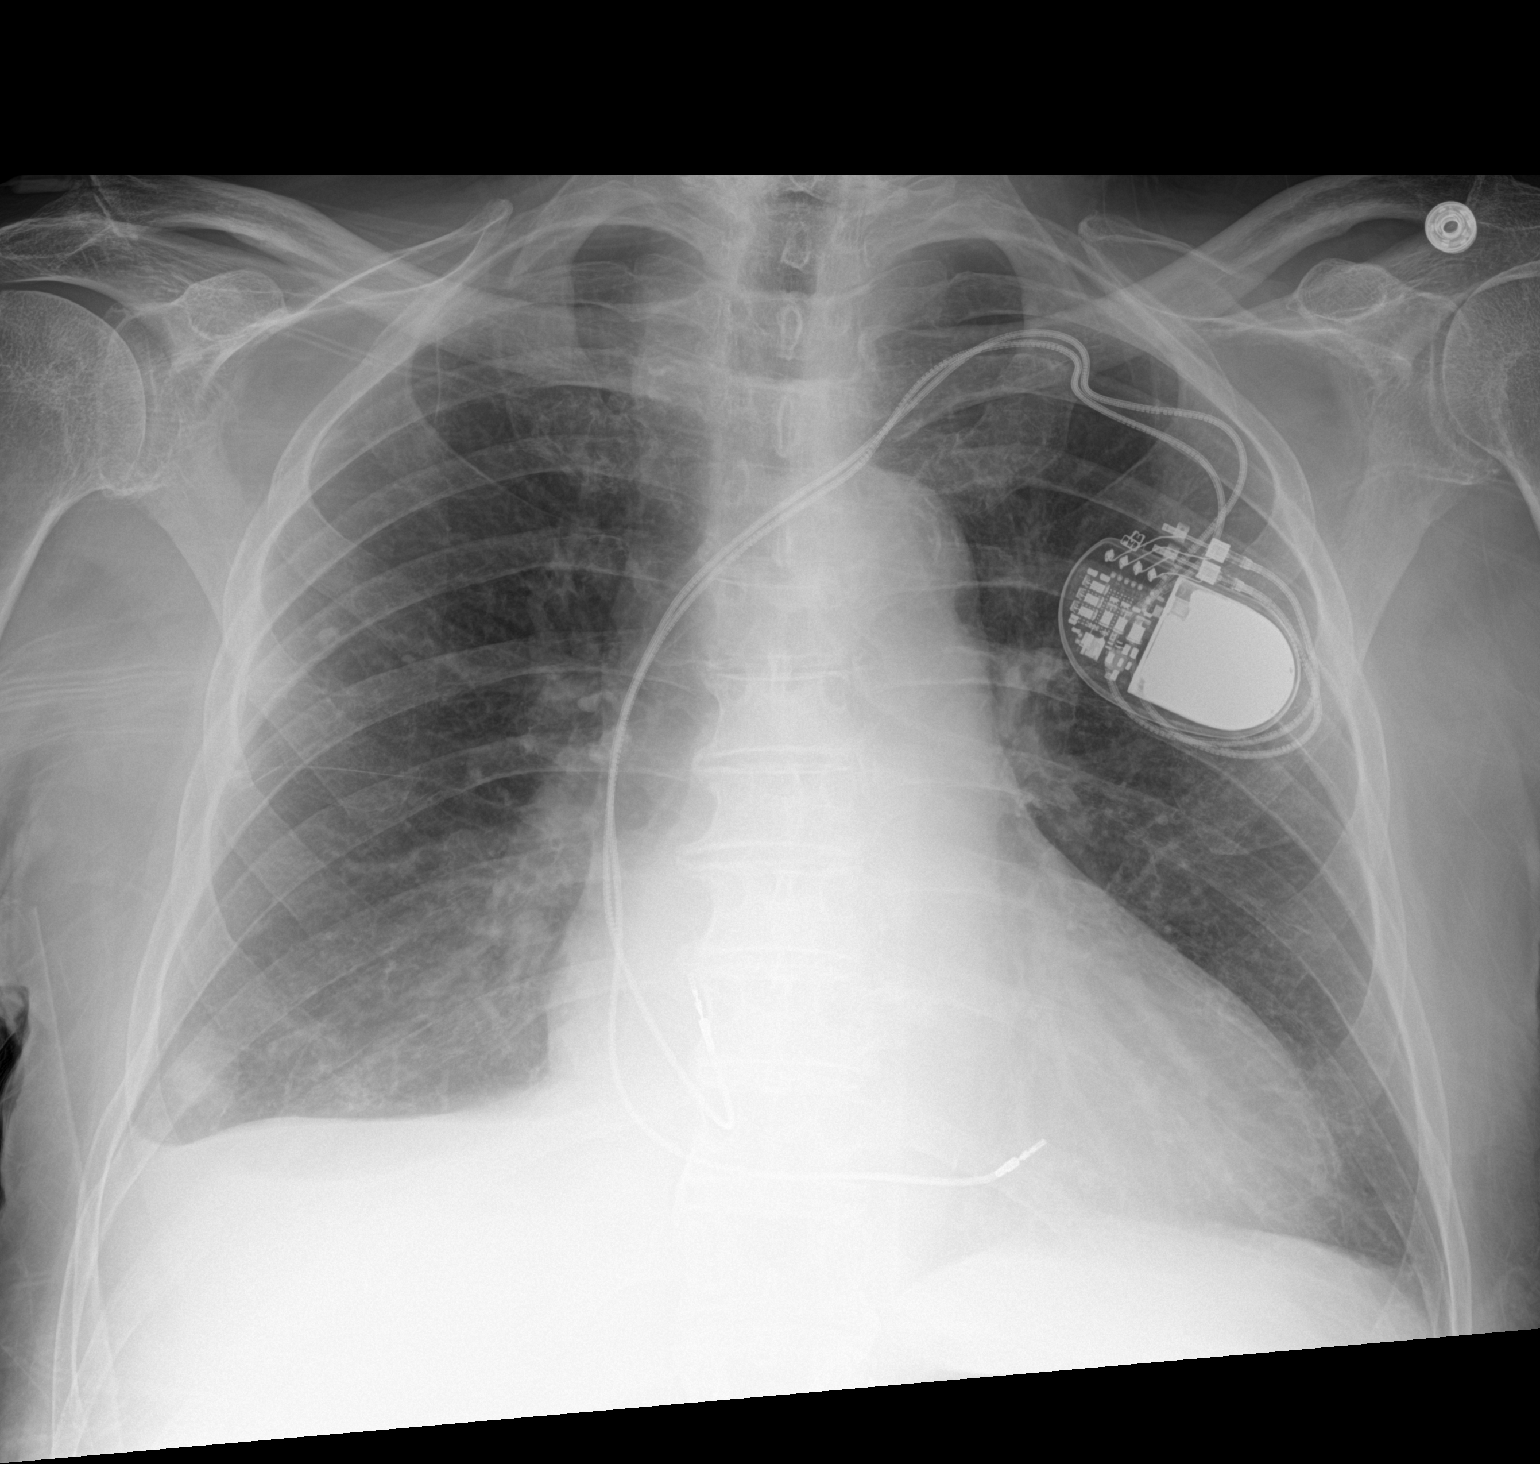

[1 of 1 positions shown; findings below may reference images not displayed]

FINDINGS: Mild cardiomegaly remains stable. Pacemaker remains in place.
Previously seen bibasilar pulmonary opacity is resolved since
previous study. A nodular opacity is seen in the lateral right lung
base.
IMPRESSION: 1. Nodular opacity in lateral right lung base. Recommend chest CT to
exclude pulmonary nodule.
2. Cardiomegaly.

## 2021-02-10 IMAGING — CT CT RENAL STONE PROTOCOL
2 of 4 series · 15 of 46 positions shown, 17 images · non-contrast
Comparison: June 25, 2017

CLINICAL DATA: Flank pain with kidney stones suspected

EXAM:
CT ABDOMEN AND PELVIS WITHOUT CONTRAST
TECHNIQUE: Multidetector CT imaging of the abdomen and pelvis was performed
following the standard protocol without IV contrast.

[Series 2: axial st · axial · 0.84mm/px · z∈[-470,-50]mm · 12 of 94 slices shown, 14 images]
[im 5/94  soft-tissue]
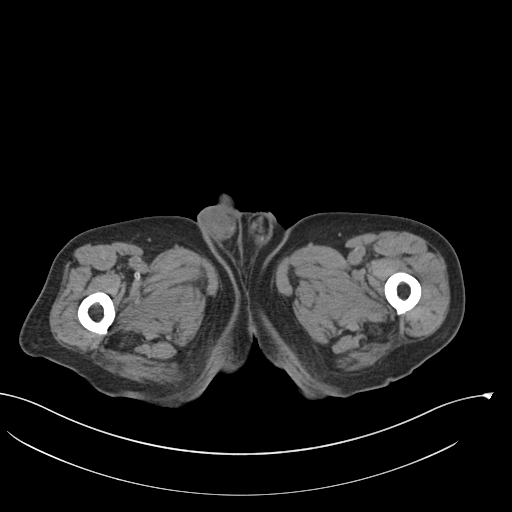
[im 5/94  bone]
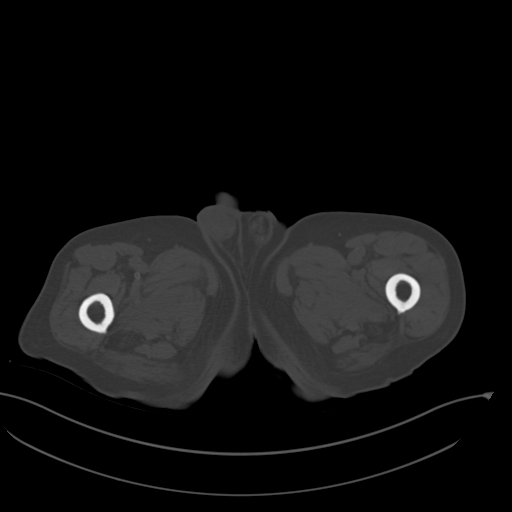
[im 15/94  soft-tissue]
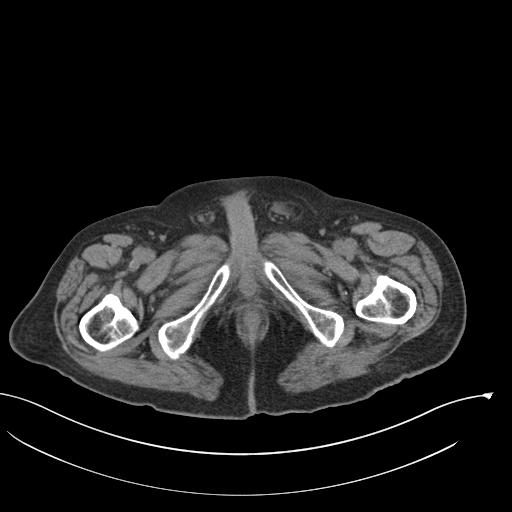
[im 20/94  soft-tissue]
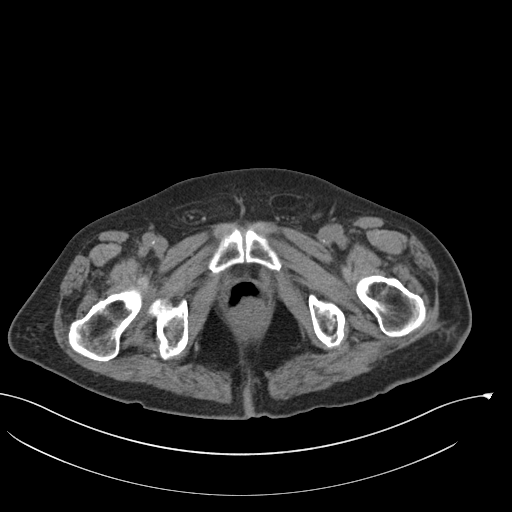
[im 30/94  soft-tissue]
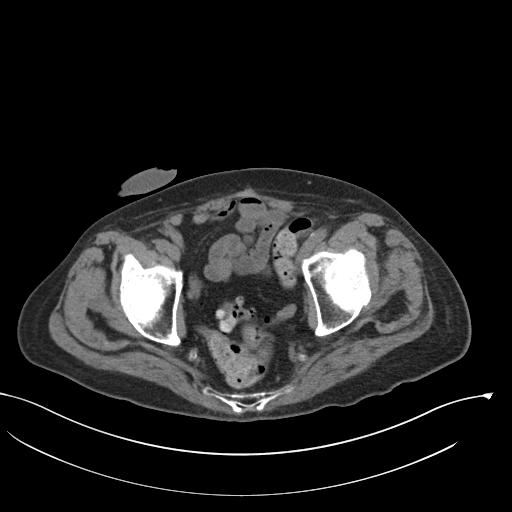
[im 35/94  soft-tissue]
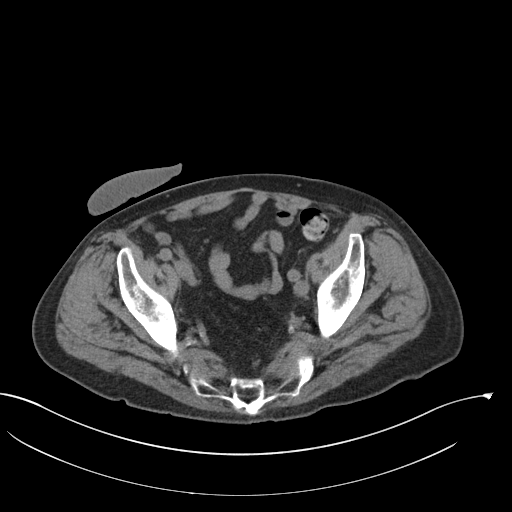
[im 45/94  soft-tissue]
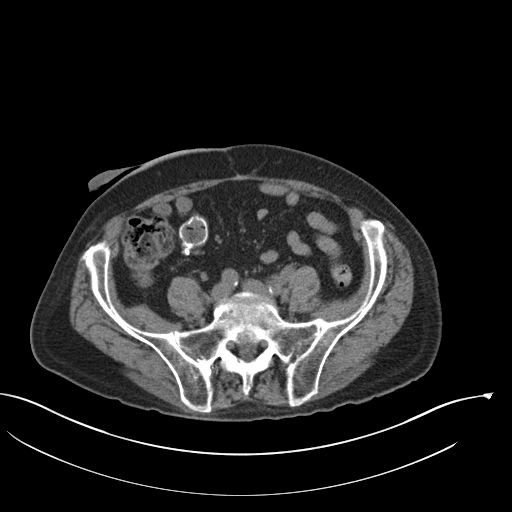
[im 49/94  soft-tissue]
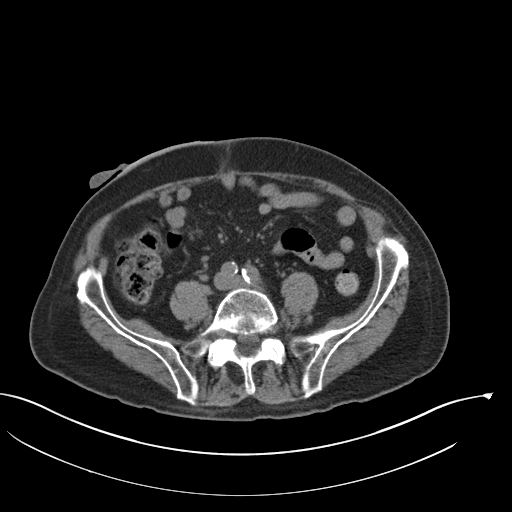
[im 59/94  soft-tissue]
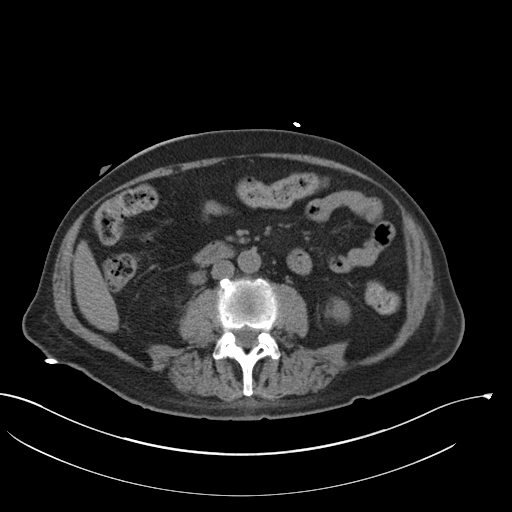
[im 64/94  soft-tissue]
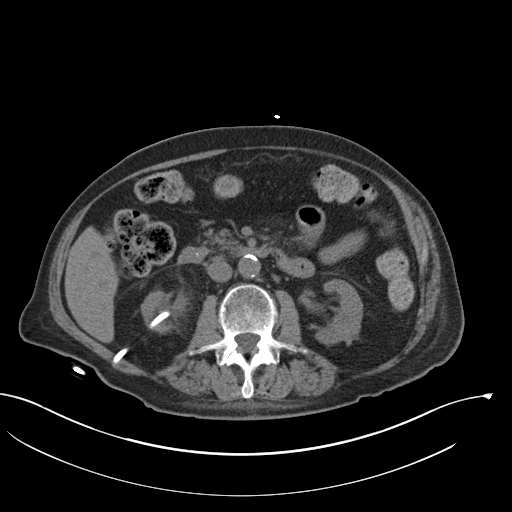
[im 64/94  bone]
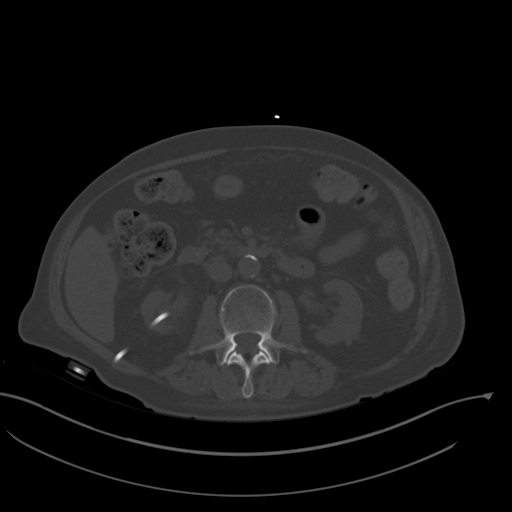
[im 74/94  soft-tissue]
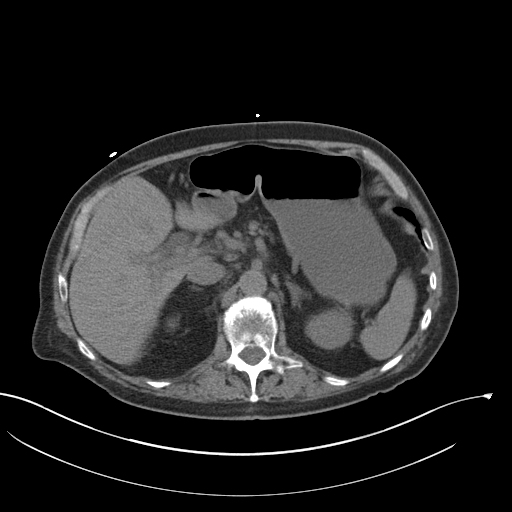
[im 79/94  soft-tissue]
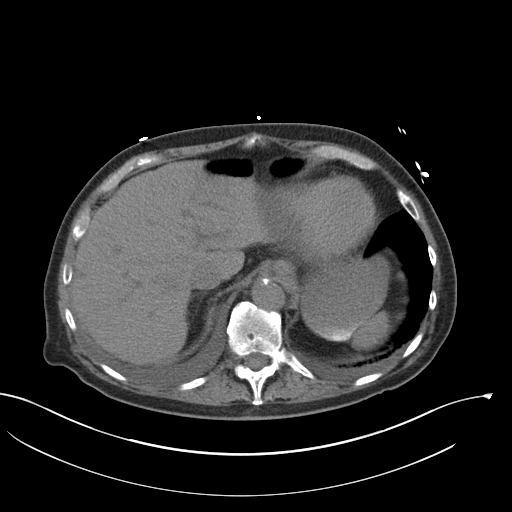
[im 89/94  soft-tissue]
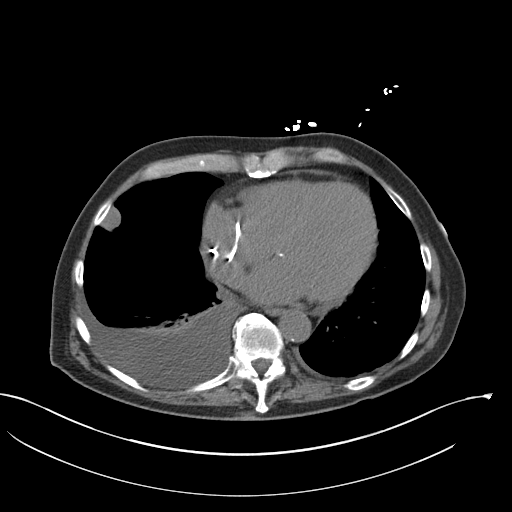

[Series 4: coronal · coronal · 0.79mm/px · 3 of 121 slices shown]
[im 41/121  soft-tissue]
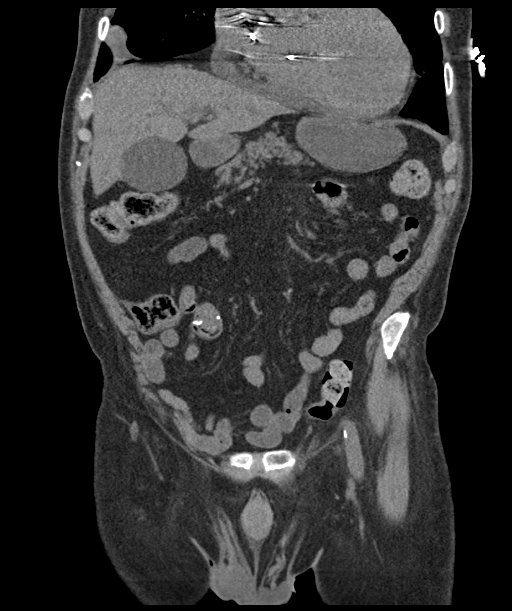
[im 54/121  soft-tissue]
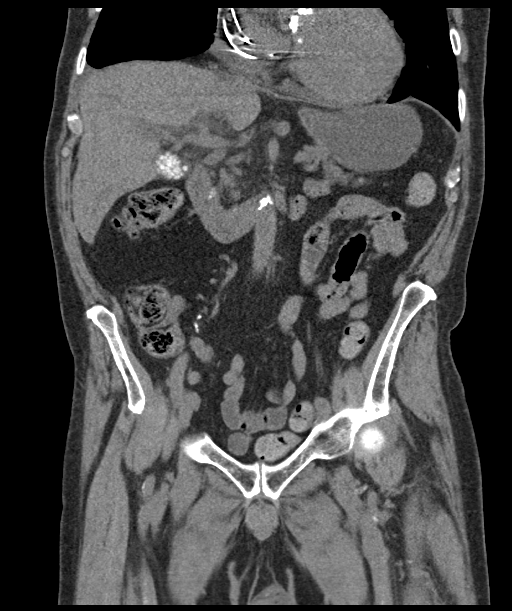
[im 67/121  soft-tissue]
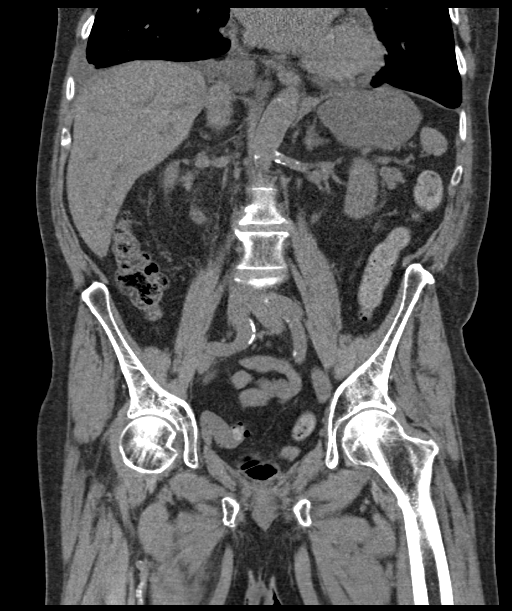

[15 of 46 positions shown; findings below may reference images not displayed]

FINDINGS: Lower chest: There is a moderate to large right-sided pleural
effusion that is only partially visualized on this exam. There is
suggestion of a 1.9 cm subpleural nodule involving the right middle
lobe (axial series 2, image 6).The heart is enlarged. There is a
small pericardial effusion.

Hepatobiliary: The liver is normal. Cholelithiasis without acute
inflammation.Multiple stones are noted in the common bile duct.
There is mild biliary ductal dilatation with the common bile duct
measuring up to 1.4 cm.

Pancreas: Normal contours without ductal dilatation. No
peripancreatic fluid collection.

Spleen: No splenic laceration or hematoma.

Adrenals/Urinary Tract:

--Adrenal glands: No adrenal hemorrhage.

--Right kidney/ureter: There is a right-sided percutaneous
nephrostomy tube. There is no evidence for hydronephrosis. The
nephrostomy tube appears somewhat kinked but is likely functional
given the lack of hydronephrosis. There is mild dilatation of the
right ureter.

--Left kidney/ureter: No evidence for hydronephrosis. There is a
stone in the lower pole measuring approximately 1.4 cm. The patient
may be status post prior left renal ablation.

--Urinary bladder: The urinary bladder is surgically absent. An
ileal conduit is noted.

Stomach/Bowel:

--Stomach/Duodenum: The stomach is moderately distended.

--Small bowel: No dilatation or inflammation.

--Colon: Rectosigmoid diverticulosis without acute inflammation.

--Appendix: Not visualized. No right lower quadrant inflammation or
free fluid.

Vascular/Lymphatic: Atherosclerotic calcification is present within
the non-aneurysmal abdominal aorta, without hemodynamically
significant

stenosis.

--No retroperitoneal lymphadenopathy.

--No mesenteric lymphadenopathy.

--there are bilateral fat containing inguinal hernias, left greater
than right. There is a low midline ventral wall hernia containing a
loop of small bowel without evidence for obstruction (axial series
2, image 68).

Reproductive: Prostate gland is surgically absent.

Other: No ascites or free air. The abdominal wall is normal.

Musculoskeletal. No acute displaced fractures.
IMPRESSION: 1. No specific abnormality to explain the patient's left flank pain.
There is no left-sided hydronephrosis. There is a 1.4 cm
nonobstructing stone in lower pole the left kidney.
2. Moderate to large right-sided pleural effusion. Small left-sided
pleural effusion.
3. Cholelithiasis with CT evidence for choledocholithiasis. Multiple
stones are noted in the common bile duct of both proximally and
distally. The common bile duct is dilated and there is mild
intrahepatic biliary ductal dilatation. There is no CT evidence for
acute calculus cholecystitis.
4. Right-sided percutaneous nephrostomy tube in place. While the
tube appears slightly tortuous and possibly kinked, the tube appears
to be functioning appropriately as there is no right-sided
hydronephrosis.
5. Findings concerning for 1.9 cm mass in the right middle lobe.
Follow-up with a nonemergent contrast enhanced CT of the chest is
recommended for further evaluation of this finding.
6. Multiple additional chronic findings as detailed above.

Aortic Atherosclerosis ([id number]).

## 2021-02-11 IMAGING — CT CT CHEST W/O CM
2 of 3 series · 15 of 36 positions shown, 18 images · non-contrast
Comparison: Abdominal CT from yesterday

CLINICAL DATA: Lung nodule on chest x-ray

EXAM:
CT CHEST WITHOUT CONTRAST
TECHNIQUE: Multidetector CT imaging of the chest was performed following the
standard protocol without IV contrast.

[Series 2: thorax · axial · 0.85mm/px · z∈[-377,-79]mm · 12 of 175 slices shown, 15 images]
[im 13/175  mediastinal]
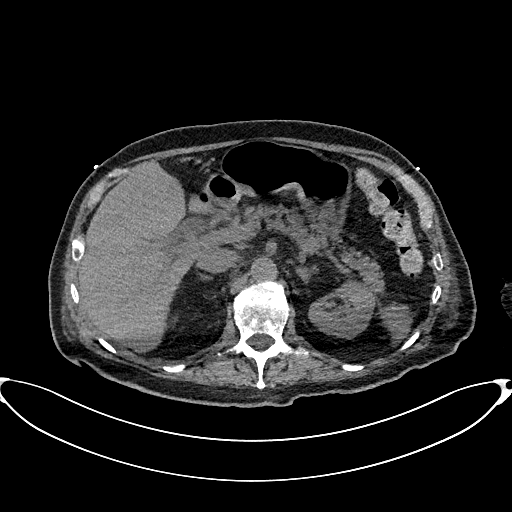
[im 13/175  lung]
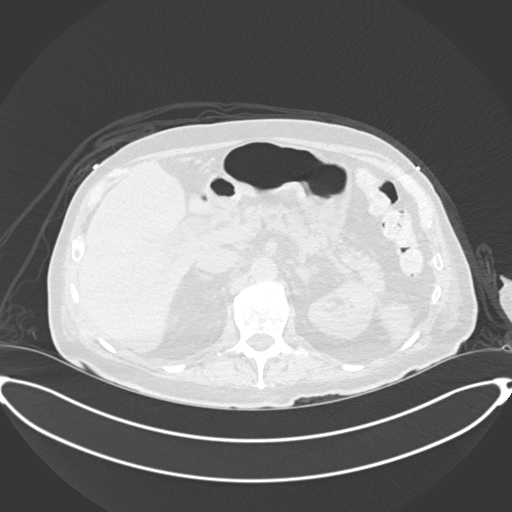
[im 26/175  lung]
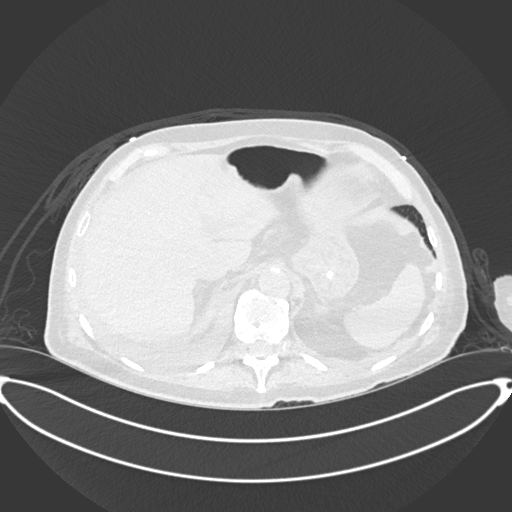
[im 39/175  lung]
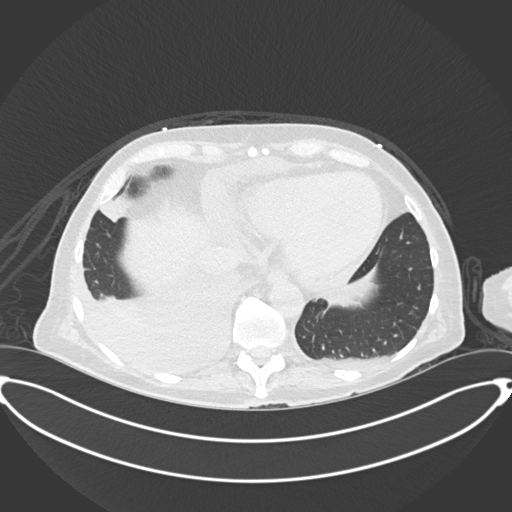
[im 52/175  lung]
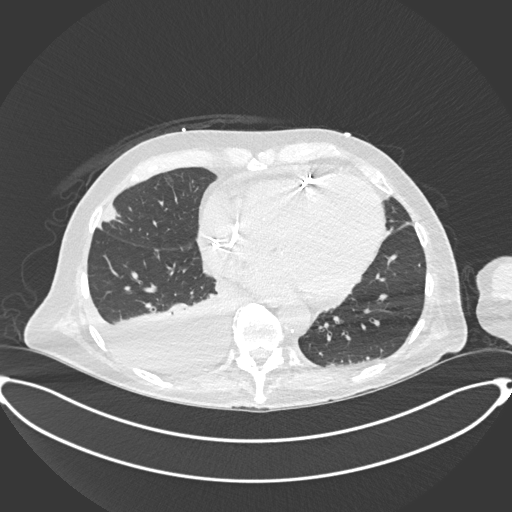
[im 65/175  mediastinal]
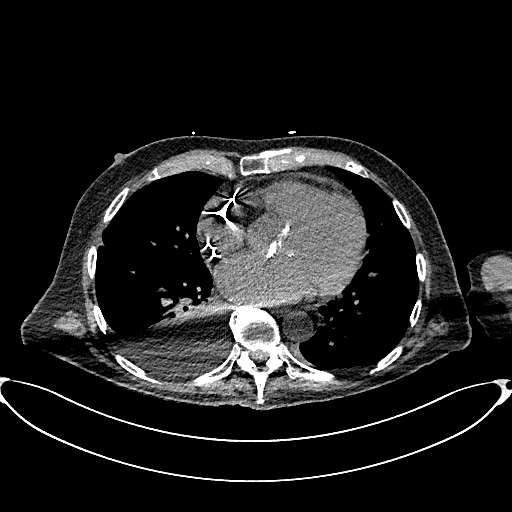
[im 65/175  lung]
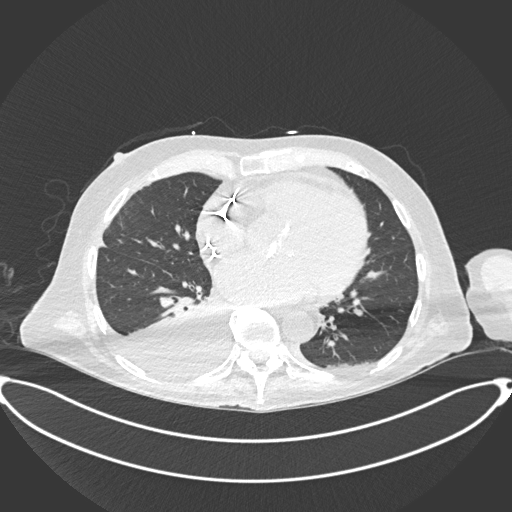
[im 78/175  lung]
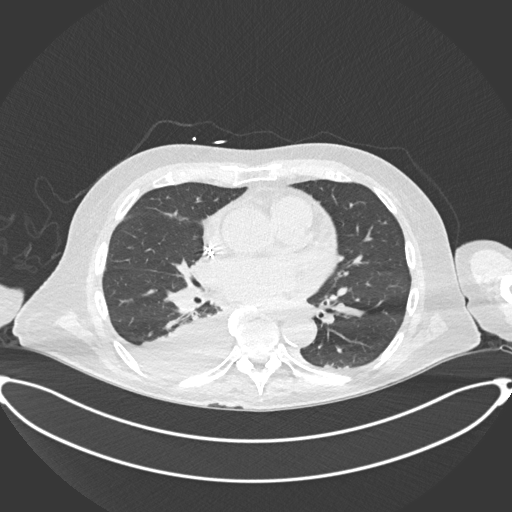
[im 97/175  lung]
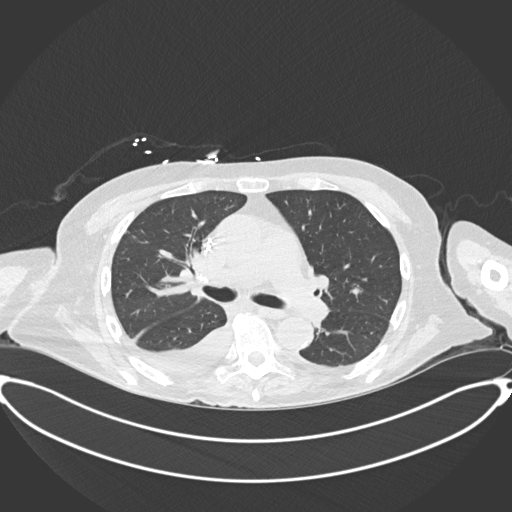
[im 110/175  lung]
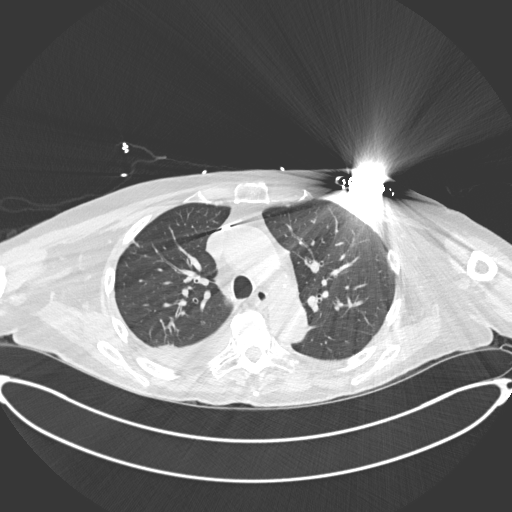
[im 123/175  mediastinal]
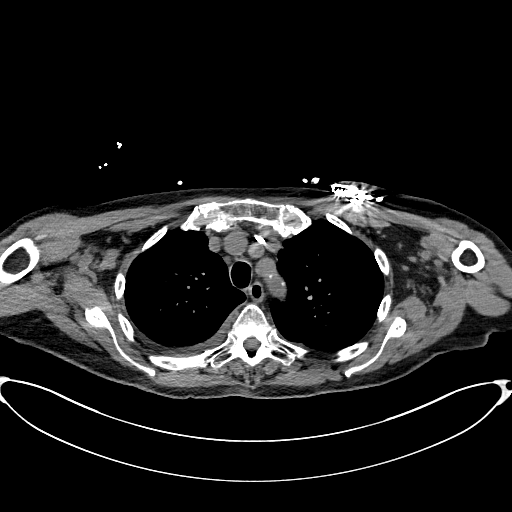
[im 123/175  lung]
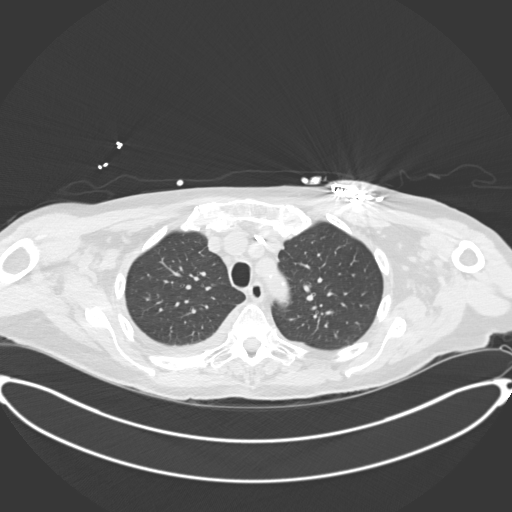
[im 136/175  lung]
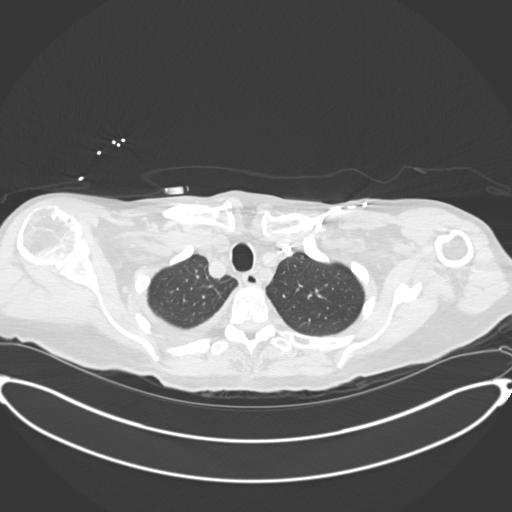
[im 149/175  lung]
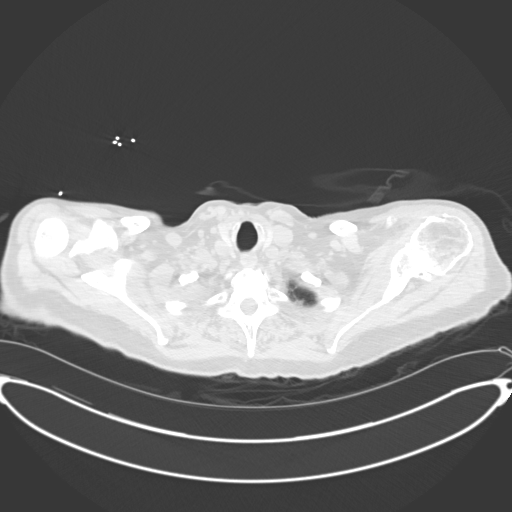
[im 162/175  lung]
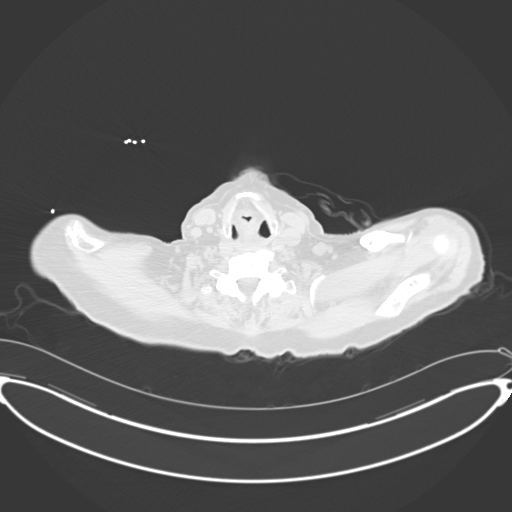

[Series 5: coronal · coronal · 0.68mm/px · 3 of 135 slices shown]
[im 27/135  lung]
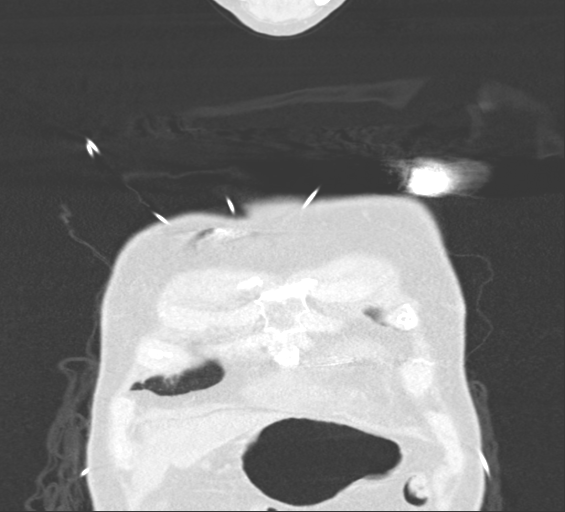
[im 54/135  lung]
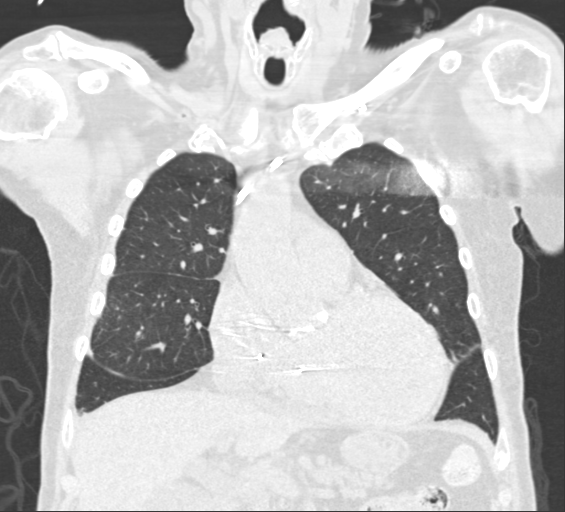
[im 81/135  lung]
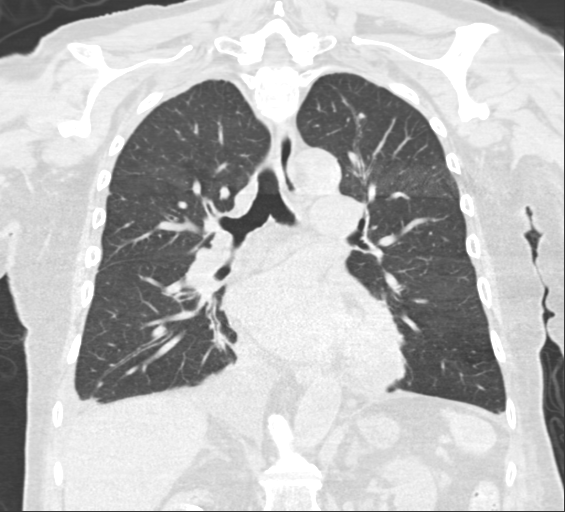

[15 of 36 positions shown; findings below may reference images not displayed]

FINDINGS: Cardiovascular: No cardiomegaly. Dual-chamber pacer leads from the
left. Low-density anterior and inferior pericardial effusion
measuring up to 12 mm in thickness. Prominent aortic valve
calcification. There is aortic and coronary atherosclerosis.

Mediastinum/Nodes: Calcified mediastinal lymph nodes from remote
granulomatous disease.

Lungs/Pleura: Clustered nodularity with some calcification in the
right upper lobe attributed to remote granulomatous disease. Along
the lower right minor fissure is a subpleural nodule measuring 17
mm. There is a small borderline moderate layering right pleural
effusion with right lower lobe atelectasis.

Upper Abdomen: Cholelithiasis and partially covered right renal
stenting. Post treatment changes to the left kidney.

Musculoskeletal: Spondylosis with multi-level bridging osteophyte.
IMPRESSION: 1. Confirmed 2 cm nodule in the right middle lobe with adjacent band
of opacity along the lower major fissure. Hopefully this reflects a
small focus of round atelectasis but neoplasm is not excluded. The
finding is new from a 9149 abdominal CT. Consider one of the
following in 3 months for both low-risk and high-risk individuals:
(a) repeat chest CT, (b) follow-up PET-CT, or (c) tissue sampling.
This recommendation follows the consensus statement: Guidelines for
Management of Incidental Pulmonary Nodules Detected on CT Images:
2. Small borderline moderate right pleural effusion with
atelectasis.
3. Pericardial effusion measuring up to 12 mm thickness.

## 2021-02-12 IMAGING — DX DG CHEST 2V
2 series · 2 of 2 positions shown · non-contrast
Comparison: 04/25/2019, 04/26/2019

CLINICAL DATA: Pleural effusion, prior abnormal chest x-ray

EXAM:
CHEST - 2 VIEW

[chest lat]
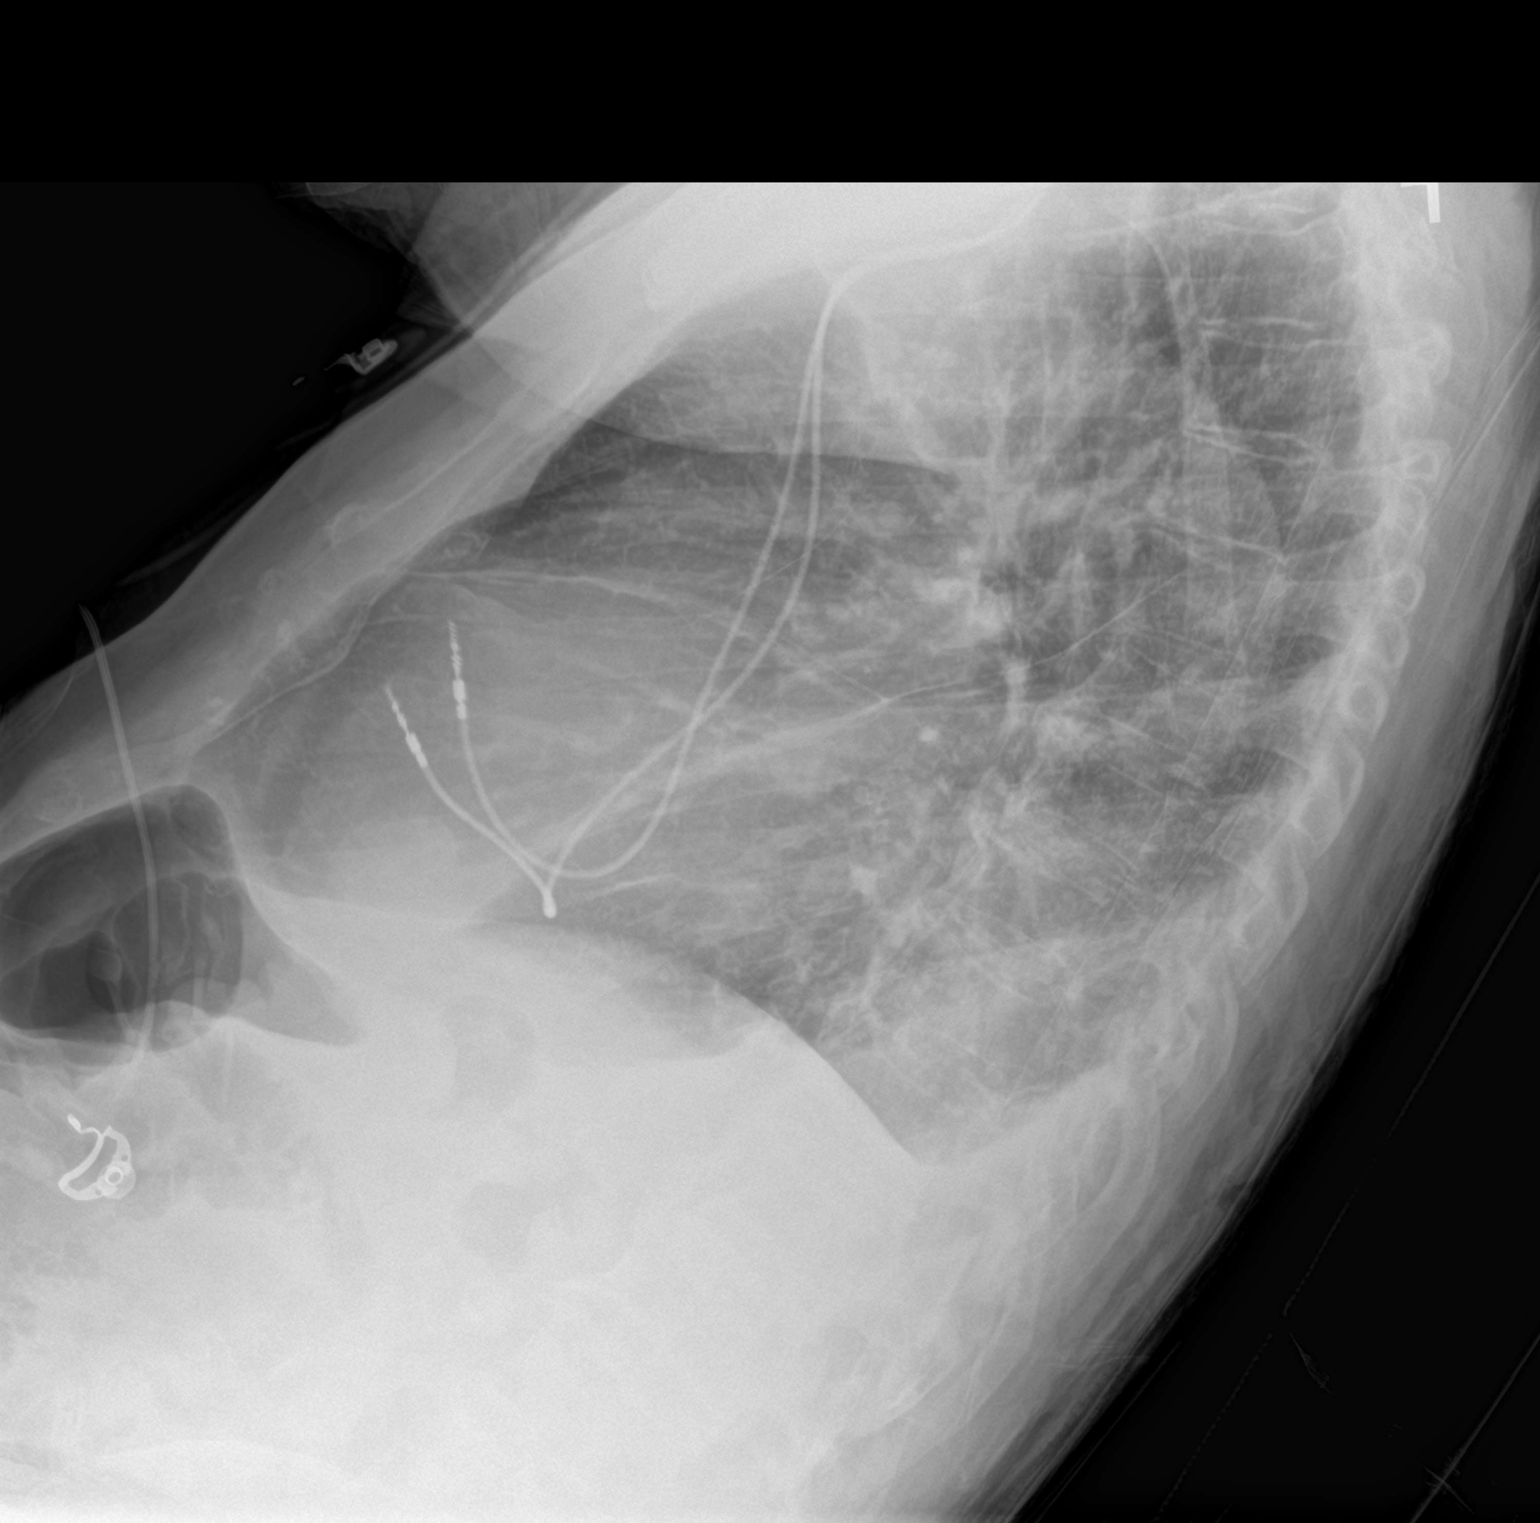

[chest ap]
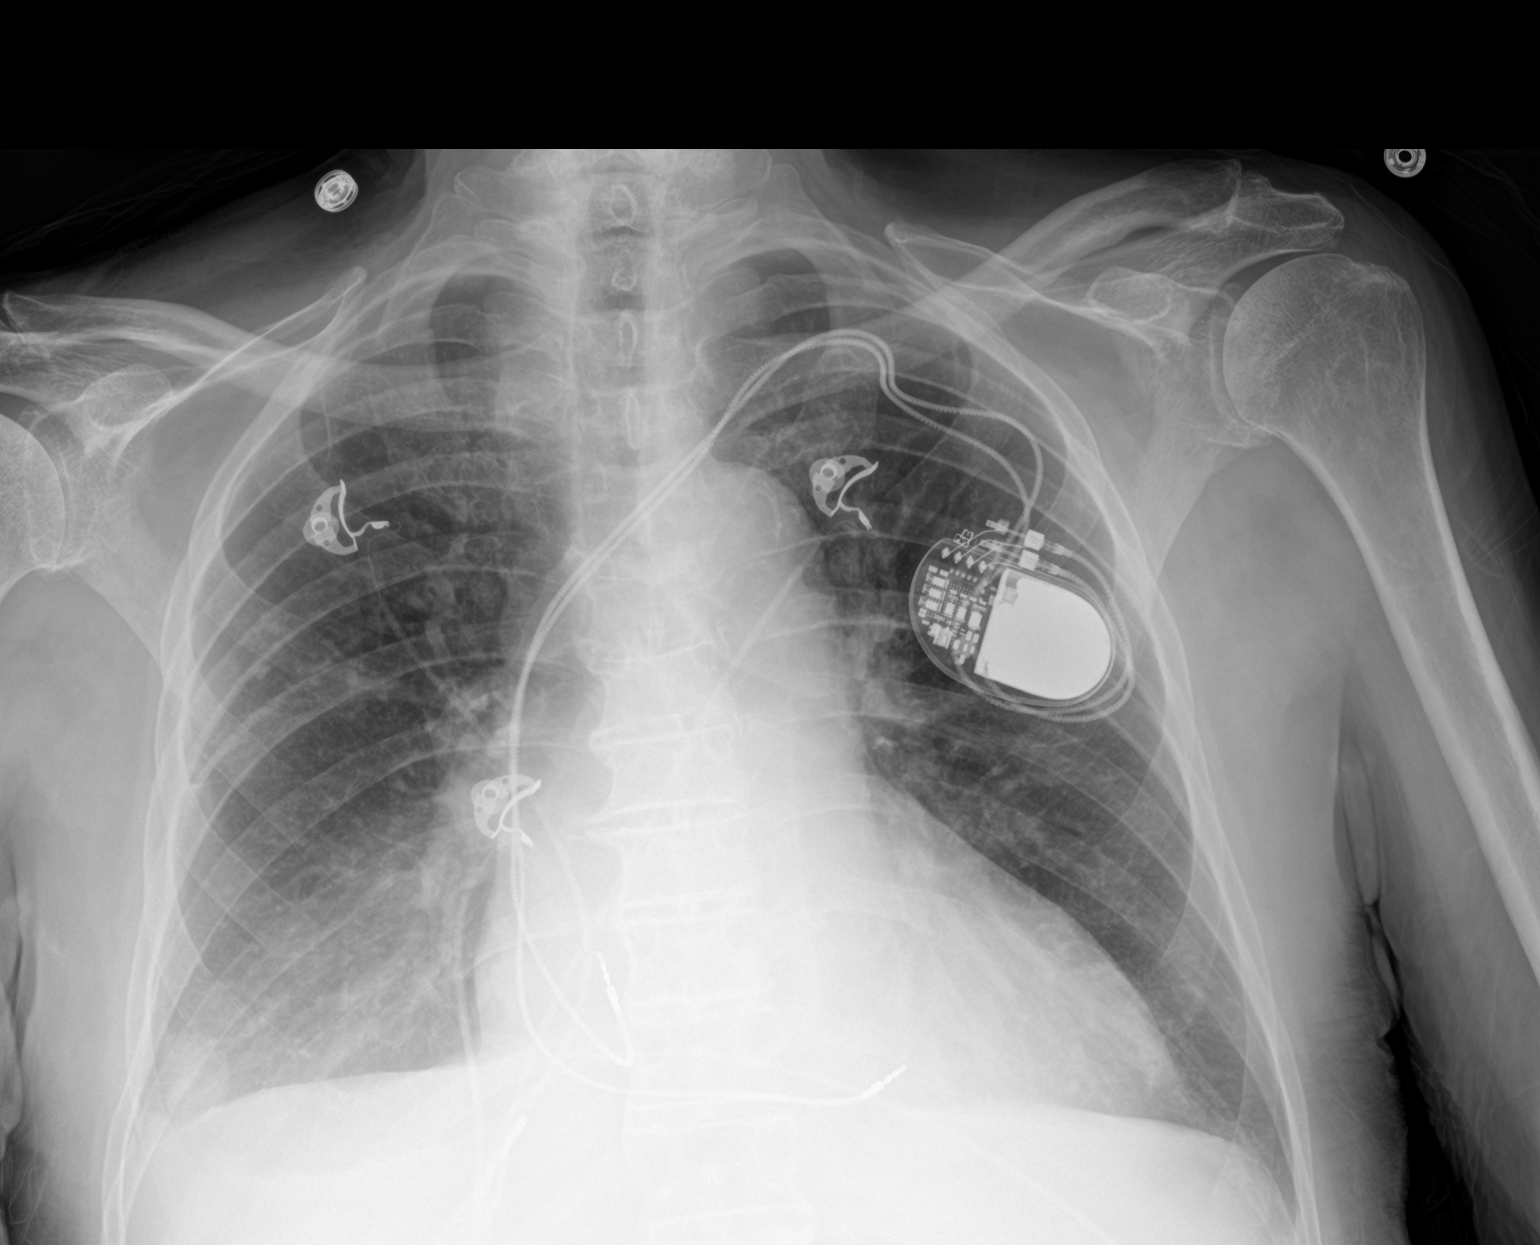

[2 of 2 positions shown; findings below may reference images not displayed]

FINDINGS: Frontal and lateral views of the chest demonstrate small right
pleural effusion with blunting of the posterior right costophrenic
angle. The rounded area of consolidation within the lateral segment
right middle lobe seen on prior CT is again identified and
unchanged. No pneumothorax. Cardiac silhouette is enlarged but
stable. Dual lead pacemaker unchanged.
IMPRESSION: 1. Stable small right pleural effusion.
2. Stable rounded consolidation right middle lobe. Please see prior
CT chest discussion.

## 2021-02-12 IMAGING — RF DG ERCP WO/W SPHINCTEROTOMY
1 series · 14 of 24 positions shown · non-contrast
Comparison: CT abdomen pelvis-04/25/2019

FLUOROSCOPY TIME:  4 minutes, 56 seconds (93.85 mGy)

CLINICAL DATA: ERCP with sphincterotomy and removal of multiple CBD
stones.

EXAM:
ERCP
TECHNIQUE: Multiple spot images obtained with the fluoroscopic device and
submitted for interpretation post-procedure.

[Series 1: run · 10 acquisitions, 14 frames shown]
[im 1/10]
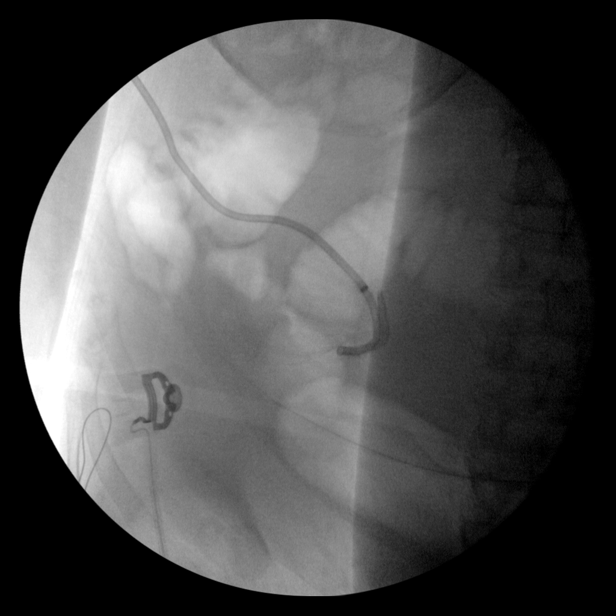
[im 2/10]
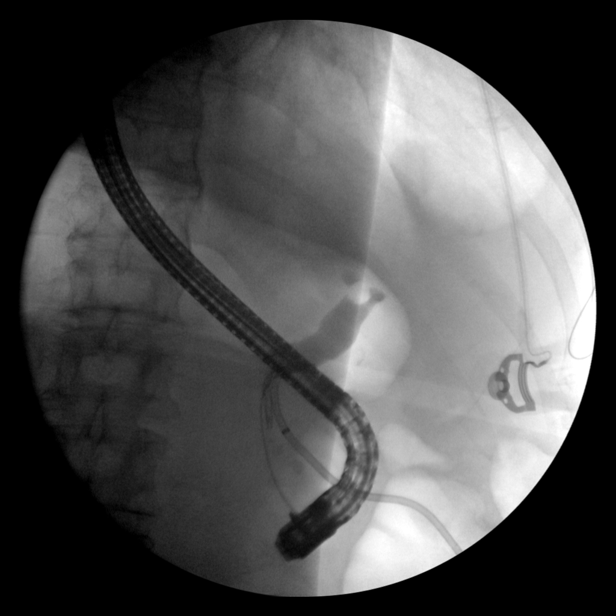
[im 3/10]
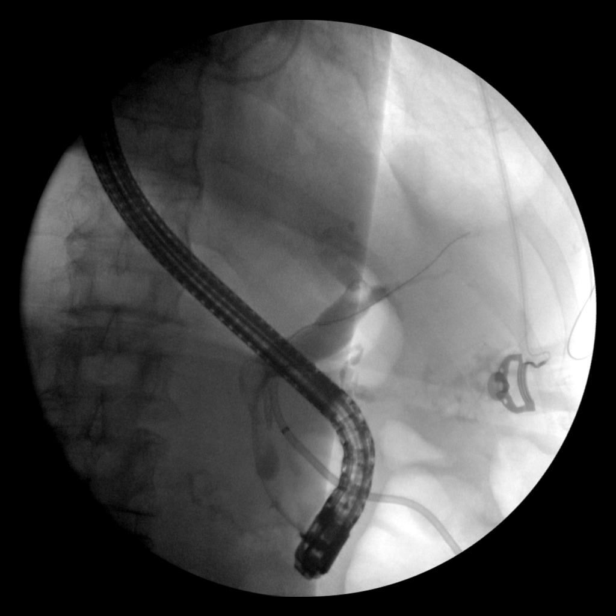
[im 3/10]
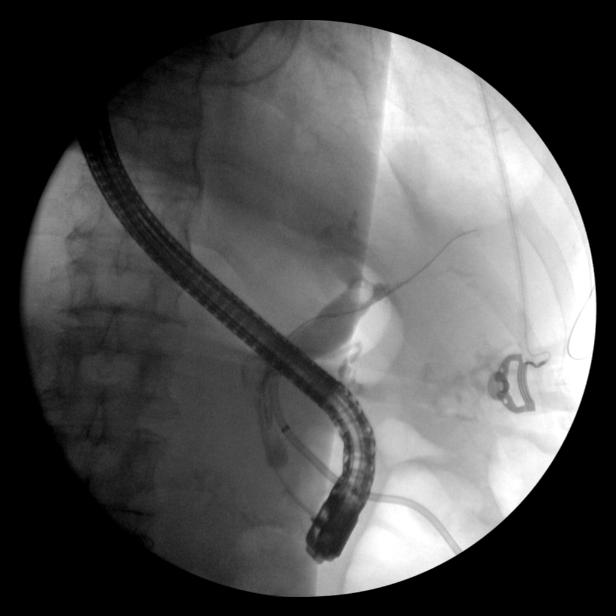
[im 3/10]
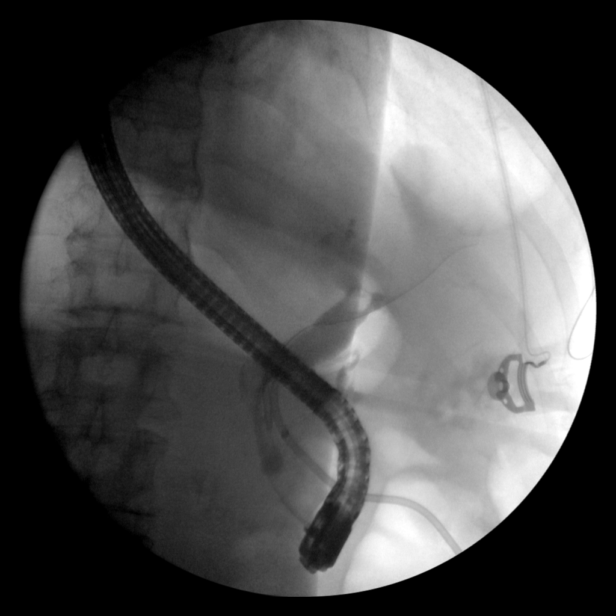
[im 5/10]
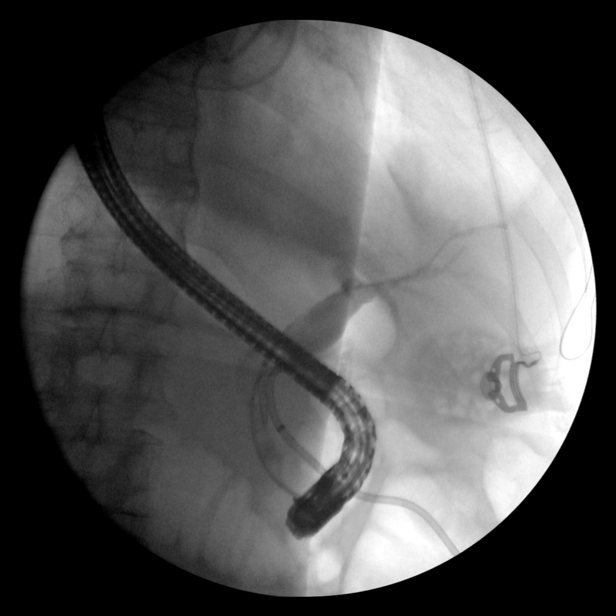
[im 5/10]
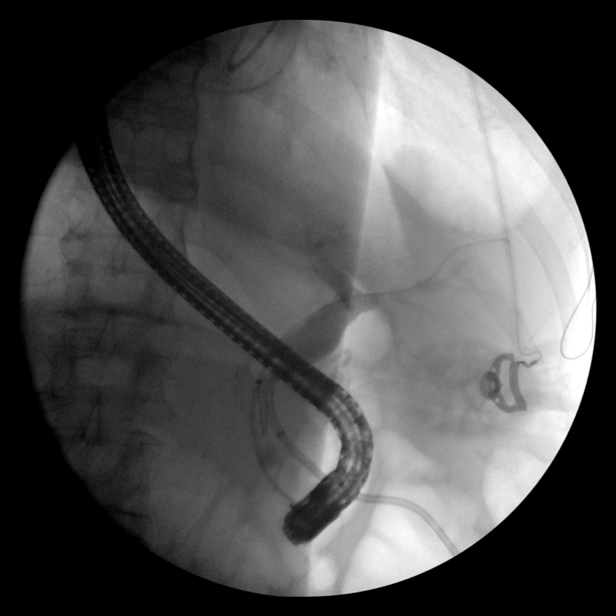
[im 6/10]
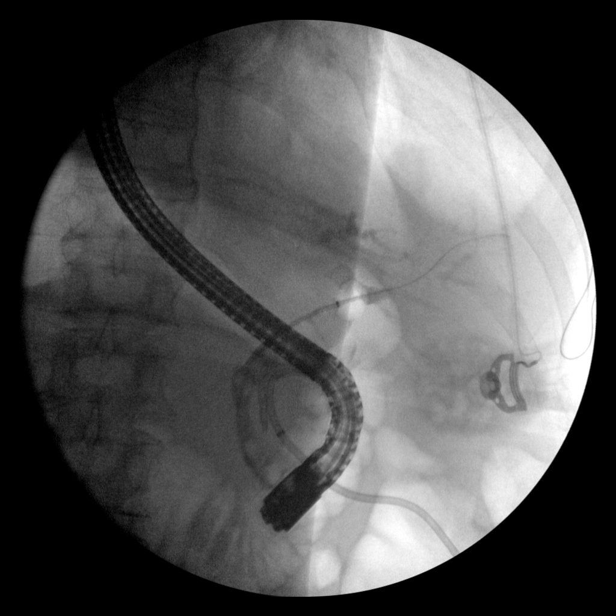
[im 7/10]
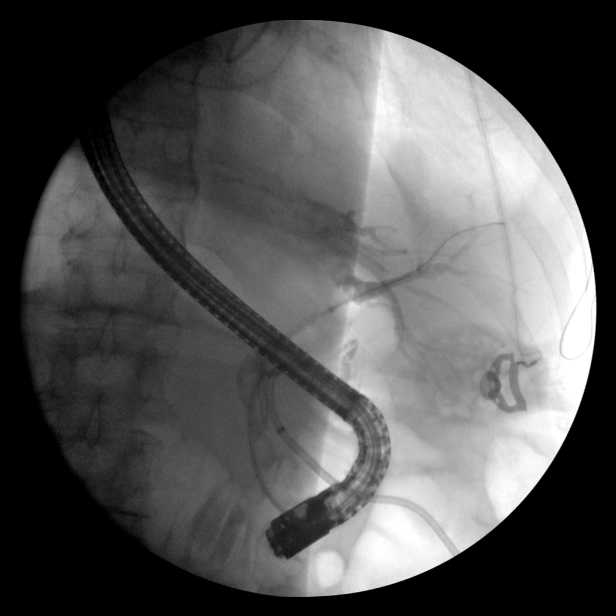
[im 8/10]
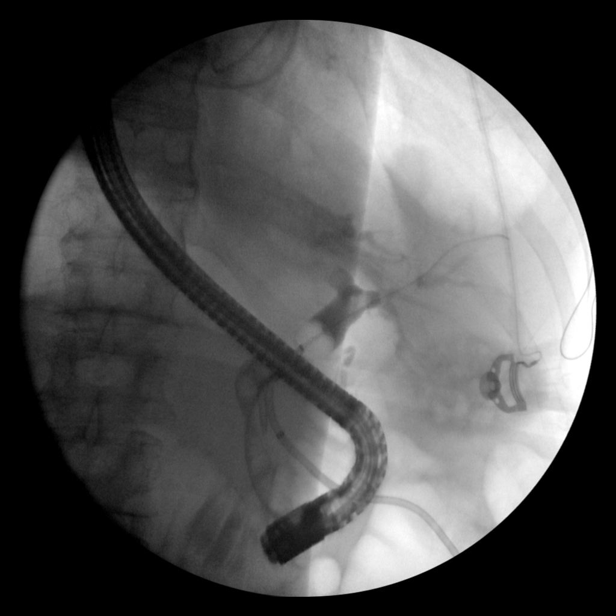
[im 8/10]
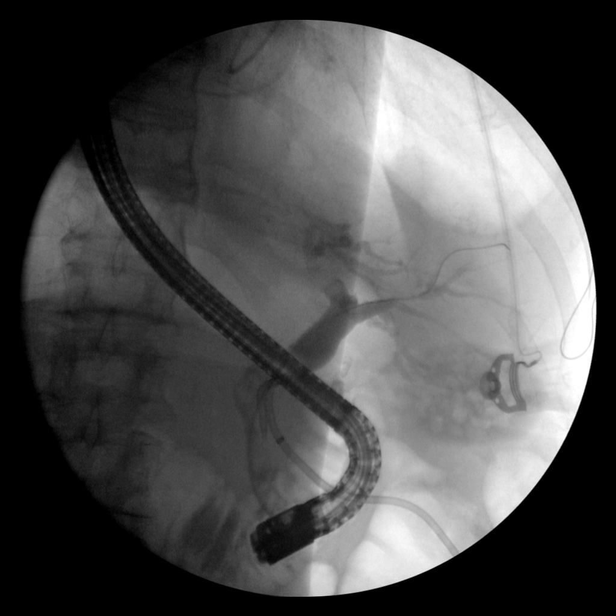
[im 8/10]
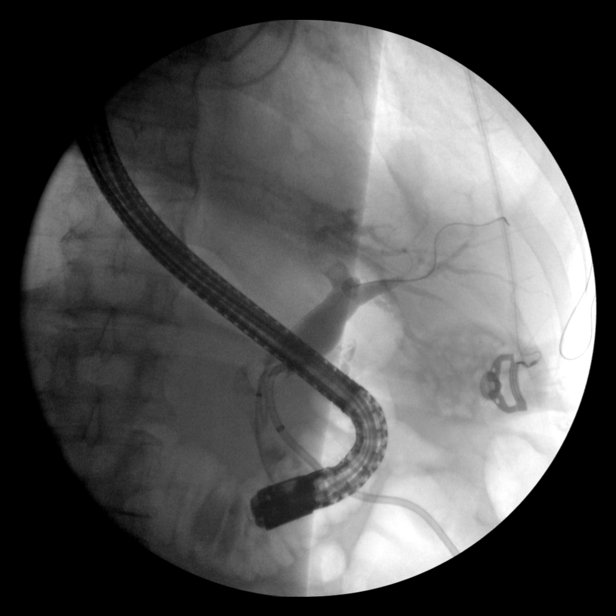
[im 9/10]
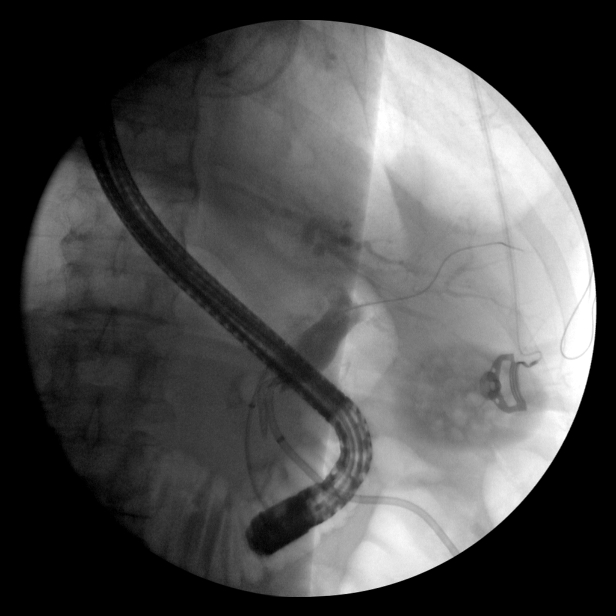
[im 10/10]
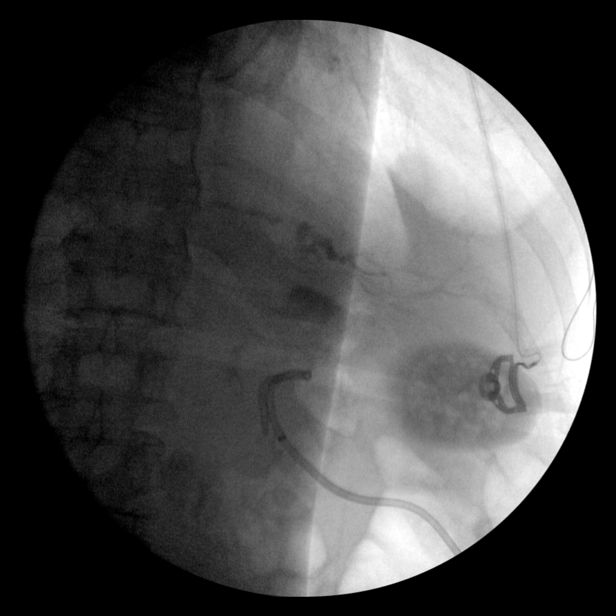

[14 of 24 positions shown; findings below may reference images not displayed]

FINDINGS: Seven spot intraoperative fluoroscopic images of the right upper
abdominal quadrant during ERCP are provided for review

Initial image demonstrates the superior end of the patient's chronic
right-sided percutaneous nephrostomy catheter

Subsequent images demonstrate an ERCP probe overlying the right
upper abdominal quadrant. There is selective cannulation
opacification of the common bile duct which appears moderately
dilated.

There are at least 2 persistent nonocclusive filling defects within
the common bile duct favored to represent choledocholithiasis

Subsequent images demonstrate insufflation of a balloon within the
central aspect of the CBD with subsequent biliary sweeping and
presumed sphincterotomy.

There is faint opacification of cystic duct with opacification of
the gallbladder lumen. There are multiple persistent filling defects
within the opacified gallbladder lumen compatible known
cholelithiasis.
IMPRESSION: 1. Choledocholithiasis with subsequent biliary sweeping and stone
extraction.
2. Cholelithiasis.

These images were submitted for radiologic interpretation only.
Please see the procedural report for the amount of contrast and the
fluoroscopy time utilized.

## 2021-03-05 IMAGING — XA IR EXCHANGE NEPHROSTOMY RIGHT
3 series · 9 of 9 positions shown · non-contrast
Comparison: 04/01/2019

INDICATION: Ureteral stricture, 6 week nephrostomy exchange

EXAM:
FLUOROSCOPIC EXCHANGE CHRONIC RIGHT NEPHROSTOMY

[Series 2: fl - angio · 3 of 43 frames shown (1 of 2)]
[frame 7/43]
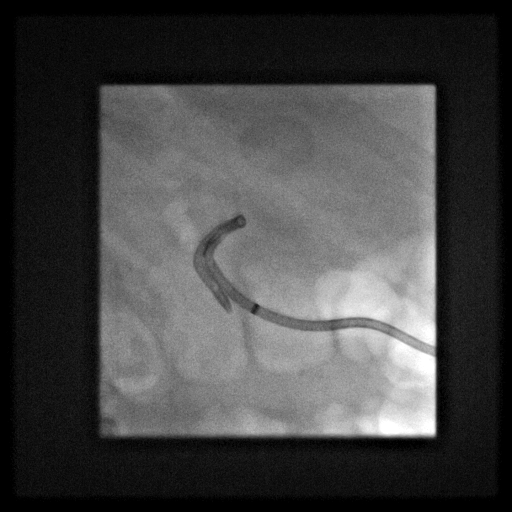
[frame 22/43]
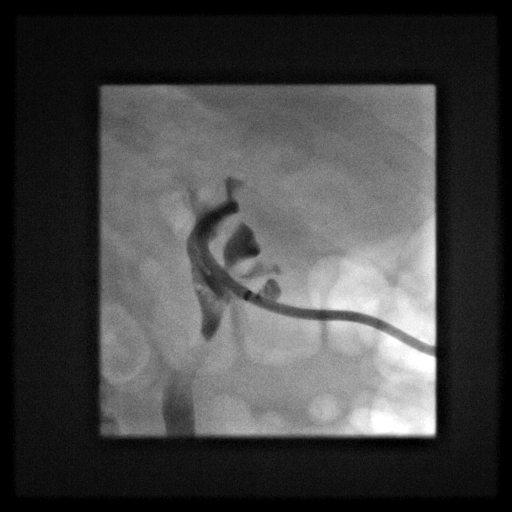
[frame 37/43]
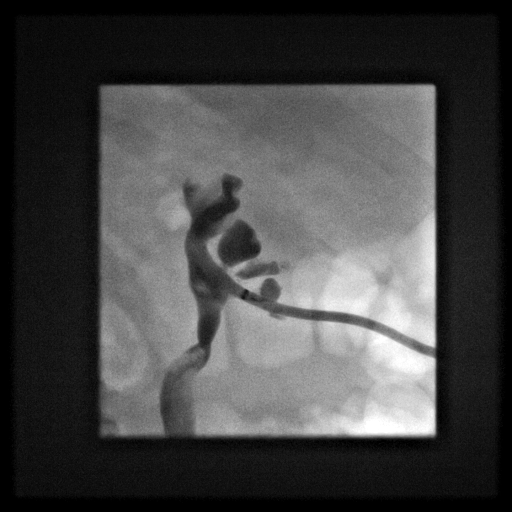

[Series 3: fl - angio · 4 of 16 frames shown (2 of 2)]
[frame 3/16]
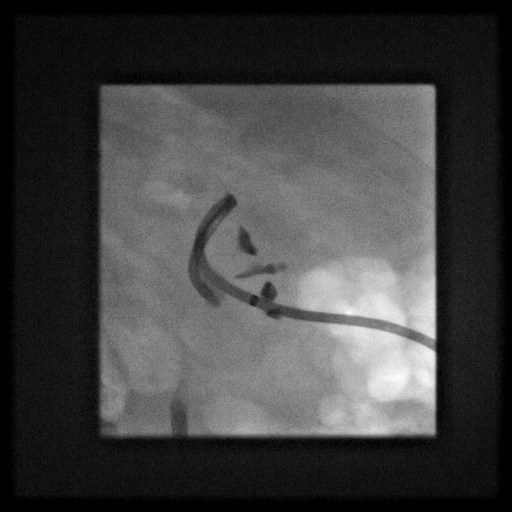
[frame 6/16]
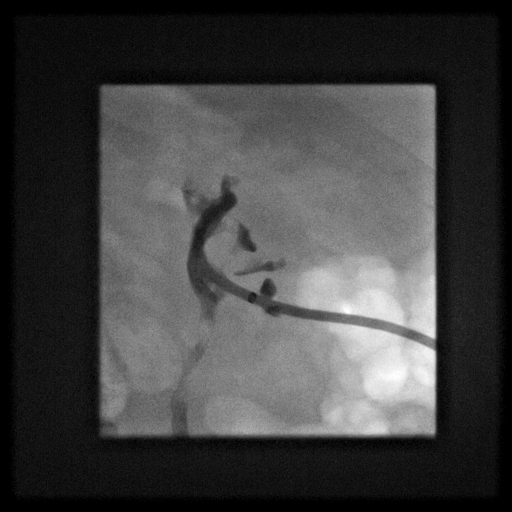
[frame 9/16]
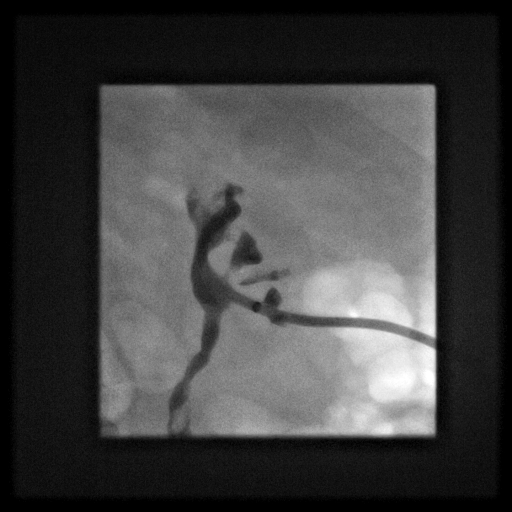
[frame 14/16]
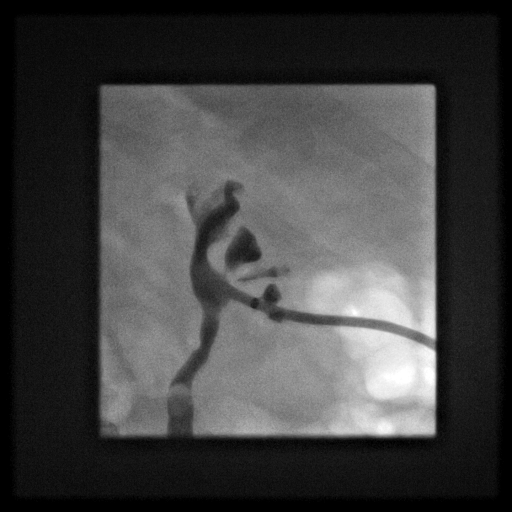

[Series 300: ir nephrostomy exchange right · 2 of 2 slices shown]
[im 1/2]
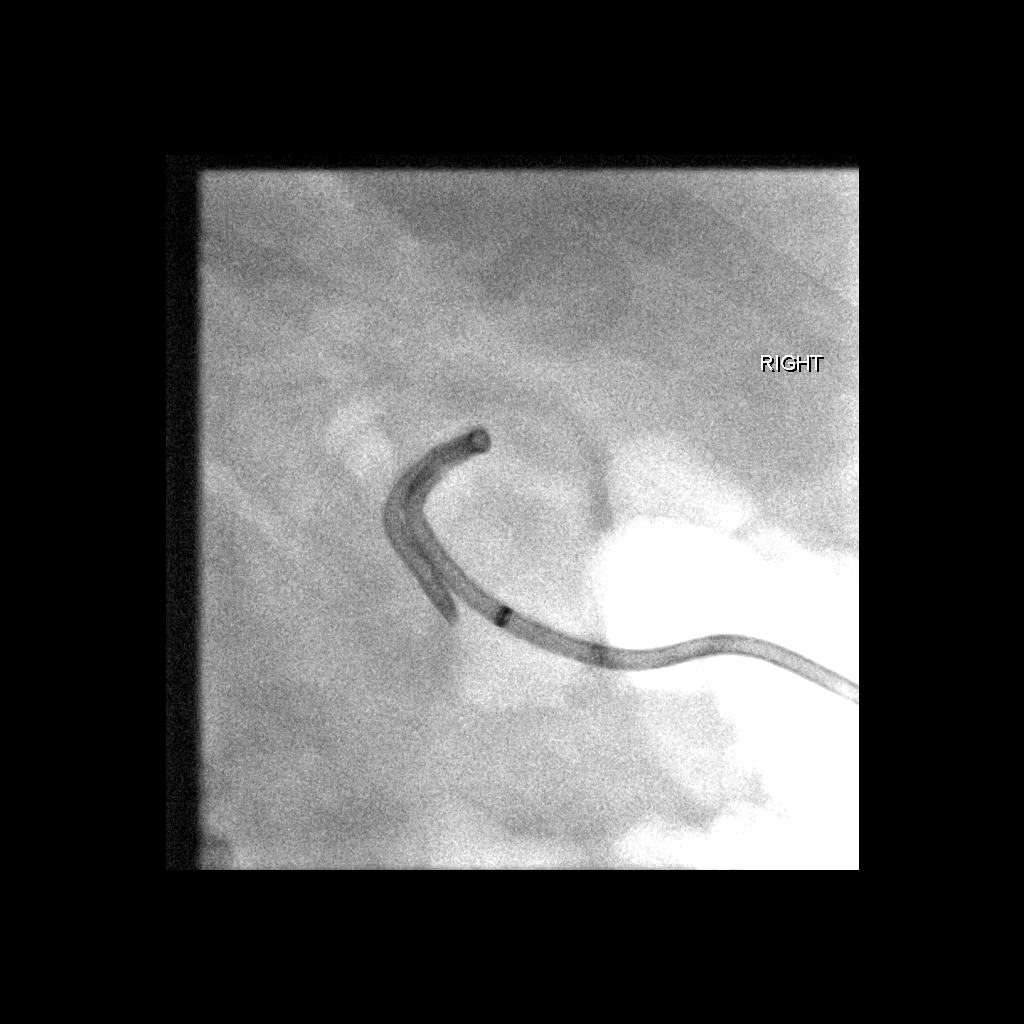
[im 2/2]
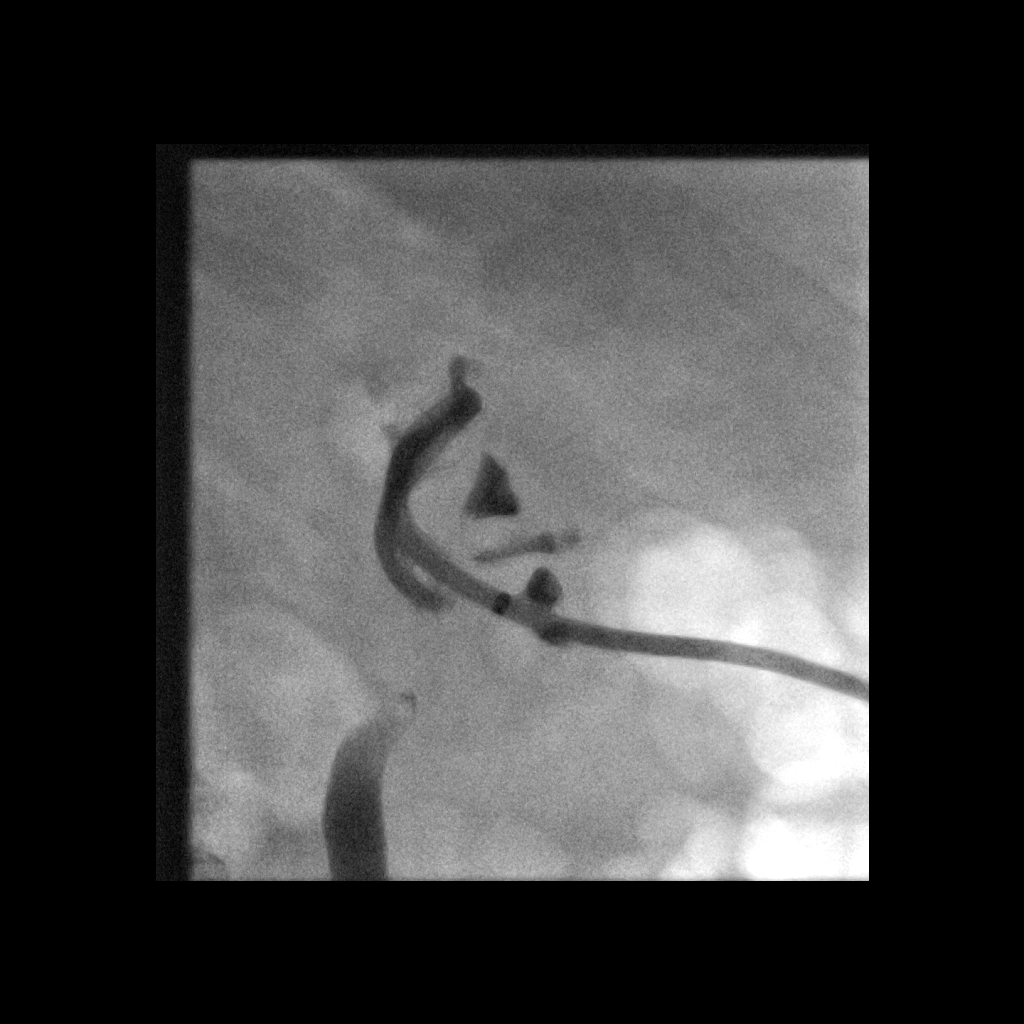

[9 of 9 positions shown; findings below may reference images not displayed]

MEDICATIONS:
1% LIDOCAINE LOCAL

ANESTHESIA/SEDATION:
Moderate Sedation Time:  None.

The patient was continuously monitored during the procedure by the
interventional radiology nurse under my direct supervision.

CONTRAST:  10 cc omni 300-administered into the collecting system(s)

FLUOROSCOPY TIME:  Fluoroscopy Time: 1 minutes 6 seconds (11 mGy).

COMPLICATIONS:
None immediate.

PROCEDURE:
Informed written consent was obtained from the patient after a
thorough discussion of the procedural risks, benefits and
alternatives. All questions were addressed. Maximal Sterile Barrier
Technique was utilized including caps, mask, sterile gowns, sterile
gloves, sterile drape, hand hygiene and skin antiseptic. A timeout
was performed prior to the initiation of the procedure.

Under sterile conditions and local anesthesia, the chronic right
nephrostomy was exchanged over a Amplatz guidewire. New nephrostomy
confirmed within the collapsed renal pelvis extending into the upper
pole. Similar position as before. Contrast injection confirms
position. Catheter secured with Prolene suture and connected to
external gravity drainage bag. Sterile dressing applied. No
immediate complication.
IMPRESSION: Successful right nephrostomy fluoroscopic exchange.

## 2021-04-04 IMAGING — CT CT ABD-PELV W/O CM
2 of 4 series · 15 of 46 positions shown, 17 images · non-contrast
Comparison: 04/25/2019

CLINICAL DATA: Abdominal pain. Recent kidney stone removal. History
of bladder and prostate surgery.

EXAM:
CT ABDOMEN AND PELVIS WITHOUT CONTRAST
TECHNIQUE: Multidetector CT imaging of the abdomen and pelvis was performed
following the standard protocol without IV contrast.

[Series 2: axial st · axial · 0.84mm/px · z∈[-384,+16]mm · 12 of 92 slices shown, 14 images]
[im 6/92  soft-tissue]
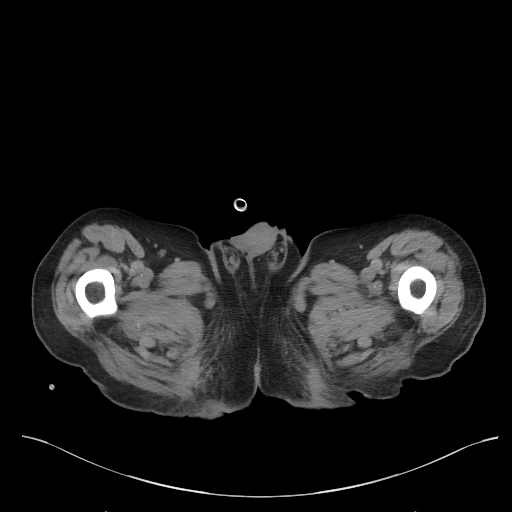
[im 6/92  bone]
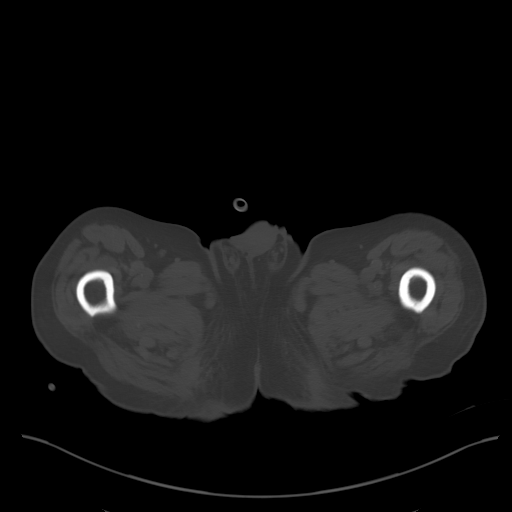
[im 17/92  soft-tissue]
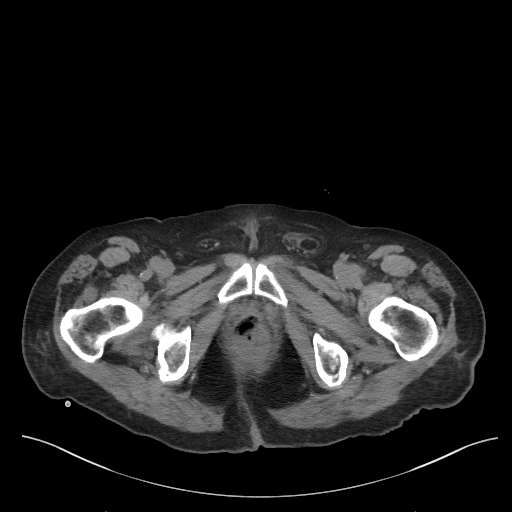
[im 22/92  soft-tissue]
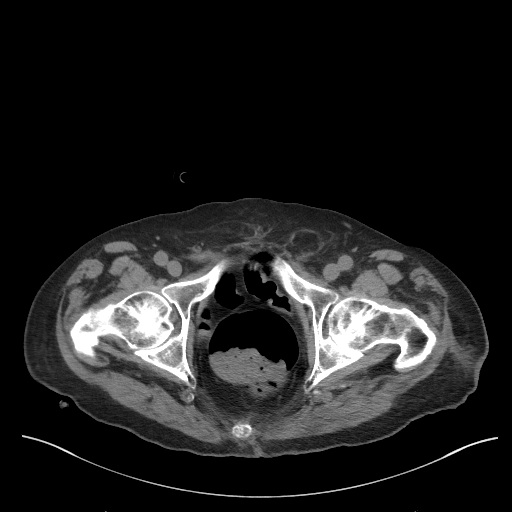
[im 27/92  soft-tissue]
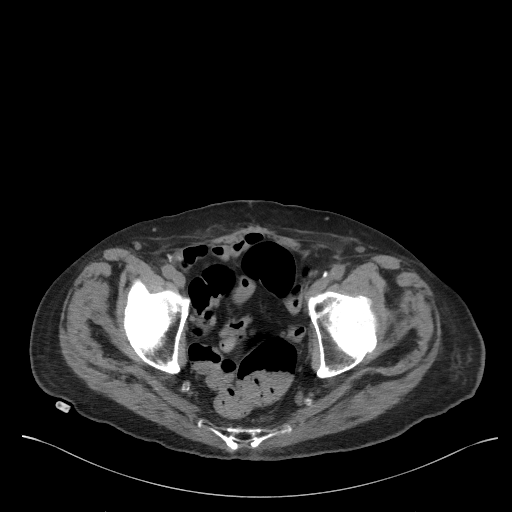
[im 38/92  soft-tissue]
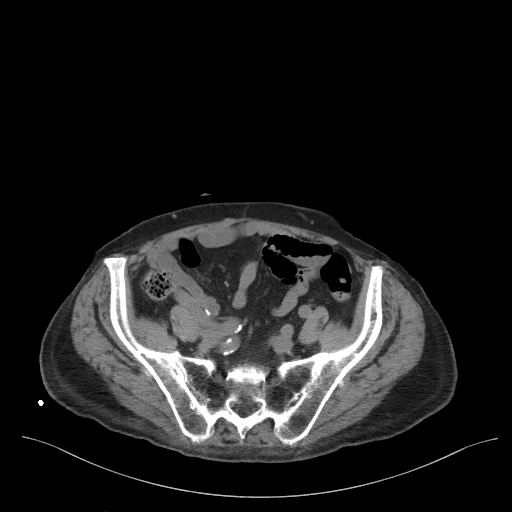
[im 43/92  soft-tissue]
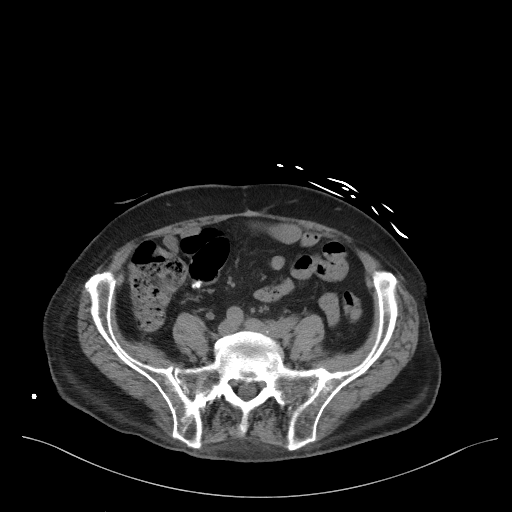
[im 49/92  soft-tissue]
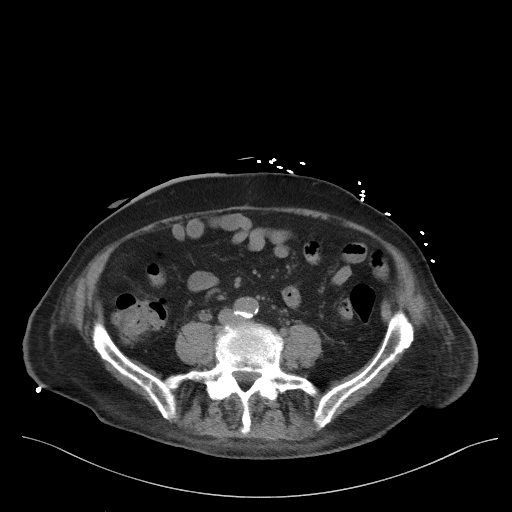
[im 59/92  soft-tissue]
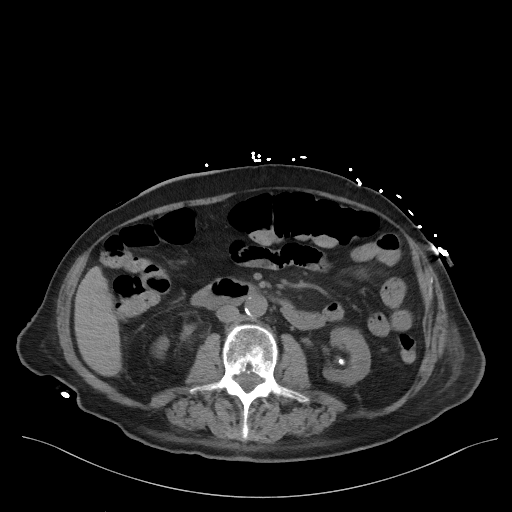
[im 65/92  soft-tissue]
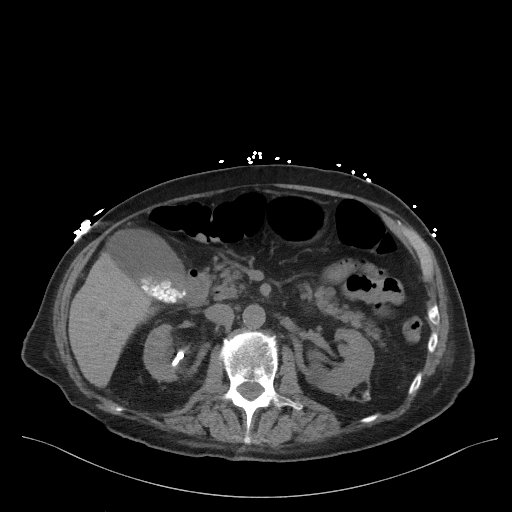
[im 65/92  bone]
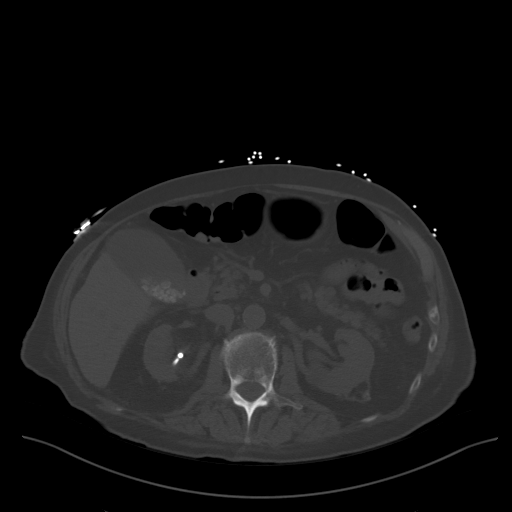
[im 70/92  soft-tissue]
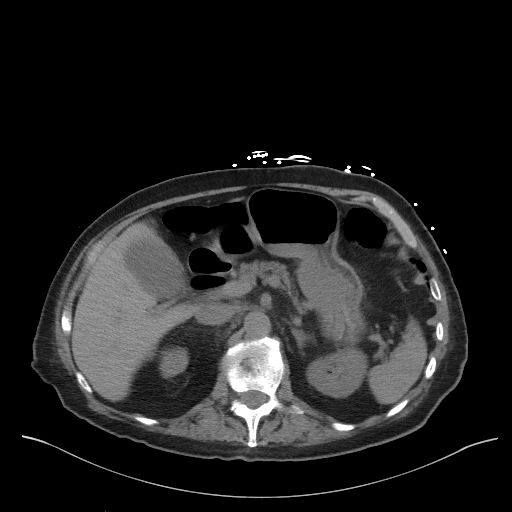
[im 81/92  soft-tissue]
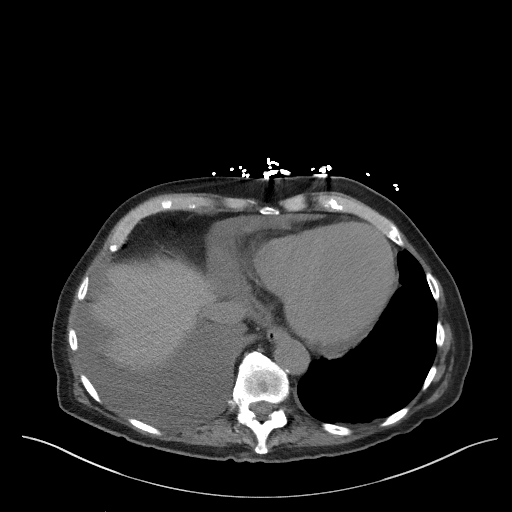
[im 86/92  soft-tissue]
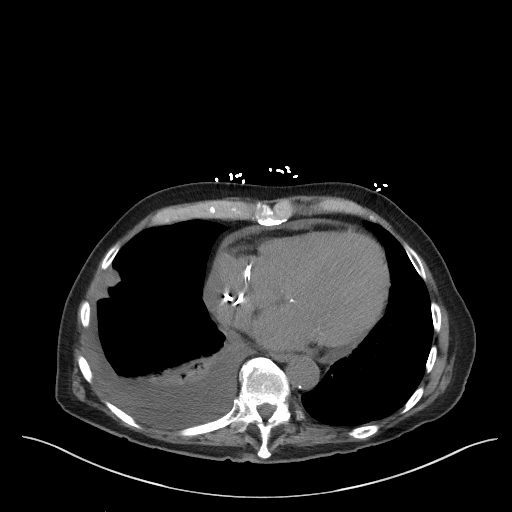

[Series 4: coronal st · coronal · 0.83mm/px · 3 of 93 slices shown]
[im 31/93  soft-tissue]
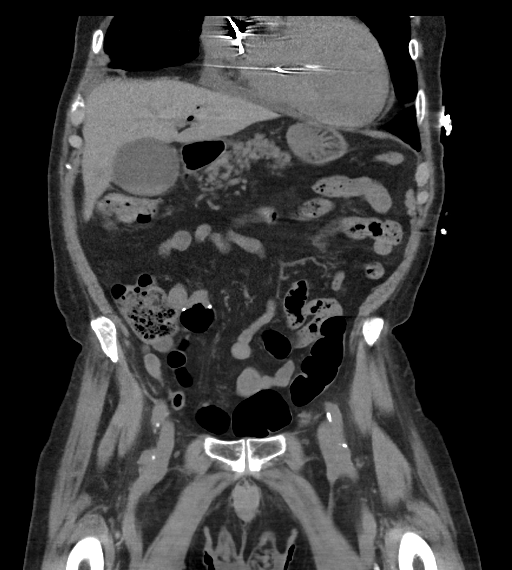
[im 41/93  soft-tissue]
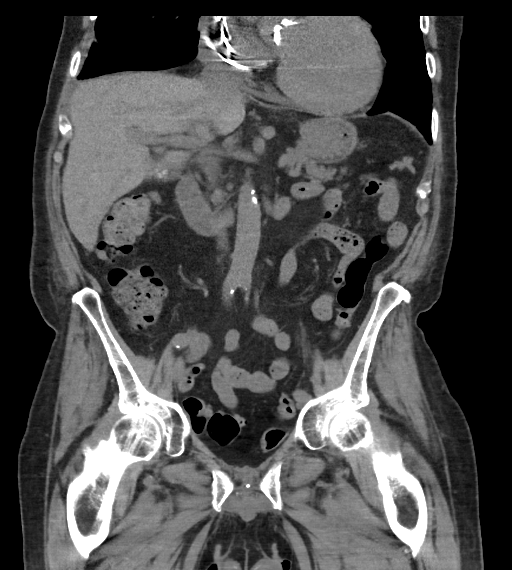
[im 52/93  soft-tissue]
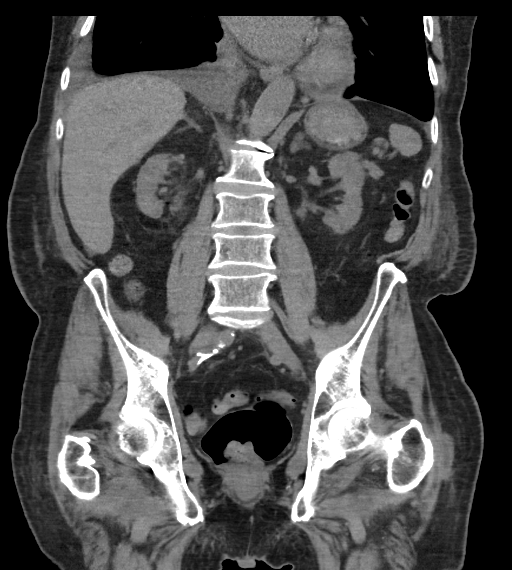

[15 of 46 positions shown; findings below may reference images not displayed]

FINDINGS: Lower chest: Moderate right and trace left pleural effusions. There
is dependent opacity in the right lower lobe, minimally on the left,
consistent with atelectasis. Small to moderate pericardial effusion.
These findings are stable from the prior CT.

Hepatobiliary: Liver normal in size and attenuation. No masses.
Multiple dependent gallstones. No convincing gallbladder wall
thickening. There is intra and extrahepatic biliary air. Patient has
had a sphincterotomy since the prior CT.

Pancreas: Unremarkable. No pancreatic ductal dilatation or
surrounding inflammatory changes.

Spleen: Normal in size without focal abnormality.

Adrenals/Urinary Tract: No adrenal masses.

Right nephrostomy tube is well positioned. No intrarenal collecting
system dilation. Right renal cortical thinning with overall right
renal atrophy. The right ureter is mildly dilated. It connects to a
right lower quadrant ileal conduit. Stable nonobstructing stone
lower pole of the left kidney. Low-density mass in the medial
midpole consistent with a cyst, also stable. Stranding is noted
along the lateral margin of the left kidney consistent with
scarring, also unchanged. No left hydronephrosis. Left ureter is
normal in caliber, also connecting to the right lower quadrant ileal
conduit.

Stomach/Bowel: Normal stomach. Small bowel and colon are normal in
caliber. No wall thickening. No inflammation. Bowel anastomosis
staples noted in the right lower quadrant. Ileo conduit extends to
the right mid to lower abdomen anterior abdominal wall. Stool and
air mild to moderately distends the rectum.

Vascular/Lymphatic: Aortic atherosclerosis. No aneurysm. No enlarged
lymph nodes.

Reproductive: Unremarkable.

Other: Small low anterior abdominal wall hernia containing a knuckle
of small bowel without obstruction or evidence strangulation. This
is stable from the prior CT. No ascites.

Musculoskeletal: No fracture or acute finding. No osteoblastic or
osteolytic lesions.
IMPRESSION: 1. No acute findings within the abdomen or pelvis.
2. Distended gallbladder with gallstones, without convincing
evidence of acute cholecystitis.
3. Well-positioned right nephrostomy tube, stable from the prior
exam. No hydronephrosis. Stable left lower pole intrarenal stone. No
left hydronephrosis.
4. No bowel obstruction or inflammation.
5. Stable aortic atherosclerosis.
6. Moderate right and trace left pleural effusions associated with
dependent atelectasis. Small to moderate pericardial effusion. Lung
base findings are stable from the prior CT.

## 2021-04-16 IMAGING — XA IR EXCHANGE NEPHROSTOMY RIGHT
1 series · 1 of 1 positions shown · non-contrast
Comparison: None.

INDICATION: [AGE] male with a history of right distal ureteral stricture.
He presents for routine nephrostomy tube exchange.

EXAM:
Nephrostomy tube exchange

[Series 300: ir nephrostomy exchange right · 1 of 1 slices shown]
[im 1/1]
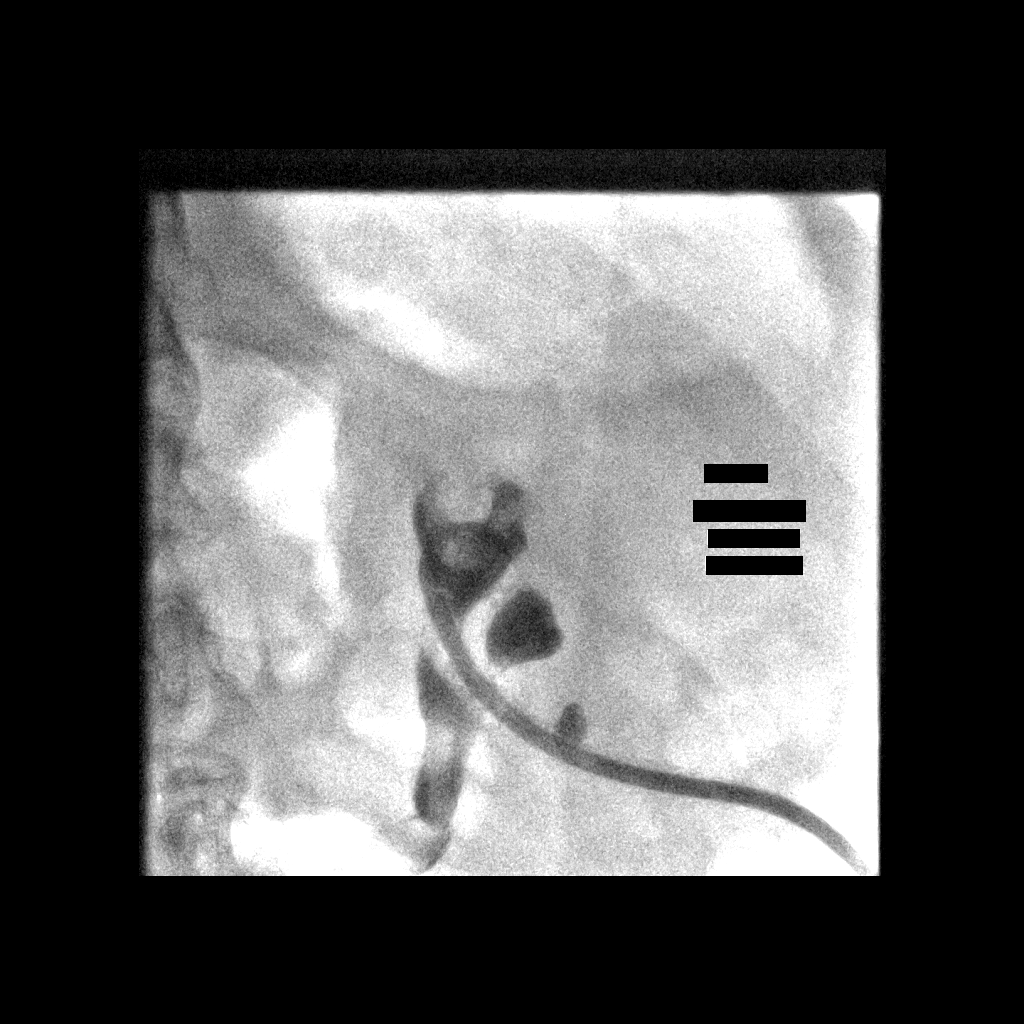

[1 of 1 positions shown; findings below may reference images not displayed]

MEDICATIONS:
None

ANESTHESIA/SEDATION:
None

CONTRAST:  8 mL Omnipaque 300-administered into the collecting
system(s)

FLUOROSCOPY TIME:  Fluoroscopy Time: 3 minutes 12 seconds (44 mGy).

COMPLICATIONS:
None immediate.

PROCEDURE:
Informed written consent was obtained from the patient after a
thorough discussion of the procedural risks, benefits and
alternatives. All questions were addressed. Maximal Sterile Barrier
Technique was utilized including caps, mask, sterile gowns, sterile
gloves, sterile drape, hand hygiene and skin antiseptic. A timeout
was performed prior to the initiation of the procedure.

A gentle hand injection of contrast material was performed through
the existing percutaneous nephrostomy tube. The tube has pulled back
into the lower pole calyx. The retention suture was cut. The tube
was transected and carefully manipulated back into the upper pole
infundibulum. The tube was then removed over a road runner wire.

Given the very small size of the patient's renal collecting system,
the decision was made to proceed with placement of Rodame Dulay
catheter.

Giorgi Jumper 10.2 Tripti Tiger drainage catheter was then
advanced over the wire and formed in the upper pole infundibulum. An
image was obtained and stored for the medical record.

The tube was secured to the skin with an adhesive fixation device.
IMPRESSION: Successful routine exchange of 10 French percutaneous nephrostomy
tube. Nomasibulele Moatshe tube was used given the small size of the
patient's renal collecting system.

## 2021-05-27 IMAGING — XA IR EXCHANGE NEPHROSTOMY RIGHT
1 series · 3 of 3 positions shown · non-contrast
Comparison: None.
COMPARISON: None.

Addendum:
INDICATION: [AGE] male with a history of right distal ureteral stricture.
He presents for routine nephrostomy tube exchange.

EXAM:
Nephrostomy tube exchange

[Series 300: tube placements · 3 of 3 slices shown]
[im 1/3]
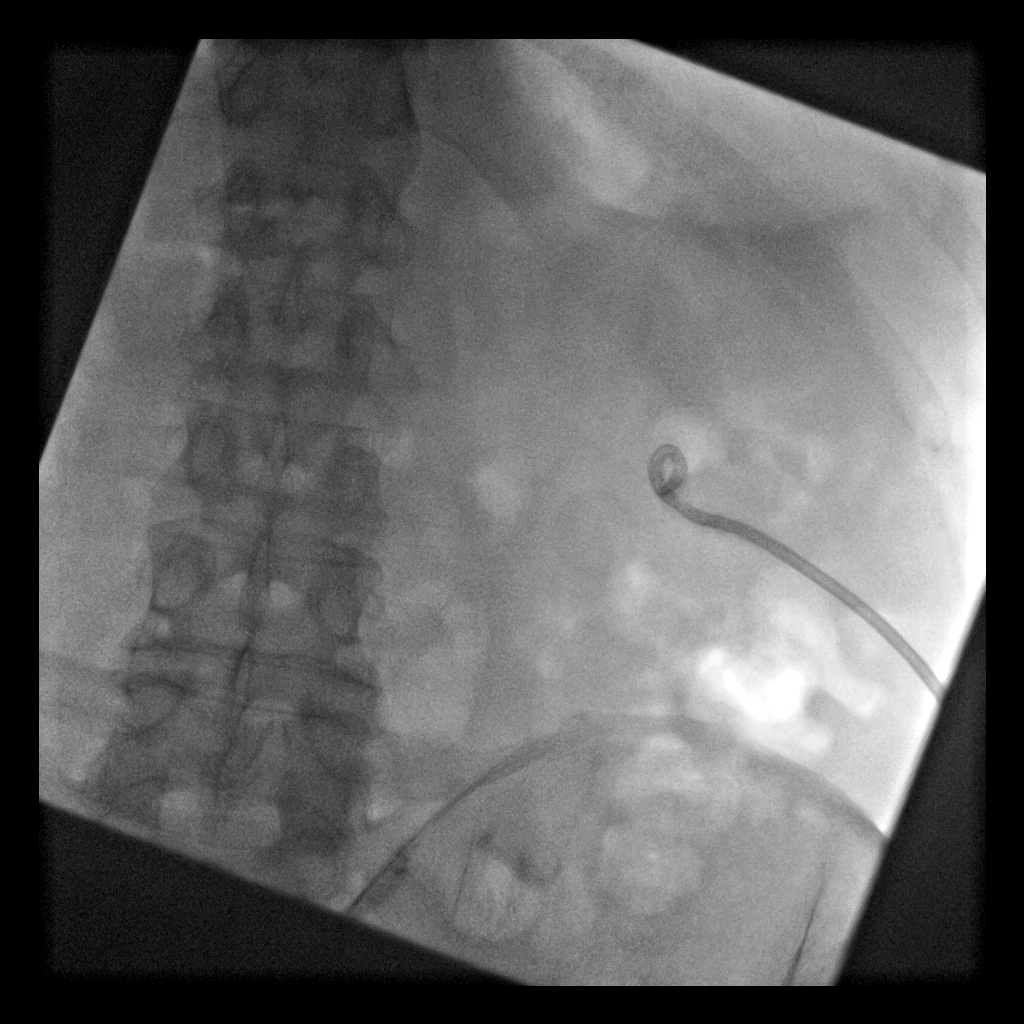
[im 2/3]
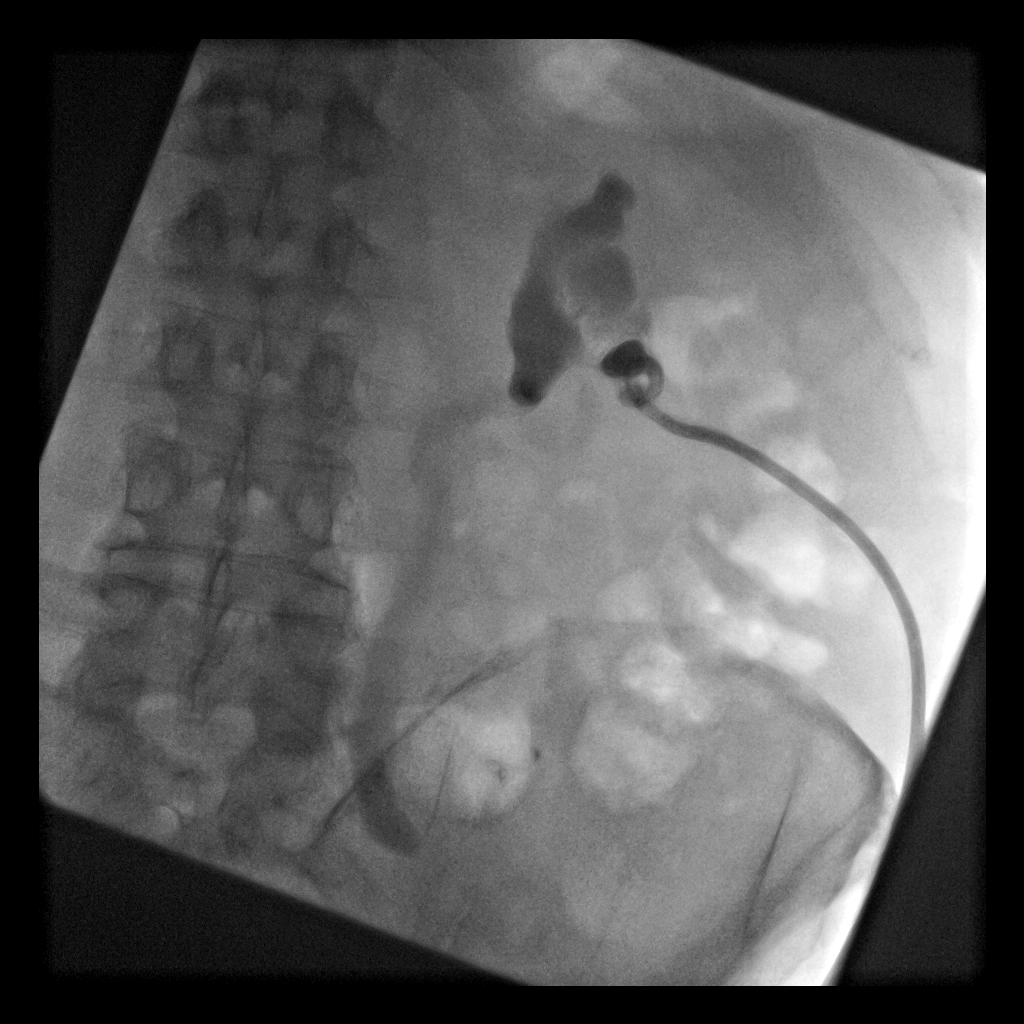
[im 3/3]
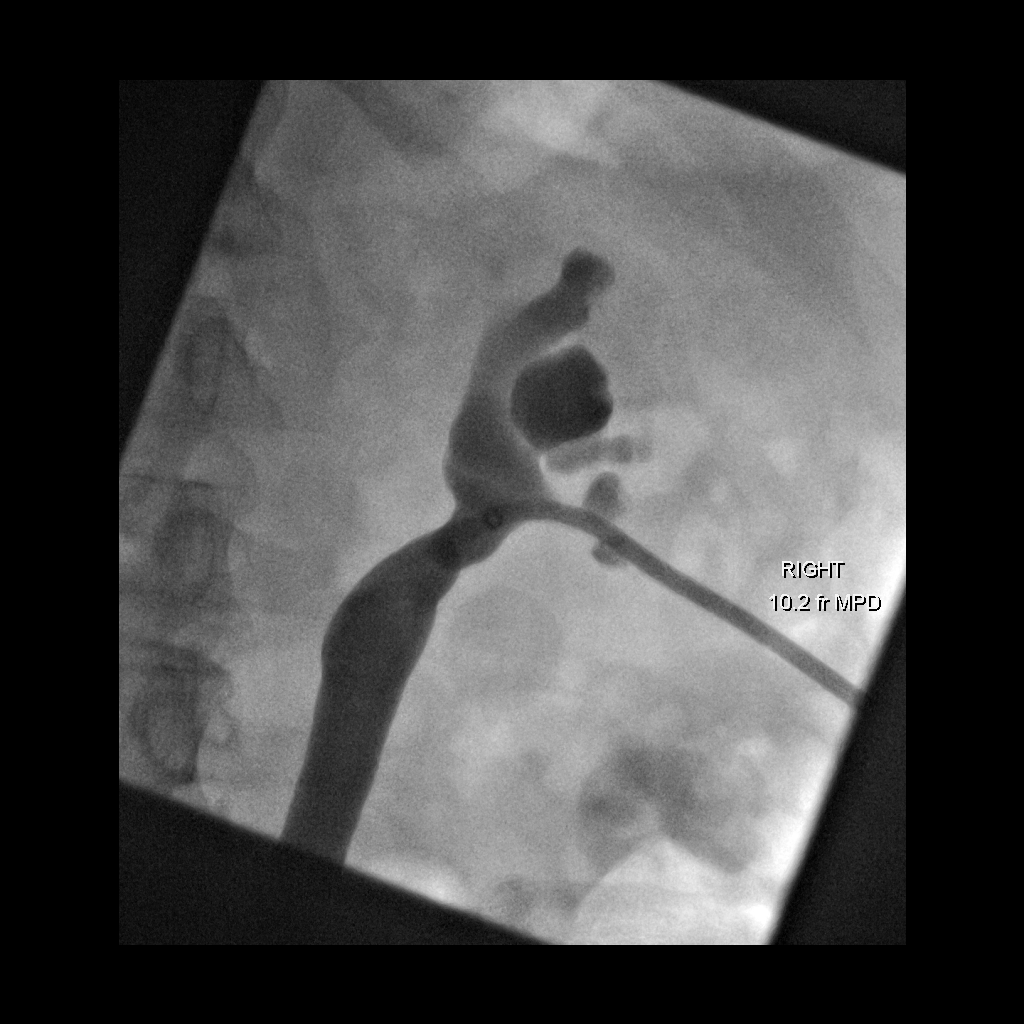

[3 of 3 positions shown; findings below may reference images not displayed]

MEDICATIONS:
None.

ANESTHESIA/SEDATION:
None.

CONTRAST:  10 mL Rsovue-7PP-administered into the collecting
system(s)

FLUOROSCOPY TIME:  Fluoroscopy Time:  minutes  seconds ( mGy).

COMPLICATIONS:
None immediate.

PROCEDURE:
Informed written consent was obtained from the patient after a
thorough discussion of the procedural risks, benefits and
alternatives. All questions were addressed. Maximal Sterile Barrier
Technique was utilized including caps, mask, sterile gowns, sterile
gloves, sterile drape, hand hygiene and skin antiseptic. A timeout
was performed prior to the initiation of the procedure.

A hand injection of contrast was performed. The existing Aida
Claus percutaneous nephrostomy tube has pulled back into the lower
pole calyx.

The tube was transected and removed over a road runner wire. A 5
French catheter was then navigated over the wire and used to
redirect the wire through the calyx, lower pole infundibulum and
into the proximal ureter.

Osniel Socha 10.2 French standard all-purpose drainage catheter was
then advanced over the wire and formed in the dilated proximal
ureter. Efforts were made to perform the tube in the renal pelvis,
however this was not possible due to the small size of the renal
pelvis. The tube was connected to gravity bag drainage and secured
to the skin with 0 Prolene suture.
IMPRESSION: Successful exchange for a new 10 French right-sided percutaneous
nephrostomy tube.

FLUOROSCOPY TIME:  5 minutes 24 seconds 86 mGy

*** End of Addendum ***
MEDICATIONS:
None.

ANESTHESIA/SEDATION:
None.

CONTRAST:  10 mL Rsovue-7PP-administered into the collecting
system(s)

FLUOROSCOPY TIME:  Fluoroscopy Time:  minutes  seconds ( mGy).

COMPLICATIONS:
None immediate.

PROCEDURE:
Informed written consent was obtained from the patient after a
thorough discussion of the procedural risks, benefits and
alternatives. All questions were addressed. Maximal Sterile Barrier
Technique was utilized including caps, mask, sterile gowns, sterile
gloves, sterile drape, hand hygiene and skin antiseptic. A timeout
was performed prior to the initiation of the procedure.

A hand injection of contrast was performed. The existing Aida
Claus percutaneous nephrostomy tube has pulled back into the lower
pole calyx.

The tube was transected and removed over a road runner wire. A 5
French catheter was then navigated over the wire and used to
redirect the wire through the calyx, lower pole infundibulum and
into the proximal ureter.

Osniel Socha 10.2 French standard all-purpose drainage catheter was
then advanced over the wire and formed in the dilated proximal
ureter. Efforts were made to perform the tube in the renal pelvis,
however this was not possible due to the small size of the renal
pelvis. The tube was connected to gravity bag drainage and secured
to the skin with 0 Prolene suture.
IMPRESSION: Successful exchange for a new 10 French right-sided percutaneous
nephrostomy tube.

## 2021-07-08 IMAGING — XA IR EXCHANGE NEPHROSTOMY RIGHT
4 series · 12 of 17 positions shown · non-contrast
Comparison: None.

INDICATION: [AGE] with history of bladder cancer and cystectomy. History
of ureter stricture with a right nephrostomy tube. Patient presents
for right nephrostomy tube exchange.

EXAM:
RIGHT NEPHROSTOMY TUBE EXCHANGE WITH FLUOROSCOPY

[Series 1: fl - angio · 3 of 22 frames shown (1 of 3)]
[frame 4/22]
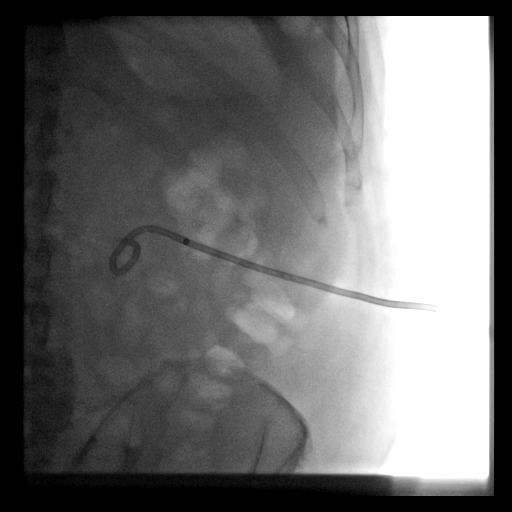
[frame 19/22]
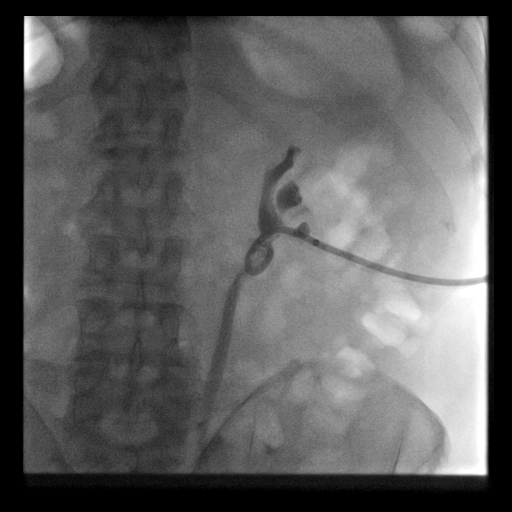
[frame 22/22]
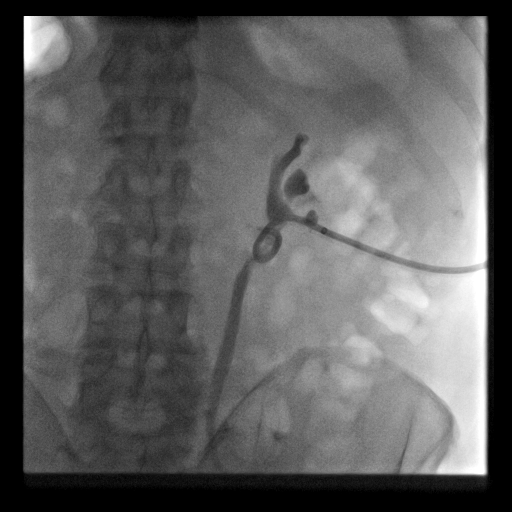

[Series 2: fl - angio · 3 of 113 frames shown (2 of 3)]
[frame 17/113]
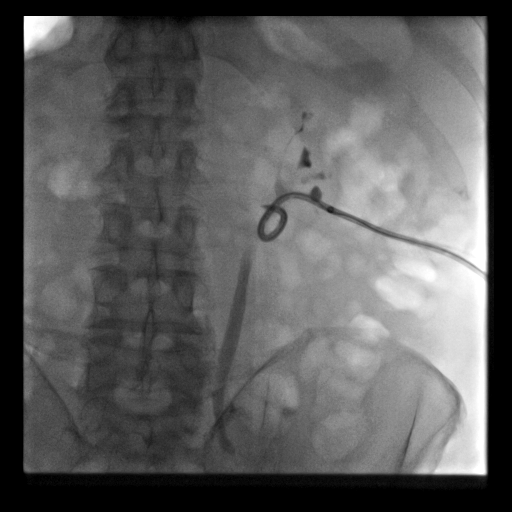
[frame 97/113]
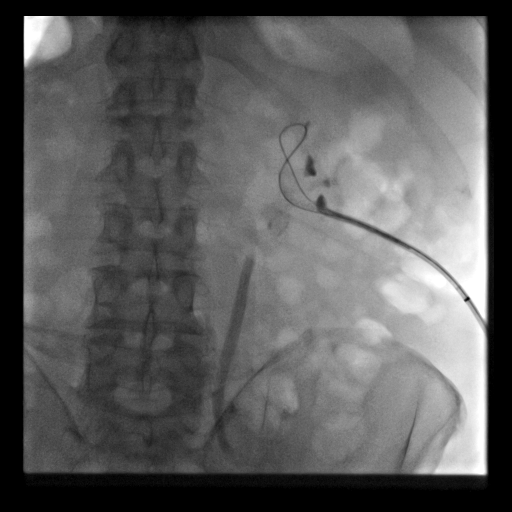
[frame 113/113]
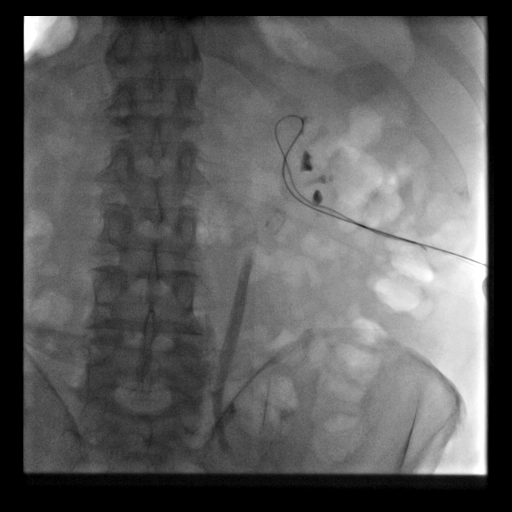

[Series 3: fl - angio · 2 of 36 frames shown (3 of 3)]
[frame 8/36]
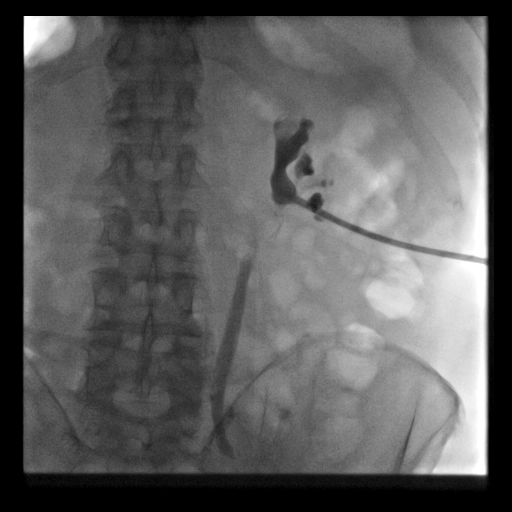
[frame 19/36]
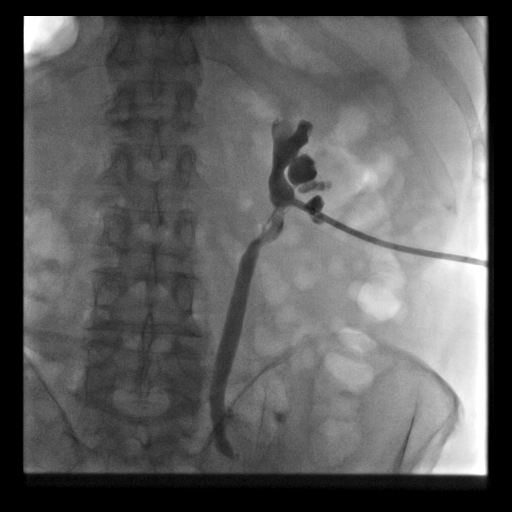

[Series 300: tube placements · 4 of 5 slices shown]
[im 1/5]
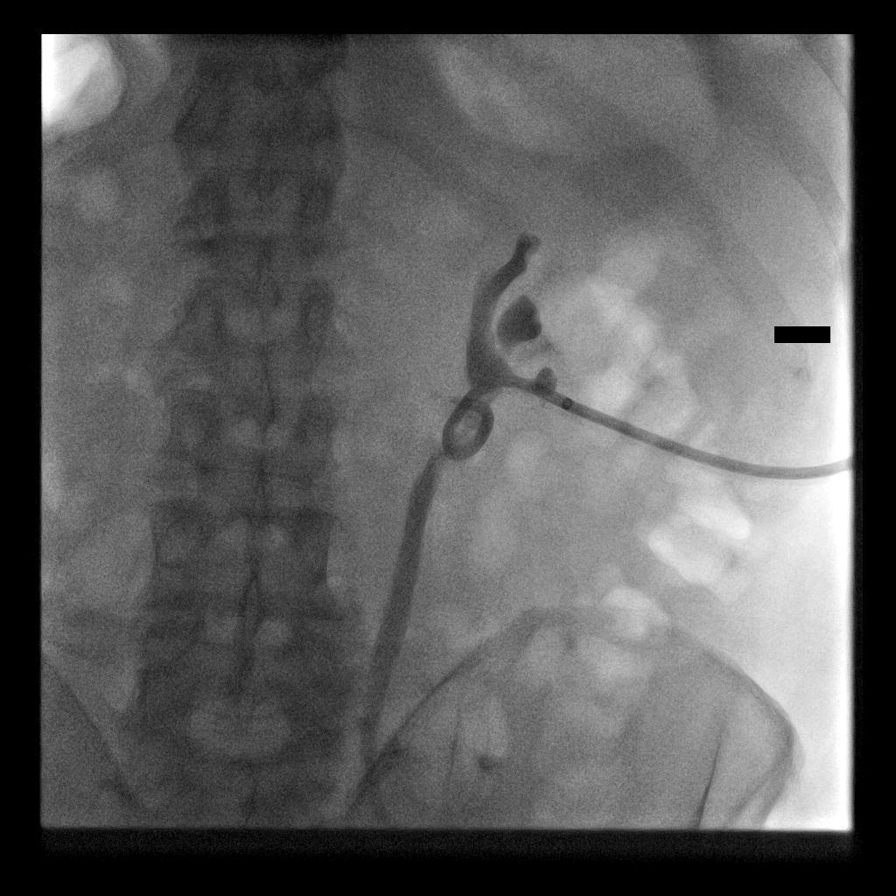
[im 2/5]
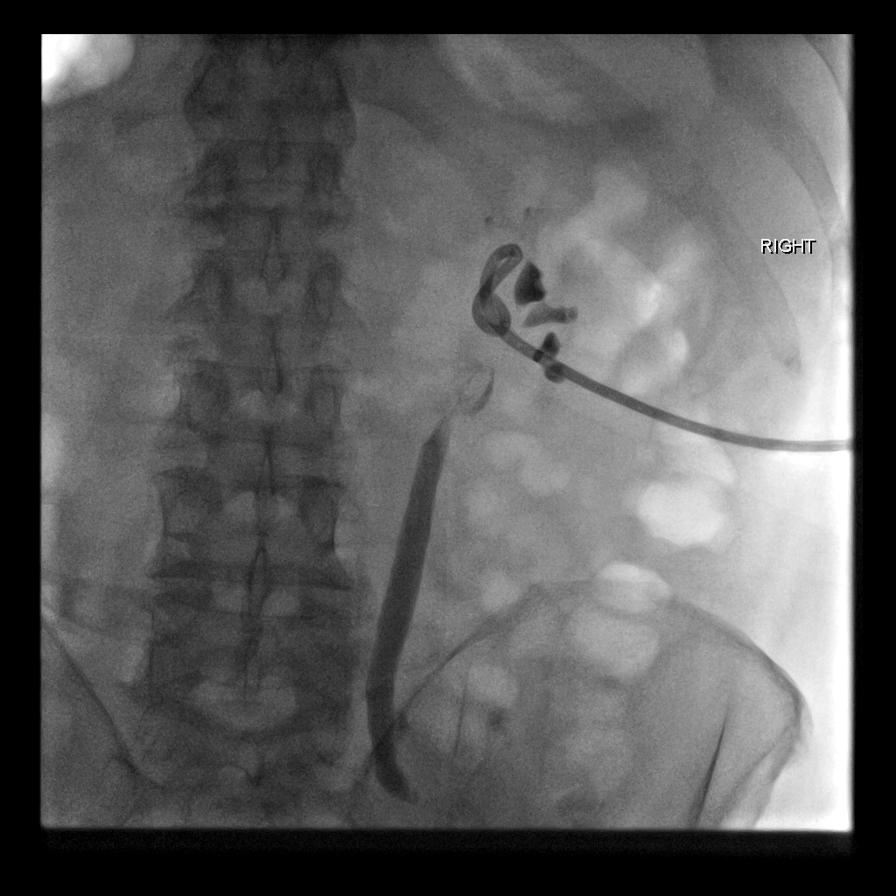
[im 3/5]
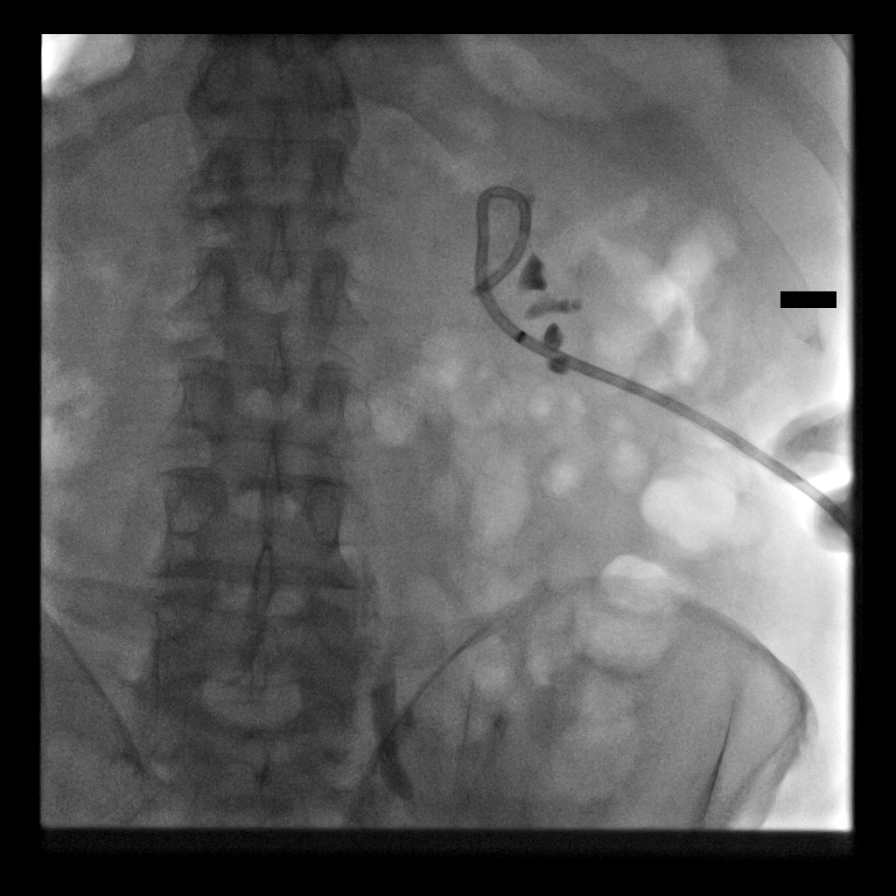
[im 5/5]
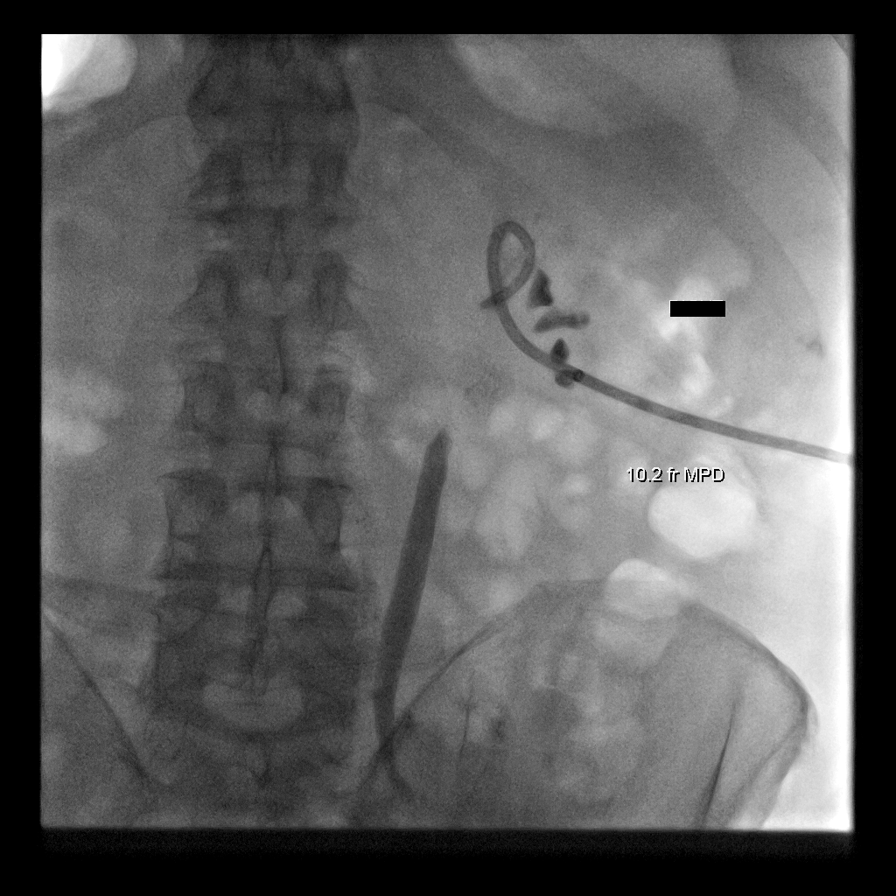

[12 of 17 positions shown; findings below may reference images not displayed]

MEDICATIONS:
None

ANESTHESIA/SEDATION:
None

CONTRAST:  10mL OMNIPAQUE IOHEXOL 300 MG/ML SOLN - administered into
the collecting system(s)

FLUOROSCOPY TIME:  Fluoroscopy Time: 3 minutes and 18 seconds, 18
mGy

COMPLICATIONS:
None immediate.

PROCEDURE:
Informed consent was obtained for right nephrostomy tube exchange.
Patient was placed prone. The right flank and nephrostomy tube were
prepped and draped in sterile fashion. Contrast injection confirmed
placement in the renal pelvis. Catheter was cut and removed over
Bentson wire. New 10 French nephrostomy tube was placed but it was
difficult to reconstituted in the renal pelvis. Attempted to
reconstitute the catheter in the renal pelvis after the wire was
positioned down the ureter. Eventually, the catheter was coiled in
the upper pole calyx. Catheter was secured to skin with StatLock.
FINDINGS: Small renal pelvis with possible filling defects in the renal pelvic
region. New catheter is adequately positioned in an upper pole
calyx.
IMPRESSION: Successful exchange of right nephrostomy tube with fluoroscopy.

Questionable filling defects in the renal pelvis. Patient may
benefit from [place] Annes catheter in the future. Attention to
the renal pelvis on follow-up exchange.

## 2021-08-19 IMAGING — XA IR EXCHANGE NEPHROSTOMY RIGHT
3 series · 14 of 14 positions shown · non-contrast
Comparison: None.

INDICATION: [AGE] with history of bladder cancer and chronic right
nephrostomy tube due to a ureter stricture. Patient presents for
routine tube exchange.

EXAM:
EXCHANGE OF RIGHT NEPHROSTOMY TUBE WITH FLUOROSCOPY

[Series 1: fl - angio · 4 of 84 frames shown (1 of 2)]
[frame 13/84]
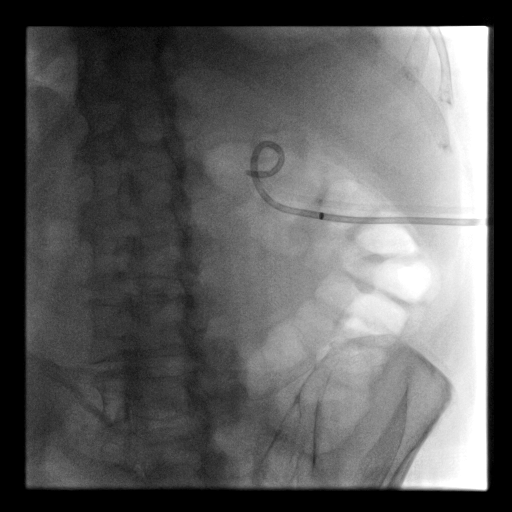
[frame 43/84]
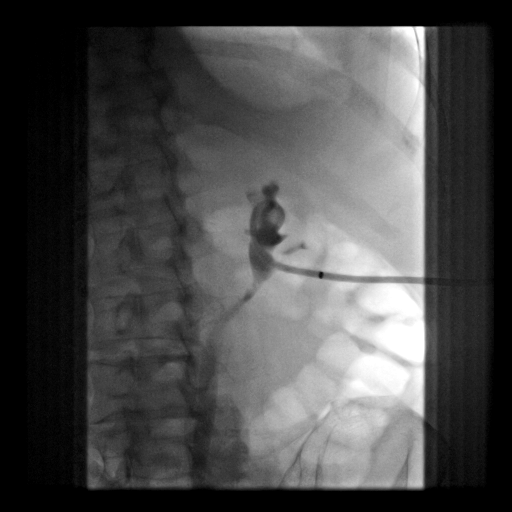
[frame 67/84]
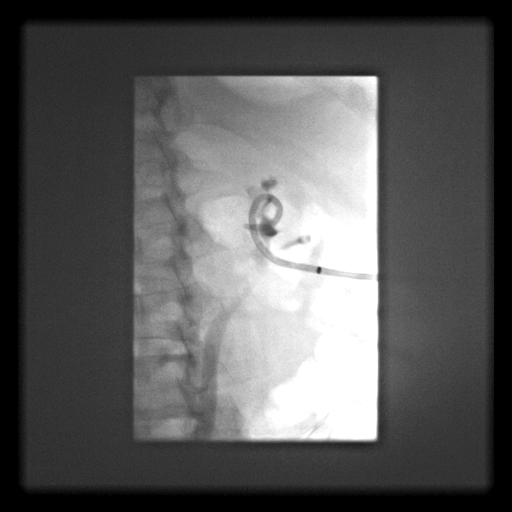
[frame 72/84]
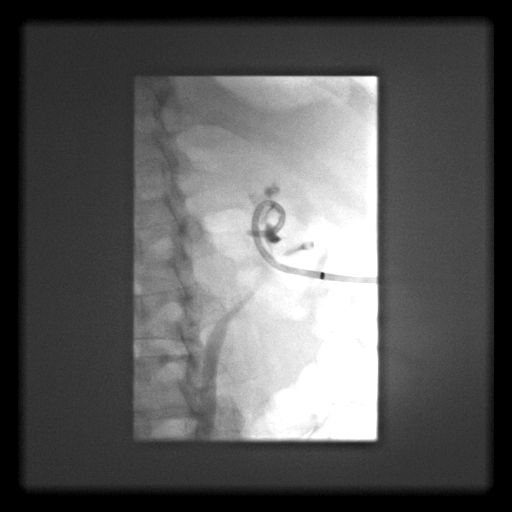

[Series 4: fl - angio · 4 of 40 frames shown (2 of 2)]
[frame 7/40]
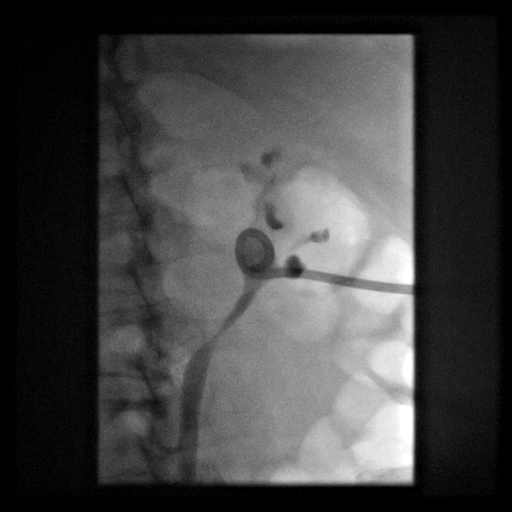
[frame 10/40]
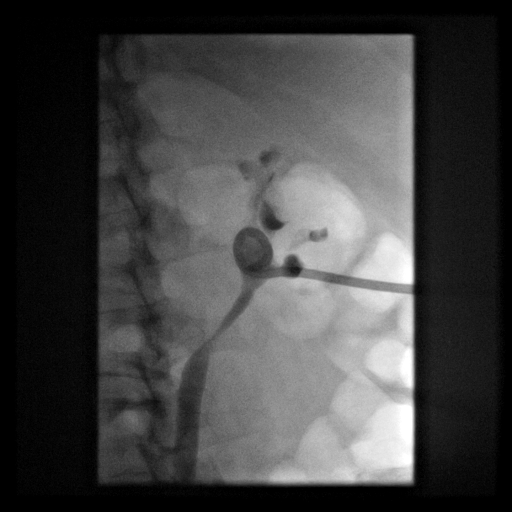
[frame 21/40]
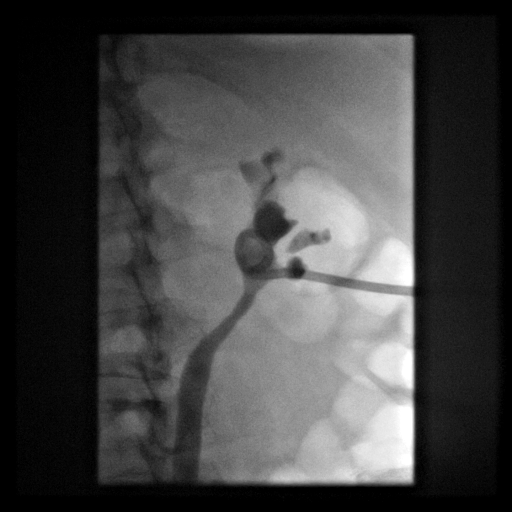
[frame 35/40]
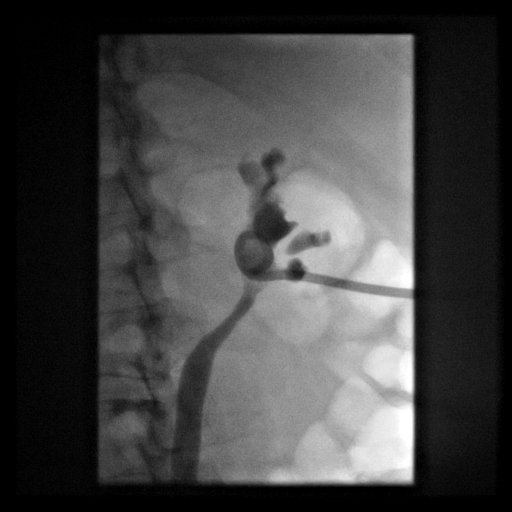

[Series 300: ir nephrostomy exchange right · 6 of 6 slices shown]
[im 1/6]
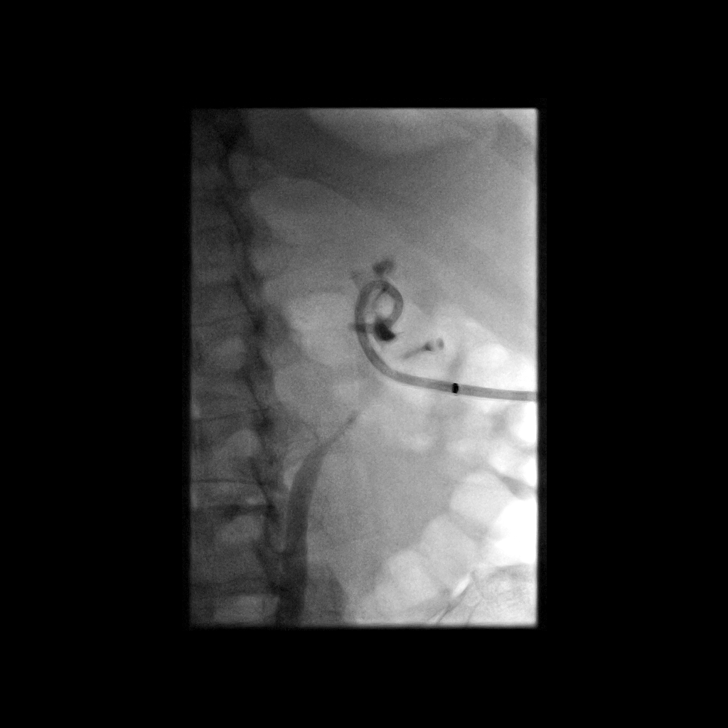
[im 2/6]
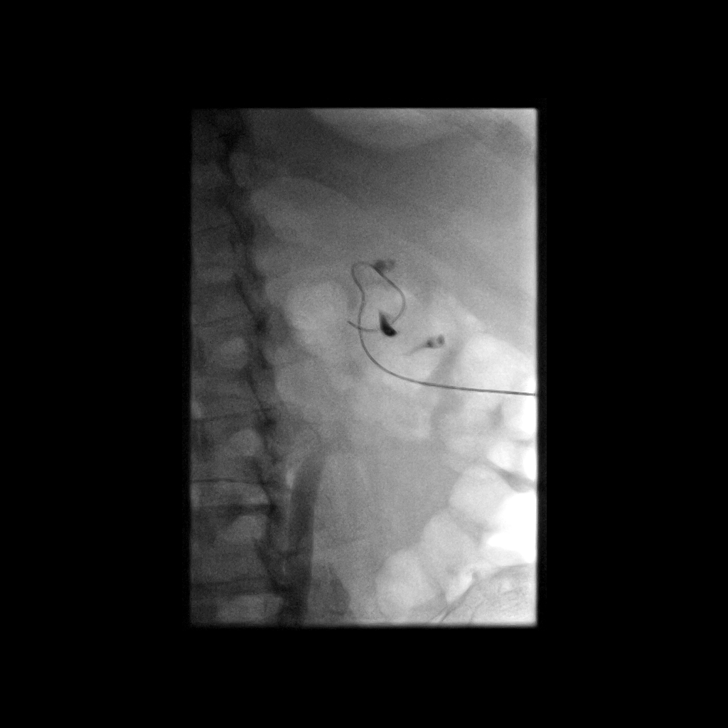
[im 3/6]
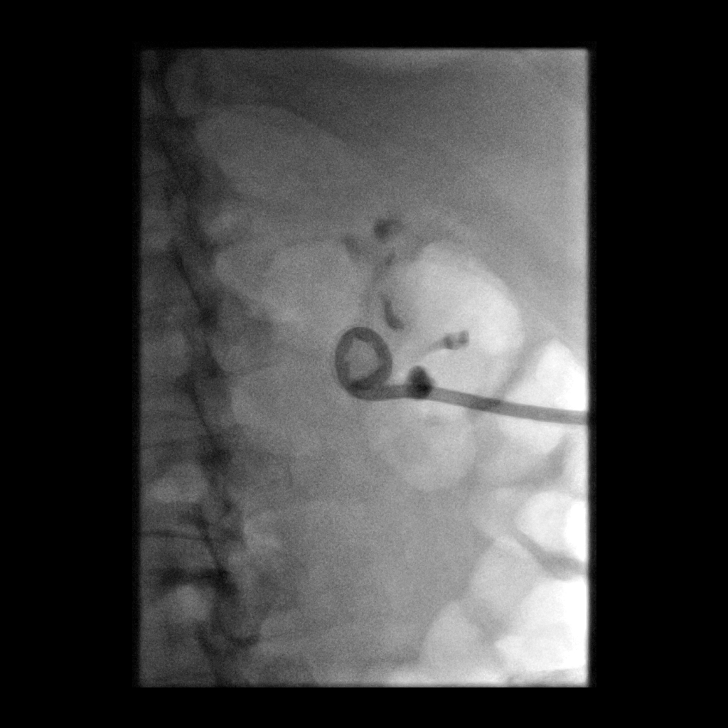
[im 4/6]
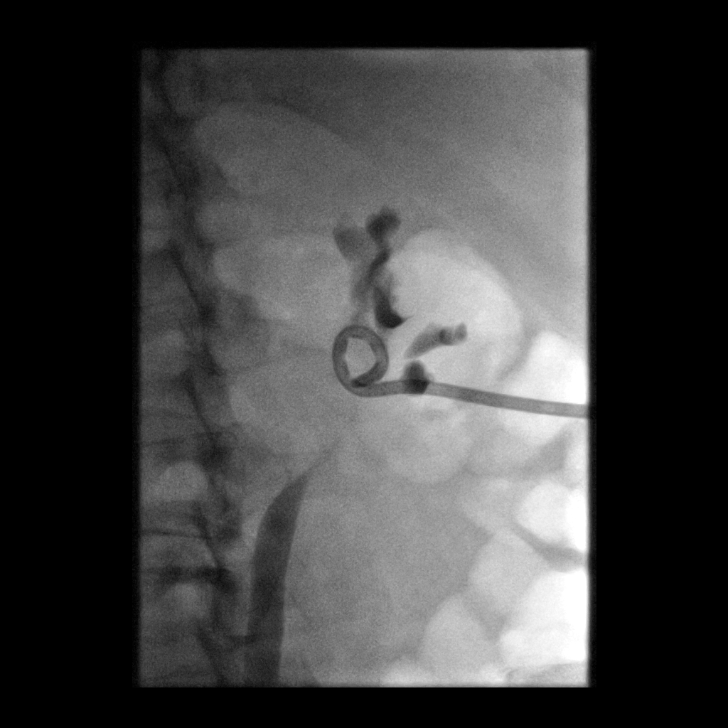
[im 5/6]
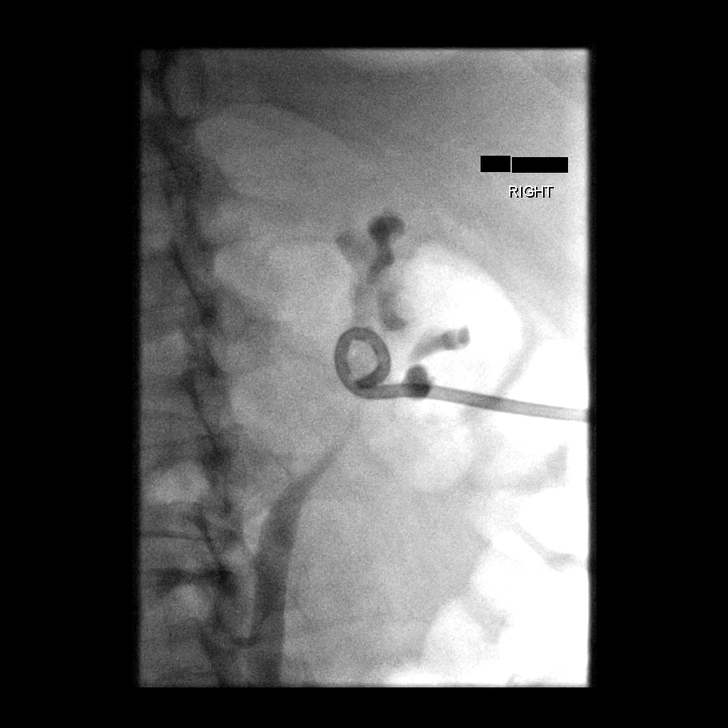
[im 6/6]
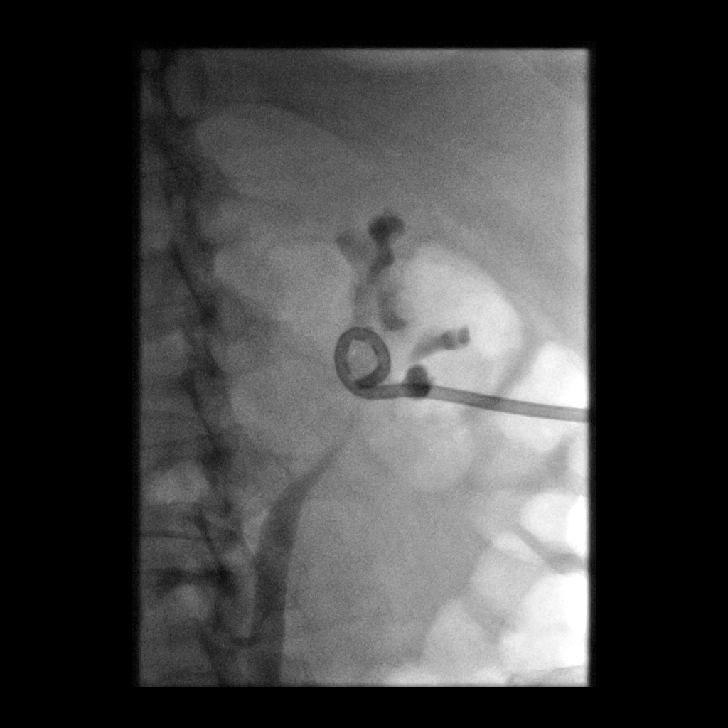

[14 of 14 positions shown; findings below may reference images not displayed]

MEDICATIONS:
None

ANESTHESIA/SEDATION:
None

CONTRAST:  15mL OMNIPAQUE IOHEXOL 300 MG/ML SOLN - administered into
the collecting system(s)

FLUOROSCOPY TIME:  Fluoroscopy Time: 3 minutes, 36 seconds, 31 mGy

COMPLICATIONS:
None immediate.

PROCEDURE:
Informed consent was obtained for right nephrostomy tube exchange.
The right flank and existing tube were prepped and draped in a
sterile fashion. Maximal barrier sterile technique was utilized
including caps, mask, sterile gowns, sterile gloves, sterile drape,
hand hygiene and skin antiseptic. Contrast injection confirmed
placement in the renal pelvis. The catheter was cut and removed over
a Bentson wire. A 10 French multipurpose drain would not form in the
small collecting system. Therefore, a Kumpe catheter was advanced
down the ureter with a Bentson wire. A 10 Noholo Guje
catheter was placed over the wire and formed in the renal pelvis.
Contrast injection confirmed placement in the renal pelvis.
IMPRESSION: Successful exchange of the right nephrostomy tube. Patient now has a
10 Noholo Guje catheter.

## 2021-09-30 IMAGING — XA IR EXCHANGE NEPHROSTOMY RIGHT
3 series · 4 of 4 positions shown · non-contrast
Comparison: Prior routine nephrostomy tube exchange 11/01/2019

INDICATION: [AGE] male with chronic indwelling right-sided percutaneous
nephrostomy tube. He presents for routine tube exchange.

EXAM:
Nephrostomy tube exchange

[Series 1: fl - angio · 1 of 1 slices shown (1 of 3)]
[im 1/1]
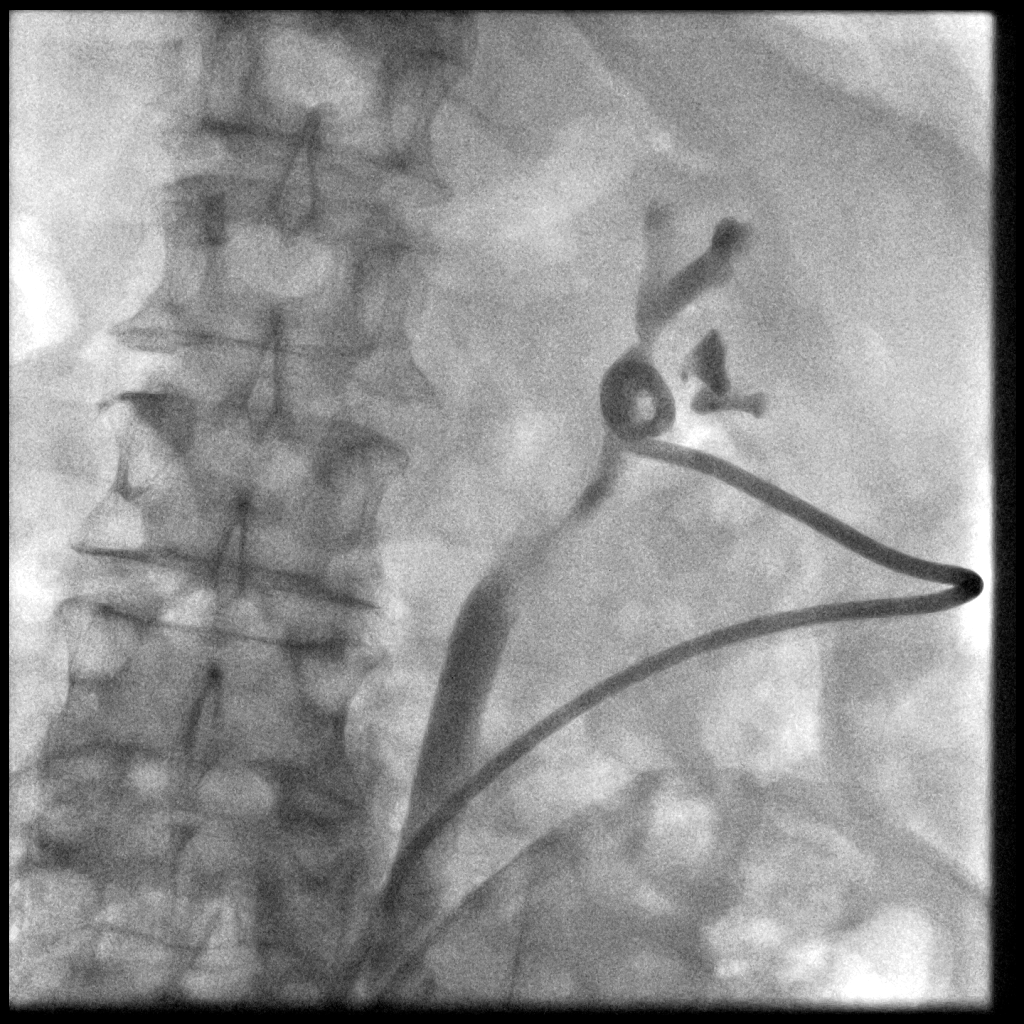

[Series 2: fl - angio · 1 of 1 slices shown (2 of 3)]
[im 1/1]
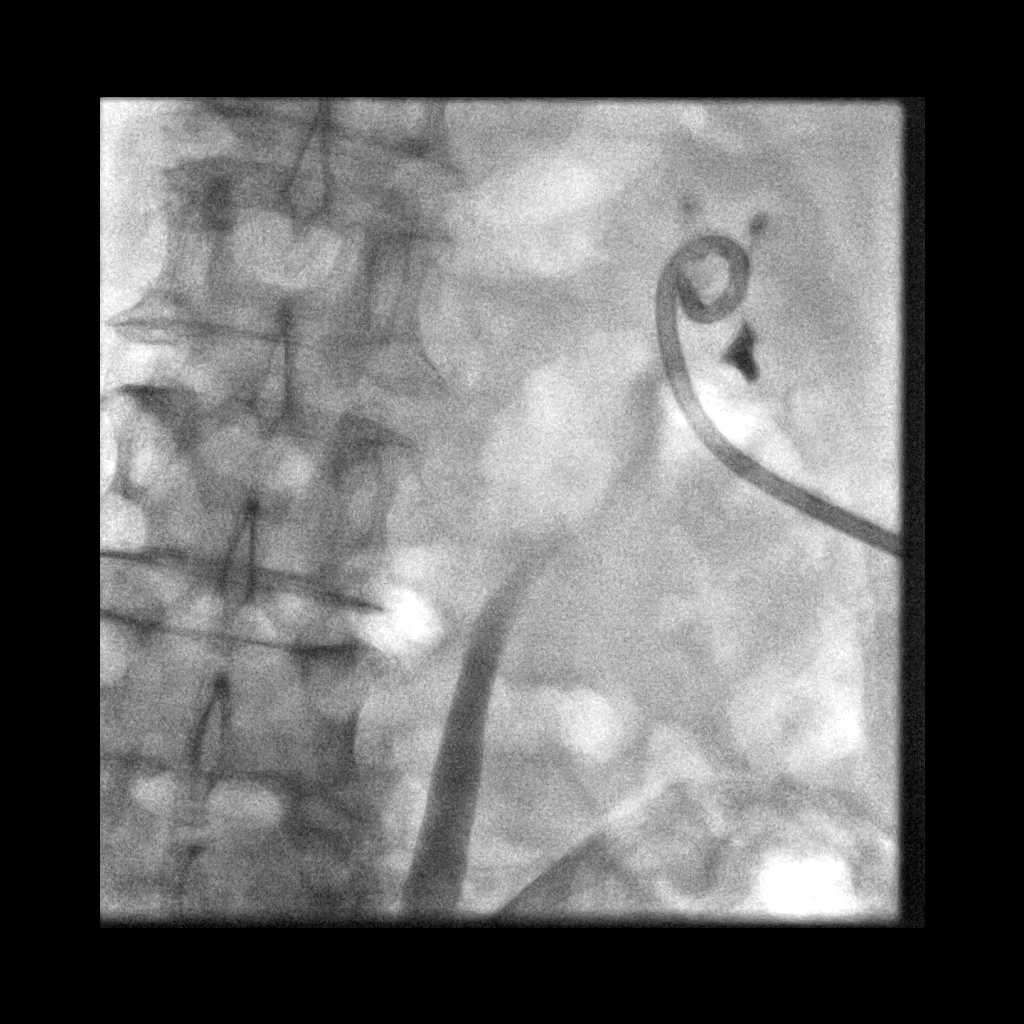

[Series 3: fl - angio · 2 of 2 slices shown (3 of 3)]
[im 1/2]
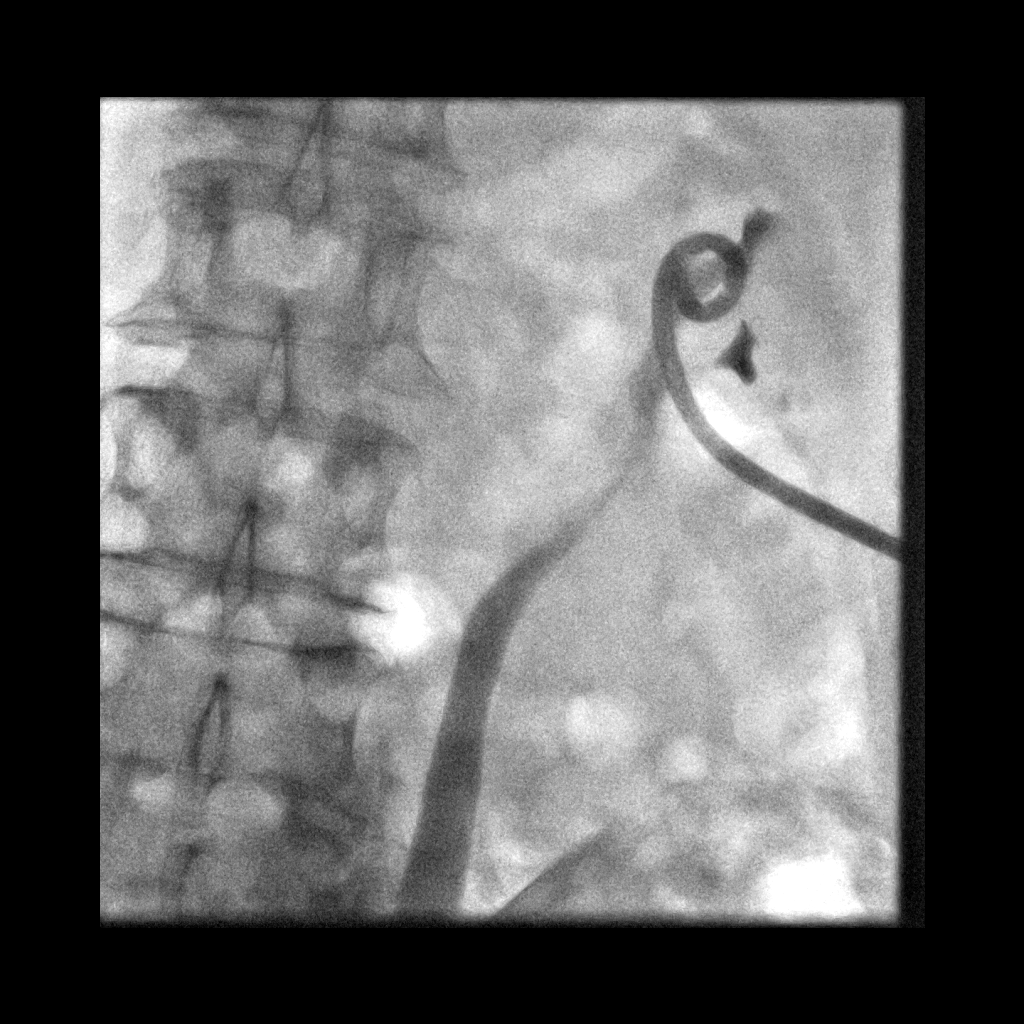
[im 2/2]
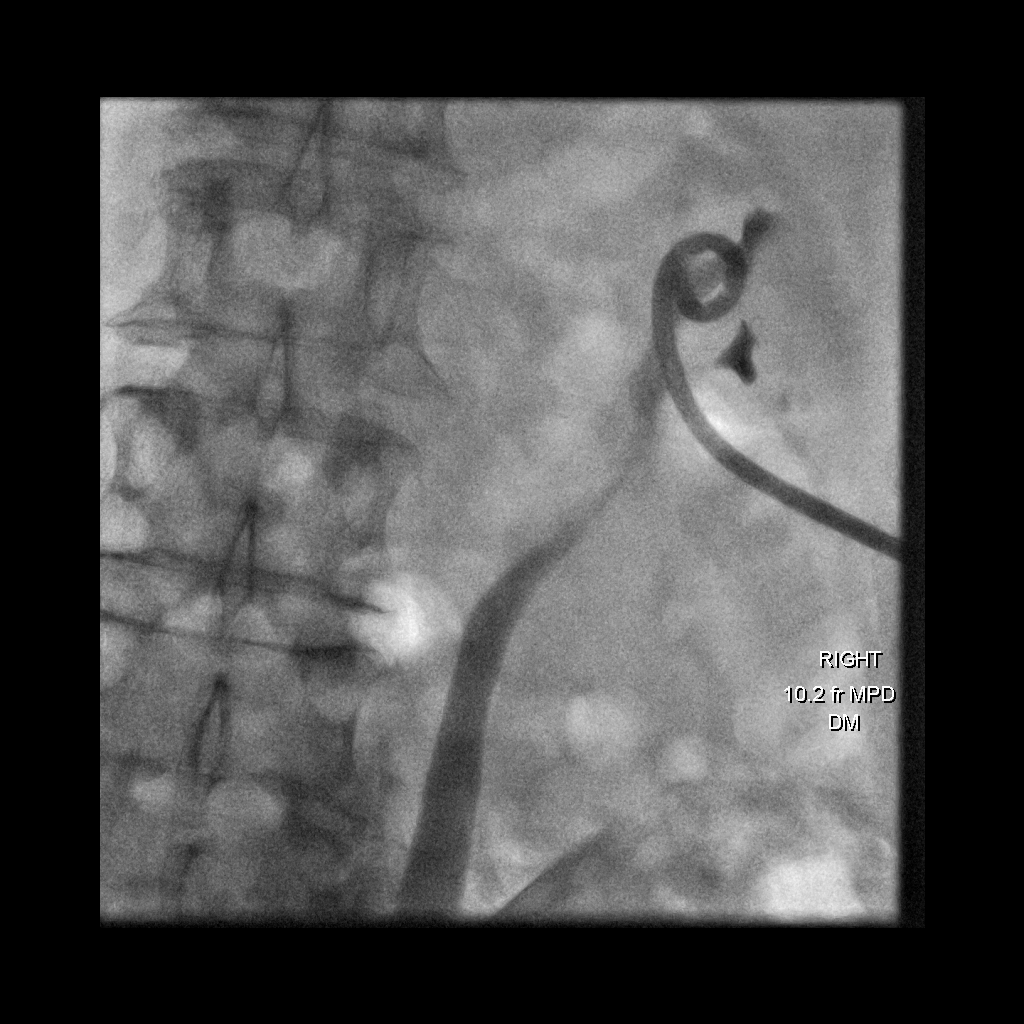

[4 of 4 positions shown; findings below may reference images not displayed]

MEDICATIONS:
None

ANESTHESIA/SEDATION:
None

CONTRAST:  7 mL Omnipaque 300-administered into the collecting
system(s)

FLUOROSCOPY TIME:  Fluoroscopy Time: 2 minutes 6 seconds (45 mGy).

COMPLICATIONS:
None immediate.

PROCEDURE:
Informed written consent was obtained from the patient after a
thorough discussion of the procedural risks, benefits and
alternatives. All questions were addressed. Maximal Sterile Barrier
Technique was utilized including caps, mask, sterile gowns, sterile
gloves, sterile drape, hand hygiene and skin antiseptic. A timeout
was performed prior to the initiation of the procedure.

A gentle hand injection of contrast through the existing
percutaneous nephrostomy tube was performed. The Kornchanok Muehl
catheter is well positioned in the renal pelvis. The catheter was
transected and removed over a road runner wire. A new 10.2 Cerda
Garish Marabuto drainage catheter was then advanced over the
wire and formed, this time in the upper pole infundibulum. The
catheter was secured to the skin with an adhesive fixation device.
Contrast injection was performed confirming good placement of the
tube. There were no complications.
IMPRESSION: Successful routine exchange of 10.2 Semir Amina Bartholsen
percutaneous nephrostomy tube.

## 2021-11-13 IMAGING — XA IR EXCHANGE NEPHROSTOMY RIGHT
3 series · 12 of 12 positions shown · non-contrast
Comparison: Multiple previous fluoroscopic guided nephrostomy
catheter exchanges, most recently on 12/13/2018

INDICATION: Routine exchange.

EXAM:
FLUOROSCOPIC GUIDED RIGHT SIDED NEPHROSTOMY CATHETER EXCHANGE
TECHNIQUE: Informed written consent was obtained from the patient after a
discussion of the risks, benefits and alternatives to treatment.
Questions regarding the procedure were encouraged and answered. A
timeout was performed prior to the initiation of the procedure.

[Series 2: fl - angio · 4 of 24 frames shown (1 of 2)]
[frame 4/24]
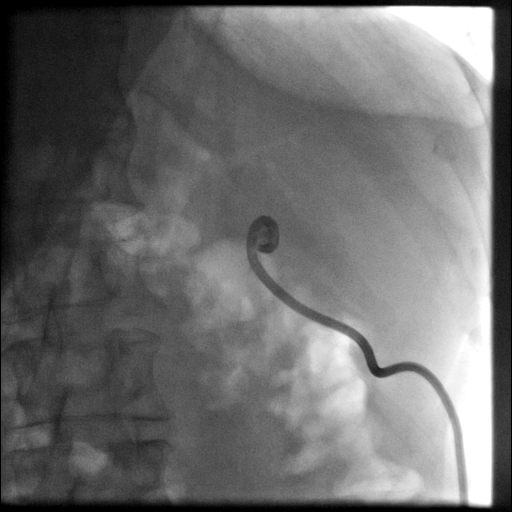
[frame 13/24]
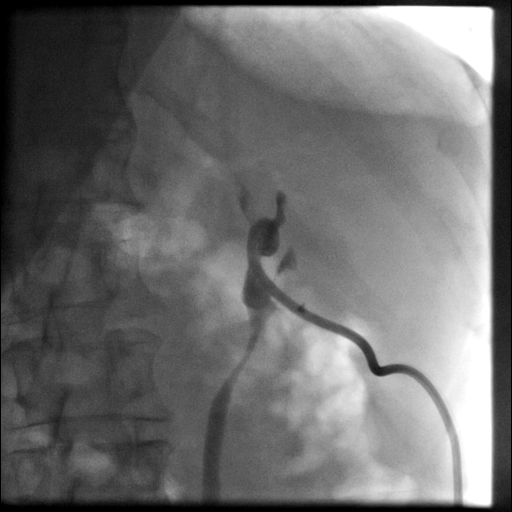
[frame 18/24]
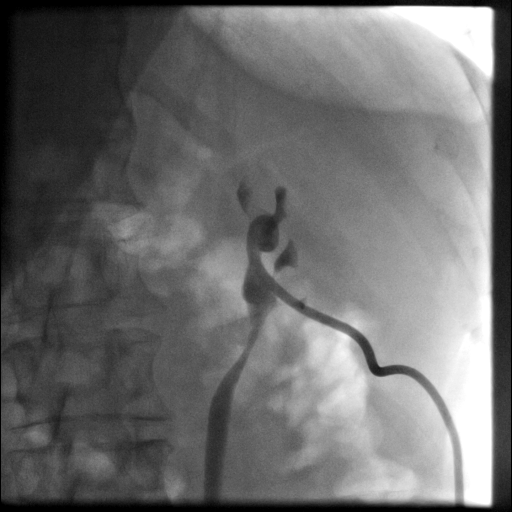
[frame 21/24]
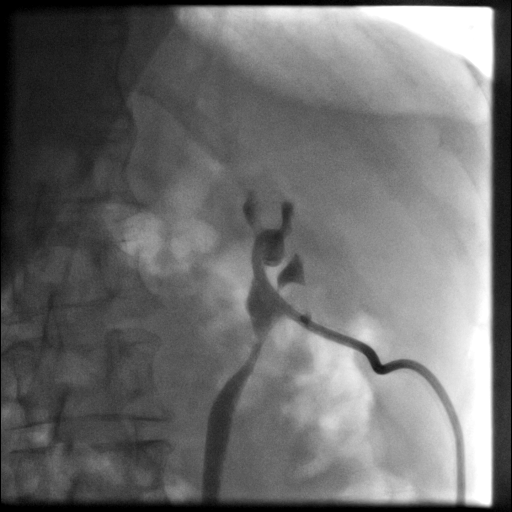

[Series 4: fl - angio · 4 of 22 frames shown (2 of 2)]
[frame 4/22]
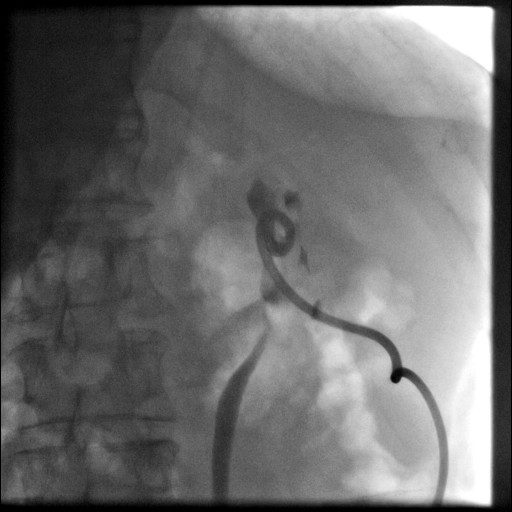
[frame 12/22]
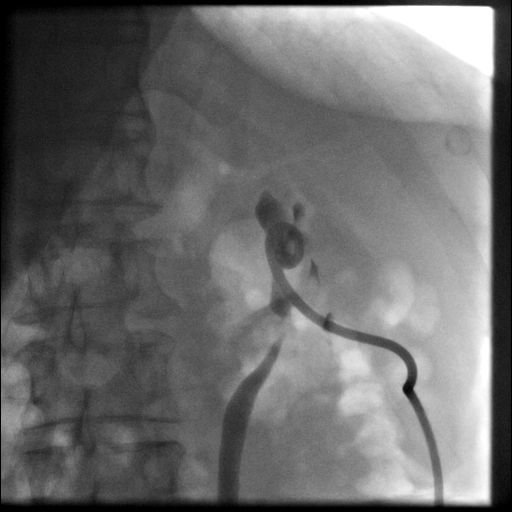
[frame 19/22]
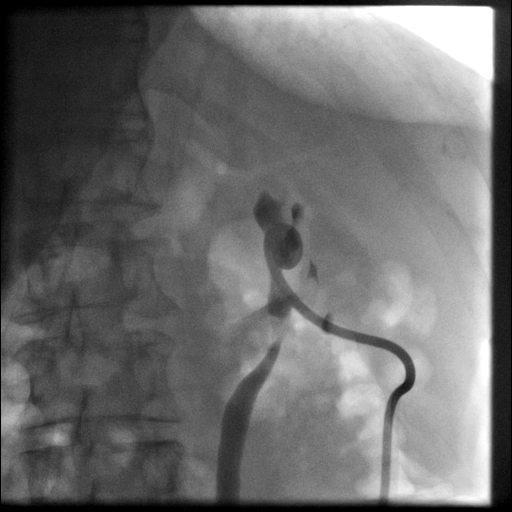
[frame 22/22]
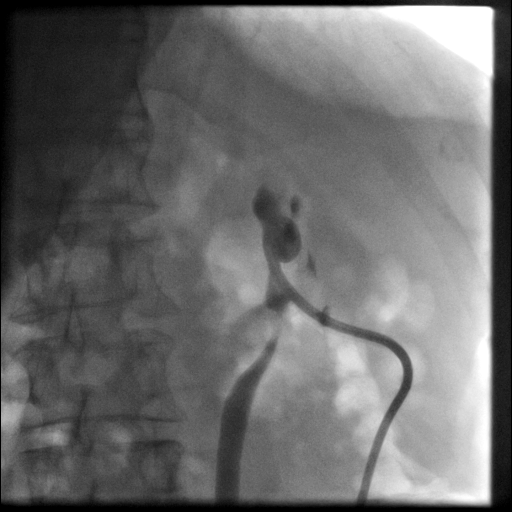

[Series 300: tube placements · 4 of 4 slices shown]
[im 1/4]
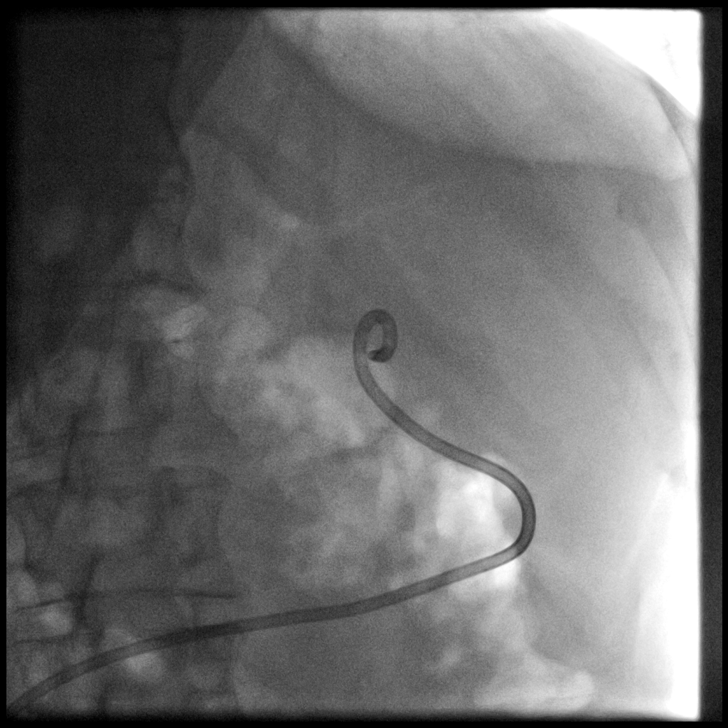
[im 2/4]
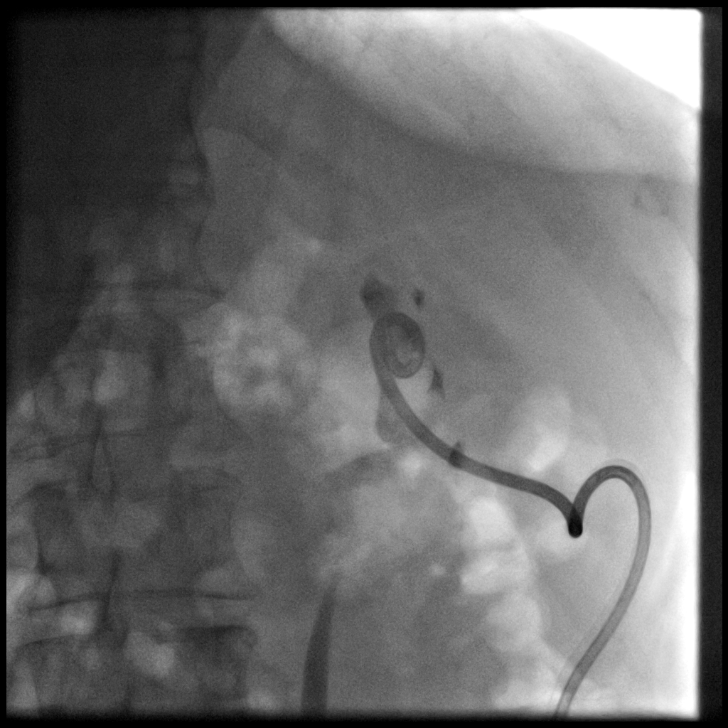
[im 3/4]
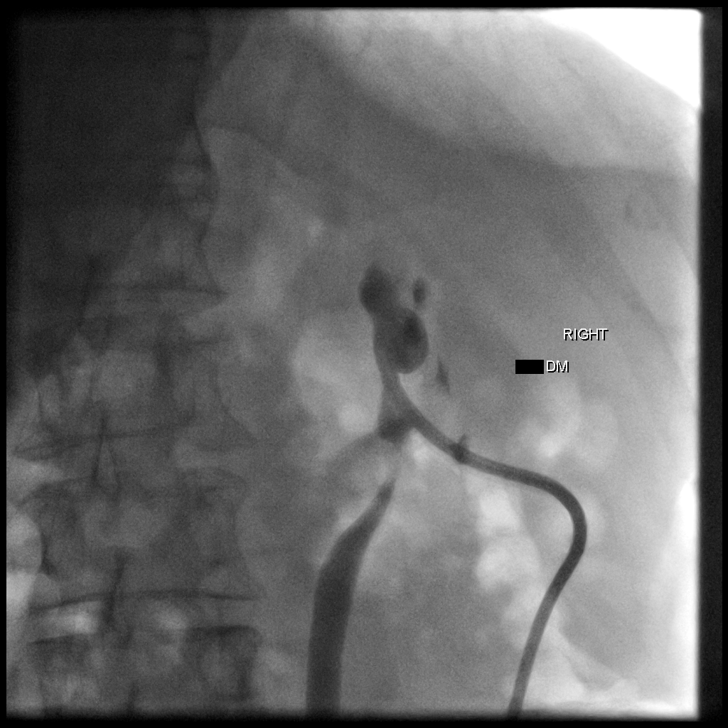
[im 4/4]
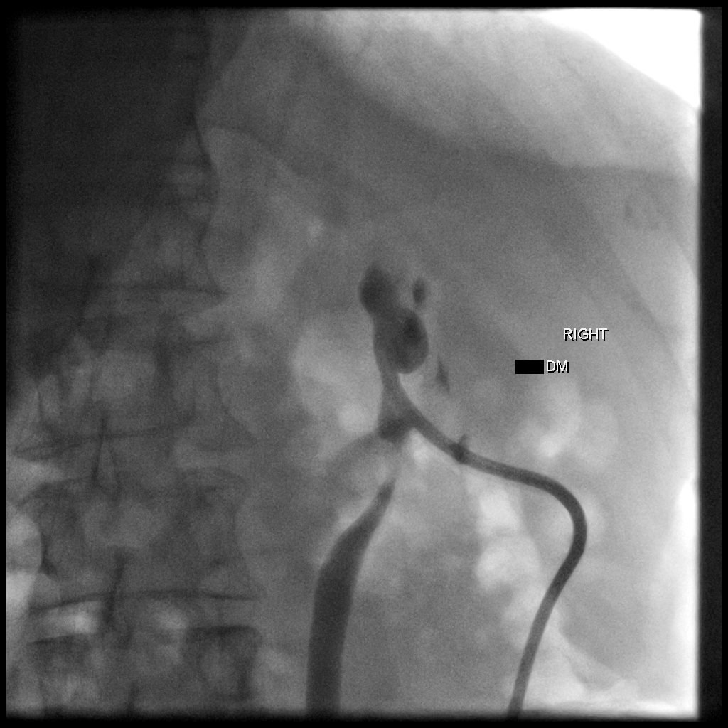

[12 of 12 positions shown; findings below may reference images not displayed]

CONTRAST:  20 mL Zsovue-4JJ administered into the collecting system

FLUOROSCOPY TIME:  30 seconds (20 mGy)

COMPLICATIONS:
None immediate.
The right flank and external portion of existing nephrostomy
catheter were prepped and draped in the usual sterile fashion. A
sterile drape was applied covering the operative field. Maximum
barrier sterile technique with sterile gowns and gloves were used
for the procedure. A timeout was performed prior to the initiation
of the procedure.

A pre procedural spot fluoroscopic image was obtained after contrast
was injected via the existing nephrostomy catheter demonstrating
appropriate positioning within the renal pelvis. The existing
nephrostomy catheter was cut and cannulated with a Benson wire which
was coiled within the renal pelvis. Under intermittent fluoroscopic
guidance, the existing nephrostomy catheter was exchanged for a new
10.2 Shalu Roby type all-purpose drainage catheter.
Contrast injection confirmed appropriate positioning within the
renal pelvis and a post exchange fluoroscopic image was obtained.
The catheter was locked and secured to the skin with a suture. A
dressing was placed. The patient tolerated the procedure well
without immediate postprocedural complication.
FINDINGS: The existing nephrostomy catheter is appropriately positioned and
functioning.

After successful fluoroscopic guided exchange, the Paulus N Ceejay
Mazzonick type nephrostomy catheter is coiled and locked within the
right renal pelvis.
IMPRESSION: Successful fluoroscopic guided exchange of right sided 10.2 Tireviciene
Lagoon Azom type percutaneous nephrostomy catheter.

## 2021-12-27 IMAGING — XA IR EXCHANGE NEPHROSTOMY RIGHT
1 series · 3 of 3 positions shown · non-contrast
Comparison: None.

INDICATION: [AGE] male who presents for routine exchange of a chronic
indwelling right-sided percutaneous nephrostomy tube.

EXAM:
Nephrostomy tube exchange

[Series 300: tube placements · 3 of 3 slices shown]
[im 1/3]
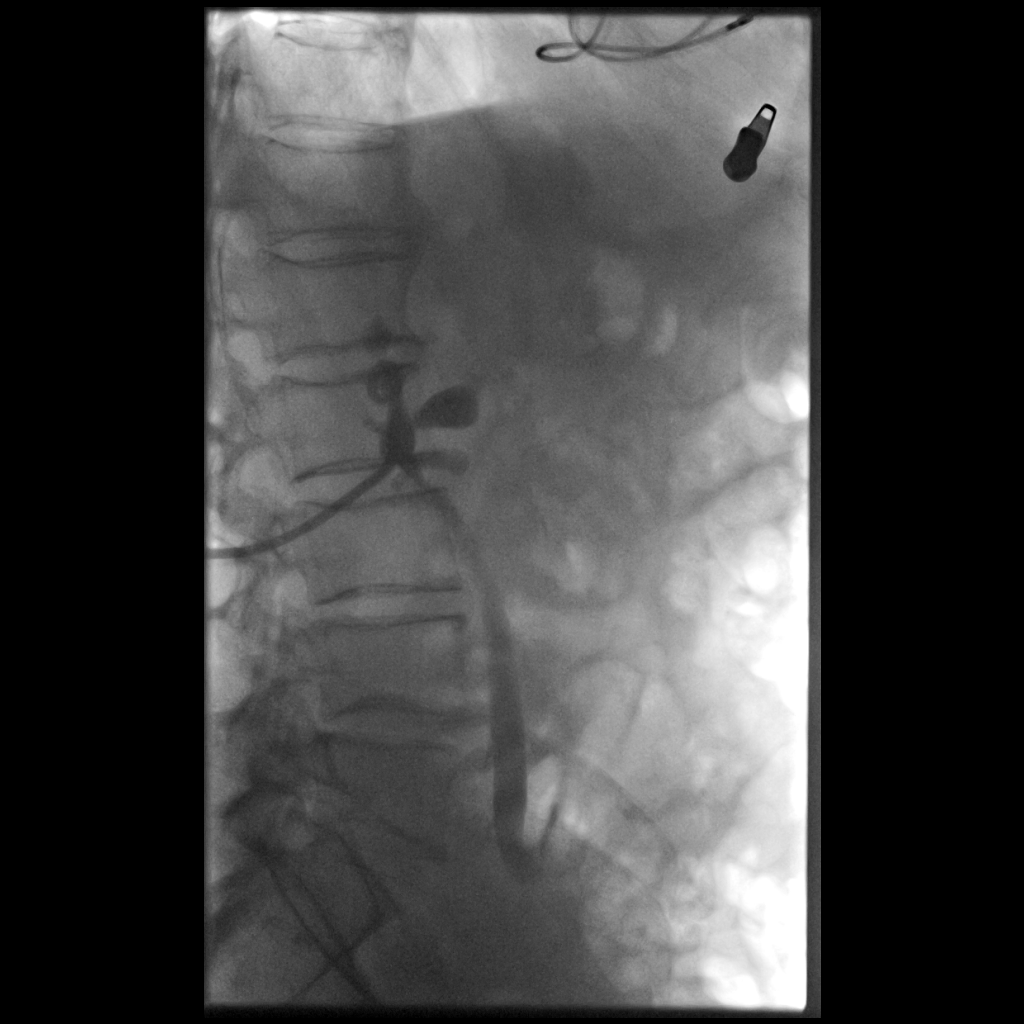
[im 2/3]
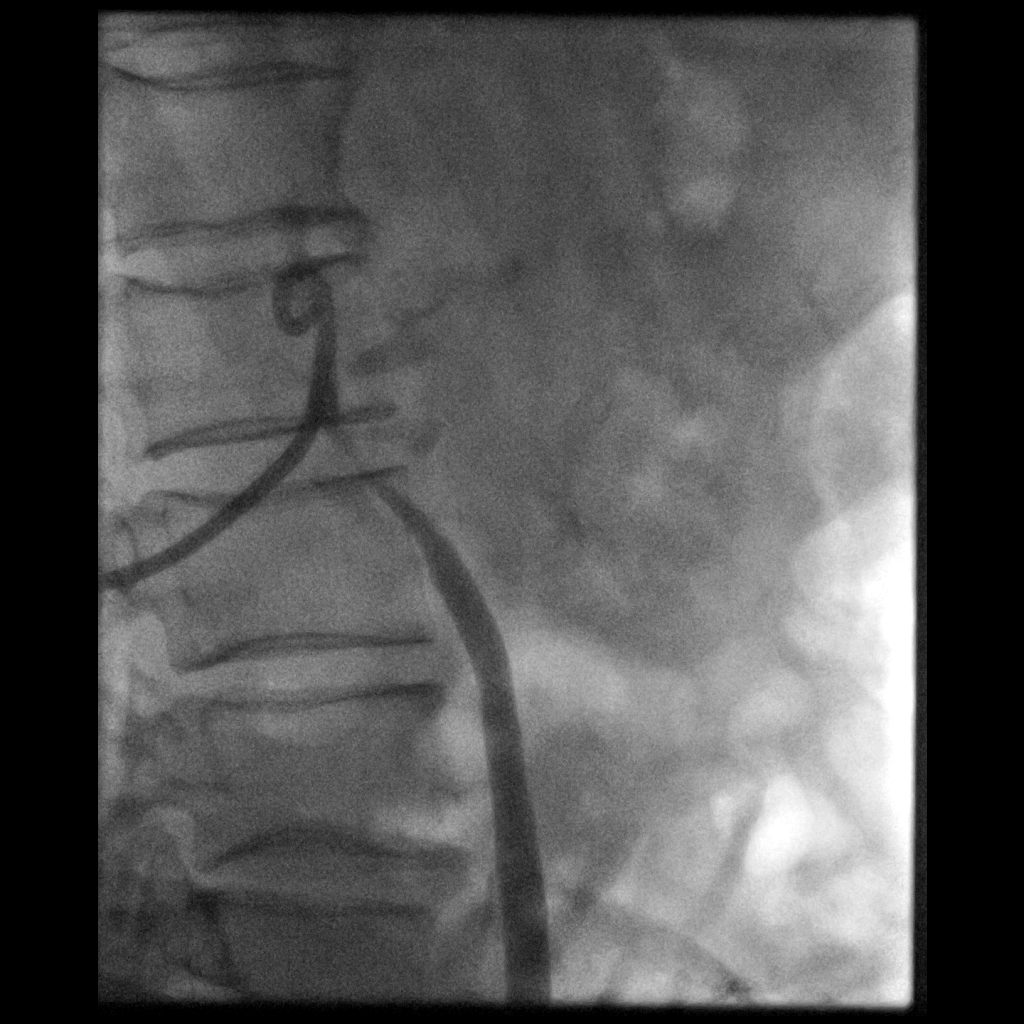
[im 3/3]
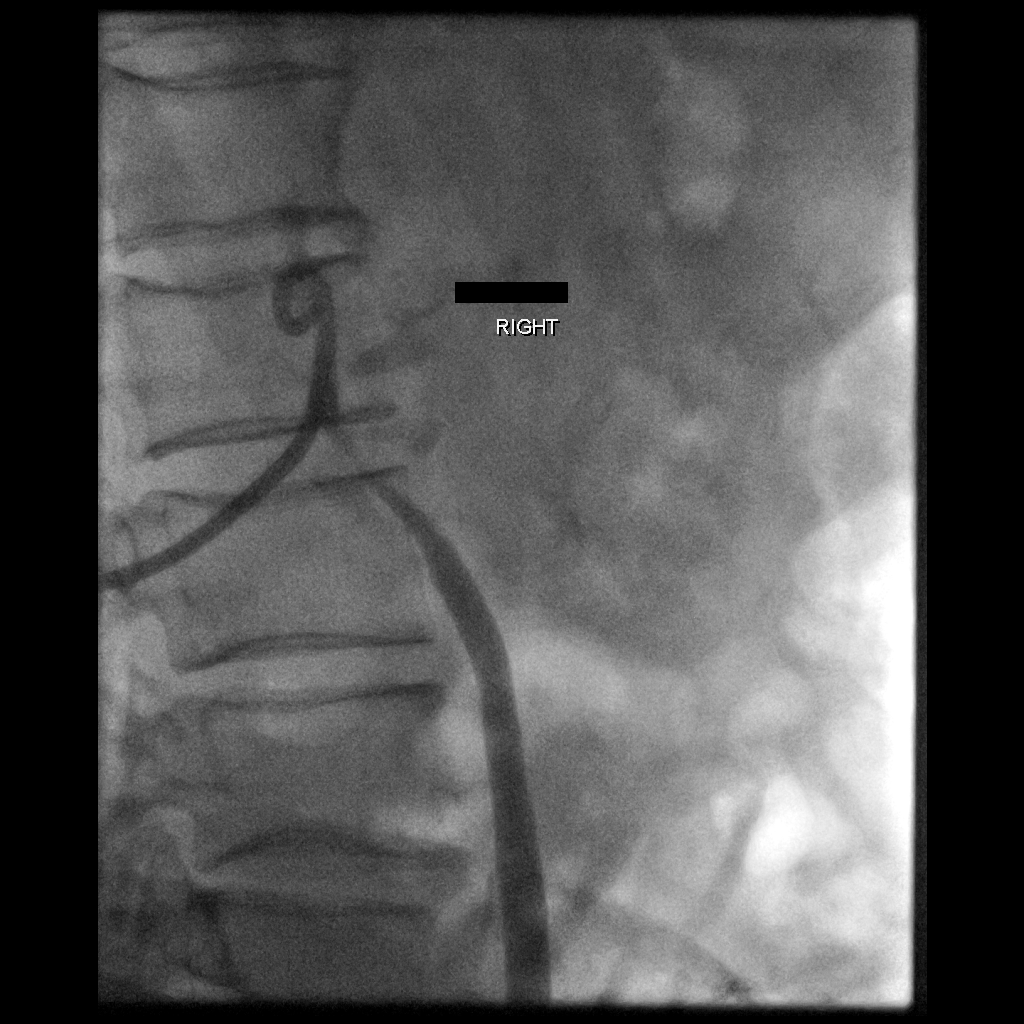

[3 of 3 positions shown; findings below may reference images not displayed]

MEDICATIONS:
None

ANESTHESIA/SEDATION:
None

CONTRAST:  10 mL Omnipaque 300-administered into the collecting
system(s)

FLUOROSCOPY TIME:  Fluoroscopy Time: 2 minutes 6 seconds (151 mGy).

COMPLICATIONS:
None immediate.

PROCEDURE:
Informed written consent was obtained from the patient after a
thorough discussion of the procedural risks, benefits and
alternatives. All questions were addressed. Maximal Sterile Barrier
Technique was utilized including caps, mask, sterile gowns, sterile
gloves, sterile drape, hand hygiene and skin antiseptic. A timeout
was performed prior to the initiation of the procedure.

Contrast was injected through the existing percutaneous nephrostomy
tube. No evidence of hydronephrosis. Persistent distal ureteral
occlusion. Renal collecting system is again noted to be very small.
The existing Par[place]ur drainage catheter was transected and
removed over an Amplatz wire. A new 10.2 Quirijn Amazigh
drainage catheter was advanced over the wire and formed in the renal
pelvis. The patient tolerated the procedure well.
IMPRESSION: Successful routine exchange of 10.2 Quirijn Amazigh drainage
catheter.

## 2022-02-07 IMAGING — XA IR EXCHANGE NEPHROSTOMY RIGHT
3 series · 10 of 10 positions shown · non-contrast
Comparison: 03/10/2020

INDICATION: Chronic indwelling right nephrostomy, routine exchange

EXAM:
FLUOROSCOPIC RIGHT NEPHROSTOMY EXCHANGE

[Series 1: fl - angio · 4 of 13 frames shown (1 of 2)]
[frame 2/13]
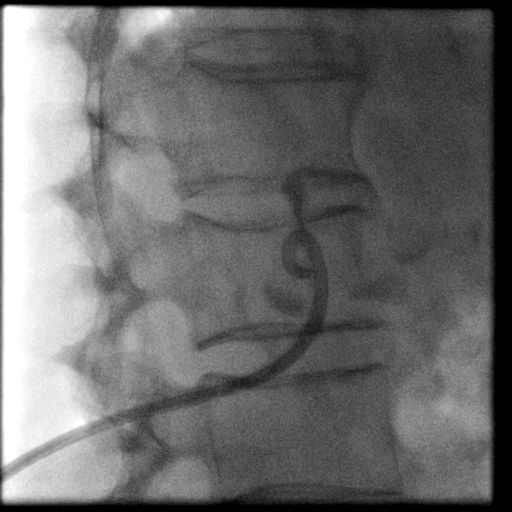
[frame 7/13]
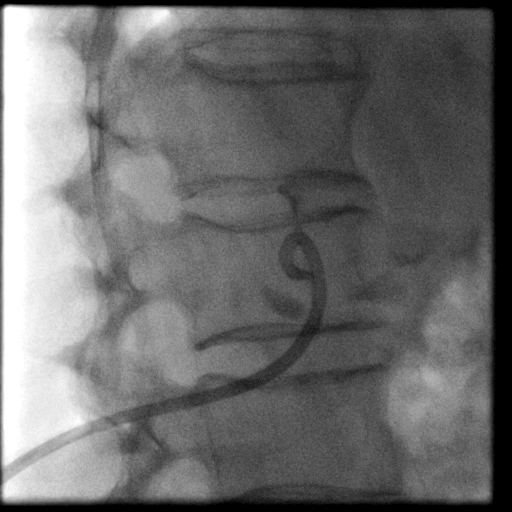
[frame 12/13]
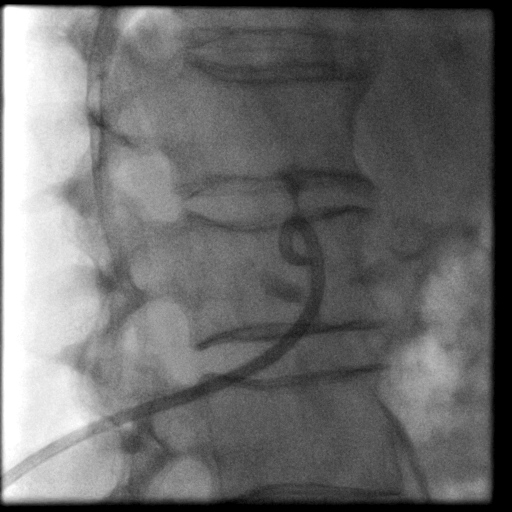
[frame 13/13]
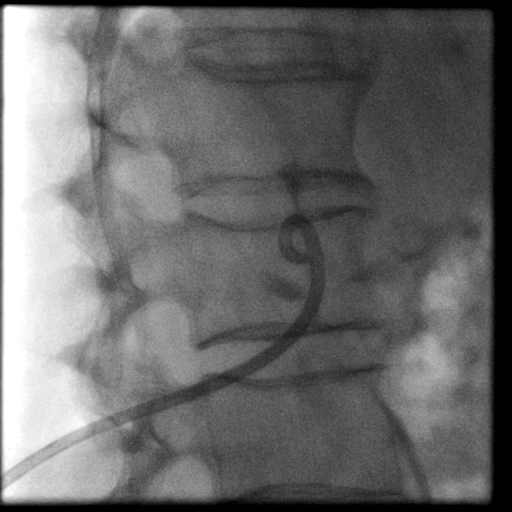

[Series 2: fl - angio · 4 of 45 frames shown (2 of 2)]
[frame 7/45]
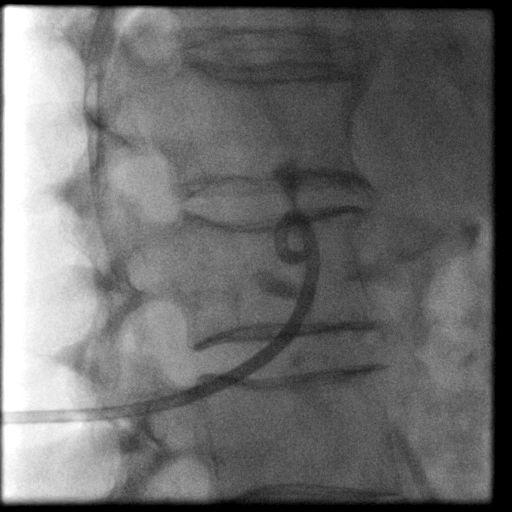
[frame 15/45]
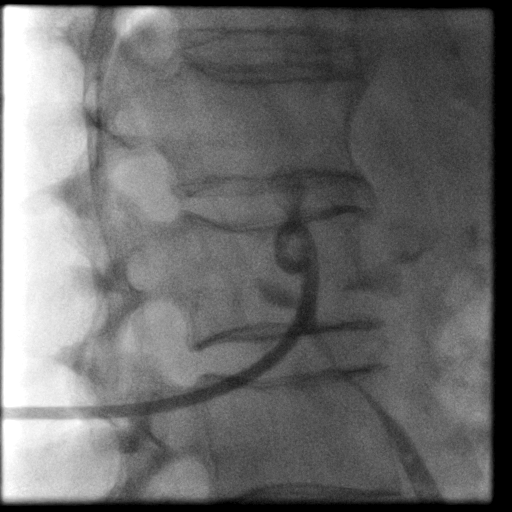
[frame 23/45]
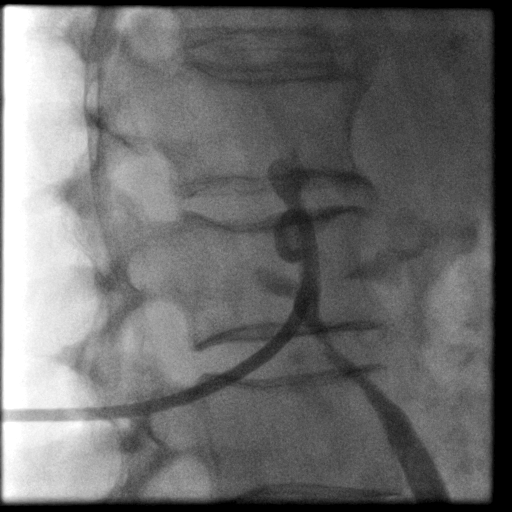
[frame 39/45]
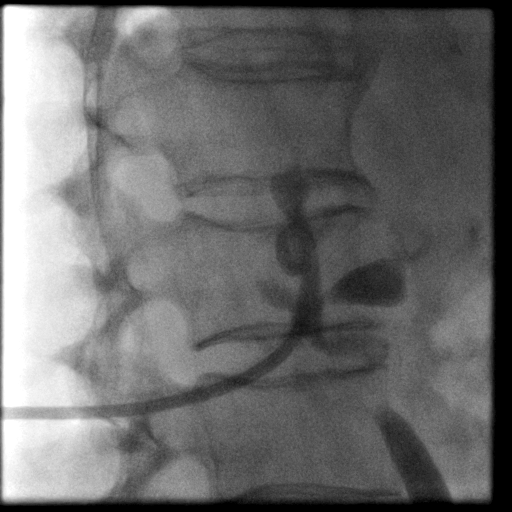

[Series 300: tube placements · 2 of 2 slices shown]
[im 1/2]
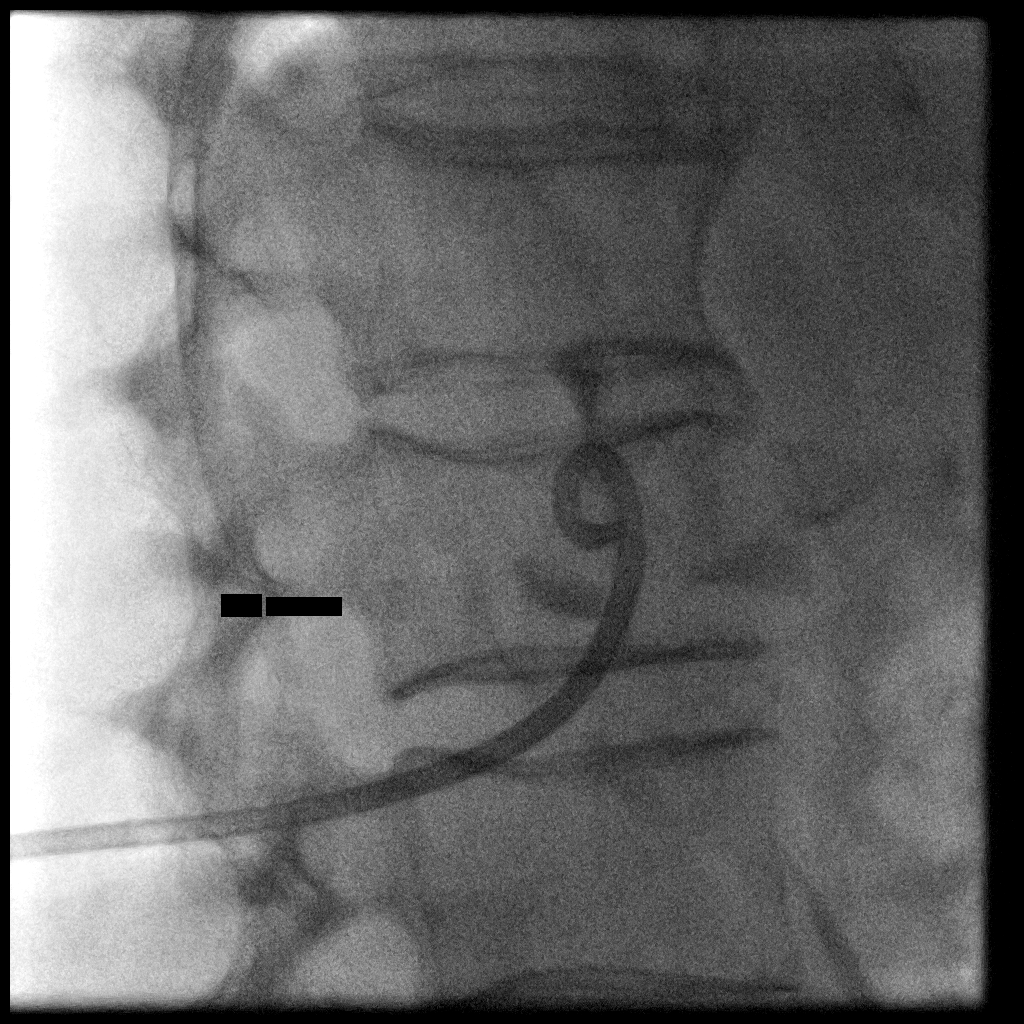
[im 2/2]
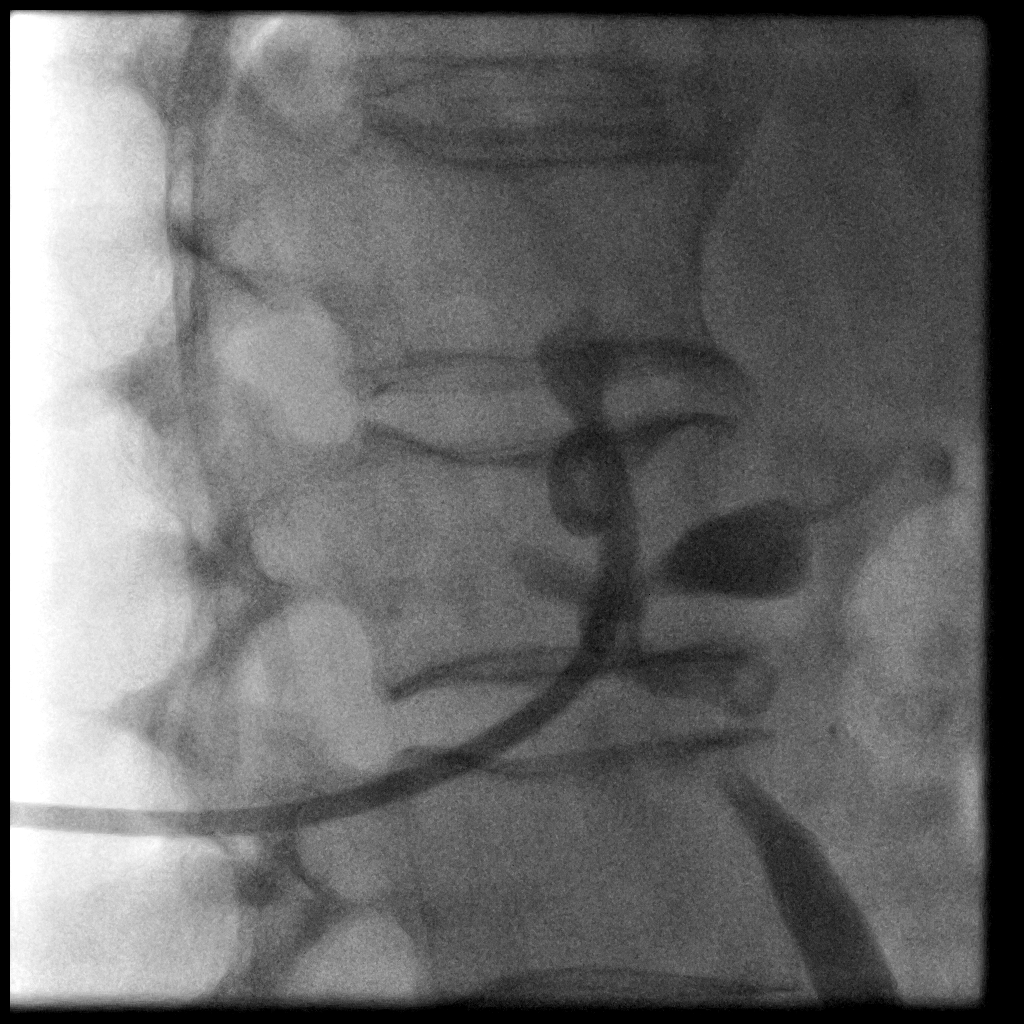

[10 of 10 positions shown; findings below may reference images not displayed]

MEDICATIONS:
NONE.

ANESTHESIA/SEDATION:
Moderate Sedation Time:  NONE.

The patient was continuously monitored during the procedure by the
interventional radiology nurse under my direct supervision.

CONTRAST:  10 CC-administered into the collecting system(s)

FLUOROSCOPY TIME:  Fluoroscopy Time: 2 minutes 24 seconds (2 mGy).

COMPLICATIONS:
None immediate.

PROCEDURE:
Informed written consent was obtained from the PATIENT'S DAUGHTER
after a thorough discussion of the procedural risks, benefits and
alternatives. All questions were addressed. Maximal Sterile Barrier
Technique was utilized including caps, mask, sterile gowns, sterile
gloves, sterile drape, hand hygiene and skin antiseptic. A timeout
was performed prior to the initiation of the procedure.

Initially the right nephrostomy catheter was injected with contrast
confirming position within the upper aspect of renal pelvis.
Catheter was successfully exchanged over a Amplatz guidewire. Randal
Locklear type catheter utilized with the small pigtail formed in the
collecting system. Images obtained for documentation. No immediate
complication. Patient tolerated the procedure well
IMPRESSION: Successful fluoroscopic 10 French right nephrostomy exchange (Randal
Locklear type catheter).

## 2022-06-25 IMAGING — XA IR EXCHANGE NEPHROSTOMY RIGHT
2 series · 9 of 9 positions shown · non-contrast
Comparison: Prior exchange 07/19/2020

INDICATION: [AGE] male with chronic indwelling right Klpigbb Moolman
percutaneous nephrostomy tube. Routine exchange.

EXAM:
Nephrostomy tube exchange

[Series 1: fl - angio · 4 of 39 frames shown]
[frame 1/39]
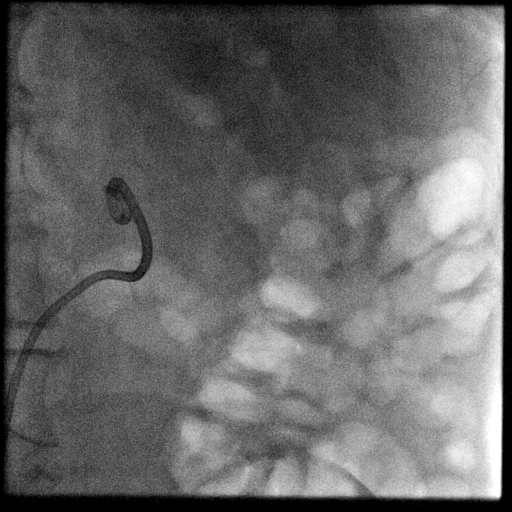
[frame 6/39]
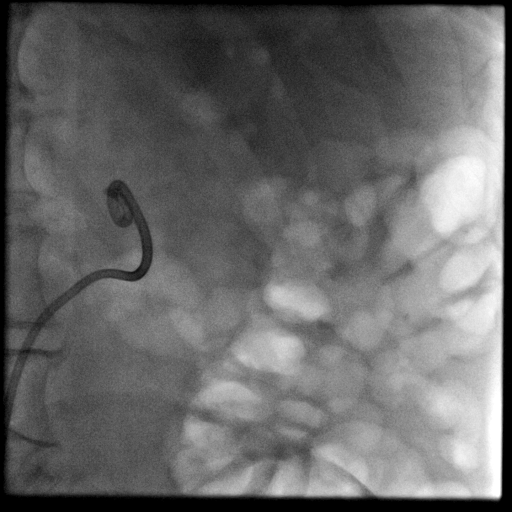
[frame 20/39]
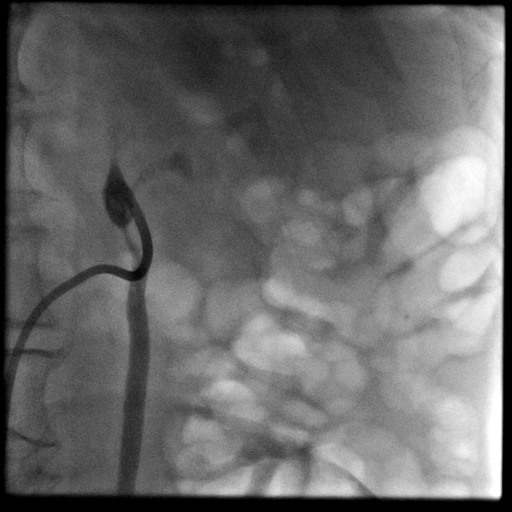
[frame 34/39]
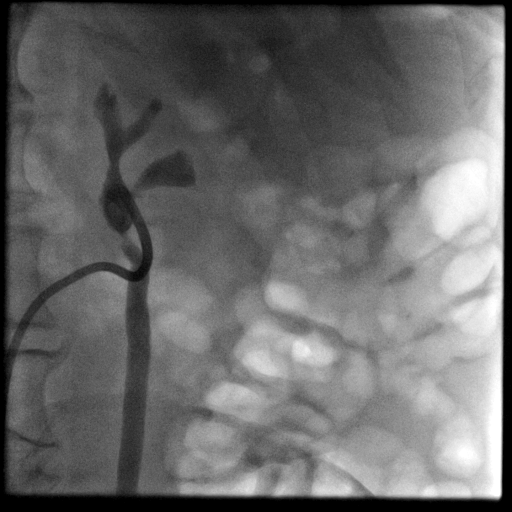

[Series 300: tube placements · 5 of 5 slices shown]
[im 1/5]
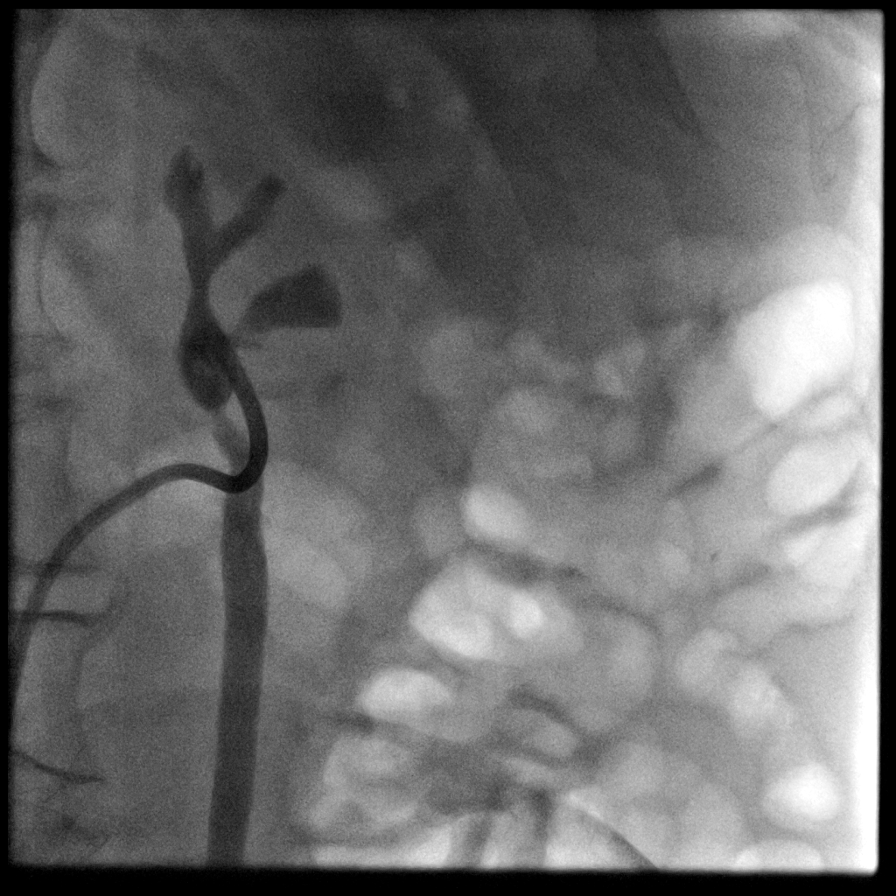
[im 2/5]
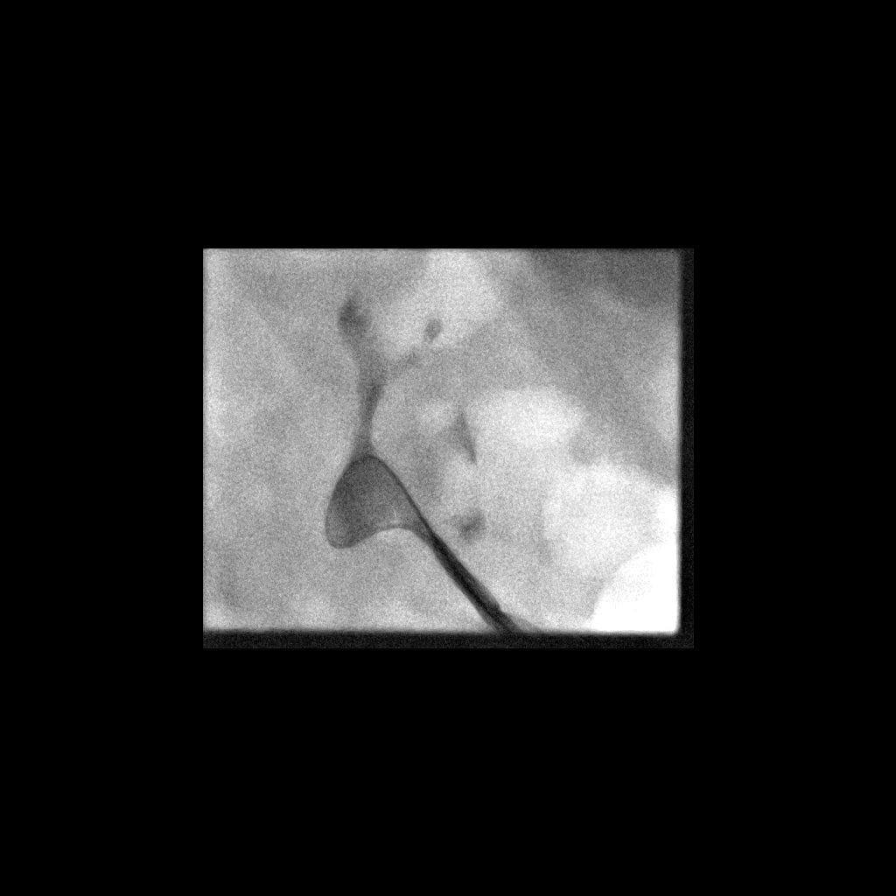
[im 3/5]
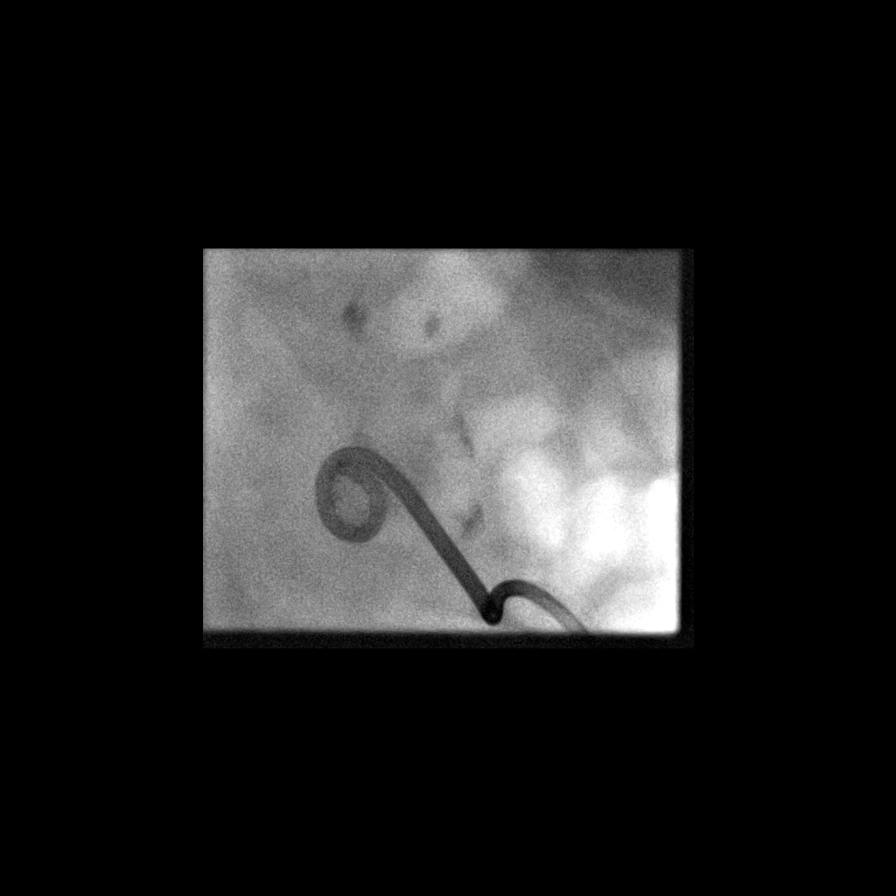
[im 4/5]
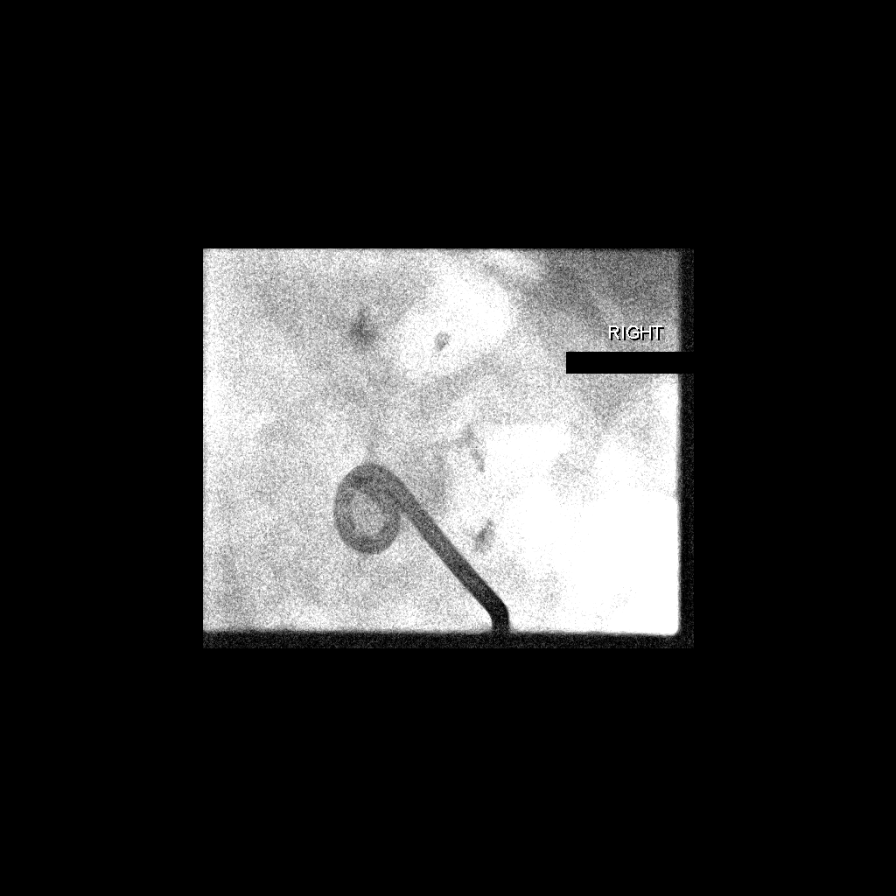
[im 5/5]
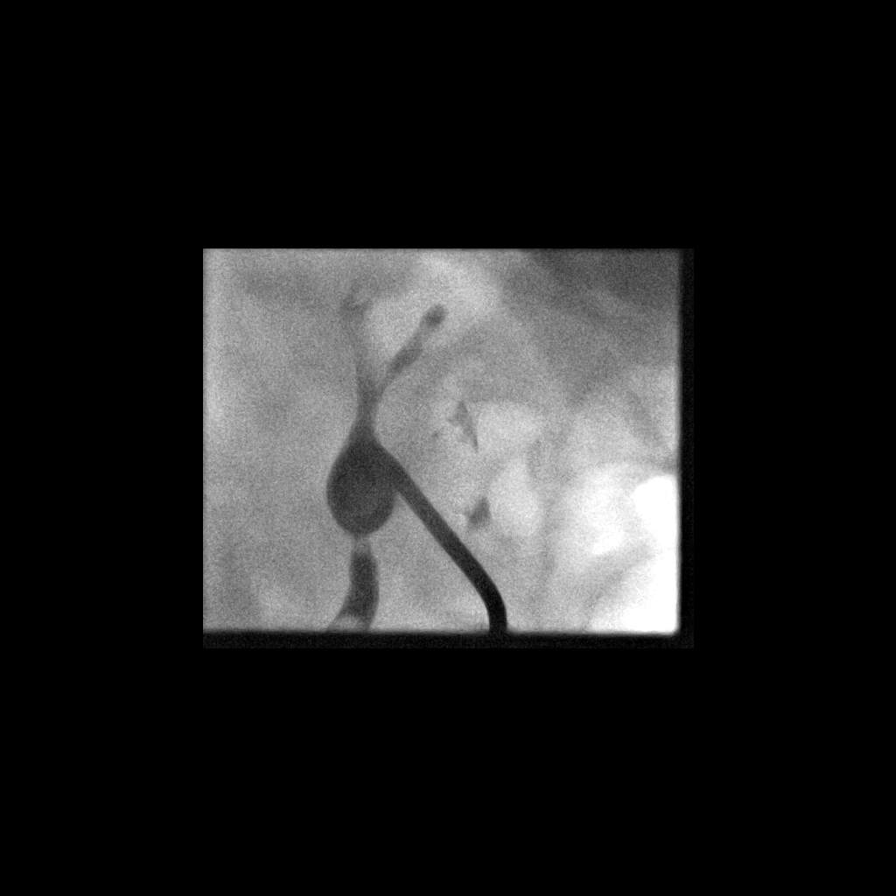

[9 of 9 positions shown; findings below may reference images not displayed]

MEDICATIONS:
None

ANESTHESIA/SEDATION:
None

CONTRAST:  8 mL Omnipaque 300-administered into the collecting
system(s)

FLUOROSCOPY TIME:  Fluoroscopy Time: 1 minutes 6 seconds (30 mGy).

COMPLICATIONS:
None immediate.

PROCEDURE:
Informed written consent was obtained from the patient after a
thorough discussion of the procedural risks, benefits and
alternatives. All questions were addressed. Maximal Sterile Barrier
Technique was utilized including caps, mask, sterile gowns, sterile
gloves, sterile drape, hand hygiene and skin antiseptic. A timeout
was performed prior to the initiation of the procedure.

Contrast injection was performed through the existing tube. The tube
is well positioned in the diminutive renal pelvis. The tube was
transected and removed over an Amplatz wire. Taesik Langone 10.2 Jhonny Franklin
Klpigbb Moolman drainage catheter was then advanced over the wire and
carefully formed in the renal pelvis. Repeat contrast injection was
performed confirming accurate placement of the tube within the renal
pelvis.

The tube was connected to gravity bag drainage.
IMPRESSION: Successful routine exchange of 10.2 Andrez Vince
percutaneous nephrostomy tube.

## 2022-08-18 IMAGING — XA IR EXCHANGE NEPHROSTOMY RIGHT
1 series · 5 of 5 positions shown · non-contrast
Comparison: 09/06/2020

INDICATION: [AGE] male with chronic indwelling right nephrostomy tube.
Presents for routine check and exchange

EXAM:
FLUOROSCOPIC GUIDED RIGHT SIDED NEPHROSTOMY CATHETER EXCHANGE
TECHNIQUE: Informed written consent was obtained from the patient after a
discussion of the risks, benefits and alternatives to treatment.
Questions regarding the procedure were encouraged and answered. A
timeout was performed prior to the initiation of the procedure.

[Series 300: ir nephrostomy exchange right · 5 of 5 slices shown]
[im 1/5]
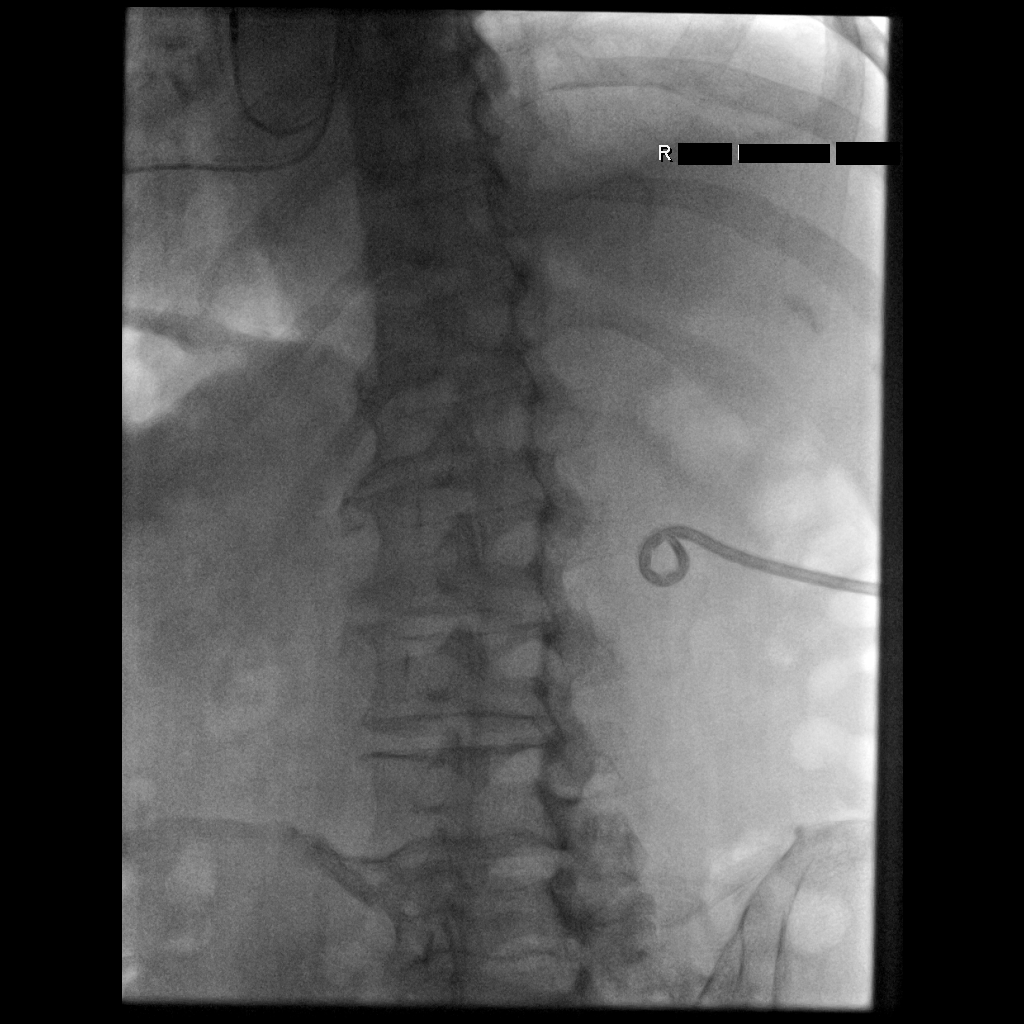
[im 2/5]
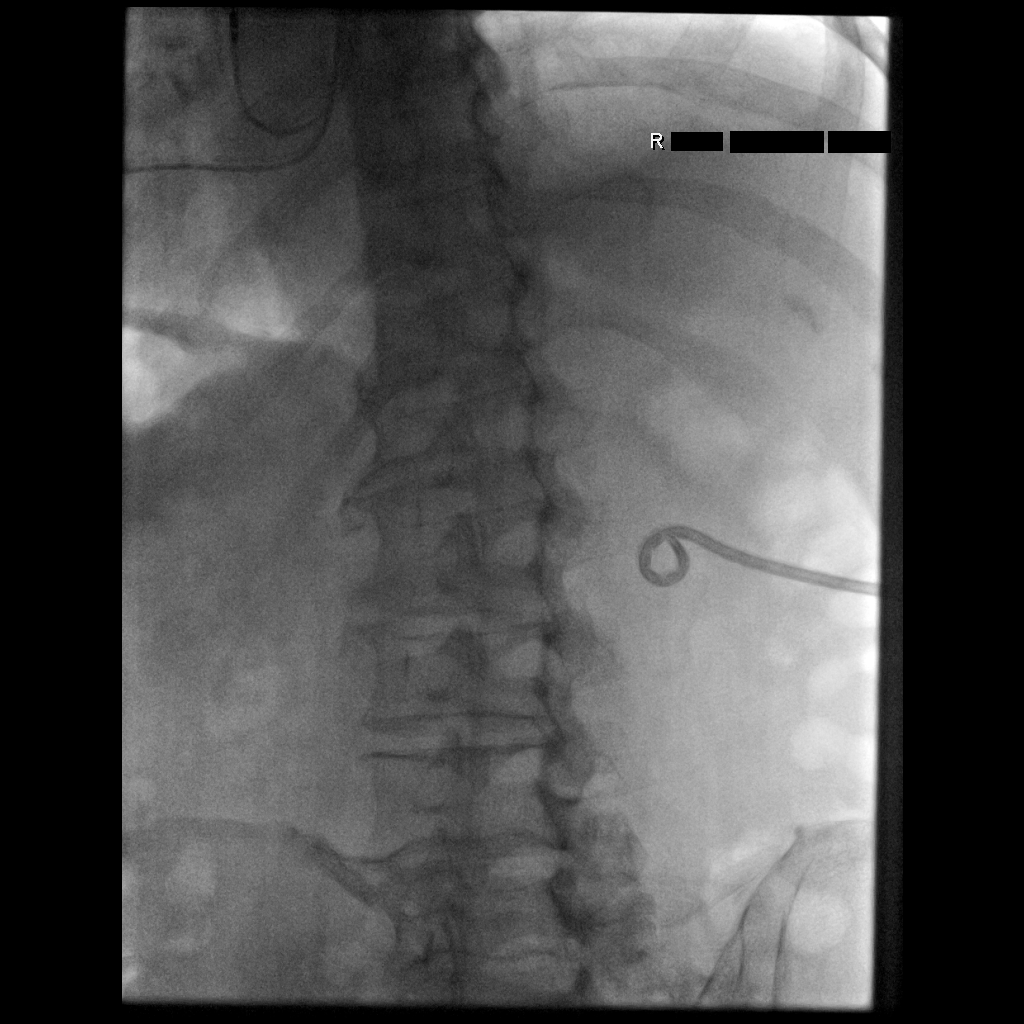
[im 3/5]
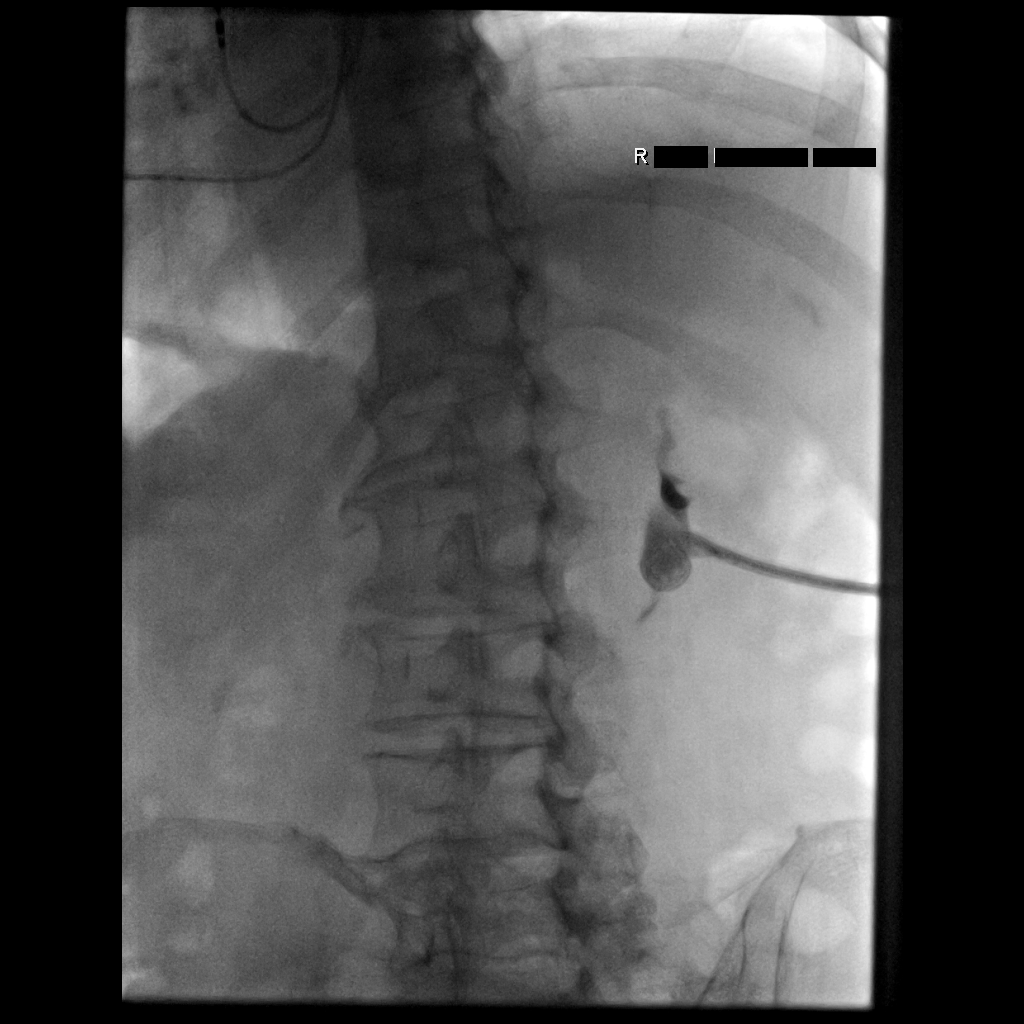
[im 4/5]
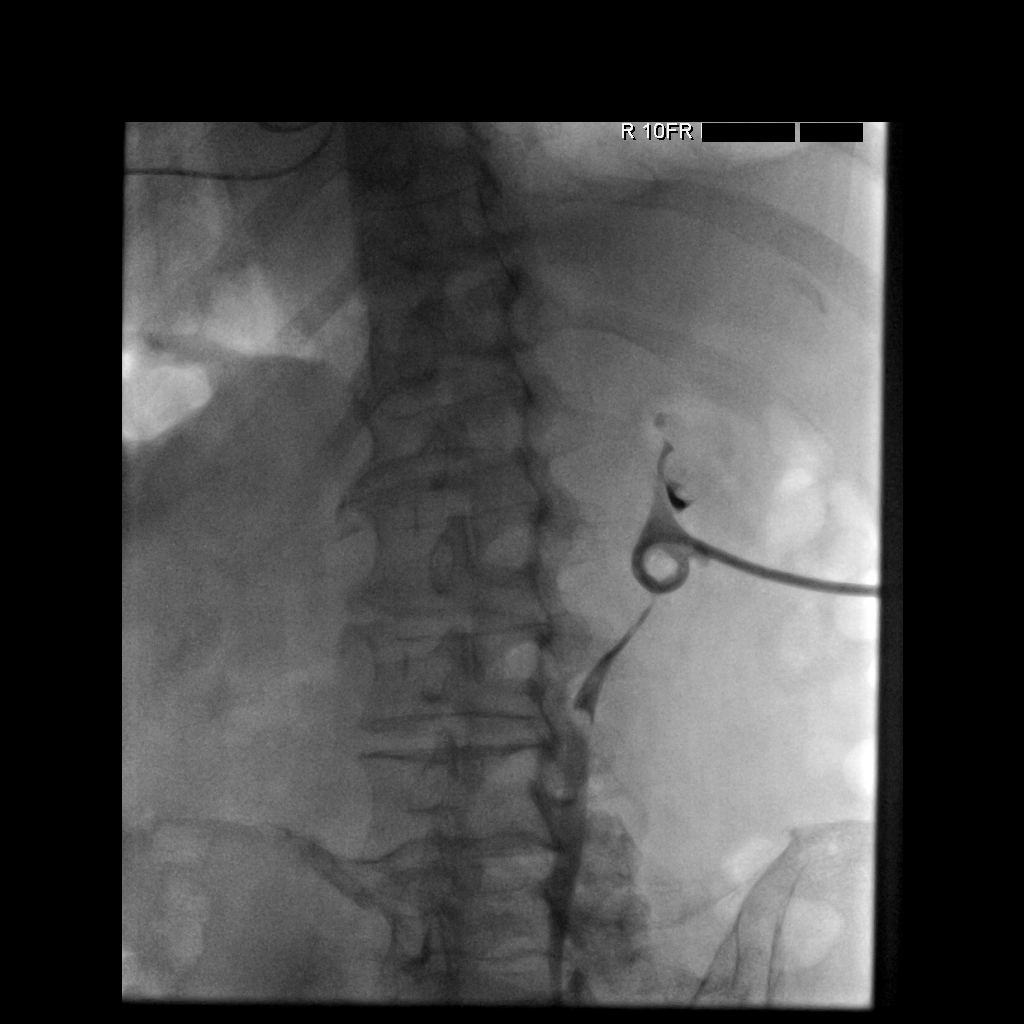
[im 5/5]
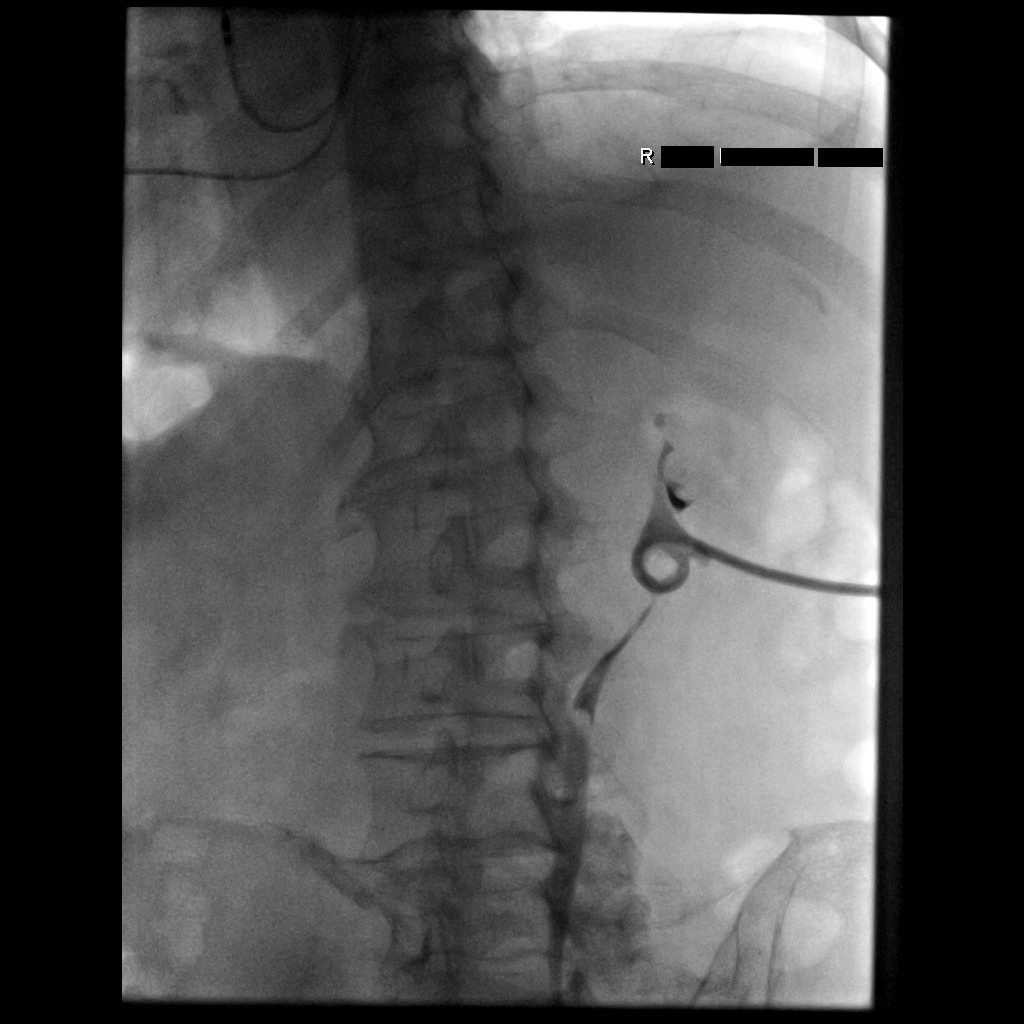

[5 of 5 positions shown; findings below may reference images not displayed]

CONTRAST:  A total of 10 mL Psovue-[id number] administered was administered
into the collecting system

FLUOROSCOPY TIME:  0 minutes 30 seconds, 2 mGy

COMPLICATIONS:
None immediate.
The right flank and external portions of existing nephrostomy
catheter was prepped and draped in the usual sterile fashion. A
sterile drape was applied covering the operative field. Maximum
barrier sterile technique with sterile gowns and gloves were used
for the procedure. A timeout was performed prior to the initiation
of the procedure.

A pre procedural spot fluoroscopic image was obtained. A small
amount of contrast was injected via the existing nephrostomy
catheter demonstrating appropriate positioning within the renal
pelvis. The existing nephrostomy catheter was cut and cannulated
with an Amplatz wire which was coiled within the renal pelvis. Under
intermittent fluoroscopic guidance, the existing nephrostomy
catheter was exchanged for a new 10.2 Wonder Immaculate drainage
catheter. Limited contrast injection confirmed appropriate
positioning within the renal pelvis and a post exchange fluoroscopic
image was obtained. The catheter was locked, secured to the skin
with an interrupted suture and reconnected to a gravity bag. A
dressing was placed. The patient tolerated the procedure well
without immediate postprocedural complication.
FINDINGS: The existing nephrostomy catheter is appropriately positioned and
functioning.

After successful fluoroscopic guided exchange, a new 3I.8Erench
Rhuan Koch nephrostomy catheter is coiled and locked within the
renal pelvis.
IMPRESSION: Successful fluoroscopic guided exchange of right 10.2 Leola Pantaleon
Yamira percutaneous nephrostomy catheter.
